# Patient Record
Sex: Female | Born: 1935 | Race: White | Hispanic: No
Health system: Southern US, Community
[De-identification: ages and names within clinical notes are randomized; demographics above are authoritative.]

## PROBLEM LIST (undated history)

## (undated) ENCOUNTER — Emergency Department (HOSPITAL_COMMUNITY): Admission: EM | Payer: Medicare Other | Source: Home / Self Care

## (undated) DIAGNOSIS — R55 Syncope and collapse: Secondary | ICD-10-CM

## (undated) DIAGNOSIS — E042 Nontoxic multinodular goiter: Secondary | ICD-10-CM

## (undated) DIAGNOSIS — M1711 Unilateral primary osteoarthritis, right knee: Secondary | ICD-10-CM

## (undated) DIAGNOSIS — E039 Hypothyroidism, unspecified: Secondary | ICD-10-CM

## (undated) DIAGNOSIS — K219 Gastro-esophageal reflux disease without esophagitis: Secondary | ICD-10-CM

## (undated) DIAGNOSIS — C801 Malignant (primary) neoplasm, unspecified: Secondary | ICD-10-CM

## (undated) DIAGNOSIS — E063 Autoimmune thyroiditis: Secondary | ICD-10-CM

## (undated) DIAGNOSIS — G8929 Other chronic pain: Secondary | ICD-10-CM

## (undated) DIAGNOSIS — M7120 Synovial cyst of popliteal space [Baker], unspecified knee: Secondary | ICD-10-CM

## (undated) DIAGNOSIS — I10 Essential (primary) hypertension: Secondary | ICD-10-CM

## (undated) DIAGNOSIS — M1990 Unspecified osteoarthritis, unspecified site: Secondary | ICD-10-CM

## (undated) DIAGNOSIS — M19011 Primary osteoarthritis, right shoulder: Secondary | ICD-10-CM

## (undated) DIAGNOSIS — J189 Pneumonia, unspecified organism: Secondary | ICD-10-CM

## (undated) DIAGNOSIS — R569 Unspecified convulsions: Secondary | ICD-10-CM

## (undated) DIAGNOSIS — Z9289 Personal history of other medical treatment: Secondary | ICD-10-CM

## (undated) DIAGNOSIS — D649 Anemia, unspecified: Secondary | ICD-10-CM

## (undated) DIAGNOSIS — M542 Cervicalgia: Secondary | ICD-10-CM

## (undated) DIAGNOSIS — E785 Hyperlipidemia, unspecified: Secondary | ICD-10-CM

## (undated) DIAGNOSIS — M549 Dorsalgia, unspecified: Secondary | ICD-10-CM

## (undated) DIAGNOSIS — I779 Disorder of arteries and arterioles, unspecified: Secondary | ICD-10-CM

## (undated) DIAGNOSIS — I951 Orthostatic hypotension: Secondary | ICD-10-CM

## (undated) HISTORY — PX: ABDOMINAL HYSTERECTOMY: SHX81

## (undated) HISTORY — DX: Anemia, unspecified: D64.9

## (undated) HISTORY — PX: COLONOSCOPY: SHX174

## (undated) HISTORY — PX: PROLAPSED UTERINE FIBROID LIGATION: SHX5400

## (undated) HISTORY — DX: Orthostatic hypotension: I95.1

## (undated) HISTORY — DX: Nontoxic multinodular goiter: E04.2

## (undated) HISTORY — DX: Syncope and collapse: R55

## (undated) HISTORY — DX: Gastro-esophageal reflux disease without esophagitis: K21.9

## (undated) HISTORY — PX: SHOULDER SURGERY: SHX246

## (undated) HISTORY — PX: CATARACT EXTRACTION: SUR2

## (undated) HISTORY — DX: Disorder of arteries and arterioles, unspecified: I77.9

## (undated) HISTORY — DX: Autoimmune thyroiditis: E06.3

## (undated) HISTORY — DX: Hyperlipidemia, unspecified: E78.5

## (undated) HISTORY — DX: Essential (primary) hypertension: I10

## (undated) HISTORY — DX: Unspecified osteoarthritis, unspecified site: M19.90

## (undated) HISTORY — PX: EYE SURGERY: SHX253

---

## 1989-08-31 DIAGNOSIS — Z9289 Personal history of other medical treatment: Secondary | ICD-10-CM

## 1989-08-31 HISTORY — DX: Personal history of other medical treatment: Z92.89

## 2001-05-19 ENCOUNTER — Encounter (HOSPITAL_COMMUNITY): Admission: RE | Admit: 2001-05-19 | Discharge: 2001-06-18 | Payer: Self-pay | Admitting: Family Medicine

## 2001-08-11 ENCOUNTER — Other Ambulatory Visit: Admission: RE | Admit: 2001-08-11 | Discharge: 2001-08-11 | Payer: Self-pay | Admitting: *Deleted

## 2003-04-22 ENCOUNTER — Other Ambulatory Visit: Admission: RE | Admit: 2003-04-22 | Discharge: 2003-04-22 | Payer: Self-pay | Admitting: Dermatology

## 2005-12-31 DIAGNOSIS — R55 Syncope and collapse: Secondary | ICD-10-CM

## 2005-12-31 HISTORY — DX: Syncope and collapse: R55

## 2006-08-20 ENCOUNTER — Ambulatory Visit: Payer: Self-pay | Admitting: Cardiology

## 2006-10-01 ENCOUNTER — Ambulatory Visit: Payer: Self-pay | Admitting: Cardiology

## 2007-08-15 ENCOUNTER — Emergency Department (HOSPITAL_COMMUNITY): Admission: EM | Admit: 2007-08-15 | Discharge: 2007-08-15 | Payer: Self-pay | Admitting: *Deleted

## 2008-09-10 ENCOUNTER — Emergency Department (HOSPITAL_COMMUNITY): Admission: EM | Admit: 2008-09-10 | Discharge: 2008-09-10 | Payer: Self-pay | Admitting: Emergency Medicine

## 2008-09-17 ENCOUNTER — Ambulatory Visit (HOSPITAL_COMMUNITY): Admission: RE | Admit: 2008-09-17 | Discharge: 2008-09-17 | Payer: Self-pay | Admitting: Family Medicine

## 2008-10-05 ENCOUNTER — Ambulatory Visit (HOSPITAL_COMMUNITY): Admission: RE | Admit: 2008-10-05 | Discharge: 2008-10-05 | Payer: Self-pay | Admitting: Family Medicine

## 2008-10-06 ENCOUNTER — Encounter: Admission: RE | Admit: 2008-10-06 | Discharge: 2008-10-06 | Payer: Self-pay | Admitting: Endocrinology

## 2008-10-06 ENCOUNTER — Encounter (INDEPENDENT_AMBULATORY_CARE_PROVIDER_SITE_OTHER): Payer: Self-pay | Admitting: Interventional Radiology

## 2008-10-06 ENCOUNTER — Other Ambulatory Visit: Admission: RE | Admit: 2008-10-06 | Discharge: 2008-10-06 | Payer: Self-pay | Admitting: Interventional Radiology

## 2008-12-31 DIAGNOSIS — E042 Nontoxic multinodular goiter: Secondary | ICD-10-CM

## 2008-12-31 HISTORY — DX: Nontoxic multinodular goiter: E04.2

## 2008-12-31 HISTORY — PX: TOTAL THYROIDECTOMY: SHX2547

## 2009-02-17 ENCOUNTER — Encounter: Payer: Self-pay | Admitting: Cardiology

## 2009-02-17 LAB — CONVERTED CEMR LAB
ALT: 17 units/L
Albumin: 3.3 g/dL
BUN: 16 mg/dL
Chloride: 105 meq/L
Glucose, Bld: 119 mg/dL
HCT: 34.8 %
Platelets: 235 10*3/uL
Total Protein: 5.9 g/dL

## 2009-02-22 ENCOUNTER — Encounter: Admission: RE | Admit: 2009-02-22 | Discharge: 2009-02-22 | Payer: Self-pay | Admitting: Surgery

## 2009-02-25 ENCOUNTER — Encounter (INDEPENDENT_AMBULATORY_CARE_PROVIDER_SITE_OTHER): Payer: Self-pay | Admitting: General Surgery

## 2009-02-25 ENCOUNTER — Observation Stay (HOSPITAL_COMMUNITY): Admission: RE | Admit: 2009-02-25 | Discharge: 2009-02-26 | Payer: Self-pay | Admitting: General Surgery

## 2009-03-25 ENCOUNTER — Encounter (INDEPENDENT_AMBULATORY_CARE_PROVIDER_SITE_OTHER): Payer: Self-pay

## 2009-03-25 LAB — CONVERTED CEMR LAB
AST: 14 units/L
Albumin: 3.7 g/dL
CO2: 27 meq/L
Chloride: 105 meq/L
Free T4: 0.96 ng/dL
T3, Free: 2.2 pg/mL
Total Protein: 6.6 g/dL

## 2009-05-23 ENCOUNTER — Encounter (INDEPENDENT_AMBULATORY_CARE_PROVIDER_SITE_OTHER): Payer: Self-pay

## 2009-05-23 LAB — CONVERTED CEMR LAB
T3, Free: 2.85 pg/mL
TSH: 0.4 microintl units/mL

## 2009-09-22 ENCOUNTER — Encounter (INDEPENDENT_AMBULATORY_CARE_PROVIDER_SITE_OTHER): Payer: Self-pay | Admitting: *Deleted

## 2009-09-30 ENCOUNTER — Ambulatory Visit (HOSPITAL_COMMUNITY): Admission: RE | Admit: 2009-09-30 | Discharge: 2009-09-30 | Payer: Self-pay | Admitting: Family Medicine

## 2009-10-03 ENCOUNTER — Encounter (INDEPENDENT_AMBULATORY_CARE_PROVIDER_SITE_OTHER): Payer: Self-pay

## 2009-10-03 LAB — CONVERTED CEMR LAB: Vitamin B-12: 364 pg/mL

## 2009-10-13 ENCOUNTER — Ambulatory Visit: Payer: Self-pay | Admitting: Gastroenterology

## 2009-10-13 DIAGNOSIS — R141 Gas pain: Secondary | ICD-10-CM

## 2009-10-13 DIAGNOSIS — R142 Eructation: Secondary | ICD-10-CM

## 2009-10-13 DIAGNOSIS — R1013 Epigastric pain: Secondary | ICD-10-CM | POA: Insufficient documentation

## 2009-10-13 DIAGNOSIS — R143 Flatulence: Secondary | ICD-10-CM

## 2009-11-10 ENCOUNTER — Telehealth (INDEPENDENT_AMBULATORY_CARE_PROVIDER_SITE_OTHER): Payer: Self-pay

## 2009-11-18 ENCOUNTER — Encounter: Payer: Self-pay | Admitting: Gastroenterology

## 2009-11-21 LAB — CONVERTED CEMR LAB
Basophils Relative: 1 % (ref 0–1)
Eosinophils Relative: 6 % — ABNORMAL HIGH (ref 0–5)
Hemoglobin: 12 g/dL (ref 12.0–15.0)
Lymphocytes Relative: 24 % (ref 12–46)
Lymphs Abs: 2.2 10*3/uL (ref 0.7–4.0)
MCHC: 33.1 g/dL (ref 30.0–36.0)
MCV: 89.2 fL (ref 78.0–100.0)
Monocytes Absolute: 0.6 10*3/uL (ref 0.1–1.0)
Monocytes Relative: 7 % (ref 3–12)
Neutro Abs: 5.6 10*3/uL (ref 1.7–7.7)
Neutrophils Relative %: 62 % (ref 43–77)
WBC: 8.9 10*3/uL (ref 4.0–10.5)

## 2009-11-30 ENCOUNTER — Ambulatory Visit: Payer: Self-pay | Admitting: Gastroenterology

## 2009-11-30 HISTORY — PX: ESOPHAGOGASTRODUODENOSCOPY: SHX1529

## 2009-12-02 ENCOUNTER — Ambulatory Visit: Payer: Self-pay | Admitting: Gastroenterology

## 2009-12-02 ENCOUNTER — Ambulatory Visit (HOSPITAL_COMMUNITY): Admission: RE | Admit: 2009-12-02 | Discharge: 2009-12-02 | Payer: Self-pay | Admitting: Gastroenterology

## 2009-12-09 ENCOUNTER — Encounter: Payer: Self-pay | Admitting: Gastroenterology

## 2009-12-16 ENCOUNTER — Encounter: Payer: Self-pay | Admitting: Gastroenterology

## 2009-12-19 ENCOUNTER — Encounter: Payer: Self-pay | Admitting: Gastroenterology

## 2009-12-27 ENCOUNTER — Encounter: Payer: Self-pay | Admitting: Gastroenterology

## 2010-02-13 ENCOUNTER — Encounter (INDEPENDENT_AMBULATORY_CARE_PROVIDER_SITE_OTHER): Payer: Self-pay

## 2010-02-13 LAB — CONVERTED CEMR LAB
BUN: 21 mg/dL
Chloride: 105 meq/L
Free T4: 0.95 ng/dL
HCT: 36.5 %
Hemoglobin: 11.3 g/dL
Platelets: 268 10*3/uL
Saturation Ratios: 43 %
Sodium: 141 meq/L
TIBC: 272 ug/dL
UIBC: 190 ug/dL

## 2010-03-03 ENCOUNTER — Ambulatory Visit: Payer: Self-pay | Admitting: Gastroenterology

## 2010-05-08 ENCOUNTER — Ambulatory Visit: Payer: Self-pay | Admitting: Cardiology

## 2010-05-08 DIAGNOSIS — K219 Gastro-esophageal reflux disease without esophagitis: Secondary | ICD-10-CM

## 2010-05-08 DIAGNOSIS — J45909 Unspecified asthma, uncomplicated: Secondary | ICD-10-CM | POA: Insufficient documentation

## 2010-05-08 DIAGNOSIS — Z9089 Acquired absence of other organs: Secondary | ICD-10-CM | POA: Insufficient documentation

## 2010-05-09 ENCOUNTER — Encounter: Payer: Self-pay | Admitting: Cardiology

## 2010-05-16 ENCOUNTER — Encounter: Payer: Self-pay | Admitting: Cardiology

## 2010-06-05 ENCOUNTER — Encounter: Admission: RE | Admit: 2010-06-05 | Discharge: 2010-06-05 | Payer: Self-pay | Admitting: Orthopedic Surgery

## 2010-06-07 ENCOUNTER — Ambulatory Visit: Payer: Self-pay | Admitting: Gastroenterology

## 2010-06-07 DIAGNOSIS — R1319 Other dysphagia: Secondary | ICD-10-CM

## 2010-06-09 ENCOUNTER — Ambulatory Visit (HOSPITAL_COMMUNITY): Admission: RE | Admit: 2010-06-09 | Discharge: 2010-06-09 | Payer: Self-pay | Admitting: Gastroenterology

## 2010-06-15 ENCOUNTER — Encounter (INDEPENDENT_AMBULATORY_CARE_PROVIDER_SITE_OTHER): Payer: Self-pay

## 2010-06-18 ENCOUNTER — Emergency Department (HOSPITAL_COMMUNITY): Admission: EM | Admit: 2010-06-18 | Discharge: 2010-06-18 | Payer: Self-pay | Admitting: Emergency Medicine

## 2010-07-11 ENCOUNTER — Inpatient Hospital Stay (HOSPITAL_COMMUNITY): Admission: RE | Admit: 2010-07-11 | Discharge: 2010-07-13 | Payer: Self-pay | Admitting: Orthopedic Surgery

## 2010-08-31 ENCOUNTER — Encounter (HOSPITAL_COMMUNITY): Admission: RE | Admit: 2010-08-31 | Discharge: 2010-09-29 | Payer: Self-pay | Admitting: Orthopedic Surgery

## 2010-10-04 ENCOUNTER — Encounter (HOSPITAL_COMMUNITY)
Admission: RE | Admit: 2010-10-04 | Discharge: 2010-11-03 | Payer: Self-pay | Source: Home / Self Care | Admitting: Orthopedic Surgery

## 2010-11-07 ENCOUNTER — Ambulatory Visit: Payer: Self-pay | Admitting: Gastroenterology

## 2011-01-21 ENCOUNTER — Encounter: Payer: Self-pay | Admitting: General Surgery

## 2011-01-21 ENCOUNTER — Encounter: Payer: Self-pay | Admitting: Endocrinology

## 2011-02-01 NOTE — Assessment & Plan Note (Signed)
Summary: DYSPHAGIA, GERD   Visit Type:  Follow-up Visit Primary Care Provider:  Phillips Odor, M.D.  Chief Complaint:  dysphagia.  History of Present Illness: Swallowing is pretty good. Still is careful about swallowing. Works all the time. No vomiting. No blood in stool or black tarry stools. Daughter had a bug. Spent most of the day cleaning off things with Lysol. Feeling queasy since Sat  off and on. Has congestion and runny nose. No fever. BMs: 1-2x.day, no diarrhea. Uses Tums as needed for indigestion especially under stress. DEXILANT helps to control her Sx.  Allergies: 1)  ! Celebrex 2)  ! Augmentin  Past History:  Past Medical History: Last updated: 06/07/2010 Near syncope-2007 Hypertension Asthma Left shoulder pain, C-spine disease Arthritis Hiatal hernia/GERD; distal esophageal web requiring dilatation; gastric polyps; gastritis; refuses colonoscopy LYMPHOCYTIC THYROIDITIS AND ADENOMATOUS NODULES-thyroidectomy in 2010  Past Surgical History: Last updated: 05/08/2010 Hysterectomy for fibroids Repair of uterine prolapse/rectocele, cystocele Total thyroidectomy-2010  Social History: Marital Status: widowed, husband died in 11-13-09 HAS A HANDICAPPED DAUGHTER. 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 Head:  Normocephalic and atraumatic. Lungs:  Clear throughout to auscultation. Heart:  Regular rate and rhythm; no murmurs. Abdomen:  Soft, nontender and nondistended. Normal bowel sounds. Neurologic:  Alert and  oriented  x4;  grossly normal neurologically.  Impression & Recommendations:  Problem # 1:  OTHER DYSPHAGIA (ICD-787.29) Assessment Unchanged 2o to cervical disc disease. Pt modifying her diet. OPV IN 12 MOS.  Problem # 2:  GASTROESOPHAGEAL REFLUX DISEASE (ICD-530.81) Assessment: Unchanged Sx controlled. Refilled Dexilant for one year.  CC: PCP Prescriptions: DEXILANT 60 MG CPDR (DEXLANSOPRAZOLE) one by mouth every morning  #90 x 3   Entered and Authorized by:   West Bali MD   Signed by:   West Bali MD on 11/07/2010   Method used:   Print then Give to Patient   RxID:   [id number]   Appended Document: Orders Update    Clinical Lists Changes  Orders: Added new Service order of Est. Patient Level II (14782) - Signed      Appended Document: DYSPHAGIA, GERD 77YR OPV F/U IS IN THE COMPUTER

## 2011-02-01 NOTE — Letter (Signed)
Summary: BPE ORDER  BPE ORDER   Imported By: Ave Filter 06/07/2010 11:23:14  _____________________________________________________________________  External Attachment:    Type:   Image     Comment:   External Document

## 2011-02-01 NOTE — Miscellaneous (Signed)
**Note De-Identified Christine Leblanc Obfuscation** Summary: CBC, Iron, BMET, T4, T3, TSH and B12 (02-13-10, 10-03-09, 05-23-09   Clinical Lists Changes  Observations: Added new observation of IRON SATUR %: 43 % (02/13/2010 15:20) Added new observation of TIBC: 272 mcg/dL (41/32/4401 02:72) Added new observation of UIBC: 190 mcg/dL (53/66/4403 47:42) Added new observation of IRON: 82 mcg/dL (59/56/3875 64:33) Added new observation of CALCIUM: 8.9 mg/dL (29/51/8841 66:06) Added new observation of CO2 PLSM/SER: 29 meq/L (02/13/2010 15:20) Added new observation of CL SERUM: 105 meq/L (02/13/2010 15:20) Added new observation of K SERUM: 3.9 meq/L (02/13/2010 15:20) Added new observation of NA: 141 meq/L (02/13/2010 15:20) Added new observation of TSH: 2.98 microintl units/mL (02/13/2010 15:20) Added new observation of T4, FREE: 0.95 ng/dL (30/16/0109 32:35) Added new observation of T3 FREE: 2.27 pg/mL (02/13/2010 15:20) Added new observation of CREATININE: 0.7 mg/dL (57/32/2025 42:70) Added new observation of BUN: 21 mg/dL (62/37/6283 15:17) Added new observation of BG RANDOM: 110 mg/dL (61/60/7371 06:26) Added new observation of PLATELETK/UL: 268 K/uL (02/13/2010 15:20) Added new observation of MCV: 92.9 fL (02/13/2010 15:20) Added new observation of HCT: 36.5 % (02/13/2010 15:20) Added new observation of HGB: 11.3 g/dL (94/85/4627 03:50) Added new observation of TSH: 0.32 microintl units/mL (10/03/2009 15:20) Added new observation of T4, FREE: 1.25 ng/dL (09/38/1829 93:71) Added new observation of T3 FREE: 2.46 pg/mL (10/03/2009 15:20) Added new observation of B12: 364 pg/mL (10/03/2009 15:20) Added new observation of CREATININE: 0.7 mg/dL (69/67/8938 10:17) Added new observation of BUN: 21 mg/dL (51/01/5851 77:82) Added new observation of BG RANDOM: 119 mg/dL (42/35/3614 43:15) Added new observation of CO2 PLSM/SER: 24 meq/L (10/03/2009 15:20) Added new observation of CL SERUM: 105 meq/L (10/03/2009 15:20) Added new observation of K  SERUM: 3.9 meq/L (10/03/2009 15:20) Added new observation of NA: 141 meq/L (10/03/2009 15:20) Added new observation of TSH: 0.40 microintl units/mL (05/23/2009 15:20) Added new observation of T4, FREE: 1.53 ng/dL (40/07/6760 95:09) Added new observation of T3 FREE: 2.85 pg/mL (05/23/2009 15:20) Added new observation of CALCIUM: 9.0 mg/dL (32/67/1245 80:99) Added new observation of ALBUMIN: 3.7 g/dL (83/38/2505 39:76) Added new observation of PROTEIN, TOT: 6.6 g/dL (73/41/9379 02:40) Added new observation of SGPT (ALT): 12 units/L (03/25/2009 15:20) Added new observation of SGOT (AST): 14 units/L (03/25/2009 15:20) Added new observation of ALK PHOS: 53 units/L (03/25/2009 15:20) Added new observation of BILI DIRECT: BILI Total: 0.4 mg/dL (97/35/3299 24:26) Added new observation of CREATININE: 0.7 mg/dL (83/41/9622 29:79) Added new observation of BUN: 21 mg/dL (89/21/1941 74:08) Added new observation of BG RANDOM: 113 mg/dL (14/48/1856 31:49) Added new observation of CO2 PLSM/SER: 27 meq/L (03/25/2009 15:20) Added new observation of CL SERUM: 105 meq/L (03/25/2009 15:20) Added new observation of K SERUM: 3.8 meq/L (03/25/2009 15:20) Added new observation of NA: 141 meq/L (03/25/2009 15:20) Added new observation of TSH: 7.46 microintl units/mL (03/25/2009 15:20) Added new observation of T4, FREE: 0.96 ng/dL (70/26/3785 88:50) Added new observation of T3 FREE: 2.20 pg/mL (03/25/2009 15:20)

## 2011-02-01 NOTE — Letter (Signed)
Summary: [facility] PROGRESS NOTE   OPTHALMOLOGY PROGRESS NOTE   Imported By: Faythe Ghee 05/16/2010 16:39:56  _____________________________________________________________________  External Attachment:    Type:   Image     Comment:   External Document

## 2011-02-01 NOTE — Assessment & Plan Note (Signed)
Summary: F3Y-WANT TO BE CHECKED   Visit Type:  Follow-up Referring Provider:  . Primary Provider:  Nobie Leblanc   History of Present Illness: [name] is seen at her request after a hiatus of nearly 4 years for evaluation of left arm pain, EKG abnormalities and her concern regarding possible cerebrovascular disease.  She actually has done quite well with good exercise tolerance, no chest discomfort and no dyspnea.  During a recent evaluation by her ophthalmologist, she was asked about possible CVA and wondered whether that indicated that she had vascular disease.  She also has been told of concerns about her EKG in the past.  She has taken Premarin for decades and was told by her gynecologist that women who have done so can rarely discontinue it comfortably.  Her principal medical problem has been chronic discomfort in her left shoulder.  She has been evaluated by Dr. Thurston Hole, who has referred her to a shoulder specialist, Dr. Dion Saucier, who she has not yet seen.  She recently has had symptoms consistent with a upper respiratory infection, which she characterizes as a sinus infection.  EKG  Procedure date:  05/08/2010  Findings:      Normal sinus rhythm Biatrial enlargement Nondiagnostic inferior Q waves J-Point elevation consistent with early repolarization Comparison with prior tracing of 10/15/08, axis is shifted to the right; left and right atrial abnormalities now present; increased QRS voltage; inferior Q waves now present.   Current Medications (verified): 1)  Synthroid 112 Mcg Tabs (Levothyroxine Sodium) .... Take 1 Tablet By Mouth Once A Day Except Sunday 2)  Albuterol Sulfate 4 Mg Tabs (Albuterol Sulfate) .... Take 1 Tablet By Mouth Two Times A Day As Needed 3)  Proventil Hfa 108 (90 Base) Mcg/act Aers (Albuterol Sulfate) .... As Directed 4)  Allergy Shots .... Once Weekly 5)  Glucosamine .... Take 1 Tablet By Mouth Two Times A Day 6)  Allegra 180 Mg Tabs (Fexofenadine Hcl)  .... Take 1 Tablet By Mouth Once A Day As Needed 7)  Norvasc 5 Mg Tabs (Amlodipine Besylate) .... Take 1 Tablet By Mouth Once A Day 8)  Asa 81 Mg .... Take 1 Tablet By Mouth Once A Day 9)  Calcium 500 Plus D .... Take 1 Tablet By Mouth Two Times A Day 10)  Vit E 400 Iu .... One Tablet Daily 11)  Peri-Colace .... Two Tablets At Bedtime 12)  Benadryl 25 Mg Caps (Diphenhydramine Hcl) .... One Tablet At Bedtime As Needed 13)  Clonazepam 0.5 Mg Tabs (Clonazepam) .... One Tablet At Bedtime As Needed 14)  Valium 5 Mg Tabs (Diazepam) .... 1/2 Tablet At Bedtime As Needed 15)  Fish Oil 1200 Mg .... Take 1 Tablet By Mouth Two Times A Day 16)  Dexilant 60 Mg Cpdr (Dexlansoprazole) .... One By Mouth Every Morning 17)  Vitamin E .... Once Daily 18)  Niacin .... Once Daily 19)  Vitamin B 6 .... Take 1 Tablet By Mouth Once A Day 20)  Rezyst Im  Chew (Probiotic Product) .... One By Mouth Daily 21)  Vitamin B-12 500 Mcg Tabs (Cyanocobalamin) .... Take 1 Tab Daily  Allergies (verified): 1)  ! Celebrex 2)  ! Augmentin  Past History:  Past Medical History: Last updated: 05/08/2010 Near syncope-2007 Hypertension Asthma Left shoulder pain, C-spine disease Arthritis Hiatal hernia/GERD; distal esophageal web requiring dilatation; gastric polyps; gastritis; refuses colonoscopy LYMPHOCYTIC THYROIDITIS AND ADENOMATOUS NODULES-thyroidectomy in 2010  Past Surgical History: Last updated: 05/08/2010 Hysterectomy for fibroids Repair of uterine prolapse/rectocele, cystocele  Total thyroidectomy-2010  Family History: Last updated: 10/13/2009 Mother, gallbladder dz, heart problems Sister, breast cancer, doing well No FH of Colon Cancer:  Social History: Last updated: 05/08/2010 Marital Status: widowed, husband died in 2009-12-04 Children: 4, 1  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    Vital Signs:  Patient profile:   77 year old female Weight:      139 pounds Pulse rate:   92 / minute BP sitting:   141 / 78  (right arm)  Vitals Entered By: Dreama Saa, CNA (May 08, 2010 2:32 PM)  Physical Exam  General:    Proportionate weight and height; well developed; no acute distress:   Neck-No JVD; no carotid bruits: Lungs-No tachypnea, no rales; no rhonchi; no wheezes; frequent nonproductive cough Cardiovascular-normal PMI; normal S1 and S2; modest basilar systolic ejection murmur Abdomen-BS normal; soft and non-tender without masses or organomegaly:  Musculoskeletal-No deformities, no cyanosis or clubbing: Neurologic-Normal cranial nerves; symmetric strength and tone:  Skin-Warm, no significant lesions: Extremities-1+ posterior tibial pulses and 1-2+ dorsalis pedis; no edema:     Impression & Recommendations:  Problem # 1:  HYPERTENSION (ICD-401.1) Blood pressure control is excellent on a single medication.  It appears that her mild hypertension is well treated and not causing any additional mobidity.  Problem # 2:  GASTROESOPHAGEAL REFLUX DISEASE (ICD-530.81) Symptoms are adequately controlled with a PPI.  She required esophageal dilatation in the past, but currently has no dysphasia.  Problem # 3:  THYROIDECTOMY, HX OF (ICD-V45.79) Dr. Lucianne Muss manages treatment of hypothyroidism following total thyroidectomy.  Patient reports recent general laboratory studies at his office.  We will seek copies of his records and plan to reassess this nice woman in one year.  Patient Instructions: 1)  Your physician recommends that you schedule a follow-up appointment in: 1 year 2)  Your physician has recommended you make the following change in your medication:  stop premarin mahy resume if hot flashes ocur

## 2011-02-01 NOTE — Assessment & Plan Note (Signed)
Summary: PP FU/GU   Visit Type:  Follow-up Visit Primary Care Provider:  Cresenzo  Chief Complaint:  F/U gerd/abd pain.  History of Present Illness: Here for f/u EGD. EGD showed probable distal esophageal web dilated to 16-mm, small hiatal hernia, gastric polyps, mild gastritis (bx benign, no H. Pylori),  narrowing of the junction of D1 and D2, s/p dilation. Felt better when on probiotic. Dexilant helps heartburn. Does have increased gas/bloating. No n/v. No dysphagia. BM every day on Pericolace. No melena, brbpr. Epigastric "ache" with meals sometimes. Dr. Nobie Putnam asked her not to take Lodine, because "it messed up your stomach". She has been off for two days. She c/o left shoulder pain since Dec. and has had two injections. She feels she needs the Lodine, has been on it for twenty years. Weight stable since last visit.  Current Medications (verified): 1)  Synthroid 112 Mcg Tabs (Levothyroxine Sodium) .... Take 1 Tablet By Mouth Once A Day Except Sunday 2)  Premarin 0.45 Mg Tabs (Estrogens Conjugated) .... Take 1 Tablet By Mouth Once A Day 3)  Premarin 0.625 Mg/gm Crea (Estrogens, Conjugated) .... 1/2 Applicator 2-3 Times Weekly 4)  Albuterol Sulfate 4 Mg Tabs (Albuterol Sulfate) .... Take 1 Tablet By Mouth Two Times A Day As Needed 5)  Proventil Hfa 108 (90 Base) Mcg/act Aers (Albuterol Sulfate) .... As Directed 6)  Allergy Shots .... Once Weekly 7)  Glucosamine .... Take 1 Tablet By Mouth Two Times A Day 8)  Allegra 180 Mg Tabs (Fexofenadine Hcl) .... Take 1 Tablet By Mouth Once A Day As Needed 9)  Norvasc 5 Mg Tabs (Amlodipine Besylate) .... Take 1 Tablet By Mouth Once A Day 10)  Asa 81 Mg .... Take 1 Tablet By Mouth Once A Day 11)  Calcium 500 Plus D .... Take 1 Tablet By Mouth Two Times A Day 12)  Vit E 400 Iu .... One Tablet Daily 13)  Peri-Colace .... Two Tablets At Bedtime 14)  Benadryl 25 Mg Caps (Diphenhydramine Hcl) .... One Tablet At Bedtime As Needed 15)  Clonazepam 0.5 Mg  Tabs (Clonazepam) .... One Tablet At Bedtime As Needed 16)  Valium 5 Mg Tabs (Diazepam) .... 1/2 Tablet At Bedtime As Needed 17)  Fish Oil 1200 Mg .... Take 1 Tablet By Mouth Two Times A Day 18)  Dexilant 60 Mg Cpdr (Dexlansoprazole) .... One By Mouth Every Morning 19)  Vitamin E .... Once Daily 20)  Vitamin B 12 .... Take 1 Tablet By Mouth Once A Day 21)  Niacin .... Once Daily 22)  Vitamin B 6 .... Take 1 Tablet By Mouth Once A Day  Allergies (verified): 1)  ! Celebrex 2)  ! Augmentin  Review of Systems      See HPI General:  Denies fatigue; complains of chronic fatigue. GU:  Denies urinary burning and blood in urine.  Vital Signs:  Patient profile:   77 year old female Weight:      141 pounds Temp:     97 .9 degrees F oral Pulse rate:   88 / minute BP sitting:   140 / 70  Physical Exam  General:  Well developed, well nourished, no acute distress. Head:  Normocephalic and atraumatic. Eyes:  Conjunctivae pink, no scleral icterus.  Mouth:  Oropharyngeal mucosa moist, pink.  No lesions, erythema or exudate.    Lungs:  Clear throughout to auscultation. Heart:  Regular rate and rhythm; no murmurs, rubs,  or bruits. Abdomen:  Bowel sounds normal.  Abdomen is soft,  nontender, nondistended.  No rebound or guarding.  No hepatosplenomegaly, masses or hernias.  No abdominal bruits.  Extremities:  No clubbing, cyanosis, edema or deformities noted. Neurologic:  Alert and  oriented x4;  grossly normal neurologically. Skin:  Intact without significant lesions or rashes. Psych:  Alert and cooperative. Normal mood and affect.  Impression & Recommendations:  Problem # 1:  EPIGASTRIC PAIN (ICD-789.06)  Chronic intermittent epigastric discomfort, indigestion, abd bloating likely due to GERD/dyspepsia. She had esophageal stricture and duodenal stricture, both dilated. For ongoing symptoms, plan was for HBT for SBBO +/- CT A/P. Patient refuses both of these. She refuses colonoscopy, last one  over fifty years ago. Her biggest concern is shoulder pain and she wants to restart her Lodine. From a GI standpoint, she can continue Lodine with Dexilant.   Will add back probiotic. She will call if she decides to pursue any of above testing. Otherwise, she will call with worsening symptoms. OV in 3 months with Dr. Darrick Penna.   Orders: Est. Patient Level II (40981)  Patient Instructions: 1)  You can take Lodine as long as you stay on Dexilant. 2)  Eat yogurt twice a day, Yoplait and Activia are good options.  3)  Rezyst one by mouth daily. New prescriptions provided. 4)  Please call with further GI problems or if you decide to do additional tests Dr. Darrick Penna has recommended. 5)  Please schedule a follow-up appointment in 3 months with Dr. Darrick Penna. 6)  The medication list was reviewed and reconciled.  All changed / newly prescribed medications were explained.  A complete medication list was provided to the patient / caregiver. Prescriptions: REZYST IM  CHEW (PROBIOTIC PRODUCT) one by mouth daily  #30 x 1   Entered and Authorized by:   Leanna Battles. Dixon Boos   Signed by:   Leanna Battles Paulanthony Gleaves PA-C on 03/03/2010   Method used:   Print then Give to Patient   RxID:   [id number]

## 2011-02-01 NOTE — Assessment & Plan Note (Signed)
Summary: DYPSHAGIA, GERD    Visit Type:  Follow-up Visit Primary Care Provider:  Phillips Odor, M.D.  Chief Complaint:  follow up.  History of Present Illness: Hurting in left shoulder. Possible surgery in July 2011. Swallowing "not good"-problems pills, stil cutting meat very small. Big dentures cause problems with swallowing. No CP, SOB, nausea, or vomiting. Has mild asthma. Rare abd pain. Has problems with gas. Ice cream 3 times plus Frosty's. Takes calcium and doesn't drink milk. Cheese: 1-2x/week. Has tried probiotic yogurt. Life is hectic lately. Having problems with back to back home repairs.  Current Medications (verified): 1)  Synthroid 112 Mcg Tabs (Levothyroxine Sodium) .... Take 1 Tablet By Mouth Once A Day Except Sunday 2)  Albuterol Sulfate 4 Mg Tabs (Albuterol Sulfate) .... Take 1 Tablet By Mouth Two Times A Day As Needed 3)  Proventil Hfa 108 (90 Base) Mcg/act Aers (Albuterol Sulfate) .... As Directed 4)  Allergy Shots .... Once Weekly 5)  Glucosamine .... Take 1 Tablet By Mouth Two Times A Day 6)  Allegra 180 Mg Tabs (Fexofenadine Hcl) .... Take 1 Tablet By Mouth Once A Day As Needed 7)  Norvasc 5 Mg Tabs (Amlodipine Besylate) .... Take 1 Tablet By Mouth Once A Day 8)  Asa 81 Mg .... Take 1 Tablet By Mouth Once A Day 9)  Calcium 500 Plus D .... Take 1 Tablet By Mouth Two Times A Day 10)  Vit E 400 Iu .... One Tablet Daily 11)  Peri-Colace .... Two Tablets At Bedtime 12)  Benadryl 25 Mg Caps (Diphenhydramine Hcl) .... One Tablet At Bedtime As Needed 13)  Clonazepam 0.5 Mg Tabs (Clonazepam) .... One Tablet At Bedtime As Needed 14)  Valium 5 Mg Tabs (Diazepam) .... 1/2 Tablet At Bedtime As Needed 15)  Fish Oil 1200 Mg .... Take 1 Tablet By Mouth Two Times A Day 16)  Dexilant 60 Mg Cpdr (Dexlansoprazole) .... One By Mouth Every Morning 17)  Vitamin E .... Once Daily 18)  Niacin .... Once Daily 19)  Vitamin B 6 .... Take 1 Tablet By Mouth Once A Day 20)  Vitamin B-12 500 Mcg Tabs  (Cyanocobalamin) .... Take 1 Tab Daily 21)  Xalatan 0.005 % Soln (Latanoprost) .... Once Daily  Allergies (verified): 1)  ! Celebrex 2)  ! Augmentin  Past History:  Past Surgical History: Last updated: 05/08/2010 Hysterectomy for fibroids Repair of uterine prolapse/rectocele, cystocele Total thyroidectomy-2010  Past Medical History: Near syncope-2007 Hypertension Asthma Left shoulder pain, C-spine disease Arthritis Hiatal hernia/GERD; distal esophageal web requiring dilatation; gastric polyps; gastritis; refuses colonoscopy LYMPHOCYTIC THYROIDITIS AND ADENOMATOUS NODULES-thyroidectomy in 2010  Vital Signs:  Patient profile:   77 year old female Height:      62 inches Weight:      138 pounds BMI:     25.33 Temp:     98 .0 degrees F oral Pulse rate:   88 / minute BP sitting:   124 / 70  (right arm) Cuff size:   regular  Vitals Entered By: Hendricks Limes LPN (June 07, 1609 10:36 AM)  Physical Exam  General:  Well developed, well nourished, no acute distress. Head:  Normocephalic and atraumatic. Lungs:  Clear throughout to auscultation. Heart:  Regular rate and rhythm; no murmurs. Abdomen:  Soft, nontender and nondistended. Normal bowel sounds. Msk:  LROM IN LEFT SHOULDER Extremities:  No edema noted.  Impression & Recommendations:  Problem # 1:  OTHER DYSPHAGIA (ICD-787.29) Assessment Improved  slightly but swallowing difficulties persist with pills and  solids. BPE. Consider repeat EGD/dilation. OPV in 6 mos. Continue Dexilant.  LIII  CC: PCP  Orders: Est. Patient Level III (16109) Prescriptions: DEXILANT 60 MG CPDR (DEXLANSOPRAZOLE) one by mouth every morning  #90 x 3   Entered and Authorized by:   West Bali MD   Signed by:   West Bali MD on 06/07/2010   Method used:   Print then Give to Patient   RxID:   [id number]   Appended Document: DYPSHAGIA, GERD  REMINDER IN COMPUTER

## 2011-03-18 LAB — BASIC METABOLIC PANEL
BUN: 17 mg/dL (ref 6–23)
CO2: 25 mEq/L (ref 19–32)
Calcium: 8.5 mg/dL (ref 8.4–10.5)
Calcium: 8.6 mg/dL (ref 8.4–10.5)
Creatinine, Ser: 0.7 mg/dL (ref 0.4–1.2)
GFR calc Af Amer: >60 mL/min (ref >60)
GFR calc Af Amer: >60 mL/min (ref >60)
GFR calc Af Amer: >60 mL/min (ref >60)
GFR calc non Af Amer: >60 mL/min (ref >60)
Glucose, Bld: 100 mg/dL — ABNORMAL HIGH (ref 70–99)
Glucose, Bld: 132 mg/dL — ABNORMAL HIGH (ref 70–99)
Potassium: 4.2 mEq/L (ref 3.5–5.1)
Sodium: 138 mEq/L (ref 135–145)
Sodium: 139 mEq/L (ref 135–145)
Sodium: 139 mEq/L (ref 135–145)

## 2011-03-18 LAB — TYPE AND SCREEN: Antibody Screen: NEGATIVE

## 2011-03-18 LAB — CBC
Hemoglobin: 8.9 g/dL — ABNORMAL LOW (ref 12.0–15.0)
Hemoglobin: 9.4 g/dL — ABNORMAL LOW (ref 12.0–15.0)
MCH: 31.6 pg (ref 26.0–34.0)
MCH: 32.1 pg (ref 26.0–34.0)
MCHC: 33.8 g/dL (ref 30.0–36.0)
MCHC: 34.4 g/dL (ref 30.0–36.0)
MCV: 93.1 fL (ref 78.0–100.0)
Platelets: 181 10*3/uL (ref 150–400)
RBC: 2.83 MIL/uL — ABNORMAL LOW (ref 3.87–5.11)
RBC: 2.98 MIL/uL — ABNORMAL LOW (ref 3.87–5.11)
RBC: 3.75 MIL/uL — ABNORMAL LOW (ref 3.87–5.11)
WBC: 10.1 10*3/uL (ref 4.0–10.5)
WBC: 8.8 10*3/uL (ref 4.0–10.5)

## 2011-04-17 LAB — DIFFERENTIAL
Basophils Absolute: 0.1 10*3/uL (ref 0.0–0.1)
Basophils Relative: 1 % (ref 0–1)
Lymphocytes Relative: 24 % (ref 12–46)
Neutro Abs: 4.4 10*3/uL (ref 1.7–7.7)
Neutrophils Relative %: 61 % (ref 43–77)

## 2011-04-17 LAB — CBC
HCT: 34.8 % — ABNORMAL LOW (ref 36.0–46.0)
Hemoglobin: 11.7 g/dL — ABNORMAL LOW (ref 12.0–15.0)
MCV: 91.1 fL (ref 78.0–100.0)
Platelets: 235 10*3/uL (ref 150–400)
RBC: 3.81 MIL/uL — ABNORMAL LOW (ref 3.87–5.11)
WBC: 7.2 10*3/uL (ref 4.0–10.5)

## 2011-04-17 LAB — COMPREHENSIVE METABOLIC PANEL
Alkaline Phosphatase: 52 U/L (ref 39–117)
BUN: 16 mg/dL (ref 6–23)
CO2: 26 mEq/L (ref 19–32)
Chloride: 105 mEq/L (ref 96–112)
Creatinine, Ser: 0.62 mg/dL (ref 0.4–1.2)
GFR calc non Af Amer: >60 mL/min (ref >60)
Glucose, Bld: 119 mg/dL — ABNORMAL HIGH (ref 70–99)
Total Bilirubin: 0.6 mg/dL (ref 0.3–1.2)

## 2011-04-17 LAB — CALCIUM: Calcium: 9 mg/dL (ref 8.4–10.5)

## 2011-04-27 ENCOUNTER — Ambulatory Visit (INDEPENDENT_AMBULATORY_CARE_PROVIDER_SITE_OTHER): Payer: Medicare Other | Admitting: Adult Health

## 2011-04-27 ENCOUNTER — Encounter: Payer: Self-pay | Admitting: Adult Health

## 2011-04-27 DIAGNOSIS — M79604 Pain in right leg: Secondary | ICD-10-CM | POA: Insufficient documentation

## 2011-04-27 DIAGNOSIS — M79609 Pain in unspecified limb: Secondary | ICD-10-CM

## 2011-04-27 DIAGNOSIS — I10 Essential (primary) hypertension: Secondary | ICD-10-CM

## 2011-04-27 DIAGNOSIS — E7800 Pure hypercholesterolemia, unspecified: Secondary | ICD-10-CM

## 2011-04-27 DIAGNOSIS — M79606 Pain in leg, unspecified: Secondary | ICD-10-CM

## 2011-04-27 DIAGNOSIS — E785 Hyperlipidemia, unspecified: Secondary | ICD-10-CM

## 2011-04-27 HISTORY — DX: Hyperlipidemia, unspecified: E78.5

## 2011-04-27 NOTE — Assessment & Plan Note (Signed)
She apparently has had a history of this, but no recent labs. She had been on lipitor in the past, but not now.  She does not follow her PCP often. She will have lipids checked along with other labs.  Since she is not on a statin, I do not believe myalgias are causing above pain in her legs.

## 2011-04-27 NOTE — Patient Instructions (Signed)
**Note De-Identified Kallyn Demarcus Obfuscation** Your physician has recommended you make the following change in your medication: stop taking Benedryl (Valium is ok).  Your physician has recommended you wear support hose as directed.  Your physician recommends that you return for lab work in: next week, do not eat or drink after midnight the night before labs are drawn.  Your physician recommends that you schedule a follow-up appointment in: 1 year (please follow up with Dr. Regino Schultze concerning MRI of back.
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
## 2011-04-27 NOTE — Assessment & Plan Note (Addendum)
On review of her medications I find that she is taking benadryl to help with sleep.  I have advised her to stop taking this as it has been found to cause restless legs and cramping in some people.  With respect to varicosities, I have advised her to wear support hose to assist with some leg discomfort as a result of the varicose veins.  I have advised her to see Dr. Oletta Cohn for evaluation of need to X-ray lumbar/sacral spine for abnormalities contributing to her symptoms.    On assessment she had great pulses, and no edema.  She verbalized understanding. I will check BMET and Mg level.

## 2011-05-01 ENCOUNTER — Other Ambulatory Visit: Payer: Self-pay | Admitting: Adult Health

## 2011-05-02 LAB — BASIC METABOLIC PANEL
BUN: 18 mg/dL (ref 6–23)
CO2: 27 mEq/L (ref 19–32)
Calcium: 9.2 mg/dL (ref 8.4–10.5)
Chloride: 106 mEq/L (ref 96–112)
Creat: 0.76 mg/dL (ref 0.40–1.20)
Glucose, Bld: 85 mg/dL (ref 70–99)
Potassium: 4.3 mEq/L (ref 3.5–5.3)
Sodium: 142 mEq/L (ref 135–145)

## 2011-05-02 LAB — LIPID PANEL
HDL: 60 mg/dL (ref >39)
LDL Cholesterol: 155 mg/dL — ABNORMAL HIGH (ref 0–99)
Total CHOL/HDL Ratio: 3.8 Ratio
Triglycerides: 62 mg/dL (ref <150)
VLDL: 12 mg/dL (ref 0–40)

## 2011-05-02 LAB — MAGNESIUM: Magnesium: 1.8 mg/dL (ref 1.5–2.5)

## 2011-05-04 ENCOUNTER — Telehealth: Payer: Self-pay

## 2011-05-04 DIAGNOSIS — E785 Hyperlipidemia, unspecified: Secondary | ICD-10-CM

## 2011-05-04 MED ORDER — PRAVASTATIN SODIUM 40 MG PO TABS: 40.0000 mg | ORAL_TABLET | Freq: Every day | ORAL | Status: DC

## 2011-05-04 NOTE — Telephone Encounter (Signed)
Message copied by Waynette Buttery on Fri May 04, 2011  1:51 PM ------      Message from: Joni Reining      Created: Thu May 03, 2011  4:24 PM       Begin Pravachol 40mg  at [facility].  Follow-up lipids and LFT's in 6 weeks. Low cholesterol diet instructions.                  Joni Reining NP

## 2011-05-15 NOTE — Op Note (Signed)
NAME:  Christine Leblanc, MCARTHY NO.:  [id number]   MEDICAL RECORD NO.:  [id number]          PATIENT TYPE:  INP   LOCATION:  0002                         FACILITY:  [facility]   PHYSICIAN:  Lennie Muckle, MD      DATE OF BIRTH:  1929/12/14   DATE OF PROCEDURE:  02/25/2009  DATE OF DISCHARGE:                               OPERATIVE REPORT   PREOPERATIVE DIAGNOSIS:  Left thyroid nodule, follicular cells unable to  rule out carcinoma.   I discussed with Ms. Holsworth I thought she needed a left, if not  complete thyroidectomy.  She had some hesitation initially.  I had sent  her to Velora Heckler, MD for a second opinion.  After discussion with  him, she desired to proceed with a total thyroidectomy to reduce chance  of having to have repeat surgery in the future.  Informed consent was  obtained prior to the procedure.  The risk of the surgery including, but  not limited to bleeding, infection, recurrent laryngeal nerve injury  were explained to Ms. Havener and she understood.   DETAILS OF PROCEDURE:  She was identified in preoperative holding area.  She was given a gram of Kefzol and was taken to the operating room.  Once in the operating room, placed in the supine position.  After  administration of general endotracheal anesthesia she had towel roll  placed between her shoulder blades.  Her anterior neck was prepped and  draped in the usual sterile fashion.  A time-out indicating the patient  and the procedure to be performed.  A incision was placed approximately  two fingerbreadths above the sternal notch.  I placed approximately 3.5  cm on either side of the midline.  After dividing the skin with the #15  blade the subcutaneous tissue was divided with electrocautery.  I  divided the platysma muscle and created flaps inferior and superiorly  with electrocautery.  A small anterior vein was ligated on the left  side.  After creating the flaps we placed in Horner  retractor into  the  wound bed.  Divided the strap muscles midline with electrocautery.  I  began dissecting on the left side, dissecting the strap muscles away  from the thyroid lobe.  Using blunt dissection I was able to isolate the  superior pole.  Clips were placed on the superior pole vessels.  The  Harmonic scalpel was used to divide those vessels.  We then clipped and  divided the middle vessels as well.  The inferior pole was divided in  the same fashion.  We continued dissecting and retracting the thyroid  gland off of the trachea.  We were able to save the parathyroid glands  on the left.  We continued dissecting and we were able to see the  recurrent laryngeal nerve on the left.  Care was noted to keep this away  from the operative field.  A small piece of thyroid tissue was left on  the trachea..  We then continued dissecting towards the midline and the  cricoid cartilage.  The isthmus was able to  be fully identified.  After  fully dissecting off the left lobe of the thyroid, proceeded with the  dissection on the right.  The strap muscles were elevated off the  thyroid gland using gentle dissection, isolated the superior pole.  The  vessels were clipped and ligated with the Harmonic scalpel continued.  Continued dissecting inferiorly and was able to isolate the lobe of the  Zuckerkandl.  Immediately in this vicinity the recurrent laryngeal nerve  was seen.  Using blunt dissection this was separated from the  surrounding and adhesive tissue, gently dissecting the thyroid away from  the nerve.  Then clipped and divided the medial and inferior poles.  The  thyroid gland was completely taken off the trachea.  A stitch marked the  left superior pole.  Ray-Tec were placed in the wound beds.  The  specimen was marked off the field.  The wound bed was irrigated.  There  was no evidence of bleeding.  I then placed a small piece of Surgicel  within the wound bed.  The strap muscles were reapproximated  using  interrupted 3-0 Vicryl suture.  The platysma muscle was reapproximated  using interrupted 3-0 Vicryl.  Monocryl was used for the skin and Steri-  Strips as final dressing.  The patient was extubated and transported to  the postanesthesia care unit in stable condition.   She will be monitored overnight.  Check a calcium level and be sent home  on Tums and Vicodin for pain.  To start Synthroid 75 mcg daily.  I will  have her TSH checked in approximately 1 month.      Lennie Muckle, MD  Electronically Signed     ALA/MEDQ  D:  02/25/2009  T:  02/25/2009  Job:  [id number]   cc:   Patrica Duel, M.D.  Fax: [phone]   Dorisann Frames, M.D.  Fax: [phone]

## 2011-05-18 NOTE — Letter (Signed)
August 20, 2006     Patrica Duel, MD  [address]  [place], [place]  [address]   RE:  Christine Leblanc, Christine Leblanc  MRN:  [id number]  /  DOB:  29-Jul-1929   Dear Christine Leblanc:   It was my pleasure evaluating Christine Leblanc in the office today in  consultation at your request.  As you know, this nice woman has enjoyed  generally good health, but does have a number of cardiovascular risk factors  including a positive family history.  She saw you approximately 6 weeks ago  after suffering an episode in church that was characterized by  lightheadedness, diaphoresis, nausea, and mild epigastric/lower chest  discomfort.  This occurred after she had been in a warm room for  approximately 2 hours dealing with somewhat unruly young people.  She rested  for a few hours after returning home and gradually returned to normal.  She  has not had any similar episode since that time.  She was advised to  decrease her dose of Norvasc and has monitored blood pressure at home, which  has been marginal with systolics sometimes above 140 and diastolics in the  90s at times.   PAST MEDICAL HISTORY:  Notable for hysterectomy and then repair of uterine  prolapse.  She was evaluated by Dr. Allyson Sabal in the past and underwent a stress  test in 2001 that was apparently negative.   SOCIAL HISTORY:  Retired.  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  Relatively active lifestyle for her age.  Married with 4 children.  Remote  tobacco use.  No use of alcohol.   FAMILY HISTORY:  Notably positive for coronary disease.   REVIEW OF SYSTEMS:  Occasional dizziness, the need for corrective lenses,  previous disclosure of cataracts that have not required surgery, occasional  constipation, GERD, urinary frequency and arthritis of the hands, knees and  hips.  All other systems reviewed and are negative.   PHYSICAL EXAMINATION:  GENERAL:  A pleasant garrulous woman in no acute  distress.  VITAL SIGNS:  Weight is 142, blood  pressure 160/60, heart rate 80 and  regular, respirations 16.  NECK:  No jugular venous distention.  Normal carotid upstrokes without  bruits.  HEENT:  Unable to visualize fundi.  Extraocular movements full.  Pupils  round and reactive to light.  LUNGS:  Clear.  CARDIAC:  Normal first and second heart sounds.  Modest systolic murmur.  Normal PMI.  ABDOMEN:  Soft and nontender.  No masses, no organomegaly.  EXTREMITIES:  Normal to bounding distal pulses.  No edema.  NEUROMUSCULAR:  Symmetric strength and tone.  Normal cranial nerves.  MUSCULOSKELETAL:  No joint deformities.  SKIN:  A few erythematous patches over the arms.  PSYCHIATRIC:  Alert and oriented.  Normal affect.  HEMATOLOGIC:  No adenopathy.  ENDOCRINE:  No thyromegaly.   LABORATORY DATA:  Laboratory performed in your office was negative including  CBC, chemistry profile and TSH.  Her lipid profile is somewhat suboptimal,  but not tragic.   EKG in your office is within normal limits with diffuse changes of early  repolarization.   IMPRESSION:  Christine Leblanc has done fine since this somewhat striking but  nonspecific event of 6 weeks ago.  I doubt that stress testing or  echocardiography will assist [place] in arriving at a diagnosis.  Her blood  pressure control is suboptimal since her antihypertensive regimen was  decreased.  I suggested that she resume Norvasc 5 mg daily and continue  to  monitor blood pressure.  She will be evaluated by the cardiology nurse in 1  month to reassess control of hypertension and symptoms.  If she continues to  feel well, I will not plan any additional interventions, but, of course,  would be happy to see her at any time that you deem appropriate.   Thanks for sending this nice woman to me.   Sincerely,      Gerrit Friends. Dietrich Pates, MD, [facility]   RMR/MedQ  DD:  08/20/2006  DT:  08/21/2006  Job #:  [id number]

## 2011-06-25 ENCOUNTER — Other Ambulatory Visit: Payer: Self-pay | Admitting: Adult Health

## 2011-06-26 ENCOUNTER — Encounter: Payer: Self-pay | Admitting: *Deleted

## 2011-06-26 LAB — HEPATIC FUNCTION PANEL
AST: 26 U/L (ref 0–37)
Alkaline Phosphatase: 53 U/L (ref 39–117)
Bilirubin, Direct: 0.1 mg/dL (ref 0.0–0.3)
Indirect Bilirubin: 0.4 mg/dL (ref 0.0–0.9)
Total Bilirubin: 0.5 mg/dL (ref 0.3–1.2)

## 2011-06-26 LAB — LIPID PANEL: Total CHOL/HDL Ratio: 2.8 Ratio

## 2011-10-01 ENCOUNTER — Encounter: Payer: Self-pay | Admitting: Gastroenterology

## 2011-10-08 ENCOUNTER — Telehealth: Payer: Self-pay | Admitting: Adult Health

## 2011-10-08 ENCOUNTER — Other Ambulatory Visit: Payer: Self-pay

## 2011-10-08 MED ORDER — PRAVASTATIN SODIUM 40 MG PO TABS: 40.0000 mg | ORAL_TABLET | Freq: Every day | ORAL | Status: DC

## 2011-10-08 NOTE — Telephone Encounter (Signed)
PRAVASTATIN 40 MG NEEDS FAXED IN TO [facility] AT [phone]

## 2011-11-07 ENCOUNTER — Encounter: Payer: Self-pay | Admitting: Gastroenterology

## 2011-11-08 ENCOUNTER — Encounter: Payer: Self-pay | Admitting: Gastroenterology

## 2011-11-08 ENCOUNTER — Ambulatory Visit (INDEPENDENT_AMBULATORY_CARE_PROVIDER_SITE_OTHER): Payer: Medicare Other | Admitting: Gastroenterology

## 2011-11-08 VITALS — BP 133/65 | HR 81 | Temp 97.6°F | Ht 63.0 in | Wt 140.2 lb

## 2011-11-08 DIAGNOSIS — K219 Gastro-esophageal reflux disease without esophagitis: Secondary | ICD-10-CM

## 2011-11-08 DIAGNOSIS — Z1211 Encounter for screening for malignant neoplasm of colon: Secondary | ICD-10-CM

## 2011-11-08 DIAGNOSIS — R1319 Other dysphagia: Secondary | ICD-10-CM

## 2011-11-08 MED ORDER — DEXLANSOPRAZOLE 60 MG PO CPDR: 60.0000 mg | DELAYED_RELEASE_CAPSULE | Freq: Every day | ORAL | Status: DC

## 2011-11-08 NOTE — Assessment & Plan Note (Signed)
PT HAS DECLINED TCS.

## 2011-11-08 NOTE — Progress Notes (Signed)
Cc to PCP 

## 2011-11-08 NOTE — Progress Notes (Signed)
Subjective:    Patient ID: Christine Leblanc, female    DOB: 10-15-1929, 77 y.o.   MRN: [id number]  PCP: [facility]  HPI Avoids social situations due to bowel gas. Thinks she has IBS. Wants to know if she can take Iberogast. Currently having allergy flares. Has a easy gag reflex and tool her 2 years to get used to upper dentures. Thinks she swallow too much air. Has trouble swallowing large food boluses and pills. Gained 4 lbs since last year. EATS 3 SMALL MEALS AND SNACKS EVERY DAY.   Past Medical History  Diagnosis Date  . Syncope and collapse   . Hypertension   . Asthma   . Arthritis   . Hernia     hiatel  . Asthma   . Hx of thyroidectomy     Past Surgical History  Procedure Date  . Abdominal hysterectomy     fibroids  . Prolapsed uterine fibroid ligation   . Total thyroidectomy 2010  . Esophagogastroduodenoscopy 12/10    small hiatal ernia/gastric polyps/mild gastritis   Allergies  Allergen Reactions  . Celecoxib   . FAO:ZHYQMVHQION+GEXBMWUXL+KGMWNUUVOZ Acid+Aspartame     Current Outpatient Prescriptions  Medication Sig Dispense Refill  . albuterol (PROVENTIL HFA) 108 (90 BASE) MCG/ACT inhaler Inhale 2 puffs into the lungs every 6 (six) hours as needed.        Marland Kitchen albuterol (PROVENTIL) 4 MG tablet Take 4 mg by mouth 2 (two) times daily.       Marland Kitchen amLODipine (NORVASC) 5 MG tablet Take 5 mg by mouth daily.        Marland Kitchen aspirin 81 MG tablet Take 81 mg by mouth daily.        . calcium-vitamin D (OSCAL WITH D) 500-200 MG-UNIT per tablet Take 1 tablet by mouth 2 (two) times daily.       . clonazePAM (KLONOPIN) 0.5 MG tablet Take 0.5 mg by mouth daily as needed. At bedtime     . dexlansoprazole (DEXILANT) 60 MG capsule Take 1 capsule (60 mg total) by mouth daily.    . diazepam (VALIUM) 5 MG tablet Take 2.5 mg by mouth daily as needed. At bedtime    . Glucosamine 500 MG TABS Take 1 tablet by mouth 2 (two) times daily.     Marland Kitchen latanoprost (XALATAN) 0.005 % ophthalmic solution Place 1 drop  into both eyes at bedtime.      Marland Kitchen levocetirizine (XYZAL) 5 MG tablet Take 5 mg by mouth every evening.      Marland Kitchen levothyroxine (SYNTHROID, LEVOTHROID) 100 MCG tablet Take 100 mcg by mouth daily.      . Omega-3 Fatty Acids (FISH OIL) 1200 MG CAPS Take 1 capsule by mouth 2 (two) times daily.     . pravastatin (PRAVACHOL) 40 MG tablet Take 1 tablet (40 mg total) by mouth daily.    . Probiotic Product (REZYST IM PO) Take 1 tablet by mouth daily.        . Pyridoxine HCl (VITAMIN B-6) 500 MG tablet Take 500 mg by mouth daily.        Christine Leblanc (PERI-COLACE PO) Take 2 tablets by mouth daily.       . vitamin B-12 (CYANOCOBALAMIN) 500 MCG tablet Take 500 mcg by mouth daily.        . vitamin E 400 UNIT capsule Take 400 Units by mouth daily.            Review of Systems     Objective:   Physical  Exam  Constitutional: She is oriented to person, place, and time. She appears well-developed and well-nourished. No distress.  HENT:  Head: Normocephalic and atraumatic.  Mouth/Throat: Oropharynx is clear and moist. No oropharyngeal exudate.  Eyes: Pupils are equal, round, and reactive to light. No scleral icterus.  Neck: Normal range of motion. Neck supple.  Cardiovascular: Normal rate, regular rhythm and normal heart sounds.   Pulmonary/Chest: Effort normal and breath sounds normal.  Abdominal: Soft. Bowel sounds are normal. She exhibits no distension. There is no tenderness.  Neurological: She is alert and oriented to person, place, and time.       NO FOCAL DEFICITS    Psychiatric: She has a normal mood and affect.          Assessment & Plan:

## 2011-11-08 NOTE — Assessment & Plan Note (Signed)
CONTINUE DEXILANT & DIET MODIFICATION. OPV IN 1 YEAR.

## 2011-11-08 NOTE — Assessment & Plan Note (Addendum)
4 LB WEIGHT GAIN. SX UNCHANGED.  CONTINUE DEXILANT & DIET MODIFICATION. OPV IN 1 YEAR.

## 2011-11-08 NOTE — Progress Notes (Signed)
Reminder in epic to follow up in one year/dysphagia,gerd

## 2011-11-08 NOTE — Patient Instructions (Signed)
CONTINUE TO TAKE DEXILANT DAILY. IT IS OKAY TO TAKE IBEROGAST. TAKE A PROBIOTIC(Align, [employer]ora, OR [employer] band) daily. FOLLOW UP IN 1 YEAR.

## 2011-11-20 ENCOUNTER — Emergency Department (HOSPITAL_COMMUNITY): Payer: Medicare Other

## 2011-11-20 ENCOUNTER — Other Ambulatory Visit: Payer: Self-pay

## 2011-11-20 ENCOUNTER — Emergency Department (HOSPITAL_COMMUNITY)
Admission: EM | Admit: 2011-11-20 | Discharge: 2011-11-20 | Disposition: A | Discharge status: Home / Self Care | Payer: Medicare Other | Attending: Emergency Medicine | Admitting: Emergency Medicine

## 2011-11-20 ENCOUNTER — Encounter (HOSPITAL_COMMUNITY): Payer: Self-pay | Admitting: Emergency Medicine

## 2011-11-20 DIAGNOSIS — Z9079 Acquired absence of other genital organ(s): Secondary | ICD-10-CM | POA: Insufficient documentation

## 2011-11-20 DIAGNOSIS — R29898 Other symptoms and signs involving the musculoskeletal system: Secondary | ICD-10-CM

## 2011-11-20 DIAGNOSIS — K449 Diaphragmatic hernia without obstruction or gangrene: Secondary | ICD-10-CM | POA: Insufficient documentation

## 2011-11-20 DIAGNOSIS — R51 Headache: Secondary | ICD-10-CM | POA: Insufficient documentation

## 2011-11-20 DIAGNOSIS — I10 Essential (primary) hypertension: Secondary | ICD-10-CM | POA: Insufficient documentation

## 2011-11-20 DIAGNOSIS — J45909 Unspecified asthma, uncomplicated: Secondary | ICD-10-CM | POA: Insufficient documentation

## 2011-11-20 DIAGNOSIS — M129 Arthropathy, unspecified: Secondary | ICD-10-CM | POA: Insufficient documentation

## 2011-11-20 DIAGNOSIS — Z87891 Personal history of nicotine dependence: Secondary | ICD-10-CM | POA: Insufficient documentation

## 2011-11-20 LAB — DIFFERENTIAL
Basophils Absolute: 0 10*3/uL (ref 0.0–0.1)
Basophils Relative: 1 % (ref 0–1)
Lymphocytes Relative: 28 % (ref 12–46)
Monocytes Absolute: 0.5 10*3/uL (ref 0.1–1.0)
Neutro Abs: 2.8 10*3/uL (ref 1.7–7.7)
Neutrophils Relative %: 54 % (ref 43–77)

## 2011-11-20 LAB — BASIC METABOLIC PANEL
CO2: 27 mEq/L (ref 19–32)
Chloride: 108 mEq/L (ref 96–112)
Creatinine, Ser: 0.61 mg/dL (ref 0.50–1.10)
GFR calc Af Amer: >90 mL/min (ref >90)
Potassium: 3.7 mEq/L (ref 3.5–5.1)

## 2011-11-20 LAB — CBC
HCT: 32.8 % — ABNORMAL LOW (ref 36.0–46.0)
Hemoglobin: 11 g/dL — ABNORMAL LOW (ref 12.0–15.0)
RDW: 13.4 % (ref 11.5–15.5)
WBC: 5.2 10*3/uL (ref 4.0–10.5)

## 2011-11-20 NOTE — ED Notes (Signed)
Pt c/o left sided weakness since 11am and pt states she had the same episode last week.

## 2011-11-20 NOTE — ED Notes (Signed)
Pt reports "my rt leg has been feeling a little weak for the past two weeks".  Pt also reports a headache that "comes and goes".

## 2011-11-20 NOTE — ED Provider Notes (Addendum)
History   This chart was scribed for Donnetta Hutching, MD by Clarita Crane. The patient was seen in room APA14/APA14 and the patient's care was started at 12:43PM.   CSN: [id number] Arrival date & time: 11/20/2011 12:17 PM   First MD Initiated Contact with Patient 11/20/11 1235      Chief Complaint  Patient presents with  . Extremity Weakness    (Consider location/radiation/quality/duration/timing/severity/associated sxs/prior treatment) HPI Christine Leblanc is a 77 y.o. female who presents to the Emergency Department complaining of constant moderate weakness of LLE onset 1.5 hours ago and persistent since with associated mild HA. States she measured her BP after onset of symptoms which was recorded as 140/95. Patient reports having a similar episode of symptoms that occurred last week which lasted less than 1 hour before resolving on own. Denies back pain, numbness, tingling, blurred vision, nausea, vomiting. Patient with h/o asthma, hypertension, arthritis. Patient is a former smoker (quit 32 years ago).  Past Medical History  Diagnosis Date  . Syncope and collapse   . Hypertension   . Asthma   . Arthritis   . Hernia     hiatel  . Asthma   . Hx of thyroidectomy     Past Surgical History  Procedure Date  . Abdominal hysterectomy     fibroids  . Prolapsed uterine fibroid ligation   . Total thyroidectomy 2010  . Esophagogastroduodenoscopy 12/10    small hiatal ernia/gastric polyps/mild gastritis    History reviewed. No pertinent family history.  History  Substance Use Topics  . Smoking status: Former Smoker -- 0.8 packs/day for 20 years    Types: Cigarettes    Quit date: 12/31/1978  . Smokeless tobacco: Never Used  . Alcohol Use: No    OB History    Grav Para Term Preterm Abortions TAB SAB Ect Mult Living                  Review of Systems 10 Systems reviewed and are negative for acute change except as noted in the HPI.  Allergies  Celecoxib and  NWG:NFAOZHYQMVH+QIONGEXBM+WUXLKGMWNU acid+aspartame  Home Medications   Current Outpatient Rx  Name Route Sig Dispense Refill  . ALBUTEROL SULFATE HFA 108 (90 BASE) MCG/ACT IN AERS Inhalation Inhale 2 puffs into the lungs every 6 (six) hours as needed. Wheezing, Asthma Symptoms    . ALBUTEROL SULFATE 4 MG PO TABS Oral Take 4 mg by mouth 2 (two) times daily.     Marland Kitchen AMLODIPINE BESYLATE 5 MG PO TABS Oral Take 5 mg by mouth daily.      . ASPIRIN 81 MG PO TABS Oral Take 81 mg by mouth daily.      Marland Kitchen CALCIUM CARBONATE-VITAMIN D 500-200 MG-UNIT PO TABS Oral Take 1 tablet by mouth 2 (two) times daily.     Marland Kitchen VITAMIN B 12 PO Oral Take 1 tablet by mouth daily.      . DEXLANSOPRAZOLE 60 MG PO CPDR Oral Take 1 capsule (60 mg total) by mouth daily. 90 capsule 3  . DIAZEPAM 5 MG PO TABS Oral Take 2.5 mg by mouth daily as needed. Sleep    . GLUCOSAMINE 500 MG PO TABS Oral Take 1 tablet by mouth 2 (two) times daily.     Marland Kitchen LATANOPROST 0.005 % OP SOLN Both Eyes Place 1 drop into both eyes at bedtime.      Marland Kitchen LEVOCETIRIZINE DIHYDROCHLORIDE 5 MG PO TABS Oral Take 5 mg by mouth every evening.      Marland Kitchen  LEVOTHYROXINE SODIUM 88 MCG PO TABS Oral Take 88 mcg by mouth daily.      Marland Kitchen FISH OIL 1200 MG PO CAPS Oral Take 1 capsule by mouth 2 (two) times daily.     Marland Kitchen PRAVASTATIN SODIUM 40 MG PO TABS Oral Take 1 tablet (40 mg total) by mouth daily. 90 tablet 1  . PRESCRIPTION MEDICATION Injection Inject as directed once a week. Allergy Shot that's administered by a nurse weekly.     Marland Kitchen VITAMIN B-6 500 MG PO TABS Oral Take 500 mg by mouth daily.      Marland Kitchen PERI-COLACE PO Oral Take 2 tablets by mouth daily.     Marland Kitchen VITAMIN E 400 UNITS PO CAPS Oral Take 400 Units by mouth daily.        BP 153/73  Pulse 80  Temp(Src) 97.5 F (36.4 C) (Oral)  Resp 18  Ht 5\' 2"  (1.575 m)  Wt 180 lb (81.647 kg)  BMI 32.92 kg/m2  SpO2 99%  Physical Exam  Nursing note and vitals reviewed. Constitutional: She is oriented to person, place, and time.  She appears well-developed and well-nourished. No distress.  HENT:  Head: Normocephalic and atraumatic.  Eyes: EOM are normal. Pupils are equal, round, and reactive to light.  Neck: Neck supple. No tracheal deviation present.  Cardiovascular: Normal rate and regular rhythm.   No murmur heard. Pulmonary/Chest: Effort normal. No respiratory distress.  Abdominal: She exhibits no distension.  Musculoskeletal: Normal range of motion. She exhibits no edema and no tenderness.  Neurological: She is alert and oriented to person, place, and time. No sensory deficit.       Bilateral grip strength normal and equal.   Skin: Skin is warm and dry.  Psychiatric: She has a normal mood and affect. Her behavior is normal.    ED Course  Procedures (including critical care time)  DIAGNOSTIC STUDIES: Oxygen Saturation is 99% on room air, normal by my interpretation.    COORDINATION OF CARE:    Labs Reviewed  CBC - Abnormal; Notable for the following:    RBC 3.71 (*)    Hemoglobin 11.0 (*)    HCT 32.8 (*)    All other components within normal limits  DIFFERENTIAL - Abnormal; Notable for the following:    Eosinophils Relative 9 (*)    All other components within normal limits  BASIC METABOLIC PANEL - Abnormal; Notable for the following:    GFR calc non Af Amer 83 (*)    All other components within normal limits   Ct Head Wo Contrast  11/20/2011  *RADIOLOGY REPORT*  Clinical Data: Right leg weakness  CT HEAD WITHOUT CONTRAST  Technique:  Contiguous axial images were obtained from the base of the skull through the vertex without contrast.  Comparison: CT 09/10/2008  Findings: Age appropriate atrophy.  Patchy hypodensity in the cerebral white matter bilaterally is unchanged from the  prior study and compatible with chronic microvascular ischemia.  No definite acute infarct.  Negative for hemorrhage or mass lesion. Calvarium is intact.  Chronic sinusitis.  IMPRESSION: Atrophy and chronic microvascular  ischemic change.  No acute infarct or hemorrhage.  Original Report Authenticated By: Camelia Phenes, M.D.     No diagnosis found.    MDM  History and physical could be related to a TIA. Discussed with the patient. she understands. She does not want to be admitted to the hospital. She is alert and oriented. Will see her primary care Dr. on Friday. Appointment made. Recommended  aspirin daily.          Donnetta Hutching, MD 11/20/11 1606  Donnetta Hutching, MD 11/20/11 [phone]

## 2011-12-04 ENCOUNTER — Other Ambulatory Visit (HOSPITAL_COMMUNITY): Payer: Self-pay | Admitting: Internal Medicine

## 2011-12-04 DIAGNOSIS — G459 Transient cerebral ischemic attack, unspecified: Secondary | ICD-10-CM

## 2011-12-04 DIAGNOSIS — Z139 Encounter for screening, unspecified: Secondary | ICD-10-CM

## 2011-12-07 ENCOUNTER — Ambulatory Visit (INDEPENDENT_AMBULATORY_CARE_PROVIDER_SITE_OTHER): Payer: Medicare Other | Admitting: Adult Health

## 2011-12-07 ENCOUNTER — Other Ambulatory Visit (HOSPITAL_COMMUNITY): Payer: Medicare Other

## 2011-12-07 ENCOUNTER — Encounter: Payer: Self-pay | Admitting: Adult Health

## 2011-12-07 DIAGNOSIS — I10 Essential (primary) hypertension: Secondary | ICD-10-CM

## 2011-12-07 DIAGNOSIS — Z8673 Personal history of transient ischemic attack (TIA), and cerebral infarction without residual deficits: Secondary | ICD-10-CM

## 2011-12-07 DIAGNOSIS — E7800 Pure hypercholesterolemia, unspecified: Secondary | ICD-10-CM

## 2011-12-07 MED ORDER — PRAVASTATIN SODIUM 40 MG PO TABS: 40.0000 mg | ORAL_TABLET | Freq: Every day | ORAL | Status: DC

## 2011-12-07 NOTE — Assessment & Plan Note (Addendum)
Blood pressure is well controlled despite her anxiety. She is medically complaint. Would not make any changes at this time to her medication regimen. Due to multiple questions and concerns and lengthy description of her illness, I have spent 25 minutes with this patient.

## 2011-12-07 NOTE — Assessment & Plan Note (Signed)
She has expressed transient left leg weakness, but also has a history of leg pain and cramping in the past. I do not think that she would be a good candidate for plavix therapy in this setting as she is very skitish about her bleeding tendencies.  I agree with carotid study at this time although doubt that there is significant stenosis. . CT scan did not show acute infarct. Atrophy and chronic microvascular ischemic changes were noted.

## 2011-12-07 NOTE — Patient Instructions (Signed)
Your physician recommends that you schedule a follow-up appointment in: 1 year  Your physician has recommended you make the following change in your medication: Increase aspirin 375 mg

## 2011-12-07 NOTE — Progress Notes (Signed)
HPI: Christine Leblanc is a 42 patient of Dr. Dietrich Pates we are following for hypertension and hypercholesterolemia.  She has multiple chronic complaints and is very talkative about all of them. She was recently in the ER after experiencing left leg weakness and back pain. She had a CT scan that was found to be negative for acute CVA. She has been ordered a carotid doppler study. She was advised to be placed on plavix until seen on follow-up but has refused to take it because she bleeds very easily. She also complains of severe back pain relieved with NSAIDS and heating pad. She has had no further episodes of left leg weakness. She was told she was having TIA's.  She denies chest pain, dizziness or DOE. No visual disturbances or trouble speaking.  She rambles quite a bit and it is difficult to keep her on one subject.  Allergies  Allergen Reactions  . Celecoxib Shortness Of Breath  . WUJ:WJXBJYNWGNF+AOZHYQMVH+QIONGEXBMW Acid+Aspartame     Current Outpatient Prescriptions  Medication Sig Dispense Refill  . albuterol (PROVENTIL HFA) 108 (90 BASE) MCG/ACT inhaler Inhale 2 puffs into the lungs every 6 (six) hours as needed. Wheezing, Asthma Symptoms      . albuterol (PROVENTIL) 4 MG tablet Take 4 mg by mouth 2 (two) times daily.       Marland Kitchen amLODipine (NORVASC) 5 MG tablet Take 5 mg by mouth daily.        Marland Kitchen aspirin 325 MG tablet Take 325 mg by mouth daily.        . calcium-vitamin D (OSCAL WITH D) 500-200 MG-UNIT per tablet Take 1 tablet by mouth 2 (two) times daily.       . Cyanocobalamin (VITAMIN B 12 PO) Take 1 tablet by mouth daily.        Marland Kitchen dexlansoprazole (DEXILANT) 60 MG capsule Take 1 capsule (60 mg total) by mouth daily.  90 capsule  3  . diazepam (VALIUM) 5 MG tablet Take 2.5 mg by mouth daily as needed. Sleep      . Glucosamine 500 MG TABS Take 1 tablet by mouth 2 (two) times daily.       Marland Kitchen latanoprost (XALATAN) 0.005 % ophthalmic solution Place 1 drop into both eyes at bedtime.        Marland Kitchen  levocetirizine (XYZAL) 5 MG tablet Take 5 mg by mouth every evening.        Marland Kitchen levothyroxine (SYNTHROID, LEVOTHROID) 88 MCG tablet Take 88 mcg by mouth daily.        . Omega-3 Fatty Acids (FISH OIL) 1200 MG CAPS Take 1 capsule by mouth 2 (two) times daily.       . pravastatin (PRAVACHOL) 40 MG tablet Take 1 tablet (40 mg total) by mouth daily.  90 tablet  3  . PRESCRIPTION MEDICATION Inject as directed once a week. Allergy Shot that's administered by a nurse weekly.       . Pyridoxine HCl (VITAMIN B-6) 500 MG tablet Take 500 mg by mouth daily.        Bernadette Hoit Sodium (PERI-COLACE PO) Take 2 tablets by mouth daily.       . vitamin E 400 UNIT capsule Take 400 Units by mouth daily.        Marland Kitchen DISCONTD: pravastatin (PRAVACHOL) 40 MG tablet Take 1 tablet (40 mg total) by mouth daily.  90 tablet  1    Past Medical History  Diagnosis Date  . Syncope and collapse   . Hypertension   .  Asthma   . Arthritis   . Hernia     hiatel  . Asthma   . Hx of thyroidectomy     Past Surgical History  Procedure Date  . Abdominal hysterectomy     fibroids  . Prolapsed uterine fibroid ligation   . Total thyroidectomy 2010  . Esophagogastroduodenoscopy 12/10    small hiatal ernia/gastric polyps/mild gastritis    UYQ:IHKVQQ of systems complete and found to be negative unless listed above PHYSICAL EXAM BP 129/69  Pulse 77  Ht 5\' 2"  (1.575 m)  Wt 133 lb (60.328 kg)  BMI 24.33 kg/m2  SpO2 96%  General: Well developed, well nourished, in no acute distress Head: Eyes PERRLA, No xanthomas.   Normal cephalic and atramatic  Lungs: Clear bilaterally to auscultation and percussion. Heart: HRRR S1 S2, with soft 1/6 systolic murmur.   Pulses are 2+ & equal.            No carotid bruit. No JVD.  No abdominal bruits. No femoral bruits. Abdomen: Bowel sounds are positive, abdomen soft and non-tender without masses or                  Hernia's noted. Msk:  Back normal, slow gait. Diminished strength  and tone for age. Extremities: No clubbing, cyanosis or edema.  DP +1 Neuro: Alert and oriented X 3. 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 EKG:  ASSESSMENT AND PLAN

## 2011-12-07 NOTE — Assessment & Plan Note (Signed)
Most recent labs demonstrate good control. She is due for more labs this month for Q 6 month follow-up. These results will be sent to her primary care physician.

## 2011-12-10 ENCOUNTER — Other Ambulatory Visit (HOSPITAL_COMMUNITY): Payer: Medicare Other

## 2011-12-12 ENCOUNTER — Ambulatory Visit (HOSPITAL_COMMUNITY)
Admission: RE | Admit: 2011-12-12 | Discharge: 2011-12-12 | Disposition: A | Discharge status: Home / Self Care | Payer: Medicare Other | Source: Ambulatory Visit | Attending: Internal Medicine | Admitting: Internal Medicine

## 2011-12-12 DIAGNOSIS — G459 Transient cerebral ischemic attack, unspecified: Secondary | ICD-10-CM

## 2011-12-12 DIAGNOSIS — Z13820 Encounter for screening for osteoporosis: Secondary | ICD-10-CM | POA: Insufficient documentation

## 2011-12-12 DIAGNOSIS — I10 Essential (primary) hypertension: Secondary | ICD-10-CM | POA: Insufficient documentation

## 2011-12-12 DIAGNOSIS — Z780 Asymptomatic menopausal state: Secondary | ICD-10-CM | POA: Insufficient documentation

## 2011-12-12 DIAGNOSIS — Z139 Encounter for screening, unspecified: Secondary | ICD-10-CM

## 2011-12-14 ENCOUNTER — Other Ambulatory Visit (HOSPITAL_COMMUNITY): Payer: Self-pay | Admitting: Family Medicine

## 2011-12-14 ENCOUNTER — Ambulatory Visit (HOSPITAL_COMMUNITY)
Admission: RE | Admit: 2011-12-14 | Discharge: 2011-12-14 | Disposition: A | Discharge status: Home / Self Care | Payer: Medicare Other | Source: Ambulatory Visit | Attending: Family Medicine | Admitting: Family Medicine

## 2011-12-14 DIAGNOSIS — M545 Low back pain, unspecified: Secondary | ICD-10-CM | POA: Insufficient documentation

## 2011-12-14 DIAGNOSIS — M549 Dorsalgia, unspecified: Secondary | ICD-10-CM

## 2011-12-14 DIAGNOSIS — M546 Pain in thoracic spine: Secondary | ICD-10-CM | POA: Insufficient documentation

## 2012-01-02 DIAGNOSIS — J309 Allergic rhinitis, unspecified: Secondary | ICD-10-CM | POA: Diagnosis not present

## 2012-01-08 DIAGNOSIS — T7840XA Allergy, unspecified, initial encounter: Secondary | ICD-10-CM | POA: Diagnosis not present

## 2012-01-08 DIAGNOSIS — J45909 Unspecified asthma, uncomplicated: Secondary | ICD-10-CM | POA: Diagnosis not present

## 2012-01-08 DIAGNOSIS — J309 Allergic rhinitis, unspecified: Secondary | ICD-10-CM | POA: Diagnosis not present

## 2012-01-17 DIAGNOSIS — J309 Allergic rhinitis, unspecified: Secondary | ICD-10-CM | POA: Diagnosis not present

## 2012-01-23 DIAGNOSIS — H2510 Age-related nuclear cataract, unspecified eye: Secondary | ICD-10-CM | POA: Diagnosis not present

## 2012-01-23 DIAGNOSIS — H401390 Pigmentary glaucoma, unspecified eye, stage unspecified: Secondary | ICD-10-CM | POA: Diagnosis not present

## 2012-01-24 DIAGNOSIS — J309 Allergic rhinitis, unspecified: Secondary | ICD-10-CM | POA: Diagnosis not present

## 2012-02-07 DIAGNOSIS — J309 Allergic rhinitis, unspecified: Secondary | ICD-10-CM | POA: Diagnosis not present

## 2012-02-08 DIAGNOSIS — J45909 Unspecified asthma, uncomplicated: Secondary | ICD-10-CM | POA: Diagnosis not present

## 2012-02-08 DIAGNOSIS — K219 Gastro-esophageal reflux disease without esophagitis: Secondary | ICD-10-CM | POA: Diagnosis not present

## 2012-02-08 DIAGNOSIS — J3089 Other allergic rhinitis: Secondary | ICD-10-CM | POA: Diagnosis not present

## 2012-02-13 DIAGNOSIS — D044 Carcinoma in situ of skin of scalp and neck: Secondary | ICD-10-CM | POA: Diagnosis not present

## 2012-02-15 DIAGNOSIS — J309 Allergic rhinitis, unspecified: Secondary | ICD-10-CM | POA: Diagnosis not present

## 2012-02-25 DIAGNOSIS — J309 Allergic rhinitis, unspecified: Secondary | ICD-10-CM | POA: Diagnosis not present

## 2012-03-07 DIAGNOSIS — T7840XA Allergy, unspecified, initial encounter: Secondary | ICD-10-CM | POA: Diagnosis not present

## 2012-03-13 DIAGNOSIS — IMO0002 Reserved for concepts with insufficient information to code with codable children: Secondary | ICD-10-CM | POA: Diagnosis not present

## 2012-03-13 DIAGNOSIS — H2510 Age-related nuclear cataract, unspecified eye: Secondary | ICD-10-CM | POA: Diagnosis not present

## 2012-03-18 DIAGNOSIS — J309 Allergic rhinitis, unspecified: Secondary | ICD-10-CM | POA: Diagnosis not present

## 2012-03-20 DIAGNOSIS — H59029 Cataract (lens) fragments in eye following cataract surgery, unspecified eye: Secondary | ICD-10-CM | POA: Diagnosis not present

## 2012-03-20 DIAGNOSIS — H2510 Age-related nuclear cataract, unspecified eye: Secondary | ICD-10-CM | POA: Diagnosis not present

## 2012-03-20 DIAGNOSIS — H579 Unspecified disorder of eye and adnexa: Secondary | ICD-10-CM | POA: Diagnosis not present

## 2012-03-26 DIAGNOSIS — D044 Carcinoma in situ of skin of scalp and neck: Secondary | ICD-10-CM | POA: Diagnosis not present

## 2012-03-27 DIAGNOSIS — J309 Allergic rhinitis, unspecified: Secondary | ICD-10-CM | POA: Diagnosis not present

## 2012-03-27 DIAGNOSIS — I10 Essential (primary) hypertension: Secondary | ICD-10-CM | POA: Diagnosis not present

## 2012-03-27 DIAGNOSIS — R7301 Impaired fasting glucose: Secondary | ICD-10-CM | POA: Diagnosis not present

## 2012-04-04 DIAGNOSIS — J309 Allergic rhinitis, unspecified: Secondary | ICD-10-CM | POA: Diagnosis not present

## 2012-04-16 DIAGNOSIS — J309 Allergic rhinitis, unspecified: Secondary | ICD-10-CM | POA: Diagnosis not present

## 2012-04-17 DIAGNOSIS — IMO0002 Reserved for concepts with insufficient information to code with codable children: Secondary | ICD-10-CM | POA: Diagnosis not present

## 2012-04-17 DIAGNOSIS — H2510 Age-related nuclear cataract, unspecified eye: Secondary | ICD-10-CM | POA: Diagnosis not present

## 2012-04-23 DIAGNOSIS — J309 Allergic rhinitis, unspecified: Secondary | ICD-10-CM | POA: Diagnosis not present

## 2012-04-26 ENCOUNTER — Encounter: Payer: Self-pay | Admitting: Cardiology

## 2012-04-26 DIAGNOSIS — D649 Anemia, unspecified: Secondary | ICD-10-CM | POA: Insufficient documentation

## 2012-04-26 DIAGNOSIS — E063 Autoimmune thyroiditis: Secondary | ICD-10-CM | POA: Insufficient documentation

## 2012-04-26 DIAGNOSIS — K219 Gastro-esophageal reflux disease without esophagitis: Secondary | ICD-10-CM | POA: Insufficient documentation

## 2012-04-28 ENCOUNTER — Encounter: Payer: Self-pay | Admitting: *Deleted

## 2012-04-28 ENCOUNTER — Encounter: Payer: Self-pay | Admitting: Cardiology

## 2012-04-28 ENCOUNTER — Ambulatory Visit (INDEPENDENT_AMBULATORY_CARE_PROVIDER_SITE_OTHER): Payer: Medicare Other | Admitting: Cardiology

## 2012-04-28 VITALS — BP 143/79 | HR 77 | Resp 16 | Wt 136.0 lb

## 2012-04-28 DIAGNOSIS — R55 Syncope and collapse: Secondary | ICD-10-CM

## 2012-04-28 DIAGNOSIS — D649 Anemia, unspecified: Secondary | ICD-10-CM

## 2012-04-28 DIAGNOSIS — I10 Essential (primary) hypertension: Secondary | ICD-10-CM

## 2012-04-28 DIAGNOSIS — E782 Mixed hyperlipidemia: Secondary | ICD-10-CM

## 2012-04-28 DIAGNOSIS — M79609 Pain in unspecified limb: Secondary | ICD-10-CM

## 2012-04-28 DIAGNOSIS — E785 Hyperlipidemia, unspecified: Secondary | ICD-10-CM | POA: Diagnosis not present

## 2012-04-28 DIAGNOSIS — J45909 Unspecified asthma, uncomplicated: Secondary | ICD-10-CM

## 2012-04-28 DIAGNOSIS — M79604 Pain in right leg: Secondary | ICD-10-CM

## 2012-04-28 MED ORDER — PRAVASTATIN SODIUM 80 MG PO TABS: 80.0000 mg | ORAL_TABLET | Freq: Every day | ORAL | Status: DC

## 2012-04-28 NOTE — Progress Notes (Deleted)
Name: Christine Leblanc    DOB: 1929/08/16  Age: 78 y.o.  MR#: [id number]       PCP:  Kirk Ruths, MD, MD      Insurance: @PAYORNAME @   CC:    Chief Complaint  Patient presents with  . Appointment    fatigue +med list    VS BP 143/79  Pulse 77  Resp 16  Wt 136 lb (61.689 kg)  Weights Current Weight  04/28/12 136 lb (61.689 kg)  12/07/11 133 lb (60.328 kg)  11/20/11 180 lb (81.647 kg)    Blood Pressure  BP Readings from Last 3 Encounters:  04/28/12 143/79  12/07/11 129/69  11/20/11 143/73     Admit date:  (Not on file) Last encounter with RMR:  04/26/2012   Allergy Allergies  Allergen Reactions  . Celecoxib Shortness Of Breath  . EXB:MWUXLKGMWNU+UVOZDGUYQ+IHKVQQVZDG Acid+Aspartame     Current Outpatient Prescriptions  Medication Sig Dispense Refill  . albuterol (PROVENTIL HFA) 108 (90 BASE) MCG/ACT inhaler Inhale 2 puffs into the lungs every 6 (six) hours as needed. Wheezing, Asthma Symptoms      . amLODipine (NORVASC) 5 MG tablet Take 5 mg by mouth daily.        Marland Kitchen aspirin 325 MG tablet Take 325 mg by mouth daily.        . beclomethasone (QVAR) 80 MCG/ACT inhaler Inhale 1 puff into the lungs as needed.      . calcium-vitamin D (OSCAL WITH D) 500-200 MG-UNIT per tablet Take 1 tablet by mouth 2 (two) times daily.       . Cyanocobalamin (VITAMIN B 12 PO) Take 1 tablet by mouth daily.        Marland Kitchen dexlansoprazole (DEXILANT) 60 MG capsule Take 1 capsule (60 mg total) by mouth daily.  90 capsule  3  . diazepam (VALIUM) 5 MG tablet Take 2.5 mg by mouth daily as needed. Sleep      . etodolac (LODINE) 300 MG capsule Take 300 mg by mouth 2 (two) times daily.      . ferrous sulfate 325 (65 FE) MG EC tablet Take 325 mg by mouth daily with breakfast.      . Glucosamine 500 MG TABS Take 1 tablet by mouth 2 (two) times daily.       Marland Kitchen latanoprost (XALATAN) 0.005 % ophthalmic solution Place 1 drop into both eyes at bedtime.        Marland Kitchen levocetirizine (XYZAL) 5 MG tablet Take 5 mg by mouth  every evening.        Marland Kitchen levothyroxine (SYNTHROID, LEVOTHROID) 88 MCG tablet Take 88 mcg by mouth daily.        . Omega-3 Fatty Acids (FISH OIL) 1200 MG CAPS Take 1 capsule by mouth 2 (two) times daily.       . pravastatin (PRAVACHOL) 40 MG tablet Take 1 tablet (40 mg total) by mouth daily.  90 tablet  3  . PRESCRIPTION MEDICATION Inject as directed once a week. Allergy Shot that's administered by a nurse weekly.       . Pyridoxine HCl (VITAMIN B-6) 500 MG tablet Take 500 mg by mouth daily.        Bernadette Hoit Sodium (PERI-COLACE PO) Take 2 tablets by mouth daily.       . vitamin E 400 UNIT capsule Take 400 Units by mouth daily.        Marland Kitchen DISCONTD: albuterol (PROVENTIL) 4 MG tablet Take 4 mg by mouth 2 (two) times daily.  Discontinued Meds:    Medications Discontinued During This Encounter  Medication Reason  . albuterol (PROVENTIL) 4 MG tablet Error    Patient Active Problem List  Diagnoses  . ANEMIA, NORMOCYTIC  . ASTHMA  . Leg pain, bilateral  . Hyperlipidemia  . Hypertension  . History of TIAs  . Gastroesophageal reflux disease  . Lymphocytic thyroiditis  . Anemia    LABS No visits with results within 3 Month(s) from this visit. Latest known visit with results is:  Admission on 11/20/2011, Discharged on 11/20/2011  Component Date Value  . WBC 11/20/2011 5.2   . RBC 11/20/2011 3.71*  . Hemoglobin 11/20/2011 11.0*  . HCT 11/20/2011 32.8*  . MCV 11/20/2011 88.4   . [facility] 11/20/2011 29.6   . MCHC 11/20/2011 33.5   . RDW 11/20/2011 13.4   . Platelets 11/20/2011 164   . Neutrophils Relative 11/20/2011 54   . Neutro Abs 11/20/2011 2.8   . Lymphocytes Relative 11/20/2011 28   . Lymphs Abs 11/20/2011 1.4   . Monocytes Relative 11/20/2011 10   . Monocytes Absolute 11/20/2011 0.5   . Eosinophils Relative 11/20/2011 9*  . Eosinophils Absolute 11/20/2011 0.5   . Basophils Relative 11/20/2011 1   . Basophils Absolute 11/20/2011 0.0   . Sodium 11/20/2011 143     . Potassium 11/20/2011 3.7   . Chloride 11/20/2011 108   . CO2 11/20/2011 27   . Glucose, Bld 11/20/2011 95   . BUN 11/20/2011 20   . Creatinine, Ser 11/20/2011 0.61   . Calcium 11/20/2011 9.2   . GFR calc non Af Amer 11/20/2011 83*  . GFR calc Af Amer 11/20/2011 >90      Results for this Opt Visit:     Results for orders placed during the hospital encounter of 11/20/11  CBC      Component Value Range   WBC 5.2  4.0 - 10.5 (K/uL)   RBC 3.71 (*) 3.87 - 5.11 (MIL/uL)   Hemoglobin 11.0 (*) 12.0 - 15.0 (g/dL)   HCT 14.7 (*) 82.9 - 46.0 (%)   MCV 88.4  78.0 - 100.0 (fL)   MCH 29.6  26.0 - 34.0 (pg)   MCHC 33.5  30.0 - 36.0 (g/dL)   RDW 56.2  13.0 - 86.5 (%)   Platelets 164  150 - 400 (K/uL)  DIFFERENTIAL      Component Value Range   Neutrophils Relative 54  43 - 77 (%)   Neutro Abs 2.8  1.7 - 7.7 (K/uL)   Lymphocytes Relative 28  12 - 46 (%)   Lymphs Abs 1.4  0.7 - 4.0 (K/uL)   Monocytes Relative 10  3 - 12 (%)   Monocytes Absolute 0.5  0.1 - 1.0 (K/uL)   Eosinophils Relative 9 (*) 0 - 5 (%)   Eosinophils Absolute 0.5  0.0 - 0.7 (K/uL)   Basophils Relative 1  0 - 1 (%)   Basophils Absolute 0.0  0.0 - 0.1 (K/uL)  BASIC METABOLIC PANEL      Component Value Range   Sodium 143  135 - 145 (mEq/L)   Potassium 3.7  3.5 - 5.1 (mEq/L)   Chloride 108  96 - 112 (mEq/L)   CO2 27  19 - 32 (mEq/L)   Glucose, Bld 95  70 - 99 (mg/dL)   BUN 20  6 - 23 (mg/dL)   Creatinine, Ser 7.84  0.50 - 1.10 (mg/dL)   Calcium 9.2  8.4 - 69.6 (mg/dL)   GFR calc  non Af Amer 83 (*) >90 (mL/min)   GFR calc Af Amer >90  >90 (mL/min)    EKG Orders placed during the hospital encounter of 11/20/11  . ED EKG  . ED EKG  . EKG     Prior Assessment and Plan Problem List as of 04/28/2012          Cardiology Problems   Hypertension   Last Assessment & Plan Note   12/07/2011 Office Visit Addendum 12/07/2011  4:42 PM by Jodelle Gross, NP    Blood pressure is well controlled despite her anxiety. She  is medically complaint. Would not make any changes at this time to her medication regimen. Due to multiple questions and concerns and lengthy description of her illness, I have spent 25 minutes with this patient.    Hyperlipidemia   Last Assessment & Plan Note   12/07/2011 Office Visit Signed 12/07/2011  4:40 PM by Jodelle Gross, NP    Most recent labs demonstrate good control. She is due for more labs this month for Q 6 month follow-up. These results will be sent to her primary care physician.        Other   ANEMIA, NORMOCYTIC   ASTHMA   Leg pain, bilateral   Last Assessment & Plan Note   04/27/2011 Office Visit Addendum 04/27/2011  1:22 PM by Jodelle Gross, NP    On review of her medications I find that she is taking benadryl to help with sleep.  I have advised her to stop taking this as it has been found to cause restless legs and cramping in some people.  With respect to varicosities, I have advised her to wear support hose to assist with some leg discomfort as a result of the varicose veins.  I have advised her to see Dr. Oletta Cohn for evaluation of need to X-ray lumbar/sacral spine for abnormalities contributing to her symptoms.    On assessment she had great pulses, and no edema.  She verbalized understanding. I will check BMET and Mg level.    History of TIAs   Last Assessment & Plan Note   12/07/2011 Office Visit Signed 12/07/2011  4:39 PM by Jodelle Gross, NP    She has expressed transient left leg weakness, but also has a history of leg pain and cramping in the past. I do not think that she would be a good candidate for plavix therapy in this setting as she is very skitish about her bleeding tendencies.  I agree with carotid study at this time although doubt that there is significant stenosis. . CT scan did not show acute infarct. Atrophy and chronic microvascular ischemic changes were noted.     Gastroesophageal reflux disease   Lymphocytic thyroiditis   Anemia        Imaging: No results found.   FRS Calculation: Score not calculated

## 2012-04-28 NOTE — Assessment & Plan Note (Signed)
Blood pressure control is adequate.  Patient will monitor home values over the next month or 2 and return a list of readings to [place].

## 2012-04-28 NOTE — Progress Notes (Signed)
Patient ID: Christine Leblanc, female   DOB: 03/16/1929, 78 y.o.   MRN: [id number]  HPI: Scheduled return visit for this nice woman with hypertension and hyperlipidemia.  Since her last visit, she has done fairly well.  She has problems with chronic back problems but no chest pain or dyspnea.  She has had no recurrent neurologic symptoms.  Prior to Admission medications   Medication Sig Start Date End Date Taking? Authorizing Provider  albuterol (PROVENTIL HFA) 108 (90 BASE) MCG/ACT inhaler Inhale 2 puffs into the lungs every 6 (six) hours as needed. Wheezing, Asthma Symptoms   Yes Historical Provider, MD  amLODipine (NORVASC) 5 MG tablet Take 5 mg by mouth daily.     Yes Historical Provider, MD  aspirin 325 MG tablet Take 325 mg by mouth daily.     Yes Historical Provider, MD  beclomethasone (QVAR) 80 MCG/ACT inhaler Inhale 1 puff into the lungs as needed.   Yes Historical Provider, MD  calcium-vitamin D (OSCAL WITH D) 500-200 MG-UNIT per tablet Take 1 tablet by mouth 2 (two) times daily.    Yes Historical Provider, MD  Cyanocobalamin (VITAMIN B 12 PO) Take 1 tablet by mouth daily.     Yes Historical Provider, MD  dexlansoprazole (DEXILANT) 60 MG capsule Take 1 capsule (60 mg total) by mouth daily. 11/08/11  Yes [place], MD  diazepam (VALIUM) 5 MG tablet Take 2.5 mg by mouth daily as needed. Sleep   Yes Historical Provider, MD  etodolac (LODINE) 300 MG capsule Take 300 mg by mouth 2 (two) times daily.   Yes Historical Provider, MD  ferrous sulfate 325 (65 FE) MG EC tablet Take 325 mg by mouth daily with breakfast.   Yes Historical Provider, MD  Glucosamine 500 MG TABS Take 1 tablet by mouth 2 (two) times daily.    Yes Historical Provider, MD  latanoprost (XALATAN) 0.005 % ophthalmic solution Place 1 drop into both eyes at bedtime.     Yes Historical Provider, MD  levocetirizine (XYZAL) 5 MG tablet Take 5 mg by mouth every evening.     Yes Historical Provider, MD  levothyroxine (SYNTHROID,  LEVOTHROID) 88 MCG tablet Take 88 mcg by mouth daily.     Yes Historical Provider, MD  Omega-3 Fatty Acids (FISH OIL) 1200 MG CAPS Take 1 capsule by mouth 2 (two) times daily.    Yes Historical Provider, MD  pravastatin (PRAVACHOL) 40 MG tablet Take 1 tablet (40 mg total) by mouth daily. 12/07/11  Yes Jodelle Gross, NP  PRESCRIPTION MEDICATION Inject as directed once a week. Allergy Shot that's administered by a nurse weekly.    Yes Historical Provider, MD  Pyridoxine HCl (VITAMIN B-6) 500 MG tablet Take 500 mg by mouth daily.     Yes Historical Provider, MD  Sennosides-Docusate Sodium (PERI-COLACE PO) Take 2 tablets by mouth daily.    Yes Historical Provider, MD  vitamin E 400 UNIT capsule Take 400 Units by mouth daily.     Yes Historical Provider, MD   Allergies  Allergen Reactions  . Celecoxib Shortness Of Breath  . YQM:VHQIONGEXBM+WUXLKGMWN+UUVOZDGUYQ Acid+Aspartame      Past medical history, social history, and family history reviewed and updated.  ROS: denies orthopnea, PND, palpitations and syncope.  All other systems reviewed and are negative.  PHYSICAL EXAM: BP 143/79  Pulse 77  Resp 16  Wt 61.689 kg (136 lb)   General-Well developed; no acute distress Body habitus-proportionate weight and height Neck-No JVD; no carotid bruits Lungs-clear  lung fields; resonant to percussion Cardiovascular-normal PMI; normal S1 and S2; modest systolic murmur Abdomen-normal bowel sounds; soft and non-tender without masses or organomegaly Musculoskeletal-No deformities, no cyanosis or clubbing Neurologic-Normal cranial nerves; symmetric strength and tone Skin-Warm, no significant lesions Extremities-distal pulses intact; no edema; varicose veins present  ASSESSMENT AND PLAN:  [place], MD 04/28/2012 1:52 PM

## 2012-04-28 NOTE — Assessment & Plan Note (Signed)
Modest anemia in the past without a specific etiology identified.  A repeat CBC will be obtained.

## 2012-04-28 NOTE — Assessment & Plan Note (Addendum)
Lipid profile was suboptimal when assessed one year ago.  Dose of pravastatin will be increased to 80 mg per day if repeat lipid determination is similar.

## 2012-04-28 NOTE — Patient Instructions (Signed)
Your physician recommends that you schedule a follow-up appointment in: 1 year  Your physician has recommended you make the following change in your medication:  1 - INCREASE Pravastatin to 80 mg daily  Your physician recommends that you return for lab work in: 1 month (you will receive a letter)  Your physician has requested that you regularly monitor and record your blood pressure readings at home. Please use the same machine at the same time of day to check your readings and record them to bring to the office in 1-2 months.

## 2012-04-29 DIAGNOSIS — J309 Allergic rhinitis, unspecified: Secondary | ICD-10-CM | POA: Diagnosis not present

## 2012-05-02 ENCOUNTER — Encounter: Payer: Self-pay | Admitting: Cardiology

## 2012-05-02 DIAGNOSIS — R55 Syncope and collapse: Secondary | ICD-10-CM | POA: Insufficient documentation

## 2012-05-06 DIAGNOSIS — T7840XA Allergy, unspecified, initial encounter: Secondary | ICD-10-CM | POA: Diagnosis not present

## 2012-05-09 DIAGNOSIS — E039 Hypothyroidism, unspecified: Secondary | ICD-10-CM | POA: Diagnosis not present

## 2012-05-09 DIAGNOSIS — E785 Hyperlipidemia, unspecified: Secondary | ICD-10-CM | POA: Diagnosis not present

## 2012-05-09 DIAGNOSIS — I10 Essential (primary) hypertension: Secondary | ICD-10-CM | POA: Diagnosis not present

## 2012-05-19 DIAGNOSIS — J309 Allergic rhinitis, unspecified: Secondary | ICD-10-CM | POA: Diagnosis not present

## 2012-05-29 ENCOUNTER — Other Ambulatory Visit: Payer: Self-pay | Admitting: *Deleted

## 2012-05-29 DIAGNOSIS — E782 Mixed hyperlipidemia: Secondary | ICD-10-CM

## 2012-05-30 DIAGNOSIS — I10 Essential (primary) hypertension: Secondary | ICD-10-CM | POA: Diagnosis not present

## 2012-05-30 DIAGNOSIS — T7840XA Allergy, unspecified, initial encounter: Secondary | ICD-10-CM | POA: Diagnosis not present

## 2012-05-30 DIAGNOSIS — E785 Hyperlipidemia, unspecified: Secondary | ICD-10-CM | POA: Diagnosis not present

## 2012-05-30 DIAGNOSIS — E039 Hypothyroidism, unspecified: Secondary | ICD-10-CM | POA: Diagnosis not present

## 2012-06-05 ENCOUNTER — Encounter: Payer: Self-pay | Admitting: Cardiology

## 2012-06-09 DIAGNOSIS — J309 Allergic rhinitis, unspecified: Secondary | ICD-10-CM | POA: Diagnosis not present

## 2012-06-12 DIAGNOSIS — E890 Postprocedural hypothyroidism: Secondary | ICD-10-CM | POA: Diagnosis not present

## 2012-06-12 DIAGNOSIS — T7840XA Allergy, unspecified, initial encounter: Secondary | ICD-10-CM | POA: Diagnosis not present

## 2012-06-16 ENCOUNTER — Encounter: Payer: Self-pay | Admitting: Cardiology

## 2012-06-19 DIAGNOSIS — J3089 Other allergic rhinitis: Secondary | ICD-10-CM | POA: Diagnosis not present

## 2012-07-04 DIAGNOSIS — J3089 Other allergic rhinitis: Secondary | ICD-10-CM | POA: Diagnosis not present

## 2012-07-08 DIAGNOSIS — Z1231 Encounter for screening mammogram for malignant neoplasm of breast: Secondary | ICD-10-CM | POA: Diagnosis not present

## 2012-07-09 DIAGNOSIS — M19019 Primary osteoarthritis, unspecified shoulder: Secondary | ICD-10-CM | POA: Diagnosis not present

## 2012-07-16 DIAGNOSIS — H40019 Open angle with borderline findings, low risk, unspecified eye: Secondary | ICD-10-CM | POA: Diagnosis not present

## 2012-07-17 DIAGNOSIS — T7840XA Allergy, unspecified, initial encounter: Secondary | ICD-10-CM | POA: Diagnosis not present

## 2012-07-25 DIAGNOSIS — J309 Allergic rhinitis, unspecified: Secondary | ICD-10-CM | POA: Diagnosis not present

## 2012-08-01 DIAGNOSIS — J45909 Unspecified asthma, uncomplicated: Secondary | ICD-10-CM | POA: Diagnosis not present

## 2012-08-01 DIAGNOSIS — K219 Gastro-esophageal reflux disease without esophagitis: Secondary | ICD-10-CM | POA: Diagnosis not present

## 2012-08-01 DIAGNOSIS — R5381 Other malaise: Secondary | ICD-10-CM | POA: Diagnosis not present

## 2012-08-01 DIAGNOSIS — R5383 Other fatigue: Secondary | ICD-10-CM | POA: Diagnosis not present

## 2012-08-01 DIAGNOSIS — J3089 Other allergic rhinitis: Secondary | ICD-10-CM | POA: Diagnosis not present

## 2012-08-02 ENCOUNTER — Emergency Department (HOSPITAL_COMMUNITY): Payer: Medicare Other

## 2012-08-02 ENCOUNTER — Observation Stay (HOSPITAL_COMMUNITY)
Admission: EM | Admit: 2012-08-02 | Discharge: 2012-08-03 | DRG: 641 | Disposition: A | Discharge status: Home / Self Care | Payer: Medicare Other | Attending: Internal Medicine | Admitting: Internal Medicine

## 2012-08-02 ENCOUNTER — Encounter (HOSPITAL_COMMUNITY): Payer: Self-pay

## 2012-08-02 ENCOUNTER — Other Ambulatory Visit: Payer: Self-pay

## 2012-08-02 DIAGNOSIS — E861 Hypovolemia: Secondary | ICD-10-CM | POA: Insufficient documentation

## 2012-08-02 DIAGNOSIS — J45909 Unspecified asthma, uncomplicated: Secondary | ICD-10-CM | POA: Diagnosis present

## 2012-08-02 DIAGNOSIS — E860 Dehydration: Secondary | ICD-10-CM | POA: Diagnosis not present

## 2012-08-02 DIAGNOSIS — Z79899 Other long term (current) drug therapy: Secondary | ICD-10-CM | POA: Diagnosis not present

## 2012-08-02 DIAGNOSIS — K219 Gastro-esophageal reflux disease without esophagitis: Secondary | ICD-10-CM

## 2012-08-02 DIAGNOSIS — I10 Essential (primary) hypertension: Secondary | ICD-10-CM | POA: Diagnosis present

## 2012-08-02 DIAGNOSIS — E063 Autoimmune thyroiditis: Secondary | ICD-10-CM

## 2012-08-02 DIAGNOSIS — R05 Cough: Secondary | ICD-10-CM | POA: Diagnosis not present

## 2012-08-02 DIAGNOSIS — J449 Chronic obstructive pulmonary disease, unspecified: Secondary | ICD-10-CM | POA: Diagnosis not present

## 2012-08-02 DIAGNOSIS — Z8673 Personal history of transient ischemic attack (TIA), and cerebral infarction without residual deficits: Secondary | ICD-10-CM

## 2012-08-02 DIAGNOSIS — E785 Hyperlipidemia, unspecified: Secondary | ICD-10-CM | POA: Diagnosis not present

## 2012-08-02 DIAGNOSIS — D649 Anemia, unspecified: Secondary | ICD-10-CM

## 2012-08-02 DIAGNOSIS — R55 Syncope and collapse: Secondary | ICD-10-CM | POA: Diagnosis not present

## 2012-08-02 DIAGNOSIS — M79604 Pain in right leg: Secondary | ICD-10-CM

## 2012-08-02 DIAGNOSIS — Z87891 Personal history of nicotine dependence: Secondary | ICD-10-CM | POA: Diagnosis not present

## 2012-08-02 DIAGNOSIS — M79605 Pain in left leg: Secondary | ICD-10-CM

## 2012-08-02 DIAGNOSIS — R404 Transient alteration of awareness: Secondary | ICD-10-CM | POA: Diagnosis not present

## 2012-08-02 HISTORY — DX: Dorsalgia, unspecified: M54.9

## 2012-08-02 HISTORY — DX: Other chronic pain: G89.29

## 2012-08-02 HISTORY — DX: Cervicalgia: M54.2

## 2012-08-02 HISTORY — DX: Gastro-esophageal reflux disease without esophagitis: K21.9

## 2012-08-02 LAB — BASIC METABOLIC PANEL WITH GFR
BUN: 26 mg/dL — ABNORMAL HIGH (ref 6–23)
CO2: 27 meq/L (ref 19–32)
Calcium: 9.4 mg/dL (ref 8.4–10.5)
Chloride: 104 meq/L (ref 96–112)
Creatinine, Ser: 0.74 mg/dL (ref 0.50–1.10)
GFR calc Af Amer: >90 mL/min
GFR calc non Af Amer: 78 mL/min — ABNORMAL LOW
Glucose, Bld: 109 mg/dL — ABNORMAL HIGH (ref 70–99)
Potassium: 4 meq/L (ref 3.5–5.1)
Sodium: 138 meq/L (ref 135–145)

## 2012-08-02 LAB — CBC WITH DIFFERENTIAL/PLATELET
Basophils Absolute: 0 10*3/uL (ref 0.0–0.1)
Basophils Relative: 0 % (ref 0–1)
Eosinophils Absolute: 0.3 10*3/uL (ref 0.0–0.7)
Eosinophils Relative: 2 % (ref 0–5)
Lymphocytes Relative: 10 % — ABNORMAL LOW (ref 12–46)
MCH: 31 pg (ref 26.0–34.0)
MCHC: 34.1 g/dL (ref 30.0–36.0)
MCV: 90.9 fL (ref 78.0–100.0)
Monocytes Absolute: 1 10*3/uL (ref 0.1–1.0)
Platelets: 177 10*3/uL (ref 150–400)
RDW: 13.1 % (ref 11.5–15.5)
WBC: 14 10*3/uL — ABNORMAL HIGH (ref 4.0–10.5)

## 2012-08-02 LAB — URINALYSIS, ROUTINE W REFLEX MICROSCOPIC
Glucose, UA: NEGATIVE mg/dL
Hgb urine dipstick: NEGATIVE
Ketones, ur: NEGATIVE mg/dL
Leukocytes, UA: NEGATIVE
Nitrite: NEGATIVE
Protein, ur: NEGATIVE mg/dL
Specific Gravity, Urine: 1.02 (ref 1.005–1.030)
Urobilinogen, UA: 0.2 mg/dL (ref 0.0–1.0)
pH: 6 (ref 5.0–8.0)

## 2012-08-02 LAB — CARDIAC PANEL(CRET KIN+CKTOT+MB+TROPI)
Relative Index: 3.2 — ABNORMAL HIGH (ref 0.0–2.5)
Troponin I: <0.3 ng/mL (ref <0.30)

## 2012-08-02 LAB — TROPONIN I: Troponin I: <0.3 ng/mL (ref <0.30)

## 2012-08-02 LAB — D-DIMER, QUANTITATIVE: D-Dimer, Quant: 0.75 ug/mL-FEU — ABNORMAL HIGH (ref 0.00–0.48)

## 2012-08-02 MED ORDER — VITAMIN E 180 MG (400 UNIT) PO CAPS
400.0000 [IU] | ORAL_CAPSULE | Freq: Every day | ORAL | Status: DC
  Filled 2012-08-02 (×2): qty 1

## 2012-08-02 MED ORDER — ETODOLAC 300 MG PO CAPS
300.0000 mg | ORAL_CAPSULE | Freq: Two times a day (BID) | ORAL | Status: DC
  Filled 2012-08-02 (×4): qty 1

## 2012-08-02 MED ORDER — ONDANSETRON HCL 4 MG/2ML IJ SOLN: 4.0000 mg | Freq: Three times a day (TID) | INTRAMUSCULAR | Status: AC | PRN

## 2012-08-02 MED ORDER — ACETAMINOPHEN 325 MG PO TABS
650.0000 mg | ORAL_TABLET | Freq: Once | ORAL | Status: AC
  Administered 2012-08-02: 650 mg via ORAL
  Filled 2012-08-02: qty 2

## 2012-08-02 MED ORDER — MORPHINE SULFATE 2 MG/ML IJ SOLN: 1.0000 mg | INTRAMUSCULAR | Status: DC | PRN

## 2012-08-02 MED ORDER — ACETAMINOPHEN 650 MG RE SUPP: 650.0000 mg | Freq: Four times a day (QID) | RECTAL | Status: DC | PRN

## 2012-08-02 MED ORDER — AMLODIPINE BESYLATE 5 MG PO TABS
5.0000 mg | ORAL_TABLET | ORAL | Status: DC
  Administered 2012-08-03: 5 mg via ORAL
  Filled 2012-08-02: qty 1

## 2012-08-02 MED ORDER — ENOXAPARIN SODIUM 40 MG/0.4ML ~~LOC~~ SOLN: 40.0000 mg | SUBCUTANEOUS | Status: DC

## 2012-08-02 MED ORDER — ALBUTEROL SULFATE HFA 108 (90 BASE) MCG/ACT IN AERS: 2.0000 | INHALATION_SPRAY | Freq: Four times a day (QID) | RESPIRATORY_TRACT | Status: DC | PRN

## 2012-08-02 MED ORDER — VITAMIN B-12 1000 MCG PO TABS
1000.0000 ug | ORAL_TABLET | ORAL | Status: DC
  Administered 2012-08-03: 1000 ug via ORAL
  Filled 2012-08-02 (×3): qty 1

## 2012-08-02 MED ORDER — ONDANSETRON HCL 4 MG PO TABS: 4.0000 mg | ORAL_TABLET | Freq: Four times a day (QID) | ORAL | Status: DC | PRN

## 2012-08-02 MED ORDER — SENNA-DOCUSATE SODIUM 8.6-50 MG PO TABS
2.0000 | ORAL_TABLET | Freq: Every day | ORAL | Status: DC
  Filled 2012-08-02 (×2): qty 2

## 2012-08-02 MED ORDER — ACETAMINOPHEN 325 MG PO TABS: 650.0000 mg | ORAL_TABLET | Freq: Four times a day (QID) | ORAL | Status: DC | PRN

## 2012-08-02 MED ORDER — ONDANSETRON HCL 4 MG/2ML IJ SOLN: 4.0000 mg | Freq: Four times a day (QID) | INTRAMUSCULAR | Status: DC | PRN

## 2012-08-02 MED ORDER — LORATADINE 10 MG PO TABS
10.0000 mg | ORAL_TABLET | Freq: Every day | ORAL | Status: DC
  Administered 2012-08-02: 10 mg via ORAL
  Filled 2012-08-02: qty 1

## 2012-08-02 MED ORDER — LEVOTHYROXINE SODIUM 88 MCG PO TABS: 88.0000 ug | ORAL_TABLET | ORAL | Status: DC

## 2012-08-02 MED ORDER — SODIUM CHLORIDE 0.9 % IV SOLN: INTRAVENOUS | Status: DC

## 2012-08-02 MED ORDER — VITAMIN B-6 50 MG PO TABS
100.0000 mg | ORAL_TABLET | Freq: Every day | ORAL | Status: DC
  Administered 2012-08-02: 100 mg via ORAL
  Filled 2012-08-02: qty 2
  Filled 2012-08-02 (×2): qty 1

## 2012-08-02 MED ORDER — ASPIRIN 325 MG PO TABS
325.0000 mg | ORAL_TABLET | ORAL | Status: DC
  Administered 2012-08-03: 325 mg via ORAL
  Filled 2012-08-02: qty 1

## 2012-08-02 MED ORDER — PANTOPRAZOLE SODIUM 40 MG PO TBEC: 40.0000 mg | DELAYED_RELEASE_TABLET | Freq: Every day | ORAL | Status: DC

## 2012-08-02 MED ORDER — IOHEXOL 350 MG/ML SOLN
100.0000 mL | Freq: Once | INTRAVENOUS | Status: AC | PRN
  Administered 2012-08-02: 100 mL via INTRAVENOUS

## 2012-08-02 MED ORDER — SODIUM CHLORIDE 0.9 % IJ SOLN
3.0000 mL | Freq: Two times a day (BID) | INTRAMUSCULAR | Status: DC
  Administered 2012-08-02: 3 mL via INTRAVENOUS

## 2012-08-02 MED ORDER — LEVOCETIRIZINE DIHYDROCHLORIDE 5 MG PO TABS: 5.0000 mg | ORAL_TABLET | Freq: Every evening | ORAL | Status: DC

## 2012-08-02 MED ORDER — HYDROCODONE-ACETAMINOPHEN 5-325 MG PO TABS
1.0000 | ORAL_TABLET | ORAL | Status: DC | PRN
  Administered 2012-08-02 (×2): 1 via ORAL
  Filled 2012-08-02 (×2): qty 1

## 2012-08-02 MED ORDER — POTASSIUM CHLORIDE IN NACL 20-0.9 MEQ/L-% IV SOLN
INTRAVENOUS | Status: DC
  Administered 2012-08-02 – 2012-08-03 (×2): via INTRAVENOUS

## 2012-08-02 MED ORDER — CALCIUM CARBONATE-VITAMIN D 500-200 MG-UNIT PO TABS
1.0000 | ORAL_TABLET | Freq: Two times a day (BID) | ORAL | Status: DC
  Administered 2012-08-02: 1 via ORAL
  Filled 2012-08-02 (×6): qty 1

## 2012-08-02 MED ORDER — DIAZEPAM 5 MG PO TABS
2.5000 mg | ORAL_TABLET | Freq: Every day | ORAL | Status: DC | PRN
  Administered 2012-08-02: 2.5 mg via ORAL
  Filled 2012-08-02: qty 1

## 2012-08-02 MED ORDER — POTASSIUM CHLORIDE IN NACL 40-0.9 MEQ/L-% IV SOLN
INTRAVENOUS | Status: DC
  Filled 2012-08-02 (×5): qty 1000

## 2012-08-02 MED ORDER — SIMVASTATIN 20 MG PO TABS
40.0000 mg | ORAL_TABLET | Freq: Every day | ORAL | Status: DC
  Filled 2012-08-02: qty 1

## 2012-08-02 NOTE — ED Notes (Signed)
Per ems, pt became "lightheaded' after working outside.  Pt family reports that the pt sat down in a chair and then "blacked out".  Family denies the pt falling from chair.  ems reports upon arrival that the pt was un conscience.  ems reports using sternal rub to wake her up.  Pt is alert and oriented in triage.  Ems reports CBG 130.

## 2012-08-02 NOTE — H&P (Signed)
[facility] History and Physical  Christine Leblanc WUJ:[id number] DOB: November 29, 1929 DOA: 08/02/2012  Referring physician: Dr. Clarene Duke PCP: Kirk Ruths, MD   Chief Complaint: syncope  HPI:  This is a pleasant 78 year old female with history of hypertension, hyperlipidemia, lymphocytic thyroiditis status post thyroidectomy, asthma. Patient lives at home with her daughter and is very independent. She was in her usual state of health this morning when she was working in her garden. She had been outside for approximately 20 minutes and then had come back into the house. When she had gotten back into the house she felt very weak, became diaphoretic and nauseous. She sat in a chair and subsequently passed out. Her loss of consciousness was for a few minutes. After which she returned to her normal mental state. She denies any chest pain, shortness of breath. She did feel very lightheaded. There was no bowel or bladder incontinence. No tongue biting or vomiting. No muscle jerking was noted. EMS was called and blood sugar was found to be normal. She is brought to the emergency room for evaluation. She has been referred for admission  Review of Systems:  And positives as per history of present illness, otherwise negative  Past Medical History  Diagnosis Date  . Syncope and collapse 2007    Possible CVA in 2012 with left lower extremity weakness; refused hospitalization; CT-Atrophy and chronic microvascular ischemic change.   . Hypertension   . Asthma   . Degenerative joint disease     Left shoulder; cervical spine  . Gastroesophageal reflux disease     Hiatal hernia; distal esophageal web requiring dilatation; gastric polyps; gastritis; refuses colonoscopy  . Lymphocytic thyroiditis   . Multiple thyroid nodules 2010    Adenomatous; thyroidectomy in 2010  . Hyperlipidemia 04/27/2011  . Anemia   . GERD (gastroesophageal reflux disease)   . Chronic neck pain   . Chronic back pain    Past  Surgical History  Procedure Date  . Abdominal hysterectomy     fibroids  . Prolapsed uterine fibroid ligation   . Total thyroidectomy 2010  . Esophagogastroduodenoscopy 12/10    small hiatal ernia/gastric polyps/mild gastritis  . Cataract extraction     Bilateral  . Colonoscopy Remote  . Shoulder surgery     left   Social History:  reports that she quit smoking about 33 years ago. Her smoking use included Cigarettes. She has a 16 pack-year smoking history. She has never used smokeless tobacco. She reports that she does not drink alcohol or use illicit drugs. Lives independently with her daughter  Allergies  Allergen Reactions  . Celecoxib Shortness Of Breath  . Amoxicillin-Pot Clavulanate     AUGMENTIN: Unknown/ Upset stomach *Patient states that she takes Amoxicillin prior to dental procedures*    Family History  Problem Relation Age of Onset  . Heart disease Mother   . Gallbladder disease Mother   . Breast cancer Sister   . Colon cancer Neg Hx     Prior to Admission medications   Medication Sig Start Date End Date Taking? Authorizing Provider  albuterol (PROVENTIL HFA) 108 (90 BASE) MCG/ACT inhaler Inhale 2 puffs into the lungs every 6 (six) hours as needed. Wheezing, Asthma Symptoms   Yes Historical Provider, MD  amLODipine (NORVASC) 5 MG tablet Take 5 mg by mouth every morning.    Yes Historical Provider, MD  aspirin 325 MG tablet Take 325 mg by mouth every morning.    Yes Historical Provider, MD  calcium-vitamin D Ruthell Rummage  WITH D) 500-200 MG-UNIT per tablet Take 1 tablet by mouth 2 (two) times daily.    Yes Historical Provider, MD  dexlansoprazole (DEXILANT) 60 MG capsule Take 60 mg by mouth every morning. 11/08/11  Yes West Bali, MD  diazepam (VALIUM) 5 MG tablet Take 2.5 mg by mouth daily as needed. Sleep   Yes Historical Provider, MD  etodolac (LODINE) 300 MG capsule Take 300 mg by mouth 2 (two) times daily.   Yes Historical Provider, MD  Glucosamine-Chondroit-Vit  C-Mn (GLUCOSAMINE CHONDR 1500 COMPLX PO) Take 1 tablet by mouth 2 (two) times daily. INCLUDES: Chondroitin 1200mg  (1.2 g)   Yes Historical Provider, MD  levocetirizine (XYZAL) 5 MG tablet Take 5 mg by mouth every evening.     Yes Historical Provider, MD  levothyroxine (SYNTHROID, LEVOTHROID) 88 MCG tablet Take 88-132 mcg by mouth as directed. *Take one tablet (0.34mcg) by mouth daily except take one & one-half tablet (0.0126mcg) on Sundays only*   Yes Historical Provider, MD  Omega-3 Fatty Acids (FISH OIL) 1200 MG CAPS Take 1 capsule by mouth 2 (two) times daily.    Yes Historical Provider, MD  pravastatin (PRAVACHOL) 80 MG tablet Take 80 mg by mouth at bedtime. 04/28/12 04/28/13 Yes Kathlen Brunswick, MD  PRESCRIPTION MEDICATION Inject as directed once a week. Allergy Shot that's administered by a nurse weekly.    Yes Historical Provider, MD  pyridOXINE (VITAMIN B-6) 100 MG tablet Take 100 mg by mouth at bedtime.   Yes Historical Provider, MD  sennosides-docusate sodium (SENOKOT-S) 8.6-50 MG tablet Take 2 tablets by mouth at bedtime.   Yes Historical Provider, MD  vitamin B-12 (CYANOCOBALAMIN) 1000 MCG tablet Take 1,000 mcg by mouth every morning.   Yes Historical Provider, MD  vitamin E 400 UNIT capsule Take 400 Units by mouth at bedtime.    Yes Historical Provider, MD   Physical Exam: Filed Vitals:   08/02/12 1539 08/02/12 1540 08/02/12 1542 08/02/12 1815  BP: 139/69 142/81 151/72 144/70  Pulse: 75 85 82 86  Temp:      TempSrc:      Resp:    16  SpO2:    100%     General:  Sitting up in bed, does not appear to be in any distress  Eyes:  Pupils are equal round reactive to light, extraocular motions are intact  ENT: No pharyngeal erythema, mucous membranes are dry, no sinus tenderness  Neck: Supple  Cardiovascular: S1, S2, regular rate and rhythm  Respiratory: Clear to auscultation bilaterally  Abdomen: Soft, nontender, nondistended, bowel sounds are active  Skin: No visible  rashes  Musculoskeletal: Deferred  Psychiatric: Normal affect, cooperative with exam  Neurologic: Strength is 5 over 5 in the upper and lower extremities, cranial nerves II through XII are grossly intact, no facial asymmetry  Labs on Admission:  Basic Metabolic Panel:  Lab 08/02/12 1610  NA 138  K 4.0  CL 104  CO2 27  GLUCOSE 109*  BUN 26*  CREATININE 0.74  CALCIUM 9.4  MG --  PHOS --   Liver Function Tests: No results found for this basename: AST:5,ALT:5,ALKPHOS:5,BILITOT:5,PROT:5,ALBUMIN:5 in the last 168 hours No results found for this basename: LIPASE:5,AMYLASE:5 in the last 168 hours No results found for this basename: AMMONIA:5 in the last 168 hours CBC:  Lab 08/02/12 1530  WBC 14.0*  NEUTROABS 11.3*  HGB 11.9*  HCT 34.9*  MCV 90.9  PLT 177   Cardiac Enzymes:  Lab 08/02/12 1530  CKTOTAL --  CKMB --  CKMBINDEX --  TROPONINI <0.30    BNP (last 3 results) No results found for this basename: PROBNP:3 in the last 8760 hours CBG: No results found for this basename: GLUCAP:5 in the last 168 hours  Radiological Exams on Admission: Dg Chest 2 View  08/02/2012  *RADIOLOGY REPORT*  Clinical Data: Cough  CHEST - 2 VIEW  Comparison: 12/14/2011  Findings: The heart and pulmonary vascularity are within normal limits.  The lungs are free of acute infiltrate or sizable effusion.  No bony abnormality is seen.  A new left shoulder arthroplasty is noted.  IMPRESSION: No acute abnormality is seen.  Original Report Authenticated By: Phillips Odor, M.D.   Ct Head Wo Contrast  08/02/2012  *RADIOLOGY REPORT*  Clinical Data: Syncopal episode today.  Loss of consciousness.  CT HEAD WITHOUT CONTRAST  Technique:  Contiguous axial images were obtained from the base of the skull through the vertex without contrast.  Comparison: CT head without contrast 11/20/2011.  Findings: A lacunar infarct of the right putamen has evolved since the prior study.  This may have been acute at that time.   Patchy periventricular subcortical white matter hypoattenuation is otherwise stable.  Moderate generalized atrophy is similar to the prior exams.  No acute cortical infarct, hemorrhage, or mass lesion is present.  The ventricles are of normal size.  No significant extra-axial fluid collection is present.  A small polyp or mucous retention cyst in the right maxillary sinus is stable.  Scattered ethmoid opacification is similar to the prior study.  There is mild mucosal thickening in the right sphenoid sinus.  The paranasal sinuses and mastoid air cells are otherwise clear.  The osseous skull is intact.  IMPRESSION:  1.  Stable atrophy and diffuse white matter disease.  This is nonspecific, but likely reflects the sequelae of chronic microvascular ischemia. 2.  No acute intracranial abnormality. 3.  Similar appearance of chronic sinus disease.  Original Report Authenticated By: Jamesetta Orleans. MATTERN, M.D.   Ct Angio Chest Pe W/cm &/or Wo Cm  08/02/2012  *RADIOLOGY REPORT*  Clinical Data: Rule out PE.  Syncope  CT ANGIOGRAPHY CHEST  Technique:  Multidetector CT imaging of the chest using the standard protocol during bolus administration of intravenous contrast. Multiplanar reconstructed images including MIPs were obtained and reviewed to evaluate the vascular anatomy.  Contrast: OMNIPAQUE IOHEXOL 350 MG/ML SOLN  Comparison: None  Findings:   No axillary or supraclavicular adenopathy.  No mediastinal or hilar adenopathy.  No pericardial or pleural effusion.  No interstitial edema.  Biapical scarring is identified.  No suspicious pulmonary parenchymal nodule or mass identified.  Review of the visualized osseous structures is significant for thoracic spondylosis.  No worrisome lytic or sclerotic bone lesions identified.  The pulmonary arteries are patent.  There is no evidence for acute pulmonary embolus.  Limited imaging through the upper abdomen is unremarkable.  IMPRESSION:  1.  No evidence for acute  pulmonary embolus.  Original Report Authenticated By: Rosealee Albee, M.D.    EKG: Independently reviewed. Shows right bundle branch block, no change from prior EKG  Assessment/Plan Principal Problem:  *Syncope and collapse Active Problems:  ASTHMA  Hyperlipidemia  Hypertension  Dehydration   1. Syncope. Likely secondary to dehydration. We will admit the patient to a telemetry bed for observation. We will cycle cardiac markers, check a TSH as well as orthostatic vital signs. She'll receive IV fluids overnight. We will monitor for any cardiac arrhythmias. I think seizures  are unlikely in this case. Since she does not have any neurologic deficits, CVA would be unlikely. CT scan of the brain did not show any acute findings. With a normal neurologic exam, I do not feel MRI is needed. She does not have any focus of infection. Leukocytosis may be due to hemoconcentration. D-dimer was mildly elevated, but CT scan showed the chest was negative for pulmonary embolus. She's not been started on any new medication recently that would explain her syncope. 2. Asthma. Continue outpatient medications 3. Hypertension. Continue outpatient medications  Code Status: Full code Family Communication: Discussed with patient and daughter at the bedside Disposition Plan: If the patient's workup is unremarkable, then she can likely be discharged home tomorrow  Time spent:  [facility] Pager [phone]  If 7PM-7AM, please contact night-coverage [email or web address] Password [email or web address] 08/02/2012, 6:18 PM

## 2012-08-02 NOTE — ED Provider Notes (Signed)
History     CSN: [id number]  Arrival date & time 08/02/12  1404   First MD Initiated Contact with Patient 08/02/12 1413      Chief Complaint  Patient presents with  . Loss of Consciousness     HPI Pt was seen at 1420.  Per pt and family, c/o sudden onset and resolution of one episode of syncope that occurred PTA.  Pt states she was "spraying weeds outside" for approx 20 min or less this morning, came inside and was washing her hands when she began to feel "lightheaded."  Pt states she sat down in a chair and "doesn't remember anything after that."  Pt's family states she "passed out" for several minutes and was "drooling out of the corner of her mouth."  Denies seizure activity, no incont of bowel or bladder, no confusion upon awakening.  Pt denies CP/palpitatoins, no SOB, no abd pain, no back pain, no N/V/D, no focal motor weakness, no tingling/numbness in extremities.    Past Medical History  Diagnosis Date  . Syncope and collapse 2007    Possible CVA in 2012 with left lower extremity weakness; refused hospitalization; CT-Atrophy and chronic microvascular ischemic change.   . Hypertension   . Asthma   . Degenerative joint disease     Left shoulder; cervical spine  . Gastroesophageal reflux disease     Hiatal hernia; distal esophageal web requiring dilatation; gastric polyps; gastritis; refuses colonoscopy  . Lymphocytic thyroiditis   . Multiple thyroid nodules 2010    Adenomatous; thyroidectomy in 2010  . Hyperlipidemia 04/27/2011  . Anemia   . GERD (gastroesophageal reflux disease)   . Chronic neck pain   . Chronic back pain     Past Surgical History  Procedure Date  . Abdominal hysterectomy     fibroids  . Prolapsed uterine fibroid ligation   . Total thyroidectomy 2010  . Esophagogastroduodenoscopy 12/10    small hiatal ernia/gastric polyps/mild gastritis  . Cataract extraction     Bilateral  . Colonoscopy Remote  . Shoulder surgery     left    Family History    Problem Relation Age of Onset  . Heart disease Mother   . Gallbladder disease Mother   . Breast cancer Sister   . Colon cancer Neg Hx     History  Substance Use Topics  . Smoking status: Former Smoker -- 0.8 packs/day for 20 years    Types: Cigarettes    Quit date: 12/31/1978  . Smokeless tobacco: Never Used  . Alcohol Use: No    Review of Systems ROS: Statement: All systems negative except as marked or noted in the HPI; Constitutional: Negative for fever and chills. ; ; Eyes: Negative for eye pain, redness and discharge. ; ; ENMT: Negative for ear pain, hoarseness, nasal congestion, sinus pressure and sore throat. ; ; Cardiovascular: Negative for chest pain, palpitations, diaphoresis, dyspnea and peripheral edema. ; ; Respiratory: Negative for cough, wheezing and stridor. ; ; Gastrointestinal: Negative for nausea, vomiting, diarrhea, abdominal pain, blood in stool, hematemesis, jaundice and rectal bleeding. . ; ; Genitourinary: Negative for dysuria, flank pain and hematuria. ; ; Musculoskeletal: Negative for back pain and neck pain. Negative for swelling and trauma.; ; Skin: Negative for pruritus, rash, abrasions, blisters, bruising and skin lesion.; ; Neuro: Negative for headache and neck stiffness. Negative for altered mental status, extremity weakness, paresthesias, involuntary movement, seizure and +generalized weakness, lightheadedness, syncope.     Allergies  Celecoxib and Amoxicillin-pot clavulanate  Home  Medications   Current Outpatient Rx  Name Route Sig Dispense Refill  . ALBUTEROL SULFATE HFA 108 (90 BASE) MCG/ACT IN AERS Inhalation Inhale 2 puffs into the lungs every 6 (six) hours as needed. Wheezing, Asthma Symptoms    . AMLODIPINE BESYLATE 5 MG PO TABS Oral Take 5 mg by mouth every morning.     . ASPIRIN 325 MG PO TABS Oral Take 325 mg by mouth every morning.     Marland Kitchen CALCIUM CARBONATE-VITAMIN D 500-200 MG-UNIT PO TABS Oral Take 1 tablet by mouth 2 (two) times daily.      . DEXLANSOPRAZOLE 60 MG PO CPDR Oral Take 60 mg by mouth every morning.    Marland Kitchen DIAZEPAM 5 MG PO TABS Oral Take 2.5 mg by mouth daily as needed. Sleep    . ETODOLAC 300 MG PO CAPS Oral Take 300 mg by mouth 2 (two) times daily.    Marland Kitchen GLUCOSAMINE CHONDR 1500 COMPLX PO Oral Take 1 tablet by mouth 2 (two) times daily. INCLUDES: Chondroitin 1200mg  (1.2 g)    . LEVOCETIRIZINE DIHYDROCHLORIDE 5 MG PO TABS Oral Take 5 mg by mouth every evening.      Marland Kitchen LEVOTHYROXINE SODIUM 88 MCG PO TABS Oral Take 88-132 mcg by mouth as directed. *Take one tablet (0.8mcg) by mouth daily except take one & one-half tablet (0.015mcg) on Sundays only*    . FISH OIL 1200 MG PO CAPS Oral Take 1 capsule by mouth 2 (two) times daily.     Marland Kitchen PRAVASTATIN SODIUM 80 MG PO TABS Oral Take 80 mg by mouth at bedtime.    Marland Kitchen PRESCRIPTION MEDICATION Injection Inject as directed once a week. Allergy Shot that's administered by a nurse weekly.     Marland Kitchen VITAMIN B-6 100 MG PO TABS Oral Take 100 mg by mouth at bedtime.    . SENNA-DOCUSATE SODIUM 8.6-50 MG PO TABS Oral Take 2 tablets by mouth at bedtime.    Marland Kitchen VITAMIN B-12 1000 MCG PO TABS Oral Take 1,000 mcg by mouth every morning.    Marland Kitchen VITAMIN E 400 UNITS PO CAPS Oral Take 400 Units by mouth at bedtime.       BP 151/72  Pulse 82  Temp 97.7 F (36.5 C) (Oral)  Resp 16  SpO2 99%  Physical Exam 1420: Physical examination:  Nursing notes reviewed; Vital signs and O2 SAT reviewed;  Constitutional: Well developed, Well nourished, Well hydrated, In no acute distress; Head:  Normocephalic, atraumatic; Eyes: EOMI, PERRL, No scleral icterus; ENMT: Mouth and pharynx normal, Mucous membranes moist; Neck: Supple, Full range of motion, No lymphadenopathy; Cardiovascular: Regular rate and rhythm, No gallop; Respiratory: Breath sounds clear & equal bilaterally, No wheezes.  Speaking full sentences with ease, Normal respiratory effort/excursion; Chest: Nontender, Movement normal; Abdomen: Soft, Nontender,  Nondistended, Normal bowel sounds;; Extremities: Pulses normal, No tenderness, No edema, No calf edema or asymmetry.; Neuro: AA&Ox3, Major CN grossly intact.  Strength 5/5 equal bilat UE's and LE's.  DTR 2/4 equal bilat UE's and LE's.  No gross sensory deficits.  Normal cerebellar testing bilat UE's (finger-nose) and LE's (heel-shin). Speech clear.  No facial droop.  No nystagmus.;;.; Skin: Color normal, Warm, Dry.   ED Course  Procedures   MDM  MDM Reviewed: nursing note, vitals and previous chart Reviewed previous: ECG Interpretation: ECG, labs, x-ray and CT scan    Date: 08/02/2012  Rate: 66  Rhythm: normal sinus rhythm  QRS Axis: normal  Intervals: normal  ST/T Wave abnormalities: normal  Conduction Disutrbances:right  bundle branch block  Narrative Interpretation:   Old EKG Reviewed: unchanged; no significant changes from previous EKG dated 11/20/2011.  Results for orders placed during the hospital encounter of 08/02/12  CBC WITH DIFFERENTIAL      Component Value Range   WBC 14.0 (*) 4.0 - 10.5 K/uL   RBC 3.84 (*) 3.87 - 5.11 MIL/uL   Hemoglobin 11.9 (*) 12.0 - 15.0 g/dL   HCT 29.5 (*) 62.1 - 30.8 %   MCV 90.9  78.0 - 100.0 fL   MCH 31.0  26.0 - 34.0 pg   MCHC 34.1  30.0 - 36.0 g/dL   RDW 65.7  84.6 - 96.2 %   Platelets 177  150 - 400 K/uL   Neutrophils Relative 80 (*) 43 - 77 %   Neutro Abs 11.3 (*) 1.7 - 7.7 K/uL   Lymphocytes Relative 10 (*) 12 - 46 %   Lymphs Abs 1.4  0.7 - 4.0 K/uL   Monocytes Relative 7  3 - 12 %   Monocytes Absolute 1.0  0.1 - 1.0 K/uL   Eosinophils Relative 2  0 - 5 %   Eosinophils Absolute 0.3  0.0 - 0.7 K/uL   Basophils Relative 0  0 - 1 %   Basophils Absolute 0.0  0.0 - 0.1 K/uL  BASIC METABOLIC PANEL      Component Value Range   Sodium 138  135 - 145 mEq/L   Potassium 4.0  3.5 - 5.1 mEq/L   Chloride 104  96 - 112 mEq/L   CO2 27  19 - 32 mEq/L   Glucose, Bld 109 (*) 70 - 99 mg/dL   BUN 26 (*) 6 - 23 mg/dL   Creatinine, Ser 9.52   0.50 - 1.10 mg/dL   Calcium 9.4  8.4 - 84.1 mg/dL   GFR calc non Af Amer 78 (*) >90 mL/min   GFR calc Af Amer >90  >90 mL/min  URINALYSIS, ROUTINE W REFLEX MICROSCOPIC      Component Value Range   Color, Urine YELLOW  YELLOW   APPearance CLEAR  CLEAR   Specific Gravity, Urine 1.020  1.005 - 1.030   pH 6.0  5.0 - 8.0   Glucose, UA NEGATIVE  NEGATIVE mg/dL   Hgb urine dipstick NEGATIVE  NEGATIVE   Bilirubin Urine MODERATE (*) NEGATIVE   Ketones, ur NEGATIVE  NEGATIVE mg/dL   Protein, ur NEGATIVE  NEGATIVE mg/dL   Urobilinogen, UA 0.2  0.0 - 1.0 mg/dL   Nitrite NEGATIVE  NEGATIVE   Leukocytes, UA NEGATIVE  NEGATIVE  TROPONIN I      Component Value Range   Troponin I <0.30  <0.30 ng/mL  D-DIMER, QUANTITATIVE      Component Value Range   D-Dimer, Quant 0.75 (*) 0.00 - 0.48 ug/mL-FEU   Dg Chest 2 View 08/02/2012  *RADIOLOGY REPORT*  Clinical Data: Cough  CHEST - 2 VIEW  Comparison: 12/14/2011  Findings: The heart and pulmonary vascularity are within normal limits.  The lungs are free of acute infiltrate or sizable effusion.  No bony abnormality is seen.  A new left shoulder arthroplasty is noted.  IMPRESSION: No acute abnormality is seen.  Original Report Authenticated By: Phillips Odor, M.D.   Ct Head Wo Contrast 08/02/2012  *RADIOLOGY REPORT*  Clinical Data: Syncopal episode today.  Loss of consciousness.  CT HEAD WITHOUT CONTRAST  Technique:  Contiguous axial images were obtained from the base of the skull through the vertex without contrast.  Comparison: CT  head without contrast 11/20/2011.  Findings: A lacunar infarct of the right putamen has evolved since the prior study.  This may have been acute at that time.  Patchy periventricular subcortical white matter hypoattenuation is otherwise stable.  Moderate generalized atrophy is similar to the prior exams.  No acute cortical infarct, hemorrhage, or mass lesion is present.  The ventricles are of normal size.  No significant extra-axial fluid  collection is present.  A small polyp or mucous retention cyst in the right maxillary sinus is stable.  Scattered ethmoid opacification is similar to the prior study.  There is mild mucosal thickening in the right sphenoid sinus.  The paranasal sinuses and mastoid air cells are otherwise clear.  The osseous skull is intact.  IMPRESSION:  1.  Stable atrophy and diffuse white matter disease.  This is nonspecific, but likely reflects the sequelae of chronic microvascular ischemia. 2.  No acute intracranial abnormality. 3.  Similar appearance of chronic sinus disease.  Original Report Authenticated By: Jamesetta Orleans. MATTERN, M.D.   Ct Angio Chest Pe W/cm &/or Wo Cm 08/02/2012  *RADIOLOGY REPORT*  Clinical Data: Rule out PE.  Syncope  CT ANGIOGRAPHY CHEST  Technique:  Multidetector CT imaging of the chest using the standard protocol during bolus administration of intravenous contrast. Multiplanar reconstructed images including MIPs were obtained and reviewed to evaluate the vascular anatomy.  Contrast: OMNIPAQUE IOHEXOL 350 MG/ML SOLN  Comparison: None  Findings:   No axillary or supraclavicular adenopathy.  No mediastinal or hilar adenopathy.  No pericardial or pleural effusion.  No interstitial edema.  Biapical scarring is identified.  No suspicious pulmonary parenchymal nodule or mass identified.  Review of the visualized osseous structures is significant for thoracic spondylosis.  No worrisome lytic or sclerotic bone lesions identified.  The pulmonary arteries are patent.  There is no evidence for acute pulmonary embolus.  Limited imaging through the upper abdomen is unremarkable.  IMPRESSION:  1.  No evidence for acute pulmonary embolus.  Original Report Authenticated By: Rosealee Albee, M.D.   Results for MAGALINE, STEINBERG (MRN [id number]) as of 08/02/2012 17:54  Ref. Range 07/12/2010 06:33 07/13/2010 06:04 11/20/2011 13:30 08/02/2012 15:30  Hemoglobin Latest Range: 12.0-15.0 g/dL 8.9 (L) 9.4 (L) 40.9 (L) 11.9  (L)  HCT Latest Range: 36.0-46.0 % 26.2 (L) 27.7 (L) 32.8 (L) 34.9 (L)     1745:  Pt is not orthostatic.  No PE on CT-A chest.  EKG without acute ST elevation.  H/H improved from baseline. Dx testing d/w pt and family.  Questions answered.  Verb understanding, agreeable to admit.  T/C to [facility] Dr. Kerry Hough, case discussed, including:  HPI, pertinent PM/SHx, VS/PE, dx testing, ED course and treatment:  Agreeable to admit, requests to write temporary orders, obtain tele bed to team 2.         Laray Anger, DO 08/04/12 1357

## 2012-08-03 DIAGNOSIS — J45909 Unspecified asthma, uncomplicated: Secondary | ICD-10-CM

## 2012-08-03 DIAGNOSIS — E860 Dehydration: Secondary | ICD-10-CM | POA: Diagnosis not present

## 2012-08-03 DIAGNOSIS — R55 Syncope and collapse: Secondary | ICD-10-CM | POA: Diagnosis not present

## 2012-08-03 LAB — CBC
MCHC: 34.2 g/dL (ref 30.0–36.0)
Platelets: 169 10*3/uL (ref 150–400)
RDW: 13.1 % (ref 11.5–15.5)

## 2012-08-03 LAB — CARDIAC PANEL(CRET KIN+CKTOT+MB+TROPI)
CK, MB: 3 ng/mL (ref 0.3–4.0)
CK, MB: 2.8 ng/mL (ref 0.3–4.0)
Total CK: 82 U/L (ref 7–177)
Total CK: 77 U/L (ref 7–177)
Troponin I: <0.3 ng/mL (ref <0.30)

## 2012-08-03 LAB — BASIC METABOLIC PANEL
BUN: 20 mg/dL (ref 6–23)
Calcium: 8.9 mg/dL (ref 8.4–10.5)
Creatinine, Ser: 0.62 mg/dL (ref 0.50–1.10)
GFR calc Af Amer: >90 mL/min (ref >90)
GFR calc non Af Amer: 82 mL/min — ABNORMAL LOW (ref >90)
Potassium: 3.7 mEq/L (ref 3.5–5.1)

## 2012-08-03 LAB — URINE CULTURE: Colony Count: NO GROWTH

## 2012-08-03 MED ORDER — ATORVASTATIN CALCIUM 20 MG PO TABS: 20.0000 mg | ORAL_TABLET | Freq: Every day | ORAL | Status: DC

## 2012-08-03 MED ORDER — LEVOTHYROXINE SODIUM 88 MCG PO TABS
132.0000 ug | ORAL_TABLET | ORAL | Status: DC
  Administered 2012-08-03: 132 ug via ORAL
  Filled 2012-08-03: qty 1.5

## 2012-08-03 MED ORDER — ETODOLAC 300 MG PO CAPS
300.0000 mg | ORAL_CAPSULE | Freq: Two times a day (BID) | ORAL | Status: DC
  Filled 2012-08-03: qty 1

## 2012-08-03 MED ORDER — LEVOTHYROXINE SODIUM 88 MCG PO TABS
88.0000 ug | ORAL_TABLET | ORAL | Status: DC
  Filled 2012-08-03: qty 1

## 2012-08-03 MED ORDER — ETODOLAC 200 MG PO CAPS
200.0000 mg | ORAL_CAPSULE | Freq: Three times a day (TID) | ORAL | Status: DC
  Administered 2012-08-03: 200 mg via ORAL
  Filled 2012-08-03 (×3): qty 1

## 2012-08-03 NOTE — Progress Notes (Signed)
Pt's IV removed.  Tolerated well.  Pt provided with discharge instructions.  Pt verbalized understanding of d/c instructions.  Family here to transport pt.

## 2012-08-03 NOTE — Discharge Summary (Signed)
Physician Discharge Summary  Christine Leblanc OZH:[id number] DOB: 1929/03/21 DOA: 08/02/2012  PCP: Kirk Ruths, MD  Admit date: 08/02/2012 Discharge date: 08/03/2012  Discharge Diagnoses:  1. Syncopal episode secondary to hypovolemia secondary to dehydration, resolved. Serial cardiac enzymes negative. No evidence of myocardial ischemia or infarction. 2. Hypertension. Stable. 3. Asthma, stable.   Discharge Condition: Improved and stable.  Diet recommendation: Regular.  Wt Readings from Last 3 Encounters:  08/02/12 62.4 kg (137 lb 9.1 oz)  04/28/12 61.689 kg (136 lb)  12/07/11 60.328 kg (133 lb)    History of present illness:  This very pleasant 78 year old lady was admitted to the hospital yesterday having had a syncopal episode at home. She had been working in her garden and had been outside for least 20-30 minutes. When she came back into the house she became rather weak, nauseous and somewhat diaphoretic. She said that in a chair and subsequently passed out.  Hospital Course:  She was admitted to the hospital overnight. Serial cardiac enzymes are negative. She was found to be clinically and biochemically dehydrated and she was given intravenous fluids. She has done well and she feels much improved now. She understands the need to drink plenty of fluids.  Procedures:  None.  Consultations:  None.  Discharge Exam: Filed Vitals:   08/03/12 0722  BP: 154/83  Pulse: 80  Temp: 97.7 F (36.5 C)  Resp: 20   Filed Vitals:   08/02/12 1952 08/03/12 0520 08/03/12 0718 08/03/12 0722  BP:  125/69 150/76 154/83  Pulse:  71 76 80  Temp:  97.7 F (36.5 C) 97.4 F (36.3 C) 97.7 F (36.5 C)  TempSrc:      Resp:  20 20 20   Height:      Weight:      SpO2: 97% 97% 97% 98%    General: Looks systemically well. Not clinically dehydrated. Cardiovascular: Heart sounds are present and normal without murmurs. No gallop rhythm. Respiratory: Lung fields are clear. She is alert and  orientated without any focal neurological signs.  Discharge Instructions  Discharge Orders    Future Orders Please Complete By Expires   Diet - low sodium heart healthy      Increase activity slowly        Medication List  As of 08/03/2012 10:49 AM   TAKE these medications         amLODipine 5 MG tablet   Commonly known as: NORVASC   Take 5 mg by mouth every morning.      aspirin 325 MG tablet   Take 325 mg by mouth every morning.      calcium-vitamin D 500-200 MG-UNIT per tablet   Commonly known as: OSCAL WITH D   Take 1 tablet by mouth 2 (two) times daily.      dexlansoprazole 60 MG capsule   Commonly known as: DEXILANT   Take 60 mg by mouth every morning.      diazepam 5 MG tablet   Commonly known as: VALIUM   Take 2.5 mg by mouth daily as needed. Sleep      etodolac 300 MG capsule   Commonly known as: LODINE   Take 300 mg by mouth 2 (two) times daily.      Fish Oil 1200 MG Caps   Take 1 capsule by mouth 2 (two) times daily.      GLUCOSAMINE CHONDR 1500 COMPLX PO   Take 1 tablet by mouth 2 (two) times daily. INCLUDES: Chondroitin 1200mg  (1.2 g)  levocetirizine 5 MG tablet   Commonly known as: XYZAL   Take 5 mg by mouth every evening.      levothyroxine 88 MCG tablet   Commonly known as: SYNTHROID, LEVOTHROID   Take 88-132 mcg by mouth daily. Take one tablet ( ) daily except on Sundays take one and one-half tablet ( ).      pravastatin 80 MG tablet   Commonly known as: PRAVACHOL   Take 80 mg by mouth at bedtime.      PRESCRIPTION MEDICATION   Inject as directed once a week. Allergy Shot that's administered by a nurse weekly.      PROVENTIL HFA 108 (90 BASE) MCG/ACT inhaler   Generic drug: albuterol   Inhale 2 puffs into the lungs every 6 (six) hours as needed. Wheezing, Asthma Symptoms      pyridOXINE 100 MG tablet   Commonly known as: VITAMIN B-6   Take 100 mg by mouth at bedtime.      sennosides-docusate sodium 8.6-50 MG tablet    Commonly known as: SENOKOT-S   Take 2 tablets by mouth at bedtime.      vitamin B-12 1000 MCG tablet   Commonly known as: CYANOCOBALAMIN   Take 1,000 mcg by mouth every morning.      vitamin E 400 UNIT capsule   Take 400 Units by mouth at bedtime.              The results of significant diagnostics from this hospitalization (including imaging, microbiology, ancillary and laboratory) are listed below for reference.    Significant Diagnostic Studies: Dg Chest 2 View  08/02/2012  *RADIOLOGY REPORT*  Clinical Data: Cough  CHEST - 2 VIEW  Comparison: 12/14/2011  Findings: The heart and pulmonary vascularity are within normal limits.  The lungs are free of acute infiltrate or sizable effusion.  No bony abnormality is seen.  A new left shoulder arthroplasty is noted.  IMPRESSION: No acute abnormality is seen.  Original Report Authenticated By: Phillips Odor, M.D.   Ct Head Wo Contrast  08/02/2012  *RADIOLOGY REPORT*  Clinical Data: Syncopal episode today.  Loss of consciousness.  CT HEAD WITHOUT CONTRAST  Technique:  Contiguous axial images were obtained from the base of the skull through the vertex without contrast.  Comparison: CT head without contrast 11/20/2011.  Findings: A lacunar infarct of the right putamen has evolved since the prior study.  This may have been acute at that time.  Patchy periventricular subcortical white matter hypoattenuation is otherwise stable.  Moderate generalized atrophy is similar to the prior exams.  No acute cortical infarct, hemorrhage, or mass lesion is present.  The ventricles are of normal size.  No significant extra-axial fluid collection is present.  A small polyp or mucous retention cyst in the right maxillary sinus is stable.  Scattered ethmoid opacification is similar to the prior study.  There is mild mucosal thickening in the right sphenoid sinus.  The paranasal sinuses and mastoid air cells are otherwise clear.  The osseous skull is intact.  IMPRESSION:   1.  Stable atrophy and diffuse white matter disease.  This is nonspecific, but likely reflects the sequelae of chronic microvascular ischemia. 2.  No acute intracranial abnormality. 3.  Similar appearance of chronic sinus disease.  Original Report Authenticated By: Jamesetta Orleans. MATTERN, M.D.   Ct Angio Chest Pe W/cm &/or Wo Cm  08/02/2012  *RADIOLOGY REPORT*  Clinical Data: Rule out PE.  Syncope  CT ANGIOGRAPHY CHEST  Technique:  Multidetector CT  imaging of the chest using the standard protocol during bolus administration of intravenous contrast. Multiplanar reconstructed images including MIPs were obtained and reviewed to evaluate the vascular anatomy.  Contrast: OMNIPAQUE IOHEXOL 350 MG/ML SOLN  Comparison: None  Findings:   No axillary or supraclavicular adenopathy.  No mediastinal or hilar adenopathy.  No pericardial or pleural effusion.  No interstitial edema.  Biapical scarring is identified.  No suspicious pulmonary parenchymal nodule or mass identified.  Review of the visualized osseous structures is significant for thoracic spondylosis.  No worrisome lytic or sclerotic bone lesions identified.  The pulmonary arteries are patent.  There is no evidence for acute pulmonary embolus.  Limited imaging through the upper abdomen is unremarkable.  IMPRESSION:  1.  No evidence for acute pulmonary embolus.  Original Report Authenticated By: Rosealee Albee, M.D.        Labs: Basic Metabolic Panel:  Lab 08/03/12 1610 08/02/12 1530  NA 135 138  K 3.7 4.0  CL 103 104  CO2 27 27  GLUCOSE 105* 109*  BUN 20 26*  CREATININE 0.62 0.74  CALCIUM 8.9 9.4  MG -- --  PHOS -- --       CBC:  Lab 08/03/12 0236 08/02/12 1530  WBC 9.9 14.0*  NEUTROABS -- 11.3*  HGB 11.5* 11.9*  HCT 33.6* 34.9*  MCV 90.3 90.9  PLT 169 177   Cardiac Enzymes:  Lab 08/03/12 0946 08/03/12 0236 08/02/12 1835 08/02/12 1530  CKTOTAL 77 82 136 --  CKMB 2.8 3.0 4.4* --  CKMBINDEX -- -- -- --  TROPONINI <0.30  <0.30 <0.30 <0.30      Time coordinating discharge: Less than 30 minutes  Signed:  Kasean Denherder C  [facility] 08/03/2012, 10:49 AM

## 2012-08-04 DIAGNOSIS — T7840XA Allergy, unspecified, initial encounter: Secondary | ICD-10-CM | POA: Diagnosis not present

## 2012-08-12 DIAGNOSIS — J309 Allergic rhinitis, unspecified: Secondary | ICD-10-CM | POA: Diagnosis not present

## 2012-08-22 DIAGNOSIS — J309 Allergic rhinitis, unspecified: Secondary | ICD-10-CM | POA: Diagnosis not present

## 2012-08-28 NOTE — Progress Notes (Signed)
UR Chart Review Completed  

## 2012-08-29 DIAGNOSIS — J309 Allergic rhinitis, unspecified: Secondary | ICD-10-CM | POA: Diagnosis not present

## 2012-09-05 DIAGNOSIS — J309 Allergic rhinitis, unspecified: Secondary | ICD-10-CM | POA: Diagnosis not present

## 2012-09-12 DIAGNOSIS — M159 Polyosteoarthritis, unspecified: Secondary | ICD-10-CM | POA: Diagnosis not present

## 2012-09-12 DIAGNOSIS — Z0000 Encounter for general adult medical examination without abnormal findings: Secondary | ICD-10-CM | POA: Diagnosis not present

## 2012-09-12 DIAGNOSIS — I10 Essential (primary) hypertension: Secondary | ICD-10-CM | POA: Diagnosis not present

## 2012-09-12 DIAGNOSIS — F411 Generalized anxiety disorder: Secondary | ICD-10-CM | POA: Diagnosis not present

## 2012-09-12 DIAGNOSIS — IMO0002 Reserved for concepts with insufficient information to code with codable children: Secondary | ICD-10-CM | POA: Diagnosis not present

## 2012-09-16 ENCOUNTER — Other Ambulatory Visit: Payer: Self-pay | Admitting: Gastroenterology

## 2012-09-16 NOTE — Telephone Encounter (Signed)
Pt due for 1 yr FU.  Please schedule.

## 2012-09-18 ENCOUNTER — Encounter: Payer: Self-pay | Admitting: Gastroenterology

## 2012-09-18 NOTE — Telephone Encounter (Signed)
Pt aware of OV on 10/31 @ 10 with SF and appt card was mailed

## 2012-09-19 DIAGNOSIS — T7840XA Allergy, unspecified, initial encounter: Secondary | ICD-10-CM | POA: Diagnosis not present

## 2012-09-29 DIAGNOSIS — J309 Allergic rhinitis, unspecified: Secondary | ICD-10-CM | POA: Diagnosis not present

## 2012-10-07 DIAGNOSIS — E785 Hyperlipidemia, unspecified: Secondary | ICD-10-CM | POA: Diagnosis not present

## 2012-10-07 DIAGNOSIS — E039 Hypothyroidism, unspecified: Secondary | ICD-10-CM | POA: Diagnosis not present

## 2012-10-07 DIAGNOSIS — I10 Essential (primary) hypertension: Secondary | ICD-10-CM | POA: Diagnosis not present

## 2012-10-07 DIAGNOSIS — K219 Gastro-esophageal reflux disease without esophagitis: Secondary | ICD-10-CM | POA: Diagnosis not present

## 2012-10-07 DIAGNOSIS — J209 Acute bronchitis, unspecified: Secondary | ICD-10-CM | POA: Diagnosis not present

## 2012-10-08 DIAGNOSIS — D235 Other benign neoplasm of skin of trunk: Secondary | ICD-10-CM | POA: Diagnosis not present

## 2012-10-08 DIAGNOSIS — Z85828 Personal history of other malignant neoplasm of skin: Secondary | ICD-10-CM | POA: Diagnosis not present

## 2012-10-08 DIAGNOSIS — I781 Nevus, non-neoplastic: Secondary | ICD-10-CM | POA: Diagnosis not present

## 2012-10-08 DIAGNOSIS — L821 Other seborrheic keratosis: Secondary | ICD-10-CM | POA: Diagnosis not present

## 2012-10-13 DIAGNOSIS — T7840XA Allergy, unspecified, initial encounter: Secondary | ICD-10-CM | POA: Diagnosis not present

## 2012-10-23 DIAGNOSIS — J309 Allergic rhinitis, unspecified: Secondary | ICD-10-CM | POA: Diagnosis not present

## 2012-10-30 ENCOUNTER — Telehealth: Payer: Self-pay | Admitting: Gastroenterology

## 2012-10-30 ENCOUNTER — Encounter: Payer: Self-pay | Admitting: Gastroenterology

## 2012-10-30 ENCOUNTER — Ambulatory Visit (INDEPENDENT_AMBULATORY_CARE_PROVIDER_SITE_OTHER): Payer: Medicare Other | Admitting: Gastroenterology

## 2012-10-30 ENCOUNTER — Ambulatory Visit: Payer: Medicare Other | Admitting: Gastroenterology

## 2012-10-30 VITALS — BP 129/77 | HR 78 | Temp 97.0°F | Ht 61.0 in | Wt 136.4 lb

## 2012-10-30 DIAGNOSIS — K219 Gastro-esophageal reflux disease without esophagitis: Secondary | ICD-10-CM

## 2012-10-30 MED ORDER — DEXLANSOPRAZOLE 60 MG PO CPDR: 60.0000 mg | DELAYED_RELEASE_CAPSULE | Freq: Every day | ORAL | Status: DC

## 2012-10-30 NOTE — Patient Instructions (Addendum)
Continue taking Dexilant daily. I have sent a prescription for a year's supply to the pharmacy.  Call us if you have any problems such as worsening reflux, abdominal pain, nausea, weight loss, or problems swallowing. Otherwise, we will see you back in 2 years.

## 2012-10-30 NOTE — Telephone Encounter (Signed)
Patient was a no-show 

## 2012-10-30 NOTE — Assessment & Plan Note (Signed)
78 year old female with hx of GERD, doing well on Dexilant daily. Wt is overall stable and ranging from the low 130s to max of 140. States this is her baseline. Hx of distal esophageal web requiring dilation; however, she is at baseline. Only notes difficulty with large pills but no other issues. Continues to refuse colonoscopy despite our recommendations. At this time, appropriate to return in 2 years barring any issues in the interim.

## 2012-10-30 NOTE — Telephone Encounter (Signed)
Pt showed up at 215pm and I reminded her her OV was at 130pm. She said she was told 230pm. Since we had a cancellation at 230 with AS I added patient back onto schedule to be seen today

## 2012-10-30 NOTE — Progress Notes (Signed)
Faxed to PCP

## 2012-10-30 NOTE — Progress Notes (Signed)
Referring Provider: Karleen Hampshire, MD Primary Care Physician:  Kirk Ruths, MD Primary Gastroenterologist: Dr. Darrick Penna   Chief Complaint  Patient presents with  . Follow-up    HPI:   Yearly follow-up. Hx of GERD, dysphagia. Notes difficulty with large pills but still at baseline, no worsening. GERD controlled with Dexilant. No abdominal pain. No rectal bleeding, no melena. No significant wt change. Lost 4 inches. States her ribs and pelvis meet because she is losing height. Refusing colonoscopy.   Past Medical History  Diagnosis Date  . Syncope and collapse 2007    Possible CVA in 2012 with left lower extremity weakness; refused hospitalization; CT-Atrophy and chronic microvascular ischemic change.   . Hypertension   . Asthma   . Degenerative joint disease     Left shoulder; cervical spine  . Gastroesophageal reflux disease     Hiatal hernia; distal esophageal web requiring dilatation; gastric polyps; gastritis; refuses colonoscopy  . Lymphocytic thyroiditis   . Multiple thyroid nodules 2010    Adenomatous; thyroidectomy in 2010  . Hyperlipidemia 04/27/2011  . Anemia   . GERD (gastroesophageal reflux disease)   . Chronic neck pain   . Chronic back pain     Past Surgical History  Procedure Date  . Abdominal hysterectomy     fibroids  . Prolapsed uterine fibroid ligation   . Total thyroidectomy 2010  . Esophagogastroduodenoscopy 12/10    small hiatal ernia/gastric polyps/mild gastritis  . Cataract extraction     Bilateral  . Colonoscopy Remote  . Shoulder surgery     left    Current Outpatient Prescriptions  Medication Sig Dispense Refill  . albuterol (PROVENTIL HFA) 108 (90 BASE) MCG/ACT inhaler Inhale 2 puffs into the lungs every 6 (six) hours as needed. Wheezing, Asthma Symptoms      . amLODipine (NORVASC) 5 MG tablet Take 5 mg by mouth every morning.       Marland Kitchen aspirin 325 MG tablet Take 325 mg by mouth every morning.       . calcium-vitamin D (OSCAL WITH D)  500-200 MG-UNIT per tablet Take 1 tablet by mouth 2 (two) times daily.       Marland Kitchen DEXILANT 60 MG capsule TAKE 1 CAPSULE BY MOUTH DAILY  31 capsule  1  . diazepam (VALIUM) 5 MG tablet Take 2.5 mg by mouth daily as needed. Sleep      . etodolac (LODINE) 300 MG capsule Take 300 mg by mouth 2 (two) times daily.      . Glucosamine-Chondroit-Vit C-Mn (GLUCOSAMINE CHONDR 1500 COMPLX PO) Take 1 tablet by mouth 2 (two) times daily. INCLUDES: Chondroitin 1200mg  (1.2 g)      . levocetirizine (XYZAL) 5 MG tablet Take 5 mg by mouth every evening.        Marland Kitchen levothyroxine (SYNTHROID, LEVOTHROID) 88 MCG tablet Take 88-132 mcg by mouth daily. Take one tablet ( ) daily except on Sundays take one and one-half tablet ( ).      . Omega-3 Fatty Acids (FISH OIL) 1200 MG CAPS Take 1 capsule by mouth 2 (two) times daily.       . pravastatin (PRAVACHOL) 80 MG tablet Take 80 mg by mouth at bedtime.      Marland Kitchen PRESCRIPTION MEDICATION Inject as directed once a week. Allergy Shot that's administered by a nurse weekly.       Marland Kitchen pyridOXINE (VITAMIN B-6) 100 MG tablet Take 100 mg by mouth at bedtime.      . sennosides-docusate sodium (SENOKOT-S) 8.6-50 MG tablet Take  2 tablets by mouth at bedtime.      . vitamin B-12 (CYANOCOBALAMIN) 1000 MCG tablet Take 1,000 mcg by mouth every morning.      . vitamin E 400 UNIT capsule Take 400 Units by mouth at bedtime.         Allergies as of 10/30/2012 - Review Complete 10/30/2012  Allergen Reaction Noted  . Celecoxib Shortness Of Breath   . Amoxicillin-pot clavulanate      Family History  Problem Relation Age of Onset  . Heart disease Mother   . Gallbladder disease Mother   . Breast cancer Sister   . Colon cancer Neg Hx     History   Social History  . Marital Status: Widowed    Spouse Name: N/A    Number of Children: 4  . Years of Education: N/A   Occupational History  . 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 Retired     Engineer, civil (consulting)   Social History Main Topics  .  Smoking status: Former Smoker -- 0.8 packs/day for 20 years    Types: Cigarettes    Quit date: 12/31/1978  . Smokeless tobacco: Never Used  . Alcohol Use: No  . Drug Use: No  . Sexually Active: None   Other Topics Concern  . None   Social History Narrative  . None    Review of Systems: Gen: Denies fever, chills, anorexia. Denies fatigue, weakness, weight loss.  CV: Denies chest pain, palpitations, syncope, peripheral edema, and claudication. Resp:+ ASTHMA GI: SEE HPI Derm: +dry skin Psych: Denies depression, anxiety, memory loss, confusion. No homicidal or suicidal ideation.  Heme: Denies bruising, bleeding, and enlarged lymph nodes.  Physical Exam: BP 129/77  Pulse 78  Temp 97 F (36.1 C) (Temporal)  Ht 5\' 1"  (1.549 m)  Wt 136 lb 6.4 oz (61.871 kg)  BMI 25.77 kg/m2 General:   Alert and oriented. No distress noted. Pleasant and cooperative.  Head:  Normocephalic and atraumatic. Eyes:  Conjuctiva clear without scleral icterus. Mouth:  Oral mucosa pink and moist.  Heart:  S1, S2 present without murmurs, rubs, or gallops. Regular rate and rhythm. Abdomen:  +BS, soft, non-tender and non-distended. No rebound or guarding. No HSM or masses noted. Msk:  Symmetrical without gross deformities. Slight kyphosis Extremities:  Without edema. Neurologic:  Alert and  oriented x4;  grossly normal neurologically. Skin:  Intact without significant lesions or rashes. Cervical Nodes:  No significant cervical adenopathy. Psych:  Alert and cooperative. Normal mood and affect.

## 2012-11-05 DIAGNOSIS — J309 Allergic rhinitis, unspecified: Secondary | ICD-10-CM | POA: Diagnosis not present

## 2012-11-13 NOTE — Progress Notes (Signed)
REVIEWED.  E30 SLF 2015

## 2012-11-21 DIAGNOSIS — J309 Allergic rhinitis, unspecified: Secondary | ICD-10-CM | POA: Diagnosis not present

## 2012-11-25 NOTE — Progress Notes (Signed)
Reminder in epic to follow up in 2 years with SF in E30

## 2012-12-03 DIAGNOSIS — T7840XA Allergy, unspecified, initial encounter: Secondary | ICD-10-CM | POA: Diagnosis not present

## 2012-12-04 DIAGNOSIS — J309 Allergic rhinitis, unspecified: Secondary | ICD-10-CM | POA: Diagnosis not present

## 2012-12-22 DIAGNOSIS — J309 Allergic rhinitis, unspecified: Secondary | ICD-10-CM | POA: Diagnosis not present

## 2013-01-05 DIAGNOSIS — J309 Allergic rhinitis, unspecified: Secondary | ICD-10-CM | POA: Diagnosis not present

## 2013-01-20 DIAGNOSIS — J309 Allergic rhinitis, unspecified: Secondary | ICD-10-CM | POA: Diagnosis not present

## 2013-02-03 DIAGNOSIS — J309 Allergic rhinitis, unspecified: Secondary | ICD-10-CM | POA: Diagnosis not present

## 2013-02-06 ENCOUNTER — Other Ambulatory Visit (HOSPITAL_COMMUNITY): Payer: Self-pay | Admitting: Allergy and Immunology

## 2013-02-06 ENCOUNTER — Ambulatory Visit (HOSPITAL_COMMUNITY)
Admission: RE | Admit: 2013-02-06 | Discharge: 2013-02-06 | Disposition: A | Discharge status: Home / Self Care | Payer: Medicare Other | Source: Ambulatory Visit | Attending: Allergy and Immunology | Admitting: Allergy and Immunology

## 2013-02-06 DIAGNOSIS — R05 Cough: Secondary | ICD-10-CM

## 2013-02-06 DIAGNOSIS — I10 Essential (primary) hypertension: Secondary | ICD-10-CM | POA: Insufficient documentation

## 2013-02-06 DIAGNOSIS — R059 Cough, unspecified: Secondary | ICD-10-CM | POA: Diagnosis not present

## 2013-02-06 DIAGNOSIS — J3089 Other allergic rhinitis: Secondary | ICD-10-CM | POA: Diagnosis not present

## 2013-02-06 DIAGNOSIS — J45909 Unspecified asthma, uncomplicated: Secondary | ICD-10-CM | POA: Diagnosis not present

## 2013-02-06 DIAGNOSIS — J438 Other emphysema: Secondary | ICD-10-CM | POA: Diagnosis not present

## 2013-02-06 DIAGNOSIS — K219 Gastro-esophageal reflux disease without esophagitis: Secondary | ICD-10-CM | POA: Diagnosis not present

## 2013-02-09 DIAGNOSIS — J309 Allergic rhinitis, unspecified: Secondary | ICD-10-CM | POA: Diagnosis not present

## 2013-02-16 DIAGNOSIS — J309 Allergic rhinitis, unspecified: Secondary | ICD-10-CM | POA: Diagnosis not present

## 2013-02-20 DIAGNOSIS — J309 Allergic rhinitis, unspecified: Secondary | ICD-10-CM | POA: Diagnosis not present

## 2013-02-23 DIAGNOSIS — J45909 Unspecified asthma, uncomplicated: Secondary | ICD-10-CM | POA: Diagnosis not present

## 2013-02-23 DIAGNOSIS — Z6825 Body mass index (BMI) 25.0-25.9, adult: Secondary | ICD-10-CM | POA: Diagnosis not present

## 2013-02-23 DIAGNOSIS — E039 Hypothyroidism, unspecified: Secondary | ICD-10-CM | POA: Diagnosis not present

## 2013-02-23 DIAGNOSIS — J309 Allergic rhinitis, unspecified: Secondary | ICD-10-CM | POA: Diagnosis not present

## 2013-02-26 DIAGNOSIS — T7840XA Allergy, unspecified, initial encounter: Secondary | ICD-10-CM | POA: Diagnosis not present

## 2013-02-26 DIAGNOSIS — E890 Postprocedural hypothyroidism: Secondary | ICD-10-CM | POA: Diagnosis not present

## 2013-03-05 ENCOUNTER — Ambulatory Visit (INDEPENDENT_AMBULATORY_CARE_PROVIDER_SITE_OTHER): Payer: Medicare Other | Admitting: Otolaryngology

## 2013-03-05 DIAGNOSIS — R05 Cough: Secondary | ICD-10-CM | POA: Diagnosis not present

## 2013-03-05 DIAGNOSIS — R490 Dysphonia: Secondary | ICD-10-CM

## 2013-03-05 DIAGNOSIS — R059 Cough, unspecified: Secondary | ICD-10-CM

## 2013-03-11 DIAGNOSIS — J309 Allergic rhinitis, unspecified: Secondary | ICD-10-CM | POA: Diagnosis not present

## 2013-03-17 ENCOUNTER — Telehealth: Payer: Self-pay

## 2013-03-17 NOTE — Telephone Encounter (Signed)
Pt left VM that she needs refills on Dexilant sent to [facility].

## 2013-03-19 DIAGNOSIS — J309 Allergic rhinitis, unspecified: Secondary | ICD-10-CM | POA: Diagnosis not present

## 2013-03-19 MED ORDER — DEXLANSOPRAZOLE 60 MG PO CPDR: 60.0000 mg | DELAYED_RELEASE_CAPSULE | Freq: Every day | ORAL | Status: DC

## 2013-03-24 ENCOUNTER — Telehealth: Payer: Self-pay

## 2013-03-24 NOTE — Telephone Encounter (Signed)
REVIEWED. AGREE. 

## 2013-03-24 NOTE — Telephone Encounter (Signed)
Vm from [facility] in reference to the Dexilant prescription  Ref # [id number]. I returned the call to [phone] and spoke to [employer] She wanted to change the prescription to 90 day supply with 3 refills because of it being a mail order. I told her OK since Lorenza Burton, NP had given 11 refills for monthly.

## 2013-03-26 DIAGNOSIS — J309 Allergic rhinitis, unspecified: Secondary | ICD-10-CM | POA: Diagnosis not present

## 2013-03-27 ENCOUNTER — Other Ambulatory Visit: Payer: Self-pay | Admitting: Cardiology

## 2013-04-03 DIAGNOSIS — J309 Allergic rhinitis, unspecified: Secondary | ICD-10-CM | POA: Diagnosis not present

## 2013-04-13 DIAGNOSIS — J309 Allergic rhinitis, unspecified: Secondary | ICD-10-CM | POA: Diagnosis not present

## 2013-04-16 DIAGNOSIS — E039 Hypothyroidism, unspecified: Secondary | ICD-10-CM | POA: Diagnosis not present

## 2013-04-22 DIAGNOSIS — T7840XA Allergy, unspecified, initial encounter: Secondary | ICD-10-CM | POA: Diagnosis not present

## 2013-04-23 DIAGNOSIS — E890 Postprocedural hypothyroidism: Secondary | ICD-10-CM | POA: Diagnosis not present

## 2013-04-28 ENCOUNTER — Encounter: Payer: Self-pay | Admitting: Cardiology

## 2013-04-28 ENCOUNTER — Ambulatory Visit (INDEPENDENT_AMBULATORY_CARE_PROVIDER_SITE_OTHER): Payer: Medicare Other | Admitting: Cardiology

## 2013-04-28 VITALS — BP 122/67 | HR 77 | Ht 61.0 in | Wt 135.0 lb

## 2013-04-28 DIAGNOSIS — I10 Essential (primary) hypertension: Secondary | ICD-10-CM

## 2013-04-28 DIAGNOSIS — D649 Anemia, unspecified: Secondary | ICD-10-CM | POA: Diagnosis not present

## 2013-04-28 DIAGNOSIS — E785 Hyperlipidemia, unspecified: Secondary | ICD-10-CM

## 2013-04-28 NOTE — Progress Notes (Signed)
Name: Christine Leblanc    DOB: 1929/02/23  Age: 79 y.o.  MR#: [id number]       PCP:  Kirk Ruths, MD      Insurance: Payor: MEDICARE  Plan: MEDICARE PART A AND B  Product Type: *No Product type*    CC:   No chief complaint on file.  LIST VS Filed Vitals:   04/28/13 1257  BP: 122/67  Pulse: 77  Height: 5\' 1"  (1.549 m)  Weight: 135 lb (61.236 kg)    Weights Current Weight  04/28/13 135 lb (61.236 kg)  10/30/12 136 lb 6.4 oz (61.871 kg)  08/02/12 137 lb 9.1 oz (62.4 kg)    Blood Pressure  BP Readings from Last 3 Encounters:  04/28/13 122/67  10/30/12 129/77  08/03/12 154/83     Admit date:  (Not on file) Last encounter with [facility]:  03/27/2013   Allergy Celecoxib and Amoxicillin-pot clavulanate  Current Outpatient Prescriptions  Medication Sig Dispense Refill  . albuterol (PROVENTIL HFA) 108 (90 BASE) MCG/ACT inhaler Inhale 2 puffs into the lungs every 6 (six) hours as needed. Wheezing, Asthma Symptoms      . amLODipine (NORVASC) 5 MG tablet Take 5 mg by mouth every morning.       Marland Kitchen aspirin EC 81 MG tablet Take 81 mg by mouth daily.      . calcium-vitamin D (OSCAL WITH D) 500-200 MG-UNIT per tablet Take 1 tablet by mouth 2 (two) times daily.       Marland Kitchen dexlansoprazole (DEXILANT) 60 MG capsule Take 1 capsule (60 mg total) by mouth daily.  31 capsule  11  . diazepam (VALIUM) 5 MG tablet Take 2.5 mg by mouth daily as needed. Sleep      . etodolac (LODINE) 300 MG capsule Take 300 mg by mouth 2 (two) times daily.      . Glucosamine-Chondroit-Vit C-Mn (GLUCOSAMINE CHONDR 1500 COMPLX PO) Take 1 tablet by mouth 2 (two) times daily. INCLUDES: Chondroitin 1200mg  (1.2 g)      . levocetirizine (XYZAL) 5 MG tablet Take 5 mg by mouth every evening.        Marland Kitchen levothyroxine (SYNTHROID, LEVOTHROID) 112 MCG tablet Take 112 mcg by mouth daily before breakfast.      . Omega-3 Fatty Acids (FISH OIL) 1200 MG CAPS Take 1 capsule by mouth 2 (two) times daily.       . pravastatin (PRAVACHOL) 80 MG  tablet TAKE 1 TABLET BY MOUTH DAILY  90 tablet  0  . PRESCRIPTION MEDICATION Inject as directed once a week. Allergy Shot that's administered by a nurse weekly.       Marland Kitchen pyridOXINE (VITAMIN B-6) 100 MG tablet Take 100 mg by mouth at bedtime.      Bernadette Hoit Sodium (PERI-COLACE PO) Take by mouth. Take  daily      . vitamin B-12 (CYANOCOBALAMIN) 1000 MCG tablet Take 1,000 mcg by mouth every morning.      . vitamin E 400 UNIT capsule Take 400 Units by mouth at bedtime.        No current facility-administered medications for this visit.    Discontinued Meds:    Medications Discontinued During This Encounter  Medication Reason  . aspirin 325 MG tablet Error  . levothyroxine (SYNTHROID, LEVOTHROID) 88 MCG tablet Error  . sennosides-docusate sodium (SENOKOT-S) 8.6-50 MG tablet Error    Patient Active Problem List   Diagnosis Date Noted  . Dehydration 08/02/2012  . Syncope and collapse   . Anemia 04/26/2012  .  Gastroesophageal reflux disease   . Lymphocytic thyroiditis   . Hypertension 12/07/2011  . History of TIAs 12/07/2011  . Leg pain, bilateral 04/27/2011  . Hyperlipidemia 04/27/2011  . ASTHMA 05/08/2010    LABS    Component Value Date/Time   NA 135 08/03/2012 0236   NA 138 08/02/2012 1530   NA 143 11/20/2011 1330   K 3.7 08/03/2012 0236   K 4.0 08/02/2012 1530   K 3.7 11/20/2011 1330   CL 103 08/03/2012 0236   CL 104 08/02/2012 1530   CL 108 11/20/2011 1330   CO2 27 08/03/2012 0236   CO2 27 08/02/2012 1530   CO2 27 11/20/2011 1330   GLUCOSE 105* 08/03/2012 0236   GLUCOSE 109* 08/02/2012 1530   GLUCOSE 95 11/20/2011 1330   BUN 20 08/03/2012 0236   BUN 26* 08/02/2012 1530   BUN 20 11/20/2011 1330   CREATININE 0.62 08/03/2012 0236   CREATININE 0.74 08/02/2012 1530   CREATININE 0.61 11/20/2011 1330   CREATININE 0.76 05/01/2011 0945   CALCIUM 8.9 08/03/2012 0236   CALCIUM 9.4 08/02/2012 1530   CALCIUM 9.2 11/20/2011 1330   GFRNONAA 82* 08/03/2012 0236   GFRNONAA 78* 08/02/2012 1530    GFRNONAA 83* 11/20/2011 1330   GFRAA >90 08/03/2012 0236   GFRAA >90 08/02/2012 1530   GFRAA >90 11/20/2011 1330   CMP     Component Value Date/Time   NA 135 08/03/2012 0236   K 3.7 08/03/2012 0236   CL 103 08/03/2012 0236   CO2 27 08/03/2012 0236   GLUCOSE 105* 08/03/2012 0236   BUN 20 08/03/2012 0236   CREATININE 0.62 08/03/2012 0236   CREATININE 0.76 05/01/2011 0945   CALCIUM 8.9 08/03/2012 0236   PROT 6.6 06/25/2011 0935   ALBUMIN 4.1 06/25/2011 0935   AST 26 06/25/2011 0935   ALT 20 06/25/2011 0935   ALKPHOS 53 06/25/2011 0935   BILITOT 0.5 06/25/2011 0935   GFRNONAA 82* 08/03/2012 0236   GFRAA >90 08/03/2012 0236       Component Value Date/Time   WBC 9.9 08/03/2012 0236   WBC 14.0* 08/02/2012 1530   WBC 5.2 11/20/2011 1330   HGB 11.5* 08/03/2012 0236   HGB 11.9* 08/02/2012 1530   HGB 11.0* 11/20/2011 1330   HCT 33.6* 08/03/2012 0236   HCT 34.9* 08/02/2012 1530   HCT 32.8* 11/20/2011 1330   MCV 90.3 08/03/2012 0236   MCV 90.9 08/02/2012 1530   MCV 88.4 11/20/2011 1330    Lipid Panel     Component Value Date/Time   CHOL 196 06/25/2011 0935   TRIG 88 06/25/2011 0935   HDL 70 06/25/2011 0935   CHOLHDL 2.8 06/25/2011 0935   VLDL 18 06/25/2011 0935   LDLCALC 108* 06/25/2011 0935    ABG No results found for this basename: phart, pco2, pco2art, po2, po2art, hco3, tco2, acidbasedef, o2sat     Lab Results  Component Value Date   TSH 1.623 08/02/2012   BNP (last 3 results) No results found for this basename: PROBNP,  in the last 8760 hours Cardiac Panel (last 3 results) No results found for this basename: CKTOTAL, CKMB, TROPONINI, RELINDX,  in the last 72 hours  Iron/TIBC/Ferritin    Component Value Date/Time   IRON 82 02/13/2010   TIBC 272 02/13/2010     EKG Orders placed during the hospital encounter of 08/02/12  . ED EKG  . ED EKG  . EKG     Prior Assessment and Plan Problem List as of 04/28/2013  ICD-9-CM   Hypertension   Last Assessment & Plan   04/28/2012 Office Visit Written 04/28/2012   2:22 PM by Kathlen Brunswick, MD     Blood pressure control is adequate.  Patient will monitor home values over the next month or 2 and return a list of readings to [place].    ASTHMA   Leg pain, bilateral   Last Assessment & Plan   04/27/2011 Office Visit Edited 04/27/2011  1:22 PM by Jodelle Gross, NP     On review of her medications I find that she is taking benadryl to help with sleep.  I have advised her to stop taking this as it has been found to cause restless legs and cramping in some people.  With respect to varicosities, I have advised her to wear support hose to assist with some leg discomfort as a result of the varicose veins.  I have advised her to see Dr. Oletta Cohn for evaluation of need to X-ray lumbar/sacral spine for abnormalities contributing to her symptoms.    On assessment she had great pulses, and no edema.  She verbalized understanding. I will check BMET and Mg level.    Hyperlipidemia   Last Assessment & Plan   04/28/2012 Office Visit Edited 05/01/2012 11:12 PM by Kathlen Brunswick, MD     Lipid profile was suboptimal when assessed one year ago.  Dose of pravastatin will be increased to 80 mg per day if repeat lipid determination is similar.    History of TIAs   Last Assessment & Plan   12/07/2011 Office Visit Written 12/07/2011  4:39 PM by Jodelle Gross, NP     She has expressed transient left leg weakness, but also has a history of leg pain and cramping in the past. I do not think that she would be a good candidate for plavix therapy in this setting as she is very skitish about her bleeding tendencies.  I agree with carotid study at this time although doubt that there is significant stenosis. . CT scan did not show acute infarct. Atrophy and chronic microvascular ischemic changes were noted.     Gastroesophageal reflux disease   Last Assessment & Plan   10/30/2012 Office Visit Written 10/30/2012  2:58 PM by Nira Retort, NP     79 year old female with hx of GERD, doing  well on Dexilant daily. Wt is overall stable and ranging from the low 130s to max of 140. States this is her baseline. Hx of distal esophageal web requiring dilation; however, she is at baseline. Only notes difficulty with large pills but no other issues. Continues to refuse colonoscopy despite our recommendations. At this time, appropriate to return in 2 years barring any issues in the interim.     Lymphocytic thyroiditis   Anemia   Last Assessment & Plan   04/28/2012 Office Visit Written 04/28/2012  2:23 PM by Kathlen Brunswick, MD     Modest anemia in the past without a specific etiology identified.  A repeat CBC will be obtained.    Syncope and collapse   Dehydration       Imaging: No results found.

## 2013-04-28 NOTE — Assessment & Plan Note (Addendum)
Mild anemia persisted as of 10 months ago.  Further evaluation by Hematology may be of value.

## 2013-04-28 NOTE — Assessment & Plan Note (Signed)
Lipid profile was good 11 months ago; current therapy appears efficacious. We will seek more recent lab results and office records from patient's various physicians.

## 2013-04-28 NOTE — Assessment & Plan Note (Addendum)
Adequate control of hypertension with current medication, which will be continued.  More recent BP determinations will be obtained and reviewed.

## 2013-04-28 NOTE — Patient Instructions (Addendum)
Your physician recommends that you schedule a follow-up appointment in: ONE YEAR 

## 2013-04-28 NOTE — Progress Notes (Signed)
Patient ID: Christine Leblanc, female   DOB: 09/12/1929, 79 y.o.   MRN: [id number]  HPI: Schedule return visit for this delightful woman with no known cardiovascular disease followed for management of hypertension and hyperlipidemia. Both have been well controlled of late. Patient provides a list of 7 home determinations with normal diastolics and a single systolic elevated to 147 mmHg. She has undergone cataract surgery x3, bilateral procedures and then a second procedure on her right eye for a retained portion of the lens. Despite this, her near vision is still problematic, but distance vision is excellent.  Current Outpatient Prescriptions  Medication Sig Dispense Refill  . albuterol (PROVENTIL HFA) 108 (90 BASE) MCG/ACT inhaler Inhale 2 puffs into the lungs every 6 (six) hours as needed. Wheezing, Asthma Symptoms      . amLODipine (NORVASC) 5 MG tablet Take 5 mg by mouth every morning.       Marland Kitchen aspirin EC 81 MG tablet Take 81 mg by mouth daily.      . calcium-vitamin D (OSCAL WITH D) 500-200 MG-UNIT per tablet Take 1 tablet by mouth 2 (two) times daily.       Marland Kitchen dexlansoprazole (DEXILANT) 60 MG capsule Take 1 capsule (60 mg total) by mouth daily.  31 capsule  11  . diazepam (VALIUM) 5 MG tablet Take 2.5 mg by mouth daily as needed. Sleep      . etodolac (LODINE) 300 MG capsule Take 300 mg by mouth 2 (two) times daily.      . Glucosamine-Chondroit-Vit C-Mn (GLUCOSAMINE CHONDR 1500 COMPLX PO) Take 1 tablet by mouth 2 (two) times daily. INCLUDES: Chondroitin 1200mg  (1.2 g)      . levocetirizine (XYZAL) 5 MG tablet Take 5 mg by mouth every evening.        Marland Kitchen levothyroxine (SYNTHROID, LEVOTHROID) 112 MCG tablet Take 112 mcg by mouth daily before breakfast.      . Omega-3 Fatty Acids (FISH OIL) 1200 MG CAPS Take 1 capsule by mouth 2 (two) times daily.       . pravastatin (PRAVACHOL) 80 MG tablet TAKE 1 TABLET BY MOUTH DAILY  90 tablet  0  . PRESCRIPTION MEDICATION Inject as directed once a week. Allergy Shot  that's administered by a nurse weekly.       Marland Kitchen pyridOXINE (VITAMIN B-6) 100 MG tablet Take 100 mg by mouth at bedtime.      Bernadette Hoit Sodium (PERI-COLACE PO) Take by mouth. Take  daily      . vitamin B-12 (CYANOCOBALAMIN) 1000 MCG tablet Take 1,000 mcg by mouth every morning.      . vitamin E 400 UNIT capsule Take 400 Units by mouth at bedtime.        No current facility-administered medications for this visit.   Allergies  Allergen Reactions  . Celecoxib Shortness Of Breath  . Amoxicillin-Pot Clavulanate     AUGMENTIN: Unknown/ Upset stomach *Patient states that she takes Amoxicillin prior to dental procedures*     Past medical history, social history, and family history reviewed and updated.  ROS: Denies chest pain, dyspnea, orthopnea, PND or pedal edema. All other systems reviewed and are negative.  PHYSICAL EXAM: BP 122/67  Pulse 77  Ht 5\' 1"  (1.549 m)  Wt 61.236 kg (135 lb)  BMI 25.52 kg/m2;  Body mass index is 25.52 kg/(m^2). General-Well developed; no acute distress Body habitus-proportionate weight and height Neck-No JVD; no carotid bruits Lungs-clear lung fields; resonant to percussion Cardiovascular-normal PMI; normal S1 and S2;  fourth heart sound present Abdomen-normal bowel sounds; soft and non-tender without masses or organomegaly Musculoskeletal-No deformities, no cyanosis or clubbing Neurologic-Normal cranial nerves; symmetric strength and tone Skin-Warm, no significant lesions Extremities-distal pulses intact; no edema  [place] Bing, MD 04/28/2013  1:57 PM  ASSESSMENT AND PLAN

## 2013-04-30 DIAGNOSIS — J309 Allergic rhinitis, unspecified: Secondary | ICD-10-CM | POA: Diagnosis not present

## 2013-05-06 ENCOUNTER — Encounter: Payer: Self-pay | Admitting: Cardiology

## 2013-05-11 DIAGNOSIS — J309 Allergic rhinitis, unspecified: Secondary | ICD-10-CM | POA: Diagnosis not present

## 2013-05-18 DIAGNOSIS — IMO0002 Reserved for concepts with insufficient information to code with codable children: Secondary | ICD-10-CM | POA: Diagnosis not present

## 2013-05-18 DIAGNOSIS — F411 Generalized anxiety disorder: Secondary | ICD-10-CM | POA: Diagnosis not present

## 2013-05-22 DIAGNOSIS — J309 Allergic rhinitis, unspecified: Secondary | ICD-10-CM | POA: Diagnosis not present

## 2013-06-05 DIAGNOSIS — J309 Allergic rhinitis, unspecified: Secondary | ICD-10-CM | POA: Diagnosis not present

## 2013-06-16 DIAGNOSIS — J309 Allergic rhinitis, unspecified: Secondary | ICD-10-CM | POA: Diagnosis not present

## 2013-06-24 DIAGNOSIS — J309 Allergic rhinitis, unspecified: Secondary | ICD-10-CM | POA: Diagnosis not present

## 2013-07-01 DIAGNOSIS — J309 Allergic rhinitis, unspecified: Secondary | ICD-10-CM | POA: Diagnosis not present

## 2013-07-13 DIAGNOSIS — J309 Allergic rhinitis, unspecified: Secondary | ICD-10-CM | POA: Diagnosis not present

## 2013-07-14 DIAGNOSIS — J309 Allergic rhinitis, unspecified: Secondary | ICD-10-CM | POA: Diagnosis not present

## 2013-07-17 DIAGNOSIS — H21239 Degeneration of iris (pigmentary), unspecified eye: Secondary | ICD-10-CM | POA: Diagnosis not present

## 2013-07-17 DIAGNOSIS — H52209 Unspecified astigmatism, unspecified eye: Secondary | ICD-10-CM | POA: Diagnosis not present

## 2013-07-17 DIAGNOSIS — Z961 Presence of intraocular lens: Secondary | ICD-10-CM | POA: Diagnosis not present

## 2013-07-17 DIAGNOSIS — H2640 Unspecified secondary cataract: Secondary | ICD-10-CM | POA: Diagnosis not present

## 2013-07-28 DIAGNOSIS — Z85828 Personal history of other malignant neoplasm of skin: Secondary | ICD-10-CM | POA: Diagnosis not present

## 2013-07-28 DIAGNOSIS — B079 Viral wart, unspecified: Secondary | ICD-10-CM | POA: Diagnosis not present

## 2013-07-31 DIAGNOSIS — J309 Allergic rhinitis, unspecified: Secondary | ICD-10-CM | POA: Diagnosis not present

## 2013-08-05 DIAGNOSIS — M25519 Pain in unspecified shoulder: Secondary | ICD-10-CM | POA: Diagnosis not present

## 2013-08-05 DIAGNOSIS — M1710 Unilateral primary osteoarthritis, unspecified knee: Secondary | ICD-10-CM | POA: Diagnosis not present

## 2013-08-10 DIAGNOSIS — J309 Allergic rhinitis, unspecified: Secondary | ICD-10-CM | POA: Diagnosis not present

## 2013-08-17 DIAGNOSIS — Z803 Family history of malignant neoplasm of breast: Secondary | ICD-10-CM | POA: Diagnosis not present

## 2013-08-17 DIAGNOSIS — Z1231 Encounter for screening mammogram for malignant neoplasm of breast: Secondary | ICD-10-CM | POA: Diagnosis not present

## 2013-08-22 ENCOUNTER — Other Ambulatory Visit: Payer: Self-pay | Admitting: Cardiology

## 2013-08-24 DIAGNOSIS — J309 Allergic rhinitis, unspecified: Secondary | ICD-10-CM | POA: Diagnosis not present

## 2013-09-02 DIAGNOSIS — J309 Allergic rhinitis, unspecified: Secondary | ICD-10-CM | POA: Diagnosis not present

## 2013-09-04 DIAGNOSIS — J45909 Unspecified asthma, uncomplicated: Secondary | ICD-10-CM | POA: Diagnosis not present

## 2013-09-04 DIAGNOSIS — J3089 Other allergic rhinitis: Secondary | ICD-10-CM | POA: Diagnosis not present

## 2013-09-04 DIAGNOSIS — K219 Gastro-esophageal reflux disease without esophagitis: Secondary | ICD-10-CM | POA: Diagnosis not present

## 2013-09-07 DIAGNOSIS — J309 Allergic rhinitis, unspecified: Secondary | ICD-10-CM | POA: Diagnosis not present

## 2013-09-14 DIAGNOSIS — H9120 Sudden idiopathic hearing loss, unspecified ear: Secondary | ICD-10-CM | POA: Diagnosis not present

## 2013-09-14 DIAGNOSIS — H905 Unspecified sensorineural hearing loss: Secondary | ICD-10-CM | POA: Diagnosis not present

## 2013-09-14 DIAGNOSIS — H6120 Impacted cerumen, unspecified ear: Secondary | ICD-10-CM | POA: Diagnosis not present

## 2013-09-15 DIAGNOSIS — J309 Allergic rhinitis, unspecified: Secondary | ICD-10-CM | POA: Diagnosis not present

## 2013-09-21 DIAGNOSIS — H9120 Sudden idiopathic hearing loss, unspecified ear: Secondary | ICD-10-CM | POA: Diagnosis not present

## 2013-09-21 DIAGNOSIS — H905 Unspecified sensorineural hearing loss: Secondary | ICD-10-CM | POA: Diagnosis not present

## 2013-09-22 DIAGNOSIS — J309 Allergic rhinitis, unspecified: Secondary | ICD-10-CM | POA: Diagnosis not present

## 2013-09-25 DIAGNOSIS — J309 Allergic rhinitis, unspecified: Secondary | ICD-10-CM | POA: Diagnosis not present

## 2013-10-01 ENCOUNTER — Ambulatory Visit (INDEPENDENT_AMBULATORY_CARE_PROVIDER_SITE_OTHER): Payer: Medicare Other | Admitting: Otolaryngology

## 2013-10-01 DIAGNOSIS — H9120 Sudden idiopathic hearing loss, unspecified ear: Secondary | ICD-10-CM | POA: Diagnosis not present

## 2013-10-01 DIAGNOSIS — H903 Sensorineural hearing loss, bilateral: Secondary | ICD-10-CM | POA: Diagnosis not present

## 2013-10-05 DIAGNOSIS — J309 Allergic rhinitis, unspecified: Secondary | ICD-10-CM | POA: Diagnosis not present

## 2013-10-12 ENCOUNTER — Telehealth: Payer: Self-pay | Admitting: Endocrinology

## 2013-10-12 ENCOUNTER — Other Ambulatory Visit: Payer: Self-pay | Admitting: *Deleted

## 2013-10-12 DIAGNOSIS — E039 Hypothyroidism, unspecified: Secondary | ICD-10-CM | POA: Insufficient documentation

## 2013-10-12 NOTE — Telephone Encounter (Signed)
Tsh ft4

## 2013-10-12 NOTE — Telephone Encounter (Signed)
Pt wants lab orders faxed to [facility] before her 10/23 appt, what labs do you want done?

## 2013-10-15 ENCOUNTER — Telehealth: Payer: Self-pay | Admitting: Endocrinology

## 2013-10-15 ENCOUNTER — Other Ambulatory Visit: Payer: Self-pay | Admitting: *Deleted

## 2013-10-15 DIAGNOSIS — J309 Allergic rhinitis, unspecified: Secondary | ICD-10-CM | POA: Diagnosis not present

## 2013-10-15 NOTE — Telephone Encounter (Signed)
Please call pt. Re:has lab orders been faxed to [facility] / [facility]

## 2013-10-15 NOTE — Telephone Encounter (Signed)
Labs faxed to [facility] 

## 2013-10-16 ENCOUNTER — Other Ambulatory Visit: Payer: Self-pay | Admitting: Endocrinology

## 2013-10-16 DIAGNOSIS — E039 Hypothyroidism, unspecified: Secondary | ICD-10-CM | POA: Diagnosis not present

## 2013-10-16 NOTE — Telephone Encounter (Signed)
Labs faxed to [facility] per patients request.

## 2013-10-22 ENCOUNTER — Ambulatory Visit (INDEPENDENT_AMBULATORY_CARE_PROVIDER_SITE_OTHER): Payer: Medicare Other | Admitting: Endocrinology

## 2013-10-22 ENCOUNTER — Encounter: Payer: Self-pay | Admitting: Endocrinology

## 2013-10-22 VITALS — BP 132/70 | HR 84 | Temp 98.7°F | Resp 12 | Ht 63.0 in | Wt 130.8 lb

## 2013-10-22 DIAGNOSIS — E039 Hypothyroidism, unspecified: Secondary | ICD-10-CM

## 2013-10-22 DIAGNOSIS — R29890 Loss of height: Secondary | ICD-10-CM

## 2013-10-22 DIAGNOSIS — Z13820 Encounter for screening for osteoporosis: Secondary | ICD-10-CM | POA: Diagnosis not present

## 2013-10-22 NOTE — Progress Notes (Signed)
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  she has been on 112 mcg since 01/2013       The response to therapy has been  decreased fatigue         Compliance with the medical regimen has been as prescribed with taking the tablet in the morning before breakfast.   Orders Only on 10/16/2013  Component Date Value Range Status  . TSH 10/16/2013 0.848  0.350 - 4.500 uIU/mL Final  . Free T4 10/16/2013 1.46  0.80 - 1.80 ng/dL Final     PROBLEM #2: She is concerned about her height loss and the fact that her rib cage on the sides is touching her pelvic bone. She appears to have lost at least 3 inches in height She thinks she had a bone density a few years ago and was reportedly normal. No history of fractures and no record of bone density available in the chart. Her lumbar spine x-ray about 2 years ago showed only decreased disc spaces     Medication List       This list is accurate as of: 10/22/13  1:28 PM.  Always use your most recent med list.               amLODipine 5 MG tablet  Commonly known as:  NORVASC  Take 5 mg by mouth every morning.     aspirin EC 81 MG tablet  Take 81 mg by mouth daily.     calcium-vitamin D 500-200 MG-UNIT per tablet  Commonly known as:  OSCAL WITH D  Take 1 tablet by mouth 2 (two) times daily.     dexlansoprazole 60 MG capsule  Commonly known as:  DEXILANT  Take 1 capsule (60 mg total) by mouth daily.     diazepam 5 MG tablet  Commonly known as:  VALIUM  Take 2.5 mg by mouth daily as needed. Sleep     etodolac 300 MG capsule  Commonly known as:   LODINE  Take 300 mg by mouth 2 (two) times daily.     Fish Oil 1200 MG Caps  Take 1 capsule by mouth 2 (two) times daily.     GLUCOSAMINE CHONDR 1500 COMPLX PO  Take 1 tablet by mouth 2 (two) times daily. INCLUDES: Chondroitin 1200mg  (1.2 g)     levocetirizine 5 MG tablet  Commonly known as:  XYZAL  Take 5 mg by mouth every evening.     levothyroxine 112 MCG tablet  Commonly known as:  SYNTHROID, LEVOTHROID  Take 112 mcg by mouth daily before breakfast.     PERI-COLACE PO  Take by mouth. Take  daily     pravastatin 80 MG tablet  Commonly known as:  PRAVACHOL  Take 1 tablet (80 mg total) by mouth daily.     PRESCRIPTION MEDICATION  Inject as directed once a week. Allergy Shot that's administered by a nurse weekly.     PROVENTIL HFA 108 (90 BASE) MCG/ACT inhaler  Generic drug:  albuterol  Inhale 2 puffs into the lungs every 6 (six) hours  as needed. Wheezing, Asthma Symptoms     pyridOXINE 100 MG tablet  Commonly known as:  VITAMIN B-6  Take 100 mg by mouth at bedtime.     vitamin B-12 1000 MCG tablet  Commonly known as:  CYANOCOBALAMIN  Take 1,000 mcg by mouth every morning.     vitamin E 400 UNIT capsule  Take 400 Units by mouth at bedtime.        Allergies:  Allergies  Allergen Reactions  . Celecoxib Shortness Of Breath  . Amoxicillin-Pot Clavulanate     AUGMENTIN: Unknown/ Upset stomach *Patient states that she takes Amoxicillin prior to dental procedures*    Past Medical History  Diagnosis Date  . Syncope and collapse 2007    Possible CVA in 2012 with left lower extremity weakness; refused hospitalization; CT-Atrophy and chronic microvascular ischemic change.   . Hypertension   . Asthma   . Degenerative joint disease     Left shoulder; cervical spine  . Gastroesophageal reflux disease     Hiatal hernia; distal esophageal web requiring dilatation; gastric polyps; gastritis; refuses colonoscopy  . Lymphocytic thyroiditis   . Multiple thyroid nodules  2010    Adenomatous; thyroidectomy in 2010  . Hyperlipidemia 04/27/2011  . Anemia   . GERD (gastroesophageal reflux disease)   . Chronic neck pain   . Chronic back pain     Past Surgical History  Procedure Laterality Date  . Abdominal hysterectomy      fibroids  . Prolapsed uterine fibroid ligation    . Total thyroidectomy  2010  . Esophagogastroduodenoscopy  12/10    small hiatal ernia/gastric polyps/mild gastritis  . Cataract extraction      Bilateral; redo surgery on the right for incomplete primary procedure  . Colonoscopy  Remote  . Shoulder surgery      left    Family History  Problem Relation Age of Onset  . Heart disease Mother   . Gallbladder disease Mother   . Breast cancer Sister   . Colon cancer Neg Hx     Social History:  reports that she quit smoking about 34 years ago. Her smoking use included Cigarettes. She has a 16 pack-year smoking history. She has never used smokeless tobacco. She reports that she does not drink alcohol or use illicit drugs.  REVIEW Of SYSTEMS:  She has a history of anxiety  History of hypertension followed by PCP   Examination:   BP 132/70  Pulse 84  Temp(Src) 98.7 F (37.1 C)  Resp 12  Ht 5\' 3"  (1.6 m)  Wt 130 lb 12.8 oz (59.33 kg)  BMI 23.18 kg/m2  SpO2 93%   GENERAL APPEARANCE: Alert And well-looking.   No puffiness of face or periorbital edema.         NECK: no mass in the thyroid area     Spine: No kyphosis. She does have only a little space between the lateral rib cage and pelvic crest    NEUROLOGIC EXAM: DTRs 2+ bilaterally at biceps.    Assessments   Hypothyroidism with adequate supplementation on 112 mcg, to continue same dosage  ? Osteoporosis. She will get a repeat bone density and be treated accordingly  Huriel Matt 10/22/2013, 1:28 PM

## 2013-10-22 NOTE — Patient Instructions (Signed)
Same dose 

## 2013-10-23 DIAGNOSIS — J309 Allergic rhinitis, unspecified: Secondary | ICD-10-CM | POA: Diagnosis not present

## 2013-10-27 ENCOUNTER — Telehealth: Payer: Self-pay | Admitting: *Deleted

## 2013-10-27 NOTE — Telephone Encounter (Signed)
Pt called about her Bone Density test to be done at [facility]. Please call pt when you get it set up.

## 2013-10-30 ENCOUNTER — Ambulatory Visit (HOSPITAL_COMMUNITY)
Admission: RE | Admit: 2013-10-30 | Discharge: 2013-10-30 | Disposition: A | Discharge status: Home / Self Care | Payer: Medicare Other | Source: Ambulatory Visit | Attending: Endocrinology | Admitting: Endocrinology

## 2013-10-30 ENCOUNTER — Other Ambulatory Visit (HOSPITAL_COMMUNITY): Payer: Medicare Other

## 2013-10-30 DIAGNOSIS — Z13820 Encounter for screening for osteoporosis: Secondary | ICD-10-CM | POA: Insufficient documentation

## 2013-10-30 DIAGNOSIS — Z780 Asymptomatic menopausal state: Secondary | ICD-10-CM | POA: Diagnosis not present

## 2013-10-31 NOTE — Progress Notes (Signed)
Quick Note:  Please let patient know that the bone density result is normal and no further action needed ______ 

## 2013-11-04 DIAGNOSIS — J309 Allergic rhinitis, unspecified: Secondary | ICD-10-CM | POA: Diagnosis not present

## 2013-11-13 DIAGNOSIS — J309 Allergic rhinitis, unspecified: Secondary | ICD-10-CM | POA: Diagnosis not present

## 2013-11-23 DIAGNOSIS — J309 Allergic rhinitis, unspecified: Secondary | ICD-10-CM | POA: Diagnosis not present

## 2013-12-03 ENCOUNTER — Encounter: Payer: Self-pay | Admitting: Endocrinology

## 2013-12-07 DIAGNOSIS — J309 Allergic rhinitis, unspecified: Secondary | ICD-10-CM | POA: Diagnosis not present

## 2013-12-14 DIAGNOSIS — J309 Allergic rhinitis, unspecified: Secondary | ICD-10-CM | POA: Diagnosis not present

## 2014-01-01 DIAGNOSIS — J019 Acute sinusitis, unspecified: Secondary | ICD-10-CM | POA: Diagnosis not present

## 2014-01-01 DIAGNOSIS — J3089 Other allergic rhinitis: Secondary | ICD-10-CM | POA: Diagnosis not present

## 2014-01-01 DIAGNOSIS — J45909 Unspecified asthma, uncomplicated: Secondary | ICD-10-CM | POA: Diagnosis not present

## 2014-01-01 DIAGNOSIS — K219 Gastro-esophageal reflux disease without esophagitis: Secondary | ICD-10-CM | POA: Diagnosis not present

## 2014-01-08 DIAGNOSIS — J309 Allergic rhinitis, unspecified: Secondary | ICD-10-CM | POA: Diagnosis not present

## 2014-01-20 DIAGNOSIS — J309 Allergic rhinitis, unspecified: Secondary | ICD-10-CM | POA: Diagnosis not present

## 2014-01-24 ENCOUNTER — Other Ambulatory Visit: Payer: Self-pay | Admitting: Urgent Care

## 2014-01-29 DIAGNOSIS — J309 Allergic rhinitis, unspecified: Secondary | ICD-10-CM | POA: Diagnosis not present

## 2014-02-08 DIAGNOSIS — J309 Allergic rhinitis, unspecified: Secondary | ICD-10-CM | POA: Diagnosis not present

## 2014-02-19 ENCOUNTER — Other Ambulatory Visit: Payer: Self-pay | Admitting: Endocrinology

## 2014-02-24 DIAGNOSIS — J309 Allergic rhinitis, unspecified: Secondary | ICD-10-CM | POA: Diagnosis not present

## 2014-03-01 ENCOUNTER — Other Ambulatory Visit: Payer: Self-pay | Admitting: Cardiology

## 2014-03-09 DIAGNOSIS — J309 Allergic rhinitis, unspecified: Secondary | ICD-10-CM | POA: Diagnosis not present

## 2014-03-24 DIAGNOSIS — J309 Allergic rhinitis, unspecified: Secondary | ICD-10-CM | POA: Diagnosis not present

## 2014-04-08 ENCOUNTER — Ambulatory Visit (INDEPENDENT_AMBULATORY_CARE_PROVIDER_SITE_OTHER): Payer: Medicare Other | Admitting: Otolaryngology

## 2014-04-08 DIAGNOSIS — J309 Allergic rhinitis, unspecified: Secondary | ICD-10-CM | POA: Diagnosis not present

## 2014-04-13 ENCOUNTER — Telehealth: Payer: Self-pay | Admitting: *Deleted

## 2014-04-14 ENCOUNTER — Other Ambulatory Visit: Payer: Self-pay | Admitting: Endocrinology

## 2014-04-14 DIAGNOSIS — E039 Hypothyroidism, unspecified: Secondary | ICD-10-CM | POA: Diagnosis not present

## 2014-04-14 NOTE — Telephone Encounter (Signed)
Orders faxed

## 2014-04-15 LAB — T4, FREE: FREE T4: 1.76 ng/dL (ref 0.80–1.80)

## 2014-04-15 LAB — TSH: TSH: 0.262 u[IU]/mL — AB (ref 0.350–4.500)

## 2014-04-21 DIAGNOSIS — J309 Allergic rhinitis, unspecified: Secondary | ICD-10-CM | POA: Diagnosis not present

## 2014-04-22 ENCOUNTER — Ambulatory Visit (INDEPENDENT_AMBULATORY_CARE_PROVIDER_SITE_OTHER): Payer: Medicare Other | Admitting: Endocrinology

## 2014-04-22 VITALS — BP 118/60 | HR 82 | Temp 98.1°F | Resp 16 | Ht 62.0 in | Wt 128.2 lb

## 2014-04-22 DIAGNOSIS — E890 Postprocedural hypothyroidism: Secondary | ICD-10-CM

## 2014-04-22 MED ORDER — LEVOTHYROXINE SODIUM 100 MCG PO TABS: 100.0000 ug | ORAL_TABLET | Freq: Every day | ORAL | Status: DC

## 2014-04-22 NOTE — Progress Notes (Signed)
Christine Leblanc 80 y.o.   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   She has been on 112 mcg since 01/2013            Compliance with the medical regimen has been as prescribed with taking the tablet in the morning before breakfast.   No visits with results within 1 Week(s) from this visit. Latest known visit with results is:  Orders Only on 04/14/2014  Component Date Value Ref Range Status  . TSH 04/14/2014 0.262* 0.350 - 4.500 uIU/mL Final  . Free T4 04/14/2014 1.76  0.80 - 1.80 ng/dL Final    Lab Results  Component Value Date   TSH 0.262* 04/14/2014   TSH 0.848 10/16/2013   TSH 1.623 08/02/2012     PROBLEM 2: ? Osteoporosis She was concerned about her height loss and the fact that her rib cage on the sides is touching her pelvic bone. She appears to have lost at least 3 inches in height She did however have a normal bone density recently Her lumbar spine x-ray about 2 years ago showed only decreased disc spaces     Medication List       This list is accurate as of: 04/22/14 11:59 PM.  Always use your most recent med list.               amLODipine 5 MG tablet  Commonly known as:  NORVASC  Take 5 mg by mouth every morning.     aspirin EC 81 MG tablet  Take 81 mg by mouth daily.     calcium-vitamin D 500-200 MG-UNIT per tablet  Commonly known as:  OSCAL WITH D  Take 1 tablet by mouth 2 (two) times daily.     DEXILANT 60 MG capsule  Generic drug:  dexlansoprazole  TAKE 1 CAPSULE DAILY     diazepam 5 MG tablet  Commonly known as:  VALIUM  Take 2.5 mg by mouth daily as needed. Sleep     etodolac 300 MG capsule  Commonly known as:  LODINE  Take 300 mg by mouth 2 (two) times daily.     Fish Oil 1200 MG Caps  Take 1 capsule by mouth 2 (two) times daily.     GLUCOSAMINE CHONDR 1500 COMPLX PO  Take 1 tablet by mouth 2 (two) times daily. INCLUDES: Chondroitin 1200mg  (1.2 g)     levocetirizine 5 MG tablet  Commonly known as:  XYZAL  Take 5 mg by mouth every evening.     levothyroxine 100 MCG tablet  Commonly known as:  SYNTHROID, LEVOTHROID  Take 1 tablet (100 mcg total) by mouth daily.     PERI-COLACE PO  Take by mouth. Take  daily     pravastatin 80 MG tablet  Commonly known as:  PRAVACHOL  TAKE 1 TABLET DAILY     PRESCRIPTION MEDICATION  Inject as directed once a week. Allergy Shot that's administered by a nurse weekly.     PROVENTIL HFA 108 (  90 BASE) MCG/ACT inhaler  Generic drug:  albuterol  Inhale 2 puffs into the lungs every 6 (six) hours as needed. Wheezing, Asthma Symptoms     pyridOXINE 100 MG tablet  Commonly known as:  VITAMIN B-6  Take 100 mg by mouth at bedtime.     vitamin B-12 1000 MCG tablet  Commonly known as:  CYANOCOBALAMIN  Take 1,000 mcg by mouth every morning.     vitamin E 400 UNIT capsule  Take 400 Units by mouth at bedtime.        Allergies:  Allergies  Allergen Reactions  . Celecoxib Shortness Of Breath  . Amoxicillin-Pot Clavulanate     AUGMENTIN: Unknown/ Upset stomach *Patient states that she takes Amoxicillin prior to dental procedures*    Past Medical History  Diagnosis Date  . Syncope and collapse 2007    Possible CVA in 2012 with left lower extremity weakness; refused hospitalization; CT-Atrophy and chronic microvascular ischemic change.   . Hypertension   . Asthma   . Degenerative joint disease     Left shoulder; cervical spine  . Gastroesophageal reflux disease     Hiatal hernia; distal esophageal web  requiring dilatation; gastric polyps; gastritis; refuses colonoscopy  . Lymphocytic thyroiditis   . Multiple thyroid nodules 2010    Adenomatous; thyroidectomy in 2010  . Hyperlipidemia 04/27/2011  . Anemia   . GERD (gastroesophageal reflux disease)   . Chronic neck pain   . Chronic back pain     Past Surgical History  Procedure Laterality Date  . Abdominal hysterectomy      fibroids  . Prolapsed uterine fibroid ligation    . Total thyroidectomy  2010  . Esophagogastroduodenoscopy  12/10    small hiatal ernia/gastric polyps/mild gastritis  . Cataract extraction      Bilateral; redo surgery on the right for incomplete primary procedure  . Colonoscopy  Remote  . Shoulder surgery      left    Family History  Problem Relation Age of Onset  . Heart disease Mother   . Gallbladder disease Mother   . Breast cancer Sister   . Colon cancer Neg Hx     Social History:  reports that she quit smoking about 35 years ago. Her smoking use included Cigarettes. She has a 16 pack-year smoking history. She has never used smokeless tobacco. She reports that she does not drink alcohol or use illicit drugs.  REVIEW Of SYSTEMS:  She has a history of anxiety  History of hypertension followed by PCP   Examination:   BP 118/60  Pulse 82  Temp(Src) 98.1 F (36.7 C)  Resp 16  Ht 5\' 2"  (1.575 m)  Wt 128 lb 3.2 oz (58.151 kg)  BMI 23.44 kg/m2  SpO2 92%   GENERAL APPEARANCE:  she looks well.     NECK: no palpable abnormality in the thyroid area      NEUROLOGIC EXAM:  deep tendon reflexes normal at biceps.    Assessments   1. Hypothyroidism, postsurgical with relatively low TSH now using supplementation dose of 112 mcg She will reduce the dose to 100 mcg and followup in 3 months  2. ? Osteoporosis. She did have a normal bone density, however would be reasonable to have her continue calcium and vitamin D  Elayne Snare 04/23/2014, 11:29 AM

## 2014-04-23 ENCOUNTER — Encounter: Payer: Self-pay | Admitting: Endocrinology

## 2014-04-28 DIAGNOSIS — J309 Allergic rhinitis, unspecified: Secondary | ICD-10-CM | POA: Diagnosis not present

## 2014-05-03 DIAGNOSIS — J309 Allergic rhinitis, unspecified: Secondary | ICD-10-CM | POA: Diagnosis not present

## 2014-05-11 DIAGNOSIS — M19019 Primary osteoarthritis, unspecified shoulder: Secondary | ICD-10-CM | POA: Diagnosis not present

## 2014-05-11 DIAGNOSIS — M25569 Pain in unspecified knee: Secondary | ICD-10-CM | POA: Diagnosis not present

## 2014-05-17 DIAGNOSIS — J309 Allergic rhinitis, unspecified: Secondary | ICD-10-CM | POA: Diagnosis not present

## 2014-05-18 DIAGNOSIS — IMO0002 Reserved for concepts with insufficient information to code with codable children: Secondary | ICD-10-CM | POA: Diagnosis not present

## 2014-05-18 DIAGNOSIS — I10 Essential (primary) hypertension: Secondary | ICD-10-CM | POA: Diagnosis not present

## 2014-05-20 DIAGNOSIS — J309 Allergic rhinitis, unspecified: Secondary | ICD-10-CM | POA: Diagnosis not present

## 2014-05-21 DIAGNOSIS — J3089 Other allergic rhinitis: Secondary | ICD-10-CM | POA: Diagnosis not present

## 2014-05-21 DIAGNOSIS — K219 Gastro-esophageal reflux disease without esophagitis: Secondary | ICD-10-CM | POA: Diagnosis not present

## 2014-05-21 DIAGNOSIS — J45909 Unspecified asthma, uncomplicated: Secondary | ICD-10-CM | POA: Diagnosis not present

## 2014-05-25 DIAGNOSIS — Z85828 Personal history of other malignant neoplasm of skin: Secondary | ICD-10-CM | POA: Diagnosis not present

## 2014-05-25 DIAGNOSIS — L570 Actinic keratosis: Secondary | ICD-10-CM | POA: Diagnosis not present

## 2014-05-25 DIAGNOSIS — D235 Other benign neoplasm of skin of trunk: Secondary | ICD-10-CM | POA: Diagnosis not present

## 2014-05-25 DIAGNOSIS — J309 Allergic rhinitis, unspecified: Secondary | ICD-10-CM | POA: Diagnosis not present

## 2014-05-25 DIAGNOSIS — C44319 Basal cell carcinoma of skin of other parts of face: Secondary | ICD-10-CM | POA: Diagnosis not present

## 2014-05-27 ENCOUNTER — Ambulatory Visit (INDEPENDENT_AMBULATORY_CARE_PROVIDER_SITE_OTHER): Payer: Medicare Other | Admitting: Otolaryngology

## 2014-05-27 DIAGNOSIS — H903 Sensorineural hearing loss, bilateral: Secondary | ICD-10-CM | POA: Diagnosis not present

## 2014-05-28 DIAGNOSIS — J309 Allergic rhinitis, unspecified: Secondary | ICD-10-CM | POA: Diagnosis not present

## 2014-05-31 ENCOUNTER — Other Ambulatory Visit: Payer: Self-pay | Admitting: Orthopedic Surgery

## 2014-06-08 DIAGNOSIS — N393 Stress incontinence (female) (male): Secondary | ICD-10-CM | POA: Diagnosis not present

## 2014-06-08 DIAGNOSIS — IMO0002 Reserved for concepts with insufficient information to code with codable children: Secondary | ICD-10-CM | POA: Diagnosis not present

## 2014-06-08 DIAGNOSIS — J309 Allergic rhinitis, unspecified: Secondary | ICD-10-CM | POA: Diagnosis not present

## 2014-06-16 DIAGNOSIS — J309 Allergic rhinitis, unspecified: Secondary | ICD-10-CM | POA: Diagnosis not present

## 2014-06-22 DIAGNOSIS — Z85828 Personal history of other malignant neoplasm of skin: Secondary | ICD-10-CM | POA: Diagnosis not present

## 2014-06-23 DIAGNOSIS — J309 Allergic rhinitis, unspecified: Secondary | ICD-10-CM | POA: Diagnosis not present

## 2014-06-25 DIAGNOSIS — M79609 Pain in unspecified limb: Secondary | ICD-10-CM | POA: Diagnosis not present

## 2014-06-25 DIAGNOSIS — B351 Tinea unguium: Secondary | ICD-10-CM | POA: Diagnosis not present

## 2014-06-30 ENCOUNTER — Ambulatory Visit: Payer: Medicare Other | Admitting: Cardiology

## 2014-07-01 ENCOUNTER — Ambulatory Visit (INDEPENDENT_AMBULATORY_CARE_PROVIDER_SITE_OTHER): Payer: Medicare Other | Admitting: Cardiology

## 2014-07-01 ENCOUNTER — Encounter: Payer: Self-pay | Admitting: Cardiology

## 2014-07-01 VITALS — BP 138/80 | Ht 62.0 in | Wt 124.0 lb

## 2014-07-01 DIAGNOSIS — I10 Essential (primary) hypertension: Secondary | ICD-10-CM | POA: Diagnosis not present

## 2014-07-01 DIAGNOSIS — E785 Hyperlipidemia, unspecified: Secondary | ICD-10-CM

## 2014-07-01 NOTE — Patient Instructions (Signed)
Your physician wants you to follow-up in: 1 year You will receive a reminder letter in the mail two months in advance. If you don't receive a letter, please call our office to schedule the follow-up appointment.     Your physician recommends that you continue on your current medications as directed. Please refer to the Current Medication list given to you today.     Please get fasting lipid blood work when you have your other blood work done     Thank you for choosing [facility] !

## 2014-07-01 NOTE — Progress Notes (Signed)
Clinical Summary Christine Leblanc is a 80 y.o.female former patient of Dr Lattie Haw, this is our first visit together. He is seen for the following medical problems.   1. HTN - no longer on medical therapy  - home numbers 120-130s/60-70s.   2. Hyperlipidemia - compliant with pravastatin - she is unsure of her last panel  3. Hx of TIA - history of is unclear - on ASA and statin for secondary prevention  4. Hypothyroidism - followed by endocrine   Past Medical History  Diagnosis Date  . Syncope and collapse 2007    Possible CVA in 2012 with left lower extremity weakness; refused hospitalization; CT-Atrophy and chronic microvascular ischemic change.   . Hypertension   . Asthma   . Degenerative joint disease     Left shoulder; cervical spine  . Gastroesophageal reflux disease     Hiatal hernia; distal esophageal web requiring dilatation; gastric polyps; gastritis; refuses colonoscopy  . Lymphocytic thyroiditis   . Multiple thyroid nodules 2010    Adenomatous; thyroidectomy in 2010  . Hyperlipidemia 04/27/2011  . Anemia   . GERD (gastroesophageal reflux disease)   . Chronic neck pain   . Chronic back pain      Allergies  Allergen Reactions  . Celecoxib Shortness Of Breath  . Amoxicillin-Pot Clavulanate     AUGMENTIN: Unknown/ Upset stomach *Patient states that she takes Amoxicillin prior to dental procedures*     Current Outpatient Prescriptions  Medication Sig Dispense Refill  . albuterol (PROVENTIL HFA) 108 (90 BASE) MCG/ACT inhaler Inhale 2 puffs into the lungs every 6 (six) hours as needed. Wheezing, Asthma Symptoms      . amLODipine (NORVASC) 5 MG tablet Take 5 mg by mouth every morning.       Marland Kitchen aspirin EC 81 MG tablet Take 81 mg by mouth daily.      . calcium-vitamin D (OSCAL WITH D) 500-200 MG-UNIT per tablet Take 1 tablet by mouth 2 (two) times daily.       Marland Kitchen DEXILANT 60 MG capsule TAKE 1 CAPSULE DAILY  90 capsule  2  . diazepam (VALIUM) 5 MG tablet Take  2.5 mg by mouth daily as needed. Sleep      . etodolac (LODINE) 300 MG capsule Take 300 mg by mouth 2 (two) times daily.      . Glucosamine-Chondroit-Vit C-Mn (GLUCOSAMINE CHONDR 1500 COMPLX PO) Take 1 tablet by mouth 2 (two) times daily. INCLUDES: Chondroitin 1200mg  (1.2 g)      . levocetirizine (XYZAL) 5 MG tablet Take 5 mg by mouth every evening.        Marland Kitchen levothyroxine (SYNTHROID, LEVOTHROID) 100 MCG tablet Take 1 tablet (100 mcg total) by mouth daily.  90 tablet  3  . Omega-3 Fatty Acids (FISH OIL) 1200 MG CAPS Take 1 capsule by mouth 2 (two) times daily.       . pravastatin (PRAVACHOL) 80 MG tablet TAKE 1 TABLET DAILY  90 tablet  3  . PRESCRIPTION MEDICATION Inject as directed once a week. Allergy Shot that's administered by a nurse weekly.       Marland Kitchen pyridOXINE (VITAMIN B-6) 100 MG tablet Take 100 mg by mouth at bedtime.      Christine Leblanc Sodium (PERI-COLACE PO) Take by mouth. Take  daily      . vitamin B-12 (CYANOCOBALAMIN) 1000 MCG tablet Take 1,000 mcg by mouth every morning.      . vitamin E 400 UNIT capsule Take 400 Units by mouth  at bedtime.        No current facility-administered medications for this visit.     Past Surgical History  Procedure Laterality Date  . Abdominal hysterectomy      fibroids  . Prolapsed uterine fibroid ligation    . Total thyroidectomy  2010  . Esophagogastroduodenoscopy  12/10    small hiatal ernia/gastric polyps/mild gastritis  . Cataract extraction      Bilateral; redo surgery on the right for incomplete primary procedure  . Colonoscopy  Remote  . Shoulder surgery      left     Allergies  Allergen Reactions  . Celecoxib Shortness Of Breath  . Amoxicillin-Pot Clavulanate     AUGMENTIN: Unknown/ Upset stomach *Patient states that she takes Amoxicillin prior to dental procedures*      Family History  Problem Relation Age of Onset  . Heart disease Mother   . Gallbladder disease Mother   . Breast cancer Sister   . Colon cancer  Neg Hx      Social History [name] reports that she quit smoking about 35 years ago. Her smoking use included Cigarettes. She has a 16 pack-year smoking history. She has never used smokeless tobacco. [name] reports that she does not drink alcohol.   Review of Systems CONSTITUTIONAL: No weight loss, fever, chills, weakness or fatigue.  HEENT: Eyes: No visual loss, blurred vision, double vision or yellow sclerae.No hearing loss, sneezing, congestion, runny nose or sore throat.  SKIN: No rash or itching.  CARDIOVASCULAR: per HPI RESPIRATORY: No shortness of breath, cough or sputum.  GASTROINTESTINAL: No anorexia, nausea, vomiting or diarrhea. No abdominal pain or blood.  GENITOURINARY: No burning on urination, no polyuria NEUROLOGICAL: No headache, dizziness, syncope, paralysis, ataxia, numbness or tingling in the extremities. No change in bowel or bladder control.  MUSCULOSKELETAL: No muscle, back pain, joint pain or stiffness.  LYMPHATICS: No enlarged nodes. No history of splenectomy.  PSYCHIATRIC: No history of depression or anxiety.  ENDOCRINOLOGIC: No reports of sweating, cold or heat intolerance. No polyuria or polydipsia.  Marland Kitchen   Physical Examination p 82 bp 138/80 Wt 124 lbs BMI 23 Gen: resting comfortably, no acute distress HEENT: no scleral icterus, pupils equal round and reactive, no palptable cervical adenopathy,  CV: RRR, no m/r/g, no JVD, no carotid bruits Resp: Clear to auscultation bilaterally GI: abdomen is soft, non-tender, non-distended, normal bowel sounds, no hepatosplenomegaly MSK: extremities are warm, no edema.  Skin: warm, no rash Neuro:  no focal deficits Psych: appropriate affect   Assessment and Plan  1. HTN - no long requiring medical therapy - home pressures at goal, continue to follow  2. Hyperlipideima - repeat lipid panel  3. Hx of TIA - continue ASA and statin for secondary prevention  F/u 1 year      Arnoldo Lenis,  M.D., F.A.C.C.

## 2014-07-05 DIAGNOSIS — J309 Allergic rhinitis, unspecified: Secondary | ICD-10-CM | POA: Diagnosis not present

## 2014-07-08 ENCOUNTER — Telehealth: Payer: Self-pay | Admitting: Gastroenterology

## 2014-07-08 NOTE — Telephone Encounter (Signed)
Pt is on the recall list for Oct 2015 to see SF. Pt is having a knee replacement later this month and doesn't know if she will be driving or getting around very well in October to make OV. I told her we would touch base with her then in Oct to see how she's doing and decide on when to make OV to see SF. Pt asked if she could go ahead and get her Dexilant refill started with [facility] (90 day supply) so she wouldn't run out before then. Please advise.

## 2014-07-09 NOTE — Telephone Encounter (Signed)
Pt 's last ov was 10/30/2012. I will forward this to Dr. Oneida Alar for review.

## 2014-07-12 DIAGNOSIS — J309 Allergic rhinitis, unspecified: Secondary | ICD-10-CM | POA: Diagnosis not present

## 2014-07-14 ENCOUNTER — Encounter (HOSPITAL_COMMUNITY): Payer: Self-pay | Admitting: Pharmacy Technician

## 2014-07-16 ENCOUNTER — Telehealth: Payer: Self-pay | Admitting: Endocrinology

## 2014-07-16 NOTE — Telephone Encounter (Signed)
Patient would like her lab orders faxed to [facility] [phone]  Thank You

## 2014-07-16 NOTE — Telephone Encounter (Signed)
Orders faxed

## 2014-07-17 NOTE — Pre-Procedure Instructions (Signed)
Christine Leblanc  07/17/2014   Your procedure is scheduled on:  July 28  Report to [facility] Admitting at 05:30 AM.  Call this number if you have problems the morning of surgery: [phone]   Remember:   Do not eat food or drink liquids after midnight.   Take these medicines the morning of surgery with A SIP OF WATER: Albuterol, Dexilant, Levothyroxine,    STOP Aspirin, Oscal, Lodine, Glucosamine, Fish Oil, Vitamin B6, Vitamin B12, Vitamin E today   STOP/ Do not take Aspirin, Aleve, Naproxen, Advil, Ibuprofen, Motrin, Vitamins, Herbs, or Supplements starting today   Do not wear jewelry, make-up or nail polish.  Do not wear lotions, powders, or perfumes. You may wear deodorant.  Do not shave 48 hours prior to surgery. Men may shave face and neck.  Do not bring valuables to the hospital.  [facility] is not responsible for any belongings or valuables.               Contacts, dentures or bridgework may not be worn into surgery.  Leave suitcase in the car. After surgery it may be brought to your room.  For patients admitted to the hospital, discharge time is determined by your treatment team.               Special Instructions: See [facility] Preparing For Surgery   Please read over the following fact sheets that you were given: Pain Booklet, Coughing and Deep Breathing, Blood Transfusion Information, Total Joint Packet and Surgical Site Infection Prevention

## 2014-07-19 ENCOUNTER — Encounter (HOSPITAL_COMMUNITY)
Admission: RE | Admit: 2014-07-19 | Discharge: 2014-07-19 | Disposition: A | Discharge status: Home / Self Care | Payer: Medicare Other | Source: Ambulatory Visit | Attending: Orthopedic Surgery | Admitting: Orthopedic Surgery

## 2014-07-19 ENCOUNTER — Ambulatory Visit (HOSPITAL_COMMUNITY)
Admission: RE | Admit: 2014-07-19 | Discharge: 2014-07-19 | Disposition: A | Discharge status: Home / Self Care | Payer: Medicare Other | Source: Ambulatory Visit | Attending: Orthopedic Surgery | Admitting: Orthopedic Surgery

## 2014-07-19 ENCOUNTER — Encounter (HOSPITAL_COMMUNITY): Payer: Self-pay

## 2014-07-19 DIAGNOSIS — J309 Allergic rhinitis, unspecified: Secondary | ICD-10-CM | POA: Diagnosis not present

## 2014-07-19 DIAGNOSIS — Z87891 Personal history of nicotine dependence: Secondary | ICD-10-CM | POA: Diagnosis not present

## 2014-07-19 DIAGNOSIS — Z01818 Encounter for other preprocedural examination: Secondary | ICD-10-CM | POA: Insufficient documentation

## 2014-07-19 DIAGNOSIS — I10 Essential (primary) hypertension: Secondary | ICD-10-CM | POA: Diagnosis not present

## 2014-07-19 HISTORY — DX: Pneumonia, unspecified organism: J18.9

## 2014-07-19 HISTORY — DX: Malignant (primary) neoplasm, unspecified: C80.1

## 2014-07-19 HISTORY — DX: Personal history of other medical treatment: Z92.89

## 2014-07-19 HISTORY — DX: Hypothyroidism, unspecified: E03.9

## 2014-07-19 HISTORY — DX: Unspecified convulsions: R56.9

## 2014-07-19 LAB — PROTIME-INR
INR: 1.08 (ref 0.00–1.49)
Prothrombin Time: 14 seconds (ref 11.6–15.2)

## 2014-07-19 LAB — BASIC METABOLIC PANEL
Anion gap: 11 (ref 5–15)
BUN: 23 mg/dL (ref 6–23)
CHLORIDE: 102 meq/L (ref 96–112)
CO2: 28 meq/L (ref 19–32)
Calcium: 9.4 mg/dL (ref 8.4–10.5)
Creatinine, Ser: 0.67 mg/dL (ref 0.50–1.10)
GFR calc Af Amer: >90 mL/min (ref >90)
GFR, EST NON AFRICAN AMERICAN: 79 mL/min — AB (ref >90)
GLUCOSE: 86 mg/dL (ref 70–99)
POTASSIUM: 4.6 meq/L (ref 3.7–5.3)
SODIUM: 141 meq/L (ref 137–147)

## 2014-07-19 LAB — CBC
HEMATOCRIT: 38 % (ref 36.0–46.0)
HEMOGLOBIN: 12.3 g/dL (ref 12.0–15.0)
MCH: 29.6 pg (ref 26.0–34.0)
MCHC: 32.4 g/dL (ref 30.0–36.0)
MCV: 91.6 fL (ref 78.0–100.0)
Platelets: 183 10*3/uL (ref 150–400)
RBC: 4.15 MIL/uL (ref 3.87–5.11)
RDW: 13.7 % (ref 11.5–15.5)
WBC: 6.3 10*3/uL (ref 4.0–10.5)

## 2014-07-19 LAB — SURGICAL PCR SCREEN
MRSA, PCR: NEGATIVE
STAPHYLOCOCCUS AUREUS: NEGATIVE

## 2014-07-19 LAB — APTT: aPTT: 34 seconds (ref 24–37)

## 2014-07-19 NOTE — Progress Notes (Signed)
Pt. Aware of need to stop NSAIDS, aspirin, vitamins.

## 2014-07-20 NOTE — Progress Notes (Signed)
Anesthesia Chart Review:  Patient is a 80 year old female scheduled for right TKA on 07/27/14 by Dr. Mardelle Matte.  History includes HTN, HLD, syncope secondary to dehydration '13, TIA '12, lymphocytic thyroiditis with multiple thyroid nodules s/p thyroidectomy '10 now with secondary hypothyroidism, former smoker, chronic neck and back pain, remote history of PNA, GERD, seizures as a child and young adult (no medications since age 30), skin cancer, anemia, hysterectomy.  PCP is listed as Dr. Elsie Lincoln. Cardiologist is Dr. Harl Bowie, last visit 07/01/14 with one year follow-up recommended (followed for HTN and HLD; no known CAD/MI history).  Endocrinologist is Dr. Elayne Snare.  EKG on 07/19/14 showed: NSR, possible LAE, RSR prime or QR pattern in V1 suggests RV conduction delay/incomplete right BBB. She has ST segment elevation/repolarization abnormality in inferior leads and in V3 which was present on prior EKG from 08/02/12. She had a right BBB on her 08/02/12 EKG.  Prior stress test was 20 years ago.  Carotid duplex on 12/12/11 showed: < 50% bilateral carotid bifurcation and proximal ICA plaque.  Exam does not exclude plaque ulceration or embolization.  Preoperative CXR and labs noted.  Patient without known CAD history.  She was recently evaluated by her cardiologist with no cardiac testing ordered at that time.  I think her EKG appears stable since 07/2012. Further evaluation by her assigned anesthesiologist on the day of surgery, but if no significant changes or acute CV symptoms then it is anticipated that she can proceed as planned.  Anesthesiologist Dr. Tamala Julian agrees with this plan.  George Hugh [facility] Short Stay Center/Anesthesiology Phone [phone] 07/20/2014 12:51 PM

## 2014-07-21 ENCOUNTER — Other Ambulatory Visit: Payer: Self-pay | Admitting: Endocrinology

## 2014-07-21 DIAGNOSIS — E039 Hypothyroidism, unspecified: Secondary | ICD-10-CM | POA: Diagnosis not present

## 2014-07-21 DIAGNOSIS — E785 Hyperlipidemia, unspecified: Secondary | ICD-10-CM | POA: Diagnosis not present

## 2014-07-22 LAB — T4, FREE: Free T4: 1.34 ng/dL (ref 0.80–1.80)

## 2014-07-22 LAB — LIPID PANEL
CHOL/HDL RATIO: 2.8 ratio
CHOLESTEROL: 156 mg/dL (ref 0–200)
HDL: 55 mg/dL (ref >39)
LDL Cholesterol: 87 mg/dL (ref 0–99)
TRIGLYCERIDES: 72 mg/dL (ref <150)
VLDL: 14 mg/dL (ref 0–40)

## 2014-07-22 LAB — TSH: TSH: 2.546 u[IU]/mL (ref 0.350–4.500)

## 2014-07-23 ENCOUNTER — Ambulatory Visit (INDEPENDENT_AMBULATORY_CARE_PROVIDER_SITE_OTHER): Payer: Medicare Other | Admitting: Endocrinology

## 2014-07-23 ENCOUNTER — Encounter: Payer: Self-pay | Admitting: Endocrinology

## 2014-07-23 VITALS — BP 150/73 | HR 73 | Temp 98.3°F | Resp 14 | Ht 62.0 in | Wt 129.0 lb

## 2014-07-23 DIAGNOSIS — E039 Hypothyroidism, unspecified: Secondary | ICD-10-CM

## 2014-07-23 NOTE — Progress Notes (Signed)
Christine Leblanc 80 y.o.   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   She has been on 100 mcg since 03/2014           Compliance with the medical regimen has been as prescribed with taking the tablet in the morning before breakfast.    Lab Results  Component Value Date   TSH 2.546 07/21/2014   TSH 0.262* 04/14/2014   TSH 0.848 10/16/2013     PROBLEM 2: Fatigue: She thinks she has been fatigued for several years, even before her thyroid surgery Does not think she has anemia recently     Medication List       This list is accurate as of: 07/23/14  1:08 PM.  Always use your most recent med list.               aspirin EC 81 MG tablet  Take 81 mg by mouth daily before breakfast.     calcium-vitamin D 500-200 MG-UNIT per tablet  Commonly known as:  OSCAL WITH D  Take 1 tablet by mouth 2 (two) times daily.     DEXILANT 60 MG capsule  Generic drug:  dexlansoprazole  Take 60 mg by mouth daily before breakfast.     diazepam 5 MG tablet  Commonly known as:  VALIUM  Take 5 mg by mouth daily as needed. Sleep     etodolac 300 MG capsule  Commonly known as:  LODINE  Take 300 mg by mouth 2 (two) times daily.     Fish Oil 1200 MG Caps  Take 1 capsule by mouth 2 (two) times daily.     GLUCOSAMINE CHONDR 1500 COMPLX PO  Take 1 tablet by mouth 2 (two) times daily. INCLUDES: Chondroitin 1200mg  (1.2 g)     levocetirizine 5 MG tablet  Commonly  known as:  XYZAL  Take 5 mg by mouth every evening.     levothyroxine 100 MCG tablet  Commonly known as:  SYNTHROID, LEVOTHROID  Take 100 mcg by mouth daily before breakfast.     montelukast 10 MG tablet  Commonly known as:  SINGULAIR  Take 10 mg by mouth at bedtime.     PERI-COLACE PO  Take 1 tablet by mouth 2 (two) times daily.     pravastatin 80 MG tablet  Commonly known as:  PRAVACHOL  Take 80 mg by mouth daily.     PROVENTIL HFA 108 (90 BASE) MCG/ACT inhaler  Generic drug:  albuterol  Inhale 2 puffs into the lungs every 6 (six) hours as needed. Wheezing, Asthma Symptoms     pyridOXINE 100 MG tablet  Commonly known as:  VITAMIN B-6  Take 100 mg by mouth at bedtime.     vitamin B-12 1000 MCG tablet  Commonly known as:  CYANOCOBALAMIN  Take 1,000 mcg by mouth every morning.     vitamin E 400 UNIT capsule  Take 400 Units by  mouth at bedtime.        Allergies:  Allergies  Allergen Reactions  . Celecoxib Shortness Of Breath    Past Medical History  Diagnosis Date  . Syncope and collapse 2007    Possible CVA in 2012 with left lower extremity weakness; refused hospitalization; CT-Atrophy and chronic microvascular ischemic change.   . Asthma   . Gastroesophageal reflux disease     Hiatal hernia; distal esophageal web requiring dilatation; gastric polyps; gastritis; refuses colonoscopy  . Lymphocytic thyroiditis   . Multiple thyroid nodules 2010    Adenomatous; thyroidectomy in 2010  . Hyperlipidemia 04/27/2011  . Anemia   . GERD (gastroesophageal reflux disease)   . Chronic neck pain   . Chronic back pain   . Pneumonia     hosp. for pneumonia- long time ago   . Hypothyroidism   . Seizures     yes- as a child- & into adult years, states she took med. for them at one time, stopped at 32 yrs. of age   . Degenerative joint disease     Left shoulder; cervical spine, knees & hands   . Cancer     skin Ca- ? basal cell   . History of stress test 1990's    stress  test done under the care of Dr. Lattie Haw & Dr. Gwenlyn Found, now being followed by Dr. Harl Bowie- in [place] , recently seen & told to f/U in one yr.   . Hypertension     Past Surgical History  Procedure Laterality Date  . Abdominal hysterectomy      fibroids  . Prolapsed uterine fibroid ligation      outcomed with rectocele & cystocele  . Total thyroidectomy  2010  . Esophagogastroduodenoscopy  12/10    small hiatal ernia/gastric polyps/mild gastritis  . Cataract extraction      Bilateral; redo surgery on the right for incomplete primary procedure  . Colonoscopy  Remote  . Shoulder surgery      left    Family History  Problem Relation Age of Onset  . Heart disease Mother   . Gallbladder disease Mother   . Breast cancer Sister   . Colon cancer Neg Hx     Social History:  reports that she quit smoking about 35 years ago. Her smoking use included Cigarettes. She has a 16 pack-year smoking history. She has never used smokeless tobacco. She reports that she does not drink alcohol or use illicit drugs.  REVIEW Of SYSTEMS:  She has a history of anxiety  History of hypertension followed by PCP, blood pressure is high but she has had some stress   Examination:   BP 150/73  Pulse 73  Temp(Src) 98.3 F (36.8 C)  Resp 14  Ht 5\' 2"  (1.575 m)  Wt 129 lb (58.514 kg)  BMI 23.59 kg/m2  SpO2 98%   GENERAL APPEARANCE:  she looks well.         Assessments   1. Hypothyroidism, postsurgical with normal TSH now using supplementation dose of 100 mcg She will continue the same dose followup in 6 months  2. Fatigue of unclear etiology. She will discuss with PCP. If this persists may consider switching to Armour Thyroid  Christine Leblanc 07/23/2014, 1:08 PM

## 2014-07-26 DIAGNOSIS — J309 Allergic rhinitis, unspecified: Secondary | ICD-10-CM | POA: Diagnosis not present

## 2014-07-26 MED ORDER — CLINDAMYCIN PHOSPHATE 900 MG/50ML IV SOLN
900.0000 mg | INTRAVENOUS | Status: AC
  Administered 2014-07-27: 900 mg via INTRAVENOUS
  Filled 2014-07-26: qty 50

## 2014-07-27 ENCOUNTER — Encounter (HOSPITAL_COMMUNITY)
Admission: RE | Disposition: A | Discharge status: Skilled Nursing Facility | Payer: Self-pay | Source: Ambulatory Visit | Attending: Orthopedic Surgery

## 2014-07-27 ENCOUNTER — Inpatient Hospital Stay (HOSPITAL_COMMUNITY): Payer: Medicare Other | Admitting: Anesthesiology

## 2014-07-27 ENCOUNTER — Encounter (HOSPITAL_COMMUNITY): Payer: Medicare Other | Admitting: Vascular Surgery

## 2014-07-27 ENCOUNTER — Encounter (HOSPITAL_COMMUNITY): Payer: Self-pay | Admitting: Anesthesiology

## 2014-07-27 ENCOUNTER — Inpatient Hospital Stay (HOSPITAL_COMMUNITY): Payer: Medicare Other

## 2014-07-27 ENCOUNTER — Inpatient Hospital Stay (HOSPITAL_COMMUNITY)
Admission: RE | Admit: 2014-07-27 | Discharge: 2014-08-03 | DRG: 470 | Disposition: A | Discharge status: Skilled Nursing Facility | Payer: Medicare Other | Source: Ambulatory Visit | Attending: Orthopedic Surgery | Admitting: Orthopedic Surgery

## 2014-07-27 DIAGNOSIS — J45909 Unspecified asthma, uncomplicated: Secondary | ICD-10-CM | POA: Diagnosis present

## 2014-07-27 DIAGNOSIS — E063 Autoimmune thyroiditis: Secondary | ICD-10-CM | POA: Diagnosis present

## 2014-07-27 DIAGNOSIS — K922 Gastrointestinal hemorrhage, unspecified: Secondary | ICD-10-CM | POA: Diagnosis not present

## 2014-07-27 DIAGNOSIS — I10 Essential (primary) hypertension: Secondary | ICD-10-CM | POA: Diagnosis present

## 2014-07-27 DIAGNOSIS — R41841 Cognitive communication deficit: Secondary | ICD-10-CM | POA: Diagnosis not present

## 2014-07-27 DIAGNOSIS — G8929 Other chronic pain: Secondary | ICD-10-CM | POA: Diagnosis present

## 2014-07-27 DIAGNOSIS — K219 Gastro-esophageal reflux disease without esophagitis: Secondary | ICD-10-CM | POA: Diagnosis present

## 2014-07-27 DIAGNOSIS — G8918 Other acute postprocedural pain: Secondary | ICD-10-CM | POA: Diagnosis not present

## 2014-07-27 DIAGNOSIS — Z96619 Presence of unspecified artificial shoulder joint: Secondary | ICD-10-CM | POA: Diagnosis not present

## 2014-07-27 DIAGNOSIS — R05 Cough: Secondary | ICD-10-CM | POA: Diagnosis not present

## 2014-07-27 DIAGNOSIS — N289 Disorder of kidney and ureter, unspecified: Secondary | ICD-10-CM | POA: Diagnosis not present

## 2014-07-27 DIAGNOSIS — R059 Cough, unspecified: Secondary | ICD-10-CM | POA: Diagnosis not present

## 2014-07-27 DIAGNOSIS — Z7982 Long term (current) use of aspirin: Secondary | ICD-10-CM | POA: Diagnosis not present

## 2014-07-27 DIAGNOSIS — R0902 Hypoxemia: Secondary | ICD-10-CM | POA: Diagnosis not present

## 2014-07-27 DIAGNOSIS — M1711 Unilateral primary osteoarthritis, right knee: Secondary | ICD-10-CM

## 2014-07-27 DIAGNOSIS — J9819 Other pulmonary collapse: Secondary | ICD-10-CM | POA: Diagnosis not present

## 2014-07-27 DIAGNOSIS — Z87891 Personal history of nicotine dependence: Secondary | ICD-10-CM

## 2014-07-27 DIAGNOSIS — E785 Hyperlipidemia, unspecified: Secondary | ICD-10-CM | POA: Diagnosis present

## 2014-07-27 DIAGNOSIS — Z888 Allergy status to other drugs, medicaments and biological substances status: Secondary | ICD-10-CM | POA: Diagnosis not present

## 2014-07-27 DIAGNOSIS — F19921 Other psychoactive substance use, unspecified with intoxication with delirium: Secondary | ICD-10-CM

## 2014-07-27 DIAGNOSIS — M179 Osteoarthritis of knee, unspecified: Secondary | ICD-10-CM | POA: Diagnosis present

## 2014-07-27 DIAGNOSIS — Z803 Family history of malignant neoplasm of breast: Secondary | ICD-10-CM

## 2014-07-27 DIAGNOSIS — T40605A Adverse effect of unspecified narcotics, initial encounter: Secondary | ICD-10-CM | POA: Diagnosis not present

## 2014-07-27 DIAGNOSIS — R262 Difficulty in walking, not elsewhere classified: Secondary | ICD-10-CM | POA: Diagnosis not present

## 2014-07-27 DIAGNOSIS — M1710 Unilateral primary osteoarthritis, unspecified knee: Principal | ICD-10-CM | POA: Diagnosis present

## 2014-07-27 DIAGNOSIS — M549 Dorsalgia, unspecified: Secondary | ICD-10-CM | POA: Diagnosis present

## 2014-07-27 DIAGNOSIS — M25569 Pain in unspecified knee: Secondary | ICD-10-CM | POA: Diagnosis not present

## 2014-07-27 DIAGNOSIS — G40909 Epilepsy, unspecified, not intractable, without status epilepticus: Secondary | ICD-10-CM | POA: Diagnosis present

## 2014-07-27 DIAGNOSIS — Z8249 Family history of ischemic heart disease and other diseases of the circulatory system: Secondary | ICD-10-CM

## 2014-07-27 DIAGNOSIS — M1990 Unspecified osteoarthritis, unspecified site: Secondary | ICD-10-CM | POA: Diagnosis not present

## 2014-07-27 DIAGNOSIS — Z471 Aftercare following joint replacement surgery: Secondary | ICD-10-CM | POA: Diagnosis not present

## 2014-07-27 DIAGNOSIS — S8990XA Unspecified injury of unspecified lower leg, initial encounter: Secondary | ICD-10-CM | POA: Diagnosis not present

## 2014-07-27 DIAGNOSIS — M542 Cervicalgia: Secondary | ICD-10-CM | POA: Diagnosis present

## 2014-07-27 DIAGNOSIS — B373 Candidiasis of vulva and vagina: Secondary | ICD-10-CM | POA: Diagnosis not present

## 2014-07-27 DIAGNOSIS — R1310 Dysphagia, unspecified: Secondary | ICD-10-CM | POA: Diagnosis not present

## 2014-07-27 DIAGNOSIS — B3731 Acute candidiasis of vulva and vagina: Secondary | ICD-10-CM | POA: Diagnosis not present

## 2014-07-27 DIAGNOSIS — Z85828 Personal history of other malignant neoplasm of skin: Secondary | ICD-10-CM | POA: Diagnosis not present

## 2014-07-27 DIAGNOSIS — R1319 Other dysphagia: Secondary | ICD-10-CM | POA: Diagnosis not present

## 2014-07-27 DIAGNOSIS — M6281 Muscle weakness (generalized): Secondary | ICD-10-CM | POA: Diagnosis not present

## 2014-07-27 DIAGNOSIS — E8779 Other fluid overload: Secondary | ICD-10-CM | POA: Diagnosis not present

## 2014-07-27 DIAGNOSIS — T50905A Adverse effect of unspecified drugs, medicaments and biological substances, initial encounter: Secondary | ICD-10-CM | POA: Diagnosis not present

## 2014-07-27 DIAGNOSIS — D62 Acute posthemorrhagic anemia: Secondary | ICD-10-CM | POA: Diagnosis not present

## 2014-07-27 DIAGNOSIS — E039 Hypothyroidism, unspecified: Secondary | ICD-10-CM | POA: Diagnosis present

## 2014-07-27 DIAGNOSIS — R404 Transient alteration of awareness: Secondary | ICD-10-CM | POA: Diagnosis not present

## 2014-07-27 DIAGNOSIS — K5900 Constipation, unspecified: Secondary | ICD-10-CM | POA: Diagnosis not present

## 2014-07-27 DIAGNOSIS — Z96659 Presence of unspecified artificial knee joint: Secondary | ICD-10-CM | POA: Diagnosis not present

## 2014-07-27 DIAGNOSIS — R918 Other nonspecific abnormal finding of lung field: Secondary | ICD-10-CM | POA: Diagnosis not present

## 2014-07-27 DIAGNOSIS — R195 Other fecal abnormalities: Secondary | ICD-10-CM | POA: Diagnosis not present

## 2014-07-27 DIAGNOSIS — R279 Unspecified lack of coordination: Secondary | ICD-10-CM | POA: Diagnosis not present

## 2014-07-27 DIAGNOSIS — J9811 Atelectasis: Secondary | ICD-10-CM | POA: Diagnosis not present

## 2014-07-27 DIAGNOSIS — S99919A Unspecified injury of unspecified ankle, initial encounter: Secondary | ICD-10-CM | POA: Diagnosis not present

## 2014-07-27 DIAGNOSIS — D509 Iron deficiency anemia, unspecified: Secondary | ICD-10-CM | POA: Diagnosis not present

## 2014-07-27 HISTORY — DX: Unilateral primary osteoarthritis, right knee: M17.11

## 2014-07-27 HISTORY — PX: TOTAL KNEE ARTHROPLASTY: SHX125

## 2014-07-27 SURGERY — ARTHROPLASTY, KNEE, TOTAL
Anesthesia: Regional | Site: Knee | Laterality: Right

## 2014-07-27 MED ORDER — FENTANYL CITRATE 0.05 MG/ML IJ SOLN
INTRAMUSCULAR | Status: AC
  Filled 2014-07-27: qty 5

## 2014-07-27 MED ORDER — SENNA 8.6 MG PO TABS
1.0000 | ORAL_TABLET | Freq: Two times a day (BID) | ORAL | Status: DC
  Administered 2014-07-27 – 2014-08-03 (×14): 8.6 mg via ORAL
  Filled 2014-07-27 (×17): qty 1

## 2014-07-27 MED ORDER — RIVAROXABAN 10 MG PO TABS: 10.0000 mg | ORAL_TABLET | Freq: Every day | ORAL | Status: DC

## 2014-07-27 MED ORDER — ETODOLAC 300 MG PO CAPS
300.0000 mg | ORAL_CAPSULE | Freq: Two times a day (BID) | ORAL | Status: DC
  Administered 2014-07-27 – 2014-07-30 (×5): 300 mg via ORAL
  Filled 2014-07-27 (×8): qty 1

## 2014-07-27 MED ORDER — DIAZEPAM 5 MG PO TABS
5.0000 mg | ORAL_TABLET | Freq: Every day | ORAL | Status: DC | PRN
  Administered 2014-07-27 – 2014-07-31 (×2): 5 mg via ORAL
  Filled 2014-07-27 (×2): qty 1

## 2014-07-27 MED ORDER — DEXAMETHASONE 6 MG PO TABS
10.0000 mg | ORAL_TABLET | Freq: Three times a day (TID) | ORAL | Status: AC
  Administered 2014-07-27 (×2): 10 mg via ORAL
  Filled 2014-07-27 (×3): qty 1

## 2014-07-27 MED ORDER — BISACODYL 10 MG RE SUPP
10.0000 mg | Freq: Every day | RECTAL | Status: DC | PRN
  Filled 2014-07-27: qty 1

## 2014-07-27 MED ORDER — METHOCARBAMOL 1000 MG/10ML IJ SOLN
500.0000 mg | Freq: Four times a day (QID) | INTRAVENOUS | Status: DC | PRN
  Filled 2014-07-27: qty 5

## 2014-07-27 MED ORDER — DIPHENHYDRAMINE HCL 12.5 MG/5ML PO ELIX
12.5000 mg | ORAL_SOLUTION | ORAL | Status: DC | PRN
  Administered 2014-07-29: 25 mg via ORAL
  Filled 2014-07-27: qty 10

## 2014-07-27 MED ORDER — PHENYLEPHRINE HCL 10 MG/ML IJ SOLN
INTRAMUSCULAR | Status: DC | PRN
  Administered 2014-07-27: 80 ug via INTRAVENOUS
  Administered 2014-07-27 (×8): 40 ug via INTRAVENOUS

## 2014-07-27 MED ORDER — MORPHINE SULFATE 2 MG/ML IJ SOLN
1.0000 mg | INTRAMUSCULAR | Status: DC | PRN
  Administered 2014-07-27: 1 mg via INTRAVENOUS
  Administered 2014-07-28: 2 mg via INTRAVENOUS
  Filled 2014-07-27 (×2): qty 1

## 2014-07-27 MED ORDER — ACETAMINOPHEN 650 MG RE SUPP: 650.0000 mg | Freq: Four times a day (QID) | RECTAL | Status: DC | PRN

## 2014-07-27 MED ORDER — PHENOL 1.4 % MT LIQD: 1.0000 | OROMUCOSAL | Status: DC | PRN

## 2014-07-27 MED ORDER — ONDANSETRON HCL 4 MG/2ML IJ SOLN
INTRAMUSCULAR | Status: AC
  Filled 2014-07-27: qty 2

## 2014-07-27 MED ORDER — FENTANYL CITRATE 0.05 MG/ML IJ SOLN
INTRAMUSCULAR | Status: AC
  Administered 2014-07-27: 50 ug
  Filled 2014-07-27: qty 2

## 2014-07-27 MED ORDER — SODIUM CHLORIDE 0.9 % IR SOLN
Status: DC | PRN
  Administered 2014-07-27: 1000 mL

## 2014-07-27 MED ORDER — CALCIUM CARBONATE-VITAMIN D 500-200 MG-UNIT PO TABS
1.0000 | ORAL_TABLET | Freq: Two times a day (BID) | ORAL | Status: DC
  Administered 2014-07-27 – 2014-08-03 (×13): 1 via ORAL
  Filled 2014-07-27 (×16): qty 1

## 2014-07-27 MED ORDER — ONDANSETRON HCL 4 MG/2ML IJ SOLN: 4.0000 mg | Freq: Once | INTRAMUSCULAR | Status: DC | PRN

## 2014-07-27 MED ORDER — RIVAROXABAN 10 MG PO TABS
10.0000 mg | ORAL_TABLET | Freq: Every day | ORAL | Status: DC
  Administered 2014-07-28 – 2014-08-01 (×5): 10 mg via ORAL
  Filled 2014-07-27 (×7): qty 1

## 2014-07-27 MED ORDER — PROPOFOL 10 MG/ML IV BOLUS
INTRAVENOUS | Status: AC
  Filled 2014-07-27: qty 20

## 2014-07-27 MED ORDER — ONDANSETRON HCL 4 MG/2ML IJ SOLN
INTRAMUSCULAR | Status: DC | PRN
  Administered 2014-07-27: 4 mg via INTRAVENOUS

## 2014-07-27 MED ORDER — LORATADINE 10 MG PO TABS
10.0000 mg | ORAL_TABLET | Freq: Every evening | ORAL | Status: DC
  Administered 2014-07-27 – 2014-08-02 (×5): 10 mg via ORAL
  Filled 2014-07-27 (×10): qty 1

## 2014-07-27 MED ORDER — LEVOTHYROXINE SODIUM 100 MCG PO TABS
100.0000 ug | ORAL_TABLET | Freq: Every day | ORAL | Status: DC
  Administered 2014-07-28 – 2014-08-03 (×7): 100 ug via ORAL
  Filled 2014-07-27 (×8): qty 1

## 2014-07-27 MED ORDER — POLYETHYLENE GLYCOL 3350 17 G PO PACK
17.0000 g | PACK | Freq: Every day | ORAL | Status: DC | PRN
  Administered 2014-07-30: 17 g via ORAL
  Filled 2014-07-27 (×2): qty 1

## 2014-07-27 MED ORDER — LACTATED RINGERS IV SOLN
INTRAVENOUS | Status: DC | PRN
  Administered 2014-07-27 (×2): via INTRAVENOUS

## 2014-07-27 MED ORDER — DEXAMETHASONE SODIUM PHOSPHATE 4 MG/ML IJ SOLN
10.0000 mg | Freq: Three times a day (TID) | INTRAMUSCULAR | Status: DC
  Administered 2014-07-28 – 2014-07-30 (×6): 10 mg via INTRAVENOUS
  Filled 2014-07-27 (×13): qty 2.5

## 2014-07-27 MED ORDER — HYDROCODONE-ACETAMINOPHEN 10-325 MG PO TABS: 1.0000 | ORAL_TABLET | Freq: Four times a day (QID) | ORAL | Status: DC | PRN

## 2014-07-27 MED ORDER — SODIUM CHLORIDE 0.9 % IJ SOLN
INTRAMUSCULAR | Status: AC
  Filled 2014-07-27: qty 10

## 2014-07-27 MED ORDER — BACLOFEN 10 MG PO TABS: 10.0000 mg | ORAL_TABLET | Freq: Three times a day (TID) | ORAL | Status: DC

## 2014-07-27 MED ORDER — ACETAMINOPHEN 325 MG PO TABS
650.0000 mg | ORAL_TABLET | Freq: Four times a day (QID) | ORAL | Status: DC | PRN
  Filled 2014-07-27: qty 2

## 2014-07-27 MED ORDER — LIDOCAINE HCL (CARDIAC) 20 MG/ML IV SOLN
INTRAVENOUS | Status: AC
  Filled 2014-07-27: qty 5

## 2014-07-27 MED ORDER — ALBUTEROL SULFATE (2.5 MG/3ML) 0.083% IN NEBU
2.5000 mg | INHALATION_SOLUTION | Freq: Four times a day (QID) | RESPIRATORY_TRACT | Status: DC | PRN
  Administered 2014-07-29 – 2014-07-31 (×3): 2.5 mg via RESPIRATORY_TRACT
  Filled 2014-07-27 (×3): qty 3

## 2014-07-27 MED ORDER — LIDOCAINE HCL (CARDIAC) 20 MG/ML IV SOLN
INTRAVENOUS | Status: DC | PRN
  Administered 2014-07-27: 100 mg via INTRAVENOUS

## 2014-07-27 MED ORDER — DEXAMETHASONE SODIUM PHOSPHATE 10 MG/ML IJ SOLN
10.0000 mg | Freq: Three times a day (TID) | INTRAMUSCULAR | Status: DC
  Filled 2014-07-27 (×3): qty 1

## 2014-07-27 MED ORDER — PANTOPRAZOLE SODIUM 40 MG PO TBEC
40.0000 mg | DELAYED_RELEASE_TABLET | Freq: Every day | ORAL | Status: DC
  Administered 2014-07-27 – 2014-08-03 (×7): 40 mg via ORAL
  Filled 2014-07-27 (×7): qty 1

## 2014-07-27 MED ORDER — LIDOCAINE HCL (CARDIAC) 20 MG/ML IV SOLN
INTRAVENOUS | Status: AC
Start: 2014-07-27 — End: 2014-07-27
  Filled 2014-07-27: qty 5

## 2014-07-27 MED ORDER — FENTANYL CITRATE 0.05 MG/ML IJ SOLN
INTRAMUSCULAR | Status: DC | PRN
  Administered 2014-07-27 (×2): 25 ug via INTRAVENOUS

## 2014-07-27 MED ORDER — ONDANSETRON HCL 4 MG PO TABS: 4.0000 mg | ORAL_TABLET | Freq: Four times a day (QID) | ORAL | Status: DC | PRN

## 2014-07-27 MED ORDER — ASPIRIN EC 81 MG PO TBEC
81.0000 mg | DELAYED_RELEASE_TABLET | Freq: Every day | ORAL | Status: DC
  Administered 2014-07-28 – 2014-08-02 (×6): 81 mg via ORAL
  Filled 2014-07-27 (×9): qty 1

## 2014-07-27 MED ORDER — CEFAZOLIN SODIUM-DEXTROSE 2-3 GM-% IV SOLR
2.0000 g | Freq: Four times a day (QID) | INTRAVENOUS | Status: AC
  Administered 2014-07-27 (×2): 2 g via INTRAVENOUS
  Filled 2014-07-27 (×2): qty 50

## 2014-07-27 MED ORDER — EPHEDRINE SULFATE 50 MG/ML IJ SOLN
INTRAMUSCULAR | Status: AC
  Filled 2014-07-27: qty 1

## 2014-07-27 MED ORDER — VITAMIN B-12 1000 MCG PO TABS
1000.0000 ug | ORAL_TABLET | ORAL | Status: DC
  Administered 2014-07-28 – 2014-08-03 (×6): 1000 ug via ORAL
  Filled 2014-07-27 (×9): qty 1

## 2014-07-27 MED ORDER — ONDANSETRON HCL 4 MG PO TABS: 4.0000 mg | ORAL_TABLET | Freq: Three times a day (TID) | ORAL | Status: DC | PRN

## 2014-07-27 MED ORDER — SENNOSIDES-DOCUSATE SODIUM 8.6-50 MG PO TABS
1.0000 | ORAL_TABLET | Freq: Every day | ORAL | Status: DC
  Administered 2014-07-27 – 2014-08-02 (×6): 1 via ORAL
  Filled 2014-07-27 (×7): qty 1

## 2014-07-27 MED ORDER — VITAMIN E 180 MG (400 UNIT) PO CAPS
400.0000 [IU] | ORAL_CAPSULE | Freq: Every day | ORAL | Status: DC
  Administered 2014-07-27 – 2014-08-02 (×6): 400 [IU] via ORAL
  Filled 2014-07-27 (×8): qty 1

## 2014-07-27 MED ORDER — SIMVASTATIN 40 MG PO TABS
40.0000 mg | ORAL_TABLET | Freq: Every day | ORAL | Status: DC
  Administered 2014-07-27 – 2014-08-02 (×5): 40 mg via ORAL
  Filled 2014-07-27 (×8): qty 1

## 2014-07-27 MED ORDER — MENTHOL 3 MG MT LOZG: 1.0000 | LOZENGE | OROMUCOSAL | Status: DC | PRN

## 2014-07-27 MED ORDER — VITAMIN B-6 100 MG PO TABS
100.0000 mg | ORAL_TABLET | Freq: Every day | ORAL | Status: DC
  Administered 2014-07-27 – 2014-07-30 (×3): 100 mg via ORAL
  Administered 2014-07-31: 20:00:00 via ORAL
  Administered 2014-08-01 – 2014-08-02 (×2): 100 mg via ORAL
  Filled 2014-07-27 (×8): qty 1

## 2014-07-27 MED ORDER — PHENYLEPHRINE 40 MCG/ML (10ML) SYRINGE FOR IV PUSH (FOR BLOOD PRESSURE SUPPORT)
PREFILLED_SYRINGE | INTRAVENOUS | Status: AC
  Filled 2014-07-27: qty 10

## 2014-07-27 MED ORDER — EPHEDRINE SULFATE 50 MG/ML IJ SOLN
INTRAMUSCULAR | Status: DC | PRN
  Administered 2014-07-27: 10 mg via INTRAVENOUS

## 2014-07-27 MED ORDER — ALUM & MAG HYDROXIDE-SIMETH 200-200-20 MG/5ML PO SUSP: 30.0000 mL | ORAL | Status: DC | PRN

## 2014-07-27 MED ORDER — HYDROMORPHONE HCL PF 1 MG/ML IJ SOLN
0.2500 mg | INTRAMUSCULAR | Status: DC | PRN
  Administered 2014-07-27 (×2): 0.25 mg via INTRAVENOUS

## 2014-07-27 MED ORDER — CEFAZOLIN SODIUM-DEXTROSE 2-3 GM-% IV SOLR
2.0000 g | INTRAVENOUS | Status: AC
  Administered 2014-07-27: 2 g via INTRAVENOUS
  Filled 2014-07-27: qty 50

## 2014-07-27 MED ORDER — METOCLOPRAMIDE HCL 5 MG/ML IJ SOLN: 5.0000 mg | Freq: Three times a day (TID) | INTRAMUSCULAR | Status: DC | PRN

## 2014-07-27 MED ORDER — HYDROMORPHONE HCL PF 1 MG/ML IJ SOLN
INTRAMUSCULAR | Status: AC
  Filled 2014-07-27: qty 1

## 2014-07-27 MED ORDER — METHOCARBAMOL 500 MG PO TABS
500.0000 mg | ORAL_TABLET | Freq: Four times a day (QID) | ORAL | Status: DC | PRN
  Administered 2014-07-30: 500 mg via ORAL
  Filled 2014-07-27 (×2): qty 1

## 2014-07-27 MED ORDER — ARTIFICIAL TEARS OP OINT
TOPICAL_OINTMENT | OPHTHALMIC | Status: AC
  Filled 2014-07-27: qty 3.5

## 2014-07-27 MED ORDER — ARTIFICIAL TEARS OP OINT
TOPICAL_OINTMENT | OPHTHALMIC | Status: DC | PRN
  Administered 2014-07-27: 1 via OPHTHALMIC

## 2014-07-27 MED ORDER — DOCUSATE SODIUM 100 MG PO CAPS
100.0000 mg | ORAL_CAPSULE | Freq: Two times a day (BID) | ORAL | Status: DC
  Administered 2014-07-27 – 2014-08-03 (×14): 100 mg via ORAL
  Filled 2014-07-27 (×16): qty 1

## 2014-07-27 MED ORDER — PROPOFOL 10 MG/ML IV BOLUS
INTRAVENOUS | Status: DC | PRN
  Administered 2014-07-27: 130 mg via INTRAVENOUS

## 2014-07-27 MED ORDER — MAGNESIUM CITRATE PO SOLN: 1.0000 | Freq: Once | ORAL | Status: AC | PRN

## 2014-07-27 MED ORDER — POTASSIUM CHLORIDE IN NACL 20-0.45 MEQ/L-% IV SOLN
INTRAVENOUS | Status: DC
  Administered 2014-07-27 – 2014-08-03 (×6): via INTRAVENOUS
  Filled 2014-07-27 (×14): qty 1000

## 2014-07-27 MED ORDER — LEVOCETIRIZINE DIHYDROCHLORIDE 5 MG PO TABS: 5.0000 mg | ORAL_TABLET | Freq: Every evening | ORAL | Status: DC

## 2014-07-27 MED ORDER — MONTELUKAST SODIUM 10 MG PO TABS
10.0000 mg | ORAL_TABLET | Freq: Every day | ORAL | Status: DC
  Administered 2014-07-27 – 2014-08-02 (×6): 10 mg via ORAL
  Filled 2014-07-27 (×8): qty 1

## 2014-07-27 MED ORDER — 0.9 % SODIUM CHLORIDE (POUR BTL) OPTIME
TOPICAL | Status: DC | PRN
  Administered 2014-07-27: 1000 mL

## 2014-07-27 MED ORDER — METOCLOPRAMIDE HCL 10 MG PO TABS: 5.0000 mg | ORAL_TABLET | Freq: Three times a day (TID) | ORAL | Status: DC | PRN

## 2014-07-27 MED ORDER — HYDROCODONE-ACETAMINOPHEN 10-325 MG PO TABS
1.0000 | ORAL_TABLET | ORAL | Status: DC | PRN
  Administered 2014-07-27: 2 via ORAL
  Administered 2014-07-27 (×2): 1 via ORAL
  Administered 2014-07-28 (×4): 2 via ORAL
  Administered 2014-07-29: 1 via ORAL
  Filled 2014-07-27 (×2): qty 2
  Filled 2014-07-27 (×2): qty 1
  Filled 2014-07-27: qty 2
  Filled 2014-07-27 (×2): qty 1
  Filled 2014-07-27 (×2): qty 2

## 2014-07-27 MED ORDER — ONDANSETRON HCL 4 MG/2ML IJ SOLN
4.0000 mg | Freq: Four times a day (QID) | INTRAMUSCULAR | Status: DC | PRN
  Administered 2014-07-28 – 2014-08-03 (×2): 4 mg via INTRAVENOUS
  Filled 2014-07-27 (×2): qty 2

## 2014-07-27 SURGICAL SUPPLY — 63 items
APL SKNCLS STERI-STRIP NONHPOA (GAUZE/BANDAGES/DRESSINGS) ×1
BANDAGE ELASTIC 6 VELCRO ST LF (GAUZE/BANDAGES/DRESSINGS) ×4 IMPLANT
BANDAGE ESMARK 6X9 LF (GAUZE/BANDAGES/DRESSINGS) ×1 IMPLANT
BENZOIN TINCTURE PRP APPL 2/3 (GAUZE/BANDAGES/DRESSINGS) ×3 IMPLANT
BLADE SAG 18X100X1.27 (BLADE) ×3 IMPLANT
BLADE SAW RECIP 87.9 MT (BLADE) ×3 IMPLANT
BLADE SAW SGTL 13X75X1.27 (BLADE) ×3 IMPLANT
BNDG CMPR 9X6 STRL LF SNTH (GAUZE/BANDAGES/DRESSINGS) ×1
BNDG ESMARK 6X9 LF (GAUZE/BANDAGES/DRESSINGS) ×3
BOOTCOVER CLEANROOM LRG (PROTECTIVE WEAR) ×2 IMPLANT
BOWL SMART MIX CTS (DISPOSABLE) ×3 IMPLANT
CAPT FB KNEE W/COCR XLINK ×2 IMPLANT
CEMENT HV SMART SET (Cement) ×6 IMPLANT
CLOSURE STERI-STRIP 1/2X4 (GAUZE/BANDAGES/DRESSINGS) ×1
CLSR STERI-STRIP ANTIMIC 1/2X4 (GAUZE/BANDAGES/DRESSINGS) ×2 IMPLANT
COVER SURGICAL LIGHT HANDLE (MISCELLANEOUS) ×3 IMPLANT
CUFF TOURNIQUET SINGLE 34IN LL (TOURNIQUET CUFF) ×2 IMPLANT
DRAPE EXTREMITY T 121X128X90 (DRAPE) ×3 IMPLANT
DRAPE PROXIMA HALF (DRAPES) ×6 IMPLANT
DRAPE U-SHAPE 47X51 STRL (DRAPES) ×3 IMPLANT
DURAPREP 26ML APPLICATOR (WOUND CARE) ×3 IMPLANT
ELECT CAUTERY BLADE 6.4 (BLADE) ×3 IMPLANT
ELECT REM PT RETURN 9FT ADLT (ELECTROSURGICAL) ×3
ELECTRODE REM PT RTRN 9FT ADLT (ELECTROSURGICAL) ×1 IMPLANT
FACESHIELD WRAPAROUND (MASK) ×3 IMPLANT
FACESHIELD WRAPAROUND OR TEAM (MASK) ×1 IMPLANT
GLOVE BIOGEL PI ORTHO PRO SZ8 (GLOVE) ×4
GLOVE ORTHO TXT STRL SZ7.5 (GLOVE) ×5 IMPLANT
GLOVE PI ORTHO PRO STRL SZ8 (GLOVE) ×2 IMPLANT
GLOVE SURG ORTHO 8.0 STRL STRW (GLOVE) ×3 IMPLANT
GOWN STRL REUS W/ TWL XL LVL3 (GOWN DISPOSABLE) ×1 IMPLANT
GOWN STRL REUS W/TWL 2XL LVL3 (GOWN DISPOSABLE) ×3 IMPLANT
GOWN STRL REUS W/TWL XL LVL3 (GOWN DISPOSABLE) ×9
HANDPIECE INTERPULSE COAX TIP (DISPOSABLE) ×3
HOOD PEEL AWAY FACE SHEILD DIS (HOOD) ×8 IMPLANT
IMMOBILIZER KNEE 22 (SOFTGOODS) ×3 IMPLANT
IMMOBILIZER KNEE 22 UNIV (SOFTGOODS) ×2 IMPLANT
KIT BASIN OR (CUSTOM PROCEDURE TRAY) ×3 IMPLANT
KIT ROOM TURNOVER OR (KITS) ×3 IMPLANT
MANIFOLD NEPTUNE II (INSTRUMENTS) ×3 IMPLANT
NS IRRIG 1000ML POUR BTL (IV SOLUTION) ×3 IMPLANT
PACK TOTAL JOINT (CUSTOM PROCEDURE TRAY) ×3 IMPLANT
PAD ABD 8X10 STRL (GAUZE/BANDAGES/DRESSINGS) ×3 IMPLANT
PAD ARMBOARD 7.5X6 YLW CONV (MISCELLANEOUS) ×4 IMPLANT
PAD CAST 4YDX4 CTTN HI CHSV (CAST SUPPLIES) ×1 IMPLANT
PADDING CAST COTTON 4X4 STRL (CAST SUPPLIES) ×3
PADDING CAST COTTON 6X4 STRL (CAST SUPPLIES) ×3 IMPLANT
SET HNDPC FAN SPRY TIP SCT (DISPOSABLE) ×1 IMPLANT
SPONGE GAUZE 4X4 12PLY (GAUZE/BANDAGES/DRESSINGS) ×3 IMPLANT
SPONGE GAUZE 4X4 12PLY STER LF (GAUZE/BANDAGES/DRESSINGS) ×2 IMPLANT
STAPLER VISISTAT 35W (STAPLE) ×3 IMPLANT
SUCTION FRAZIER TIP 10 FR DISP (SUCTIONS) ×3 IMPLANT
SUT MNCRL AB 4-0 PS2 18 (SUTURE) IMPLANT
SUT VIC AB 0 CT1 27 (SUTURE) ×3
SUT VIC AB 0 CT1 27XBRD ANBCTR (SUTURE) ×1 IMPLANT
SUT VIC AB 2-0 CT1 27 (SUTURE) ×3
SUT VIC AB 2-0 CT1 TAPERPNT 27 (SUTURE) ×1 IMPLANT
SUT VIC AB 3-0 SH 8-18 (SUTURE) ×6 IMPLANT
SYR 30ML LL (SYRINGE) ×3 IMPLANT
TOWEL OR 17X24 6PK STRL BLUE (TOWEL DISPOSABLE) ×3 IMPLANT
TOWEL OR 17X26 10 PK STRL BLUE (TOWEL DISPOSABLE) ×3 IMPLANT
TRAY FOLEY CATH 16FRSI W/METER (SET/KITS/TRAYS/PACK) IMPLANT
WATER STERILE IRR 1000ML POUR (IV SOLUTION) ×2 IMPLANT

## 2014-07-27 NOTE — Anesthesia Procedure Notes (Addendum)
Anesthesia Regional Block:  Femoral nerve block  Pre-Anesthetic Checklist: ,, timeout performed, Correct Patient, Correct Site, Correct Laterality, Correct Procedure, Correct Position, site marked, Risks and benefits discussed, Surgical consent,  Pre-op evaluation,  At surgeon's request  Laterality: Right  Prep: Maximum Sterile Barrier Precautions used, chloraprep and alcohol swabs       Needles:  Injection technique: Single-shot  Needle Type: Stimulator Needle - 80        Needle insertion depth: 4 cm   Additional Needles:  Procedures: nerve stimulator Femoral nerve block  Nerve Stimulator or Paresthesia:  Response: 0.5 mA, 0.1 ms, 4 cm  Additional Responses:   Narrative:  Start time: 07/27/2014 7:05 AM End time: 07/27/2014 7:10 AM Injection made incrementally with aspirations every 5 mL.  Performed by: Personally  Anesthesiologist: Sharolyn Douglas MD  Additional Notes: Pt accepts procedure w/ risks. 10cc 0.5% Marcaine w/ epi w/o difficulty or discomfort. GES   Procedure Name: LMA Insertion Date/Time: 07/27/2014 7:52 AM Performed by: Scheryl Darter Pre-anesthesia Checklist: Patient identified, Emergency Drugs available, Suction available, Patient being monitored and Timeout performed Patient Re-evaluated:Patient Re-evaluated prior to inductionOxygen Delivery Method: Circle system utilized Preoxygenation: Pre-oxygenation with 100% oxygen Intubation Type: IV induction Ventilation: Mask ventilation without difficulty LMA: LMA inserted LMA Size: 4.0 Number of attempts: 1 Placement Confirmation: ETT inserted through vocal cords under direct vision,  positive ETCO2 and breath sounds checked- equal and bilateral Tube secured with: Tape Dental Injury: Teeth and Oropharynx as per pre-operative assessment

## 2014-07-27 NOTE — Transfer of Care (Signed)
Immediate Anesthesia Transfer of Care Note  Patient: Christine Leblanc  Procedure(s) Performed: Procedure(s): RIGHT TOTAL KNEE ARTHROPLASTY (Right)  Patient Location: PACU  Anesthesia Type:General  Level of Consciousness: awake, alert , oriented and sedated  Airway & Oxygen Therapy: Patient Spontanous Breathing  Post-op Assessment: Report given to PACU RN, Post -op Vital signs reviewed and stable and Patient moving all extremities  Post vital signs: Reviewed and stable  Complications: No apparent anesthesia complications

## 2014-07-27 NOTE — Anesthesia Postprocedure Evaluation (Signed)
  Anesthesia Post-op Note  Patient: Christine Leblanc  Procedure(s) Performed: Procedure(s): RIGHT TOTAL KNEE ARTHROPLASTY (Right)  Patient Location: PACU  Anesthesia Type:GA combined with regional for post-op pain  Level of Consciousness: awake, oriented, sedated and patient cooperative  Airway and Oxygen Therapy: Patient Spontanous Breathing  Post-op Pain: none  Post-op Assessment: Post-op Vital signs reviewed, Patient's Cardiovascular Status Stable, Respiratory Function Stable, Patent Airway, No signs of Nausea or vomiting and Pain level controlled  Post-op Vital Signs: stable  Last Vitals:  Filed Vitals:   07/27/14 1145  BP: 158/78  Pulse: 74  Temp:   Resp: 10    Complications: No apparent anesthesia complications

## 2014-07-27 NOTE — Progress Notes (Signed)
Utilization review completed.  

## 2014-07-27 NOTE — Evaluation (Signed)
Physical Therapy Evaluation Patient Details Name: Christine Leblanc MRN: [id number] DOB: October 22, 1929 Today's Date: 07/27/2014   History of Present Illness  Pt admitted for R TKA.   Clinical Impression  Pt pleasant and moving well for POD#0. On arrival pt in bed with blanket under knee with knee and blanket wrapped in KI, removed blanket and KI and notified pt and RN of knee precautions with no resting in bent position. Assisted pt with straightening knee for KI placement for transfers and gait and after walking removed KI and further stretched knee to neutral extension in chair. Pt educated for basic exercises, transfers and gait. Pt will benefit from acute therapy to maximize ROM, strength, transfers and gait to return pt to PLOF.    Follow Up Recommendations SNF    Equipment Recommendations  None recommended by PT    Recommendations for Other Services       Precautions / Restrictions Precautions Precautions: Knee;Fall Required Braces or Orthoses: Knee Immobilizer - Right Knee Immobilizer - Right: On when out of bed or walking;Discontinue once straight leg raise with < 10 degree lag Restrictions Weight Bearing Restrictions: Yes RLE Weight Bearing: Weight bearing as tolerated      Mobility  Bed Mobility Overal bed mobility: Needs Assistance Bed Mobility: Supine to Sit     Supine to sit: Supervision     General bed mobility comments: cues for sequence with assist for lines  Transfers Overall transfer level: Needs assistance   Transfers: Sit to/from Stand;Stand Pivot Transfers Sit to Stand: Min assist Stand pivot transfers: Min assist       General transfer comment: cues for hand placement, posture and sequence with assist to elevate sacrum from surface  Ambulation/Gait Ambulation/Gait assistance: Min assist Ambulation Distance (Feet): 25 Feet Assistive device: Rolling walker (2 wheeled) Gait Pattern/deviations: Step-to pattern;Decreased stride length   Gait  velocity interpretation: Below normal speed for age/gender General Gait Details: max cues for sequence, posture and looking up with gait with assist to direct RW  Stairs            Wheelchair Mobility    Modified Rankin (Stroke Patients Only)       Balance Overall balance assessment: Needs assistance   Sitting balance-Leahy Scale: Good       Standing balance-Leahy Scale: Fair                               Pertinent Vitals/Pain 4/10 right knee, medicated and repositioned    Home Living Family/patient expects to be discharged to:: Private residence Living Arrangements: Children   Type of Home: House Home Access: Stairs to enter   Technical brewer of Steps: 1 Home Layout: Laundry or work area in basement;Able to live on main level with bedroom/bathroom Home Equipment: Environmental consultant - 2 wheels;Cane - single point;Bedside commode      Prior Function Level of Independence: Independent         Comments: Pt normally independent. 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 (housework and cooking mostly)     Journalist, newspaper        Extremity/Trunk Assessment   Upper Extremity Assessment: Overall WFL for tasks assessed           Lower Extremity Assessment: RLE deficits/detail RLE Deficits / Details: decreased ROM as expected post op    Cervical / Trunk Assessment: Normal  Communication   Communication: No difficulties  Cognition Arousal/Alertness: Awake/alert Behavior During  Therapy: WFL for tasks assessed/performed Overall Cognitive Status: Within Functional Limits for tasks assessed                      General Comments      Exercises Total Joint Exercises Ankle Circles/Pumps: AROM;Right;5 reps;Supine Quad Sets: AROM;Seated;Right;5 reps Heel Slides: AAROM;Seated;Right;5 reps      Assessment/Plan    PT Assessment Patient needs continued PT services  PT Diagnosis Difficulty walking   PT Problem List Decreased  strength;Decreased range of motion;Decreased knowledge of use of DME;Decreased activity tolerance;Decreased mobility;Pain;Decreased knowledge of precautions  PT Treatment Interventions DME instruction;Gait training;Functional mobility training;Therapeutic activities;Therapeutic exercise;Patient/family education   PT Goals (Current goals can be found in the Care Plan section) Acute Rehab PT Goals Patient Stated Goal: be able to walk PT Goal Formulation: With patient/family Time For Goal Achievement: 08/03/14 Potential to Achieve Goals: Good    Frequency 7X/week   Barriers to discharge Decreased caregiver support      Co-evaluation               End of Session Equipment Utilized During Treatment: Right knee immobilizer Activity Tolerance: Patient tolerated treatment well Patient left: in chair;with call bell/phone within reach;with nursing/sitter in room;with family/visitor present Nurse Communication: Mobility status;Precautions         Time: 7741-2878 PT Time Calculation (min): 24 min   Charges:   PT Evaluation $Initial PT Evaluation Tier I: 1 Procedure PT Treatments $Therapeutic Activity: 8-22 mins   PT G CodesMelford Aase 07/27/2014, 2:20 PM  Elwyn Reach, [place]

## 2014-07-27 NOTE — Op Note (Signed)
DATE OF SURGERY:  07/27/2014 TIME: 9:55 AM  PATIENT NAME:  Christine Leblanc   AGE: 80 y.o.    PRE-OPERATIVE DIAGNOSIS:  right knee osteoarthritis  POST-OPERATIVE DIAGNOSIS:  Same  PROCEDURE:  Procedure(s): RIGHT TOTAL KNEE ARTHROPLASTY   SURGEON:  Johnny Bridge, MD   ASSISTANT:  Joya Gaskins, OPA-C, present and scrubbed throughout the case, critical for assistance with exposure, retraction, instrumentation, and closure.  Second Assistant: HC Martensen, PA-Student  OPERATIVE IMPLANTS: Depuy PFC Sigma, Posterior Stabilized.  Femur size 4N, Tibia size 3, Patella size 38  3-peg oval button, with a 10 mm polyethylene insert.   PREOPERATIVE INDICATIONS:  Christine Leblanc is a 80 y.o. year old female with end stage bone on bone degenerative arthritis of the knee who failed conservative treatment, including injections, antiinflammatories, activity modification, and assistive devices, and had significant impairment of their activities of daily living, and elected for Total Knee Arthroplasty.   The risks, benefits, and alternatives were discussed at length including but not limited to the risks of infection, bleeding, nerve injury, stiffness, blood clots, the need for revision surgery, cardiopulmonary complications, among others, and they were willing to proceed.   OPERATIVE DESCRIPTION:  The patient was brought to the operative room and placed in a supine position.  General anesthesia was administered.  IV antibiotics were given.  The lower extremity was prepped and draped in the usual sterile fashion.  Time out was performed.  The leg was elevated and exsanguinated and the tourniquet was inflated.  Anterior quadriceps tendon splitting approach was performed.  The patella was everted and osteophytes were removed.  The anterior horn of the medial and lateral meniscus was removed.   The distal femur was opened with the drill and the intramedullary distal femoral cutting jig was utilized, set  at 5 degrees resecting 10 mm off the distal femur.  Care was taken to protect the collateral ligaments.  Then the extramedullary tibial cutting jig was utilized making the appropriate cut using the anterior tibial crest as a reference building in appropriate posterior slope.  Care was taken during the cut to protect the medial and collateral ligaments.  The proximal tibia was removed along with the posterior horns of the menisci.  The PCL was sacrificed.  The proximal tibia was extremely sclerotic, and I had to cut the medial side a second time because the saw was kicked up, and had to remove multiple loose bodies, with small pearls of bone measuring 2 cm x 1 cm, a total of 2 or even 3 of these.  The extensor gap was measured and was approximately 56mm.    The distal femoral sizing jig was applied, taking care to avoid notching.  Then the 4-in-1 cutting jig was applied and the anterior and posterior femur was cut, along with the chamfer cuts.  All posterior osteophytes were removed.  The flexion gap was then measured and was symmetric with the extension gap.  I completed the distal femoral preparation using the appropriate jig to prepare the box.  The patella was then measured, and cut with the saw. Initially, the patella measured 23 mm, but there was extreme wear on the patella with deep groove formation. After cutting the patella measured 12 mm, and the saw blade was apparently reflected by the sclerotic bone, resulting in a slightly deeper than planned resection. I had set the jig at 15 mm. Nonetheless this was a smooth cut, with adequate depth for implantation. During the preparation of the drill holes  I did not quite the drill completely, in order to prevent penetration to the far cortex.   The proximal tibia sized and prepared accordingly with the reamer and the punch, and then all components were trialed with the 69mm poly insert.  The knee was found to have excellent balance and full motion.     The above named components were then cemented into place and all excess cement was removed.  The real polyethylene implant was placed. I released the tourniquet prior to closure, and had adequate hemostasis without evidence for significant posterior vascular insult.  The knee was easily taken through a range of motion and the patella tracked well and the knee irrigated copiously and the parapatellar and subcutaneous tissue closed with vicryl, and monocryl with steri strips for the skin.  The wounds were injected with marcaine, and dressed with sterile gauze and the tourniquet released and the patient was awakened and returned to the PACU in stable and satisfactory condition.  There were no complications.  Total tourniquet time was 75 minutes.

## 2014-07-27 NOTE — H&P (Signed)
PREOPERATIVE H&P  Chief Complaint: djd right knee  HPI: Christine Leblanc is a 80 y.o. female who presents for preoperative history and physical with a diagnosis of djd right knee. Symptoms are rated as moderate to severe, and have been worsening.  This is significantly impairing activities of daily living.  She has elected for surgical management. Her pain is rated as 8/10 around the knee, noted progressive loss of motion, failed injections, anti-inflammatories, and ibuprofen.  Past Medical History  Diagnosis Date  . Syncope and collapse 2007    Possible CVA in 2012 with left lower extremity weakness; refused hospitalization; CT-Atrophy and chronic microvascular ischemic change.   . Asthma   . Gastroesophageal reflux disease     Hiatal hernia; distal esophageal web requiring dilatation; gastric polyps; gastritis; refuses colonoscopy  . Lymphocytic thyroiditis   . Multiple thyroid nodules 2010    Adenomatous; thyroidectomy in 2010  . Hyperlipidemia 04/27/2011  . Anemia   . GERD (gastroesophageal reflux disease)   . Chronic neck pain   . Chronic back pain   . Pneumonia     hosp. for pneumonia- long time ago   . Hypothyroidism   . Seizures     yes- as a child- & into adult years, states she took med. for them at one time, stopped at 32 yrs. of age   . Degenerative joint disease     Left shoulder; cervical spine, knees & hands   . Cancer     skin Ca- ? basal cell   . History of stress test 1990's    stress test done under the care of Dr. Lattie Haw & Dr. Gwenlyn Found, now being followed by Dr. Harl Bowie- in [place] , recently seen & told to f/U in one yr.   . Hypertension    Past Surgical History  Procedure Laterality Date  . Abdominal hysterectomy      fibroids  . Prolapsed uterine fibroid ligation      outcomed with rectocele & cystocele  . Total thyroidectomy  2010  . Esophagogastroduodenoscopy  12/10    small hiatal ernia/gastric polyps/mild gastritis  . Cataract extraction     Bilateral; redo surgery on the right for incomplete primary procedure  . Colonoscopy  Remote  . Shoulder surgery      left   History   Social History  . Marital Status: Widowed    Spouse Name: N/A    Number of Children: 4  . Years of Education: N/A   Occupational History  . 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 Retired     Marine scientist   Social History Main Topics  . Smoking status: Former Smoker -- 0.80 packs/day for 20 years    Types: Cigarettes    Quit date: 12/31/1978  . Smokeless tobacco: Never Used  . Alcohol Use: No  . Drug Use: No  . Sexual Activity: Not on file   Other Topics Concern  . Not on file   Social History Narrative  . No narrative on file   Family History  Problem Relation Age of Onset  . Heart disease Mother   . Gallbladder disease Mother   . Breast cancer Sister   . Colon cancer Neg Hx    Allergies  Allergen Reactions  . Celecoxib Shortness Of Breath   Prior to Admission medications   Medication Sig Start Date End Date Taking? Authorizing Provider  albuterol (PROVENTIL HFA) 108 (90 BASE) MCG/ACT inhaler Inhale 2 puffs into the lungs  every 6 (six) hours as needed. Wheezing, Asthma Symptoms   Yes Historical Provider, MD  aspirin EC 81 MG tablet Take 81 mg by mouth daily before breakfast.    Yes Historical Provider, MD  calcium-vitamin D (OSCAL WITH D) 500-200 MG-UNIT per tablet Take 1 tablet by mouth 2 (two) times daily.    Yes Historical Provider, MD  dexlansoprazole (DEXILANT) 60 MG capsule Take 60 mg by mouth daily before breakfast.    Yes Historical Provider, MD  diazepam (VALIUM) 5 MG tablet Take 5 mg by mouth daily as needed. Sleep   Yes Historical Provider, MD  etodolac (LODINE) 300 MG capsule Take 300 mg by mouth 2 (two) times daily.   Yes Historical Provider, MD  Glucosamine-Chondroit-Vit C-Mn (GLUCOSAMINE CHONDR 1500 COMPLX PO) Take 1 tablet by mouth 2 (two) times daily. INCLUDES: Chondroitin 1200mg  (1.2 g)   Yes Historical Provider, MD   levocetirizine (XYZAL) 5 MG tablet Take 5 mg by mouth every evening.     Yes Historical Provider, MD  levothyroxine (SYNTHROID, LEVOTHROID) 100 MCG tablet Take 100 mcg by mouth daily before breakfast. 04/22/14  Yes Elayne Snare, MD  montelukast (SINGULAIR) 10 MG tablet Take 10 mg by mouth at bedtime.   Yes Historical Provider, MD  Omega-3 Fatty Acids (FISH OIL) 1200 MG CAPS Take 1 capsule by mouth 2 (two) times daily.    Yes Historical Provider, MD  pravastatin (PRAVACHOL) 80 MG tablet Take 80 mg by mouth daily.   Yes Historical Provider, MD  pyridOXINE (VITAMIN B-6) 100 MG tablet Take 100 mg by mouth at bedtime.   Yes Historical Provider, MD  Sennosides-Docusate Sodium (PERI-COLACE PO) Take 1 tablet by mouth 2 (two) times daily.    Yes Historical Provider, MD  vitamin B-12 (CYANOCOBALAMIN) 1000 MCG tablet Take 1,000 mcg by mouth every morning.   Yes Historical Provider, MD  vitamin E 400 UNIT capsule Take 400 Units by mouth at bedtime.    Yes Historical Provider, MD     Positive ROS: All other systems have been reviewed and were otherwise negative with the exception of those mentioned in the HPI and as above.  Physical Exam: General: Alert, no acute distress Cardiovascular: No pedal edema Respiratory: No cyanosis, no use of accessory musculature GI: No organomegaly, abdomen is soft and non-tender Skin: No lesions in the area of chief complaint Neurologic: Sensation intact distally Psychiatric: Patient is competent for consent with normal mood and affect Lymphatic: No axillary or cervical lymphadenopathy  MUSCULOSKELETAL: Right knee has varus alignment with range of motion from 10 to 110.  X-rays demonstrate end-stage degenerative changes.  Assessment: djd right knee  Plan: Plan for Procedure(s): RIGHT TOTAL KNEE ARTHROPLASTY  The risks benefits and alternatives were discussed with the patient including but not limited to the risks of nonoperative treatment, versus surgical  intervention including infection, bleeding, nerve injury,  blood clots, cardiopulmonary complications, morbidity, mortality, among others, and they were willing to proceed.   Johnny Bridge, MD Cell [phone]   07/27/2014 6:59 AM

## 2014-07-27 NOTE — Discharge Instructions (Signed)
Diet: As you were doing prior to hospitalization  ° °Shower:  May shower but keep the wounds dry, use an occlusive plastic wrap, NO SOAKING IN TUB.  If the bandage gets wet, change with a clean dry gauze. ° °Dressing:  You may change your dressing 3-5 days after surgery.  Then change the dressing daily with sterile gauze dressing.   ° °There are sticky tapes (steri-strips) on your wounds and all the stitches are absorbable.  Leave the steri-strips in place when changing your dressings, they will peel off with time, usually 2-3 weeks. ° °Activity:  Increase activity slowly as tolerated, but follow the weight bearing instructions below.  No lifting or driving for 6 weeks. ° °Weight Bearing:   As tolerated.   ° °To prevent constipation: you may use a stool softener such as - ° °Colace (over the counter) 100 mg by mouth twice a day  °Drink plenty of fluids (prune juice may be helpful) and high fiber foods °Miralax (over the counter) for constipation as needed.   ° °Itching:  If you experience itching with your medications, try taking only a single pain pill, or even half a pain pill at a time.  You may take up to 10 pain pills per day, and you can also use benadryl over the counter for itching or also to help with sleep.  ° °Precautions:  If you experience chest pain or shortness of breath - call 911 immediately for transfer to the hospital emergency department!! ° °If you develop a fever greater that 101 F, purulent drainage from wound, increased redness or drainage from wound, or calf pain -- Call the office at [phone]                                                °Follow- Up Appointment:  Please call for an appointment to be seen in 2 weeks [place] - [phone] ° ° ° ° ° °

## 2014-07-27 NOTE — Anesthesia Preprocedure Evaluation (Addendum)
Anesthesia Evaluation  Patient identified by MRN, date of birth, ID band Patient awake    Reviewed: Allergy & Precautions, H&P , NPO status , Patient's Chart, lab work & pertinent test results  Airway Mallampati: I      Dental  (+) Edentulous Upper, Edentulous Lower, Dental Advidsory Given   Pulmonary asthma , pneumonia -, resolved, former smoker,  breath sounds clear to auscultation        Cardiovascular hypertension, Rhythm:regular     Neuro/Psych Seizures -, Well Controlled,     GI/Hepatic GERD-  ,  Endo/Other  Hypothyroidism   Renal/GU      Musculoskeletal   Abdominal   Peds  Hematology  (+) anemia ,   Anesthesia Other Findings   Reproductive/Obstetrics                         Anesthesia Physical Anesthesia Plan  ASA: III  Anesthesia Plan: General LMA and General ETT   Post-op Pain Management: MAC Combined w/ Regional for Post-op pain   Induction: Intravenous  Airway Management Planned: Oral ETT  Additional Equipment:   Intra-op Plan:   Post-operative Plan: Extubation in OR  Informed Consent: I have reviewed the patients History and Physical, chart, labs and discussed the procedure including the risks, benefits and alternatives for the proposed anesthesia with the patient or authorized representative who has indicated his/her understanding and acceptance.   Dental Advisory Given  Plan Discussed with: Anesthesiologist, CRNA and Surgeon  Anesthesia Plan Comments:        Anesthesia Quick Evaluation

## 2014-07-27 NOTE — Progress Notes (Signed)
Report given to [employer] as caregiver

## 2014-07-28 ENCOUNTER — Encounter (HOSPITAL_COMMUNITY): Payer: Self-pay | Admitting: Orthopedic Surgery

## 2014-07-28 LAB — CBC
HCT: 25.8 % — ABNORMAL LOW (ref 36.0–46.0)
Hemoglobin: 8.6 g/dL — ABNORMAL LOW (ref 12.0–15.0)
MCH: 29.8 pg (ref 26.0–34.0)
MCHC: 33.3 g/dL (ref 30.0–36.0)
MCV: 89.3 fL (ref 78.0–100.0)
Platelets: 134 10*3/uL — ABNORMAL LOW (ref 150–400)
RBC: 2.89 MIL/uL — AB (ref 3.87–5.11)
RDW: 13.6 % (ref 11.5–15.5)
WBC: 11.1 10*3/uL — ABNORMAL HIGH (ref 4.0–10.5)

## 2014-07-28 LAB — BASIC METABOLIC PANEL
ANION GAP: 15 (ref 5–15)
BUN: 22 mg/dL (ref 6–23)
CO2: 25 mEq/L (ref 19–32)
Calcium: 8.9 mg/dL (ref 8.4–10.5)
Chloride: 100 mEq/L (ref 96–112)
Creatinine, Ser: 0.94 mg/dL (ref 0.50–1.10)
GFR calc non Af Amer: 55 mL/min — ABNORMAL LOW (ref >90)
GFR, EST AFRICAN AMERICAN: 63 mL/min — AB (ref >90)
Glucose, Bld: 161 mg/dL — ABNORMAL HIGH (ref 70–99)
POTASSIUM: 4.4 meq/L (ref 3.7–5.3)
SODIUM: 140 meq/L (ref 137–147)

## 2014-07-28 NOTE — Progress Notes (Signed)
Seen and agreed 07/28/2014 Jacqualyn Posey PTA [phone] pager [phone] office

## 2014-07-28 NOTE — Progress Notes (Signed)
Patient ID: SHENITA TREGO, female   DOB: Aug 27, 1929, 80 y.o.   MRN: [id number]     Subjective:  Patient reports pain as mild.  Patient sitting up in bed and having breakfast. Denies any CP or SOB.  Objective:   VITALS:   Filed Vitals:   07/28/14 0000 07/28/14 0105 07/28/14 0400 07/28/14 0555  BP:  123/59  101/62  Pulse:  85  82  Temp:  98 F (36.7 C)  97.4 F (36.3 C)  TempSrc:      Resp: 18 18 18 16   Height:      Weight:      SpO2: 98% 98% 97% 97%    ABD soft Sensation intact distally Dorsiflexion/Plantar flexion intact Incision: dressing C/D/I and no drainage Moving ankle and foot in all planes EHL FHL firing  Lab Results  Component Value Date   WBC 11.1* 07/28/2014   HGB 8.6* 07/28/2014   HCT 25.8* 07/28/2014   MCV 89.3 07/28/2014   PLT 134* 07/28/2014     Assessment/Plan: 1 Day Post-Op   Principal Problem:   Osteoarthritis of right knee Active Problems:   Knee osteoarthritis   Advance diet Up with therapy WBAT Dry dressing prn  ABLA continue to monitor Plan for SNF Thursday or Friday   Remonia Richter 07/28/2014, 7:32 AM  Discussed and agree with above.    Marchia Bond, MD Cell [phone]

## 2014-07-28 NOTE — Clinical Social Work Psychosocial (Signed)
Clinical Social Work Department BRIEF PSYCHOSOCIAL ASSESSMENT 07/28/2014  Patient:  Christine Leblanc, Christine Leblanc     Account Number:  [id number]     Admit date:  07/27/2014  Clinical Social Worker:  Lovey Newcomer  Date/Time:  07/28/2014 02:31 PM  Referred by:  Physician  Date Referred:  07/28/2014 Referred for  SNF Placement   Other Referral:   Interview type:  Patient Other interview type:   Patient alert and oriented at time of assessment.    PSYCHOSOCIAL DATA Living Status:  FAMILY Admitted from facility:   Level of care:   Primary support name:  Maudie Mercury and Cecille Rubin Primary support relationship to patient:  CHILD, ADULT Degree of support available:   SUpport is good.    CURRENT CONCERNS Current Concerns  Post-Acute Placement   Other Concerns:    SOCIAL WORK ASSESSMENT / PLAN CSW met with patient at bedside to complete assessment. Patient states that she lives at home her daughter who has special needs also lives with her. Patient states that she currently has someone taking care of her daughter while she is recovering. Patient states that she plans to go to [place] at discharge. CSW explained SNF search/placement process and answered patient's questions. Patient seems indifferent about placement and states that she is not surprised that she needs it. Patient was accompanied by her sister, brother, and brother in law at bedside. CSW will follow up with bed offers.   Assessment/plan status:  Psychosocial Support/Ongoing Assessment of Needs Other assessment/ plan:   Complete Fl2, Fax, PASRR   Information/referral to community resources:   CSW contact information and SNF list given    PATIENT'S/FAMILY'S RESPONSE TO PLAN OF CARE: Patient plans to Dc to SNF when medically stable. CSW will assist as necessary.       Liz Beach MSW, [place], [place], [phone]

## 2014-07-28 NOTE — Progress Notes (Signed)
Seen and agreed 07/28/2014 Jacqualyn Posey PTA [phone] pager [phone] office

## 2014-07-28 NOTE — Progress Notes (Signed)
OT Cancellation Note  Patient Details Name: Christine Leblanc MRN: [id number] DOB: [birth year]   Cancelled Treatment:    Reason Eval/Treat Not Completed: OT screened, pt with plans to discharge to SNF tomorrow for further rehab prior to return home.  Will defer OT to SNF. If d/c plan changes, please notify the OT department at [phone].  Thanks.  Malka So 07/28/2014, 9:01 AM [phone]

## 2014-07-28 NOTE — Progress Notes (Signed)
Patient's daughter Merlene Pulling) states that she is concerned that her mother is "not herself" because she did not take her synthroid the day of surgery.  Daughter asked when the MD/PA rounds; daughter informed they generally round early in the morning.  Daughter became upset because she thought MD/PA would come to see mother this afternoon or evening.  During assessment, patient indicated that her pain was minor; patient unable to rate pain at that time.  Patient was not sure if she wanted pain medicine at the time.  Patient is alert and oriented to person, place and time but slow to answer.  Patient also appears drowsy.  Patient and daughter informed that I would come back in 15 to 30 minutes to reassess patients pain.    Upon reassessment, daughter states that patient needs 2 pain pills.  Daughter educated about the distribution of pain medication.  Daughter informed that patient must either indicate the need for pain medication or the nurse can assess pain based on other non-verbal factors such as facial grimacing, moaning, fidgeting and etc.  Daughter educated that pain medication is given on an as needed basis and the amount given is dependent upon pain severity.  Charge RN consulted to provide a second opinion prior to administering pain medication.  Upon reassessment, patient indicated pain rating was an 8/10.  Patient's oxygen saturation on room air was 79%; 2L nasal cannula applied and oxygen saturation rose to 92%.  Pain medication provided at 2030 (see MAR).  Patient and daughter educated on the effects of too much pain medication.  Patient and daughter re-educated by charge RN on the process of administering pain medication and the side effects of pain medication.  Patient's daughter asked to be contacted by the MD/PA in the morning following rounds because she feels that her mother "should have 2 pain pills every 4 hours."  Sticky note applied to front of patient's electronic medical record  informing MD/PA of this request.  Will continue to monitor patient closely, will maintain continuous pulse oximetry and oxygen throughout the shift as needed.

## 2014-07-28 NOTE — Care Management Note (Signed)
CARE MANAGEMENT NOTE 07/28/2014  Patient:  Christine Leblanc, Christine Leblanc   Account Number:  [id number]  Date Initiated:  07/28/2014  Documentation initiated by:  Ricki Miller  Subjective/Objective Assessment:   80 yr old female s/p right total knee arthroplasty.     Action/Plan:   Patient is for shortterm rehab. Social worker is aware. Patient wants to go to [facility].   Anticipated DC Date:  07/29/2014   Anticipated DC Plan:  SKILLED NURSING FACILITY  In-house referral  Clinical Social Worker      DC Planning Services  CM consult      Choice offered to / List presented to:             Status of service:  Completed, signed off Medicare Important Message given?  YES (If response is "NO", the following Medicare IM given date fields will be blank) Date Medicare IM given:  07/28/2014 Medicare IM given by:  Ricki Miller Date Additional Medicare IM given:   Additional Medicare IM given by:    Discharge Disposition:  [place]  Per UR Regulation:  Reviewed for med. necessity/level of care/duration of stay  If discussed at [place] of Stay Meetings, dates discussed:    Comments:

## 2014-07-28 NOTE — Progress Notes (Signed)
Physical Therapy Treatment Patient Details Name: DANILYN COCKE MRN: [id number] DOB: Jul 23, 1929 Today's Date: 07/28/2014    History of Present Illness Pt admitted for R TKA.     PT Comments    Session cut short due to nausea and dizziness. Pts face turned pale when she came to standing and tried to amb, RN notified. Pt could not continue with exercises due to nausea and lethargy. Pt was having trouble staying awake anytime she was seated. Continue to recommend SNF for ongoing Physical Therapy.     Follow Up Recommendations  SNF     Equipment Recommendations  None recommended by PT    Recommendations for Other Services       Precautions / Restrictions Precautions Precautions: Knee;Fall Restrictions RLE Weight Bearing: Weight bearing as tolerated    Mobility  Bed Mobility               General bed mobility comments: pt in recliner before and after session  Transfers Overall transfer level: Needs assistance Equipment used: Rolling walker (2 wheeled) Transfers: Sit to/from [facility] Sit to Stand: Min assist Stand pivot transfers: Mod assist       General transfer comment: Pt needs min assist for safety and because pt can not get balance due to a posterior lean putting all her weight on her heels and bending both knees.   Ambulation/Gait Ambulation/Gait assistance: Mod assist Ambulation Distance (Feet): 2 Feet Assistive device: Rolling walker (2 wheeled) Gait Pattern/deviations: Step-to pattern Gait velocity: decreased   General Gait Details: pt was not able to amb far due to posterior leaning, and side leaning putting all of her weight on me. Pt was not able to operate legs correctly to take steps forward or keep her balance.    Stairs            Wheelchair Mobility    Modified Rankin (Stroke Patients Only)       Balance                                    Cognition Arousal/Alertness: Awake/alert Behavior  During Therapy: WFL for tasks assessed/performed Overall Cognitive Status: Within Functional Limits for tasks assessed                      Exercises      General Comments        Pertinent Vitals/Pain no apparent distress. Pt repositioned in recliner for comfort.      Home Living                      Prior Function            PT Goals (current goals can now be found in the care plan section) Progress towards PT goals: Progressing toward goals    Frequency  7X/week    PT Plan      Co-evaluation             End of Session Equipment Utilized During Treatment: Gait belt Activity Tolerance: Patient limited by fatigue;Patient limited by lethargy Patient left: in chair;with family/visitor present;with call bell/phone within reach     Time: 1012-1048 PT Time Calculation (min): 36 min  Charges:                       G Codes:      BRASFIELD,Tully Mcinturff,SPTA 07/28/2014, 12:07  PM   

## 2014-07-28 NOTE — Progress Notes (Signed)
Physical Therapy Treatment Patient Details Name: Christine Leblanc MRN: [id number] DOB: May 03, 1929 Today's Date: 07/28/2014    History of Present Illness Pt admitted for R TKA.     PT Comments    Pt is very confused and a little disoriented. Pt did not remember AM session. Pt was able to transfer to bed side commode and then amb a little further this session without posterior leaning. Pt fatigued very easily and requires mod assist when she sits to control descend.   Follow Up Recommendations  SNF     Equipment Recommendations  None recommended by PT    Recommendations for Other Services       Precautions / Restrictions Precautions Precautions: Knee;Fall Knee Immobilizer - Right: On when out of bed or walking;Discontinue once straight leg raise with < 10 degree lag Restrictions RLE Weight Bearing: Weight bearing as tolerated    Mobility  Bed Mobility               General bed mobility comments: pt in recliner before and after session  Transfers Overall transfer level: Needs assistance Equipment used: Rolling walker (2 wheeled) Transfers: Sit to/from [facility] Sit to Stand: Min guard Stand pivot transfers: Min assist       General transfer comment: Pt needs mod assist for stand pivot transfer and continuous instruction. Min assist when coming to standing for safety and balance.  Ambulation/Gait Ambulation/Gait assistance: Min assist Ambulation Distance (Feet): 6 Feet Assistive device: Rolling walker (2 wheeled) Gait Pattern/deviations: Step-to pattern Gait velocity: very decreased Gait velocity interpretation: Below normal speed for age/gender General Gait Details: Pt did much better with not putting weight through heels causing a posterior lean. Needs continuous instruction on gait sequence. Pt limited by fatigue.   Stairs            Wheelchair Mobility    Modified Rankin (Stroke Patients Only)       Balance                                    Cognition Arousal/Alertness: Awake/alert Behavior During Therapy: WFL for tasks assessed/performed Overall Cognitive Status: Within Functional Limits for tasks assessed                      Exercises      General Comments        Pertinent Vitals/Pain no apparent distress. Pt repositioned in recliner for comfort.     Home Living                      Prior Function            PT Goals (current goals can now be found in the care plan section) Progress towards PT goals: Progressing toward goals    Frequency  7X/week    PT Plan      Co-evaluation             End of Session Equipment Utilized During Treatment: Gait belt Activity Tolerance: Patient tolerated treatment well;Patient limited by fatigue Patient left: in chair;with family/visitor present;with call bell/phone within reach     Time: 1432-1500 PT Time Calculation (min): 28 min  Charges:  $Gait Training: 8-22 mins $Therapeutic Activity: 8-22 mins                    G Codes:      BRASFIELD,Maysen Sudol,SPTA  07/28/2014, 3:07 PM

## 2014-07-28 NOTE — Plan of Care (Signed)
Problem: Consults Goal: Diagnosis- Total Joint Replacement Outcome: Completed/Met Date Met:  07/28/14 Primary Total Knee Right

## 2014-07-29 ENCOUNTER — Inpatient Hospital Stay (HOSPITAL_COMMUNITY): Payer: Medicare Other

## 2014-07-29 DIAGNOSIS — D62 Acute posthemorrhagic anemia: Secondary | ICD-10-CM | POA: Diagnosis not present

## 2014-07-29 DIAGNOSIS — J9811 Atelectasis: Secondary | ICD-10-CM | POA: Diagnosis not present

## 2014-07-29 DIAGNOSIS — T50905A Adverse effect of unspecified drugs, medicaments and biological substances, initial encounter: Secondary | ICD-10-CM | POA: Diagnosis not present

## 2014-07-29 DIAGNOSIS — R410 Disorientation, unspecified: Secondary | ICD-10-CM | POA: Diagnosis not present

## 2014-07-29 DIAGNOSIS — F19921 Other psychoactive substance use, unspecified with intoxication with delirium: Secondary | ICD-10-CM

## 2014-07-29 LAB — CBC
HCT: 21.7 % — ABNORMAL LOW (ref 36.0–46.0)
Hemoglobin: 7.3 g/dL — ABNORMAL LOW (ref 12.0–15.0)
MCH: 30.8 pg (ref 26.0–34.0)
MCHC: 33.6 g/dL (ref 30.0–36.0)
MCV: 91.6 fL (ref 78.0–100.0)
Platelets: 145 10*3/uL — ABNORMAL LOW (ref 150–400)
RBC: 2.37 MIL/uL — ABNORMAL LOW (ref 3.87–5.11)
RDW: 13.9 % (ref 11.5–15.5)
WBC: 13.3 10*3/uL — AB (ref 4.0–10.5)

## 2014-07-29 LAB — PREPARE RBC (CROSSMATCH)

## 2014-07-29 MED ORDER — FUROSEMIDE 10 MG/ML IJ SOLN
20.0000 mg | Freq: Once | INTRAMUSCULAR | Status: AC
  Administered 2014-07-30: 20 mg via INTRAVENOUS
  Filled 2014-07-29: qty 2

## 2014-07-29 MED ORDER — NALOXONE HCL 0.4 MG/ML IJ SOLN
INTRAMUSCULAR | Status: AC
  Administered 2014-07-29: 0.4 mg
  Filled 2014-07-29: qty 1

## 2014-07-29 MED ORDER — FUROSEMIDE 10 MG/ML IJ SOLN
20.0000 mg | Freq: Once | INTRAMUSCULAR | Status: AC
  Administered 2014-07-29: 20 mg via INTRAVENOUS

## 2014-07-29 MED ORDER — FUROSEMIDE 10 MG/ML IJ SOLN
20.0000 mg | Freq: Once | INTRAMUSCULAR | Status: AC
  Administered 2014-07-29: 20 mg via INTRAVENOUS
  Filled 2014-07-29: qty 2

## 2014-07-29 MED ORDER — FUROSEMIDE 10 MG/ML IJ SOLN
INTRAMUSCULAR | Status: AC
  Filled 2014-07-29: qty 2

## 2014-07-29 NOTE — Progress Notes (Signed)
Patient unable to take medication at scheduled time because she was alert but too drowsy to swallow.  No complaints of pain at this time.  Returned to give patient medication, patient still drowsy.  Patient has non-productive cough, congestion and secretions.  Daughter states that she did not have cough on day of surgery.  Agricultural consultant and rapid response RN consulted.  Suction equipment set-up, patient suctioned and 0.4 mg of IV narcan given at [place].  Incentive spirometer as well as coughing and deep breathing encouraged.  Patient and daughter re-educated by RN and rapid response RN on the side effects of pain medicine.  Both patient and daughter acknowledged their understanding.  Oxygen saturation is 93% on 2L.  Will continue to monitor patient.

## 2014-07-29 NOTE — Progress Notes (Signed)
Seen and agreed 07/29/2014 Jacqualyn Posey PTA [phone] pager [phone] office

## 2014-07-29 NOTE — Significant Event (Signed)
Rapid Response Event Note  Overview:Called for Respiratory distress Time Called: 1610 Arrival Time: 1613 Event Type: Respiratory  Initial Focused Assessment:  Called by primary Rn for respiratory distress, sats on ra in the 80's placed on 2 LPM nasal cannula sats now 91-92%.  Upon arrival to patients room, RT and family at bedside.  Patient sitting in chair on oxygen, no accessory muscle use or increased WOB noted. Patient states she feels a little SOB, RR 25, 124/88.  As per RN, patient was receiving a unit of blood which just finished at 1600, patient needed to use the bathroom and at that time patient became SOB with audible wheezing, and course crackles.  Oxygen was increased to 4 LPM with sats at 95%.     Interventions:  Albuterol treatment given by RT prior to my arrival. [facility] ordered, MD paged and updated.  20 mg IV lasix ordered and given.  Sats 93% on 2 lpm, RR 22, patient states she feels a little better.   Event Summary:  Rn to call if assistance needed   at      at          [facility], [place]lin Rain

## 2014-07-29 NOTE — Evaluation (Signed)
Clinical/Bedside Swallow Evaluation Patient Details  Name: Christine Leblanc MRN: [id number] Date of Birth: 04/19/1929  Today's Date: 07/29/2014 Time: 1140-1227 SLP Time Calculation (min): 26 min  Past Medical History:  Past Medical History  Diagnosis Date  . Syncope and collapse 2007    Possible CVA in 2012 with left lower extremity weakness; refused hospitalization; CT-Atrophy and chronic microvascular ischemic change.   . Asthma   . Gastroesophageal reflux disease     Hiatal hernia; distal esophageal web requiring dilatation; gastric polyps; gastritis; refuses colonoscopy  . Lymphocytic thyroiditis   . Multiple thyroid nodules 2010    Adenomatous; thyroidectomy in 2010  . Hyperlipidemia 04/27/2011  . Anemia   . GERD (gastroesophageal reflux disease)   . Chronic neck pain   . Chronic back pain   . Pneumonia     hosp. for pneumonia- long time ago   . Hypothyroidism   . Seizures     yes- as a child- & into adult years, states she took med. for them at one time, stopped at 32 yrs. of age   . Degenerative joint disease     Left shoulder; cervical spine, knees & hands   . Cancer     skin Ca- ? basal cell   . History of stress test 1990's    stress test done under the care of Dr. Lattie Haw & Dr. Gwenlyn Found, now being followed by Dr. Harl Bowie- in [place] , recently seen & told to f/U in one yr.   . Hypertension   . Osteoarthritis of right knee 07/27/2014   Past Surgical History:  Past Surgical History  Procedure Laterality Date  . Abdominal hysterectomy      fibroids  . Prolapsed uterine fibroid ligation      outcomed with rectocele & cystocele  . Total thyroidectomy  2010  . Esophagogastroduodenoscopy  12/10    small hiatal ernia/gastric polyps/mild gastritis  . Cataract extraction      Bilateral; redo surgery on the right for incomplete primary procedure  . Colonoscopy  Remote  . Shoulder surgery      left  . Total knee arthroplasty Right 07/27/2014    Procedure: RIGHT TOTAL  KNEE ARTHROPLASTY;  Surgeon: Johnny Bridge, MD;  Location: [facility];  Service: Orthopedics;  Laterality: Right;   HPI:  80 year old female admitted 07/27/14 for right knee surgery. PMH significant for GERD, hiatal hernia, gastritis, esophageal dilation ~5 years ago per pt. BSE requested due to concern about pt report of food "not going down"   Assessment / Plan / Recommendation Clinical Impression  Pt seated in chair, awake and alert. Oral care completed, with placement of upper dentures. Oral motor strength and function appear adequate. No overt s/s aspiration observed with consistencies tested. Pt reports her last esophageal dilation was about 5 years ago, and her presentation raises suspicion for esophageal issues rather than oropharyngeal.  Recommend changing diet to Dys 2 (finely chopped, due to poor dentition - no lower molars) with thin liquids, meds as tolerated (with liquid or puree per pt preference). Also recommend consideration of  Regular Barium Swallow to evaluate esophageal motility, given history of GERD, gastritis, and hiatal hernia, as well as advanced age. Pt also reports increasing hoarseness, which could be sequela of recent intubation, possible reflux, etc. Further evaluation is warranted (ENT consult?) if not resolved within several weeks of surgery.  ST will follow briefly to assess diet tolerance and continue education as needed.  During evaluation, pt appears slightly confused, easily  distracted, and repetitious (told me several times the same information about her daughter for whom she cares having seizures). Further assessment of cognitive function is recommended to determine if pt is safe to be alone at home, acting as primary caregiver of her daughter who has seizures.    Aspiration Risk  Moderate    Diet Recommendation Dysphagia 2 (Fine chop);Thin liquid   Liquid Administration via: Straw;Cup Medication Administration: Other (Comment) (whole with puree or liquid, per pt  preference depending on the medication/ tablet size.) Supervision: Patient able to self feed Compensations: Slow rate;Small sips/bites;Follow solids with liquid (begin meal with warm beverage) Postural Changes and/or Swallow Maneuvers: Seated upright 90 degrees;Upright 30-60 min after meal    Other  Recommendations Oral Care Recommendations: Oral care BID Other Recommendations: Clarify dietary restrictions   Follow Up Recommendations  24 hour supervision/assistance    Frequency and Duration min 1 x/week  1 week   Pertinent Vitals/Pain VSS, pain 4-5/10 right knee, surgical pain    SLP Swallow Goals  diet tolerance, adherence to precautions   Swallow Study Prior Functional Status   Increasing hoarseness recently, history of esophageal dysmotility, GERD, gastritis, hiatal hernia.  No history of oropharyngeal dysphagia. Pt tolerating soft diet and thin liquids prior to admit, due to poor dentition (no lower molars).    General Date of Onset: 07/27/14 HPI: 80 year old female admitted 07/27/14 for right knee surgery. PMH significant for GERD, hiatal hernia, gastritis, esophageal dilation ~5 years ago per pt. BSE requested due to concern about pt report of food "not going down" Type of Study: Bedside swallow evaluation Previous Swallow Assessment: none found Diet Prior to this Study: Dysphagia 3 (soft);Thin liquids Temperature Spikes Noted: No Respiratory Status: Nasal cannula History of Recent Intubation: Yes Length of Intubations (days): 1 days (during surgery only) Date extubated: 07/27/14 Behavior/Cognition: Alert;Cooperative;Pleasant mood;Confused Oral Cavity - Dentition: Dentures, top;Missing dentition;Dentures, bottom Self-Feeding Abilities: Able to feed self with adaptive devices;Needs set up Patient Positioning: Upright in chair Baseline Vocal Quality:  (Pt reports worsening hoarseness recently) Volitional Cough: Weak Volitional Swallow: Able to elicit    Oral/Motor/Sensory  Function Overall Oral Motor/Sensory Function: Appears within functional limits for tasks assessed   Ice Chips Ice chips: Not tested   Thin Liquid Thin Liquid: Within functional limits Presentation: Self Fed;Straw    Nectar Thick Nectar Thick Liquid: Not tested   Honey Thick Honey Thick Liquid: Not tested   Puree Puree: Within functional limits Presentation: Self Fed;Spoon   Solid   GO   Tranice Laduke B. Quentin [facility], CCC-SLP [phone] Solid: Within functional limits       Shonna Chock 07/29/2014,12:28 PM

## 2014-07-29 NOTE — Progress Notes (Signed)
Patient ID: Christine Leblanc, female   DOB: [birth year], 80 y.o.   MRN: [id number]     Subjective:  Patient reports pain as mild/moderate.  Daughter at bedside was previously wanting mother to take 2 Norco 10/325 to control pain.  This appears to have caused significant lethargy and decreased ability to speak and swallow.  Narcan given with resolve of these symptoms.  Objective:   VITALS:   Filed Vitals:   07/28/14 2000 07/28/14 2251 07/29/14 0000 07/29/14 0633  BP:  121/54  146/61  Pulse:  99  96  Temp:  97.6 F (36.4 C)  98.9 F (37.2 C)  TempSrc:  Oral  Oral  Resp: 14 14 14 16   Height:      Weight:      SpO2: 90% 97% 92% 96%   Patient alert and mildly lethargic this morning.  More awake and alert than last night per nursing staff. ABD soft Sensation intact distally Dorsiflexion/Plantar flexion intact Incision: dressing C/D/I and scant drainage  Dressing removed and dry dressing applied.    Lab Results  Component Value Date   WBC 11.1* 07/28/2014   HGB 8.6* 07/28/2014   HCT 25.8* 07/28/2014   MCV 89.3 07/28/2014   PLT 134* 07/28/2014     Assessment/Plan: 2 Days Post-Op   Principal Problem:   Osteoarthritis of right knee Active Problems:   Knee osteoarthritis   Advance diet Up with therapy Plan for discharge tomorrow Discharge to SNF Dry dressing PRN WBAT Plan to hold Dilaudid and decrease Norco to 1 q 4-6 hrs prn for pain.  May have a second if pain is severe. Plan discussed with daughter at bedside.  She was agreeable with this plan.   Christine Leblanc 07/29/2014, 7:11 AM  Seen and agree with above, see other note as well.  Marchia Bond, MD Cell [phone]

## 2014-07-29 NOTE — Progress Notes (Signed)
Called to pt room per floor RN at [place]. Pt received pain meds last at 2030 2 tabs Norco 10mg /325mg , now presents with extreme lethargy. Loud gurgling hear in upper airway, lung sounds clear. P02 95 on 2 LNC, pt on continuous pulse oximeter. Pt awakens to loud voice and sternal rub. Able to cough weakly on command but falls asleep easy. Unable to clear airway. Narcan 0.4mg  IV given at 0021 per protocol. Suction set up in room. Oral airway suctioned, moderate amount of orange/brown thick sputum. At 0030 Pt able to cough up secretions well independently. Pt awake, follows commands, remains less lethargic but slow to answer. Po2 94 on 2 L [place]. Daughter at bedside. Per daughter, 1 tab of Norco was not effective on day of surgery to control pain. Discussed pain control and risk of oversedation with pt, family and nursing staff. Daughter acknowledged her understanding. Plan of care tonight to use pain meds sparingly and try muscle relaxant with 1 tab of pain meds as needed. Bedside RN to monitor closely tonight.

## 2014-07-29 NOTE — Progress Notes (Signed)
Physical Therapy Treatment Patient Details Name: Christine Leblanc MRN: [id number] DOB: 1929/10/05 Today's Date: 07/29/2014    History of Present Illness Pt admitted for R TKA.     PT Comments    Pt very lethargic and disoriented. Pt was able to perform supine to sit in bed independently, but is limited by fear when standing and amb. Pt not able to balance on own or shift weight between feet properly. Pt falling asleep during exercises and very confused. Continue to recommend SNF for ongoing Physical Therapy.     Follow Up Recommendations  SNF     Equipment Recommendations  None recommended by PT    Recommendations for Other Services       Precautions / Restrictions Precautions Precautions: Knee;Fall Restrictions RLE Weight Bearing: Weight bearing as tolerated    Mobility  Bed Mobility Overal bed mobility: Needs Assistance Bed Mobility: Supine to Sit     Supine to sit: Min guard     General bed mobility comments: min guard for safety. Pt able to get self to EOB independently.   Transfers Overall transfer level: Needs assistance Equipment used: Rolling walker (2 wheeled) Transfers: Sit to/from [facility] Sit to Stand: +2 physical assistance;Min assist Stand pivot transfers: +2 physical assistance;Min assist       General transfer comment: Pt needed min assist +2 to come to standing and to perform a stand pivot transfer because pt is having trouble shifting weight properly through feet.   Ambulation/Gait                 Stairs            Wheelchair Mobility    Modified Rankin (Stroke Patients Only)       Balance                                    Cognition Arousal/Alertness: Lethargic;Suspect due to medications Behavior During Therapy: [facility] for tasks assessed/performed Overall Cognitive Status: Within Functional Limits for tasks assessed       Memory: Decreased short-term memory               Exercises Total Joint Exercises Ankle Circles/Pumps: PROM;Seated;Right;10 reps Hip ABduction/ADduction: PROM;Seated;Right;10 reps Long Arc Quad: PROM;Seated;Right;10 reps Knee Flexion: PROM;Seated;Right;10 reps    General Comments        Pertinent Vitals/Pain Did not rate pain but stated that she could really feel the pain today. Pt repositioned in recliner for comfort with heel propped to extend R knee.    Home Living                      Prior Function            PT Goals (current goals can now be found in the care plan section) Progress towards PT goals: Progressing toward goals    Frequency  7X/week    PT Plan      Co-evaluation             End of Session Equipment Utilized During Treatment: Gait belt Activity Tolerance: Patient tolerated treatment well;Patient limited by fatigue;Patient limited by lethargy;Patient limited by pain Patient left: in chair;with call bell/phone within reach     Time: 0923-0954 PT Time Calculation (min): 31 min  Charges:  G Codes:      BRASFIELD,Espn Zeman,SPTA 07/29/2014, 10:37 AM

## 2014-07-29 NOTE — Progress Notes (Signed)
Patient has been hypoxic, mild mental status changes, although better than yesterday, with some cough.  She passed her swallow study today.  Her oxygen saturation was 84%, but has now improved to 94% with Lasix  She has also had copious urine output.  Chest x-ray demonstrated atelectasis versus infiltrate.  Her mental status is now normal, and she seems clinically significantly improved. Her wound is clean, her leg is not swollen.  Impression right total knee replacement, with acute blood loss anemia, status post one unit of packed red blood cells, with what appears to be fluid overload, mental status changes/delirium secondary to narcotics, combined with hypoxemia do to pulmonary atelectasis versus infiltrate.  Plan: We are avoiding all narcotics if at all possible. I mean have a Foley placed in order to monitor her urine output, give her another dose of Lasix, we do need to get the second unit of packed red blood cells and, and I will plan for another dose of Lasix after the red blood cells. Also recheck a chest x-ray in the morning, and if she worsens then we will plan for medical consultation. I've spoken with the son, Christine Leblanc, as well as the grandson, and we will and for her to stay in the hospital through the course of the weekend until medically optimized. She has not yet turned the corner.  Johnny Bridge, MD

## 2014-07-30 ENCOUNTER — Inpatient Hospital Stay (HOSPITAL_COMMUNITY): Payer: Medicare Other

## 2014-07-30 LAB — BASIC METABOLIC PANEL
Anion gap: 13 (ref 5–15)
BUN: 44 mg/dL — ABNORMAL HIGH (ref 6–23)
CALCIUM: 8.9 mg/dL (ref 8.4–10.5)
CO2: 27 meq/L (ref 19–32)
CREATININE: 1.23 mg/dL — AB (ref 0.50–1.10)
Chloride: 97 mEq/L (ref 96–112)
GFR calc Af Amer: 46 mL/min — ABNORMAL LOW (ref >90)
GFR calc non Af Amer: 39 mL/min — ABNORMAL LOW (ref >90)
GLUCOSE: 141 mg/dL — AB (ref 70–99)
Potassium: 4.8 mEq/L (ref 3.7–5.3)
Sodium: 137 mEq/L (ref 137–147)

## 2014-07-30 LAB — CBC
HCT: 29.5 % — ABNORMAL LOW (ref 36.0–46.0)
HEMOGLOBIN: 10.1 g/dL — AB (ref 12.0–15.0)
MCH: 29.7 pg (ref 26.0–34.0)
MCHC: 34.2 g/dL (ref 30.0–36.0)
MCV: 86.8 fL (ref 78.0–100.0)
Platelets: 144 10*3/uL — ABNORMAL LOW (ref 150–400)
RBC: 3.4 MIL/uL — AB (ref 3.87–5.11)
RDW: 14.6 % (ref 11.5–15.5)
WBC: 9.8 10*3/uL (ref 4.0–10.5)

## 2014-07-30 MED ORDER — HYDROCODONE-ACETAMINOPHEN 5-325 MG PO TABS
1.0000 | ORAL_TABLET | Freq: Four times a day (QID) | ORAL | Status: DC | PRN
  Administered 2014-07-30 – 2014-08-02 (×8): 1 via ORAL
  Filled 2014-07-30 (×8): qty 1

## 2014-07-30 NOTE — Progress Notes (Addendum)
     Subjective:  Patient reports pain as mild.  Moderate discomfort when up with PT.  Occasion leg cramping that is not new since hospitalization.  Eating and drinking with dys 2.  No BM since Monday.  Slept well with minimal discomfort.  Breathing better last night and this morning on 2L.  No SOB, CP.  Intermittent cough productive (brown color last night, clear this morning).  Objective:   VITALS:   Filed Vitals:   07/30/14 0223 07/30/14 0323 07/30/14 0355 07/30/14 0400  BP: 138/69 135/68 142/72   Pulse: 80 76 83   Temp: 97.9 F (36.6 C) 97.8 F (36.6 C) 97.6 F (36.4 C)   TempSrc: Oral Oral Oral   Resp: 16 16 16 16   Height:      Weight:      SpO2: 97% 96% 96% 96%    ABD soft Neurovascular intact Sensation intact distally Intact pulses distally Dorsiflexion/Plantar flexion intact Incision: dressing C/D/I and scant drainage   Lab Results  Component Value Date   WBC 9.8 07/30/2014   HGB 10.1* 07/30/2014   HCT 29.5* 07/30/2014   MCV 86.8 07/30/2014   PLT 144* 07/30/2014     Assessment/Plan: 3 Days Post-Op   Principal Problem:   Osteoarthritis of right knee Active Problems:   Knee osteoarthritis   Anemia due to acute blood loss   Delirium, drug-induced   Atelectasis   Up with therapy Continue foley due to strict I&O and urinary output monitoring WBAT Clean Dressing PRN Continue DYS 2 DG Chest appears improved from yesterday.  Cough productive.  Continue to monitor. Plan to DC foley SAT DC to SNF prob mon if medically optimized.  Roxan Hockey PA-S  DOUGLAS PARRY, BRANDON 07/30/2014, 7:12 AM  Seen and agree with above.  Creatinine slighly elevated, likely from diuresis, will stop lasix, recheck in am.  She is very much improved, she can do a straight leg raise, she walked in the hall today, denies shortness of breath, and says that her mental function is now back to normal. We will plan to discontinue the Foley tomorrow, monitor urine output closely  due to diuresis, and plan for skilled nursing placement probably Monday if she stays stable through the weekend.  Marchia Bond, MD Cell [phone]

## 2014-07-30 NOTE — Progress Notes (Signed)
Spoke with nurse and family.  She is overall much improved by all indices.    Will continue with fluid diuresis and check labs, xr, in am.  Johnny Bridge, MD

## 2014-07-30 NOTE — Care Management Note (Signed)
CARE MANAGEMENT NOTE 07/30/2014  Patient:  Christine Leblanc, Christine Leblanc   Account Number:  [id number]  Date Initiated:  07/28/2014  Documentation initiated by:  Ricki Miller  Subjective/Objective Assessment:   80 yr old female s/p right total knee arthroplasty.     Action/Plan:   Patient is for shortterm rehab. Social worker is aware. Patient wants to go to [facility].  Will discharge when medically stable.   Anticipated DC Date:  08/02/2014   Anticipated DC Plan:  SKILLED NURSING FACILITY  In-house referral  Clinical Social Worker      DC Planning Services  CM consult      Choice offered to / List presented to:             Status of service:  Completed, signed off Medicare Important Message given?  YES (If response is "NO", the following Medicare IM given date fields will be blank) Date Medicare IM given:  07/28/2014 Medicare IM given by:  Ricki Miller Date Additional Medicare IM given:  07/30/2014 Additional Medicare IM given by:  Ricki Miller  Discharge Disposition:  [place]  Per UR Regulation:  Reviewed for med. necessity/level of care/duration of stay

## 2014-07-30 NOTE — Progress Notes (Signed)
Seen and agreed 07/30/2014 Jacqualyn Posey PTA [phone] pager [phone] office

## 2014-07-30 NOTE — Progress Notes (Signed)
Speech Language Pathology Treatment: Dysphagia  Patient Details Name: Christine Leblanc MRN: [id number] DOB: Dec 07, 1929 Today's Date: 07/30/2014 Time: 9244-6286 SLP Time Calculation (min): 28 min  Assessment / Plan / Recommendation Clinical Impression  Pt initially demonstrated immediate coughing with thin liquid and Dys 2 textures. Family present reports that he did not observe coughing during lunch meal, but that coughing began after completion of meal. SLP provided small sips of warm water with no coughing noted. With Max cues for swallowing strategies, patient alternated between solid and liquid consistencies with seemingly increased airway protection. Given the above as well as patient's hx, continue to suspect a primary esophageal dysphagia. MD may wish to consider esophageal assessment.   HPI HPI: 80 year old female admitted 07/27/14 for right knee surgery. PMH significant for GERD, hiatal hernia, gastritis, esophageal dilation ~5 years ago per pt. BSE requested due to concern about pt report of food "not going down"   Pertinent Vitals Afebrile, lung sounds unchanged  SLP Plan  Continue with current plan of care    Recommendations Diet recommendations: Dysphagia 2 (fine chop);Thin liquid Liquids provided via: Cup;Straw Medication Administration: Whole meds with puree Supervision: Patient able to self feed;Full supervision/cueing for compensatory strategies Compensations: Slow rate;Small sips/bites;Follow solids with liquid (start meals with warm drink) Postural Changes and/or Swallow Maneuvers: Seated upright 90 degrees;Upright 30-60 min after meal              Oral Care Recommendations: Oral care BID Follow up Recommendations: Skilled Nursing facility;24 hour supervision/assistance Plan: Continue with current plan of care    GO      Germain Osgood, M.A. CCC-SLP [phone]  Germain Osgood 07/30/2014, 4:21 PM

## 2014-07-30 NOTE — Progress Notes (Signed)
Physical Therapy Treatment Patient Details Name: Christine Leblanc MRN: [id number] DOB: 14-Dec-1929 Today's Date: 07/30/2014    History of Present Illness Pt admitted for R TKA.     PT Comments    Pt was much stronger and more alert this session. Pt able to amb with proper balance and control of walker without fear of falling today. Pt was able to perform exercises well with good strength in her RLE. Pt still having some confusion, but said she feels much better today.   Follow Up Recommendations  SNF     Equipment Recommendations  None recommended by PT    Recommendations for Other Services       Precautions / Restrictions Precautions Precautions: Knee;Fall Required Braces or Orthoses: Knee Immobilizer - Right Knee Immobilizer - Right: On when out of bed or walking;Discontinue once straight leg raise with < 10 degree lag Restrictions Weight Bearing Restrictions: Yes RLE Weight Bearing: Weight bearing as tolerated    Mobility  Bed Mobility Overal bed mobility: Needs Assistance Bed Mobility: Supine to Sit     Supine to sit: Min assist     General bed mobility comments: min assist from rails on bed and utilized my arm to pull upper body into sitting.  Transfers Overall transfer level: Needs assistance Equipment used: Rolling walker (2 wheeled) Transfers: Sit to/from Stand Sit to Stand: Min guard         General transfer comment: min guard for safety and to steady. Pt able to stand from bed on own and had good control of her balance.   Ambulation/Gait Ambulation/Gait assistance: Min guard Ambulation Distance (Feet): 50 Feet Assistive device: Rolling walker (2 wheeled) Gait Pattern/deviations: Step-to pattern;Decreased stride length Gait velocity: very decreased Gait velocity interpretation: Below normal speed for age/gender General Gait Details: pt was able to balance on both feet today without leaning posteriorly. Had good control of walker and sequencing.  Constant verbal cues for posturing.    Stairs            Wheelchair Mobility    Modified Rankin (Stroke Patients Only)       Balance                                    Cognition Arousal/Alertness: Awake/alert Behavior During Therapy: WFL for tasks assessed/performed Overall Cognitive Status: Within Functional Limits for tasks assessed                      Exercises Total Joint Exercises Quad Sets: AROM;Seated;10 reps;Right Heel Slides: AAROM;Seated;Right;10 reps Hip ABduction/ADduction: AAROM;Seated;Right;10 reps Straight Leg Raises: AROM;Seated;Right;10 reps Long Arc Quad: AAROM;Seated;10 reps    General Comments        Pertinent Vitals/Pain 3/10. Pt repositioned in recliner for comfort with ankle propped for R knee extension.     Home Living                      Prior Function            PT Goals (current goals can now be found in the care plan section) Progress towards PT goals: Progressing toward goals    Frequency  7X/week    PT Plan      Co-evaluation             End of Session Equipment Utilized During Treatment: Gait belt Activity Tolerance: Patient tolerated treatment well Patient left:  in chair;with family/visitor present;with call bell/phone within reach     Time: [phone] PT Time Calculation (min): 42 min  Charges:                       G Codes:      BRASFIELD,Christine Leblanc,SPTA 07/30/2014, 10:14 AM

## 2014-07-31 LAB — CBC
HEMATOCRIT: 25 % — AB (ref 36.0–46.0)
Hemoglobin: 8.6 g/dL — ABNORMAL LOW (ref 12.0–15.0)
MCH: 29.7 pg (ref 26.0–34.0)
MCHC: 34.4 g/dL (ref 30.0–36.0)
MCV: 86.2 fL (ref 78.0–100.0)
Platelets: 153 10*3/uL (ref 150–400)
RBC: 2.9 MIL/uL — ABNORMAL LOW (ref 3.87–5.11)
RDW: 14.3 % (ref 11.5–15.5)
WBC: 10 10*3/uL (ref 4.0–10.5)

## 2014-07-31 LAB — BASIC METABOLIC PANEL
Anion gap: 10 (ref 5–15)
BUN: 34 mg/dL — AB (ref 6–23)
CHLORIDE: 103 meq/L (ref 96–112)
CO2: 25 mEq/L (ref 19–32)
CREATININE: 0.71 mg/dL (ref 0.50–1.10)
Calcium: 8.6 mg/dL (ref 8.4–10.5)
GFR calc non Af Amer: 78 mL/min — ABNORMAL LOW (ref >90)
GFR, EST AFRICAN AMERICAN: 90 mL/min — AB (ref >90)
GLUCOSE: 100 mg/dL — AB (ref 70–99)
Potassium: 4.4 mEq/L (ref 3.7–5.3)
Sodium: 138 mEq/L (ref 137–147)

## 2014-07-31 MED ORDER — DEXTROSE 5 % IV SOLN
1.0000 g | INTRAVENOUS | Status: DC
  Administered 2014-07-31 – 2014-08-02 (×3): 1 g via INTRAVENOUS
  Filled 2014-07-31 (×5): qty 10

## 2014-07-31 MED ORDER — MAGNESIUM CITRATE PO SOLN
0.5000 | Freq: Once | ORAL | Status: AC
  Administered 2014-07-31: 0.5 via ORAL
  Filled 2014-07-31: qty 296

## 2014-07-31 MED ORDER — FLEET ENEMA 7-19 GM/118ML RE ENEM
1.0000 | ENEMA | Freq: Every day | RECTAL | Status: DC | PRN
  Administered 2014-07-31 – 2014-08-02 (×2): 1 via RECTAL
  Filled 2014-07-31 (×2): qty 1

## 2014-07-31 NOTE — Progress Notes (Signed)
Security to 5N for 3 family members in hallway stating the same disparaging remarks as prior on phone, odor of some chemical component coming from family. Security aware of preceding events and current behaviors. States they will intervene if necessary and the group can be asked to leave premises based on preceding behaviors. Charge nurse states she does not anticipate any further disruption from group.

## 2014-07-31 NOTE — Progress Notes (Signed)
Daughter phones from home for me to check on her mother regarding stomach ache. Patient states she does not have relief from colace and senokot the way she does with peri colace. Denies need for nausea medication. Patient asked if she would like for me to phone daughter to come and stay with her, patient says no

## 2014-07-31 NOTE — Progress Notes (Signed)
Daughter phones regarding patient not having a bowel movement for over a week. Daughter insist upon my leaving the patient I am currently with and immediately giving an enema to Christine Leblanc. I stated to family that patient was on bedpan currently and after she did not have results I would check on further orders for her. Daughter screaming into phone for me to " get my damn ass in there now", phone passed off to charge nurse and charge made aware of my telling family we would speak with them appropriately and not be subjected to abuse.

## 2014-07-31 NOTE — Progress Notes (Signed)
Physical Therapy Treatment Patient Details Name: Christine Leblanc MRN: [id number] DOB: August 30, 1929 Today's Date: 07/31/2014    History of Present Illness Pt admitted for R TKA.     PT Comments    Pt continues to work with therapy, though was unable to increase amb distance today.  Pt does well participating in exercises.  Will continue to follow.    Follow Up Recommendations  SNF     Equipment Recommendations  None recommended by PT    Recommendations for Other Services       Precautions / Restrictions Precautions Precautions: Knee;Fall Required Braces or Orthoses: Knee Immobilizer - Right Knee Immobilizer - Right: On when out of bed or walking;Discontinue once straight leg raise with < 10 degree lag Restrictions Weight Bearing Restrictions: Yes RLE Weight Bearing: Weight bearing as tolerated    Mobility  Bed Mobility                  Transfers Overall transfer level: Needs assistance Equipment used: Rolling walker (2 wheeled) Transfers: Sit to/from Stand Sit to Stand: Min assist         General transfer comment: cues for UE use and positioning of LEs.  pt did not seem to recall any cues given to her on previous sessions.    Ambulation/Gait Ambulation/Gait assistance: Min guard Ambulation Distance (Feet): 50 Feet Assistive device: Rolling walker (2 wheeled) Gait Pattern/deviations: Step-to pattern;Decreased step length - left;Decreased stance time - right;Decreased stride length;Trunk flexed   Gait velocity interpretation: Below normal speed for age/gender General Gait Details: pt needs encouragement for ambulation and cues for safe use of RW and upright posture.     Stairs            Wheelchair Mobility    Modified Rankin (Stroke Patients Only)       Balance                                    Cognition Arousal/Alertness: Awake/alert Behavior During Therapy: WFL for tasks assessed/performed Overall Cognitive Status: Within  Functional Limits for tasks assessed       Memory: Decreased short-term memory (Unclear if this is baseline.)              Exercises Total Joint Exercises Ankle Circles/Pumps: AROM;Both;10 reps Hip ABduction/ADduction: AROM;Right;5 reps Straight Leg Raises: AROM;Right;5 reps Long Arc Quad: AAROM;Right;10 reps Knee Flexion: AAROM;Right;10 reps Goniometric ROM: ~ 10 - 75    General Comments        Pertinent Vitals/Pain Did not rate, but stated "Not much right now."  Premedicated.      Home Living                      Prior Function            PT Goals (current goals can now be found in the care plan section) Acute Rehab PT Goals Time For Goal Achievement: 08/03/14 Potential to Achieve Goals: Good Progress towards PT goals: Progressing toward goals    Frequency  7X/week    PT Plan Current plan remains appropriate    Co-evaluation             End of Session Equipment Utilized During Treatment: Gait belt;Right knee immobilizer Activity Tolerance: Patient tolerated treatment well Patient left: in chair;with call bell/phone within reach     Time: 1418-1450 PT Time Calculation (min): 32 min  Charges:  $  Gait Training: 8-22 mins $Therapeutic Exercise: 8-22 mins                    G CodesCatarina Leblanc, Christine Leblanc 07/31/2014, 3:20 PM

## 2014-07-31 NOTE — Progress Notes (Addendum)
     Subjective:  Patient reports pain as mild.  "I feel more normal today than I have lately".  No BM.  No new events.  No CP, mild cough when eating.  Using IS.  Denies dizziness.  Objective:   VITALS:   Filed Vitals:   07/30/14 2112 07/31/14 0000 07/31/14 0400 07/31/14 0516  BP: 157/74   158/73  Pulse: 85   91  Temp: 98 F (36.7 C)   97.7 F (36.5 C)  TempSrc: Oral   Oral  Resp: 18 18 18 18   Height:      Weight:      SpO2: 95% 97% 97% 97%    Neurologically intact Sensation intact distally Dorsiflexion/Plantar flexion intact Incision: dressing C/D/I Mild increased effort with breathing.  Good color. Abdomen soft NTND, no rebound or guarding.  Lab Results  Component Value Date   WBC 10.0 07/31/2014   HGB 8.6* 07/31/2014   HCT 25.0* 07/31/2014   MCV 86.2 07/31/2014   PLT 153 07/31/2014   BMET    Component Value Date/Time   NA 138 07/31/2014 0404   K 4.4 07/31/2014 0404   CL 103 07/31/2014 0404   CO2 25 07/31/2014 0404   GLUCOSE 100* 07/31/2014 0404   BUN 34* 07/31/2014 0404   CREATININE 0.71 07/31/2014 0404   CREATININE 0.76 05/01/2011 0945   CALCIUM 8.6 07/31/2014 0404   GFRNONAA 78* 07/31/2014 0404   GFRAA 90* 07/31/2014 0404      Assessment/Plan: 4 Days Post-Op   Principal Problem:   Osteoarthritis of right knee Active Problems:   Knee osteoarthritis   Anemia due to acute blood loss   Delirium, drug-induced   Atelectasis   Renal insufficiency improved ABLA persistent, not sure why she dropped so much from yesterday, no active signs of bleeding.  Will recheck in am. Pulmonary atelectasis vs. Infiltrate, no white count, no fever, no signs clinically of pna, will recheck cxr in am. Ambulate/mobilize Delirium resolved C/w PT Possible SNF Monday if all stabilizes out RN staff to work on [employer] today.  Dc foley Spoke/updated family also via telephone.  Jakarius Flamenco P 07/31/2014, 9:15 AM   Marchia Bond, MD Cell [phone]  ADDENDUM:  She is having significant  sputum production and cough, will start antibiotics to cover for possible pneumonia, will plan for rocephin iv and ceftin 500 bid at discharge.

## 2014-07-31 NOTE — Progress Notes (Signed)
Family irate about patient not having bowel movement.  Orders entered exactly as family demanded.    I have emphasized to them that we have been trying our best to care for her mother, who has very fragile and delicate medical conditions, and trying to get her through a very large surgery.  Fleets enema ordered for as many as possible to get her bowels moving, along with the mag citrate, the colace, the senna, the miralax, etc.  Johnny Bridge, MD

## 2014-07-31 NOTE — Progress Notes (Signed)
Called daughter Cecille Rubin again to follow up [phone] and she indicated that Mrs. Suh had indeed had a bowel movement and is doing much better and that there was nothing further that needed to be done at the current time.  Johnny Bridge, MD

## 2014-07-31 NOTE — Progress Notes (Signed)
This charge nurse got involved with pt with concerns of not able to have BM and ABD discomfort. Daughters on the phone were very upset with the situation. MD notified and orders received also request MD to call daughter. Fleet enema given and half bottle of Mg citrate given. Positive result in 10 minutes with large BM and gas.  Pt express much relief.  2 daughters came while pt was having  BM.  Rn explained pt's bowel regiments and PRN treatment to daughters. They seem to be satisfied.  Respiratory treatment also given.  Patient resting and one daughter remain overnight.  Will give updates to Manuela Schwartz, pt's primary nurse. I will assist as needed.

## 2014-08-01 ENCOUNTER — Inpatient Hospital Stay (HOSPITAL_COMMUNITY): Payer: Medicare Other

## 2014-08-01 LAB — CBC
HCT: 22.2 % — ABNORMAL LOW (ref 36.0–46.0)
HEMOGLOBIN: 7.5 g/dL — AB (ref 12.0–15.0)
MCH: 29.8 pg (ref 26.0–34.0)
MCHC: 33.8 g/dL (ref 30.0–36.0)
MCV: 88.1 fL (ref 78.0–100.0)
Platelets: 162 10*3/uL (ref 150–400)
RBC: 2.52 MIL/uL — ABNORMAL LOW (ref 3.87–5.11)
RDW: 14.4 % (ref 11.5–15.5)
WBC: 8.9 10*3/uL (ref 4.0–10.5)

## 2014-08-01 LAB — OCCULT BLOOD X 1 CARD TO LAB, STOOL: Fecal Occult Bld: POSITIVE — AB

## 2014-08-01 LAB — PREPARE RBC (CROSSMATCH)

## 2014-08-01 MED ORDER — ACETAMINOPHEN 325 MG PO TABS
650.0000 mg | ORAL_TABLET | Freq: Once | ORAL | Status: AC
  Administered 2014-08-01: 650 mg via ORAL
  Filled 2014-08-01: qty 2

## 2014-08-01 MED ORDER — SODIUM CHLORIDE 0.9 % IV SOLN: Freq: Once | INTRAVENOUS | Status: DC

## 2014-08-01 NOTE — Progress Notes (Addendum)
Clinical Social Work Department CLINICAL SOCIAL WORK PLACEMENT NOTE 08/01/2014  Patient:  MINAAL, STRUCKMAN  Account Number:  [id number] Admit date:  07/27/2014  Clinical Social Worker:  Blima Rich, Latanya Presser  Date/time:  08/01/2014 02:44 PM  Clinical Social Work is seeking post-discharge placement for this patient at the following level of care:   [place]   (*CSW will update this form in Epic as items are completed)   07/28/2014  Patient/family provided with [facility] Department of Clinical Social Work's list of facilities offering this level of care within the geographic area requested by the patient (or if unable, by the patient's family).  07/28/2014  Patient/family informed of their freedom to choose among providers that offer the needed level of care, that participate in Medicare, Medicaid or managed care program needed by the patient, have an available bed and are willing to accept the patient.  07/28/2014  Patient/family informed of [facility]' ownership interest in [facility], as well as of the fact that they are under no obligation to receive care at this facility.  PASARR submitted to EDS on 07/28/2014 PASARR number received on 07/28/2014  FL2 transmitted to all facilities in geographic area requested by pt/family on  07/28/2014 FL2 transmitted to all facilities within larger geographic area on   Patient informed that his/her managed care company has contracts with or will negotiate with  certain facilities, including the following:     Patient/family informed of bed offers received:   Patient chooses bed at [facility] (per handoff from covering [place], [place]) Physician recommends and patient chooses bed at    Patient to be transferred to [facility] [place], SNF on  August 03, 2014 ([place], [place]) Patient to be transferred to facility by [place] [facility], LCSWA) Patient and family notified of transfer on August 03, 2014 Donia Pounds,  [place]) Name of family member notified:  Maudie Mercury, daughter via phone ([facility], Latanya Presser)  The following physician request were entered in Epic:   Additional Comments:

## 2014-08-01 NOTE — Progress Notes (Signed)
Clinical Education officer, museum (CSW) attempted to meet with patient however she was out of the room for a procedure. CSW contacted patient's daughter [name] who reported that patient is still agreeable to SNF and prefers [facility].   Blima Rich, [place] CSW [phone]

## 2014-08-01 NOTE — Progress Notes (Signed)
     Subjective:  Patient reports pain as mild.  Denies cough, but feels weak. Positive bowel movement last night, feels much better from that regard. Denies shortness of breath or chest pain.  Objective:   VITALS:   Filed Vitals:   07/31/14 2000 07/31/14 2321 07/31/14 2351 08/01/14 0641  BP:    147/68  Pulse:    85  Temp:    97.7 F (36.5 C)  TempSrc:    Oral  Resp: 18 18  18   Height:      Weight:      SpO2: 98% 98% 99% 100%    She has no cyanosis, 100% O2 saturation on 2 L of oxygen. Abdomen is soft nontender nondistended no rebound or guarding. Right leg has no significant swelling, her dressing is clean, EHL and FHL are intact.   Lab Results  Component Value Date   WBC 8.9 08/01/2014   HGB 7.5* 08/01/2014   HCT 22.2* 08/01/2014   MCV 88.1 08/01/2014   PLT 162 08/01/2014   BMET    Component Value Date/Time   NA 138 07/31/2014 0404   K 4.4 07/31/2014 0404   CL 103 07/31/2014 0404   CO2 25 07/31/2014 0404   GLUCOSE 100* 07/31/2014 0404   BUN 34* 07/31/2014 0404   CREATININE 0.71 07/31/2014 0404   CREATININE 0.76 05/01/2011 0945   CALCIUM 8.6 07/31/2014 0404   GFRNONAA 78* 07/31/2014 0404   GFRAA 90* 07/31/2014 0404     Assessment/Plan: 5 Days Post-Op   Principal Problem:   Osteoarthritis of right knee Active Problems:   Knee osteoarthritis   Anemia due to acute blood loss   Delirium, drug-induced   Atelectasis  GI constipation: Improved with the bowel regimen, however I am concerned because her hemoglobin continues to drop, without significant source. I am repeating a hemoglobin tomorrow morning, we will hold xarelto, and check a Hemoccult stool.  Pulmonary infiltrate versus pneumonia: Continue with IV Rocephin, recheck chest x-ray this morning, continue with oxygen.  Status post total knee replacement: Continue physical therapy, doing well  Mild renal insufficiency: Resolved  Delirium, drug-induced: Resolved  DVT prophylaxis: We'll continue with ambulation and sequential  compression devices hold the xarelto do to the question GI bleed above.  Disposition: Plan for skilled nursing facility, possibly tomorrow if all of her medical issues stabilize.   Christine Leblanc P 08/01/2014, 9:33 AM   Marchia Bond, MD Cell [phone]

## 2014-08-01 NOTE — Progress Notes (Signed)
Physical Therapy Treatment Patient Details Name: Christine Leblanc MRN: [id number] DOB: Sep 01, 1929 Today's Date: 08/01/2014    History of Present Illness Pt admitted for R TKA.     PT Comments    Pt receiving PRBC's due to low Hgb.  Reviewed HEP however pt tired & falling asleep easily with during exercises.  Assisted pt to [employer] with min guard for safety & cues for technique.  Cont with current POC & d/c recommendation of SNF.      Follow Up Recommendations  SNF     Equipment Recommendations  None recommended by PT    Recommendations for Other Services       Precautions / Restrictions Precautions Precautions: Knee;Fall Required Braces or Orthoses: Knee Immobilizer - Right Restrictions RLE Weight Bearing: Weight bearing as tolerated    Mobility  Bed Mobility               General bed mobility comments: pt sitting in recliner upon arrival  Transfers Overall transfer level: Needs assistance Equipment used: Rolling walker (2 wheeled) Transfers: Sit to/from [facility] Sit to Stand: Min guard Stand pivot transfers: Min guard       General transfer comment: cues for hand placement, use of UE's for controlled descnt & positioning of RLE with stand>sit.    Ambulation/Gait             General Gait Details: pt receiving blood transfusion due to low Hgb   Stairs            Wheelchair Mobility    Modified Rankin (Stroke Patients Only)       Balance                                    Cognition Arousal/Alertness: Awake/alert Behavior During Therapy: WFL for tasks assessed/performed Overall Cognitive Status: Within Functional Limits for tasks assessed                      Exercises Total Joint Exercises Ankle Circles/Pumps: AROM;Both;15 reps Quad Sets: AROM;Strengthening;Both;15 reps Heel Slides: Strengthening;15 reps;AROM;Right Hip ABduction/ADduction: AROM;Strengthening;Right;15 reps Long Arc Quad:  AAROM;Strengthening;Right;15 reps Knee Flexion: AAROM;Right;15 reps    General Comments        Pertinent Vitals/Pain Reports mild pain but did not rate.      Home Living                      Prior Function            PT Goals (current goals can now be found in the care plan section) Acute Rehab PT Goals PT Goal Formulation: With patient/family Time For Goal Achievement: 08/03/14 Potential to Achieve Goals: Good Progress towards PT goals: Progressing toward goals    Frequency  7X/week    PT Plan Current plan remains appropriate    Co-evaluation             End of Session   Activity Tolerance: Patient tolerated treatment well Patient left: in chair;with call bell/phone within reach     Time: 5427-0623 PT Time Calculation (min): 16 min  Charges:  $Therapeutic Exercise: 8-22 mins                    G Codes:      Sena Hitch 08/01/2014, 2:23 PM   Sarajane Marek, [place] 08/01/2014

## 2014-08-02 ENCOUNTER — Telehealth: Payer: Self-pay

## 2014-08-02 LAB — TYPE AND SCREEN
ABO/RH(D): AB POS
ANTIBODY SCREEN: NEGATIVE
UNIT DIVISION: 0
Unit division: 0
Unit division: 0

## 2014-08-02 LAB — CBC
HCT: 27.4 % — ABNORMAL LOW (ref 36.0–46.0)
HEMOGLOBIN: 9.2 g/dL — AB (ref 12.0–15.0)
MCH: 29.8 pg (ref 26.0–34.0)
MCHC: 33.6 g/dL (ref 30.0–36.0)
MCV: 88.7 fL (ref 78.0–100.0)
Platelets: 151 10*3/uL (ref 150–400)
RBC: 3.09 MIL/uL — ABNORMAL LOW (ref 3.87–5.11)
RDW: 14.2 % (ref 11.5–15.5)
WBC: 10.8 10*3/uL — AB (ref 4.0–10.5)

## 2014-08-02 NOTE — Progress Notes (Signed)
Physical Therapy Treatment Patient Details Name: ANNAH JASKO MRN: [id number] DOB: Jan 12, 1929 Today's Date: 08/02/2014    History of Present Illness Pt admitted for R TKA.     PT Comments    Patient progressing with ambulation this session. Complained of sore on bottocks. RN made aware. Patient planning to DC hopefully later today to [facility]  Follow Up Recommendations  SNF     Equipment Recommendations  None recommended by PT    Recommendations for Other Services       Precautions / Restrictions Precautions Precautions: Knee;Fall Restrictions RLE Weight Bearing: Weight bearing as tolerated    Mobility  Bed Mobility Overal bed mobility: Needs Assistance Bed Mobility: Sit to Supine       Sit to supine: Min guard   General bed mobility comments: Cues for positioning  Transfers Overall transfer level: Needs assistance Equipment used: Rolling walker (2 wheeled)   Sit to Stand: Min assist         General transfer comment: cues for hand placement, use of UE's for controlled descnt & positioning of RLE with stand>sit.    Ambulation/Gait Ambulation/Gait assistance: Min guard Ambulation Distance (Feet): 80 Feet Assistive device: Rolling walker (2 wheeled) Gait Pattern/deviations: Step-to pattern;Decreased step length - right;Decreased step length - left Gait velocity: decreased   General Gait Details: Cues for posture and safe use of RW especially with turning   Stairs            Wheelchair Mobility    Modified Rankin (Stroke Patients Only)       Balance                                    Cognition Arousal/Alertness: Awake/alert Behavior During Therapy: WFL for tasks assessed/performed Overall Cognitive Status: Within Functional Limits for tasks assessed                      Exercises Total Joint Exercises Long Arc Quad: Strengthening;Right;AROM;10 reps Goniometric ROM: ~90 knee flexion in sitting    General  Comments        Pertinent Vitals/Pain no apparent distress     Home Living                      Prior Function            PT Goals (current goals can now be found in the care plan section) Progress towards PT goals: Progressing toward goals    Frequency  7X/week    PT Plan Current plan remains appropriate    Co-evaluation             End of Session Equipment Utilized During Treatment: Gait belt Activity Tolerance: Patient tolerated treatment well Patient left: in chair;with call bell/phone within reach     Time: 0805-0831 PT Time Calculation (min): 26 min  Charges:  $Gait Training: 23-37 mins                    G Codes:      Jacqualyn Posey 08/02/2014, 8:46 AM 08/02/2014 Jacqualyn Posey PTA [phone] pager [phone] office

## 2014-08-02 NOTE — Telephone Encounter (Signed)
A doctor from [facility] call to inform [facility] that the patient has + Hem stools and anemia. The patient would like to follow up with [facility]. She is in the hospital right now for R knee surgery. I told the doctor to have the patient call when she is discharge to make a follow up appointment.

## 2014-08-02 NOTE — Progress Notes (Signed)
Speech Language Pathology Treatment: Dysphagia  Patient Details Name: Christine Leblanc MRN: [id number] DOB: 1929/11/13 Today's Date: 08/02/2014 Time: 4008-6761 SLP Time Calculation (min): 10 min  Assessment / Plan / Recommendation Clinical Impression  Pt presents with delayed cough x1 across all PO trials consisting of Dys 2 & 1 textures and thin liquids via cup/straw sips. Vocal quality remained clear, and patient only required Min-Mod multimodal cueing from SLP today to utilize aspiration and esophageal precautions. Most recent CXR 8/2 is without evidence of PNA. Recommend to continue current diet with full supervision for use of strategies. MD may wish to consider esophageal assessment.   HPI HPI: 80 year old female admitted 07/27/14 for right knee surgery. PMH significant for GERD, hiatal hernia, gastritis, esophageal dilation ~5 years ago per pt. BSE requested due to concern about pt report of food "not going down"   Pertinent Vitals n/a  SLP Plan  Continue with current plan of care    Recommendations Diet recommendations: Dysphagia 2 (fine chop);Thin liquid Liquids provided via: Cup;Straw Medication Administration: Whole meds with puree Supervision: Patient able to self feed;Full supervision/cueing for compensatory strategies Compensations: Slow rate;Small sips/bites;Follow solids with liquid (start meals with warm liquid) Postural Changes and/or Swallow Maneuvers: Seated upright 90 degrees;Upright 30-60 min after meal              Oral Care Recommendations: Oral care BID Follow up Recommendations: Skilled Nursing facility;24 hour supervision/assistance Plan: Continue with current plan of care    GO      Germain Osgood, M.A. CCC-SLP [phone]  Germain Osgood 08/02/2014, 2:24 PM

## 2014-08-02 NOTE — Progress Notes (Signed)
     Subjective:  Patient reports pain as mild.  Feeling She has felt weak intermittently.  No cough, breathing easier.  No CP or SOB  Objective:   VITALS:   Filed Vitals:   08/01/14 1304 08/01/14 1345 08/01/14 1800 08/01/14 2105  BP: 139/52  168/66 154/61  Pulse: 89 99 88 83  Temp: 98.4 F (36.9 C) 98.6 F (37 C) 98.2 F (36.8 C) 98.4 F (36.9 C)  TempSrc: Oral Oral Oral Oral  Resp: 16 16 16 16   Height:      Weight:      SpO2: 97% 99% 98% 98%   Awoke from sleep upon entering room.  Sitting upright in bed.  Alert and oriented ABD soft, non tender, no rebound/guarding. Neurovascularly intact Pulses and Sensation intact distally  Incision: C/D/I, scant drainage    Lab Results  Component Value Date   WBC 10.8* 08/02/2014   HGB 9.2* 08/02/2014   HCT 27.4* 08/02/2014   MCV 88.7 08/02/2014   PLT 151 08/02/2014   BMET    Component Value Date/Time   NA 138 07/31/2014 0404   K 4.4 07/31/2014 0404   CL 103 07/31/2014 0404   CO2 25 07/31/2014 0404   GLUCOSE 100* 07/31/2014 0404   BUN 34* 07/31/2014 0404   CREATININE 0.71 07/31/2014 0404   CREATININE 0.76 05/01/2011 0945   CALCIUM 8.6 07/31/2014 0404   GFRNONAA 78* 07/31/2014 0404   GFRAA 90* 07/31/2014 0404     Assessment/Plan: 6 Days Post-Op   Principal Problem:   Osteoarthritis of right knee Active Problems:   Knee osteoarthritis   Anemia due to acute blood loss   Delirium, drug-induced   Atelectasis  GI Bleed with anemia and positive hemoccult  Up with therapy WBAT Dry dressing PRN  Anemia due to Acute blood loss and apparent GI Bleed: Continue to monitor Hgb.  Appears to have improved after d/c of Xarelto.  Hemoccult stool Positive.    Pulmonary infiltrate vs PNA: CXR 8/2 showed No evidence of acute cardiopulmonary disease.  Will continue rocephin for now, but CXR significantly improved.  Now normal, with minimal O2 requirement.    DVT Prophylaxis: Continue to hold Xarelto.  Continue SCDs and ambulation due to GI blood  loss.  Will get GI consult, hold SNF discharge at least until tomorrow.  Recheck h/h in am.  Remonia Richter 08/02/2014, 8:08 AM  Discussed and agree with above.  Marchia Bond, MD Cell [phone]

## 2014-08-02 NOTE — Consult Note (Signed)
Unassigned patient Reason for Consult: Heme positive stools and anemia. Referring Physician: Marchia Bond, MD  Christine Leblanc is an 80 y.o. female.  HPI: 80 year old white female, with multiple medical problems listed below, had a TKR on the right side on 07/27/14 and developed anemia after surgery requiring blood transfusions. There was reportedly no evidence of overt GI bleeding. She was however found to be hem positive on DRE. Her hemoglobin has been relatively stable since she was taken off the Xarelto. She denies having any abdominal pain, nausea or vomiting; she has occasional constipation. She has been followed by Dr. Barney Drain in [place]. She had an EGD done in 2010 that revealed gastritis with a small hiatal hernia and gastric polyps. She has occasional dysphagia for solids and wants to have her GI workup with her primary GI in [place]. She has a colonoscopy in her 76's but never after that.   Past Medical History  Diagnosis Date  . Syncope and collapse 2007    Possible CVA in 2012 with left lower extremity weakness; refused hospitalization; CT-Atrophy and chronic microvascular ischemic change.   . Asthma   . Gastroesophageal reflux disease     Hiatal hernia; distal esophageal web requiring dilatation; gastric polyps; gastritis; refuses colonoscopy  . Lymphocytic thyroiditis   . Multiple thyroid nodules 2010    Adenomatous; thyroidectomy in 2010  . Hyperlipidemia 04/27/2011  . Anemia   . GERD (gastroesophageal reflux disease)   . Chronic neck pain   . Chronic back pain   . Pneumonia     hosp. for pneumonia- long time ago   . Hypothyroidism   . Seizures     yes- as a child- & into adult years, states she took med. for them at one time, stopped at 32 yrs. of age   . Degenerative joint disease     Left shoulder; cervical spine, knees & hands   . Cancer     skin Ca- ? basal cell   . History of stress test 1990's    stress test done under the care of Dr. Lattie Haw & Dr.  Gwenlyn Found, now being followed by Dr. Harl Bowie- in [place] , recently seen & told to f/U in one yr.   . Hypertension   . Osteoarthritis of right knee 07/27/2014   Past Surgical History  Procedure Laterality Date  . Abdominal hysterectomy      fibroids  . Prolapsed uterine fibroid ligation      outcomed with rectocele & cystocele  . Total thyroidectomy  2010  . Esophagogastroduodenoscopy  12/10    small hiatal ernia/gastric polyps/mild gastritis  . Cataract extraction      Bilateral; redo surgery on the right for incomplete primary procedure  . Colonoscopy  Remote  . Shoulder surgery      left  . Total knee arthroplasty Right 07/27/2014    Procedure: RIGHT TOTAL KNEE ARTHROPLASTY;  Surgeon: Johnny Bridge, MD;  Location: [facility];  Service: Orthopedics;  Laterality: Right;   Family History  Problem Relation Age of Onset  . Heart disease Mother   . Gallbladder disease Mother   . Breast cancer Sister   . Colon cancer Neg Hx    Social History:  reports that she quit smoking about 35 years ago. Her smoking use included Cigarettes. She has a 16 pack-year smoking history. She has never used smokeless tobacco. She reports that she does not drink alcohol or use illicit drugs.  Allergies:  Allergies  Allergen Reactions  .  Celecoxib Shortness Of Breath   Medications: I have reviewed the patient's current medications.  Results for orders placed during the hospital encounter of 07/27/14 (from the past 48 hour(s))  CBC     Status: Abnormal   Collection Time    08/01/14  3:07 AM      Result Value Ref Range   WBC 8.9  4.0 - 10.5 K/uL   RBC 2.52 (*) 3.87 - 5.11 MIL/uL   Hemoglobin 7.5 (*) 12.0 - 15.0 g/dL   HCT 22.2 (*) 36.0 - 46.0 %   MCV 88.1  78.0 - 100.0 fL   MCH 29.8  26.0 - 34.0 pg   MCHC 33.8  30.0 - 36.0 g/dL   RDW 14.4  11.5 - 15.5 %   Platelets 162  150 - 400 K/uL  PREPARE RBC (CROSSMATCH)     Status: None   Collection Time    08/01/14 10:03 AM      Result Value Ref Range    Order Confirmation ORDER PROCESSED BY BLOOD BANK    OCCULT BLOOD X 1 CARD TO LAB, STOOL     Status: Abnormal   Collection Time    08/01/14  5:43 PM      Result Value Ref Range   Fecal Occult Bld POSITIVE (*) NEGATIVE  CBC     Status: Abnormal   Collection Time    08/02/14  5:35 AM      Result Value Ref Range   WBC 10.8 (*) 4.0 - 10.5 K/uL   RBC 3.09 (*) 3.87 - 5.11 MIL/uL   Hemoglobin 9.2 (*) 12.0 - 15.0 g/dL   Comment: DELTA CHECK NOTED     REPEATED TO VERIFY   HCT 27.4 (*) 36.0 - 46.0 %   MCV 88.7  78.0 - 100.0 fL   MCH 29.8  26.0 - 34.0 pg   MCHC 33.6  30.0 - 36.0 g/dL   RDW 14.2  11.5 - 15.5 %   Platelets 151  150 - 400 K/uL   Dg Chest 2 View  08/01/2014   CLINICAL DATA:  Evaluate infiltrate versus pneumonia  EXAM: CHEST  2 VIEW  COMPARISON:  07/30/2014  FINDINGS: Chronic interstitial markings/emphysematous changes. No focal consolidation. No pleural effusion or pneumothorax.  The heart is top-normal in size.  Left shoulder arthroplasty.  IMPRESSION: No evidence of acute cardiopulmonary disease.   Electronically Signed   By: Julian Hy M.D.   On: 08/01/2014 11:05   Review of Systems  Constitutional: Positive for malaise/fatigue. Negative for fever, chills and diaphoresis.  HENT: Negative.   Eyes: Negative.   Respiratory: Negative.   Cardiovascular: Negative.   Gastrointestinal: Positive for heartburn and constipation.  Genitourinary: Negative.   Musculoskeletal: Positive for joint pain.  Neurological: Positive for weakness.   Blood pressure 131/50, pulse 92, temperature 98 F (36.7 C), temperature source Oral, resp. rate 16, height 5\' 2"  (1.575 m), weight 58.514 kg (129 lb), SpO2 95.00%. Physical Exam  Constitutional: She is oriented to person, place, and time.  Very thin, elderly white female  HENT:  Head: Normocephalic and atraumatic.  Eyes: Conjunctivae are normal. Pupils are equal, round, and reactive to light.  Neck: Normal range of motion. Neck supple.   Cardiovascular: Normal rate and regular rhythm.   Respiratory: Effort normal and breath sounds normal.  GI: Soft. Bowel sounds are normal. She exhibits no distension and no mass. There is no tenderness. There is no rebound and no guarding.  Neurological: She is alert and oriented  to person, place, and time.  Skin: Skin is warm.  Psychiatric: She has a normal mood and affect. Her behavior is normal. Judgment and thought content normal.   Assessment/Plan: 1) Anemia with guaiac positive stools: As per my discussion with Dr. Mardelle Matte, I feel the patient can be started on Aspirin prior to discharge and she should follow up with Dr. Oneida Alar as soon as she can. I have let a message with Dr. Nona Dell office-I spoke to Ginger, she told me Dr. Oneida Alar was at the hospital and I advised her to let her know about the need for further workup on this patient.  2) GERD on PPI's.     Miranda Garber 08/02/2014, 4:29 PM

## 2014-08-03 ENCOUNTER — Inpatient Hospital Stay
Admission: RE | Admit: 2014-08-03 | Discharge: 2014-08-13 | Disposition: A | Discharge status: Home / Self Care | Payer: Medicare Other | Source: Ambulatory Visit | Attending: Internal Medicine | Admitting: Internal Medicine

## 2014-08-03 DIAGNOSIS — I10 Essential (primary) hypertension: Secondary | ICD-10-CM | POA: Diagnosis not present

## 2014-08-03 DIAGNOSIS — Z96659 Presence of unspecified artificial knee joint: Secondary | ICD-10-CM | POA: Diagnosis not present

## 2014-08-03 DIAGNOSIS — K219 Gastro-esophageal reflux disease without esophagitis: Secondary | ICD-10-CM | POA: Diagnosis not present

## 2014-08-03 DIAGNOSIS — D62 Acute posthemorrhagic anemia: Secondary | ICD-10-CM | POA: Diagnosis not present

## 2014-08-03 DIAGNOSIS — M1710 Unilateral primary osteoarthritis, unspecified knee: Secondary | ICD-10-CM | POA: Diagnosis not present

## 2014-08-03 DIAGNOSIS — D508 Other iron deficiency anemias: Secondary | ICD-10-CM | POA: Diagnosis not present

## 2014-08-03 DIAGNOSIS — S99929A Unspecified injury of unspecified foot, initial encounter: Secondary | ICD-10-CM | POA: Diagnosis not present

## 2014-08-03 DIAGNOSIS — R279 Unspecified lack of coordination: Secondary | ICD-10-CM | POA: Diagnosis not present

## 2014-08-03 DIAGNOSIS — R262 Difficulty in walking, not elsewhere classified: Secondary | ICD-10-CM | POA: Diagnosis not present

## 2014-08-03 DIAGNOSIS — E039 Hypothyroidism, unspecified: Secondary | ICD-10-CM | POA: Diagnosis not present

## 2014-08-03 DIAGNOSIS — M25569 Pain in unspecified knee: Secondary | ICD-10-CM | POA: Diagnosis not present

## 2014-08-03 DIAGNOSIS — Z471 Aftercare following joint replacement surgery: Secondary | ICD-10-CM | POA: Diagnosis not present

## 2014-08-03 DIAGNOSIS — R41841 Cognitive communication deficit: Secondary | ICD-10-CM | POA: Diagnosis not present

## 2014-08-03 DIAGNOSIS — M1990 Unspecified osteoarthritis, unspecified site: Secondary | ICD-10-CM | POA: Diagnosis not present

## 2014-08-03 DIAGNOSIS — M6281 Muscle weakness (generalized): Secondary | ICD-10-CM | POA: Diagnosis not present

## 2014-08-03 DIAGNOSIS — S8990XA Unspecified injury of unspecified lower leg, initial encounter: Secondary | ICD-10-CM | POA: Diagnosis not present

## 2014-08-03 LAB — CBC
HCT: 28 % — ABNORMAL LOW (ref 36.0–46.0)
Hemoglobin: 9.2 g/dL — ABNORMAL LOW (ref 12.0–15.0)
MCH: 29 pg (ref 26.0–34.0)
MCHC: 32.9 g/dL (ref 30.0–36.0)
MCV: 88.3 fL (ref 78.0–100.0)
Platelets: 179 K/uL (ref 150–400)
RBC: 3.17 MIL/uL — ABNORMAL LOW (ref 3.87–5.11)
RDW: 13.7 % (ref 11.5–15.5)
WBC: 11.9 K/uL — ABNORMAL HIGH (ref 4.0–10.5)

## 2014-08-03 MED ORDER — HYDROCODONE-ACETAMINOPHEN 5-325 MG PO TABS: 1.0000 | ORAL_TABLET | Freq: Four times a day (QID) | ORAL | Status: DC | PRN

## 2014-08-03 MED ORDER — ACETAMINOPHEN 325 MG PO TABS: 650.0000 mg | ORAL_TABLET | Freq: Four times a day (QID) | ORAL | Status: DC | PRN

## 2014-08-03 MED ORDER — FLUCONAZOLE 150 MG PO TABS
150.0000 mg | ORAL_TABLET | Freq: Once | ORAL | Status: AC
  Administered 2014-08-03: 150 mg via ORAL
  Filled 2014-08-03: qty 1

## 2014-08-03 MED ORDER — FLEET ENEMA 7-19 GM/118ML RE ENEM: 1.0000 | ENEMA | Freq: Every day | RECTAL | Status: DC | PRN

## 2014-08-03 MED ORDER — ASPIRIN 325 MG PO TABS
325.0000 mg | ORAL_TABLET | Freq: Two times a day (BID) | ORAL | Status: DC
  Administered 2014-08-03: 325 mg via ORAL
  Filled 2014-08-03 (×2): qty 1

## 2014-08-03 MED ORDER — FLUCONAZOLE 50 MG PO TABS: 50.0000 mg | ORAL_TABLET | Freq: Every day | ORAL | Status: DC

## 2014-08-03 MED ORDER — ASPIRIN 325 MG PO TABS
325.0000 mg | ORAL_TABLET | Freq: Two times a day (BID) | ORAL | Status: DC
Start: 2014-08-03 — End: 2015-02-01

## 2014-08-03 NOTE — Progress Notes (Signed)
Physical Therapy Treatment Patient Details Name: Christine Leblanc MRN: [id number] DOB: Jan 05, 1929 Today's Date: 08/03/2014    History of Present Illness Pt admitted for R TKA.     PT Comments    Patient progressing towards goals. Anticipating transfer to [facility] today.   Follow Up Recommendations  SNF     Equipment Recommendations  None recommended by PT    Recommendations for Other Services       Precautions / Restrictions Precautions Precautions: Knee;Fall Knee Immobilizer - Right: Discontinue once straight leg raise with < 10 degree lag Restrictions RLE Weight Bearing: Weight bearing as tolerated    Mobility  Bed Mobility           Sit to supine: Supervision   General bed mobility comments: Cues for positioning  Transfers   Equipment used: Rolling walker (2 wheeled) Transfers: Sit to/from Stand Sit to Stand: Min guard         General transfer comment: cues for hand placement, use of UE's for controlled descnt & positioning of RLE with stand>sit.    Ambulation/Gait Ambulation/Gait assistance: Supervision Ambulation Distance (Feet): 150 Feet Assistive device: Rolling walker (2 wheeled) Gait Pattern/deviations: Step-through pattern;Decreased stride length Gait velocity: decreased   General Gait Details: Cues for posture and safe use of RW especially with turning   Stairs            Wheelchair Mobility    Modified Rankin (Stroke Patients Only)       Balance                                    Cognition Arousal/Alertness: Awake/alert Behavior During Therapy: WFL for tasks assessed/performed Overall Cognitive Status: Within Functional Limits for tasks assessed                      Exercises Total Joint Exercises Quad Sets: AROM;Strengthening;Both;15 reps Heel Slides: Strengthening;15 reps;AROM;Right Hip ABduction/ADduction: AROM;Strengthening;Right;15 reps Straight Leg Raises: AROM;Right;15  reps;Strengthening Long Arc Quad: Strengthening;Right;AROM;15 reps    General Comments        Pertinent Vitals/Pain no apparent distress     Home Living                      Prior Function            PT Goals (current goals can now be found in the care plan section) Progress towards PT goals: Progressing toward goals    Frequency  7X/week    PT Plan Current plan remains appropriate    Co-evaluation             End of Session Equipment Utilized During Treatment: Gait belt Activity Tolerance: Patient tolerated treatment well Patient left: in chair;with call bell/phone within reach     Time: 0806-0832 PT Time Calculation (min): 26 min  Charges:  $Gait Training: 8-22 mins $Therapeutic Exercise: 8-22 mins                    G Codes:      Jacqualyn Posey 08/03/2014, 8:59 AM  08/03/2014 Jacqualyn Posey PTA [phone] pager [phone] office

## 2014-08-03 NOTE — Progress Notes (Signed)
     Subjective:  Feeling well overall.  Patient reports pain as mild.  Nausea last night that has resolved.  No cough, CP, or SOB.  Objective:   VITALS:   Filed Vitals:   08/02/14 2000 08/02/14 2100 08/03/14 0000 08/03/14 0549  BP:  158/78  137/60  Pulse:  104  86  Temp:  97.8 F (36.6 C)  97.2 F (36.2 C)  TempSrc:      Resp: 16  16 16   Height:      Weight:      SpO2: 93% 96% 96% 92%   Awake, alert and oriented x3. ABD soft Neurovascular intact Sensation intact distally Intact pulses distally Dorsiflexion/Plantar flexion intact Incision: scant drainage    BMET    Component Value Date/Time   NA 138 07/31/2014 0404   K 4.4 07/31/2014 0404   CL 103 07/31/2014 0404   CO2 25 07/31/2014 0404   GLUCOSE 100* 07/31/2014 0404   BUN 34* 07/31/2014 0404   CREATININE 0.71 07/31/2014 0404   CREATININE 0.76 05/01/2011 0945   CALCIUM 8.6 07/31/2014 0404   GFRNONAA 78* 07/31/2014 0404   GFRAA 90* 07/31/2014 0404     Assessment/Plan: 7 Days Post-Op   Principal Problem:   Osteoarthritis of right knee Active Problems:   Knee osteoarthritis   Anemia due to acute blood loss   Delirium, drug-induced   Atelectasis Vaginal Candidiasis  Up with therapy WBAT Dry Dressing PRN  Hgb stable at 9.2 (8/3-8/4).  D/C Xarelto.  Continue SCDs, ASA.  GI consulted.  Will follow up with GI outpatient.  Pulmonary infiltrated vs PNA: CXR clear on 8/2.  Symptoms resolved.  O2 sats >92 on Room Air.  Vaginal Candidiasis: single dose diflucan  Plan SNF today.  Lemar Lofty, PA-S  Remonia Richter 08/03/2014, 7:33 AM  See Discharge Summary Marchia Bond, MD Cell [phone]

## 2014-08-03 NOTE — Discharge Summary (Addendum)
Physician Discharge Summary  Patient ID: Christine Leblanc MRN: [id number] DOB/AGE: 78-Apr-1930 78 y.o.  Admit date: 07/27/2014 Discharge date: 08/03/2014  Admission Diagnoses:  Osteoarthritis of right knee  Discharge Diagnoses:  Principal Problem:   Osteoarthritis of right knee Active Problems:   Knee osteoarthritis   Anemia due to acute blood loss   Delirium, drug-induced   Atelectasis  GI bleed Mild acute renal insufficiency Vaginal candidiasis  Past Medical History  Diagnosis Date  . Syncope and collapse 2007    Possible CVA in 2012 with left lower extremity weakness; refused hospitalization; CT-Atrophy and chronic microvascular ischemic change.   . Asthma   . Gastroesophageal reflux disease     Hiatal hernia; distal esophageal web requiring dilatation; gastric polyps; gastritis; refuses colonoscopy  . Lymphocytic thyroiditis   . Multiple thyroid nodules 2010    Adenomatous; thyroidectomy in 2010  . Hyperlipidemia 04/27/2011  . Anemia   . GERD (gastroesophageal reflux disease)   . Chronic neck pain   . Chronic back pain   . Pneumonia     hosp. for pneumonia- long time ago   . Hypothyroidism   . Seizures     yes- as a child- & into adult years, states she took med. for them at one time, stopped at 32 yrs. of age   . Degenerative joint disease     Left shoulder; cervical spine, knees & hands   . Cancer     skin Ca- ? basal cell   . History of stress test 1990's    stress test done under the care of Dr. Lattie Haw & Dr. Gwenlyn Found, now being followed by Dr. Harl Bowie- in [place] , recently seen & told to f/U in one yr.   . Hypertension   . Osteoarthritis of right knee 07/27/2014    Surgeries: Procedure(s): RIGHT TOTAL KNEE ARTHROPLASTY on 07/27/2014   Consultants (if any):   gastroenterology  Discharged Condition: Improved  Hospital Course: Christine Leblanc is an 80 y.o. female who was admitted 07/27/2014 with a diagnosis of Osteoarthritis of right knee and went to the  operating room on 07/27/2014 and underwent the above named procedures.    She was given perioperative antibiotics:      Anti-infectives   Start     Dose/Rate Route Frequency Ordered Stop   08/03/14 1000  fluconazole (DIFLUCAN) tablet 50 mg  Status:  Discontinued     50 mg Oral Daily 08/03/14 0738 08/03/14 0742   08/03/14 0745  fluconazole (DIFLUCAN) tablet 150 mg     150 mg Oral  Once 08/03/14 0742 08/03/14 0932   07/31/14 1000  cefTRIAXone (ROCEPHIN) 1 g in dextrose 5 % 50 mL IVPB  Status:  Discontinued     1 g 100 mL/hr over 30 Minutes Intravenous Every 24 hours 07/31/14 0952 08/03/14 0736   07/27/14 1400  ceFAZolin (ANCEF) IVPB 2 g/50 mL premix     2 g 100 mL/hr over 30 Minutes Intravenous Every 6 hours 07/27/14 1257 07/27/14 2054   07/27/14 0701  ceFAZolin (ANCEF) IVPB 2 g/50 mL premix     2 g 100 mL/hr over 30 Minutes Intravenous 30 min pre-op 07/27/14 0702 07/27/14 0804   07/27/14 0600  clindamycin (CLEOCIN) IVPB 900 mg     900 mg 100 mL/hr over 30 Minutes Intravenous On call to O.R. 07/26/14 1434 07/27/14 0756    .  She was given sequential compression devices, early ambulation, and xarelto for DVT prophylaxis. She did however end up having a Hemoccult-positive  stool, with persistent drop in hemoglobin, diagnosed in directly with a GI bleed, gastroenterology consult requested, and they recommended avoidance of the xarelto, and use of aspirin alone 325 mg twice a day for DVT prophylaxis.  Early in her postoperative state, she had an episode of excessive pain medications, in part because of family insistance on keeping her comfortable, which required Narcan.  She seemed to get somewhat fluid overloaded, had hypoxia, with respiratory compromise, with atelectasis versus infiltrate on chest x-ray. She had a speech/swallow evaluation performed, which was adequate for her to continue taking by mouth. I gave her Lasix, and I diuresed her, and ultimately her chest x-ray cleared. She did have  a productive cough during that time period, and I placed her on Rocephin in order to make sure that she did not develop a pneumonia. She never had fevers or a white blood cell count elevation. Ultimately her cough and her shortness of breath and her hypoxia cleared, and her chest x-ray normalized.  She did however develop what appeared to be a vaginal candidiasis, which I treated with a single dose of Diflucan.  During the Lasix administration, her creatinine did increase slightly to 1.3, which then normalized thereafter.  She did have acute blood loss anemia, combined with a GI bleed, and required a total of 3 units of packed red blood cells.  She also had significant constipation, requiring an extensive bowel regimen including fleets enemas.  She made steady progress from an orthopedic standpoint and was walking 80 feet at the time of discharge.  She will plan to followup with her primary gastroenterologist and her primary care doctor as well.  She benefited maximally from the hospital stay and there were no orthopedic complications, although she did have a fairly challenging medical course.    Recent vital signs:  Filed Vitals:   08/03/14 0549  BP: 137/60  Pulse: 86  Temp: 97.2 F (36.2 C)  Resp: 16    Recent laboratory studies:  Lab Results  Component Value Date   HGB 9.2* 08/03/2014   HGB 9.2* 08/02/2014   HGB 7.5* 08/01/2014   Lab Results  Component Value Date   WBC 11.9* 08/03/2014   PLT 179 08/03/2014   Lab Results  Component Value Date   INR 1.08 07/19/2014   Lab Results  Component Value Date   NA 138 07/31/2014   K 4.4 07/31/2014   CL 103 07/31/2014   CO2 25 07/31/2014   BUN 34* 07/31/2014   CREATININE 0.71 07/31/2014   GLUCOSE 100* 07/31/2014    Discharge Medications:     Medication List    STOP taking these medications       aspirin EC 81 MG tablet  Replaced by:  aspirin 325 MG tablet     etodolac 300 MG capsule  Commonly known as:  LODINE      TAKE these  medications       acetaminophen 325 MG tablet  Commonly known as:  TYLENOL  Take 2 tablets (650 mg total) by mouth every 6 (six) hours as needed for mild pain (or Fever >/= 101). Total tylenol dose per 24 hrs including norco not to exceed 3,500 mg.     aspirin 325 MG tablet  Take 1 tablet (325 mg total) by mouth 2 (two) times daily.     baclofen 10 MG tablet  Commonly known as:  LIORESAL  Take 1 tablet (10 mg total) by mouth 3 (three) times daily. As needed for muscle spasm  calcium-vitamin D 500-200 MG-UNIT per tablet  Commonly known as:  OSCAL WITH D  Take 1 tablet by mouth 2 (two) times daily.     DEXILANT 60 MG capsule  Generic drug:  dexlansoprazole  Take 60 mg by mouth daily before breakfast.     diazepam 5 MG tablet  Commonly known as:  VALIUM  Take 5 mg by mouth daily as needed. Sleep     Fish Oil 1200 MG Caps  Take 1 capsule by mouth 2 (two) times daily.     GLUCOSAMINE CHONDR 1500 COMPLX PO  Take 1 tablet by mouth 2 (two) times daily. INCLUDES: Chondroitin 1200mg  (1.2 g)     HYDROcodone-acetaminophen 5-325 MG per tablet  Commonly known as:  NORCO/VICODIN  Take 1-2 tablets by mouth every 6 (six) hours as needed for moderate pain.     levocetirizine 5 MG tablet  Commonly known as:  XYZAL  Take 5 mg by mouth every evening.     levothyroxine 100 MCG tablet  Commonly known as:  SYNTHROID, LEVOTHROID  Take 100 mcg by mouth daily before breakfast.     montelukast 10 MG tablet  Commonly known as:  SINGULAIR  Take 10 mg by mouth at bedtime.     ondansetron 4 MG tablet  Commonly known as:  ZOFRAN  Take 1 tablet (4 mg total) by mouth every 8 (eight) hours as needed for nausea or vomiting.     PERI-COLACE PO  Take 1 tablet by mouth 2 (two) times daily.     pravastatin 80 MG tablet  Commonly known as:  PRAVACHOL  Take 80 mg by mouth daily.     PROVENTIL HFA 108 (90 BASE) MCG/ACT inhaler  Generic drug:  albuterol  Inhale 2 puffs into the lungs every 6 (six)  hours as needed. Wheezing, Asthma Symptoms     pyridOXINE 100 MG tablet  Commonly known as:  VITAMIN B-6  Take 100 mg by mouth at bedtime.     sodium phosphate 7-19 GM/118ML Enem  Place 133 mLs (1 enema total) rectally daily as needed for severe constipation.     vitamin B-12 1000 MCG tablet  Commonly known as:  CYANOCOBALAMIN  Take 1,000 mcg by mouth every morning.     vitamin E 400 UNIT capsule  Take 400 Units by mouth at bedtime.        Diagnostic Studies: Dg Chest 2 View  08/01/2014   CLINICAL DATA:  Evaluate infiltrate versus pneumonia  EXAM: CHEST  2 VIEW  COMPARISON:  07/30/2014  FINDINGS: Chronic interstitial markings/emphysematous changes. No focal consolidation. No pleural effusion or pneumothorax.  The heart is top-normal in size.  Left shoulder arthroplasty.  IMPRESSION: No evidence of acute cardiopulmonary disease.   Electronically Signed   By: Julian Hy M.D.   On: 08/01/2014 11:05   Dg Chest 2 View  07/30/2014   CLINICAL DATA:  Cough.  EXAM: CHEST  2 VIEW  COMPARISON:  07/29/2014.  FINDINGS: Mediastinum and hilar structures normal. Left base atelectasis and/or infiltrate again noted. Stable cardiomegaly with normal pulmonary vascularity. No acute bony abnormality. Left shoulder replacement.  IMPRESSION: Left base atelectasis and/or mild infiltrate again noted.   Electronically Signed   By: Marcello Moores  Register   On: 07/30/2014 07:48   Dg Chest 2 View  07/19/2014   CLINICAL DATA:  Preop films for knee surgery. No chest complaints. Hypertension. Remote history of smoking.  EXAM: CHEST  2 VIEW  COMPARISON:  02/06/2013  FINDINGS: Moderate osteopenia. Left  shoulder arthroplasty. Midline trachea. Normal heart size. Tortuous thoracic aorta with atherosclerosis within. Biapical pleural parenchymal scarring. No pleural effusion or pneumothorax. Clear lungs.  IMPRESSION: No acute cardiopulmonary disease.   Electronically Signed   By: Abigail Miyamoto M.D.   On: 07/19/2014 14:12   Dg  Chest Port 1 View  07/29/2014   CLINICAL DATA:  Shortness of breath.  EXAM: PORTABLE CHEST - 1 VIEW  COMPARISON:  07/19/2014 and 02/06/2013.  FINDINGS: 1633 hr. The heart size and mediastinal contours are stable. There are lower lung volumes with new patchy left lower lobe airspace disease. Minimal patchy right basilar opacity probably reflects atelectasis. There is stable scarring in both lung apices. No edema or significant pleural effusion is seen. Patient is status post left shoulder arthroplasty. No acute osseous findings demonstrated.  IMPRESSION: Lower lung volumes with new left greater than right basilar pulmonary opacities. These may reflect atelectasis, although left lower lobe aspiration or infiltrate cannot be excluded.   Electronically Signed   By: Camie Patience M.D.   On: 07/29/2014 16:44   Dg Knee Right Port  07/27/2014   CLINICAL DATA:  RIGHT knee arthroplasty  EXAM: PORTABLE RIGHT KNEE - 1-2 VIEW  COMPARISON:  Portable exam 1145 hr without priors for comparison.  FINDINGS: Diffuse osseous demineralization.  Components of RIGHT knee prosthesis identified.  No acute fracture dislocation.  No periprosthetic lucency.  Knee joint effusion and small amount of air within joint space.  Regional soft tissue swelling consistent with postsurgical change.  IMPRESSION: Post RIGHT knee arthroplasty.  No acute abnormalities.   Electronically Signed   By: Lavonia Dana M.D.   On: 07/27/2014 12:01    Disposition: 01-Home or Self Care  Discharge Instructions   Weight bearing as tolerated    Complete by:  As directed            Follow-up Information   Follow up with Johnny Bridge, MD. Schedule an appointment as soon as possible for a visit in 2 weeks.   Specialty:  Orthopedic Surgery   Contact information:   [address] [place] [phone]      She should also followup with her primary care doctor and your gastroenterologist within the next  week.  SignedJohnny Bridge 08/03/2014, 10:21 AM

## 2014-08-03 NOTE — Progress Notes (Signed)
Patient will be discharged today to [facility] for further rehab. MD wrote script for Diflucan as patient does have yeast under her folds on abdomen. Family is aware of transfer to the facility via non-emergent transport. Awaiting social work for transport details.

## 2014-08-03 NOTE — Discharge Planning (Signed)
Pt to transfer to: [facility], SNF RN to call report to: [phone] Transportation to be provided by: [facility]  CSW spoke with daughter, Maudie [place] to relay [employer].  Kim agreeable.  CSW has called for transportation.  RN aware.  Nonnie Done, [place] [phone]  Clinical Social Work

## 2014-08-04 ENCOUNTER — Non-Acute Institutional Stay (SKILLED_NURSING_FACILITY): Payer: Medicare Other | Admitting: Internal Medicine

## 2014-08-04 ENCOUNTER — Other Ambulatory Visit: Payer: Self-pay | Admitting: *Deleted

## 2014-08-04 DIAGNOSIS — E039 Hypothyroidism, unspecified: Secondary | ICD-10-CM | POA: Diagnosis not present

## 2014-08-04 DIAGNOSIS — D62 Acute posthemorrhagic anemia: Secondary | ICD-10-CM

## 2014-08-04 DIAGNOSIS — K219 Gastro-esophageal reflux disease without esophagitis: Secondary | ICD-10-CM | POA: Diagnosis not present

## 2014-08-04 DIAGNOSIS — M1710 Unilateral primary osteoarthritis, unspecified knee: Secondary | ICD-10-CM

## 2014-08-04 DIAGNOSIS — M1711 Unilateral primary osteoarthritis, right knee: Secondary | ICD-10-CM

## 2014-08-04 MED ORDER — DIAZEPAM 5 MG PO TABS: ORAL_TABLET | ORAL | Status: DC

## 2014-08-04 MED ORDER — HYDROCODONE-ACETAMINOPHEN 5-325 MG PO TABS: ORAL_TABLET | ORAL | Status: DC

## 2014-08-04 NOTE — Telephone Encounter (Signed)
[facility] 

## 2014-08-06 NOTE — Progress Notes (Signed)
HISTORY & PHYSICAL  DATE: 08/04/2014   FACILITY: [facility]  LEVEL OF CARE: SNF (31)  ALLERGIES:  Allergies  Allergen Reactions  . Celecoxib Shortness Of Breath    CHIEF COMPLAINT:  Manage right knee osteoarthritis, acute blood loss anemia and hypothyroidism  HISTORY OF PRESENT ILLNESS: Patient is an 80 year old Caucasian female.  KNEE OSTEOARTHRITIS: Patient had a history of pain and functional disability in the knee due to end-stage osteoarthritis and has failed nonsurgical conservative treatments. Patient had worsening of pain with activity and weight bearing, pain that interfered with activities of daily living & pain with passive range of motion. Therefore patient underwent total knee arthroplasty and tolerated the procedure well. Patient is admitted to this facility for sort short-term rehabilitation. Patient denies knee pain.  ANEMIA: The anemia has been stable. The patient denies fatigue, melena or hematochezia. No complications from the medications currently being used. Postoperatively the patient suffered acute blood loss. She required 3 units of red blood cell transfusion. Last hemoglobins are 9.2, 9.2 and 7.5  HYPOTHYROIDISM: The hypothyroidism remains stable. No complications noted from the medications presently being used.  The patient denies fatigue or constipation.  Last TSH not available.  PAST MEDICAL HISTORY :  Past Medical History  Diagnosis Date  . Syncope and collapse 2007    Possible CVA in 2012 with left lower extremity weakness; refused hospitalization; CT-Atrophy and chronic microvascular ischemic change.   . Asthma   . Gastroesophageal reflux disease     Hiatal hernia; distal esophageal web requiring dilatation; gastric polyps; gastritis; refuses colonoscopy  . Lymphocytic thyroiditis   . Multiple thyroid nodules 2010    Adenomatous; thyroidectomy in 2010  . Hyperlipidemia 04/27/2011  . Anemia   . GERD (gastroesophageal reflux  disease)   . Chronic neck pain   . Chronic back pain   . Pneumonia     hosp. for pneumonia- long time ago   . Hypothyroidism   . Seizures     yes- as a child- & into adult years, states she took med. for them at one time, stopped at 32 yrs. of age   . Degenerative joint disease     Left shoulder; cervical spine, knees & hands   . Cancer     skin Ca- ? basal cell   . History of stress test 1990's    stress test done under the care of Dr. Lattie Haw & Dr. Gwenlyn Found, now being followed by Dr. Harl Bowie- in [place] , recently seen & told to f/U in one yr.   . Hypertension   . Osteoarthritis of right knee 07/27/2014    PAST SURGICAL HISTORY: Past Surgical History  Procedure Laterality Date  . Abdominal hysterectomy      fibroids  . Prolapsed uterine fibroid ligation      outcomed with rectocele & cystocele  . Total thyroidectomy  2010  . Esophagogastroduodenoscopy  12/10    small hiatal ernia/gastric polyps/mild gastritis  . Cataract extraction      Bilateral; redo surgery on the right for incomplete primary procedure  . Colonoscopy  Remote  . Shoulder surgery      left  . Total knee arthroplasty Right 07/27/2014    Procedure: RIGHT TOTAL KNEE ARTHROPLASTY;  Surgeon: Johnny Bridge, MD;  Location: [facility];  Service: Orthopedics;  Laterality: Right;    SOCIAL HISTORY:  reports that she quit smoking about 35 years ago. Her smoking use included Cigarettes. She has a 16 pack-year  smoking history. She has never used smokeless tobacco. She reports that she does not drink alcohol or use illicit drugs.  FAMILY HISTORY:  Family History  Problem Relation Age of Onset  . Heart disease Mother   . Gallbladder disease Mother   . Breast cancer Sister   . Colon cancer Neg Hx     CURRENT MEDICATIONS: Reviewed per MAR/see medication list  REVIEW OF SYSTEMS:  See HPI otherwise 14 point ROS is negative.  PHYSICAL EXAMINATION  VS:  See VS section  GENERAL: no acute distress, normal body  habitus EYES: conjunctivae normal, sclerae normal, normal eye lids MOUTH/THROAT: lips without lesions,no lesions in the mouth,tongue is without lesions,uvula elevates in midline NECK: supple, trachea midline, no neck masses, no thyroid tenderness, no thyromegaly LYMPHATICS: no LAN in the neck, no supraclavicular LAN RESPIRATORY: breathing is even & unlabored, BS CTAB CARDIAC: RRR, no murmur,no extra heart sounds, no edema GI:  ABDOMEN: abdomen soft, normal BS, no masses, no tenderness  LIVER/SPLEEN: no hepatomegaly, no splenomegaly MUSCULOSKELETAL: HEAD: normal to inspection  EXTREMITIES: LEFT UPPER EXTREMITY: full range of motion, normal strength & tone RIGHT UPPER EXTREMITY:  full range of motion, normal strength & tone LEFT LOWER EXTREMITY:  full range of motion, normal strength & tone RIGHT LOWER EXTREMITY:  range of motion not tested due to surgery, normal strength & tone PSYCHIATRIC: the patient is alert & oriented to person, affect & behavior appropriate  LABS/RADIOLOGY:  Labs reviewed: Basic Metabolic Panel:  Recent Labs  07/28/14 0502 07/30/14 0538 07/31/14 0404  NA 140 137 138  K 4.4 4.8 4.4  CL 100 97 103  CO2 25 27 25   GLUCOSE 161* 141* 100*  BUN 22 44* 34*  CREATININE 0.94 1.23* 0.71  CALCIUM 8.9 8.9 8.6   CBC:  Recent Labs  08/01/14 0307 08/02/14 0535 08/03/14 0544  WBC 8.9 10.8* 11.9*  HGB 7.5* 9.2* 9.2*  HCT 22.2* 27.4* 28.0*  MCV 88.1 88.7 88.3  PLT 162 151 179   Lipid Panel:  Recent Labs  07/21/14 1031  HDL 55    PORTABLE RIGHT KNEE - 1-2 VIEW   COMPARISON:  Portable exam 1145 hr without priors for comparison.   FINDINGS: Diffuse osseous demineralization.   Components of RIGHT knee prosthesis identified.   No acute fracture dislocation.   No periprosthetic lucency.   Knee joint effusion and small amount of air within joint space.   Regional soft tissue swelling consistent with postsurgical change.   IMPRESSION: Post RIGHT  knee arthroplasty.   No acute abnormalities.   CHEST  2 VIEW   COMPARISON:  07/30/2014   FINDINGS: Chronic interstitial markings/emphysematous changes. No focal consolidation. No pleural effusion or pneumothorax.   The heart is top-normal in size.   Left shoulder arthroplasty.   IMPRESSION: No evidence of acute cardiopulmonary disease.    ASSESSMENT/PLAN:  Right knee osteoarthritis-status post right total knee arthroplasty. Continue rehabilitation. Acute blood loss anemia-status post transfusion. Recheck hemoglobin Hypothyroidism-continue levothyroxine GERD-continue PPI Asthma-compensated Hyperlipidemia-continue Pravachol Check CBC  I have reviewed patient's medical records received at admission/from hospitalization.  CPT CODE: 58099  Django Nguyen Y Tyjanae Bartek, [place] [phone]

## 2014-08-10 ENCOUNTER — Other Ambulatory Visit: Payer: Self-pay | Admitting: *Deleted

## 2014-08-10 MED ORDER — HYDROCODONE-ACETAMINOPHEN 10-325 MG PO TABS: ORAL_TABLET | ORAL | Status: DC

## 2014-08-10 NOTE — Telephone Encounter (Signed)
[facility] 

## 2014-08-11 ENCOUNTER — Non-Acute Institutional Stay (SKILLED_NURSING_FACILITY): Payer: Medicare Other | Admitting: Internal Medicine

## 2014-08-11 DIAGNOSIS — D62 Acute posthemorrhagic anemia: Secondary | ICD-10-CM | POA: Diagnosis not present

## 2014-08-11 DIAGNOSIS — Z471 Aftercare following joint replacement surgery: Secondary | ICD-10-CM | POA: Diagnosis not present

## 2014-08-11 DIAGNOSIS — I10 Essential (primary) hypertension: Secondary | ICD-10-CM

## 2014-08-11 DIAGNOSIS — Z96659 Presence of unspecified artificial knee joint: Secondary | ICD-10-CM | POA: Diagnosis not present

## 2014-08-11 MED ORDER — DEXLANSOPRAZOLE 60 MG PO CPDR: 60.0000 mg | DELAYED_RELEASE_CAPSULE | Freq: Every day | ORAL | Status: DC

## 2014-08-11 NOTE — Telephone Encounter (Signed)
I called to check on pt and she is at the [facility] for short term Rehab.  Came last week and should be there about 20 days.

## 2014-08-11 NOTE — Addendum Note (Signed)
Addended by: Danie Binder on: 08/11/2014 10:31 PM   Modules accepted: Orders

## 2014-08-11 NOTE — Telephone Encounter (Signed)
PLEASE CALL PT. I WILL REFILL DEXILANT FOR 6 MOS. SHE NEEDS TO MAKE AN APPT TO BE SEEN WITHIN THE NEXT 6 MOS OR SHE WILL BE UNABLE TO GET DEXILANT FORM DR. Vanecia Limpert.

## 2014-08-11 NOTE — Telephone Encounter (Signed)
REVIEWED.  PT REFUSED TCS IN 2013. LAST EGD 2010-CHRONIC GASTRITIS.

## 2014-08-12 NOTE — Telephone Encounter (Signed)
Her address was at the [facility] and I mailed it there.

## 2014-08-12 NOTE — Telephone Encounter (Signed)
Called the [facility] and spoke to Eagle and she will let pt's nurse know so she can inform pt.  I also am mailing the print out info to pt's home.

## 2014-08-12 NOTE — Progress Notes (Signed)
Patient ID: Christine Leblanc, female   DOB: 24-Sep-1929, 80 y.o.   MRN: [id number]           PROGRESS NOTE  DATE: 08/11/2014         FACILITY:  [facility]  LEVEL OF CARE: SNF (31)  Acute Visit  CHIEF COMPLAINT:  Manage acute blood loss anemia.    HISTORY OF PRESENT ILLNESS: I was requested by the staff to assess the patient regarding above problem(s):  ANEMIA: The anemia is unstable. The patient denies fatigue, melena or hematochezia. No complications from the medications currently being used.   On 08/09/2014:  Hemoglobin 9, MCV 91.4.  On 08/03/2014:  Hemoglobin 9.2.   Patient is status post right total knee replacement.    PAST MEDICAL HISTORY : Reviewed.  No changes/see problem list  CURRENT MEDICATIONS: Reviewed per MAR/see medication list  REVIEW OF SYSTEMS:  GENERAL: no change in appetite, no fatigue, no weight changes, no fever, chills or weakness RESPIRATORY: no cough, SOB, DOE,, wheezing, hemoptysis CARDIAC: no chest pain, edema or palpitations GI: no abdominal pain, diarrhea, constipation, heart burn, nausea or vomiting  PHYSICAL EXAMINATION  VS: see VS section  GENERAL: no acute distress, normal body habitus NECK: supple, trachea midline, no neck masses, no thyroid tenderness, no thyromegaly RESPIRATORY: breathing is even & unlabored, BS CTAB CARDIAC: RRR, no murmur,no extra heart sounds, right lower extremity has +1 edema         GI: abdomen soft, normal BS, no masses, no tenderness, no hepatomegaly, no splenomegaly PSYCHIATRIC: the patient is alert & oriented to person, affect & behavior appropriate  ASSESSMENT/PLAN:  Acute blood loss anemia.  Unstable problem.   Hemoglobin declined.  We will recheck on 08/16/2014.    Hypertension.  Patient's last blood pressure is elevated.  We will monitor.      CPT CODE: 23557          Athan Casalino Y Shalanda Brogden, [place] [phone]

## 2014-08-17 DIAGNOSIS — Z471 Aftercare following joint replacement surgery: Secondary | ICD-10-CM | POA: Diagnosis not present

## 2014-08-17 DIAGNOSIS — J45909 Unspecified asthma, uncomplicated: Secondary | ICD-10-CM | POA: Diagnosis not present

## 2014-08-17 DIAGNOSIS — Z96659 Presence of unspecified artificial knee joint: Secondary | ICD-10-CM | POA: Diagnosis not present

## 2014-08-17 DIAGNOSIS — R55 Syncope and collapse: Secondary | ICD-10-CM | POA: Diagnosis not present

## 2014-08-17 DIAGNOSIS — Z5189 Encounter for other specified aftercare: Secondary | ICD-10-CM | POA: Diagnosis not present

## 2014-08-18 ENCOUNTER — Telehealth (HOSPITAL_COMMUNITY): Payer: Self-pay

## 2014-08-18 DIAGNOSIS — Z96659 Presence of unspecified artificial knee joint: Secondary | ICD-10-CM | POA: Diagnosis not present

## 2014-08-18 DIAGNOSIS — R55 Syncope and collapse: Secondary | ICD-10-CM | POA: Diagnosis not present

## 2014-08-18 DIAGNOSIS — Z5189 Encounter for other specified aftercare: Secondary | ICD-10-CM | POA: Diagnosis not present

## 2014-08-18 DIAGNOSIS — J45909 Unspecified asthma, uncomplicated: Secondary | ICD-10-CM | POA: Diagnosis not present

## 2014-08-18 DIAGNOSIS — Z471 Aftercare following joint replacement surgery: Secondary | ICD-10-CM | POA: Diagnosis not present

## 2014-08-19 ENCOUNTER — Ambulatory Visit (HOSPITAL_COMMUNITY): Payer: Medicare Other | Admitting: Physical Therapy

## 2014-08-19 DIAGNOSIS — Z471 Aftercare following joint replacement surgery: Secondary | ICD-10-CM | POA: Diagnosis not present

## 2014-08-19 DIAGNOSIS — R55 Syncope and collapse: Secondary | ICD-10-CM | POA: Diagnosis not present

## 2014-08-19 DIAGNOSIS — Z5189 Encounter for other specified aftercare: Secondary | ICD-10-CM | POA: Diagnosis not present

## 2014-08-19 DIAGNOSIS — Z96659 Presence of unspecified artificial knee joint: Secondary | ICD-10-CM | POA: Diagnosis not present

## 2014-08-19 DIAGNOSIS — J45909 Unspecified asthma, uncomplicated: Secondary | ICD-10-CM | POA: Diagnosis not present

## 2014-08-20 DIAGNOSIS — Z96659 Presence of unspecified artificial knee joint: Secondary | ICD-10-CM | POA: Diagnosis not present

## 2014-08-20 DIAGNOSIS — Z5189 Encounter for other specified aftercare: Secondary | ICD-10-CM | POA: Diagnosis not present

## 2014-08-20 DIAGNOSIS — Z471 Aftercare following joint replacement surgery: Secondary | ICD-10-CM | POA: Diagnosis not present

## 2014-08-20 DIAGNOSIS — R55 Syncope and collapse: Secondary | ICD-10-CM | POA: Diagnosis not present

## 2014-08-20 DIAGNOSIS — J45909 Unspecified asthma, uncomplicated: Secondary | ICD-10-CM | POA: Diagnosis not present

## 2014-08-23 DIAGNOSIS — Z471 Aftercare following joint replacement surgery: Secondary | ICD-10-CM | POA: Diagnosis not present

## 2014-08-23 DIAGNOSIS — R55 Syncope and collapse: Secondary | ICD-10-CM | POA: Diagnosis not present

## 2014-08-23 DIAGNOSIS — J45909 Unspecified asthma, uncomplicated: Secondary | ICD-10-CM | POA: Diagnosis not present

## 2014-08-23 DIAGNOSIS — Z5189 Encounter for other specified aftercare: Secondary | ICD-10-CM | POA: Diagnosis not present

## 2014-08-23 DIAGNOSIS — Z96659 Presence of unspecified artificial knee joint: Secondary | ICD-10-CM | POA: Diagnosis not present

## 2014-08-24 DIAGNOSIS — R55 Syncope and collapse: Secondary | ICD-10-CM | POA: Diagnosis not present

## 2014-08-24 DIAGNOSIS — Z96659 Presence of unspecified artificial knee joint: Secondary | ICD-10-CM | POA: Diagnosis not present

## 2014-08-24 DIAGNOSIS — Z471 Aftercare following joint replacement surgery: Secondary | ICD-10-CM | POA: Diagnosis not present

## 2014-08-24 DIAGNOSIS — Z5189 Encounter for other specified aftercare: Secondary | ICD-10-CM | POA: Diagnosis not present

## 2014-08-24 DIAGNOSIS — J45909 Unspecified asthma, uncomplicated: Secondary | ICD-10-CM | POA: Diagnosis not present

## 2014-08-26 DIAGNOSIS — J45909 Unspecified asthma, uncomplicated: Secondary | ICD-10-CM | POA: Diagnosis not present

## 2014-08-26 DIAGNOSIS — Z96659 Presence of unspecified artificial knee joint: Secondary | ICD-10-CM | POA: Diagnosis not present

## 2014-08-26 DIAGNOSIS — Z471 Aftercare following joint replacement surgery: Secondary | ICD-10-CM | POA: Diagnosis not present

## 2014-08-26 DIAGNOSIS — R55 Syncope and collapse: Secondary | ICD-10-CM | POA: Diagnosis not present

## 2014-08-26 DIAGNOSIS — Z5189 Encounter for other specified aftercare: Secondary | ICD-10-CM | POA: Diagnosis not present

## 2014-08-27 DIAGNOSIS — Z5189 Encounter for other specified aftercare: Secondary | ICD-10-CM | POA: Diagnosis not present

## 2014-08-27 DIAGNOSIS — Z96659 Presence of unspecified artificial knee joint: Secondary | ICD-10-CM | POA: Diagnosis not present

## 2014-08-27 DIAGNOSIS — Z471 Aftercare following joint replacement surgery: Secondary | ICD-10-CM | POA: Diagnosis not present

## 2014-08-27 DIAGNOSIS — J45909 Unspecified asthma, uncomplicated: Secondary | ICD-10-CM | POA: Diagnosis not present

## 2014-08-27 DIAGNOSIS — R55 Syncope and collapse: Secondary | ICD-10-CM | POA: Diagnosis not present

## 2014-08-30 DIAGNOSIS — J45909 Unspecified asthma, uncomplicated: Secondary | ICD-10-CM | POA: Diagnosis not present

## 2014-08-30 DIAGNOSIS — Z5189 Encounter for other specified aftercare: Secondary | ICD-10-CM | POA: Diagnosis not present

## 2014-08-30 DIAGNOSIS — Z471 Aftercare following joint replacement surgery: Secondary | ICD-10-CM | POA: Diagnosis not present

## 2014-08-30 DIAGNOSIS — R55 Syncope and collapse: Secondary | ICD-10-CM | POA: Diagnosis not present

## 2014-08-30 DIAGNOSIS — Z96659 Presence of unspecified artificial knee joint: Secondary | ICD-10-CM | POA: Diagnosis not present

## 2014-09-01 DIAGNOSIS — J309 Allergic rhinitis, unspecified: Secondary | ICD-10-CM | POA: Diagnosis not present

## 2014-09-01 DIAGNOSIS — Z5189 Encounter for other specified aftercare: Secondary | ICD-10-CM | POA: Diagnosis not present

## 2014-09-01 DIAGNOSIS — Z96659 Presence of unspecified artificial knee joint: Secondary | ICD-10-CM | POA: Diagnosis not present

## 2014-09-01 DIAGNOSIS — R55 Syncope and collapse: Secondary | ICD-10-CM | POA: Diagnosis not present

## 2014-09-01 DIAGNOSIS — Z471 Aftercare following joint replacement surgery: Secondary | ICD-10-CM | POA: Diagnosis not present

## 2014-09-01 DIAGNOSIS — J45909 Unspecified asthma, uncomplicated: Secondary | ICD-10-CM | POA: Diagnosis not present

## 2014-09-03 DIAGNOSIS — J45909 Unspecified asthma, uncomplicated: Secondary | ICD-10-CM | POA: Diagnosis not present

## 2014-09-03 DIAGNOSIS — Z5189 Encounter for other specified aftercare: Secondary | ICD-10-CM | POA: Diagnosis not present

## 2014-09-03 DIAGNOSIS — R55 Syncope and collapse: Secondary | ICD-10-CM | POA: Diagnosis not present

## 2014-09-03 DIAGNOSIS — Z96659 Presence of unspecified artificial knee joint: Secondary | ICD-10-CM | POA: Diagnosis not present

## 2014-09-03 DIAGNOSIS — Z471 Aftercare following joint replacement surgery: Secondary | ICD-10-CM | POA: Diagnosis not present

## 2014-09-09 ENCOUNTER — Encounter: Payer: Self-pay | Admitting: Gastroenterology

## 2014-09-13 DIAGNOSIS — J029 Acute pharyngitis, unspecified: Secondary | ICD-10-CM | POA: Diagnosis not present

## 2014-09-13 DIAGNOSIS — Z681 Body mass index (BMI) 19 or less, adult: Secondary | ICD-10-CM | POA: Diagnosis not present

## 2014-09-15 ENCOUNTER — Other Ambulatory Visit: Payer: Self-pay | Admitting: Gastroenterology

## 2014-09-20 DIAGNOSIS — H52209 Unspecified astigmatism, unspecified eye: Secondary | ICD-10-CM | POA: Diagnosis not present

## 2014-09-20 DIAGNOSIS — H2640 Unspecified secondary cataract: Secondary | ICD-10-CM | POA: Diagnosis not present

## 2014-09-23 DIAGNOSIS — J309 Allergic rhinitis, unspecified: Secondary | ICD-10-CM | POA: Diagnosis not present

## 2014-10-08 DIAGNOSIS — H59099 Other disorders of unspecified eye following cataract surgery: Secondary | ICD-10-CM | POA: Diagnosis not present

## 2014-10-08 DIAGNOSIS — J309 Allergic rhinitis, unspecified: Secondary | ICD-10-CM | POA: Diagnosis not present

## 2014-10-08 DIAGNOSIS — Z961 Presence of intraocular lens: Secondary | ICD-10-CM | POA: Diagnosis not present

## 2014-10-12 DIAGNOSIS — Z803 Family history of malignant neoplasm of breast: Secondary | ICD-10-CM | POA: Diagnosis not present

## 2014-10-12 DIAGNOSIS — Z1231 Encounter for screening mammogram for malignant neoplasm of breast: Secondary | ICD-10-CM | POA: Diagnosis not present

## 2014-10-18 DIAGNOSIS — D225 Melanocytic nevi of trunk: Secondary | ICD-10-CM | POA: Diagnosis not present

## 2014-10-18 DIAGNOSIS — L821 Other seborrheic keratosis: Secondary | ICD-10-CM | POA: Diagnosis not present

## 2014-10-18 DIAGNOSIS — Z08 Encounter for follow-up examination after completed treatment for malignant neoplasm: Secondary | ICD-10-CM | POA: Diagnosis not present

## 2014-10-18 DIAGNOSIS — Z85828 Personal history of other malignant neoplasm of skin: Secondary | ICD-10-CM | POA: Diagnosis not present

## 2014-10-18 DIAGNOSIS — J309 Allergic rhinitis, unspecified: Secondary | ICD-10-CM | POA: Diagnosis not present

## 2014-10-28 DIAGNOSIS — J309 Allergic rhinitis, unspecified: Secondary | ICD-10-CM | POA: Diagnosis not present

## 2014-11-04 DIAGNOSIS — H2640 Unspecified secondary cataract: Secondary | ICD-10-CM | POA: Diagnosis not present

## 2014-11-04 DIAGNOSIS — J309 Allergic rhinitis, unspecified: Secondary | ICD-10-CM | POA: Diagnosis not present

## 2014-11-04 DIAGNOSIS — H26491 Other secondary cataract, right eye: Secondary | ICD-10-CM | POA: Diagnosis not present

## 2014-11-05 DIAGNOSIS — M79676 Pain in unspecified toe(s): Secondary | ICD-10-CM | POA: Diagnosis not present

## 2014-11-05 DIAGNOSIS — B351 Tinea unguium: Secondary | ICD-10-CM | POA: Diagnosis not present

## 2014-11-11 DIAGNOSIS — H26492 Other secondary cataract, left eye: Secondary | ICD-10-CM | POA: Diagnosis not present

## 2014-11-17 DIAGNOSIS — J309 Allergic rhinitis, unspecified: Secondary | ICD-10-CM | POA: Diagnosis not present

## 2014-11-29 ENCOUNTER — Encounter: Payer: Self-pay | Admitting: Gastroenterology

## 2014-11-29 ENCOUNTER — Ambulatory Visit (INDEPENDENT_AMBULATORY_CARE_PROVIDER_SITE_OTHER): Payer: Medicare Other | Admitting: Gastroenterology

## 2014-11-29 VITALS — BP 149/82 | HR 82 | Temp 97.8°F | Ht 62.0 in | Wt 122.6 lb

## 2014-11-29 DIAGNOSIS — R1310 Dysphagia, unspecified: Secondary | ICD-10-CM

## 2014-11-29 DIAGNOSIS — D649 Anemia, unspecified: Secondary | ICD-10-CM

## 2014-11-29 DIAGNOSIS — R1312 Dysphagia, oropharyngeal phase: Secondary | ICD-10-CM | POA: Insufficient documentation

## 2014-11-29 MED ORDER — DEXLANSOPRAZOLE 60 MG PO CPDR: 60.0000 mg | DELAYED_RELEASE_CAPSULE | Freq: Every day | ORAL | Status: DC

## 2014-11-29 NOTE — Progress Notes (Signed)
Subjective:    Patient ID: Christine Leblanc, female    DOB: 1929-03-15, 80 y.o.   MRN: [id number]  Leonides Grills, MD  HPI EATING BUT STAYS STRESSED DUE TO CARING FOR MENTALLY CHALLENGED ADULT CHILD WHO IS ILL. HUSBAND DIED 5 YRS AGO AND SHE WAS CARING FOR HIM. HAD TKR IN JUL 2015. OCT 2013: 136 LBS AND TODAY 122 LBS. LOST WEIGHT ?UNINTENTIONAL. HAS PROBLEMS WITH DROOLING AT TIMES. HAS ALLERGIES?Marland Kitchen 1-2 YRS AGO LOST HEARING IN LEFT EAR. SAW DR. Benjamine Mola. IT CAME BACK.  LAST CBC/HEME POS STOOLS IN JUL-AUG 2015. BMs: fairly regular with PERI-COLACE. PROBLEMS SWALLOWING BIG PILLS AND CHEWING DUE TO UPPER PLATE. AVOID BEEF AND FIRM FOODS.  PT DENIES FEVER, CHILLS, HEMATOCHEZIA,  nausea, vomiting, melena, diarrhea, CHEST PAIN, SHORTNESS OF BREATH, CHANGE IN BOWEL IN HABITS, constipation, abdominal pain, OR heartburn or indigestion.   Past Medical History  Diagnosis Date  . Syncope and collapse 2007    Possible CVA in 2012 with left lower extremity weakness; refused hospitalization; CT-Atrophy and chronic microvascular ischemic change.   . Asthma   . Gastroesophageal reflux disease     Hiatal hernia; distal esophageal web requiring dilatation; gastric polyps; gastritis; refuses colonoscopy  . Lymphocytic thyroiditis   . Multiple thyroid nodules 2010    Adenomatous; thyroidectomy in 2010  . Hyperlipidemia 04/27/2011  . Anemia   . GERD (gastroesophageal reflux disease)   . Chronic neck pain   . Chronic back pain   . Pneumonia     hosp. for pneumonia- long time ago   . Hypothyroidism   . Seizures     yes- as a child- & into adult years, states she took med. for them at one time, stopped at 32 yrs. of age   . Degenerative joint disease     Left shoulder; cervical spine, knees & hands   . Cancer     skin Ca- ? basal cell   . History of stress test 1990's    stress test done under the care of Dr. Lattie Haw & Dr. Gwenlyn Found, now being followed by Dr. Harl Bowie- in [place] , recently seen & told to f/U  in one yr.   . Hypertension   . Osteoarthritis of right knee 07/27/2014   Past Surgical History  Procedure Laterality Date  . Abdominal hysterectomy      fibroids  . Prolapsed uterine fibroid ligation      outcomed with rectocele & cystocele  . Total thyroidectomy  2010  . Esophagogastroduodenoscopy  12/10    small hiatal ernia/gastric polyps/mild gastritis  . Cataract extraction      Bilateral; redo surgery on the right for incomplete primary procedure  . Colonoscopy  Remote  . Shoulder surgery      left  . Total knee arthroplasty Right 07/27/2014    Procedure: RIGHT TOTAL KNEE ARTHROPLASTY;  Surgeon: Johnny Bridge, MD;  Location: [facility];  Service: Orthopedics;  Laterality: Right;   Allergies  Allergen Reactions  . Celecoxib Shortness Of Breath    Current Outpatient Prescriptions  Medication Sig Dispense Refill  . acetaminophen (TYLENOL) 325 MG tablet Take 2 tablets (650 mg total) by mouth every 6 (six) hours as needed for mild pain (or Fever >/= 101). Total tylenol dose per 24 hrs including norco not to exceed 3,500 mg.  0  . albuterol (PROVENTIL HFA) 108 (90 BASE) MCG/ACT inhaler Inhale 2 puffs into the lungs every 6 (six) hours as needed. Wheezing, Asthma Symptoms    . aspirin  325 MG tablet Take 1 tablet (325 mg total) by mouth 2 (two) times daily.  0  . baclofen (LIORESAL) 10 MG tablet Take 1 tablet (10 mg total) by mouth 3 (three) times daily. As needed for muscle spasm  0  . calcium-vitamin D (OSCAL WITH D) 500-200 MG-UNIT per tablet Take 1 tablet by mouth 2 (two) times daily.     Marland Kitchen DEXILANT 60 MG capsule TAKE 1 CAPSULE DAILY  1  . dexlansoprazole (DEXILANT) 60 MG capsule Take 1 capsule (60 mg total) by mouth daily. WITH BREAKFAST  1  . diazepam (VALIUM) 5 MG tablet Take one tablet by mouth at bedtime as needed for sleep  5  . Glucosamine-Chondroit-Vit C-Mn (GLUCOSAMINE CHONDR 1500 COMPLX PO) Take 1 tablet by mouth 2 (two) times daily. INCLUDES: Chondroitin 1200mg  (1.2 g)      . HYDROcodone-acetaminophen (NORCO) 10-325 MG per tablet Take 1 to 2 tablets by mouth every 6 hours as needed for pain  0  . HYDROcodone-acetaminophen (NORCO/VICODIN) 5-325 MG per tablet Take one or two tablets by mouth every 6 hours as needed for moderate pain.  0  . levocetirizine (XYZAL) 5 MG tablet Take 5 mg by mouth every evening.      Marland Kitchen levothyroxine (SYNTHROID, LEVOTHROID) 100 MCG tablet Take 100 mcg by mouth daily before breakfast.    . montelukast (SINGULAIR) 10 MG tablet Take 10 mg by mouth at bedtime.    . Omega-3 Fatty Acids (FISH OIL) 1200 MG CAPS Take 1 capsule by mouth 2 (two) times daily.     . ondansetron (ZOFRAN) 4 MG tablet Take 1 tablet (4 mg total) by mouth every 8 (eight) hours as needed for nausea or vomiting. 30 tablet 0  . pravastatin (PRAVACHOL) 80 MG tablet Take 80 mg by mouth daily.    Marland Kitchen pyridOXINE (VITAMIN B-6) 100 MG tablet Take 100 mg by mouth at bedtime.    Orlie Dakin Sodium (PERI-COLACE PO) Take 1 tablet by mouth 2 (two) times daily.     . sodium phosphate (FLEET) 7-19 GM/118ML ENEM Place 133 mLs (1 enema total) rectally daily as needed for severe constipation. 5 enema 0  . vitamin B-12 (CYANOCOBALAMIN) 1000 MCG tablet Take 1,000 mcg by mouth every morning.    . vitamin E 400 UNIT capsule Take 400 Units by mouth at bedtime.      Family History  Problem Relation Age of Onset  . Heart disease Mother   . Gallbladder disease Mother   . Breast cancer Sister   . Colon cancer Neg Hx    History   Social History  . Marital Status: Widowed    Spouse Name: N/A    Number of Children: 4  . Years of Education: N/A   Occupational History  . 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 Retired     Marine scientist   Social History Main Topics  . Smoking status: Former Smoker -- 0.80 packs/day for 20 years    Types: Cigarettes    Quit date: 12/31/1978  . Smokeless tobacco: Never Used  . Alcohol Use: No  . Drug Use: No  . Sexual Activity: Not on file    Review of Systems PER HPI OTHERWISE ALL SYSTEMS ARE NEGATIVE.    Objective:   Physical Exam  Constitutional: She is oriented to person, place, and time. She appears well-developed and well-nourished. No distress.  HENT:  Head: Normocephalic and atraumatic.  Mouth/Throat: Oropharynx is clear and moist. No oropharyngeal  exudate.  Eyes: Pupils are equal, round, and reactive to light. No scleral icterus.  Neck: Normal range of motion. Neck supple.  Cardiovascular: Normal rate, regular rhythm and normal heart sounds.   Pulmonary/Chest: Effort normal and breath sounds normal. No respiratory distress.  Abdominal: Soft. Bowel sounds are normal. She exhibits no distension. There is no tenderness.  Musculoskeletal: She exhibits no edema.  Lymphadenopathy:    She has no cervical adenopathy.  Neurological: She is alert and oriented to person, place, and time.  NO  NEW FOCAL DEFICITS   Psychiatric: She has a normal mood and affect.  Vitals reviewed.         Assessment & Plan:

## 2014-11-29 NOTE — Patient Instructions (Signed)
  COMPLETE YOUR CBC IF HEMOGLOBIN IS LOW, YOU WILL NEED AN UPPER ENDOSCOPY AND WE COULD STRETCH YOUR ESOPHAGUS AT THAT TIME.    IF YOU BLOOD COUNT IS NORMAL, PLEASE CALL IF YOU WOULD LIKE TO HAVE YOUR ESOPHAGUS STRETCHED.    I WILL CONTINUE TO REFILL YOUR DEXILANT EVEN WITHOUT AN OFFICE VISIT.

## 2014-11-29 NOTE — Assessment & Plan Note (Signed)
Most likely due to POST-SURGICAL STATE.  CHECK CBC IF HB LOW, PT NEEDS EGD/?DIL. IF hB NL, PT WILL CALL IF SHE DESIRES EGD/DIL FOR DYSPHAGIA FOLLOW UP AS NEEDED. WILL CONTINUE TO REFiLL DEXILANT DUE TO PT HAS SPECIAL NEEDS DAUGHTER AT HOME AND IT IS DIFFICULT TO MAKE APPTS.

## 2014-11-29 NOTE — Assessment & Plan Note (Signed)
likely due to improper fitting dentures and/or esophageal motility disorder, doubt ESO OR GE JXN CANCER.    PT WILL CALL IF SHE DESIRES EGD/DIL FOR DYSPHAGIA SHE DECLINED TCS. FOLLOW UP AS NEEDED. WILL CONTINUE TO REFLL DEXILANT DUE TO PT HAS SPECIAL NEEDS DAUGHTER AT HOME AND IT IS DIFFICULT TO MAKE APPTS.

## 2014-12-03 DIAGNOSIS — J309 Allergic rhinitis, unspecified: Secondary | ICD-10-CM | POA: Diagnosis not present

## 2014-12-07 ENCOUNTER — Telehealth: Payer: Self-pay | Admitting: Gastroenterology

## 2014-12-07 DIAGNOSIS — D649 Anemia, unspecified: Secondary | ICD-10-CM | POA: Diagnosis not present

## 2014-12-07 NOTE — Telephone Encounter (Signed)
PLEASE CALL PATIENT REGARDING LABS

## 2014-12-07 NOTE — Telephone Encounter (Signed)
Pt said she did not have her orders for the CBC that Dr. Oneida Alar ordered when she was here for the office visit. I told her I will fax it to [facility].

## 2014-12-08 LAB — CBC WITH DIFFERENTIAL/PLATELET
BASOS ABS: 0.1 10*3/uL (ref 0.0–0.1)
BASOS PCT: 1 % (ref 0–1)
Eosinophils Absolute: 0.6 10*3/uL (ref 0.0–0.7)
Eosinophils Relative: 7 % — ABNORMAL HIGH (ref 0–5)
HEMATOCRIT: 34.2 % — AB (ref 36.0–46.0)
Hemoglobin: 11.3 g/dL — ABNORMAL LOW (ref 12.0–15.0)
Lymphocytes Relative: 23 % (ref 12–46)
Lymphs Abs: 1.9 10*3/uL (ref 0.7–4.0)
MCH: 30.1 pg (ref 26.0–34.0)
MCHC: 33 g/dL (ref 30.0–36.0)
MCV: 91 fL (ref 78.0–100.0)
MONOS PCT: 9 % (ref 3–12)
MPV: 10.2 fL (ref 9.4–12.4)
Monocytes Absolute: 0.8 10*3/uL (ref 0.1–1.0)
NEUTROS ABS: 5 10*3/uL (ref 1.7–7.7)
NEUTROS PCT: 60 % (ref 43–77)
Platelets: 169 10*3/uL (ref 150–400)
RBC: 3.76 MIL/uL — ABNORMAL LOW (ref 3.87–5.11)
RDW: 14.7 % (ref 11.5–15.5)
WBC: 8.4 10*3/uL (ref 4.0–10.5)

## 2014-12-15 NOTE — Telephone Encounter (Signed)
PLEASE CALL PT. HER BLOOD COUNT HAS IMPROVED SINCE AUG 2015. HER BLOOD COUNT WAS 9 AND NOW IT' S 11.3. HER BLOOD COUNT IS NOT NORMAL.  I RECOMMEND SHE HAVE AN EGD TO EVALUATE HER LOW BLOOD COUNT AND I COULD STRETCH HER ESOPHAGUS AT THAT TIME. SHE SHOULD LET us KNOW WHEN SHE WOULD LIKE TO SCHEDULE HER EGD.

## 2014-12-16 NOTE — Telephone Encounter (Signed)
Called pt this am.  No answer. Unable to leave message. Will try again later today.

## 2014-12-17 NOTE — Telephone Encounter (Signed)
Called pt and she has to call her family and see when that can provide transport before she can schedule. She will call us back

## 2014-12-20 DIAGNOSIS — J309 Allergic rhinitis, unspecified: Secondary | ICD-10-CM | POA: Diagnosis not present

## 2015-01-06 NOTE — Telephone Encounter (Signed)
Called pt and she still is wanting to schedule procedure after her son's appt. On 01/07/2015.  She states she will call us back

## 2015-01-07 DIAGNOSIS — K219 Gastro-esophageal reflux disease without esophagitis: Secondary | ICD-10-CM | POA: Diagnosis not present

## 2015-01-07 DIAGNOSIS — J019 Acute sinusitis, unspecified: Secondary | ICD-10-CM | POA: Diagnosis not present

## 2015-01-07 DIAGNOSIS — J4530 Mild persistent asthma, uncomplicated: Secondary | ICD-10-CM | POA: Diagnosis not present

## 2015-01-07 DIAGNOSIS — J3089 Other allergic rhinitis: Secondary | ICD-10-CM | POA: Diagnosis not present

## 2015-01-18 ENCOUNTER — Telehealth: Payer: Self-pay | Admitting: Endocrinology

## 2015-01-18 NOTE — Telephone Encounter (Signed)
Patient would like her labs faxed over to [facility] Fax # [phone]

## 2015-01-19 DIAGNOSIS — J309 Allergic rhinitis, unspecified: Secondary | ICD-10-CM | POA: Diagnosis not present

## 2015-01-24 ENCOUNTER — Ambulatory Visit: Payer: Medicare Other | Admitting: Endocrinology

## 2015-01-24 ENCOUNTER — Telehealth: Payer: Self-pay | Admitting: Endocrinology

## 2015-01-24 NOTE — Telephone Encounter (Signed)
Patient no showed today's appt. Please advise on how to follow up. °A. No follow up necessary. °B. Follow up urgent. Contact patient immediately. °C. Follow up necessary. Contact patient and schedule visit in ___ days. °D. Follow up advised. Contact patient and schedule visit in ____weeks. ° °

## 2015-01-25 NOTE — Telephone Encounter (Signed)
Follow-up needed at next available appointment

## 2015-01-26 ENCOUNTER — Telehealth: Payer: Self-pay | Admitting: Endocrinology

## 2015-01-26 NOTE — Telephone Encounter (Signed)
Patient would like her lab orders faxed to [facility]   [phone]  Thank you

## 2015-01-26 NOTE — Telephone Encounter (Signed)
faxed

## 2015-01-27 DIAGNOSIS — J3089 Other allergic rhinitis: Secondary | ICD-10-CM | POA: Diagnosis not present

## 2015-01-27 DIAGNOSIS — J301 Allergic rhinitis due to pollen: Secondary | ICD-10-CM | POA: Diagnosis not present

## 2015-01-28 ENCOUNTER — Other Ambulatory Visit: Payer: Self-pay | Admitting: Endocrinology

## 2015-01-28 DIAGNOSIS — E890 Postprocedural hypothyroidism: Secondary | ICD-10-CM | POA: Diagnosis not present

## 2015-01-28 DIAGNOSIS — J45909 Unspecified asthma, uncomplicated: Secondary | ICD-10-CM | POA: Diagnosis not present

## 2015-01-28 LAB — TSH: TSH: 2.912 u[IU]/mL (ref 0.350–4.500)

## 2015-01-28 LAB — T4, FREE: Free T4: 1.49 ng/dL (ref 0.80–1.80)

## 2015-02-01 ENCOUNTER — Other Ambulatory Visit: Payer: Self-pay

## 2015-02-01 ENCOUNTER — Ambulatory Visit (INDEPENDENT_AMBULATORY_CARE_PROVIDER_SITE_OTHER): Payer: Medicare Other | Admitting: Gastroenterology

## 2015-02-01 ENCOUNTER — Encounter: Payer: Self-pay | Admitting: Gastroenterology

## 2015-02-01 VITALS — BP 150/71 | HR 78 | Temp 97.0°F | Ht 62.0 in | Wt 123.4 lb

## 2015-02-01 DIAGNOSIS — R1310 Dysphagia, unspecified: Secondary | ICD-10-CM

## 2015-02-01 DIAGNOSIS — R1314 Dysphagia, pharyngoesophageal phase: Secondary | ICD-10-CM

## 2015-02-01 NOTE — Patient Instructions (Signed)
We have scheduled you for an upper endoscopy with dilation with Dr. Oneida Alar in the near future.   Further recommendations to follow.

## 2015-02-01 NOTE — Progress Notes (Signed)
Referring Provider: Elsie Lincoln, MD Primary Care Physician:  [name], MD  Primary GI: Dr. Oneida Alar   Chief Complaint  Patient presents with  . Dysphagia    HPI:   Christine Leblanc is a 81 y.o. female presenting today with a history of GERD and dysphagia. Anemia noted with Hgb 9.2 6 months ago, now 11.3. Normocytic anemia.   Has to "watch" how she swallows. Has had problems with allergies since Christmas. Solid food and pill dysphagia. No abdominal pain, no odynophagia. Appetite good. Weight stable. However, doesn't eat as much as I did. Constipation, takes pericolace. No hematochezia. Declining colonoscopy.   Past Medical History  Diagnosis Date  . Syncope and collapse 2007    Possible CVA in 2012 with left lower extremity weakness; refused hospitalization; CT-Atrophy and chronic microvascular ischemic change.   . Asthma   . Gastroesophageal reflux disease     Hiatal hernia; distal esophageal web requiring dilatation; gastric polyps; gastritis; refuses colonoscopy  . Lymphocytic thyroiditis   . Multiple thyroid nodules 2010    Adenomatous; thyroidectomy in 2010  . Hyperlipidemia 04/27/2011  . Anemia   . GERD (gastroesophageal reflux disease)   . Chronic neck pain   . Chronic back pain   . Pneumonia     hosp. for pneumonia- long time ago   . Hypothyroidism   . Seizures     yes- as a child- & into adult years, states she took med. for them at one time, stopped at 32 yrs. of age   . Degenerative joint disease     Left shoulder; cervical spine, knees & hands   . Cancer     skin Ca- ? basal cell   . History of stress test 1990's    stress test done under the care of Dr. Lattie Haw & Dr. Gwenlyn Found, now being followed by Dr. Harl Bowie- in [place] , recently seen & told to f/U in one yr.   . Hypertension   . Osteoarthritis of right knee 07/27/2014    Past Surgical History  Procedure Laterality Date  . Abdominal hysterectomy      fibroids  . Prolapsed uterine  fibroid ligation      outcomed with rectocele & cystocele  . Total thyroidectomy  2010  . Esophagogastroduodenoscopy  12/10    Dr. 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 Cataract extraction      Bilateral; redo surgery on the right for incomplete primary procedure  . Colonoscopy  Remote  . Shoulder surgery      left  . Total knee arthroplasty Right 07/27/2014    Procedure: RIGHT TOTAL KNEE ARTHROPLASTY;  Surgeon: Johnny Bridge, MD;  Location: [facility];  Service: Orthopedics;  Laterality: Right;    Current Outpatient Prescriptions  Medication Sig Dispense Refill  . acetaminophen (TYLENOL) 325 MG tablet Take 2 tablets (650 mg total) by mouth every 6 (six) hours as needed for mild pain (or Fever >/= 101). Total tylenol dose per 24 hrs including norco not to exceed 3,500 mg. 50 tablet 0  . albuterol (PROVENTIL HFA) 108 (90 BASE) MCG/ACT inhaler Inhale 2 puffs into the lungs every 6 (six) hours as needed. Wheezing, Asthma Symptoms    . aspirin 81 MG tablet Take 81 mg by mouth daily.    . calcium-vitamin D (OSCAL WITH D) 500-200 MG-UNIT per tablet Take 1 tablet by mouth 2 (two) times daily.     Marland Kitchen dexlansoprazole (DEXILANT) 60 MG capsule  Take 1 capsule (60 mg total) by mouth daily. WITH BREAKFAST 90 capsule 3  . diazepam (VALIUM) 5 MG tablet Take one tablet by mouth at bedtime as needed for sleep 30 tablet 5  . Glucosamine-Chondroit-Vit C-Mn (GLUCOSAMINE CHONDR 1500 COMPLX PO) Take 1 tablet by mouth 2 (two) times daily. INCLUDES: Chondroitin 1200mg  (1.2 g)    . HYDROcodone-acetaminophen (NORCO) 10-325 MG per tablet Take 1 to 2 tablets by mouth every 6 hours as needed for pain 240 tablet 0  . HYDROcodone-acetaminophen (NORCO/VICODIN) 5-325 MG per tablet Take one or two tablets by mouth every 6 hours as needed for moderate pain. 240 tablet 0  . levocetirizine (XYZAL) 5 MG tablet Take 5 mg by mouth every evening.      Marland Kitchen levothyroxine (SYNTHROID,  LEVOTHROID) 100 MCG tablet Take 100 mcg by mouth daily before breakfast.    . montelukast (SINGULAIR) 10 MG tablet Take 10 mg by mouth at bedtime.    . Omega-3 Fatty Acids (FISH OIL) 1200 MG CAPS Take 1 capsule by mouth 2 (two) times daily.     . ondansetron (ZOFRAN) 4 MG tablet Take 1 tablet (4 mg total) by mouth every 8 (eight) hours as needed for nausea or vomiting. 30 tablet 0  . pravastatin (PRAVACHOL) 80 MG tablet Take 80 mg by mouth daily.    Marland Kitchen pyridOXINE (VITAMIN B-6) 100 MG tablet Take 100 mg by mouth at bedtime.    Orlie Dakin Sodium (PERI-COLACE PO) Take 1 tablet by mouth 2 (two) times daily.     . vitamin B-12 (CYANOCOBALAMIN) 1000 MCG tablet Take 1,000 mcg by mouth every morning.    . vitamin E 400 UNIT capsule Take 400 Units by mouth at bedtime.      No current facility-administered medications for this visit.    Allergies as of 02/01/2015 - Review Complete 02/01/2015  Allergen Reaction Noted  . Celecoxib Shortness Of Breath     Family History  Problem Relation Age of Onset  . Heart disease Mother   . Gallbladder disease Mother   . Breast cancer Sister   . Colon cancer Neg Hx     History   Social History  . Marital Status: Widowed    Spouse Name: N/A    Number of Children: 4  . Years of Education: N/A   Occupational History  . 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 Retired     Marine scientist   Social History Main Topics  . Smoking status: Former Smoker -- 0.80 packs/day for 20 years    Types: Cigarettes    Quit date: 12/31/1978  . Smokeless tobacco: Never Used  . Alcohol Use: No  . Drug Use: No  . Sexual Activity: None   Other Topics Concern  . None   Social History Narrative    Review of Systems: Gen: see HPI CV: negative Resp: +coughing, +DOE, sinus congestion GI: see HPI Derm: Denies rash, itching, dry skin Psych: Denies depression, anxiety, memory loss, confusion. No homicidal or suicidal ideation.  Heme: Denies bruising, bleeding,  and enlarged lymph nodes.  Physical Exam: BP 150/71 mmHg  Pulse 78  Temp(Src) 97 F (36.1 C)  Ht 5\' 2"  (1.575 m)  Wt 123 lb 6.4 oz (55.974 kg)  BMI 22.56 kg/m2 General:   Alert and oriented. No distress noted. Pleasant and cooperative.  Head:  Normocephalic and atraumatic. Eyes:  Conjuctiva clear without scleral icterus. Mouth:  Oral mucosa pink and moist. Good dentition. No lesions.  Heart:  S1, S2 present without murmurs, rubs, or gallops. Regular rate and rhythm. Abdomen:  +BS, soft, non-tender and non-distended. No rebound or guarding. No HSM or masses noted. Msk:  Symmetrical without gross deformities. Normal posture. Extremities:  Without edema. Neurologic:  Alert and  oriented x4;  grossly normal neurologically. Skin:  Intact without significant lesions or rashes. Psych:  Alert and cooperative. Normal mood and affect.  Lab Results  Component Value Date   WBC 8.4 12/07/2014   HGB 11.3* 12/07/2014   HCT 34.2* 12/07/2014   MCV 91.0 12/07/2014   PLT 169 12/07/2014

## 2015-02-02 ENCOUNTER — Encounter: Payer: Self-pay | Admitting: Endocrinology

## 2015-02-02 ENCOUNTER — Ambulatory Visit (INDEPENDENT_AMBULATORY_CARE_PROVIDER_SITE_OTHER): Payer: Medicare Other | Admitting: Endocrinology

## 2015-02-02 VITALS — BP 148/82 | HR 84 | Temp 97.9°F | Ht 62.0 in | Wt 124.1 lb

## 2015-02-02 DIAGNOSIS — E890 Postprocedural hypothyroidism: Secondary | ICD-10-CM | POA: Diagnosis not present

## 2015-02-02 NOTE — Progress Notes (Signed)
Pre visit review using our clinic review tool, if applicable. No additional management support is needed unless otherwise documented below in the visit note. 

## 2015-02-02 NOTE — Patient Instructions (Signed)
Same dose 

## 2015-02-02 NOTE — Progress Notes (Signed)
Christine Leblanc 81 y.o.    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   She has been on 100 mcg since 03/2014 She does not complain of any new fatigue, cold sensitivity or hoarseness.  She has had fatigue for quite some time unrelated to her thyroid and she has no discussed this with her new family physician          Compliance with the medical regimen has been as prescribed with taking the tablet in the morning before breakfast.    Lab Results  Component Value Date   TSH 2.912 01/28/2015   TSH 2.546 07/21/2014   TSH 0.262* 04/14/2014        Medication List       This list is accurate as of: 02/02/15  2:06 PM.  Always use your most recent med list.               acetaminophen 325 MG tablet  Commonly known as:  TYLENOL  Take 2 tablets (650 mg total) by mouth every 6 (six) hours as needed for mild pain (or Fever >/= 101). Total tylenol dose per 24 hrs including norco not to exceed 3,500 mg.     aspirin 81 MG tablet  Take 81 mg by mouth daily.     calcium-vitamin D 500-200 MG-UNIT per tablet  Commonly known as:  OSCAL WITH D  Take 1 tablet by mouth 2 (two) times daily.     dexlansoprazole 60 MG capsule  Commonly known as:  DEXILANT  Take 1 capsule (60 mg total) by mouth daily. WITH BREAKFAST     diazepam 5 MG tablet  Commonly known as:  VALIUM  Take one tablet by mouth at bedtime as needed for sleep     Fish Oil 1200 MG Caps  Take 1 capsule by mouth 2 (two) times daily.     GLUCOSAMINE CHONDR 1500 COMPLX PO  Take 1 tablet by mouth 2 (two) times daily.  INCLUDES: Chondroitin 1200mg  (1.2 g)     HYDROcodone-acetaminophen 5-325 MG per tablet  Commonly known as:  NORCO/VICODIN  Take one or two tablets by mouth every 6 hours as needed for moderate pain.     HYDROcodone-acetaminophen 10-325 MG per tablet  Commonly known as:  NORCO  Take 1 to 2 tablets by mouth every 6 hours as needed for pain     levocetirizine 5 MG tablet  Commonly known as:  XYZAL  Take 5 mg by mouth every evening.     levothyroxine 100 MCG tablet  Commonly known as:  SYNTHROID, LEVOTHROID  Take 100 mcg by mouth daily before breakfast.     montelukast 10 MG tablet  Commonly known as:  SINGULAIR  Take 10 mg by mouth at bedtime.     ondansetron 4 MG tablet  Commonly known as:  ZOFRAN  Take 1 tablet (4 mg total) by mouth every 8 (eight) hours as needed for nausea or vomiting.     PERI-COLACE PO  Take 1 tablet by mouth 2 (two) times daily.     pravastatin 80 MG tablet  Commonly  known as:  PRAVACHOL  Take 80 mg by mouth daily.     PROVENTIL HFA 108 (90 BASE) MCG/ACT inhaler  Generic drug:  albuterol  Inhale 2 puffs into the lungs every 6 (six) hours as needed. Wheezing, Asthma Symptoms     pyridOXINE 100 MG tablet  Commonly known as:  VITAMIN B-6  Take 100 mg by mouth at bedtime.     vitamin B-12 1000 MCG tablet  Commonly known as:  CYANOCOBALAMIN  Take 1,000 mcg by mouth every morning.     vitamin E 400 UNIT capsule  Take 400 Units by mouth at bedtime.        Allergies:  Allergies  Allergen Reactions  . Celecoxib Shortness Of Breath    Past Medical History  Diagnosis Date  . Syncope and collapse 2007    Possible CVA in 2012 with left lower extremity weakness; refused hospitalization; CT-Atrophy and chronic microvascular ischemic change.   . Asthma   . Gastroesophageal reflux disease     Hiatal hernia; distal esophageal web requiring dilatation; gastric polyps; gastritis; refuses colonoscopy  . Lymphocytic thyroiditis   . Multiple thyroid  nodules 2010    Adenomatous; thyroidectomy in 2010  . Hyperlipidemia 04/27/2011  . Anemia   . GERD (gastroesophageal reflux disease)   . Chronic neck pain   . Chronic back pain   . Pneumonia     hosp. for pneumonia- long time ago   . Hypothyroidism   . Seizures     yes- as a child- & into adult years, states she took med. for them at one time, stopped at 32 yrs. of age   . Degenerative joint disease     Left shoulder; cervical spine, knees & hands   . Cancer     skin Ca- ? basal cell   . History of stress test 1990's    stress test done under the care of Dr. Lattie Haw & Dr. Gwenlyn Found, now being followed by Dr. Harl Bowie- in [place] , recently seen & told to f/U in one yr.   . Hypertension   . Osteoarthritis of right knee 07/27/2014    Past Surgical History  Procedure Laterality Date  . Abdominal hysterectomy      fibroids  . Prolapsed uterine fibroid ligation      outcomed with rectocele & cystocele  . Total thyroidectomy  2010  . Esophagogastroduodenoscopy  12/10    small hiatal ernia/gastric polyps/mild gastritis  . Cataract extraction      Bilateral; redo surgery on the right for incomplete primary procedure  . Colonoscopy  Remote  . Shoulder surgery      left  . Total knee arthroplasty Right 07/27/2014    Procedure: RIGHT TOTAL KNEE ARTHROPLASTY;  Surgeon: Johnny Bridge, MD;  Location: [facility];  Service: Orthopedics;  Laterality: Right;    Family History  Problem Relation Age of Onset  . Heart disease Mother   . Gallbladder disease Mother   . Breast cancer Sister   . Colon cancer Neg Hx     Social History:  reports that she quit smoking about 36 years ago. Her smoking use included Cigarettes. She has a 16 pack-year smoking history. She has never used smokeless tobacco. She reports that she does not drink alcohol or use illicit drugs.  REVIEW Of SYSTEMS:  Weight: This was 129 in 7/15  Wt Readings from Last 3 Encounters:  02/02/15 124 lb 2 oz (56.303 kg)  02/01/15  123 lb 6.4 oz (55.974 kg)  11/29/14 122 lb 9.6 oz (55.611 kg)    History of hypertension followed by PCP, blood pressure is usually 630 systolic elsewhere, off Rx now from cardiologist   Examination:   BP 148/82 mmHg  Pulse 84  Temp(Src) 97.9 F (36.6 C) (Oral)  Ht 5\' 2"  (1.575 m)  Wt 124 lb 2 oz (56.303 kg)  BMI 22.70 kg/m2  SpO2 97%     Thyroid not palpable Biceps reflexes normal    Assessments   1. Hypothyroidism, postsurgical with normal TSH now using supplementation dose of 100 mcg She will continue the same dose and followup at the end of the year  2.  Mild hypertension, followed by other physicians  [facility] 02/02/2015, 2:06 PM

## 2015-02-03 ENCOUNTER — Encounter: Payer: Self-pay | Admitting: Gastroenterology

## 2015-02-03 NOTE — Assessment & Plan Note (Signed)
81 year old female with persistent dysphagia, last EGD in 2010. Mild normocytic anemia noted recently but improved from last summer. No overt signs of GI bleeding and declining colonoscopy. Query web, ring, stricture, esophagitis, doubt malignancy.  Proceed with upper endoscopy/dilation in the near future with Dr. Oneida Alar. The risks, benefits, and alternatives have been discussed in detail with patient. They have stated understanding and desire to proceed.

## 2015-02-04 ENCOUNTER — Other Ambulatory Visit (HOSPITAL_COMMUNITY): Payer: Self-pay | Admitting: Family Medicine

## 2015-02-04 ENCOUNTER — Ambulatory Visit (HOSPITAL_COMMUNITY)
Admission: RE | Admit: 2015-02-04 | Discharge: 2015-02-04 | Disposition: A | Discharge status: Home / Self Care | Payer: Medicare Other | Source: Ambulatory Visit | Attending: Family Medicine | Admitting: Family Medicine

## 2015-02-04 DIAGNOSIS — Z79899 Other long term (current) drug therapy: Secondary | ICD-10-CM | POA: Diagnosis not present

## 2015-02-04 DIAGNOSIS — R05 Cough: Secondary | ICD-10-CM

## 2015-02-04 DIAGNOSIS — Z6822 Body mass index (BMI) 22.0-22.9, adult: Secondary | ICD-10-CM | POA: Diagnosis not present

## 2015-02-04 DIAGNOSIS — R5383 Other fatigue: Secondary | ICD-10-CM | POA: Diagnosis not present

## 2015-02-04 DIAGNOSIS — E782 Mixed hyperlipidemia: Secondary | ICD-10-CM | POA: Diagnosis not present

## 2015-02-04 DIAGNOSIS — E039 Hypothyroidism, unspecified: Secondary | ICD-10-CM | POA: Diagnosis not present

## 2015-02-04 DIAGNOSIS — M159 Polyosteoarthritis, unspecified: Secondary | ICD-10-CM | POA: Diagnosis not present

## 2015-02-04 DIAGNOSIS — D649 Anemia, unspecified: Secondary | ICD-10-CM | POA: Diagnosis not present

## 2015-02-04 DIAGNOSIS — Z87891 Personal history of nicotine dependence: Secondary | ICD-10-CM | POA: Insufficient documentation

## 2015-02-04 DIAGNOSIS — R059 Cough, unspecified: Secondary | ICD-10-CM

## 2015-02-09 DIAGNOSIS — J45909 Unspecified asthma, uncomplicated: Secondary | ICD-10-CM | POA: Diagnosis not present

## 2015-02-11 DIAGNOSIS — E1342 Other specified diabetes mellitus with diabetic polyneuropathy: Secondary | ICD-10-CM | POA: Diagnosis not present

## 2015-02-11 DIAGNOSIS — M79676 Pain in unspecified toe(s): Secondary | ICD-10-CM | POA: Diagnosis not present

## 2015-02-11 DIAGNOSIS — B351 Tinea unguium: Secondary | ICD-10-CM | POA: Diagnosis not present

## 2015-02-11 DIAGNOSIS — L851 Acquired keratosis [keratoderma] palmaris et plantaris: Secondary | ICD-10-CM | POA: Diagnosis not present

## 2015-02-11 NOTE — Progress Notes (Signed)
REVIEWED-NO ADDITIONAL RECOMMENDATIONS. 

## 2015-02-14 ENCOUNTER — Encounter (HOSPITAL_COMMUNITY): Admission: RE | Payer: Self-pay | Source: Ambulatory Visit

## 2015-02-14 ENCOUNTER — Ambulatory Visit (HOSPITAL_COMMUNITY): Admission: RE | Admit: 2015-02-14 | Payer: Medicare Other | Source: Ambulatory Visit | Admitting: Gastroenterology

## 2015-02-14 SURGERY — EGD (ESOPHAGOGASTRODUODENOSCOPY)
Anesthesia: Moderate Sedation

## 2015-02-15 ENCOUNTER — Telehealth: Payer: Self-pay

## 2015-02-15 NOTE — Telephone Encounter (Signed)
Pt is rescheduled for her EGD/ED on 03/21/15 @1130 . No changed ion her medications.

## 2015-02-15 NOTE — Telephone Encounter (Signed)
REVIEWED-NO ADDITIONAL RECOMMENDATIONS. 

## 2015-02-17 DIAGNOSIS — J45909 Unspecified asthma, uncomplicated: Secondary | ICD-10-CM | POA: Diagnosis not present

## 2015-02-19 NOTE — Progress Notes (Signed)
CC'ED TO PCP 

## 2015-02-20 ENCOUNTER — Other Ambulatory Visit: Payer: Self-pay | Admitting: Cardiology

## 2015-02-22 DIAGNOSIS — T7840XA Allergy, unspecified, initial encounter: Secondary | ICD-10-CM | POA: Diagnosis not present

## 2015-02-25 DIAGNOSIS — T7840XA Allergy, unspecified, initial encounter: Secondary | ICD-10-CM | POA: Diagnosis not present

## 2015-02-28 DIAGNOSIS — J45909 Unspecified asthma, uncomplicated: Secondary | ICD-10-CM | POA: Diagnosis not present

## 2015-03-08 ENCOUNTER — Other Ambulatory Visit: Payer: Self-pay | Admitting: Endocrinology

## 2015-03-09 ENCOUNTER — Other Ambulatory Visit: Payer: Self-pay

## 2015-03-09 DIAGNOSIS — J45909 Unspecified asthma, uncomplicated: Secondary | ICD-10-CM | POA: Diagnosis not present

## 2015-03-14 ENCOUNTER — Other Ambulatory Visit: Payer: Self-pay

## 2015-03-14 DIAGNOSIS — R1310 Dysphagia, unspecified: Secondary | ICD-10-CM

## 2015-03-17 DIAGNOSIS — J45909 Unspecified asthma, uncomplicated: Secondary | ICD-10-CM | POA: Diagnosis not present

## 2015-03-21 ENCOUNTER — Encounter (HOSPITAL_COMMUNITY): Payer: Self-pay | Admitting: *Deleted

## 2015-03-21 ENCOUNTER — Encounter (HOSPITAL_COMMUNITY)
Admission: RE | Disposition: A | Discharge status: Home / Self Care | Payer: Self-pay | Source: Ambulatory Visit | Attending: Gastroenterology

## 2015-03-21 ENCOUNTER — Ambulatory Visit (HOSPITAL_COMMUNITY)
Admission: RE | Admit: 2015-03-21 | Discharge: 2015-03-21 | Disposition: A | Discharge status: Home / Self Care | Payer: Medicare Other | Source: Ambulatory Visit | Attending: Gastroenterology | Admitting: Gastroenterology

## 2015-03-21 DIAGNOSIS — I10 Essential (primary) hypertension: Secondary | ICD-10-CM | POA: Diagnosis not present

## 2015-03-21 DIAGNOSIS — K2950 Unspecified chronic gastritis without bleeding: Secondary | ICD-10-CM | POA: Diagnosis not present

## 2015-03-21 DIAGNOSIS — K2970 Gastritis, unspecified, without bleeding: Secondary | ICD-10-CM | POA: Diagnosis not present

## 2015-03-21 DIAGNOSIS — E785 Hyperlipidemia, unspecified: Secondary | ICD-10-CM | POA: Diagnosis not present

## 2015-03-21 DIAGNOSIS — E039 Hypothyroidism, unspecified: Secondary | ICD-10-CM | POA: Diagnosis not present

## 2015-03-21 DIAGNOSIS — Z90710 Acquired absence of both cervix and uterus: Secondary | ICD-10-CM | POA: Insufficient documentation

## 2015-03-21 DIAGNOSIS — Q394 Esophageal web: Secondary | ICD-10-CM | POA: Insufficient documentation

## 2015-03-21 DIAGNOSIS — Z888 Allergy status to other drugs, medicaments and biological substances status: Secondary | ICD-10-CM | POA: Insufficient documentation

## 2015-03-21 DIAGNOSIS — J45909 Unspecified asthma, uncomplicated: Secondary | ICD-10-CM | POA: Insufficient documentation

## 2015-03-21 DIAGNOSIS — R1310 Dysphagia, unspecified: Secondary | ICD-10-CM

## 2015-03-21 DIAGNOSIS — Z87891 Personal history of nicotine dependence: Secondary | ICD-10-CM | POA: Insufficient documentation

## 2015-03-21 DIAGNOSIS — K317 Polyp of stomach and duodenum: Secondary | ICD-10-CM | POA: Insufficient documentation

## 2015-03-21 DIAGNOSIS — D649 Anemia, unspecified: Secondary | ICD-10-CM | POA: Diagnosis not present

## 2015-03-21 DIAGNOSIS — K219 Gastro-esophageal reflux disease without esophagitis: Secondary | ICD-10-CM | POA: Diagnosis not present

## 2015-03-21 DIAGNOSIS — Z85828 Personal history of other malignant neoplasm of skin: Secondary | ICD-10-CM | POA: Insufficient documentation

## 2015-03-21 DIAGNOSIS — Z79899 Other long term (current) drug therapy: Secondary | ICD-10-CM | POA: Insufficient documentation

## 2015-03-21 DIAGNOSIS — Z7982 Long term (current) use of aspirin: Secondary | ICD-10-CM | POA: Diagnosis not present

## 2015-03-21 DIAGNOSIS — M179 Osteoarthritis of knee, unspecified: Secondary | ICD-10-CM | POA: Insufficient documentation

## 2015-03-21 HISTORY — PX: SAVORY DILATION: SHX5439

## 2015-03-21 HISTORY — PX: ESOPHAGOGASTRODUODENOSCOPY: SHX5428

## 2015-03-21 SURGERY — EGD (ESOPHAGOGASTRODUODENOSCOPY)
Anesthesia: Moderate Sedation

## 2015-03-21 MED ORDER — STERILE WATER FOR IRRIGATION IR SOLN
Status: DC | PRN
  Administered 2015-03-21: 12:00:00

## 2015-03-21 MED ORDER — LIDOCAINE VISCOUS 2 % MT SOLN
OROMUCOSAL | Status: DC | PRN
  Administered 2015-03-21: 1 via OROMUCOSAL

## 2015-03-21 MED ORDER — MEPERIDINE HCL 100 MG/ML IJ SOLN
INTRAMUSCULAR | Status: DC | PRN
  Administered 2015-03-21 (×2): 25 mg

## 2015-03-21 MED ORDER — LIDOCAINE VISCOUS 2 % MT SOLN
OROMUCOSAL | Status: AC
  Filled 2015-03-21: qty 15

## 2015-03-21 MED ORDER — MINERAL OIL PO OIL
TOPICAL_OIL | ORAL | Status: AC
  Filled 2015-03-21: qty 30

## 2015-03-21 MED ORDER — MEPERIDINE HCL 100 MG/ML IJ SOLN
INTRAMUSCULAR | Status: AC
  Filled 2015-03-21: qty 2

## 2015-03-21 MED ORDER — MIDAZOLAM HCL 5 MG/5ML IJ SOLN
INTRAMUSCULAR | Status: DC | PRN
  Administered 2015-03-21 (×2): 2 mg via INTRAVENOUS

## 2015-03-21 MED ORDER — MIDAZOLAM HCL 5 MG/5ML IJ SOLN
INTRAMUSCULAR | Status: AC
  Filled 2015-03-21: qty 10

## 2015-03-21 NOTE — Discharge Instructions (Signed)
I dilated your esophagus. 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  You have mild gastritis & GASTRIC POLYPS. I biopsied your stomach.   CONTINUE DEXILANT  YOUR BIOPSY RESULTS WILL BE AVAILABLE IN MY CHART AFTER MAR 23   OR MY OFFICE WILL CONTACT YOU IN 10-14 DAYS WITH YOUR RESULTS.   FOLLOW UP IN 3 MOS.  UPPER ENDOSCOPY AFTER CARE Read the instructions outlined below and refer to this sheet in the next week. These discharge instructions provide you with general information on caring for yourself after you leave the hospital. While your treatment has been planned according to the most current medical practices available, unavoidable complications occasionally occur. If you have any problems or questions after discharge, call DR. FIELDS, [phone].  ACTIVITY  You may resume your regular activity, but move at a slower pace for the next 24 hours.   Take frequent rest periods for the next 24 hours.   Walking will help get rid of the air and reduce the bloated feeling in your belly (abdomen).   No driving for 24 hours (because of the medicine (anesthesia) used during the test).   You may shower.   Do not sign any important legal documents or operate any machinery for 24 hours (because of the anesthesia used during the test).    NUTRITION  Drink plenty of fluids.   You may resume your normal diet as instructed by your doctor.   Begin with a light meal and progress to your normal diet. Heavy or fried foods are harder to digest and may make you feel sick to your stomach (nauseated).   Avoid alcoholic beverages for 24 hours or as instructed.    MEDICATIONS  You may resume your normal medications.   WHAT YOU CAN EXPECT TODAY  Some feelings of bloating in the abdomen.   Passage of more gas than usual.    IF YOU HAD A BIOPSY TAKEN DURING THE UPPER ENDOSCOPY:  Eat a soft diet IF YOU HAVE NAUSEA, BLOATING, ABDOMINAL PAIN, OR VOMITING.    FINDING OUT THE  RESULTS OF YOUR TEST Not all test results are available during your visit. DR. Oneida Alar WILL CALL YOU WITHIN 14 DAYS OF YOUR PROCEDUE WITH YOUR RESULTS. Do not assume everything is normal if you have not heard from DR. FIELDS, CALL HER OFFICE AT [phone].  SEEK IMMEDIATE MEDICAL ATTENTION AND CALL THE OFFICE: [phone] IF:  You have more than a spotting of blood in your stool.   Your belly is swollen (abdominal distention).   You are nauseated or vomiting.   You have a temperature over 101F.   You have abdominal pain or discomfort that is severe or gets worse throughout the day.  Gastritis  Gastritis is an inflammation (the body's way of reacting to injury and/or infection) of the stomach. It is often caused by viral or bacterial (germ) infections. It can also be caused BY ASPIRIN, BC/GOODY POWDER'S, (IBUPROFEN) MOTRIN, OR ALEVE (NAPROXEN), chemicals (including alcohol), SPICY FOODS, and medications. This illness may be associated with generalized malaise (feeling tired, not well), UPPER ABDOMINAL STOMACH cramps, and fever. One common bacterial cause of gastritis is an organism known as H. Pylori. This can be treated with antibiotics.   ESOPHAGEAL STRICTURE  Esophageal strictures can be caused by stomach acid backing up into the tube that carries food from the mouth down to the stomach (lower esophagus).  TREATMENT There are a number of medicines used to treat reflux/stricture, including: Antacids.  Proton-pump  inhibitors: DEXILANT  HOME CARE INSTRUCTIONS Eat 2-3 hours before going to bed.  Try to reach and maintain a healthy weight.  Do not eat just a few very large meals. Instead, eat 4 TO 6 smaller meals throughout the day.  Try to identify foods and beverages that make your symptoms worse, and avoid these.  Avoid tight clothing.  Do not exercise right after eating.

## 2015-03-21 NOTE — H&P (Signed)
Primary Care Physician:  [name], MD Primary Gastroenterologist:  Dr. Oneida Alar  Pre-Procedure History & Physical: HPI:  Christine Leblanc is a 81 y.o. female here for [place].  Past Medical History  Diagnosis Date  . Syncope and collapse 2007    Possible CVA in 2012 with left lower extremity weakness; refused hospitalization; CT-Atrophy and chronic microvascular ischemic change.   . Asthma   . Gastroesophageal reflux disease     Hiatal hernia; distal esophageal web requiring dilatation; gastric polyps; gastritis; refuses colonoscopy  . Lymphocytic thyroiditis   . Multiple thyroid nodules 2010    Adenomatous; thyroidectomy in 2010  . Hyperlipidemia 04/27/2011  . Anemia   . GERD (gastroesophageal reflux disease)   . Chronic neck pain   . Chronic back pain   . Pneumonia     hosp. for pneumonia- long time ago   . Hypothyroidism   . Seizures     yes- as a child- & into adult years, states she took med. for them at one time, stopped at 32 yrs. of age   . Degenerative joint disease     Left shoulder; cervical spine, knees & hands   . Cancer     skin Ca- ? basal cell   . History of stress test 1990's    stress test done under the care of Dr. Lattie Haw & Dr. Gwenlyn Found, now being followed by Dr. Harl Bowie- in [place] , recently seen & told to f/U in one yr.   . Hypertension   . Osteoarthritis of right knee 07/27/2014   Past Surgical History  Procedure Laterality Date  . Abdominal hysterectomy      fibroids  . Prolapsed uterine fibroid ligation      outcomed with rectocele & cystocele  . Total thyroidectomy  2010  . Esophagogastroduodenoscopy  12/10    Dr. 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 Cataract extraction      Bilateral; redo surgery on the right for incomplete primary procedure  . Colonoscopy  Remote  . Shoulder surgery      left  . Total knee arthroplasty Right 07/27/2014    Procedure: RIGHT TOTAL KNEE  ARTHROPLASTY;  Surgeon: Johnny Bridge, MD;  Location: [facility];  Service: Orthopedics;  Laterality: Right;    Prior to Admission medications   Medication Sig Start Date End Date Taking? Authorizing Provider  albuterol (PROVENTIL HFA) 108 (90 BASE) MCG/ACT inhaler Inhale 2 puffs into the lungs every 6 (six) hours as needed. Wheezing, Asthma Symptoms   Yes Historical Provider, MD  amLODipine (NORVASC) 2.5 MG tablet Take 2.5 mg by mouth daily.   Yes Historical Provider, MD  aspirin 81 MG tablet Take 81 mg by mouth daily.   Yes Historical Provider, MD  benzonatate (TESSALON) 100 MG capsule Take 1 capsule by mouth 3 (three) times daily as needed. 01/18/15  Yes Historical Provider, MD  calcium-vitamin D (OSCAL WITH D) 500-200 MG-UNIT per tablet Take 1 tablet by mouth 2 (two) times daily.    Yes Historical Provider, MD  dexlansoprazole (DEXILANT) 60 MG capsule Take 1 capsule (60 mg total) by mouth daily. WITH BREAKFAST 11/29/14  Yes Danie Binder, MD  diazepam (VALIUM) 5 MG tablet Take one tablet by mouth at bedtime as needed for sleep Patient taking differently: Take 2.5-5 mg by mouth at bedtime as needed (sleep). Take one tablet by mouth at bedtime as needed for sleep 08/04/14  Yes Estill Dooms, MD  etodolac (LODINE)  300 MG capsule Take 300 mg by mouth 2 (two) times daily.   Yes Historical Provider, MD  Glucosamine-Chondroit-Vit C-Mn (GLUCOSAMINE CHONDR 1500 COMPLX PO) Take 1 tablet by mouth 2 (two) times daily. INCLUDES: Chondroitin 1200mg  (1.2 g)   Yes Historical Provider, MD  levocetirizine (XYZAL) 5 MG tablet Take 5 mg by mouth every evening.     Yes Historical Provider, MD  levothyroxine (SYNTHROID, LEVOTHROID) 100 MCG tablet TAKE 1 TABLET DAILY 03/08/15  Yes Elayne Snare, MD  montelukast (SINGULAIR) 10 MG tablet Take 10 mg by mouth at bedtime.   Yes Historical Provider, MD  Omega-3 Fatty Acids (FISH OIL) 1200 MG CAPS Take 1 capsule by mouth 2 (two) times daily.    Yes Historical Provider, MD   pravastatin (PRAVACHOL) 80 MG tablet TAKE 1 TABLET DAILY 02/21/15  Yes Arnoldo Lenis, MD  pyridOXINE (VITAMIN B-6) 100 MG tablet Take 100 mg by mouth at bedtime.   Yes Historical Provider, MD  Sennosides-Docusate Sodium (PERI-COLACE PO) Take 1 tablet by mouth 2 (two) times daily.    Yes Historical Provider, MD  vitamin B-12 (CYANOCOBALAMIN) 1000 MCG tablet Take 1,000 mcg by mouth every morning.   Yes Historical Provider, MD  vitamin E 400 UNIT capsule Take 400 Units by mouth at bedtime.    Yes Historical Provider, MD  acetaminophen (TYLENOL) 325 MG tablet Take 2 tablets (650 mg total) by mouth every 6 (six) hours as needed for mild pain (or Fever >/= 101). Total tylenol dose per 24 hrs including norco not to exceed 3,500 mg. Patient not taking: Reported on 02/08/2015 08/03/14   Marchia Bond, MD  HYDROcodone-acetaminophen [facility]) 10-325 MG per tablet Take 1 to 2 tablets by mouth every 6 hours as needed for pain Patient not taking: Reported on 02/08/2015 08/10/14   Estill Dooms, MD  HYDROcodone-acetaminophen (NORCO/VICODIN) 5-325 MG per tablet Take one or two tablets by mouth every 6 hours as needed for moderate pain. Patient taking differently: Take 1 tablet by mouth every 6 (six) hours as needed for severe pain. Take one or two tablets by mouth every 6 hours as needed for moderate pain. 08/04/14   Estill Dooms, MD  ondansetron (ZOFRAN) 4 MG tablet Take 1 tablet (4 mg total) by mouth every 8 (eight) hours as needed for nausea or vomiting. Patient not taking: Reported on 02/08/2015 07/27/14   Marchia Bond, MD    Allergies as of 03/14/2015 - Review Complete 02/08/2015  Allergen Reaction Noted  . Celecoxib Shortness Of Breath     Family History  Problem Relation Age of Onset  . Heart disease Mother   . Gallbladder disease Mother   . Breast cancer Sister   . Colon cancer Neg Hx     History   Social History  . Marital Status: Widowed    Spouse Name: N/A  . Number of Children: 4  . Years of  Education: N/A   Occupational History  . 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 Retired     Marine scientist   Social History Main Topics  . Smoking status: Former Smoker -- 0.80 packs/day for 20 years    Types: Cigarettes    Quit date: 12/31/1978  . Smokeless tobacco: Never Used  . Alcohol Use: No  . Drug Use: No  . Sexual Activity: Not on file   Other Topics Concern  . Not on file   Social History Narrative    Review of Systems: See HPI, otherwise  negative ROS   Physical Exam: BP 130/70 mmHg  Pulse 76  Temp(Src) 98.7 F (37.1 C) (Oral)  Resp 18  Ht 5\' 2"  (1.575 m)  Wt 124 lb (56.246 kg)  BMI 22.67 kg/m2  SpO2 95% General:   Alert,  pleasant and cooperative in NAD Head:  Normocephalic and atraumatic. Neck:  Supple; Lungs:  Clear throughout to auscultation.    Heart:  Regular rate and rhythm. Abdomen:  Soft, nontender and nondistended. Normal bowel sounds, without guarding, and without rebound.   Neurologic:  Alert and  oriented x4;  grossly normal neurologically.  Impression/Plan:    DYSPHAGIA  PLAN:  EGD/DIL TODAY

## 2015-03-21 NOTE — Op Note (Signed)
[facility] [address] [place], [address]   ENDOSCOPY PROCEDURE REPORT  PATIENT: Christine, Leblanc  MR#: [id number] BIRTHDATE: [birth year] , 70  yrs. old GENDER: female  ENDOSCOPIST: Danie Binder, MD REFFERED DC:VUDTHY Koberlein, M.D.  PROCEDURE DATE:  Apr 13, 2015 PROCEDURE:   EGD with dilatation over guidewire and EGD with biopsy   INDICATIONS:1.  dysphagia. MEDICATIONS: Demerol 50 mg IV and Versed 4 mg IV TOPICAL ANESTHETIC: Viscous Xylocaine  DESCRIPTION OF PROCEDURE:   After the risks benefits and alternatives of the procedure were thoroughly explained, informed consent was obtained.  The EG-[id number] ([id number])  endoscope was introduced through the mouth and advanced to the second portion of the duodenum. The instrument was slowly withdrawn as the mucosa was carefully examined.  Prior to withdrawal of the scope, the guidwire was placed.  The esophagus was dilated successfully.  The patient was recovered in endoscopy and discharged home in satisfactory condition.   ESOPHAGUS: MET MILD RESISTANCE WITH PASSING SCOPE INTO PROXIMAL ESOPHAGUS MOST LIKELY DUE TO ESOPHAGEAL WEB.  OTHERWISE NORMAL ESOPHAGEAL MUCOSA.   STOMACH: Multiple sessile polyps ranging between 5-52m in size were found in the gastric fundus and gastric body.  Multiple biopsies was performed using cold forceps.   Mild erosive gastritis (inflammation) was found in the gastric antrum. Multiple biopsies were performed using cold forceps.   Dilation was then performed at the proximal esophagus  Dilator: Savary over guidewire Size(s): 11-16 MM Resistance: minimal Heme: yes (TRACE)  COMPLICATIONS: There were no immediate complications.  ENDOSCOPIC IMPRESSION: 1.   DYSPHAGIA DUE TO A PROXIMAL ESOPHAEAL WEB 2.   Multiple GASTRIC polyps 3.   MILD Erosive gastritis  RECOMMENDATIONS: CONTINUE DEXILANT AWAIT BIOPSY. FOLLOW UP IN 3 MOS.   _______________________________ eSigned:Danie Binder  MD 02016-04-131:02 PM   CPT CODES: ICD CODES:  The ICD and CPT codes recommended by this software are interpretations from the data that the clinical staff has captured with the software.  The verification of the translation of this report to the ICD and CPT codes and modifiers is the sole responsibility of the health care institution and practicing physician where this report was generated.  [facility] will not be held responsible for the validity of the ICD and CPT codes included on this report.  AMA assumes no liability for data contained or not contained herein. CPT is a rDesigner, television/film setof the [employer]

## 2015-03-23 ENCOUNTER — Encounter (HOSPITAL_COMMUNITY): Payer: Self-pay | Admitting: Gastroenterology

## 2015-03-24 DIAGNOSIS — J45909 Unspecified asthma, uncomplicated: Secondary | ICD-10-CM | POA: Diagnosis not present

## 2015-03-31 DIAGNOSIS — J45909 Unspecified asthma, uncomplicated: Secondary | ICD-10-CM | POA: Diagnosis not present

## 2015-04-07 DIAGNOSIS — J45909 Unspecified asthma, uncomplicated: Secondary | ICD-10-CM | POA: Diagnosis not present

## 2015-04-08 ENCOUNTER — Telehealth: Payer: Self-pay | Admitting: Gastroenterology

## 2015-04-08 NOTE — Telephone Encounter (Signed)
Reminder in epic °

## 2015-04-08 NOTE — Telephone Encounter (Signed)
Please call pt. HER stomach Bx shows gastritis AND BENIGN STOMACHPOLYPS.   CONTINUE DEXILANT FOLLOW UP IN 3 MOS E30 GASTRITIS/DYSPHAGIA.

## 2015-04-11 NOTE — Telephone Encounter (Signed)
Pt is aware of results. 

## 2015-04-11 NOTE — Telephone Encounter (Signed)
Routing to Doris 

## 2015-04-19 DIAGNOSIS — Z79899 Other long term (current) drug therapy: Secondary | ICD-10-CM | POA: Diagnosis not present

## 2015-04-22 DIAGNOSIS — L851 Acquired keratosis [keratoderma] palmaris et plantaris: Secondary | ICD-10-CM | POA: Diagnosis not present

## 2015-04-22 DIAGNOSIS — E1342 Other specified diabetes mellitus with diabetic polyneuropathy: Secondary | ICD-10-CM | POA: Diagnosis not present

## 2015-04-22 DIAGNOSIS — B351 Tinea unguium: Secondary | ICD-10-CM | POA: Diagnosis not present

## 2015-04-26 DIAGNOSIS — D225 Melanocytic nevi of trunk: Secondary | ICD-10-CM | POA: Diagnosis not present

## 2015-04-26 DIAGNOSIS — Z08 Encounter for follow-up examination after completed treatment for malignant neoplasm: Secondary | ICD-10-CM | POA: Diagnosis not present

## 2015-04-26 DIAGNOSIS — Z85828 Personal history of other malignant neoplasm of skin: Secondary | ICD-10-CM | POA: Diagnosis not present

## 2015-04-26 DIAGNOSIS — T1490 Injury, unspecified: Secondary | ICD-10-CM | POA: Diagnosis not present

## 2015-04-27 DIAGNOSIS — J45909 Unspecified asthma, uncomplicated: Secondary | ICD-10-CM | POA: Diagnosis not present

## 2015-04-28 IMAGING — CR DG CHEST 2V
2 series · 2 of 2 positions shown · non-contrast
Comparison: 07/29/2014.

CLINICAL DATA: Cough.

EXAM:
CHEST  2 VIEW

[w chest lat]
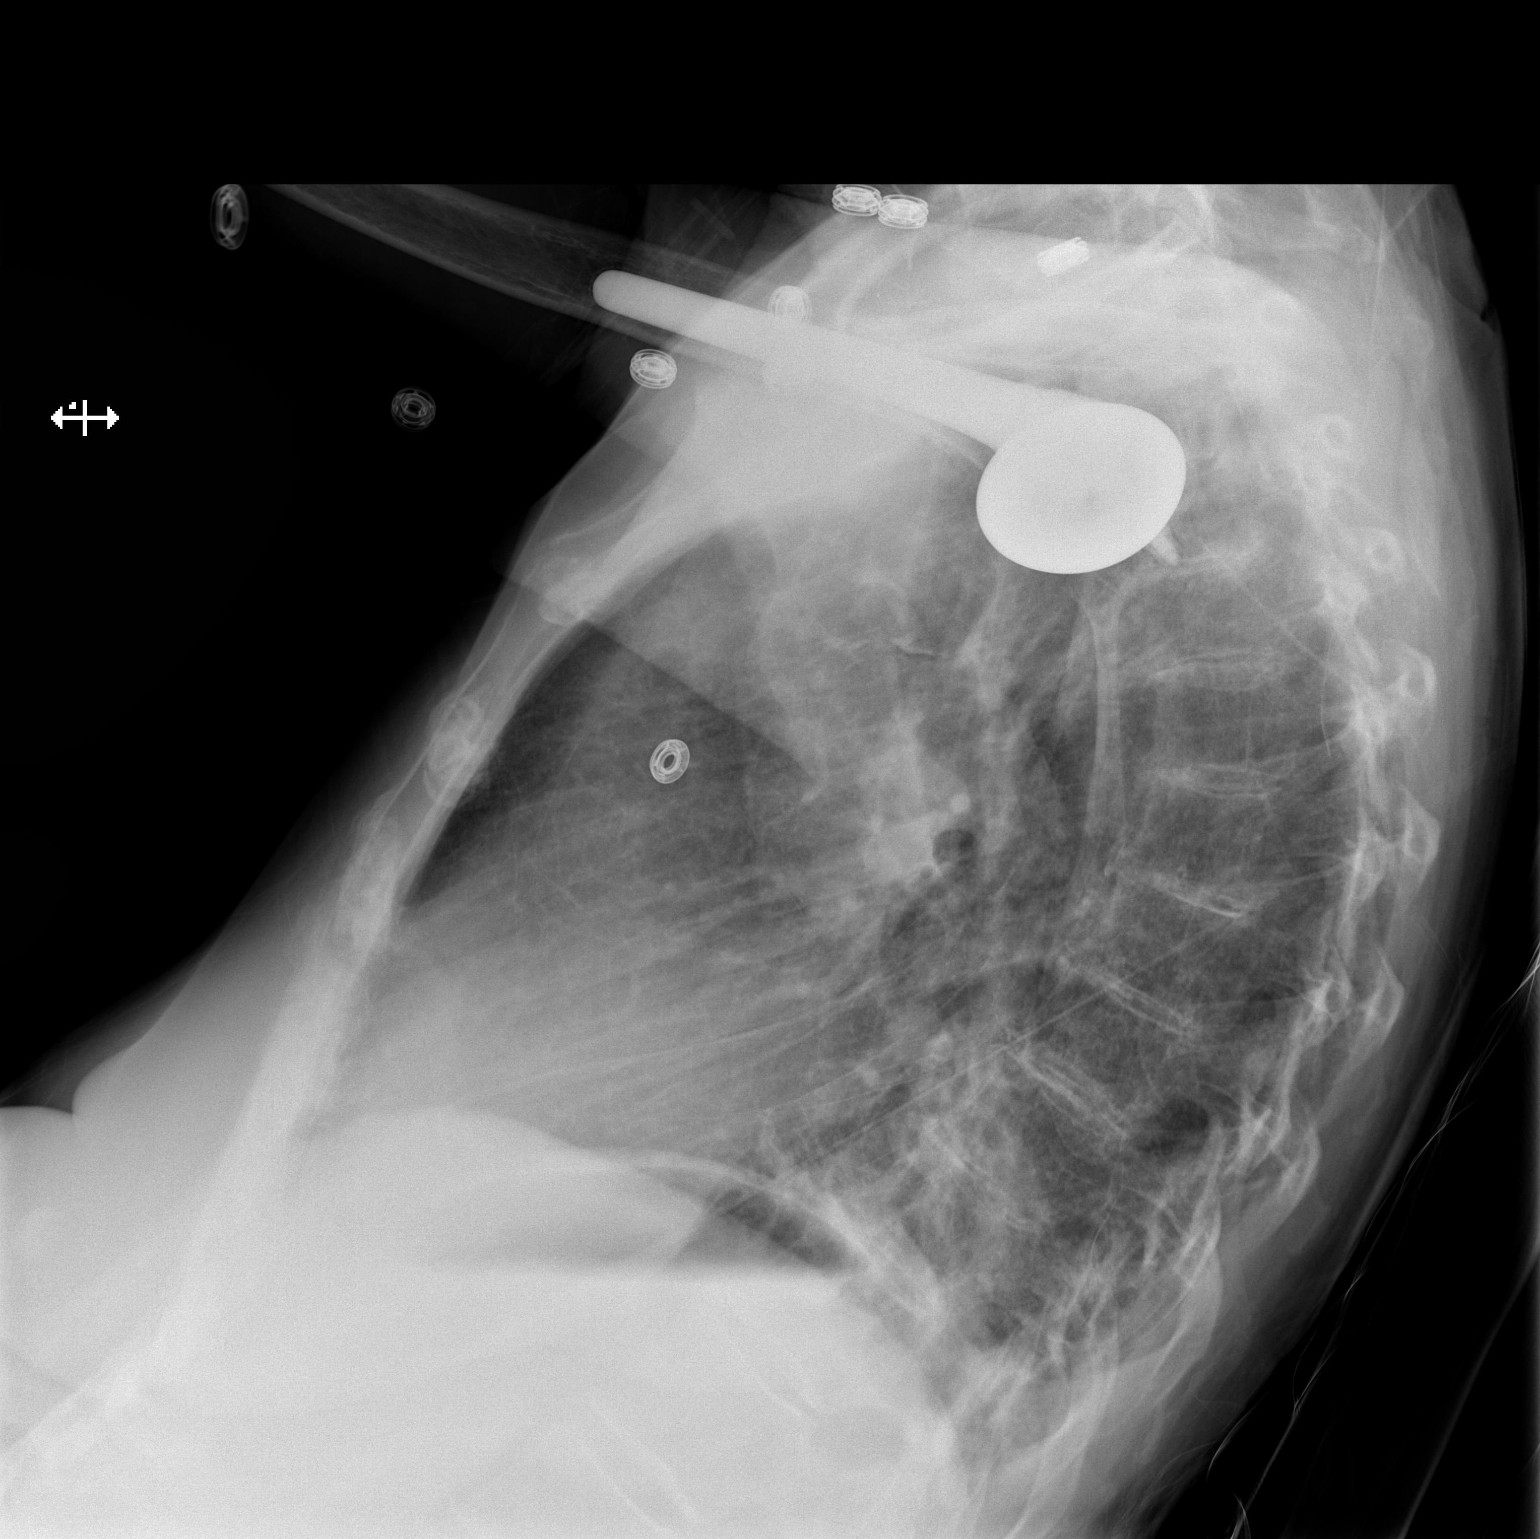

[x chest ap]
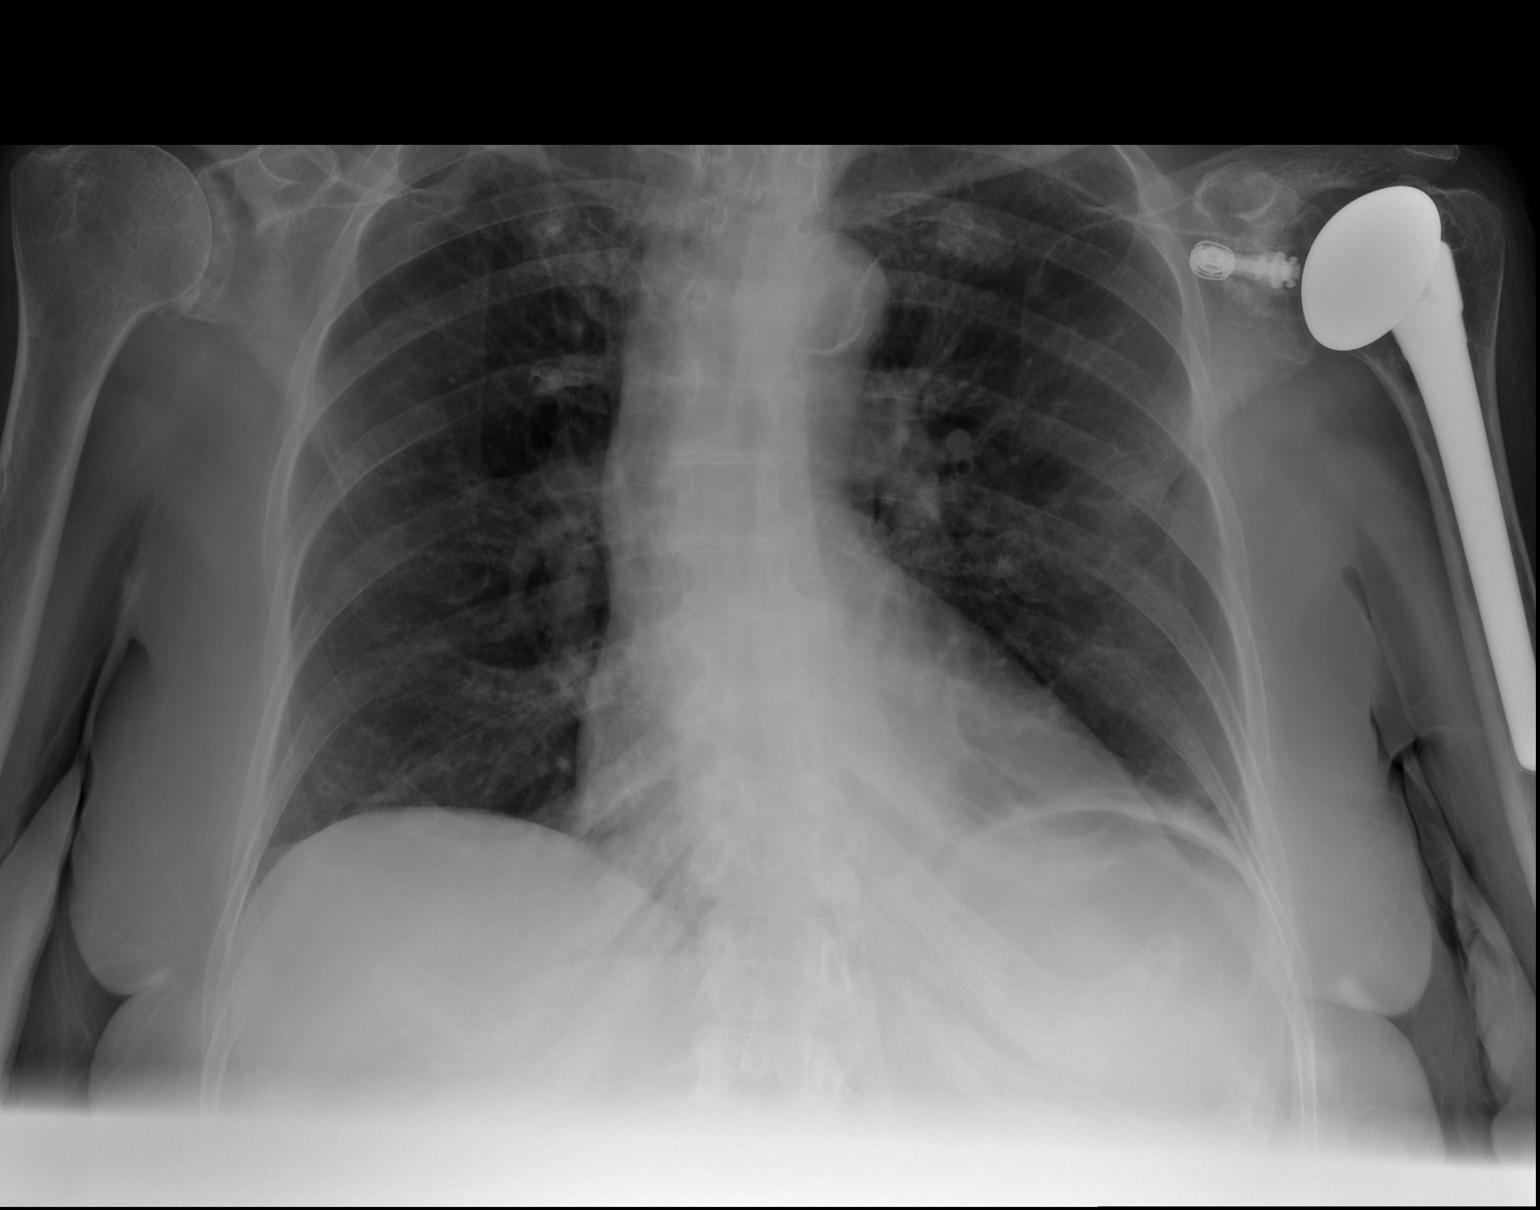

[2 of 2 positions shown; findings below may reference images not displayed]

FINDINGS: Mediastinum and hilar structures normal. Left base atelectasis
and/or infiltrate again noted. Stable cardiomegaly with normal
pulmonary vascularity. No acute bony abnormality. Left shoulder
replacement.
IMPRESSION: Left base atelectasis and/or mild infiltrate again noted.

## 2015-04-30 IMAGING — CR DG CHEST 2V
2 series · 2 of 2 positions shown · non-contrast
Comparison: 07/30/2014

CLINICAL DATA: Evaluate infiltrate versus pneumonia

EXAM:
CHEST  2 VIEW

[w chest lat]
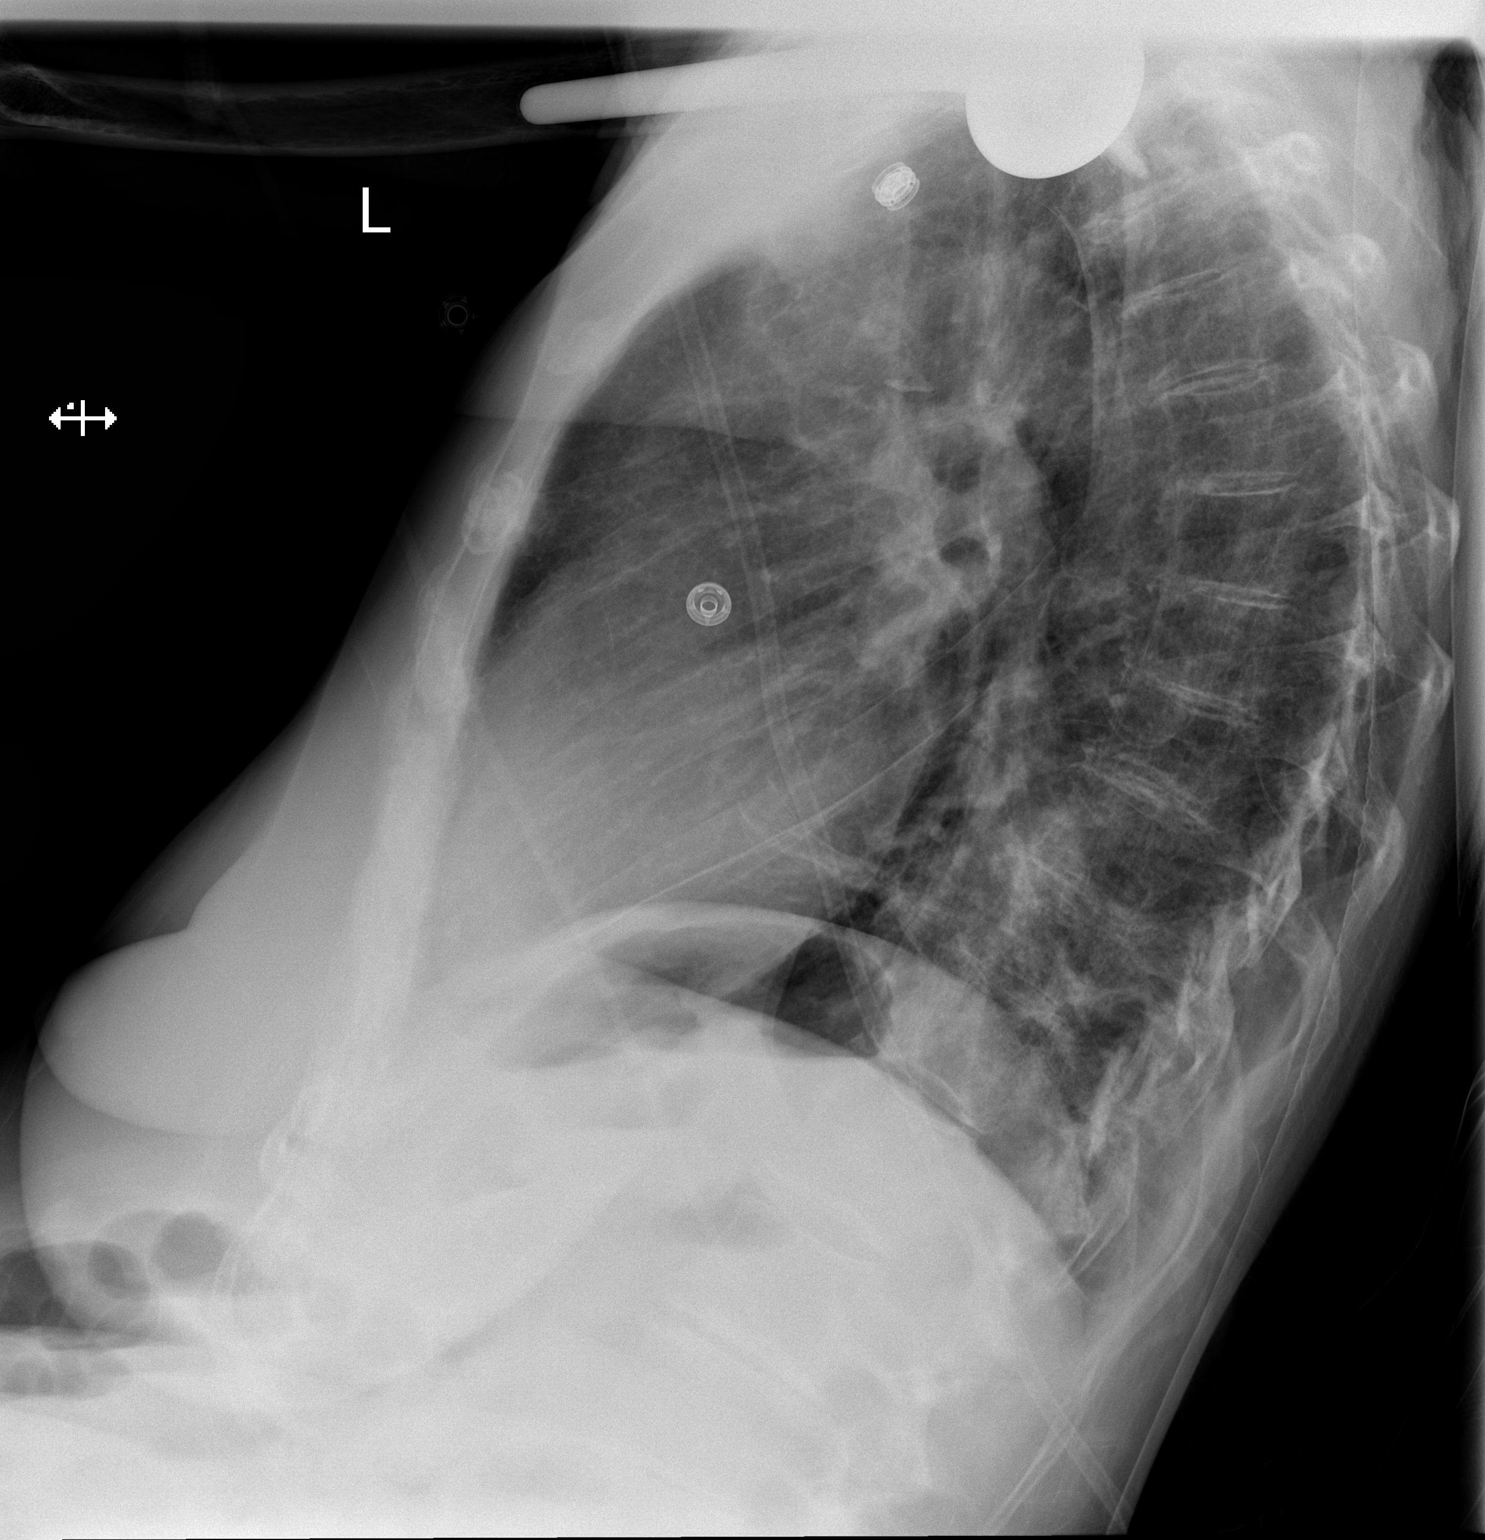

[x chest ap]
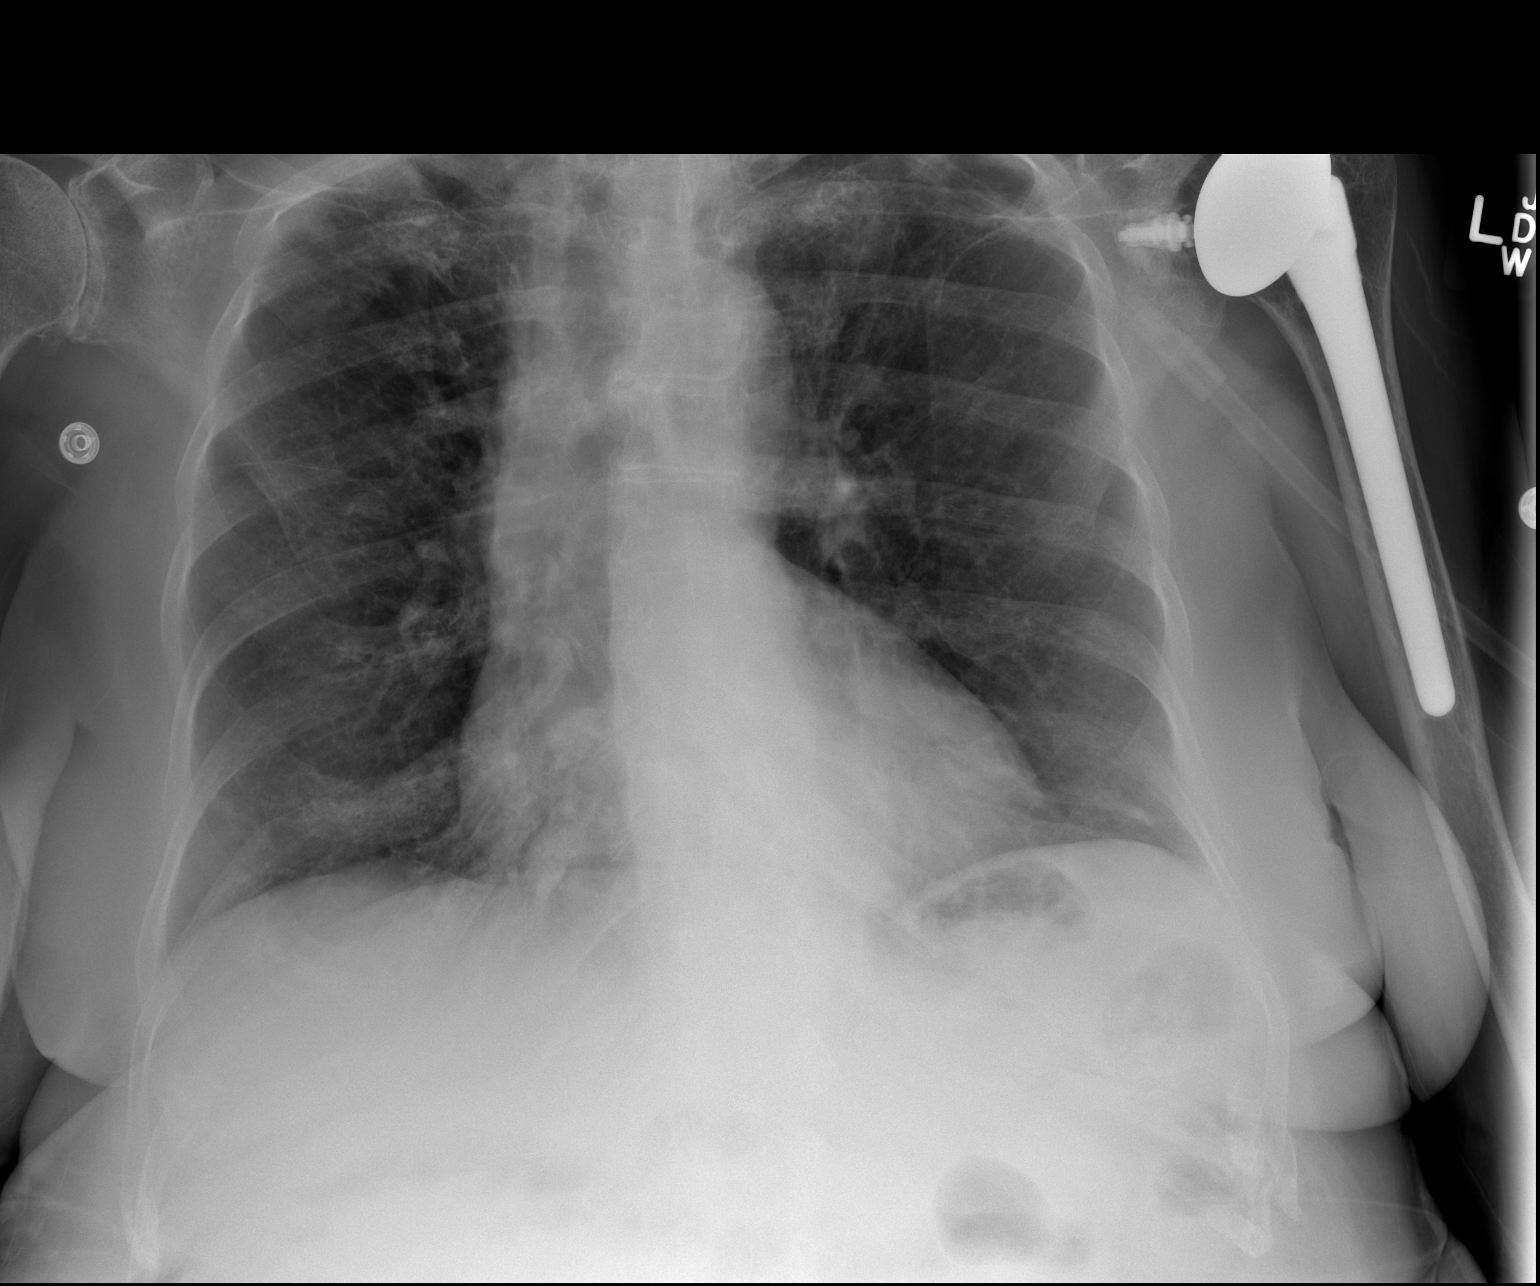

[2 of 2 positions shown; findings below may reference images not displayed]

FINDINGS: Chronic interstitial markings/emphysematous changes. No focal
consolidation. No pleural effusion or pneumothorax.

The heart is top-normal in size.

Left shoulder arthroplasty.
IMPRESSION: No evidence of acute cardiopulmonary disease.

## 2015-05-02 DIAGNOSIS — H9121 Sudden idiopathic hearing loss, right ear: Secondary | ICD-10-CM | POA: Diagnosis not present

## 2015-05-04 DIAGNOSIS — F4320 Adjustment disorder, unspecified: Secondary | ICD-10-CM | POA: Diagnosis not present

## 2015-05-04 DIAGNOSIS — Z6822 Body mass index (BMI) 22.0-22.9, adult: Secondary | ICD-10-CM | POA: Diagnosis not present

## 2015-05-12 ENCOUNTER — Ambulatory Visit (INDEPENDENT_AMBULATORY_CARE_PROVIDER_SITE_OTHER): Payer: Medicare Other | Admitting: Otolaryngology

## 2015-05-12 ENCOUNTER — Encounter: Payer: Self-pay | Admitting: Gastroenterology

## 2015-05-12 DIAGNOSIS — H9121 Sudden idiopathic hearing loss, right ear: Secondary | ICD-10-CM

## 2015-05-19 ENCOUNTER — Ambulatory Visit (INDEPENDENT_AMBULATORY_CARE_PROVIDER_SITE_OTHER): Payer: Medicare Other | Admitting: Otolaryngology

## 2015-05-19 DIAGNOSIS — H9121 Sudden idiopathic hearing loss, right ear: Secondary | ICD-10-CM | POA: Diagnosis not present

## 2015-05-19 DIAGNOSIS — J45909 Unspecified asthma, uncomplicated: Secondary | ICD-10-CM | POA: Diagnosis not present

## 2015-05-23 ENCOUNTER — Other Ambulatory Visit: Payer: Self-pay | Admitting: Orthopedic Surgery

## 2015-05-23 DIAGNOSIS — M19011 Primary osteoarthritis, right shoulder: Secondary | ICD-10-CM | POA: Diagnosis not present

## 2015-05-23 DIAGNOSIS — M25511 Pain in right shoulder: Secondary | ICD-10-CM

## 2015-05-25 ENCOUNTER — Ambulatory Visit
Admission: RE | Admit: 2015-05-25 | Discharge: 2015-05-25 | Disposition: A | Discharge status: Home / Self Care | Payer: Medicare Other | Source: Ambulatory Visit | Attending: Orthopedic Surgery | Admitting: Orthopedic Surgery

## 2015-05-25 DIAGNOSIS — M19011 Primary osteoarthritis, right shoulder: Secondary | ICD-10-CM | POA: Diagnosis not present

## 2015-05-25 DIAGNOSIS — M25511 Pain in right shoulder: Secondary | ICD-10-CM

## 2015-05-26 ENCOUNTER — Telehealth: Payer: Self-pay

## 2015-05-26 NOTE — Telephone Encounter (Signed)
PT call today that she will be having should shoulder on June 1 and would not be able to make an appointment until after then.

## 2015-05-27 DIAGNOSIS — J45909 Unspecified asthma, uncomplicated: Secondary | ICD-10-CM | POA: Diagnosis not present

## 2015-06-01 ENCOUNTER — Other Ambulatory Visit: Payer: Self-pay | Admitting: Orthopedic Surgery

## 2015-06-01 DIAGNOSIS — E782 Mixed hyperlipidemia: Secondary | ICD-10-CM | POA: Diagnosis not present

## 2015-06-01 DIAGNOSIS — I10 Essential (primary) hypertension: Secondary | ICD-10-CM | POA: Diagnosis not present

## 2015-06-01 DIAGNOSIS — Z6821 Body mass index (BMI) 21.0-21.9, adult: Secondary | ICD-10-CM | POA: Diagnosis not present

## 2015-06-01 NOTE — Telephone Encounter (Signed)
REVIEWED-NO ADDITIONAL RECOMMENDATIONS. 

## 2015-06-03 ENCOUNTER — Other Ambulatory Visit (HOSPITAL_COMMUNITY): Payer: Medicare Other

## 2015-06-03 DIAGNOSIS — Z79899 Other long term (current) drug therapy: Secondary | ICD-10-CM | POA: Diagnosis not present

## 2015-06-08 ENCOUNTER — Inpatient Hospital Stay (HOSPITAL_COMMUNITY): Admission: RE | Admit: 2015-06-08 | Payer: Medicare Other | Source: Ambulatory Visit

## 2015-06-13 ENCOUNTER — Encounter (HOSPITAL_COMMUNITY): Payer: Self-pay

## 2015-06-13 ENCOUNTER — Encounter (HOSPITAL_COMMUNITY)
Admission: RE | Admit: 2015-06-13 | Discharge: 2015-06-13 | Disposition: A | Discharge status: Home / Self Care | Payer: Medicare Other | Source: Ambulatory Visit | Attending: Orthopedic Surgery | Admitting: Orthopedic Surgery

## 2015-06-13 DIAGNOSIS — Z01818 Encounter for other preprocedural examination: Secondary | ICD-10-CM | POA: Diagnosis not present

## 2015-06-13 DIAGNOSIS — J45909 Unspecified asthma, uncomplicated: Secondary | ICD-10-CM | POA: Diagnosis not present

## 2015-06-13 LAB — BASIC METABOLIC PANEL
Anion gap: 7 (ref 5–15)
BUN: 24 mg/dL — ABNORMAL HIGH (ref 6–20)
CALCIUM: 9.1 mg/dL (ref 8.9–10.3)
CHLORIDE: 108 mmol/L (ref 101–111)
CO2: 25 mmol/L (ref 22–32)
CREATININE: 0.77 mg/dL (ref 0.44–1.00)
GFR calc non Af Amer: >60 mL/min (ref >60)
Glucose, Bld: 132 mg/dL — ABNORMAL HIGH (ref 65–99)
Potassium: 3.9 mmol/L (ref 3.5–5.1)
SODIUM: 140 mmol/L (ref 135–145)

## 2015-06-13 LAB — TYPE AND SCREEN
ABO/RH(D): AB POS
Antibody Screen: NEGATIVE

## 2015-06-13 LAB — CBC
HEMATOCRIT: 33.2 % — AB (ref 36.0–46.0)
HEMOGLOBIN: 11.1 g/dL — AB (ref 12.0–15.0)
MCH: 30.7 pg (ref 26.0–34.0)
MCHC: 33.4 g/dL (ref 30.0–36.0)
MCV: 91.7 fL (ref 78.0–100.0)
Platelets: 206 10*3/uL (ref 150–400)
RBC: 3.62 MIL/uL — AB (ref 3.87–5.11)
RDW: 13.7 % (ref 11.5–15.5)
WBC: 8.7 10*3/uL (ref 4.0–10.5)

## 2015-06-13 LAB — SURGICAL PCR SCREEN
MRSA, PCR: NEGATIVE
Staphylococcus aureus: NEGATIVE

## 2015-06-13 NOTE — Pre-Procedure Instructions (Signed)
    Christine Leblanc  06/13/2015      [facility] - [place], [place] [address] Phone: [phone] Fax: [phone]    Your procedure is scheduled on 06/21/15.  Report to [facility] Admitting at 845 A.M.  Call this number if you have problems the morning of surgery:  [phone]   Remember:  Do not eat food or drink liquids after midnight.  Take these medicines the morning of surgery with A SIP OF WATER all inhalers,norvasc,valium,synthroid,lodine   Do not wear jewelry, make-up or nail polish.  Do not wear lotions, powders, or perfumes.  You may wear deodorant.  Do not shave 48 hours prior to surgery.  Men may shave face and neck.  Do not bring valuables to the hospital.  [facility] is not responsible for any belongings or valuables.  Contacts, dentures or bridgework may not be worn into surgery.  Leave your suitcase in the car.  After surgery it may be brought to your room.  For patients admitted to the hospital, discharge time will be determined by your treatment team.  Patients discharged the day of surgery will not be allowed to drive home.   Name and phone number of your drive  Please read over the following fact sheets that you were given. Pain Booklet, Coughing and Deep Breathing, MRSA Information and Surgical Site Infection Prevention

## 2015-06-20 DIAGNOSIS — J45909 Unspecified asthma, uncomplicated: Secondary | ICD-10-CM | POA: Diagnosis not present

## 2015-06-20 MED ORDER — CEFAZOLIN SODIUM-DEXTROSE 2-3 GM-% IV SOLR
2.0000 g | INTRAVENOUS | Status: AC
  Administered 2015-06-21: 2 g via INTRAVENOUS
  Filled 2015-06-20: qty 50

## 2015-06-21 ENCOUNTER — Inpatient Hospital Stay (HOSPITAL_COMMUNITY): Payer: Medicare Other | Admitting: Certified Registered Nurse Anesthetist

## 2015-06-21 ENCOUNTER — Inpatient Hospital Stay (HOSPITAL_COMMUNITY)
Admission: RE | Admit: 2015-06-21 | Discharge: 2015-06-22 | DRG: 483 | Disposition: A | Discharge status: Home / Self Care | Payer: Medicare Other | Source: Ambulatory Visit | Attending: Orthopedic Surgery | Admitting: Orthopedic Surgery

## 2015-06-21 ENCOUNTER — Inpatient Hospital Stay (HOSPITAL_COMMUNITY): Payer: Medicare Other

## 2015-06-21 ENCOUNTER — Encounter (HOSPITAL_COMMUNITY)
Admission: RE | Disposition: A | Discharge status: Home / Self Care | Payer: Self-pay | Source: Ambulatory Visit | Attending: Orthopedic Surgery

## 2015-06-21 ENCOUNTER — Encounter (HOSPITAL_COMMUNITY): Payer: Self-pay | Admitting: Certified Registered Nurse Anesthetist

## 2015-06-21 DIAGNOSIS — Z7982 Long term (current) use of aspirin: Secondary | ICD-10-CM | POA: Diagnosis not present

## 2015-06-21 DIAGNOSIS — E785 Hyperlipidemia, unspecified: Secondary | ICD-10-CM | POA: Diagnosis present

## 2015-06-21 DIAGNOSIS — Z791 Long term (current) use of non-steroidal anti-inflammatories (NSAID): Secondary | ICD-10-CM

## 2015-06-21 DIAGNOSIS — Z96651 Presence of right artificial knee joint: Secondary | ICD-10-CM | POA: Diagnosis present

## 2015-06-21 DIAGNOSIS — M19011 Primary osteoarthritis, right shoulder: Secondary | ICD-10-CM | POA: Diagnosis not present

## 2015-06-21 DIAGNOSIS — Z888 Allergy status to other drugs, medicaments and biological substances status: Secondary | ICD-10-CM | POA: Diagnosis not present

## 2015-06-21 DIAGNOSIS — J45909 Unspecified asthma, uncomplicated: Secondary | ICD-10-CM | POA: Diagnosis present

## 2015-06-21 DIAGNOSIS — E039 Hypothyroidism, unspecified: Secondary | ICD-10-CM | POA: Diagnosis present

## 2015-06-21 DIAGNOSIS — I10 Essential (primary) hypertension: Secondary | ICD-10-CM | POA: Diagnosis not present

## 2015-06-21 DIAGNOSIS — Z471 Aftercare following joint replacement surgery: Secondary | ICD-10-CM | POA: Diagnosis not present

## 2015-06-21 DIAGNOSIS — K219 Gastro-esophageal reflux disease without esophagitis: Secondary | ICD-10-CM | POA: Diagnosis present

## 2015-06-21 DIAGNOSIS — Z96611 Presence of right artificial shoulder joint: Secondary | ICD-10-CM

## 2015-06-21 DIAGNOSIS — M542 Cervicalgia: Secondary | ICD-10-CM | POA: Diagnosis not present

## 2015-06-21 DIAGNOSIS — Z87891 Personal history of nicotine dependence: Secondary | ICD-10-CM | POA: Diagnosis not present

## 2015-06-21 DIAGNOSIS — Z96619 Presence of unspecified artificial shoulder joint: Secondary | ICD-10-CM

## 2015-06-21 DIAGNOSIS — G8918 Other acute postprocedural pain: Secondary | ICD-10-CM | POA: Diagnosis not present

## 2015-06-21 HISTORY — DX: Primary osteoarthritis, right shoulder: M19.011

## 2015-06-21 HISTORY — PX: TOTAL SHOULDER ARTHROPLASTY: SHX126

## 2015-06-21 SURGERY — ARTHROPLASTY, SHOULDER, TOTAL
Anesthesia: General | Site: Shoulder | Laterality: Right

## 2015-06-21 MED ORDER — PHENYLEPHRINE HCL 10 MG/ML IJ SOLN
10.0000 mg | INTRAVENOUS | Status: DC | PRN
  Administered 2015-06-21: 20 ug/min via INTRAVENOUS

## 2015-06-21 MED ORDER — POTASSIUM CHLORIDE IN NACL 20-0.45 MEQ/L-% IV SOLN
INTRAVENOUS | Status: DC
  Administered 2015-06-21 – 2015-06-22 (×2): via INTRAVENOUS
  Filled 2015-06-21 (×4): qty 1000

## 2015-06-21 MED ORDER — FENTANYL CITRATE (PF) 100 MCG/2ML IJ SOLN
100.0000 ug | Freq: Once | INTRAMUSCULAR | Status: AC
  Administered 2015-06-21: 100 ug via INTRAVENOUS

## 2015-06-21 MED ORDER — ALBUTEROL SULFATE HFA 108 (90 BASE) MCG/ACT IN AERS: 1.0000 | INHALATION_SPRAY | Freq: Four times a day (QID) | RESPIRATORY_TRACT | Status: DC | PRN

## 2015-06-21 MED ORDER — ALBUTEROL SULFATE (2.5 MG/3ML) 0.083% IN NEBU: 2.5000 mg | INHALATION_SOLUTION | Freq: Four times a day (QID) | RESPIRATORY_TRACT | Status: DC | PRN

## 2015-06-21 MED ORDER — METOCLOPRAMIDE HCL 5 MG/ML IJ SOLN: 5.0000 mg | Freq: Three times a day (TID) | INTRAMUSCULAR | Status: DC | PRN

## 2015-06-21 MED ORDER — VITAMIN B-12 1000 MCG PO TABS
1000.0000 ug | ORAL_TABLET | ORAL | Status: DC
  Administered 2015-06-22: 1000 ug via ORAL
  Filled 2015-06-21: qty 1

## 2015-06-21 MED ORDER — LIDOCAINE HCL (CARDIAC) 20 MG/ML IV SOLN
INTRAVENOUS | Status: AC
  Filled 2015-06-21: qty 15

## 2015-06-21 MED ORDER — AMLODIPINE BESYLATE 5 MG PO TABS
5.0000 mg | ORAL_TABLET | Freq: Every day | ORAL | Status: DC
  Administered 2015-06-22: 5 mg via ORAL
  Filled 2015-06-21: qty 1

## 2015-06-21 MED ORDER — BUPIVACAINE HCL (PF) 0.25 % IJ SOLN
INTRAMUSCULAR | Status: AC
  Filled 2015-06-21: qty 30

## 2015-06-21 MED ORDER — EPHEDRINE SULFATE 50 MG/ML IJ SOLN
INTRAMUSCULAR | Status: AC
  Filled 2015-06-21: qty 1

## 2015-06-21 MED ORDER — AMLODIPINE BESYLATE 2.5 MG PO TABS: 2.5000 mg | ORAL_TABLET | Freq: Every day | ORAL | Status: DC

## 2015-06-21 MED ORDER — GLYCOPYRROLATE 0.2 MG/ML IJ SOLN
INTRAMUSCULAR | Status: AC
  Filled 2015-06-21: qty 3

## 2015-06-21 MED ORDER — MIDAZOLAM HCL 2 MG/2ML IJ SOLN: 2.0000 mg | Freq: Once | INTRAMUSCULAR | Status: DC

## 2015-06-21 MED ORDER — NEOSTIGMINE METHYLSULFATE 10 MG/10ML IV SOLN
INTRAVENOUS | Status: DC | PRN
  Administered 2015-06-21: 2 mg via INTRAVENOUS
  Administered 2015-06-21: 1 mg via INTRAVENOUS

## 2015-06-21 MED ORDER — BACLOFEN 10 MG PO TABS: 10.0000 mg | ORAL_TABLET | Freq: Three times a day (TID) | ORAL | Status: DC

## 2015-06-21 MED ORDER — LEVOTHYROXINE SODIUM 100 MCG PO TABS
100.0000 ug | ORAL_TABLET | Freq: Every day | ORAL | Status: DC
  Administered 2015-06-22: 100 ug via ORAL
  Filled 2015-06-21: qty 1

## 2015-06-21 MED ORDER — METOCLOPRAMIDE HCL 5 MG PO TABS: 5.0000 mg | ORAL_TABLET | Freq: Three times a day (TID) | ORAL | Status: DC | PRN

## 2015-06-21 MED ORDER — PHENOL 1.4 % MT LIQD: 1.0000 | OROMUCOSAL | Status: DC | PRN

## 2015-06-21 MED ORDER — MENTHOL 3 MG MT LOZG: 1.0000 | LOZENGE | OROMUCOSAL | Status: DC | PRN

## 2015-06-21 MED ORDER — NEOSTIGMINE METHYLSULFATE 10 MG/10ML IV SOLN
INTRAVENOUS | Status: AC
  Filled 2015-06-21: qty 2

## 2015-06-21 MED ORDER — DOCUSATE SODIUM 100 MG PO CAPS
100.0000 mg | ORAL_CAPSULE | Freq: Two times a day (BID) | ORAL | Status: DC
  Administered 2015-06-21 – 2015-06-22 (×2): 100 mg via ORAL
  Filled 2015-06-21 (×2): qty 1

## 2015-06-21 MED ORDER — ONDANSETRON HCL 4 MG PO TABS: 4.0000 mg | ORAL_TABLET | Freq: Four times a day (QID) | ORAL | Status: DC | PRN

## 2015-06-21 MED ORDER — MONTELUKAST SODIUM 10 MG PO TABS
10.0000 mg | ORAL_TABLET | Freq: Every day | ORAL | Status: DC
  Administered 2015-06-21: 10 mg via ORAL
  Filled 2015-06-21: qty 1

## 2015-06-21 MED ORDER — GLYCOPYRROLATE 0.2 MG/ML IJ SOLN
INTRAMUSCULAR | Status: DC | PRN
  Administered 2015-06-21 (×3): .2 mg via INTRAVENOUS

## 2015-06-21 MED ORDER — DIPHENHYDRAMINE HCL 12.5 MG/5ML PO ELIX: 12.5000 mg | ORAL_SOLUTION | ORAL | Status: DC | PRN

## 2015-06-21 MED ORDER — MORPHINE SULFATE 2 MG/ML IJ SOLN
2.0000 mg | INTRAMUSCULAR | Status: DC | PRN
  Administered 2015-06-21 – 2015-06-22 (×2): 2 mg via INTRAVENOUS
  Filled 2015-06-21 (×2): qty 1

## 2015-06-21 MED ORDER — ONDANSETRON HCL 4 MG/2ML IJ SOLN
INTRAMUSCULAR | Status: AC
  Filled 2015-06-21: qty 2

## 2015-06-21 MED ORDER — PRAVASTATIN SODIUM 40 MG PO TABS
80.0000 mg | ORAL_TABLET | Freq: Every day | ORAL | Status: DC
  Administered 2015-06-21: 80 mg via ORAL
  Filled 2015-06-21: qty 2

## 2015-06-21 MED ORDER — PROPOFOL 10 MG/ML IV BOLUS
INTRAVENOUS | Status: DC | PRN
  Administered 2015-06-21: 100 mg via INTRAVENOUS
  Administered 2015-06-21: 20 mg via INTRAVENOUS

## 2015-06-21 MED ORDER — ACETAMINOPHEN 325 MG PO TABS: 650.0000 mg | ORAL_TABLET | Freq: Four times a day (QID) | ORAL | Status: DC | PRN

## 2015-06-21 MED ORDER — ONDANSETRON HCL 4 MG/2ML IJ SOLN
INTRAMUSCULAR | Status: DC | PRN
  Administered 2015-06-21: 4 mg via INTRAVENOUS

## 2015-06-21 MED ORDER — ROCURONIUM BROMIDE 50 MG/5ML IV SOLN
INTRAVENOUS | Status: AC
  Filled 2015-06-21: qty 1

## 2015-06-21 MED ORDER — POLYETHYLENE GLYCOL 3350 17 G PO PACK: 17.0000 g | PACK | Freq: Every day | ORAL | Status: DC | PRN

## 2015-06-21 MED ORDER — SENNOSIDES-DOCUSATE SODIUM 8.6-50 MG PO TABS: 1.0000 | ORAL_TABLET | Freq: Every evening | ORAL | Status: DC | PRN

## 2015-06-21 MED ORDER — ASPIRIN 81 MG PO CHEW
81.0000 mg | CHEWABLE_TABLET | Freq: Every day | ORAL | Status: DC
  Administered 2015-06-22: 81 mg via ORAL
  Filled 2015-06-21 (×2): qty 1

## 2015-06-21 MED ORDER — VITAMIN E 180 MG (400 UNIT) PO CAPS
400.0000 [IU] | ORAL_CAPSULE | Freq: Every day | ORAL | Status: DC
  Filled 2015-06-21 (×3): qty 1

## 2015-06-21 MED ORDER — DIAZEPAM 5 MG PO TABS: 2.5000 mg | ORAL_TABLET | Freq: Every evening | ORAL | Status: DC | PRN

## 2015-06-21 MED ORDER — CEFAZOLIN SODIUM 1-5 GM-% IV SOLN
1.0000 g | Freq: Four times a day (QID) | INTRAVENOUS | Status: AC
  Administered 2015-06-21 – 2015-06-22 (×3): 1 g via INTRAVENOUS
  Filled 2015-06-21 (×4): qty 50

## 2015-06-21 MED ORDER — MIDAZOLAM HCL 2 MG/2ML IJ SOLN
INTRAMUSCULAR | Status: AC
  Administered 2015-06-21: 1 mg
  Filled 2015-06-21: qty 2

## 2015-06-21 MED ORDER — LEVOCETIRIZINE DIHYDROCHLORIDE 5 MG PO TABS: 5.0000 mg | ORAL_TABLET | Freq: Every evening | ORAL | Status: DC

## 2015-06-21 MED ORDER — LIDOCAINE HCL 4 % MT SOLN
OROMUCOSAL | Status: DC | PRN
  Administered 2015-06-21: 4 mL via TOPICAL

## 2015-06-21 MED ORDER — MAGNESIUM CITRATE PO SOLN: 1.0000 | Freq: Once | ORAL | Status: AC | PRN

## 2015-06-21 MED ORDER — AMLODIPINE BESYLATE 5 MG PO TABS: 5.0000 mg | ORAL_TABLET | Freq: Every day | ORAL | Status: DC

## 2015-06-21 MED ORDER — HYDROCODONE-ACETAMINOPHEN 10-325 MG PO TABS
1.0000 | ORAL_TABLET | ORAL | Status: DC | PRN
  Administered 2015-06-21: 1 via ORAL
  Administered 2015-06-22: 2 via ORAL
  Administered 2015-06-22 (×2): 1 via ORAL
  Filled 2015-06-21 (×2): qty 1
  Filled 2015-06-21: qty 2
  Filled 2015-06-21 (×2): qty 1

## 2015-06-21 MED ORDER — ROCURONIUM BROMIDE 100 MG/10ML IV SOLN
INTRAVENOUS | Status: DC | PRN
  Administered 2015-06-21: 40 mg via INTRAVENOUS

## 2015-06-21 MED ORDER — ROPIVACAINE HCL 5 MG/ML IJ SOLN
INTRAMUSCULAR | Status: DC | PRN
  Administered 2015-06-21: 125 mg via PERINEURAL

## 2015-06-21 MED ORDER — FENTANYL CITRATE (PF) 250 MCG/5ML IJ SOLN
INTRAMUSCULAR | Status: AC
Start: 2015-06-21 — End: 2015-06-21
  Filled 2015-06-21: qty 5

## 2015-06-21 MED ORDER — VITAMIN B-6 100 MG PO TABS
100.0000 mg | ORAL_TABLET | Freq: Every day | ORAL | Status: DC
  Administered 2015-06-21: 100 mg via ORAL
  Filled 2015-06-21 (×3): qty 1

## 2015-06-21 MED ORDER — ALUM & MAG HYDROXIDE-SIMETH 200-200-20 MG/5ML PO SUSP
30.0000 mL | ORAL | Status: DC | PRN
  Administered 2015-06-21: 30 mL via ORAL
  Filled 2015-06-21: qty 30

## 2015-06-21 MED ORDER — ACETAMINOPHEN 650 MG RE SUPP: 650.0000 mg | Freq: Four times a day (QID) | RECTAL | Status: DC | PRN

## 2015-06-21 MED ORDER — HYDROCODONE-ACETAMINOPHEN 10-325 MG PO TABS: 1.0000 | ORAL_TABLET | Freq: Four times a day (QID) | ORAL | Status: DC | PRN

## 2015-06-21 MED ORDER — METHOCARBAMOL 1000 MG/10ML IJ SOLN: 500.0000 mg | Freq: Four times a day (QID) | INTRAVENOUS | Status: DC | PRN

## 2015-06-21 MED ORDER — METHOCARBAMOL 500 MG PO TABS: 500.0000 mg | ORAL_TABLET | Freq: Four times a day (QID) | ORAL | Status: DC | PRN

## 2015-06-21 MED ORDER — SENNA 8.6 MG PO TABS
1.0000 | ORAL_TABLET | Freq: Two times a day (BID) | ORAL | Status: DC
  Administered 2015-06-21 – 2015-06-22 (×2): 8.6 mg via ORAL
  Filled 2015-06-21 (×2): qty 1

## 2015-06-21 MED ORDER — LACTATED RINGERS IV SOLN
INTRAVENOUS | Status: DC
  Administered 2015-06-21 (×2): via INTRAVENOUS

## 2015-06-21 MED ORDER — BISACODYL 10 MG RE SUPP: 10.0000 mg | Freq: Every day | RECTAL | Status: DC | PRN

## 2015-06-21 MED ORDER — EPHEDRINE SULFATE 50 MG/ML IJ SOLN
INTRAMUSCULAR | Status: DC | PRN
  Administered 2015-06-21: 5 mg via INTRAVENOUS
  Administered 2015-06-21: 2.5 mg via INTRAVENOUS

## 2015-06-21 MED ORDER — FENTANYL CITRATE (PF) 100 MCG/2ML IJ SOLN
INTRAMUSCULAR | Status: AC
  Administered 2015-06-21: 100 ug via INTRAVENOUS
  Filled 2015-06-21: qty 2

## 2015-06-21 MED ORDER — CALCIUM CARBONATE-VITAMIN D 500-200 MG-UNIT PO TABS
1.0000 | ORAL_TABLET | Freq: Two times a day (BID) | ORAL | Status: DC
  Administered 2015-06-21 – 2015-06-22 (×2): 1 via ORAL
  Filled 2015-06-21 (×2): qty 1

## 2015-06-21 MED ORDER — PANTOPRAZOLE SODIUM 40 MG PO TBEC
40.0000 mg | DELAYED_RELEASE_TABLET | Freq: Every day | ORAL | Status: DC
  Administered 2015-06-22: 40 mg via ORAL
  Filled 2015-06-21: qty 1

## 2015-06-21 MED ORDER — ONDANSETRON HCL 4 MG/2ML IJ SOLN
4.0000 mg | Freq: Four times a day (QID) | INTRAMUSCULAR | Status: DC | PRN
  Administered 2015-06-21 (×2): 4 mg via INTRAVENOUS
  Filled 2015-06-21 (×2): qty 2

## 2015-06-21 MED ORDER — PHENYLEPHRINE 40 MCG/ML (10ML) SYRINGE FOR IV PUSH (FOR BLOOD PRESSURE SUPPORT)
PREFILLED_SYRINGE | INTRAVENOUS | Status: AC
  Filled 2015-06-21: qty 10

## 2015-06-21 MED ORDER — SODIUM CHLORIDE 0.9 % IJ SOLN
INTRAMUSCULAR | Status: AC
  Filled 2015-06-21: qty 20

## 2015-06-21 SURGICAL SUPPLY — 68 items
BIT DRILL 5/64X5 DISP (BIT) ×3 IMPLANT
BIT DRILL QUICK REL 1/8 2PK SL (DRILL) IMPLANT
BLADE SAW SGTL MED 73X18.5 STR (BLADE) ×3 IMPLANT
BRUSH FEMORAL CANAL (MISCELLANEOUS) IMPLANT
CAPT SHLDR TOTAL 2 ×2 IMPLANT
CEMENT BONE DEPUY (Cement) ×3 IMPLANT
CLOSURE STERI-STRIP 1/2X4 (GAUZE/BANDAGES/DRESSINGS) ×1
CLSR STERI-STRIP ANTIMIC 1/2X4 (GAUZE/BANDAGES/DRESSINGS) ×2 IMPLANT
COVER SURGICAL LIGHT HANDLE (MISCELLANEOUS) ×3 IMPLANT
COVER TABLE BACK 60X90 (DRAPES) IMPLANT
DRAPE ORTHO SPLIT 77X108 STRL (DRAPES) ×6
DRAPE PROXIMA HALF (DRAPES) ×3 IMPLANT
DRAPE SURG ORHT 6 SPLT 77X108 (DRAPES) ×2 IMPLANT
DRAPE U-SHAPE 47X51 STRL (DRAPES) ×3 IMPLANT
DRESSING ALLEVYN LIFE SACRUM (GAUZE/BANDAGES/DRESSINGS) ×2 IMPLANT
DRILL QUICK RELEASE 1/8 INCH (DRILL) ×2
DRSG MEPILEX BORDER 4X8 (GAUZE/BANDAGES/DRESSINGS) ×3 IMPLANT
DURAPREP 26ML APPLICATOR (WOUND CARE) ×3 IMPLANT
ELECT REM PT RETURN 9FT ADLT (ELECTROSURGICAL) ×3
ELECTRODE REM PT RTRN 9FT ADLT (ELECTROSURGICAL) ×1 IMPLANT
EVACUATOR 1/8 PVC DRAIN (DRAIN) IMPLANT
FACESHIELD WRAPAROUND (MASK) ×3 IMPLANT
FACESHIELD WRAPAROUND OR TEAM (MASK) ×1 IMPLANT
GLOVE BIOGEL PI IND STRL 8 (GLOVE) ×1 IMPLANT
GLOVE BIOGEL PI INDICATOR 8 (GLOVE) ×2
GLOVE BIOGEL PI ORTHO PRO SZ8 (GLOVE) ×2
GLOVE ORTHO TXT STRL SZ7.5 (GLOVE) ×3 IMPLANT
GLOVE PI ORTHO PRO STRL SZ8 (GLOVE) ×1 IMPLANT
GLOVE SURG ORTHO 8.0 STRL STRW (GLOVE) ×6 IMPLANT
GOWN STRL REUS W/ TWL LRG LVL3 (GOWN DISPOSABLE) ×1 IMPLANT
GOWN STRL REUS W/ TWL XL LVL3 (GOWN DISPOSABLE) ×1 IMPLANT
GOWN STRL REUS W/TWL 2XL LVL3 (GOWN DISPOSABLE) ×3 IMPLANT
GOWN STRL REUS W/TWL LRG LVL3 (GOWN DISPOSABLE) ×3
GOWN STRL REUS W/TWL XL LVL3 (GOWN DISPOSABLE) ×3
HANDPIECE INTERPULSE COAX TIP (DISPOSABLE) ×3
HOOD PEEL AWAY FACE SHEILD DIS (HOOD) ×3 IMPLANT
KIT BASIN OR (CUSTOM PROCEDURE TRAY) ×3 IMPLANT
KIT ROOM TURNOVER OR (KITS) ×3 IMPLANT
MANIFOLD NEPTUNE II (INSTRUMENTS) ×3 IMPLANT
NDL 1/2 CIR CATGUT .05X1.09 (NEEDLE) IMPLANT
NDL HYPO 25GX1X1/2 BEV (NEEDLE) IMPLANT
NEEDLE 1/2 CIR CATGUT .05X1.09 (NEEDLE) IMPLANT
NEEDLE HYPO 25GX1X1/2 BEV (NEEDLE) IMPLANT
NS IRRIG 1000ML POUR BTL (IV SOLUTION) ×3 IMPLANT
PACK SHOULDER (CUSTOM PROCEDURE TRAY) ×3 IMPLANT
PAD ARMBOARD 7.5X6 YLW CONV (MISCELLANEOUS) ×6 IMPLANT
PIN HUMERAL STMN 3.2MMX9IN (INSTRUMENTS) ×2 IMPLANT
SET HNDPC FAN SPRY TIP SCT (DISPOSABLE) ×1 IMPLANT
SLING ARM IMMOBILIZER LRG (SOFTGOODS) IMPLANT
SLING ARM IMMOBILIZER MED (SOFTGOODS) ×2 IMPLANT
SMARTMIX MINI TOWER (MISCELLANEOUS) ×3
SPONGE LAP 18X18 X RAY DECT (DISPOSABLE) ×3 IMPLANT
SUCTION FRAZIER TIP 10 FR DISP (SUCTIONS) ×3 IMPLANT
SUPPORT WRAP ARM LG (MISCELLANEOUS) ×3 IMPLANT
SUT FIBERWIRE #2 38 REV NDL BL (SUTURE)
SUT MAXBRAID (SUTURE) IMPLANT
SUT MNCRL AB 4-0 PS2 18 (SUTURE) IMPLANT
SUT VIC AB 0 CT1 27 (SUTURE) ×3
SUT VIC AB 0 CT1 27XBRD ANBCTR (SUTURE) ×1 IMPLANT
SUT VIC AB 2-0 CT1 27 (SUTURE)
SUT VIC AB 2-0 CT1 TAPERPNT 27 (SUTURE) IMPLANT
SUT VIC AB 3-0 SH 8-18 (SUTURE) ×3 IMPLANT
SUTURE FIBERWR#2 38 REV NDL BL (SUTURE) IMPLANT
SYR CONTROL 10ML LL (SYRINGE) IMPLANT
TOWEL OR 17X24 6PK STRL BLUE (TOWEL DISPOSABLE) ×3 IMPLANT
TOWEL OR 17X26 10 PK STRL BLUE (TOWEL DISPOSABLE) ×3 IMPLANT
TOWER SMARTMIX MINI (MISCELLANEOUS) ×1 IMPLANT
WATER STERILE IRR 1000ML POUR (IV SOLUTION) ×3 IMPLANT

## 2015-06-21 NOTE — Transfer of Care (Signed)
Immediate Anesthesia Transfer of Care Note  Patient: Christine Leblanc  Procedure(s) Performed: Procedure(s): RIGHT TOTAL SHOULDER ARTHROPLASTY (Right)  Patient Location: PACU  Anesthesia Type:General  Level of Consciousness: oriented and sedated  Airway & Oxygen Therapy: Patient Spontanous Breathing and Patient connected to nasal cannula oxygen  Post-op Assessment: Report given to RN and Post -op Vital signs reviewed and stable  Post vital signs: Reviewed and stable  Last Vitals:  Filed Vitals:   06/21/15 0955  BP: 144/69  Pulse: 74  Temp:   Resp: 20    Complications: No apparent anesthesia complications

## 2015-06-21 NOTE — Discharge Instructions (Signed)
Diet: As you were doing prior to hospitalization  ° °Shower:  May shower but keep the wounds dry, use an occlusive plastic wrap, NO SOAKING IN TUB.  If the bandage gets wet, change with a clean dry gauze. ° °Dressing:  You may change your dressing 3-5 days after surgery.  Then change the dressing daily with sterile gauze dressing.   ° °There are sticky tapes (steri-strips) on your wounds and all the stitches are absorbable.  Leave the steri-strips in place when changing your dressings, they will peel off with time, usually 2-3 weeks. ° °Activity:  Increase activity slowly as tolerated, but follow the weight bearing instructions below.  No lifting or driving for 6 weeks. ° °Weight Bearing:   Sling at all times..   ° °To prevent constipation: you may use a stool softener such as - ° °Colace (over the counter) 100 mg by mouth twice a day  °Drink plenty of fluids (prune juice may be helpful) and high fiber foods °Miralax (over the counter) for constipation as needed.   ° °Itching:  If you experience itching with your medications, try taking only a single pain pill, or even half a pain pill at a time.  You may take up to 10 pain pills per day, and you can also use benadryl over the counter for itching or also to help with sleep.  ° °Precautions:  If you experience chest pain or shortness of breath - call 911 immediately for transfer to the hospital emergency department!! ° °If you develop a fever greater that 101 F, purulent drainage from wound, increased redness or drainage from wound, or calf pain -- Call the office at [phone]                                                °Follow- Up Appointment:  Please call for an appointment to be seen in 2 weeks [place] - [phone] ° ° ° ° ° °

## 2015-06-21 NOTE — Plan of Care (Signed)
Problem: Consults Goal: Diagnosis - Shoulder Surgery Outcome: Completed/Met Date Met:  06/21/15 Total Shoulder Arthroplasty

## 2015-06-21 NOTE — Progress Notes (Signed)
Utilization review completed.  

## 2015-06-21 NOTE — Anesthesia Postprocedure Evaluation (Signed)
  Anesthesia Post-op Note  Patient: Christine Leblanc  Procedure(s) Performed: Procedure(s): RIGHT TOTAL SHOULDER ARTHROPLASTY (Right)  Patient Location: PACU  Anesthesia Type: General   Level of Consciousness: awake, alert  and oriented  Airway and Oxygen Therapy: Patient Spontanous Breathing  Post-op Pain: mild  Post-op Assessment: Post-op Vital signs reviewed  Post-op Vital Signs: Reviewed  Last Vitals:  Filed Vitals:   06/21/15 1540  BP: 118/57  Pulse: 92  Temp: 36.6 C  Resp: 16    Complications: No apparent anesthesia complications

## 2015-06-21 NOTE — Op Note (Signed)
06/21/2015  12:33 PM  PATIENT:  Christine Leblanc    PRE-OPERATIVE DIAGNOSIS:  Right shoulder glenohumeral osteoarthritis  POST-OPERATIVE DIAGNOSIS:  Same  PROCEDURE:  RIGHT TOTAL SHOULDER ARTHROPLASTY  SURGEON:  Johnny Bridge, MD  PHYSICIAN ASSISTANT: Joya Gaskins, OPA-C, present and scrubbed throughout the case, critical for completion in a timely fashion, and for retraction, instrumentation, and closure.  ANESTHESIA:   General  PREOPERATIVE INDICATIONS:  Christine Leblanc is a  81 y.o. female with a diagnosis of DJD RT SHOULDER who failed conservative measures and elected for surgical management.    The risks benefits and alternatives were discussed with the patient preoperatively including but not limited to the risks of infection, bleeding, nerve injury, cardiopulmonary complications, the need for revision surgery, dislocation, loosening, incomplete relief of pain, among others, and the patient was willing to proceed.   OPERATIVE IMPLANTS: Biomet size 11 mini press-fit humeral stem, size 46+21 Versa-dial humeral head, set in the C position with increased coverage superiorly, with a small cemented glenoid polyethylene 3 peg implant with a central regenerex noncemented post.   OPERATIVE FINDINGS: Advanced glenohumeral osteoarthritis involving the glenoid and the humeral head with substantial osteophyte formation inferiorly. There was extreme posterior glenoid wear with erosion. The soft tissue laxity was fairly significant.  There was some redundant tissue with the subscapularis. The bone quality was quite poor, and I was able to pass suture through the lesser tuberosity for subscapularis repair without much difficulty. Additionally, the glenoid was very small, and had severe wear, and the degenerative exposed was bicortical, the anterior hole was bicortical, and the posterior and superior holes were unicortical.   OPERATIVE PROCEDURE: The patient was brought to the operating room and  placed in the supine position. General anesthesia was administered. IV antibiotics were given.  The upper extremity was prepped and draped in usual sterile fashion. The patient was in a beachchair position with all bony prominences padded.   Time out was performed and a deltopectoral approach was carried out. The biceps tendon was tenodesed to the pectoralis tendon. The subscapularis was released, tagging it with a #2 FiberWire, leaving a cuff of tendon for repair.   The inferior osteophyte was removed, and release of the capsule off of the humeral side was completed. The head was dislocated, and I reamed sequentially. I placed the humeral cutting guide at 30 of retroversion, and then pinned this into place, and made my humeral neck cut. This was at the appropriate level. Access to the glenoid was not quite adequate, and so I did a second resection of the proximal humerus. The glenoid and humerus was very delicate, and I tried to minimize the amount of forces applied.  I then placed deep retractors and exposed the glenoid. I excised the labrum circumferentially, taking care to protect the axillary nerve inferiorly.   I then placed a guidewire into the center position, controlling appropriate version and inclination. I removed much more bone anteriorly than posteriorly, and in fact didn't really take anything posteriorly. I then reamed over the guidewire with the small reamer, and was satisfied with the preparation. I preserved the subchondral bone in order to maximize the strength and minimize the risk for subsequent subsidence.   I then drilled the central hole for the regenerex peg, and then placed the guide, and then drilled the 3 peripheral peg holes. I had excellent bony circumferential contact.   I then cleaned the glenoid, irrigated it copiously, and then dried it and cemented the prosthesis into  place. Excellent seating was achieved. I had full exposure. The cement cured, and then I turned my  attention to the humeral side. The glenoid bone stock was fairly tenuous, but in the end I was satisfied with my fixation.  I sequentially broached, up to the selected size, with the broach set at 30 of retroversion. I then placed the real stem. I trialed with multiple heads, and the above-named component was selected. Increased posterior coverage improved the coverage. Initially I had planned to use a size 46+18 head, however this was inadvertently contaminated, and not able to be used. I trialed with a 21, which demonstrated potentially even better restoration of soft tissue tension, as the amount of medial wear was significant. Therefore I selected this implant. The soft tissue tension was appropriate.   I then impacted the real humeral head into place, reduced the head, and irrigated copiously. Excellent stability and range of motion was achieved. I repaired the subscapularis with 4 #2 Fiberwire, as well as the rotator interval, and irrigated copiously once more. The subcutaneous tissue was closed with Vicryl including the deltopectoral fascia.   The skin was closed with Steri-Strips and sterile gauze was applied. She had a preoperative nerve block. She tolerated the procedure well and there were no complications.

## 2015-06-21 NOTE — Plan of Care (Signed)
Problem: Consults Goal: Diagnosis - Shoulder Surgery Total Shoulder Arthroplasty: Right     

## 2015-06-21 NOTE — Anesthesia Procedure Notes (Addendum)
Anesthesia Regional Block:  Interscalene brachial plexus block  Pre-Anesthetic Checklist: ,, timeout performed, Correct Patient, Correct Site, Correct Laterality, Correct Procedure, Correct Position, site marked, Risks and benefits discussed,  Surgical consent,  Pre-op evaluation,  At surgeon's request and post-op pain management  Laterality: Right  Prep: chloraprep       Needles:  Injection technique: Single-shot  Needle Type: Echogenic Stimulator Needle     Needle Length: 5cm 5 cm Needle Gauge: 22 and 22 G    Additional Needles:  Procedures: ultrasound guided (picture in chart) and nerve stimulator Interscalene brachial plexus block  Nerve Stimulator or Paresthesia:  Response: bicep contraction, 0.45 mA,   Additional Responses:   Narrative:  Start time: 06/21/2015 9:32 AM End time: 06/21/2015 9:42 AM Injection made incrementally with aspirations every 5 mL.  Performed by: Personally  Anesthesiologist: Duane Boston  Additional Notes: Functioning IV was confirmed and monitors applied.  A 54m 22ga echogenic arrow stimulator was used. Sterile prep and drape,hand hygiene and sterile gloves were used.Ultrasound guidance: relevant anatomy identified, needle position confirmed, local anesthetic spread visualized around nerve(s)., vascular puncture avoided.  Image printed for medical record.  Negative aspiration and negative test dose prior to incremental administration of local anesthetic. The patient tolerated the procedure well.   Anesthesia Procedure Image Ultrasound image for guided ISB Procedure Name: Intubation Date/Time: 06/21/2015 10:42 AM Performed by: RMerdis DelayPre-anesthesia Checklist: Timeout performed, Emergency Drugs available, Suction available, Patient being monitored and Patient identified Patient Re-evaluated:Patient Re-evaluated prior to inductionOxygen Delivery Method: Circle system utilized Preoxygenation: Pre-oxygenation with 100%  oxygen Intubation Type: IV induction Ventilation: Mask ventilation without difficulty Laryngoscope Size: Mac and 3 Grade View: Grade I Tube type: Oral Tube size: 7.0 mm Number of attempts: 1 Airway Equipment and Method: Stylet and LTA kit utilized Placement Confirmation: ETT inserted through vocal cords under direct vision,  breath sounds checked- equal and bilateral,  positive ETCO2 and CO2 detector Secured at: 22 cm Tube secured with: Tape Dental Injury: Teeth and Oropharynx as per pre-operative assessment

## 2015-06-21 NOTE — Anesthesia Preprocedure Evaluation (Addendum)
Anesthesia Evaluation  Patient identified by MRN, date of birth, ID band Patient awake    Reviewed: Allergy & Precautions, NPO status , Patient's Chart, lab work & pertinent test results  History of Anesthesia Complications Negative for: history of anesthetic complications  Airway Mallampati: I  TM Distance: >3 FB     Dental  (+) Edentulous Upper, Poor Dentition, Dental Advisory Given   Pulmonary asthma , former smoker,    Pulmonary exam normal       Cardiovascular hypertension, Normal cardiovascular exam    Neuro/Psych Remote history of Sz negative neurological ROS  negative psych ROS   GI/Hepatic Neg liver ROS, GERD-  ,  Endo/Other  Hypothyroidism   Renal/GU negative Renal ROS     Musculoskeletal  (+) Arthritis -,   Abdominal   Peds  Hematology   Anesthesia Other Findings   Reproductive/Obstetrics                            Anesthesia Physical Anesthesia Plan  ASA: III  Anesthesia Plan: General   Post-op Pain Management:    Induction: Intravenous  Airway Management Planned: Oral ETT  Additional Equipment:   Intra-op Plan:   Post-operative Plan: Extubation in OR  Informed Consent: I have reviewed the patients History and Physical, chart, labs and discussed the procedure including the risks, benefits and alternatives for the proposed anesthesia with the patient or authorized representative who has indicated his/her understanding and acceptance.   Dental advisory given  Plan Discussed with: CRNA, Anesthesiologist and Surgeon  Anesthesia Plan Comments:         Anesthesia Quick Evaluation

## 2015-06-21 NOTE — H&P (Signed)
PREOPERATIVE H&P  Chief Complaint: DJD RT SHOULDER  HPI: Christine Leblanc is a 81 y.o. female who presents for preoperative history and physical with a diagnosis of DJD RT SHOULDER. Symptoms are rated as moderate to severe, and have been worsening.  This is significantly impairing activities of daily living.  She has elected for surgical management.   She has failed injections, activity modification, anti-inflammatories, and assistive devices such as a sling.  Preoperative X-rays demonstrate end stage degenerative changes with osteophyte formation, loss of joint space, subchondral sclerosis.  Past Medical History  Diagnosis Date  . Syncope and collapse 2007    Possible CVA in 2012 with left lower extremity weakness; refused hospitalization; CT-Atrophy and chronic microvascular ischemic change.   . Asthma   . Gastroesophageal reflux disease     Hiatal hernia; distal esophageal web requiring dilatation; gastric polyps; gastritis; refuses colonoscopy  . Lymphocytic thyroiditis   . Multiple thyroid nodules 2010    Adenomatous; thyroidectomy in 2010  . Hyperlipidemia 04/27/2011  . Anemia   . GERD (gastroesophageal reflux disease)   . Chronic neck pain   . Chronic back pain   . Pneumonia     hosp. for pneumonia- long time ago   . Hypothyroidism   . Seizures     yes- as a child- & into adult years, states she took med. for them at one time, stopped at 32 yrs. of age   . Degenerative joint disease     Left shoulder; cervical spine, knees & hands   . Cancer     skin Ca- ? basal cell   . History of stress test 1990's    stress test done under the care of Dr. Lattie Haw & Dr. Gwenlyn Found, now being followed by Dr. Harl Bowie- in [place] , recently seen & told to f/U in one yr.   . Hypertension   . Osteoarthritis of right knee 07/27/2014   Past Surgical History  Procedure Laterality Date  . Abdominal hysterectomy      fibroids  . Prolapsed uterine fibroid ligation      outcomed with rectocele &  cystocele  . Total thyroidectomy  2010  . Esophagogastroduodenoscopy  12/10    Dr. 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 Cataract extraction      Bilateral; redo surgery on the right for incomplete primary procedure  . Colonoscopy  Remote  . Shoulder surgery      left  . Total knee arthroplasty Right 07/27/2014    Procedure: RIGHT TOTAL KNEE ARTHROPLASTY;  Surgeon: Johnny Bridge, MD;  Location: [facility];  Service: Orthopedics;  Laterality: Right;  . Esophagogastroduodenoscopy N/A 03/21/2015    Procedure: ESOPHAGOGASTRODUODENOSCOPY (EGD);  Surgeon: Danie Binder, MD;  [facility]: [facility];  Service: Endoscopy;  Laterality: N/A;  1130   . [place] dilation N/A 03/21/2015    Procedure: SAVORY DILATION;  Surgeon: Danie Binder, MD;  Location: [facility];  Service: Endoscopy;  Laterality: N/A;  . Eye surgery     History   Social History  . Marital Status: Widowed    Spouse Name: N/A  . Number of Children: 4  . Years of Education: N/A   Occupational History  . 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 Retired     Marine scientist   Social History Main Topics  . Smoking status: Former Smoker -- 0.80 packs/day for 20 years    Types: Cigarettes  Quit date: 12/31/1978  . Smokeless tobacco: Never Used  . Alcohol Use: No  . Drug Use: No  . Sexual Activity: Not on file   Other Topics Concern  . None   Social History Narrative   Family History  Problem Relation Age of Onset  . Heart disease Mother   . Gallbladder disease Mother   . Breast cancer Sister   . Colon cancer Neg Hx    Allergies  Allergen Reactions  . Celecoxib Shortness Of Breath   Prior to Admission medications   Medication Sig Start Date End Date Taking? Authorizing Provider  amLODipine (NORVASC) 2.5 MG tablet Take 2.5 mg by mouth daily. 06/01/15  Yes Historical Provider, MD  amLODipine (NORVASC) 5 MG tablet Take 5 mg by mouth daily.   Yes Historical Provider,  MD  Ascorbic Acid (VITAMIN C PO) Take 1 tablet by mouth daily.   Yes Historical Provider, MD  aspirin 81 MG tablet Take 81 mg by mouth daily.   Yes Historical Provider, MD  calcium-vitamin D (OSCAL WITH D) 500-200 MG-UNIT per tablet Take 1 tablet by mouth 2 (two) times daily.    Yes Historical Provider, MD  dexlansoprazole (DEXILANT) 60 MG capsule Take 1 capsule (60 mg total) by mouth daily. WITH BREAKFAST 11/29/14  Yes Danie Binder, MD  diazepam (VALIUM) 5 MG tablet Take one tablet by mouth at bedtime as needed for sleep Patient taking differently: Take 2.5-5 mg by mouth at bedtime as needed (sleep).  08/04/14  Yes Estill Dooms, MD  etodolac (LODINE) 300 MG capsule Take 300 mg by mouth 2 (two) times daily.   Yes Historical Provider, MD  Glucosamine HCl (GLUCOSAMINE PO) Take 1 tablet by mouth 2 (two) times daily.   Yes Historical Provider, MD  levocetirizine (XYZAL) 5 MG tablet Take 5 mg by mouth every evening.     Yes Historical Provider, MD  levothyroxine (SYNTHROID, LEVOTHROID) 100 MCG tablet TAKE 1 TABLET DAILY Patient taking differently: TAKE 1 TABLET DAILY BEFORE BREAKFAST 03/08/15  Yes Elayne Snare, MD  montelukast (SINGULAIR) 10 MG tablet Take 10 mg by mouth at bedtime.   Yes Historical Provider, MD  Omega-3 Fatty Acids (FISH OIL) 1200 MG CAPS Take 1 capsule by mouth 2 (two) times daily.    Yes Historical Provider, MD  pravastatin (PRAVACHOL) 80 MG tablet TAKE 1 TABLET DAILY Patient taking differently: TAKE 1 TABLET DAILY IN THE EVENING 02/21/15  Yes Arnoldo Lenis, MD  pyridOXINE (VITAMIN B-6) 100 MG tablet Take 100 mg by mouth at bedtime.   Yes Historical Provider, MD  Sennosides-Docusate Sodium (PERI-COLACE PO) Take 1 tablet by mouth 2 (two) times daily.    Yes Historical Provider, MD  vitamin B-12 (CYANOCOBALAMIN) 1000 MCG tablet Take 1,000 mcg by mouth every morning.   Yes Historical Provider, MD  vitamin E 400 UNIT capsule Take 400 Units by mouth at bedtime.    Yes Historical  Provider, MD  albuterol (PROVENTIL HFA) 108 (90 BASE) MCG/ACT inhaler Inhale 1-2 puffs into the lungs every 6 (six) hours as needed for wheezing or shortness of breath. Wheezing, Asthma Symptoms    Historical Provider, MD     Positive ROS: All other systems have been reviewed and were otherwise negative with the exception of those mentioned in the HPI and as above.  Physical Exam: General: Alert, no acute distress Cardiovascular: No pedal edema Respiratory: No cyanosis, no use of accessory musculature GI: No organomegaly, abdomen is soft and non-tender Skin: No lesions in  the area of chief complaint Neurologic: Sensation intact distally Psychiatric: Patient is competent for consent with normal mood and affect Lymphatic: No axillary or cervical lymphadenopathy  MUSCULOSKELETAL: Right shoulder active motion is 0-80 of external rotation to neutral. Cuff strength seems to be intact, although limited secondary to stiffness. Positive crepitance.  Assessment: DJD RT SHOULDER  Plan: Plan for Procedure(s): RIGHT TOTAL SHOULDER ARTHROPLASTY  The risks benefits and alternatives were discussed with the patient including but not limited to the risks of nonoperative treatment, versus surgical intervention including infection, bleeding, nerve injury,  blood clots, cardiopulmonary complications, morbidity, mortality, among others, and they were willing to proceed.   Johnny Bridge, MD Cell [phone]   06/21/2015 9:40 AM

## 2015-06-22 ENCOUNTER — Encounter (HOSPITAL_COMMUNITY): Payer: Self-pay | Admitting: Orthopedic Surgery

## 2015-06-22 LAB — BASIC METABOLIC PANEL
Anion gap: 10 (ref 5–15)
BUN: 19 mg/dL (ref 6–20)
CHLORIDE: 102 mmol/L (ref 101–111)
CO2: 26 mmol/L (ref 22–32)
Calcium: 8.6 mg/dL — ABNORMAL LOW (ref 8.9–10.3)
Creatinine, Ser: 0.8 mg/dL (ref 0.44–1.00)
GFR calc Af Amer: >60 mL/min (ref >60)
GFR calc non Af Amer: >60 mL/min (ref >60)
GLUCOSE: 144 mg/dL — AB (ref 65–99)
Potassium: 3.8 mmol/L (ref 3.5–5.1)
Sodium: 138 mmol/L (ref 135–145)

## 2015-06-22 LAB — CBC
HEMATOCRIT: 27.9 % — AB (ref 36.0–46.0)
Hemoglobin: 9.4 g/dL — ABNORMAL LOW (ref 12.0–15.0)
MCH: 30.8 pg (ref 26.0–34.0)
MCHC: 33.7 g/dL (ref 30.0–36.0)
MCV: 91.5 fL (ref 78.0–100.0)
Platelets: 132 10*3/uL — ABNORMAL LOW (ref 150–400)
RBC: 3.05 MIL/uL — ABNORMAL LOW (ref 3.87–5.11)
RDW: 13.8 % (ref 11.5–15.5)
WBC: 9.1 10*3/uL (ref 4.0–10.5)

## 2015-06-22 NOTE — Progress Notes (Signed)
Patient ID: Christine Leblanc, female   DOB: 08-Feb-1929, 81 y.o.   MRN: [id number]     Subjective:  Patient reports pain as mild to moderate.  Patient states that the block has worn off ad that the shoulder is hurting some now.  Objective:   VITALS:   Filed Vitals:   06/21/15 1511 06/21/15 1540 06/21/15 2050 06/22/15 0647  BP:  118/57 117/55 125/51  Pulse:  92 101 92  Temp: 98.2 F (36.8 C) 97.8 F (36.6 C) 98.1 F (36.7 C) 97.6 F (36.4 C)  TempSrc:  Oral Oral Oral  Resp:  16 16 18   Weight:      SpO2:  98% 97% 92%    ABD soft Sensation intact distally Dorsiflexion/Plantar flexion intact Incision: dressing C/D/I and no drainage Normal wrist and hand motion  Lab Results  Component Value Date   WBC 9.1 06/22/2015   HGB 9.4* 06/22/2015   HCT 27.9* 06/22/2015   MCV 91.5 06/22/2015   PLT 132* 06/22/2015   BMET    Component Value Date/Time   NA 140 06/13/2015 1455   K 3.9 06/13/2015 1455   CL 108 06/13/2015 1455   CO2 25 06/13/2015 1455   GLUCOSE 132* 06/13/2015 1455   BUN 24* 06/13/2015 1455   CREATININE 0.77 06/13/2015 1455   CREATININE 0.76 05/01/2011 0945   CALCIUM 9.1 06/13/2015 1455   GFRNONAA >60 06/13/2015 1455   GFRAA >60 06/13/2015 1455     Assessment/Plan: 1 Day Post-Op   Principal Problem:   Primary localized osteoarthrosis of right shoulder Active Problems:   S/P shoulder replacement   Advance diet Up with therapy Patient would like to go home later today Sling at all times and no shoulder motion  Okay for elbow, wrist and hand motion    DOUGLAS PARRY, BRANDON 06/22/2015, 7:41 AM  Discussed and agree with above.  Luna for DC today if pain conrolled.  Marchia Bond, MD Cell [phone]

## 2015-06-22 NOTE — Progress Notes (Signed)
Pt being discharged home via wheelchair with family. Pt alert and oriented with periods of confusion. VSS. Pt c/o no pain at this time. No signs of respiratory distress. Pt sling in place. Education complete and care plans resolved. IV removed with catheter intact and pt tolerated well. No further issues at this time. Pt to follow up with PCP. Leanne Chang, RN

## 2015-06-22 NOTE — Discharge Summary (Signed)
Physician Discharge Summary  Patient ID: Christine Leblanc MRN: [id number] DOB/AGE: May 18, 1929 81 y.o.  Admit date: 06/21/2015 Discharge date: 06/22/2015  Admission Diagnoses:  Primary localized osteoarthrosis of right shoulder  Discharge Diagnoses:  Principal Problem:   Primary localized osteoarthrosis of right shoulder Active Problems:   S/P shoulder replacement   Past Medical History  Diagnosis Date  . Syncope and collapse 2007    Possible CVA in 2012 with left lower extremity weakness; refused hospitalization; CT-Atrophy and chronic microvascular ischemic change.   . Asthma   . Gastroesophageal reflux disease     Hiatal hernia; distal esophageal web requiring dilatation; gastric polyps; gastritis; refuses colonoscopy  . Lymphocytic thyroiditis   . Multiple thyroid nodules 2010    Adenomatous; thyroidectomy in 2010  . Hyperlipidemia 04/27/2011  . Anemia   . GERD (gastroesophageal reflux disease)   . Chronic neck pain   . Chronic back pain   . Pneumonia     hosp. for pneumonia- long time ago   . Hypothyroidism   . Seizures     yes- as a child- & into adult years, states she took med. for them at one time, stopped at 32 yrs. of age   . Degenerative joint disease     Left shoulder; cervical spine, knees & hands   . Cancer     skin Ca- ? basal cell   . History of stress test 1990's    stress test done under the care of Dr. Lattie Haw & Dr. Gwenlyn Found, now being followed by Dr. Harl Bowie- in [place] , recently seen & told to f/U in one yr.   . Hypertension   . Osteoarthritis of right knee 07/27/2014  . Primary localized osteoarthrosis of right shoulder 06/21/2015    Surgeries: Procedure(s): RIGHT TOTAL SHOULDER ARTHROPLASTY on 06/21/2015   Consultants (if any):    Discharged Condition: Improved  Hospital Course: ARALY KAAS is an 81 y.o. female who was admitted 06/21/2015 with a diagnosis of Primary localized osteoarthrosis of right shoulder and went to the operating room on  06/21/2015 and underwent the above named procedures.    She was given perioperative antibiotics:  Anti-infectives    Start     Dose/Rate Route Frequency Ordered Stop   06/21/15 1630  ceFAZolin (ANCEF) IVPB 1 g/50 mL premix     1 g 100 mL/hr over 30 Minutes Intravenous Every 6 hours 06/21/15 1530 06/22/15 0445   06/21/15 1000  ceFAZolin (ANCEF) IVPB 2 g/50 mL premix     2 g 100 mL/hr over 30 Minutes Intravenous To ShortStay Surgical 06/20/15 1401 06/21/15 1036    .  She was given sequential compression devices, early ambulation for DVT prophylaxis.  She benefited maximally from the hospital stay and there were no complications.    Recent vital signs:  Filed Vitals:   06/22/15 0647  BP: 125/51  Pulse: 92  Temp: 97.6 F (36.4 C)  Resp: 18    Recent laboratory studies:  Lab Results  Component Value Date   HGB 9.4* 06/22/2015   HGB 11.1* 06/13/2015   HGB 11.3* 12/07/2014   Lab Results  Component Value Date   WBC 9.1 06/22/2015   PLT 132* 06/22/2015   Lab Results  Component Value Date   INR 1.08 07/19/2014   Lab Results  Component Value Date   NA 138 06/22/2015   K 3.8 06/22/2015   CL 102 06/22/2015   CO2 26 06/22/2015   BUN 19 06/22/2015   CREATININE 0.80 06/22/2015  GLUCOSE 144* 06/22/2015    Discharge Medications:     Medication List    STOP taking these medications        etodolac 300 MG capsule  Commonly known as:  LODINE      TAKE these medications        amLODipine 5 MG tablet  Commonly known as:  NORVASC  Take 5 mg by mouth daily.     amLODipine 2.5 MG tablet  Commonly known as:  NORVASC  Take 2.5 mg by mouth daily.     aspirin 81 MG tablet  Take 81 mg by mouth daily.     baclofen 10 MG tablet  Commonly known as:  LIORESAL  Take 1 tablet (10 mg total) by mouth 3 (three) times daily. As needed for muscle spasm     calcium-vitamin D 500-200 MG-UNIT per tablet  Commonly known as:  OSCAL WITH D  Take 1 tablet by mouth 2 (two) times  daily.     dexlansoprazole 60 MG capsule  Commonly known as:  DEXILANT  Take 1 capsule (60 mg total) by mouth daily. WITH BREAKFAST     diazepam 5 MG tablet  Commonly known as:  VALIUM  Take one tablet by mouth at bedtime as needed for sleep     Fish Oil 1200 MG Caps  Take 1 capsule by mouth 2 (two) times daily.     GLUCOSAMINE PO  Take 1 tablet by mouth 2 (two) times daily.     HYDROcodone-acetaminophen 10-325 MG per tablet  Commonly known as:  NORCO  Take 1-2 tablets by mouth every 6 (six) hours as needed.     levocetirizine 5 MG tablet  Commonly known as:  XYZAL  Take 5 mg by mouth every evening.     levothyroxine 100 MCG tablet  Commonly known as:  SYNTHROID, LEVOTHROID  TAKE 1 TABLET DAILY     montelukast 10 MG tablet  Commonly known as:  SINGULAIR  Take 10 mg by mouth at bedtime.     PERI-COLACE PO  Take 1 tablet by mouth 2 (two) times daily.     pravastatin 80 MG tablet  Commonly known as:  PRAVACHOL  TAKE 1 TABLET DAILY     PROVENTIL HFA 108 (90 BASE) MCG/ACT inhaler  Generic drug:  albuterol  Inhale 1-2 puffs into the lungs every 6 (six) hours as needed for wheezing or shortness of breath. Wheezing, Asthma Symptoms     pyridOXINE 100 MG tablet  Commonly known as:  VITAMIN B-6  Take 100 mg by mouth at bedtime.     vitamin B-12 1000 MCG tablet  Commonly known as:  CYANOCOBALAMIN  Take 1,000 mcg by mouth every morning.     VITAMIN C PO  Take 1 tablet by mouth daily.     vitamin E 400 UNIT capsule  Take 400 Units by mouth at bedtime.        Diagnostic Studies: Ct Shoulder Right Wo Contrast  05/25/2015   CLINICAL DATA:  Preop shoulder replacement.  EXAM: CT OF THE RIGHT SHOULDER WITHOUT CONTRAST  TECHNIQUE: Multidetector CT imaging was performed according to the standard protocol. Multiplanar CT image reconstructions were also generated.  COMPARISON:  None.  FINDINGS: No acute fracture or dislocation. No lytic or sclerotic osseous lesion. Severe  osteoarthritis of the right glenohumeral joint with a bone-on-bone appearance, subchondral cystic changes and marginal osteophytosis. There is loss of bone stock of the posterior inferior glenoid. There is a small glenohumeral joint effusion.  There is no muscle atrophy. There is no hematoma or fluid collection.  There are mild degenerative changes of the acromioclavicular joint. There is a type I acromion.  There is spiculated right upper lobe airspace opacity likely reflecting fibrosis similar in appearance to the prior exam of 08/02/2012. There is a stable 9 mm right lower lobe pulmonary nodule unchanged compared with 08/02/2012- CT chest.  IMPRESSION: Severe osteoarthritis of the right glenohumeral joint.   Electronically Signed   By: Kathreen Devoid   On: 05/25/2015 15:02   Dg Shoulder Right Port  06/21/2015   CLINICAL DATA:  Status post right shoulder replacement  EXAM: PORTABLE RIGHT SHOULDER - 2+ VIEW  COMPARISON:  None.  FINDINGS: Right shoulder prosthesis is now seen. No acute bony abnormality is noted. Air is noted within the surgical bed.  IMPRESSION: Postsurgical changes.  No acute abnormality noted.   Electronically Signed   By: Inez Catalina M.D.   On: 06/21/2015 13:34    Disposition: 01-Home or Self Care        Follow-up Information    Follow up with Johnny Bridge, MD In 2 weeks.   Specialty:  Orthopedic Surgery   Contact information:   [place] [address] [phone]        Signed: Johnny Bridge 06/22/2015, 2:27 PM

## 2015-06-22 NOTE — Evaluation (Addendum)
Physical Therapy Evaluation Patient Details Name: Christine Leblanc MRN: [id number] DOB: 1929/11/05 Today's Date: 06/22/2015   History of Present Illness  s/p R TSA. PMH includes HTN, ca, seizures, PNA, asthma.  Clinical Impression  Patient presents with pain in right shoulder s/p above surgery impacting functional mobility and balance. Pt requires Min Christine progressing to Min guard assist for ambulation. Pt will have 24/7 S at home from grandson and daughter. Encouraged close min guard during ambulation due to balance deficits from lack of RUE arm swing and use of RUE for support. Pt has necessary assist at home. All education completed. Pt does not require further skilled therapy services. Discharge from therapy.     Follow Up Recommendations Supervision/Assistance - 24 hour;No PT follow up    Equipment Recommendations  None recommended by PT    Recommendations for Other Services OT consult     Precautions / Restrictions Precautions Precautions: Shoulder;Fall Shoulder Interventions: Shoulder sling/immobilizer;Off for dressing/bathing/exercises Precaution Booklet Issued: No Restrictions Weight Bearing Restrictions: Yes RUE Weight Bearing: Non weight bearing      Mobility  Bed Mobility Overal bed mobility: Modified Independent             General bed mobility comments: Sitting in chair upon PT arrival.   Transfers Overall transfer level: Needs assistance Equipment used: None Transfers: Sit to/from Stand Sit to Stand: Min assist         General transfer comment: Min Christine to boost from chair with cues for anterior weight shift and use of momentum. Min guard to rise from toilet using grab bar.  Ambulation/Gait Ambulation/Gait assistance: Min assist;Min guard Ambulation Distance (Feet): 60 Feet Assistive device: 1 person hand held assist;None Gait Pattern/deviations: Step-through pattern;Decreased stride length   Gait velocity interpretation: Below normal speed for  age/gender General Gait Details: Slow, mildly unsteady gait. Min Christine for balance (handheld assist) progressing to min guard with pt using furniture for support (baseline per pt and daughter).  Stairs            Wheelchair Mobility    Modified Rankin (Stroke Patients Only)       Balance Overall balance assessment: Needs assistance Sitting-balance support: Feet supported;No upper extremity supported Sitting balance-Leahy Scale: Good     Standing balance support: During functional activity Standing balance-Leahy Scale: Fair Standing balance comment: Able to perform pericare with Min guard assist for balance/safety.                              Pertinent Vitals/Pain Pain Assessment: 0-10 Pain Score: 4  Pain Location: right shoulder Pain Descriptors / Indicators: Sore Pain Intervention(s): Monitored during session;Repositioned    Home Living Family/patient expects to be discharged to:: Private residence Living Arrangements: Children Available Help at Discharge: Family;Available 24 hours/day Type of Home: House Home Access: Level entry   Entrance Stairs-Number of Steps: 1 Home Layout: Laundry or work area in basement;Able to live on main level with bedroom/bathroom Home Equipment: Environmental consultant - 2 wheels;Cane - single point;Bedside commode;Shower seat;Grab bars - tub/shower      Prior Function Level of Independence: Independent         Comments: Pt normally independent. 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 (housework and cooking mostly)     Engineer, manufacturing [employer]   Dominant Hand: Right    Extremity/Trunk Assessment   Upper Extremity Assessment: Defer to OT evaluation   RUE: Unable to fully assess due to immobilization  Lower Extremity Assessment: Generalized weakness         Communication   Communication: No difficulties  Cognition Arousal/Alertness: Awake/alert Behavior During Therapy: WFL for tasks assessed/performed Overall  Cognitive Status: Within Functional Limits for tasks assessed                      General Comments General comments (skin integrity, edema, etc.): Daughter present during session.    Exercises Donning/doffing shirt without moving shoulder: Caregiver independent with task Method for sponge bathing under operated UE: Caregiver independent with task Donning/doffing sling/immobilizer: Caregiver independent with task Correct positioning of sling/immobilizer: Caregiver independent with task ROM for elbow, wrist and digits of operated UE: Caregiver independent with task;Supervision/safety Sling wearing schedule (on at all times/off for ADL's): Caregiver independent with task Proper positioning of operated UE when showering: Caregiver independent with task Positioning of UE while sleeping: Caregiver independent with task      Assessment/Plan    PT Assessment Patent does not need any further PT services  PT Diagnosis Acute pain   PT Problem List    PT Treatment Interventions     PT Goals (Current goals can be found in the Care Plan section)     Frequency     Barriers to discharge        Co-evaluation               End of Session Equipment Utilized During Treatment: Gait belt;Other (comment) (sling.) Activity Tolerance: Patient tolerated treatment well Patient left: in chair;with call bell/phone within reach;with family/visitor present;with nursing/sitter in room Nurse Communication: Mobility status         Time: 1040-1104 PT Time Calculation (min) (ACUTE ONLY): 24 min   Charges:   PT Evaluation $Initial PT Evaluation Tier I: 1 Procedure PT Treatments $Gait Training: 8-22 mins   PT G Codes:        Christine Leblanc Christine Leblanc 06/22/2015, 1:09 PM  Christine Leblanc, Christine Leblanc, DPT [phone]

## 2015-06-22 NOTE — Evaluation (Signed)
Occupational Therapy Evaluation Patient Details Name: Christine Leblanc MRN: [id number] DOB: Jan 28, 1929 Today's Date: 06/22/2015    History of Present Illness R TSA   Clinical Impression   Pt requires assist with ADLs due to R shoulder in sling and ROM/weight bearing restrictions. Pt is at min guard A level with mobility, able to sit EOB using rails without assist. Pt's daughter present during session. Pt will have family at home with her for sup and assist. Pt has had previous L shoulder and knee surgeriies and is familiar with and has DME and A/E at home. All education completed and no further acute OT indicated at this time. Progress rehab of R shoulder per MD    Follow Up Recommendations  Other (comment) (OT as appropriate per MD for shoulder rehab)    Equipment Recommendations  None recommended by OT    Recommendations for Other Services       Precautions / Restrictions Precautions Precautions: Shoulder Shoulder Interventions: Shoulder sling/immobilizer;Off for dressing/bathing/exercises Restrictions Weight Bearing Restrictions: Yes RUE Weight Bearing: Non weight bearing      Mobility Bed Mobility Overal bed mobility: Modified Independent             General bed mobility comments: used rails on unaffected side, no physical assist  Transfers Overall transfer level: Needs assistance Equipment used: 1 person hand held assist Transfers: Sit to/from Stand Sit to Stand: Min guard              Balance Overall balance assessment: Needs assistance Sitting-balance support: Feet supported Sitting balance-Leahy Scale: Good     Standing balance support: During functional activity Standing balance-Leahy Scale: Fair                              ADL Overall ADL's : Needs assistance/impaired     Grooming: Wash/dry hands;Wash/dry face;Min guard;Standing   Upper Body Bathing: Moderate assistance   Lower Body Bathing: Moderate assistance   Upper  Body Dressing : Moderate assistance   Lower Body Dressing: Moderate assistance Lower Body Dressing Details (indicate cue type and reason): pt and her daughter state that pt will be wearing skirts at home for ease of LB dressing Toilet Transfer: Grab bars;Ambulation;Comfort height toilet;Min guard   Toileting- Clothing Manipulation and Hygiene: Minimal assistance   Tub/ Shower Transfer: 3 in 1;Grab bars;Ambulation;Min guard   Functional mobility during ADLs: Min guard General ADL Comments: pt familar with ADL techniques from previous L TSA and DME and A/E from previous TKA     Vision  wears glasses, no change from baseline   Perception Perception Perception Tested?: No   Praxis Praxis Praxis tested?: Not tested    Pertinent Vitals/Pain Pain Assessment: 0-10 Pain Score: 8  Pain Location: R shoulder Pain Descriptors / Indicators: Throbbing;Aching     Hand Dominance Right   Extremity/Trunk Assessment Upper Extremity Assessment Upper Extremity Assessment: Generalized weakness;RUE deficits/detail RUE: Unable to fully assess due to immobilization   Lower Extremity Assessment Lower Extremity Assessment: Defer to PT evaluation       Communication Communication Communication: No difficulties   Cognition Arousal/Alertness: Awake/alert Behavior During Therapy: WFL for tasks assessed/performed Overall Cognitive Status: Within Functional Limits for tasks assessed                     General Comments   pt pleasant and cooperative    Exercises  R hand/wrist/elow AROM as tolerated     Shoulder  Instructions Shoulder Instructions Donning/doffing shirt without moving shoulder: Caregiver independent with task Method for sponge bathing under operated UE: Caregiver independent with task Donning/doffing sling/immobilizer: Caregiver independent with task Correct positioning of sling/immobilizer: Caregiver independent with task ROM for elbow, wrist and digits of operated  UE: Caregiver independent with task;Supervision/safety Sling wearing schedule (on at all times/off for ADL's): Caregiver independent with task Proper positioning of operated UE when showering: Caregiver independent with task Positioning of UE while sleeping: Caregiver independent with task    Home Living Family/patient expects to be discharged to:: Private residence Living Arrangements: Children Available Help at Discharge: Family Type of Home: House Home Access: Stairs to enter Technical brewer of Steps: 1   Home Layout: Laundry or work area in basement;Able to live on main level with bedroom/bathroom     Bathroom Shower/Tub: Teacher, early years/pre: Standard     Home Equipment: Environmental consultant - 2 wheels;Cane - single point;Bedside commode;Shower seat;Grab bars - tub/shower          Prior Functioning/Environment Level of Independence: Independent        Comments: Pt normally independent. 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 (housework and cooking mostly)    OT Diagnosis: Generalized weakness;Acute pain   OT Problem List: Decreased strength;Decreased range of motion;Pain;Impaired balance (sitting and/or standing);Impaired UE functional use;Decreased activity tolerance   OT Treatment/Interventions:      OT Goals(Current goals can be found in the care plan section) Acute Rehab OT Goals Patient Stated Goal: go home today OT Goal Formulation: With patient/family  OT Frequency:     Barriers to D/C:  none                        End of Session Equipment Utilized During Treatment: Gait belt;Other (comment) (R UE sling)  Activity Tolerance: Patient tolerated treatment well Patient left: in chair;with call bell/phone within reach;with family/visitor present   Time: 1001-1037 OT Time Calculation (min): 36 min Charges:  OT General Charges $OT Visit: 1 Procedure OT Evaluation $Initial OT Evaluation Tier I: 1 Procedure OT Treatments $Self  Care/Home Management : 8-22 mins $Therapeutic Activity: 8-22 mins G-Codes:    Roanne, Haye 06/22/2015, 10:50 AM

## 2015-06-22 NOTE — Progress Notes (Signed)
Orthopedic Tech Progress Note Patient Details:  [name] [birth year] [id number]  Patient ID: [name], female   DOB: [birth year], 81 y.o.   MRN: [id number] Pt unable to use trapeze bar patient helper  Hildred Priest 06/22/2015, 6:19 AM

## 2015-06-22 NOTE — Progress Notes (Signed)
Orthopedic Tech Progress Note Patient Details:  Christine Leblanc 03-09-1929 [id number]  Patient ID: [name], female   DOB: 1929/06/27, 81 y.o.   MRN: [id number] RN stated that pt already has shoulder sling  Hildred Priest 06/22/2015, 10:22 AM

## 2015-06-29 DIAGNOSIS — M19011 Primary osteoarthritis, right shoulder: Secondary | ICD-10-CM | POA: Diagnosis not present

## 2015-06-29 DIAGNOSIS — Z96651 Presence of right artificial knee joint: Secondary | ICD-10-CM | POA: Diagnosis not present

## 2015-07-05 DIAGNOSIS — E1342 Other specified diabetes mellitus with diabetic polyneuropathy: Secondary | ICD-10-CM | POA: Diagnosis not present

## 2015-07-05 DIAGNOSIS — L851 Acquired keratosis [keratoderma] palmaris et plantaris: Secondary | ICD-10-CM | POA: Diagnosis not present

## 2015-07-05 DIAGNOSIS — B351 Tinea unguium: Secondary | ICD-10-CM | POA: Diagnosis not present

## 2015-07-15 DIAGNOSIS — T148 Other injury of unspecified body region: Secondary | ICD-10-CM | POA: Diagnosis not present

## 2015-07-21 DIAGNOSIS — Z79899 Other long term (current) drug therapy: Secondary | ICD-10-CM | POA: Diagnosis not present

## 2015-07-25 DIAGNOSIS — J4530 Mild persistent asthma, uncomplicated: Secondary | ICD-10-CM | POA: Diagnosis not present

## 2015-07-25 DIAGNOSIS — J3089 Other allergic rhinitis: Secondary | ICD-10-CM | POA: Diagnosis not present

## 2015-07-25 DIAGNOSIS — K219 Gastro-esophageal reflux disease without esophagitis: Secondary | ICD-10-CM | POA: Diagnosis not present

## 2015-07-25 DIAGNOSIS — J300 Vasomotor rhinitis: Secondary | ICD-10-CM | POA: Diagnosis not present

## 2015-07-27 DIAGNOSIS — M19011 Primary osteoarthritis, right shoulder: Secondary | ICD-10-CM | POA: Diagnosis not present

## 2015-08-01 ENCOUNTER — Encounter: Payer: Self-pay | Admitting: Cardiology

## 2015-08-01 ENCOUNTER — Ambulatory Visit (INDEPENDENT_AMBULATORY_CARE_PROVIDER_SITE_OTHER): Payer: Medicare Other | Admitting: Cardiology

## 2015-08-01 ENCOUNTER — Telehealth: Payer: Self-pay | Admitting: Cardiology

## 2015-08-01 VITALS — BP 122/74 | HR 90 | Ht 62.0 in | Wt 117.0 lb

## 2015-08-01 DIAGNOSIS — E785 Hyperlipidemia, unspecified: Secondary | ICD-10-CM | POA: Diagnosis not present

## 2015-08-01 DIAGNOSIS — I10 Essential (primary) hypertension: Secondary | ICD-10-CM

## 2015-08-01 NOTE — Patient Instructions (Signed)
Your physician wants you to follow-up in: 1 year with DrBranch You will receive a reminder letter in the mail two months in advance. If you don't receive a letter, please call our office to schedule the follow-up appointment.     Your physician recommends that you continue on your current medications as directed. Please refer to the Current Medication list given to you today.      Thank you for choosing [facility] !        

## 2015-08-01 NOTE — Telephone Encounter (Signed)
Patient son called as he was confused about lab orders that they need to get.  Please call [phone].

## 2015-08-01 NOTE — Progress Notes (Signed)
Patient ID: Christine Leblanc, female   DOB: 01-29-1929, 81 y.o.   MRN: [id number]     Clinical Summary Christine Leblanc is a 81 y.o.female seen today for follow up of the following medical problems.   1. HTN - she is back on norvasc 5mg  daily, compliant.   2. Hyperlipidemia - compliant with pravastatin - she is unsure of her last panel  3. Hx of TIA - on ASA and statin for secondary prevention  4. Hypothyroidism - followed by endocrine Past Medical History  Diagnosis Date  . Syncope and collapse 2007    Possible CVA in 2012 with left lower extremity weakness; refused hospitalization; CT-Atrophy and chronic microvascular ischemic change.   . Asthma   . Gastroesophageal reflux disease     Hiatal hernia; distal esophageal web requiring dilatation; gastric polyps; gastritis; refuses colonoscopy  . Lymphocytic thyroiditis   . Multiple thyroid nodules 2010    Adenomatous; thyroidectomy in 2010  . Hyperlipidemia 04/27/2011  . Anemia   . GERD (gastroesophageal reflux disease)   . Chronic neck pain   . Chronic back pain   . Pneumonia     hosp. for pneumonia- long time ago   . Hypothyroidism   . Seizures     yes- as a child- & into adult years, states she took med. for them at one time, stopped at 32 yrs. of age   . Degenerative joint disease     Left shoulder; cervical spine, knees & hands   . Cancer     skin Ca- ? basal cell   . History of stress test 1990's    stress test done under the care of Dr. Lattie Haw & Dr. Gwenlyn Found, now being followed by Dr. Harl Bowie- in [place] , recently seen & told to f/U in one yr.   . Hypertension   . Osteoarthritis of right knee 07/27/2014  . Primary localized osteoarthrosis of right shoulder 06/21/2015     Allergies  Allergen Reactions  . Celecoxib Shortness Of Breath     Current Outpatient Prescriptions  Medication Sig Dispense Refill  . albuterol (PROVENTIL HFA) 108 (90 BASE) MCG/ACT inhaler Inhale 1-2 puffs into the lungs every 6 (six) hours as  needed for wheezing or shortness of breath. Wheezing, Asthma Symptoms    . amLODipine (NORVASC) 2.5 MG tablet Take 2.5 mg by mouth daily.  0  . amLODipine (NORVASC) 5 MG tablet Take 5 mg by mouth daily.    . Ascorbic Acid (VITAMIN C PO) Take 1 tablet by mouth daily.    Marland Kitchen aspirin 81 MG tablet Take 81 mg by mouth daily.    . baclofen (LIORESAL) 10 MG tablet Take 1 tablet (10 mg total) by mouth 3 (three) times daily. As needed for muscle spasm 50 tablet 0  . calcium-vitamin D (OSCAL WITH D) 500-200 MG-UNIT per tablet Take 1 tablet by mouth 2 (two) times daily.     Marland Kitchen dexlansoprazole (DEXILANT) 60 MG capsule Take 1 capsule (60 mg total) by mouth daily. WITH BREAKFAST 90 capsule 3  . diazepam (VALIUM) 5 MG tablet Take one tablet by mouth at bedtime as needed for sleep (Patient taking differently: Take 2.5-5 mg by mouth at bedtime as needed (sleep). ) 30 tablet 5  . Glucosamine HCl (GLUCOSAMINE PO) Take 1 tablet by mouth 2 (two) times daily.    Marland Kitchen HYDROcodone-acetaminophen (NORCO) 10-325 MG per tablet Take 1-2 tablets by mouth every 6 (six) hours as needed. 75 tablet 0  . levocetirizine (XYZAL) 5 MG  tablet Take 5 mg by mouth every evening.      Marland Kitchen levothyroxine (SYNTHROID, LEVOTHROID) 100 MCG tablet TAKE 1 TABLET DAILY (Patient taking differently: TAKE 1 TABLET DAILY BEFORE BREAKFAST) 90 tablet 2  . montelukast (SINGULAIR) 10 MG tablet Take 10 mg by mouth at bedtime.    . Omega-3 Fatty Acids (FISH OIL) 1200 MG CAPS Take 1 capsule by mouth 2 (two) times daily.     . pravastatin (PRAVACHOL) 80 MG tablet TAKE 1 TABLET DAILY (Patient taking differently: TAKE 1 TABLET DAILY IN THE EVENING) 90 tablet 2  . pyridOXINE (VITAMIN B-6) 100 MG tablet Take 100 mg by mouth at bedtime.    Orlie Dakin Sodium (PERI-COLACE PO) Take 1 tablet by mouth 2 (two) times daily.     . vitamin B-12 (CYANOCOBALAMIN) 1000 MCG tablet Take 1,000 mcg by mouth every morning.    . vitamin E 400 UNIT capsule Take 400 Units by mouth  at bedtime.      No current facility-administered medications for this visit.     Past Surgical History  Procedure Laterality Date  . Abdominal hysterectomy      fibroids  . Prolapsed uterine fibroid ligation      outcomed with rectocele & cystocele  . Total thyroidectomy  2010  . Esophagogastroduodenoscopy  12/10    Dr. 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 Cataract extraction      Bilateral; redo surgery on the right for incomplete primary procedure  . Colonoscopy  Remote  . Shoulder surgery      left  . Total knee arthroplasty Right 07/27/2014    Procedure: RIGHT TOTAL KNEE ARTHROPLASTY;  Surgeon: Johnny Bridge, MD;  Location: [facility];  Service: Orthopedics;  Laterality: Right;  . Esophagogastroduodenoscopy N/A 03/21/2015    Procedure: ESOPHAGOGASTRODUODENOSCOPY (EGD);  Surgeon: Danie Binder, MD;  [facility];  Service: Endoscopy;  Laterality: N/A;  1130   . Savory dilation N/A 03/21/2015    Procedure: SAVORY DILATION;  Surgeon: Danie Binder, MD;  [facility];  Service: Endoscopy;  Laterality: N/A;  . Eye surgery    . Total shoulder arthroplasty Right 06/21/2015    Procedure: RIGHT TOTAL SHOULDER ARTHROPLASTY;  Surgeon: Marchia Bond, MD;  Location: [facility];  Service: Orthopedics;  Laterality: Right;     Allergies  Allergen Reactions  . Celecoxib Shortness Of Breath      Family History  Problem Relation Age of Onset  . Heart disease Mother   . Gallbladder disease Mother   . Breast cancer Sister   . Colon cancer Neg Hx      Social History Christine Leblanc reports that she quit smoking about 36 years ago. Her smoking use included Cigarettes. She has a 16 pack-year smoking history. She has never used smokeless tobacco. Christine Leblanc reports that she does not drink alcohol.   Review of Systems CONSTITUTIONAL: No weight loss, fever, chills, weakness or fatigue.  HEENT: Eyes: No visual loss,  blurred vision, double vision or yellow sclerae.No hearing loss, sneezing, congestion, runny nose or sore throat.  SKIN: No rash or itching.  CARDIOVASCULAR: per HPI RESPIRATORY: No shortness of breath, cough or sputum.  GASTROINTESTINAL: No anorexia, nausea, vomiting or diarrhea. No abdominal pain or blood.  GENITOURINARY: No burning on urination, no polyuria NEUROLOGICAL: No headache, dizziness, syncope, paralysis, ataxia, numbness or tingling in the extremities. No change in bowel or bladder control.  MUSCULOSKELETAL: No muscle, back pain, joint  pain or stiffness.  LYMPHATICS: No enlarged nodes. No history of splenectomy.  PSYCHIATRIC: No history of depression or anxiety.  ENDOCRINOLOGIC: No reports of sweating, cold or heat intolerance. No polyuria or polydipsia.  Marland Kitchen   Physical Examination Filed Vitals:   08/01/15 1303  BP: 122/74  Pulse: 90   Filed Vitals:   08/01/15 1303  Height: 5\' 2"  (1.575 m)  Weight: 117 lb (53.071 kg)    Gen: resting comfortably, no acute distress HEENT: no scleral icterus, pupils equal round and reactive, no palptable cervical adenopathy,  CV: RRR, no m/r/g, no JVD Resp: Clear to auscultation bilaterally GI: abdomen is soft, non-tender, non-distended, normal bowel sounds, no hepatosplenomegaly MSK: extremities are warm, no edema.  Skin: warm, no rash Neuro:  no focal deficits Psych: appropriate affect     Assessment and Plan   1. HTN - no long requiring medical therapy - home pressures at goal, continue to follow  2. Hyperlipideima - repeat lipid panel  3. Hx of TIA - continue ASA and statin for secondary prevention      Arnoldo Lenis, M.D.

## 2015-08-03 DIAGNOSIS — M25611 Stiffness of right shoulder, not elsewhere classified: Secondary | ICD-10-CM | POA: Diagnosis not present

## 2015-08-03 DIAGNOSIS — M25511 Pain in right shoulder: Secondary | ICD-10-CM | POA: Diagnosis not present

## 2015-08-03 DIAGNOSIS — R531 Weakness: Secondary | ICD-10-CM | POA: Diagnosis not present

## 2015-08-08 DIAGNOSIS — J45909 Unspecified asthma, uncomplicated: Secondary | ICD-10-CM | POA: Diagnosis not present

## 2015-08-11 DIAGNOSIS — M25611 Stiffness of right shoulder, not elsewhere classified: Secondary | ICD-10-CM | POA: Diagnosis not present

## 2015-08-11 DIAGNOSIS — M25511 Pain in right shoulder: Secondary | ICD-10-CM | POA: Diagnosis not present

## 2015-08-11 DIAGNOSIS — R531 Weakness: Secondary | ICD-10-CM | POA: Diagnosis not present

## 2015-08-18 ENCOUNTER — Ambulatory Visit (INDEPENDENT_AMBULATORY_CARE_PROVIDER_SITE_OTHER): Payer: Medicare Other | Admitting: Otolaryngology

## 2015-08-22 DIAGNOSIS — M25511 Pain in right shoulder: Secondary | ICD-10-CM | POA: Diagnosis not present

## 2015-08-23 DIAGNOSIS — J45909 Unspecified asthma, uncomplicated: Secondary | ICD-10-CM | POA: Diagnosis not present

## 2015-09-02 DIAGNOSIS — Z79899 Other long term (current) drug therapy: Secondary | ICD-10-CM | POA: Diagnosis not present

## 2015-09-13 DIAGNOSIS — E1342 Other specified diabetes mellitus with diabetic polyneuropathy: Secondary | ICD-10-CM | POA: Diagnosis not present

## 2015-09-13 DIAGNOSIS — B351 Tinea unguium: Secondary | ICD-10-CM | POA: Diagnosis not present

## 2015-09-13 DIAGNOSIS — L851 Acquired keratosis [keratoderma] palmaris et plantaris: Secondary | ICD-10-CM | POA: Diagnosis not present

## 2015-09-15 DIAGNOSIS — M25511 Pain in right shoulder: Secondary | ICD-10-CM | POA: Diagnosis not present

## 2015-09-15 DIAGNOSIS — M19011 Primary osteoarthritis, right shoulder: Secondary | ICD-10-CM | POA: Diagnosis not present

## 2015-09-21 DIAGNOSIS — J45909 Unspecified asthma, uncomplicated: Secondary | ICD-10-CM | POA: Diagnosis not present

## 2015-09-29 DIAGNOSIS — J3089 Other allergic rhinitis: Secondary | ICD-10-CM | POA: Diagnosis not present

## 2015-09-29 DIAGNOSIS — J301 Allergic rhinitis due to pollen: Secondary | ICD-10-CM | POA: Diagnosis not present

## 2015-10-05 DIAGNOSIS — Z1389 Encounter for screening for other disorder: Secondary | ICD-10-CM | POA: Diagnosis not present

## 2015-10-05 DIAGNOSIS — J302 Other seasonal allergic rhinitis: Secondary | ICD-10-CM | POA: Diagnosis not present

## 2015-10-05 DIAGNOSIS — I10 Essential (primary) hypertension: Secondary | ICD-10-CM | POA: Diagnosis not present

## 2015-10-05 DIAGNOSIS — Z6821 Body mass index (BMI) 21.0-21.9, adult: Secondary | ICD-10-CM | POA: Diagnosis not present

## 2015-10-06 DIAGNOSIS — J45909 Unspecified asthma, uncomplicated: Secondary | ICD-10-CM | POA: Diagnosis not present

## 2015-10-10 DIAGNOSIS — I10 Essential (primary) hypertension: Secondary | ICD-10-CM | POA: Diagnosis not present

## 2015-10-10 DIAGNOSIS — E039 Hypothyroidism, unspecified: Secondary | ICD-10-CM | POA: Diagnosis not present

## 2015-10-10 DIAGNOSIS — R7301 Impaired fasting glucose: Secondary | ICD-10-CM | POA: Diagnosis not present

## 2015-10-10 DIAGNOSIS — Z6821 Body mass index (BMI) 21.0-21.9, adult: Secondary | ICD-10-CM | POA: Diagnosis not present

## 2015-10-10 DIAGNOSIS — R739 Hyperglycemia, unspecified: Secondary | ICD-10-CM | POA: Diagnosis not present

## 2015-10-10 DIAGNOSIS — Z1389 Encounter for screening for other disorder: Secondary | ICD-10-CM | POA: Diagnosis not present

## 2015-10-10 DIAGNOSIS — E782 Mixed hyperlipidemia: Secondary | ICD-10-CM | POA: Diagnosis not present

## 2015-10-13 DIAGNOSIS — M19041 Primary osteoarthritis, right hand: Secondary | ICD-10-CM | POA: Diagnosis not present

## 2015-10-13 DIAGNOSIS — M67441 Ganglion, right hand: Secondary | ICD-10-CM | POA: Diagnosis not present

## 2015-10-19 ENCOUNTER — Ambulatory Visit: Payer: Medicare Other | Admitting: Gastroenterology

## 2015-10-19 DIAGNOSIS — Z1211 Encounter for screening for malignant neoplasm of colon: Secondary | ICD-10-CM | POA: Diagnosis not present

## 2015-10-19 DIAGNOSIS — J300 Vasomotor rhinitis: Secondary | ICD-10-CM | POA: Diagnosis not present

## 2015-11-03 ENCOUNTER — Ambulatory Visit (INDEPENDENT_AMBULATORY_CARE_PROVIDER_SITE_OTHER): Payer: Medicare Other | Admitting: Otolaryngology

## 2015-11-03 DIAGNOSIS — H6123 Impacted cerumen, bilateral: Secondary | ICD-10-CM | POA: Diagnosis not present

## 2015-11-03 DIAGNOSIS — H903 Sensorineural hearing loss, bilateral: Secondary | ICD-10-CM | POA: Diagnosis not present

## 2015-11-11 DIAGNOSIS — J309 Allergic rhinitis, unspecified: Secondary | ICD-10-CM | POA: Diagnosis not present

## 2015-11-14 ENCOUNTER — Other Ambulatory Visit: Payer: Self-pay

## 2015-11-14 DIAGNOSIS — J45909 Unspecified asthma, uncomplicated: Secondary | ICD-10-CM | POA: Diagnosis not present

## 2015-11-14 MED ORDER — DEXLANSOPRAZOLE 60 MG PO CPDR: 60.0000 mg | DELAYED_RELEASE_CAPSULE | Freq: Every day | ORAL | Status: DC

## 2015-11-15 DIAGNOSIS — M19011 Primary osteoarthritis, right shoulder: Secondary | ICD-10-CM | POA: Diagnosis not present

## 2015-11-17 ENCOUNTER — Encounter: Payer: Self-pay | Admitting: Gastroenterology

## 2015-11-17 ENCOUNTER — Ambulatory Visit (INDEPENDENT_AMBULATORY_CARE_PROVIDER_SITE_OTHER): Payer: Medicare Other | Admitting: Gastroenterology

## 2015-11-17 VITALS — BP 125/69 | HR 75 | Temp 97.8°F | Ht 62.0 in | Wt 120.6 lb

## 2015-11-17 DIAGNOSIS — K219 Gastro-esophageal reflux disease without esophagitis: Secondary | ICD-10-CM

## 2015-11-17 DIAGNOSIS — D649 Anemia, unspecified: Secondary | ICD-10-CM | POA: Diagnosis not present

## 2015-11-17 DIAGNOSIS — R1310 Dysphagia, unspecified: Secondary | ICD-10-CM

## 2015-11-17 NOTE — Assessment & Plan Note (Signed)
SYMPTOMS FAIRLY WELL CONTROLLED.  CONTINUE TO MONITOR SYMPTOMS. 

## 2015-11-17 NOTE — Progress Notes (Signed)
Subjective:    Patient ID: Christine Leblanc, female    DOB: [birth year], 81 y.o.   MRN: [id number] [name], MD  HPI WONDERED ABOUT HER RX FOR DEXILANT. DOING AS WELL AS USUAL. CAN'T CHEW WELL. HAS TO CHEW SLOWLY AND DENTIST SAID IT WAS BECAUSE SHE DOESN'T HAVE BOTTOM LEFT TEETH. HAS UPPER PLATES AND CROWNS ON THE RIGHT LOWER. ALLERGIES ARE BOTHERING HER. CAN'T SWALLOW BIG PILLS. OCCASIONAL TROUBLE SWALLOWING  SOLID FOOD. CUTS FOOD VERY SMALL. RARE DULL ABDOMINAL PAIN: RUQ AND ACROSS UPPER ABDOMEN. BmS: once a day with PERI-COLACE.  PT DENIES FEVER, CHILLS, HEMATOCHEZIA, HEMATEMESIS, nausea, vomiting, melena, diarrhea, CHEST PAIN, SHORTNESS OF BREATH,  CHANGE IN BOWEL IN HABITS, OR heartburn or indigestion.   Past Medical History  Diagnosis Date  . Syncope and collapse 2007    Possible CVA in 2012 with left lower extremity weakness; refused hospitalization; CT-Atrophy and chronic microvascular ischemic change.   . Asthma   . Gastroesophageal reflux disease     Hiatal hernia; distal esophageal web requiring dilatation; gastric polyps; gastritis; refuses colonoscopy  . Lymphocytic thyroiditis   . Multiple thyroid nodules 2010    Adenomatous; thyroidectomy in 2010  . Hyperlipidemia 04/27/2011  . Anemia   . GERD (gastroesophageal reflux disease)   . Chronic neck pain   . Chronic back pain   . Pneumonia     hosp. for pneumonia- long time ago   . Hypothyroidism   . Seizures     yes- as a child- & into adult years, states she took med. for them at one time, stopped at 32 yrs. of age   . Degenerative joint disease     Left shoulder; cervical spine, knees & hands   . Cancer     skin Ca- ? basal cell   . History of stress test 1990's    stress test done under the care of Dr. Lattie Haw & Dr. Gwenlyn Found, now being followed by Dr. Harl Bowie- in [place] , recently seen & told to f/U in one yr.   . Hypertension   . Osteoarthritis of right knee 07/27/2014  . Primary localized  osteoarthrosis of right shoulder 06/21/2015   Past Surgical History  Procedure Laterality Date  . Abdominal hysterectomy      fibroids  . Prolapsed uterine fibroid ligation      outcomed with rectocele & cystocele  . Total thyroidectomy  2010  . Esophagogastroduodenoscopy  12/10    Dr. 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 Cataract extraction      Bilateral; redo surgery on the right for incomplete primary procedure  . Colonoscopy  Remote  . Shoulder surgery      left  . Total knee arthroplasty Right 07/27/2014    Procedure: RIGHT TOTAL KNEE ARTHROPLASTY;  Surgeon: Johnny Bridge, MD;  Location: [facility];  Service: Orthopedics;  Laterality: Right;  . Esophagogastroduodenoscopy N/A 03/21/2015    Procedure: ESOPHAGOGASTRODUODENOSCOPY (EGD);  Surgeon: Danie Binder, MD;  [facility];  Service: Endoscopy;  Laterality: N/A;  1130   . Savory dilation N/A 03/21/2015    Procedure: SAVORY DILATION;  Surgeon: Danie Binder, MD;  [facility];  Service: Endoscopy;  Laterality: N/A;  . Eye surgery    . Total shoulder arthroplasty Right 06/21/2015    Procedure: RIGHT TOTAL SHOULDER ARTHROPLASTY;  Surgeon: Marchia Bond, MD;  Location: [facility];  Service: Orthopedics;  Laterality: Right;   Allergies  Allergen Reactions  .  Celecoxib Shortness Of Breath   Current Outpatient Prescriptions  Medication Sig Dispense Refill  . albuterol (PROVENTIL HFA) 108 (90 BASE) MCG/ACT inhaler Inhale 1-2 puffs into the lungs every 6 (six) hours as needed for wheezing or shortness of breath.     Marland Kitchen amLODipine (NORVASC) 5 MG tablet Take 5 mg by mouth daily.    . Ascorbic Acid (VITAMIN C PO) Take 1 tablet by mouth daily.    Marland Kitchen aspirin 81 MG tablet Take 81 mg by mouth daily.    . calcium-vitamin D (OSCAL WITH D) 500-200 MG-UNIT per tablet Take 1 tablet by mouth 2 (two) times daily.     Marland Kitchen dexlansoprazole (DEXILANT) 60 MG capsule Take 1 capsule (60  mg total) by mouth daily. WITH BREAKFAST 90 capsule 3  . diazepam (VALIUM) 5 MG tablet Take one tablet by mouth at bedtime as needed for sleep (Patient taking differently: Take 2.5-5 mg by mouth at bedtime as needed (sleep). ) 30 tablet 5  . Glucosamine HCl (GLUCOSAMINE PO) Take 1 tablet by mouth 2 (two) times daily.    Marland Kitchen levocetirizine (XYZAL) 5 MG tablet Take 5 mg by mouth every evening.      Marland Kitchen levothyroxine (SYNTHROID, LEVOTHROID) 100 MCG tablet TAKE 1 TABLET DAILY (Patient taking differently: TAKE 1 TABLET DAILY BEFORE BREAKFAST) 90 tablet 2  . montelukast (SINGULAIR) 10 MG tablet Take 10 mg by mouth at bedtime.    . Omega-3 Fatty Acids (FISH OIL) 1200 MG CAPS Take 1 capsule by mouth 2 (two) times daily.     . pravastatin (PRAVACHOL) 80 MG tablet TAKE 1 TABLET DAILY (Patient taking differently: TAKE 1 TABLET DAILY IN THE EVENING) 90 tablet 2  . pyridOXINE (VITAMIN B-6) 100 MG tablet Take 100 mg by mouth at bedtime.    Orlie Dakin Sodium (PERI-COLACE PO) Take 1 tablet by mouth 2 (two) times daily.     . vitamin B-12 (CYANOCOBALAMIN) 1000 MCG tablet Take 1,000 mcg by mouth every morning.    . vitamin E 400 UNIT capsule Take 400 Units by mouth at bedtime.     .      .       Review of Systems PER HPI OTHERWISE ALL SYSTEMS ARE NEGATIVE.    Objective:   Physical Exam  Constitutional: She is oriented to person, place, and time. She appears well-developed and well-nourished. No distress.  HENT:  Head: Normocephalic and atraumatic.  Mouth/Throat: Oropharynx is clear and moist. No oropharyngeal exudate.  Eyes: Pupils are equal, round, and reactive to light. No scleral icterus.  Neck: Normal range of motion. Neck supple.  Cardiovascular: Normal rate, regular rhythm and normal heart sounds.   Pulmonary/Chest: Effort normal and breath sounds normal. No respiratory distress.  Abdominal: Soft. Bowel sounds are normal. She exhibits no distension. There is no tenderness.  Musculoskeletal:  She exhibits no edema.  Lymphadenopathy:    She has no cervical adenopathy.  Neurological: She is alert and oriented to person, place, and time.  Psychiatric: She has a normal mood and affect.  Vitals reviewed.     Assessment & Plan:

## 2015-11-17 NOTE — Assessment & Plan Note (Signed)
SYMPTOMS CONTROLLED/RESOLVED.  REFILL DEXILANT x 1 YEAR CONTINUE TO MONITOR SYMPTOMS. FOLLOW UP IN 6 MOS.

## 2015-11-17 NOTE — Patient Instructions (Signed)
[place]  COMPLETE LAB WORK.  FOLLOW UP IN 6 MOS. MERRY CHRISTMAS AND HAPPY NEW YEAR!

## 2015-11-17 NOTE — Assessment & Plan Note (Signed)
NO WARNING SIGNS/SYMPTOMS-NO BRBPR OR MELENA.  CBC WITH NEXT BLOOD DRAW FOLLOW UP IN 6 MOS.

## 2015-11-18 DIAGNOSIS — J329 Chronic sinusitis, unspecified: Secondary | ICD-10-CM | POA: Diagnosis not present

## 2015-11-18 DIAGNOSIS — Z6821 Body mass index (BMI) 21.0-21.9, adult: Secondary | ICD-10-CM | POA: Diagnosis not present

## 2015-11-18 DIAGNOSIS — J302 Other seasonal allergic rhinitis: Secondary | ICD-10-CM | POA: Diagnosis not present

## 2015-11-18 DIAGNOSIS — M1990 Unspecified osteoarthritis, unspecified site: Secondary | ICD-10-CM | POA: Diagnosis not present

## 2015-11-21 ENCOUNTER — Ambulatory Visit: Payer: Medicare Other | Admitting: Endocrinology

## 2015-11-21 NOTE — Progress Notes (Signed)
CC'D TO PCP °

## 2015-11-21 NOTE — Progress Notes (Signed)
ON RECALL  °

## 2015-11-22 DIAGNOSIS — E1342 Other specified diabetes mellitus with diabetic polyneuropathy: Secondary | ICD-10-CM | POA: Diagnosis not present

## 2015-11-22 DIAGNOSIS — L851 Acquired keratosis [keratoderma] palmaris et plantaris: Secondary | ICD-10-CM | POA: Diagnosis not present

## 2015-11-22 DIAGNOSIS — B351 Tinea unguium: Secondary | ICD-10-CM | POA: Diagnosis not present

## 2015-11-30 DIAGNOSIS — J45909 Unspecified asthma, uncomplicated: Secondary | ICD-10-CM | POA: Diagnosis not present

## 2015-12-02 ENCOUNTER — Other Ambulatory Visit: Payer: Self-pay | Admitting: Endocrinology

## 2015-12-06 DIAGNOSIS — Z961 Presence of intraocular lens: Secondary | ICD-10-CM | POA: Diagnosis not present

## 2015-12-06 DIAGNOSIS — H1859 Other hereditary corneal dystrophies: Secondary | ICD-10-CM | POA: Diagnosis not present

## 2015-12-06 DIAGNOSIS — H40011 Open angle with borderline findings, low risk, right eye: Secondary | ICD-10-CM | POA: Diagnosis not present

## 2015-12-06 DIAGNOSIS — H40012 Open angle with borderline findings, low risk, left eye: Secondary | ICD-10-CM | POA: Diagnosis not present

## 2015-12-09 DIAGNOSIS — Z803 Family history of malignant neoplasm of breast: Secondary | ICD-10-CM | POA: Diagnosis not present

## 2015-12-09 DIAGNOSIS — Z1231 Encounter for screening mammogram for malignant neoplasm of breast: Secondary | ICD-10-CM | POA: Diagnosis not present

## 2015-12-15 DIAGNOSIS — J45909 Unspecified asthma, uncomplicated: Secondary | ICD-10-CM | POA: Diagnosis not present

## 2016-01-05 DIAGNOSIS — J45909 Unspecified asthma, uncomplicated: Secondary | ICD-10-CM | POA: Diagnosis not present

## 2016-01-13 DIAGNOSIS — F419 Anxiety disorder, unspecified: Secondary | ICD-10-CM | POA: Diagnosis not present

## 2016-01-13 DIAGNOSIS — Z681 Body mass index (BMI) 19 or less, adult: Secondary | ICD-10-CM | POA: Diagnosis not present

## 2016-01-13 DIAGNOSIS — I10 Essential (primary) hypertension: Secondary | ICD-10-CM | POA: Diagnosis not present

## 2016-01-13 DIAGNOSIS — J45909 Unspecified asthma, uncomplicated: Secondary | ICD-10-CM | POA: Diagnosis not present

## 2016-01-13 DIAGNOSIS — E063 Autoimmune thyroiditis: Secondary | ICD-10-CM | POA: Diagnosis not present

## 2016-01-24 DIAGNOSIS — J45909 Unspecified asthma, uncomplicated: Secondary | ICD-10-CM | POA: Diagnosis not present

## 2016-02-06 ENCOUNTER — Ambulatory Visit: Payer: Medicare Other | Admitting: Endocrinology

## 2016-02-06 ENCOUNTER — Telehealth: Payer: Self-pay | Admitting: Endocrinology

## 2016-02-06 NOTE — Telephone Encounter (Signed)
Form is on your desk for signature.

## 2016-02-06 NOTE — Telephone Encounter (Signed)
Pt needs lab orders sent to F# [phone]. Pt plans to go as soon as we can get it ordered so please call once done

## 2016-02-07 DIAGNOSIS — J301 Allergic rhinitis due to pollen: Secondary | ICD-10-CM | POA: Diagnosis not present

## 2016-02-08 ENCOUNTER — Other Ambulatory Visit: Payer: Self-pay | Admitting: Endocrinology

## 2016-02-08 DIAGNOSIS — E039 Hypothyroidism, unspecified: Secondary | ICD-10-CM | POA: Diagnosis not present

## 2016-02-08 DIAGNOSIS — F419 Anxiety disorder, unspecified: Secondary | ICD-10-CM | POA: Diagnosis not present

## 2016-02-08 DIAGNOSIS — J329 Chronic sinusitis, unspecified: Secondary | ICD-10-CM | POA: Diagnosis not present

## 2016-02-08 DIAGNOSIS — Z6822 Body mass index (BMI) 22.0-22.9, adult: Secondary | ICD-10-CM | POA: Diagnosis not present

## 2016-02-08 DIAGNOSIS — Z1389 Encounter for screening for other disorder: Secondary | ICD-10-CM | POA: Diagnosis not present

## 2016-02-09 LAB — TSH: TSH: 4.47 u[IU]/mL (ref 0.450–4.500)

## 2016-02-09 LAB — T4, FREE: Free T4: 1.55 ng/dL (ref 0.82–1.77)

## 2016-02-13 ENCOUNTER — Ambulatory Visit (INDEPENDENT_AMBULATORY_CARE_PROVIDER_SITE_OTHER): Payer: Medicare Other | Admitting: Endocrinology

## 2016-02-13 ENCOUNTER — Encounter: Payer: Self-pay | Admitting: Endocrinology

## 2016-02-13 VITALS — BP 110/64 | HR 71 | Temp 97.7°F | Resp 14 | Ht 62.0 in | Wt 123.0 lb

## 2016-02-13 DIAGNOSIS — E890 Postprocedural hypothyroidism: Secondary | ICD-10-CM | POA: Diagnosis not present

## 2016-02-13 NOTE — Patient Instructions (Signed)
Same dose 

## 2016-02-13 NOTE — Progress Notes (Signed)
Christine Leblanc 82 y.o.    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   She has been on 100 mcg since 03/2014 She was last seen in 2/16  She does not complain of any unusual fatigue, cold sensitivity or overall weight change Is not on any new significant medications or supplements          Compliance with the medical regimen has been as prescribed with taking the tablet in the morning before breakfast.    Lab Results  Component Value Date   TSH 4.470 02/08/2016   TSH 2.912 01/28/2015   TSH 2.546 07/21/2014        Medication List       This list is accurate as of: 02/13/16  9:09 PM.  Always use your most recent med list.               amLODipine 2.5 MG tablet  Commonly known as:  NORVASC  Take 2.5 mg by mouth daily.     aspirin 81 MG tablet  Take 81 mg by mouth daily.     baclofen 10 MG tablet  Commonly known as:  LIORESAL  Take 1 tablet (10 mg total) by mouth 3 (three) times daily. As needed for muscle spasm     calcium-vitamin D 500-200 MG-UNIT tablet  Commonly known as:  OSCAL WITH D  Take 1 tablet by mouth 2 (two) times daily.     dexlansoprazole 60 MG capsule  Commonly known as:  DEXILANT  Take 1 capsule (60 mg total) by mouth daily. WITH BREAKFAST     diazepam 5 MG tablet  Commonly known as:  VALIUM  Take one tablet by mouth at bedtime as needed for sleep     Fish Oil 1200 MG Caps  Take 1 capsule by mouth daily.     FLUZONE HIGH-DOSE 0.5 ML Susy  Generic drug:  Influenza Vac Split High-Dose  inject 0.5 milliliter intramuscularly      GLUCOSAMINE PO  Take 1 tablet by mouth 2 (two) times daily.     HYDROcodone-acetaminophen 10-325 MG tablet  Commonly known as:  NORCO  Take 1-2 tablets by mouth every 6 (six) hours as needed.     levocetirizine 5 MG tablet  Commonly known as:  XYZAL  Take 5 mg by mouth every evening.     levothyroxine 100 MCG tablet  Commonly known as:  SYNTHROID, LEVOTHROID  TAKE 1 TABLET DAILY     montelukast 10 MG tablet  Commonly known as:  SINGULAIR  Take 10 mg by mouth at bedtime.     PERI-COLACE PO  Take 1 tablet by mouth 2 (two) times daily.     pravastatin 80 MG tablet  Commonly known as:  PRAVACHOL  TAKE 1 TABLET DAILY     PROVENTIL HFA 108 (90 Base) MCG/ACT inhaler  Generic drug:  albuterol  Inhale 1-2 puffs into the lungs every 6 (six) hours as needed for wheezing or shortness of breath. Wheezing, Asthma Symptoms     pyridOXINE 100 MG tablet  Commonly known as:  VITAMIN B-6  Take  100 mg by mouth at bedtime.     vitamin B-12 1000 MCG tablet  Commonly known as:  CYANOCOBALAMIN  Take 1,000 mcg by mouth every morning.     VITAMIN C PO  Take 1 tablet by mouth daily.     vitamin E 400 UNIT capsule  Take 400 Units by mouth at bedtime.        Allergies:  Allergies  Allergen Reactions  . Celecoxib Shortness Of Breath    Past Medical History  Diagnosis Date  . Syncope and collapse 2007    Possible CVA in 2012 with left lower extremity weakness; refused hospitalization; CT-Atrophy and chronic microvascular ischemic change.   . Asthma   . Gastroesophageal reflux disease     Hiatal hernia; distal esophageal web requiring dilatation; gastric polyps; gastritis; refuses colonoscopy  . Lymphocytic thyroiditis   . Multiple thyroid nodules 2010    Adenomatous; thyroidectomy in 2010  . Hyperlipidemia 04/27/2011  . Anemia   . GERD (gastroesophageal reflux disease)   . Chronic neck pain   . Chronic back pain   . Pneumonia     hosp. for pneumonia- long time ago   .  Hypothyroidism   . Seizures ([place])     yes- as a child- & into adult years, states she took med. for them at one time, stopped at 32 yrs. of age   . Degenerative joint disease     Left shoulder; cervical spine, knees & hands   . Cancer ([place])     skin Ca- ? basal cell   . History of stress test 1990's    stress test done under the care of Dr. Lattie Haw & Dr. Gwenlyn Found, now being followed by Dr. Harl Bowie- in [place] , recently seen & told to f/U in one yr.   . Hypertension   . Osteoarthritis of right knee 07/27/2014  . Primary localized osteoarthrosis of right shoulder 06/21/2015    Past Surgical History  Procedure Laterality Date  . Abdominal hysterectomy      fibroids  . Prolapsed uterine fibroid ligation      outcomed with rectocele & cystocele  . Total thyroidectomy  2010  . Esophagogastroduodenoscopy  12/10    Dr. 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 Cataract extraction      Bilateral; redo surgery on the right for incomplete primary procedure  . Colonoscopy  Remote  . Shoulder surgery      left  . Total knee arthroplasty Right 07/27/2014    Procedure: RIGHT TOTAL KNEE ARTHROPLASTY;  Surgeon: Johnny Bridge, MD;  Location: [facility];  Service: Orthopedics;  Laterality: Right;  . Esophagogastroduodenoscopy N/A 03/21/2015    Procedure: ESOPHAGOGASTRODUODENOSCOPY (EGD);  Surgeon: Danie Binder, MD;  [facility];  Service: Endoscopy;  Laterality: N/A;  1130   . Savory dilation N/A 03/21/2015    Procedure: SAVORY DILATION;  Surgeon: Danie Binder, MD;  [facility];  Service: Endoscopy;  Laterality: N/A;  . Eye surgery    . Total shoulder arthroplasty Right 06/21/2015    Procedure: RIGHT TOTAL SHOULDER ARTHROPLASTY;  Surgeon: Marchia Bond, MD;  Location: [facility];  Service: Orthopedics;  Laterality: Right;    Family History  Problem Relation Age of Onset  . Heart disease Mother   . Gallbladder disease Mother    . Breast cancer Sister   . Colon cancer Neg Hx     Social History:  reports that she quit smoking about  37 years ago. Her smoking use included Cigarettes. She has a 16 pack-year smoking history. She has never used smokeless tobacco. She reports that she does not drink alcohol or use illicit drugs.  REVIEW Of SYSTEMS:  Review of Systems  Musculoskeletal: Positive for joint pain.     Weight:   Wt Readings from Last 3 Encounters:  02/13/16 123 lb (55.792 kg)  11/17/15 120 lb 9.6 oz (54.704 kg)  08/01/15 117 lb (53.071 kg)    History of hypertension followed by PCP   Examination:   BP 110/64 mmHg  Pulse 71  Temp(Src) 97.7 F (36.5 C)  Resp 14  Ht 5\' 2"  (1.575 m)  Wt 123 lb (55.792 kg)  BMI 22.49 kg/m2  SpO2 97%     Thyroid not palpable Biceps reflexes normal, no tremor    Assessments   1. Hypothyroidism, postsurgical with normal TSH now using the generic Synthroid dose of 100 mcg Subjectively doing well She looks euthyroid Although TSH is high normal this is probably appropriate for age She will continue the same dose and followup in one year unless she has any unusual fatigue   Anisia Leija 02/13/2016, 9:09 PM

## 2016-02-14 DIAGNOSIS — J4530 Mild persistent asthma, uncomplicated: Secondary | ICD-10-CM | POA: Diagnosis not present

## 2016-02-14 DIAGNOSIS — J3089 Other allergic rhinitis: Secondary | ICD-10-CM | POA: Diagnosis not present

## 2016-02-14 DIAGNOSIS — K219 Gastro-esophageal reflux disease without esophagitis: Secondary | ICD-10-CM | POA: Diagnosis not present

## 2016-02-22 DIAGNOSIS — J45909 Unspecified asthma, uncomplicated: Secondary | ICD-10-CM | POA: Diagnosis not present

## 2016-02-28 ENCOUNTER — Other Ambulatory Visit: Payer: Self-pay | Admitting: Endocrinology

## 2016-03-07 DIAGNOSIS — J45909 Unspecified asthma, uncomplicated: Secondary | ICD-10-CM | POA: Diagnosis not present

## 2016-03-15 ENCOUNTER — Other Ambulatory Visit: Payer: Self-pay | Admitting: Cardiology

## 2016-03-15 MED ORDER — PRAVASTATIN SODIUM 80 MG PO TABS: 80.0000 mg | ORAL_TABLET | Freq: Every evening | ORAL | Status: DC

## 2016-03-15 NOTE — Telephone Encounter (Signed)
Sent in rx for pravastatin to [facility]

## 2016-03-15 NOTE — Telephone Encounter (Signed)
°*  STAT* If patient is at the pharmacy, call can be transferred to refill team.   1. Which medications need to be refilled? (please list name of each medication and dose if known) Pravastatin 80mg   2. Which pharmacy/location (including street and city if local pharmacy) is medication to be sent to? [facility]  3. Do they need a 30 day or 90 day supply? 90 day supply  Patient states that she feels she has enough medicine left for 2 weeks. [facility] said she has no refills left on the prescription for them to fill the med.   Patient is scheduled to have a 1 year follow-up with Dr. Harl Bowie in August 2017 so she will need enough refills to get her through to that appointment.

## 2016-03-20 DIAGNOSIS — B351 Tinea unguium: Secondary | ICD-10-CM | POA: Diagnosis not present

## 2016-03-20 DIAGNOSIS — E1342 Other specified diabetes mellitus with diabetic polyneuropathy: Secondary | ICD-10-CM | POA: Diagnosis not present

## 2016-03-20 DIAGNOSIS — L851 Acquired keratosis [keratoderma] palmaris et plantaris: Secondary | ICD-10-CM | POA: Diagnosis not present

## 2016-03-21 DIAGNOSIS — Z79899 Other long term (current) drug therapy: Secondary | ICD-10-CM | POA: Diagnosis not present

## 2016-03-21 DIAGNOSIS — E1142 Type 2 diabetes mellitus with diabetic polyneuropathy: Secondary | ICD-10-CM | POA: Diagnosis not present

## 2016-03-21 DIAGNOSIS — I739 Peripheral vascular disease, unspecified: Secondary | ICD-10-CM | POA: Diagnosis not present

## 2016-04-03 DIAGNOSIS — J45909 Unspecified asthma, uncomplicated: Secondary | ICD-10-CM | POA: Diagnosis not present

## 2016-04-10 ENCOUNTER — Encounter: Payer: Self-pay | Admitting: Gastroenterology

## 2016-04-10 DIAGNOSIS — E1142 Type 2 diabetes mellitus with diabetic polyneuropathy: Secondary | ICD-10-CM | POA: Diagnosis not present

## 2016-04-17 DIAGNOSIS — Z0000 Encounter for general adult medical examination without abnormal findings: Secondary | ICD-10-CM | POA: Diagnosis not present

## 2016-04-17 DIAGNOSIS — Z6822 Body mass index (BMI) 22.0-22.9, adult: Secondary | ICD-10-CM | POA: Diagnosis not present

## 2016-04-17 DIAGNOSIS — J310 Chronic rhinitis: Secondary | ICD-10-CM | POA: Diagnosis not present

## 2016-04-17 DIAGNOSIS — Z1389 Encounter for screening for other disorder: Secondary | ICD-10-CM | POA: Diagnosis not present

## 2016-05-01 ENCOUNTER — Ambulatory Visit (INDEPENDENT_AMBULATORY_CARE_PROVIDER_SITE_OTHER): Payer: Medicare Other | Admitting: Gastroenterology

## 2016-05-01 ENCOUNTER — Encounter: Payer: Self-pay | Admitting: Gastroenterology

## 2016-05-01 ENCOUNTER — Other Ambulatory Visit: Payer: Self-pay | Admitting: Gastroenterology

## 2016-05-01 ENCOUNTER — Other Ambulatory Visit: Payer: Self-pay

## 2016-05-01 VITALS — BP 126/71 | HR 87 | Temp 97.6°F | Ht 62.0 in | Wt 123.6 lb

## 2016-05-01 DIAGNOSIS — R1310 Dysphagia, unspecified: Secondary | ICD-10-CM

## 2016-05-01 DIAGNOSIS — K219 Gastro-esophageal reflux disease without esophagitis: Secondary | ICD-10-CM

## 2016-05-01 DIAGNOSIS — J45909 Unspecified asthma, uncomplicated: Secondary | ICD-10-CM | POA: Diagnosis not present

## 2016-05-01 DIAGNOSIS — D649 Anemia, unspecified: Secondary | ICD-10-CM | POA: Diagnosis not present

## 2016-05-01 NOTE — Assessment & Plan Note (Addendum)
SYMPTOMS NOT IDEALLY CONTROLLED. Liquids come back through her nose. Weight up 3 lbs since NOV 2016.  COMPLETE MODIFIED BARIUM SWALLOW WITHIN THE NEXT 7 DAYS. FOLLOW UP IN 4 MOS.

## 2016-05-01 NOTE — Assessment & Plan Note (Signed)
NO WARNING SIGNS/SYMPTOMS. LABS DRAWN AT OUTSIDE PROVIDER.  CBC/FERRITIN WITHIN 7 DAYS. HAVE LABS FAZED TO ME. PT PREFERS OUTSIDE LAB. FOLLOW UP IN 4 MOS.

## 2016-05-01 NOTE — Progress Notes (Signed)
CC'ED TO PCP 

## 2016-05-01 NOTE — Progress Notes (Signed)
Subjective:    Patient ID: Christine Leblanc, female    DOB: Nov 12, 1929, 82 y.o.   MRN: [id number]  Christine Morel, MD  HPI PT HAVING TROUBLE WITH ARTHRITIS AND USING TYLENOL PRN.  NOW SEEING A NP-c. WORRIED ABOUT HER DEXILANT. LOOKED AT SIDE EFFECTS. DAUGHTER WAS HAVING TROUBLE TAKING DEXILANT. NOW SHE IS ON OMEPRAZOLE. SCARED OF DEXILANT SIDE EFFECTS: SEIZURES, JERKING, INVOLUNTARY. DEVELOPED HICCUPS MORE FREQUENTLY. NOT SURE IF SHE CAN DO WITHOUT DEXILANT. GETS UPSET MORE EASILY. TENSION MAY BE PART OF IT.  TAKING BEENO.  TROUBLE WITH WATERY RUNNY NOSE. RX MEDS BUT DIDN'T TAKE THEM DUE TO SIDE EFFECTS POTENTIAL: DIZZINESS. COFFEE CAME OUT OF HER NOSE. OTHER LIQUIDS AS WELL AND HAPPENING MORE FREQUENTLY.  PT DENIES FEVER, CHILLS, HEMATOCHEZIA, HEMATEMESIS, nausea, vomiting, melena, diarrhea, CHEST PAIN, SHORTNESS OF BREATH,  CHANGE IN BOWEL IN HABITS, constipation, abdominal pain,  problems with sedation, heartburn or indigestion.  Past Medical History  Diagnosis Date  . Syncope and collapse 2007    Possible CVA in 2012 with left lower extremity weakness; refused hospitalization; CT-Atrophy and chronic microvascular ischemic change.   . Asthma   . Gastroesophageal reflux disease     Hiatal hernia; distal esophageal web requiring dilatation; gastric polyps; gastritis; refuses colonoscopy  . Lymphocytic thyroiditis   . Multiple thyroid nodules 2010    Adenomatous; thyroidectomy in 2010  . Hyperlipidemia 04/27/2011  . Anemia   . GERD (gastroesophageal reflux disease)   . Chronic neck pain   . Chronic back pain   . Pneumonia     hosp. for pneumonia- long time ago   . Hypothyroidism   . Seizures [place]     yes- as a child- & into adult years, states she took med. for them at one time, stopped at 32 yrs. of age   . Degenerative joint disease     Left shoulder; cervical spine, knees & hands   . Cancer ([place])     skin Ca- ? basal cell   . History of stress test 1990's    stress  test done under the care of Dr. Lattie Haw & Dr. Gwenlyn Found, now being followed by Dr. Harl Bowie- in [place] , recently seen & told to f/U in one yr.   . Hypertension   . Osteoarthritis of right knee 07/27/2014  . Primary localized osteoarthrosis of right shoulder 06/21/2015    Past Surgical History  Procedure Laterality Date  . Abdominal hysterectomy      fibroids  . Prolapsed uterine fibroid ligation      outcomed with rectocele & cystocele  . Total thyroidectomy  2010  . Esophagogastroduodenoscopy  12/10    Dr. 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 Cataract extraction      Bilateral; redo surgery on the right for incomplete primary procedure  . Colonoscopy  Remote  . Shoulder surgery      left  . Total knee arthroplasty Right 07/27/2014    Procedure: RIGHT TOTAL KNEE ARTHROPLASTY;  Surgeon: Johnny Bridge, MD;  Location: [facility];  Service: Orthopedics;  Laterality: Right;  . Esophagogastroduodenoscopy N/A 03/21/2015    Procedure: ESOPHAGOGASTRODUODENOSCOPY (EGD);  Surgeon: Danie Binder, MD;  [facility];  Service: Endoscopy;  Laterality: N/A;  1130   . Savory dilation N/A 03/21/2015    Procedure: SAVORY DILATION;  Surgeon: Danie Binder, MD;  [facility];  Service: Endoscopy;  Laterality: N/A;  . Eye surgery    .  Total shoulder arthroplasty Right 06/21/2015    Procedure: RIGHT TOTAL SHOULDER ARTHROPLASTY;  Surgeon: Marchia Bond, MD;  Location: [facility];  Service: Orthopedics;  Laterality: Right;   Allergies  Allergen Reactions  . Celecoxib Shortness Of Breath   Current Outpatient Prescriptions  Medication Sig Dispense Refill  . albuterol (PROVENTIL HFA) 108 (90 BASE) MCG/ACT inhaler Inhale 1-2 puffs into the lungs every 6 (six) hours as needed for wheezing or shortness of breath. Wheezing, Asthma Symptoms    . amLODipine (NORVASC) 2.5 MG tablet Take 2.5 mg by mouth daily.    . Ascorbic Acid (VITAMIN C PO)  Take 1 tablet by mouth daily.    Marland Kitchen aspirin 81 MG tablet Take 81 mg by mouth daily.    . baclofen (LIORESAL) 10 MG tablet Take 1 tablet (10 mg total) by mouth TID. As needed for muscle spasm    . calcium-vitamin D (OSCAL WITH D) 500-200 MG-UNIT per tablet Take 1 tablet by mouth 2 (two) times daily.     Marland Kitchen DEXILANT 60 MG capsule Take 1 capsule (60 mg total) by mouth daily. WITH BREAKFAST    . VALIUM 5 MG tablet Take 1/2-1 tablet by mouth at bedtime as needed for sleep    . FLUZONE HIGH-DOSE 0.5 ML SUSY inject 0.5 milliliter intramuscularly    . Glucosamine HCl (GLUCOSAMINE PO) Take 1 tablet by mouth 2 (two) times daily.    Marland Kitchen levocetirizine (XYZAL) 5 MG tablet Take 5 mg by mouth every evening.      Marland Kitchen lSYNTHROID TAKE 1 TABLET DAILY    . montelukast (SINGULAIR) 10 MG tablet Take 10 mg by mouth at bedtime.    Marland Kitchen FISH OIL 1200 MG CAPS Take 1 capsule by mouth daily.     . pravastatin (PRAVACHOL) 80 MG tablet Take 1 tablet (80 mg total) by mouth every evening.     VIT B6 Take 100 mg by mouth at bedtime.    Marland Kitchen PERI-COLACE PO Take 1 tablet by mouth 2 (two) times daily.     . CYANOCOBALAMIN 1000 MCG tablet Take 1,000 mcg by mouth every morning.    . vitamin E 400 UNIT capsule Take 400 Units by mouth at bedtime.     .       Review of Systems PER HPI OTHERWISE ALL SYSTEMS ARE NEGATIVE.    Objective:   Physical Exam  Constitutional: She is oriented to person, place, and time. She appears well-developed and well-nourished. No distress.  HENT:  Head: Normocephalic and atraumatic.  Mouth/Throat: Oropharynx is clear and moist. No oropharyngeal exudate.  Eyes: Pupils are equal, round, and reactive to light. No scleral icterus.  Neck: Normal range of motion. Neck supple.  Cardiovascular: Normal rate, regular rhythm and normal heart sounds.   Pulmonary/Chest: Effort normal and breath sounds normal. No respiratory distress.  Abdominal: Soft. Bowel sounds are normal. She exhibits no distension. There is no  tenderness.  Musculoskeletal: She exhibits no edema.  Lymphadenopathy:    She has no cervical adenopathy.  Neurological: She is alert and oriented to person, place, and time.  Psychiatric: She has a normal mood and affect.  Vitals reviewed.     Assessment & Plan:

## 2016-05-01 NOTE — Patient Instructions (Signed)
STOP DEXILANT. CALL IN 7 DAYS IF YOU HAVE HEARTBURN THAT IS OUT OF CONTROL AND I WILL SEND A PRESCRIPTION FOR OMEPRAZOLE TO YOUR PHARMACY.  COMPLETE MODIFIED BARIUM SWALLOW WITHIN THE NEXT 7 DAYS.  COMPLETE LABS WITHIN 7 DAYS AND HAVE THEM FAXED TO MY OFFICE.  FOLLOW UP IN 4 MOS.

## 2016-05-01 NOTE — Progress Notes (Signed)
ON RECALL  °

## 2016-05-01 NOTE — Assessment & Plan Note (Signed)
SYMPTOMS CONTROLLED/RESOLVED.  TRIAL OFF PPI DUE TO CONCERN FOR SID EFFECT. PT PREFERS TO CHANGE TO OMEPRAZOLE IF NEEDED. STOP DEXILANT. CALL IN 7 DAYS IF HEARTBURN  IS OUT OF CONTROL AND WILL SEND A PRESCRIPTION FOR OMEPRAZOLE TOPHARMACY. FOLLOW UP IN 4 MOS.

## 2016-05-02 ENCOUNTER — Telehealth: Payer: Self-pay

## 2016-05-02 LAB — CBC WITH DIFFERENTIAL/PLATELET
Basophils Absolute: 0 10*3/uL (ref 0.0–0.2)
Basos: 1 %
EOS (ABSOLUTE): 0.7 10*3/uL — ABNORMAL HIGH (ref 0.0–0.4)
EOS: 11 %
HEMATOCRIT: 33.8 % — AB (ref 34.0–46.6)
HEMOGLOBIN: 11.2 g/dL (ref 11.1–15.9)
IMMATURE GRANULOCYTES: 0 %
Immature Grans (Abs): 0 10*3/uL (ref 0.0–0.1)
Lymphocytes Absolute: 1.6 10*3/uL (ref 0.7–3.1)
Lymphs: 24 %
MCH: 29.3 pg (ref 26.6–33.0)
MCHC: 33.1 g/dL (ref 31.5–35.7)
MCV: 89 fL (ref 79–97)
Monocytes Absolute: 0.7 10*3/uL (ref 0.1–0.9)
Monocytes: 10 %
NEUTROS PCT: 54 %
Neutrophils Absolute: 3.8 10*3/uL (ref 1.4–7.0)
Platelets: 181 10*3/uL (ref 150–379)
RBC: 3.82 x10E6/uL (ref 3.77–5.28)
RDW: 14.2 % (ref 12.3–15.4)
WBC: 6.9 10*3/uL (ref 3.4–10.8)

## 2016-05-02 LAB — FERRITIN: Ferritin: 117 ng/mL (ref 15–150)

## 2016-05-02 NOTE — Telephone Encounter (Signed)
Pt's results are back from [facility] and are on [facility] chair

## 2016-05-03 ENCOUNTER — Telehealth: Payer: Self-pay | Admitting: Gastroenterology

## 2016-05-03 NOTE — Telephone Encounter (Signed)
Pt called this afternoon to say that she stopped taking dexilant on Wednesday and by 7pm her stomach starting bothering her again so she took another one. She is requesting a different prescription. She said it starts with O and could we fax it to [facility]

## 2016-05-04 MED ORDER — OMEPRAZOLE 20 MG PO CPDR: DELAYED_RELEASE_CAPSULE | ORAL | Status: DC

## 2016-05-04 NOTE — Telephone Encounter (Signed)
REVIEWED-NO ADDITIONAL RECOMMENDATIONS. 

## 2016-05-04 NOTE — Telephone Encounter (Addendum)
PLEASE CALL PT. [facility].  TAKE 30 MINUTES PRIOR TO YOUR MEALS once or TWICE DAILY-3 mo supply, refill X 1 yr. HER BLOOD COUNT AND IRON STORES ARE NORMAL.

## 2016-05-07 ENCOUNTER — Other Ambulatory Visit: Payer: Self-pay | Admitting: Gastroenterology

## 2016-05-07 DIAGNOSIS — R1310 Dysphagia, unspecified: Secondary | ICD-10-CM

## 2016-05-07 NOTE — Telephone Encounter (Signed)
Pt is aware.  

## 2016-05-09 ENCOUNTER — Ambulatory Visit (HOSPITAL_COMMUNITY)
Admission: RE | Admit: 2016-05-09 | Discharge: 2016-05-09 | Disposition: A | Discharge status: Home / Self Care | Payer: Medicare Other | Source: Ambulatory Visit | Attending: Gastroenterology | Admitting: Gastroenterology

## 2016-05-09 ENCOUNTER — Ambulatory Visit (HOSPITAL_COMMUNITY): Payer: Medicare Other | Admitting: Speech Pathology

## 2016-05-09 ENCOUNTER — Other Ambulatory Visit (HOSPITAL_COMMUNITY): Payer: Self-pay | Admitting: *Deleted

## 2016-05-09 ENCOUNTER — Ambulatory Visit (HOSPITAL_COMMUNITY): Payer: Medicare Other | Attending: Gastroenterology | Admitting: Speech Pathology

## 2016-05-09 ENCOUNTER — Other Ambulatory Visit: Payer: Self-pay | Admitting: Gastroenterology

## 2016-05-09 ENCOUNTER — Ambulatory Visit (HOSPITAL_COMMUNITY): Admission: RE | Admit: 2016-05-09 | Payer: Medicare Other | Source: Ambulatory Visit

## 2016-05-09 ENCOUNTER — Other Ambulatory Visit: Payer: Self-pay

## 2016-05-09 DIAGNOSIS — R1310 Dysphagia, unspecified: Secondary | ICD-10-CM

## 2016-05-09 DIAGNOSIS — T17300A Unspecified foreign body in larynx causing asphyxiation, initial encounter: Secondary | ICD-10-CM | POA: Diagnosis not present

## 2016-05-09 DIAGNOSIS — R1312 Dysphagia, oropharyngeal phase: Secondary | ICD-10-CM | POA: Diagnosis not present

## 2016-05-09 NOTE — Therapy (Signed)
[name], [place], [address] Phone: [phone]   Fax:  [phone]  Modified Barium Swallow  Patient Details  Name: Christine Leblanc MRN: [id number] Date of Birth: Apr 19, 1929 No Data Recorded  Encounter Date: 05/09/2016      End of Session - 05/09/16 1459    Visit Number 1   Number of Visits 8   Date for SLP Re-Evaluation 06/29/16   Authorization Type Medicare   SLP Start Time 54   SLP Stop Time  1400   SLP Time Calculation (min) 51 min   Activity Tolerance Patient tolerated treatment well      Past Medical History  Diagnosis Date  . Syncope and collapse 2007    Possible CVA in 2012 with left lower extremity weakness; refused hospitalization; CT-Atrophy and chronic microvascular ischemic change.   . Asthma   . Gastroesophageal reflux disease     Hiatal hernia; distal esophageal web requiring dilatation; gastric polyps; gastritis; refuses colonoscopy  . Lymphocytic thyroiditis   . Multiple thyroid nodules 2010    Adenomatous; thyroidectomy in 2010  . Hyperlipidemia 04/27/2011  . Anemia   . GERD (gastroesophageal reflux disease)   . Chronic neck pain   . Chronic back pain   . Pneumonia     hosp. for pneumonia- long time ago   . Hypothyroidism   . Seizures ([place])     yes- as a child- & into adult years, states she took med. for them at one time, stopped at 32 yrs. of age   . Degenerative joint disease     Left shoulder; cervical spine, knees & hands   . Cancer ([place])     skin Ca- ? basal cell   . History of stress test 1990's    stress test done under the care of Dr. Lattie Haw & Dr. Gwenlyn Found, now being followed by Dr. Harl Bowie- in [place] , recently seen & told to f/U in one yr.   . Hypertension   . Osteoarthritis of right knee 07/27/2014  . Primary localized osteoarthrosis of right shoulder 06/21/2015    Past Surgical History  Procedure Laterality Date  . Abdominal hysterectomy      fibroids  .  Prolapsed uterine fibroid ligation      outcomed with rectocele & cystocele  . Total thyroidectomy  2010  . Esophagogastroduodenoscopy  12/10    Dr. 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 Cataract extraction      Bilateral; redo surgery on the right for incomplete primary procedure  . Colonoscopy  Remote  . Shoulder surgery      left  . Total knee arthroplasty Right 07/27/2014    Procedure: RIGHT TOTAL KNEE ARTHROPLASTY;  Surgeon: Johnny Bridge, MD;  Location: [facility];  Service: Orthopedics;  Laterality: Right;  . Esophagogastroduodenoscopy N/A 03/21/2015    Procedure: ESOPHAGOGASTRODUODENOSCOPY (EGD);  Surgeon: Danie Binder, MD;  [facility];  Service: Endoscopy;  Laterality: N/A;  1130   . Savory dilation N/A 03/21/2015    Procedure: SAVORY DILATION;  Surgeon: Danie Binder, MD;  [facility];  Service: Endoscopy;  Laterality: N/A;  . Eye surgery    . Total shoulder arthroplasty Right 06/21/2015    Procedure: RIGHT TOTAL SHOULDER ARTHROPLASTY;  Surgeon: Marchia Bond, MD;  Location: [facility];  Service: Orthopedics;  Laterality: Right;    There were no vitals filed for this visit.  Subjective Assessment - 05/09/16 1427    Subjective "I occasionally have liquids come out of my nose. Sometimes things get hung."   Special Tests MBSS   Currently in Pain? No/denies             General - 05/09/16 1428    General Information   Date of Onset 05/02/16   HPI [name] is an 82 yo woman who was referred for MBSS by Dr. Oneida Alar due to reports of dysphagia with liquids occasionally coming out of her nose.Christine Leblanc Pt is a former smoker (quit 37 years ago), had total thyroidectomy in 2010, and had EGD with savory dilation 03/21/2015. She lives at home with her daughter (who has seizures and needs assist). She denies recent bouts of PNA, but is followed by Dr. Harold Hedge for allergies and asthma and Dr. Benjamine Mola for  hearing loss in right ear. Pt has history of GERD, hiatal hernia, distal esophageal web requiring dilation, gastric polyps, and gastritis. She is a former Therapist, sports.    Type of Study Bedside Swallow Evaluation   Previous Swallow Assessment EGD 03/20/2105   Diet Prior to this Study Regular;Thin liquids  Pt tends to eat softer foods   Temperature Spikes Noted No   Respiratory Status Room air   History of Recent Intubation No   Behavior/Cognition Alert;Cooperative;Pleasant mood   Oral Cavity Assessment Within Functional Limits   Oral Care Completed by SLP No   Oral Cavity - Dentition Dentures, top  partials   Vision Functional for self feeding   Self-Feeding Abilities Able to feed self   Patient Positioning Upright in chair   Baseline Vocal Quality Hoarse;Wet   Volitional Cough Strong   Volitional Swallow Able to elicit   Anatomy Within functional limits  evidence of thyroidectomy   Pharyngeal Secretions Not observed secondary MBS  pt reports occasional loss of oral saliva            Oral Preparation/Oral Phase - 05/09/16 1430    Oral Preparation/Oral Phase   Oral Phase Impaired   Oral - Nectar   Oral - Nectar Cup Within functional limits   Oral - Thin   Oral - Thin Teaspoon Decreased bolus cohesion   Oral - Thin Cup Decreased bolus cohesion   Oral - Solids   Oral - Puree Within functional limits   Oral - Regular Delayed A-P transit;Within functional limits   Oral - Pill Within functional limits   Electrical stimulation - Oral Phase   Was Electrical Stimulation Used No          Pharyngeal Phase - 05/09/16 1431    Pharyngeal Phase   Pharyngeal Phase Impaired   Pharyngeal - Nectar   Pharyngeal- Nectar Cup Delayed swallow initiation;Swallow initiation at vallecula;Reduced epiglottic inversion;Reduced airway/laryngeal closure;Reduced tongue base retraction;Penetration/Apiration after swallow;Trace aspiration;Pharyngeal residue - valleculae;Pharyngeal residue - pyriform    Pharyngeal Material does not enter airway;Material enters airway, passes BELOW cords without attempt by patient to eject out (silent aspiration);Material enters airway, passes BELOW cords then ejected out;Material enters airway, CONTACTS cords and then ejected out   Pharyngeal - Thin   Pharyngeal- Thin Teaspoon Delayed swallow initiation;Swallow initiation at pyriform sinus;Reduced pharyngeal peristalsis;Reduced epiglottic inversion;Reduced laryngeal elevation;Reduced airway/laryngeal closure;Reduced tongue base retraction;Penetration/Aspiration during swallow;Penetration/Apiration after swallow;Trace aspiration;Pharyngeal residue - valleculae;Pharyngeal residue - pyriform  premature spillage   Pharyngeal Material enters airway, passes BELOW cords without attempt by patient to eject out (silent aspiration)   Pharyngeal- Thin Cup Swallow initiation at pyriform sinus;Reduced pharyngeal peristalsis;Reduced epiglottic  inversion;Reduced anterior laryngeal mobility;Reduced laryngeal elevation;Reduced airway/laryngeal closure;Reduced tongue base retraction;Penetration/Aspiration during swallow;Penetration/Apiration after swallow;Moderate aspiration;Pharyngeal residue - valleculae;Pharyngeal residue - pyriform   Pharyngeal Material enters airway, remains ABOVE vocal cords then ejected out;Material enters airway, passes BELOW cords without attempt by patient to eject out (silent aspiration)   Pharyngeal - Solids   Pharyngeal- Puree Swallow initiation at vallecula;Reduced epiglottic inversion;Reduced anterior laryngeal mobility;Reduced laryngeal elevation;Reduced tongue base retraction;Pharyngeal residue - valleculae;Pharyngeal residue - pyriform   Pharyngeal- Regular Delayed swallow initiation-vallecula;Delayed swallow initiation-pyriform sinuses;Reduced epiglottic inversion;Reduced anterior laryngeal mobility;Reduced laryngeal elevation;Reduced tongue base retraction;Pharyngeal residue - valleculae   Pharyngeal-  Pill Delayed swallow initiation;Swallow initiation at vallecula   Pharyngeal Phase - Comment   Pharyngeal Comment Pt benefited from oral bolus hold, head turn left with chin tuck and swallow, clear throat, swallow   Electrical Stimulation - Pharyngeal Phase   Was Electrical Stimulation Used No          Cricopharyngeal Phase - 05/09/16 1457    Cervical Esophageal Phase   Cervical Esophageal Phase Within functional limits              SLP Short Term Goals - 05/09/16 1502    SLP SHORT TERM GOAL #1   Title Pt will complete oropharyngeal exercises as assigned 3x/day with use of written cue.   Baseline No exercises at this point   Time 6   Period Weeks   Status New   SLP SHORT TERM GOAL #2   Title Pt will be evaluated by ENT to determine if vocal function exercises are indicated to reduce risk of aspiration and increase vocal quality.   Baseline Will see ENT tomorrow per pt report   Time 6   Period Weeks   Status New   SLP SHORT TERM GOAL #3   Title Pt will demonstrate safe and efficient consumption of D3/mech soft diet with NTL with use of compensatory strategies (bolus hold, head turn to LEFT with chin tuck) and min assist   Baseline Introduced today with written cues provided- max assist   Time 6   Period Weeks   Status New          SLP Long Term Goals - 05/09/16 1506    SLP LONG TERM GOAL #1   Title Same as short          Plan - 05/09/16 1500    Clinical Impression Statement Pt presents with mild oral phase and moderate/severe pharyngeal phase dysphagia with both sensory and motor deficits characterized by decreased oral control with premature spillage over base of tongue (better with cues to hold bolus orally first), delay in swallow initiation with swallow trigger after spilling to pyriforms with liquids, decreased tongue base retraction and hyolaryngeal excursion resulting in penetration and aspiration during and after the swallow with thin liquids (this  occurred on first tsp presentation and was silent/no cough and with subsequent cup sips), residuals in valleculae and pyriforms after the swallow, and penetration and variable aspiration with nectars. Pt with moderate residuals in valleculae and pyriforms after the swallow. Pt able to follow directions for postural strategies so several were trialed and most effective found to be: Nectar-thick liquids, hold bolus orally, turn head over left shoulder, tuck chin, swallow, clear throat and repeat swallow. This was effective greater than 50% of trials with thins, however silent aspiration of thins did occur at times despite strategies. Recommend D3/mech soft with NTL with head turn LEFT with chin tuck. Also recommend dysphagia therapy for oropharyngeal exercises (  BOT, CTAR) and diet toleration management. Also recommend ENT consult (pt sees Dr. Benjamine Mola tomorrow) due to suspected left pharyngeal weakness and hoarse vocal quality.   Speech Therapy Frequency 2x / week   Duration 4 weeks   Treatment/Interventions Aspiration precaution training;Pharyngeal strengthening exercises;Diet toleration management by SLP;Compensatory techniques;Trials of upgraded texture/liquids;Cueing hierarchy;SLP instruction and feedback;Compensatory strategies;Patient/family education   Potential to Achieve Goals Fair   Potential Considerations Severity of impairments   SLP Home Exercise Plan Pt will be independent with HEP as assigned to facilitate carryover of treatment strategies in home environment to decrease risk of aspiration PNA   Consulted and Agree with Plan of Care Patient      Patient will benefit from skilled therapeutic intervention in order to improve the following deficits and impairments:   Dysphagia, oropharyngeal phase      G-Codes - 06/04/2016 1506    Functional Assessment Tool Used MBSS; clinical judgment   Functional Limitations Swallowing   Swallow Current Status [id number]) At least 40 percent but less than 60  percent impaired, limited or restricted   Swallow Goal Status [id number]) At least 20 percent but less than 40 percent impaired, limited or restricted          Recommendations/Treatment - June 04, 2016 1457    Swallow Evaluation Recommendations   SLP Diet Recommendations Dysphagia 3 (mechanical soft);Nectar   Thickener user Simply thick   Liquid Administration via [employer];No straw   Medication Administration Whole meds with puree   Supervision Patient able to self feed   Compensations Small sips/bites;Chin tuck;Clear throat after each swallow;Effortful swallow;Multiple dry swallows after each bite/sip  head turn LEFT   Postural Changes Seated upright at 90 degrees;Remain upright for at least 30 minutes after feeds/meals          Prognosis - 06/04/16 1458    Prognosis   Prognosis for Safe Diet Advancement Fair   Barriers to Reach Goals Severity of deficits   Barriers/Prognosis Comment Will need follow up dysphagia therapy   Individuals Consulted   Consulted and Agree with Results and Recommendations Patient   Report Sent to  Referring physician;Other (comment)  will send to ENT, allergy, GI, and PCP      Problem List Patient Active Problem List   Diagnosis Date Noted  . Primary localized osteoarthrosis of right shoulder 06/21/2015  . S/P shoulder replacement 06/21/2015  . Dysphagia, idiopathic 11/29/2014  . Delirium, drug-induced ([place]) 07/29/2014  . Atelectasis 07/29/2014  . Osteoarthritis of right knee 07/27/2014  . Knee osteoarthritis 07/27/2014  . Postsurgical hypothyroidism 04/22/2014  . Unspecified hypothyroidism 10/12/2013  . Syncope and collapse   . Anemia, normocytic normochromic 04/26/2012  . Gastroesophageal reflux disease   . Lymphocytic thyroiditis   . Hypertension 12/07/2011  . History of TIAs 12/07/2011  . Leg pain, bilateral 04/27/2011  . Hyperlipidemia 04/27/2011   Thank you,  Genene Churn, [place]  [facility] 2016-06-04, 3:07 PM  [place], [place], [address] Phone: [phone]   Fax:  [phone]  Name: LARRA CACACE MRN: [id number] Date of Birth: [birth year]

## 2016-05-10 ENCOUNTER — Ambulatory Visit (INDEPENDENT_AMBULATORY_CARE_PROVIDER_SITE_OTHER): Payer: Medicare Other | Admitting: Otolaryngology

## 2016-05-10 DIAGNOSIS — H6123 Impacted cerumen, bilateral: Secondary | ICD-10-CM

## 2016-05-10 DIAGNOSIS — H903 Sensorineural hearing loss, bilateral: Secondary | ICD-10-CM | POA: Diagnosis not present

## 2016-05-10 DIAGNOSIS — R1312 Dysphagia, oropharyngeal phase: Secondary | ICD-10-CM | POA: Diagnosis not present

## 2016-05-10 NOTE — Addendum Note (Signed)
Addended by: Genene Churn V on: 05/10/2016 08:00 PM   Modules accepted: Orders

## 2016-05-11 ENCOUNTER — Telehealth (HOSPITAL_COMMUNITY): Payer: Self-pay

## 2016-05-11 NOTE — Telephone Encounter (Signed)
05/11/16 cx her 5/31 appt.  Said she just can't come in at [facility] [facility]

## 2016-05-15 ENCOUNTER — Encounter (HOSPITAL_COMMUNITY): Payer: Medicare Other | Admitting: Speech Pathology

## 2016-05-22 ENCOUNTER — Ambulatory Visit (HOSPITAL_COMMUNITY): Payer: Medicare Other | Admitting: Speech Pathology

## 2016-05-22 DIAGNOSIS — R1312 Dysphagia, oropharyngeal phase: Secondary | ICD-10-CM

## 2016-05-22 DIAGNOSIS — J45909 Unspecified asthma, uncomplicated: Secondary | ICD-10-CM | POA: Diagnosis not present

## 2016-05-22 NOTE — Therapy (Signed)
[place], [place], [address] Phone: [phone]   Fax:  [phone]  Speech Language Pathology Treatment  Patient Details  Name: Christine Leblanc MRN: [id number] Date of Birth: June 13, 1929 No Data Recorded  Encounter Date: 05/22/2016      End of Session - 05/22/16 1930    Visit Number 2   Number of Visits 8   Date for SLP Re-Evaluation 06/29/16   Authorization Type Medicare   SLP Start Time 1350   SLP Stop Time  1430   SLP Time Calculation (min) 40 min   Activity Tolerance Patient tolerated treatment well      Past Medical History  Diagnosis Date  . Syncope and collapse 2007    Possible CVA in 2012 with left lower extremity weakness; refused hospitalization; CT-Atrophy and chronic microvascular ischemic change.   . Asthma   . Gastroesophageal reflux disease     Hiatal hernia; distal esophageal web requiring dilatation; gastric polyps; gastritis; refuses colonoscopy  . Lymphocytic thyroiditis   . Multiple thyroid nodules 2010    Adenomatous; thyroidectomy in 2010  . Hyperlipidemia 04/27/2011  . Anemia   . GERD (gastroesophageal reflux disease)   . Chronic neck pain   . Chronic back pain   . Pneumonia     hosp. for pneumonia- long time ago   . Hypothyroidism   . Seizures ([place])     yes- as a child- & into adult years, states she took med. for them at one time, stopped at 32 yrs. of age   . Degenerative joint disease     Left shoulder; cervical spine, knees & hands   . Cancer ([place])     skin Ca- ? basal cell   . History of stress test 1990's    stress test done under the care of Dr. Lattie Haw & Dr. Gwenlyn Found, now being followed by Dr. Harl Bowie- in [place] , recently seen & told to f/U in one yr.   . Hypertension   . Osteoarthritis of right knee 07/27/2014  . Primary localized osteoarthrosis of right shoulder 06/21/2015    Past Surgical History  Procedure Laterality Date  . Abdominal hysterectomy     fibroids  . Prolapsed uterine fibroid ligation      outcomed with rectocele & cystocele  . Total thyroidectomy  2010  . Esophagogastroduodenoscopy  12/10    Dr. 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 Cataract extraction      Bilateral; redo surgery on the right for incomplete primary procedure  . Colonoscopy  Remote  . Shoulder surgery      left  . Total knee arthroplasty Right 07/27/2014    Procedure: RIGHT TOTAL KNEE ARTHROPLASTY;  Surgeon: Johnny Bridge, MD;  Location: [facility];  Service: Orthopedics;  Laterality: Right;  . Esophagogastroduodenoscopy N/A 03/21/2015    Procedure: ESOPHAGOGASTRODUODENOSCOPY (EGD);  Surgeon: Danie Binder, MD;  [facility];  Service: Endoscopy;  Laterality: N/A;  1130   . Savory dilation N/A 03/21/2015    Procedure: SAVORY DILATION;  Surgeon: Danie Binder, MD;  [facility];  Service: Endoscopy;  Laterality: N/A;  . Eye surgery    . Total shoulder arthroplasty Right 06/21/2015    Procedure: RIGHT TOTAL SHOULDER ARTHROPLASTY;  Surgeon: Marchia Bond, MD;  Location: [facility];  Service: Orthopedics;  Laterality: Right;    There were no vitals filed for this visit.      Subjective  Assessment - 05/22/16 1927    Subjective "I have been adding the thickener to my liquids."   Currently in Pain? No/denies               ADULT SLP TREATMENT - 05/22/16 1927    General Information   Behavior/Cognition Alert;Cooperative;Pleasant mood   Patient Positioning Upright in chair   Oral care provided N/A   HPI Christine Leblanc is an 82 yo woman who was referred for MBSS by Dr. Oneida Alar due to reports of dysphagia with liquids occasionally coming out of her nose.Marland Kitchen Pt is a former smoker (quit 37 years ago), had total thyroidectomy in 2010, and had EGD with savory dilation 03/21/2015. She lives at home with her daughter (who has seizures and needs assist). She denies recent bouts of PNA, but is  followed by Dr. Harold Hedge for allergies and asthma and Dr. Benjamine Mola for hearing loss in right ear. Pt has history of GERD, hiatal hernia, distal esophageal web requiring dilation, gastric polyps, and gastritis. She is a former Therapist, sports.    Treatment Provided   Treatment provided Dysphagia   Dysphagia Treatment   Temperature Spikes Noted No   Respiratory Status Room air   Oral Cavity - Dentition Adequate natural dentition   Treatment Methods Therapeutic exercise;Compensation strategy training;Patient/caregiver education   Patient observed directly with PO's Yes   Type of PO's observed Nectar-thick liquids   Feeding Able to feed self   Liquids provided via Cup   Type of cueing Verbal   Amount of cueing Modified independent   Pain Assessment   Pain Assessment No/denies pain   Assessment / Recommendations / Plan   Plan Continue with current plan of care   Dysphagia Recommendations   Diet recommendations Dysphagia 3 (mechanical soft);Nectar-thick liquid   Liquids provided via Cup   Medication Administration Whole meds with puree   Supervision Patient able to self feed   Compensations Slow rate;Small sips/bites;Multiple dry swallows after each bite/sip;Effortful swallow   Postural Changes and/or Swallow Maneuvers Head turn left during swallow;Seated upright 90 degrees;Upright 30-60 min after meal;Out of bed for meals   General Recommendations   Oral Care Recommendations Oral care BID   Follow up Recommendations Outpatient SLP   Progression Toward Goals   Progression toward goals Progressing toward goals            SLP Short Term Goals - 05/22/16 1935    SLP SHORT TERM GOAL #1   Title Pt will complete oropharyngeal exercises as assigned 3x/day with use of written cue.   Baseline No exercises at this point   Time 6   Period Weeks   Status On-going   SLP SHORT TERM GOAL #2   Title Pt will be evaluated by ENT to determine if vocal function exercises are indicated to reduce risk of  aspiration and increase vocal quality.   Baseline Will see ENT tomorrow per pt report   Time 6   Period Weeks   Status On-going   SLP SHORT TERM GOAL #3   Title Pt will demonstrate safe and efficient consumption of D3/mech soft diet with NTL with use of compensatory strategies (bolus hold, head turn to LEFT with chin tuck) and min assist   Baseline Introduced today with written cues provided- max assist   Time 6   Period Weeks   Status On-going          SLP Long Term Goals - 05/22/16 1935    SLP LONG TERM GOAL #1  Title Same as short          Plan - 05/22/16 1931    Clinical Impression Statement Pt seen for dysphagia treatment following MBSS on May 10. She purchased thickener and has been thickening all liquids to nectar consistency. She also reports excellent oral care. She feels "hungry on this diet" so SLP encouraged her to liberalize some of the textures as she was trying to almost puree most foods. SLP provided education regarding mechanical soft textures and swallowing exercises. Pt able to return demonstrate exercises with mi/mod cues. Recommend continuing diet recommendations and complete exercises 3x/day and continue next session.   Speech Therapy Frequency 2x / week   Duration 4 weeks   Treatment/Interventions Aspiration precaution training;Pharyngeal strengthening exercises;Diet toleration management by SLP;Compensatory techniques;Trials of upgraded texture/liquids;Cueing hierarchy;SLP instruction and feedback;Compensatory strategies;Patient/family education   Potential to Achieve Goals Fair   Potential Considerations Severity of impairments   SLP Home Exercise Plan Pt will be independent with HEP as assigned to facilitate carryover of treatment strategies in home environment to decrease risk of aspiration PNA   Consulted and Agree with Plan of Care Patient      Patient will benefit from skilled therapeutic intervention in order to improve the following deficits and  impairments:   Dysphagia, oropharyngeal phase    Problem List Patient Active Problem List   Diagnosis Date Noted  . Primary localized osteoarthrosis of right shoulder 06/21/2015  . S/P shoulder replacement 06/21/2015  . Dysphagia, idiopathic 11/29/2014  . Delirium, drug-induced ([place]) 07/29/2014  . Atelectasis 07/29/2014  . Osteoarthritis of right knee 07/27/2014  . Knee osteoarthritis 07/27/2014  . Postsurgical hypothyroidism 04/22/2014  . Unspecified hypothyroidism 10/12/2013  . Syncope and collapse   . Anemia, normocytic normochromic 04/26/2012  . Gastroesophageal reflux disease   . Lymphocytic thyroiditis   . Hypertension 12/07/2011  . History of TIAs 12/07/2011  . Leg pain, bilateral 04/27/2011  . Hyperlipidemia 04/27/2011   Thank you,  Genene Churn, [place]  [facility] 05/22/2016, 7:36 PM  [place] [address] [place], [place], [address] Phone: [phone]   Fax:  [phone]   Name: FAIGA MELAND MRN: [id number] Date of Birth: 1929-08-24

## 2016-05-24 ENCOUNTER — Telehealth: Payer: Self-pay | Admitting: Gastroenterology

## 2016-05-24 NOTE — Telephone Encounter (Signed)
PLEASE CALL PT. I REVIEWED HER SWALLOWING EXAM. IN ADDITION TO SEEING DR. Benjamine Mola SHE SHOULD SEE NEUROLOGY DUE TO THE WEAKNESS IN HER SWALLOWING MECHANISM(Dx: OROPHARYNGEAL DYSPHAGIA). CONTINUE DIET MODIFICATION. OPV IN SEP 2017.

## 2016-05-25 NOTE — Telephone Encounter (Signed)
Called, many rings and no answer. Will mail a letter for her to call.

## 2016-05-30 ENCOUNTER — Ambulatory Visit (HOSPITAL_COMMUNITY): Payer: Medicare Other | Admitting: Speech Pathology

## 2016-06-04 ENCOUNTER — Ambulatory Visit (HOSPITAL_COMMUNITY): Payer: Medicare Other | Admitting: Speech Pathology

## 2016-06-04 ENCOUNTER — Other Ambulatory Visit: Payer: Self-pay

## 2016-06-04 DIAGNOSIS — R1312 Dysphagia, oropharyngeal phase: Secondary | ICD-10-CM

## 2016-06-04 NOTE — Telephone Encounter (Signed)
Referral has been made.

## 2016-06-04 NOTE — Telephone Encounter (Signed)
Pt is aware. OK to refer to neurology and she would like to be referred to Dr. Jannifer Franklin in [place].

## 2016-06-05 ENCOUNTER — Ambulatory Visit (HOSPITAL_COMMUNITY): Payer: Medicare Other | Attending: Gastroenterology | Admitting: Speech Pathology

## 2016-06-05 ENCOUNTER — Encounter (HOSPITAL_COMMUNITY): Payer: Self-pay | Admitting: Speech Pathology

## 2016-06-05 DIAGNOSIS — E1342 Other specified diabetes mellitus with diabetic polyneuropathy: Secondary | ICD-10-CM | POA: Diagnosis not present

## 2016-06-05 DIAGNOSIS — R1312 Dysphagia, oropharyngeal phase: Secondary | ICD-10-CM | POA: Diagnosis not present

## 2016-06-05 DIAGNOSIS — L851 Acquired keratosis [keratoderma] palmaris et plantaris: Secondary | ICD-10-CM | POA: Diagnosis not present

## 2016-06-05 DIAGNOSIS — B351 Tinea unguium: Secondary | ICD-10-CM | POA: Diagnosis not present

## 2016-06-05 NOTE — Therapy (Signed)
[place], [place], [address] Phone: [phone]   Fax:  [phone]  Speech Language Pathology Treatment  Patient Details  Name: Christine Leblanc MRN: [id number] Date of Birth: 05-Sep-1929 No Data Recorded  Encounter Date: 06/05/2016      End of Session - 06/05/16 1639    Visit Number 3   Number of Visits 8   Date for SLP Re-Evaluation 06/29/16   Authorization Type Medicare   SLP Start Time [id number]   SLP Stop Time  1700   SLP Time Calculation (min) 44 min   Activity Tolerance Patient tolerated treatment well      Past Medical History  Diagnosis Date  . Syncope and collapse 2007    Possible CVA in 2012 with left lower extremity weakness; refused hospitalization; CT-Atrophy and chronic microvascular ischemic change.   . Asthma   . Gastroesophageal reflux disease     Hiatal hernia; distal esophageal web requiring dilatation; gastric polyps; gastritis; refuses colonoscopy  . Lymphocytic thyroiditis   . Multiple thyroid nodules 2010    Adenomatous; thyroidectomy in 2010  . Hyperlipidemia 04/27/2011  . Anemia   . GERD (gastroesophageal reflux disease)   . Chronic neck pain   . Chronic back pain   . Pneumonia     hosp. for pneumonia- long time ago   . Hypothyroidism   . Seizures ([place])     yes- as a child- & into adult years, states she took med. for them at one time, stopped at 32 yrs. of age   . Degenerative joint disease     Left shoulder; cervical spine, knees & hands   . Cancer ([place])     skin Ca- ? basal cell   . History of stress test 1990's    stress test done under the care of Dr. Lattie Haw & Dr. Gwenlyn Found, now being followed by Dr. Harl Bowie- in [place] , recently seen & told to f/U in one yr.   . Hypertension   . Osteoarthritis of right knee 07/27/2014  . Primary localized osteoarthrosis of right shoulder 06/21/2015    Past Surgical History  Procedure Laterality Date  . Abdominal hysterectomy       fibroids  . Prolapsed uterine fibroid ligation      outcomed with rectocele & cystocele  . Total thyroidectomy  2010  . Esophagogastroduodenoscopy  12/10    Dr. 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 Cataract extraction      Bilateral; redo surgery on the right for incomplete primary procedure  . Colonoscopy  Remote  . Shoulder surgery      left  . Total knee arthroplasty Right 07/27/2014    Procedure: RIGHT TOTAL KNEE ARTHROPLASTY;  Surgeon: Johnny Bridge, MD;  Location: [facility];  Service: Orthopedics;  Laterality: Right;  . Esophagogastroduodenoscopy N/A 03/21/2015    Procedure: ESOPHAGOGASTRODUODENOSCOPY (EGD);  Surgeon: Danie Binder, MD;  [facility];  Service: Endoscopy;  Laterality: N/A;  1130   . Savory dilation N/A 03/21/2015    Procedure: SAVORY DILATION;  Surgeon: Danie Binder, MD;  [facility];  Service: Endoscopy;  Laterality: N/A;  . Eye surgery    . Total shoulder arthroplasty Right 06/21/2015    Procedure: RIGHT TOTAL SHOULDER ARTHROPLASTY;  Surgeon: Marchia Bond, MD;  Location: [facility];  Service: Orthopedics;  Laterality: Right;    There were no vitals filed for this visit.  Subjective Assessment - 06/05/16 1630    Subjective "Sometimes the thickener leaves a film in my mouth."   Currently in Pain? No/denies               ADULT SLP TREATMENT - 06/05/16 1633    General Information   Behavior/Cognition Alert;Cooperative;Pleasant mood   Patient Positioning Upright in chair   Oral care provided N/A   HPI Christine Leblanc is an 82 yo woman who was referred for MBSS by Dr. Oneida Alar due to reports of dysphagia with liquids occasionally coming out of her nose.Marland Kitchen Pt is a former smoker (quit 37 years ago), had total thyroidectomy in 2010, and had EGD with savory dilation 03/21/2015. She lives at home with her daughter (who has seizures and needs assist). She denies recent bouts of PNA, but  is followed by Dr. Harold Hedge for allergies and asthma and Dr. Benjamine Mola for hearing loss in right ear. Pt has history of GERD, hiatal hernia, distal esophageal web requiring dilation, gastric polyps, and gastritis. She is a former Therapist, sports.    Treatment Provided   Treatment provided Dysphagia   Dysphagia Treatment   Temperature Spikes Noted No   Respiratory Status Room air   Oral Cavity - Dentition Adequate natural dentition   Treatment Methods Therapeutic exercise;Compensation strategy training;Patient/caregiver education   Patient observed directly with PO's Yes   Type of PO's observed Nectar-thick liquids   Feeding Able to feed self   Liquids provided via Cup   Pharyngeal Phase Signs & Symptoms Multiple swallows   Type of cueing Verbal   Amount of cueing Modified independent   Pain Assessment   Pain Assessment No/denies pain   Assessment / Recommendations / Plan   Plan Continue with current plan of care   Dysphagia Recommendations   Diet recommendations Dysphagia 3 (mechanical soft);Nectar-thick liquid   Liquids provided via Cup   Medication Administration Whole meds with puree   Supervision Patient able to self feed   Compensations Slow rate;Small sips/bites;Multiple dry swallows after each bite/sip;Effortful swallow   Postural Changes and/or Swallow Maneuvers Head turn left during swallow;Seated upright 90 degrees;Upright 30-60 min after meal;Out of bed for meals   General Recommendations   Oral Care Recommendations Oral care BID   Follow up Recommendations Outpatient SLP   Progression Toward Goals   Progression toward goals Progressing toward goals            SLP Short Term Goals - 06/05/16 1753    SLP SHORT TERM GOAL #1   Title Pt will complete oropharyngeal exercises as assigned 3x/day with use of written cue.   Baseline No exercises at this point   Time 6   Period Weeks   Status On-going   SLP SHORT TERM GOAL #2   Title Pt will be evaluated by ENT to determine if vocal  function exercises are indicated to reduce risk of aspiration and increase vocal quality.   Baseline Will see ENT tomorrow per pt report   Time 6   Period Weeks   Status On-going   SLP SHORT TERM GOAL #3   Title Pt will demonstrate safe and efficient consumption of D3/mech soft diet with NTL with use of compensatory strategies (bolus hold, head turn to LEFT with chin tuck) and min assist   Baseline Introduced today with written cues provided- max assist   Time 6   Period Weeks   Status On-going          SLP Long Term Goals - 05/22/16  [place] #1   Title Same as short          Plan - 06/05/16 1737    Clinical Impression Statement Christine Leblanc reports good adherance to thickening liquids at home, however did state that she only had two cups of coffee today. SLP reinforced need to stay hydrated and suggested ways to have thickened liquids on hand and in fridge. She purchased the "Thick-It" brand of thickener and sometimes it tend to thicken over time so she has been thinning it down as needed with water. She complained of a "film" left in her mouth after drinking thickened liquids, so I encouraged her to try "Thicken Up Clear" as this tends to decrease that. Pt has been completing exercises at home 3x/day. SLP facilitated completion of exercises today and pt required mod verbal and tactile cues for completion. Pt presents with lingual weakness and was encouraged to continue with exercises at home. Pt consumed small cup sips of thin water with head turn to left with chin tuck. No overt signs of aspiration with occasional mild wet vocal quality (note: pt silently aspirated thins during MBSS despite strategies so we have only been trialing water). I have not received a copy of Dr. Deeann Saint report so I sent a request for him to send. Continue with exercises during Thursday's session. Dr. Oneida Alar made referral to neurology per chart review.    Speech Therapy Frequency 2x / week    Duration 4 weeks   Treatment/Interventions Aspiration precaution training;Pharyngeal strengthening exercises;Diet toleration management by SLP;Compensatory techniques;Trials of upgraded texture/liquids;Cueing hierarchy;SLP instruction and feedback;Compensatory strategies;Patient/family education   Potential to Achieve Goals Fair   Potential Considerations Severity of impairments   SLP Home Exercise Plan Pt will be independent with HEP as assigned to facilitate carryover of treatment strategies in home environment to decrease risk of aspiration PNA   Consulted and Agree with Plan of Care Patient      Patient will benefit from skilled therapeutic intervention in order to improve the following deficits and impairments:   Dysphagia, oropharyngeal phase    Problem List Patient Active Problem List   Diagnosis Date Noted  . Primary localized osteoarthrosis of right shoulder 06/21/2015  . S/P shoulder replacement 06/21/2015  . Dysphagia, idiopathic 11/29/2014  . Delirium, drug-induced ([place]) 07/29/2014  . Atelectasis 07/29/2014  . Osteoarthritis of right knee 07/27/2014  . Knee osteoarthritis 07/27/2014  . Postsurgical hypothyroidism 04/22/2014  . Unspecified hypothyroidism 10/12/2013  . Syncope and collapse   . Anemia, normocytic normochromic 04/26/2012  . Gastroesophageal reflux disease   . Lymphocytic thyroiditis   . Hypertension 12/07/2011  . History of TIAs 12/07/2011  . Leg pain, bilateral 04/27/2011  . Hyperlipidemia 04/27/2011   Thank you,  Genene Churn, [place]  [facility] 06/05/2016, 5:54 PM  [facility] [address] [place], [place], [address] Phone: [phone]   Fax:  [phone]   Name: Christine Leblanc MRN: [id number] Date of Birth: May 02, 1929

## 2016-06-06 ENCOUNTER — Ambulatory Visit (HOSPITAL_COMMUNITY): Payer: Medicare Other | Admitting: Speech Pathology

## 2016-06-07 ENCOUNTER — Telehealth (HOSPITAL_COMMUNITY): Payer: Self-pay | Admitting: Speech Pathology

## 2016-06-07 ENCOUNTER — Ambulatory Visit (HOSPITAL_COMMUNITY): Payer: Medicare Other | Admitting: Speech Pathology

## 2016-06-07 NOTE — Telephone Encounter (Signed)
Her daughter has fallen again and she can not leave her today

## 2016-06-11 ENCOUNTER — Ambulatory Visit (HOSPITAL_COMMUNITY): Payer: Medicare Other | Admitting: Speech Pathology

## 2016-06-12 ENCOUNTER — Ambulatory Visit (HOSPITAL_COMMUNITY): Payer: Medicare Other | Admitting: Speech Pathology

## 2016-06-12 DIAGNOSIS — R1312 Dysphagia, oropharyngeal phase: Secondary | ICD-10-CM | POA: Diagnosis not present

## 2016-06-12 DIAGNOSIS — J3089 Other allergic rhinitis: Secondary | ICD-10-CM | POA: Diagnosis not present

## 2016-06-12 NOTE — Therapy (Signed)
[place], [place], [address] Phone: [phone]   Fax:  [phone]  Speech Language Pathology Treatment  Patient Details  Name: Christine Leblanc MRN: [id number] Date of Birth: 1929-11-18 No Data Recorded  Encounter Date: 06/12/2016      End of Session - 06/12/16 1527    Visit Number 4   Number of Visits 8   Date for SLP Re-Evaluation 06/29/16   Authorization Type Medicare   SLP Start Time [id number]   SLP Stop Time  1545   SLP Time Calculation (min) 30 min   Activity Tolerance Patient tolerated treatment well      Past Medical History  Diagnosis Date  . Syncope and collapse 2007    Possible CVA in 2012 with left lower extremity weakness; refused hospitalization; CT-Atrophy and chronic microvascular ischemic change.   . Asthma   . Gastroesophageal reflux disease     Hiatal hernia; distal esophageal web requiring dilatation; gastric polyps; gastritis; refuses colonoscopy  . Lymphocytic thyroiditis   . Multiple thyroid nodules 2010    Adenomatous; thyroidectomy in 2010  . Hyperlipidemia 04/27/2011  . Anemia   . GERD (gastroesophageal reflux disease)   . Chronic neck pain   . Chronic back pain   . Pneumonia     hosp. for pneumonia- long time ago   . Hypothyroidism   . Seizures ([place])     yes- as a child- & into adult years, states she took med. for them at one time, stopped at 32 yrs. of age   . Degenerative joint disease     Left shoulder; cervical spine, knees & hands   . Cancer ([place])     skin Ca- ? basal cell   . History of stress test 1990's    stress test done under the care of Dr. Lattie Haw & Dr. Gwenlyn Found, now being followed by Dr. Harl Bowie- in [place] , recently seen & told to f/U in one yr.   . Hypertension   . Osteoarthritis of right knee 07/27/2014  . Primary localized osteoarthrosis of right shoulder 06/21/2015    Past Surgical History  Procedure Laterality Date  . Abdominal hysterectomy     fibroids  . Prolapsed uterine fibroid ligation      outcomed with rectocele & cystocele  . Total thyroidectomy  2010  . Esophagogastroduodenoscopy  12/10    Dr. 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 Cataract extraction      Bilateral; redo surgery on the right for incomplete primary procedure  . Colonoscopy  Remote  . Shoulder surgery      left  . Total knee arthroplasty Right 07/27/2014    Procedure: RIGHT TOTAL KNEE ARTHROPLASTY;  Surgeon: Johnny Bridge, MD;  Location: [facility];  Service: Orthopedics;  Laterality: Right;  . Esophagogastroduodenoscopy N/A 03/21/2015    Procedure: ESOPHAGOGASTRODUODENOSCOPY (EGD);  Surgeon: Danie Binder, MD;  [facility];  Service: Endoscopy;  Laterality: N/A;  1130   . Savory dilation N/A 03/21/2015    Procedure: SAVORY DILATION;  Surgeon: Danie Binder, MD;  [facility];  Service: Endoscopy;  Laterality: N/A;  . Eye surgery    . Total shoulder arthroplasty Right 06/21/2015    Procedure: RIGHT TOTAL SHOULDER ARTHROPLASTY;  Surgeon: Marchia Bond, MD;  Location: [facility];  Service: Orthopedics;  Laterality: Right;    There were no vitals filed for this visit.      Subjective  Assessment - 06/12/16 1524    Subjective "My daughter was in the hospital."   Currently in Pain? No/denies               ADULT SLP TREATMENT - 06/12/16 1524    General Information   Behavior/Cognition Alert;Cooperative;Pleasant mood   Patient Positioning Upright in chair   Oral care provided N/A   HPI Christine Leblanc is an 82 yo woman who was referred for MBSS by Dr. Oneida Alar due to reports of dysphagia with liquids occasionally coming out of her nose.Marland Kitchen Pt is a former smoker (quit 37 years ago), had total thyroidectomy in 2010, and had EGD with savory dilation 03/21/2015. She lives at home with her daughter (who has seizures and needs assist). She denies recent bouts of PNA, but is followed by  Dr. Harold Hedge for allergies and asthma and Dr. Benjamine Mola for hearing loss in right ear. Pt has history of GERD, hiatal hernia, distal esophageal web requiring dilation, gastric polyps, and gastritis. She is a former Therapist, sports.    Treatment Provided   Treatment provided Dysphagia   Dysphagia Treatment   Temperature Spikes Noted No   Respiratory Status Room air   Oral Cavity - Dentition Adequate natural dentition   Treatment Methods Therapeutic exercise;Compensation strategy training;Patient/caregiver education   Patient observed directly with PO's Yes   Type of PO's observed Thin liquids   Feeding Able to feed self   Liquids provided via Cup   Pharyngeal Phase Signs & Symptoms Multiple swallows   Type of cueing Verbal   Amount of cueing Modified independent   Pain Assessment   Pain Assessment No/denies pain   Assessment / Recommendations / Plan   Plan Continue with current plan of care   Dysphagia Recommendations   Diet recommendations Dysphagia 3 (mechanical soft);Nectar-thick liquid   Liquids provided via Cup   Medication Administration Whole meds with puree   Supervision Patient able to self feed   Compensations Slow rate;Small sips/bites;Multiple dry swallows after each bite/sip;Effortful swallow   Postural Changes and/or Swallow Maneuvers Head turn left during swallow;Seated upright 90 degrees;Upright 30-60 min after meal;Out of bed for meals   General Recommendations   Oral Care Recommendations Oral care BID   Follow up Recommendations Outpatient SLP   Progression Toward Goals   Progression toward goals Progressing toward goals            SLP Short Term Goals - 06/12/16 1527    SLP SHORT TERM GOAL #1   Title Pt will complete oropharyngeal exercises as assigned 3x/day with use of written cue.   Baseline No exercises at this point   Time 6   Period Weeks   Status On-going   SLP SHORT TERM GOAL #2   Title Pt will be evaluated by ENT to determine if vocal function exercises are  indicated to reduce risk of aspiration and increase vocal quality.   Baseline Will see ENT tomorrow per pt report   Time 6   Period Weeks   Status On-going   SLP SHORT TERM GOAL #3   Title Pt will demonstrate safe and efficient consumption of D3/mech soft diet with NTL with use of compensatory strategies (bolus hold, head turn to LEFT with chin tuck) and min assist   Baseline Introduced today with written cues provided- max assist   Time 6   Period Weeks   Status On-going          SLP Long Term Goals - 05/22/16 1935  SLP LONG TERM GOAL #1   Title Same as short          Plan - 06/12/16 1527    Clinical Impression Statement Christine Leblanc was seen for ongoing dysphagia intervention. She reports having a difficult week due to her daughter's hospitalization and now admission to SNF. She continues to thicken liquids at home, but does drink thin water when she is out. I reinforced the importance of good oral care to help prevent aspiration pneumonia. Exercises completed with min assist in session. Vocal quality improved this date. Continue POC.   Speech Therapy Frequency 2x / week   Duration 4 weeks   Treatment/Interventions Aspiration precaution training;Pharyngeal strengthening exercises;Diet toleration management by SLP;Compensatory techniques;Trials of upgraded texture/liquids;Cueing hierarchy;SLP instruction and feedback;Compensatory strategies;Patient/family education   Potential to Achieve Goals Fair   Potential Considerations Severity of impairments   SLP Home Exercise Plan Pt will be independent with HEP as assigned to facilitate carryover of treatment strategies in home environment to decrease risk of aspiration PNA   Consulted and Agree with Plan of Care Patient      Patient will benefit from skilled therapeutic intervention in order to improve the following deficits and impairments:   Dysphagia, oropharyngeal phase    Problem List Patient Active Problem List    Diagnosis Date Noted  . Primary localized osteoarthrosis of right shoulder 06/21/2015  . S/P shoulder replacement 06/21/2015  . Dysphagia, idiopathic 11/29/2014  . Delirium, drug-induced ([place]) 07/29/2014  . Atelectasis 07/29/2014  . Osteoarthritis of right knee 07/27/2014  . Knee osteoarthritis 07/27/2014  . Postsurgical hypothyroidism 04/22/2014  . Unspecified hypothyroidism 10/12/2013  . Syncope and collapse   . Anemia, normocytic normochromic 04/26/2012  . Gastroesophageal reflux disease   . Lymphocytic thyroiditis   . Hypertension 12/07/2011  . History of TIAs 12/07/2011  . Leg pain, bilateral 04/27/2011  . Hyperlipidemia 04/27/2011   Thank you,  Genene Churn, [place]  [facility] 06/12/2016, 3:28 PM  [facility] [address] [place], [place], [address] Phone: [phone]   Fax:  [phone]   Name: Christine Leblanc MRN: [id number] Date of Birth: Oct 28, 1929

## 2016-06-13 ENCOUNTER — Ambulatory Visit (HOSPITAL_COMMUNITY): Payer: Medicare Other | Admitting: Speech Pathology

## 2016-06-14 ENCOUNTER — Ambulatory Visit (HOSPITAL_COMMUNITY): Payer: Medicare Other | Admitting: Speech Pathology

## 2016-06-15 DIAGNOSIS — Z681 Body mass index (BMI) 19 or less, adult: Secondary | ICD-10-CM | POA: Diagnosis not present

## 2016-06-15 DIAGNOSIS — T148 Other injury of unspecified body region: Secondary | ICD-10-CM | POA: Diagnosis not present

## 2016-06-15 DIAGNOSIS — M545 Low back pain: Secondary | ICD-10-CM | POA: Diagnosis not present

## 2016-06-18 ENCOUNTER — Ambulatory Visit (HOSPITAL_COMMUNITY): Payer: Medicare Other | Admitting: Speech Pathology

## 2016-06-19 ENCOUNTER — Encounter: Payer: Self-pay | Admitting: Neurology

## 2016-06-19 ENCOUNTER — Ambulatory Visit (HOSPITAL_COMMUNITY): Payer: Medicare Other | Admitting: Speech Pathology

## 2016-06-19 ENCOUNTER — Ambulatory Visit (INDEPENDENT_AMBULATORY_CARE_PROVIDER_SITE_OTHER): Payer: Medicare Other | Admitting: Neurology

## 2016-06-19 VITALS — BP 138/71 | HR 78 | Ht 62.0 in | Wt 116.6 lb

## 2016-06-19 DIAGNOSIS — R1310 Dysphagia, unspecified: Secondary | ICD-10-CM

## 2016-06-19 DIAGNOSIS — R471 Dysarthria and anarthria: Secondary | ICD-10-CM

## 2016-06-19 NOTE — Progress Notes (Addendum)
[facility]    Provider:  Dr Jaynee Eagles Referring Provider: Barney Drain Primary Care Physician:  Barney Drain  CC:  Dysphagia, swallowing problems and left sided weakness  HPI:  Christine Leblanc is a 82 y.o. female here as a referral from Dr. Ramond Marrow . PMHx of dysphagia, former smoker, total thyroidectomy in 2010, distal esophageal web requiring dilation. Thin liquids are symptomatic. She has Dysphagia requiring mechanical soft nectar thick liquids. Swallowing problems started 40 years ago, but significant worsening more than 10 years ago she remembers being at her kitchen sink eating smoked sausage and choking. But she hadn't had a problem for decades until  having dentures put in which impairs chewing. She feels her dentures make it difficult to talk. Dysphasia worsening since Christmas. Patient thinks she may have had a stroke in the past. Started at Christmas or longer. Chewing is difficult and it takes her forever to chew since her dentures, she avoids things that are hard to chew. Occ she thinks she swallows something wrong. Main problems is liquids. She has a drippy nose. Sometimes coffee comes out her nose. 20 years ago she thinks maybe she had a stroke, she was at church and she was saying something and she suddenly felt weak and she felt clammy. She went to the doctor and  she thinks she had a stroke but the doctors were not sure. She has never noticed any weakness. The swallowing difficulty is continuous, not dependent on the time of day. If she is upset she is not so careful. No FHx of neuromuscular disorders. No tremor. No diplopia. No ptosis, no headache. She does not notice any muscle jumping and no recent weight or muscle loss. No changes in voice quality. She has a lot of joint problems, Dr. Mardelle Matte replaced shoulder and knee joints. No neck pain. Grandfather had Parkinson's disease. She denies any memory changes. No problems with breathing. No difficulty with  walking. No other new symptoms since December.  Reviewed notes, labs and imaging from outside physicians, which showed: TSH normal 4.47 February 2017. CBC in June 2016 showed anemia 9.4 with global and 27.9 hematocrit in 132 platelets, BMP was unremarkable.  CT of the head 11/2011: Personally reviewed and agree with the following  Findings: Age appropriate atrophy. Patchy hypodensity in the cerebral white matter bilaterally is unchanged from the prior study and compatible with chronic microvascular ischemia. No definite acute infarct. Negative for hemorrhage or mass lesion. Calvarium is intact.  Chronic sinusitis.  IMPRESSION: Atrophy and chronic microvascular ischemic change. No acute infarct or hemorrhage.  Review of Systems: Patient complains of symptoms per HPI as well as the following symptoms: fatigue, cough, hearing loss, trouble swallowing, cramps, allergies, runny nose, restless legs, decreased energy. Pertinent negatives per HPI. All others negative.   Social History   Social History  . Marital Status: Widowed    Spouse Name: N/A  . Number of Children: 4  . Years of Education: 17   Occupational History  . 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 Retired     Marine scientist   Social History Main Topics  . Smoking status: Former Smoker -- 0.80 packs/day for 20 years    Types: Cigarettes    Quit date: 12/31/1978  . Smokeless tobacco: Never Used  . Alcohol Use: No  . Drug Use: No  . Sexual Activity: Not on file   Other Topics Concern  . Not on file   Social History Narrative  Lives with daughter   Caffeine use: 1-2 cups coffee per day       Family History  Problem Relation Age of Onset  . Heart disease Mother   . Gallbladder disease Mother   . Breast cancer Sister   . Colon cancer Neg Hx   . Stroke Neg Hx     Past Medical History  Diagnosis Date  . Syncope and collapse 2007    Possible CVA in 2012 with left lower extremity weakness; refused  hospitalization; CT-Atrophy and chronic microvascular ischemic change.   . Asthma   . Gastroesophageal reflux disease     Hiatal hernia; distal esophageal web requiring dilatation; gastric polyps; gastritis; refuses colonoscopy  . Lymphocytic thyroiditis   . Multiple thyroid nodules 2010    Adenomatous; thyroidectomy in 2010  . Hyperlipidemia 04/27/2011  . Anemia   . GERD (gastroesophageal reflux disease)   . Chronic neck pain   . Chronic back pain   . Pneumonia     hosp. for pneumonia- long time ago   . Hypothyroidism   . Seizures ([place])     yes- as a child- & into adult years, states she took med. for them at one time, stopped at 32 yrs. of age   . Degenerative joint disease     Left shoulder; cervical spine, knees & hands   . Cancer ([place])     skin Ca- ? basal cell   . History of stress test 1990's    stress test done under the care of Dr. Lattie Haw & Dr. Gwenlyn Found, now being followed by Dr. Harl Bowie- in [place] , recently seen & told to f/U in one yr.   . Hypertension   . Osteoarthritis of right knee 07/27/2014  . Primary localized osteoarthrosis of right shoulder 06/21/2015    Past Surgical History  Procedure Laterality Date  . Abdominal hysterectomy      fibroids  . Prolapsed uterine fibroid ligation      outcomed with rectocele & cystocele  . Total thyroidectomy  2010  . Esophagogastroduodenoscopy  12/10    Dr. 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 Cataract extraction      Bilateral; redo surgery on the right for incomplete primary procedure  . Colonoscopy  Remote  . Shoulder surgery      left  . Total knee arthroplasty Right 07/27/2014    Procedure: RIGHT TOTAL KNEE ARTHROPLASTY;  Surgeon: Johnny Bridge, MD;  Location: [facility];  Service: Orthopedics;  Laterality: Right;  . Esophagogastroduodenoscopy N/A 03/21/2015    Procedure: ESOPHAGOGASTRODUODENOSCOPY (EGD);  Surgeon: Danie Binder, MD;  [facility];   Service: Endoscopy;  Laterality: N/A;  1130   . Savory dilation N/A 03/21/2015    Procedure: SAVORY DILATION;  Surgeon: Danie Binder, MD;  [facility];  Service: Endoscopy;  Laterality: N/A;  . Eye surgery    . Total shoulder arthroplasty Right 06/21/2015    Procedure: RIGHT TOTAL SHOULDER ARTHROPLASTY;  Surgeon: Marchia Bond, MD;  Location: [facility];  Service: Orthopedics;  Laterality: Right;    Current Outpatient Prescriptions  Medication Sig Dispense Refill  . albuterol (PROVENTIL HFA) 108 (90 BASE) MCG/ACT inhaler Inhale 1-2 puffs into the lungs every 6 (six) hours as needed for wheezing or shortness of breath. Wheezing, Asthma Symptoms    . amLODipine (NORVASC) 2.5 MG tablet Take 2.5 mg by mouth daily.    . Ascorbic Acid (VITAMIN C PO) Take 1 tablet by  mouth daily.    Marland Kitchen aspirin 81 MG tablet Take 81 mg by mouth daily.    . calcium-vitamin D (OSCAL WITH D) 500-200 MG-UNIT per tablet Take 1 tablet by mouth 2 (two) times daily.     . diazepam (VALIUM) 5 MG tablet Take one tablet by mouth at bedtime as needed for sleep (Patient taking differently: Take 2.5-5 mg by mouth at bedtime as needed (sleep). ) 30 tablet 5  . FLUZONE HIGH-DOSE 0.5 ML SUSY inject 0.5 milliliter intramuscularly  0  . Glucosamine HCl (GLUCOSAMINE PO) Take 1 tablet by mouth 2 (two) times daily.    Marland Kitchen levocetirizine (XYZAL) 5 MG tablet Take 5 mg by mouth every evening.      Marland Kitchen levothyroxine (SYNTHROID, LEVOTHROID) 100 MCG tablet TAKE 1 TABLET DAILY 90 tablet 1  . montelukast (SINGULAIR) 10 MG tablet Take 10 mg by mouth at bedtime.    . Omega-3 Fatty Acids (FISH OIL) 1200 MG CAPS Take 1 capsule by mouth daily.     Marland Kitchen omeprazole (PRILOSEC) 20 MG capsule 1 PO 30 mins prior to breakfast and supper 180 capsule 3  . pravastatin (PRAVACHOL) 80 MG tablet Take 1 tablet (80 mg total) by mouth every evening. 90 tablet 2  . pyridOXINE (VITAMIN B-6) 100 MG tablet Take 100 mg by mouth at bedtime.    Orlie Dakin Sodium  (PERI-COLACE PO) Take 1 tablet by mouth 2 (two) times daily.     . vitamin B-12 (CYANOCOBALAMIN) 1000 MCG tablet Take 1,000 mcg by mouth every morning.    . vitamin E 400 UNIT capsule Take 400 Units by mouth at bedtime.      No current facility-administered medications for this visit.    Allergies as of 06/19/2016 - Review Complete 06/19/2016  Allergen Reaction Noted  . Celecoxib Shortness Of Breath   . Dexilant [dexlansoprazole] Anaphylaxis 05/04/2016    Vitals: BP 138/71 mmHg  Pulse 78  Ht 5\' 2"  (1.575 m)  Wt 116 lb 9.6 oz (52.889 kg)  BMI 21.32 kg/m2 Last Weight:  Wt Readings from Last 1 Encounters:  06/19/16 116 lb 9.6 oz (52.889 kg)   Last Height:   Ht Readings from Last 1 Encounters:  06/19/16 5\' 2"  (1.575 m)   Physical exam: Exam: Gen: NAD, very conversant, thin, atrophy distally               CV: RRR, no MRG. No Carotid Bruits. No peripheral edema, warm, nontender Eyes: Conjunctivae clear without exudates or hemorrhage  Neuro: Detailed Neurologic Exam  Speech:    Speech is dysarthric; fluent and spontaneous with normal comprehension.  Cognition:    The patient is oriented to person, place, and time;     recent and remote memory intact;     language fluent;     normal attention, concentration,     fund of knowledge  MMSE - Mini Mental State Exam 06/19/2016  Orientation to time 5  Orientation to Place 5  Registration 3  Attention/ Calculation 5  Recall 3  Language- name 2 objects 2  Language- repeat 1  Language- follow 3 step command 3  Language- read & follow direction 1  Write a sentence 1  Copy design 1  Total score 30   Cranial Nerves:    The pupils are equal, round, and reactive to light. Attempted funduscopic exam but could not visualize. Visual fields are full to finger confrontation. Extraocular movements are intact. Trigeminal sensation is intact and the muscles of mastication are normal.  The face is symmetric. The palate elevates in the  midline. Hearing intact. Voice is normal. Shoulder shrug is normal. The tongue has normal motion without fasciculations.  Good mouth closure strength but unable to puff out cheeks, good eye closure strength.   Coordination:    Normal finger to nose and heel to shin.   Gait:    Mildly stooped, can walk on heels and toes if you hold her hands, good arm swing, slow but not ataxic gait.   Motor Observation:    No asymmetry, has generalized atrophy, and no involuntary movements noted. Tone:    Normal muscle tone.    Posture:    Posture is normal. normal erect    Strength:4/5 deltoid and triceps weakness bilaterally, intact biceps, weakness of the intrinsic hand muscles with distal wasting, 4/5 bilat hip flexion weakness otherwise intact.      Sensation: intact to LT     Reflex Exam:  DTR's: Absent AJs otherwise deep tendon reflexes in the upper and lower extremities are brisk  bilaterally.   Toes:    The toes are downgoing bilaterally.   Clonus:    Clonus is absent.    Assessment/Plan:  82 y.o. female here as a referral from Dr. Gerarda Fraction for dysphagia . PMHx of dysphagia, former smoker, total thyroidectomy in 2010, distal esophageal web requiring dilation. No suggestion, in the history or on exam of Parkinson's disease, myasthenia gravis which are neurologic conditions that can cause dysphagia. Bulbar onset ALS can;t be ruled out but wouldn't expect this long course of over 10 years if this was ALS. Mini-Mental status exam was 30 out of 30 so less likely dementia. No asymmetric findings on strength or sensory exam but cerebrovascular events can cause dysarthria with dysphasia so we'll check an MRI of the brain for strokes or other central etiologies. Sarina Ill, MD  [facility] [address] [place],  [address]  Phone [phone] Fax [phone]

## 2016-06-19 NOTE — Patient Instructions (Signed)
Remember to drink plenty of fluid, eat healthy meals and do not skip any meals. Try to eat protein with a every meal and eat a healthy snack such as fruit or nuts in between meals. Try to keep a regular sleep-wake schedule and try to exercise daily, particularly in the form of walking, 20-30 minutes a day, if you can.   As far as diagnostic testing: MRI of the brain  I would like to see you back in 3 months, sooner if we need to. Please call us with any interim questions, concerns, problems, updates or refill requests.   Our phone number is [phone]. We also have an after hours call service for urgent matters and there is a physician on-call for urgent questions. For any emergencies you know to call 911 or go to the nearest emergency room

## 2016-06-20 ENCOUNTER — Encounter: Payer: Self-pay | Admitting: Neurology

## 2016-06-20 ENCOUNTER — Ambulatory Visit (HOSPITAL_COMMUNITY): Payer: Medicare Other | Admitting: Speech Pathology

## 2016-06-21 ENCOUNTER — Ambulatory Visit (HOSPITAL_COMMUNITY): Payer: Medicare Other | Admitting: Speech Pathology

## 2016-06-25 ENCOUNTER — Ambulatory Visit (HOSPITAL_COMMUNITY): Payer: Medicare Other | Admitting: Speech Pathology

## 2016-06-25 ENCOUNTER — Telehealth: Payer: Self-pay | Admitting: Gastroenterology

## 2016-06-25 NOTE — Telephone Encounter (Signed)
Patient called this afternoon asking if SF was here today. I told her that SF was at the hospital this week. She said that she is having green BMs and should she be concerned. Please advise and call her at [phone]

## 2016-06-25 NOTE — Telephone Encounter (Signed)
I called pt and she said she has about 3 BM's daily, but usually the last one is just a very small amount. She has had green beans, but stools have been greenish for about a week now. I told her it is nothing to be worried about,  It is not uncommon, probably due to bile pigment in the stool. I told her I will send a note to Walden Field, NP for any recommendations or comments.

## 2016-06-25 NOTE — Telephone Encounter (Signed)
No further recommendations at this time. Let us know if anything changes, worsens (specifically if starts having more frequent stools or bloody stools.)

## 2016-06-25 NOTE — Telephone Encounter (Signed)
Pt is aware.  

## 2016-06-26 ENCOUNTER — Ambulatory Visit (HOSPITAL_COMMUNITY): Payer: Medicare Other | Admitting: Speech Pathology

## 2016-06-27 ENCOUNTER — Ambulatory Visit (HOSPITAL_COMMUNITY): Payer: Medicare Other | Admitting: Speech Pathology

## 2016-06-27 ENCOUNTER — Encounter: Payer: Self-pay | Admitting: Gastroenterology

## 2016-06-28 ENCOUNTER — Telehealth: Payer: Self-pay | Admitting: Neurology

## 2016-06-28 ENCOUNTER — Ambulatory Visit (HOSPITAL_COMMUNITY): Payer: Medicare Other | Admitting: Speech Pathology

## 2016-06-28 NOTE — Telephone Encounter (Signed)
Called pt. Advised per Dr Jaynee Eagles that MRI brain only primarily looks at the brain, not the nose/mouth. Dr Jaynee Eagles also spoke to pt on the phone. She explained we are looking from a neurological standpoint and we evaluate the brain to look for stroke for instance. We are looking for other causes of her sx.

## 2016-06-28 NOTE — Telephone Encounter (Signed)
Pt called in about MRI. She is wondering if the MRI of her brain will include her nose and mouth. Please call

## 2016-06-28 NOTE — Telephone Encounter (Signed)
Tried calling pt back. Phone kept ringing. Unable to LVM.  Will try again later

## 2016-07-04 ENCOUNTER — Other Ambulatory Visit: Payer: Medicare Other

## 2016-07-04 DIAGNOSIS — R1313 Dysphagia, pharyngeal phase: Secondary | ICD-10-CM | POA: Diagnosis not present

## 2016-07-04 DIAGNOSIS — Z681 Body mass index (BMI) 19 or less, adult: Secondary | ICD-10-CM | POA: Diagnosis not present

## 2016-07-04 DIAGNOSIS — R634 Abnormal weight loss: Secondary | ICD-10-CM | POA: Diagnosis not present

## 2016-07-04 DIAGNOSIS — J018 Other acute sinusitis: Secondary | ICD-10-CM | POA: Diagnosis not present

## 2016-07-04 DIAGNOSIS — E782 Mixed hyperlipidemia: Secondary | ICD-10-CM | POA: Diagnosis not present

## 2016-07-04 DIAGNOSIS — J209 Acute bronchitis, unspecified: Secondary | ICD-10-CM | POA: Diagnosis not present

## 2016-07-04 DIAGNOSIS — M1991 Primary osteoarthritis, unspecified site: Secondary | ICD-10-CM | POA: Diagnosis not present

## 2016-07-04 DIAGNOSIS — J309 Allergic rhinitis, unspecified: Secondary | ICD-10-CM | POA: Diagnosis not present

## 2016-07-04 DIAGNOSIS — E538 Deficiency of other specified B group vitamins: Secondary | ICD-10-CM | POA: Diagnosis not present

## 2016-07-04 DIAGNOSIS — E063 Autoimmune thyroiditis: Secondary | ICD-10-CM | POA: Diagnosis not present

## 2016-07-04 DIAGNOSIS — Z1389 Encounter for screening for other disorder: Secondary | ICD-10-CM | POA: Diagnosis not present

## 2016-07-04 DIAGNOSIS — E559 Vitamin D deficiency, unspecified: Secondary | ICD-10-CM | POA: Diagnosis not present

## 2016-07-04 DIAGNOSIS — I10 Essential (primary) hypertension: Secondary | ICD-10-CM | POA: Diagnosis not present

## 2016-07-05 ENCOUNTER — Ambulatory Visit (HOSPITAL_COMMUNITY): Payer: Self-pay | Admitting: Speech Pathology

## 2016-07-09 ENCOUNTER — Ambulatory Visit
Admission: RE | Admit: 2016-07-09 | Discharge: 2016-07-09 | Disposition: A | Discharge status: Home / Self Care | Payer: Medicare Other | Source: Ambulatory Visit | Attending: Neurology | Admitting: Neurology

## 2016-07-09 DIAGNOSIS — R471 Dysarthria and anarthria: Secondary | ICD-10-CM

## 2016-07-09 DIAGNOSIS — R1310 Dysphagia, unspecified: Secondary | ICD-10-CM | POA: Diagnosis not present

## 2016-07-16 ENCOUNTER — Telehealth: Payer: Self-pay | Admitting: *Deleted

## 2016-07-16 DIAGNOSIS — Z79899 Other long term (current) drug therapy: Secondary | ICD-10-CM | POA: Diagnosis not present

## 2016-07-16 NOTE — Telephone Encounter (Signed)
Per Dr Jaynee Eagles, spoke with patient and advised her MRI of the brain is not significantly different that CT of the head in 2013. No new strokes or lesions to explain her dysphagia that started this past Christmas. She verbalized understanding, appreciation for call.

## 2016-07-24 ENCOUNTER — Encounter (HOSPITAL_COMMUNITY): Payer: Self-pay | Admitting: Speech Pathology

## 2016-07-24 ENCOUNTER — Ambulatory Visit (HOSPITAL_COMMUNITY): Payer: Medicare Other | Attending: Gastroenterology | Admitting: Speech Pathology

## 2016-07-24 DIAGNOSIS — R1312 Dysphagia, oropharyngeal phase: Secondary | ICD-10-CM | POA: Insufficient documentation

## 2016-07-24 NOTE — Therapy (Signed)
Christine Leblanc, [place] Lebl[place], [address] Phone: [phone]   Fax:  [phone]  Speech Language Pathology Treatment  Patient Details  Name: Christine Leblanc MRN: [id number] Date of Birth: [birth year] No Data Recorded  Encounter Date: 07/24/2016      End of Session - 07/24/16 1819    Visit Number 5   Number of Visits 8   Date for SLP Re-Evaluation 09/19/16   Authorization Type Medicare   SLP Start Time 1350   SLP Stop Time  1430   SLP Time Calculation (min) 40 min   Activity Tolerance Patient tolerated treatment well      Past Medical History:  Diagnosis Date  . Anemia   . Asthma   . Cancer ([place])    skin Ca- ? basal cell   . Chronic back pain   . Chronic neck pain   . Degenerative joint disease    Left shoulder; cervical spine, knees & hands   . Gastroesophageal reflux disease    Hiatal hernia; distal esophageal web requiring dilatation; gastric polyps; gastritis; refuses colonoscopy  . GERD (gastroesophageal reflux disease)   . History of stress test 1990's   stress test done under the care of Dr. Lattie Haw & Dr. Gwenlyn Found, now being followed by Dr. Harl Bowie- in [place] , recently seen & told to f/U in one yr.   . Hyperlipidemia 04/27/2011  . Hypertension   . Hypothyroidism   . Lymphocytic thyroiditis   . Multiple thyroid nodules 2010   Adenomatous; thyroidectomy in 2010  . Osteoarthritis of right knee 07/27/2014  . Pneumonia    hosp. for pneumonia- long time ago   . Primary localized osteoarthrosis of right shoulder 06/21/2015  . Seizures ([place])    yes- as a child- & into adult years, states she took med. for them at one time, stopped at 32 yrs. of age   . Syncope and collapse 2007   Possible CVA in 2012 with left lower extremity weakness; refused hospitalization; CT-Atrophy and chronic microvascular ischemic change.     Past Surgical History:  Procedure Laterality Date  . ABDOMINAL HYSTERECTOMY     fibroids   . CATARACT EXTRACTION     Bilateral; redo surgery on the right for incomplete primary procedure  . COLONOSCOPY  Remote  . ESOPHAGOGASTRODUODENOSCOPY  12/10   Dr. 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 ESOPHAGOGASTRODUODENOSCOPY N/A 03/21/2015   Procedure: ESOPHAGOGASTRODUODENOSCOPY (EGD);  Surgeon: Danie Binder, MD;  [facility];  Service: Endoscopy;  Laterality: N/A;  1130   . EYE SURGERY    . PROLAPSED UTERINE FIBROID LIGATION     outcomed with rectocele & cystocele  . SAVORY DILATION N/A 03/21/2015   Procedure: SAVORY DILATION;  Surgeon: Danie Binder, MD;  [facility];  Service: Endoscopy;  Laterality: N/A;  . SHOULDER SURGERY     left  . TOTAL KNEE ARTHROPLASTY Right 07/27/2014   Procedure: RIGHT TOTAL KNEE ARTHROPLASTY;  Surgeon: Johnny Bridge, MD;  Location: [facility];  Service: Orthopedics;  Laterality: Right;  . TOTAL SHOULDER ARTHROPLASTY Right 06/21/2015   Procedure: RIGHT TOTAL SHOULDER ARTHROPLASTY;  Surgeon: Marchia Bond, MD;  Location: [facility];  Service: Orthopedics;  Laterality: Right;  . TOTAL THYROIDECTOMY  2010    There were no vitals filed for this visit.      Subjective Assessment - 07/24/16 1815    Subjective "Sometimes I don't remember to turn my  head when I swallow."   Currently in Pain? No/denies               ADULT SLP TREATMENT - 07/24/16 1816      General Information   Behavior/Cognition Alert;Cooperative;Pleasant mood   Patient Positioning Upright in chair   Oral care provided N/A   HPI Christine Leblanc is an 82 yo woman who was referred for MBSS by Dr. Oneida Alar due to reports of dysphagia with liquids occasionally coming out of her nose.Marland Kitchen Pt is a former smoker (quit 37 years ago), had total thyroidectomy in 2010, and had EGD with savory dilation 03/21/2015. She lives at home with her daughter (who has seizures and needs assist). She denies recent bouts of PNA, but is  followed by Dr. Harold Hedge for allergies and asthma and Dr. Benjamine Mola for hearing loss in right ear. Pt has history of GERD, hiatal hernia, distal esophageal web requiring dilation, gastric polyps, and gastritis. She is a former Therapist, sports.      Treatment Provided   Treatment provided Dysphagia     Dysphagia Treatment   Temperature Spikes Noted No   Respiratory Status Room air   Oral Cavity - Dentition Adequate natural dentition   Treatment Methods Therapeutic exercise;Compensation strategy training;Patient/caregiver education   Patient observed directly with PO's Yes   Type of PO's observed Thin liquids   Feeding Able to feed self   Liquids provided via Cup   Pharyngeal Phase Signs & Symptoms Multiple swallows   Type of cueing Verbal   Amount of cueing Modified independent     Pain Assessment   Pain Assessment No/denies pain     Assessment / Recommendations / Plan   Plan Continue with current plan of care     Dysphagia Recommendations   Diet recommendations Dysphagia 3 (mechanical soft);Thin liquid  Mare Ferrari water protocol   Liquids provided via Cup   Medication Administration Whole meds with puree   Supervision Patient able to self feed   Compensations Slow rate;Small sips/bites;Multiple dry swallows after each bite/sip;Effortful swallow   Postural Changes and/or Swallow Maneuvers Head turn left during swallow;Seated upright 90 degrees;Upright 30-60 min after meal;Out of bed for meals     General Recommendations   Oral Care Recommendations Oral care BID   Follow up Recommendations Outpatient SLP     Progression Toward Goals   Progression toward goals Progressing toward goals          SLP Education - 07/24/16 1818    Education provided Yes   Education Details Continue with exercises and strategies (chin tuck and head turn to left); Implement Frazier water protocol (ok for thin water only between meals and after oral care)   Person(s) Educated Patient   Methods Explanation;Handout    Comprehension Verbalized understanding          SLP Short Term Goals - 07/24/16 1822      SLP SHORT TERM GOAL #1   Title Pt will complete oropharyngeal exercises as assigned 3x/day with use of written cue.   Baseline No exercises at this point   Time 6   Period Weeks   Status On-going     SLP SHORT TERM GOAL #2   Title Pt will be evaluated by ENT to determine if vocal function exercises are indicated to reduce risk of aspiration and increase vocal quality.   Baseline    Time 6   Period Weeks   Status Pt seen by Dr. Benjamine Mola, SLP requested records  SLP SHORT TERM GOAL #3   Title Pt will demonstrate safe and efficient consumption of D3/mech soft diet with NTL with use of compensatory strategies (bolus hold, head turn to LEFT with chin tuck) and min assist   Baseline Introduced today with written cues provided- max assist   Time 6   Period Weeks   Status On-going          SLP Long Term Goals - 07/24/16 1822      SLP LONG TERM GOAL #1   Title Same as short          Plan - 07/24/16 1822    Clinical Impression Statement Christine Leblanc was seen for ongoing dysphagia intervention. She canceled several appointments due to assisting with her daughter's care. She continues to use thickener at home for all liquids, but has implemented [employer] as well with success. Pt reports continued weight loss. Exercises completed this date with verbal cues. Pt encouraged to continue with exercises (she is no longer having liquids come out her nose) with reassessment via instrumental test (MBSS) in early September to see if liquids can be liberalized. Pt sees Dr. Oneida Alar August 24 and Dr. Jaynee Eagles Sept 20. Will request one more treatment before MBSS.   Speech Therapy Frequency 2x / week   Duration 4 weeks   Treatment/Interventions Aspiration precaution training;Pharyngeal strengthening exercises;Diet toleration management by SLP;Compensatory techniques;Trials of upgraded  texture/liquids;Cueing hierarchy;SLP instruction and feedback;Compensatory strategies;Patient/family education   Potential to Achieve Goals Fair   Potential Considerations Severity of impairments   SLP Home Exercise Plan Pt will be independent with HEP as assigned to facilitate carryover of treatment strategies in home environment to decrease risk of aspiration PNA   Consulted and Agree with Plan of Care Patient      Patient will benefit from skilled therapeutic intervention in order to improve the following deficits and impairments:   Dysphagia, oropharyngeal phase    Problem List Patient Active Problem List   Diagnosis Date Noted  . Primary localized osteoarthrosis of right shoulder 06/21/2015  . S/P shoulder replacement 06/21/2015  . Dysphagia, idiopathic 11/29/2014  . 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 Atelectasis 07/29/2014  . Osteoarthritis of right knee 07/27/2014  . Knee osteoarthritis 07/27/2014  . Postsurgical hypothyroidism 04/22/2014  . Unspecified hypothyroidism 10/12/2013  . Syncope and collapse   . Anemia, normocytic normochromic 04/26/2012  . Gastroesophageal reflux disease   . Lymphocytic thyroiditis   . Hypertension 12/07/2011  . History of TIAs 12/07/2011  . Leg pain, bilateral 04/27/2011  . Hyperlipidemia 04/27/2011   Thank you,  Genene Churn, [place]  [facility] 07/24/2016, 6:23 PM  [facility] [address] [place], Christine Lebl[place], [address] Phone: [phone]   Fax:  [phone]   Name: Christine Leblanc MRN: [id number] Date of Birth: 02-14-1929

## 2016-07-30 ENCOUNTER — Encounter: Payer: Self-pay | Admitting: Endocrinology

## 2016-07-30 ENCOUNTER — Ambulatory Visit (INDEPENDENT_AMBULATORY_CARE_PROVIDER_SITE_OTHER): Payer: Medicare Other | Admitting: Endocrinology

## 2016-07-30 VITALS — BP 112/70 | HR 92 | Ht 63.0 in | Wt 111.0 lb

## 2016-07-30 DIAGNOSIS — E890 Postprocedural hypothyroidism: Secondary | ICD-10-CM | POA: Diagnosis not present

## 2016-07-30 LAB — T4, FREE: FREE T4: 1.76 ng/dL — AB (ref 0.60–1.60)

## 2016-07-30 LAB — TSH: TSH: 0.03 u[IU]/mL — ABNORMAL LOW (ref 0.35–4.50)

## 2016-07-30 NOTE — Progress Notes (Signed)
Please let patient know that the thyroid is very high, start Synthroid 125ug, 1/2 tab qd, see me in 1 month same day labs

## 2016-07-30 NOTE — Progress Notes (Signed)
Christine Leblanc 82 y.o.    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   She has been on 100 mcg since 03/2014 She was last seen in 2/17  Recently she was being evaluated by her PCP for weight loss and was found to have a low TSH level of 0.047 done on 07/04/16 Free T4 not available She does feel tired at times.  No complaints of palpitations or excessive feet. She says her appetite is variable but she is losing weight from difficulty swallowing          Compliance with the medical regimen has been as prescribed with taking the tablet in the morning before breakfast.    Lab Results  Component Value Date   TSH 4.470 02/08/2016   TSH 2.912 01/28/2015   TSH 2.546 07/21/2014        Medication List       Accurate as of 07/30/16  3:29 PM. Always use your most recent med list.          amLODipine 2.5 MG tablet Commonly known as:  NORVASC Take 2.5 mg by mouth daily.   aspirin 81 MG tablet Take 81 mg by mouth daily.   calcium-vitamin D 500-200 MG-UNIT tablet Commonly known as:  OSCAL WITH D Take 1 tablet by mouth 2 (two) times daily.   diazepam 5 MG tablet Commonly known as:  VALIUM Take one tablet by mouth at bedtime as needed for sleep   Fish Oil 1200 MG Caps Take 1 capsule by mouth daily.   FLUZONE HIGH-DOSE 0.5 ML Susy Generic drug:  Influenza Vac Split High-Dose inject 0.5 milliliter intramuscularly   GLUCOSAMINE PO Take 1 tablet by mouth 2 (two) times daily.   levocetirizine 5 MG tablet Commonly known as:  XYZAL Take 5 mg by mouth every evening.    levothyroxine 100 MCG tablet Commonly known as:  SYNTHROID, LEVOTHROID TAKE 1 TABLET DAILY   montelukast 10 MG tablet Commonly known as:  SINGULAIR Take 10 mg by mouth at bedtime.   omeprazole 20 MG capsule Commonly known as:  PRILOSEC 1 PO 30 mins prior to breakfast and supper   PERI-COLACE PO Take 1 tablet by mouth 2 (two) times daily.   pravastatin 80 MG tablet Commonly known as:  PRAVACHOL Take 1 tablet (80 mg total) by mouth every evening.   PROVENTIL HFA 108 (90 Base) MCG/ACT inhaler Generic drug:  albuterol Inhale 1-2 puffs into the lungs every 6 (six) hours as needed for wheezing or shortness of breath. Wheezing, Asthma Symptoms   pyridOXINE 100 MG tablet Commonly known as:  VITAMIN B-6 Take 100 mg by mouth at bedtime.   vitamin B-12 1000 MCG tablet Commonly known as:  CYANOCOBALAMIN Take 1,000 mcg by mouth every morning.   VITAMIN C PO Take 1 tablet by mouth daily.   vitamin E 400 UNIT capsule Take 400 Units by mouth at bedtime.       Allergies:  Allergies  Allergen Reactions  . Celecoxib Shortness Of Breath  . Dexilant [Dexlansoprazole] Anaphylaxis    abd pain    Past Medical  History:  Diagnosis Date  . Anemia   . Asthma   . Cancer ([place])    skin Ca- ? basal cell   . Chronic back pain   . Chronic neck pain   . Degenerative joint disease    Left shoulder; cervical spine, knees & hands   . Gastroesophageal reflux disease    Hiatal hernia; distal esophageal web requiring dilatation; gastric polyps; gastritis; refuses colonoscopy  . GERD (gastroesophageal reflux disease)   . History of stress test 1990's   stress test done under the care of Dr. Lattie Haw & Dr. Gwenlyn Found, now being followed by Dr. Harl Bowie- in [place] , recently seen & told to f/U in one yr.   . Hyperlipidemia 04/27/2011  . Hypertension   . Hypothyroidism   . Lymphocytic thyroiditis   . Multiple thyroid nodules 2010   Adenomatous; thyroidectomy in 2010  . Osteoarthritis of right  knee 07/27/2014  . Pneumonia    hosp. for pneumonia- long time ago   . Primary localized osteoarthrosis of right shoulder 06/21/2015  . Seizures ([place])    yes- as a child- & into adult years, states she took med. for them at one time, stopped at 32 yrs. of age   . Syncope and collapse 2007   Possible CVA in 2012 with left lower extremity weakness; refused hospitalization; CT-Atrophy and chronic microvascular ischemic change.     Past Surgical History:  Procedure Laterality Date  . ABDOMINAL HYSTERECTOMY     fibroids  . CATARACT EXTRACTION     Bilateral; redo surgery on the right for incomplete primary procedure  . COLONOSCOPY  Remote  . ESOPHAGOGASTRODUODENOSCOPY  12/10   Dr. 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 ESOPHAGOGASTRODUODENOSCOPY N/A 03/21/2015   Procedure: ESOPHAGOGASTRODUODENOSCOPY (EGD);  Surgeon: Danie Binder, MD;  [facility];  Service: Endoscopy;  Laterality: N/A;  1130   . EYE SURGERY    . PROLAPSED UTERINE FIBROID LIGATION     outcomed with rectocele & cystocele  . SAVORY DILATION N/A 03/21/2015   Procedure: SAVORY DILATION;  Surgeon: Danie Binder, MD;  [facility];  Service: Endoscopy;  Laterality: N/A;  . SHOULDER SURGERY     left  . TOTAL KNEE ARTHROPLASTY Right 07/27/2014   Procedure: RIGHT TOTAL KNEE ARTHROPLASTY;  Surgeon: Johnny Bridge, MD;  Location: [facility];  Service: Orthopedics;  Laterality: Right;  . TOTAL SHOULDER ARTHROPLASTY Right 06/21/2015   Procedure: RIGHT TOTAL SHOULDER ARTHROPLASTY;  Surgeon: Marchia Bond, MD;  Location: [facility];  Service: Orthopedics;  Laterality: Right;  . TOTAL THYROIDECTOMY  2010    Family History  Problem Relation Age of Onset  . Heart disease Mother   . Gallbladder disease Mother   . Breast cancer Sister   . Colon cancer Neg Hx   . Stroke Neg Hx     Social History:  reports that she quit smoking about 37 years ago. Her smoking use  included Cigarettes. She has a 16.00 pack-year smoking history. She has never used smokeless tobacco. She reports that she does not drink alcohol or use drugs.  REVIEW Of SYSTEMS:  Review of Systems  Gastrointestinal:       Dysphagia +  Musculoskeletal: Positive for joint pain.   Weight History:   Wt Readings from Last 3 Encounters:  07/30/16 111 lb (50.3 kg)  06/19/16 116 lb 9.6 oz (52.9 kg)  05/01/16 123 lb 9.6 oz (56.1 kg)    History of  hypertension followed by PCP   Examination:   BP 112/70 (BP Location: Left Arm, Patient Position: Sitting, Cuff Size: Normal)   Pulse 92   Ht 5\' 3"  (1.6 m)   Wt 111 lb (50.3 kg)   SpO2 96%   BMI 19.66 kg/m   Does not appear anxious No tremor present Biceps reflexes appear normal    Assessments   Hypothyroidism, postsurgical with previously stable supplement those those of levothyroxine She has lost a significant amount of weight since the last visit, etiology unclear She has variable appetite and appears to be somewhat tachycardic today  Since her TSH is significantly low about 4 weeks ago will need to recheck this along with free T4 level to decide on her new dose   Taneshia Lorence 07/30/2016, 3:29 PM   Addendum: TSH suppressed and free T4 significantly high.  Will change her dose to 75 g and follow-up in one month  Lab Results  Component Value Date   TSH 0.03 (L) 07/30/2016   TSH 4.470 02/08/2016   TSH 2.912 01/28/2015   FREET4 1.76 (H) 07/30/2016   FREET4 1.55 02/08/2016   FREET4 1.49 01/28/2015

## 2016-07-31 MED ORDER — LEVOTHYROXINE SODIUM 75 MCG PO TABS: 75.0000 ug | ORAL_TABLET | Freq: Every day | ORAL | 3 refills | Status: DC

## 2016-08-01 DIAGNOSIS — Z79899 Other long term (current) drug therapy: Secondary | ICD-10-CM | POA: Diagnosis not present

## 2016-08-09 DIAGNOSIS — I10 Essential (primary) hypertension: Secondary | ICD-10-CM | POA: Diagnosis not present

## 2016-08-09 DIAGNOSIS — E063 Autoimmune thyroiditis: Secondary | ICD-10-CM | POA: Diagnosis not present

## 2016-08-09 DIAGNOSIS — Z6820 Body mass index (BMI) 20.0-20.9, adult: Secondary | ICD-10-CM | POA: Diagnosis not present

## 2016-08-09 DIAGNOSIS — F419 Anxiety disorder, unspecified: Secondary | ICD-10-CM | POA: Diagnosis not present

## 2016-08-10 ENCOUNTER — Telehealth: Payer: Self-pay

## 2016-08-10 NOTE — Telephone Encounter (Signed)
-----   Message from Elayne Snare, MD sent at 07/30/2016  9:46 PM EDT ----- Please let patient know that the thyroid is very high, start Synthroid 125ug, 1/2 tab qd, see me in 1 month same day labs

## 2016-08-10 NOTE — Telephone Encounter (Signed)
Letter mailed to pt with the results.

## 2016-08-14 ENCOUNTER — Encounter: Payer: Self-pay | Admitting: Cardiology

## 2016-08-17 ENCOUNTER — Other Ambulatory Visit (HOSPITAL_COMMUNITY): Payer: Self-pay | Admitting: Internal Medicine

## 2016-08-17 DIAGNOSIS — Z79899 Other long term (current) drug therapy: Secondary | ICD-10-CM

## 2016-08-21 DIAGNOSIS — J45909 Unspecified asthma, uncomplicated: Secondary | ICD-10-CM | POA: Diagnosis not present

## 2016-08-22 ENCOUNTER — Other Ambulatory Visit (HOSPITAL_COMMUNITY): Payer: Self-pay | Admitting: Internal Medicine

## 2016-08-22 DIAGNOSIS — J300 Vasomotor rhinitis: Secondary | ICD-10-CM | POA: Diagnosis not present

## 2016-08-22 DIAGNOSIS — J3089 Other allergic rhinitis: Secondary | ICD-10-CM | POA: Diagnosis not present

## 2016-08-22 DIAGNOSIS — K219 Gastro-esophageal reflux disease without esophagitis: Secondary | ICD-10-CM | POA: Diagnosis not present

## 2016-08-22 DIAGNOSIS — J4530 Mild persistent asthma, uncomplicated: Secondary | ICD-10-CM | POA: Diagnosis not present

## 2016-08-22 DIAGNOSIS — Z79899 Other long term (current) drug therapy: Secondary | ICD-10-CM

## 2016-08-23 ENCOUNTER — Ambulatory Visit (INDEPENDENT_AMBULATORY_CARE_PROVIDER_SITE_OTHER): Payer: Medicare Other | Admitting: Gastroenterology

## 2016-08-23 ENCOUNTER — Encounter: Payer: Self-pay | Admitting: Gastroenterology

## 2016-08-23 DIAGNOSIS — R1310 Dysphagia, unspecified: Secondary | ICD-10-CM

## 2016-08-23 DIAGNOSIS — K219 Gastro-esophageal reflux disease without esophagitis: Secondary | ICD-10-CM

## 2016-08-23 DIAGNOSIS — J3089 Other allergic rhinitis: Secondary | ICD-10-CM | POA: Diagnosis not present

## 2016-08-23 DIAGNOSIS — J301 Allergic rhinitis due to pollen: Secondary | ICD-10-CM | POA: Diagnosis not present

## 2016-08-23 NOTE — Progress Notes (Signed)
Subjective:    Patient ID: Christine Leblanc, female    DOB: 1929-04-06, 82 y.o.   MRN: [id number]  Glo Herring., MD  HPI Lost 11 lbs SINCE MAY 2017. THYROID DOSE WAS TOO HIGH. WEIGHT WAS DOWN TO 108 LBS. BEEN TRYING MORE FOODS. SAW SPEECH THERAPY AND GIVEN ADVICE FOR MEALS. HAS LACK OF ENERGY SINCE AGE 77. SWALLOWING BETTER FOR MOST PART. MAY FORGET HER TECHNIQUES AND SHE WILL COUGH. MAY WAKE UP AT NIGHT WITH THE SWEATS. BMs:  STRESS SHUTS HER DOWN. HAD FRIEND STOPPED BY ONE NIGHT. RARE UPPER ABDOMINAL PAIN(MILD).  PT DENIES FEVER, CHILLS, HEMATOCHEZIA, nausea, vomiting, melena, diarrhea, CHEST PAIN, SHORTNESS OF BREATH,  CHANGE IN BOWEL IN HABITS, OR heartburn or indigestion.   Past Medical History:  Diagnosis Date  . Anemia   . Asthma   . Cancer ([place])    skin Ca- ? basal cell   . Chronic back pain   . Chronic neck pain   . Degenerative joint disease    Left shoulder; cervical spine, knees & hands   . Gastroesophageal reflux disease    Hiatal hernia; distal esophageal web requiring dilatation; gastric polyps; gastritis; refuses colonoscopy  . GERD (gastroesophageal reflux disease)   . History of stress test 1990's   stress test done under the care of Dr. Lattie Haw & Dr. Gwenlyn Found, now being followed by Dr. Harl Bowie- in [place] , recently seen & told to f/U in one yr.   . Hyperlipidemia 04/27/2011  . Hypertension   . Hypothyroidism   . Lymphocytic thyroiditis   . Multiple thyroid nodules 2010   Adenomatous; thyroidectomy in 2010  . Osteoarthritis of right knee 07/27/2014  . Pneumonia    hosp. for pneumonia- long time ago   . Primary localized osteoarthrosis of right shoulder 06/21/2015  . Seizures ([place])    yes- as a child- & into adult years, states she took med. for them at one time, stopped at 32 yrs. of age   . Syncope and collapse 2007   Possible CVA in 2012 with left lower extremity weakness; refused hospitalization; CT-Atrophy and chronic microvascular ischemic change.      Past Surgical History:  Procedure Laterality Date  . ABDOMINAL HYSTERECTOMY     fibroids  . CATARACT EXTRACTION     Bilateral; redo surgery on the right for incomplete primary procedure  . COLONOSCOPY  Remote  . ESOPHAGOGASTRODUODENOSCOPY  12/10   Dr. 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 ESOPHAGOGASTRODUODENOSCOPY N/A 03/21/2015   Procedure: ESOPHAGOGASTRODUODENOSCOPY (EGD);  Surgeon: Danie Binder, MD;  [facility];  Service: Endoscopy;  Laterality: N/A;  1130   . EYE SURGERY    . PROLAPSED UTERINE FIBROID LIGATION     outcomed with rectocele & cystocele  . SAVORY DILATION N/A 03/21/2015   Procedure: SAVORY DILATION;  Surgeon: Danie Binder, MD;  [facility];  Service: Endoscopy;  Laterality: N/A;  . SHOULDER SURGERY     left  . TOTAL KNEE ARTHROPLASTY Right 07/27/2014   Procedure: RIGHT TOTAL KNEE ARTHROPLASTY;  Surgeon: Johnny Bridge, MD;  Location: [facility];  Service: Orthopedics;  Laterality: Right;  . TOTAL SHOULDER ARTHROPLASTY Right 06/21/2015   Procedure: RIGHT TOTAL SHOULDER ARTHROPLASTY;  Surgeon: Marchia Bond, MD;  Location: [facility];  Service: Orthopedics;  Laterality: Right;  . TOTAL THYROIDECTOMY  2010   Allergies  Allergen Reactions  . Celecoxib Shortness Of Breath  . Dexilant [Dexlansoprazole] Anaphylaxis  abd pain   Current Outpatient Prescriptions  Medication Sig Dispense Refill  . albuterol (PROVENTIL HFA) 108 (90 BASE) MCG/ACT inhaler Inhale 1-2 puffs into the lungs every 6 (six) hours as needed for wheezing or shortness of breath. Wheezing, Asthma Symptoms    . amLODipine (NORVASC) 2.5 MG tablet Take 2.5 mg by mouth daily.    . Ascorbic Acid (VITAMIN C PO) Take 1 tablet by mouth daily.    Marland Kitchen aspirin 81 MG tablet Take 81 mg by mouth daily.    . calcium-vitamin D (OSCAL WITH D) 500-200 MG-UNIT per tablet Take 1 tablet by mouth 2 (two) times daily.     . diazepam (VALIUM) 5 MG  tablet Take one tablet by mouth at bedtime as needed for sleep (Patient taking differently: Take 2.5-5 mg by mouth at bedtime as needed (sleep). )    . Glucosamine HCl (GLUCOSAMINE PO) Take 1 tablet by mouth 2 (two) times daily.    Marland Kitchen levocetirizine (XYZAL) 5 MG tablet Take 5 mg by mouth every evening.      . Levothyroxine 75 MCG tablet Take 1 daily.    . montelukast (SINGULAIR) 10 MG tablet Take 10 mg by mouth at bedtime.    Marland Kitchen omeprazole 20 MG capsule 1 PO 30 mins prior to breakfast and supper    . pravastatin (PRAVACHOL) 80 MG tablet Take 1 tablet (80 mg total) by mouth every evening.    . pyridOXINE 100 MG tablet Take 100 mg by mouth at bedtime.    .  (PERI-COLACE PO) Take 1 BID     . vitamin E 400 UNIT capsule Take 400 UnitsQHS     . FLUZONE HIGH-DOSE 0.5 ML SUSY inject 0.5 milliliter intramuscularly  0  . FISH OIL 1200 MG CAPS Take 1 capsule by mouth daily.     . CYANOCOBALAMIN 1000 MCG tablet Take 1,000 mcg QAM.     Review of Systems PER HPI OTHERWISE ALL SYSTEMS ARE NEGATIVE.    Objective:   Physical Exam  Constitutional: She is oriented to person, place, and time. She appears well-developed and well-nourished. No distress.  HENT:  Head: Normocephalic and atraumatic.  Mouth/Throat: Oropharynx is clear and moist. No oropharyngeal exudate.  Eyes: Pupils are equal, round, and reactive to light. No scleral icterus.  Neck: Normal range of motion. Neck supple.  Cardiovascular: Normal rate, regular rhythm and normal heart sounds.   Pulmonary/Chest: Effort normal and breath sounds normal. No respiratory distress.  Abdominal: Soft. Bowel sounds are normal. She exhibits no distension. There is no tenderness.  Musculoskeletal: She exhibits no edema.  Lymphadenopathy:    She has no cervical adenopathy.  Neurological: She is alert and oriented to person, place, and time.  NO  NEW FOCAL DEFICITS  Psychiatric: She has a normal mood and affect.  Vitals reviewed.     Assessment & Plan:

## 2016-08-23 NOTE — Assessment & Plan Note (Signed)
SYMPTOMS FAIRLY WELL CONTROLLED OFF PPI.  CONTINUE TO MONITOR SYMPTOMS. FOLLOW UP IN 4 MOS.

## 2016-08-23 NOTE — Assessment & Plan Note (Signed)
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 CLINICALLY IMPROVED with speech therapy. WEIGH DOWN 11 LBS SINE MAY 2017.  CONTINUE TO MONITOR YOUR WEIGHT. TRY TO GET WEIGHT UP TO 118 LBS.  DRINK BOOST, ENSURE, OR CARNATION INSTANT BREAKFAST WITH MILK 2 or 3 times a day until weight is up to 118 lbs.  FOLLOW UP IN 4 MOS.

## 2016-08-23 NOTE — Patient Instructions (Signed)
CONTINUE TO MONITOR YOUR WEIGHT. TRY TO GET YOUR WEIGHT UP TO 118 LBS.  DRINK BOOST, ENSURE, OR CARNATION INSTANT BREAKFAST WITH MILK 2 or 3 times a day until your weight is up to 118 lbs.  FOLLOW UP IN 4 MOS.

## 2016-08-23 NOTE — Progress Notes (Signed)
cc'ed to pcp °

## 2016-08-24 ENCOUNTER — Ambulatory Visit (HOSPITAL_COMMUNITY)
Admission: RE | Admit: 2016-08-24 | Discharge: 2016-08-24 | Disposition: A | Discharge status: Home / Self Care | Payer: Medicare Other | Source: Ambulatory Visit | Attending: Internal Medicine | Admitting: Internal Medicine

## 2016-08-24 DIAGNOSIS — M85851 Other specified disorders of bone density and structure, right thigh: Secondary | ICD-10-CM | POA: Diagnosis not present

## 2016-08-24 DIAGNOSIS — Z79899 Other long term (current) drug therapy: Secondary | ICD-10-CM | POA: Insufficient documentation

## 2016-08-24 DIAGNOSIS — Z1389 Encounter for screening for other disorder: Secondary | ICD-10-CM | POA: Diagnosis not present

## 2016-08-24 DIAGNOSIS — M85861 Other specified disorders of bone density and structure, right lower leg: Secondary | ICD-10-CM | POA: Diagnosis not present

## 2016-08-27 NOTE — Progress Notes (Signed)
ON RECALL  °

## 2016-08-29 ENCOUNTER — Other Ambulatory Visit (INDEPENDENT_AMBULATORY_CARE_PROVIDER_SITE_OTHER): Payer: Medicare Other

## 2016-08-29 ENCOUNTER — Other Ambulatory Visit (HOSPITAL_COMMUNITY): Payer: Medicare Other

## 2016-08-29 DIAGNOSIS — E890 Postprocedural hypothyroidism: Secondary | ICD-10-CM | POA: Diagnosis not present

## 2016-08-29 LAB — TSH: TSH: 0.75 u[IU]/mL (ref 0.35–4.50)

## 2016-08-29 LAB — T4, FREE: Free T4: 1.32 ng/dL (ref 0.60–1.60)

## 2016-08-31 NOTE — Progress Notes (Signed)
Please let patient know that the lab result is normal and no change needed

## 2016-09-04 DIAGNOSIS — G629 Polyneuropathy, unspecified: Secondary | ICD-10-CM | POA: Diagnosis not present

## 2016-09-04 DIAGNOSIS — B351 Tinea unguium: Secondary | ICD-10-CM | POA: Diagnosis not present

## 2016-09-04 DIAGNOSIS — L851 Acquired keratosis [keratoderma] palmaris et plantaris: Secondary | ICD-10-CM | POA: Diagnosis not present

## 2016-09-06 ENCOUNTER — Encounter (INDEPENDENT_AMBULATORY_CARE_PROVIDER_SITE_OTHER): Payer: Self-pay

## 2016-09-06 ENCOUNTER — Ambulatory Visit (HOSPITAL_COMMUNITY): Payer: Medicare Other | Attending: Gastroenterology | Admitting: Speech Pathology

## 2016-09-06 ENCOUNTER — Encounter (HOSPITAL_COMMUNITY): Payer: Self-pay | Admitting: Speech Pathology

## 2016-09-06 DIAGNOSIS — R1312 Dysphagia, oropharyngeal phase: Secondary | ICD-10-CM

## 2016-09-06 NOTE — Therapy (Addendum)
[place], [place], [address] Phone: [phone]   Fax:  [phone]  Speech Language Pathology Treatment  Patient Details  Name: Christine Leblanc MRN: [id number] Date of Birth: Sep 30, 1929 No Data Recorded  Encounter Date: 09/06/2016      End of Session - 09/06/16 1401    Visit Number 6   Number of Visits 8   Date for SLP Re-Evaluation 09/19/16   Authorization Type Medicare   SLP Start Time 1350   SLP Stop Time  1430   SLP Time Calculation (min) 40 min   Activity Tolerance Patient tolerated treatment well      Past Medical History:  Diagnosis Date  . Anemia   . Asthma   . Cancer ([place])    skin Ca- ? basal cell   . Chronic back pain   . Chronic neck pain   . Degenerative joint disease    Left shoulder; cervical spine, knees & hands   . Gastroesophageal reflux disease    Hiatal hernia; distal esophageal web requiring dilatation; gastric polyps; gastritis; refuses colonoscopy  . GERD (gastroesophageal reflux disease)   . History of stress test 1990's   stress test done under the care of Dr. Lattie Haw & Dr. Gwenlyn Found, now being followed by Dr. Harl Bowie- in [place] , recently seen & told to f/U in one yr.   . Hyperlipidemia 04/27/2011  . Hypertension   . Hypothyroidism   . Lymphocytic thyroiditis   . Multiple thyroid nodules 2010   Adenomatous; thyroidectomy in 2010  . Osteoarthritis of right knee 07/27/2014  . Pneumonia    hosp. for pneumonia- long time ago   . Primary localized osteoarthrosis of right shoulder 06/21/2015  . Seizures ([place])    yes- as a child- & into adult years, states she took med. for them at one time, stopped at 32 yrs. of age   . Syncope and collapse 2007   Possible CVA in 2012 with left lower extremity weakness; refused hospitalization; CT-Atrophy and chronic microvascular ischemic change.     Past Surgical History:  Procedure Laterality Date  . ABDOMINAL HYSTERECTOMY     fibroids  .  CATARACT EXTRACTION     Bilateral; redo surgery on the right for incomplete primary procedure  . COLONOSCOPY  Remote  . ESOPHAGOGASTRODUODENOSCOPY  12/10   Dr. 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 ESOPHAGOGASTRODUODENOSCOPY N/A 03/21/2015   Procedure: ESOPHAGOGASTRODUODENOSCOPY (EGD);  Surgeon: Danie Binder, MD;  [facility];  Service: Endoscopy;  Laterality: N/A;  1130   . EYE SURGERY    . PROLAPSED UTERINE FIBROID LIGATION     outcomed with rectocele & cystocele  . SAVORY DILATION N/A 03/21/2015   Procedure: SAVORY DILATION;  Surgeon: Danie Binder, MD;  [facility];  Service: Endoscopy;  Laterality: N/A;  . SHOULDER SURGERY     left  . TOTAL KNEE ARTHROPLASTY Right 07/27/2014   Procedure: RIGHT TOTAL KNEE ARTHROPLASTY;  Surgeon: Johnny Bridge, MD;  Location: [facility];  Service: Orthopedics;  Laterality: Right;  . TOTAL SHOULDER ARTHROPLASTY Right 06/21/2015   Procedure: RIGHT TOTAL SHOULDER ARTHROPLASTY;  Surgeon: Marchia Bond, MD;  Location: [facility];  Service: Orthopedics;  Laterality: Right;  . TOTAL THYROIDECTOMY  2010    There were no vitals filed for this visit.      Subjective Assessment - 09/06/16 1359    Subjective "I think I am doing well, physically."  Currently in Pain? No/denies               ADULT SLP TREATMENT - 09/06/16 1359      General Information   Behavior/Cognition Alert;Cooperative;Pleasant mood   Patient Positioning Upright in chair   Oral care provided N/A   HPI Christine Leblanc is an 82 yo woman who was referred for MBSS by Dr. Oneida Alar due to reports of dysphagia with liquids occasionally coming out of her nose.Marland Kitchen Pt is a former smoker (quit 37 years ago), had total thyroidectomy in 2010, and had EGD with savory dilation 03/21/2015. She lives at home with her daughter (who has seizures and needs assist). She denies recent bouts of PNA, but is followed by Dr. Harold Hedge  for allergies and asthma and Dr. Benjamine Mola for hearing loss in right ear. Pt has history of GERD, hiatal hernia, distal esophageal web requiring dilation, gastric polyps, and gastritis. She is a former Therapist, sports.      Treatment Provided   Treatment provided Dysphagia     Dysphagia Treatment   Temperature Spikes Noted No   Respiratory Status Room air   Oral Cavity - Dentition Adequate natural dentition   Treatment Methods Therapeutic exercise;Compensation strategy training;Patient/caregiver education   Patient observed directly with PO's Yes   Type of PO's observed Thin liquids   Feeding Able to feed self   Liquids provided via Cup     Pain Assessment   Pain Assessment No/denies pain            SLP Short Term Goals - 09/06/16 1401      SLP SHORT TERM GOAL #1   Title Pt will complete oropharyngeal exercises as assigned 3x/day with use of written cue.   Baseline No exercises at this point   Time 6   Period Weeks   Status Achieved     SLP SHORT TERM GOAL #2   Title Pt will be evaluated by ENT to determine if vocal function exercises are indicated to reduce risk of aspiration and increase vocal quality.   Baseline Will see ENT tomorrow per pt report   Time 6   Period Weeks   Status Achieved     SLP SHORT TERM GOAL #3   Title Pt will demonstrate safe and efficient consumption of D3/mech soft diet with NTL with use of compensatory strategies (bolus hold, head turn to LEFT with chin tuck) and min assist   Baseline Introduced today with written cues provided- max assist   Time 6   Period Weeks   Status Achieved          SLP Long Term Goals - 09/06/16 1433      SLP LONG TERM GOAL #1   Title Same as short          Plan - 09/06/16 1401    Clinical Impression Statement Christine Leblanc was seen for ongoing dysphagia intervention.She continues to use thickener at home for all liquids, but has implemented [employer] as well with success. Exercises completed this date with  verbal cues. Pt is independent with exercises when referring to written reminders. She has continued to lose weight. Recommend follow up MBSS to objectively evaluate swallow following several months of dysphagia therapy. Radiology suite is not yet re-opened at [facility] and pt was offered an appointment at [facility], however she prefers to wait and have MBSS completed in [place]. SLP will request order and plan to complete in the next few weeks. Continue D3/mech  soft with NTL and [place] water protocol.   Speech Therapy Frequency 2x / week   Duration 4 weeks   Treatment/Interventions Aspiration precaution training;Pharyngeal strengthening exercises;Diet toleration management by SLP;Compensatory techniques;Trials of upgraded texture/liquids;Cueing hierarchy;SLP instruction and feedback;Compensatory strategies;Patient/family education   Potential to Achieve Goals Fair   Potential Considerations Severity of impairments   SLP Home Exercise Plan Pt will be independent with HEP as assigned to facilitate carryover of treatment strategies in home environment to decrease risk of aspiration PNA   Consulted and Agree with Plan of Care Patient      Patient will benefit from skilled therapeutic intervention in order to improve the following deficits and impairments:   Dysphagia, oropharyngeal phase      G-Codes - 09/06/16 1434    Functional Assessment Tool Used clinical judgment   Functional Limitations Swallowing   Swallow Goal Status (G8997) At least 20 percent but less than 40 percent impaired, limited or restricted   Swallow Discharge Status (G8998) At least 20 percent but less than 40 percent impaired, limited or restricted      Problem List Patient Active Problem List   Diagnosis Date Noted  . Primary localized osteoarthrosis of right shoulder 06/21/2015  . S/P shoulder replacement 06/21/2015  . Dysphagia, idiopathic 11/29/2014  . Delirium, drug-induced ([facility])  07/29/2014  . Atelectasis 07/29/2014  . Osteoarthritis of right knee 07/27/2014  . Knee osteoarthritis 07/27/2014  . Postsurgical hypothyroidism 04/22/2014  . Unspecified hypothyroidism 10/12/2013  . Syncope and collapse   . Anemia, normocytic normochromic 04/26/2012  . Gastroesophageal reflux disease   . Lymphocytic thyroiditis   . Hypertension 12/07/2011  . History of TIAs 12/07/2011  . Leg pain, bilateral 04/27/2011  . Hyperlipidemia 04/27/2011    SPEECH THERAPY DISCHARGE SUMMARY  Visits from Start of Care: 6  Current functional level related to goals / functional outcomes: Pt tolerating D3/mech soft and NTL with strategies and [place] water protocol at this time. Will need objective reassessment (MBSS) to assess for safety with thin liquids.   Remaining deficits: Wet vocal quality- will request MBSS to assess for possible upgrade to thin   Education / Equipment: HEP  Plan: Patient agrees to discharge.  Patient goals were partially met. Patient is being discharged due to being pleased with the current functional level.  ?????      Thank you,   , CCC-SLP [phone]  , 09/06/2016, 2:35 PM  [facility] [address] [place], [place], [address] Phone: [phone]   Fax:  [phone]   Name: Christine Leblanc MRN: [id number] Date of Birth: 03/13/1929  

## 2016-09-07 DIAGNOSIS — J45909 Unspecified asthma, uncomplicated: Secondary | ICD-10-CM | POA: Diagnosis not present

## 2016-09-11 ENCOUNTER — Ambulatory Visit: Payer: Medicare Other | Admitting: Cardiology

## 2016-09-18 DIAGNOSIS — X32XXXD Exposure to sunlight, subsequent encounter: Secondary | ICD-10-CM | POA: Diagnosis not present

## 2016-09-18 DIAGNOSIS — L570 Actinic keratosis: Secondary | ICD-10-CM | POA: Diagnosis not present

## 2016-09-18 DIAGNOSIS — Z85828 Personal history of other malignant neoplasm of skin: Secondary | ICD-10-CM | POA: Diagnosis not present

## 2016-09-18 DIAGNOSIS — Z08 Encounter for follow-up examination after completed treatment for malignant neoplasm: Secondary | ICD-10-CM | POA: Diagnosis not present

## 2016-09-18 DIAGNOSIS — Z1283 Encounter for screening for malignant neoplasm of skin: Secondary | ICD-10-CM | POA: Diagnosis not present

## 2016-09-19 ENCOUNTER — Ambulatory Visit: Payer: Medicare Other | Admitting: Neurology

## 2016-09-19 DIAGNOSIS — J45909 Unspecified asthma, uncomplicated: Secondary | ICD-10-CM | POA: Diagnosis not present

## 2016-09-20 ENCOUNTER — Encounter: Payer: Self-pay | Admitting: Cardiology

## 2016-09-20 ENCOUNTER — Ambulatory Visit (INDEPENDENT_AMBULATORY_CARE_PROVIDER_SITE_OTHER): Payer: Medicare Other | Admitting: Cardiology

## 2016-09-20 VITALS — BP 132/62 | HR 100 | Ht 62.0 in | Wt 113.8 lb

## 2016-09-20 DIAGNOSIS — I10 Essential (primary) hypertension: Secondary | ICD-10-CM | POA: Diagnosis not present

## 2016-09-20 DIAGNOSIS — E785 Hyperlipidemia, unspecified: Secondary | ICD-10-CM

## 2016-09-20 DIAGNOSIS — Z79899 Other long term (current) drug therapy: Secondary | ICD-10-CM | POA: Diagnosis not present

## 2016-09-20 NOTE — Progress Notes (Signed)
Clinical Summary Christine Leblanc is a 82 y.o.female seen today for follow up of the following medical problems.   1. HTN - does not check bp regularly at home - compliant with meds  2. Hyperlipidemia - compliant with pravastatin  3. Hx of TIA - on ASA and statin for secondary prevention  4. Hypothyroidism - followed by endocrine  5. Dysphagia - followed by GI.  Past Medical History:  Diagnosis Date  . Anemia   . Asthma   . Cancer ([place])    skin Ca- ? basal cell   . Chronic back pain   . Chronic neck pain   . Degenerative joint disease    Left shoulder; cervical spine, knees & hands   . Gastroesophageal reflux disease    Hiatal hernia; distal esophageal web requiring dilatation; gastric polyps; gastritis; refuses colonoscopy  . GERD (gastroesophageal reflux disease)   . History of stress test 1990's   stress test done under the care of Dr. Lattie Haw & Dr. Gwenlyn Found, now being followed by Dr. Harl Bowie- in [place] , recently seen & told to f/U in one yr.   . Hyperlipidemia 04/27/2011  . Hypertension   . Hypothyroidism   . Lymphocytic thyroiditis   . Multiple thyroid nodules 2010   Adenomatous; thyroidectomy in 2010  . Osteoarthritis of right knee 07/27/2014  . Pneumonia    hosp. for pneumonia- long time ago   . Primary localized osteoarthrosis of right shoulder 06/21/2015  . Seizures ([place])    yes- as a child- & into adult years, states she took med. for them at one time, stopped at 32 yrs. of age   . Syncope and collapse 2007   Possible CVA in 2012 with left lower extremity weakness; refused hospitalization; CT-Atrophy and chronic microvascular ischemic change.      Allergies  Allergen Reactions  . Celecoxib Shortness Of Breath  . Dexilant [Dexlansoprazole] Anaphylaxis    abd pain     Current Outpatient Prescriptions  Medication Sig Dispense Refill  . albuterol (PROVENTIL HFA) 108 (90 BASE) MCG/ACT inhaler Inhale 1-2 puffs into the lungs every 6 (six) hours as  needed for wheezing or shortness of breath. Wheezing, Asthma Symptoms    . amLODipine (NORVASC) 2.5 MG tablet Take 2.5 mg by mouth daily.    . Ascorbic Acid (VITAMIN C PO) Take 1 tablet by mouth daily.    Marland Kitchen aspirin 81 MG tablet Take 81 mg by mouth daily.    . calcium-vitamin D (OSCAL WITH D) 500-200 MG-UNIT per tablet Take 1 tablet by mouth 2 (two) times daily.     . diazepam (VALIUM) 5 MG tablet Take one tablet by mouth at bedtime as needed for sleep (Patient taking differently: Take 2.5-5 mg by mouth at bedtime as needed (sleep). ) 30 tablet 5  . FLUZONE HIGH-DOSE 0.5 ML SUSY inject 0.5 milliliter intramuscularly  0  . Glucosamine HCl (GLUCOSAMINE PO) Take 1 tablet by mouth 2 (two) times daily.    Marland Kitchen levocetirizine (XYZAL) 5 MG tablet Take 5 mg by mouth every evening.      Marland Kitchen levothyroxine (SYNTHROID, LEVOTHROID) 75 MCG tablet Take 1 tablet (75 mcg total) by mouth daily. 30 tablet 3  . montelukast (SINGULAIR) 10 MG tablet Take 10 mg by mouth at bedtime.    . Omega-3 Fatty Acids (FISH OIL) 1200 MG CAPS Take 1 capsule by mouth daily.     Marland Kitchen omeprazole (PRILOSEC) 20 MG capsule 1 PO 30 mins prior to breakfast and supper  180 capsule 3  . pravastatin (PRAVACHOL) 80 MG tablet Take 1 tablet (80 mg total) by mouth every evening. 90 tablet 2  . pyridOXINE (VITAMIN B-6) 100 MG tablet Take 100 mg by mouth at bedtime.    Orlie Dakin Sodium (PERI-COLACE PO) Take 1 tablet by mouth 2 (two) times daily.     . vitamin B-12 (CYANOCOBALAMIN) 1000 MCG tablet Take 1,000 mcg by mouth every morning.    . vitamin E 400 UNIT capsule Take 400 Units by mouth at bedtime.      No current facility-administered medications for this visit.      Past Surgical History:  Procedure Laterality Date  . ABDOMINAL HYSTERECTOMY     fibroids  . CATARACT EXTRACTION     Bilateral; redo surgery on the right for incomplete primary procedure  . COLONOSCOPY  Remote  . ESOPHAGOGASTRODUODENOSCOPY  12/10   Dr. Oneida Alar: probable  distal web s/p dilation small hiatal hernia/gastric polyps/mild gastritis  . ESOPHAGOGASTRODUODENOSCOPY N/A 03/21/2015   Procedure: ESOPHAGOGASTRODUODENOSCOPY (EGD);  Surgeon: Danie Binder, MD;  [facility];  Service: Endoscopy;  Laterality: N/A;  1130   . EYE SURGERY    . PROLAPSED UTERINE FIBROID LIGATION     outcomed with rectocele & cystocele  . SAVORY DILATION N/A 03/21/2015   Procedure: SAVORY DILATION;  Surgeon: Danie Binder, MD;  [facility];  Service: Endoscopy;  Laterality: N/A;  . SHOULDER SURGERY     left  . TOTAL KNEE ARTHROPLASTY Right 07/27/2014   Procedure: RIGHT TOTAL KNEE ARTHROPLASTY;  Surgeon: Johnny Bridge, MD;  Location: [facility];  Service: Orthopedics;  Laterality: Right;  . TOTAL SHOULDER ARTHROPLASTY Right 06/21/2015   Procedure: RIGHT TOTAL SHOULDER ARTHROPLASTY;  Surgeon: Marchia Bond, MD;  Location: [facility];  Service: Orthopedics;  Laterality: Right;  . TOTAL THYROIDECTOMY  2010     Allergies  Allergen Reactions  . Celecoxib Shortness Of Breath  . Dexilant [Dexlansoprazole] Anaphylaxis    abd pain      Family History  Problem Relation Age of Onset  . Heart disease Mother   . Gallbladder disease Mother   . Breast cancer Sister   . Colon cancer Neg Hx   . Stroke Neg Hx      Social History Christine Leblanc reports that she quit smoking about 37 years ago. Her smoking use included Cigarettes. She has a 16.00 pack-year smoking history. She has never used smokeless tobacco. Christine Leblanc reports that she does not drink alcohol.   Review of Systems CONSTITUTIONAL: No weight loss, fever, chills, weakness or fatigue.  HEENT: Eyes: No visual loss, blurred vision, double vision or yellow sclerae.No hearing loss, sneezing, congestion, runny nose or sore throat.  SKIN: No rash or itching.  CARDIOVASCULAR: per HPI RESPIRATORY: No shortness of breath, cough or sputum.  GASTROINTESTINAL: No anorexia, nausea, vomiting or diarrhea. No  abdominal pain or blood.  GENITOURINARY: No burning on urination, no polyuria NEUROLOGICAL: No headache, dizziness, syncope, paralysis, ataxia, numbness or tingling in the extremities. No change in bowel or bladder control.  MUSCULOSKELETAL: No muscle, back pain, joint pain or stiffness.  LYMPHATICS: No enlarged nodes. No history of splenectomy.  PSYCHIATRIC: No history of depression or anxiety.  ENDOCRINOLOGIC: No reports of sweating, cold or heat intolerance. No polyuria or polydipsia.  Marland Kitchen   Physical Examination Vitals:   09/20/16 1347  BP: 132/62  Pulse: 100   Vitals:   09/20/16 1347  Weight: 113 lb 12.8 oz (51.6 kg)  Height: 5\' 2"  (1.575 m)    Gen: resting comfortably, no acute distress HEENT: no scleral icterus, pupils equal round and reactive, no palptable cervical adenopathy,  CV: RRR, no m/r/g, no jvd Resp: Clear to auscultation bilaterally GI: abdomen is soft, non-tender, non-distended, normal bowel sounds, no hepatosplenomegaly MSK: extremities are warm, no edema.  Skin: warm, no rash Neuro:  no focal deficits Psych: appropriate affect    Assessment and Plan  1. HTN - at goal, continue current meds  2. Hyperlipideima - repeat lipid panel - we will continue pravastatin .  3. Hx of TIA -she will  continue ASA and statin for secondary prevention  EKG in clinic shows SR, RAD, RBBB  F/u 1 year      Arnoldo Lenis, M.D., F.A.C.C.

## 2016-09-20 NOTE — Patient Instructions (Signed)
Medication Instructions:  Your physician recommends that you continue on your current medications as directed. Please refer to the Current Medication list given to you today.   Labwork: Your physician recommends that you return for lab work in: ASAP BMET MAGNESIUM FASTING LIPID   Testing/Procedures: NONE  Follow-Up: Your physician wants you to follow-up in: 1 YEAR.  You will receive a reminder letter in the mail two months in advance. If you don't receive a letter, please call our office to schedule the follow-up appointment.   Any Other Special Instructions Will Be Listed Below (If Applicable).     If you need a refill on your cardiac medications before your next appointment, please call your pharmacy.

## 2016-09-21 DIAGNOSIS — I10 Essential (primary) hypertension: Secondary | ICD-10-CM | POA: Diagnosis not present

## 2016-09-21 DIAGNOSIS — Z79899 Other long term (current) drug therapy: Secondary | ICD-10-CM | POA: Diagnosis not present

## 2016-09-21 LAB — LIPID PANEL
CHOLESTEROL: 145 mg/dL (ref 125–200)
HDL: 58 mg/dL (ref >=46)
LDL Cholesterol: 77 mg/dL (ref <130)
Total CHOL/HDL Ratio: 2.5 Ratio (ref <=5.0)
Triglycerides: 48 mg/dL (ref <150)
VLDL: 10 mg/dL (ref <30)

## 2016-09-21 LAB — BASIC METABOLIC PANEL
BUN: 24 mg/dL (ref 7–25)
CALCIUM: 8.9 mg/dL (ref 8.6–10.4)
CO2: 30 mmol/L (ref 20–31)
CREATININE: 0.73 mg/dL (ref 0.60–0.88)
Chloride: 105 mmol/L (ref 98–110)
GLUCOSE: 86 mg/dL (ref 65–99)
Potassium: 4.1 mmol/L (ref 3.5–5.3)
Sodium: 142 mmol/L (ref 135–146)

## 2016-09-21 LAB — MAGNESIUM: MAGNESIUM: 2 mg/dL (ref 1.5–2.5)

## 2016-09-25 ENCOUNTER — Encounter: Payer: Self-pay | Admitting: Neurology

## 2016-09-25 DIAGNOSIS — J45909 Unspecified asthma, uncomplicated: Secondary | ICD-10-CM | POA: Diagnosis not present

## 2016-09-28 DIAGNOSIS — J45909 Unspecified asthma, uncomplicated: Secondary | ICD-10-CM | POA: Diagnosis not present

## 2016-10-01 ENCOUNTER — Ambulatory Visit (INDEPENDENT_AMBULATORY_CARE_PROVIDER_SITE_OTHER): Payer: Medicare Other | Admitting: Endocrinology

## 2016-10-01 ENCOUNTER — Encounter: Payer: Self-pay | Admitting: Endocrinology

## 2016-10-01 VITALS — BP 116/63 | HR 78 | Ht 62.0 in | Wt 116.0 lb

## 2016-10-01 DIAGNOSIS — E890 Postprocedural hypothyroidism: Secondary | ICD-10-CM | POA: Diagnosis not present

## 2016-10-01 LAB — TSH: TSH: 0.84 u[IU]/mL (ref 0.35–4.50)

## 2016-10-01 LAB — T4, FREE: Free T4: 1.21 ng/dL (ref 0.60–1.60)

## 2016-10-01 NOTE — Progress Notes (Signed)
Christine Leblanc 82 y.o.    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   She previously had been on 100 mcg since 03/2014  In July she was being evaluated by her PCP for weight loss and was found to have a low TSH level of 0.047  Free T4 was high She was not having any other symptoms Because of her confirmed iatrogenic hyperthyroidism on her visit on 07/30/16 her dose was reduced down to 75 g  She does feel tired at times as before.  No complaints of palpitations or weight loss Appetite is somewhat better and her weight is coming up  Compliance with the medical regimen has been as prescribed with taking the tablet in the morning before breakfast.  Lab Results  Component Value Date   TSH 0.75 08/29/2016   TSH 0.03 (L) 07/30/2016   TSH 4.470 02/08/2016   FREET4 1.32 08/29/2016   FREET4 1.76 (H) 07/30/2016   FREET4 1.55 02/08/2016         Medication List       Accurate as of 10/01/16  3:12 PM. Always use your most recent med list.          amLODipine 2.5 MG tablet Commonly known as:  NORVASC Take 2.5 mg by mouth daily.   aspirin 81 MG tablet Take 81 mg by mouth daily.   calcium-vitamin D 500-200 MG-UNIT tablet Commonly known as:  OSCAL WITH D Take 1 tablet by mouth 2 (two) times daily.   diazepam 5 MG tablet Commonly known as:  VALIUM Take one tablet by mouth at bedtime as needed for sleep   Fish Oil 1200 MG Caps Take 1 capsule by mouth daily.   FLUZONE HIGH-DOSE 0.5 ML Susy Generic drug:  Influenza Vac Split High-Dose inject 0.5 milliliter  intramuscularly   GLUCOSAMINE PO Take 1 tablet by mouth 2 (two) times daily.   levocetirizine 5 MG tablet Commonly known as:  XYZAL Take 5 mg by mouth every evening.   levothyroxine 75 MCG tablet Commonly known as:  SYNTHROID, LEVOTHROID Take 1 tablet (75 mcg total) by mouth daily.   montelukast 10 MG tablet Commonly known as:  SINGULAIR Take 10 mg by mouth at bedtime.   omeprazole 20 MG capsule Commonly known as:  PRILOSEC 1 PO 30 mins prior to breakfast and supper   PERI-COLACE PO Take 1 tablet by mouth 2 (two) times daily.   pravastatin 80 MG tablet Commonly known as:  PRAVACHOL Take 1 tablet (80 mg total) by mouth every evening.   PROVENTIL HFA 108 (90 Base) MCG/ACT inhaler Generic drug:  albuterol Inhale 1-2 puffs into the lungs every 6 (six) hours as needed for wheezing or shortness of breath. Wheezing, Asthma Symptoms   pyridOXINE 100 MG tablet Commonly known as:  VITAMIN B-6 Take 100 mg by mouth at bedtime.   vitamin B-12 1000 MCG tablet Commonly known as:  CYANOCOBALAMIN Take 1,000 mcg by mouth every morning.   VITAMIN C PO Take 1 tablet by mouth daily.   vitamin E 400 UNIT capsule Take 400 Units by mouth at bedtime.  Allergies:  Allergies  Allergen Reactions  . Celecoxib Shortness Of Breath  . Dexilant [Dexlansoprazole] Anaphylaxis    abd pain    Past Medical History:  Diagnosis Date  . Anemia   . Asthma   . Cancer ([place])    skin Ca- ? basal cell   . Chronic back pain   . Chronic neck pain   . Degenerative joint disease    Left shoulder; cervical spine, knees & hands   . Gastroesophageal reflux disease    Hiatal hernia; distal esophageal web requiring dilatation; gastric polyps; gastritis; refuses colonoscopy  . GERD (gastroesophageal reflux disease)   . History of stress test 1990's   stress test done under the care of Dr. Lattie Haw & Dr. Gwenlyn Found, now being followed by Dr. Harl Bowie- in [place] , recently seen & told to f/U in one yr.    . Hyperlipidemia 04/27/2011  . Hypertension   . Hypothyroidism   . Lymphocytic thyroiditis   . Multiple thyroid nodules 2010   Adenomatous; thyroidectomy in 2010  . Osteoarthritis of right knee 07/27/2014  . Pneumonia    hosp. for pneumonia- long time ago   . Primary localized osteoarthrosis of right shoulder 06/21/2015  . Seizures ([place])    yes- as a child- & into adult years, states she took med. for them at one time, stopped at 32 yrs. of age   . Syncope and collapse 2007   Possible CVA in 2012 with left lower extremity weakness; refused hospitalization; CT-Atrophy and chronic microvascular ischemic change.     Past Surgical History:  Procedure Laterality Date  . ABDOMINAL HYSTERECTOMY     fibroids  . CATARACT EXTRACTION     Bilateral; redo surgery on the right for incomplete primary procedure  . COLONOSCOPY  Remote  . ESOPHAGOGASTRODUODENOSCOPY  12/10   Dr. 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 ESOPHAGOGASTRODUODENOSCOPY N/A 03/21/2015   Procedure: ESOPHAGOGASTRODUODENOSCOPY (EGD);  Surgeon: Danie Binder, MD;  [facility];  Service: Endoscopy;  Laterality: N/A;  1130   . EYE SURGERY    . PROLAPSED UTERINE FIBROID LIGATION     outcomed with rectocele & cystocele  . SAVORY DILATION N/A 03/21/2015   Procedure: SAVORY DILATION;  Surgeon: Danie Binder, MD;  [facility];  Service: Endoscopy;  Laterality: N/A;  . SHOULDER SURGERY     left  . TOTAL KNEE ARTHROPLASTY Right 07/27/2014   Procedure: RIGHT TOTAL KNEE ARTHROPLASTY;  Surgeon: Johnny Bridge, MD;  Location: [facility];  Service: Orthopedics;  Laterality: Right;  . TOTAL SHOULDER ARTHROPLASTY Right 06/21/2015   Procedure: RIGHT TOTAL SHOULDER ARTHROPLASTY;  Surgeon: Marchia Bond, MD;  Location: [facility];  Service: Orthopedics;  Laterality: Right;  . TOTAL THYROIDECTOMY  2010    Family History  Problem Relation Age of Onset  . Heart disease Mother    . Gallbladder disease Mother   . Breast cancer Sister   . Colon cancer Neg Hx   . Stroke Neg Hx     Social History:  reports that she quit smoking about 37 years ago. Her smoking use included Cigarettes. She has a 16.00 pack-year smoking history. She has never used smokeless tobacco. She reports that she does not drink alcohol or use drugs.  REVIEW Of SYSTEMS:  Review of Systems  Gastrointestinal:       Dysphagia +   Weight History:   Wt Readings from Last 3 Encounters:  10/01/16 116 lb (52.6 kg)  09/20/16 113 lb 12.8 oz (51.6 kg)  08/23/16 112 lb 6.4 oz (51 kg)    History of hypertension followed by PCP  She says she was taking B12 supplements on the phone to give her more energy but her level was high when she had it done at the PCPs office and is asking about continuing the supplement   Examination:   BP 116/63   Pulse 78   Ht 5\' 2"  (1.575 m)   Wt 116 lb (52.6 kg)   BMI 21.22 kg/m    No tremor. Biceps reflexes appear slow    Assessments   Hypothyroidism, postsurgical with previously stable supplement those those of levothyroxine With her weight loss she has required lower doses of levothyroxine  Currently she is on 75 g, difficult to assess her subjectively and objectively since she usually has fatigue Reflexes appear slowed today  Will check her labs again today and decide on what dose she needs    Christine Leblanc 10/01/2016, 3:12 PM      Addendum: TSH normal, continue 75 g

## 2016-10-01 NOTE — Progress Notes (Signed)
Please let patient know that the lab result is normal and no further action needed

## 2016-10-02 DIAGNOSIS — J45909 Unspecified asthma, uncomplicated: Secondary | ICD-10-CM | POA: Diagnosis not present

## 2016-10-03 ENCOUNTER — Other Ambulatory Visit: Payer: Self-pay | Admitting: Endocrinology

## 2016-10-03 MED ORDER — LEVOTHYROXINE SODIUM 75 MCG PO TABS: 75.0000 ug | ORAL_TABLET | Freq: Every day | ORAL | 2 refills | Status: DC

## 2016-10-05 DIAGNOSIS — J309 Allergic rhinitis, unspecified: Secondary | ICD-10-CM | POA: Diagnosis not present

## 2016-10-08 DIAGNOSIS — M19011 Primary osteoarthritis, right shoulder: Secondary | ICD-10-CM | POA: Diagnosis not present

## 2016-10-09 DIAGNOSIS — J45909 Unspecified asthma, uncomplicated: Secondary | ICD-10-CM | POA: Diagnosis not present

## 2016-10-10 ENCOUNTER — Other Ambulatory Visit: Payer: Self-pay | Admitting: *Deleted

## 2016-10-12 DIAGNOSIS — J45909 Unspecified asthma, uncomplicated: Secondary | ICD-10-CM | POA: Diagnosis not present

## 2016-10-15 DIAGNOSIS — J45909 Unspecified asthma, uncomplicated: Secondary | ICD-10-CM | POA: Diagnosis not present

## 2016-10-16 ENCOUNTER — Telehealth: Payer: Self-pay | Admitting: Neurology

## 2016-10-16 ENCOUNTER — Ambulatory Visit: Payer: Medicare Other | Admitting: Neurology

## 2016-10-16 NOTE — Telephone Encounter (Signed)
Pt called office back. She stated she came to office today thinking she had appt. Did not receive letter to r/s 10/17 appt. Advised Dr Jaynee Eagles working in hospital this week. Made appt for 10/31/16 at 1pm. Offered several other appt at 4pm, but pt declined. Advised her to check in at 1245pm. She verbalized understanding. She states she doesn't want a later appt because she does not like being out in the dark.

## 2016-10-16 NOTE — Telephone Encounter (Signed)
LVM for pt to call back. I returned her call. Gave [facility] phone number.

## 2016-10-16 NOTE — Telephone Encounter (Signed)
Patient would like a call back from the nurse regarding the results of her MRI. Please call her cell today at [phone].

## 2016-10-19 DIAGNOSIS — J309 Allergic rhinitis, unspecified: Secondary | ICD-10-CM | POA: Diagnosis not present

## 2016-10-22 DIAGNOSIS — J45909 Unspecified asthma, uncomplicated: Secondary | ICD-10-CM | POA: Diagnosis not present

## 2016-10-25 DIAGNOSIS — J45909 Unspecified asthma, uncomplicated: Secondary | ICD-10-CM | POA: Diagnosis not present

## 2016-10-30 DIAGNOSIS — J45909 Unspecified asthma, uncomplicated: Secondary | ICD-10-CM | POA: Diagnosis not present

## 2016-10-31 ENCOUNTER — Ambulatory Visit: Payer: Self-pay | Admitting: Neurology

## 2016-11-01 DIAGNOSIS — Z23 Encounter for immunization: Secondary | ICD-10-CM | POA: Diagnosis not present

## 2016-11-05 ENCOUNTER — Encounter: Payer: Self-pay | Admitting: Gastroenterology

## 2016-11-07 DIAGNOSIS — J45909 Unspecified asthma, uncomplicated: Secondary | ICD-10-CM | POA: Diagnosis not present

## 2016-11-12 ENCOUNTER — Other Ambulatory Visit: Payer: Self-pay

## 2016-11-12 DIAGNOSIS — R1310 Dysphagia, unspecified: Secondary | ICD-10-CM

## 2016-11-13 DIAGNOSIS — H1859 Other hereditary corneal dystrophies: Secondary | ICD-10-CM | POA: Diagnosis not present

## 2016-11-13 DIAGNOSIS — H40012 Open angle with borderline findings, low risk, left eye: Secondary | ICD-10-CM | POA: Diagnosis not present

## 2016-11-13 DIAGNOSIS — H40011 Open angle with borderline findings, low risk, right eye: Secondary | ICD-10-CM | POA: Diagnosis not present

## 2016-11-13 DIAGNOSIS — H21233 Degeneration of iris (pigmentary), bilateral: Secondary | ICD-10-CM | POA: Diagnosis not present

## 2016-11-15 DIAGNOSIS — J45909 Unspecified asthma, uncomplicated: Secondary | ICD-10-CM | POA: Diagnosis not present

## 2016-11-16 DIAGNOSIS — B351 Tinea unguium: Secondary | ICD-10-CM | POA: Diagnosis not present

## 2016-11-16 DIAGNOSIS — G629 Polyneuropathy, unspecified: Secondary | ICD-10-CM | POA: Diagnosis not present

## 2016-11-16 DIAGNOSIS — L851 Acquired keratosis [keratoderma] palmaris et plantaris: Secondary | ICD-10-CM | POA: Diagnosis not present

## 2016-11-28 DIAGNOSIS — J45909 Unspecified asthma, uncomplicated: Secondary | ICD-10-CM | POA: Diagnosis not present

## 2016-12-03 ENCOUNTER — Other Ambulatory Visit (HOSPITAL_COMMUNITY): Payer: Self-pay | Admitting: Specialist

## 2016-12-03 DIAGNOSIS — R1319 Other dysphagia: Secondary | ICD-10-CM

## 2016-12-10 DIAGNOSIS — Z1231 Encounter for screening mammogram for malignant neoplasm of breast: Secondary | ICD-10-CM | POA: Diagnosis not present

## 2016-12-10 DIAGNOSIS — Z803 Family history of malignant neoplasm of breast: Secondary | ICD-10-CM | POA: Diagnosis not present

## 2016-12-11 DIAGNOSIS — J45909 Unspecified asthma, uncomplicated: Secondary | ICD-10-CM | POA: Diagnosis not present

## 2016-12-12 ENCOUNTER — Encounter: Payer: Self-pay | Admitting: Gastroenterology

## 2016-12-12 ENCOUNTER — Ambulatory Visit (INDEPENDENT_AMBULATORY_CARE_PROVIDER_SITE_OTHER): Payer: Medicare Other | Admitting: Gastroenterology

## 2016-12-12 DIAGNOSIS — K219 Gastro-esophageal reflux disease without esophagitis: Secondary | ICD-10-CM | POA: Diagnosis not present

## 2016-12-12 DIAGNOSIS — R1310 Dysphagia, unspecified: Secondary | ICD-10-CM

## 2016-12-12 NOTE — Assessment & Plan Note (Signed)
SYMPTOMS FAIRLY WELL CONTROLLED ON BID PPI.   CONTINUE OMEPRAZOLE.  TAKE TWICE DAILY. PLEASE CALL WITH QUESTIONS OR CONCERNS.  FOLLOW UP IN 6 MOS.

## 2016-12-12 NOTE — Assessment & Plan Note (Signed)
SYMPTOMS FAIRLY WELL CONTROLLED WITH DIET MODIFICATION.  CONTINUE TO FOLLOW DABNEY'S RECOMMENDATIONS TO PREVENT CHOKING AFTER SWALLOWING YOUR FOOD. CONTINUE OMEPRAZOLE.  TAKE TWICE DAILY. PLEASE CALL WITH QUESTIONS OR CONCERNS.  FOLLOW UP IN 6 MOS.

## 2016-12-12 NOTE — Progress Notes (Signed)
Subjective:    Patient ID: Christine Leblanc, female    DOB: 1929-12-08, 82 y.o.   MRN: [id number]  Glo Herring., MD  HPI Taking Tylenol arthritis one a day for back pain but not taking more because she was afraid it might hurt her liver. Stopped LODINE DUE TO AFRAID OF ITS EFFECT ON HER KIDNEYS. SAW NEUROLOGY. HAS A DRIPPING NOSE. HASN'T SEEN ENT. SAW DABNEY AND CAN TELL IF SHE CAN'T SWALLOW RIGHT THEN SHE WILL COUGH. RARELY DOES HER EXERCISES.  FEELS SHE NEEDS OMEPRAZOLE TWICE A DAY BECAUSE IT HELPS WITH HER STOMACH. BMs: OK. 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  PT DENIES FEVER, CHILLS, HEMATOCHEZIA,  nausea, vomiting, melena, diarrhea, CHEST PAIN, SHORTNESS OF BREATH,  CHANGE IN BOWEL IN HABITS, constipation, abdominal pain, OR heartburn or indigestion.  Past Medical History:  Diagnosis Date  . Anemia   . Asthma   . Cancer ([place])    skin Ca- ? basal cell   . Chronic back pain   . Chronic neck pain   . Degenerative joint disease    Left shoulder; cervical spine, knees & hands   . Gastroesophageal reflux disease    Hiatal hernia; distal esophageal web requiring dilatation; gastric polyps; gastritis; refuses colonoscopy  . GERD (gastroesophageal reflux disease)   . History of stress test 1990's   stress test done under the care of Dr. Lattie Haw & Dr. Gwenlyn Found, now being followed by Dr. Harl Bowie- in [place] , recently seen & told to f/U in one yr.   . Hyperlipidemia 04/27/2011  . Hypertension   . Hypothyroidism   . Lymphocytic thyroiditis   . Multiple thyroid nodules 2010   Adenomatous; thyroidectomy in 2010  . Osteoarthritis of right knee 07/27/2014  . Pneumonia    hosp. for pneumonia- long time ago   . Primary localized osteoarthrosis of right shoulder 06/21/2015  . Seizures ([place])    yes- as a child- & into adult years, states she took med. for them at one time, stopped at 32 yrs. of age   . Syncope and collapse 2007   Possible CVA in 2012 with left lower extremity  weakness; refused hospitalization; CT-Atrophy and chronic microvascular ischemic change.     Past Surgical History:  Procedure Laterality Date  . ABDOMINAL HYSTERECTOMY     fibroids  . CATARACT EXTRACTION     Bilateral; redo surgery on the right for incomplete primary procedure  . COLONOSCOPY  Remote  . ESOPHAGOGASTRODUODENOSCOPY  12/10   Dr. 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 ESOPHAGOGASTRODUODENOSCOPY N/A 03/21/2015   Procedure: ESOPHAGOGASTRODUODENOSCOPY (EGD);  Surgeon: Danie Binder, MD;  [facility];  Service: Endoscopy;  Laterality: N/A;  1130   . EYE SURGERY    . PROLAPSED UTERINE FIBROID LIGATION     outcomed with rectocele & cystocele  . SAVORY DILATION N/A 03/21/2015   Procedure: SAVORY DILATION;  Surgeon: Danie Binder, MD;  [facility];  Service: Endoscopy;  Laterality: N/A;  . SHOULDER SURGERY     left  . TOTAL KNEE ARTHROPLASTY Right 07/27/2014   Procedure: RIGHT TOTAL KNEE ARTHROPLASTY;  Surgeon: Johnny Bridge, MD;  Location: [facility];  Service: Orthopedics;  Laterality: Right;  . TOTAL SHOULDER ARTHROPLASTY Right 06/21/2015   Procedure: RIGHT TOTAL SHOULDER ARTHROPLASTY;  Surgeon: Marchia Bond, MD;  Location: [facility];  Service: Orthopedics;  Laterality: Right;  . TOTAL THYROIDECTOMY  2010   Allergies  Allergen Reactions  .  Celecoxib Shortness Of Breath  . Dexilant [Dexlansoprazole] Anaphylaxis    abd pain    Current Outpatient Prescriptions  Medication Sig Dispense Refill  . albuterol (90 BASE) MCG/ACT inhaler Inhale 1-2 puffs into the lungs every 6 (six) hours     . amLODipine (NORVASC) 2.5 MG tablet Take 2.5 mg by mouth daily.    . Ascorbic Acid (VITAMIN C PO) Take 1 tablet by mouth daily.    Marland Kitchen aspirin 81 MG tablet Take 81 mg by mouth daily.    . calcium-vitamin D (OSCAL WITH D) 500-200 MG-UNIT per tablet Take 1 tablet by mouth 2 (two) times daily.     . diazepam (VALIUM) 5 MG  tablet Take one tablet by mouth at bedtime as needed for sleep     . FLUZONE HIGH-DOSE 0.5 ML SUSY inject 0.5 milliliter intramuscularly    . Glucosamine HCl (GLUCOSAMINE PO) Take 1 tablet by mouth 2 (two) times daily.    Marland Kitchen levocetirizine (XYZAL) 5 MG tablet Take 5 mg by mouth every evening.      Marland Kitchen levothyroxine 75 MCG tablet Take 1 tablet (75 mcg total) by mouth daily.    . montelukast (SINGULAIR) 10 MG tablet Take 10 mg by mouth at bedtime.    Marland Kitchen FISH OIL) 1200 MG CAPS Take 1 capsule by mouth daily.     Marland Kitchen omeprazole (PRILOSEC) 20 MG capsule 1 PO 30 mins prior to breakfast and supper    . pravastatin 80 MG tablet Take 1 tablet (80 mg total) by mouth every evening.    . pyridOXINE (VITAMIN B-6) 100 MG tablet Take 100 mg by mouth at bedtime.     PERICOLACE Take 1 tablet by mouth 2 (two) times daily.     . vitamin B-12 1000 MCG tablet Take 1,000 mcg by mouth every morning.    . vitamin E 400 UNIT capsule Take 400 Units by mouth at bedtime.      Review of Systems PER HPI OTHERWISE ALL SYSTEMS ARE NEGATIVE.    Objective:   Physical Exam  Constitutional: She is oriented to person, place, and time. She appears well-developed and well-nourished. No distress.  HENT:  Head: Normocephalic and atraumatic.  Mouth/Throat: Oropharynx is clear and moist. No oropharyngeal exudate.  Eyes: Pupils are equal, round, and reactive to light. No scleral icterus.  Neck: Normal range of motion. Neck supple.  Cardiovascular: Normal rate, regular rhythm and normal heart sounds.   Pulmonary/Chest: Effort normal and breath sounds normal. No respiratory distress.  Abdominal: Soft. Bowel sounds are normal. She exhibits no distension. There is no tenderness.  Musculoskeletal: She exhibits no edema.  Lymphadenopathy:    She has no cervical adenopathy.  Neurological: She is alert and oriented to person, place, and time.  NO  NEW FOCAL DEFICITS  Psychiatric: She has a normal mood and affect.  Vitals reviewed.       Assessment & Plan:

## 2016-12-12 NOTE — Progress Notes (Signed)
ON RECALL  °

## 2016-12-12 NOTE — Patient Instructions (Signed)
CONTINUE TO FOLLOW DABNEY'S RECOMMENDATIONS TO PREVENT CHOKING AFTER SWALLOWING YOUR FOOD.  CONTINUE OMEPRAZOLE.  TAKE TWICE DAILY.  PLEASE CALL WITH QUESTIONS OR CONCERNS.  FOLLOW UP IN 6 MOS.

## 2016-12-12 NOTE — Progress Notes (Signed)
cc'ed to pcp °

## 2016-12-18 ENCOUNTER — Encounter (HOSPITAL_COMMUNITY): Payer: Self-pay | Admitting: Speech Pathology

## 2016-12-18 ENCOUNTER — Ambulatory Visit (HOSPITAL_COMMUNITY)
Admission: RE | Admit: 2016-12-18 | Discharge: 2016-12-18 | Disposition: A | Discharge status: Home / Self Care | Payer: Medicare Other | Source: Ambulatory Visit | Attending: Gastroenterology | Admitting: Gastroenterology

## 2016-12-18 ENCOUNTER — Ambulatory Visit (HOSPITAL_COMMUNITY): Payer: Medicare Other | Attending: Gastroenterology | Admitting: Speech Pathology

## 2016-12-18 DIAGNOSIS — G8929 Other chronic pain: Secondary | ICD-10-CM | POA: Insufficient documentation

## 2016-12-18 DIAGNOSIS — Z96651 Presence of right artificial knee joint: Secondary | ICD-10-CM | POA: Diagnosis not present

## 2016-12-18 DIAGNOSIS — R1312 Dysphagia, oropharyngeal phase: Secondary | ICD-10-CM

## 2016-12-18 DIAGNOSIS — I10 Essential (primary) hypertension: Secondary | ICD-10-CM | POA: Insufficient documentation

## 2016-12-18 DIAGNOSIS — J45909 Unspecified asthma, uncomplicated: Secondary | ICD-10-CM | POA: Diagnosis not present

## 2016-12-18 DIAGNOSIS — R1319 Other dysphagia: Secondary | ICD-10-CM | POA: Insufficient documentation

## 2016-12-18 DIAGNOSIS — D649 Anemia, unspecified: Secondary | ICD-10-CM | POA: Diagnosis not present

## 2016-12-18 DIAGNOSIS — M545 Low back pain: Secondary | ICD-10-CM | POA: Insufficient documentation

## 2016-12-18 DIAGNOSIS — E063 Autoimmune thyroiditis: Secondary | ICD-10-CM | POA: Insufficient documentation

## 2016-12-18 DIAGNOSIS — M1991 Primary osteoarthritis, unspecified site: Secondary | ICD-10-CM | POA: Insufficient documentation

## 2016-12-18 DIAGNOSIS — M542 Cervicalgia: Secondary | ICD-10-CM | POA: Insufficient documentation

## 2016-12-18 DIAGNOSIS — Z23 Encounter for immunization: Secondary | ICD-10-CM | POA: Diagnosis not present

## 2016-12-18 DIAGNOSIS — R1310 Dysphagia, unspecified: Secondary | ICD-10-CM | POA: Diagnosis not present

## 2016-12-18 DIAGNOSIS — K219 Gastro-esophageal reflux disease without esophagitis: Secondary | ICD-10-CM | POA: Insufficient documentation

## 2016-12-18 DIAGNOSIS — E785 Hyperlipidemia, unspecified: Secondary | ICD-10-CM | POA: Insufficient documentation

## 2016-12-18 DIAGNOSIS — R633 Feeding difficulties: Secondary | ICD-10-CM | POA: Diagnosis not present

## 2016-12-18 NOTE — Therapy (Signed)
[place], [place], [address] Phone: [phone]   Fax:  [phone]  Modified Barium Swallow  Patient Details  Name: Christine Leblanc MRN: [id number] Date of Birth: 1929-01-18 No Data Recorded  Encounter Date: 12/18/2016      End of Session - 12/18/16 2000    Visit Number 1   Number of Visits 1   Authorization Type Medicare   SLP Start Time 1310   SLP Stop Time  1410   SLP Time Calculation (min) 60 min   Activity Tolerance Patient tolerated treatment well      Past Medical History:  Diagnosis Date  . Anemia   . Asthma   . Cancer ([place])    skin Ca- ? basal cell   . Chronic back pain   . Chronic neck pain   . Degenerative joint disease    Left shoulder; cervical spine, knees & hands   . Gastroesophageal reflux disease    Hiatal hernia; distal esophageal web requiring dilatation; gastric polyps; gastritis; refuses colonoscopy  . GERD (gastroesophageal reflux disease)   . History of stress test 1990's   stress test done under the care of Dr. Lattie Haw & Dr. Gwenlyn Found, now being followed by Dr. Harl Bowie- in [place] , recently seen & told to f/U in one yr.   . Hyperlipidemia 04/27/2011  . Hypertension   . Hypothyroidism   . Lymphocytic thyroiditis   . Multiple thyroid nodules 2010   Adenomatous; thyroidectomy in 2010  . Osteoarthritis of right knee 07/27/2014  . Pneumonia    hosp. for pneumonia- long time ago   . Primary localized osteoarthrosis of right shoulder 06/21/2015  . Seizures ([place])    yes- as a child- & into adult years, states she took med. for them at one time, stopped at 32 yrs. of age   . Syncope and collapse 2007   Possible CVA in 2012 with left lower extremity weakness; refused hospitalization; CT-Atrophy and chronic microvascular ischemic change.     Past Surgical History:  Procedure Laterality Date  . ABDOMINAL HYSTERECTOMY     fibroids  . CATARACT EXTRACTION     Bilateral; redo surgery  on the right for incomplete primary procedure  . COLONOSCOPY  Remote  . ESOPHAGOGASTRODUODENOSCOPY  12/10   Dr. 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 ESOPHAGOGASTRODUODENOSCOPY N/A 03/21/2015   Procedure: ESOPHAGOGASTRODUODENOSCOPY (EGD);  Surgeon: Danie Binder, MD;  [facility];  Service: Endoscopy;  Laterality: N/A;  1130   . EYE SURGERY    . PROLAPSED UTERINE FIBROID LIGATION     outcomed with rectocele & cystocele  . SAVORY DILATION N/A 03/21/2015   Procedure: SAVORY DILATION;  Surgeon: Danie Binder, MD;  [facility];  Service: Endoscopy;  Laterality: N/A;  . SHOULDER SURGERY     left  . TOTAL KNEE ARTHROPLASTY Right 07/27/2014   Procedure: RIGHT TOTAL KNEE ARTHROPLASTY;  Surgeon: Johnny Bridge, MD;  Location: [facility];  Service: Orthopedics;  Laterality: Right;  . TOTAL SHOULDER ARTHROPLASTY Right 06/21/2015   Procedure: RIGHT TOTAL SHOULDER ARTHROPLASTY;  Surgeon: Marchia Bond, MD;  Location: [facility];  Service: Orthopedics;  Laterality: Right;  . TOTAL THYROIDECTOMY  2010    There were no vitals filed for this visit.      Subjective Assessment - 12/18/16 1945    Subjective "I have gained some weight, but my nose still runs."   Special Tests MBSS  Currently in Pain? No/denies             General - 12/18/16 1946      General Information   Date of Onset 05/02/16   HPI [name] is an 82 yo woman who was referred for MBSS by Dr. Oneida Alar. She is known to this SLP from previous MBSS completed May 2016 and subsequent dysphagia therapy. Pt is a former smoker (quit 37 years ago), had total thyroidectomy in 2010, and had EGD with savory dilation 03/21/2015. She lives at home with her daughter (who has seizures and needs assist). She denies recent bouts of PNA, but is followed by Dr. Harold Hedge for allergies and asthma and Dr. Benjamine Mola for hearing loss in right ear. Pt has history of GERD, hiatal  hernia, distal esophageal web requiring dilation, gastric polyps, and gastritis. She is a former Therapist, sports. She was consuming a mechanical soft diet with nectar-thick liquids last spring, but stopped thickening her liquids in Aug/Sept of this year. She denies recent respiratory infections and reports that he actually feels "better" and has gained some weight.   Type of Study MBS-Modified Barium Swallow Study   Previous Swallow Assessment MBSS May 2017 D3/NTL and Mare Ferrari water protocol   Diet Prior to this Study Dysphagia 3 (soft);Thin liquids   Temperature Spikes Noted No   Respiratory Status Room air   History of Recent Intubation No   Behavior/Cognition Alert;Cooperative;Pleasant mood   Oral Cavity Assessment Within Functional Limits   Oral Care Completed by SLP No   Oral Cavity - Dentition Dentures, top   Vision Functional for self feeding   Self-Feeding Abilities Able to feed self   Patient Positioning Upright in chair   Baseline Vocal Quality Normal   Volitional Cough Strong   Volitional Swallow Able to elicit   Anatomy Within functional limits   Pharyngeal Secretions Not observed secondary MBS            Oral Preparation/Oral Phase - 12/18/16 1951      Oral Preparation/Oral Phase   Oral Phase Within functional limits     Electrical stimulation - Oral Phase   Was Electrical Stimulation Used No          Pharyngeal Phase - 12/18/16 1951      Pharyngeal Phase   Pharyngeal Phase Impaired     Pharyngeal - Nectar   Pharyngeal- Nectar Cup Swallow initiation at pyriform sinus;Reduced pharyngeal peristalsis;Reduced epiglottic inversion;Reduced laryngeal elevation;Reduced airway/laryngeal closure;Reduced tongue base retraction;Penetration/Apiration after swallow;Trace aspiration;Pharyngeal residue - valleculae;Pharyngeal residue - pyriform;Pharyngeal residue - cp segment   Pharyngeal Material enters airway, passes BELOW cords without attempt by patient to eject out (silent  aspiration);Material enters airway, passes BELOW cords and not ejected out despite cough attempt by patient   Pharyngeal- Nectar Straw --  assessed for AP view- transit bilateral     Pharyngeal - Thin   Pharyngeal- Thin Teaspoon Not tested   Pharyngeal- Thin Cup Swallow initiation at vallecula;Swallow initiation at pyriform sinus;Reduced pharyngeal peristalsis;Reduced epiglottic inversion;Reduced laryngeal elevation;Reduced airway/laryngeal closure;Reduced tongue base retraction;Penetration/Apiration after swallow;Trace aspiration;Pharyngeal residue - valleculae;Pharyngeal residue - pyriform;Pharyngeal residue - cp segment   Pharyngeal Material does not enter airway;Material enters airway, passes BELOW cords without attempt by patient to eject out (silent aspiration)     Pharyngeal - Solids   Pharyngeal- Puree Swallow initiation at vallecula;Reduced pharyngeal peristalsis;Reduced epiglottic inversion;Reduced laryngeal elevation;Reduced tongue base retraction;Pharyngeal residue - valleculae   Pharyngeal- Mechanical Soft Swallow initiation at vallecula;Reduced pharyngeal peristalsis;Reduced epiglottic inversion;Reduced laryngeal elevation;Reduced  tongue base retraction;Pharyngeal residue - valleculae   Pharyngeal- Regular Not tested   Pharyngeal- Pill Not tested     Pharyngeal Phase - Comment   Pharyngeal Comment Previously Pt benefited from head turn to left- this was not effective today     Electrical Stimulation - Pharyngeal Phase   Was Electrical Stimulation Used No          Cricopharyngeal Phase - 01-15-2017 1957      Cervical Esophageal Phase   Cervical Esophageal Phase Within functional limits          Plan - 15-Jan-2017 2000    Clinical Impression Statement Pt presents with moderate pharyngeal phase dysphagia characterized by decreased tongue base retraction, epiglottic deflection, hyolaryngeal excursion, and UES relaxation resulting in decreased pharyngeal pressure with pooling  in valleculae (moderate/severe) with all consistencies and textures, pooling in pyriforms with all liquids and aspiration after the swallow (this was delayed) which did not elicit spontaneous cough until aspirate ran down anterior tracheal wall deeper into trachea/lungs. Pt experienced increased delay in swallow trigger (spilled to pyriforms) when presented with nectar-thick liquids and she also aspirated NTL silently several seconds after the swallow (residuals from valleculae spilled down into laryngeal vestibule). No pyriform or UES residuals noted with puree or mech soft, however significant vallecular residue noted. Unfortunately, Pt continues to be at a heightened risk for aspiration given pharyngeal weakness and residuals post swallow with decreased pharyngeal and laryngeal sensation, however Pt has been consuming soft diet with thin liquids without developing aspiration PNA. Pt did participate in dysphagia therapy and was given home exercise program to continue, however she admits to poor compliance of same.   Previously, Pt benefited from head turn to the left when triggering swallow to reduce residuals, but this was not effective today. Pt was also turned AP and pharyngeal transit was noted to be bilateral. Pt with evidence of reduced relaxation of UES when swallowing liquids as evidenced by prominent CP immediately after the swallow and mild backflow of liquids into pyriforms. Recommend that Pt continue with chin tuck against resistance exercises and take small sips with throat clear and frequent repeat/dry swallows. If Pt wishes for further work up, I would recommend a referral to Dr. Johnna Acosta (laryngologist) at [facility] who may consider dilation of UES to see if this offers some relief. Pt states that she is not sure she wants further work up and states that she feels like she is doing better. Given that Pt aspirated nectars as well as thin, recommend THIN liquids due to high risk for developing  aspiration PNA if aspirating thickening agents (and also more difficult to expel from lungs). Continue D3/mech soft with thin liquids with strategies. Pt given my contact information should she have further questions.    Speech Therapy Frequency 2x / week   Duration 4 weeks   Treatment/Interventions Aspiration precaution training;Pharyngeal strengthening exercises;Diet toleration management by SLP;Compensatory techniques;Trials of upgraded texture/liquids;Cueing hierarchy;SLP instruction and feedback;Compensatory strategies;Patient/family education   Potential to Achieve Goals Fair   Potential Considerations Severity of impairments   SLP Home Exercise Plan Pt will be independent with HEP as assigned to facilitate carryover of treatment strategies in home environment to decrease risk of aspiration PNA   Consulted and Agree with Plan of Care Patient      Patient will benefit from skilled therapeutic intervention in order to improve the following deficits and impairments:   Dysphagia, oropharyngeal phase      G-Codes - 01-15-17 Apr 27, 2000  Functional Assessment Tool Used clinical judgment; MBSS   Functional Limitations Swallowing   Swallow Current Status [id number]) At least 20 percent but less than 40 percent impaired, limited or restricted   Swallow Goal Status [id number]) At least 20 percent but less than 40 percent impaired, limited or restricted   Swallow Discharge Status [id number]) At least 20 percent but less than 40 percent impaired, limited or restricted          Recommendations/Treatment - 12/18/16 1957      Swallow Evaluation Recommendations   Recommended Consults Consider ENT evaluation  Consider ENT consult to Dr. Mila Homer for CP dilation   SLP Diet Recommendations Dysphagia 3 (mechanical soft);Thin   Liquid Administration via Cup;No straw   Medication Administration Whole meds with puree   Supervision Patient able to self feed   Compensations Small sips/bites;Multiple dry  swallows after each bite/sip;Clear throat intermittently;Effortful swallow   Postural Changes Seated upright at 90 degrees;Remain upright for at least 30 minutes after feeds/meals          Prognosis - 12/18/16 1959      Prognosis   Prognosis for Safe Diet Advancement Fair   Barriers to Reach Goals Severity of deficits     Individuals Consulted   Consulted and Agree with Results and Recommendations Patient   Report Sent to  Referring physician      Problem List Patient Active Problem List   Diagnosis Date Noted  . Primary localized osteoarthrosis of right shoulder 06/21/2015  . S/P shoulder replacement 06/21/2015  . Dysphagia, idiopathic 11/29/2014  . Osteoarthritis of right knee 07/27/2014  . Postsurgical hypothyroidism 04/22/2014  . Syncope and collapse   . Anemia, normocytic normochromic 04/26/2012  . Gastroesophageal reflux disease   . Lymphocytic thyroiditis   . Hypertension 12/07/2011  . History of TIAs 12/07/2011  . Leg pain, bilateral 04/27/2011  . Hyperlipidemia 04/27/2011   Thank you,  Genene Churn, [place]  [facility] 12/18/2016, 8:02 PM  [place] [address] [place], [place], [address] Phone: [phone]   Fax:  [phone]  Name: ALASIA LANDHERR MRN: [id number] Date of Birth: May 09, 1929

## 2016-12-20 ENCOUNTER — Telehealth: Payer: Self-pay | Admitting: Gastroenterology

## 2016-12-20 ENCOUNTER — Other Ambulatory Visit: Payer: Self-pay

## 2016-12-20 DIAGNOSIS — R1310 Dysphagia, unspecified: Secondary | ICD-10-CM

## 2016-12-20 NOTE — Telephone Encounter (Signed)
Referral has been made.

## 2016-12-20 NOTE — Telephone Encounter (Signed)
Pt has an appointment with Dr.Madden on 01/01/17 @ 11:00. I called Dee from Radiology and she is going to mail a CD of the MBSS to [facility] to call with no answer

## 2016-12-20 NOTE — Telephone Encounter (Signed)
PLEASE CALL PT. I REVIEWED REPORT FROM DABNEY PORTER. SHE SHOULD SEE  Dr. Johnna Acosta (laryngologist) at [facility] who may consider dilation of UES. THIS COULD PROVIDE SOME relief OF YOUR SYMPTOMS.

## 2016-12-26 NOTE — Telephone Encounter (Signed)
Pt is aware.  

## 2016-12-28 DIAGNOSIS — J45909 Unspecified asthma, uncomplicated: Secondary | ICD-10-CM | POA: Diagnosis not present

## 2017-01-01 ENCOUNTER — Telehealth: Payer: Self-pay

## 2017-01-01 DIAGNOSIS — Z79899 Other long term (current) drug therapy: Secondary | ICD-10-CM | POA: Diagnosis not present

## 2017-01-01 DIAGNOSIS — R1312 Dysphagia, oropharyngeal phase: Secondary | ICD-10-CM | POA: Diagnosis not present

## 2017-01-01 DIAGNOSIS — R1313 Dysphagia, pharyngeal phase: Secondary | ICD-10-CM | POA: Diagnosis not present

## 2017-01-01 DIAGNOSIS — Z87898 Personal history of other specified conditions: Secondary | ICD-10-CM | POA: Diagnosis not present

## 2017-01-01 DIAGNOSIS — Z7982 Long term (current) use of aspirin: Secondary | ICD-10-CM | POA: Diagnosis not present

## 2017-01-01 DIAGNOSIS — J383 Other diseases of vocal cords: Secondary | ICD-10-CM | POA: Diagnosis not present

## 2017-01-01 DIAGNOSIS — R490 Dysphonia: Secondary | ICD-10-CM | POA: Diagnosis not present

## 2017-01-01 DIAGNOSIS — Z87891 Personal history of nicotine dependence: Secondary | ICD-10-CM | POA: Diagnosis not present

## 2017-01-01 DIAGNOSIS — Z888 Allergy status to other drugs, medicaments and biological substances status: Secondary | ICD-10-CM | POA: Diagnosis not present

## 2017-01-01 NOTE — Telephone Encounter (Signed)
[facility] called, they received referral info. Requested MBS results be mailed to [address], [place], [place] [address] and faxed. MBS results faxed to [phone]. [facility] and there are no films to be put on a disc (noted put on fax with results.)

## 2017-01-02 ENCOUNTER — Other Ambulatory Visit: Payer: Self-pay | Admitting: Cardiology

## 2017-01-10 DIAGNOSIS — J45909 Unspecified asthma, uncomplicated: Secondary | ICD-10-CM | POA: Diagnosis not present

## 2017-01-14 ENCOUNTER — Ambulatory Visit: Payer: Medicare Other | Admitting: Neurology

## 2017-01-21 DIAGNOSIS — R1312 Dysphagia, oropharyngeal phase: Secondary | ICD-10-CM | POA: Diagnosis not present

## 2017-01-22 DIAGNOSIS — L723 Sebaceous cyst: Secondary | ICD-10-CM | POA: Diagnosis not present

## 2017-01-22 DIAGNOSIS — L820 Inflamed seborrheic keratosis: Secondary | ICD-10-CM | POA: Diagnosis not present

## 2017-01-22 DIAGNOSIS — L700 Acne vulgaris: Secondary | ICD-10-CM | POA: Diagnosis not present

## 2017-01-22 DIAGNOSIS — L821 Other seborrheic keratosis: Secondary | ICD-10-CM | POA: Diagnosis not present

## 2017-01-24 ENCOUNTER — Other Ambulatory Visit: Payer: Self-pay | Admitting: Endocrinology

## 2017-01-24 ENCOUNTER — Other Ambulatory Visit: Payer: Self-pay

## 2017-01-24 DIAGNOSIS — E890 Postprocedural hypothyroidism: Secondary | ICD-10-CM | POA: Diagnosis not present

## 2017-01-24 DIAGNOSIS — J309 Allergic rhinitis, unspecified: Secondary | ICD-10-CM | POA: Diagnosis not present

## 2017-01-25 DIAGNOSIS — B351 Tinea unguium: Secondary | ICD-10-CM | POA: Diagnosis not present

## 2017-01-25 DIAGNOSIS — L851 Acquired keratosis [keratoderma] palmaris et plantaris: Secondary | ICD-10-CM | POA: Diagnosis not present

## 2017-01-25 DIAGNOSIS — G629 Polyneuropathy, unspecified: Secondary | ICD-10-CM | POA: Diagnosis not present

## 2017-01-25 LAB — T4, FREE: Free T4: 1.66 ng/dL (ref 0.82–1.77)

## 2017-01-25 LAB — TSH: TSH: 6.02 u[IU]/mL — AB (ref 0.450–4.500)

## 2017-01-25 MED ORDER — OMEPRAZOLE 20 MG PO CPDR: DELAYED_RELEASE_CAPSULE | ORAL | 5 refills | Status: DC

## 2017-01-31 ENCOUNTER — Ambulatory Visit (INDEPENDENT_AMBULATORY_CARE_PROVIDER_SITE_OTHER): Payer: Medicare Other | Admitting: Endocrinology

## 2017-01-31 ENCOUNTER — Encounter: Payer: Self-pay | Admitting: Endocrinology

## 2017-01-31 VITALS — BP 110/64 | HR 75 | Ht 62.0 in | Wt 118.0 lb

## 2017-01-31 DIAGNOSIS — E890 Postprocedural hypothyroidism: Secondary | ICD-10-CM

## 2017-01-31 MED ORDER — LEVOTHYROXINE SODIUM 88 MCG PO TABS: 88.0000 ug | ORAL_TABLET | Freq: Every day | ORAL | 3 refills | Status: DC

## 2017-01-31 NOTE — Progress Notes (Signed)
Christine Leblanc 83 y.o.    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   She previously had been on 100 mcg since 03/2014  Because of her confirmed iatrogenic hyperthyroidism on her visit on 07/30/16 her dose was reduced down to 75 g  She is asking about recent problems with hair loss Also may be more cold intolerance. She does have tiredness but not significantly different than before  Compliance with the medical regimen has been as prescribed with taking the tablet in the morning before breakfast.  Wt Readings from Last 3 Encounters:  01/31/17 118 lb (53.5 kg)  12/12/16 118 lb 3.2 oz (53.6 kg)  10/01/16 116 lb (52.6 kg)   Lab Results  Component Value Date   TSH 6.020 (H) 01/24/2017   TSH 0.84 10/01/2016   TSH 0.75 08/29/2016   FREET4 1.66 01/24/2017   FREET4 1.21 10/01/2016   FREET4 1.32 08/29/2016       Allergies as of 01/31/2017      Reactions   Celecoxib Shortness Of Breath   Dexilant [dexlansoprazole] Anaphylaxis   abd pain      Medication List       Accurate as of 01/31/17  1:12 PM. Always use your most recent med list.          amLODipine 2.5 MG tablet Commonly known as:  NORVASC Take 2.5 mg by mouth daily.   aspirin 81 MG tablet Take 81 mg by mouth daily.   calcium-vitamin D 500-200 MG-UNIT tablet Commonly known as:  OSCAL WITH D Take 1 tablet by mouth 2 (two) times daily.   diazepam 5 MG tablet Commonly known as:  VALIUM Take one tablet by mouth at bedtime as needed for sleep   Fish Oil 1200 MG Caps Take 1 capsule by mouth daily.    FLUZONE HIGH-DOSE 0.5 ML Susy Generic drug:  Influenza Vac Split High-Dose inject 0.5 milliliter intramuscularly   GLUCOSAMINE PO Take 1 tablet by mouth 2 (two) times daily.   levocetirizine 5 MG tablet Commonly known as:  XYZAL Take 5 mg by mouth every evening.   levothyroxine 75 MCG tablet Commonly known as:  SYNTHROID, LEVOTHROID Take 1 tablet (75 mcg total) by mouth daily.   montelukast 10 MG tablet Commonly known as:  SINGULAIR Take 10 mg by mouth at bedtime.   omeprazole 20 MG capsule Commonly known as:  PRILOSEC 1 PO 30 mins prior to breakfast and supper   PERI-COLACE PO Take 1 tablet by mouth 2 (two) times daily.   pravastatin 80 MG tablet Commonly known as:  PRAVACHOL TAKE 1 TABLET EVERY EVENING   PROVENTIL HFA 108 (90 Base) MCG/ACT inhaler Generic drug:  albuterol Inhale 1-2 puffs into the lungs every 6 (six) hours as needed for wheezing or shortness of breath. Wheezing, Asthma Symptoms   pyridOXINE 100 MG tablet Commonly known as:  VITAMIN B-6 Take 100 mg by mouth at bedtime.   vitamin B-12 1000 MCG tablet Commonly known as:  CYANOCOBALAMIN Take 1,000 mcg by mouth every morning.   VITAMIN C PO Take 1 tablet by mouth daily.  vitamin E 400 UNIT capsule Take 400 Units by mouth at bedtime.       Allergies:  Allergies  Allergen Reactions  . Celecoxib Shortness Of Breath  . Dexilant [Dexlansoprazole] Anaphylaxis    abd pain    Past Medical History:  Diagnosis Date  . Anemia   . Asthma   . Cancer ([place])    skin Ca- ? basal cell   . Chronic back pain   . Chronic neck pain   . Degenerative joint disease    Left shoulder; cervical spine, knees & hands   . Gastroesophageal reflux disease    Hiatal hernia; distal esophageal web requiring dilatation; gastric polyps; gastritis; refuses colonoscopy  . GERD (gastroesophageal reflux disease)   . History of stress test 1990's   stress test done under the care of Dr. Lattie Haw & Dr. Gwenlyn Found, now being  followed by Dr. Harl Bowie- in [place] , recently seen & told to f/U in one yr.   . Hyperlipidemia 04/27/2011  . Hypertension   . Hypothyroidism   . Lymphocytic thyroiditis   . Multiple thyroid nodules 2010   Adenomatous; thyroidectomy in 2010  . Osteoarthritis of right knee 07/27/2014  . Pneumonia    hosp. for pneumonia- long time ago   . Primary localized osteoarthrosis of right shoulder 06/21/2015  . Seizures ([place])    yes- as a child- & into adult years, states she took med. for them at one time, stopped at 32 yrs. of age   . Syncope and collapse 2007   Possible CVA in 2012 with left lower extremity weakness; refused hospitalization; CT-Atrophy and chronic microvascular ischemic change.     Past Surgical History:  Procedure Laterality Date  . ABDOMINAL HYSTERECTOMY     fibroids  . CATARACT EXTRACTION     Bilateral; redo surgery on the right for incomplete primary procedure  . COLONOSCOPY  Remote  . ESOPHAGOGASTRODUODENOSCOPY  12/10   Dr. 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 ESOPHAGOGASTRODUODENOSCOPY N/A 03/21/2015   Procedure: ESOPHAGOGASTRODUODENOSCOPY (EGD);  Surgeon: Danie Binder, MD;  [facility];  Service: Endoscopy;  Laterality: N/A;  1130   . EYE SURGERY    . PROLAPSED UTERINE FIBROID LIGATION     outcomed with rectocele & cystocele  . SAVORY DILATION N/A 03/21/2015   Procedure: SAVORY DILATION;  Surgeon: Danie Binder, MD;  [facility];  Service: Endoscopy;  Laterality: N/A;  . SHOULDER SURGERY     left  . TOTAL KNEE ARTHROPLASTY Right 07/27/2014   Procedure: RIGHT TOTAL KNEE ARTHROPLASTY;  Surgeon: Johnny Bridge, MD;  Location: [facility];  Service: Orthopedics;  Laterality: Right;  . TOTAL SHOULDER ARTHROPLASTY Right 06/21/2015   Procedure: RIGHT TOTAL SHOULDER ARTHROPLASTY;  Surgeon: Marchia Bond, MD;  Location: [facility];  Service: Orthopedics;  Laterality: Right;  . TOTAL THYROIDECTOMY  2010     Family History  Problem Relation Age of Onset  . Heart disease Mother   . Gallbladder disease Mother   . Breast cancer Sister   . Colon cancer Neg Hx   . Stroke Neg Hx     Social History:  reports that she quit smoking about 38 years ago. Her smoking use included Cigarettes. She has a 16.00 pack-year smoking history. She has never used smokeless tobacco. She reports that she does not drink alcohol or use drugs.  REVIEW Of SYSTEMS:  Review of Systems  Gastrointestinal:       Dysphagia +  Weight History:   Wt Readings from Last 3 Encounters:  01/31/17 118 lb (53.5 kg)  12/12/16 118 lb 3.2 oz (53.6 kg)  10/01/16 116 lb (52.6 kg)    History of hypercholesterolemia followed by PCP     Examination:   BP 110/64   Pulse 75   Ht 5\' 2"  (1.575 m)   Wt 118 lb (53.5 kg)   SpO2 94%   BMI 21.58 kg/m     Biceps reflexes appear slow No edema    Assessments   Hypothyroidism, postsurgical with previously stable supplement those those of levothyroxine Although previously had normal TSH with reducing her dose from 100 down to 75 g she is now having some symptoms of fatigue, hair loss and cold intolerance.  Reflexes appear slowed today  Also TSH is relatively higher at 6. This is despite no change in her weight and good compliance with her medication Not taking any interactive substances at the same time, Has been taking Prilosec long-term  She will need to go up to 88 g. However since she just got a refill of her prescription she can continue to use the 75 g but add an extra tablet weekly    Patient Instructions  Take extra pill evey Friday and 1 only other days    Christine Leblanc 01/31/2017, 1:12 PM

## 2017-01-31 NOTE — Patient Instructions (Signed)
Take extra pill evey Friday and 1 only other days

## 2017-02-05 ENCOUNTER — Telehealth: Payer: Self-pay | Admitting: Gastroenterology

## 2017-02-05 NOTE — Telephone Encounter (Signed)
Pt called to ask if we could call in a early refill for her Omeprazole, because she is running out and the last refill was never delivered to her house or either she misplaced the bottle and can't find it. She wants it for a 3 month supply with 3 refills. She uses [facility].

## 2017-02-05 NOTE — Telephone Encounter (Signed)
Forwarding to the refill box.  

## 2017-02-06 MED ORDER — OMEPRAZOLE 20 MG PO CPDR: DELAYED_RELEASE_CAPSULE | ORAL | 3 refills | Status: DC

## 2017-02-06 NOTE — Addendum Note (Signed)
Addended by: Mahala Menghini on: 02/06/2017 09:36 PM   Modules accepted: Orders

## 2017-02-06 NOTE — Telephone Encounter (Signed)
done

## 2017-02-08 DIAGNOSIS — J309 Allergic rhinitis, unspecified: Secondary | ICD-10-CM | POA: Diagnosis not present

## 2017-02-15 ENCOUNTER — Telehealth: Payer: Self-pay | Admitting: Endocrinology

## 2017-02-15 NOTE — Telephone Encounter (Signed)
Patient ask you to call her concerning her medication

## 2017-02-19 ENCOUNTER — Ambulatory Visit (HOSPITAL_COMMUNITY)
Admission: RE | Admit: 2017-02-19 | Discharge: 2017-02-19 | Disposition: A | Discharge status: Home / Self Care | Payer: Medicare Other | Source: Ambulatory Visit | Attending: Registered Nurse | Admitting: Registered Nurse

## 2017-02-19 ENCOUNTER — Other Ambulatory Visit (HOSPITAL_COMMUNITY): Payer: Self-pay | Admitting: Registered Nurse

## 2017-02-19 DIAGNOSIS — M8580 Other specified disorders of bone density and structure, unspecified site: Secondary | ICD-10-CM | POA: Insufficient documentation

## 2017-02-19 DIAGNOSIS — Z6821 Body mass index (BMI) 21.0-21.9, adult: Secondary | ICD-10-CM | POA: Diagnosis not present

## 2017-02-19 DIAGNOSIS — M47892 Other spondylosis, cervical region: Secondary | ICD-10-CM | POA: Diagnosis not present

## 2017-02-19 DIAGNOSIS — M542 Cervicalgia: Secondary | ICD-10-CM | POA: Diagnosis not present

## 2017-02-19 DIAGNOSIS — M1991 Primary osteoarthritis, unspecified site: Secondary | ICD-10-CM | POA: Diagnosis not present

## 2017-02-19 DIAGNOSIS — S199XXA Unspecified injury of neck, initial encounter: Secondary | ICD-10-CM | POA: Diagnosis not present

## 2017-02-19 DIAGNOSIS — M47814 Spondylosis without myelopathy or radiculopathy, thoracic region: Secondary | ICD-10-CM | POA: Diagnosis not present

## 2017-02-19 DIAGNOSIS — M47894 Other spondylosis, thoracic region: Secondary | ICD-10-CM | POA: Insufficient documentation

## 2017-02-19 DIAGNOSIS — E782 Mixed hyperlipidemia: Secondary | ICD-10-CM | POA: Diagnosis not present

## 2017-02-21 NOTE — Telephone Encounter (Signed)
Called pt to get further details, pt has been waking up in the middle of the night from sweating but she thinks its just from being hot, she has no other concerns at this time.

## 2017-02-25 DIAGNOSIS — R1312 Dysphagia, oropharyngeal phase: Secondary | ICD-10-CM | POA: Diagnosis not present

## 2017-03-01 DIAGNOSIS — J309 Allergic rhinitis, unspecified: Secondary | ICD-10-CM | POA: Diagnosis not present

## 2017-03-04 ENCOUNTER — Telehealth: Payer: Self-pay | Admitting: Internal Medicine

## 2017-03-04 DIAGNOSIS — R1312 Dysphagia, oropharyngeal phase: Secondary | ICD-10-CM | POA: Diagnosis not present

## 2017-03-04 NOTE — Telephone Encounter (Signed)
Pt called to request a new prescription for L-thyroxine med tabs  Stronger than 75 mg in a [facility] mo supply to [facility]. Pt said the dosage was discussed being changed.

## 2017-03-05 ENCOUNTER — Other Ambulatory Visit: Payer: Self-pay

## 2017-03-05 MED ORDER — LEVOTHYROXINE SODIUM 88 MCG PO TABS: 88.0000 ug | ORAL_TABLET | Freq: Every day | ORAL | 3 refills | Status: DC

## 2017-03-05 NOTE — Telephone Encounter (Signed)
Ordered

## 2017-03-15 DIAGNOSIS — J309 Allergic rhinitis, unspecified: Secondary | ICD-10-CM | POA: Diagnosis not present

## 2017-04-05 DIAGNOSIS — J45909 Unspecified asthma, uncomplicated: Secondary | ICD-10-CM | POA: Diagnosis not present

## 2017-04-11 ENCOUNTER — Ambulatory Visit: Payer: Medicare Other | Admitting: Neurology

## 2017-04-12 DIAGNOSIS — B351 Tinea unguium: Secondary | ICD-10-CM | POA: Diagnosis not present

## 2017-04-12 DIAGNOSIS — L851 Acquired keratosis [keratoderma] palmaris et plantaris: Secondary | ICD-10-CM | POA: Diagnosis not present

## 2017-04-12 DIAGNOSIS — E1342 Other specified diabetes mellitus with diabetic polyneuropathy: Secondary | ICD-10-CM | POA: Diagnosis not present

## 2017-04-22 ENCOUNTER — Telehealth: Payer: Self-pay | Admitting: Endocrinology

## 2017-04-22 NOTE — Telephone Encounter (Signed)
This has been done.

## 2017-04-22 NOTE — Telephone Encounter (Signed)
Patient need lab order sent to [facility] [phone]

## 2017-04-22 NOTE — Telephone Encounter (Signed)
Send TSH, free T4

## 2017-04-24 DIAGNOSIS — I739 Peripheral vascular disease, unspecified: Secondary | ICD-10-CM | POA: Diagnosis not present

## 2017-04-26 DIAGNOSIS — J309 Allergic rhinitis, unspecified: Secondary | ICD-10-CM | POA: Diagnosis not present

## 2017-04-30 ENCOUNTER — Encounter: Payer: Self-pay | Admitting: Gastroenterology

## 2017-05-01 ENCOUNTER — Ambulatory Visit (INDEPENDENT_AMBULATORY_CARE_PROVIDER_SITE_OTHER): Payer: Medicare Other | Admitting: Endocrinology

## 2017-05-01 ENCOUNTER — Encounter: Payer: Self-pay | Admitting: Endocrinology

## 2017-05-01 VITALS — BP 118/64 | HR 83 | Ht 62.0 in | Wt 113.6 lb

## 2017-05-01 DIAGNOSIS — E890 Postprocedural hypothyroidism: Secondary | ICD-10-CM | POA: Diagnosis not present

## 2017-05-01 LAB — T4, FREE: Free T4: 1.75 ng/dL — ABNORMAL HIGH (ref 0.60–1.60)

## 2017-05-01 LAB — TSH: TSH: 0.13 u[IU]/mL — AB (ref 0.35–4.50)

## 2017-05-01 NOTE — Progress Notes (Signed)
Please call to let patient know that the thyroid level is now high and she needs to go back to the 75 g dose of levothyroxine, follow-up in 2 months again

## 2017-05-01 NOTE — Progress Notes (Signed)
Christine Leblanc 83 y.o.    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   She previously had been on 100 mcg since 03/2014 This was subsequently reduced down to 75 g  However she was having complaints of fatigue, hair loss and cold intolerance in 2/18 and her TSH was high at 6.0  With increasing her dose up to 88 g she is feeling less tired, not having as much her loss She has mild chronic cold intolerance  Compliance with the medical regimen has been as prescribed with taking the tablet in the morning before breakfast.  Wt Readings from Last 3 Encounters:  05/01/17 113 lb 9.6 oz (51.5 kg)  01/31/17 118 lb (53.5 kg)  12/12/16 118 lb 3.2 oz (53.6 kg)   Lab Results  Component Value Date   TSH 6.020 (H) 01/24/2017   TSH 0.84 10/01/2016   TSH 0.75 08/29/2016   FREET4 1.66 01/24/2017   FREET4 1.21 10/01/2016   FREET4 1.32 08/29/2016       Allergies as of 05/01/2017      Reactions   Celecoxib Shortness Of Breath   Dexilant [dexlansoprazole] Anaphylaxis   abd pain      Medication List       Accurate as of 05/01/17  1:28 PM. Always use your most recent med list.          amLODipine 2.5 MG tablet Commonly known as:  NORVASC Take 2.5 mg by mouth daily.   aspirin 81 MG tablet Take 81 mg by mouth daily.   calcium-vitamin D 500-200 MG-UNIT tablet Commonly known as:  OSCAL WITH D Take 1 tablet by mouth 2 (two) times daily.   diazepam 5 MG tablet Commonly known as:  VALIUM Take one tablet by mouth at bedtime as needed for sleep   Fish Oil 1200 MG Caps Take 1  capsule by mouth daily.   FLUZONE HIGH-DOSE 0.5 ML Susy Generic drug:  Influenza Vac Split High-Dose inject 0.5 milliliter intramuscularly   GLUCOSAMINE PO Take 1 tablet by mouth 2 (two) times daily.   levocetirizine 5 MG tablet Commonly known as:  XYZAL Take 5 mg by mouth every evening.   levothyroxine 88 MCG tablet Commonly known as:  SYNTHROID, LEVOTHROID Take 1 tablet (88 mcg total) by mouth daily.   montelukast 10 MG tablet Commonly known as:  SINGULAIR Take 10 mg by mouth at bedtime.   omeprazole 20 MG capsule Commonly known as:  PRILOSEC 1 PO 30 mins prior to breakfast and supper   PERI-COLACE PO Take 1 tablet by mouth 2 (two) times daily.   pravastatin 80 MG tablet Commonly known as:  PRAVACHOL TAKE 1 TABLET EVERY EVENING   PROVENTIL HFA 108 (90 Base) MCG/ACT inhaler Generic drug:  albuterol Inhale 1-2 puffs into the lungs every 6 (six) hours as needed for wheezing or shortness of breath. Wheezing, Asthma Symptoms   pyridOXINE 100 MG tablet Commonly known as:  VITAMIN B-6 Take 100 mg by mouth at bedtime.   vitamin B-12 1000 MCG tablet Commonly known as:  CYANOCOBALAMIN Take 1,000 mcg by mouth every morning.  VITAMIN C PO Take 1 tablet by mouth daily.   vitamin E 400 UNIT capsule Take 400 Units by mouth at bedtime.       Allergies:  Allergies  Allergen Reactions  . Celecoxib Shortness Of Breath  . Dexilant [Dexlansoprazole] Anaphylaxis    abd pain    Past Medical History:  Diagnosis Date  . Anemia   . Asthma   . Cancer ([place])    skin Ca- ? basal cell   . Chronic back pain   . Chronic neck pain   . Degenerative joint disease    Left shoulder; cervical spine, knees & hands   . Gastroesophageal reflux disease    Hiatal hernia; distal esophageal web requiring dilatation; gastric polyps; gastritis; refuses colonoscopy  . GERD (gastroesophageal reflux disease)   . History of stress test 1990's   stress test done under the care of Dr.  Lattie Haw & Dr. Gwenlyn Found, now being followed by Dr. Harl Bowie- in [place] , recently seen & told to f/U in one yr.   . Hyperlipidemia 04/27/2011  . Hypertension   . Hypothyroidism   . Lymphocytic thyroiditis   . Multiple thyroid nodules 2010   Adenomatous; thyroidectomy in 2010  . Osteoarthritis of right knee 07/27/2014  . Pneumonia    hosp. for pneumonia- long time ago   . Primary localized osteoarthrosis of right shoulder 06/21/2015  . Seizures ([place])    yes- as a child- & into adult years, states she took med. for them at one time, stopped at 32 yrs. of age   . Syncope and collapse 2007   Possible CVA in 2012 with left lower extremity weakness; refused hospitalization; CT-Atrophy and chronic microvascular ischemic change.     Past Surgical History:  Procedure Laterality Date  . ABDOMINAL HYSTERECTOMY     fibroids  . CATARACT EXTRACTION     Bilateral; redo surgery on the right for incomplete primary procedure  . COLONOSCOPY  Remote  . ESOPHAGOGASTRODUODENOSCOPY  12/10   Dr. 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 ESOPHAGOGASTRODUODENOSCOPY N/A 03/21/2015   Procedure: ESOPHAGOGASTRODUODENOSCOPY (EGD);  Surgeon: Danie Binder, MD;  [facility];  Service: Endoscopy;  Laterality: N/A;  1130   . EYE SURGERY    . PROLAPSED UTERINE FIBROID LIGATION     outcomed with rectocele & cystocele  . SAVORY DILATION N/A 03/21/2015   Procedure: SAVORY DILATION;  Surgeon: Danie Binder, MD;  [facility];  Service: Endoscopy;  Laterality: N/A;  . SHOULDER SURGERY     left  . TOTAL KNEE ARTHROPLASTY Right 07/27/2014   Procedure: RIGHT TOTAL KNEE ARTHROPLASTY;  Surgeon: Johnny Bridge, MD;  Location: [facility];  Service: Orthopedics;  Laterality: Right;  . TOTAL SHOULDER ARTHROPLASTY Right 06/21/2015   Procedure: RIGHT TOTAL SHOULDER ARTHROPLASTY;  Surgeon: Marchia Bond, MD;  Location: [facility];  Service: Orthopedics;  Laterality:  Right;  . TOTAL THYROIDECTOMY  2010    Family History  Problem Relation Age of Onset  . Heart disease Mother   . Gallbladder disease Mother   . Breast cancer Sister   . Colon cancer Neg Hx   . Stroke Neg Hx     Social History:  reports that she quit smoking about 38 years ago. Her smoking use included Cigarettes. She has a 16.00 pack-year smoking history. She has never used smokeless tobacco. She reports that she does not drink alcohol or use drugs.  REVIEW Of SYSTEMS:  Review of  Systems  Gastrointestinal:       Dysphagia +   Weight History:   Wt Readings from Last 3 Encounters:  05/01/17 113 lb 9.6 oz (51.5 kg)  01/31/17 118 lb (53.5 kg)  12/12/16 118 lb 3.2 oz (53.6 kg)    History of hypercholesterolemia followed by PCP     Examination:   BP 118/64   Pulse 83   Ht 5\' 2"  (1.575 m)   Wt 113 lb 9.6 oz (51.5 kg)   SpO2 97%   BMI 20.78 kg/m     Biceps reflexes Are slightly slow No edema    Assessments   Hypothyroidism, postsurgical with previously stable supplement those those of levothyroxine Her dose was increased up to 88 g and she feels subjectively better Her weight is related to her overall intake of food Appears euthyroid today  Will check her labs today and decide on further follow-up  Advised her to discuss management of her B12 deficiency with PCP   There are no Patient Instructions on file for this visit.   Christine Leblanc 05/01/2017, 1:28 PM

## 2017-05-01 NOTE — Addendum Note (Signed)
Addended by: Kaylyn Lim I on: 05/01/2017 01:31 PM   Modules accepted: Orders

## 2017-05-03 ENCOUNTER — Other Ambulatory Visit: Payer: Self-pay

## 2017-05-03 MED ORDER — LEVOTHYROXINE SODIUM 75 MCG PO TABS: 75.0000 ug | ORAL_TABLET | Freq: Every day | ORAL | 3 refills | Status: DC

## 2017-05-07 DIAGNOSIS — H21233 Degeneration of iris (pigmentary), bilateral: Secondary | ICD-10-CM | POA: Diagnosis not present

## 2017-05-07 DIAGNOSIS — H4322 Crystalline deposits in vitreous body, left eye: Secondary | ICD-10-CM | POA: Diagnosis not present

## 2017-05-07 DIAGNOSIS — H52203 Unspecified astigmatism, bilateral: Secondary | ICD-10-CM | POA: Diagnosis not present

## 2017-05-07 DIAGNOSIS — H40013 Open angle with borderline findings, low risk, bilateral: Secondary | ICD-10-CM | POA: Diagnosis not present

## 2017-05-16 ENCOUNTER — Ambulatory Visit (INDEPENDENT_AMBULATORY_CARE_PROVIDER_SITE_OTHER): Payer: Medicare Other | Admitting: Otolaryngology

## 2017-05-16 DIAGNOSIS — H903 Sensorineural hearing loss, bilateral: Secondary | ICD-10-CM | POA: Diagnosis not present

## 2017-05-20 NOTE — Telephone Encounter (Signed)
Patient called back again.

## 2017-05-20 NOTE — Telephone Encounter (Signed)
Patient is returning your call.  

## 2017-05-20 NOTE — Telephone Encounter (Signed)
Called patient back and she accidentally thought our office had called but it was a call from a different doctor. She did ask if she was suppose to take the 58mcg or 48mcg of the Levothyroxine and I let her know it was the 75 mcg.

## 2017-05-21 ENCOUNTER — Ambulatory Visit (INDEPENDENT_AMBULATORY_CARE_PROVIDER_SITE_OTHER): Payer: Medicare Other | Admitting: Neurology

## 2017-05-21 ENCOUNTER — Encounter: Payer: Self-pay | Admitting: Neurology

## 2017-05-21 VITALS — BP 139/67 | HR 75 | Ht 62.0 in | Wt 114.0 lb

## 2017-05-21 DIAGNOSIS — G1220 Motor neuron disease, unspecified: Secondary | ICD-10-CM | POA: Diagnosis not present

## 2017-05-21 DIAGNOSIS — R1310 Dysphagia, unspecified: Secondary | ICD-10-CM | POA: Diagnosis not present

## 2017-05-21 NOTE — Patient Instructions (Addendum)
Remember to drink plenty of fluid, eat healthy meals and do not skip any meals. Try to eat protein with a every meal and eat a healthy snack such as fruit or nuts in between meals. Try to keep a regular sleep-wake schedule and try to exercise daily, particularly in the form of walking, 20-30 minutes a day, if you can.   As far as diagnostic testing: Imaging of brain and neck, Labs, emg/ncs  I would like to see you back for emg/ncs, sooner if we need to. Please call us with any interim questions, concerns, problems, updates or refill requests.   Our phone number is [phone]. We also have an after hours call service for urgent matters and there is a physician on-call for urgent questions. For any emergencies you know to call 911 or go to the nearest emergency room

## 2017-05-21 NOTE — Progress Notes (Signed)
[facility]    Provider:  Dr Jaynee Eagles Referring Provider: Redmond School, MD Primary Care Physician:  Redmond School, MD  CC:  Dysphagia, swallowing problems and left sided weakness  Interval history 05/21/2017. Patient was seen almost a year ago for dysphasia. Workup was largely unremarkable including lab testing and MRI of the brain. She was asked to follow up in 3 months but has not followed up for a year. Her dysphagia is stable, no worsening, in fact possibly improved. She is drooling form time to time. She denies fasciculations. She denies weakness. She denies weight loss. She went to speech therapy last in march this year. No neck pain.   MRi brain 06/2016:  This MRI of the brain without contrast shows the following: 1.    Chronic lacunar infarction involving the right putamen and adjacent posterior limb of the internal capsule. This was noted on CT scan from 08/02/2012 2.    Extensive T2/FLAIR hyperintense foci consistent with advanced chronic microvascular ischemic change.  Only mild changes are noted within the right pons.  A similar extent of hemispheric changes is seen on CT scan from 2013. 3.    Mild cortical atrophy most pronounced in the parietal lobes. 4.     There is asymmetry of signal within the basal ganglia on susceptibility weighted images.    The significance of this finding is unclear and most likely represents asymmetry of iron deposition. It is unlikely to represent sequela of prior hemorrhage.     HPI:  Christine Leblanc is a 83 y.o. female here as a referral from Dr. Ramond Marrow . PMHx of dysphagia, former smoker, total thyroidectomy in 2010, distal esophageal web requiring dilation. Thin liquids are symptomatic. She has Dysphagia requiring mechanical soft nectar thick liquids. Swallowing problems started 40 years ago, she remembers being at her kitchen sink eating smoked sausage and choking. But she hadn't had a problem for decades until   having dentures put in which impairs chewing. She feels her dentures make it difficult to talk. Dysphasia worsening since Christmas. Patient thinks she may have had a stroke in the past. Started at Christmas or longer. Chewing is difficult and it takes her forever to chew since her dentures, she avoids things that are hard to chew. Occ she thinks she swallows something wrong. Main problems is liquids. She has a drippy nose. Sometimes coffee comes out her nose. 20 years ago she thinks maybe she had a stroke, she was at church and she was saying something and she suddenly felt weak and she felt clammy. She went to the doctor and  she thinks she had a stroke but the doctors were not sure. She has never noticed any weakness. The swallowing difficulty is continuous, not dependent on the time of day. If she is upset she is not so careful. No FHx of neuromuscular disorders. No tremor. No diplopia. No ptosis, no headache. She does not notice any muscle jumping and no recent weight or muscle loss. No changes in voice quality. She has a lot of joint problems, Dr. Mardelle Matte replaced shoulder and knee joints. No neck pain. Grandfather had Parkinson's disease. She denies any memory changes. No problems with breathing. No difficulty with walking. No other new symptoms since December.  Reviewed notes, labs and imaging from outside physicians, which showed: TSH normal 4.47 February 2017. CBC in June 2016 showed anemia 9.4 with global and 27.9 hematocrit in 132 platelets, BMP was unremarkable.  CT of the head 11/2011: Personally reviewed  and agree with the following  Findings: Age appropriate atrophy. Patchy hypodensity in the cerebral white matter bilaterally is unchanged from the prior study and compatible with chronic microvascular ischemia. No definite acute infarct. Negative for hemorrhage or mass lesion. Calvarium is intact.  Chronic sinusitis.  IMPRESSION: Atrophy and chronic microvascular ischemic change.  No acute infarct or hemorrhage.  Review of Systems: Patient complains of symptoms per HPI as well as the following symptoms: fatigue, cough, hearing loss, trouble swallowing, cramps, allergies, runny nose, restless legs, decreased energy. Pertinent negatives per HPI. All others negative.    Social History   Social History  . Marital status: Widowed    Spouse name: N/A  . Number of children: 4  . Years of education: 41   Occupational History  . 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 Retired     Marine scientist   Social History Main Topics  . Smoking status: Former Smoker    Packs/day: 0.80    Years: 20.00    Types: Cigarettes    Quit date: 12/31/1978  . Smokeless tobacco: Never Used  . Alcohol use No  . Drug use: No  . Sexual activity: Not on file   Other Topics Concern  . Not on file   Social History Narrative   Lives with daughter   Caffeine use: 1-2 cups coffee per day       Family History  Problem Relation Age of Onset  . Heart disease Mother   . Gallbladder disease Mother   . Breast cancer Sister   . Colon cancer Neg Hx   . Stroke Neg Hx     Past Medical History:  Diagnosis Date  . Anemia   . Asthma   . Cancer ([place])    skin Ca- ? basal cell   . Chronic back pain   . Chronic neck pain   . Degenerative joint disease    Left shoulder; cervical spine, knees & hands   . Gastroesophageal reflux disease    Hiatal hernia; distal esophageal web requiring dilatation; gastric polyps; gastritis; refuses colonoscopy  . GERD (gastroesophageal reflux disease)   . History of stress test 1990's   stress test done under the care of Dr. Lattie Haw & Dr. Gwenlyn Found, now being followed by Dr. Harl Bowie- in [place] , recently seen & told to f/U in one yr.   . Hyperlipidemia 04/27/2011  . Hypertension   . Hypothyroidism   . Lymphocytic thyroiditis   . Multiple thyroid nodules 2010   Adenomatous; thyroidectomy in 2010  . Osteoarthritis of right knee 07/27/2014  . Pneumonia     hosp. for pneumonia- long time ago   . Primary localized osteoarthrosis of right shoulder 06/21/2015  . Seizures ([place])    yes- as a child- & into adult years, states she took med. for them at one time, stopped at 32 yrs. of age   . Syncope and collapse 2007   Possible CVA in 2012 with left lower extremity weakness; refused hospitalization; CT-Atrophy and chronic microvascular ischemic change.     Past Surgical History:  Procedure Laterality Date  . ABDOMINAL HYSTERECTOMY     fibroids  . CATARACT EXTRACTION     Bilateral; redo surgery on the right for incomplete primary procedure  . COLONOSCOPY  Remote  . ESOPHAGOGASTRODUODENOSCOPY  12/10   Dr. 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 ESOPHAGOGASTRODUODENOSCOPY N/A 03/21/2015   Procedure: ESOPHAGOGASTRODUODENOSCOPY (EGD);  Surgeon: Danie Binder,  MD;  [facility];  Service: Endoscopy;  Laterality: N/A;  1130   . EYE SURGERY    . PROLAPSED UTERINE FIBROID LIGATION     outcomed with rectocele & cystocele  . SAVORY DILATION N/A 03/21/2015   Procedure: SAVORY DILATION;  Surgeon: Danie Binder, MD;  [facility];  Service: Endoscopy;  Laterality: N/A;  . SHOULDER SURGERY     left  . TOTAL KNEE ARTHROPLASTY Right 07/27/2014   Procedure: RIGHT TOTAL KNEE ARTHROPLASTY;  Surgeon: Johnny Bridge, MD;  Location: [facility];  Service: Orthopedics;  Laterality: Right;  . TOTAL SHOULDER ARTHROPLASTY Right 06/21/2015   Procedure: RIGHT TOTAL SHOULDER ARTHROPLASTY;  Surgeon: Marchia Bond, MD;  Location: [facility];  Service: Orthopedics;  Laterality: Right;  . TOTAL THYROIDECTOMY  2010    Current Outpatient Prescriptions  Medication Sig Dispense Refill  . albuterol (PROVENTIL HFA) 108 (90 BASE) MCG/ACT inhaler Inhale 1-2 puffs into the lungs every 6 (six) hours as needed for wheezing or shortness of breath. Wheezing, Asthma Symptoms    . amLODipine (NORVASC) 2.5 MG tablet Take 2.5  mg by mouth daily.    . Ascorbic Acid (VITAMIN C PO) Take 1 tablet by mouth daily.    Marland Kitchen aspirin 81 MG tablet Take 81 mg by mouth daily.    . calcium-vitamin D (OSCAL WITH D) 500-200 MG-UNIT per tablet Take 1 tablet by mouth 2 (two) times daily.     . diazepam (VALIUM) 5 MG tablet Take one tablet by mouth at bedtime as needed for sleep (Patient taking differently: Take 2.5-5 mg by mouth at bedtime as needed (sleep). ) 30 tablet 5  . Glucosamine HCl (GLUCOSAMINE PO) Take 1 tablet by mouth 2 (two) times daily.    Marland Kitchen ipratropium (ATROVENT) 0.03 % nasal spray     . levocetirizine (XYZAL) 5 MG tablet Take 5 mg by mouth every evening.      Marland Kitchen levothyroxine (SYNTHROID, LEVOTHROID) 75 MCG tablet Take 1 tablet (75 mcg total) by mouth daily. 90 tablet 3  . montelukast (SINGULAIR) 10 MG tablet Take 10 mg by mouth at bedtime.    Marland Kitchen omeprazole (PRILOSEC) 20 MG capsule 1 PO 30 mins prior to breakfast and supper 180 capsule 3  . pravastatin (PRAVACHOL) 80 MG tablet TAKE 1 TABLET EVERY EVENING 90 tablet 2  . pyridOXINE (VITAMIN B-6) 100 MG tablet Take 100 mg by mouth at bedtime.    Orlie Dakin Sodium (PERI-COLACE PO) Take 1 tablet by mouth 2 (two) times daily.     . vitamin B-12 (CYANOCOBALAMIN) 1000 MCG tablet Take 1,000 mcg by mouth every morning.    . vitamin E 400 UNIT capsule Take 400 Units by mouth at bedtime.      No current facility-administered medications for this visit.     Allergies as of 05/21/2017 - Review Complete 05/21/2017  Allergen Reaction Noted  . Celecoxib Shortness Of Breath   . Dexilant [dexlansoprazole] Anaphylaxis 05/04/2016    Vitals: BP 139/67   Pulse 75   Ht '5\' 2"'  (1.575 m)   Wt 114 lb (51.7 kg)   BMI 20.85 kg/m  Last Weight:  Wt Readings from Last 1 Encounters:  05/21/17 114 lb (51.7 kg)   Last Height:   Ht Readings from Last 1 Encounters:  05/21/17 '5\' 2"'  (1.575 m)   Cranial Nerves:    The pupils are equal, round, and reactive to light. Attempted  funduscopic exam but could not visualize. Visual fields are full to  finger confrontation. Extraocular movements are intact. Trigeminal sensation is intact. Strong mouth and eye closure but can't blow out cheeks and weakness on tongue protrusion The face is symmetric. The palate elevates in the midline. Hearing intact. Voice is normal. Shoulder shrug is normal. The tongue has normal motion without fasciculations.  Good mouth closure strength but unable to puff out cheeks, good eye closure strength.   Coordination:    Normal finger to nose and heel to shin.   Gait:    Mildly stooped, can walk on heels and toes if you hold her hands, good arm swing, slow but not ataxic gait.   Motor Observation:    No asymmetry, has generalized atrophy, and no involuntary movements noted. Tone:    Normal muscle tone.    Posture:    Posture is normal. normal erect    Strength:4/5 deltoid and triceps weakness bilaterally, intact biceps, weakness of the intrinsic hand muscles with distal wasting, 4/5 bilat hip flexion weakness otherwise intact.      Sensation: intact to LT     Reflex Exam:  DTR's: Absent AJs otherwise deep tendon reflexes in the upper and lower extremities are brisk  bilaterally.   Toes:    The toes are downgoing bilaterally.   Clonus:    Clonus is absent     Assessment/Plan:  83 y.o. female here as a referral from Dr. Gerarda Fraction for dysphagia . PMHx of dysphagia, former smoker, total thyroidectomy in 2010, distal esophageal web requiring dilation. No suggestion, in the history or on exam of Parkinson's disease, myasthenia gravis which are neurologic conditions that can cause dysphagia. Mini-Mental status exam was 30 out of 30 so less likely dementia.   No significant progression for a year making motor neuron disease less likely but does have progressive significant bulbar weakness. If she had bulbar onset ALS would expect more progression in the last year but can't rule it out. No  fasciculations noted. Needs further imaging and emg/ncs. A year ago was 116 pounds and today is 114 pounds. No significant changes in muscle wasting or weakness however she does have diffuse atrophy.  Needs emg/ncs to eval for motor neuron disease, muscular dystrophy. Lab testing.  Repeat MRI brain w/wo contrast and also MRI cervical spine to evaluate for any signs of motor neuron disease upper motor neuron dysfunction due to bulbar weakness and progressive dysphasia. Follow up at emg/ncs. One arm and one leg and may proceed to 4-limb pending results.  Orders Placed This Encounter  Procedures  . MR BRAIN W WO CONTRAST  . MR CERVICAL SPINE WO CONTRAST  . Acetylcholine receptor, binding  . Acetylcholine receptor, blocking  . Acetylcholine receptor, modulating  . CK  . Basic Metabolic Panel  . Multiple Myeloma Panel (SPEP&IFE w/QIG)  . B. burgdorfi Antibody  . Heavy metals, blood  . NCV with EMG(electromyography)    Sarina Ill, MD  [facility] [address] [place], [place] [address]  Phone [phone] Fax [phone]  A total of 40 minutes was spent face-to-face with this patient. Over half this time was spent on counseling patient on the progressive dysphasia diagnosis and different diagnostic and therapeutic options available.

## 2017-05-22 DIAGNOSIS — J309 Allergic rhinitis, unspecified: Secondary | ICD-10-CM | POA: Diagnosis not present

## 2017-05-24 DIAGNOSIS — J4530 Mild persistent asthma, uncomplicated: Secondary | ICD-10-CM | POA: Diagnosis not present

## 2017-05-24 DIAGNOSIS — J3089 Other allergic rhinitis: Secondary | ICD-10-CM | POA: Diagnosis not present

## 2017-05-24 DIAGNOSIS — K219 Gastro-esophageal reflux disease without esophagitis: Secondary | ICD-10-CM | POA: Diagnosis not present

## 2017-05-24 DIAGNOSIS — J300 Vasomotor rhinitis: Secondary | ICD-10-CM | POA: Diagnosis not present

## 2017-05-29 ENCOUNTER — Encounter: Payer: Self-pay | Admitting: Gastroenterology

## 2017-05-29 ENCOUNTER — Ambulatory Visit (INDEPENDENT_AMBULATORY_CARE_PROVIDER_SITE_OTHER): Payer: Medicare Other | Admitting: Gastroenterology

## 2017-05-29 VITALS — BP 103/65 | HR 87 | Temp 97.3°F | Ht 62.0 in | Wt 112.0 lb

## 2017-05-29 DIAGNOSIS — K219 Gastro-esophageal reflux disease without esophagitis: Secondary | ICD-10-CM

## 2017-05-29 DIAGNOSIS — R1310 Dysphagia, unspecified: Secondary | ICD-10-CM | POA: Diagnosis not present

## 2017-05-29 LAB — MULTIPLE MYELOMA PANEL, SERUM
ALBUMIN/GLOB SERPL: 1 (ref 0.7–1.7)
ALPHA 1: 0.3 g/dL (ref 0.0–0.4)
Albumin SerPl Elph-Mcnc: 3.2 g/dL (ref 2.9–4.4)
Alpha2 Glob SerPl Elph-Mcnc: 0.9 g/dL (ref 0.4–1.0)
B-Globulin SerPl Elph-Mcnc: 0.9 g/dL (ref 0.7–1.3)
Gamma Glob SerPl Elph-Mcnc: 1.3 g/dL (ref 0.4–1.8)
Globulin, Total: 3.5 g/dL (ref 2.2–3.9)
IGA/IMMUNOGLOBULIN A, SERUM: 290 mg/dL (ref 64–422)
IGG (IMMUNOGLOBIN G), SERUM: 1066 mg/dL (ref 700–1600)
IGM (IMMUNOGLOBULIN M), SRM: 122 mg/dL (ref 26–217)
M Protein SerPl Elph-Mcnc: 0.2 g/dL — ABNORMAL HIGH
Total Protein: 6.7 g/dL (ref 6.0–8.5)

## 2017-05-29 LAB — BASIC METABOLIC PANEL
BUN / CREAT RATIO: 22 (ref 12–28)
BUN: 17 mg/dL (ref 8–27)
CO2: 26 mmol/L (ref 18–29)
CREATININE: 0.76 mg/dL (ref 0.57–1.00)
Calcium: 9.2 mg/dL (ref 8.7–10.3)
Chloride: 104 mmol/L (ref 96–106)
GFR calc non Af Amer: 72 mL/min/{1.73_m2} (ref >59)
GFR, EST AFRICAN AMERICAN: 83 mL/min/{1.73_m2} (ref >59)
GLUCOSE: 90 mg/dL (ref 65–99)
Potassium: 4.1 mmol/L (ref 3.5–5.2)
SODIUM: 143 mmol/L (ref 134–144)

## 2017-05-29 LAB — B. BURGDORFI ANTIBODIES

## 2017-05-29 LAB — HEAVY METALS, BLOOD
Arsenic: 7 ug/L (ref 2–23)
LEAD, BLOOD: 3 ug/dL (ref 0–19)
MERCURY: NOT DETECTED ug/L (ref 0.0–14.9)

## 2017-05-29 LAB — ACETYLCHOLINE RECEPTOR, MODULATING: Acetylcholine Modulat Ab: <12 % (ref 0–20)

## 2017-05-29 LAB — ACETYLCHOLINE RECEPTOR, BINDING

## 2017-05-29 LAB — CK: Total CK: 84 U/L (ref 24–173)

## 2017-05-29 LAB — ACETYLCHOLINE RECEPTOR, BLOCKING: Acetylchol Block Ab: 7 % (ref 0–25)

## 2017-05-29 NOTE — Patient Instructions (Signed)
1. Continue omeprazole twice a day, but if you need to you can take the second dose with your night time medications.  2. I will follow up pending studies with your neurologist and make Dr. Oneida Alar aware of pending work up.  3. Please return to see Dr. Oneida Alar in four months.

## 2017-05-29 NOTE — Progress Notes (Signed)
Primary Care Physician: Redmond School, MD  Primary Gastroenterologist:  Barney Drain, MD   Chief Complaint  Patient presents with  . Dysphagia    HPI: Christine Leblanc is a 83 y.o. female here for follow up of dysphagia. Last seen 6 months ago. 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 Followed up with neurology recently, Dr. Sarina Ill. Plans for EMG/NCS, labs, repeat MRI brain/cervical spine.   After our last OV, referred to Dr. Mila Homer (laryngologist) at [facility] for consideration of dilation of UES. Seen in consultation 12/2016. 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 Further speech therapy with biofeedback offered. Patient reports one more session to go. Not much better. Takes along time to eat and so can't eat as much as she wants. Doesn't have time to do all of the techniquaes they showed her to do while eating.   Currently consumes soft food only. Occasionally feels like pill wandering around in throat. Told to take pills with food. No odynophagia. Needs PPI BID but often forgets to take second dose before evening meal and would like to take at bedtime with other meds. C/o constipation with stress. Manages with colace in am, dulcolax at bedtime. No melena, brbpr.    Current Outpatient Prescriptions  Medication Sig Dispense Refill  . albuterol (PROVENTIL HFA) 108 (90 BASE) MCG/ACT inhaler Inhale 1-2 puffs into the lungs every 6 (six) hours as needed for wheezing or shortness of breath. Wheezing, Asthma Symptoms    . amLODipine (NORVASC) 2.5 MG tablet Take 2.5 mg by mouth daily.    . Ascorbic Acid (VITAMIN C PO) Take 1 tablet by mouth daily.    Marland Kitchen aspirin 81 MG tablet Take 81 mg by mouth daily.    . calcium-vitamin D (OSCAL WITH D) 500-200 MG-UNIT per tablet Take 1 tablet by mouth 2 (two) times daily.     . diazepam (VALIUM) 5 MG tablet Take one tablet by mouth at bedtime as needed  for sleep (Patient taking differently: Take 2.5-5 mg by mouth at bedtime as needed (sleep). ) 30 tablet 5  . Glucosamine HCl (GLUCOSAMINE PO) Take 1 tablet by mouth 2 (two) times daily.    Marland Kitchen ipratropium (ATROVENT) 0.03 % nasal spray     . levocetirizine (XYZAL) 5 MG tablet Take 5 mg by mouth every evening.      Marland Kitchen levothyroxine (SYNTHROID, LEVOTHROID) 75 MCG tablet Take 1 tablet (75 mcg total) by mouth daily. 90 tablet 3  . montelukast (SINGULAIR) 10 MG tablet Take 10 mg by mouth at bedtime.    Marland Kitchen omeprazole (PRILOSEC) 20 MG capsule 1 PO 30 mins prior to breakfast and supper 180 capsule 3  . pravastatin (PRAVACHOL) 80 MG tablet TAKE 1 TABLET EVERY EVENING 90 tablet 2  . pyridOXINE (VITAMIN B-6) 100 MG tablet Take 100 mg by mouth at bedtime.    Orlie Dakin Sodium (PERI-COLACE PO) Take 1 tablet by mouth 2 (two) times daily.     . vitamin B-12 (CYANOCOBALAMIN) 1000 MCG tablet Take 1,000 mcg by mouth every morning.    . vitamin E 400 UNIT capsule Take 400 Units by mouth at bedtime.      No current facility-administered medications for this visit.     Allergies as of 05/29/2017 - Review Complete 05/29/2017  Allergen Reaction Noted  . Celecoxib Shortness Of Breath   . Dexilant [dexlansoprazole] Anaphylaxis 05/04/2016    ROS:  General: Negative for anorexia, weight loss, fever,  chills, fatigue, weakness. ENT: Negative for hoarseness, difficulty swallowing , nasal congestion. CV: Negative for chest pain, angina, palpitations, dyspnea on exertion, peripheral edema.  Respiratory: Negative for dyspnea at rest, dyspnea on exertion, cough, sputum, wheezing.  GI: See history of present illness. GU:  Negative for dysuria, hematuria, urinary incontinence, urinary frequency, nocturnal urination.  Endo: Negative for unusual weight change.    Physical Examination:   BP 103/65   Pulse 87   Temp 97.3 F (36.3 C) (Oral)   Ht 5\' 2"  (1.575 m)   Wt 112 lb (50.8 kg)   BMI 20.49 kg/m    General: Well-nourished, well-developed in no acute distress.  Eyes: No icterus. Mouth: Oropharyngeal mucosa moist and pink , no lesions erythema or exudate. Lungs: Clear to auscultation bilaterally.  Heart: Regular rate and rhythm, no murmurs rubs or gallops.  Abdomen: Bowel sounds are normal, nontender, nondistended, no hepatosplenomegaly or masses, no abdominal bruits or hernia , no rebound or guarding.   Extremities: No lower extremity edema. No clubbing or deformities. Neuro: Alert and oriented x 4   Skin: Warm and dry, no jaundice.   Psych: Alert and cooperative, normal mood and affec

## 2017-05-31 ENCOUNTER — Telehealth: Payer: Self-pay | Admitting: *Deleted

## 2017-05-31 DIAGNOSIS — D472 Monoclonal gammopathy: Secondary | ICD-10-CM

## 2017-05-31 NOTE — Telephone Encounter (Signed)
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 ". This is an increase of protein in the blood that is usually benign but we always send to hematology. Please discuss and if she is willing please order hematology consult or let me know to order it. Otherwise labs unremarkable. thanks

## 2017-05-31 NOTE — Telephone Encounter (Signed)
Tried calling pt about labs on home number, phone continued to ring. Unable to LVM Tried mobile, went to VM. Did not LVM per DPR. Pt did not want VM left.

## 2017-06-02 NOTE — Assessment & Plan Note (Signed)
No significant change in symptoms. One more speech therapy session at [facility].  Weight down 6 pounds since 01/2017 although the same as in 07/2016. Neurology work up is pending. Following results of studies pending. Continue speech therapy recommendations.   Reflux well controlled on PPI BID but having trouble remembering second dose before evening meal. Advised to add on to evening meds with small snack.

## 2017-06-03 NOTE — Progress Notes (Signed)
cc'ed to pcp °

## 2017-06-05 NOTE — Telephone Encounter (Signed)
Called pt w/ lab results and recommendations. She is agreeable to referral w/ hematologist at [facility]. Orders entered. Verbalized understanding and appreciation for call.

## 2017-06-05 NOTE — Telephone Encounter (Deleted)
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 ". This is an increase of protein in the blood that is usually benign but we always send to hematology. Please discuss and if she is willing please order hematology consult or let me know to order it. Otherwise labs unremarkable. thanks

## 2017-06-06 ENCOUNTER — Ambulatory Visit
Admission: RE | Admit: 2017-06-06 | Discharge: 2017-06-06 | Disposition: A | Discharge status: Home / Self Care | Payer: Medicare Other | Source: Ambulatory Visit | Attending: Neurology | Admitting: Neurology

## 2017-06-06 DIAGNOSIS — R1310 Dysphagia, unspecified: Secondary | ICD-10-CM | POA: Diagnosis not present

## 2017-06-06 DIAGNOSIS — M50222 Other cervical disc displacement at C5-C6 level: Secondary | ICD-10-CM | POA: Diagnosis not present

## 2017-06-06 DIAGNOSIS — G1220 Motor neuron disease, unspecified: Secondary | ICD-10-CM

## 2017-06-06 DIAGNOSIS — M50221 Other cervical disc displacement at C4-C5 level: Secondary | ICD-10-CM | POA: Diagnosis not present

## 2017-06-06 MED ORDER — GADOBENATE DIMEGLUMINE 529 MG/ML IV SOLN: 10.0000 mL | Freq: Once | INTRAVENOUS | Status: DC | PRN

## 2017-06-11 DIAGNOSIS — I739 Peripheral vascular disease, unspecified: Secondary | ICD-10-CM | POA: Diagnosis not present

## 2017-06-12 ENCOUNTER — Telehealth: Payer: Self-pay

## 2017-06-12 DIAGNOSIS — J45909 Unspecified asthma, uncomplicated: Secondary | ICD-10-CM | POA: Diagnosis not present

## 2017-06-12 NOTE — Telephone Encounter (Signed)
[facility] Requisition form completed for MuSK Antibody Test, awaiting pt and MD signature.

## 2017-06-12 NOTE — Telephone Encounter (Signed)
-----   Message from Melvenia Beam, MD sent at 06/11/2017  7:36 PM EDT ----- Spoke to patient about results. Delsa Sale, would you fill out an [facility] form for anti-musk antibodies and we can get that going? I will see her later this month for emg/ncs. thanks

## 2017-06-18 DIAGNOSIS — J3089 Other allergic rhinitis: Secondary | ICD-10-CM | POA: Diagnosis not present

## 2017-06-18 DIAGNOSIS — J301 Allergic rhinitis due to pollen: Secondary | ICD-10-CM | POA: Diagnosis not present

## 2017-06-19 ENCOUNTER — Telehealth: Payer: Self-pay | Admitting: *Deleted

## 2017-06-19 DIAGNOSIS — R1312 Dysphagia, oropharyngeal phase: Secondary | ICD-10-CM | POA: Diagnosis not present

## 2017-06-19 NOTE — Telephone Encounter (Signed)
Successfully faxed [facility] lab requisition consent form and lab order re: #[id number] lab.

## 2017-06-20 ENCOUNTER — Encounter (HOSPITAL_COMMUNITY): Payer: Self-pay

## 2017-06-20 ENCOUNTER — Encounter (HOSPITAL_COMMUNITY): Payer: Medicare Other

## 2017-06-20 ENCOUNTER — Encounter (HOSPITAL_COMMUNITY): Payer: Medicare Other | Attending: Oncology | Admitting: Oncology

## 2017-06-20 VITALS — BP 122/59 | HR 79 | Temp 98.0°F | Resp 16 | Ht 62.0 in | Wt 112.7 lb

## 2017-06-20 DIAGNOSIS — D472 Monoclonal gammopathy: Secondary | ICD-10-CM

## 2017-06-20 LAB — COMPREHENSIVE METABOLIC PANEL
ALK PHOS: 63 U/L (ref 38–126)
ALT: 15 U/L (ref 14–54)
AST: 22 U/L (ref 15–41)
Albumin: 3.5 g/dL (ref 3.5–5.0)
Anion gap: 7 (ref 5–15)
BILIRUBIN TOTAL: 0.4 mg/dL (ref 0.3–1.2)
BUN: 18 mg/dL (ref 6–20)
CHLORIDE: 104 mmol/L (ref 101–111)
CO2: 29 mmol/L (ref 22–32)
Calcium: 9 mg/dL (ref 8.9–10.3)
Creatinine, Ser: 0.75 mg/dL (ref 0.44–1.00)
GFR calc Af Amer: >60 mL/min (ref >60)
GFR calc non Af Amer: >60 mL/min (ref >60)
GLUCOSE: 96 mg/dL (ref 65–99)
POTASSIUM: 3.8 mmol/L (ref 3.5–5.1)
SODIUM: 140 mmol/L (ref 135–145)
Total Protein: 7 g/dL (ref 6.5–8.1)

## 2017-06-20 LAB — CBC WITH DIFFERENTIAL/PLATELET
Basophils Absolute: 0 10*3/uL (ref 0.0–0.1)
Basophils Relative: 1 %
EOS ABS: 0.8 10*3/uL — AB (ref 0.0–0.7)
Eosinophils Relative: 12 %
HCT: 32.5 % — ABNORMAL LOW (ref 36.0–46.0)
HEMOGLOBIN: 10.7 g/dL — AB (ref 12.0–15.0)
LYMPHS PCT: 23 %
Lymphs Abs: 1.5 10*3/uL (ref 0.7–4.0)
MCH: 29.6 pg (ref 26.0–34.0)
MCHC: 32.9 g/dL (ref 30.0–36.0)
MCV: 90 fL (ref 78.0–100.0)
MONOS PCT: 11 %
Monocytes Absolute: 0.8 10*3/uL (ref 0.1–1.0)
NEUTROS PCT: 53 %
Neutro Abs: 3.5 10*3/uL (ref 1.7–7.7)
Platelets: 210 10*3/uL (ref 150–400)
RBC: 3.61 MIL/uL — ABNORMAL LOW (ref 3.87–5.11)
RDW: 13.6 % (ref 11.5–15.5)
WBC: 6.6 10*3/uL (ref 4.0–10.5)

## 2017-06-20 NOTE — Patient Instructions (Signed)
[facility] Discharge Instructions  RECOMMENDATIONS MADE BY THE CONSULTANT AND ANY TEST RESULTS WILL BE SENT TO YOUR REFERRING PHYSICIAN.  You saw Dr. Zhou today.  Thank you for choosing [facility] at [facility] to provide your oncology and hematology care.  To afford each patient quality time with our provider, please arrive at least 15 minutes before your scheduled appointment time.    If you have a lab appointment with the Cancer Center please come in thru the  Main Entrance and check in at the main information desk  You need to re-schedule your appointment should you arrive 10 or more minutes late.  We strive to give you quality time with our providers, and arriving late affects you and other patients whose appointments are after yours.  Also, if you no show three or more times for appointments you may be dismissed from the clinic at the providers discretion.     Again, thank you for choosing [facility].  Our hope is that these requests will decrease the amount of time that you wait before being seen by our physicians.       _____________________________________________________________  Should you have questions after your visit to [facility], please contact our office at [phone] between the hours of 8:30 a.m. and 4:30 p.m.  Voicemails left after 4:30 p.m. will not be returned until the following business day.  For prescription refill requests, have your pharmacy contact our office.       Resources For Cancer Patients and their Caregivers ? American [employer] Society: Can assist with transportation, wigs, general needs, runs Look Good Feel Better.        1-888-227-6333 ? Cancer Care: Provides financial assistance, online support groups, medication/co-pay assistance.  1-800-813-HOPE (4673) ? [facility] Assists [place] cancer patients and their families through  emotional , educational and financial support.  336-427-4357 ? [place] DSS Where to apply for food stamps, Medicaid and utility assistance. 336-342-1394 ? [facility]: Transportation to medical appointments. 336-347-2287 ? Social Security Administration: May apply for disability if have a Stage IV cancer. 336-342-7796 1-800-772-1213 ? [place] [facility]ging, Disability and Transit Services: Assists with nutrition, care and transit needs. 336-349-2343  Cancer Center Support Programs: @10RELATIVEDAYS@ > Cancer Support Group  2nd Tuesday of the month 1pm-2pm, Journey Room  > Creative Journey  3rd Tuesday of the month 1130am-1pm, Journey Room  > Look Good Feel Better  1st Wednesday of the month 10am-12 noon, Journey Room (Call American Cancer [employer] to register 1-800-395-5775)    

## 2017-06-20 NOTE — Progress Notes (Signed)
[facility] Initial Visit:  Patient Care Team: Redmond School, MD as PCP - General (Internal Medicine) Danie Binder, MD (Gastroenterology) Lattie Haw, Cristopher Estimable, MD (Cardiology) Elsie Saas, MD (Orthopedic Surgery) Marchia Bond, MD (Orthopedic Surgery) Elayne Snare, MD as Attending Physician (Endocrinology) Harold Hedge, Darrick Grinder, MD as Consulting Physician (Allergy and Immunology)  CHIEF COMPLAINTS/PURPOSE OF CONSULTATION: Monoclonal gammopathy  HISTORY OF PRESENTING ILLNESS: Christine Leblanc 83 y.o. female Presents today for evaluation of monoclonal gammopathy. Patient was seen her neurologist who checked a myeloma panel which demonstrated that patient had IgG monoclonal protein with kappa light chain specificity on immunofixation. SPEP demonstrated 0.2 g/dL spike. CMP from 05/21/17 demonstrated creatinine 0.76, calcium 9.2. She has chronic arthritic pains in her hands and various places in her body. She denies any chest pain, shortness breath, abdominal pain, focal weakness.  Review of Systems  Constitutional: Negative for appetite change, chills, fatigue and fever.  HENT:   Negative for hearing loss, lump/mass, mouth sores, sore throat and tinnitus.   Eyes: Negative for eye problems and icterus.  Respiratory: Negative for chest tightness, cough, hemoptysis, shortness of breath and wheezing.   Cardiovascular: Negative for chest pain, leg swelling and palpitations.  Gastrointestinal: Negative for abdominal distention, abdominal pain, blood in stool, diarrhea, nausea and vomiting.  Endocrine: Negative.  Negative for hot flashes.  Genitourinary: Negative for difficulty urinating, frequency and hematuria.   Musculoskeletal: Positive for arthralgias. Negative for neck pain.  Skin: Negative for itching and rash.  Neurological: Negative for dizziness, headaches and speech difficulty.  Hematological: Negative for adenopathy. Does not bruise/bleed easily.   Psychiatric/Behavioral: Negative for confusion. The patient is not nervous/anxious.     MEDICAL HISTORY: Past Medical History:  Diagnosis Date  . Anemia   . Asthma   . Cancer ([place])    skin Ca- ? basal cell   . Chronic back pain   . Chronic neck pain   . Degenerative joint disease    Left shoulder; cervical spine, knees & hands   . Gastroesophageal reflux disease    Hiatal hernia; distal esophageal web requiring dilatation; gastric polyps; gastritis; refuses colonoscopy  . GERD (gastroesophageal reflux disease)   . History of stress test 1990's   stress test done under the care of Dr. Lattie Haw & Dr. Gwenlyn Found, now being followed by Dr. Harl Bowie- in [place] , recently seen & told to f/U in one yr.   . Hyperlipidemia 04/27/2011  . Hypertension   . Hypothyroidism   . Lymphocytic thyroiditis   . Multiple thyroid nodules 2010   Adenomatous; thyroidectomy in 2010  . Osteoarthritis of right knee 07/27/2014  . Pneumonia    hosp. for pneumonia- long time ago   . Primary localized osteoarthrosis of right shoulder 06/21/2015  . Seizures ([place])    yes- as a child- & into adult years, states she took med. for them at one time, stopped at 32 yrs. of age   . Syncope and collapse 2007   Possible CVA in 2012 with left lower extremity weakness; refused hospitalization; CT-Atrophy and chronic microvascular ischemic change.     SURGICAL HISTORY: Past Surgical History:  Procedure Laterality Date  . ABDOMINAL HYSTERECTOMY     fibroids  . CATARACT EXTRACTION     Bilateral; redo surgery on the right for incomplete primary procedure  . COLONOSCOPY  Remote  . ESOPHAGOGASTRODUODENOSCOPY  12/10   Dr. 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 ESOPHAGOGASTRODUODENOSCOPY N/A 03/21/2015   Procedure:  ESOPHAGOGASTRODUODENOSCOPY (EGD);  Surgeon: Danie Binder, MD;  [facility];  Service: Endoscopy;  Laterality: N/A;  1130   . EYE SURGERY    .  PROLAPSED UTERINE FIBROID LIGATION     outcomed with rectocele & cystocele  . SAVORY DILATION N/A 03/21/2015   Procedure: SAVORY DILATION;  Surgeon: Danie Binder, MD;  [facility];  Service: Endoscopy;  Laterality: N/A;  . SHOULDER SURGERY     left  . TOTAL KNEE ARTHROPLASTY Right 07/27/2014   Procedure: RIGHT TOTAL KNEE ARTHROPLASTY;  Surgeon: Johnny Bridge, MD;  Location: [facility];  Service: Orthopedics;  Laterality: Right;  . TOTAL SHOULDER ARTHROPLASTY Right 06/21/2015   Procedure: RIGHT TOTAL SHOULDER ARTHROPLASTY;  Surgeon: Marchia Bond, MD;  Location: [facility];  Service: Orthopedics;  Laterality: Right;  . TOTAL THYROIDECTOMY  2010    SOCIAL HISTORY: Social History   Social History  . Marital status: Widowed    Spouse name: N/A  . Number of children: 4  . Years of education: 64   Occupational History  . 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 Retired     Marine scientist   Social History Main Topics  . Smoking status: Former Smoker    Packs/day: 0.80    Years: 20.00    Types: Cigarettes    Quit date: 12/31/1978  . Smokeless tobacco: Never Used  . Alcohol use No  . Drug use: No  . Sexual activity: No     Comment: widowed since 2010   Other Topics Concern  . Not on file   Social History Narrative   Lives with daughter   Caffeine use: 1-2 cups coffee per day       FAMILY HISTORY Family History  Problem Relation Age of Onset  . Heart disease Mother   . Gallbladder disease Mother   . Heart failure Mother   . Breast cancer Sister        30 years ago  . Heart attack Father   . Colon cancer Neg Hx   . Stroke Neg Hx     ALLERGIES:  is allergic to celecoxib and dexilant [dexlansoprazole].  MEDICATIONS:  Current Outpatient Prescriptions  Medication Sig Dispense Refill  . albuterol (PROVENTIL HFA) 108 (90 BASE) MCG/ACT inhaler Inhale 1-2 puffs into the lungs every 6 (six) hours as needed for wheezing or shortness of breath. Wheezing, Asthma Symptoms    .  amLODipine (NORVASC) 2.5 MG tablet Take 2.5 mg by mouth daily.    . Ascorbic Acid (VITAMIN C PO) Take 1 tablet by mouth daily.    Marland Kitchen aspirin 81 MG tablet Take 81 mg by mouth daily.    . calcium-vitamin D (OSCAL WITH D) 500-200 MG-UNIT per tablet Take 1 tablet by mouth 2 (two) times daily.     . diazepam (VALIUM) 5 MG tablet Take one tablet by mouth at bedtime as needed for sleep (Patient taking differently: Take 2.5-5 mg by mouth at bedtime as needed (sleep). ) 30 tablet 5  . Glucosamine HCl (GLUCOSAMINE PO) Take 1 tablet by mouth 2 (two) times daily.    Marland Kitchen ipratropium (ATROVENT) 0.03 % nasal spray     . levocetirizine (XYZAL) 5 MG tablet Take 5 mg by mouth every evening.      Marland Kitchen levothyroxine (SYNTHROID, LEVOTHROID) 75 MCG tablet Take 1 tablet (75 mcg total) by mouth daily. 90 tablet 3  . montelukast (SINGULAIR) 10 MG tablet Take 10 mg by mouth  at bedtime.    Marland Kitchen omeprazole (PRILOSEC) 20 MG capsule 1 PO 30 mins prior to breakfast and supper 180 capsule 3  . pravastatin (PRAVACHOL) 80 MG tablet TAKE 1 TABLET EVERY EVENING 90 tablet 2  . pyridOXINE (VITAMIN B-6) 100 MG tablet Take 100 mg by mouth at bedtime.    Orlie Dakin Sodium (PERI-COLACE PO) Take 1 tablet by mouth 2 (two) times daily.     . vitamin B-12 (CYANOCOBALAMIN) 1000 MCG tablet Take 1,000 mcg by mouth every morning.    . vitamin E 400 UNIT capsule Take 400 Units by mouth at bedtime.      No current facility-administered medications for this visit.     PHYSICAL EXAMINATION:  ECOG PERFORMANCE STATUS: 0 - Asymptomatic   Vitals:   06/20/17 1234  BP: (!) 122/59  Pulse: 79  Resp: 16  Temp: 98 F (36.7 C)    Filed Weights   06/20/17 1234  Weight: 112 lb 11.2 oz (51.1 kg)     Physical Exam  Constitutional: She is oriented to person, place, and time and well-developed, well-nourished, and in no distress. No distress.  HENT:  Head: Normocephalic and atraumatic.  Mouth/Throat: No oropharyngeal exudate.  Eyes:  Conjunctivae are normal. Pupils are equal, round, and reactive to light. No scleral icterus.  Neck: Normal range of motion. Neck supple. No JVD present.  Cardiovascular: Normal rate, regular rhythm and normal heart sounds.  Exam reveals no gallop and no friction rub.   No murmur heard. Pulmonary/Chest: Breath sounds normal. No respiratory distress. She has no wheezes. She has no rales.  Abdominal: Soft. Bowel sounds are normal. She exhibits no distension. There is no tenderness. There is no guarding.  Musculoskeletal: She exhibits no edema or tenderness.  Lymphadenopathy:    She has no cervical adenopathy.  Neurological: She is alert and oriented to person, place, and time. No cranial nerve deficit.  Skin: Skin is warm and dry. No rash noted. No erythema. No pallor.  Psychiatric: Affect and judgment normal.     LABORATORY DATA: I have personally reviewed the data as listed:  Appointment on 06/20/2017  Component Date Value Ref Range Status  . WBC 06/20/2017 6.6  4.0 - 10.5 K/uL Final  . RBC 06/20/2017 3.61* 3.87 - 5.11 MIL/uL Final  . Hemoglobin 06/20/2017 10.7* 12.0 - 15.0 g/dL Final  . HCT 06/20/2017 32.5* 36.0 - 46.0 % Final  . MCV 06/20/2017 90.0  78.0 - 100.0 fL Final  . MCH 06/20/2017 29.6  26.0 - 34.0 pg Final  . MCHC 06/20/2017 32.9  30.0 - 36.0 g/dL Final  . RDW 06/20/2017 13.6  11.5 - 15.5 % Final  . Platelets 06/20/2017 210  150 - 400 K/uL Final  . Neutrophils Relative % 06/20/2017 53  % Final  . Neutro Abs 06/20/2017 3.5  1.7 - 7.7 K/uL Final  . Lymphocytes Relative 06/20/2017 23  % Final  . Lymphs Abs 06/20/2017 1.5  0.7 - 4.0 K/uL Final  . Monocytes Relative 06/20/2017 11  % Final  . Monocytes Absolute 06/20/2017 0.8  0.1 - 1.0 K/uL Final  . Eosinophils Relative 06/20/2017 12  % Final  . Eosinophils Absolute 06/20/2017 0.8* 0.0 - 0.7 K/uL Final  . Basophils Relative 06/20/2017 1  % Final  . Basophils Absolute 06/20/2017 0.0  0.0 - 0.1 K/uL Final  . Sodium  06/20/2017 140  135 - 145 mmol/L Final  . Potassium 06/20/2017 3.8  3.5 - 5.1 mmol/L Final  . Chloride 06/20/2017 104  101 - 111 mmol/L Final  . CO2 06/20/2017 29  22 - 32 mmol/L Final  . Glucose, Bld 06/20/2017 96  65 - 99 mg/dL Final  . BUN 06/20/2017 18  6 - 20 mg/dL Final  . Creatinine, Ser 06/20/2017 0.75  0.44 - 1.00 mg/dL Final  . Calcium 06/20/2017 9.0  8.9 - 10.3 mg/dL Final  . Total Protein 06/20/2017 7.0  6.5 - 8.1 g/dL Final  . Albumin 06/20/2017 3.5  3.5 - 5.0 g/dL Final  . AST 06/20/2017 22  15 - 41 U/L Final  . ALT 06/20/2017 15  14 - 54 U/L Final  . Alkaline Phosphatase 06/20/2017 63  38 - 126 U/L Final  . Total Bilirubin 06/20/2017 0.4  0.3 - 1.2 mg/dL Final  . GFR calc non Af Amer 06/20/2017 >60  >60 mL/min Final  . GFR calc Af Amer 06/20/2017 >60  >60 mL/min Final   Comment: (NOTE) The eGFR has been calculated using the CKD EPI equation. This calculation has not been validated in all clinical situations. eGFR's persistently <60 mL/min signify possible Chronic Kidney Disease.   . Anion gap 06/20/2017 7  5 - 15 Final  Office Visit on 05/21/2017  Component Date Value Ref Range Status  . AChR Binding Ab, Serum 05/21/2017 <0.03  0.00 - 0.24 nmol/L Final   Comment:                                Negative:   0.00 - 0.24                                Borderline: 0.25 - 0.40                                Positive:        > 0.40   . Acetylchol Block Ab 05/21/2017 7  0 - 25 % Final   Comment:                                Negative:      0 - 25                                Borderline:   26 - 30                                Positive:         >30 Results for this test are for research purposes only by the assay's manufacturer.  The performance characteristics of this product have not been established.  Results should not be used as a diagnostic procedure without confirmation of the diagnosis by another medically established diagnostic product or procedure.   .  Acetylcholine Modulat Ab 05/21/2017 <12  0 - 20 % Final   Comment:                               Negative:          <21  Equivocal:     21 - 25                               Positive:          >25  The assay is linear between values of 12 and 64.  Those <12 and >64 are reported as such.  No single  value for ACR-modulating antibody should be used as  a sole basis for diagnosis or response to therapy.   . Total CK 05/21/2017 84  24 - 173 U/L Final  . Glucose 05/21/2017 90  65 - 99 mg/dL Final  . BUN 05/21/2017 17  8 - 27 mg/dL Final  . Creatinine, Ser 05/21/2017 0.76  0.57 - 1.00 mg/dL Final  . GFR calc non Af Amer 05/21/2017 72  >59 mL/min/1.73 Final  . GFR calc Af Amer 05/21/2017 83  >59 mL/min/1.73 Final  . BUN/Creatinine Ratio 05/21/2017 22  12 - 28 Final  . Sodium 05/21/2017 143  134 - 144 mmol/L Final  . Potassium 05/21/2017 4.1  3.5 - 5.2 mmol/L Final  . Chloride 05/21/2017 104  96 - 106 mmol/L Final  . CO2 05/21/2017 26  18 - 29 mmol/L Final   Comment: **Effective June 10, 2017 Carbon Dioxide, Total**   reference interval will be changing to:              Age                  Female          Female      0 days   - 30 days         43 - 39        16 - 60     31 days   -  1 year         15 - 64        15 - 25      2 years  -  5 years        62 - 33        17 - 23      6 years  - 16 years        73 - 75        19 - 66                >12 years        21 - 70        20 - 65   . Calcium 05/21/2017 9.2  8.7 - 10.3 mg/dL Final  . IgG (Immunoglobin G), Serum 05/21/2017 1066  700 - 1,600 mg/dL Final  . IgA/Immunoglobulin A, Serum 05/21/2017 290  64 - 422 mg/dL Final  . IgM (Immunoglobulin M), Srm 05/21/2017 122  26 - 217 mg/dL Final  . Total Protein 05/21/2017 6.7  6.0 - 8.5 g/dL Final  . Albumin SerPl Elph-Mcnc 05/21/2017 3.2  2.9 - 4.4 g/dL Final  . Alpha 1 05/21/2017 0.3  0.0 - 0.4 g/dL Final  . Alpha2 Glob SerPl Elph-Mcnc 05/21/2017 0.9  0.4 - 1.0 g/dL  Final  . B-Globulin SerPl Elph-Mcnc 05/21/2017 0.9  0.7 - 1.3 g/dL Final  . Gamma Glob SerPl Elph-Mcnc 05/21/2017 1.3  0.4 - 1.8 g/dL Final  . M Protein SerPl Elph-Mcnc 05/21/2017 0.2* Not Observed g/dL Final   An additional M-spike was observed at a concentration of 0.2  g/dL.  . Globulin, Total 05/21/2017 3.5  2.2 - 3.9 g/dL Final  . Albumin/Glob SerPl 05/21/2017 1.0  0.7 - 1.7 Final  . IFE 1 05/21/2017 Comment   Final   Comment: Immunofixation shows IgG monoclonal protein with kappa light chain specificity.   . Please Note 05/21/2017 Comment   Final   Comment: Protein electrophoresis scan will follow via computer, mail, or courier delivery.   . Lyme IgG/IgM Ab 05/21/2017 <0.91  0.00 - 0.90 ISR Final   Comment:                                 Negative         <0.91                                 Equivocal  0.91 - 1.09                                 Positive         >1.09   . Lead, Blood 05/21/2017 3  0 - 19 ug/dL Final   Comment:                           Environmental Exposure:                            WHO Recommendation    <20                           Occupational Exposure:                            OSHA Lead Std          40                            BEI                    30                                 Detection Limit =  1   . Arsenic 05/21/2017 7  2 - 23 ug/L Final                                   Detection Limit = 1  . Mercury 05/21/2017 None Detected  0.0 - 14.9 ug/L Final   Comment:                         Environmental Exposure:  <15.0                         Occupational Exposure:                          BEI - Inorganic Mercury: 15.0  Detection Limit =  1.0     RADIOGRAPHIC STUDIES: I have personally reviewed the radiological images as listed and agree with the findings in the report  No results found.  ASSESSMENT: Monoclonal gammopathy of unknown significance: IgG monoclonal protein with kappa light chain specificity  on immunofixation. SPEP demonstrated 0.2 g/dL spike.  PLAN:  I will perform a full myeloma workup with labs as stated below. We will call patient with results of the lab results.  Suspect the patient has MGUS at this time. No indication to start treatment for multiple myeloma.  Return to clinic in 3 months for follow-up with repeat lab.  Orders Placed This Encounter  Procedures  . CBC with Differential    Standing Status:   Future    Number of Occurrences:   1    Standing Expiration Date:   06/20/2018  . Comprehensive metabolic panel    Standing Status:   Future    Number of Occurrences:   1    Standing Expiration Date:   06/20/2018  . Multiple Myeloma Panel (SPEP&IFE w/QIG)    Standing Status:   Future    Number of Occurrences:   1    Standing Expiration Date:   06/20/2018  . Kappa/lambda light chains    Standing Status:   Future    Number of Occurrences:   1    Standing Expiration Date:   06/20/2018  . Beta 2 microglobuline, serum    Standing Status:   Future    Number of Occurrences:   1    Standing Expiration Date:   06/20/2018  . IgG, IgA, IgM    Standing Status:   Future    Number of Occurrences:   1    Standing Expiration Date:   06/20/2018  . CBC with Differential    Standing Status:   Future    Standing Expiration Date:   06/20/2018  . Comprehensive metabolic panel    Standing Status:   Future    Standing Expiration Date:   06/20/2018  . Multiple Myeloma Panel (SPEP&IFE w/QIG)    Standing Status:   Future    Standing Expiration Date:   06/20/2018  . Kappa/lambda light chains    Standing Status:   Future    Standing Expiration Date:   06/20/2018  . Beta 2 microglobuline, serum    Standing Status:   Future    Standing Expiration Date:   06/20/2018    All questions were answered. The patient knows to call the clinic with any problems, questions or concerns.  This note was electronically signed.    Twana First, MD  06/20/2017 3:30 PM

## 2017-06-21 LAB — IGG, IGA, IGM
IGG (IMMUNOGLOBIN G), SERUM: 1138 mg/dL (ref 700–1600)
IgA: 309 mg/dL (ref 64–422)
IgM, Serum: 132 mg/dL (ref 26–217)

## 2017-06-21 LAB — KAPPA/LAMBDA LIGHT CHAINS
KAPPA FREE LGHT CHN: 30.5 mg/L — AB (ref 3.3–19.4)
Kappa, lambda light chain ratio: 1.99 — ABNORMAL HIGH (ref 0.26–1.65)
LAMDA FREE LIGHT CHAINS: 15.3 mg/L (ref 5.7–26.3)

## 2017-06-21 LAB — BETA 2 MICROGLOBULIN, SERUM: Beta-2 Microglobulin: 2.6 mg/L — ABNORMAL HIGH (ref 0.6–2.4)

## 2017-06-24 LAB — MULTIPLE MYELOMA PANEL, SERUM
ALBUMIN SERPL ELPH-MCNC: 3.1 g/dL (ref 2.9–4.4)
ALBUMIN/GLOB SERPL: 1.1 (ref 0.7–1.7)
Alpha 1: 0.3 g/dL (ref 0.0–0.4)
Alpha2 Glob SerPl Elph-Mcnc: 0.8 g/dL (ref 0.4–1.0)
B-GLOBULIN SERPL ELPH-MCNC: 0.8 g/dL (ref 0.7–1.3)
GAMMA GLOB SERPL ELPH-MCNC: 1.2 g/dL (ref 0.4–1.8)
GLOBULIN, TOTAL: 3.1 g/dL (ref 2.2–3.9)
IgA: 301 mg/dL (ref 64–422)
IgG (Immunoglobin G), Serum: 1032 mg/dL (ref 700–1600)
IgM, Serum: 132 mg/dL (ref 26–217)
M PROTEIN SERPL ELPH-MCNC: 0.3 g/dL — AB
Total Protein ELP: 6.2 g/dL (ref 6.0–8.5)

## 2017-06-27 ENCOUNTER — Ambulatory Visit (INDEPENDENT_AMBULATORY_CARE_PROVIDER_SITE_OTHER): Payer: Self-pay | Admitting: Neurology

## 2017-06-27 ENCOUNTER — Ambulatory Visit (INDEPENDENT_AMBULATORY_CARE_PROVIDER_SITE_OTHER): Payer: Medicare Other | Admitting: Neurology

## 2017-06-27 DIAGNOSIS — G1220 Motor neuron disease, unspecified: Secondary | ICD-10-CM

## 2017-06-27 DIAGNOSIS — R1310 Dysphagia, unspecified: Secondary | ICD-10-CM | POA: Diagnosis not present

## 2017-06-27 DIAGNOSIS — Z0289 Encounter for other administrative examinations: Secondary | ICD-10-CM

## 2017-06-27 DIAGNOSIS — R1319 Other dysphagia: Secondary | ICD-10-CM

## 2017-06-27 NOTE — Progress Notes (Signed)
See procedure note.

## 2017-07-01 NOTE — Progress Notes (Signed)
Neurology work up still in progress. Results from NCV with EMG by Dr. Sarina Ill pending. MuSK Antibody test planned. See if we can get NCV with EMG procedure report.   Let's go ahead and get OV with SLF planned for 07/2017 for f/u dysphagia.

## 2017-07-02 NOTE — Progress Notes (Signed)
OV made and appt card mailed. Requested records from Dr Sarina Ill at fax [phone]

## 2017-07-02 NOTE — Progress Notes (Signed)
Forwarding to Manuela Schwartz to get the report requested and to schedule the OV appt with Dr. Oneida Alar.

## 2017-07-04 NOTE — Procedures (Signed)
Full Name: Christine Leblanc Gender: Female MRN #: [id number] Date of Birth: Nov 25, 1929    Visit Date: 06/27/2017 10:34 Age: 83 Years 0 Months Old  History: 83 y.o. female here as a referral from Dr. Gerarda Fraction for dysphagia . PMHx of dysphagia, former smoker, total thyroidectomy in 2010, distal esophageal web requiring dilation. No suggestion, in the history or on exam of Parkinson's disease, myasthenia gravis which are neurologic conditions that can cause dysphagia. Mini-Mental status exam was 30 out of 30 so less likely dementia. No significant progression for a year making motor neuron disease less likely but does have bulbar weakness. If she had bulbar onset ALS would expect more progression in the last year but can't rule it out. No fasciculations noted. A year ago was 116 pounds and today is 114 pounds. No significant changes in muscle wasting or weakness however she does have diffuse atrophy.  Summary: EMG/NCS was performed on the right upper and right lower extremities. The right tibial motor nerve showed reduced amplitude (0.76mV, N> 4). The right and left Sural sensory nerves showed reduced amplitude (3uv, N>6) however this can be normal for age. All remaining nerves as detailed below were within normal limits. All muscles as detailed below within normal limits.    Conclusion: No electrophysiologic evidence for motor neuron disease, muscular dystrophy or neuromuscular disorders to explain patient's chronic dysphagia.   Cc: Marlou Porch, CMA, Dr. Redmond School  Sarina Ill M.D.  [facility] [place], [place] [address] Tel: [phone] Fax: [phone]        [facility]    Nerve / Sites Rec. Site Latency Ref. Amplitude Ref. Rel Amp Segments Distance Velocity Ref. Area    ms ms mV mV %  cm m/s m/s mVms  R Median - APB     Wrist APB 3.8 ?4.4 4.6 ?4.0 100 Wrist - APB 7   16.6     Upper arm APB 8.3  4.4  96.6 Upper arm - Wrist 22 49 ?49 17.0  R Ulnar -  ADM     Wrist ADM 2.9 ?3.3 6.0 ?6.0 100 Wrist - ADM 7   21.0     B.Elbow ADM 6.6  5.5  92.5 B.Elbow - Wrist 19 51 ?49 19.6     A.Elbow ADM 9.0  5.4  98.1 A.Elbow - B.Elbow 12 50 ?49 19.3         A.Elbow - Wrist      R Peroneal - EDB     Ankle EDB 5.7 ?6.5 2.1 ?2.0 100 Ankle - EDB 9   5.5     Fib head EDB 12.7  1.8  83.7 Fib head - Ankle 29 42 ?44 5.5     Pop fossa EDB 15.4  1.7  97.4 Pop fossa - Fib head 11 41 ?44 5.6         Pop fossa - Ankle      R Tibial - AH     Ankle AH 5.8 ?5.8 0.8 ?4.0 100 Ankle - AH 9   4.1     Pop fossa AH 15.9  0.8  106 Pop fossa - Ankle 38 38 ?41 1.1             SNC    Nerve / Sites Rec. Site Peak Lat Ref.  Amp Ref. Segments Distance    ms ms V V  cm  R Radial - Anatomical snuff box (Forearm)     Forearm  Wrist 2.86 ?2.90 15 ?15 Forearm - Wrist 10  R Sural - Ankle (Calf)     Calf Ankle 3.13 ?4.40 3 ?6 Calf - Ankle 14  R Superficial peroneal - Ankle     Lat leg Ankle 3.70 ?4.40 3 ?6 Lat leg - Ankle 14  R Median - Orthodromic (Dig II, Mid palm)     Dig II Wrist 3.33 ?3.40 15 ?10 Dig II - Wrist 13  R Ulnar - Orthodromic, (Dig V, Mid palm)     Dig V Wrist 3.02 ?3.10 6 ?5 Dig V - Wrist 52                F  Wave    Nerve F Lat Ref.   ms ms  R Ulnar - ADM 30.9 ?32.0  R Tibial - AH 55.5 ?56.0         EMG full       EMG Summary Table    Spontaneous MUAP Recruitment  Muscle IA Fib PSW Fasc Other Amp Dur. Poly Pattern  R. Deltoid Normal None None None _______ Normal Normal Normal Normal  R. Biceps brachii Normal None None None _______ Normal Normal Normal Normal  R. Triceps brachii Normal None None None _______ Normal Normal Normal Normal  R. Pronator teres Normal None None None _______ Normal Normal Normal Normal  R. First dorsal interosseous Normal None None None _______ Normal Normal Normal Normal  R. Opponens pollicis Normal None None None _______ Normal Normal Normal Normal  R. Iliopsoas Normal None None None _______ Normal Normal Normal Normal    R. Vastus medialis Normal None None None _______ Normal Normal Normal Normal  R. Tibialis anterior Normal None None None _______ Normal Normal Normal Normal  R. Gastrocnemius (Medial head) Normal None None None _______ Normal Normal Normal Normal  R. Extensor hallucis longus Normal None None None _______ Normal Normal Normal Normal  R. Genioglossus Normal None None None _______ Normal Normal Normal Normal

## 2017-07-04 NOTE — Progress Notes (Signed)
Full Name: Galilee Pierron Gender: Female MRN #: [id number] Date of Birth: [birth year]    Visit Date: 06/27/2017 10:34 Age: 83 Years 0 Months Old  History: 83 y.o. female here as a referral from Dr. Gerarda Fraction for dysphagia . PMHx of dysphagia, former smoker, total thyroidectomy in 2010, distal esophageal web requiring dilation. No suggestion, in the history or on exam of Parkinson's disease, myasthenia gravis which are neurologic conditions that can cause dysphagia. Mini-Mental status exam was 30 out of 30 so less likely dementia. No significant progression for a year making motor neuron disease less likely but does have  significant bulbar weakness. If she had bulbar onset ALS would expect more progression in the last year but can't rule it out. No fasciculations noted. A year ago was 116 pounds and today is 114 pounds. No significant changes in muscle wasting or weakness however she does have diffuse atrophy.  Summary: EMG/NCS was performed on the right upper and right lower extremities. The right tibial motor nerve showed reduced amplitude (0.37mV, N> 4). The right and left Sural sensory nerves showed reduced amplitude (3uv, N>6) however this can be normal for age. All remaining nerves as detailed below were within normal limits. All muscles as detailed below within normal limits.    Conclusion: No electrophysiologic evidence for motor neuron disease, muscular dystrophy or neuromuscular disorders to explain patient's chronic dysphagia.   Cc: Marlou Porch, CMA, Dr. Redmond School  Sarina Ill M.D.  [facility] [place], [place] [address] Tel: [phone] Fax: [phone]        [facility]    Nerve / Sites Rec. Site Latency Ref. Amplitude Ref. Rel Amp Segments Distance Velocity Ref. Area    ms ms mV mV %  cm m/s m/s mVms  R Median - APB     Wrist APB 3.8 ?4.4 4.6 ?4.0 100 Wrist - APB 7   16.6     Upper arm APB 8.3  4.4  96.6 Upper arm - Wrist 22 49 ?49 17.0    R Ulnar - ADM     Wrist ADM 2.9 ?3.3 6.0 ?6.0 100 Wrist - ADM 7   21.0     B.Elbow ADM 6.6  5.5  92.5 B.Elbow - Wrist 19 51 ?49 19.6     A.Elbow ADM 9.0  5.4  98.1 A.Elbow - B.Elbow 12 50 ?49 19.3         A.Elbow - Wrist      R Peroneal - EDB     Ankle EDB 5.7 ?6.5 2.1 ?2.0 100 Ankle - EDB 9   5.5     Fib head EDB 12.7  1.8  83.7 Fib head - Ankle 29 42 ?44 5.5     Pop fossa EDB 15.4  1.7  97.4 Pop fossa - Fib head 11 41 ?44 5.6         Pop fossa - Ankle      R Tibial - AH     Ankle AH 5.8 ?5.8 0.8 ?4.0 100 Ankle - AH 9   4.1     Pop fossa AH 15.9  0.8  106 Pop fossa - Ankle 38 38 ?41 1.1             SNC    Nerve / Sites Rec. Site Peak Lat Ref.  Amp Ref. Segments Distance    ms ms V V  cm  R Radial - Anatomical snuff box (Forearm)  Forearm Wrist 2.86 ?2.90 15 ?15 Forearm - Wrist 10  R Sural - Ankle (Calf)     Calf Ankle 3.13 ?4.40 3 ?6 Calf - Ankle 14  R Superficial peroneal - Ankle     Lat leg Ankle 3.70 ?4.40 3 ?6 Lat leg - Ankle 14  R Median - Orthodromic (Dig II, Mid palm)     Dig II Wrist 3.33 ?3.40 15 ?10 Dig II - Wrist 13  R Ulnar - Orthodromic, (Dig V, Mid palm)     Dig V Wrist 3.02 ?3.10 6 ?5 Dig V - Wrist 2                F  Wave    Nerve F Lat Ref.   ms ms  R Ulnar - ADM 30.9 ?32.0  R Tibial - AH 55.5 ?56.0         EMG full       EMG Summary Table    Spontaneous MUAP Recruitment  Muscle IA Fib PSW Fasc Other Amp Dur. Poly Pattern  R. Deltoid Normal None None None _______ Normal Normal Normal Normal  R. Biceps brachii Normal None None None _______ Normal Normal Normal Normal  R. Triceps brachii Normal None None None _______ Normal Normal Normal Normal  R. Pronator teres Normal None None None _______ Normal Normal Normal Normal  R. First dorsal interosseous Normal None None None _______ Normal Normal Normal Normal  R. Opponens pollicis Normal None None None _______ Normal Normal Normal Normal  R. Iliopsoas Normal None None None _______ Normal Normal  Normal Normal  R. Vastus medialis Normal None None None _______ Normal Normal Normal Normal  R. Tibialis anterior Normal None None None _______ Normal Normal Normal Normal  R. Gastrocnemius (Medial head) Normal None None None _______ Normal Normal Normal Normal  R. Extensor hallucis longus Normal None None None _______ Normal Normal Normal Normal  R. Genioglossus Normal None None None _______ Normal Normal Normal Normal

## 2017-07-08 DIAGNOSIS — J45909 Unspecified asthma, uncomplicated: Secondary | ICD-10-CM | POA: Diagnosis not present

## 2017-07-10 ENCOUNTER — Telehealth: Payer: Self-pay | Admitting: Endocrinology

## 2017-07-11 NOTE — Telephone Encounter (Signed)
Called [facility] to follow-up on MuSK antibody results. However, lab said that they never received the lab requisition despite fax confirmation on 06/19/17. Requisition form re-faxed to verified F #.

## 2017-07-12 NOTE — Progress Notes (Signed)
Results in EPIC, OV note [facility]. No electrophysiological evidence for motor neuron disease, muscular dystrophy or neuromuscular disorders to explain patient's chronic dysphagia.   Keep ov with slf.

## 2017-07-15 NOTE — Telephone Encounter (Signed)
Pt called said lab requisition was never rec'd. It should go to [facility] (p) [phone]  (f) [phone]. Pt said the vial should be coming to her home. She talked with someone from [facility] today and was advised shipment with vials will be shipped to her home. Pt said she refused the shipment before because she thought it was a scam. I'm not clear with this message, please call the patient.

## 2017-07-24 DIAGNOSIS — J45909 Unspecified asthma, uncomplicated: Secondary | ICD-10-CM | POA: Diagnosis not present

## 2017-07-24 DIAGNOSIS — G1220 Motor neuron disease, unspecified: Secondary | ICD-10-CM | POA: Diagnosis not present

## 2017-07-24 DIAGNOSIS — R1310 Dysphagia, unspecified: Secondary | ICD-10-CM | POA: Diagnosis not present

## 2017-08-07 DIAGNOSIS — M4180 Other forms of scoliosis, site unspecified: Secondary | ICD-10-CM | POA: Diagnosis not present

## 2017-08-07 DIAGNOSIS — M1991 Primary osteoarthritis, unspecified site: Secondary | ICD-10-CM | POA: Diagnosis not present

## 2017-08-07 DIAGNOSIS — E063 Autoimmune thyroiditis: Secondary | ICD-10-CM | POA: Diagnosis not present

## 2017-08-07 DIAGNOSIS — Z1389 Encounter for screening for other disorder: Secondary | ICD-10-CM | POA: Diagnosis not present

## 2017-08-07 DIAGNOSIS — I10 Essential (primary) hypertension: Secondary | ICD-10-CM | POA: Diagnosis not present

## 2017-08-07 DIAGNOSIS — Z0000 Encounter for general adult medical examination without abnormal findings: Secondary | ICD-10-CM | POA: Diagnosis not present

## 2017-08-07 DIAGNOSIS — Z6820 Body mass index (BMI) 20.0-20.9, adult: Secondary | ICD-10-CM | POA: Diagnosis not present

## 2017-08-07 DIAGNOSIS — K219 Gastro-esophageal reflux disease without esophagitis: Secondary | ICD-10-CM | POA: Diagnosis not present

## 2017-08-12 DIAGNOSIS — J45909 Unspecified asthma, uncomplicated: Secondary | ICD-10-CM | POA: Diagnosis not present

## 2017-08-13 NOTE — Telephone Encounter (Signed)
Received faxed results from [facility] today reporting that anti-MuSK test was negative. Faxed to [facility] as previously requested. Sent to med records for scanning, copy to Dr. Jaynee Eagles for review.

## 2017-08-13 NOTE — Telephone Encounter (Signed)
Spoke to pt last month and reviewed instructions for [place] lab.

## 2017-08-16 NOTE — Progress Notes (Signed)
REVIEWED. WEIGHT 12-118 SINCE AUG 2017.

## 2017-08-19 DIAGNOSIS — J45909 Unspecified asthma, uncomplicated: Secondary | ICD-10-CM | POA: Diagnosis not present

## 2017-08-20 DIAGNOSIS — E1142 Type 2 diabetes mellitus with diabetic polyneuropathy: Secondary | ICD-10-CM | POA: Diagnosis not present

## 2017-08-20 DIAGNOSIS — L851 Acquired keratosis [keratoderma] palmaris et plantaris: Secondary | ICD-10-CM | POA: Diagnosis not present

## 2017-08-20 DIAGNOSIS — B351 Tinea unguium: Secondary | ICD-10-CM | POA: Diagnosis not present

## 2017-08-22 DIAGNOSIS — J45909 Unspecified asthma, uncomplicated: Secondary | ICD-10-CM | POA: Diagnosis not present

## 2017-08-26 DIAGNOSIS — J45909 Unspecified asthma, uncomplicated: Secondary | ICD-10-CM | POA: Diagnosis not present

## 2017-09-03 DIAGNOSIS — J45909 Unspecified asthma, uncomplicated: Secondary | ICD-10-CM | POA: Diagnosis not present

## 2017-09-04 ENCOUNTER — Ambulatory Visit: Payer: Medicare Other | Admitting: Gastroenterology

## 2017-09-06 DIAGNOSIS — J45909 Unspecified asthma, uncomplicated: Secondary | ICD-10-CM | POA: Diagnosis not present

## 2017-09-11 ENCOUNTER — Ambulatory Visit (INDEPENDENT_AMBULATORY_CARE_PROVIDER_SITE_OTHER): Payer: Medicare Other | Admitting: Endocrinology

## 2017-09-11 ENCOUNTER — Encounter: Payer: Self-pay | Admitting: Endocrinology

## 2017-09-11 VITALS — BP 110/58 | HR 80 | Ht 62.0 in | Wt 109.2 lb

## 2017-09-11 DIAGNOSIS — E890 Postprocedural hypothyroidism: Secondary | ICD-10-CM

## 2017-09-11 NOTE — Progress Notes (Signed)
Christine Leblanc 83 y.o.    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   She previously had been on 100 mcg since 03/2014 This was subsequently reduced down to 75 g Her TSH was relatively low in 5/18 but she did not come back for follow-up in July  She continues to be having complaints of fatigue, this appears to be independent of her thyroid levels However she did feel better when her dose was increased in January when TSH was high  She has mild chronic cold intolerance  Compliance with the levothyroxine has been as prescribed with taking the tablet in the morning before breakfast.  Wt Readings from Last 3 Encounters:  09/11/17 109 lb 3.2 oz (49.5 kg)  06/20/17 112 lb 11.2 oz (51.1 kg)  05/29/17 112 lb (50.8 kg)   Lab Results  Component Value Date   TSH 0.13 (L) 05/01/2017   TSH 6.020 (H) 01/24/2017   TSH 0.84 10/01/2016   FREET4 1.75 (H) 05/01/2017   FREET4 1.66 01/24/2017   FREET4 1.21 10/01/2016       Allergies as of 09/11/2017      Reactions   Celecoxib Shortness Of Breath   Dexilant [dexlansoprazole] Anaphylaxis   abd pain      Medication List       Accurate as of 09/11/17 11:59 PM. Always use your most recent med list.          amLODipine 2.5 MG tablet Commonly known as:  NORVASC Take 2.5 mg by mouth daily.   aspirin 81 MG tablet Take 81 mg by mouth daily.   calcium-vitamin D 500-200 MG-UNIT tablet Commonly known as:  OSCAL WITH D Take 1 tablet by mouth 2 (two) times daily.   diazepam 5 MG tablet Commonly known as:  VALIUM Take one  tablet by mouth at bedtime as needed for sleep   GLUCOSAMINE PO Take 1 tablet by mouth 2 (two) times daily.   ipratropium 0.03 % nasal spray Commonly known as:  ATROVENT   levocetirizine 5 MG tablet Commonly known as:  XYZAL Take 5 mg by mouth every evening.   levothyroxine 75 MCG tablet Commonly known as:  SYNTHROID, LEVOTHROID Take 1 tablet (75 mcg total) by mouth daily.   montelukast 10 MG tablet Commonly known as:  SINGULAIR Take 10 mg by mouth at bedtime.   omeprazole 20 MG capsule Commonly known as:  PRILOSEC 1 PO 30 mins prior to breakfast and supper   PERI-COLACE PO Take 1 tablet by mouth 2 (two) times daily.   pravastatin 80 MG tablet Commonly known as:  PRAVACHOL TAKE 1 TABLET EVERY EVENING   PROVENTIL HFA 108 (90 Base) MCG/ACT inhaler Generic drug:  albuterol Inhale 1-2 puffs into the lungs every 6 (six) hours as needed for wheezing or shortness of breath. Wheezing, Asthma Symptoms   pyridOXINE 100 MG tablet Commonly known as:  VITAMIN B-6 Take 100 mg by mouth at bedtime.   vitamin B-12 1000 MCG tablet Commonly known as:  CYANOCOBALAMIN Take 1,000 mcg by mouth every morning.   VITAMIN C PO Take 1 tablet  by mouth daily.   vitamin E 400 UNIT capsule Take 400 Units by mouth at bedtime.            Discharge Care Instructions        Start     Ordered   09/11/17 0000  TSH     09/11/17 1352   09/11/17 0000  T4, free     09/11/17 1352      Allergies:  Allergies  Allergen Reactions  . Celecoxib Shortness Of Breath  . Dexilant [Dexlansoprazole] Anaphylaxis    abd pain    Past Medical History:  Diagnosis Date  . Anemia   . Asthma   . Cancer ([place])    skin Ca- ? basal cell   . Chronic back pain   . Chronic neck pain   . Degenerative joint disease    Left shoulder; cervical spine, knees & hands   . Gastroesophageal reflux disease    Hiatal hernia; distal esophageal web requiring dilatation; gastric polyps; gastritis; refuses  colonoscopy  . GERD (gastroesophageal reflux disease)   . History of stress test 1990's   stress test done under the care of Dr. Lattie Haw & Dr. Gwenlyn Found, now being followed by Dr. Harl Bowie- in [place] , recently seen & told to f/U in one yr.   . Hyperlipidemia 04/27/2011  . Hypertension   . Hypothyroidism   . Lymphocytic thyroiditis   . Multiple thyroid nodules 2010   Adenomatous; thyroidectomy in 2010  . Osteoarthritis of right knee 07/27/2014  . Pneumonia    hosp. for pneumonia- long time ago   . Primary localized osteoarthrosis of right shoulder 06/21/2015  . Seizures ([place])    yes- as a child- & into adult years, states she took med. for them at one time, stopped at 32 yrs. of age   . Syncope and collapse 2007   Possible CVA in 2012 with left lower extremity weakness; refused hospitalization; CT-Atrophy and chronic microvascular ischemic change.     Past Surgical History:  Procedure Laterality Date  . ABDOMINAL HYSTERECTOMY     fibroids  . CATARACT EXTRACTION     Bilateral; redo surgery on the right for incomplete primary procedure  . COLONOSCOPY  Remote  . ESOPHAGOGASTRODUODENOSCOPY  12/10   Dr. 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 ESOPHAGOGASTRODUODENOSCOPY N/A 03/21/2015   Procedure: ESOPHAGOGASTRODUODENOSCOPY (EGD);  Surgeon: Danie Binder, MD;  [facility];  Service: Endoscopy;  Laterality: N/A;  1130   . EYE SURGERY    . PROLAPSED UTERINE FIBROID LIGATION     outcomed with rectocele & cystocele  . SAVORY DILATION N/A 03/21/2015   Procedure: SAVORY DILATION;  Surgeon: Danie Binder, MD;  [facility];  Service: Endoscopy;  Laterality: N/A;  . SHOULDER SURGERY     left  . TOTAL KNEE ARTHROPLASTY Right 07/27/2014   Procedure: RIGHT TOTAL KNEE ARTHROPLASTY;  Surgeon: Johnny Bridge, MD;  Location: [facility];  Service: Orthopedics;  Laterality: Right;  . TOTAL SHOULDER ARTHROPLASTY Right 06/21/2015    Procedure: RIGHT TOTAL SHOULDER ARTHROPLASTY;  Surgeon: Marchia Bond, MD;  Location: [facility];  Service: Orthopedics;  Laterality: Right;  . TOTAL THYROIDECTOMY  2010    Family History  Problem Relation Age of Onset  . Heart disease Mother   . Gallbladder disease Mother   . Heart failure Mother   . Breast cancer Sister        30 years ago  . Heart attack  Father   . Colon cancer Neg Hx   . Stroke Neg Hx     Social History:  reports that she quit smoking about 38 years ago. Her smoking use included Cigarettes. She has a 16.00 pack-year smoking history. She has never used smokeless tobacco. She reports that she does not drink alcohol or use drugs.  REVIEW Of SYSTEMS:  Review of Systems  Gastrointestinal:       Dysphagia +   Weight History:  She says she has a slowly and is losing a little weight  Wt Readings from Last 3 Encounters:  09/11/17 109 lb 3.2 oz (49.5 kg)  06/20/17 112 lb 11.2 oz (51.1 kg)  05/29/17 112 lb (50.8 kg)    History of hypercholesterolemia followed by PCP   Not on B12, This had been recommended by PCP but she thinks she stopped it because her level was high   Examination:   BP (!) 110/58   Pulse 80   Ht 5\' 2"  (1.575 m)   Wt 109 lb 3.2 oz (49.5 kg)   SpO2 98%   BMI 19.97 kg/m     Biceps reflexes normal No edema    Assessments   Hypothyroidism, postsurgical with previously stable supplement those those of levothyroxine She has had variable doses in the last year or so Since her TSH was low she is now taking only 75 g She is consistent with taking it daily in the morning  Her symptoms are quite nonspecific with continued fatigue, some weight loss and not clear what her thyroid levels are recently  Appears euthyroid today  Will check her labs which she will have drawn near her home and decide on further follow-up   Recommended that she go back to B12 supplements instead of the herbal energy supplement she was asking about which has caffeine  also   There are no Patient Instructions on file for this visit.   Kenderick Kobler 09/12/2017, 10:15 AM

## 2017-09-12 ENCOUNTER — Other Ambulatory Visit: Payer: Self-pay | Admitting: Endocrinology

## 2017-09-12 DIAGNOSIS — E890 Postprocedural hypothyroidism: Secondary | ICD-10-CM | POA: Diagnosis not present

## 2017-09-12 DIAGNOSIS — E748 Other specified disorders of carbohydrate metabolism: Secondary | ICD-10-CM | POA: Diagnosis not present

## 2017-09-12 DIAGNOSIS — R7301 Impaired fasting glucose: Secondary | ICD-10-CM | POA: Diagnosis not present

## 2017-09-12 DIAGNOSIS — K219 Gastro-esophageal reflux disease without esophagitis: Secondary | ICD-10-CM | POA: Diagnosis not present

## 2017-09-12 DIAGNOSIS — J309 Allergic rhinitis, unspecified: Secondary | ICD-10-CM | POA: Diagnosis not present

## 2017-09-12 DIAGNOSIS — E785 Hyperlipidemia, unspecified: Secondary | ICD-10-CM | POA: Diagnosis not present

## 2017-09-12 DIAGNOSIS — Z1389 Encounter for screening for other disorder: Secondary | ICD-10-CM | POA: Diagnosis not present

## 2017-09-12 DIAGNOSIS — E039 Hypothyroidism, unspecified: Secondary | ICD-10-CM | POA: Diagnosis not present

## 2017-09-13 LAB — T4, FREE: Free T4: 1.78 ng/dL — ABNORMAL HIGH (ref 0.82–1.77)

## 2017-09-17 LAB — SPECIMEN STATUS REPORT

## 2017-09-19 NOTE — Progress Notes (Signed)
Please call to let patient know that the lab results are normal and no change in medication needed

## 2017-09-20 ENCOUNTER — Other Ambulatory Visit (HOSPITAL_COMMUNITY): Payer: Medicare Other

## 2017-09-20 ENCOUNTER — Ambulatory Visit (HOSPITAL_COMMUNITY): Payer: Medicare Other

## 2017-09-21 LAB — SPECIMEN STATUS REPORT

## 2017-09-21 LAB — TSH: TSH: 1.15 u[IU]/mL (ref 0.450–4.500)

## 2017-09-22 ENCOUNTER — Other Ambulatory Visit: Payer: Self-pay | Admitting: Endocrinology

## 2017-09-25 DIAGNOSIS — J45909 Unspecified asthma, uncomplicated: Secondary | ICD-10-CM | POA: Diagnosis not present

## 2017-10-16 ENCOUNTER — Encounter: Payer: Self-pay | Admitting: Gastroenterology

## 2017-10-16 ENCOUNTER — Other Ambulatory Visit: Payer: Self-pay

## 2017-10-16 ENCOUNTER — Ambulatory Visit (INDEPENDENT_AMBULATORY_CARE_PROVIDER_SITE_OTHER): Payer: Medicare Other | Admitting: Gastroenterology

## 2017-10-16 DIAGNOSIS — R1312 Dysphagia, oropharyngeal phase: Secondary | ICD-10-CM

## 2017-10-16 DIAGNOSIS — R1319 Other dysphagia: Secondary | ICD-10-CM | POA: Diagnosis not present

## 2017-10-16 NOTE — Patient Instructions (Addendum)
KEEP EATING ROLOS, HERSHEY CHOCOLATE, OR ANY CANDY YOU LIKE.  DRINK BOOST, ENSURE, OR CARNATION INSTANT BREAKFAST FOUR TIMES A DAY.  SEE DABNEY TO REVIEW SWALLOWING EXERCISES.  FOLLOW UP IN 3 MOS.

## 2017-10-16 NOTE — Assessment & Plan Note (Addendum)
SYMPTOMS FAIRLY WELL CONTROLLED. WEIGHT DOWN 4 LBS SINCE LAST VISIT.  KEEP EATING ROLOS, HERSHEY CHOCOLATE, OR ANY CANDY YOU LIKE. DRINK BOOST, ENSURE, OR CARNATION INSTANT BREAKFAST FOUR TIMES A DAY. FOLLOW UP WITH DABNEY PORTER TO REVIEW SWALLOWING EXERCISES. OCT 23 @ 230 PM. FOLLOW UP IN 3 MOS.

## 2017-10-16 NOTE — Progress Notes (Signed)
On recall  °

## 2017-10-16 NOTE — Progress Notes (Signed)
Subjective:    Patient ID: Christine Leblanc, female    DOB: 1929-06-29, 83 y.o.   MRN: [id number]  Redmond School, MD   HPI WEIGHT GOT DOWN TO 100 LBS AND NOW UP TO 108 LBS. WEIGHED 118 LBS IN DEC 2017. STILL HAVING TROUBLE SWALLOWING. HASN'T BEEN BACK TO [facility] AND ISN'T SAD. FOLLOWING SWALLOWING EXERCISES FROM DABNEY BUT DOESN'T LIKELY TO CRUSH HER PILLS.  WILL DRINK MILKSHAKES. HAVING TROUBLE SAVING FOOD FROM THE STORM. HAVING TROUBLE WITH [facility]. GRANDSON STOPPED THE NOISE. TELEPHONES [place]. NOT BEEN ABLE TO DO EXERCISES ALL THE TIME. TAKING DULCOLAX TO PREVENT CONSTIPATION & DUE TO A RECTOCELE. NEEDS ALLERGY SHOT TODAY.   PT DENIES FEVER, CHILLS, HEMATOCHEZIA, nausea, vomiting, melena, diarrhea, CHEST PAIN, SHORTNESS OF BREATH,  CHANGE IN BOWEL IN HABITS, abdominal pain, OR heartburn or indigestion.  Past Medical History:  Diagnosis Date  . Anemia   . Asthma   . Cancer ([place])    skin Ca- ? basal cell   . Chronic back pain   . Chronic neck pain   . Degenerative joint disease    Left shoulder; cervical spine, knees & hands   . Gastroesophageal reflux disease    Hiatal hernia; distal esophageal web requiring dilatation; gastric polyps; gastritis; refuses colonoscopy  . GERD (gastroesophageal reflux disease)   . History of stress test 1990's   stress test done under the care of Dr. Lattie Haw & Dr. Gwenlyn Found, now being followed by Dr. Harl Bowie- in [place] , recently seen & told to f/U in one yr.   . Hyperlipidemia 04/27/2011  . Hypertension   . Hypothyroidism   . Lymphocytic thyroiditis   . Multiple thyroid nodules 2010   Adenomatous; thyroidectomy in 2010  . Osteoarthritis of right knee 07/27/2014  . Pneumonia    hosp. for pneumonia- long time ago   . Primary localized osteoarthrosis of right shoulder 06/21/2015  . Seizures ([place])    yes- as a child- & into adult years, states she took med. for them at one time, stopped at 32 yrs. of age   . Syncope and collapse 2007   Possible CVA in 2012 with left lower extremity weakness; refused hospitalization; CT-Atrophy and chronic microvascular ischemic change.     Past Surgical History:  Procedure Laterality Date  . ABDOMINAL HYSTERECTOMY     fibroids  . CATARACT EXTRACTION     Bilateral; redo surgery on the right for incomplete primary procedure  . COLONOSCOPY  Remote  . ESOPHAGOGASTRODUODENOSCOPY  12/10   Dr. 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 ESOPHAGOGASTRODUODENOSCOPY N/A 03/21/2015   Procedure: ESOPHAGOGASTRODUODENOSCOPY (EGD);  Surgeon: Danie Binder, MD;  [facility];  Service: Endoscopy;  Laterality: N/A;  1130   . EYE SURGERY    . PROLAPSED UTERINE FIBROID LIGATION     outcomed with rectocele & cystocele  . SAVORY DILATION N/A 03/21/2015   Procedure: SAVORY DILATION;  Surgeon: Danie Binder, MD;  [facility];  Service: Endoscopy;  Laterality: N/A;  . SHOULDER SURGERY     left  . TOTAL KNEE ARTHROPLASTY Right 07/27/2014   Procedure: RIGHT TOTAL KNEE ARTHROPLASTY;  Surgeon: Johnny Bridge, MD;  Location: [facility];  Service: Orthopedics;  Laterality: Right;  . TOTAL SHOULDER ARTHROPLASTY Right 06/21/2015   Procedure: RIGHT TOTAL SHOULDER ARTHROPLASTY;  Surgeon: Marchia Bond, MD;  Location: [facility];  Service: Orthopedics;  Laterality: Right;  . TOTAL THYROIDECTOMY  2010   Allergies  Allergen Reactions  . Celecoxib Shortness Of Breath  . Dexilant [Dexlansoprazole] Anaphylaxis    abd pain   Current Outpatient Prescriptions  Medication Sig Dispense Refill  . albuterol MCG/ACT inhaler 1-2 puffsQ6H PRN     . NORVASC 2.5 MG tablet Take 2.5 mg QD    . Ascorbic Acid (VITAMIN C PO) Take 1 tablet Daily.    Marland Kitchen aspirin 81 MG tablet Take 81 mg daily.    . DULCOLAX PO Take 1 tablet QHS     . calcium-vitamin D 500-200  Take 1 tablet BID     . diazepam (VALIUM) 5 MG tablet Take one tablet by mouth at bedtime as needed for sleep       . Glucosamine HCl (GLUCOSAMINE PO) Take 1 tablet by mouth 2 (two) times daily.    Marland Kitchen ipratropium (ATROVENT) 0.03 % nasal spray     . levocetirizine (XYZAL) 5 MG tablet Take 5 mg by mouth every evening.      Marland Kitchen SYNTHROID 75 MCG tablet TAKE 1 DAILY    . SINGULAIR 10 MG tablet Take 10 mg QHS at bedtime.    Marland Kitchen omeprazole 20 MG capsule 1 PO 30 mins BID    . pravastatin  80 MG tablet TAKE 1 QPM    . VITAMIN B-6 100 MG tablet Take 100 mg QHS    . vitamin B-12 000 MCG tablet Take 1 QAM    . vitamin E 400 UNIT capsule Take 400 Units by mouth at bedtime.      Review of Systems PER HPI OTHERWISE ALL SYSTEMS ARE NEGATIVE.    Objective:   Physical Exam  Constitutional: She is oriented to person, place, and time. She appears well-developed and well-nourished. No distress.  HENT:  Head: Normocephalic and atraumatic.  Mouth/Throat: Oropharynx is clear and moist. No oropharyngeal exudate.  Eyes: Pupils are equal, round, and reactive to light. No scleral icterus.  Neck: Normal range of motion. Neck supple.  Cardiovascular: Normal rate, regular rhythm and normal heart sounds.   Pulmonary/Chest: Effort normal and breath sounds normal. No respiratory distress.  Abdominal: Soft. Bowel sounds are normal. She exhibits no distension. There is no tenderness.  Musculoskeletal: She exhibits no edema.  Lymphadenopathy:    She has no cervical adenopathy.  Neurological: She is alert and oriented to person, place, and time.  NO  NEW FOCAL DEFICITS  Psychiatric: She has a normal mood and affect.  Vitals reviewed.     Assessment & Plan:

## 2017-10-16 NOTE — Progress Notes (Signed)
cc'ed to pcp °

## 2017-10-21 ENCOUNTER — Encounter (HOSPITAL_BASED_OUTPATIENT_CLINIC_OR_DEPARTMENT_OTHER): Payer: Medicare Other | Admitting: Oncology

## 2017-10-21 ENCOUNTER — Encounter (HOSPITAL_COMMUNITY): Payer: Self-pay | Admitting: Oncology

## 2017-10-21 ENCOUNTER — Encounter (HOSPITAL_COMMUNITY): Payer: Medicare Other | Attending: Oncology

## 2017-10-21 DIAGNOSIS — D472 Monoclonal gammopathy: Secondary | ICD-10-CM

## 2017-10-21 DIAGNOSIS — D649 Anemia, unspecified: Secondary | ICD-10-CM | POA: Diagnosis not present

## 2017-10-21 LAB — CBC WITH DIFFERENTIAL/PLATELET
Basophils Absolute: 0.1 10*3/uL (ref 0.0–0.1)
Basophils Relative: 1 %
Eosinophils Absolute: 0.8 10*3/uL — ABNORMAL HIGH (ref 0.0–0.7)
Eosinophils Relative: 11 %
HCT: 31.9 % — ABNORMAL LOW (ref 36.0–46.0)
HEMOGLOBIN: 10.3 g/dL — AB (ref 12.0–15.0)
LYMPHS PCT: 17 %
Lymphs Abs: 1.3 10*3/uL (ref 0.7–4.0)
MCH: 29.3 pg (ref 26.0–34.0)
MCHC: 32.3 g/dL (ref 30.0–36.0)
MCV: 90.6 fL (ref 78.0–100.0)
MONOS PCT: 8 %
Monocytes Absolute: 0.6 10*3/uL (ref 0.1–1.0)
NEUTROS PCT: 63 %
Neutro Abs: 4.7 10*3/uL (ref 1.7–7.7)
Platelets: 186 10*3/uL (ref 150–400)
RBC: 3.52 MIL/uL — AB (ref 3.87–5.11)
RDW: 14.2 % (ref 11.5–15.5)
WBC: 7.4 10*3/uL (ref 4.0–10.5)

## 2017-10-21 LAB — COMPREHENSIVE METABOLIC PANEL
ALK PHOS: 75 U/L (ref 38–126)
ALT: 17 U/L (ref 14–54)
AST: 24 U/L (ref 15–41)
Albumin: 3.5 g/dL (ref 3.5–5.0)
Anion gap: 7 (ref 5–15)
BILIRUBIN TOTAL: 0.7 mg/dL (ref 0.3–1.2)
BUN: 18 mg/dL (ref 6–20)
CALCIUM: 9 mg/dL (ref 8.9–10.3)
CO2: 28 mmol/L (ref 22–32)
Chloride: 104 mmol/L (ref 101–111)
Creatinine, Ser: 0.77 mg/dL (ref 0.44–1.00)
Glucose, Bld: 94 mg/dL (ref 65–99)
Potassium: 3.4 mmol/L — ABNORMAL LOW (ref 3.5–5.1)
Sodium: 139 mmol/L (ref 135–145)
TOTAL PROTEIN: 6.9 g/dL (ref 6.5–8.1)

## 2017-10-21 NOTE — Progress Notes (Signed)
[place] Initial Visit:  Patient Care Team: Redmond School, MD as PCP - General (Internal Medicine) Danie Binder, MD (Gastroenterology) Lattie Haw, Cristopher Estimable, MD (Cardiology) Elsie Saas, MD (Orthopedic Surgery) Marchia Bond, MD (Orthopedic Surgery) Elayne Snare, MD as Attending Physician (Endocrinology) Harold Hedge, Darrick Grinder, MD as Consulting Physician (Allergy and Immunology)  CHIEF COMPLAINTS/PURPOSE OF CONSULTATION: Monoclonal gammopathy  HISTORY OF PRESENTING ILLNESS: RENNEE Leblanc 83 y.o. female Presents today for evaluation of monoclonal gammopathy. Patient was seen her neurologist who checked a myeloma panel which demonstrated that patient had IgG monoclonal protein with kappa light chain specificity on immunofixation. SPEP demonstrated 0.2 g/dL spike. CMP from 05/21/17 demonstrated creatinine 0.76, calcium 9.2.   INTERVAL HISTORY: Patient presents today for continued follow up. She has been losing weight and lost 3 lbs since her last visit. She states she has difficulty eating due to pain from her dentures and it takes her a long time to chew. She has chronic arthritic pains in her hands and various places in her body which is unchanged. She denies any chest pain, shortness breath, abdominal pain, focal weakness.  Review of Systems  Constitutional: Positive for unexpected weight change (weight loss). Negative for appetite change, chills, fatigue and fever.  HENT:   Negative for hearing loss, lump/mass, mouth sores, sore throat and tinnitus.   Eyes: Negative for eye problems and icterus.  Respiratory: Negative for chest tightness, cough, hemoptysis, shortness of breath and wheezing.   Cardiovascular: Negative for chest pain, leg swelling and palpitations.  Gastrointestinal: Negative for abdominal distention, abdominal pain, blood in stool, diarrhea, nausea and vomiting.  Endocrine: Negative.  Negative for hot flashes.  Genitourinary: Negative for  difficulty urinating, frequency and hematuria.   Musculoskeletal: Positive for arthralgias. Negative for neck pain.  Skin: Negative for itching and rash.  Neurological: Negative for dizziness, headaches and speech difficulty.  Hematological: Negative for adenopathy. Does not bruise/bleed easily.  Psychiatric/Behavioral: Negative for confusion. The patient is not nervous/anxious.     MEDICAL HISTORY: Past Medical History:  Diagnosis Date  . Anemia   . Asthma   . Cancer ([place])    skin Ca- ? basal cell   . Chronic back pain   . Chronic neck pain   . Degenerative joint disease    Left shoulder; cervical spine, knees & hands   . Gastroesophageal reflux disease    Hiatal hernia; distal esophageal web requiring dilatation; gastric polyps; gastritis; refuses colonoscopy  . GERD (gastroesophageal reflux disease)   . History of stress test 1990's   stress test done under the care of Dr. Lattie Haw & Dr. Gwenlyn Found, now being followed by Dr. Harl Bowie- in [place] , recently seen & told to f/U in one yr.   . Hyperlipidemia 04/27/2011  . Hypertension   . Hypothyroidism   . Lymphocytic thyroiditis   . Multiple thyroid nodules 2010   Adenomatous; thyroidectomy in 2010  . Osteoarthritis of right knee 07/27/2014  . Pneumonia    hosp. for pneumonia- long time ago   . Primary localized osteoarthrosis of right shoulder 06/21/2015  . Seizures ([place])    yes- as a child- & into adult years, states she took med. for them at one time, stopped at 32 yrs. of age   . Syncope and collapse 2007   Possible CVA in 2012 with left lower extremity weakness; refused hospitalization; CT-Atrophy and chronic microvascular ischemic change.     SURGICAL HISTORY: Past Surgical History:  Procedure Laterality Date  . ABDOMINAL HYSTERECTOMY  fibroids  . CATARACT EXTRACTION     Bilateral; redo surgery on the right for incomplete primary procedure  . COLONOSCOPY  Remote  . ESOPHAGOGASTRODUODENOSCOPY  12/10   Dr. Oneida Alar:  probable distal web s/p dilation small hiatal hernia/gastric polyps/mild gastritis  . ESOPHAGOGASTRODUODENOSCOPY N/A 03/21/2015   Procedure: ESOPHAGOGASTRODUODENOSCOPY (EGD);  Surgeon: Danie Binder, MD;  [facility];  Service: Endoscopy;  Laterality: N/A;  1130   . EYE SURGERY    . PROLAPSED UTERINE FIBROID LIGATION     outcomed with rectocele & cystocele  . SAVORY DILATION N/A 03/21/2015   Procedure: SAVORY DILATION;  Surgeon: Danie Binder, MD;  [facility]: [facility];  Service: Endoscopy;  Laterality: N/A;  . SHOULDER SURGERY     left  . TOTAL KNEE ARTHROPLASTY Right 07/27/2014   Procedure: RIGHT TOTAL KNEE ARTHROPLASTY;  Surgeon: Johnny Bridge, MD;  Location: [facility];  Service: Orthopedics;  Laterality: Right;  . TOTAL SHOULDER ARTHROPLASTY Right 06/21/2015   Procedure: RIGHT TOTAL SHOULDER ARTHROPLASTY;  Surgeon: Marchia Bond, MD;  Location: [facility];  Service: Orthopedics;  Laterality: Right;  . TOTAL THYROIDECTOMY  2010    SOCIAL HISTORY: Social History   Social History  . Marital status: Widowed    Spouse name: N/A  . Number of children: 4  . Years of education: 4   Occupational History  . 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 Retired     Marine scientist   Social History Main Topics  . Smoking status: Former Smoker    Packs/day: 0.80    Years: 20.00    Types: Cigarettes    Quit date: 12/31/1978  . Smokeless tobacco: Never Used  . Alcohol use No  . Drug use: No  . Sexual activity: No     Comment: widowed since 2010   Other Topics Concern  . Not on file   Social History Narrative   Lives with daughter   Caffeine use: 1-2 cups coffee per day       FAMILY HISTORY Family History  Problem Relation Age of Onset  . Heart disease Mother   . Gallbladder disease Mother   . Heart failure Mother   . Breast cancer Sister        30 years ago  . Heart attack Father   . Colon cancer Neg Hx   . Stroke Neg Hx     ALLERGIES:  is allergic to celecoxib and  dexilant [dexlansoprazole].  MEDICATIONS:  Current Outpatient Prescriptions  Medication Sig Dispense Refill  . albuterol (PROVENTIL HFA) 108 (90 BASE) MCG/ACT inhaler Inhale 1-2 puffs into the lungs every 6 (six) hours as needed for wheezing or shortness of breath. Wheezing, Asthma Symptoms    . amLODipine (NORVASC) 2.5 MG tablet Take 2.5 mg by mouth daily.    . Ascorbic Acid (VITAMIN C PO) Take 1 tablet by mouth daily.    Marland Kitchen aspirin 81 MG tablet Take 81 mg by mouth daily.    . Bisacodyl (DULCOLAX PO) Take 1 tablet by mouth at bedtime.    . calcium-vitamin D (OSCAL WITH D) 500-200 MG-UNIT per tablet Take 1 tablet by mouth 2 (two) times daily.     . diazepam (VALIUM) 5 MG tablet Take one tablet by mouth at bedtime as needed for sleep (Patient taking differently: Take 2.5-5 mg by mouth at bedtime as needed (sleep). ) 30 tablet 5  . Glucosamine HCl (GLUCOSAMINE PO) Take 1 tablet by mouth 2 (two)  times daily.    Marland Kitchen ipratropium (ATROVENT) 0.03 % nasal spray     . levocetirizine (XYZAL) 5 MG tablet Take 5 mg by mouth every evening.      Marland Kitchen levothyroxine (SYNTHROID, LEVOTHROID) 75 MCG tablet TAKE 1 TABLET DAILY 90 tablet 2  . montelukast (SINGULAIR) 10 MG tablet Take 10 mg by mouth at bedtime.    Marland Kitchen omeprazole (PRILOSEC) 20 MG capsule 1 PO 30 mins prior to breakfast and supper 180 capsule 3  . pravastatin (PRAVACHOL) 80 MG tablet TAKE 1 TABLET EVERY EVENING 90 tablet 2  . pyridOXINE (VITAMIN B-6) 100 MG tablet Take 100 mg by mouth at bedtime.    . vitamin B-12 (CYANOCOBALAMIN) 1000 MCG tablet Take 1,000 mcg by mouth every morning.    . vitamin E 400 UNIT capsule Take 400 Units by mouth at bedtime.      No current facility-administered medications for this visit.     PHYSICAL EXAMINATION:  ECOG PERFORMANCE STATUS: 0 - Asymptomatic   Vitals:   10/21/17 1532  BP: 130/67  Pulse: 91  Resp: 18  SpO2: 100%    Filed Weights   10/21/17 1532  Weight: 109 lb (49.4 kg)     Physical Exam   Constitutional: She is oriented to person, place, and time. No distress.  Thin elderly female appears stated age  HENT:  Head: Normocephalic and atraumatic.  Mouth/Throat: No oropharyngeal exudate.  Eyes: Pupils are equal, round, and reactive to light. Conjunctivae are normal. No scleral icterus.  Neck: Normal range of motion. Neck supple. No JVD present.  Cardiovascular: Normal rate, regular rhythm and normal heart sounds.  Exam reveals no gallop and no friction rub.   No murmur heard. Pulmonary/Chest: Breath sounds normal. No respiratory distress. She has no wheezes. She has no rales.  Abdominal: Soft. Bowel sounds are normal. She exhibits no distension. There is no tenderness. There is no guarding.  Musculoskeletal: She exhibits no edema or tenderness.  Lymphadenopathy:    She has no cervical adenopathy.  Neurological: She is alert and oriented to person, place, and time. No cranial nerve deficit.  Skin: Skin is warm and dry. No rash noted. No erythema. No pallor.  Psychiatric: Affect and judgment normal.     LABORATORY DATA: I have personally reviewed the data as listed:  Appointment on 10/21/2017  Component Date Value Ref Range Status  . WBC 10/21/2017 7.4  4.0 - 10.5 K/uL Final  . RBC 10/21/2017 3.52* 3.87 - 5.11 MIL/uL Final  . Hemoglobin 10/21/2017 10.3* 12.0 - 15.0 g/dL Final  . HCT 10/21/2017 31.9* 36.0 - 46.0 % Final  . MCV 10/21/2017 90.6  78.0 - 100.0 fL Final  . MCH 10/21/2017 29.3  26.0 - 34.0 pg Final  . MCHC 10/21/2017 32.3  30.0 - 36.0 g/dL Final  . RDW 10/21/2017 14.2  11.5 - 15.5 % Final  . Platelets 10/21/2017 186  150 - 400 K/uL Final  . Neutrophils Relative % 10/21/2017 63  % Final  . Neutro Abs 10/21/2017 4.7  1.7 - 7.7 K/uL Final  . Lymphocytes Relative 10/21/2017 17  % Final  . Lymphs Abs 10/21/2017 1.3  0.7 - 4.0 K/uL Final  . Monocytes Relative 10/21/2017 8  % Final  . Monocytes Absolute 10/21/2017 0.6  0.1 - 1.0 K/uL Final  . Eosinophils Relative  10/21/2017 11  % Final  . Eosinophils Absolute 10/21/2017 0.8* 0.0 - 0.7 K/uL Final  . Basophils Relative 10/21/2017 1  % Final  . Basophils  Absolute 10/21/2017 0.1  0.0 - 0.1 K/uL Final  . Sodium 10/21/2017 139  135 - 145 mmol/L Final  . Potassium 10/21/2017 3.4* 3.5 - 5.1 mmol/L Final  . Chloride 10/21/2017 104  101 - 111 mmol/L Final  . CO2 10/21/2017 28  22 - 32 mmol/L Final  . Glucose, Bld 10/21/2017 94  65 - 99 mg/dL Final  . BUN 10/21/2017 18  6 - 20 mg/dL Final  . Creatinine, Ser 10/21/2017 0.77  0.44 - 1.00 mg/dL Final  . Calcium 10/21/2017 9.0  8.9 - 10.3 mg/dL Final  . Total Protein 10/21/2017 6.9  6.5 - 8.1 g/dL Final  . Albumin 10/21/2017 3.5  3.5 - 5.0 g/dL Final  . AST 10/21/2017 24  15 - 41 U/L Final  . ALT 10/21/2017 17  14 - 54 U/L Final  . Alkaline Phosphatase 10/21/2017 75  38 - 126 U/L Final  . Total Bilirubin 10/21/2017 0.7  0.3 - 1.2 mg/dL Final  . GFR calc non Af Amer 10/21/2017 >60  >60 mL/min Final  . GFR calc Af Amer 10/21/2017 >60  >60 mL/min Final   Comment: (NOTE) The eGFR has been calculated using the CKD EPI equation. This calculation has not been validated in all clinical situations. eGFR's persistently <60 mL/min signify possible Chronic Kidney Disease.   . Anion gap 10/21/2017 7  5 - 15 Final    RADIOGRAPHIC STUDIES: I have personally reviewed the radiological images as listed and agree with the findings in the report  No results found.  ASSESSMENT: 1. Monoclonal gammopathy of unknown significance: IgG monoclonal protein with kappa light chain specificity on immunofixation. SPEP demonstrated 0.2 g/dL spike. 2. Normocytic anemia 3. No evidence of hypercalcemia, renal dysfunction, or bone issues.  PLAN: MGUS is stable at this time. SPEP demonstrated a 0.3 g/dL spike in June 2018, awaiting SPEP results from today.  Reviewed labs in detail with the patient today. Her hemoglobin is stable in the 10 g/dL range.  I have told the patient that MGUS  has a 1% chance per year of progressing to MM.  Given her elderly age I would not be too aggressive since I doubt she will be able to tolerate treatment for myeloma well. Continue to monitor her labs.  Advised her to eat multiple small meals throughout the day and to eat softer foods and to take a multivitamin so she has all the nutrition she needs. I offered for her to see our nutritionist however patient declined. Return to clinic in 6 months for follow-up with repeat lab.  Orders Placed This Encounter  Procedures  . CBC with Differential    Standing Status:   Future    Standing Expiration Date:   10/21/2018  . Comprehensive metabolic panel    Standing Status:   Future    Standing Expiration Date:   10/21/2018  . Kappa/lambda light chains    Standing Status:   Future    Standing Expiration Date:   10/21/2018  . Beta 2 microglobuline, serum    Standing Status:   Future    Standing Expiration Date:   10/21/2018  . Multiple Myeloma Panel (SPEP&IFE w/QIG)    Standing Status:   Future    Standing Expiration Date:   10/21/2018    All questions were answered. The patient knows to call the clinic with any problems, questions or concerns.  This note was electronically signed.    Twana First, MD  10/21/2017 3:33 PM

## 2017-10-22 ENCOUNTER — Encounter (HOSPITAL_COMMUNITY): Payer: Self-pay | Admitting: Speech Pathology

## 2017-10-22 ENCOUNTER — Ambulatory Visit (HOSPITAL_COMMUNITY): Payer: Medicare Other | Attending: Internal Medicine | Admitting: Speech Pathology

## 2017-10-22 DIAGNOSIS — R1312 Dysphagia, oropharyngeal phase: Secondary | ICD-10-CM | POA: Diagnosis not present

## 2017-10-22 LAB — BETA 2 MICROGLOBULIN, SERUM: BETA 2 MICROGLOBULIN: 2.8 mg/L — AB (ref 0.6–2.4)

## 2017-10-22 LAB — KAPPA/LAMBDA LIGHT CHAINS
KAPPA, LAMDA LIGHT CHAIN RATIO: 1.6 (ref 0.26–1.65)
Kappa free light chain: 34.3 mg/L — ABNORMAL HIGH (ref 3.3–19.4)
Lambda free light chains: 21.5 mg/L (ref 5.7–26.3)

## 2017-10-22 NOTE — Therapy (Signed)
[place], [place], [address] Phone: [phone]   Fax:  [phone]  Speech Language Pathology Evaluation/Clinical Swallow Evaluation  Patient Details  Name: Christine Leblanc MRN: [id number] Date of Birth: December 16, 1929 No Data Recorded  Encounter Date: 10/22/2017      End of Session - 10/22/17 1832    Visit Number 1   Number of Visits 4   Authorization Type Medicare   SLP Start Time 1607   SLP Stop Time  1520   SLP Time Calculation (min) 44 min   Activity Tolerance Patient tolerated treatment well      Past Medical History:  Diagnosis Date  . Anemia   . Asthma   . Cancer ([place])    skin Ca- ? basal cell   . Chronic back pain   . Chronic neck pain   . Degenerative joint disease    Left shoulder; cervical spine, knees & hands   . Gastroesophageal reflux disease    Hiatal hernia; distal esophageal web requiring dilatation; gastric polyps; gastritis; refuses colonoscopy  . GERD (gastroesophageal reflux disease)   . History of stress test 1990's   stress test done under the care of Dr. Lattie Haw & Dr. Gwenlyn Found, now being followed by Dr. Harl Bowie- in [place] , recently seen & told to f/U in one yr.   . Hyperlipidemia 04/27/2011  . Hypertension   . Hypothyroidism   . Lymphocytic thyroiditis   . Multiple thyroid nodules 2010   Adenomatous; thyroidectomy in 2010  . Osteoarthritis of right knee 07/27/2014  . Pneumonia    hosp. for pneumonia- long time ago   . Primary localized osteoarthrosis of right shoulder 06/21/2015  . Seizures ([name])    yes- as a child- & into adult years, states she took med. for them at one time, stopped at 32 yrs. of age   . Syncope and collapse 2007   Possible CVA in 2012 with left lower extremity weakness; refused hospitalization; CT-Atrophy and chronic microvascular ischemic change.     Past Surgical History:  Procedure Laterality Date  . ABDOMINAL HYSTERECTOMY     fibroids  .  CATARACT EXTRACTION     Bilateral; redo surgery on the right for incomplete primary procedure  . COLONOSCOPY  Remote  . ESOPHAGOGASTRODUODENOSCOPY  12/10   Dr. 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 ESOPHAGOGASTRODUODENOSCOPY N/A 03/21/2015   Procedure: ESOPHAGOGASTRODUODENOSCOPY (EGD);  Surgeon: Danie Binder, MD;  [facility];  Service: Endoscopy;  Laterality: N/A;  1130   . EYE SURGERY    . PROLAPSED UTERINE FIBROID LIGATION     outcomed with rectocele & cystocele  . SAVORY DILATION N/A 03/21/2015   Procedure: SAVORY DILATION;  Surgeon: Danie Binder, MD;  [facility];  Service: Endoscopy;  Laterality: N/A;  . SHOULDER SURGERY     left  . TOTAL KNEE ARTHROPLASTY Right 07/27/2014   Procedure: RIGHT TOTAL KNEE ARTHROPLASTY;  Surgeon: Johnny Bridge, MD;  Location: [place];  Service: Orthopedics;  Laterality: Right;  . TOTAL SHOULDER ARTHROPLASTY Right 06/21/2015   Procedure: RIGHT TOTAL SHOULDER ARTHROPLASTY;  Surgeon: Marchia Bond, MD;  Location: [place];  Service: Orthopedics;  Laterality: Right;  . TOTAL THYROIDECTOMY  2010    There were no vitals filed for this visit.      Subjective Assessment - 10/22/17 1823    Subjective "I want to know if you think I am doing better or  worse."   Currently in Pain? No/denies               Prior Functional Status - 10/22/17 1828      Prior Functional Status   Cognitive/Linguistic Baseline Within functional limits   Type of Home House    Lives With Daughter  She helps care for her daughter   Education former RN   Vocation Retired         General - 10/22/17 [place]   Date of Onset 05/02/16   HPI Christine Leblanc is an 83 yo woman who was referred for MBSS by Dr. Oneida Alar. She is known to this SLP from previous MBSS completed May 2016 and subsequent dysphagia therapy. Pt is a former smoker (quit 37 years ago), had total  thyroidectomy in 2010, and had EGD with savory dilation 03/21/2015. She lives at home with her daughter (who has seizures and needs assist). She denies recent bouts of PNA, but is followed by Dr. Harold Hedge for allergies and asthma and Dr. Benjamine Mola for hearing loss in right ear. Pt has history of GERD, hiatal hernia, distal esophageal web requiring dilation, gastric polyps, and gastritis. She is a former Therapist, sports.    Type of Study Bedside Swallow Evaluation   Previous Swallow Assessment MBSS May 2017 D3/NTL and Mare Ferrari water protocol, MBSS 12/18/2016 D3/thin, FEES January and June 2018 D3/thin   Diet Prior to this Study Dysphagia 3 (soft);Thin liquids   Temperature Spikes Noted No   Respiratory Status Room air   History of Recent Intubation No   Behavior/Cognition Alert;Cooperative;Pleasant mood   Oral Cavity Assessment Within Functional Limits   Oral Care Completed by SLP No   Oral Cavity - Dentition Dentures, top   Vision Functional for self-feeding   Self-Feeding Abilities Able to feed self   Patient Positioning Upright in chair   Baseline Vocal Quality Breathy;Wet   Volitional Cough Strong   Volitional Swallow Able to elicit          Oral Motor/Sensory Function - 10/22/17 1831      Oral Motor/Sensory Function   Overall Oral Motor/Sensory Function Within functional limits         Ice Chips - 10/22/17 1831      Ice Chips   Ice chips Not tested         Thin Liquid - 10/22/17 1831      Thin Liquid   Thin Liquid Impaired   Presentation Self Fed;Cup  water bottle   Pharyngeal  Phase Impairments Decreased hyoid-laryngeal movement;Cough - Immediate   Other Comments also assessed with chin tuck and head turn left           SLP Education - 10/22/17 1824    Education provided Yes   Education Details Plan to implement pharyngeal exercises and 3 SLP sessions; S/sx aspiration and soft food ideas   Person(s) Educated Patient   Methods Explanation;Handout   Comprehension Verbalized  understanding;Need further instruction          SLP Short Term Goals - 10/22/17 1833      SLP SHORT TERM GOAL #1   Title Pt will complete oropharyngeal exercises as assigned 3x/day with use of written cue.   Time 3   Period Weeks   Status New     SLP SHORT TERM GOAL #2   Title Pt will demonstrate safe and efficient consumption of D3/mech soft diet with thin liquids with use of compensatory strategies (bolus hold,  head turn to LEFT with chin tuck) and min assist   Time 3   Period Weeks   Status New          SLP Long Term Goals - 10/22/17 1834      SLP LONG TERM GOAL #1   Title Same as short   Target Date 11/22/17     Most recent FEES visit with SLP at [facility]: <<Christine Leblanc continues to present with moderate pharyngeal dysphagia, largely unchanged from most recent FEES conducted on 01/21/17. Dysphagia is characterized by poor oral coordination with liquids resulting in premature spillage of the bolus over the base of tongue; decreased pharyngeal contraction; decreased supraglottic closure, appearance of poor UES compliance, and diminished laryngeal sensation. Moderate-severe, diffuse post-swallow bolus residual of puree noted in the pharynx that minimized with subsequent swallowing and a liquid wash. Trace penetration of puree noted that consistently cleared with subsequent, reflexive swallowing. Silent penetration and silent aspiration noted with thin liquid (Ensure> water) during the swallow. Christine Leblanc is unable to clear aspirate from the airway with a cued cough; however, this did clear the majority of penetrated bolus material. Chin tuck posture is effective in reducing aspiration. Christine Leblanc is still at risk of aspiration of thin liquids with use of chin tuck + immediate cough strategy, however, this does reduce amount of aspirate. Christine Leblanc and daughter were thoroughly educated as to the results of today's evaluation, recommendations, and as to the importance of utilizing  compensatory techniques as well as independently implementing home program dysphagia treatment exercises. They were re-educated as to the risk associated with aspiration (I.e. Possible life-threatening pneumonia). Christine Leblanc and daughter verbalized understanding of all information provided.  Prognosis: Prognosis: Guarded due to: Pt age and history of non-compliance with dysphagia treatment exercises.  Recommendations: Diet: continue current diet, soft-solid, thin liquids. Risks of aspiration reviewed thoroughly during our session today. The importance of adhering diligently to swallowing strategies and home program exercises for swallowing preservation and reduction of aspiration risk is discussed at length. Pt verbalizes understanding.  Use chin tuck + immediate cough strategy with all sips of liquid Small bites and sips, alternate liquids and solids Administer Medications: crushed, if permissible, one at a time, with puree>>         Plan - 10/22/17 1833    Clinical Impression Statement Christine Leblanc continues to present with signs and symptoms of pharyngeal phase dysphagia. She has been followed on and off over the past couple years by SLP with little change over her course of treatments. She was last seen at [facility] for FEES (see just above) with little change from previous MBSS x2 and FEES.  Christine Leblanc acknowledges that she has not been completing swallowing exercises. Today's session focused on reinforcement of importance of adhering to previous recommendations. Pt required frequent re-phrasing and repetition of verbal information and was also provided written materials to aid in recall at home. It does not appear that Pt is experiencing a change in swallow function and it is unknown if she is maintaining her weight (she was not able to reliably state). Recommend continuing previously given exercises and 3 SLP sessions to ensure carryover at home. [facility] be completed [facility] if Pt notes  any change in her swallow.    Speech Therapy Frequency 1x /week   Duration --  3 weeks   Treatment/Interventions Aspiration precaution training;Pharyngeal strengthening exercises;Diet toleration management by SLP;Compensatory techniques;Cueing hierarchy;SLP instruction and feedback;Compensatory strategies;Patient/family education   Potential to Achieve Goals  Fair   Potential Considerations Severity of impairments;Ability to learn/carryover information;Previous level of function   SLP Home Exercise Plan Pt will be independent with HEP as assigned to facilitate carryover of treatment strategies in home environment to decrease risk of aspiration PNA   Consulted and Agree with Plan of Care Patient      Patient will benefit from skilled therapeutic intervention in order to improve the following deficits and impairments:   Dysphagia, oropharyngeal phase      G-Codes - 11-06-2017 1835    Functional Assessment Tool Used clinical judgment   Functional Limitations Swallowing   Swallow Current Status (U3833) At least 40 percent but less than 60 percent impaired, limited or restricted   Swallow Goal Status (X8329) At least 20 percent but less than 40 percent impaired, limited or restricted      Problem List Patient Active Problem List   Diagnosis Date Noted  . MGUS (monoclonal gammopathy of unknown significance) 10/21/2017  . Primary localized osteoarthrosis of right shoulder 06/21/2015  . S/P shoulder replacement 06/21/2015  . Osteoarthritis of right knee 07/27/2014  . Postsurgical hypothyroidism 04/22/2014  . Syncope and collapse   . Anemia, normocytic normochromic 04/26/2012  . Gastroesophageal reflux disease   . Hypertension 12/07/2011  . History of TIAs 12/07/2011  . Leg pain, bilateral 04/27/2011  . Hyperlipidemia 04/27/2011  . Other dysphagia 06/07/2010   Thank you,  Genene Churn, [place]  [facility] Nov 06, 2017, 6:36 PM  [place] [address] [place], [place], [address] Phone: [phone]   Fax:  [phone]  Name: Christine Leblanc MRN: [id number] Date of Birth: 19-Jul-1929

## 2017-10-23 LAB — MULTIPLE MYELOMA PANEL, SERUM
ALBUMIN SERPL ELPH-MCNC: 3 g/dL (ref 2.9–4.4)
ALPHA2 GLOB SERPL ELPH-MCNC: 1 g/dL (ref 0.4–1.0)
Albumin/Glob SerPl: 0.9 (ref 0.7–1.7)
Alpha 1: 0.3 g/dL (ref 0.0–0.4)
B-GLOBULIN SERPL ELPH-MCNC: 1 g/dL (ref 0.7–1.3)
Gamma Glob SerPl Elph-Mcnc: 1.3 g/dL (ref 0.4–1.8)
Globulin, Total: 3.5 g/dL (ref 2.2–3.9)
IgA: 315 mg/dL (ref 64–422)
IgG (Immunoglobin G), Serum: 1112 mg/dL (ref 700–1600)
IgM (Immunoglobulin M), Srm: 135 mg/dL (ref 26–217)
TOTAL PROTEIN ELP: 6.5 g/dL (ref 6.0–8.5)

## 2017-10-24 DIAGNOSIS — J45909 Unspecified asthma, uncomplicated: Secondary | ICD-10-CM | POA: Diagnosis not present

## 2017-10-28 ENCOUNTER — Telehealth (HOSPITAL_COMMUNITY): Payer: Self-pay | Admitting: Internal Medicine

## 2017-10-28 NOTE — Telephone Encounter (Signed)
10/28/17  having back surgery on 11/6 - he said it would be over a month before he got back in here for therapy but does want to come back for therapy

## 2017-10-29 ENCOUNTER — Ambulatory Visit (HOSPITAL_COMMUNITY): Payer: Medicare Other | Admitting: Speech Pathology

## 2017-10-31 ENCOUNTER — Ambulatory Visit (HOSPITAL_COMMUNITY): Payer: Medicare Other | Attending: Internal Medicine | Admitting: Speech Pathology

## 2017-10-31 ENCOUNTER — Encounter (HOSPITAL_COMMUNITY): Payer: Self-pay | Admitting: Speech Pathology

## 2017-10-31 ENCOUNTER — Telehealth (HOSPITAL_COMMUNITY): Payer: Self-pay | Admitting: Internal Medicine

## 2017-10-31 DIAGNOSIS — R1312 Dysphagia, oropharyngeal phase: Secondary | ICD-10-CM

## 2017-10-31 NOTE — Addendum Note (Signed)
Addended by: Ephraim Hamburger on: 10/31/2017 08:34 PM   Modules accepted: Orders

## 2017-10-31 NOTE — Telephone Encounter (Signed)
10/31/17  Per SP pathologist the note entered on 10/28/17 was an error... I posted the wrong information on Ms. Stlaurent record and should have gone on another patient's record

## 2017-10-31 NOTE — Therapy (Signed)
[place], [place], [address] Phone: [phone]   Fax:  [phone]  Speech Language Pathology Treatment  Patient Details  Name: Christine Leblanc MRN: [id number] Date of Birth: [birth year] No Data Recorded  Encounter Date: 10/31/2017      End of Session - 10/31/17 2037    Visit Number 1   Number of Visits 4   Authorization Type Medicare   SLP Start Time 1430   SLP Stop Time  4854   SLP Time Calculation (min) 45 min   Activity Tolerance Patient tolerated treatment well      Past Medical History:  Diagnosis Date  . Anemia   . Asthma   . Cancer ([place])    skin Ca- ? basal cell   . Chronic back pain   . Chronic neck pain   . Degenerative joint disease    Left shoulder; cervical spine, knees & hands   . Gastroesophageal reflux disease    Hiatal hernia; distal esophageal web requiring dilatation; gastric polyps; gastritis; refuses colonoscopy  . GERD (gastroesophageal reflux disease)   . History of stress test 1990's   stress test done under the care of Dr. Lattie Leblanc & Dr. Gwenlyn Leblanc, now being followed by Dr. Harl Leblanc- in [place] , recently seen & told to f/U in one yr.   . Hyperlipidemia 04/27/2011  . Hypertension   . Hypothyroidism   . Lymphocytic thyroiditis   . Multiple thyroid nodules 2010   Adenomatous; thyroidectomy in 2010  . Osteoarthritis of right knee 07/27/2014  . Pneumonia    hosp. for pneumonia- long time ago   . Primary localized osteoarthrosis of right shoulder 06/21/2015  . Seizures ([place])    yes- as a child- & into adult years, states she took med. for them at one time, stopped at 32 yrs. of age   . Syncope and collapse 2007   Possible CVA in 2012 with left lower extremity weakness; refused hospitalization; CT-Atrophy and chronic microvascular ischemic change.     Past Surgical History:  Procedure Laterality Date  . ABDOMINAL HYSTERECTOMY     fibroids  . CATARACT EXTRACTION     Bilateral;  redo surgery on the right for incomplete primary procedure  . COLONOSCOPY  Remote  . ESOPHAGOGASTRODUODENOSCOPY  12/10   Christine Leblanc: probable distal web s/p dilation small hiatal hernia/gastric polyps/mild gastritis  . ESOPHAGOGASTRODUODENOSCOPY N/A 03/21/2015   Procedure: ESOPHAGOGASTRODUODENOSCOPY (EGD);  Surgeon: Christine Binder, MD;  [facility];  Service: Endoscopy;  Laterality: N/A;  1130   . EYE SURGERY    . PROLAPSED UTERINE FIBROID LIGATION     outcomed with rectocele & cystocele  . SAVORY DILATION N/A 03/21/2015   Procedure: SAVORY DILATION;  Surgeon: Christine Binder, MD;  [facility];  Service: Endoscopy;  Laterality: N/A;  . SHOULDER SURGERY     left  . TOTAL KNEE ARTHROPLASTY Right 07/27/2014   Procedure: RIGHT TOTAL KNEE ARTHROPLASTY;  Surgeon: Johnny Bridge, MD;  Location: [place];  Service: Orthopedics;  Laterality: Right;  . TOTAL SHOULDER ARTHROPLASTY Right 06/21/2015   Procedure: RIGHT TOTAL SHOULDER ARTHROPLASTY;  Surgeon: Marchia Bond, MD;  Location: [place];  Service: Orthopedics;  Laterality: Right;  . TOTAL THYROIDECTOMY  2010    There were no vitals filed for this visit.      Subjective Assessment - 10/31/17 2035    Subjective "I don't really have time to do my exercises."   Currently in Pain?  No/denies               ADULT SLP TREATMENT - 10/31/17 0001      General Information   Behavior/Cognition Alert;Cooperative;Pleasant mood   Patient Positioning Upright in chair   Oral care provided N/A   HPI [name] is an 83 yo woman who was referred for MBSS by Christine Leblanc. She is known to this SLP from previous MBSS completed May 2016 and subsequent dysphagia therapy. Pt is a former smoker (quit 37 years ago), had total thyroidectomy in 2010, and had EGD with savory dilation 03/21/2015. She lives at home with her daughter (who has seizures and needs assist). She denies recent bouts of PNA, but is followed by Dr. Harold Leblanc for  allergies and asthma and Dr. Benjamine Leblanc for hearing loss in right ear. Pt has history of GERD, hiatal hernia, distal esophageal web requiring dilation, gastric polyps, and gastritis. She is a former Therapist, sports.      Treatment Provided   Treatment provided Dysphagia     Dysphagia Treatment   Temperature Spikes Noted No   Respiratory Status Room air   Oral Cavity - Dentition Adequate natural dentition   Treatment Methods Skilled observation;Compensation strategy training;Therapeutic exercise   Patient observed directly with PO's Yes   Type of PO's observed Thin liquids   Feeding Able to feed self   Liquids provided via Cup   Pharyngeal Phase Signs & Symptoms Suspected delayed swallow initiation;Multiple swallows;Wet vocal quality   Type of cueing Verbal   Amount of cueing Moderate     Pain Assessment   Pain Assessment No/denies pain     Progression Toward Goals   Progression toward goals Progressing toward goals            SLP Short Term Goals - 10/31/17 2042      SLP SHORT TERM GOAL #1   Title Pt will complete oropharyngeal exercises as assigned 3x/day with use of written cue.   Time 3   Period Weeks   Status On-going     SLP SHORT TERM GOAL #2   Title Pt will demonstrate safe and efficient consumption of D3/mech soft diet with thin liquids with use of compensatory strategies (bolus hold, head turn to LEFT with chin tuck) and min assist   Time 3   Period Weeks   Status On-going          SLP Long Term Goals - 10/22/17 1834      SLP LONG TERM GOAL #1   Title Same as short   Target Date 11/22/17          Plan - 10/31/17 2038    Clinical Impression Statement [name] required verbal reinforcement of last week's recommendations despite written cues in her folder. She stated that she didn't really have time to complete the swallowing exercises. I reviewed previous SLP recommendations for 20 repetitions of each exercise 3x/day and encouraged Pt to aim for 10 of each for right  now (as she is not doing any). All exercises were demonstrated by SLP and return demonstrated by Pt with mod verbal and tactile SLP cues (effortful, Masako, CTAR modifcation, and lingual press). Pt continues to need direct cues for implementation of exercises. Continue POC.    Speech Therapy Frequency 1x /week   Duration --  3 weeks   Treatment/Interventions Aspiration precaution training;Pharyngeal strengthening exercises;Diet toleration management by SLP;Compensatory techniques;Cueing hierarchy;SLP instruction and feedback;Compensatory strategies;Patient/family education   Potential to Achieve Goals Fair   Potential  Considerations Severity of impairments;Ability to learn/carryover information;Previous level of function   SLP Home Exercise Plan Pt will be independent with HEP as assigned to facilitate carryover of treatment strategies in home environment to decrease risk of aspiration PNA   Consulted and Agree with Plan of Care Patient      Patient will benefit from skilled therapeutic intervention in order to improve the following deficits and impairments:   Dysphagia, oropharyngeal phase    Problem List Patient Active Problem List   Diagnosis Date Noted  . MGUS (monoclonal gammopathy of unknown significance) 10/21/2017  . Primary localized osteoarthrosis of right shoulder 06/21/2015  . S/P shoulder replacement 06/21/2015  . Osteoarthritis of right knee 07/27/2014  . Postsurgical hypothyroidism 04/22/2014  . Syncope and collapse   . Anemia, normocytic normochromic 04/26/2012  . Gastroesophageal reflux disease   . Hypertension 12/07/2011  . History of TIAs 12/07/2011  . Leg pain, bilateral 04/27/2011  . Hyperlipidemia 04/27/2011  . Other dysphagia 06/07/2010   Thank you,  Genene Churn, [place]  [facility] 10/31/2017, 8:43 PM  [place] [address] [place], [place], [address] Phone: [phone]   Fax:   [phone]   Name: Christine Leblanc MRN: [id number] Date of Birth: 08-Jan-1929

## 2017-11-06 DIAGNOSIS — J45909 Unspecified asthma, uncomplicated: Secondary | ICD-10-CM | POA: Diagnosis not present

## 2017-11-14 ENCOUNTER — Ambulatory Visit (HOSPITAL_COMMUNITY): Payer: Medicare Other | Admitting: Speech Pathology

## 2017-11-14 ENCOUNTER — Other Ambulatory Visit: Payer: Self-pay

## 2017-11-14 ENCOUNTER — Encounter (HOSPITAL_COMMUNITY): Payer: Self-pay | Admitting: Speech Pathology

## 2017-11-14 DIAGNOSIS — R1312 Dysphagia, oropharyngeal phase: Secondary | ICD-10-CM

## 2017-11-14 NOTE — Therapy (Signed)
[facility] [place], [place], [address] Phone: [phone]   Fax:  [phone]  Speech Language Pathology Treatment  Patient Details  Name: Christine Leblanc MRN: [id number] Date of Birth: October 05, 1929 No Data Recorded  Encounter Date: 11/14/2017  End of Session - 11/14/17 1936    Visit Number  3    Number of Visits  4    Authorization Type  Medicare    SLP Start Time  1430    SLP Stop Time   4174    SLP Time Calculation (min)  45 min    Activity Tolerance  Patient tolerated treatment well       Past Medical History:  Diagnosis Date  . Anemia   . Asthma   . Cancer ([place])    skin Ca- ? basal cell   . Chronic back pain   . Chronic neck pain   . Degenerative joint disease    Left shoulder; cervical spine, knees & hands   . Gastroesophageal reflux disease    Hiatal hernia; distal esophageal web requiring dilatation; gastric polyps; gastritis; refuses colonoscopy  . GERD (gastroesophageal reflux disease)   . History of stress test 1990's   stress test done under the care of Dr. Lattie Haw & Dr. Gwenlyn Found, now being followed by Dr. Harl Bowie- in [place] , recently seen & told to f/U in one yr.   . Hyperlipidemia 04/27/2011  . Hypertension   . Hypothyroidism   . Lymphocytic thyroiditis   . Multiple thyroid nodules 2010   Adenomatous; thyroidectomy in 2010  . Osteoarthritis of right knee 07/27/2014  . Pneumonia    hosp. for pneumonia- long time ago   . Primary localized osteoarthrosis of right shoulder 06/21/2015  . Seizures ([place])    yes- as a child- & into adult years, states she took med. for them at one time, stopped at 32 yrs. of age   . Syncope and collapse 2007   Possible CVA in 2012 with left lower extremity weakness; refused hospitalization; CT-Atrophy and chronic microvascular ischemic change.     Past Surgical History:  Procedure Laterality Date  . ABDOMINAL HYSTERECTOMY     fibroids  . CATARACT EXTRACTION      Bilateral; redo surgery on the right for incomplete primary procedure  . COLONOSCOPY  Remote  . ESOPHAGOGASTRODUODENOSCOPY  12/10   Dr. 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 ESOPHAGOGASTRODUODENOSCOPY N/A 03/21/2015   Procedure: ESOPHAGOGASTRODUODENOSCOPY (EGD);  Surgeon: Danie Binder, MD;  [facility];  Service: Endoscopy;  Laterality: N/A;  1130   . EYE SURGERY    . PROLAPSED UTERINE FIBROID LIGATION     outcomed with rectocele & cystocele  . SAVORY DILATION N/A 03/21/2015   Procedure: SAVORY DILATION;  Surgeon: Danie Binder, MD;  [facility];  Service: Endoscopy;  Laterality: N/A;  . SHOULDER SURGERY     left  . TOTAL KNEE ARTHROPLASTY Right 07/27/2014   Procedure: RIGHT TOTAL KNEE ARTHROPLASTY;  Surgeon: Johnny Bridge, MD;  Location: [facility];  Service: Orthopedics;  Laterality: Right;  . TOTAL SHOULDER ARTHROPLASTY Right 06/21/2015   Procedure: RIGHT TOTAL SHOULDER ARTHROPLASTY;  Surgeon: Marchia Bond, MD;  Location: [facility];  Service: Orthopedics;  Laterality: Right;  . TOTAL THYROIDECTOMY  2010    There were no vitals filed for this visit.  Subjective Assessment - 11/14/17 1934    Subjective  "I have been better about doing my  exercises now that they have been broken down."    Currently in Pain?  No/denies       ADULT SLP TREATMENT - 11/14/17 0001      General Information   Behavior/Cognition  Alert;Cooperative;Pleasant mood    Patient Positioning  Upright in chair    Oral care provided  N/A    HPI  [name] is an 83 yo woman who was referred for MBSS by Dr. Oneida Alar. She is known to this SLP from previous MBSS completed May 2016 and subsequent dysphagia therapy. Pt is a former smoker (quit 37 years ago), had total thyroidectomy in 2010, and had EGD with savory dilation 03/21/2015. She lives at home with her daughter (who has seizures and needs assist). She denies recent bouts of PNA, but is  followed by Dr. Harold Hedge for allergies and asthma and Dr. Benjamine Mola for hearing loss in right ear. Pt has history of GERD, hiatal hernia, distal esophageal web requiring dilation, gastric polyps, and gastritis. She is a former Therapist, sports.       Treatment Provided   Treatment provided  Dysphagia      Dysphagia Treatment   Temperature Spikes Noted  No    Respiratory Status  Room air    Oral Cavity - Dentition  Adequate natural dentition    Treatment Methods  Skilled observation;Compensation strategy training;Therapeutic exercise    Patient observed directly with PO's  Yes    Type of PO's observed  Thin liquids    Feeding  Able to feed self    Liquids provided via  Cup    Pharyngeal Phase Signs & Symptoms  Suspected delayed swallow initiation;Wet vocal quality    Type of cueing  Verbal    Amount of cueing  Minimal      Pain Assessment   Pain Assessment  No/denies pain      Progression Toward Goals   Progression toward goals  Goals met, education completed, patient discharged from [facility] - 11/14/17 1935    Education provided  Yes    Education Details  Continue completing pharyngeal exercises going forward; notify MD or SLP if change noted in swallow fxn    Person(s) Educated  Patient    Methods  Explanation;Handout    Comprehension  Verbalized understanding       SLP Short Term Goals - 11/14/17 1939      SLP SHORT TERM GOAL #1   Title  Pt will complete oropharyngeal exercises as assigned 3x/day with use of written cue.    Time  3    Period  Weeks    Status  Achieved      SLP SHORT TERM GOAL #2   Title  Pt will demonstrate safe and efficient consumption of D3/mech soft diet with thin liquids with use of compensatory strategies (bolus hold, head turn to LEFT with chin tuck) and min assist    Time  3    Period  Weeks    Status  Achieved       SLP Long Term Goals - 10/22/17 1834      SLP LONG TERM GOAL #1   Title  Same as short    Target Date  11/22/17        Plan - 11/14/17 1937    Clinical Impression Statement  Pt completed exercises at home per Pt report and felt that the exercises were more manageable for her to complete going forward. She completed 45  swallows in session with SLP this date with use of tokens in a cup for feedback. Pt feels that she is able to complete her exercises on her own and would like to be discharged from SLP at this time. SLP provided written follow up recommendations which include notifying MD if changes noted in swallow function.     Speech Therapy Frequency  1x /week    Duration  -- 3 weeks    Treatment/Interventions  Aspiration precaution training;Pharyngeal strengthening exercises;Diet toleration management by SLP;Compensatory techniques;Cueing hierarchy;SLP instruction and feedback;Compensatory strategies;Patient/family education    Potential to Achieve Goals  Fair    Potential Considerations  Severity of impairments;Ability to learn/carryover information;Previous level of function    SLP Home Exercise Plan  Pt will be independent with HEP as assigned to facilitate carryover of treatment strategies in home environment to decrease risk of aspiration PNA    Consulted and Agree with Plan of Care  Patient       Patient will benefit from skilled therapeutic intervention in order to improve the following deficits and impairments:   Dysphagia, oropharyngeal phase  G-Codes - 11/15/17 1940    Functional Assessment Tool Used  clinical judgment    Functional Limitations  Swallowing    Swallow Goal Status ([id number])  At least 20 percent but less than 40 percent impaired, limited or restricted    Swallow Discharge Status [phone])  At least 20 percent but less than 40 percent impaired, limited or restricted       Problem List Patient Active Problem List   Diagnosis Date Noted  . MGUS (monoclonal gammopathy of unknown significance) 10/21/2017  . Primary localized osteoarthrosis of right shoulder 06/21/2015  . S/P  shoulder replacement 06/21/2015  . Osteoarthritis of right knee 07/27/2014  . Postsurgical hypothyroidism 04/22/2014  . Syncope and collapse   . Anemia, normocytic normochromic 04/26/2012  . Gastroesophageal reflux disease   . Hypertension 12/07/2011  . History of TIAs 12/07/2011  . Leg pain, bilateral 04/27/2011  . Hyperlipidemia 04/27/2011  . Other dysphagia 06/07/2010   SPEECH THERAPY DISCHARGE SUMMARY  Visits from Start of Care: 3  Current functional level related to goals / functional outcomes: See above   Remaining deficits: Mild/mod oropharyngeal dysphagia   Education / Equipment: Completed  Plan: Patient agrees to discharge.  Patient goals were met. Patient is being discharged due to meeting the stated rehab goals.  ?????         Thank you,  Genene Churn, [place]  [facility] 2017-11-15, 7:40 PM  [facility] [address] [place], [place], [address] Phone: [phone]   Fax:  [phone]   Name: Christine Leblanc MRN: [id number] Date of Birth: 08/30/1929

## 2017-11-22 ENCOUNTER — Other Ambulatory Visit: Payer: Self-pay | Admitting: Cardiology

## 2017-11-27 DIAGNOSIS — J45909 Unspecified asthma, uncomplicated: Secondary | ICD-10-CM | POA: Diagnosis not present

## 2017-11-29 ENCOUNTER — Telehealth: Payer: Self-pay

## 2017-11-29 NOTE — Telephone Encounter (Signed)
Pt is requesting a refill on Omeprazole 20 mg be sent to [facility].

## 2017-12-03 ENCOUNTER — Other Ambulatory Visit: Payer: Self-pay

## 2017-12-03 NOTE — Telephone Encounter (Signed)
See separate note in refill box.

## 2017-12-04 ENCOUNTER — Encounter: Payer: Self-pay | Admitting: Gastroenterology

## 2017-12-05 MED ORDER — OMEPRAZOLE 20 MG PO CPDR: DELAYED_RELEASE_CAPSULE | ORAL | 5 refills | Status: DC

## 2017-12-12 ENCOUNTER — Telehealth: Payer: Self-pay | Admitting: Cardiology

## 2017-12-12 MED ORDER — PRAVASTATIN SODIUM 80 MG PO TABS: 80.0000 mg | ORAL_TABLET | Freq: Every evening | ORAL | 0 refills | Status: DC

## 2017-12-12 NOTE — Telephone Encounter (Addendum)
Appt made for 01/31/18 @ 1400 with Dr. Harl Bowie. Refill sent.

## 2017-12-12 NOTE — Telephone Encounter (Signed)
° ° ° °  1. Which medications need to be refilled? (please list name of each medication and dose if known)  pravastatin (PRAVACHOL) 80 MG tablet [[id number]]    2. Which pharmacy/location (including street and city if local pharmacy) is medication to be sent to? [facility]  3. Do they need a 30 day or 90 day supply?  90 day   Pt is not aware that she needs to be seen, will schedule w/ Dr. Harl Bowie with first available apt

## 2017-12-27 DIAGNOSIS — J45909 Unspecified asthma, uncomplicated: Secondary | ICD-10-CM | POA: Diagnosis not present

## 2017-12-30 ENCOUNTER — Other Ambulatory Visit (HOSPITAL_COMMUNITY): Payer: Self-pay | Admitting: Internal Medicine

## 2017-12-30 DIAGNOSIS — E2839 Other primary ovarian failure: Secondary | ICD-10-CM

## 2018-01-12 NOTE — Progress Notes (Deleted)
Christine Leblanc 84 y.o.    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   She previously had been on 100 mcg since 03/2014 This was subsequently reduced down to 75 g Her TSH was relatively low in 5/18 but she did not come back for follow-up in July  She continues to be having complaints of fatigue, this appears to be independent of her thyroid levels However she did feel better when her dose was increased in January when TSH was high  She has mild chronic cold intolerance  Compliance with the levothyroxine has been as prescribed with taking the tablet in the morning before breakfast.  Wt Readings from Last 3 Encounters:  10/21/17 109 lb (49.4 kg)  10/16/17 108 lb 6.4 oz (49.2 kg)  09/11/17 109 lb 3.2 oz (49.5 kg)   Lab Results  Component Value Date   TSH 1.150 09/12/2017   TSH 0.13 (L) 05/01/2017   TSH 6.020 (H) 01/24/2017   FREET4 1.78 (H) 09/12/2017   FREET4 1.75 (H) 05/01/2017   FREET4 1.66 01/24/2017       Allergies as of 01/13/2018      Reactions   Celecoxib Shortness Of Breath   Dexilant [dexlansoprazole] Anaphylaxis   abd pain      Medication List        Accurate as of 01/12/18  9:05 PM. Always use your most recent med list.          amLODipine 2.5 MG tablet Commonly known as:  NORVASC Take 2.5 mg by mouth daily.   aspirin 81 MG tablet Take 81 mg by mouth daily.   calcium-vitamin D 500-200 MG-UNIT tablet Commonly known as:  OSCAL WITH D Take 1 tablet by mouth 2 (two) times daily.   diazepam 5 MG tablet Commonly known as:  VALIUM Take  one tablet by mouth at bedtime as needed for sleep   DULCOLAX PO Take 1 tablet by mouth at bedtime.   GLUCOSAMINE PO Take 1 tablet by mouth 2 (two) times daily.   ipratropium 0.03 % nasal spray Commonly known as:  ATROVENT   levocetirizine 5 MG tablet Commonly known as:  XYZAL Take 5 mg by mouth every evening.   levothyroxine 75 MCG tablet Commonly known as:  SYNTHROID, LEVOTHROID TAKE 1 TABLET DAILY   montelukast 10 MG tablet Commonly known as:  SINGULAIR Take 10 mg by mouth at bedtime.   omeprazole 20 MG capsule Commonly known as:  PRILOSEC 1 PO 30 mins prior to breakfast and supper   pravastatin 80 MG tablet Commonly known as:  PRAVACHOL Take 1 tablet (80 mg total) by mouth every evening.   PROVENTIL HFA 108 (90 Base) MCG/ACT inhaler Generic drug:  albuterol Inhale 1-2 puffs into the lungs every 6 (six) hours as needed for wheezing or shortness of breath. Wheezing, Asthma Symptoms   pyridOXINE 100 MG tablet Commonly known as:  VITAMIN B-6 Take 100 mg by mouth at bedtime.   vitamin B-12 1000 MCG tablet Commonly known as:  CYANOCOBALAMIN Take 1,000 mcg by mouth every morning.   VITAMIN C PO Take 1  tablet by mouth daily.   vitamin E 400 UNIT capsule Take 400 Units by mouth at bedtime.       Allergies:  Allergies  Allergen Reactions  . Celecoxib Shortness Of Breath  . Dexilant [Dexlansoprazole] Anaphylaxis    abd pain    Past Medical History:  Diagnosis Date  . Anemia   . Asthma   . Cancer ([place])    skin Ca- ? basal cell   . Chronic back pain   . Chronic neck pain   . Degenerative joint disease    Left shoulder; cervical spine, knees & hands   . Gastroesophageal reflux disease    Hiatal hernia; distal esophageal web requiring dilatation; gastric polyps; gastritis; refuses colonoscopy  . GERD (gastroesophageal reflux disease)   . History of stress test 1990's   stress test done under the care of Dr. Lattie Haw & Dr. Gwenlyn Found, now being followed by Dr.  Harl Bowie- in [place] , recently seen & told to f/U in one yr.   . Hyperlipidemia 04/27/2011  . Hypertension   . Hypothyroidism   . Lymphocytic thyroiditis   . Multiple thyroid nodules 2010   Adenomatous; thyroidectomy in 2010  . Osteoarthritis of right knee 07/27/2014  . Pneumonia    hosp. for pneumonia- long time ago   . Primary localized osteoarthrosis of right shoulder 06/21/2015  . Seizures ([place])    yes- as a child- & into adult years, states she took med. for them at one time, stopped at 32 yrs. of age   . Syncope and collapse 2007   Possible CVA in 2012 with left lower extremity weakness; refused hospitalization; CT-Atrophy and chronic microvascular ischemic change.     Past Surgical History:  Procedure Laterality Date  . ABDOMINAL HYSTERECTOMY     fibroids  . CATARACT EXTRACTION     Bilateral; redo surgery on the right for incomplete primary procedure  . COLONOSCOPY  Remote  . ESOPHAGOGASTRODUODENOSCOPY  12/10   Dr. 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 ESOPHAGOGASTRODUODENOSCOPY N/A 03/21/2015   Procedure: ESOPHAGOGASTRODUODENOSCOPY (EGD);  Surgeon: Danie Binder, MD;  [facility];  Service: Endoscopy;  Laterality: N/A;  1130   . EYE SURGERY    . PROLAPSED UTERINE FIBROID LIGATION     outcomed with rectocele & cystocele  . SAVORY DILATION N/A 03/21/2015   Procedure: SAVORY DILATION;  Surgeon: Danie Binder, MD;  [facility];  Service: Endoscopy;  Laterality: N/A;  . SHOULDER SURGERY     left  . TOTAL KNEE ARTHROPLASTY Right 07/27/2014   Procedure: RIGHT TOTAL KNEE ARTHROPLASTY;  Surgeon: Johnny Bridge, MD;  Location: [facility];  Service: Orthopedics;  Laterality: Right;  . TOTAL SHOULDER ARTHROPLASTY Right 06/21/2015   Procedure: RIGHT TOTAL SHOULDER ARTHROPLASTY;  Surgeon: Marchia Bond, MD;  Location: [facility];  Service: Orthopedics;  Laterality: Right;  . TOTAL THYROIDECTOMY  2010    Family  History  Problem Relation Age of Onset  . Heart disease Mother   . Gallbladder disease Mother   . Heart failure Mother   . Breast cancer Sister        30 years ago  . Heart attack Father   . Colon cancer Neg Hx   . Stroke Neg Hx     Social History:  reports that she quit smoking about 39 years ago. Her smoking use included cigarettes. She has a 16.00 pack-year smoking history. she has never used smokeless tobacco. She reports  that she does not drink alcohol or use drugs.  REVIEW Of SYSTEMS:  Review of Systems  Gastrointestinal:       Dysphagia +   Weight History:  She says she has a slowly and is losing a little weight  Wt Readings from Last 3 Encounters:  10/21/17 109 lb (49.4 kg)  10/16/17 108 lb 6.4 oz (49.2 kg)  09/11/17 109 lb 3.2 oz (49.5 kg)    History of hypercholesterolemia followed by PCP   Not on B12, This had been recommended by PCP but she thinks she stopped it because her level was high   Examination:   There were no vitals taken for this visit.    Biceps reflexes normal No edema    Assessments   Hypothyroidism, postsurgical with previously stable supplement those those of levothyroxine She has had variable doses in the last year or so Since her TSH was low she is now taking only 75 g She is consistent with taking it daily in the morning  Her symptoms are quite nonspecific with continued fatigue, some weight loss and not clear what her thyroid levels are recently  Appears euthyroid today  Will check her labs which she will have drawn near her home and decide on further follow-up   Recommended that she go back to B12 supplements instead of the herbal energy supplement she was asking about which has caffeine also   There are no Patient Instructions on file for this visit.   Elayne Snare 01/12/2018, 9:05 PM

## 2018-01-13 ENCOUNTER — Ambulatory Visit: Payer: Medicare Other | Admitting: Endocrinology

## 2018-01-13 DIAGNOSIS — Z0289 Encounter for other administrative examinations: Secondary | ICD-10-CM

## 2018-01-15 DIAGNOSIS — J45909 Unspecified asthma, uncomplicated: Secondary | ICD-10-CM | POA: Diagnosis not present

## 2018-01-20 DIAGNOSIS — D225 Melanocytic nevi of trunk: Secondary | ICD-10-CM | POA: Diagnosis not present

## 2018-01-20 DIAGNOSIS — L821 Other seborrheic keratosis: Secondary | ICD-10-CM | POA: Diagnosis not present

## 2018-01-21 DIAGNOSIS — L97529 Non-pressure chronic ulcer of other part of left foot with unspecified severity: Secondary | ICD-10-CM | POA: Diagnosis not present

## 2018-01-21 DIAGNOSIS — M79671 Pain in right foot: Secondary | ICD-10-CM | POA: Diagnosis not present

## 2018-01-21 DIAGNOSIS — M2040 Other hammer toe(s) (acquired), unspecified foot: Secondary | ICD-10-CM | POA: Diagnosis not present

## 2018-01-21 DIAGNOSIS — M2010 Hallux valgus (acquired), unspecified foot: Secondary | ICD-10-CM | POA: Diagnosis not present

## 2018-01-22 DIAGNOSIS — Z803 Family history of malignant neoplasm of breast: Secondary | ICD-10-CM | POA: Diagnosis not present

## 2018-01-22 DIAGNOSIS — Z1231 Encounter for screening mammogram for malignant neoplasm of breast: Secondary | ICD-10-CM | POA: Diagnosis not present

## 2018-01-27 DIAGNOSIS — R0981 Nasal congestion: Secondary | ICD-10-CM | POA: Diagnosis not present

## 2018-01-27 DIAGNOSIS — R05 Cough: Secondary | ICD-10-CM | POA: Diagnosis not present

## 2018-01-27 DIAGNOSIS — J45909 Unspecified asthma, uncomplicated: Secondary | ICD-10-CM | POA: Diagnosis not present

## 2018-01-27 DIAGNOSIS — Z1389 Encounter for screening for other disorder: Secondary | ICD-10-CM | POA: Diagnosis not present

## 2018-01-27 DIAGNOSIS — Z6821 Body mass index (BMI) 21.0-21.9, adult: Secondary | ICD-10-CM | POA: Diagnosis not present

## 2018-01-27 DIAGNOSIS — R1319 Other dysphagia: Secondary | ICD-10-CM | POA: Diagnosis not present

## 2018-01-31 ENCOUNTER — Ambulatory Visit: Payer: Medicare Other | Admitting: Cardiology

## 2018-02-03 DIAGNOSIS — E039 Hypothyroidism, unspecified: Secondary | ICD-10-CM | POA: Diagnosis not present

## 2018-02-03 DIAGNOSIS — J302 Other seasonal allergic rhinitis: Secondary | ICD-10-CM | POA: Diagnosis not present

## 2018-02-03 DIAGNOSIS — E785 Hyperlipidemia, unspecified: Secondary | ICD-10-CM | POA: Diagnosis not present

## 2018-02-03 DIAGNOSIS — K219 Gastro-esophageal reflux disease without esophagitis: Secondary | ICD-10-CM | POA: Diagnosis not present

## 2018-02-03 DIAGNOSIS — I10 Essential (primary) hypertension: Secondary | ICD-10-CM | POA: Diagnosis not present

## 2018-02-06 ENCOUNTER — Encounter: Payer: Self-pay | Admitting: Gastroenterology

## 2018-02-06 ENCOUNTER — Ambulatory Visit (INDEPENDENT_AMBULATORY_CARE_PROVIDER_SITE_OTHER): Payer: Medicare Other | Admitting: Gastroenterology

## 2018-02-06 DIAGNOSIS — K219 Gastro-esophageal reflux disease without esophagitis: Secondary | ICD-10-CM | POA: Diagnosis not present

## 2018-02-06 DIAGNOSIS — R1319 Other dysphagia: Secondary | ICD-10-CM | POA: Diagnosis not present

## 2018-02-06 NOTE — Progress Notes (Signed)
cc'ed to pcp °

## 2018-02-06 NOTE — Assessment & Plan Note (Signed)
SYMPTOMS FAIRLY WELL CONTROLLED ON OMEPRAZOLE ONE QD OR BID.  CONTINUE OMEPRAZOLE(ONE OR TWO PILLS).  TAKE 30 MINUTES PRIOR TO YOUR FIRST MEAL. FOLLOW UP IN 6 MOS.

## 2018-02-06 NOTE — Patient Instructions (Addendum)
YOU GAINED 6-7 LBS FROM OCT 2018 TO FEB 0677(034 LBS TO 114.6 LBS). KEEP UP THE GOOD WORK!  CONTINUE OMEPRAZOLE(ONE OR TWO PILLS).  TAKE 30 MINUTES PRIOR TO YOUR FIRST MEAL.  FOLLOW UP IN 6 MOS.

## 2018-02-06 NOTE — Assessment & Plan Note (Signed)
SYMPTOMS FAIRLY WELL CONTROLLED. WEIGH UP 6-7 LBS.  CONTINUE TO MONITOR SYMPTOMS. FOLLOW UP IN 6 MOS.

## 2018-02-06 NOTE — Progress Notes (Signed)
Subjective:    Patient ID: Christine Leblanc, female    DOB: 12/14/1929, 84 y.o.   MRN: [id number]  Celene Squibb, MD   HPI Trying to keep weigh up. Eating things with with high amount of calories. Drinking shakes and veggies. BEEN COUGHING FOR A WEEK AND A HALF. WENT TO DOCTOR. CHEST SOUNDED OK. GOT A Z-PAK. NOW GOING TO SEE DR. HALL. NO TROUBLE WITH HEARTBURN. WANTS TO KNOW IF SHE CAN TAKE OMEPRAZOLE ONCE A DAY. A;WAYS HAS CONSTIPATION AND NEEDS SENOKOT TO HELP PASS STOOLS AND NO DIARRHEA/MORE NORMAL COLOR. RARELY USES NEBS OR NOSE SPRAY. ALLERGY SHOTS DOWN TO Q3 WEEKS AND NOW NOSE DRIPPING IS BETTER.  Problems swallowing: WHEN SHE EATS CERTAIN THINGS, HAS TO CHEW SO LONG, CUTS TWO PILLS IN HALF. DAUGHTER DOING PRETTY GOOD.  PT DENIES FEVER, CHILLS, HEMATOCHEZIA, HEMATEMESIS, nausea, vomiting, melena, diarrhea, CHEST PAIN, SHORTNESS OF BREATH, CHANGE IN BOWEL IN HABITS,  abdominal pain, OR heartburn or indigestion.  Past Medical History:  Diagnosis Date  . Anemia   . Asthma   . Cancer ([place])    skin Ca- ? basal cell   . Chronic back pain   . Chronic neck pain   . Degenerative joint disease    Left shoulder; cervical spine, knees & hands   . Gastroesophageal reflux disease    Hiatal hernia; distal esophageal web requiring dilatation; gastric polyps; gastritis; refuses colonoscopy  . GERD (gastroesophageal reflux disease)   . History of stress test 1990's   stress test done under the care of Dr. Lattie Haw & Dr. Gwenlyn Found, now being followed by Dr. Harl Bowie- in [place] , recently seen & told to f/U in one yr.   . Hyperlipidemia 04/27/2011  . Hypertension   . Hypothyroidism   . Lymphocytic thyroiditis   . Multiple thyroid nodules 2010   Adenomatous; thyroidectomy in 2010  . Osteoarthritis of right knee 07/27/2014  . Pneumonia    hosp. for pneumonia- long time ago   . Primary localized osteoarthrosis of right shoulder 06/21/2015  . Seizures ([place])    yes- as a child- & into adult years,  states she took med. for them at one time, stopped at 32 yrs. of age   . Syncope and collapse 2007   Possible CVA in 2012 with left lower extremity weakness; refused hospitalization; CT-Atrophy and chronic microvascular ischemic change.     Past Surgical History:  Procedure Laterality Date  . ABDOMINAL HYSTERECTOMY     fibroids  . CATARACT EXTRACTION     Bilateral; redo surgery on the right for incomplete primary procedure  . COLONOSCOPY  Remote  . ESOPHAGOGASTRODUODENOSCOPY  12/10   Dr. 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 ESOPHAGOGASTRODUODENOSCOPY N/A 03/21/2015   Procedure: ESOPHAGOGASTRODUODENOSCOPY (EGD);  Surgeon: Danie Binder, MD;  [facility];  Service: Endoscopy;  Laterality: N/A;  1130   . EYE SURGERY    . PROLAPSED UTERINE FIBROID LIGATION     outcomed with rectocele & cystocele  . SAVORY DILATION N/A 03/21/2015   Procedure: SAVORY DILATION;  Surgeon: Danie Binder, MD;  [facility];  Service: Endoscopy;  Laterality: N/A;  . SHOULDER SURGERY     left  . TOTAL KNEE ARTHROPLASTY Right 07/27/2014   Procedure: RIGHT TOTAL KNEE ARTHROPLASTY;  Surgeon: Johnny Bridge, MD;  Location: [facility];  Service: Orthopedics;  Laterality: Right;  . TOTAL SHOULDER ARTHROPLASTY Right 06/21/2015   Procedure: RIGHT TOTAL SHOULDER ARTHROPLASTY;  Surgeon: Marchia Bond, MD;  Location: [facility];  Service: Orthopedics;  Laterality: Right;  . TOTAL THYROIDECTOMY  2010    Allergies  Allergen Reactions  . Celecoxib Shortness Of Breath  . Dexilant [Dexlansoprazole] Anaphylaxis    abd pain   Current Outpatient Medications  Medication Sig Dispense Refill  . albuterol MCG/ACT inhaler Inhale 1-2 puffs Q6H PRN     . amLODipine 2.5 MG tablet Take 2.5 mg by mouth every other day.     . Ascorbic Acid (VITAMIN C PO) Take 1 tablet by mouth daily.    Marland Kitchen aspirin 81 MG tablet Take 81 mg by mouth daily.    . calcium-vitamin D  500-200 MG-UNIT per tablet Take 1 tablet by mouth daily.     Marland Kitchen VALIUM 5 MG tablet Take 2.5-5 mg QHS as needed (sleep).     . Glucosamine HCl  Take 1 tablet BID    . levocetirizine 5 MG tablet Take 5 mg by mouth every evening.      Marland Kitchen SYNTHROID 75 MCG tablet TAKE 1 TABLET DAILY    . SINGULAIR) 10 MG tablet Take 10 mg by mouth at bedtime.    Marland Kitchen omeprazole  20 MG capsule 1 PO QAC breakfast and supper    . PRAVACHOL 80 MG tablet DAILY    . pyridOXINE 100 MG tablet Take 100 mg by mouth at bedtime.    . SENOKOT 8.6 MG tablet Take 1 tablet by mouth daily.    . vitamin B-12 1000 MCG tablet Take 1,000 mcg every morning.    . vitamin E 400 UNIT capsule Take 400 Units by mouth at bedtime.     . Bisacodyl (DULCOLAX PO) Take 1 tablet by mouth at bedtime.    Marland Kitchen ipratropium 0.03 % nasal spray      Review of Systems PER HPI OTHERWISE ALL SYSTEMS ARE NEGATIVE.    Objective:   Physical Exam  Constitutional: She is oriented to person, place, and time. She appears well-developed and well-nourished. No distress.  HENT:  Head: Normocephalic and atraumatic.  Mouth/Throat: Oropharynx is clear and moist. No oropharyngeal exudate.  Eyes: Pupils are equal, round, and reactive to light. No scleral icterus.  Neck: Normal range of motion. Neck supple.  Cardiovascular: Normal rate, regular rhythm and normal heart sounds.  Pulmonary/Chest: Effort normal and breath sounds normal. No respiratory distress.  Abdominal: Soft. Bowel sounds are normal. She exhibits no distension. There is no tenderness.  Musculoskeletal: She exhibits deformity. She exhibits no edema.  Lymphadenopathy:    She has no cervical adenopathy.  Neurological: She is alert and oriented to person, place, and time.  NO  NEW FOCAL DEFICITS  Psychiatric: She has a normal mood and affect.  Vitals reviewed.     Assessment & Plan:

## 2018-02-06 NOTE — Progress Notes (Signed)
ON RECALL  °

## 2018-02-13 ENCOUNTER — Ambulatory Visit (INDEPENDENT_AMBULATORY_CARE_PROVIDER_SITE_OTHER): Payer: Medicare Other | Admitting: Cardiology

## 2018-02-13 ENCOUNTER — Encounter: Payer: Self-pay | Admitting: Cardiology

## 2018-02-13 VITALS — BP 112/60 | HR 80 | Ht 64.0 in | Wt 114.4 lb

## 2018-02-13 DIAGNOSIS — Z8673 Personal history of transient ischemic attack (TIA), and cerebral infarction without residual deficits: Secondary | ICD-10-CM

## 2018-02-13 DIAGNOSIS — E782 Mixed hyperlipidemia: Secondary | ICD-10-CM

## 2018-02-13 DIAGNOSIS — I10 Essential (primary) hypertension: Secondary | ICD-10-CM | POA: Diagnosis not present

## 2018-02-13 NOTE — Progress Notes (Signed)
Clinical Summary Christine Leblanc is a 84 y.o.female seen today for follow up of the following medical problems.   1. HTN - compliant with meds. Reports some SBPs in the 90s at times   2. Hyperlipidemia - 08/2016 TC 145 TG 48 HDL 58 LDL 77 - compliant with statin  3. Hx of TIA - on ASA and statin for secondary prevention - no recent symptoms  4. Hypothyroidism - followed by endocrine  5. Dysphagia - followed by GI.    Past Medical History:  Diagnosis Date  . Anemia   . Asthma   . Cancer ([place])    skin Ca- ? basal cell   . Chronic back pain   . Chronic neck pain   . Degenerative joint disease    Left shoulder; cervical spine, knees & hands   . Gastroesophageal reflux disease    Hiatal hernia; distal esophageal web requiring dilatation; gastric polyps; gastritis; refuses colonoscopy  . GERD (gastroesophageal reflux disease)   . History of stress test 1990's   stress test done under the care of Dr. Lattie Haw & Dr. Gwenlyn Found, now being followed by Dr. Harl Bowie- in [place] , recently seen & told to f/U in one yr.   . Hyperlipidemia 04/27/2011  . Hypertension   . Hypothyroidism   . Lymphocytic thyroiditis   . Multiple thyroid nodules 2010   Adenomatous; thyroidectomy in 2010  . Osteoarthritis of right knee 07/27/2014  . Pneumonia    hosp. for pneumonia- long time ago   . Primary localized osteoarthrosis of right shoulder 06/21/2015  . Seizures ([place])    yes- as a child- & into adult years, states she took med. for them at one time, stopped at 32 yrs. of age   . Syncope and collapse 2007   Possible CVA in 2012 with left lower extremity weakness; refused hospitalization; CT-Atrophy and chronic microvascular ischemic change.      Allergies  Allergen Reactions  . Celecoxib Shortness Of Breath  . Dexilant [Dexlansoprazole] Anaphylaxis    abd pain     Current Outpatient Medications  Medication Sig Dispense Refill  . albuterol (PROVENTIL HFA) 108 (90 BASE) MCG/ACT  inhaler Inhale 1-2 puffs into the lungs every 6 (six) hours as needed for wheezing or shortness of breath. Wheezing, Asthma Symptoms    . amLODipine (NORVASC) 2.5 MG tablet Take 2.5 mg by mouth every other day.     . Ascorbic Acid (VITAMIN C PO) Take 1 tablet by mouth daily.    Marland Kitchen aspirin 81 MG tablet Take 81 mg by mouth daily.    . Bisacodyl (DULCOLAX PO) Take 1 tablet by mouth at bedtime.    . calcium-vitamin D (OSCAL WITH D) 500-200 MG-UNIT per tablet Take 1 tablet by mouth daily.     . diazepam (VALIUM) 5 MG tablet Take one tablet by mouth at bedtime as needed for sleep (Patient taking differently: Take 2.5-5 mg by mouth at bedtime as needed (sleep). ) 30 tablet 5  . Glucosamine HCl (GLUCOSAMINE PO) Take 1 tablet by mouth 2 (two) times daily.    Marland Kitchen ipratropium (ATROVENT) 0.03 % nasal spray     . levocetirizine (XYZAL) 5 MG tablet Take 5 mg by mouth every evening.      Marland Kitchen levothyroxine (SYNTHROID, LEVOTHROID) 75 MCG tablet TAKE 1 TABLET DAILY 90 tablet 2  . montelukast (SINGULAIR) 10 MG tablet Take 10 mg by mouth at bedtime.    Marland Kitchen omeprazole (PRILOSEC) 20 MG capsule 1 PO 30 mins  prior to breakfast and supper 60 capsule 5  . pravastatin (PRAVACHOL) 80 MG tablet Take 1 tablet (80 mg total) by mouth every evening. 90 tablet 0  . pyridOXINE (VITAMIN B-6) 100 MG tablet Take 100 mg by mouth at bedtime.    . senna (SENOKOT) 8.6 MG tablet Take 1 tablet by mouth daily.    . vitamin B-12 (CYANOCOBALAMIN) 1000 MCG tablet Take 1,000 mcg by mouth every morning.    . vitamin E 400 UNIT capsule Take 400 Units by mouth at bedtime.      No current facility-administered medications for this visit.      Past Surgical History:  Procedure Laterality Date  . ABDOMINAL HYSTERECTOMY     fibroids  . CATARACT EXTRACTION     Bilateral; redo surgery on the right for incomplete primary procedure  . COLONOSCOPY  Remote  . ESOPHAGOGASTRODUODENOSCOPY  12/10   Dr. Oneida Alar: probable distal web s/p dilation small hiatal  hernia/gastric polyps/mild gastritis  . ESOPHAGOGASTRODUODENOSCOPY N/A 03/21/2015   Procedure: ESOPHAGOGASTRODUODENOSCOPY (EGD);  Surgeon: Danie Binder, MD;  [facility]: [facility];  Service: Endoscopy;  Laterality: N/A;  1130   . EYE SURGERY    . PROLAPSED UTERINE FIBROID LIGATION     outcomed with rectocele & cystocele  . SAVORY DILATION N/A 03/21/2015   Procedure: SAVORY DILATION;  Surgeon: Danie Binder, MD;  Location: [facility];  Service: Endoscopy;  Laterality: N/A;  . SHOULDER SURGERY     left  . TOTAL KNEE ARTHROPLASTY Right 07/27/2014   Procedure: RIGHT TOTAL KNEE ARTHROPLASTY;  Surgeon: Johnny Bridge, MD;  Location: [facility];  Service: Orthopedics;  Laterality: Right;  . TOTAL SHOULDER ARTHROPLASTY Right 06/21/2015   Procedure: RIGHT TOTAL SHOULDER ARTHROPLASTY;  Surgeon: Marchia Bond, MD;  Location: [facility];  Service: Orthopedics;  Laterality: Right;  . TOTAL THYROIDECTOMY  2010     Allergies  Allergen Reactions  . Celecoxib Shortness Of Breath  . Dexilant [Dexlansoprazole] Anaphylaxis    abd pain      Family History  Problem Relation Age of Onset  . Heart disease Mother   . Gallbladder disease Mother   . Heart failure Mother   . Breast cancer Sister        30 years ago  . Heart attack Father   . Colon cancer Neg Hx   . Stroke Neg Hx      Social History Christine Leblanc reports that she quit smoking about 39 years ago. Her smoking use included cigarettes. She has a 16.00 pack-year smoking history. she has never used smokeless tobacco. Christine Leblanc reports that she does not drink alcohol.   Review of Systems CONSTITUTIONAL: No weight loss, fever, chills, weakness or fatigue.  HEENT: Eyes: No visual loss, blurred vision, double vision or yellow sclerae.No hearing loss, sneezing, congestion, runny nose or sore throat.  SKIN: No rash or itching.  CARDIOVASCULAR: no chest pain, no palpitations.  RESPIRATORY: No shortness of breath, cough or sputum.    GASTROINTESTINAL: No anorexia, nausea, vomiting or diarrhea. No abdominal pain or blood.  GENITOURINARY: No burning on urination, no polyuria NEUROLOGICAL: No headache, dizziness, syncope, paralysis, ataxia, numbness or tingling in the extremities. No change in bowel or bladder control.  MUSCULOSKELETAL: No muscle, back pain, joint pain or stiffness.  LYMPHATICS: No enlarged nodes. No history of splenectomy.  PSYCHIATRIC: No history of depression or anxiety.  ENDOCRINOLOGIC: No reports of sweating, cold or heat intolerance. No polyuria or polydipsia.  Marland Kitchen   Physical  Examination Vitals:   02/13/18 1359  BP: 112/60  Pulse: 80  SpO2: 97%   Vitals:   02/13/18 1359  Weight: 114 lb 6.4 oz (51.9 kg)  Height: 5\' 4"  (1.626 m)    Gen: resting comfortably, no acute distress HEENT: no scleral icterus, pupils equal round and reactive, no palptable cervical adenopathy,  CV: RRR, no m/r/g, no jvd Resp: Clear to auscultation bilaterally GI: abdomen is soft, non-tender, non-distended, normal bowel sounds, no hepatosplenomegaly MSK: extremities are warm, no edema.  Skin: warm, no rash Neuro:  no focal deficits Psych: appropriate affect   Assessment and Plan   1. HTN - at goal in clinic, some occasional low bp's at home - contiue to monitor at home, asked to keep bp log. Continue current meds  2. Hyperlipideima - continue statin, request pcp labs.   3. Hx of TIA -continue current meds       Arnoldo Lenis, M.D.

## 2018-02-13 NOTE — Patient Instructions (Signed)
Medication Instructions:  Your physician recommends that you continue on your current medications as directed. Please refer to the Current Medication list given to you today.  HOLD Norvasc   Labwork: NONE   Testing/Procedures: NONE   Follow-Up: Your physician recommends that you schedule a follow-up appointment As Needed.    Any Other Special Instructions Will Be Listed Below (If Applicable).  Your physician has requested that you regularly monitor and record your blood pressure readings at home. Please use the same machine at the same time of day to check your readings and record them to bring to your follow-up visit with Dr. Nevada Crane.     If you need a refill on your cardiac medications before your next appointment, please call your pharmacy. Thank you for choosing [facility]!

## 2018-02-14 ENCOUNTER — Encounter: Payer: Self-pay | Admitting: *Deleted

## 2018-02-17 DIAGNOSIS — J302 Other seasonal allergic rhinitis: Secondary | ICD-10-CM | POA: Diagnosis not present

## 2018-02-18 ENCOUNTER — Encounter: Payer: Self-pay | Admitting: Cardiology

## 2018-02-27 ENCOUNTER — Telehealth: Payer: Self-pay | Admitting: Cardiology

## 2018-02-27 NOTE — Telephone Encounter (Signed)
Pt calling about BP readings, please give her a call

## 2018-02-27 NOTE — Telephone Encounter (Signed)
[id number] 

## 2018-03-03 MED ORDER — PRAVASTATIN SODIUM 80 MG PO TABS: 80.0000 mg | ORAL_TABLET | Freq: Every evening | ORAL | 3 refills | Status: DC

## 2018-03-03 NOTE — Telephone Encounter (Signed)
Bp's overall look good, no changes   J Goldie Dimmer MD

## 2018-03-03 NOTE — Telephone Encounter (Signed)
Patient notified of Dr.Branch's message and pt agrees

## 2018-03-03 NOTE — Telephone Encounter (Signed)
124/67, 141/66, 136/65, 121/66, 142/74, 125/65, 140/70,HR 56-83     I will send to Dr Harl Bowie

## 2018-03-11 DIAGNOSIS — I10 Essential (primary) hypertension: Secondary | ICD-10-CM | POA: Diagnosis not present

## 2018-03-11 DIAGNOSIS — E039 Hypothyroidism, unspecified: Secondary | ICD-10-CM | POA: Diagnosis not present

## 2018-03-11 DIAGNOSIS — E785 Hyperlipidemia, unspecified: Secondary | ICD-10-CM | POA: Diagnosis not present

## 2018-03-11 DIAGNOSIS — K219 Gastro-esophageal reflux disease without esophagitis: Secondary | ICD-10-CM | POA: Diagnosis not present

## 2018-03-11 DIAGNOSIS — J302 Other seasonal allergic rhinitis: Secondary | ICD-10-CM | POA: Diagnosis not present

## 2018-03-22 ENCOUNTER — Other Ambulatory Visit: Payer: Self-pay | Admitting: Endocrinology

## 2018-03-31 DIAGNOSIS — J302 Other seasonal allergic rhinitis: Secondary | ICD-10-CM | POA: Diagnosis not present

## 2018-03-31 DIAGNOSIS — I10 Essential (primary) hypertension: Secondary | ICD-10-CM | POA: Diagnosis not present

## 2018-03-31 DIAGNOSIS — K219 Gastro-esophageal reflux disease without esophagitis: Secondary | ICD-10-CM | POA: Diagnosis not present

## 2018-03-31 DIAGNOSIS — E785 Hyperlipidemia, unspecified: Secondary | ICD-10-CM | POA: Diagnosis not present

## 2018-03-31 DIAGNOSIS — E039 Hypothyroidism, unspecified: Secondary | ICD-10-CM | POA: Diagnosis not present

## 2018-04-14 ENCOUNTER — Other Ambulatory Visit (HOSPITAL_COMMUNITY): Payer: Self-pay | Admitting: *Deleted

## 2018-04-14 DIAGNOSIS — D472 Monoclonal gammopathy: Secondary | ICD-10-CM

## 2018-04-15 ENCOUNTER — Inpatient Hospital Stay (HOSPITAL_COMMUNITY): Payer: Medicare Other | Attending: Hematology

## 2018-04-15 DIAGNOSIS — I803 Phlebitis and thrombophlebitis of lower extremities, unspecified: Secondary | ICD-10-CM | POA: Diagnosis not present

## 2018-04-15 DIAGNOSIS — E039 Hypothyroidism, unspecified: Secondary | ICD-10-CM | POA: Insufficient documentation

## 2018-04-15 DIAGNOSIS — D472 Monoclonal gammopathy: Secondary | ICD-10-CM | POA: Diagnosis not present

## 2018-04-15 DIAGNOSIS — I10 Essential (primary) hypertension: Secondary | ICD-10-CM | POA: Diagnosis not present

## 2018-04-15 LAB — COMPREHENSIVE METABOLIC PANEL
ALBUMIN: 3.6 g/dL (ref 3.5–5.0)
ALT: 19 U/L (ref 14–54)
ANION GAP: 10 (ref 5–15)
AST: 31 U/L (ref 15–41)
Alkaline Phosphatase: 61 U/L (ref 38–126)
BILIRUBIN TOTAL: 0.7 mg/dL (ref 0.3–1.2)
BUN: 22 mg/dL — ABNORMAL HIGH (ref 6–20)
CO2: 27 mmol/L (ref 22–32)
Calcium: 9 mg/dL (ref 8.9–10.3)
Chloride: 105 mmol/L (ref 101–111)
Creatinine, Ser: 0.87 mg/dL (ref 0.44–1.00)
GFR calc Af Amer: >60 mL/min (ref >60)
GFR calc non Af Amer: 59 mL/min — ABNORMAL LOW (ref >60)
GLUCOSE: 98 mg/dL (ref 65–99)
POTASSIUM: 3.4 mmol/L — AB (ref 3.5–5.1)
Sodium: 142 mmol/L (ref 135–145)
TOTAL PROTEIN: 7.1 g/dL (ref 6.5–8.1)

## 2018-04-15 LAB — CBC WITH DIFFERENTIAL/PLATELET
BASOS PCT: 1 %
Basophils Absolute: 0.1 10*3/uL (ref 0.0–0.1)
Eosinophils Absolute: 0.7 10*3/uL (ref 0.0–0.7)
Eosinophils Relative: 11 %
HCT: 33.9 % — ABNORMAL LOW (ref 36.0–46.0)
Hemoglobin: 10.9 g/dL — ABNORMAL LOW (ref 12.0–15.0)
Lymphocytes Relative: 22 %
Lymphs Abs: 1.4 10*3/uL (ref 0.7–4.0)
MCH: 29.2 pg (ref 26.0–34.0)
MCHC: 32.2 g/dL (ref 30.0–36.0)
MCV: 90.9 fL (ref 78.0–100.0)
MONOS PCT: 10 %
Monocytes Absolute: 0.7 10*3/uL (ref 0.1–1.0)
Neutro Abs: 3.6 10*3/uL (ref 1.7–7.7)
Neutrophils Relative %: 56 %
Platelets: 178 10*3/uL (ref 150–400)
RBC: 3.73 MIL/uL — ABNORMAL LOW (ref 3.87–5.11)
RDW: 14.6 % (ref 11.5–15.5)
WBC: 6.4 10*3/uL (ref 4.0–10.5)

## 2018-04-16 DIAGNOSIS — M25561 Pain in right knee: Secondary | ICD-10-CM | POA: Diagnosis not present

## 2018-04-16 LAB — KAPPA/LAMBDA LIGHT CHAINS
KAPPA FREE LGHT CHN: 30.9 mg/L — AB (ref 3.3–19.4)
Kappa, lambda light chain ratio: 1.68 — ABNORMAL HIGH (ref 0.26–1.65)
Lambda free light chains: 18.4 mg/L (ref 5.7–26.3)

## 2018-04-16 LAB — BETA 2 MICROGLOBULIN, SERUM: Beta-2 Microglobulin: 3 mg/L — ABNORMAL HIGH (ref 0.6–2.4)

## 2018-04-17 LAB — MULTIPLE MYELOMA PANEL, SERUM
ALBUMIN/GLOB SERPL: 1 (ref 0.7–1.7)
ALPHA2 GLOB SERPL ELPH-MCNC: 0.9 g/dL (ref 0.4–1.0)
Albumin SerPl Elph-Mcnc: 3.3 g/dL (ref 2.9–4.4)
Alpha 1: 0.2 g/dL (ref 0.0–0.4)
B-Globulin SerPl Elph-Mcnc: 1 g/dL (ref 0.7–1.3)
Gamma Glob SerPl Elph-Mcnc: 1.2 g/dL (ref 0.4–1.8)
Globulin, Total: 3.4 g/dL (ref 2.2–3.9)
IGA: 321 mg/dL (ref 64–422)
IGM (IMMUNOGLOBULIN M), SRM: 131 mg/dL (ref 26–217)
IgG (Immunoglobin G), Serum: 1041 mg/dL (ref 700–1600)
Total Protein ELP: 6.7 g/dL (ref 6.0–8.5)

## 2018-04-18 DIAGNOSIS — S90415A Abrasion, left lesser toe(s), initial encounter: Secondary | ICD-10-CM | POA: Diagnosis not present

## 2018-04-19 ENCOUNTER — Other Ambulatory Visit: Payer: Self-pay | Admitting: Endocrinology

## 2018-04-22 ENCOUNTER — Inpatient Hospital Stay (HOSPITAL_BASED_OUTPATIENT_CLINIC_OR_DEPARTMENT_OTHER): Payer: Medicare Other | Admitting: Internal Medicine

## 2018-04-22 ENCOUNTER — Other Ambulatory Visit: Payer: Self-pay

## 2018-04-22 VITALS — BP 136/65 | HR 87 | Temp 98.3°F | Resp 18 | Wt 113.8 lb

## 2018-04-22 DIAGNOSIS — D472 Monoclonal gammopathy: Secondary | ICD-10-CM

## 2018-04-22 DIAGNOSIS — I803 Phlebitis and thrombophlebitis of lower extremities, unspecified: Secondary | ICD-10-CM

## 2018-04-22 DIAGNOSIS — I10 Essential (primary) hypertension: Secondary | ICD-10-CM | POA: Diagnosis not present

## 2018-04-22 DIAGNOSIS — E039 Hypothyroidism, unspecified: Secondary | ICD-10-CM

## 2018-04-22 NOTE — Patient Instructions (Addendum)
[place] at [facility]  Discharge Instructions:  You were seen by Dr. Walden Field today.   Return in 1 year with labs _______________________________________________________________  Thank you for choosing [place] at [facility] to provide your oncology and hematology care.  To afford each patient quality time with our providers, please arrive at least 15 minutes before your scheduled appointment.  You need to re-schedule your appointment if you arrive 10 or more minutes late.  We strive to give you quality time with our providers, and arriving late affects you and other patients whose appointments are after yours.  Also, if you no show three or more times for appointments you may be dismissed from the clinic.  Again, thank you for choosing [place] [facility] [place] hope is that these requests will allow you access to exceptional care and in a timely manner. _______________________________________________________________  If you have questions after your visit, please contact our office at [phone] between the hours of 8:30 a.m. and 5:00 p.m. Voicemails left after 4:30 p.m. will not be returned until the following business day. _______________________________________________________________  For prescription refill requests, have your pharmacy contact our office. _______________________________________________________________  Recommendations made by the consultant and any test results will be sent to your referring physician. _______________________________________________________________

## 2018-04-22 NOTE — Progress Notes (Signed)
Diagnosis MGUS (monoclonal gammopathy of unknown significance) - Plan: CBC with Differential/Platelet, Comprehensive metabolic panel, Lactate dehydrogenase, Protein electrophoresis, serum, Kappa/lambda light chains, IgG, IgA, IgM  Staging Cancer Staging No matching staging information was found for the patient.  Assessment and Plan:  1. Monoclonal gammopathy of unknown significance: IgG monoclonal protein with kappa light chain specificity on immunofixation. SPEP demonstrated 0.2 g/dL spike on labs done June 2018..  I discussed with the patient SPEP done October 2018 showed no M spike.  Labs done 04/15/2018 show a white count 6.4 hemoglobin 10.9 platelets 178,000, creatinine is 0.87, calcium is 9, SPEP shows no monoclonal protein.  I discussed with the patient she has had 2 negative SPEP evaluations.  It would be reasonable to have her return to clinic in 1 year for repeat lab evaluation.  She shows no evidence of hypercalcemia or renal insufficiency.    2.  Superficial thrombophlebitis.  Patient has what appears to be a small varicosity on the right leg.  There is not tender.  She reports she is on an aspirin.  She should follow with her primary care physician if ongoing issues.  3.  Hypertension.  Blood pressure is 136/65.  Continue to follow with PCP.  4.  Hypothyroidism.  Patient is on Synthroid.  Follow-up with PCP for monitoring.  5.  Joint pain.  She has a diagnosis of osteoarthritis.  She has undergone knee replacements in the past.  Interval History:  84 y.o. Female previously followed by Dr. Talbert Cage for monoclonal gammopathy. Patient was seen her neurologist who checked a myeloma panel which demonstrated that patient had IgG monoclonal protein with kappa light chain specificity on immunofixation. SPEP demonstrated 0.2 g/dL spike. CMP from 05/21/17 demonstrated creatinine 0.76, calcium 9.2. Pt has remained on observation.    Current Status: Patient is seen today for follow-up.  She is here  today to go over labs.  She is reporting an area on her right leg that had previously been painful.  Problem List Patient Active Problem List   Diagnosis Date Noted  . MGUS (monoclonal gammopathy of unknown significance) [D47.2] 10/21/2017  . Primary localized osteoarthrosis of right shoulder [M19.011] 06/21/2015  . S/P shoulder replacement [Z96.619] 06/21/2015  . Osteoarthritis of right knee [M17.11] 07/27/2014  . 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 Syncope and collapse [R55]   . Anemia, normocytic normochromic [D64.9] 04/26/2012  . Gastroesophageal reflux disease [K21.9]   . Hypertension [I10] 12/07/2011  . History of TIAs [Z86.73] 12/07/2011  . Leg pain, bilateral [Y86.578, M79.605] 04/27/2011  . Hyperlipidemia [E78.5] 04/27/2011  . Other dysphagia [R13.19] 06/07/2010    Past Medical History Past Medical History:  Diagnosis Date  . Anemia   . Asthma   . Cancer ([place])    skin Ca- ? basal cell   . Chronic back pain   . Chronic neck pain   . Degenerative joint disease    Left shoulder; cervical spine, knees & hands   . Gastroesophageal reflux disease    Hiatal hernia; distal esophageal web requiring dilatation; gastric polyps; gastritis; refuses colonoscopy  . GERD (gastroesophageal reflux disease)   . History of stress test 1990's   stress test done under the care of Dr. Lattie Haw & Dr. Gwenlyn Found, now being followed by Dr. Harl Bowie- in [place] , recently seen & told to f/U in one yr.   . Hyperlipidemia 04/27/2011  . Hypertension   . Hypothyroidism   . Lymphocytic thyroiditis   . Multiple thyroid nodules 2010   Adenomatous; thyroidectomy  in 2010  . Osteoarthritis of right knee 07/27/2014  . Pneumonia    hosp. for pneumonia- long time ago   . Primary localized osteoarthrosis of right shoulder 06/21/2015  . Seizures ([place])    yes- as a child- & into adult years, states she took med. for them at one time, stopped at 32 yrs. of age   . Syncope and collapse 2007    Possible CVA in 2012 with left lower extremity weakness; refused hospitalization; CT-Atrophy and chronic microvascular ischemic change.     Past Surgical History Past Surgical History:  Procedure Laterality Date  . ABDOMINAL HYSTERECTOMY     fibroids  . CATARACT EXTRACTION     Bilateral; redo surgery on the right for incomplete primary procedure  . COLONOSCOPY  Remote  . ESOPHAGOGASTRODUODENOSCOPY  12/10   Dr. 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 ESOPHAGOGASTRODUODENOSCOPY N/A 03/21/2015   Procedure: ESOPHAGOGASTRODUODENOSCOPY (EGD);  Surgeon: Danie Binder, MD;  [facility];  Service: Endoscopy;  Laterality: N/A;  1130   . EYE SURGERY    . PROLAPSED UTERINE FIBROID LIGATION     outcomed with rectocele & cystocele  . SAVORY DILATION N/A 03/21/2015   Procedure: SAVORY DILATION;  Surgeon: Danie Binder, MD;  [facility];  Service: Endoscopy;  Laterality: N/A;  . SHOULDER SURGERY     left  . TOTAL KNEE ARTHROPLASTY Right 07/27/2014   Procedure: RIGHT TOTAL KNEE ARTHROPLASTY;  Surgeon: Johnny Bridge, MD;  Location: [facility];  Service: Orthopedics;  Laterality: Right;  . TOTAL SHOULDER ARTHROPLASTY Right 06/21/2015   Procedure: RIGHT TOTAL SHOULDER ARTHROPLASTY;  Surgeon: Marchia Bond, MD;  Location: [facility];  Service: Orthopedics;  Laterality: Right;  . TOTAL THYROIDECTOMY  2010    Family History Family History  Problem Relation Age of Onset  . Heart disease Mother   . Gallbladder disease Mother   . Heart failure Mother   . Breast cancer Sister        30 years ago  . Heart attack Father   . Colon cancer Neg Hx   . Stroke Neg Hx      Social History  reports that she quit smoking about 39 years ago. Her smoking use included cigarettes. She has a 16.00 pack-year smoking history. She has never used smokeless tobacco. She reports that she does not drink alcohol or use drugs.  Medications  Current  Outpatient Medications:  .  albuterol (PROVENTIL HFA) 108 (90 BASE) MCG/ACT inhaler, Inhale 1-2 puffs into the lungs every 6 (six) hours as needed for wheezing or shortness of breath. Wheezing, Asthma Symptoms, Disp: , Rfl:  .  Ascorbic Acid (VITAMIN C PO), Take 1 tablet by mouth daily., Disp: , Rfl:  .  aspirin 81 MG tablet, Take 81 mg by mouth daily., Disp: , Rfl:  .  Bisacodyl (DULCOLAX PO), Take 1 tablet by mouth at bedtime., Disp: , Rfl:  .  calcium-vitamin D (OSCAL WITH D) 500-200 MG-UNIT per tablet, Take 1 tablet by mouth daily. , Disp: , Rfl:  .  diazepam (VALIUM) 5 MG tablet, Take one tablet by mouth at bedtime as needed for sleep (Patient taking differently: Take 2.5-5 mg by mouth at bedtime as needed (sleep). ), Disp: 30 tablet, Rfl: 5 .  Glucosamine HCl (GLUCOSAMINE PO), Take 1 tablet by mouth 2 (two) times daily., Disp: , Rfl:  .  ipratropium (ATROVENT) 0.03 % nasal spray, , Disp: , Rfl:  .  levocetirizine (XYZAL) 5 MG tablet, Take 5 mg by mouth every evening.  , Disp: , Rfl:  .  levothyroxine (SYNTHROID, LEVOTHROID) 75 MCG tablet, TAKE 1 TABLET BY MOUTH ONCE A DAY., Disp: 30 tablet, Rfl: 0 .  montelukast (SINGULAIR) 10 MG tablet, Take 10 mg by mouth at bedtime., Disp: , Rfl:  .  omeprazole (PRILOSEC) 20 MG capsule, 1 PO 30 mins prior to breakfast and supper, Disp: 60 capsule, Rfl: 5 .  pravastatin (PRAVACHOL) 80 MG tablet, Take 1 tablet (80 mg total) by mouth every evening., Disp: 90 tablet, Rfl: 3 .  pyridOXINE (VITAMIN B-6) 100 MG tablet, Take 100 mg by mouth at bedtime., Disp: , Rfl:  .  senna (SENOKOT) 8.6 MG tablet, Take 1 tablet by mouth daily., Disp: , Rfl:  .  vitamin B-12 (CYANOCOBALAMIN) 1000 MCG tablet, Take 1,000 mcg by mouth every morning., Disp: , Rfl:  .  vitamin E 400 UNIT capsule, Take 400 Units by mouth at bedtime. , Disp: , Rfl:   Allergies Celecoxib and Dexilant [dexlansoprazole]  Review of Systems Review of Systems - Oncology ROS as per HPI otherwise 12  point ROS is negative.   Physical Exam  Vitals Wt Readings from Last 3 Encounters:  04/22/18 113 lb 12.8 oz (51.6 kg)  02/13/18 114 lb 6.4 oz (51.9 kg)  02/06/18 114 lb 9.6 oz (52 kg)   Temp Readings from Last 3 Encounters:  04/22/18 98.3 F (36.8 C) (Oral)  02/06/18 (!) 97.3 F (36.3 C) (Oral)  10/16/17 (!) 97.4 F (36.3 C) (Oral)   BP Readings from Last 3 Encounters:  04/22/18 136/65  02/13/18 112/60  02/06/18 114/64   Pulse Readings from Last 3 Encounters:  04/22/18 87  02/13/18 80  02/06/18 85    Constitutional: Well-developed, well-nourished, and in no distress.   HENT: Head: Normocephalic and atraumatic.  Mouth/Throat: No oropharyngeal exudate. Mucosa moist. Eyes: Pupils are equal, round, and reactive to light. Conjunctivae are normal. No scleral icterus.  Neck: Normal range of motion. Neck supple. No JVD present.  Cardiovascular: Normal rate, regular rhythm and normal heart sounds.  Exam reveals no gallop and no friction rub.   No murmur heard. Pulmonary/Chest: Effort normal and breath sounds normal. No respiratory distress. No wheezes.No rales.  Abdominal: Soft. Bowel sounds are normal. No distension. There is no tenderness. There is no guarding.  Musculoskeletal: No edema or tenderness.  Superficial varicosity noted on the right, no tenderness. Lymphadenopathy: No cervical, axillary or supraclavicular adenopathy.  Neurological: Alert and oriented to person, place, and time. No cranial nerve deficit.  Skin: Skin is warm and dry. No rash noted. No erythema. No pallor.  Psychiatric: Affect and judgment normal.   Labs No visits with results within 3 Day(s) from this visit.  Latest known visit with results is:  Appointment on 04/15/2018  Component Date Value Ref Range Status  . WBC 04/15/2018 6.4  4.0 - 10.5 K/uL Final  . RBC 04/15/2018 3.73* 3.87 - 5.11 MIL/uL Final  . Hemoglobin 04/15/2018 10.9* 12.0 - 15.0 g/dL Final  . HCT 04/15/2018 33.9* 36.0 - 46.0 %  Final  . MCV 04/15/2018 90.9  78.0 - 100.0 fL Final  . MCH 04/15/2018 29.2  26.0 - 34.0 pg Final  . MCHC 04/15/2018 32.2  30.0 - 36.0 g/dL Final  . RDW 04/15/2018 14.6  11.5 - 15.5 % Final  . Platelets 04/15/2018 178  150 - 400 K/uL Final  . Neutrophils Relative % 04/15/2018 56  % Final  .  Neutro Abs 04/15/2018 3.6  1.7 - 7.7 K/uL Final  . Lymphocytes Relative 04/15/2018 22  % Final  . Lymphs Abs 04/15/2018 1.4  0.7 - 4.0 K/uL Final  . Monocytes Relative 04/15/2018 10  % Final  . Monocytes Absolute 04/15/2018 0.7  0.1 - 1.0 K/uL Final  . Eosinophils Relative 04/15/2018 11  % Final  . Eosinophils Absolute 04/15/2018 0.7  0.0 - 0.7 K/uL Final  . Basophils Relative 04/15/2018 1  % Final  . Basophils Absolute 04/15/2018 0.1  0.0 - 0.1 K/uL Final   Performed at [facility], [address], [place], [place] [address]  . Sodium 04/15/2018 142  135 - 145 mmol/L Final  . Potassium 04/15/2018 3.4* 3.5 - 5.1 mmol/L Final  . Chloride 04/15/2018 105  101 - 111 mmol/L Final  . CO2 04/15/2018 27  22 - 32 mmol/L Final  . Glucose, Bld 04/15/2018 98  65 - 99 mg/dL Final  . BUN 04/15/2018 22* 6 - 20 mg/dL Final  . Creatinine, Ser 04/15/2018 0.87  0.44 - 1.00 mg/dL Final  . Calcium 04/15/2018 9.0  8.9 - 10.3 mg/dL Final  . Total Protein 04/15/2018 7.1  6.5 - 8.1 g/dL Final  . Albumin 04/15/2018 3.6  3.5 - 5.0 g/dL Final  . AST 04/15/2018 31  15 - 41 U/L Final  . ALT 04/15/2018 19  14 - 54 U/L Final  . Alkaline Phosphatase 04/15/2018 61  38 - 126 U/L Final  . Total Bilirubin 04/15/2018 0.7  0.3 - 1.2 mg/dL Final  . GFR calc non Af Amer 04/15/2018 59* >60 mL/min Final  . GFR calc Af Amer 04/15/2018 >60  >60 mL/min Final   Comment: (NOTE) The eGFR has been calculated using the CKD EPI equation. This calculation has not been validated in all clinical situations. eGFR's persistently <60 mL/min signify possible Chronic Kidney Disease.   Georgiann Hahn gap 04/15/2018 10  5 - 15 Final   Performed at [facility], [address], [place], [place] [address]  . Kappa free light chain 04/15/2018 30.9* 3.3 - 19.4 mg/L Final  . Lamda free light chains 04/15/2018 18.4  5.7 - 26.3 mg/L Final  . Kappa, lamda light chain ratio 04/15/2018 1.68* 0.26 - 1.65 Final   Comment: (NOTE) Performed At: [facility] [place], [place] [id number] Rush Farmer MD [id number] Performed at [facility], [address], [place], [place] [address]   . Beta-2 Microglobulin 04/15/2018 3.0* 0.6 - 2.4 mg/L Final   Comment: (NOTE) Siemens Immulite 2000 Immunochemiluminometric assay (ICMA) Values obtained with different assay methods or kits cannot be used interchangeably. Results cannot be interpreted as absolute evidence of the presence or absence of malignant disease. Performed At: [facility] [place], [place] [id number] Rush Farmer MD [id number] Performed at [facility], [address], [place], [place] [address]   . IgG (Immunoglobin G), Serum 04/15/2018 1,041  700 - 1,600 mg/dL Final  . IgA 04/15/2018 321  64 - 422 mg/dL Final  . IgM (Immunoglobulin M), Srm 04/15/2018 131  26 - 217 mg/dL Final  . Total Protein ELP 04/15/2018 6.7  6.0 - 8.5 g/dL Corrected  . Albumin SerPl Elph-Mcnc 04/15/2018 3.3  2.9 - 4.4 g/dL Corrected  . Alpha 1 04/15/2018 0.2  0.0 - 0.4 g/dL Corrected  . Alpha2 Glob SerPl Elph-Mcnc 04/15/2018 0.9  0.4 - 1.0 g/dL Corrected  . B-Globulin SerPl Elph-Mcnc 04/15/2018 1.0  0.7 - 1.3 g/dL Corrected  . Gamma  Glob SerPl Elph-Mcnc 04/15/2018 1.2  0.4 - 1.8 g/dL Corrected  . M Protein SerPl Elph-Mcnc 04/15/2018 Not Observed  Not Observed g/dL Corrected  . Globulin, Total 04/15/2018 3.4  2.2 - 3.9 g/dL Corrected  . Albumin/Glob SerPl 04/15/2018 1.0  0.7 - 1.7 Corrected  . IFE 1 04/15/2018 Comment   Corrected   An apparent normal immunofixation pattern.  . Please Note 04/15/2018 Comment   Corrected   Comment: (NOTE) Protein electrophoresis scan will  follow via computer, mail, or courier delivery. Performed At: [facility] [place], [place] [id number] Rush Farmer MD [id number] Performed at [facility], [address], [place], [place] [address]      Pathology Orders Placed This Encounter  Procedures  . CBC with Differential/Platelet    Standing Status:   Future    Standing Expiration Date:   04/22/2020  . Comprehensive metabolic panel    Standing Status:   Future    Standing Expiration Date:   04/22/2020  . Lactate dehydrogenase    Standing Status:   Future    Standing Expiration Date:   04/22/2020  . Protein electrophoresis, serum    Standing Status:   Future    Standing Expiration Date:   04/22/2020  . Kappa/lambda light chains    Standing Status:   Future    Standing Expiration Date:   04/22/2020  . IgG, IgA, IgM    Standing Status:   Future    Standing Expiration Date:   04/22/2020       Zoila Shutter MD

## 2018-04-28 DIAGNOSIS — E785 Hyperlipidemia, unspecified: Secondary | ICD-10-CM | POA: Diagnosis not present

## 2018-04-28 DIAGNOSIS — J302 Other seasonal allergic rhinitis: Secondary | ICD-10-CM | POA: Diagnosis not present

## 2018-04-28 DIAGNOSIS — K219 Gastro-esophageal reflux disease without esophagitis: Secondary | ICD-10-CM | POA: Diagnosis not present

## 2018-04-28 DIAGNOSIS — I10 Essential (primary) hypertension: Secondary | ICD-10-CM | POA: Diagnosis not present

## 2018-04-28 DIAGNOSIS — E039 Hypothyroidism, unspecified: Secondary | ICD-10-CM | POA: Diagnosis not present

## 2018-04-29 DIAGNOSIS — S90415D Abrasion, left lesser toe(s), subsequent encounter: Secondary | ICD-10-CM | POA: Diagnosis not present

## 2018-05-02 DIAGNOSIS — K219 Gastro-esophageal reflux disease without esophagitis: Secondary | ICD-10-CM | POA: Diagnosis not present

## 2018-05-02 DIAGNOSIS — J302 Other seasonal allergic rhinitis: Secondary | ICD-10-CM | POA: Diagnosis not present

## 2018-05-02 DIAGNOSIS — E785 Hyperlipidemia, unspecified: Secondary | ICD-10-CM | POA: Diagnosis not present

## 2018-05-02 DIAGNOSIS — I10 Essential (primary) hypertension: Secondary | ICD-10-CM | POA: Diagnosis not present

## 2018-05-02 DIAGNOSIS — E039 Hypothyroidism, unspecified: Secondary | ICD-10-CM | POA: Diagnosis not present

## 2018-05-05 DIAGNOSIS — E039 Hypothyroidism, unspecified: Secondary | ICD-10-CM | POA: Diagnosis not present

## 2018-05-05 DIAGNOSIS — E785 Hyperlipidemia, unspecified: Secondary | ICD-10-CM | POA: Diagnosis not present

## 2018-05-05 DIAGNOSIS — J302 Other seasonal allergic rhinitis: Secondary | ICD-10-CM | POA: Diagnosis not present

## 2018-05-05 DIAGNOSIS — K219 Gastro-esophageal reflux disease without esophagitis: Secondary | ICD-10-CM | POA: Diagnosis not present

## 2018-05-05 DIAGNOSIS — I10 Essential (primary) hypertension: Secondary | ICD-10-CM | POA: Diagnosis not present

## 2018-05-08 DIAGNOSIS — I10 Essential (primary) hypertension: Secondary | ICD-10-CM | POA: Diagnosis not present

## 2018-05-08 DIAGNOSIS — J302 Other seasonal allergic rhinitis: Secondary | ICD-10-CM | POA: Diagnosis not present

## 2018-05-08 DIAGNOSIS — E785 Hyperlipidemia, unspecified: Secondary | ICD-10-CM | POA: Diagnosis not present

## 2018-05-08 DIAGNOSIS — E039 Hypothyroidism, unspecified: Secondary | ICD-10-CM | POA: Diagnosis not present

## 2018-05-08 DIAGNOSIS — K219 Gastro-esophageal reflux disease without esophagitis: Secondary | ICD-10-CM | POA: Diagnosis not present

## 2018-05-09 DIAGNOSIS — H21233 Degeneration of iris (pigmentary), bilateral: Secondary | ICD-10-CM | POA: Diagnosis not present

## 2018-05-09 DIAGNOSIS — H52203 Unspecified astigmatism, bilateral: Secondary | ICD-10-CM | POA: Diagnosis not present

## 2018-05-09 DIAGNOSIS — H1789 Other corneal scars and opacities: Secondary | ICD-10-CM | POA: Diagnosis not present

## 2018-05-09 DIAGNOSIS — Z961 Presence of intraocular lens: Secondary | ICD-10-CM | POA: Diagnosis not present

## 2018-05-14 DIAGNOSIS — J300 Vasomotor rhinitis: Secondary | ICD-10-CM | POA: Diagnosis not present

## 2018-05-14 DIAGNOSIS — K219 Gastro-esophageal reflux disease without esophagitis: Secondary | ICD-10-CM | POA: Diagnosis not present

## 2018-05-14 DIAGNOSIS — J3089 Other allergic rhinitis: Secondary | ICD-10-CM | POA: Diagnosis not present

## 2018-05-14 DIAGNOSIS — J4530 Mild persistent asthma, uncomplicated: Secondary | ICD-10-CM | POA: Diagnosis not present

## 2018-05-16 DIAGNOSIS — I10 Essential (primary) hypertension: Secondary | ICD-10-CM | POA: Diagnosis not present

## 2018-05-16 DIAGNOSIS — J302 Other seasonal allergic rhinitis: Secondary | ICD-10-CM | POA: Diagnosis not present

## 2018-05-16 DIAGNOSIS — K219 Gastro-esophageal reflux disease without esophagitis: Secondary | ICD-10-CM | POA: Diagnosis not present

## 2018-05-16 DIAGNOSIS — E039 Hypothyroidism, unspecified: Secondary | ICD-10-CM | POA: Diagnosis not present

## 2018-05-16 DIAGNOSIS — E785 Hyperlipidemia, unspecified: Secondary | ICD-10-CM | POA: Diagnosis not present

## 2018-05-19 DIAGNOSIS — J302 Other seasonal allergic rhinitis: Secondary | ICD-10-CM | POA: Diagnosis not present

## 2018-05-19 DIAGNOSIS — I10 Essential (primary) hypertension: Secondary | ICD-10-CM | POA: Diagnosis not present

## 2018-05-19 DIAGNOSIS — K219 Gastro-esophageal reflux disease without esophagitis: Secondary | ICD-10-CM | POA: Diagnosis not present

## 2018-05-19 DIAGNOSIS — E039 Hypothyroidism, unspecified: Secondary | ICD-10-CM | POA: Diagnosis not present

## 2018-05-19 DIAGNOSIS — E785 Hyperlipidemia, unspecified: Secondary | ICD-10-CM | POA: Diagnosis not present

## 2018-05-21 DIAGNOSIS — M79675 Pain in left toe(s): Secondary | ICD-10-CM | POA: Diagnosis not present

## 2018-06-07 ENCOUNTER — Emergency Department (HOSPITAL_COMMUNITY)
Admission: EM | Admit: 2018-06-07 | Discharge: 2018-06-07 | Disposition: A | Discharge status: Home / Self Care | Payer: Medicare Other | Attending: Emergency Medicine | Admitting: Emergency Medicine

## 2018-06-07 ENCOUNTER — Emergency Department (HOSPITAL_COMMUNITY): Payer: Medicare Other

## 2018-06-07 ENCOUNTER — Encounter (HOSPITAL_COMMUNITY): Payer: Self-pay | Admitting: Emergency Medicine

## 2018-06-07 DIAGNOSIS — Z7982 Long term (current) use of aspirin: Secondary | ICD-10-CM | POA: Diagnosis not present

## 2018-06-07 DIAGNOSIS — Y9389 Activity, other specified: Secondary | ICD-10-CM | POA: Insufficient documentation

## 2018-06-07 DIAGNOSIS — I10 Essential (primary) hypertension: Secondary | ICD-10-CM | POA: Insufficient documentation

## 2018-06-07 DIAGNOSIS — Y999 Unspecified external cause status: Secondary | ICD-10-CM | POA: Insufficient documentation

## 2018-06-07 DIAGNOSIS — W109XXA Fall (on) (from) unspecified stairs and steps, initial encounter: Secondary | ICD-10-CM | POA: Insufficient documentation

## 2018-06-07 DIAGNOSIS — Z8673 Personal history of transient ischemic attack (TIA), and cerebral infarction without residual deficits: Secondary | ICD-10-CM | POA: Diagnosis not present

## 2018-06-07 DIAGNOSIS — S99821A Other specified injuries of right foot, initial encounter: Secondary | ICD-10-CM | POA: Diagnosis present

## 2018-06-07 DIAGNOSIS — S79921A Unspecified injury of right thigh, initial encounter: Secondary | ICD-10-CM | POA: Diagnosis not present

## 2018-06-07 DIAGNOSIS — E039 Hypothyroidism, unspecified: Secondary | ICD-10-CM | POA: Diagnosis not present

## 2018-06-07 DIAGNOSIS — Z87891 Personal history of nicotine dependence: Secondary | ICD-10-CM | POA: Diagnosis not present

## 2018-06-07 DIAGNOSIS — S8011XA Contusion of right lower leg, initial encounter: Secondary | ICD-10-CM | POA: Insufficient documentation

## 2018-06-07 DIAGNOSIS — Z79899 Other long term (current) drug therapy: Secondary | ICD-10-CM | POA: Diagnosis not present

## 2018-06-07 DIAGNOSIS — Z85828 Personal history of other malignant neoplasm of skin: Secondary | ICD-10-CM | POA: Diagnosis not present

## 2018-06-07 DIAGNOSIS — Z96611 Presence of right artificial shoulder joint: Secondary | ICD-10-CM | POA: Diagnosis not present

## 2018-06-07 DIAGNOSIS — Y9289 Other specified places as the place of occurrence of the external cause: Secondary | ICD-10-CM | POA: Insufficient documentation

## 2018-06-07 DIAGNOSIS — Z96651 Presence of right artificial knee joint: Secondary | ICD-10-CM | POA: Insufficient documentation

## 2018-06-07 DIAGNOSIS — J45909 Unspecified asthma, uncomplicated: Secondary | ICD-10-CM | POA: Insufficient documentation

## 2018-06-07 DIAGNOSIS — S9031XA Contusion of right foot, initial encounter: Secondary | ICD-10-CM | POA: Diagnosis not present

## 2018-06-07 DIAGNOSIS — M79651 Pain in right thigh: Secondary | ICD-10-CM | POA: Diagnosis not present

## 2018-06-07 DIAGNOSIS — M79671 Pain in right foot: Secondary | ICD-10-CM | POA: Diagnosis not present

## 2018-06-07 DIAGNOSIS — S99921A Unspecified injury of right foot, initial encounter: Secondary | ICD-10-CM | POA: Diagnosis not present

## 2018-06-07 NOTE — ED Provider Notes (Signed)
[facility] EMERGENCY DEPARTMENT Provider Note   CSN: [id number] Arrival date & time: 06/07/18  1242     History   Chief Complaint Chief Complaint  Patient presents with  . Fall  . Foot Pain    HPI Christine Leblanc is a 84 y.o. female.  The history is provided by the patient. No language interpreter was used.  Fall  This is a new problem. The current episode started yesterday. The problem occurs constantly. The problem has been gradually worsening. Nothing aggravates the symptoms. Nothing relieves the symptoms. She has tried nothing for the symptoms.  Foot Pain   Patient reports she filled on the step at a estate sale.  Patient reports she had some soreness in her right upper leg and her right foot.  She took ibuprofen yesterday and went to bed.  Complains of some continued soreness today.  Past Medical History:  Diagnosis Date  . Anemia   . Asthma   . Cancer ([place])    skin Ca- ? basal cell   . Chronic back pain   . Chronic neck pain   . Degenerative joint disease    Left shoulder; cervical spine, knees & hands   . Gastroesophageal reflux disease    Hiatal hernia; distal esophageal web requiring dilatation; gastric polyps; gastritis; refuses colonoscopy  . GERD (gastroesophageal reflux disease)   . History of stress test 1990's   stress test done under the care of Dr. Lattie Haw & Dr. Gwenlyn Found, now being followed by Dr. Harl Bowie- in [place] , recently seen & told to f/U in one yr.   . Hyperlipidemia 04/27/2011  . Hypertension   . Hypothyroidism   . Lymphocytic thyroiditis   . Multiple thyroid nodules 2010   Adenomatous; thyroidectomy in 2010  . Osteoarthritis of right knee 07/27/2014  . Pneumonia    hosp. for pneumonia- long time ago   . Primary localized osteoarthrosis of right shoulder 06/21/2015  . Seizures ([place])    yes- as a child- & into adult years, states she took med. for them at one time, stopped at 32 yrs. of age   . Syncope and collapse 2007   Possible CVA in  2012 with left lower extremity weakness; refused hospitalization; CT-Atrophy and chronic microvascular ischemic change.     Patient Active Problem List   Diagnosis Date Noted  . MGUS (monoclonal gammopathy of unknown significance) 10/21/2017  . Primary localized osteoarthrosis of right shoulder 06/21/2015  . S/P shoulder replacement 06/21/2015  . Osteoarthritis of right knee 07/27/2014  . Postsurgical hypothyroidism 04/22/2014  . Syncope and collapse   . Anemia, normocytic normochromic 04/26/2012  . Gastroesophageal reflux disease   . Hypertension 12/07/2011  . History of TIAs 12/07/2011  . Leg pain, bilateral 04/27/2011  . Hyperlipidemia 04/27/2011  . Other dysphagia 06/07/2010    Past Surgical History:  Procedure Laterality Date  . ABDOMINAL HYSTERECTOMY     fibroids  . CATARACT EXTRACTION     Bilateral; redo surgery on the right for incomplete primary procedure  . COLONOSCOPY  Remote  . ESOPHAGOGASTRODUODENOSCOPY  12/10   Dr. 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 ESOPHAGOGASTRODUODENOSCOPY N/A 03/21/2015   Procedure: ESOPHAGOGASTRODUODENOSCOPY (EGD);  Surgeon: Danie Binder, MD;  [facility];  Service: Endoscopy;  Laterality: N/A;  1130   . EYE SURGERY    . PROLAPSED UTERINE FIBROID LIGATION     outcomed with rectocele & cystocele  . SAVORY DILATION N/A 03/21/2015  Procedure: SAVORY DILATION;  Surgeon: Danie Binder, MD;  [facility];  Service: Endoscopy;  Laterality: N/A;  . SHOULDER SURGERY     left  . TOTAL KNEE ARTHROPLASTY Right 07/27/2014   Procedure: RIGHT TOTAL KNEE ARTHROPLASTY;  Surgeon: Johnny Bridge, MD;  Location: [place];  Service: Orthopedics;  Laterality: Right;  . TOTAL SHOULDER ARTHROPLASTY Right 06/21/2015   Procedure: RIGHT TOTAL SHOULDER ARTHROPLASTY;  Surgeon: Marchia Bond, MD;  Location: [place];  Service: Orthopedics;  Laterality: Right;  . TOTAL THYROIDECTOMY  2010      OB History   None      Home Medications    Prior to Admission medications   Medication Sig Start Date End Date Taking? Authorizing Provider  albuterol (PROVENTIL HFA) 108 (90 BASE) MCG/ACT inhaler Inhale 1-2 puffs into the lungs every 6 (six) hours as needed for wheezing or shortness of breath. Wheezing, Asthma Symptoms    [provider]  Ascorbic Acid (VITAMIN C PO) Take 1 tablet by mouth daily.    [provider]  aspirin 81 MG tablet Take 81 mg by mouth daily.    [provider]  Bisacodyl (DULCOLAX PO) Take 1 tablet by mouth at bedtime.    [provider]  calcium-vitamin D (OSCAL WITH D) 500-200 MG-UNIT per tablet Take 1 tablet by mouth daily.     [provider]  diazepam (VALIUM) 5 MG tablet Take one tablet by mouth at bedtime as needed for sleep Patient taking differently: Take 2.5-5 mg by mouth at bedtime as needed (sleep).  08/04/14   Estill Dooms, MD  Glucosamine HCl (GLUCOSAMINE PO) Take 1 tablet by mouth 2 (two) times daily.    [provider]  ipratropium (ATROVENT) 0.03 % nasal spray  04/25/17   [provider]  levocetirizine (XYZAL) 5 MG tablet Take 5 mg by mouth every evening.      [provider]  levothyroxine (SYNTHROID, LEVOTHROID) 75 MCG tablet TAKE 1 TABLET BY MOUTH ONCE A DAY. 04/19/18   Elayne Snare, MD  montelukast (SINGULAIR) 10 MG tablet Take 10 mg by mouth at bedtime.    [provider]  omeprazole (PRILOSEC) 20 MG capsule 1 PO 30 mins prior to breakfast and supper 12/05/17   Carlis Stable, NP  pravastatin (PRAVACHOL) 80 MG tablet Take 1 tablet (80 mg total) by mouth every evening. 03/03/18   Arnoldo Lenis, MD  pyridOXINE (VITAMIN B-6) 100 MG tablet Take 100 mg by mouth at bedtime.    [provider]  senna (SENOKOT) 8.6 MG tablet Take 1 tablet by mouth daily.    [provider]  vitamin B-12 (CYANOCOBALAMIN) 1000 MCG tablet Take 1,000 mcg by mouth every  morning.    [provider]  vitamin E 400 UNIT capsule Take 400 Units by mouth at bedtime.     [provider]    Family History Family History  Problem Relation Age of Onset  . Heart disease Mother   . Gallbladder disease Mother   . Heart failure Mother   . Breast cancer Sister        30 years ago  . Heart attack Father   . Colon cancer Neg Hx   . Stroke Neg Hx     Social History Social History   Tobacco Use  . Smoking status: Former Smoker    Packs/day: 0.80    Years: 20.00    Pack years: 16.00    Types: Cigarettes  Last attempt to quit: 12/31/1978    Years since quitting: 39.4  . Smokeless tobacco: Never Used  Substance Use Topics  . Alcohol use: No  . Drug use: No     Allergies   Celecoxib and Dexilant [dexlansoprazole]   Review of Systems Review of Systems  All other systems reviewed and are negative.    Physical Exam Updated Vital Signs BP (!) 132/99 (BP Location: Right Arm)   Pulse 88   Temp 98.4 F (36.9 C) (Oral)   Resp 20   SpO2 100%   Physical Exam  Constitutional: She is oriented to person, place, and time. She appears well-developed and well-nourished.  HENT:  Head: Normocephalic.  Eyes: Pupils are equal, round, and reactive to light.  Neck: Normal range of motion.  Cardiovascular: Normal rate.  Pulmonary/Chest: Effort normal.  Musculoskeletal: She exhibits tenderness.  Tender right foot diffusely,  No puncture wound.  Bruised area right upper leg, nv and ns intact   Neurological: She is alert and oriented to person, place, and time.  Skin: Skin is warm.  Psychiatric: She has a normal mood and affect.  Nursing note and vitals reviewed.    ED Treatments / Results  Labs (all labs ordered are listed, but only abnormal results are displayed) Labs Reviewed - No data to display  EKG None  Radiology Dg Foot Complete Right  Result Date: 06/07/2018 CLINICAL DATA:  Pain following fall EXAM: RIGHT FOOT COMPLETE - 3+  VIEW COMPARISON:  None. FINDINGS: Frontal, oblique, and lateral views were obtained. There is a metallic foreign body volar to the midportion of the first metatarsal measuring 9 mm in length. There is no evident fracture or dislocation. There is hallux valgus deformity at the first MTP joint with bony overgrowth first metatarsal. There are several small calcifications in this area, likely of arthropathic etiology. There is bunion formation. There is moderate narrowing of all PIP and DIP joints as well as first MTP joint. No erosive changes are evident. No acute fracture or dislocation evident. There are posterior and inferior calcaneal spurs. There is a small exostosis arising from the dorsal distal talus. Bones are osteoporotic. IMPRESSION: No evident fracture or dislocation. Linear metallic foreign body volar to the mid first metatarsal measuring 9 mm in length. Multilevel arthropathy. Marked hallux valgus deformity at the first MTP joint with osteoarthritis and bunion formation. Bones osteoporotic. Electronically Signed   By: Lowella Grip III M.D.   On: 06/07/2018 14:19   Dg Femur Min 2 Views Right  Result Date: 06/07/2018 CLINICAL DATA:  Pain following fall EXAM: RIGHT FEMUR 2 VIEWS COMPARISON:  None. FINDINGS: Frontal and lateral views were obtained. There is a total knee replacement with prosthetic components well-seated. No fracture or dislocation. There is mild narrowing of the right hip joint. No erosive change. IMPRESSION: Total knee replacement with prosthetic components well-seated. No fracture or dislocation. Mild narrowing right hip joint. Electronically Signed   By: Lowella Grip III M.D.   On: 06/07/2018 14:20    Procedures Procedures (including critical care time)  Medications Ordered in ED Medications - No data to display   Initial Impression / Assessment and Plan / ED Course  I have reviewed the triage vital signs and the nursing notes.  Pertinent labs & imaging results  that were available during my care of the patient were reviewed by me and considered in my medical decision making (see chart for details).     Medical decision making: X-ray of right femur and  right foot are negative right foot x-ray shows a small metal foreign body.  NO  puncture wound on her foot as I suspect foreign body is old.  Patient reports good resolution of pain with ibuprofen today.  She is advised to continue ibuprofen as needed aloe up with her primary care physician if pain persist  Final Clinical Impressions(s) / ED Diagnoses   Final diagnoses:  Contusion of right foot, initial encounter  Contusion of right lower extremity, initial encounter    ED Discharge Orders    None    An After Visit Summary was printed and given to the patient.   Fransico Meadow, PA-C 06/07/18 1559    Nat Christen, MD 06/08/18 [phone]

## 2018-06-07 NOTE — Discharge Instructions (Signed)
Return if any problems.  Ibuprofen as needed.

## 2018-06-07 NOTE — ED Triage Notes (Signed)
Pt reports right foot pain following a fall yesterday walking down steps without a railing.  Also right leg pain.

## 2018-06-13 DIAGNOSIS — J302 Other seasonal allergic rhinitis: Secondary | ICD-10-CM | POA: Diagnosis not present

## 2018-06-13 DIAGNOSIS — E039 Hypothyroidism, unspecified: Secondary | ICD-10-CM | POA: Diagnosis not present

## 2018-06-13 DIAGNOSIS — I10 Essential (primary) hypertension: Secondary | ICD-10-CM | POA: Diagnosis not present

## 2018-06-13 DIAGNOSIS — K219 Gastro-esophageal reflux disease without esophagitis: Secondary | ICD-10-CM | POA: Diagnosis not present

## 2018-06-13 DIAGNOSIS — E785 Hyperlipidemia, unspecified: Secondary | ICD-10-CM | POA: Diagnosis not present

## 2018-06-24 ENCOUNTER — Encounter: Payer: Self-pay | Admitting: Gastroenterology

## 2018-06-30 DIAGNOSIS — I10 Essential (primary) hypertension: Secondary | ICD-10-CM | POA: Diagnosis not present

## 2018-06-30 DIAGNOSIS — K219 Gastro-esophageal reflux disease without esophagitis: Secondary | ICD-10-CM | POA: Diagnosis not present

## 2018-06-30 DIAGNOSIS — J302 Other seasonal allergic rhinitis: Secondary | ICD-10-CM | POA: Diagnosis not present

## 2018-06-30 DIAGNOSIS — Z681 Body mass index (BMI) 19 or less, adult: Secondary | ICD-10-CM | POA: Diagnosis not present

## 2018-06-30 DIAGNOSIS — J060 Acute laryngopharyngitis: Secondary | ICD-10-CM | POA: Diagnosis not present

## 2018-06-30 DIAGNOSIS — E039 Hypothyroidism, unspecified: Secondary | ICD-10-CM | POA: Diagnosis not present

## 2018-06-30 DIAGNOSIS — E785 Hyperlipidemia, unspecified: Secondary | ICD-10-CM | POA: Diagnosis not present

## 2018-07-07 DIAGNOSIS — J3089 Other allergic rhinitis: Secondary | ICD-10-CM | POA: Diagnosis not present

## 2018-07-07 DIAGNOSIS — J301 Allergic rhinitis due to pollen: Secondary | ICD-10-CM | POA: Diagnosis not present

## 2018-07-14 DIAGNOSIS — E039 Hypothyroidism, unspecified: Secondary | ICD-10-CM | POA: Diagnosis not present

## 2018-07-14 DIAGNOSIS — K219 Gastro-esophageal reflux disease without esophagitis: Secondary | ICD-10-CM | POA: Diagnosis not present

## 2018-07-14 DIAGNOSIS — I10 Essential (primary) hypertension: Secondary | ICD-10-CM | POA: Diagnosis not present

## 2018-07-14 DIAGNOSIS — J060 Acute laryngopharyngitis: Secondary | ICD-10-CM | POA: Diagnosis not present

## 2018-07-14 DIAGNOSIS — Z681 Body mass index (BMI) 19 or less, adult: Secondary | ICD-10-CM | POA: Diagnosis not present

## 2018-07-14 DIAGNOSIS — E785 Hyperlipidemia, unspecified: Secondary | ICD-10-CM | POA: Diagnosis not present

## 2018-07-14 DIAGNOSIS — J302 Other seasonal allergic rhinitis: Secondary | ICD-10-CM | POA: Diagnosis not present

## 2018-07-21 ENCOUNTER — Other Ambulatory Visit: Payer: Self-pay

## 2018-07-21 DIAGNOSIS — J302 Other seasonal allergic rhinitis: Secondary | ICD-10-CM | POA: Diagnosis not present

## 2018-07-21 MED ORDER — LEVOTHYROXINE SODIUM 75 MCG PO TABS: 75.0000 ug | ORAL_TABLET | Freq: Every day | ORAL | 2 refills | Status: DC

## 2018-07-28 DIAGNOSIS — J302 Other seasonal allergic rhinitis: Secondary | ICD-10-CM | POA: Diagnosis not present

## 2018-08-11 DIAGNOSIS — J302 Other seasonal allergic rhinitis: Secondary | ICD-10-CM | POA: Diagnosis not present

## 2018-08-13 DIAGNOSIS — J3089 Other allergic rhinitis: Secondary | ICD-10-CM | POA: Diagnosis not present

## 2018-08-13 DIAGNOSIS — K219 Gastro-esophageal reflux disease without esophagitis: Secondary | ICD-10-CM | POA: Diagnosis not present

## 2018-08-13 DIAGNOSIS — J300 Vasomotor rhinitis: Secondary | ICD-10-CM | POA: Diagnosis not present

## 2018-08-13 DIAGNOSIS — J4530 Mild persistent asthma, uncomplicated: Secondary | ICD-10-CM | POA: Diagnosis not present

## 2018-08-18 DIAGNOSIS — E785 Hyperlipidemia, unspecified: Secondary | ICD-10-CM | POA: Diagnosis not present

## 2018-08-18 DIAGNOSIS — J060 Acute laryngopharyngitis: Secondary | ICD-10-CM | POA: Diagnosis not present

## 2018-08-18 DIAGNOSIS — Z681 Body mass index (BMI) 19 or less, adult: Secondary | ICD-10-CM | POA: Diagnosis not present

## 2018-08-18 DIAGNOSIS — J302 Other seasonal allergic rhinitis: Secondary | ICD-10-CM | POA: Diagnosis not present

## 2018-08-18 DIAGNOSIS — E039 Hypothyroidism, unspecified: Secondary | ICD-10-CM | POA: Diagnosis not present

## 2018-08-18 DIAGNOSIS — K219 Gastro-esophageal reflux disease without esophagitis: Secondary | ICD-10-CM | POA: Diagnosis not present

## 2018-08-18 DIAGNOSIS — I10 Essential (primary) hypertension: Secondary | ICD-10-CM | POA: Diagnosis not present

## 2018-09-04 ENCOUNTER — Ambulatory Visit (INDEPENDENT_AMBULATORY_CARE_PROVIDER_SITE_OTHER): Payer: Medicare Other | Admitting: Otolaryngology

## 2018-09-04 DIAGNOSIS — H903 Sensorineural hearing loss, bilateral: Secondary | ICD-10-CM

## 2018-09-04 DIAGNOSIS — H6121 Impacted cerumen, right ear: Secondary | ICD-10-CM

## 2018-09-08 DIAGNOSIS — J302 Other seasonal allergic rhinitis: Secondary | ICD-10-CM | POA: Diagnosis not present

## 2018-09-11 ENCOUNTER — Ambulatory Visit (INDEPENDENT_AMBULATORY_CARE_PROVIDER_SITE_OTHER): Payer: Medicare Other | Admitting: Endocrinology

## 2018-09-11 ENCOUNTER — Encounter: Payer: Self-pay | Admitting: Endocrinology

## 2018-09-11 VITALS — BP 130/60 | HR 83 | Ht 62.0 in | Wt 113.0 lb

## 2018-09-11 DIAGNOSIS — E890 Postprocedural hypothyroidism: Secondary | ICD-10-CM

## 2018-09-11 DIAGNOSIS — Z23 Encounter for immunization: Secondary | ICD-10-CM | POA: Diagnosis not present

## 2018-09-11 LAB — T4, FREE: FREE T4: 1.22 ng/dL (ref 0.60–1.60)

## 2018-09-11 LAB — TSH: TSH: 2.19 u[IU]/mL (ref 0.35–4.50)

## 2018-09-11 NOTE — Progress Notes (Signed)
Christine Leblanc 84 y.o.    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  She has been followed every 6-12 months for her thyroid supplementation and monitoring of her levels However has not been seen in follow-up since 9/18  She previously had been on 100 mcg since 03/2014 This was subsequently reduced down to 75 g  She appears to have a relatively stable weight no, previously losing weight She may get tired but not any different than usual  She has mild chronic cold intolerance  She does try to take her levothyroxine before breakfast daily but also taking Prilosec at the same time as before  Wt Readings from Last 3 Encounters:  09/11/18 113 lb (51.3 kg)  04/22/18 113 lb 12.8 oz (51.6 kg)  02/13/18 114 lb 6.4 oz (51.9 kg)   Lab Results  Component Value Date   TSH 1.150 09/12/2017   TSH 0.13 (L) 05/01/2017   TSH 6.020 (H) 01/24/2017   FREET4 1.78 (H) 09/12/2017   FREET4 1.75 (H) 05/01/2017   FREET4 1.66 01/24/2017       Allergies as of 09/11/2018      Reactions   Celecoxib Shortness Of Breath   Dexilant [dexlansoprazole] Anaphylaxis   abd pain      Medication List        Accurate as of 09/11/18  2:19 PM. Always use your most recent med list.          aspirin 81 MG tablet Take 81 mg by mouth daily.   calcium-vitamin D 500-200 MG-UNIT tablet Commonly known as:  OSCAL WITH D Take 1 tablet by mouth daily.   diazepam 5 MG tablet Commonly known as:  VALIUM Take one tablet by mouth at bedtime as needed for sleep   DULCOLAX PO Take 1 tablet by mouth at bedtime.   GLUCOSAMINE PO Take 1 tablet by mouth 2 (two) times daily.   ipratropium 0.03 % nasal spray Commonly known as:  ATROVENT   levocetirizine 5 MG  tablet Commonly known as:  XYZAL Take 5 mg by mouth every evening.   levothyroxine 75 MCG tablet Commonly known as:  SYNTHROID, LEVOTHROID Take 1 tablet (75 mcg total) by mouth daily.   montelukast 10 MG tablet Commonly known as:  SINGULAIR Take 10 mg by mouth at bedtime.   omeprazole 20 MG capsule Commonly known as:  PRILOSEC 1 PO 30 mins prior to breakfast and supper   pravastatin 80 MG tablet Commonly known as:  PRAVACHOL Take 1 tablet (80 mg total) by mouth every evening.   PROVENTIL HFA 108 (90 Base) MCG/ACT inhaler Generic drug:  albuterol Inhale 1-2 puffs into the lungs every 6 (six) hours as needed for wheezing or shortness of breath. Wheezing, Asthma Symptoms   pyridOXINE 100 MG tablet Commonly known as:  VITAMIN B-6 Take 100 mg by mouth at bedtime.   senna 8.6 MG tablet Commonly known as:  SENOKOT Take 1 tablet by mouth daily.   vitamin B-12 1000 MCG tablet Commonly known as:  CYANOCOBALAMIN Take 1,000 mcg by mouth every morning.   VITAMIN C PO Take 1 tablet by mouth daily.   vitamin E 400 UNIT capsule Take 400 Units by mouth at bedtime.       Allergies:  Allergies  Allergen Reactions  . Celecoxib Shortness Of Breath  . Dexilant [Dexlansoprazole] Anaphylaxis    abd pain    Past Medical History:  Diagnosis Date  . Anemia   . Asthma   . Cancer ([place])    skin Ca- ? basal cell   . Chronic back pain   . Chronic neck pain   . Degenerative joint disease    Left shoulder; cervical spine, knees & hands   . Gastroesophageal reflux disease    Hiatal hernia; distal esophageal web requiring dilatation; gastric polyps; gastritis; refuses colonoscopy  . GERD (gastroesophageal reflux disease)   . History of stress test 1990's   stress test done under the care of Dr. Lattie Haw & Dr. Gwenlyn Found, now being followed by Dr. Harl Bowie- in [place] , recently seen & told to f/U in one yr.   . Hyperlipidemia 04/27/2011  . Hypertension   . Hypothyroidism   . Lymphocytic  thyroiditis   . Multiple thyroid nodules 2010   Adenomatous; thyroidectomy in 2010  . Osteoarthritis of right knee 07/27/2014  . Pneumonia    hosp. for pneumonia- long time ago   . Primary localized osteoarthrosis of right shoulder 06/21/2015  . Seizures ([place])    yes- as a child- & into adult years, states she took med. for them at one time, stopped at 32 yrs. of age   . Syncope and collapse 2007   Possible CVA in 2012 with left lower extremity weakness; refused hospitalization; CT-Atrophy and chronic microvascular ischemic change.     Past Surgical History:  Procedure Laterality Date  . ABDOMINAL HYSTERECTOMY     fibroids  . CATARACT EXTRACTION     Bilateral; redo surgery on the right for incomplete primary procedure  . COLONOSCOPY  Remote  . ESOPHAGOGASTRODUODENOSCOPY  12/10   Dr. 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 ESOPHAGOGASTRODUODENOSCOPY N/A 03/21/2015   Procedure: ESOPHAGOGASTRODUODENOSCOPY (EGD);  Surgeon: Danie Binder, MD;  [facility];  Service: Endoscopy;  Laterality: N/A;  1130   . EYE SURGERY    . PROLAPSED UTERINE FIBROID LIGATION     outcomed with rectocele & cystocele  . SAVORY DILATION N/A 03/21/2015   Procedure: SAVORY DILATION;  Surgeon: Danie Binder, MD;  [facility];  Service: Endoscopy;  Laterality: N/A;  . SHOULDER SURGERY     left  . TOTAL KNEE ARTHROPLASTY Right 07/27/2014   Procedure: RIGHT TOTAL KNEE ARTHROPLASTY;  Surgeon: Johnny Bridge, MD;  Location: [place];  Service: Orthopedics;  Laterality: Right;  . TOTAL SHOULDER ARTHROPLASTY Right 06/21/2015   Procedure: RIGHT TOTAL SHOULDER ARTHROPLASTY;  Surgeon: Marchia Bond, MD;  Location: [place];  Service: Orthopedics;  Laterality: Right;  . TOTAL THYROIDECTOMY  2010    Family History  Problem Relation Age of Onset  . Heart disease Mother   . Gallbladder disease Mother   . Heart failure Mother   . Breast cancer Sister         30 years ago  . Heart attack Father   . Colon cancer Neg Hx   . Stroke Neg Hx     Social History:  reports that she quit smoking about 39 years ago. Her smoking use included cigarettes. She has a 16.00 pack-year smoking history. She has never used smokeless tobacco. She reports that she does not drink alcohol or use drugs.  REVIEW Of SYSTEMS:  Review of Systems   Weight History:   Wt Readings from Last 3  Encounters:  09/11/18 113 lb (51.3 kg)  04/22/18 113 lb 12.8 oz (51.6 kg)  02/13/18 114 lb 6.4 oz (51.9 kg)    History of hypercholesterolemia followed by PCP   On B12 and asking about recommended dose, this has been prescribed by PCP for unknown reasons   Examination:   BP 130/60   Pulse 83   Ht 5\' 2"  (1.575 m)   Wt 113 lb (51.3 kg)   BMI 20.67 kg/m   Neck exam normal Biceps reflexes normal No edema    Assessments   Hypothyroidism, postsurgical and long-standing  She has had variable doses previously but more recently on 75 mcg levothyroxine She takes this regularly in the morning but also taking Prilosec  No new symptoms of unusual fatigue or cold intolerance and her exam is unremarkable  B12 deficiency: She has been told to take B12 supplements from her PCP but no records available, she will need to discuss appropriate doses with him  Plan: Will check her thyroid labs and decide on dosage, recommend 6 months follow-up  Influenza vaccine given and patient handout on this given also   There are no Patient Instructions on file for this visit.   Elayne Snare 09/11/2018, 2:19 PM

## 2018-09-12 NOTE — Progress Notes (Signed)
LV- to return call from the office regarding lab result [phone]

## 2018-09-17 ENCOUNTER — Telehealth: Payer: Self-pay

## 2018-09-17 NOTE — Telephone Encounter (Signed)
Pt aware of lab results/agrees with plan/does not need a new Rx for Levothyroxine now but will call when she does/thx dmf

## 2018-09-18 DIAGNOSIS — K219 Gastro-esophageal reflux disease without esophagitis: Secondary | ICD-10-CM | POA: Diagnosis not present

## 2018-09-18 DIAGNOSIS — I10 Essential (primary) hypertension: Secondary | ICD-10-CM | POA: Diagnosis not present

## 2018-09-18 DIAGNOSIS — E785 Hyperlipidemia, unspecified: Secondary | ICD-10-CM | POA: Diagnosis not present

## 2018-09-18 DIAGNOSIS — J060 Acute laryngopharyngitis: Secondary | ICD-10-CM | POA: Diagnosis not present

## 2018-09-18 DIAGNOSIS — E039 Hypothyroidism, unspecified: Secondary | ICD-10-CM | POA: Diagnosis not present

## 2018-09-18 DIAGNOSIS — Z681 Body mass index (BMI) 19 or less, adult: Secondary | ICD-10-CM | POA: Diagnosis not present

## 2018-09-18 DIAGNOSIS — D519 Vitamin B12 deficiency anemia, unspecified: Secondary | ICD-10-CM | POA: Diagnosis not present

## 2018-09-18 DIAGNOSIS — J302 Other seasonal allergic rhinitis: Secondary | ICD-10-CM | POA: Diagnosis not present

## 2018-09-18 DIAGNOSIS — Z0000 Encounter for general adult medical examination without abnormal findings: Secondary | ICD-10-CM | POA: Diagnosis not present

## 2018-09-22 ENCOUNTER — Other Ambulatory Visit: Payer: Self-pay | Admitting: Endocrinology

## 2018-09-22 MED ORDER — LEVOTHYROXINE SODIUM 75 MCG PO TABS: 75.0000 ug | ORAL_TABLET | Freq: Every day | ORAL | 0 refills | Status: DC

## 2018-09-22 NOTE — Telephone Encounter (Signed)
Sent!

## 2018-09-22 NOTE — Telephone Encounter (Signed)
Pt called about needing a refill for levothyroxine (SYNTHROID, LEVOTHROID) 75 MCG tablet no refills.  Pharmacy is [facility], [address] [place]  Fax to [phone]. Please and thank you!  Call pt @ [phone].

## 2018-09-23 DIAGNOSIS — D631 Anemia in chronic kidney disease: Secondary | ICD-10-CM | POA: Diagnosis not present

## 2018-09-23 DIAGNOSIS — E039 Hypothyroidism, unspecified: Secondary | ICD-10-CM | POA: Diagnosis not present

## 2018-09-23 DIAGNOSIS — I10 Essential (primary) hypertension: Secondary | ICD-10-CM | POA: Diagnosis not present

## 2018-09-23 DIAGNOSIS — E785 Hyperlipidemia, unspecified: Secondary | ICD-10-CM | POA: Diagnosis not present

## 2018-09-23 DIAGNOSIS — D519 Vitamin B12 deficiency anemia, unspecified: Secondary | ICD-10-CM | POA: Diagnosis not present

## 2018-09-23 DIAGNOSIS — Z6821 Body mass index (BMI) 21.0-21.9, adult: Secondary | ICD-10-CM | POA: Diagnosis not present

## 2018-09-23 DIAGNOSIS — F411 Generalized anxiety disorder: Secondary | ICD-10-CM | POA: Diagnosis not present

## 2018-09-23 DIAGNOSIS — K219 Gastro-esophageal reflux disease without esophagitis: Secondary | ICD-10-CM | POA: Diagnosis not present

## 2018-09-23 DIAGNOSIS — J302 Other seasonal allergic rhinitis: Secondary | ICD-10-CM | POA: Diagnosis not present

## 2018-09-23 DIAGNOSIS — Z0001 Encounter for general adult medical examination with abnormal findings: Secondary | ICD-10-CM | POA: Diagnosis not present

## 2018-09-26 DIAGNOSIS — S90424D Blister (nonthermal), right lesser toe(s), subsequent encounter: Secondary | ICD-10-CM | POA: Diagnosis not present

## 2018-09-30 ENCOUNTER — Observation Stay (HOSPITAL_COMMUNITY)
Admission: EM | Admit: 2018-09-30 | Discharge: 2018-10-01 | Disposition: A | Discharge status: Home / Self Care | Payer: Medicare Other | Attending: Internal Medicine | Admitting: Internal Medicine

## 2018-09-30 ENCOUNTER — Emergency Department (HOSPITAL_COMMUNITY): Payer: Medicare Other

## 2018-09-30 ENCOUNTER — Other Ambulatory Visit: Payer: Self-pay

## 2018-09-30 ENCOUNTER — Observation Stay (HOSPITAL_COMMUNITY): Payer: Medicare Other

## 2018-09-30 ENCOUNTER — Encounter (HOSPITAL_COMMUNITY): Payer: Self-pay | Admitting: *Deleted

## 2018-09-30 DIAGNOSIS — E876 Hypokalemia: Secondary | ICD-10-CM | POA: Diagnosis not present

## 2018-09-30 DIAGNOSIS — R531 Weakness: Secondary | ICD-10-CM | POA: Insufficient documentation

## 2018-09-30 DIAGNOSIS — R072 Precordial pain: Secondary | ICD-10-CM

## 2018-09-30 DIAGNOSIS — M542 Cervicalgia: Secondary | ICD-10-CM | POA: Insufficient documentation

## 2018-09-30 DIAGNOSIS — R1013 Epigastric pain: Secondary | ICD-10-CM | POA: Insufficient documentation

## 2018-09-30 DIAGNOSIS — R0789 Other chest pain: Secondary | ICD-10-CM | POA: Insufficient documentation

## 2018-09-30 DIAGNOSIS — J45909 Unspecified asthma, uncomplicated: Secondary | ICD-10-CM | POA: Insufficient documentation

## 2018-09-30 DIAGNOSIS — D72828 Other elevated white blood cell count: Secondary | ICD-10-CM

## 2018-09-30 DIAGNOSIS — Z79899 Other long term (current) drug therapy: Secondary | ICD-10-CM | POA: Insufficient documentation

## 2018-09-30 DIAGNOSIS — Z85828 Personal history of other malignant neoplasm of skin: Secondary | ICD-10-CM | POA: Insufficient documentation

## 2018-09-30 DIAGNOSIS — D472 Monoclonal gammopathy: Secondary | ICD-10-CM | POA: Diagnosis not present

## 2018-09-30 DIAGNOSIS — J929 Pleural plaque without asbestos: Secondary | ICD-10-CM | POA: Diagnosis not present

## 2018-09-30 DIAGNOSIS — R5381 Other malaise: Secondary | ICD-10-CM | POA: Diagnosis not present

## 2018-09-30 DIAGNOSIS — Z96611 Presence of right artificial shoulder joint: Secondary | ICD-10-CM | POA: Diagnosis not present

## 2018-09-30 DIAGNOSIS — E039 Hypothyroidism, unspecified: Secondary | ICD-10-CM | POA: Insufficient documentation

## 2018-09-30 DIAGNOSIS — R110 Nausea: Secondary | ICD-10-CM

## 2018-09-30 DIAGNOSIS — R1319 Other dysphagia: Secondary | ICD-10-CM | POA: Diagnosis present

## 2018-09-30 DIAGNOSIS — M4802 Spinal stenosis, cervical region: Secondary | ICD-10-CM | POA: Diagnosis not present

## 2018-09-30 DIAGNOSIS — Z7982 Long term (current) use of aspirin: Secondary | ICD-10-CM | POA: Diagnosis not present

## 2018-09-30 DIAGNOSIS — I10 Essential (primary) hypertension: Secondary | ICD-10-CM | POA: Diagnosis not present

## 2018-09-30 DIAGNOSIS — Z96651 Presence of right artificial knee joint: Secondary | ICD-10-CM | POA: Diagnosis not present

## 2018-09-30 DIAGNOSIS — D72829 Elevated white blood cell count, unspecified: Secondary | ICD-10-CM | POA: Diagnosis not present

## 2018-09-30 DIAGNOSIS — R1310 Dysphagia, unspecified: Principal | ICD-10-CM | POA: Insufficient documentation

## 2018-09-30 DIAGNOSIS — Z87891 Personal history of nicotine dependence: Secondary | ICD-10-CM | POA: Insufficient documentation

## 2018-09-30 DIAGNOSIS — R079 Chest pain, unspecified: Secondary | ICD-10-CM | POA: Diagnosis present

## 2018-09-30 LAB — BASIC METABOLIC PANEL
Anion gap: 8 (ref 5–15)
BUN: 20 mg/dL (ref 8–23)
CALCIUM: 9 mg/dL (ref 8.9–10.3)
CO2: 27 mmol/L (ref 22–32)
Chloride: 107 mmol/L (ref 98–111)
Creatinine, Ser: 0.86 mg/dL (ref 0.44–1.00)
GFR calc Af Amer: >60 mL/min (ref >60)
GFR, EST NON AFRICAN AMERICAN: 59 mL/min — AB (ref >60)
GLUCOSE: 126 mg/dL — AB (ref 70–99)
Potassium: 3.2 mmol/L — ABNORMAL LOW (ref 3.5–5.1)
Sodium: 142 mmol/L (ref 135–145)

## 2018-09-30 LAB — CBC
HEMATOCRIT: 34 % — AB (ref 36.0–46.0)
Hemoglobin: 10.9 g/dL — ABNORMAL LOW (ref 12.0–15.0)
MCH: 29 pg (ref 26.0–34.0)
MCHC: 32.1 g/dL (ref 30.0–36.0)
MCV: 90.4 fL (ref 78.0–100.0)
PLATELETS: 204 10*3/uL (ref 150–400)
RBC: 3.76 MIL/uL — AB (ref 3.87–5.11)
RDW: 14.5 % (ref 11.5–15.5)
WBC: 16.2 10*3/uL — ABNORMAL HIGH (ref 4.0–10.5)

## 2018-09-30 LAB — LACTIC ACID, PLASMA: LACTIC ACID, VENOUS: 2 mmol/L — AB (ref 0.5–1.9)

## 2018-09-30 LAB — HEPATIC FUNCTION PANEL
ALT: 13 U/L (ref 0–44)
AST: 26 U/L (ref 15–41)
Albumin: 3.6 g/dL (ref 3.5–5.0)
Alkaline Phosphatase: 52 U/L (ref 38–126)
BILIRUBIN INDIRECT: 0.7 mg/dL (ref 0.3–0.9)
BILIRUBIN TOTAL: 0.8 mg/dL (ref 0.3–1.2)
Bilirubin, Direct: 0.1 mg/dL (ref 0.0–0.2)
Total Protein: 7.2 g/dL (ref 6.5–8.1)

## 2018-09-30 LAB — PROCALCITONIN: Procalcitonin: 0.5 ng/mL

## 2018-09-30 LAB — I-STAT TROPONIN, ED
Troponin i, poc: 0 ng/mL (ref 0.00–0.08)
Troponin i, poc: 0 ng/mL (ref 0.00–0.08)

## 2018-09-30 LAB — FOLATE: Folate: 18.3 ng/mL (ref >5.9)

## 2018-09-30 LAB — LIPASE, BLOOD: Lipase: 35 U/L (ref 11–51)

## 2018-09-30 LAB — TROPONIN I

## 2018-09-30 MED ORDER — FENTANYL CITRATE (PF) 100 MCG/2ML IJ SOLN
50.0000 ug | Freq: Once | INTRAMUSCULAR | Status: AC
  Administered 2018-09-30: 50 ug via INTRAVENOUS
  Filled 2018-09-30: qty 2

## 2018-09-30 MED ORDER — DIAZEPAM 5 MG PO TABS: 2.5000 mg | ORAL_TABLET | Freq: Every evening | ORAL | Status: DC | PRN

## 2018-09-30 MED ORDER — ONDANSETRON HCL 4 MG/2ML IJ SOLN: 4.0000 mg | Freq: Four times a day (QID) | INTRAMUSCULAR | Status: DC | PRN

## 2018-09-30 MED ORDER — ONDANSETRON HCL 4 MG/2ML IJ SOLN
4.0000 mg | Freq: Once | INTRAMUSCULAR | Status: AC
  Administered 2018-09-30: 4 mg via INTRAVENOUS
  Filled 2018-09-30: qty 2

## 2018-09-30 MED ORDER — IOPAMIDOL (ISOVUE-300) INJECTION 61%
30.0000 mL | Freq: Once | INTRAVENOUS | Status: AC | PRN
  Administered 2018-09-30: 30 mL via ORAL

## 2018-09-30 MED ORDER — VITAMIN C 500 MG PO TABS
500.0000 mg | ORAL_TABLET | Freq: Every day | ORAL | Status: DC
  Administered 2018-09-30 – 2018-10-01 (×2): 500 mg via ORAL
  Filled 2018-09-30 (×4): qty 1

## 2018-09-30 MED ORDER — LEVOTHYROXINE SODIUM 75 MCG PO TABS
75.0000 ug | ORAL_TABLET | Freq: Every day | ORAL | Status: DC
  Administered 2018-09-30 – 2018-10-01 (×2): 75 ug via ORAL
  Filled 2018-09-30: qty 2
  Filled 2018-09-30: qty 1

## 2018-09-30 MED ORDER — MONTELUKAST SODIUM 10 MG PO TABS
10.0000 mg | ORAL_TABLET | Freq: Every day | ORAL | Status: DC
  Administered 2018-09-30: 10 mg via ORAL
  Filled 2018-09-30: qty 1

## 2018-09-30 MED ORDER — SODIUM CHLORIDE 0.9 % IV BOLUS
500.0000 mL | Freq: Once | INTRAVENOUS | Status: AC
  Administered 2018-09-30: 500 mL via INTRAVENOUS

## 2018-09-30 MED ORDER — ENOXAPARIN SODIUM 40 MG/0.4ML ~~LOC~~ SOLN
40.0000 mg | SUBCUTANEOUS | Status: DC
  Administered 2018-09-30: 40 mg via SUBCUTANEOUS
  Filled 2018-09-30: qty 0.4

## 2018-09-30 MED ORDER — VITAMIN E 180 MG (400 UNIT) PO CAPS
400.0000 [IU] | ORAL_CAPSULE | Freq: Every day | ORAL | Status: DC
  Administered 2018-09-30: 400 [IU] via ORAL
  Filled 2018-09-30: qty 1

## 2018-09-30 MED ORDER — CALCIUM CARBONATE-VITAMIN D 500-200 MG-UNIT PO TABS
1.0000 | ORAL_TABLET | Freq: Every day | ORAL | Status: DC
  Administered 2018-09-30 – 2018-10-01 (×2): 1 via ORAL
  Filled 2018-09-30 (×4): qty 1

## 2018-09-30 MED ORDER — VITAMIN B-12 1000 MCG PO TABS
1000.0000 ug | ORAL_TABLET | Freq: Every day | ORAL | Status: DC
  Administered 2018-10-01: 1000 ug via ORAL
  Filled 2018-09-30 (×2): qty 1

## 2018-09-30 MED ORDER — LORATADINE 10 MG PO TABS
10.0000 mg | ORAL_TABLET | Freq: Every evening | ORAL | Status: DC
  Administered 2018-09-30: 10 mg via ORAL
  Filled 2018-09-30: qty 1

## 2018-09-30 MED ORDER — IOPAMIDOL (ISOVUE-370) INJECTION 76%
100.0000 mL | Freq: Once | INTRAVENOUS | Status: AC | PRN
  Administered 2018-09-30: 100 mL via INTRAVENOUS

## 2018-09-30 MED ORDER — SENNA 8.6 MG PO TABS
1.0000 | ORAL_TABLET | Freq: Every day | ORAL | Status: DC
  Administered 2018-09-30 – 2018-10-01 (×2): 8.6 mg via ORAL
  Filled 2018-09-30 (×4): qty 1

## 2018-09-30 MED ORDER — ACETAMINOPHEN 650 MG RE SUPP: 650.0000 mg | Freq: Four times a day (QID) | RECTAL | Status: DC | PRN

## 2018-09-30 MED ORDER — ACETAMINOPHEN 325 MG PO TABS: 650.0000 mg | ORAL_TABLET | Freq: Four times a day (QID) | ORAL | Status: DC | PRN

## 2018-09-30 MED ORDER — ASPIRIN 81 MG PO CHEW
324.0000 mg | CHEWABLE_TABLET | Freq: Once | ORAL | Status: AC
  Administered 2018-09-30: 324 mg via ORAL
  Filled 2018-09-30: qty 4

## 2018-09-30 MED ORDER — GI COCKTAIL ~~LOC~~: 30.0000 mL | Freq: Once | ORAL | Status: DC

## 2018-09-30 MED ORDER — NITROGLYCERIN 0.4 MG SL SUBL
0.4000 mg | SUBLINGUAL_TABLET | Freq: Once | SUBLINGUAL | Status: AC
  Administered 2018-09-30: 0.4 mg via SUBLINGUAL
  Filled 2018-09-30: qty 1

## 2018-09-30 MED ORDER — POTASSIUM CHLORIDE CRYS ER 20 MEQ PO TBCR
20.0000 meq | EXTENDED_RELEASE_TABLET | Freq: Once | ORAL | Status: AC
  Administered 2018-09-30: 20 meq via ORAL
  Filled 2018-09-30: qty 1

## 2018-09-30 MED ORDER — ONDANSETRON HCL 4 MG PO TABS: 4.0000 mg | ORAL_TABLET | Freq: Four times a day (QID) | ORAL | Status: DC | PRN

## 2018-09-30 MED ORDER — ASPIRIN 81 MG PO CHEW
81.0000 mg | CHEWABLE_TABLET | Freq: Every day | ORAL | Status: DC
  Administered 2018-10-01: 81 mg via ORAL
  Filled 2018-09-30: qty 1

## 2018-09-30 MED ORDER — HYDROCODONE-ACETAMINOPHEN 5-325 MG PO TABS
1.0000 | ORAL_TABLET | ORAL | Status: DC | PRN
  Administered 2018-09-30: 1 via ORAL
  Filled 2018-09-30: qty 1

## 2018-09-30 MED ORDER — PRAVASTATIN SODIUM 40 MG PO TABS
80.0000 mg | ORAL_TABLET | Freq: Every evening | ORAL | Status: DC
  Administered 2018-09-30: 80 mg via ORAL
  Filled 2018-09-30: qty 1

## 2018-09-30 MED ORDER — SODIUM CHLORIDE 0.9 % IV SOLN: 1.0000 g | Freq: Once | INTRAVENOUS | Status: DC

## 2018-09-30 MED ORDER — LEVOCETIRIZINE DIHYDROCHLORIDE 5 MG PO TABS: 5.0000 mg | ORAL_TABLET | Freq: Every evening | ORAL | Status: DC

## 2018-09-30 MED ORDER — POTASSIUM CHLORIDE IN NACL 20-0.9 MEQ/L-% IV SOLN
INTRAVENOUS | Status: DC
  Administered 2018-09-30 – 2018-10-01 (×2): via INTRAVENOUS
  Filled 2018-09-30: qty 1000

## 2018-09-30 MED ORDER — HYDRALAZINE HCL 20 MG/ML IJ SOLN: 5.0000 mg | INTRAMUSCULAR | Status: DC | PRN

## 2018-09-30 NOTE — ED Notes (Signed)
Paged Dr. Olevia Bowens about critical value at 2128

## 2018-09-30 NOTE — ED Notes (Signed)
Paged Baltazar Najjar, NP about critical at 2207

## 2018-09-30 NOTE — ED Triage Notes (Signed)
Pt brought in by [facility] with c/o high BP and neck pain. Pt reports the sides of her neck hurt when she turns her head. Pt also c/o mid chest pain with nausea that started this morning around 0730. Pt's BP initially for EMS was 184/99 and then decreased to 166/80 prior to arrival to ED. Pt's CBG 108 for EMS. Pt describes the chest pain as pressure and constant. Denies SOB.

## 2018-09-30 NOTE — ED Provider Notes (Signed)
[facility] EMERGENCY DEPARTMENT Provider Note   CSN: [id number] Arrival date & time: 09/30/18  0940     History   Chief Complaint Chief Complaint  Patient presents with  . Chest Pain    HPI Christine Leblanc is a 84 y.o. female with a history of hypertension, hyperlipidemia, asthma, GERD, questionable prior CVA with ongoing chronic dysphasia presenting with neck and chest pain described as pressure which she woke with around 730 today.  She is concerned that she may have pneumonia given her dysphasia, although denies cough, shortness of breath, fevers or chills or choking episodes.  She has developed nausea and diaphoresis since arriving here, no emesis.   Of note, she has had bowel movements x3 since arrival this morning.  She denies diarrhea, states she takes a stool softener every night and this is her normal morning routine.  The history is provided by the patient and a relative.    Past Medical History:  Diagnosis Date  . Anemia   . Asthma   . Cancer ([place])    skin Ca- ? basal cell   . Chronic back pain   . Chronic neck pain   . Degenerative joint disease    Left shoulder; cervical spine, knees & hands   . Gastroesophageal reflux disease    Hiatal hernia; distal esophageal web requiring dilatation; gastric polyps; gastritis; refuses colonoscopy  . GERD (gastroesophageal reflux disease)   . History of stress test 1990's   stress test done under the care of Dr. Lattie Haw & Dr. Gwenlyn Found, now being followed by Dr. Harl Bowie- in [place] , recently seen & told to f/U in one yr.   . Hyperlipidemia 04/27/2011  . Hypertension   . Hypothyroidism   . Lymphocytic thyroiditis   . Multiple thyroid nodules 2010   Adenomatous; thyroidectomy in 2010  . Osteoarthritis of right knee 07/27/2014  . Pneumonia    hosp. for pneumonia- long time ago   . Primary localized osteoarthrosis of right shoulder 06/21/2015  . Seizures ([place])    yes- as a child- & into adult years, states she took med. for  them at one time, stopped at 32 yrs. of age   . Syncope and collapse 2007   Possible CVA in 2012 with left lower extremity weakness; refused hospitalization; CT-Atrophy and chronic microvascular ischemic change.     Patient Active Problem List   Diagnosis Date Noted  . MGUS (monoclonal gammopathy of unknown significance) 10/21/2017  . Primary localized osteoarthrosis of right shoulder 06/21/2015  . S/P shoulder replacement 06/21/2015  . Osteoarthritis of right knee 07/27/2014  . Postsurgical hypothyroidism 04/22/2014  . Syncope and collapse   . Anemia, normocytic normochromic 04/26/2012  . Gastroesophageal reflux disease   . Hypertension 12/07/2011  . History of TIAs 12/07/2011  . Leg pain, bilateral 04/27/2011  . Hyperlipidemia 04/27/2011  . Other dysphagia 06/07/2010    Past Surgical History:  Procedure Laterality Date  . ABDOMINAL HYSTERECTOMY     fibroids  . CATARACT EXTRACTION     Bilateral; redo surgery on the right for incomplete primary procedure  . COLONOSCOPY  Remote  . ESOPHAGOGASTRODUODENOSCOPY  12/10   Dr. 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 ESOPHAGOGASTRODUODENOSCOPY N/A 03/21/2015   Procedure: ESOPHAGOGASTRODUODENOSCOPY (EGD);  Surgeon: Danie Binder, MD;  Location: [facility];  Service: Endoscopy;  Laterality: N/A;  1130   . EYE SURGERY    . PROLAPSED UTERINE FIBROID LIGATION     outcomed with  rectocele & cystocele  . SAVORY DILATION N/A 03/21/2015   Procedure: SAVORY DILATION;  Surgeon: Danie Binder, MD;  Location: [facility];  Service: Endoscopy;  Laterality: N/A;  . SHOULDER SURGERY     left  . TOTAL KNEE ARTHROPLASTY Right 07/27/2014   Procedure: RIGHT TOTAL KNEE ARTHROPLASTY;  Surgeon: Johnny Bridge, MD;  Location: [facility];  Service: Orthopedics;  Laterality: Right;  . TOTAL SHOULDER ARTHROPLASTY Right 06/21/2015   Procedure: RIGHT TOTAL SHOULDER ARTHROPLASTY;  Surgeon: Marchia Bond, MD;   Location: [facility];  Service: Orthopedics;  Laterality: Right;  . TOTAL THYROIDECTOMY  2010     OB History   None      Home Medications    Prior to Admission medications   Medication Sig Start Date End Date Taking? Authorizing Provider  albuterol (PROVENTIL HFA) 108 (90 BASE) MCG/ACT inhaler Inhale 1-2 puffs into the lungs every 6 (six) hours as needed for wheezing or shortness of breath. Wheezing, Asthma Symptoms   Yes [provider]  aspirin 81 MG tablet Take 81 mg by mouth daily.   Yes [provider]  calcium-vitamin D (OSCAL WITH D) 500-200 MG-UNIT per tablet Take 1 tablet by mouth daily.    Yes [provider]  cephALEXin (KEFLEX) 500 MG capsule Take 1 capsule by mouth 2 (two) times daily. 09/26/18  Yes [provider]  diazepam (VALIUM) 5 MG tablet Take one tablet by mouth at bedtime as needed for sleep Patient taking differently: Take 2.5-5 mg by mouth at bedtime as needed (sleep).  08/04/14  Yes Estill Dooms, MD  Glucosamine HCl (GLUCOSAMINE PO) Take 1 tablet by mouth 2 (two) times daily.   Yes [provider]  ipratropium (ATROVENT) 0.03 % nasal spray Place 2 sprays into both nostrils daily as needed (allergies stuffy nose).  04/25/17  Yes [provider]  levocetirizine (XYZAL) 5 MG tablet Take 5 mg by mouth every evening.     Yes [provider]  levothyroxine (SYNTHROID, LEVOTHROID) 75 MCG tablet Take 1 tablet (75 mcg total) by mouth daily. 09/22/18  Yes Elayne Snare, MD  montelukast (SINGULAIR) 10 MG tablet Take 10 mg by mouth at bedtime.   Yes [provider]  omeprazole (PRILOSEC) 20 MG capsule 1 PO 30 mins prior to breakfast and supper 12/05/17  Yes Walden Field A, NP  pravastatin (PRAVACHOL) 80 MG tablet Take 1 tablet (80 mg total) by mouth every evening. 03/03/18  Yes Branch, Alphonse Guild, MD  senna (SENOKOT) 8.6 MG tablet Take 1 tablet by mouth daily.   Yes [provider]  vitamin B-12  (CYANOCOBALAMIN) 1000 MCG tablet Take 1,000 mcg by mouth every morning.   Yes [provider]  vitamin C (ASCORBIC ACID) 500 MG tablet Take 500 mg by mouth daily.   Yes [provider]  vitamin E 400 UNIT capsule Take 400 Units by mouth at bedtime.    Yes [provider]  EPINEPHrine 0.3 mg/0.3 mL IJ SOAJ injection Inject into the muscle as needed for anaphylaxis. 08/13/18   [provider]    Family History Family History  Problem Relation Age of Onset  . Heart disease Mother   . Gallbladder disease Mother   . Heart failure Mother   . Breast cancer Sister        30 years ago  . Heart attack Father   . Colon cancer Neg Hx   . Stroke Neg Hx     Social History Social  History   Tobacco Use  . Smoking status: Former Smoker    Packs/day: 0.80    Years: 20.00    Pack years: 16.00    Types: Cigarettes    Last attempt to quit: 12/31/1978    Years since quitting: 39.7  . Smokeless tobacco: Never Used  Substance Use Topics  . Alcohol use: No  . Drug use: No     Allergies   Celecoxib and Dexilant [dexlansoprazole]   Review of Systems Review of Systems  Constitutional: Negative for chills and fever.  HENT: Negative for congestion and sore throat.   Eyes: Negative.   Respiratory: Negative for cough, chest tightness and shortness of breath.   Cardiovascular: Positive for chest pain. Negative for palpitations.  Gastrointestinal: Negative for abdominal pain, nausea and vomiting.  Genitourinary: Negative.   Musculoskeletal: Negative for arthralgias, joint swelling and neck pain.  Skin: Negative.  Negative for rash and wound.  Neurological: Negative for dizziness, weakness, light-headedness, numbness and headaches.  Psychiatric/Behavioral: Negative.      Physical Exam Updated Vital Signs BP (!) 141/75   Pulse 95   Temp 97.8 F (36.6 C) (Oral)   Resp 18   Ht 5\' 2"  (1.575 m)   Wt 51.3 kg   SpO2 94%   BMI 20.67 kg/m   Physical Exam    Constitutional: She appears well-developed and well-nourished.  Cachectic, appears stated age  HENT:  Head: Normocephalic and atraumatic.  Mouth/Throat: Mucous membranes are not pale and dry.  Eyes: Conjunctivae are normal.  Neck: Normal range of motion.  Cardiovascular: Normal rate, regular rhythm, normal heart sounds and intact distal pulses.  Pulses:      Radial pulses are 2+ on the right side, and 2+ on the left side.       Dorsalis pedis pulses are 2+ on the right side, and 2+ on the left side.  Pulmonary/Chest: Breath sounds normal. No respiratory distress. She has no wheezes.  Abdominal: Soft. Bowel sounds are normal. There is no tenderness.  Poor effort, no rhonchi, wheeze, rales  Musculoskeletal: Normal range of motion.  Neurological: She is alert.  Skin: Skin is warm and dry.  Psychiatric: She has a normal mood and affect.  Nursing note and vitals reviewed.    ED Treatments / Results  Labs (all labs ordered are listed, but only abnormal results are displayed) Labs Reviewed  BASIC METABOLIC PANEL - Abnormal; Notable for the following components:      Result Value   Potassium 3.2 (*)    Glucose, Bld 126 (*)    GFR calc non Af Amer 59 (*)    All other components within normal limits  CBC - Abnormal; Notable for the following components:   WBC 16.2 (*)    RBC 3.76 (*)    Hemoglobin 10.9 (*)    HCT 34.0 (*)    All other components within normal limits  HEPATIC FUNCTION PANEL  LIPASE, BLOOD  I-STAT TROPONIN, ED  I-STAT TROPONIN, ED  CBG MONITORING, ED    EKG EKG Interpretation  Date/Time:  Tuesday September 30 2018 10:56:14 EDT Ventricular Rate:  76 PR Interval:    QRS Duration: 128 QT Interval:  423 QTC Calculation: 476 R Axis:   87 Text Interpretation:  Sinus rhythm Probable left atrial enlargement Right bundle branch block No STEMI. No change since arrival.  Confirmed by Nanda Quinton [phone]) on 09/30/2018 10:58:31 AM   Radiology Ct Angio Chest Pe W  And/or Wo Contrast  Result Date: 09/30/2018  CLINICAL DATA:  Epigastric pain. Bilateral neck pain. Mid chest pain. Symptoms today history of hypertension and a thyroidectomy. History also mentions nausea and vomiting. EXAM: CT ANGIOGRAPHY CHEST CT ABDOMEN AND PELVIS WITH CONTRAST TECHNIQUE: Multidetector CT imaging of the chest was performed using the standard protocol during bolus administration of intravenous contrast. Multiplanar CT image reconstructions and MIPs were obtained to evaluate the vascular anatomy. Multidetector CT imaging of the abdomen and pelvis was performed using the standard protocol during bolus administration of intravenous contrast. CONTRAST:  27mL ISOVUE-300 IOPAMIDOL (ISOVUE-300) INJECTION 61%, 18mL ISOVUE-370 IOPAMIDOL (ISOVUE-370) INJECTION 76% COMPARISON:  None. FINDINGS: CTA CHEST FINDINGS Cardiovascular: Satisfactory opacification of the pulmonary arteries to the segmental level. No evidence of pulmonary embolism. Normal heart size. No pericardial effusion. No coronary artery calcifications. Great vessels are normal in caliber. No aortic dissection. Atherosclerotic changes noted along the aortic arch and descending thoracic aorta. Aortic arch branch vessels are widely patent. Mediastinum/Nodes: No neck base mediastinal or hilar masses or pathologically enlarged lymph nodes. Trachea is widely patent. Esophagus is unremarkable. Lungs/Pleura: Right greater than left pleuroparenchymal opacities consistent with scarring. Linear and reticular opacities noted in the lower lobes, left upper lobe lingula and right middle lobe, consistent with a combination of scarring and subsegmental atelectasis. Subtle peribronchovascular hazy opacities noted in the lower lobes, likely atelectasis or scarring. Infection is possible but felt less likely. There is no evidence of pulmonary edema. No pleural effusion or pneumothorax. Musculoskeletal: No fracture or acute finding. No osteoblastic or osteolytic  lesions. Partly imaged bilateral shoulder prostheses appear well seated and aligned. Review of the MIP images confirms the above findings. CT ABDOMEN and PELVIS FINDINGS Hepatobiliary: No focal liver abnormality is seen. No gallstones, gallbladder wall thickening, or biliary dilatation. Pancreas: Unremarkable. No pancreatic ductal dilatation or surrounding inflammatory changes. Spleen: Normal in size without focal abnormality. Adrenals/Urinary Tract: No adrenal masses. Kidneys normal size, orientation and position with symmetric enhancement and excretion. There is left renal cortical thinning. Subcentimeter low-density masses arise from the upper pole and midpole the left kidney consistent with cysts. No other renal masses, no stones and no hydronephrosis. Ureters are normal course and in caliber.  No ureteral stones. Bladder is collapsed. Stomach/Bowel: Stomach is unremarkable. Small bowel and colon are normal in caliber. No wall thickening or inflammatory changes. Appendix not visualized. No evidence of appendicitis. Vascular/Lymphatic: Aortic atherosclerosis. No enlarged abdominal or pelvic lymph nodes. Reproductive: Status post hysterectomy. No adnexal masses. Other: No abdominal wall hernia or abnormality. No abdominopelvic ascites. Musculoskeletal: No fracture or acute finding. No osteoblastic or osteolytic lesions. Review of the MIP images confirms the above findings. IMPRESSION: CHEST CTA 1. No evidence of a pulmonary embolism. 2. Right greater than left pleuroparenchymal opacity that is most consistent with scarring. 3. Subtle peribronchovascular hazy opacities in the lower lobes likely scarring or atelectasis. Infection is possible and should be considered in the proper clinical setting. No other evidence of acute abnormality in the chest. 4. Aortic atherosclerosis. ABDOMEN AND PELVIS CT 1. No acute findings within the abdomen or pelvis. No findings to account for abdominal pain. 2. Aortic atherosclerosis.  Electronically Signed   By: Lajean Manes M.D.   On: 09/30/2018 15:18   Ct Abdomen Pelvis W Contrast  Result Date: 09/30/2018 CLINICAL DATA:  Epigastric pain. Bilateral neck pain. Mid chest pain. Symptoms today history of hypertension and a thyroidectomy. History also mentions nausea and vomiting. EXAM: CT ANGIOGRAPHY CHEST CT ABDOMEN AND PELVIS WITH CONTRAST TECHNIQUE: Multidetector CT imaging of the  chest was performed using the standard protocol during bolus administration of intravenous contrast. Multiplanar CT image reconstructions and MIPs were obtained to evaluate the vascular anatomy. Multidetector CT imaging of the abdomen and pelvis was performed using the standard protocol during bolus administration of intravenous contrast. CONTRAST:  84mL ISOVUE-300 IOPAMIDOL (ISOVUE-300) INJECTION 61%, 149mL ISOVUE-370 IOPAMIDOL (ISOVUE-370) INJECTION 76% COMPARISON:  None. FINDINGS: CTA CHEST FINDINGS Cardiovascular: Satisfactory opacification of the pulmonary arteries to the segmental level. No evidence of pulmonary embolism. Normal heart size. No pericardial effusion. No coronary artery calcifications. Great vessels are normal in caliber. No aortic dissection. Atherosclerotic changes noted along the aortic arch and descending thoracic aorta. Aortic arch branch vessels are widely patent. Mediastinum/Nodes: No neck base mediastinal or hilar masses or pathologically enlarged lymph nodes. Trachea is widely patent. Esophagus is unremarkable. Lungs/Pleura: Right greater than left pleuroparenchymal opacities consistent with scarring. Linear and reticular opacities noted in the lower lobes, left upper lobe lingula and right middle lobe, consistent with a combination of scarring and subsegmental atelectasis. Subtle peribronchovascular hazy opacities noted in the lower lobes, likely atelectasis or scarring. Infection is possible but felt less likely. There is no evidence of pulmonary edema. No pleural effusion or  pneumothorax. Musculoskeletal: No fracture or acute finding. No osteoblastic or osteolytic lesions. Partly imaged bilateral shoulder prostheses appear well seated and aligned. Review of the MIP images confirms the above findings. CT ABDOMEN and PELVIS FINDINGS Hepatobiliary: No focal liver abnormality is seen. No gallstones, gallbladder wall thickening, or biliary dilatation. Pancreas: Unremarkable. No pancreatic ductal dilatation or surrounding inflammatory changes. Spleen: Normal in size without focal abnormality. Adrenals/Urinary Tract: No adrenal masses. Kidneys normal size, orientation and position with symmetric enhancement and excretion. There is left renal cortical thinning. Subcentimeter low-density masses arise from the upper pole and midpole the left kidney consistent with cysts. No other renal masses, no stones and no hydronephrosis. Ureters are normal course and in caliber.  No ureteral stones. Bladder is collapsed. Stomach/Bowel: Stomach is unremarkable. Small bowel and colon are normal in caliber. No wall thickening or inflammatory changes. Appendix not visualized. No evidence of appendicitis. Vascular/Lymphatic: Aortic atherosclerosis. No enlarged abdominal or pelvic lymph nodes. Reproductive: Status post hysterectomy. No adnexal masses. Other: No abdominal wall hernia or abnormality. No abdominopelvic ascites. Musculoskeletal: No fracture or acute finding. No osteoblastic or osteolytic lesions. Review of the MIP images confirms the above findings. IMPRESSION: CHEST CTA 1. No evidence of a pulmonary embolism. 2. Right greater than left pleuroparenchymal opacity that is most consistent with scarring. 3. Subtle peribronchovascular hazy opacities in the lower lobes likely scarring or atelectasis. Infection is possible and should be considered in the proper clinical setting. No other evidence of acute abnormality in the chest. 4. Aortic atherosclerosis. ABDOMEN AND PELVIS CT 1. No acute findings within  the abdomen or pelvis. No findings to account for abdominal pain. 2. Aortic atherosclerosis. Electronically Signed   By: Lajean Manes M.D.   On: 09/30/2018 15:18   Dg Chest Portable 1 View  Result Date: 09/30/2018 CLINICAL DATA:  Chest pain and left neck pain. EXAM: PORTABLE CHEST 1 VIEW COMPARISON:  02/04/2015 FINDINGS: The cardiac silhouette is accentuated by AP technique and shallower lung inflation compared to the prior study. Aortic atherosclerosis is noted. Mild biapical pleural thickening is noted. No airspace consolidation, edema, sizable pleural effusion, or pneumothorax is identified. Bilateral shoulder arthroplasties are noted. IMPRESSION: No active disease. Electronically Signed   By: Logan Bores M.D.   On: 09/30/2018 10:55  Procedures Procedures (including critical care time)  Medications Ordered in ED Medications  aspirin chewable tablet 324 mg (324 mg Oral Given 09/30/18 1032)  ondansetron (ZOFRAN) injection 4 mg (4 mg Intravenous Given 09/30/18 1038)  nitroGLYCERIN (NITROSTAT) SL tablet 0.4 mg (0.4 mg Sublingual Given 09/30/18 1037)  sodium chloride 0.9 % bolus 500 mL (0 mLs Intravenous Stopped 09/30/18 1254)  potassium chloride SA (K-DUR,KLOR-CON) CR tablet 20 mEq (20 mEq Oral Given 09/30/18 1146)  fentaNYL (SUBLIMAZE) injection 50 mcg (50 mcg Intravenous Given 09/30/18 1220)  iopamidol (ISOVUE-300) 61 % injection 30 mL (30 mLs Oral Contrast Given 09/30/18 1233)  iopamidol (ISOVUE-370) 76 % injection 100 mL (100 mLs Intravenous Contrast Given 09/30/18 1437)  fentaNYL (SUBLIMAZE) injection 50 mcg (50 mcg Intravenous Given 09/30/18 1504)     Initial Impression / Assessment and Plan / ED Course  I have reviewed the triage vital signs and the nursing notes.  Pertinent labs & imaging results that were available during my care of the patient were reviewed by me and considered in my medical decision making (see chart for details).     Labs, cxr and ekg unremarkable.  CT angio  chest ordered to rule out PE and other potential source of cp.  At re-exam,  She did endorse mild ttp RUQ, abd CT added as well.   Ct imaging and labs reviewed and discussed with patient and dg at bedside.  Chest pain with unclear etiology, CT imaging suggesting atelectasis versus possible pulmonary infection.  With her elevated white blood cell count will add Rocephin at this time.  Patient will need admission given her age, risk factors for coronary disease.  Final Clinical Impressions(s) / ED Diagnoses   Final diagnoses:  Chest pain, unspecified type  Nausea  Other elevated white blood cell (WBC) count  Hypokalemia    ED Discharge Orders    None       Landis Martins 09/30/18 1622    LongWonda Olds, MD 09/30/18 1940

## 2018-09-30 NOTE — ED Notes (Addendum)
CRITICAL VALUE ALERT  Critical Value:  Lactic Acid 2.0  Date & Time Notied:  09/30/18 2207  Provider Notified: Tylene Fantasia, NP via amion  Orders Received/Actions taken: n/a

## 2018-09-30 NOTE — ED Notes (Addendum)
Paged Dr. Denton Brick about critical 2153

## 2018-09-30 NOTE — H&P (Signed)
History and Physical  Christine Leblanc QIO:[id number] DOB: [birth year] DOA: 09/30/2018   PCP: Celene Squibb, MD   Patient coming from: Home  Chief Complaint: chest pain, neck pain  HPI:  Christine Leblanc is a 84 y.o. female with medical history of hypertension, hyperlipidemia, seizure, GERD, hypothyroidism, TIA, dysphagia presenting with chest pain and neck pain that began around 7:30 AM on 09/30/2018.  Patient was making her way back to the bedroom from her bathroom when she had sharp constant substernal pain with associated neck pain that was moderate to severe.  The patient denied any shortness of breath, nausea, vomiting, dizziness.  In the past week prior to admission, the patient had complained of some generalized weakness.  She denied any headache, visual disturbance, focal extremity weakness, coughing, hemoptysis, dysuria, hematuria, abdominal pain, vomiting, or diarrhea.  There is no hematochezia or melena.  She feels like she has had some subjective fevers and chills.  She denies any recent injuries or falls.  She denies any new medications.  In the emergency department, the patient finally received relief of her chest pain and neck pain after fentanyl IV. In the emergency department, the patient was afebrile hemodynamically stable saturating 90-95% on room air.  The patient initially had soft blood pressure of the upper 80s but improved significantly after 500 cc of fluid.  BMP showed a potassium 3.2 with normal LFTs.  WBC was 16.2 with hemoglobin at her baseline.  Point-of-care troponin was negative. CT angiogram chest was negative for pulmonary embolus but showed R>L pleural parenchymal opacity consistent with scarring.  There is also subtle peribronchial wall thickening and hazy opacities in the bilateral lower lobes.  Patient had one episode of nausea and vomiting in the emergency department.  CT the abdomen and pelvis was unremarkable.  Lipase 35  Assessment/Plan: Atypical chest  pain -Suspect this may be related to her dysphasia/GERD -Cycle troponins -GI cocktail -Echocardiogram -Placed on telemetry  Generalized weakness/leukocytosis -Blood cultures x2 sets -Urine urine culture -Lactic acid -Procalcitonin -CT angiogram chest as described above, neg for PE -Will not start antibiotics at this time as patient is hemodynamically stable and afebrile -IV fluids -Serum L24 -Folic acid -PT evaluation -Check magnesium -09/11/2018 TSH 2.19  Neck pain -Cervical spine x-rays  Hypokalemia -Replete -Check magnesium  Hypothyroidism -09/11/2018 TSH 2.19 -Continue Synthroid  COPD  -The patient has 30-pack-year history -Quit smoking 30 years ago -Stable on room air  Constipation -Continue home dose of senna  History of TIA -Continue aspirin  Elevated blood pressure -Hydralazine as needed SBP >180       Past Medical History:  Diagnosis Date  . Anemia   . Asthma   . Cancer ([place])    skin Ca- ? basal cell   . Chronic back pain   . Chronic neck pain   . Degenerative joint disease    Left shoulder; cervical spine, knees & hands   . Gastroesophageal reflux disease    Hiatal hernia; distal esophageal web requiring dilatation; gastric polyps; gastritis; refuses colonoscopy  . GERD (gastroesophageal reflux disease)   . History of stress test 1990's   stress test done under the care of Dr. Lattie Haw & Dr. Gwenlyn Found, now being followed by Dr. Harl Bowie- in [place] , recently seen & told to f/U in one yr.   . Hyperlipidemia 04/27/2011  . Hypertension   . Hypothyroidism   . Lymphocytic thyroiditis   . Multiple thyroid nodules 2010   Adenomatous; thyroidectomy in 2010  .  Osteoarthritis of right knee 07/27/2014  . Pneumonia    hosp. for pneumonia- long time ago   . Primary localized osteoarthrosis of right shoulder 06/21/2015  . Seizures ([place])    yes- as a child- & into adult years, states she took med. for them at one time, stopped at 32 yrs. of age   .  Syncope and collapse 2007   Possible CVA in 2012 with left lower extremity weakness; refused hospitalization; CT-Atrophy and chronic microvascular ischemic change.    Past Surgical History:  Procedure Laterality Date  . ABDOMINAL HYSTERECTOMY     fibroids  . CATARACT EXTRACTION     Bilateral; redo surgery on the right for incomplete primary procedure  . COLONOSCOPY  Remote  . ESOPHAGOGASTRODUODENOSCOPY  12/10   Dr. 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 ESOPHAGOGASTRODUODENOSCOPY N/A 03/21/2015   Procedure: ESOPHAGOGASTRODUODENOSCOPY (EGD);  Surgeon: Danie Binder, MD;  [facility];  Service: Endoscopy;  Laterality: N/A;  1130   . EYE SURGERY    . PROLAPSED UTERINE FIBROID LIGATION     outcomed with rectocele & cystocele  . SAVORY DILATION N/A 03/21/2015   Procedure: SAVORY DILATION;  Surgeon: Danie Binder, MD;  [facility];  Service: Endoscopy;  Laterality: N/A;  . SHOULDER SURGERY     left  . TOTAL KNEE ARTHROPLASTY Right 07/27/2014   Procedure: RIGHT TOTAL KNEE ARTHROPLASTY;  Surgeon: Johnny Bridge, MD;  Location: [facility];  Service: Orthopedics;  Laterality: Right;  . TOTAL SHOULDER ARTHROPLASTY Right 06/21/2015   Procedure: RIGHT TOTAL SHOULDER ARTHROPLASTY;  Surgeon: Marchia Bond, MD;  Location: [facility];  Service: Orthopedics;  Laterality: Right;  . TOTAL THYROIDECTOMY  2010   Social History:  reports that she quit smoking about 39 years ago. Her smoking use included cigarettes. She has a 16.00 pack-year smoking history. She has never used smokeless tobacco. She reports that she does not drink alcohol or use drugs.   Family History  Problem Relation Age of Onset  . Heart disease Mother   . Gallbladder disease Mother   . Heart failure Mother   . Breast cancer Sister        30 years ago  . Heart attack Father   . Colon cancer Neg Hx   . Stroke Neg Hx      Allergies  Allergen Reactions  .  Celecoxib Shortness Of Breath  . Dexilant [Dexlansoprazole] Anaphylaxis    abd pain     Prior to Admission medications   Medication Sig Start Date End Date Taking? Authorizing Provider  albuterol (PROVENTIL HFA) 108 (90 BASE) MCG/ACT inhaler Inhale 1-2 puffs into the lungs every 6 (six) hours as needed for wheezing or shortness of breath. Wheezing, Asthma Symptoms   Yes [provider]  aspirin 81 MG tablet Take 81 mg by mouth daily.   Yes [provider]  calcium-vitamin D (OSCAL WITH D) 500-200 MG-UNIT per tablet Take 1 tablet by mouth daily.    Yes [provider]  cephALEXin (KEFLEX) 500 MG capsule Take 1 capsule by mouth 2 (two) times daily. 09/26/18  Yes [provider]  diazepam (VALIUM) 5 MG tablet Take one tablet by mouth at bedtime as needed for sleep Patient taking differently: Take 2.5-5 mg by mouth at bedtime as needed (sleep).  08/04/14  Yes Estill Dooms, MD  Glucosamine HCl (GLUCOSAMINE PO) Take 1 tablet by mouth 2 (two) times daily.   Yes [provider]  ipratropium (ATROVENT) 0.03 % nasal spray Place 2 sprays into both nostrils daily as needed (allergies stuffy nose).  04/25/17  Yes [provider]  levocetirizine (XYZAL) 5 MG tablet Take 5 mg by mouth every evening.     Yes [provider]  levothyroxine (SYNTHROID, LEVOTHROID) 75 MCG tablet Take 1 tablet (75 mcg total) by mouth daily. 09/22/18  Yes Elayne Snare, MD  montelukast (SINGULAIR) 10 MG tablet Take 10 mg by mouth at bedtime.   Yes [provider]  omeprazole (PRILOSEC) 20 MG capsule 1 PO 30 mins prior to breakfast and supper 12/05/17  Yes Walden Field A, NP  pravastatin (PRAVACHOL) 80 MG tablet Take 1 tablet (80 mg total) by mouth every evening. 03/03/18  Yes Branch, Alphonse Guild, MD  senna (SENOKOT) 8.6 MG tablet Take 1 tablet by mouth daily.   Yes [provider]  vitamin B-12 (CYANOCOBALAMIN) 1000 MCG tablet Take 1,000 mcg by mouth every  morning.   Yes [provider]  vitamin C (ASCORBIC ACID) 500 MG tablet Take 500 mg by mouth daily.   Yes [provider]  vitamin E 400 UNIT capsule Take 400 Units by mouth at bedtime.    Yes [provider]  EPINEPHrine 0.3 mg/0.3 mL IJ SOAJ injection Inject into the muscle as needed for anaphylaxis. 08/13/18   [provider]    Review of Systems:  Constitutional:  No weight loss, night sweats, Head&Eyes: No headache.  No vision loss.  No eye pain or scotoma ENT:  No Difficulty swallowing,Tooth/dental problems,Sore throat,  No ear ache, post nasal drip,  Cardio-vascular:  No  Orthopnea, PND, swelling in lower extremities,  dizziness, palpitations  GI:  No   diarrhea, loss of appetite, hematochezia, melena, heartburn, indigestion, Resp:  . No cough. No coughing up of blood .No wheezing.No chest wall deformity  Skin:  no rash or lesions.  GU:  no dysuria, change in color of urine, no urgency or frequency. No flank pain.  Musculoskeletal:  No joint pain or swelling. No decreased range of motion. No back pain.  Psych:  No change in mood or affect. No depression or anxiety. Neurologic: No headache, no dysesthesia, no focal weakness, no vision loss. No syncope  Physical Exam: Vitals:   09/30/18 1600 09/30/18 1615 09/30/18 1630 09/30/18 1645  BP: (!) 147/77 (!) 141/75 (!) 158/81 (!) 149/84  Pulse: 97 95  100  Resp: 18 18 19 18   Temp:      TempSrc:      SpO2: 95% 94% 95% 95%  Weight:      Height:       General:  A&O x 3, NAD, nontoxic, pleasant/cooperative Head/Eye: No conjunctival hemorrhage, no icterus, [place]/AT, No nystagmus ENT:  No icterus,  No thrush, good dentition, no pharyngeal exudate Neck:  No masses, no lymphadenpathy, no bruits CV:  RRR, no rub, no gallop, no S3 Lung: Bibasilar rales.  Diminished breath sounds bilateral.  No wheezing. Abdomen: soft/NT, +BS, nondistended, no peritoneal signs Ext: No cyanosis, No rashes, No  petechiae, No lymphangitis, No edema Neuro: CNII-XII intact, strength 4/5 in bilateral upper and lower extremities, no dysmetria  Labs on Admission:  Basic Metabolic Panel: Recent Labs  Lab 09/30/18 1031  NA 142  K 3.2*  CL 107  CO2 27  GLUCOSE 126*  BUN 20  CREATININE 0.86  CALCIUM 9.0   Liver Function Tests: Recent Labs  Lab 09/30/18 1052  AST 26  ALT 13  ALKPHOS  52  BILITOT 0.8  PROT 7.2  ALBUMIN 3.6   Recent Labs  Lab 09/30/18 1052  LIPASE 35   No results for input(s): AMMONIA in the last 168 hours. CBC: Recent Labs  Lab 09/30/18 1031  WBC 16.2*  HGB 10.9*  HCT 34.0*  MCV 90.4  PLT 204   Coagulation Profile: No results for input(s): INR, PROTIME in the last 168 hours. Cardiac Enzymes: No results for input(s): CKTOTAL, CKMB, CKMBINDEX, TROPONINI in the last 168 hours. BNP: Invalid input(s): POCBNP CBG: No results for input(s): GLUCAP in the last 168 hours. Urine analysis:    Component Value Date/Time   COLORURINE YELLOW 08/02/2012 1603   APPEARANCEUR CLEAR 08/02/2012 1603   LABSPEC 1.020 08/02/2012 1603   PHURINE 6.0 08/02/2012 1603   GLUCOSEU NEGATIVE 08/02/2012 1603   HGBUR NEGATIVE 08/02/2012 1603   BILIRUBINUR MODERATE (A) 08/02/2012 1603   KETONESUR NEGATIVE 08/02/2012 1603   PROTEINUR NEGATIVE 08/02/2012 1603   UROBILINOGEN 0.2 08/02/2012 1603   NITRITE NEGATIVE 08/02/2012 1603   LEUKOCYTESUR NEGATIVE 08/02/2012 1603   Sepsis Labs: @LABRCNTIP (procalcitonin:4,lacticidven:4) )No results found for this or any previous visit (from the past 240 hour(s)).   Radiological Exams on Admission: Ct Angio Chest Pe W And/or Wo Contrast  Result Date: 09/30/2018 CLINICAL DATA:  Epigastric pain. Bilateral neck pain. Mid chest pain. Symptoms today history of hypertension and a thyroidectomy. History also mentions nausea and vomiting. EXAM: CT ANGIOGRAPHY CHEST CT ABDOMEN AND PELVIS WITH CONTRAST TECHNIQUE: Multidetector CT imaging of the chest was  performed using the standard protocol during bolus administration of intravenous contrast. Multiplanar CT image reconstructions and MIPs were obtained to evaluate the vascular anatomy. Multidetector CT imaging of the abdomen and pelvis was performed using the standard protocol during bolus administration of intravenous contrast. CONTRAST:  72mL ISOVUE-300 IOPAMIDOL (ISOVUE-300) INJECTION 61%, 176mL ISOVUE-370 IOPAMIDOL (ISOVUE-370) INJECTION 76% COMPARISON:  None. FINDINGS: CTA CHEST FINDINGS Cardiovascular: Satisfactory opacification of the pulmonary arteries to the segmental level. No evidence of pulmonary embolism. Normal heart size. No pericardial effusion. No coronary artery calcifications. Great vessels are normal in caliber. No aortic dissection. Atherosclerotic changes noted along the aortic arch and descending thoracic aorta. Aortic arch branch vessels are widely patent. Mediastinum/Nodes: No neck base mediastinal or hilar masses or pathologically enlarged lymph nodes. Trachea is widely patent. Esophagus is unremarkable. Lungs/Pleura: Right greater than left pleuroparenchymal opacities consistent with scarring. Linear and reticular opacities noted in the lower lobes, left upper lobe lingula and right middle lobe, consistent with a combination of scarring and subsegmental atelectasis. Subtle peribronchovascular hazy opacities noted in the lower lobes, likely atelectasis or scarring. Infection is possible but felt less likely. There is no evidence of pulmonary edema. No pleural effusion or pneumothorax. Musculoskeletal: No fracture or acute finding. No osteoblastic or osteolytic lesions. Partly imaged bilateral shoulder prostheses appear well seated and aligned. Review of the MIP images confirms the above findings. CT ABDOMEN and PELVIS FINDINGS Hepatobiliary: No focal liver abnormality is seen. No gallstones, gallbladder wall thickening, or biliary dilatation. Pancreas: Unremarkable. No pancreatic ductal  dilatation or surrounding inflammatory changes. Spleen: Normal in size without focal abnormality. Adrenals/Urinary Tract: No adrenal masses. Kidneys normal size, orientation and position with symmetric enhancement and excretion. There is left renal cortical thinning. Subcentimeter low-density masses arise from the upper pole and midpole the left kidney consistent with cysts. No other renal masses, no stones and no hydronephrosis. Ureters are normal course and in caliber.  No ureteral stones. Bladder is collapsed. Stomach/Bowel: Stomach is  unremarkable. Small bowel and colon are normal in caliber. No wall thickening or inflammatory changes. Appendix not visualized. No evidence of appendicitis. Vascular/Lymphatic: Aortic atherosclerosis. No enlarged abdominal or pelvic lymph nodes. Reproductive: Status post hysterectomy. No adnexal masses. Other: No abdominal wall hernia or abnormality. No abdominopelvic ascites. Musculoskeletal: No fracture or acute finding. No osteoblastic or osteolytic lesions. Review of the MIP images confirms the above findings. IMPRESSION: CHEST CTA 1. No evidence of a pulmonary embolism. 2. Right greater than left pleuroparenchymal opacity that is most consistent with scarring. 3. Subtle peribronchovascular hazy opacities in the lower lobes likely scarring or atelectasis. Infection is possible and should be considered in the proper clinical setting. No other evidence of acute abnormality in the chest. 4. Aortic atherosclerosis. ABDOMEN AND PELVIS CT 1. No acute findings within the abdomen or pelvis. No findings to account for abdominal pain. 2. Aortic atherosclerosis. Electronically Signed   By: Lajean Manes M.D.   On: 09/30/2018 15:18   Ct Abdomen Pelvis W Contrast  Result Date: 09/30/2018 CLINICAL DATA:  Epigastric pain. Bilateral neck pain. Mid chest pain. Symptoms today history of hypertension and a thyroidectomy. History also mentions nausea and vomiting. EXAM: CT ANGIOGRAPHY CHEST CT  ABDOMEN AND PELVIS WITH CONTRAST TECHNIQUE: Multidetector CT imaging of the chest was performed using the standard protocol during bolus administration of intravenous contrast. Multiplanar CT image reconstructions and MIPs were obtained to evaluate the vascular anatomy. Multidetector CT imaging of the abdomen and pelvis was performed using the standard protocol during bolus administration of intravenous contrast. CONTRAST:  71mL ISOVUE-300 IOPAMIDOL (ISOVUE-300) INJECTION 61%, 156mL ISOVUE-370 IOPAMIDOL (ISOVUE-370) INJECTION 76% COMPARISON:  None. FINDINGS: CTA CHEST FINDINGS Cardiovascular: Satisfactory opacification of the pulmonary arteries to the segmental level. No evidence of pulmonary embolism. Normal heart size. No pericardial effusion. No coronary artery calcifications. Great vessels are normal in caliber. No aortic dissection. Atherosclerotic changes noted along the aortic arch and descending thoracic aorta. Aortic arch branch vessels are widely patent. Mediastinum/Nodes: No neck base mediastinal or hilar masses or pathologically enlarged lymph nodes. Trachea is widely patent. Esophagus is unremarkable. Lungs/Pleura: Right greater than left pleuroparenchymal opacities consistent with scarring. Linear and reticular opacities noted in the lower lobes, left upper lobe lingula and right middle lobe, consistent with a combination of scarring and subsegmental atelectasis. Subtle peribronchovascular hazy opacities noted in the lower lobes, likely atelectasis or scarring. Infection is possible but felt less likely. There is no evidence of pulmonary edema. No pleural effusion or pneumothorax. Musculoskeletal: No fracture or acute finding. No osteoblastic or osteolytic lesions. Partly imaged bilateral shoulder prostheses appear well seated and aligned. Review of the MIP images confirms the above findings. CT ABDOMEN and PELVIS FINDINGS Hepatobiliary: No focal liver abnormality is seen. No gallstones, gallbladder  wall thickening, or biliary dilatation. Pancreas: Unremarkable. No pancreatic ductal dilatation or surrounding inflammatory changes. Spleen: Normal in size without focal abnormality. Adrenals/Urinary Tract: No adrenal masses. Kidneys normal size, orientation and position with symmetric enhancement and excretion. There is left renal cortical thinning. Subcentimeter low-density masses arise from the upper pole and midpole the left kidney consistent with cysts. No other renal masses, no stones and no hydronephrosis. Ureters are normal course and in caliber.  No ureteral stones. Bladder is collapsed. Stomach/Bowel: Stomach is unremarkable. Small bowel and colon are normal in caliber. No wall thickening or inflammatory changes. Appendix not visualized. No evidence of appendicitis. Vascular/Lymphatic: Aortic atherosclerosis. No enlarged abdominal or pelvic lymph nodes. Reproductive: Status post hysterectomy. No adnexal masses. Other: No  abdominal wall hernia or abnormality. No abdominopelvic ascites. Musculoskeletal: No fracture or acute finding. No osteoblastic or osteolytic lesions. Review of the MIP images confirms the above findings. IMPRESSION: CHEST CTA 1. No evidence of a pulmonary embolism. 2. Right greater than left pleuroparenchymal opacity that is most consistent with scarring. 3. Subtle peribronchovascular hazy opacities in the lower lobes likely scarring or atelectasis. Infection is possible and should be considered in the proper clinical setting. No other evidence of acute abnormality in the chest. 4. Aortic atherosclerosis. ABDOMEN AND PELVIS CT 1. No acute findings within the abdomen or pelvis. No findings to account for abdominal pain. 2. Aortic atherosclerosis. Electronically Signed   By: Lajean Manes M.D.   On: 09/30/2018 15:18   Dg Chest Portable 1 View  Result Date: 09/30/2018 CLINICAL DATA:  Chest pain and left neck pain. EXAM: PORTABLE CHEST 1 VIEW COMPARISON:  02/04/2015 FINDINGS: The cardiac  silhouette is accentuated by AP technique and shallower lung inflation compared to the prior study. Aortic atherosclerosis is noted. Mild biapical pleural thickening is noted. No airspace consolidation, edema, sizable pleural effusion, or pneumothorax is identified. Bilateral shoulder arthroplasties are noted. IMPRESSION: No active disease. Electronically Signed   By: Logan Bores M.D.   On: 09/30/2018 10:55    EKG: Independently reviewed. Sinus RBBB, unchanged    Time spent:60 minutes Code Status:   FULL Family Communication:  Daughter updated at bedside Disposition Plan: expect 1-2 day hospitalization Consults called: none DVT Prophylaxis: [place] Lovenox  Orson Eva, DO  [facility] Pager [phone]  If 7PM-7AM, please contact night-coverage [email or web address] Password [email or web address] 09/30/2018, 5:00 PM

## 2018-09-30 NOTE — ED Notes (Signed)
Pt refused to have her glucose checked. Pt states "I am not diabetic and EMS took it already".

## 2018-10-01 ENCOUNTER — Observation Stay (HOSPITAL_COMMUNITY): Payer: Medicare Other

## 2018-10-01 DIAGNOSIS — R1319 Other dysphagia: Secondary | ICD-10-CM | POA: Diagnosis not present

## 2018-10-01 DIAGNOSIS — R531 Weakness: Secondary | ICD-10-CM

## 2018-10-01 DIAGNOSIS — E876 Hypokalemia: Secondary | ICD-10-CM

## 2018-10-01 DIAGNOSIS — M542 Cervicalgia: Secondary | ICD-10-CM

## 2018-10-01 DIAGNOSIS — R079 Chest pain, unspecified: Secondary | ICD-10-CM

## 2018-10-01 DIAGNOSIS — R1310 Dysphagia, unspecified: Secondary | ICD-10-CM | POA: Diagnosis not present

## 2018-10-01 LAB — RESPIRATORY PANEL BY PCR
Adenovirus: NOT DETECTED
BORDETELLA PERTUSSIS-RVPCR: NOT DETECTED
CORONAVIRUS 229E-RVPPCR: NOT DETECTED
CORONAVIRUS HKU1-RVPPCR: NOT DETECTED
CORONAVIRUS NL63-RVPPCR: NOT DETECTED
CORONAVIRUS OC43-RVPPCR: NOT DETECTED
Chlamydophila pneumoniae: NOT DETECTED
Influenza A: NOT DETECTED
Influenza B: NOT DETECTED
METAPNEUMOVIRUS-RVPPCR: NOT DETECTED
Mycoplasma pneumoniae: NOT DETECTED
PARAINFLUENZA VIRUS 1-RVPPCR: NOT DETECTED
PARAINFLUENZA VIRUS 2-RVPPCR: NOT DETECTED
PARAINFLUENZA VIRUS 3-RVPPCR: NOT DETECTED
Parainfluenza Virus 4: NOT DETECTED
RHINOVIRUS / ENTEROVIRUS - RVPPCR: NOT DETECTED
Respiratory Syncytial Virus: NOT DETECTED

## 2018-10-01 LAB — BASIC METABOLIC PANEL
Anion gap: 5 (ref 5–15)
BUN: 20 mg/dL (ref 8–23)
CALCIUM: 8.7 mg/dL — AB (ref 8.9–10.3)
CHLORIDE: 108 mmol/L (ref 98–111)
CO2: 26 mmol/L (ref 22–32)
CREATININE: 0.85 mg/dL (ref 0.44–1.00)
GFR calc non Af Amer: 60 mL/min — ABNORMAL LOW (ref >60)
Glucose, Bld: 137 mg/dL — ABNORMAL HIGH (ref 70–99)
Potassium: 3.8 mmol/L (ref 3.5–5.1)
Sodium: 139 mmol/L (ref 135–145)

## 2018-10-01 LAB — URINALYSIS, COMPLETE (UACMP) WITH MICROSCOPIC
Bilirubin Urine: NEGATIVE
GLUCOSE, UA: 50 mg/dL — AB
Ketones, ur: NEGATIVE mg/dL
Leukocytes, UA: NEGATIVE
NITRITE: NEGATIVE
PROTEIN: 30 mg/dL — AB
pH: 5 (ref 5.0–8.0)

## 2018-10-01 LAB — CBC WITH DIFFERENTIAL/PLATELET
BASOS ABS: 0 10*3/uL (ref 0.0–0.1)
Basophils Relative: 0 %
EOS ABS: 0 10*3/uL (ref 0.0–0.7)
Eosinophils Relative: 0 %
HCT: 29.5 % — ABNORMAL LOW (ref 36.0–46.0)
HEMOGLOBIN: 9.6 g/dL — AB (ref 12.0–15.0)
LYMPHS ABS: 1.8 10*3/uL (ref 0.7–4.0)
LYMPHS PCT: 11 %
MCH: 29.6 pg (ref 26.0–34.0)
MCHC: 32.5 g/dL (ref 30.0–36.0)
MCV: 91 fL (ref 78.0–100.0)
Monocytes Absolute: 2.4 10*3/uL — ABNORMAL HIGH (ref 0.1–1.0)
Monocytes Relative: 15 %
NEUTROS PCT: 74 %
Neutro Abs: 12.3 10*3/uL — ABNORMAL HIGH (ref 1.7–7.7)
Platelets: 155 10*3/uL (ref 150–400)
RBC: 3.24 MIL/uL — AB (ref 3.87–5.11)
RDW: 14.6 % (ref 11.5–15.5)
WBC: 16.5 10*3/uL — AB (ref 4.0–10.5)

## 2018-10-01 LAB — MAGNESIUM: MAGNESIUM: 1.8 mg/dL (ref 1.7–2.4)

## 2018-10-01 LAB — TROPONIN I: Troponin I: <0.03 ng/mL (ref <0.03)

## 2018-10-01 NOTE — Evaluation (Signed)
Physical Therapy Evaluation Patient Details Name: Christine Leblanc MRN: [id number] DOB: [birth year] Today's Date: 10/01/2018   History of Present Illness  Christine Leblanc is a 84 y.o. female with medical history of hypertension, hyperlipidemia, seizure, GERD, hypothyroidism, TIA, dysphagia presenting with chest pain and neck pain that began around 7:30 AM on 09/30/2018.  Patient was making her way back to the bedroom from her bathroom when she had sharp constant substernal pain with associated neck pain that was moderate to severe.  The patient denied any shortness of breath, nausea, vomiting, dizziness.  In the past week prior to admission, the patient had complained of some generalized weakness.  She denied any headache, visual disturbance, focal extremity weakness, coughing, hemoptysis, dysuria, hematuria, abdominal pain, vomiting, or diarrhea.  There is no hematochezia or melena.  She feels like she has had some subjective fevers and chills.  She denies any recent injuries or falls.  She denies any new medications.  In the emergency department, the patient finally received relief of her chest pain and neck pain after fentanyl IV.    Clinical Impression  Patient functioning near baseline for functional mobility and gait, demonstrates slightly labored movement for sit to stands, transfers requiring use of RW for safety, able to transfer to commode in bathroom and later assisted back to bedside by visitors, ambulated in hallway without loss of balance, limited mostly due to c/o fatigue and tolerated sitting up in chair after therapy.  Patient functioning slightly below baseline, but will have adequate help at home.  Patient to be discharged home today.  Plan:  Patient discharged from physical therapy to care of nursing for ambulation daily as tolerated for length of stay.    Follow Up Recommendations Home health PT;Supervision for mobility/OOB;Supervision - Intermittent    Equipment Recommendations  None  recommended by PT    Recommendations for Other Services       Precautions / Restrictions Precautions Precautions: Fall Restrictions Weight Bearing Restrictions: No      Mobility  Bed Mobility Overal bed mobility: Modified Independent             General bed mobility comments: increased time  Transfers Overall transfer level: Needs assistance Equipment used: Rolling walker (2 wheeled) Transfers: Sit to/from [facility] Sit to Stand: Supervision Stand pivot transfers: Min guard;Supervision       General transfer comment: slightly labored movement  Ambulation/Gait Ambulation/Gait assistance: Supervision Gait Distance (Feet): 60 Feet Assistive device: Rolling walker (2 wheeled) Gait Pattern/deviations: Decreased step length - right;Decreased step length - left;Decreased stride length Gait velocity: decreased   General Gait Details: slightly labored slow cadence without loss of balance, limited secondary to c/o fatigue  Stairs            Wheelchair Mobility    Modified Rankin (Stroke Patients Only)       Balance Overall balance assessment: Needs assistance Sitting-balance support: Feet supported;No upper extremity supported Sitting balance-Leahy Scale: Good     Standing balance support: During functional activity;No upper extremity supported Standing balance-Leahy Scale: Poor Standing balance comment: fair/poor without AD, fair using RW                             Pertinent Vitals/Pain Pain Assessment: No/denies pain    Home Living Family/patient expects to be discharged to:: Private residence Living Arrangements: Children(daughter) Available Help at Discharge: Family;Available 24 hours/day Type of Home: House Home Access: Stairs to enter Entrance  Stairs-Rails: None Entrance Stairs-Number of Steps: 1 Home Layout: One level;Laundry or work area in basement;Able to live on main level with bedroom/bathroom Home  Equipment: Environmental consultant - 2 wheels;Cane - single point;Shower seat;Grab bars - tub/shower;Bedside commode      Prior Function Level of Independence: Independent         Comments: community ambulator, drives     Hand Dominance   Dominant Hand: Right    Extremity/Trunk Assessment   Upper Extremity Assessment Upper Extremity Assessment: Generalized weakness    Lower Extremity Assessment Lower Extremity Assessment: Generalized weakness    Cervical / Trunk Assessment Cervical / Trunk Assessment: Normal  Communication   Communication: No difficulties  Cognition Arousal/Alertness: Awake/alert Behavior During Therapy: WFL for tasks assessed/performed Overall Cognitive Status: Within Functional Limits for tasks assessed                                        General Comments      Exercises     Assessment/Plan    PT Assessment All further PT needs can be met in the next venue of care  PT Problem List Decreased strength;Decreased activity tolerance;Decreased balance;Decreased mobility       PT Treatment Interventions      PT Goals (Current goals can be found in the Care Plan section)  Acute Rehab PT Goals Patient Stated Goal: return home with family to assist PT Goal Formulation: With patient/family Time For Goal Achievement: 10/01/18 Potential to Achieve Goals: Good    Frequency     Barriers to discharge        Co-evaluation               AM-PAC PT "6 Clicks" Daily Activity  Outcome Measure Difficulty turning over in bed (including adjusting bedclothes, sheets and blankets)?: None Difficulty moving from lying on back to sitting on the side of the bed? : None Difficulty sitting down on and standing up from a chair with arms (e.g., wheelchair, bedside commode, etc,.)?: A Little Help needed moving to and from a bed to chair (including a wheelchair)?: A Little Help needed walking in hospital room?: A Little Help needed climbing 3-5 steps with  a railing? : A Little 6 Click Score: 20    End of Session   Activity Tolerance: Patient tolerated treatment well;Patient limited by fatigue Patient left: in chair;with call bell/phone within reach;with family/visitor present Nurse Communication: Mobility status PT Visit Diagnosis: Unsteadiness on feet (R26.81);Other abnormalities of gait and mobility (R26.89);Muscle weakness (generalized) (M62.81)    Time: 4742-5956 PT Time Calculation (min) (ACUTE ONLY): 29 min   Charges:   PT Evaluation $PT Eval Moderate Complexity: 1 Mod PT Treatments $Therapeutic Activity: 23-37 mins        2:00 PM, 10/01/18 Lonell Grandchild, MPT Physical Therapist with [facility] [phone] office [phone] mobile phone

## 2018-10-01 NOTE — Discharge Summary (Signed)
Physician Discharge Summary  LINDSAY STRAKA WUJ:[id number] DOB: 01/31/1929 DOA: 09/30/2018  PCP: Celene Squibb, MD  Admit date: 09/30/2018 Discharge date: 10/01/2018  Time spent: 30 minutes  Recommendations for Outpatient Follow-up:  1. Repeat CBC to follow WBC's trend  2. Repeat BMET to follow electrolytes and renal function trend  3. Reassess BP and start antihypertensive regimen if needed.   Discharge Diagnoses:  Active Problems:   Other dysphagia   MGUS (monoclonal gammopathy of unknown significance)   Chest pain   Generalized weakness   Neck pain   Discharge Condition: stable and improved. Discharge home with instructions to follow up with PCP in 10 days.   Diet recommendation: heart healthy diet   Filed Weights   09/30/18 0948 10/01/18 0017  Weight: 51.3 kg 53 kg    History of present illness:  84 y.o. female with medical history of hypertension, hyperlipidemia, seizure, GERD, hypothyroidism, TIA, dysphagia presenting with chest pain and neck pain that began around 7:30 AM on 09/30/2018.  Patient was making her way back to the bedroom from her bathroom when she had sharp constant substernal pain with associated neck pain that was moderate to severe.  The patient denied any shortness of breath, nausea, vomiting, dizziness.  In the past week prior to admission, the patient had complained of some generalized weakness.  She denied any headache, visual disturbance, focal extremity weakness, coughing, hemoptysis, dysuria, hematuria, abdominal pain, vomiting, or diarrhea.  There is no hematochezia or melena.  She feels like she has had some subjective fevers and chills.  She denies any recent injuries or falls.  She denies any new medications.  In the emergency department, the patient finally received relief of her chest pain and neck pain after fentanyl IV. In the emergency department, the patient was afebrile hemodynamically stable saturating 90-95% on room air.  The patient initially  had soft blood pressure of the upper 80s but improved significantly after 500 cc of fluid.  BMP showed a potassium 3.2 with normal LFTs.  WBC was 16.2 with hemoglobin at her baseline.  Point-of-care troponin was negative. CT angiogram chest was negative for pulmonary embolus but showed R>L pleural parenchymal opacity consistent with scarring.  There is also subtle peribronchial wall thickening and hazy opacities in the bilateral lower lobes.  Patient had one episode of nausea and vomiting in the emergency department.  CT the abdomen and pelvis was unremarkable.  Lipase 35  Hospital Course:  1-atypical chest pain: Appears to be noncardiac in nature. -Most likely associated with gastroesophageal reflux disease. -Troponins negative x3 -EKG and telemetry without acute ischemic changes -Patient chest pain-free at discharge -Continue PPI.  2-generalized weakness and leukocytosis -Blood cultures were taken and by the time of discharge no growth appreciated -Patient denies dysuria, hematuria, or increased frequency. -CT angio of the chest without acute cardiopulmonary process and negative PE. -Normal TSH -Patient seen by physical therapy and no recommendations provided; patient safe to return home. -advised to keep herself well hydrated and to improve nutrition. -patient found dehydrated on exam and with increase specific gravity in UA.  3-necj pain -chronic and associated with degenerative disc disease -no acute abnormalities seen on cervical spine XR  4-hypokalemia -repleted  -Please repeat basic metabolic panel at follow-up visit to reassess electrolytes trend.  5-hypothyroidism -Continue the use of Synthroid. -TSH within normal limits.  6-COPD -No wheezing and no complaints of shortness of breath. -Speaking in full sentences and with good oxygen saturation on room air. -Continue home as  needed albuterol.  7-constipation -Continue the use of Senokot. -Most likely associated with  decreased oral hydration and underlying history of hypothyroidism.  8-history of TIA -No acute symptoms appreciated. -Continue aspirin for secondary prevention.  9-elevated blood pressure -Patient reports using sun antihypertensive medication that she had at home; but expressed that her cardiologist and PCP has essentially taking her off any blood pressure medications due to low blood pressure. -At this moment her blood pressure has remained stable and no requiring medications to be started. -Patient advised to follow low-sodium diet and to create a blood pressure log shaking her blood pressure at least 4 times a day on to follow-up with her PCP.   Procedures:  See below for x-ray reports.  Consultations:  None  Discharge Exam: Vitals:   10/01/18 0540 10/01/18 1255  BP: 140/76 135/78  Pulse: 92 (!) 101  Resp:  20  Temp: 98.2 F (36.8 C) 98.7 F (37.1 C)  SpO2: 98% 99%    General: Afebrile, denies chest pain, no shortness of breath, no nausea, no vomiting. Cardiovascular: S1 and S2, no rubs, no gallops, no murmurs appreciated on exam.  No JVD. Respiratory: Good air movement bilaterally, no wheezing, no crackles. Abdomen: soft, nontender, nondistended, positive bowel sounds, no guarding. Extremities:  no cyanosis, no clubbing, no edema. Neurologic exam: cranial nerves II through XII grossly intact; strength 5 out of 5 bilaterally upper and lower extremities, no dysmetria.  Discharge Instructions   Discharge Instructions    Diet - low sodium heart healthy   Complete by:  As directed    Discharge instructions   Complete by:  As directed    Take medications as prescribed Follow low-sodium diet Keep yourself well-hydrated Arrange follow-up with PCP in 10 days Make sure to check your blood pressure at least 4 times a day and keep a log to discuss need of antihypertensive agents with your primary care doctor.   Increase activity slowly   Complete by:  As directed       Allergies as of 10/01/2018      Reactions   Celecoxib Shortness Of Breath   Dexilant [dexlansoprazole] Anaphylaxis   abd pain      Medication List    STOP taking these medications   cephALEXin 500 MG capsule Commonly known as:  KEFLEX     TAKE these medications   aspirin 81 MG tablet Take 81 mg by mouth daily.   calcium-vitamin D 500-200 MG-UNIT tablet Commonly known as:  OSCAL WITH D Take 1 tablet by mouth daily.   diazepam 5 MG tablet Commonly known as:  VALIUM Take one tablet by mouth at bedtime as needed for sleep What changed:    how much to take  how to take this  when to take this  reasons to take this  additional instructions   EPINEPHrine 0.3 mg/0.3 mL Soaj injection Commonly known as:  EPI-PEN Inject into the muscle as needed for anaphylaxis.   GLUCOSAMINE PO Take 1 tablet by mouth 2 (two) times daily.   ipratropium 0.03 % nasal spray Commonly known as:  ATROVENT Place 2 sprays into both nostrils daily as needed (allergies stuffy nose).   levocetirizine 5 MG tablet Commonly known as:  XYZAL Take 5 mg by mouth every evening.   levothyroxine 75 MCG tablet Commonly known as:  SYNTHROID, LEVOTHROID Take 1 tablet (75 mcg total) by mouth daily.   montelukast 10 MG tablet Commonly known as:  SINGULAIR Take 10 mg by mouth at bedtime.  omeprazole 20 MG capsule Commonly known as:  PRILOSEC 1 PO 30 mins prior to breakfast and supper   pravastatin 80 MG tablet Commonly known as:  PRAVACHOL Take 1 tablet (80 mg total) by mouth every evening.   PROVENTIL HFA 108 (90 Base) MCG/ACT inhaler Generic drug:  albuterol Inhale 1-2 puffs into the lungs every 6 (six) hours as needed for wheezing or shortness of breath. Wheezing, Asthma Symptoms   senna 8.6 MG tablet Commonly known as:  SENOKOT Take 1 tablet by mouth daily.   vitamin B-12 1000 MCG tablet Commonly known as:  CYANOCOBALAMIN Take 1,000 mcg by mouth every morning.   vitamin C 500 MG  tablet Commonly known as:  ASCORBIC ACID Take 500 mg by mouth daily.   vitamin E 400 UNIT capsule Take 400 Units by mouth at bedtime.      Allergies  Allergen Reactions  . Celecoxib Shortness Of Breath  . Dexilant [Dexlansoprazole] Anaphylaxis    abd pain   Follow-up Information    Celene Squibb, MD. Schedule an appointment as soon as possible for a visit in 10 day(s).   Specialty:  Internal Medicine Contact information: [place] [address] [phone]            The results of significant diagnostics from this hospitalization (including imaging, microbiology, ancillary and laboratory) are listed below for reference.    Significant Diagnostic Studies: Ct Angio Chest Pe W And/or Wo Contrast  Result Date: 09/30/2018 CLINICAL DATA:  Epigastric pain. Bilateral neck pain. Mid chest pain. Symptoms today history of hypertension and a thyroidectomy. History also mentions nausea and vomiting. EXAM: CT ANGIOGRAPHY CHEST CT ABDOMEN AND PELVIS WITH CONTRAST TECHNIQUE: Multidetector CT imaging of the chest was performed using the standard protocol during bolus administration of intravenous contrast. Multiplanar CT image reconstructions and MIPs were obtained to evaluate the vascular anatomy. Multidetector CT imaging of the abdomen and pelvis was performed using the standard protocol during bolus administration of intravenous contrast. CONTRAST:  71mL ISOVUE-300 IOPAMIDOL (ISOVUE-300) INJECTION 61%, 171mL ISOVUE-370 IOPAMIDOL (ISOVUE-370) INJECTION 76% COMPARISON:  None. FINDINGS: CTA CHEST FINDINGS Cardiovascular: Satisfactory opacification of the pulmonary arteries to the segmental level. No evidence of pulmonary embolism. Normal heart size. No pericardial effusion. No coronary artery calcifications. Great vessels are normal in caliber. No aortic dissection. Atherosclerotic changes noted along the aortic arch and descending thoracic aorta. Aortic arch branch vessels are  widely patent. Mediastinum/Nodes: No neck base mediastinal or hilar masses or pathologically enlarged lymph nodes. Trachea is widely patent. Esophagus is unremarkable. Lungs/Pleura: Right greater than left pleuroparenchymal opacities consistent with scarring. Linear and reticular opacities noted in the lower lobes, left upper lobe lingula and right middle lobe, consistent with a combination of scarring and subsegmental atelectasis. Subtle peribronchovascular hazy opacities noted in the lower lobes, likely atelectasis or scarring. Infection is possible but felt less likely. There is no evidence of pulmonary edema. No pleural effusion or pneumothorax. Musculoskeletal: No fracture or acute finding. No osteoblastic or osteolytic lesions. Partly imaged bilateral shoulder prostheses appear well seated and aligned. Review of the MIP images confirms the above findings. CT ABDOMEN and PELVIS FINDINGS Hepatobiliary: No focal liver abnormality is seen. No gallstones, gallbladder wall thickening, or biliary dilatation. Pancreas: Unremarkable. No pancreatic ductal dilatation or surrounding inflammatory changes. Spleen: Normal in size without focal abnormality. Adrenals/Urinary Tract: No adrenal masses. Kidneys normal size, orientation and position with symmetric enhancement and excretion. There is left renal cortical thinning. Subcentimeter low-density masses arise  from the upper pole and midpole the left kidney consistent with cysts. No other renal masses, no stones and no hydronephrosis. Ureters are normal course and in caliber.  No ureteral stones. Bladder is collapsed. Stomach/Bowel: Stomach is unremarkable. Small bowel and colon are normal in caliber. No wall thickening or inflammatory changes. Appendix not visualized. No evidence of appendicitis. Vascular/Lymphatic: Aortic atherosclerosis. No enlarged abdominal or pelvic lymph nodes. Reproductive: Status post hysterectomy. No adnexal masses. Other: No abdominal wall hernia  or abnormality. No abdominopelvic ascites. Musculoskeletal: No fracture or acute finding. No osteoblastic or osteolytic lesions. Review of the MIP images confirms the above findings. IMPRESSION: CHEST CTA 1. No evidence of a pulmonary embolism. 2. Right greater than left pleuroparenchymal opacity that is most consistent with scarring. 3. Subtle peribronchovascular hazy opacities in the lower lobes likely scarring or atelectasis. Infection is possible and should be considered in the proper clinical setting. No other evidence of acute abnormality in the chest. 4. Aortic atherosclerosis. ABDOMEN AND PELVIS CT 1. No acute findings within the abdomen or pelvis. No findings to account for abdominal pain. 2. Aortic atherosclerosis. Electronically Signed   By: Lajean Manes M.D.   On: 09/30/2018 15:18   Ct Cervical Spine Wo Contrast  Result Date: 09/30/2018 CLINICAL DATA:  Chronic neck pain EXAM: CT CERVICAL SPINE WITHOUT CONTRAST TECHNIQUE: Multidetector CT imaging of the cervical spine was performed without intravenous contrast. Multiplanar CT image reconstructions were also generated. COMPARISON:  MRI cervical spine dated 06/06/2017 FINDINGS: Alignment: Normal cervical lordosis. Skull base and vertebrae: No acute fracture. No primary bone lesion or focal pathologic process. Soft tissues and spinal canal: No prevertebral fluid or swelling. No visible canal hematoma. Disc levels:  Mild degenerative changes, most prominent at C5-6. Spinal canal is patent. Mild narrowing of the right C4-5 neural foramen. Mild narrowing of the bilateral C6-7 neural foramina. Upper chest: Visualized lung apices are notable for biapical pleural-parenchymal scarring. Other: Status post thyroidectomy. IMPRESSION: Mild degenerative changes of the lower cervical spine, as above. Electronically Signed   By: Julian Hy M.D.   On: 09/30/2018 21:24   Ct Abdomen Pelvis W Contrast  Result Date: 09/30/2018 CLINICAL DATA:  Epigastric pain.  Bilateral neck pain. Mid chest pain. Symptoms today history of hypertension and a thyroidectomy. History also mentions nausea and vomiting. EXAM: CT ANGIOGRAPHY CHEST CT ABDOMEN AND PELVIS WITH CONTRAST TECHNIQUE: Multidetector CT imaging of the chest was performed using the standard protocol during bolus administration of intravenous contrast. Multiplanar CT image reconstructions and MIPs were obtained to evaluate the vascular anatomy. Multidetector CT imaging of the abdomen and pelvis was performed using the standard protocol during bolus administration of intravenous contrast. CONTRAST:  42mL ISOVUE-300 IOPAMIDOL (ISOVUE-300) INJECTION 61%, 166mL ISOVUE-370 IOPAMIDOL (ISOVUE-370) INJECTION 76% COMPARISON:  None. FINDINGS: CTA CHEST FINDINGS Cardiovascular: Satisfactory opacification of the pulmonary arteries to the segmental level. No evidence of pulmonary embolism. Normal heart size. No pericardial effusion. No coronary artery calcifications. Great vessels are normal in caliber. No aortic dissection. Atherosclerotic changes noted along the aortic arch and descending thoracic aorta. Aortic arch branch vessels are widely patent. Mediastinum/Nodes: No neck base mediastinal or hilar masses or pathologically enlarged lymph nodes. Trachea is widely patent. Esophagus is unremarkable. Lungs/Pleura: Right greater than left pleuroparenchymal opacities consistent with scarring. Linear and reticular opacities noted in the lower lobes, left upper lobe lingula and right middle lobe, consistent with a combination of scarring and subsegmental atelectasis. Subtle peribronchovascular hazy opacities noted in the lower lobes, likely atelectasis or  scarring. Infection is possible but felt less likely. There is no evidence of pulmonary edema. No pleural effusion or pneumothorax. Musculoskeletal: No fracture or acute finding. No osteoblastic or osteolytic lesions. Partly imaged bilateral shoulder prostheses appear well seated and  aligned. Review of the MIP images confirms the above findings. CT ABDOMEN and PELVIS FINDINGS Hepatobiliary: No focal liver abnormality is seen. No gallstones, gallbladder wall thickening, or biliary dilatation. Pancreas: Unremarkable. No pancreatic ductal dilatation or surrounding inflammatory changes. Spleen: Normal in size without focal abnormality. Adrenals/Urinary Tract: No adrenal masses. Kidneys normal size, orientation and position with symmetric enhancement and excretion. There is left renal cortical thinning. Subcentimeter low-density masses arise from the upper pole and midpole the left kidney consistent with cysts. No other renal masses, no stones and no hydronephrosis. Ureters are normal course and in caliber.  No ureteral stones. Bladder is collapsed. Stomach/Bowel: Stomach is unremarkable. Small bowel and colon are normal in caliber. No wall thickening or inflammatory changes. Appendix not visualized. No evidence of appendicitis. Vascular/Lymphatic: Aortic atherosclerosis. No enlarged abdominal or pelvic lymph nodes. Reproductive: Status post hysterectomy. No adnexal masses. Other: No abdominal wall hernia or abnormality. No abdominopelvic ascites. Musculoskeletal: No fracture or acute finding. No osteoblastic or osteolytic lesions. Review of the MIP images confirms the above findings. IMPRESSION: CHEST CTA 1. No evidence of a pulmonary embolism. 2. Right greater than left pleuroparenchymal opacity that is most consistent with scarring. 3. Subtle peribronchovascular hazy opacities in the lower lobes likely scarring or atelectasis. Infection is possible and should be considered in the proper clinical setting. No other evidence of acute abnormality in the chest. 4. Aortic atherosclerosis. ABDOMEN AND PELVIS CT 1. No acute findings within the abdomen or pelvis. No findings to account for abdominal pain. 2. Aortic atherosclerosis. Electronically Signed   By: Lajean Manes M.D.   On: 09/30/2018 15:18    Dg Chest Portable 1 View  Result Date: 09/30/2018 CLINICAL DATA:  Chest pain and left neck pain. EXAM: PORTABLE CHEST 1 VIEW COMPARISON:  02/04/2015 FINDINGS: The cardiac silhouette is accentuated by AP technique and shallower lung inflation compared to the prior study. Aortic atherosclerosis is noted. Mild biapical pleural thickening is noted. No airspace consolidation, edema, sizable pleural effusion, or pneumothorax is identified. Bilateral shoulder arthroplasties are noted. IMPRESSION: No active disease. Electronically Signed   By: Logan Bores M.D.   On: 09/30/2018 10:55    Microbiology: Recent Results (from the past 240 hour(s))  Culture, blood (Routine X 2) w Reflex to ID Panel     Status: None (Preliminary result)   Collection Time: 09/30/18  8:35 PM  Result Value Ref Range Status   Specimen Description LEFT ANTECUBITAL  Final   Special Requests   Final    BOTTLES DRAWN AEROBIC AND ANAEROBIC Blood Culture adequate volume   Culture   Final    NO GROWTH < 12 HOURS Performed at [facility], [address] [place], [place] [address]    Report Status PENDING  Incomplete  Culture, blood (Routine X 2) w Reflex to ID Panel     Status: None (Preliminary result)   Collection Time: 09/30/18  8:39 PM  Result Value Ref Range Status   Specimen Description BLOOD LEFT WRIST  Final   Special Requests   Final    BOTTLES DRAWN AEROBIC AND ANAEROBIC Blood Culture adequate volume   Culture   Final    NO GROWTH < 12 HOURS Performed at [facility], [address] [place],  [address]  Report Status PENDING  Incomplete     Labs: Basic Metabolic Panel: Recent Labs  Lab 09/30/18 1031 10/01/18 0221  NA 142 139  K 3.2* 3.8  CL 107 108  CO2 27 26  GLUCOSE 126* 137*  BUN 20 20  CREATININE 0.86 0.85  CALCIUM 9.0 8.7*  MG  --  1.8   Liver Function Tests: Recent Labs  Lab 09/30/18 1052  AST 26  ALT 13  ALKPHOS 52  BILITOT 0.8  PROT 7.2  ALBUMIN 3.6   Recent Labs  Lab  09/30/18 1052  LIPASE 35   CBC: Recent Labs  Lab 09/30/18 1031 10/01/18 0221  WBC 16.2* 16.5*  NEUTROABS  --  12.3*  HGB 10.9* 9.6*  HCT 34.0* 29.5*  MCV 90.4 91.0  PLT 204 155   Cardiac Enzymes: Recent Labs  Lab 09/30/18 2034 10/01/18 0221 10/01/18 0911  TROPONINI <0.03 <0.03 <0.03    Signed:  Barton Dubois MD.  [facility] 10/01/2018, 1:32 PM

## 2018-10-01 NOTE — Progress Notes (Signed)
Per nurse, patient being discharged, no need for echo.

## 2018-10-02 ENCOUNTER — Other Ambulatory Visit (HOSPITAL_COMMUNITY): Payer: Self-pay | Admitting: Internal Medicine

## 2018-10-02 DIAGNOSIS — Z780 Asymptomatic menopausal state: Secondary | ICD-10-CM

## 2018-10-02 LAB — URINE CULTURE: Culture: <10000 — AB

## 2018-10-05 LAB — CULTURE, BLOOD (ROUTINE X 2)
CULTURE: NO GROWTH
Culture: NO GROWTH
Special Requests: ADEQUATE
Special Requests: ADEQUATE

## 2018-10-06 DIAGNOSIS — D631 Anemia in chronic kidney disease: Secondary | ICD-10-CM | POA: Diagnosis not present

## 2018-10-06 DIAGNOSIS — D519 Vitamin B12 deficiency anemia, unspecified: Secondary | ICD-10-CM | POA: Diagnosis not present

## 2018-10-06 DIAGNOSIS — D72829 Elevated white blood cell count, unspecified: Secondary | ICD-10-CM | POA: Diagnosis not present

## 2018-10-06 DIAGNOSIS — J060 Acute laryngopharyngitis: Secondary | ICD-10-CM | POA: Diagnosis not present

## 2018-10-06 DIAGNOSIS — D472 Monoclonal gammopathy: Secondary | ICD-10-CM | POA: Diagnosis not present

## 2018-10-06 DIAGNOSIS — K219 Gastro-esophageal reflux disease without esophagitis: Secondary | ICD-10-CM | POA: Diagnosis not present

## 2018-10-06 DIAGNOSIS — Z681 Body mass index (BMI) 19 or less, adult: Secondary | ICD-10-CM | POA: Diagnosis not present

## 2018-10-06 DIAGNOSIS — N390 Urinary tract infection, site not specified: Secondary | ICD-10-CM | POA: Diagnosis not present

## 2018-10-06 DIAGNOSIS — E039 Hypothyroidism, unspecified: Secondary | ICD-10-CM | POA: Diagnosis not present

## 2018-10-06 DIAGNOSIS — J309 Allergic rhinitis, unspecified: Secondary | ICD-10-CM | POA: Diagnosis not present

## 2018-10-06 DIAGNOSIS — Z6821 Body mass index (BMI) 21.0-21.9, adult: Secondary | ICD-10-CM | POA: Diagnosis not present

## 2018-10-06 DIAGNOSIS — Z0000 Encounter for general adult medical examination without abnormal findings: Secondary | ICD-10-CM | POA: Diagnosis not present

## 2018-10-06 DIAGNOSIS — E785 Hyperlipidemia, unspecified: Secondary | ICD-10-CM | POA: Diagnosis not present

## 2018-10-06 DIAGNOSIS — F411 Generalized anxiety disorder: Secondary | ICD-10-CM | POA: Diagnosis not present

## 2018-10-06 DIAGNOSIS — I10 Essential (primary) hypertension: Secondary | ICD-10-CM | POA: Diagnosis not present

## 2018-10-06 DIAGNOSIS — R809 Proteinuria, unspecified: Secondary | ICD-10-CM | POA: Diagnosis not present

## 2018-10-06 DIAGNOSIS — J302 Other seasonal allergic rhinitis: Secondary | ICD-10-CM | POA: Diagnosis not present

## 2018-10-08 ENCOUNTER — Ambulatory Visit: Payer: Medicare Other | Admitting: Gastroenterology

## 2018-10-10 DIAGNOSIS — T1490XA Injury, unspecified, initial encounter: Secondary | ICD-10-CM | POA: Diagnosis not present

## 2018-10-10 DIAGNOSIS — R5381 Other malaise: Secondary | ICD-10-CM | POA: Diagnosis not present

## 2018-10-13 ENCOUNTER — Telehealth: Payer: Self-pay | Admitting: Gastroenterology

## 2018-10-13 MED ORDER — OMEPRAZOLE 20 MG PO CPDR: DELAYED_RELEASE_CAPSULE | ORAL | 5 refills | Status: DC

## 2018-10-13 NOTE — Telephone Encounter (Signed)
Done

## 2018-10-13 NOTE — Telephone Encounter (Signed)
PATIENT CALLED AND NEEDS HER PRESCRIPTION SENT TO [facility], SHE CAN NOT REMEMBER THE NAME BUT IT IS THE ONLY ONE [facility] HAS HER ON

## 2018-10-13 NOTE — Addendum Note (Signed)
Addended by: Annitta Needs on: 10/13/2018 03:39 PM   Modules accepted: Orders

## 2018-10-13 NOTE — Telephone Encounter (Signed)
Please send in refill for Omeprazole.

## 2018-10-14 ENCOUNTER — Telehealth: Payer: Self-pay | Admitting: Gastroenterology

## 2018-10-14 NOTE — Telephone Encounter (Signed)
Called, many rings and no answer.

## 2018-10-14 NOTE — Telephone Encounter (Addendum)
I called pt's home and informed her daughter that I have taken care of her prescription.  FYI to Roseanne Kaufman who authorized refills.

## 2018-10-14 NOTE — Telephone Encounter (Signed)
[place] PATIENT, SHE HAS QUESTIONS ABOUT HER PRESCRIPTION THAT WAS CALLED IN

## 2018-10-14 NOTE — Telephone Encounter (Signed)
Came by the office and said she wanted her prescription for the Omeprazole for 90 days and not 30 days. I called [facility] [phone]) and spoke to [place]. She took the order for the Omeprazole 20 mg bid 30 min before breakfast for #180 capsules and one refill.  She will make a note for the other Rx not to be shipped.

## 2018-10-15 ENCOUNTER — Ambulatory Visit (HOSPITAL_COMMUNITY)
Admission: RE | Admit: 2018-10-15 | Discharge: 2018-10-15 | Disposition: A | Discharge status: Home / Self Care | Payer: Medicare Other | Source: Ambulatory Visit | Attending: Internal Medicine | Admitting: Internal Medicine

## 2018-10-15 DIAGNOSIS — Z780 Asymptomatic menopausal state: Secondary | ICD-10-CM | POA: Diagnosis not present

## 2018-10-15 DIAGNOSIS — M8589 Other specified disorders of bone density and structure, multiple sites: Secondary | ICD-10-CM | POA: Insufficient documentation

## 2018-10-17 DIAGNOSIS — N3001 Acute cystitis with hematuria: Secondary | ICD-10-CM | POA: Diagnosis not present

## 2018-10-17 DIAGNOSIS — N390 Urinary tract infection, site not specified: Secondary | ICD-10-CM | POA: Diagnosis not present

## 2018-10-17 DIAGNOSIS — Z6822 Body mass index (BMI) 22.0-22.9, adult: Secondary | ICD-10-CM | POA: Diagnosis not present

## 2018-10-17 DIAGNOSIS — R809 Proteinuria, unspecified: Secondary | ICD-10-CM | POA: Diagnosis not present

## 2018-10-18 DIAGNOSIS — Z6821 Body mass index (BMI) 21.0-21.9, adult: Secondary | ICD-10-CM | POA: Diagnosis not present

## 2018-10-18 DIAGNOSIS — E039 Hypothyroidism, unspecified: Secondary | ICD-10-CM | POA: Diagnosis not present

## 2018-10-18 DIAGNOSIS — D631 Anemia in chronic kidney disease: Secondary | ICD-10-CM | POA: Diagnosis not present

## 2018-10-18 DIAGNOSIS — D519 Vitamin B12 deficiency anemia, unspecified: Secondary | ICD-10-CM | POA: Diagnosis not present

## 2018-10-18 DIAGNOSIS — R809 Proteinuria, unspecified: Secondary | ICD-10-CM | POA: Diagnosis not present

## 2018-10-18 DIAGNOSIS — D72829 Elevated white blood cell count, unspecified: Secondary | ICD-10-CM | POA: Diagnosis not present

## 2018-10-18 DIAGNOSIS — D472 Monoclonal gammopathy: Secondary | ICD-10-CM | POA: Diagnosis not present

## 2018-10-18 DIAGNOSIS — J302 Other seasonal allergic rhinitis: Secondary | ICD-10-CM | POA: Diagnosis not present

## 2018-10-18 DIAGNOSIS — F411 Generalized anxiety disorder: Secondary | ICD-10-CM | POA: Diagnosis not present

## 2018-10-18 DIAGNOSIS — E785 Hyperlipidemia, unspecified: Secondary | ICD-10-CM | POA: Diagnosis not present

## 2018-10-18 DIAGNOSIS — K219 Gastro-esophageal reflux disease without esophagitis: Secondary | ICD-10-CM | POA: Diagnosis not present

## 2018-10-18 DIAGNOSIS — I10 Essential (primary) hypertension: Secondary | ICD-10-CM | POA: Diagnosis not present

## 2018-10-22 ENCOUNTER — Other Ambulatory Visit (HOSPITAL_COMMUNITY): Payer: Medicare Other

## 2018-10-22 ENCOUNTER — Ambulatory Visit (HOSPITAL_COMMUNITY): Payer: Medicare Other | Admitting: Internal Medicine

## 2018-10-27 ENCOUNTER — Telehealth: Payer: Self-pay | Admitting: Endocrinology

## 2018-10-27 NOTE — Telephone Encounter (Signed)
Called pt son--stated pt having symptoms--weakness/tired  for the passed 2 weeks. Pt went to ED for high blood pressure and UTI--which is getting better. Also, talk to the pt--stated  not eating well but drinking water and  asking if the Rx thyroid and iron medication making her weak....please advise

## 2018-10-27 NOTE — Telephone Encounter (Signed)
Please notify patient that her thyroid medicine is not the cause for her symptoms.   Could be her anemia is the reason for weakness. I would suggest calling her PCP. To make sure she is responding well to infection treatment

## 2018-10-27 NOTE — Telephone Encounter (Signed)
Patients son has called stating her hemoglobin is at a 10. She is very tired and could not get out of bed this morning. Would like to know what to do? Please Advise. Ph # [phone]

## 2018-10-27 NOTE — Telephone Encounter (Signed)
Patient's son Herbie [place] ph# [phone] called re:  He was told someone would be calling him today about the patient being tired and sleeping a lot after speaking with the Dr. on-call. Patient is asking if the iron pill will interfere with her thyroid medication. Please call Robert/patient at the ph# listed above to advise.

## 2018-10-28 DIAGNOSIS — K219 Gastro-esophageal reflux disease without esophagitis: Secondary | ICD-10-CM | POA: Diagnosis not present

## 2018-10-28 DIAGNOSIS — J302 Other seasonal allergic rhinitis: Secondary | ICD-10-CM | POA: Diagnosis not present

## 2018-10-28 DIAGNOSIS — E039 Hypothyroidism, unspecified: Secondary | ICD-10-CM | POA: Diagnosis not present

## 2018-10-28 DIAGNOSIS — F411 Generalized anxiety disorder: Secondary | ICD-10-CM | POA: Diagnosis not present

## 2018-10-28 DIAGNOSIS — D631 Anemia in chronic kidney disease: Secondary | ICD-10-CM | POA: Diagnosis not present

## 2018-10-28 DIAGNOSIS — Z6820 Body mass index (BMI) 20.0-20.9, adult: Secondary | ICD-10-CM | POA: Diagnosis not present

## 2018-10-28 DIAGNOSIS — R531 Weakness: Secondary | ICD-10-CM | POA: Diagnosis not present

## 2018-10-28 DIAGNOSIS — R5383 Other fatigue: Secondary | ICD-10-CM | POA: Diagnosis not present

## 2018-10-28 DIAGNOSIS — I10 Essential (primary) hypertension: Secondary | ICD-10-CM | POA: Diagnosis not present

## 2018-10-28 DIAGNOSIS — D519 Vitamin B12 deficiency anemia, unspecified: Secondary | ICD-10-CM | POA: Diagnosis not present

## 2018-10-28 NOTE — Telephone Encounter (Signed)
LVM-to return call back at the office

## 2018-10-28 NOTE — Telephone Encounter (Signed)
LVM--to return call back at the office.Marland KitchenMarland Kitchen

## 2018-10-30 NOTE — Telephone Encounter (Signed)
Called pt son back and stated pt went to her PCP--- change the time  30 minutes gap between her Iron and thyroid pills and doing much better yesterday

## 2018-11-04 ENCOUNTER — Encounter (HOSPITAL_COMMUNITY): Payer: Self-pay

## 2018-11-04 ENCOUNTER — Emergency Department (HOSPITAL_COMMUNITY): Payer: Medicare Other

## 2018-11-04 ENCOUNTER — Other Ambulatory Visit: Payer: Self-pay

## 2018-11-04 ENCOUNTER — Emergency Department (HOSPITAL_COMMUNITY)
Admission: EM | Admit: 2018-11-04 | Discharge: 2018-11-04 | Disposition: A | Discharge status: Home / Self Care | Payer: Medicare Other | Attending: Emergency Medicine | Admitting: Emergency Medicine

## 2018-11-04 DIAGNOSIS — E039 Hypothyroidism, unspecified: Secondary | ICD-10-CM | POA: Insufficient documentation

## 2018-11-04 DIAGNOSIS — J45909 Unspecified asthma, uncomplicated: Secondary | ICD-10-CM | POA: Insufficient documentation

## 2018-11-04 DIAGNOSIS — Z96651 Presence of right artificial knee joint: Secondary | ICD-10-CM | POA: Insufficient documentation

## 2018-11-04 DIAGNOSIS — R0902 Hypoxemia: Secondary | ICD-10-CM | POA: Diagnosis not present

## 2018-11-04 DIAGNOSIS — R05 Cough: Secondary | ICD-10-CM | POA: Diagnosis not present

## 2018-11-04 DIAGNOSIS — I10 Essential (primary) hypertension: Secondary | ICD-10-CM | POA: Insufficient documentation

## 2018-11-04 DIAGNOSIS — R42 Dizziness and giddiness: Secondary | ICD-10-CM | POA: Diagnosis not present

## 2018-11-04 DIAGNOSIS — D508 Other iron deficiency anemias: Secondary | ICD-10-CM | POA: Insufficient documentation

## 2018-11-04 DIAGNOSIS — J90 Pleural effusion, not elsewhere classified: Secondary | ICD-10-CM | POA: Diagnosis not present

## 2018-11-04 DIAGNOSIS — Z85828 Personal history of other malignant neoplasm of skin: Secondary | ICD-10-CM | POA: Diagnosis not present

## 2018-11-04 DIAGNOSIS — G4489 Other headache syndrome: Secondary | ICD-10-CM | POA: Diagnosis not present

## 2018-11-04 DIAGNOSIS — R55 Syncope and collapse: Secondary | ICD-10-CM | POA: Diagnosis not present

## 2018-11-04 DIAGNOSIS — Z7982 Long term (current) use of aspirin: Secondary | ICD-10-CM | POA: Diagnosis not present

## 2018-11-04 DIAGNOSIS — Z96611 Presence of right artificial shoulder joint: Secondary | ICD-10-CM | POA: Insufficient documentation

## 2018-11-04 DIAGNOSIS — J9811 Atelectasis: Secondary | ICD-10-CM | POA: Diagnosis not present

## 2018-11-04 DIAGNOSIS — Z79899 Other long term (current) drug therapy: Secondary | ICD-10-CM | POA: Insufficient documentation

## 2018-11-04 DIAGNOSIS — I959 Hypotension, unspecified: Secondary | ICD-10-CM | POA: Diagnosis not present

## 2018-11-04 DIAGNOSIS — Z87891 Personal history of nicotine dependence: Secondary | ICD-10-CM | POA: Insufficient documentation

## 2018-11-04 DIAGNOSIS — R531 Weakness: Secondary | ICD-10-CM | POA: Diagnosis not present

## 2018-11-04 LAB — CBC WITH DIFFERENTIAL/PLATELET
Abs Immature Granulocytes: 0.03 10*3/uL (ref 0.00–0.07)
BASOS ABS: 0.1 10*3/uL (ref 0.0–0.1)
Basophils Relative: 1 %
Eosinophils Absolute: 0.4 10*3/uL (ref 0.0–0.5)
Eosinophils Relative: 5 %
HEMATOCRIT: 29.3 % — AB (ref 36.0–46.0)
HEMOGLOBIN: 9 g/dL — AB (ref 12.0–15.0)
Immature Granulocytes: 0 %
LYMPHS ABS: 1.1 10*3/uL (ref 0.7–4.0)
LYMPHS PCT: 12 %
MCH: 27.4 pg (ref 26.0–34.0)
MCHC: 30.7 g/dL (ref 30.0–36.0)
MCV: 89.1 fL (ref 80.0–100.0)
Monocytes Absolute: 0.8 10*3/uL (ref 0.1–1.0)
Monocytes Relative: 9 %
NEUTROS PCT: 73 %
NRBC: 0 % (ref 0.0–0.2)
Neutro Abs: 6.9 10*3/uL (ref 1.7–7.7)
Platelets: 247 10*3/uL (ref 150–400)
RBC: 3.29 MIL/uL — ABNORMAL LOW (ref 3.87–5.11)
RDW: 14.8 % (ref 11.5–15.5)
WBC: 9.3 10*3/uL (ref 4.0–10.5)

## 2018-11-04 LAB — URINALYSIS, ROUTINE W REFLEX MICROSCOPIC
Bilirubin Urine: NEGATIVE
GLUCOSE, UA: NEGATIVE mg/dL
Hgb urine dipstick: NEGATIVE
KETONES UR: NEGATIVE mg/dL
LEUKOCYTES UA: NEGATIVE
Nitrite: NEGATIVE
PH: 6 (ref 5.0–8.0)
Protein, ur: NEGATIVE mg/dL
Specific Gravity, Urine: 1.008 (ref 1.005–1.030)

## 2018-11-04 LAB — BASIC METABOLIC PANEL
ANION GAP: 7 (ref 5–15)
BUN: 17 mg/dL (ref 8–23)
CALCIUM: 8.8 mg/dL — AB (ref 8.9–10.3)
CO2: 25 mmol/L (ref 22–32)
Chloride: 108 mmol/L (ref 98–111)
Creatinine, Ser: 0.78 mg/dL (ref 0.44–1.00)
GFR calc Af Amer: >60 mL/min (ref >60)
GFR calc non Af Amer: >60 mL/min (ref >60)
GLUCOSE: 117 mg/dL — AB (ref 70–99)
Potassium: 3.5 mmol/L (ref 3.5–5.1)
Sodium: 140 mmol/L (ref 135–145)

## 2018-11-04 LAB — MAGNESIUM: MAGNESIUM: 1.8 mg/dL (ref 1.7–2.4)

## 2018-11-04 LAB — TROPONIN I: Troponin I: <0.03 ng/mL (ref <0.03)

## 2018-11-04 LAB — CBG MONITORING, ED: Glucose-Capillary: 98 mg/dL (ref 70–99)

## 2018-11-04 MED ORDER — SODIUM CHLORIDE 0.9 % IV BOLUS
500.0000 mL | Freq: Once | INTRAVENOUS | Status: AC
  Administered 2018-11-04: 500 mL via INTRAVENOUS

## 2018-11-04 NOTE — ED Triage Notes (Signed)
CBG 148 per [facility].

## 2018-11-04 NOTE — ED Provider Notes (Addendum)
[facility] EMERGENCY DEPARTMENT Provider Note   CSN: [id number] Arrival date & time: 11/04/18  1039     History   Chief Complaint Chief Complaint  Patient presents with  . Weakness  . Loss of Consciousness    HPI Christine Leblanc is a 84 y.o. female.  Patient presents with generalized weakness.  Patient has history of arthritis, anemia, asthma, hypothyroid and is had intermittent lightheadedness worse with standing.  Today patient felt generally weak and lightheaded and family member was able to help her/prevent her from falling.  Patient had brief syncope.  No chest pain or shortness of breath.  No cardiac history known.  Patient denies blood in the stools or new medications.     Past Medical History:  Diagnosis Date  . Anemia   . Asthma   . Cancer ([place])    skin Ca- ? basal cell   . Chronic back pain   . Chronic neck pain   . Degenerative joint disease    Left shoulder; cervical spine, knees & hands   . Gastroesophageal reflux disease    Hiatal hernia; distal esophageal web requiring dilatation; gastric polyps; gastritis; refuses colonoscopy  . GERD (gastroesophageal reflux disease)   . History of stress test 1990's   stress test done under the care of Dr. Lattie Haw & Dr. Gwenlyn Found, now being followed by Dr. Harl Bowie- in [place] , recently seen & told to f/U in one yr.   . Hyperlipidemia 04/27/2011  . Hypertension   . Hypothyroidism   . Lymphocytic thyroiditis   . Multiple thyroid nodules 2010   Adenomatous; thyroidectomy in 2010  . Osteoarthritis of right knee 07/27/2014  . Pneumonia    hosp. for pneumonia- long time ago   . Primary localized osteoarthrosis of right shoulder 06/21/2015  . Seizures ([place])    yes- as a child- & into adult years, states she took med. for them at one time, stopped at 32 yrs. of age   . Syncope and collapse 2007   Possible CVA in 2012 with left lower extremity weakness; refused hospitalization; CT-Atrophy and chronic microvascular ischemic  change.     Patient Active Problem List   Diagnosis Date Noted  . Neck pain   . Chest pain 09/30/2018  . Generalized weakness 09/30/2018  . Hypokalemia   . MGUS (monoclonal gammopathy of unknown significance) 10/21/2017  . Primary localized osteoarthrosis of right shoulder 06/21/2015  . S/P shoulder replacement 06/21/2015  . Osteoarthritis of right knee 07/27/2014  . Postsurgical hypothyroidism 04/22/2014  . Syncope and collapse   . Anemia, normocytic normochromic 04/26/2012  . Gastroesophageal reflux disease   . Hypertension 12/07/2011  . History of TIAs 12/07/2011  . Leg pain, bilateral 04/27/2011  . Hyperlipidemia 04/27/2011  . Other dysphagia 06/07/2010    Past Surgical History:  Procedure Laterality Date  . ABDOMINAL HYSTERECTOMY     fibroids  . CATARACT EXTRACTION     Bilateral; redo surgery on the right for incomplete primary procedure  . COLONOSCOPY  Remote  . ESOPHAGOGASTRODUODENOSCOPY  12/10   Dr. 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 ESOPHAGOGASTRODUODENOSCOPY N/A 03/21/2015   Procedure: ESOPHAGOGASTRODUODENOSCOPY (EGD);  Surgeon: Danie Binder, MD;  [facility];  Service: Endoscopy;  Laterality: N/A;  1130   . EYE SURGERY    . PROLAPSED UTERINE FIBROID LIGATION     outcomed with rectocele & cystocele  . SAVORY DILATION N/A 03/21/2015   Procedure: SAVORY DILATION;  Surgeon: Carlyon Prows  Rexene Edison, MD;  [facility];  Service: Endoscopy;  Laterality: N/A;  . SHOULDER SURGERY     left  . TOTAL KNEE ARTHROPLASTY Right 07/27/2014   Procedure: RIGHT TOTAL KNEE ARTHROPLASTY;  Surgeon: Johnny Bridge, MD;  Location: [facility];  Service: Orthopedics;  Laterality: Right;  . TOTAL SHOULDER ARTHROPLASTY Right 06/21/2015   Procedure: RIGHT TOTAL SHOULDER ARTHROPLASTY;  Surgeon: Marchia Bond, MD;  Location: [facility];  Service: Orthopedics;  Laterality: Right;  . TOTAL THYROIDECTOMY  2010     OB History     None      Home Medications    Prior to Admission medications   Medication Sig Start Date End Date Taking? Authorizing Provider  albuterol (PROVENTIL HFA) 108 (90 BASE) MCG/ACT inhaler Inhale 1-2 puffs into the lungs every 6 (six) hours as needed for wheezing or shortness of breath. Wheezing, Asthma Symptoms   Yes [provider]  aspirin 81 MG tablet Take 81 mg by mouth daily.   Yes [provider]  benzonatate (TESSALON) 200 MG capsule Take 200 mg by mouth 2 (two) times daily as needed.  10/30/18  Yes [provider]  calcium-vitamin D (OSCAL WITH D) 500-200 MG-UNIT per tablet Take 1 tablet by mouth daily.    Yes [provider]  diazepam (VALIUM) 5 MG tablet Take one tablet by mouth at bedtime as needed for sleep Patient taking differently: Take 2.5 mg by mouth at bedtime as needed (sleep).  08/04/14  Yes Estill Dooms, MD  ipratropium (ATROVENT) 0.03 % nasal spray Place 2 sprays into both nostrils daily as needed (allergies stuffy nose).  04/25/17  Yes [provider]  levocetirizine (XYZAL) 5 MG tablet Take 5 mg by mouth every evening.     Yes [provider]  levothyroxine (SYNTHROID, LEVOTHROID) 75 MCG tablet Take 1 tablet (75 mcg total) by mouth daily. 09/22/18  Yes Elayne Snare, MD  montelukast (SINGULAIR) 10 MG tablet Take 10 mg by mouth at bedtime.   Yes [provider]  omeprazole (PRILOSEC) 20 MG capsule 1 PO 30 mins prior to breakfast and supper Patient taking differently: Take 20 mg by mouth daily. 1 PO 30 mins prior to breakfast and supper 10/13/18  Yes Annitta Needs, NP  pravastatin (PRAVACHOL) 80 MG tablet Take 1 tablet (80 mg total) by mouth every evening. 03/03/18  Yes Branch, Alphonse Guild, MD  senna (SENOKOT) 8.6 MG tablet Take 1 tablet by mouth daily.   Yes [provider]  vitamin B-12 (CYANOCOBALAMIN) 1000 MCG tablet Take 1,000 mcg by mouth every morning.   Yes [provider]  vitamin C  (ASCORBIC ACID) 500 MG tablet Take 500 mg by mouth daily.   Yes [provider]  vitamin E 400 UNIT capsule Take 400 Units by mouth at bedtime.    Yes [provider]  EPINEPHrine 0.3 mg/0.3 mL IJ SOAJ injection Inject into the muscle as needed for anaphylaxis. 08/13/18   [provider]  Glucosamine HCl (GLUCOSAMINE PO) Take 1 tablet by mouth 2 (two) times daily.    [provider]    Family History Family History  Problem Relation Age of Onset  . Heart disease Mother   . Gallbladder disease Mother   . Heart failure Mother   . Breast cancer Sister        30 years ago  . Heart attack Father   . Colon cancer Neg Hx   . Stroke Neg Hx  Social History Social History   Tobacco Use  . Smoking status: Former Smoker    Packs/day: 0.80    Years: 20.00    Pack years: 16.00    Types: Cigarettes    Last attempt to quit: 12/31/1978    Years since quitting: 39.8  . Smokeless tobacco: Never Used  Substance Use Topics  . Alcohol use: No  . Drug use: No     Allergies   Celecoxib; Dexilant [dexlansoprazole]; and Famotidine   Review of Systems Review of Systems  Constitutional: Positive for fatigue. Negative for chills and fever.  HENT: Negative for congestion.   Eyes: Negative for visual disturbance.  Respiratory: Negative for shortness of breath.   Cardiovascular: Negative for chest pain.  Gastrointestinal: Negative for abdominal pain and vomiting.  Genitourinary: Negative for dysuria and flank pain.  Musculoskeletal: Negative for back pain, neck pain and neck stiffness.  Skin: Negative for rash.  Neurological: Positive for syncope, weakness and light-headedness. Negative for headaches.     Physical Exam Updated Vital Signs BP 130/80   Pulse 95   Temp 98 F (36.7 C) (Oral)   Resp 16   Ht 5\' 2"  (1.575 m)   Wt 51.3 kg   SpO2 98%   BMI 20.67 kg/m   Physical Exam  Constitutional: She is oriented to person, place, and time. She  appears well-developed and well-nourished.  HENT:  Head: Normocephalic and atraumatic.  Dry mm  Eyes: Right eye exhibits no discharge. Left eye exhibits no discharge.  Neck: Normal range of motion. Neck supple. No tracheal deviation present.  Cardiovascular: Normal rate and regular rhythm.  No murmur heard. Pulmonary/Chest: Effort normal and breath sounds normal.  Abdominal: Soft. She exhibits no distension. There is no tenderness. There is no guarding.  Musculoskeletal: She exhibits no edema.  Neurological: She is alert and oriented to person, place, and time. She has normal strength. No cranial nerve deficit or sensory deficit. GCS eye subscore is 4. GCS verbal subscore is 5. GCS motor subscore is 6.  Skin: Skin is warm. No rash noted.  Psychiatric: She has a normal mood and affect.  Nursing note and vitals reviewed.    ED Treatments / Results  Labs (all labs ordered are listed, but only abnormal results are displayed) Labs Reviewed  BASIC METABOLIC PANEL - Abnormal; Notable for the following components:      Result Value   Glucose, Bld 117 (*)    Calcium 8.8 (*)    All other components within normal limits  CBC WITH DIFFERENTIAL/PLATELET - Abnormal; Notable for the following components:   RBC 3.29 (*)    Hemoglobin 9.0 (*)    HCT 29.3 (*)    All other components within normal limits  URINALYSIS, ROUTINE W REFLEX MICROSCOPIC  TROPONIN I  MAGNESIUM  CBG MONITORING, ED    EKG EKG Interpretation  Date/Time:  Tuesday November 04 2018 10:55:13 EST Ventricular Rate:  94 PR Interval:    QRS Duration: 121 QT Interval:  374 QTC Calculation: 468 R Axis:   93 Text Interpretation:  Sinus rhythm Atrial premature complexes in couplets Right atrial enlargement RBBB and LPFB Baseline wander in lead(s) III aVL aVF Confirmed by Elnora Morrison [phone]) on 11/04/2018 11:03:57 AM   Radiology Dg Chest 2 View  Result Date: 11/04/2018 CLINICAL DATA:  Cough, weakness. EXAM: CHEST - 2  VIEW COMPARISON:  Radiograph September 30, 2018. FINDINGS: Stable cardiomediastinal silhouette. No pneumothorax is noted. Status post bilateral shoulder arthroplasties. Atherosclerosis of thoracic aorta  is noted. Right lung is clear. Minimal left basilar subsegmental atelectasis is noted with small left pleural effusion. Bony thorax is unremarkable. IMPRESSION: Minimal left basilar subsegmental atelectasis with small left pleural effusion. Electronically Signed   By: Marijo Conception, M.D.   On: 11/04/2018 13:35   Ct Head Wo Contrast  Result Date: 11/04/2018 CLINICAL DATA:  Generalized weakness, cough for a month, syncopal episode today EXAM: CT HEAD WITHOUT CONTRAST TECHNIQUE: Contiguous axial images were obtained from the base of the skull through the vertex without intravenous contrast. COMPARISON:  MR brain of 06/06/2017 and CT brain scan of 08/02/2012 FINDINGS: Brain: The ventricular system remains prominent as are the cortical sulci, indicative of diffuse atrophy. There has been some progression of moderate small vessel ischemic change throughout the periventricular white matter. However, no hemorrhage, mass lesion, or acute infarction is seen. Vascular: No vascular abnormality is noted on this unenhanced study. Skull: On bone window images, no calvarial abnormality is seen. Sinuses/Orbits: There is evidence of ethmoid sinus disease with mucosal thickening and a small amount of fluid. Also there are apparent retention cysts in the maxillary sinuses right more numerous and larger than left. No air-fluid level is seen within the maxillary sinuses. Other: None. IMPRESSION: 1. Progression of small vessel ischemic change throughout the periventricular white matter since the prior CT of 2013. No acute intracranial abnormality. 2. Stable diffuse atrophy. 3. Ethmoid sinus disease with probable retention cysts in the maxillary sinuses right greater than left. Electronically Signed   By: Ivar Drape M.D.   On:  11/04/2018 11:56    Procedures Procedures (including critical care time)  Medications Ordered in ED Medications  sodium chloride 0.9 % bolus 500 mL (0 mLs Intravenous Stopped 11/04/18 1325)     Initial Impression / Assessment and Plan / ED Course  I have reviewed the triage vital signs and the nursing notes.  Pertinent labs & imaging results that were available during my care of the patient were reviewed by me and considered in my medical decision making (see chart for details).    Patient presents after general weakness and syncopal episode.  Patient at baseline currently no significant symptoms.  Patient is describing orthostatic symptoms.  Plan for IV and oral fluids.  Basic blood work revealed chronic anemia hemoglobin of 9, no significant leukocytosis, no fever in the ER.  Patient did have mild headache with syncope CT scan of the head no acute findings reviewed.  EKG overall similar to previous.  With age, syncope likely related to dehydration/orthostatic however patient will need further work-up which may include echo/Holter monitor.  Plan ambulate in the ER if patient does well she will follow-up with cardiology and primary doctor this week. UA pending. Discussed fup for anemia and continued Iron.  Pt improved on reassessment.  Discussed results and fup. Tolerating po. Ambulated.  Results and differential diagnosis were discussed with the patient/parent/guardian. Xrays were independently reviewed by myself.  Close follow up outpatient was discussed, comfortable with the plan.   Medications  sodium chloride 0.9 % bolus 500 mL (0 mLs Intravenous Stopped 11/04/18 1325)    Vitals:   11/04/18 1130 11/04/18 1145 11/04/18 1200 11/04/18 1230  BP: 128/73  (!) 142/78 130/80  Pulse: 90 95    Resp: 17 16 17 16   Temp:      TempSrc:      SpO2: 96% 98%    Weight:      Height:        Final diagnoses:  Syncope and collapse  Orthostatic dizziness  Other iron deficiency anemia      Final Clinical Impressions(s) / ED Diagnoses   Final diagnoses:  Syncope and collapse  Orthostatic dizziness  Other iron deficiency anemia    ED Discharge Orders    None       Elnora Morrison, MD 11/04/18 1356    Elnora Morrison, MD 11/04/18 1413

## 2018-11-04 NOTE — ED Notes (Signed)
Pt returned from xray

## 2018-11-04 NOTE — ED Triage Notes (Signed)
EMS reports pt has had generalized weakness and cough x 1 month.  Reports is supposed to have her hemoglobin checked this morning.  Today pt was sitting on a stool with her son and she passed out.  Reports her son caught her so she didn't fall.  EMS says pt had a syncopal episode 3 weeks ago as well.  Pt c/o headache.

## 2018-11-04 NOTE — Discharge Instructions (Signed)
Stay well-hydrated, follow-up closely with primary doctor and/or cardiology for further work-up which may include ultrasound of the heart or monitor.  Return for chest pain, shortness of breath, recurrent seizures or syncope. If you feel lightheaded please stay seated or lying down and call for help.  If you were given medicines take as directed.  If you are on coumadin or contraceptives realize their levels and effectiveness is altered by many different medicines.  If you have any reaction (rash, tongues swelling, other) to the medicines stop taking and see a physician.    If your blood pressure was elevated in the ER make sure you follow up for management with a primary doctor or return for chest pain, shortness of breath or stroke symptoms.  Please follow up as directed and return to the ER or see a physician for new or worsening symptoms.  Thank you. Vitals:   11/04/18 1130 11/04/18 1145 11/04/18 1200 11/04/18 1230  BP: 128/73  (!) 142/78 130/80  Pulse: 90 95    Resp: 17 16 17 16   Temp:      TempSrc:      SpO2: 96% 98%    Weight:      Height:

## 2018-11-04 NOTE — ED Notes (Signed)
ED Provider at bedside. 

## 2018-11-05 ENCOUNTER — Other Ambulatory Visit (HOSPITAL_COMMUNITY): Payer: Medicare Other

## 2018-11-06 ENCOUNTER — Other Ambulatory Visit (HOSPITAL_COMMUNITY): Payer: Self-pay

## 2018-11-06 ENCOUNTER — Telehealth: Payer: Self-pay | Admitting: Gastroenterology

## 2018-11-06 ENCOUNTER — Ambulatory Visit (HOSPITAL_COMMUNITY): Payer: Medicare Other | Admitting: Internal Medicine

## 2018-11-06 DIAGNOSIS — D472 Monoclonal gammopathy: Secondary | ICD-10-CM

## 2018-11-06 NOTE — Telephone Encounter (Signed)
Pt said she went to the ED on 11/04/2018 for syncope. Said she has passed out a couple of times recently. She is having a lot of weak spells.  Her hemoglobin is at a 9 now and she said a month ago it was 10. She feels like she is losing blood somewhere, but it is not in her BM's. Last seen here on 02/06/2018 by Dr. Oneida Alar. Pt said her son is with her now. She is aware to go to ED if she worsens before she hears back from me. Dr. Oneida Alar, please advise!

## 2018-11-06 NOTE — Telephone Encounter (Signed)
[phone] patient called and said her hemoglobin is 9 and she is passing out.  She thinks she is bleeding from somewhere.

## 2018-11-06 NOTE — Telephone Encounter (Signed)
Pt is aware.  

## 2018-11-06 NOTE — Telephone Encounter (Signed)
PLEASE CALL PT. SHE HAD A BLOOD DISORDER CALLED [facility] AND IF SHE IS HAVING TROUBLE WITH ANEMIA BUT SEEING NO RECTAL BLEEDING OR MELENA, SHE SHOULD SEE HER HEMATOLOGY DOCTOR. GHER BLOOD COUNT HAS BEEN 9.2-11 SINCE 2016. A Hb 9.0 SHOULDNOT MAKE HER PASS OUT. SHE SHOULD SEE HER PCP TO DISCUSS A CARDIOLOGY REFERRAL.

## 2018-11-07 ENCOUNTER — Ambulatory Visit (HOSPITAL_COMMUNITY): Payer: Medicare Other | Admitting: Internal Medicine

## 2018-11-07 ENCOUNTER — Inpatient Hospital Stay (HOSPITAL_COMMUNITY): Payer: Medicare Other | Attending: Hematology

## 2018-11-07 DIAGNOSIS — D472 Monoclonal gammopathy: Secondary | ICD-10-CM | POA: Diagnosis not present

## 2018-11-07 DIAGNOSIS — F411 Generalized anxiety disorder: Secondary | ICD-10-CM | POA: Diagnosis not present

## 2018-11-07 DIAGNOSIS — D631 Anemia in chronic kidney disease: Secondary | ICD-10-CM | POA: Diagnosis not present

## 2018-11-07 DIAGNOSIS — R61 Generalized hyperhidrosis: Secondary | ICD-10-CM | POA: Insufficient documentation

## 2018-11-07 DIAGNOSIS — R1319 Other dysphagia: Secondary | ICD-10-CM | POA: Diagnosis not present

## 2018-11-07 DIAGNOSIS — D649 Anemia, unspecified: Secondary | ICD-10-CM | POA: Insufficient documentation

## 2018-11-07 DIAGNOSIS — Z87891 Personal history of nicotine dependence: Secondary | ICD-10-CM | POA: Insufficient documentation

## 2018-11-07 DIAGNOSIS — E039 Hypothyroidism, unspecified: Secondary | ICD-10-CM | POA: Diagnosis not present

## 2018-11-07 DIAGNOSIS — I10 Essential (primary) hypertension: Secondary | ICD-10-CM | POA: Insufficient documentation

## 2018-11-07 DIAGNOSIS — R5383 Other fatigue: Secondary | ICD-10-CM | POA: Diagnosis not present

## 2018-11-07 DIAGNOSIS — D519 Vitamin B12 deficiency anemia, unspecified: Secondary | ICD-10-CM | POA: Diagnosis not present

## 2018-11-07 DIAGNOSIS — Z6820 Body mass index (BMI) 20.0-20.9, adult: Secondary | ICD-10-CM | POA: Diagnosis not present

## 2018-11-07 DIAGNOSIS — R319 Hematuria, unspecified: Secondary | ICD-10-CM | POA: Diagnosis not present

## 2018-11-07 DIAGNOSIS — R531 Weakness: Secondary | ICD-10-CM | POA: Diagnosis not present

## 2018-11-07 DIAGNOSIS — E46 Unspecified protein-calorie malnutrition: Secondary | ICD-10-CM | POA: Diagnosis not present

## 2018-11-07 DIAGNOSIS — J302 Other seasonal allergic rhinitis: Secondary | ICD-10-CM | POA: Diagnosis not present

## 2018-11-07 LAB — COMPREHENSIVE METABOLIC PANEL
ALK PHOS: 70 U/L (ref 38–126)
ALT: 13 U/L (ref 0–44)
AST: 23 U/L (ref 15–41)
Albumin: 3.3 g/dL — ABNORMAL LOW (ref 3.5–5.0)
Anion gap: 9 (ref 5–15)
BILIRUBIN TOTAL: 0.3 mg/dL (ref 0.3–1.2)
BUN: 19 mg/dL (ref 8–23)
CALCIUM: 9.2 mg/dL (ref 8.9–10.3)
CO2: 26 mmol/L (ref 22–32)
Chloride: 103 mmol/L (ref 98–111)
Creatinine, Ser: 0.9 mg/dL (ref 0.44–1.00)
GFR calc Af Amer: >60 mL/min (ref >60)
GFR, EST NON AFRICAN AMERICAN: 56 mL/min — AB (ref >60)
GLUCOSE: 90 mg/dL (ref 70–99)
POTASSIUM: 3.6 mmol/L (ref 3.5–5.1)
Sodium: 138 mmol/L (ref 135–145)
TOTAL PROTEIN: 7.5 g/dL (ref 6.5–8.1)

## 2018-11-07 LAB — CBC WITH DIFFERENTIAL/PLATELET
ABS IMMATURE GRANULOCYTES: 0.05 10*3/uL (ref 0.00–0.07)
Basophils Absolute: 0.1 10*3/uL (ref 0.0–0.1)
Basophils Relative: 0 %
Eosinophils Absolute: 0.4 10*3/uL (ref 0.0–0.5)
Eosinophils Relative: 3 %
HEMATOCRIT: 30.7 % — AB (ref 36.0–46.0)
HEMOGLOBIN: 9.3 g/dL — AB (ref 12.0–15.0)
IMMATURE GRANULOCYTES: 0 %
LYMPHS ABS: 0.9 10*3/uL (ref 0.7–4.0)
LYMPHS PCT: 7 %
MCH: 26.6 pg (ref 26.0–34.0)
MCHC: 30.3 g/dL (ref 30.0–36.0)
MCV: 88 fL (ref 80.0–100.0)
MONO ABS: 0.9 10*3/uL (ref 0.1–1.0)
Monocytes Relative: 7 %
NEUTROS ABS: 10.2 10*3/uL — AB (ref 1.7–7.7)
NEUTROS PCT: 83 %
PLATELETS: 281 10*3/uL (ref 150–400)
RBC: 3.49 MIL/uL — ABNORMAL LOW (ref 3.87–5.11)
RDW: 15.2 % (ref 11.5–15.5)
WBC: 12.5 10*3/uL — ABNORMAL HIGH (ref 4.0–10.5)
nRBC: 0 % (ref 0.0–0.2)

## 2018-11-07 LAB — LACTATE DEHYDROGENASE: LDH: 138 U/L (ref 98–192)

## 2018-11-08 LAB — IGG, IGA, IGM
IGG (IMMUNOGLOBIN G), SERUM: 1191 mg/dL (ref 700–1600)
IgA: 460 mg/dL — ABNORMAL HIGH (ref 64–422)
IgM (Immunoglobulin M), Srm: 167 mg/dL (ref 26–217)

## 2018-11-10 LAB — PROTEIN ELECTROPHORESIS, SERUM
A/G Ratio: 0.9 (ref 0.7–1.7)
Albumin ELP: 3.2 g/dL (ref 2.9–4.4)
Alpha-1-Globulin: 0.4 g/dL (ref 0.0–0.4)
Alpha-2-Globulin: 1.1 g/dL — ABNORMAL HIGH (ref 0.4–1.0)
Beta Globulin: 1 g/dL (ref 0.7–1.3)
GAMMA GLOBULIN: 1.1 g/dL (ref 0.4–1.8)
GLOBULIN, TOTAL: 3.5 g/dL (ref 2.2–3.9)
Total Protein ELP: 6.7 g/dL (ref 6.0–8.5)

## 2018-11-10 LAB — KAPPA/LAMBDA LIGHT CHAINS
KAPPA FREE LGHT CHN: 41.3 mg/L — AB (ref 3.3–19.4)
KAPPA, LAMDA LIGHT CHAIN RATIO: 1.63 (ref 0.26–1.65)
Lambda free light chains: 25.4 mg/L (ref 5.7–26.3)

## 2018-11-11 ENCOUNTER — Ambulatory Visit (HOSPITAL_COMMUNITY): Payer: Medicare Other | Admitting: Internal Medicine

## 2018-11-13 ENCOUNTER — Encounter (HOSPITAL_COMMUNITY): Payer: Self-pay | Admitting: Internal Medicine

## 2018-11-13 ENCOUNTER — Inpatient Hospital Stay (HOSPITAL_BASED_OUTPATIENT_CLINIC_OR_DEPARTMENT_OTHER): Payer: Medicare Other | Admitting: Internal Medicine

## 2018-11-13 VITALS — BP 108/56 | HR 99 | Temp 98.3°F | Resp 18 | Wt 110.5 lb

## 2018-11-13 DIAGNOSIS — I10 Essential (primary) hypertension: Secondary | ICD-10-CM | POA: Diagnosis not present

## 2018-11-13 DIAGNOSIS — R1084 Generalized abdominal pain: Secondary | ICD-10-CM

## 2018-11-13 DIAGNOSIS — D472 Monoclonal gammopathy: Secondary | ICD-10-CM

## 2018-11-13 DIAGNOSIS — R1319 Other dysphagia: Secondary | ICD-10-CM | POA: Diagnosis not present

## 2018-11-13 DIAGNOSIS — R634 Abnormal weight loss: Secondary | ICD-10-CM

## 2018-11-13 DIAGNOSIS — Z87891 Personal history of nicotine dependence: Secondary | ICD-10-CM | POA: Diagnosis not present

## 2018-11-13 DIAGNOSIS — R61 Generalized hyperhidrosis: Secondary | ICD-10-CM

## 2018-11-13 DIAGNOSIS — E039 Hypothyroidism, unspecified: Secondary | ICD-10-CM

## 2018-11-13 DIAGNOSIS — D649 Anemia, unspecified: Secondary | ICD-10-CM

## 2018-11-13 DIAGNOSIS — R0602 Shortness of breath: Secondary | ICD-10-CM

## 2018-11-13 NOTE — Progress Notes (Signed)
Diagnosis  Plan:  CT neck  Staging Cancer Staging No matching staging information was found for the patient.  Assessment and Plan:  1. Monoclonal gammopathy of undetermined significance: IgG monoclonal protein with kappa light chain specificity on immunofixation. SPEP demonstrated 0.2 g/dL spike on labs done June 2018.Marland Kitchen SPEP done October 2018 showed no M spike.    Labs done 11/07/2018 reviewed and showed WBC 12.5 HB 9.3 plts 281,000.  Chemistries WNL with K+ 3.6 Cr 0.90 and normal LFTS.  Calcium WNL at 9.2.  SPEP negative.  Quantitative IG WNL.  K/L light chain ratio WNL at 1.63.    Pt is reporting various complaints and was recently seen in ER for blackout spells.  She is complaining of trouble swallowing, night sweats, abdominal pain, and SOB.  Pt had CT angio of chest and CT abdomen and pelvis done 09/30/2018 that was reviewed and showed IMPRESSION: CHEST CTA  1. No evidence of a pulmonary embolism. 2. Right greater than left pleuroparenchymal opacity that is most consistent with scarring. 3. Subtle peribronchovascular hazy opacities in the lower lobes likely scarring or atelectasis. Infection is possible and should be considered in the proper clinical setting. No other evidence of acute abnormality in the chest. 4. Aortic atherosclerosis.  ABDOMEN AND PELVIS CT  1. No acute findings within the abdomen or pelvis. No findings to account for abdominal pain. 2. Aortic atherosclerosis.  Pt is complaining of trouble swallowing and she will be set up for CT of neck.  She will RTC in 3 weeks to go over scan.  Pt continues to show no evidence of monoclonal protein, RI, elevated Calcium.    2.  Trouble swallowing.   PT is set up for CT of neck.  If negative, she will be referred to GI for evaluation.    3.  Blackout spells.  Pt is referred to neurology for evaluation. She was recently seen in ER and had CT brain done 11/04/2018 that showed ischemic changes and atrophy.    4.  Anemia.   HB decreased at 9.3 compared to labs done 03/2018 when HB was 10.9.  Pt will have repeat labs in 3 weeks with CBC, CMP, LDH, ferritin,folate, haptoglobin , MMA, B12.    5.  Hypertension.  Blood pressure is 108/56.  Follow with PCP.  6.  Hypothyroidism.  Patient is on Synthroid.  Follow-up with PCP for monitoring.  7.  Joint pain.  She has a diagnosis of osteoarthritis.  She has undergone knee replacements in the past.  8.  Night sweats.  Recent Scans done 09/30/2018 showed no adenopathy.  Pt showed lung scarring.  She has a normal LDH.  Follow-up with PCP if ongoing symptoms.    Greater than 25 minutes spent with more than 50% spent in counseling and coordination of care.    Interval History:  84 y.o. female previously followed by Dr. Talbert Cage for monoclonal gammopathy. Patient was seen her neurologist who checked a myeloma panel which demonstrated that patient had IgG monoclonal protein with kappa light chain specificity on immunofixation. SPEP demonstrated 0.2 g/dL spike. CMP from 05/21/17 demonstrated creatinine 0.76, calcium 9.2. Pt has remained on observation.    Current Status: Patient is seen today for follow-up to go over labs.  She was seen in ER due to blackout spells.  She is complaining of night sweats.    Problem List Patient Active Problem List   Diagnosis Date Noted  . Neck pain [M54.2]   . Chest pain [R07.9] 09/30/2018  .  Generalized weakness [R53.1] 09/30/2018  . Hypokalemia [E87.6]   . MGUS (monoclonal gammopathy of unknown significance) [D47.2] 10/21/2017  . Primary localized osteoarthrosis of right shoulder [M19.011] 06/21/2015  . S/P shoulder replacement [Z96.619] 06/21/2015  . Osteoarthritis of right knee [M17.11] 07/27/2014  . 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 Syncope and collapse [R55]   . Anemia, normocytic normochromic [D64.9] 04/26/2012  . Gastroesophageal reflux disease [K21.9]   . Hypertension [I10] 12/07/2011  . History of TIAs [Z86.73]  12/07/2011  . Leg pain, bilateral [W09.811, M79.605] 04/27/2011  . Hyperlipidemia [E78.5] 04/27/2011  . Other dysphagia [R13.19] 06/07/2010    Past Medical History Past Medical History:  Diagnosis Date  . Anemia   . Asthma   . Cancer ([place])    skin Ca- ? basal cell   . Chronic back pain   . Chronic neck pain   . Degenerative joint disease    Left shoulder; cervical spine, knees & hands   . Gastroesophageal reflux disease    Hiatal hernia; distal esophageal web requiring dilatation; gastric polyps; gastritis; refuses colonoscopy  . GERD (gastroesophageal reflux disease)   . History of stress test 1990's   stress test done under the care of Dr. Lattie Haw & Dr. Gwenlyn Found, now being followed by Dr. Harl Bowie- in [place] , recently seen & told to f/U in one yr.   . Hyperlipidemia 04/27/2011  . Hypertension   . Hypothyroidism   . Lymphocytic thyroiditis   . Multiple thyroid nodules 2010   Adenomatous; thyroidectomy in 2010  . Osteoarthritis of right knee 07/27/2014  . Pneumonia    hosp. for pneumonia- long time ago   . Primary localized osteoarthrosis of right shoulder 06/21/2015  . Seizures ([place])    yes- as a child- & into adult years, states she took med. for them at one time, stopped at 32 yrs. of age   . Syncope and collapse 2007   Possible CVA in 2012 with left lower extremity weakness; refused hospitalization; CT-Atrophy and chronic microvascular ischemic change.     Past Surgical History Past Surgical History:  Procedure Laterality Date  . ABDOMINAL HYSTERECTOMY     fibroids  . CATARACT EXTRACTION     Bilateral; redo surgery on the right for incomplete primary procedure  . COLONOSCOPY  Remote  . ESOPHAGOGASTRODUODENOSCOPY  12/10   Dr. 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 ESOPHAGOGASTRODUODENOSCOPY N/A 03/21/2015   Procedure: ESOPHAGOGASTRODUODENOSCOPY (EGD);  Surgeon: Danie Binder, MD;  [facility];   Service: Endoscopy;  Laterality: N/A;  1130   . EYE SURGERY    . PROLAPSED UTERINE FIBROID LIGATION     outcomed with rectocele & cystocele  . SAVORY DILATION N/A 03/21/2015   Procedure: SAVORY DILATION;  Surgeon: Danie Binder, MD;  [facility];  Service: Endoscopy;  Laterality: N/A;  . SHOULDER SURGERY     left  . TOTAL KNEE ARTHROPLASTY Right 07/27/2014   Procedure: RIGHT TOTAL KNEE ARTHROPLASTY;  Surgeon: Johnny Bridge, MD;  Location: [place];  Service: Orthopedics;  Laterality: Right;  . TOTAL SHOULDER ARTHROPLASTY Right 06/21/2015   Procedure: RIGHT TOTAL SHOULDER ARTHROPLASTY;  Surgeon: Marchia Bond, MD;  Location: [place];  Service: Orthopedics;  Laterality: Right;  . TOTAL THYROIDECTOMY  2010    Family History Family History  Problem Relation Age of Onset  . Heart disease Mother   . Gallbladder disease Mother   . Heart failure Mother   . Breast cancer Sister  30 years ago  . Heart attack Father   . Colon cancer Neg Hx   . Stroke Neg Hx      Social History  reports that she quit smoking about 39 years ago. Her smoking use included cigarettes. She has a 16.00 pack-year smoking history. She has never used smokeless tobacco. She reports that she does not drink alcohol or use drugs.  Medications  Current Outpatient Medications:  .  albuterol (PROVENTIL HFA) 108 (90 BASE) MCG/ACT inhaler, Inhale 1-2 puffs into the lungs every 6 (six) hours as needed for wheezing or shortness of breath. Wheezing, Asthma Symptoms, Disp: , Rfl:  .  aspirin 81 MG tablet, Take 81 mg by mouth daily., Disp: , Rfl:  .  benzonatate (TESSALON) 200 MG capsule, Take 200 mg by mouth 2 (two) times daily as needed. , Disp: , Rfl:  .  calcium-vitamin D (OSCAL WITH D) 500-200 MG-UNIT per tablet, Take 1 tablet by mouth daily. , Disp: , Rfl:  .  diazepam (VALIUM) 5 MG tablet, Take one tablet by mouth at bedtime as needed for sleep (Patient taking differently: Take 2.5 mg by mouth at bedtime as  needed (sleep). ), Disp: 30 tablet, Rfl: 5 .  EPINEPHrine 0.3 mg/0.3 mL IJ SOAJ injection, Inject into the muscle as needed for anaphylaxis., Disp: , Rfl:  .  Glucosamine HCl (GLUCOSAMINE PO), Take 1 tablet by mouth 2 (two) times daily., Disp: , Rfl:  .  ipratropium (ATROVENT) 0.03 % nasal spray, Place 2 sprays into both nostrils daily as needed (allergies stuffy nose). , Disp: , Rfl:  .  levocetirizine (XYZAL) 5 MG tablet, Take 5 mg by mouth every evening.  , Disp: , Rfl:  .  levothyroxine (SYNTHROID, LEVOTHROID) 75 MCG tablet, Take 1 tablet (75 mcg total) by mouth daily., Disp: 90 tablet, Rfl: 0 .  montelukast (SINGULAIR) 10 MG tablet, Take 10 mg by mouth at bedtime., Disp: , Rfl:  .  omeprazole (PRILOSEC) 20 MG capsule, 1 PO 30 mins prior to breakfast and supper (Patient taking differently: Take 20 mg by mouth daily. 1 PO 30 mins prior to breakfast and supper), Disp: 60 capsule, Rfl: 5 .  pravastatin (PRAVACHOL) 80 MG tablet, Take 1 tablet (80 mg total) by mouth every evening., Disp: 90 tablet, Rfl: 3 .  senna (SENOKOT) 8.6 MG tablet, Take 1 tablet by mouth daily., Disp: , Rfl:  .  sulfamethoxazole-trimethoprim (BACTRIM,SEPTRA) 400-80 MG tablet, , Disp: , Rfl:  .  vitamin B-12 (CYANOCOBALAMIN) 1000 MCG tablet, Take 1,000 mcg by mouth every morning., Disp: , Rfl:  .  vitamin C (ASCORBIC ACID) 500 MG tablet, Take 500 mg by mouth daily., Disp: , Rfl:  .  vitamin E 400 UNIT capsule, Take 400 Units by mouth at bedtime. , Disp: , Rfl:   Allergies Celecoxib; Dexilant [dexlansoprazole]; and Famotidine  Review of Systems Review of Systems - Oncology ROS negative other than trouble swallowing, night sweats and blackout spells.     Physical Exam  Vitals Wt Readings from Last 3 Encounters:  11/13/18 110 lb 8 oz (50.1 kg)  11/04/18 113 lb (51.3 kg)  10/01/18 116 lb 13.5 oz (53 kg)   Temp Readings from Last 3 Encounters:  11/13/18 98.3 F (36.8 C) (Oral)  11/04/18 98.2 F (36.8 C) (Oral)   10/01/18 98.7 F (37.1 C) (Oral)   BP Readings from Last 3 Encounters:  11/13/18 (!) 108/56  11/04/18 (!) 144/88  10/01/18 135/78   Pulse Readings from Last 3 Encounters:  11/13/18 99  11/04/18 98  10/01/18 (!) 101    Constitutional: elderly female seated in wheel chair in no distress.   HENT: Head: Normocephalic and atraumatic.  Mouth/Throat: No oropharyngeal exudate. Mucosa moist. Eyes: Pupils are equal, round, and reactive to light. Conjunctivae are normal. No scleral icterus.  Neck: Normal range of motion. Neck supple. No JVD present.  Cardiovascular: Normal rate, regular rhythm and normal heart sounds.  Exam reveals no gallop and no friction rub.   No murmur heard. Pulmonary/Chest: Effort normal and breath sounds normal. No respiratory distress. No wheezes.No rales.  Abdominal: Soft. Bowel sounds are normal. No distension. There is no tenderness. There is no guarding.  Musculoskeletal: No edema or tenderness.  Lymphadenopathy: No cervical, axillary or supraclavicular adenopathy.  Neurological: Alert and oriented to person, place, and time. No cranial nerve deficit.  Skin: Skin is warm and dry. No rash noted. No erythema. No pallor.  Psychiatric: Affect and judgment normal.   Labs No visits with results within 3 Day(s) from this visit.  Latest known visit with results is:  Appointment on 11/07/2018  Component Date Value Ref Range Status  . WBC 11/07/2018 12.5* 4.0 - 10.5 K/uL Final  . RBC 11/07/2018 3.49* 3.87 - 5.11 MIL/uL Final  . Hemoglobin 11/07/2018 9.3* 12.0 - 15.0 g/dL Final  . HCT 11/07/2018 30.7* 36.0 - 46.0 % Final  . MCV 11/07/2018 88.0  80.0 - 100.0 fL Final  . MCH 11/07/2018 26.6  26.0 - 34.0 pg Final  . MCHC 11/07/2018 30.3  30.0 - 36.0 g/dL Final  . RDW 11/07/2018 15.2  11.5 - 15.5 % Final  . Platelets 11/07/2018 281  150 - 400 K/uL Final  . nRBC 11/07/2018 0.0  0.0 - 0.2 % Final  . Neutrophils Relative % 11/07/2018 83  % Final  . Neutro Abs  11/07/2018 10.2* 1.7 - 7.7 K/uL Final  . Lymphocytes Relative 11/07/2018 7  % Final  . Lymphs Abs 11/07/2018 0.9  0.7 - 4.0 K/uL Final  . Monocytes Relative 11/07/2018 7  % Final  . Monocytes Absolute 11/07/2018 0.9  0.1 - 1.0 K/uL Final  . Eosinophils Relative 11/07/2018 3  % Final  . Eosinophils Absolute 11/07/2018 0.4  0.0 - 0.5 K/uL Final  . Basophils Relative 11/07/2018 0  % Final  . Basophils Absolute 11/07/2018 0.1  0.0 - 0.1 K/uL Final  . Immature Granulocytes 11/07/2018 0  % Final  . Abs Immature Granulocytes 11/07/2018 0.05  0.00 - 0.07 K/uL Final   Performed at [facility], [address] [place], [place] [address]  . Sodium 11/07/2018 138  135 - 145 mmol/L Final  . Potassium 11/07/2018 3.6  3.5 - 5.1 mmol/L Final  . Chloride 11/07/2018 103  98 - 111 mmol/L Final  . CO2 11/07/2018 26  22 - 32 mmol/L Final  . Glucose, Bld 11/07/2018 90  70 - 99 mg/dL Final  . BUN 11/07/2018 19  8 - 23 mg/dL Final  . Creatinine, Ser 11/07/2018 0.90  0.44 - 1.00 mg/dL Final  . Calcium 11/07/2018 9.2  8.9 - 10.3 mg/dL Final  . Total Protein 11/07/2018 7.5  6.5 - 8.1 g/dL Final  . Albumin 11/07/2018 3.3* 3.5 - 5.0 g/dL Final  . AST 11/07/2018 23  15 - 41 U/L Final  . ALT 11/07/2018 13  0 - 44 U/L Final  . Alkaline Phosphatase 11/07/2018 70  38 - 126 U/L Final  . Total Bilirubin 11/07/2018 0.3  0.3 - 1.2 mg/dL Final  .  GFR calc non Af Amer 11/07/2018 56* >60 mL/min Final  . GFR calc Af Amer 11/07/2018 >60  >60 mL/min Final   Comment: (NOTE) The eGFR has been calculated using the CKD EPI equation. This calculation has not been validated in all clinical situations. eGFR's persistently <60 mL/min signify possible Chronic Kidney Disease.   Georgiann Hahn gap 11/07/2018 9  5 - 15 Final   Performed at [facility], [address] [place], [place] [address]  . IgG (Immunoglobin G), Serum 11/07/2018 1,191  700 - 1,600 mg/dL Final  . IgA 11/07/2018 460* 64 - 422 mg/dL Final  . IgM (Immunoglobulin M), Srm  11/07/2018 167  26 - 217 mg/dL Final   Comment: (NOTE) Performed At: [facility] [address] [place], [place] [id number] Rush Farmer MD [id number]   . Kappa free light chain 11/07/2018 41.3* 3.3 - 19.4 mg/L Final  . Lamda free light chains 11/07/2018 25.4  5.7 - 26.3 mg/L Final  . Kappa, lamda light chain ratio 11/07/2018 1.63  0.26 - 1.65 Final   Comment: (NOTE) Performed At: [facility] [place], [place] [id number] Rush Farmer MD [id number]   . LDH 11/07/2018 138  98 - 192 U/L Final   Performed at [facility], [address], [place], [place] [address]  . Total Protein ELP 11/07/2018 6.7  6.0 - 8.5 g/dL Final  . Albumin ELP 11/07/2018 3.2  2.9 - 4.4 g/dL Final  . Alpha-1-Globulin 11/07/2018 0.4  0.0 - 0.4 g/dL Final  . Alpha-2-Globulin 11/07/2018 1.1* 0.4 - 1.0 g/dL Final  . Beta Globulin 11/07/2018 1.0  0.7 - 1.3 g/dL Final  . Gamma Globulin 11/07/2018 1.1  0.4 - 1.8 g/dL Final  . M-Spike, % 11/07/2018 Not Observed  Not Observed g/dL Final  . SPE Interp. 11/07/2018 Comment   Final   Comment: (NOTE) The SPE pattern is suggestive of a subacute inflammatory response. This condition represents an intermediate stage between two possible courses for acute inflammation: total convalescense with a return to normal, or the onset of a chronic inflammatory condition. Performed At: [facility] [place], [place] [id number] Rush Farmer MD [id number]   . Comment 11/07/2018 Comment   Final   Comment: (NOTE) Protein electrophoresis scan will follow via computer, mail, or courier delivery.   Marland Kitchen GLOBULIN, TOTAL 11/07/2018 3.5  2.2 - 3.9 g/dL Corrected  . A/G Ratio 11/07/2018 0.9  0.7 - 1.7 Corrected     Pathology Orders Placed This Encounter  Procedures  . CT SOFT TISSUE NECK W CONTRAST    Standing Status:   Future    Standing Expiration Date:   02/14/2020    Order Specific Question:   If indicated for the  ordered procedure, I authorize the administration of contrast media per Radiology protocol    Answer:   Yes    Order Specific Question:   Preferred imaging location?    Answer:   [facility]    Order Specific Question:   Radiology Contrast Protocol - do NOT remove file path    Answer:   \\[email or web address]  . CT CHEST W CONTRAST    Standing Status:   Future    Standing Expiration Date:   11/13/2019    Order Specific Question:   If indicated for the ordered procedure, I authorize the administration of contrast media per Radiology protocol    Answer:   Yes    Order Specific Question:   Preferred imaging location?  Answer:   [facility]    Order Specific Question:   Radiology Contrast Protocol - do NOT remove file path    Answer:   \\[email or web address]  . CT ABDOMEN PELVIS W CONTRAST    Standing Status:   Future    Standing Expiration Date:   11/13/2019    Order Specific Question:   If indicated for the ordered procedure, I authorize the administration of contrast media per Radiology protocol    Answer:   Yes    Order Specific Question:   Preferred imaging location?    Answer:   [facility]    Order Specific Question:   Is Oral Contrast requested for this exam?    Answer:   Yes, Per Radiology protocol    Order Specific Question:   Radiology Contrast Protocol - do NOT remove file path    Answer:   \\[email or web address]  . CBC with Differential/Platelet    Standing Status:   Future    Standing Expiration Date:   12/14/2019  . Comprehensive metabolic panel    Standing Status:   Future    Standing Expiration Date:   12/14/2019  . Lactate dehydrogenase    Standing Status:   Future    Standing Expiration Date:   12/14/2019  . Protein electrophoresis, serum    Standing Status:   Future    Standing Expiration Date:   12/14/2019  . Ferritin    Standing Status:   Future    Standing Expiration  Date:   12/14/2019  . Vitamin B12    Standing Status:   Future    Standing Expiration Date:   12/14/2019  . Folate    Standing Status:   Future    Standing Expiration Date:   12/14/2019  . Methylmalonic acid, serum    Standing Status:   Future    Standing Expiration Date:   12/14/2019  . Haptoglobin    Standing Status:   Future    Standing Expiration Date:   12/14/2019       Zoila Shutter MD

## 2018-11-16 ENCOUNTER — Encounter (HOSPITAL_COMMUNITY): Payer: Self-pay

## 2018-11-16 ENCOUNTER — Emergency Department (HOSPITAL_COMMUNITY)
Admission: EM | Admit: 2018-11-16 | Discharge: 2018-11-16 | Disposition: A | Discharge status: Home / Self Care | Payer: Medicare Other | Attending: Emergency Medicine | Admitting: Emergency Medicine

## 2018-11-16 ENCOUNTER — Other Ambulatory Visit: Payer: Self-pay

## 2018-11-16 DIAGNOSIS — Z96611 Presence of right artificial shoulder joint: Secondary | ICD-10-CM | POA: Insufficient documentation

## 2018-11-16 DIAGNOSIS — E039 Hypothyroidism, unspecified: Secondary | ICD-10-CM | POA: Insufficient documentation

## 2018-11-16 DIAGNOSIS — Z96651 Presence of right artificial knee joint: Secondary | ICD-10-CM | POA: Insufficient documentation

## 2018-11-16 DIAGNOSIS — Z8673 Personal history of transient ischemic attack (TIA), and cerebral infarction without residual deficits: Secondary | ICD-10-CM | POA: Insufficient documentation

## 2018-11-16 DIAGNOSIS — J45909 Unspecified asthma, uncomplicated: Secondary | ICD-10-CM | POA: Diagnosis not present

## 2018-11-16 DIAGNOSIS — Z87891 Personal history of nicotine dependence: Secondary | ICD-10-CM | POA: Insufficient documentation

## 2018-11-16 DIAGNOSIS — I10 Essential (primary) hypertension: Secondary | ICD-10-CM | POA: Diagnosis not present

## 2018-11-16 DIAGNOSIS — N390 Urinary tract infection, site not specified: Secondary | ICD-10-CM | POA: Diagnosis not present

## 2018-11-16 DIAGNOSIS — R319 Hematuria, unspecified: Secondary | ICD-10-CM | POA: Diagnosis not present

## 2018-11-16 DIAGNOSIS — R1030 Lower abdominal pain, unspecified: Secondary | ICD-10-CM | POA: Diagnosis present

## 2018-11-16 DIAGNOSIS — Z85828 Personal history of other malignant neoplasm of skin: Secondary | ICD-10-CM | POA: Diagnosis not present

## 2018-11-16 DIAGNOSIS — Z79899 Other long term (current) drug therapy: Secondary | ICD-10-CM | POA: Diagnosis not present

## 2018-11-16 DIAGNOSIS — Z7982 Long term (current) use of aspirin: Secondary | ICD-10-CM | POA: Diagnosis not present

## 2018-11-16 LAB — URINALYSIS, ROUTINE W REFLEX MICROSCOPIC
BILIRUBIN URINE: NEGATIVE
Glucose, UA: NEGATIVE mg/dL
Ketones, ur: NEGATIVE mg/dL
Nitrite: NEGATIVE
PROTEIN: 100 mg/dL — AB
RBC / HPF: >50 RBC/hpf — ABNORMAL HIGH (ref 0–5)
Specific Gravity, Urine: 1.016 (ref 1.005–1.030)
pH: 5 (ref 5.0–8.0)

## 2018-11-16 MED ORDER — CEPHALEXIN 500 MG PO CAPS
500.0000 mg | ORAL_CAPSULE | Freq: Once | ORAL | Status: AC
  Administered 2018-11-16: 500 mg via ORAL
  Filled 2018-11-16: qty 1

## 2018-11-16 MED ORDER — CEPHALEXIN 500 MG PO CAPS: 500.0000 mg | ORAL_CAPSULE | Freq: Three times a day (TID) | ORAL | 0 refills | Status: DC

## 2018-11-16 NOTE — ED Triage Notes (Signed)
Pt reports she has a busted blood vessel to right lower leg that she says came up this morning. Pt reports also having abdominal pain that started this morning. Pt says she has been treated for UTI since she was catherized in the ED. Pt has been weak

## 2018-11-16 NOTE — ED Provider Notes (Signed)
[facility] EMERGENCY DEPARTMENT Provider Note   CSN: [id number] Arrival date & time: 11/16/18  1151     History   Chief Complaint Chief Complaint  Patient presents with  . hematoma  . Abdominal Pain    HPI Christine Leblanc is a 84 y.o. female.  HPI   89yF with generalized weakness. She is concerned she may have uti. Mild lower abdominal pain. Urine cloudy and strong odor. No fever chills. No n/v. Also with small hematoma to R lower leg. Doesn't remember any discrete trauma. Does hurt.   Past Medical History:  Diagnosis Date  . Anemia   . Asthma   . Cancer ([place])    skin Ca- ? basal cell   . Chronic back pain   . Chronic neck pain   . Degenerative joint disease    Left shoulder; cervical spine, knees & hands   . Gastroesophageal reflux disease    Hiatal hernia; distal esophageal web requiring dilatation; gastric polyps; gastritis; refuses colonoscopy  . GERD (gastroesophageal reflux disease)   . History of stress test 1990's   stress test done under the care of Dr. Lattie Haw & Dr. Gwenlyn Found, now being followed by Dr. Harl Bowie- in [place] , recently seen & told to f/U in one yr.   . Hyperlipidemia 04/27/2011  . Hypertension   . Hypothyroidism   . Lymphocytic thyroiditis   . Multiple thyroid nodules 2010   Adenomatous; thyroidectomy in 2010  . Osteoarthritis of right knee 07/27/2014  . Pneumonia    hosp. for pneumonia- long time ago   . Primary localized osteoarthrosis of right shoulder 06/21/2015  . Seizures ([place])    yes- as a child- & into adult years, states she took med. for them at one time, stopped at 32 yrs. of age   . Syncope and collapse 2007   Possible CVA in 2012 with left lower extremity weakness; refused hospitalization; CT-Atrophy and chronic microvascular ischemic change.     Patient Active Problem List   Diagnosis Date Noted  . Neck pain   . Chest pain 09/30/2018  . Generalized weakness 09/30/2018  . Hypokalemia   . MGUS (monoclonal gammopathy of  unknown significance) 10/21/2017  . Primary localized osteoarthrosis of right shoulder 06/21/2015  . S/P shoulder replacement 06/21/2015  . Osteoarthritis of right knee 07/27/2014  . Postsurgical hypothyroidism 04/22/2014  . Syncope and collapse   . Anemia, normocytic normochromic 04/26/2012  . Gastroesophageal reflux disease   . Hypertension 12/07/2011  . History of TIAs 12/07/2011  . Leg pain, bilateral 04/27/2011  . Hyperlipidemia 04/27/2011  . Other dysphagia 06/07/2010    Past Surgical History:  Procedure Laterality Date  . ABDOMINAL HYSTERECTOMY     fibroids  . CATARACT EXTRACTION     Bilateral; redo surgery on the right for incomplete primary procedure  . COLONOSCOPY  Remote  . ESOPHAGOGASTRODUODENOSCOPY  12/10   Dr. 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 ESOPHAGOGASTRODUODENOSCOPY N/A 03/21/2015   Procedure: ESOPHAGOGASTRODUODENOSCOPY (EGD);  Surgeon: Danie Binder, MD;  Location: [facility];  Service: Endoscopy;  Laterality: N/A;  1130   . EYE SURGERY    . PROLAPSED UTERINE FIBROID LIGATION     outcomed with rectocele & cystocele  . SAVORY DILATION N/A 03/21/2015   Procedure: SAVORY DILATION;  Surgeon: Danie Binder, MD;  Location: [facility];  Service: Endoscopy;  Laterality: N/A;  . SHOULDER SURGERY     left  . TOTAL KNEE ARTHROPLASTY Right  07/27/2014   Procedure: RIGHT TOTAL KNEE ARTHROPLASTY;  Surgeon: Johnny Bridge, MD;  Location: [place];  Service: Orthopedics;  Laterality: Right;  . TOTAL SHOULDER ARTHROPLASTY Right 06/21/2015   Procedure: RIGHT TOTAL SHOULDER ARTHROPLASTY;  Surgeon: Marchia Bond, MD;  Location: [place];  Service: Orthopedics;  Laterality: Right;  . TOTAL THYROIDECTOMY  2010     OB History   None      Home Medications    Prior to Admission medications   Medication Sig Start Date End Date Taking? Authorizing Provider  albuterol (PROVENTIL HFA) 108 (90 BASE) MCG/ACT inhaler  Inhale 1-2 puffs into the lungs every 6 (six) hours as needed for wheezing or shortness of breath. Wheezing, Asthma Symptoms   Yes [provider]  aspirin 81 MG tablet Take 81 mg by mouth daily.   Yes [provider]  benzonatate (TESSALON) 200 MG capsule Take 200 mg by mouth 2 (two) times daily as needed.  10/30/18  Yes [provider]  calcium-vitamin D (OSCAL WITH D) 500-200 MG-UNIT per tablet Take 1 tablet by mouth daily.    Yes [provider]  diazepam (VALIUM) 5 MG tablet Take one tablet by mouth at bedtime as needed for sleep Patient taking differently: Take 2.5 mg by mouth at bedtime as needed (sleep).  08/04/14  Yes Estill Dooms, MD  EPINEPHrine 0.3 mg/0.3 mL IJ SOAJ injection Inject into the muscle as needed for anaphylaxis. 08/13/18  Yes [provider]  ferrous sulfate 325 (65 FE) MG tablet Take 325 mg by mouth daily with breakfast.   Yes [provider]  ipratropium (ATROVENT) 0.03 % nasal spray Place 2 sprays into both nostrils daily as needed (allergies stuffy nose).  04/25/17  Yes [provider]  levocetirizine (XYZAL) 5 MG tablet Take 5 mg by mouth every evening.     Yes [provider]  levothyroxine (SYNTHROID, LEVOTHROID) 75 MCG tablet Take 1 tablet (75 mcg total) by mouth daily. 09/22/18  Yes Elayne Snare, MD  montelukast (SINGULAIR) 10 MG tablet Take 10 mg by mouth at bedtime.   Yes [provider]  omeprazole (PRILOSEC) 20 MG capsule 1 PO 30 mins prior to breakfast and supper Patient taking differently: Take 20 mg by mouth daily. 1 PO 30 mins prior to breakfast and supper 10/13/18  Yes Annitta Needs, NP  pravastatin (PRAVACHOL) 80 MG tablet Take 1 tablet (80 mg total) by mouth every evening. 03/03/18  Yes Branch, Alphonse Guild, MD  senna (SENOKOT) 8.6 MG tablet Take 1 tablet by mouth daily.   Yes [provider]  vitamin B-12 (CYANOCOBALAMIN) 1000 MCG tablet Take 1,000 mcg by mouth every  morning.   Yes [provider]  vitamin C (ASCORBIC ACID) 500 MG tablet Take 500 mg by mouth daily.   Yes [provider]  vitamin E 400 UNIT capsule Take 400 Units by mouth at bedtime.    Yes [provider]    Family History Family History  Problem Relation Age of Onset  . Heart disease Mother   . Gallbladder disease Mother   . Heart failure Mother   . Breast cancer Sister        30 years ago  . Heart attack Father   . Colon cancer Neg Hx   . Stroke Neg Hx     Social History Social History   Tobacco Use  . Smoking status: Former Smoker    Packs/day: 0.80    Years: 20.00  Pack years: 16.00    Types: Cigarettes    Last attempt to quit: 12/31/1978    Years since quitting: 39.9  . Smokeless tobacco: Never Used  Substance Use Topics  . Alcohol use: No  . Drug use: No     Allergies   Celecoxib; Dexilant [dexlansoprazole]; and Famotidine   Review of Systems Review of Systems  All systems reviewed and negative, other than as noted in HPI.  Physical Exam Updated Vital Signs BP (!) 141/78 (BP Location: Right Arm)   Pulse 98   Temp 98.1 F (36.7 C) (Oral)   Resp 16   Ht 5\' 2"  (1.575 m)   Wt 50.3 kg   SpO2 98%   BMI 20.30 kg/m   Physical Exam  Constitutional: She appears well-developed and well-nourished. No distress.  HENT:  Head: Normocephalic and atraumatic.  Eyes: Conjunctivae are normal. Right eye exhibits no discharge. Left eye exhibits no discharge.  Neck: Neck supple.  Cardiovascular: Normal rate, regular rhythm and normal heart sounds. Exam reveals no gallop and no friction rub.  No murmur heard. Pulmonary/Chest: Effort normal and breath sounds normal. No respiratory distress.  Abdominal: Soft. She exhibits no distension. There is no tenderness.  Musculoskeletal: She exhibits no edema or tenderness.  Nickel sized superficial hematoma to medial R shin.   Neurological: She is alert.  Skin: Skin is warm and dry.    Psychiatric: She has a normal mood and affect. Her behavior is normal. Thought content normal.  Nursing note and vitals reviewed.    ED Treatments / Results  Labs (all labs ordered are listed, but only abnormal results are displayed) Labs Reviewed  URINALYSIS, ROUTINE W REFLEX MICROSCOPIC    EKG None  Radiology No results found.  Procedures Procedures (including critical care time)  Medications Ordered in ED Medications - No data to display   Initial Impression / Assessment and Plan / ED Course  I have reviewed the triage vital signs and the nursing notes.  Pertinent labs & imaging results that were available during my care of the patient were reviewed by me and considered in my medical decision making (see chart for details).     89yF with small hematoma to RLE. Should be fine with symptomatic tx. Abx for UTI. I feel appropriate for outp ttx.   Final Clinical Impressions(s) / ED Diagnoses   Final diagnoses:  Urinary tract infection with hematuria, site unspecified    ED Discharge Orders    None       Virgel Manifold, MD 11/21/18 [id number]

## 2018-11-17 LAB — URINE CULTURE

## 2018-11-19 ENCOUNTER — Other Ambulatory Visit: Payer: Self-pay

## 2018-12-02 ENCOUNTER — Inpatient Hospital Stay (HOSPITAL_COMMUNITY): Payer: Medicare Other | Attending: Hematology

## 2018-12-02 ENCOUNTER — Ambulatory Visit (HOSPITAL_COMMUNITY)
Admission: RE | Admit: 2018-12-02 | Discharge: 2018-12-02 | Disposition: A | Discharge status: Home / Self Care | Payer: Medicare Other | Source: Ambulatory Visit | Attending: Internal Medicine | Admitting: Internal Medicine

## 2018-12-02 DIAGNOSIS — I2510 Atherosclerotic heart disease of native coronary artery without angina pectoris: Secondary | ICD-10-CM | POA: Insufficient documentation

## 2018-12-02 DIAGNOSIS — R1319 Other dysphagia: Secondary | ICD-10-CM | POA: Diagnosis not present

## 2018-12-02 DIAGNOSIS — D472 Monoclonal gammopathy: Secondary | ICD-10-CM | POA: Diagnosis not present

## 2018-12-02 DIAGNOSIS — R1310 Dysphagia, unspecified: Secondary | ICD-10-CM | POA: Insufficient documentation

## 2018-12-02 DIAGNOSIS — R0602 Shortness of breath: Secondary | ICD-10-CM

## 2018-12-02 DIAGNOSIS — R1084 Generalized abdominal pain: Secondary | ICD-10-CM

## 2018-12-02 DIAGNOSIS — D649 Anemia, unspecified: Secondary | ICD-10-CM | POA: Diagnosis not present

## 2018-12-02 DIAGNOSIS — N390 Urinary tract infection, site not specified: Secondary | ICD-10-CM | POA: Diagnosis not present

## 2018-12-02 DIAGNOSIS — I10 Essential (primary) hypertension: Secondary | ICD-10-CM | POA: Insufficient documentation

## 2018-12-02 LAB — COMPREHENSIVE METABOLIC PANEL
ALT: 15 U/L (ref 0–44)
AST: 25 U/L (ref 15–41)
Albumin: 3.7 g/dL (ref 3.5–5.0)
Alkaline Phosphatase: 62 U/L (ref 38–126)
Anion gap: 9 (ref 5–15)
BUN: 24 mg/dL — ABNORMAL HIGH (ref 8–23)
CALCIUM: 9.4 mg/dL (ref 8.9–10.3)
CO2: 25 mmol/L (ref 22–32)
CREATININE: 1.14 mg/dL — AB (ref 0.44–1.00)
Chloride: 108 mmol/L (ref 98–111)
GFR calc Af Amer: 50 mL/min — ABNORMAL LOW (ref >60)
GFR calc non Af Amer: 43 mL/min — ABNORMAL LOW (ref >60)
GLUCOSE: 110 mg/dL — AB (ref 70–99)
Potassium: 3.4 mmol/L — ABNORMAL LOW (ref 3.5–5.1)
Sodium: 142 mmol/L (ref 135–145)
Total Bilirubin: 0.3 mg/dL (ref 0.3–1.2)
Total Protein: 7.4 g/dL (ref 6.5–8.1)

## 2018-12-02 LAB — CBC WITH DIFFERENTIAL/PLATELET
Abs Immature Granulocytes: 0.01 10*3/uL (ref 0.00–0.07)
Basophils Absolute: 0.1 10*3/uL (ref 0.0–0.1)
Basophils Relative: 1 %
EOS ABS: 0.6 10*3/uL — AB (ref 0.0–0.5)
EOS PCT: 8 %
HCT: 32 % — ABNORMAL LOW (ref 36.0–46.0)
Hemoglobin: 9.6 g/dL — ABNORMAL LOW (ref 12.0–15.0)
IMMATURE GRANULOCYTES: 0 %
LYMPHS ABS: 1.5 10*3/uL (ref 0.7–4.0)
Lymphocytes Relative: 18 %
MCH: 27.3 pg (ref 26.0–34.0)
MCHC: 30 g/dL (ref 30.0–36.0)
MCV: 90.9 fL (ref 80.0–100.0)
MONOS PCT: 8 %
Monocytes Absolute: 0.6 10*3/uL (ref 0.1–1.0)
Neutro Abs: 5.1 10*3/uL (ref 1.7–7.7)
Neutrophils Relative %: 65 %
Platelets: 227 10*3/uL (ref 150–400)
RBC: 3.52 MIL/uL — ABNORMAL LOW (ref 3.87–5.11)
RDW: 17 % — AB (ref 11.5–15.5)
WBC: 7.9 10*3/uL (ref 4.0–10.5)
nRBC: 0 % (ref 0.0–0.2)

## 2018-12-02 LAB — FOLATE: Folate: 12.2 ng/mL (ref >5.9)

## 2018-12-02 LAB — FERRITIN: Ferritin: 143 ng/mL (ref 11–307)

## 2018-12-02 LAB — LACTATE DEHYDROGENASE: LDH: 134 U/L (ref 98–192)

## 2018-12-02 LAB — VITAMIN B12: Vitamin B-12: 1470 pg/mL — ABNORMAL HIGH (ref 180–914)

## 2018-12-02 MED ORDER — IOHEXOL 300 MG/ML  SOLN
75.0000 mL | Freq: Once | INTRAMUSCULAR | Status: AC | PRN
  Administered 2018-12-02: 75 mL via INTRAVENOUS

## 2018-12-03 DIAGNOSIS — J302 Other seasonal allergic rhinitis: Secondary | ICD-10-CM | POA: Diagnosis not present

## 2018-12-03 LAB — HAPTOGLOBIN: HAPTOGLOBIN: 182 mg/dL (ref 34–200)

## 2018-12-04 ENCOUNTER — Inpatient Hospital Stay (HOSPITAL_BASED_OUTPATIENT_CLINIC_OR_DEPARTMENT_OTHER): Payer: Medicare Other | Admitting: Internal Medicine

## 2018-12-04 ENCOUNTER — Other Ambulatory Visit: Payer: Self-pay

## 2018-12-04 ENCOUNTER — Encounter (HOSPITAL_COMMUNITY): Payer: Self-pay | Admitting: Internal Medicine

## 2018-12-04 ENCOUNTER — Encounter: Payer: Self-pay | Admitting: Gastroenterology

## 2018-12-04 VITALS — BP 149/70 | HR 95 | Temp 98.1°F | Resp 18 | Wt 110.0 lb

## 2018-12-04 DIAGNOSIS — I10 Essential (primary) hypertension: Secondary | ICD-10-CM | POA: Diagnosis not present

## 2018-12-04 DIAGNOSIS — D472 Monoclonal gammopathy: Secondary | ICD-10-CM | POA: Diagnosis not present

## 2018-12-04 DIAGNOSIS — I2510 Atherosclerotic heart disease of native coronary artery without angina pectoris: Secondary | ICD-10-CM

## 2018-12-04 DIAGNOSIS — D649 Anemia, unspecified: Secondary | ICD-10-CM | POA: Diagnosis not present

## 2018-12-04 DIAGNOSIS — N390 Urinary tract infection, site not specified: Secondary | ICD-10-CM | POA: Diagnosis not present

## 2018-12-04 DIAGNOSIS — R1310 Dysphagia, unspecified: Secondary | ICD-10-CM | POA: Diagnosis not present

## 2018-12-04 LAB — METHYLMALONIC ACID, SERUM: Methylmalonic Acid, Quantitative: 184 nmol/L (ref 0–378)

## 2018-12-04 NOTE — Progress Notes (Signed)
Diagnosis MGUS (monoclonal gammopathy of unknown significance) - Plan: CBC with Differential/Platelet, Comprehensive metabolic panel, Lactate dehydrogenase, Ferritin, Protein electrophoresis, serum, IgG, IgA, IgM, Kappa/lambda light chains  Staging Cancer Staging No matching staging information was found for the patient.  Assessment and Plan:  1. Monoclonal gammopathy of undetermined significance: IgG monoclonal protein with kappa light chain specificity on immunofixation. SPEP demonstrated 0.2 g/dL spike on labs done June 2018.Marland Kitchen SPEP done October 2018 showed no M spike.    Labs done 11/07/2018 reviewed and showed WBC 12.5 HB 9.3 plts 281,000.  Chemistries WNL with K+ 3.6 Cr 0.90 and normal LFTS.  Calcium WNL at 9.2.  SPEP negative.  Quantitative IG WNL.  K/L light chain ratio WNL at 1.63.    Labs done 12/02/2018 reviewed and showed WBC 7.9 HB 9.6 plts 227,000.  Chemistries WNL with K+ 3.4 Cr 1.14 and normal LFTs.  Ferritin WNL at 143.  Folate normal at 12.  Adequate B12 levels.    I have again discussed with her than SPEP in October and November were negative.  She had the 1 isolated episode of SPEP with minimal m-spike of 0.2 g/dl on labs done in June of 2018. Pt continues to show no evidence of monoclonal protein, RI, elevated Calcium.  Pt will RTC in 1 year for follow-up with labs.    2.  Trouble swallowing.  Pt was reporting various complaints and was seen in ER for blackout spells.  She is complaining of trouble swallowing, night sweats, abdominal pain, and SOB.  Pt had CT angio of chest and CT abdomen and pelvis done 09/30/2018 that was reviewed and showed IMPRESSION: CHEST CTA  1. No evidence of a pulmonary embolism. 2. Right greater than left pleuroparenchymal opacity that is most consistent with scarring. 3. Subtle peribronchovascular hazy opacities in the lower lobes likely scarring or atelectasis. Infection is possible and should be considered in the proper clinical setting. No  other evidence of acute abnormality in the chest. 4. Aortic atherosclerosis.  ABDOMEN AND PELVIS CT  1. No acute findings within the abdomen or pelvis. No findings to account for abdominal pain. 2. Aortic atherosclerosis.  CT neck was done 12/02/2018 was reviewed and showed  IMPRESSION: 1. No CT evidence for acute abnormality within the neck. No findings to explain patient's symptoms identified. 2. Sequelae of prior total thyroidectomy. No residual thyroid tissue or mass identified within the thyroid bed. 3. Mild allergic/inflammatory paranasal sinus disease as above. No air-fluid levels to suggest acute sinusitis.  I discussed with her CT neck shows no abnormalities to explain her symptoms.  She is referred to GI for evaluation.    3.  Blackout spells.  Pt is referred to neurology for evaluation. She was seen in ER and had CT brain done 11/04/2018 that showed ischemic changes and atrophy.    4.  Anemia.  HB decreased at 9.6 compared to labs done 03/2018 when HB was 10.9.  She has normal LDH, ferritin,folate, B12.  Pt was recently diagnosed with UTI and treated with abx.  I discussed with her mild decrease in HB compared to labs done 03/2018 may be related to infection.  Pt will have repeat labs in 1 year.  Follow-up with PCP for monitoring due to UTI.    5.  UTI.  Pt was recently treated with Keflex.  I discussed with her to follow-up with PCP for monitoring.      6.  Hypertension.  Blood pressure is 149/70.  Follow with PCP.  7.  Hypothyroidism.  Patient is on Synthroid.  CT neck shows evidence of thyroidectomy.  Follow-up with PCP or endocrinology for monitoring.  8.  Joint pain.  She has a diagnosis of osteoarthritis.  She has undergone knee replacements in the past.  9.  Night sweats.  She reports symptoms have improved.  Scans done 09/30/2018 showed no adenopathy.  Pt showed lung scarring.  She has a normal LDH.  Follow-up with PCP if ongoing symptoms.  Pt was recently diagnosed  with UTI and has completed abx.    25 minutes spent with more than 50% spent in counseling and coordination of care.    Interval History:  84 y.o. female previously followed by Dr. Talbert Cage for monoclonal gammopathy. Patient was seen her neurologist who checked a myeloma panel which demonstrated that patient had IgG monoclonal protein with kappa light chain specificity on immunofixation. SPEP demonstrated 0.2 g/dL spike. CMP from 05/21/17 demonstrated creatinine 0.76, calcium 9.2. Pt has remained on observation.    Current Status: Patient is seen today for follow-up to go over labs and scans.  She was recently diagnosed with UTI and has completed abx.    Problem List Patient Active Problem List   Diagnosis Date Noted  . Neck pain [M54.2]   . Chest pain [R07.9] 09/30/2018  . Generalized weakness [R53.1] 09/30/2018  . Hypokalemia [E87.6]   . MGUS (monoclonal gammopathy of unknown significance) [D47.2] 10/21/2017  . Primary localized osteoarthrosis of right shoulder [M19.011] 06/21/2015  . S/P shoulder replacement [Z96.619] 06/21/2015  . Osteoarthritis of right knee [M17.11] 07/27/2014  . 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 Syncope and collapse [R55]   . Anemia, normocytic normochromic [D64.9] 04/26/2012  . Gastroesophageal reflux disease [K21.9]   . Hypertension [I10] 12/07/2011  . History of TIAs [Z86.73] 12/07/2011  . Leg pain, bilateral [H06.237, M79.605] 04/27/2011  . Hyperlipidemia [E78.5] 04/27/2011  . Other dysphagia [R13.19] 06/07/2010    Past Medical History Past Medical History:  Diagnosis Date  . Anemia   . Asthma   . Cancer ([place])    skin Ca- ? basal cell   . Chronic back pain   . Chronic neck pain   . Degenerative joint disease    Left shoulder; cervical spine, knees & hands   . Gastroesophageal reflux disease    Hiatal hernia; distal esophageal web requiring dilatation; gastric polyps; gastritis; refuses colonoscopy  . GERD (gastroesophageal reflux  disease)   . History of stress test 1990's   stress test done under the care of Dr. Lattie Haw & Dr. Gwenlyn Found, now being followed by Dr. Harl Bowie- in [place] , recently seen & told to f/U in one yr.   . Hyperlipidemia 04/27/2011  . Hypertension   . Hypothyroidism   . Lymphocytic thyroiditis   . Multiple thyroid nodules 2010   Adenomatous; thyroidectomy in 2010  . Osteoarthritis of right knee 07/27/2014  . Pneumonia    hosp. for pneumonia- long time ago   . Primary localized osteoarthrosis of right shoulder 06/21/2015  . Seizures ([place])    yes- as a child- & into adult years, states she took med. for them at one time, stopped at 32 yrs. of age   . Syncope and collapse 2007   Possible CVA in 2012 with left lower extremity weakness; refused hospitalization; CT-Atrophy and chronic microvascular ischemic change.     Past Surgical History Past Surgical History:  Procedure Laterality Date  . ABDOMINAL HYSTERECTOMY     fibroids  . CATARACT EXTRACTION     Bilateral;  redo surgery on the right for incomplete primary procedure  . COLONOSCOPY  Remote  . ESOPHAGOGASTRODUODENOSCOPY  12/10   Dr. 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 ESOPHAGOGASTRODUODENOSCOPY N/A 03/21/2015   Procedure: ESOPHAGOGASTRODUODENOSCOPY (EGD);  Surgeon: Danie Binder, MD;  [facility];  Service: Endoscopy;  Laterality: N/A;  1130   . EYE SURGERY    . PROLAPSED UTERINE FIBROID LIGATION     outcomed with rectocele & cystocele  . SAVORY DILATION N/A 03/21/2015   Procedure: SAVORY DILATION;  Surgeon: Danie Binder, MD;  [facility];  Service: Endoscopy;  Laterality: N/A;  . SHOULDER SURGERY     left  . TOTAL KNEE ARTHROPLASTY Right 07/27/2014   Procedure: RIGHT TOTAL KNEE ARTHROPLASTY;  Surgeon: Johnny Bridge, MD;  Location: [facility];  Service: Orthopedics;  Laterality: Right;  . TOTAL SHOULDER ARTHROPLASTY Right 06/21/2015   Procedure: RIGHT TOTAL  SHOULDER ARTHROPLASTY;  Surgeon: Marchia Bond, MD;  Location: [facility];  Service: Orthopedics;  Laterality: Right;  . TOTAL THYROIDECTOMY  2010    Family History Family History  Problem Relation Age of Onset  . Heart disease Mother   . Gallbladder disease Mother   . Heart failure Mother   . Breast cancer Sister        30 years ago  . Heart attack Father   . Colon cancer Neg Hx   . Stroke Neg Hx      Social History  reports that she quit smoking about 39 years ago. Her smoking use included cigarettes. She has a 16.00 pack-year smoking history. She has never used smokeless tobacco. She reports that she does not drink alcohol or use drugs.  Medications  Current Outpatient Medications:  .  albuterol (PROVENTIL HFA) 108 (90 BASE) MCG/ACT inhaler, Inhale 1-2 puffs into the lungs every 6 (six) hours as needed for wheezing or shortness of breath. Wheezing, Asthma Symptoms, Disp: , Rfl:  .  aspirin 81 MG tablet, Take 81 mg by mouth daily., Disp: , Rfl:  .  benzonatate (TESSALON) 200 MG capsule, Take 200 mg by mouth 2 (two) times daily as needed. , Disp: , Rfl:  .  calcium-vitamin D (OSCAL WITH D) 500-200 MG-UNIT per tablet, Take 1 tablet by mouth daily. , Disp: , Rfl:  .  diazepam (VALIUM) 5 MG tablet, Take one tablet by mouth at bedtime as needed for sleep (Patient taking differently: Take 2.5 mg by mouth at bedtime as needed (sleep). ), Disp: 30 tablet, Rfl: 5 .  EPINEPHrine 0.3 mg/0.3 mL IJ SOAJ injection, Inject into the muscle as needed for anaphylaxis., Disp: , Rfl:  .  ferrous sulfate 325 (65 FE) MG tablet, Take 325 mg by mouth daily with breakfast., Disp: , Rfl:  .  ipratropium (ATROVENT) 0.03 % nasal spray, Place 2 sprays into both nostrils daily as needed (allergies stuffy nose). , Disp: , Rfl:  .  levocetirizine (XYZAL) 5 MG tablet, Take 5 mg by mouth every evening.  , Disp: , Rfl:  .  levothyroxine (SYNTHROID, LEVOTHROID) 75 MCG tablet, Take 1 tablet (75 mcg total) by mouth daily.,  Disp: 90 tablet, Rfl: 0 .  montelukast (SINGULAIR) 10 MG tablet, Take 10 mg by mouth at bedtime., Disp: , Rfl:  .  omeprazole (PRILOSEC) 20 MG capsule, 1 PO 30 mins prior to breakfast and supper (Patient taking differently: Take 20 mg by mouth daily. 1 PO 30 mins prior to breakfast and supper), Disp:  60 capsule, Rfl: 5 .  pravastatin (PRAVACHOL) 80 MG tablet, Take 1 tablet (80 mg total) by mouth every evening., Disp: 90 tablet, Rfl: 3 .  senna (SENOKOT) 8.6 MG tablet, Take 1 tablet by mouth daily., Disp: , Rfl:  .  vitamin B-12 (CYANOCOBALAMIN) 1000 MCG tablet, Take 1,000 mcg by mouth every morning., Disp: , Rfl:  .  vitamin C (ASCORBIC ACID) 500 MG tablet, Take 500 mg by mouth daily., Disp: , Rfl:  .  vitamin E 400 UNIT capsule, Take 400 Units by mouth at bedtime. , Disp: , Rfl:   Allergies Celecoxib; Dexilant [dexlansoprazole]; and Famotidine  Review of Systems Review of Systems - Oncology ROS negative other than trouble swallowing   Physical Exam  Vitals Wt Readings from Last 3 Encounters:  12/04/18 110 lb (49.9 kg)  11/16/18 111 lb (50.3 kg)  11/13/18 110 lb 8 oz (50.1 kg)   Temp Readings from Last 3 Encounters:  12/04/18 98.1 F (36.7 C) (Oral)  11/16/18 98.1 F (36.7 C) (Oral)  11/13/18 98.3 F (36.8 C) (Oral)   BP Readings from Last 3 Encounters:  12/04/18 (!) 149/70  11/16/18 (!) 144/76  11/13/18 (!) 108/56   Pulse Readings from Last 3 Encounters:  12/04/18 95  11/16/18 93  11/13/18 99   Constitutional: Well-developed, well-nourished, and in no distress.   HENT: Head: Normocephalic and atraumatic.  Mouth/Throat: No oropharyngeal exudate. Mucosa moist. Eyes: Pupils are equal, round, and reactive to light. Conjunctivae are normal. No scleral icterus.  Neck: Normal range of motion. Neck supple. No JVD present.  Cardiovascular: Normal rate, regular rhythm and normal heart sounds.  Exam reveals no gallop and no friction rub.   No murmur heard. Pulmonary/Chest:  Effort normal and breath sounds normal. No respiratory distress. No wheezes.No rales.  Abdominal: Soft. Bowel sounds are normal. No distension. There is no tenderness. There is no guarding.  Musculoskeletal: No edema or tenderness.  Lymphadenopathy: No cervical, axillary or supraclavicular adenopathy.  Neurological: Alert and oriented to person, place, and time. No cranial nerve deficit.  Skin: Skin is warm and dry. No rash noted. No erythema. No pallor.  Psychiatric: Affect and judgment normal.   Labs Appointment on 12/02/2018  Component Date Value Ref Range Status  . WBC 12/02/2018 7.9  4.0 - 10.5 K/uL Final  . RBC 12/02/2018 3.52* 3.87 - 5.11 MIL/uL Final  . Hemoglobin 12/02/2018 9.6* 12.0 - 15.0 g/dL Final  . HCT 12/02/2018 32.0* 36.0 - 46.0 % Final  . MCV 12/02/2018 90.9  80.0 - 100.0 fL Final  . MCH 12/02/2018 27.3  26.0 - 34.0 pg Final  . MCHC 12/02/2018 30.0  30.0 - 36.0 g/dL Final  . RDW 12/02/2018 17.0* 11.5 - 15.5 % Final  . Platelets 12/02/2018 227  150 - 400 K/uL Final  . nRBC 12/02/2018 0.0  0.0 - 0.2 % Final  . Neutrophils Relative % 12/02/2018 65  % Final  . Neutro Abs 12/02/2018 5.1  1.7 - 7.7 K/uL Final  . Lymphocytes Relative 12/02/2018 18  % Final  . Lymphs Abs 12/02/2018 1.5  0.7 - 4.0 K/uL Final  . Monocytes Relative 12/02/2018 8  % Final  . Monocytes Absolute 12/02/2018 0.6  0.1 - 1.0 K/uL Final  . Eosinophils Relative 12/02/2018 8  % Final  . Eosinophils Absolute 12/02/2018 0.6* 0.0 - 0.5 K/uL Final  . Basophils Relative 12/02/2018 1  % Final  . Basophils Absolute 12/02/2018 0.1  0.0 - 0.1 K/uL Final  . Immature Granulocytes  12/02/2018 0  % Final  . Abs Immature Granulocytes 12/02/2018 0.01  0.00 - 0.07 K/uL Final   Performed at [facility], [address] [place], [place] [address]  . Sodium 12/02/2018 142  135 - 145 mmol/L Final  . Potassium 12/02/2018 3.4* 3.5 - 5.1 mmol/L Final  . Chloride 12/02/2018 108  98 - 111 mmol/L Final  . CO2 12/02/2018 25  22  - 32 mmol/L Final  . Glucose, Bld 12/02/2018 110* 70 - 99 mg/dL Final  . BUN 12/02/2018 24* 8 - 23 mg/dL Final  . Creatinine, Ser 12/02/2018 1.14* 0.44 - 1.00 mg/dL Final  . Calcium 12/02/2018 9.4  8.9 - 10.3 mg/dL Final  . Total Protein 12/02/2018 7.4  6.5 - 8.1 g/dL Final  . Albumin 12/02/2018 3.7  3.5 - 5.0 g/dL Final  . AST 12/02/2018 25  15 - 41 U/L Final  . ALT 12/02/2018 15  0 - 44 U/L Final  . Alkaline Phosphatase 12/02/2018 62  38 - 126 U/L Final  . Total Bilirubin 12/02/2018 0.3  0.3 - 1.2 mg/dL Final  . GFR calc non Af Amer 12/02/2018 43* >60 mL/min Final  . GFR calc Af Amer 12/02/2018 50* >60 mL/min Final  . Anion gap 12/02/2018 9  5 - 15 Final   Performed at [facility], [address] [place], [place] [address]  . LDH 12/02/2018 134  98 - 192 U/L Final   Performed at [facility], [address], [place], [place] [address]  . Ferritin 12/02/2018 143  11 - 307 ng/mL Final   Performed at [facility], [address] [place], [place] [address]  . Vitamin B-12 12/02/2018 1,470* 180 - 914 pg/mL Final   Comment: (NOTE) This assay is not validated for testing neonatal or myeloproliferative syndrome specimens for Vitamin B12 levels. Performed at [facility], [address], [place], [place] [address]   . Folate 12/02/2018 12.2  >5.9 ng/mL Final   Performed at [facility], [address], [place],  [address]  . Haptoglobin 12/02/2018 182  34 - 200 mg/dL Final   Comment: (NOTE)   **Effective December 15, 2018 Haptoglobin     reference interval will be changing to:            Age              Female             Female          0 -  6 months     Not Estab.       Not Estab.   7 months -  1 year       23 - 218         23 - 218          2 -  5 years      10 - 212         10 - 212          6 - 12 years      10 - 182         10 - 182         13 - 17 years      69 - 191         22 - 208         18 - 40 years      95 - 317         17 -  278         41 - 50 years      23 - 355          42 - 296         51 - 60 years      59 - 370         39 - 346         61 - 70 years      34 - 932         35 - 573         22 - 80 years      60 - 025         42 - 706             >80 years      30 - [place] Performed At: [facility] [address] [place], [place] [id number] Rush Farmer MD [id number]      Pathology Orders Placed This Encounter  Procedures  . CBC with Differential/Platelet    Standing Status:   Future    Standing Expiration Date:   12/04/2020  . Comprehensive metabolic panel    Standing Status:   Future    Standing Expiration Date:   12/04/2020  . Lactate dehydrogenase    Standing Status:   Future    Standing Expiration Date:   12/04/2020  . Ferritin    Standing Status:   Future    Standing Expiration Date:   12/04/2020  . Protein electrophoresis, serum    Standing Status:   Future    Standing Expiration Date:   12/04/2020  . IgG, IgA, IgM    Standing Status:   Future    Standing Expiration Date:   12/04/2020  . Kappa/lambda light chains    Standing Status:   Future    Standing Expiration Date:   12/04/2020       Zoila Shutter MD

## 2018-12-23 ENCOUNTER — Telehealth: Payer: Self-pay | Admitting: Endocrinology

## 2018-12-23 ENCOUNTER — Other Ambulatory Visit: Payer: Self-pay

## 2018-12-23 MED ORDER — LEVOTHYROXINE SODIUM 75 MCG PO TABS: 75.0000 ug | ORAL_TABLET | Freq: Every day | ORAL | 2 refills | Status: DC

## 2018-12-23 NOTE — Telephone Encounter (Signed)
rx sent

## 2018-12-23 NOTE — Telephone Encounter (Signed)
MEDICATION: levothyroxine (SYNTHROID, LEVOTHROID) 75 MCG tablet   PHARMACY:  [facility] - [place], [place]  IS THIS A 90 DAY SUPPLY : Yes, with 3 refills  IS PATIENT OUT OF MEDICATION: No  IF NOT; HOW MUCH IS LEFT: a little over 2 weeks  LAST APPOINTMENT DATE: @10 /28/2019  NEXT APPOINTMENT DATE:@3 /16/2020  DO WE HAVE YOUR PERMISSION TO LEAVE A DETAILED MESSAGE:  OTHER COMMENTS:    **Let patient know to contact pharmacy at the end of the day to make sure medication is ready. **  ** Please notify patient to allow 48-72 hours to process**  **Encourage patient to contact the pharmacy for refills or they can request refills through [facility]**

## 2018-12-25 ENCOUNTER — Encounter: Payer: Self-pay | Admitting: Cardiology

## 2018-12-25 DIAGNOSIS — Z8673 Personal history of transient ischemic attack (TIA), and cerebral infarction without residual deficits: Secondary | ICD-10-CM | POA: Insufficient documentation

## 2018-12-26 ENCOUNTER — Encounter: Payer: Self-pay | Admitting: Cardiology

## 2018-12-26 ENCOUNTER — Ambulatory Visit: Payer: Medicare Other | Admitting: Cardiology

## 2018-12-26 ENCOUNTER — Ambulatory Visit (INDEPENDENT_AMBULATORY_CARE_PROVIDER_SITE_OTHER): Payer: Medicare Other

## 2018-12-26 ENCOUNTER — Ambulatory Visit (INDEPENDENT_AMBULATORY_CARE_PROVIDER_SITE_OTHER): Payer: Medicare Other | Admitting: Cardiology

## 2018-12-26 VITALS — BP 132/58 | HR 87 | Ht 62.0 in | Wt 114.6 lb

## 2018-12-26 DIAGNOSIS — R55 Syncope and collapse: Secondary | ICD-10-CM | POA: Insufficient documentation

## 2018-12-26 DIAGNOSIS — Z8673 Personal history of transient ischemic attack (TIA), and cerebral infarction without residual deficits: Secondary | ICD-10-CM

## 2018-12-26 DIAGNOSIS — K219 Gastro-esophageal reflux disease without esophagitis: Secondary | ICD-10-CM | POA: Diagnosis not present

## 2018-12-26 NOTE — Assessment & Plan Note (Addendum)
Possibly orthostatic from poor PO intake and dehydration

## 2018-12-26 NOTE — Assessment & Plan Note (Signed)
Issues swallowing

## 2018-12-26 NOTE — Assessment & Plan Note (Signed)
On ASA 81 mg

## 2018-12-26 NOTE — Progress Notes (Signed)
12/26/2018 Christine Leblanc   03-11-1929  [id number]  Primary Physician Celene Squibb, MD Primary Cardiologist: Dr Harl Bowie  HPI: Patient is a pleasant 84 year old female who lives in her own home, she still drives.  Her daughter is disabled from a history of seizures and lives with her.  She says they pretty much take care of each other.  The patient has been followed by cardiology for some years though she is never had documented coronary disease or an MI.  She is in the office today after a near syncopal event.  The patient gives me a history of a near syncopal spell that sounds orthostatic.  This was then October 2019.  The patient has trouble staying hydrated and has reflux.  She thinks she may have been dehydrated at the time. The patient had been on the commode when she felt "funny". She went to lay down and then got up to go speak to her son when she could tell she was going down. She did not suffer any injury. She denies palpitations or tachycardia. She has had subsequent episodes that sound like orthostatic dizziness since but no syncope.    Current Outpatient Medications  Medication Sig Dispense Refill  . albuterol (PROVENTIL HFA) 108 (90 BASE) MCG/ACT inhaler Inhale 1-2 puffs into the lungs every 6 (six) hours as needed for wheezing or shortness of breath. Wheezing, Asthma Symptoms    . aspirin 81 MG tablet Take 81 mg by mouth daily.    . benzonatate (TESSALON) 200 MG capsule Take 200 mg by mouth 2 (two) times daily as needed.     . calcium-vitamin D (OSCAL WITH D) 500-200 MG-UNIT per tablet Take 1 tablet by mouth daily.     . diazepam (VALIUM) 5 MG tablet Take one tablet by mouth at bedtime as needed for sleep (Patient taking differently: Take 2.5 mg by mouth at bedtime as needed (sleep). ) 30 tablet 5  . EPINEPHrine 0.3 mg/0.3 mL IJ SOAJ injection Inject into the muscle as needed for anaphylaxis.    . ferrous sulfate 325 (65 FE) MG tablet Take 325 mg by mouth daily with breakfast.      . ipratropium (ATROVENT) 0.03 % nasal spray Place 2 sprays into both nostrils daily as needed (allergies stuffy nose).     Marland Kitchen levocetirizine (XYZAL) 5 MG tablet Take 5 mg by mouth every evening.      Marland Kitchen levothyroxine (SYNTHROID, LEVOTHROID) 75 MCG tablet Take 1 tablet (75 mcg total) by mouth daily. 90 tablet 2  . montelukast (SINGULAIR) 10 MG tablet Take 10 mg by mouth at bedtime.    Marland Kitchen omeprazole (PRILOSEC) 20 MG capsule 1 PO 30 mins prior to breakfast and supper (Patient taking differently: Take 20 mg by mouth daily. 1 PO 30 mins prior to breakfast and supper) 60 capsule 5  . pravastatin (PRAVACHOL) 80 MG tablet Take 1 tablet (80 mg total) by mouth every evening. 90 tablet 3  . senna (SENOKOT) 8.6 MG tablet Take 1 tablet by mouth daily.    . vitamin B-12 (CYANOCOBALAMIN) 1000 MCG tablet Take 1,000 mcg by mouth every morning.    . vitamin C (ASCORBIC ACID) 500 MG tablet Take 500 mg by mouth daily.    . vitamin E 400 UNIT capsule Take 400 Units by mouth at bedtime.      No current facility-administered medications for this visit.     Allergies  Allergen Reactions  . Celecoxib Shortness Of Breath  . Dexilant [Dexlansoprazole]  Anaphylaxis    abd pain  . Famotidine     Makes her feel "very bad"    Past Medical History:  Diagnosis Date  . Anemia   . Asthma   . Cancer ([place])    skin Ca- ? basal cell   . Chronic back pain   . Chronic neck pain   . Degenerative joint disease    Left shoulder; cervical spine, knees & hands   . Gastroesophageal reflux disease    Hiatal hernia; distal esophageal web requiring dilatation; gastric polyps; gastritis; refuses colonoscopy  . GERD (gastroesophageal reflux disease)   . History of stress test 1990's   stress test done under the care of Dr. Lattie Haw & Dr. Gwenlyn Found, now being followed by Dr. Harl Bowie- in [place] , recently seen & told to f/U in one yr.   . Hyperlipidemia 04/27/2011  . Hypertension   . Hypothyroidism   . Lymphocytic thyroiditis   .  Multiple thyroid nodules 2010   Adenomatous; thyroidectomy in 2010  . Osteoarthritis of right knee 07/27/2014  . Pneumonia    hosp. for pneumonia- long time ago   . Primary localized osteoarthrosis of right shoulder 06/21/2015  . Seizures ([place])    yes- as a child- & into adult years, states she took med. for them at one time, stopped at 32 yrs. of age   . Syncope and collapse 2007   Possible CVA in 2012 with left lower extremity weakness; refused hospitalization; CT-Atrophy and chronic microvascular ischemic change.     Social History   Socioeconomic History  . Marital status: Widowed    Spouse name: Not on file  . Number of children: 4  . Years of education: 1  . Highest education level: Not on file  Occupational History  . 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 Occupation: Retired    Comment: Marine scientist  Social Needs  . Financial resource strain: Not on file  . Food insecurity:    Worry: Not on file    Inability: Not on file  . Transportation needs:    Medical: Not on file    Non-medical: Not on file  Tobacco Use  . Smoking status: Former Smoker    Packs/day: 0.80    Years: 20.00    Pack years: 16.00    Types: Cigarettes    Last attempt to quit: 12/31/1978    Years since quitting: 40.0  . Smokeless tobacco: Never Used  Substance and Sexual Activity  . Alcohol use: No  . Drug use: No  . Sexual activity: Never    Comment: widowed since 2010  Lifestyle  . Physical activity:    Days per week: Not on file    Minutes per session: Not on file  . Stress: Not on file  Relationships  . Social connections:    Talks on phone: Not on file    Gets together: Not on file    Attends religious service: Not on file    Active member of club or organization: Not on file    Attends meetings of clubs or organizations: Not on file    Relationship status: Not on file  . Intimate partner violence:    Fear of current or ex partner: Not on file    Emotionally  abused: Not on file    Physically abused: Not on file    Forced sexual activity: Not on file  Other Topics Concern  . Not on file  Social History  Narrative   Lives with daughter   Caffeine use: 1-2 cups coffee per day     Family History  Problem Relation Age of Onset  . Heart disease Mother   . Gallbladder disease Mother   . Heart failure Mother   . Breast cancer Sister        30 years ago  . Heart attack Father   . Colon cancer Neg Hx   . Stroke Neg Hx      Review of Systems: General: negative for chills, fever, night sweats or weight changes.  Cardiovascular: negative for chest pain, dyspnea on exertion, edema, orthopnea, palpitations, paroxysmal nocturnal dyspnea or shortness of breath Dermatological: negative for rash Respiratory: negative for cough or wheezing Urologic: negative for hematuria Abdominal: negative for nausea, vomiting, diarrhea, bright red blood per rectum, melena, or hematemesis Neurologic: negative for visual changes, syncope, or dizziness All other systems reviewed and are otherwise negative except as noted above.    Blood pressure (!) 132/58, pulse 87, height 5\' 2"  (1.575 m), weight 114 lb 9.6 oz (52 kg), SpO2 97 %.  General appearance: alert, cooperative, appears stated age and no distress Neck: no carotid bruit and no JVD Lungs: clear to auscultation bilaterally Heart: regular rate and rhythm Extremities: no edema Skin: Skin color, texture, turgor normal. No rashes or lesions Neurologic: Grossly normal  EKG 11/04/18- NSR-RBBB, LPFB  Orthostatic B/P- 162/62 laying down 142/78 sitting up 142/62 standing  ASSESSMENT AND PLAN:   Near syncope Possibly orthostatic from poor PO intake and dehydration  History of TIA (transient ischemic attack) On ASA 81 mg  Gastroesophageal reflux disease Issues swallowing   PLAN  Check 3 day ZIO- encouraged hydration- f/u with Dr Carmon Ginsberg [facility] PA-C 12/26/2018 11:33 AM

## 2018-12-26 NOTE — Patient Instructions (Signed)
Medication Instructions:  Your physician recommends that you continue on your current medications as directed. Please refer to the Current Medication list given to you today.  If you need a refill on your cardiac medications before your next appointment, please call your pharmacy.   Lab work: None If you have labs (blood work) drawn today and your tests are completely normal, you will receive your results only by: Marland Kitchen MyChart Message (if you have MyChart) OR . A paper copy in the mail If you have any lab test that is abnormal or we need to change your treatment, we will call you to review the results.  Testing/Procedures: Your physician has recommended that you wear an event monitor for 3 days . Event monitors are medical devices that record the heart's electrical activity. Doctors most often [place] these monitors to diagnose arrhythmias. Arrhythmias are problems with the speed or rhythm of the heartbeat. The monitor is a small, portable device. You can wear one while you do your normal daily activities. This is usually used to diagnose what is causing palpitations/syncope (passing out).    Follow-Up: Dr Harl Bowie at his next available  Any Other Special Instructions Will Be Listed Below (If Applicable). NONE

## 2018-12-29 DIAGNOSIS — N3001 Acute cystitis with hematuria: Secondary | ICD-10-CM | POA: Diagnosis not present

## 2019-01-07 ENCOUNTER — Ambulatory Visit: Payer: Medicare Other | Admitting: Gastroenterology

## 2019-01-07 DIAGNOSIS — J302 Other seasonal allergic rhinitis: Secondary | ICD-10-CM | POA: Diagnosis not present

## 2019-01-13 ENCOUNTER — Other Ambulatory Visit (HOSPITAL_COMMUNITY): Payer: Self-pay | Admitting: Internal Medicine

## 2019-01-13 DIAGNOSIS — Z1231 Encounter for screening mammogram for malignant neoplasm of breast: Secondary | ICD-10-CM

## 2019-01-16 ENCOUNTER — Other Ambulatory Visit: Payer: Self-pay

## 2019-01-16 ENCOUNTER — Other Ambulatory Visit: Payer: Self-pay | Admitting: *Deleted

## 2019-01-16 DIAGNOSIS — R55 Syncope and collapse: Secondary | ICD-10-CM

## 2019-01-16 NOTE — Progress Notes (Signed)
event  +  +  +  +  +  +  +  +  +  +  +  +  +  +  +  +  +  +  +  +  +  +  +  +  +  +  +  +  +  +  +  +  +  +  +  +  +  +  +  +  +  +  +  +  +  +  +  +  +  +  +  +  +  +  +  +  +  +  +  +  +  +  +  +  +  +  +  +  +  +  +  +  +  +  +  +  +  +  +  +  +  +  +  +  +  +  +  +  +  +  +  +  +  +  +  +  +  +  +  +  +  +  +  +  +  +  +  +  +  +  +  +  +  +  +  +  +  +  +  +  +  +  +  +  +  +  +  +  +  +  +  +  +  +  +  +  +  +  +  +  +  +  +  +  +  +  +  +  +  +  +  +  +  +  +  +  +  +  +  +  +  +  +  +  +  +  +  +  +  +  +  +  +  +  +  +  +  +  +  +  +  +  +  +  +  +  +  +  +  +  +  +  +  +  +  +  +  +  +  +  +  +  +  +  +  +  +  +  +  +  +  +  +  +  +  +  +  +  +  +  +  +  +  +  +  +  +  +  +  +  +  +  +  +  +  +  +  +  +  +  +  +  +  +  +  +  +  +  +  +  +  +  +  +  +  +  +  +  +  +  +  +  +  +  +  +  +  +  +  +  +  +  +  +  +  +  +  +  +  +  +  +  +  +  +  +  +  +  +  +  +  +  +  +  +  +  +  +  +  +  +  +  +  +  +  +  +  +  +  +  +  +  +  +  +  +  +  +  +  +  +  +  +  +  +  +  +  +  +  +  +  +  +  +  +  +  +  +  +  +  +  +  +  +  +  +  +  +  +  +  +  +  +  +  +  +  +  +  +  +  +  +  +  +  +  +  +  +  +  +  +  +  +  +  +  +  +  +  +  +  +  +  +  +  +  +  +  +  +  +  +  +  +  +  +  +  +  +  +  +  +  +  +  +  +  +  +  +  +  +  +  +  +  +  +  +  +  +  +  +  +  +  +  +  +  +  +  +  +  +  +  +  +  +  +  +  +  +  +  +  +  +  +  +  +  +  +  +  +  +  +  +  +  +  +  +  +  +  +  +  +  +  +  +  +  +  +  +  +  +  +  +  +  +  +  +  +  +  +  +  +  +  +  +  +  +  +  +  +  +  +  +  +  +  +  +  +  +  +  +  +  +  +  +  +  +  +  +  +  +  +  +  +  +  +  +  +  +  +  +  +  +  +  +  +  +  +  +  +  +  +  +  +  +  +  +  +  +  +  +  +  +  +  +  +  +  +  +  +  +  +  +  +  +  +  +  +  +  +  +  +  +  +  +  +  +  +  +  +  +  +  +  +  +  +  +  +  +  +  +  +  +  +  +  +  +  +  +  +  +  +  +  +  +  +  +  +  +  +  +  +  +  +  +  +  +  +  +  +  +  +  +  +  +  +  +  +  +  +  +  +  +  +  +  +  +  +  +  +  +  +  +  +  +  +  +  +  +  +  +  +  +  +  +  +  +  +   +  +  +  +  +  +  +  +  +  +  +  +  +  +  +  +  +  +  +  +  +  +  +  +  +  +  +  +  +  +  +  +  +

## 2019-01-20 ENCOUNTER — Ambulatory Visit (INDEPENDENT_AMBULATORY_CARE_PROVIDER_SITE_OTHER): Payer: Medicare Other | Admitting: Neurology

## 2019-01-20 ENCOUNTER — Encounter: Payer: Self-pay | Admitting: Neurology

## 2019-01-20 VITALS — BP 152/76 | HR 86 | Ht 62.0 in | Wt 111.0 lb

## 2019-01-20 DIAGNOSIS — N390 Urinary tract infection, site not specified: Secondary | ICD-10-CM | POA: Diagnosis not present

## 2019-01-20 DIAGNOSIS — I951 Orthostatic hypotension: Secondary | ICD-10-CM

## 2019-01-20 NOTE — Progress Notes (Signed)
[facility]    Provider:  Dr Jaynee Eagles Referring Provider: Veronda Prude Primary Care Physician:  Celene Squibb, MD  CC:  "Passing out"  Interval history 01/20/2019: This is a patient who is here for a new problem, she felt like she passed out and her son helped her to the ground.  She may have passed out 1 month prior.  She was evaluated here in the past for chronic dysphasia and work-up was largely unremarkable including lab testing, MRIs, electromyographic and nerve conduction study testing. She is back today for a different problem.  Patient has an extensive PMHx, monoclonal gammopathy of undetermined significance.  She was recently seen in the emergency room for "blackout spells".  Patient has chronic dysphagia also complains of night sweats, abdominal pain and shortness of breath.  Patient had CT Angie of the chest and CT abdomen and pelvis done September 30, 2018.   CT of the brain done November 04, 2018 showed nothing acute.  No CT evidence for acute abnormality of the neck after CT soft tissue of the neck with contrast.   Patient was seen in the emergency room on 11/04/2018 for a syncopal episode briefly no chest pain or shortness of breath.  She has longstanding weakness and dysphasia.  She was describing orthostatic symptoms.  Blood work revealed chronic anemia.  She was seen again on the 17th for similar symptoms and was diagnosed with a UTI.  She denies any alteration of awareness, seizure-like activity, focal neurologic deficits.  She says this happened in November and hasn't happened since then. The episodes are so long ago she can;t remember. She says she woke up and had to go to the bathroom. She made it to the bathroom and the next thing she knew she was on the floor, no one witnessed the event. No bruises, she remembers the episode and remembers hitting her elbow on the ground. She felt alright. She was sitting. She went to the ED and she was dehydrated and anemic and  the second ED visit she had a UTI. She also has a problem of constipation. She was feeling weak and dizzy when standing and would have to sit down. None since November.  She had a heart monitor on her. She has no history of seizures or seizure-like activy.     Interval history 05/21/2017. Patient was seen almost a year ago for dysphasia. Workup was largely unremarkable including lab testing and MRI of the brain. She was asked to follow up in 3 months but has not followed up for a year. Her dysphagia is stable, no worsening, in fact possibly improved. She is drooling form time to time. She denies fasciculations. She denies weakness. She denies weight loss. She went to speech therapy last in march this year. No neck pain.   MRi brain 06/2016:  This MRI of the brain without contrast shows the following: 1.    Chronic lacunar infarction involving the right putamen and adjacent posterior limb of the internal capsule. This was noted on CT scan from 08/02/2012 2.    Extensive T2/FLAIR hyperintense foci consistent with advanced chronic microvascular ischemic change.  Only mild changes are noted within the right pons.  A similar extent of hemispheric changes is seen on CT scan from 2013. 3.    Mild cortical atrophy most pronounced in the parietal lobes. 4.     There is asymmetry of signal within the basal ganglia on susceptibility weighted images.    The significance of  this finding is unclear and most likely represents asymmetry of iron deposition. It is unlikely to represent sequela of prior hemorrhage.     HPI:  Christine Leblanc is a 85 y.o. female here as a referral from Dr. Ramond Marrow . PMHx of dysphagia, former smoker, total thyroidectomy in 2010, distal esophageal web requiring dilation. Thin liquids are symptomatic. She has Dysphagia requiring mechanical soft nectar thick liquids. Swallowing problems started 40 years ago, she remembers being at her kitchen sink eating smoked sausage and  choking. But she hadn't had a problem for decades until  having dentures put in which impairs chewing. She feels her dentures make it difficult to talk. Dysphasia worsening since Christmas. Patient thinks she may have had a stroke in the past. Started at Christmas or longer. Chewing is difficult and it takes her forever to chew since her dentures, she avoids things that are hard to chew. Occ she thinks she swallows something wrong. Main problems is liquids. She has a drippy nose. Sometimes coffee comes out her nose. 20 years ago she thinks maybe she had a stroke, she was at church and she was saying something and she suddenly felt weak and she felt clammy. She went to the doctor and  she thinks she had a stroke but the doctors were not sure. She has never noticed any weakness. The swallowing difficulty is continuous, not dependent on the time of day. If she is upset she is not so careful. No FHx of neuromuscular disorders. No tremor. No diplopia. No ptosis, no headache. She does not notice any muscle jumping and no recent weight or muscle loss. No changes in voice quality. She has a lot of joint problems, Dr. Mardelle Matte replaced shoulder and knee joints. No neck pain. Grandfather had Parkinson's disease. She denies any memory changes. No problems with breathing. No difficulty with walking. No other new symptoms since December.  Reviewed notes, labs and imaging from outside physicians, which showed: TSH normal 4.47 February 2017. CBC in June 2016 showed anemia 9.4 with global and 27.9 hematocrit in 132 platelets, BMP was unremarkable.  CT of the head 11/2011: Personally reviewed and agree with the following  Findings: Age appropriate atrophy. Patchy hypodensity in the cerebral white matter bilaterally is unchanged from the prior study and compatible with chronic microvascular ischemia. No definite acute infarct. Negative for hemorrhage or mass lesion. Calvarium is intact.  Chronic  sinusitis.  IMPRESSION: Atrophy and chronic microvascular ischemic change. No acute infarct or hemorrhage.  Review of Systems: Patient complains of symptoms per HPI as well as the following symptoms: Restless legs, memory loss, difficulty swallowing, passing out, decreased energy, feeling cold, allergies, runny nose, weight loss, fatigue, swelling trouble swallowing, constipation pertinent negatives per HPI. All others negative.    Social History   Socioeconomic History  . Marital status: Widowed    Spouse name: Not on file  . Number of children: 4  . Years of education: 75  . Highest education level: Not on file  Occupational History  . 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 Occupation: Retired    Comment: Marine scientist  Social Needs  . Financial resource strain: Not on file  . Food insecurity:    Worry: Not on file    Inability: Not on file  . Transportation needs:    Medical: Not on file    Non-medical: Not on file  Tobacco Use  . Smoking status: Former Smoker    Packs/day: 0.80  Years: 20.00    Pack years: 16.00    Types: Cigarettes    Last attempt to quit: 12/31/1978    Years since quitting: 40.0  . Smokeless tobacco: Never Used  Substance and Sexual Activity  . Alcohol use: No  . Drug use: No  . Sexual activity: Never    Birth control/protection: Surgical    Comment: widowed since 2010  Lifestyle  . Physical activity:    Days per week: Not on file    Minutes per session: Not on file  . Stress: Not on file  Relationships  . Social connections:    Talks on phone: Not on file    Gets together: Not on file    Attends religious service: Not on file    Active member of club or organization: Not on file    Attends meetings of clubs or organizations: Not on file    Relationship status: Not on file  . Intimate partner violence:    Fear of current or ex partner: Not on file    Emotionally abused: Not on file    Physically abused: Not on  file    Forced sexual activity: Not on file  Other Topics Concern  . Not on file  Social History Narrative   Lives at home. Her daughter lives with her.    Caffeine use: 1 cup coffee per day    Family History  Problem Relation Age of Onset  . Heart disease Mother   . Gallbladder disease Mother   . Heart failure Mother   . Diabetes Mother   . Breast cancer Sister        30 years ago  . Diabetes Sister   . Heart attack Father   . Diabetes Maternal Grandmother   . Colon cancer Neg Hx   . Stroke Neg Hx     Past Medical History:  Diagnosis Date  . Anemia   . Asthma   . Cancer ([place])    skin Ca- ? basal cell   . Chronic back pain   . Chronic neck pain   . Degenerative joint disease    Left shoulder; cervical spine, knees & hands   . Gastroesophageal reflux disease    Hiatal hernia; distal esophageal web requiring dilatation; gastric polyps; gastritis; refuses colonoscopy  . GERD (gastroesophageal reflux disease)   . History of stress test 1990's   stress test done under the care of Dr. Lattie Haw & Dr. Gwenlyn Found, now being followed by Dr. Harl Bowie- in [place] , recently seen & told to f/U in one yr.   . Hyperlipidemia 04/27/2011  . Hypertension   . Hypothyroidism   . Lymphocytic thyroiditis   . Multiple thyroid nodules 2010   Adenomatous; thyroidectomy in 2010  . Osteoarthritis of right knee 07/27/2014  . Pneumonia    hosp. for pneumonia- long time ago   . Primary localized osteoarthrosis of right shoulder 06/21/2015  . Seizures ([place])    yes- as a child- & into adult years, states she took med. for them at one time, stopped at 32 yrs. of age   . Syncope and collapse 2007   Possible CVA in 2012 with left lower extremity weakness; refused hospitalization; CT-Atrophy and chronic microvascular ischemic change.     Past Surgical History:  Procedure Laterality Date  . ABDOMINAL HYSTERECTOMY     fibroids  . CATARACT EXTRACTION     Bilateral; redo surgery on the right for  incomplete primary procedure  . COLONOSCOPY  Remote  .  ESOPHAGOGASTRODUODENOSCOPY  12/10   Dr. 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 ESOPHAGOGASTRODUODENOSCOPY N/A 03/21/2015   Procedure: ESOPHAGOGASTRODUODENOSCOPY (EGD);  Surgeon: Danie Binder, MD;  [facility];  Service: Endoscopy;  Laterality: N/A;  1130   . EYE SURGERY    . PROLAPSED UTERINE FIBROID LIGATION     outcomed with rectocele & cystocele  . SAVORY DILATION N/A 03/21/2015   Procedure: SAVORY DILATION;  Surgeon: Danie Binder, MD;  [facility];  Service: Endoscopy;  Laterality: N/A;  . SHOULDER SURGERY     left  . TOTAL KNEE ARTHROPLASTY Right 07/27/2014   Procedure: RIGHT TOTAL KNEE ARTHROPLASTY;  Surgeon: Johnny Bridge, MD;  Location: [facility];  Service: Orthopedics;  Laterality: Right;  . TOTAL SHOULDER ARTHROPLASTY Right 06/21/2015   Procedure: RIGHT TOTAL SHOULDER ARTHROPLASTY;  Surgeon: Marchia Bond, MD;  Location: [facility];  Service: Orthopedics;  Laterality: Right;  . TOTAL THYROIDECTOMY  2010    Current Outpatient Medications  Medication Sig Dispense Refill  . aspirin 81 MG tablet Take 81 mg by mouth daily.    Marland Kitchen CALCIUM-VITAMIN D PO Take by mouth. Calcium 600 mg and Vitamin D 1000 unit. One tablet daily    . diazepam (VALIUM) 5 MG tablet Take one tablet by mouth at bedtime as needed for sleep (Patient taking differently: Take 2.5 mg by mouth at bedtime as needed (sleep). ) 30 tablet 5  . ferrous sulfate 325 (65 FE) MG tablet Take 325 mg by mouth daily with breakfast.    . ipratropium (ATROVENT) 0.03 % nasal spray Place 2 sprays into both nostrils daily as needed (allergies stuffy nose).     Marland Kitchen levocetirizine (XYZAL) 5 MG tablet Take 5 mg by mouth every evening.      Marland Kitchen levothyroxine (SYNTHROID, LEVOTHROID) 75 MCG tablet Take 1 tablet (75 mcg total) by mouth daily. 90 tablet 2  . montelukast (SINGULAIR) 10 MG tablet Take 10 mg by mouth at  bedtime.    Marland Kitchen omeprazole (PRILOSEC) 20 MG capsule 1 PO 30 mins prior to breakfast and supper (Patient taking differently: Take 40 mg by mouth daily. ) 60 capsule 5  . pravastatin (PRAVACHOL) 80 MG tablet Take 1 tablet (80 mg total) by mouth every evening. 90 tablet 3  . senna (SENOKOT) 8.6 MG tablet Take 2 tablets by mouth at bedtime.     . vitamin B-12 (CYANOCOBALAMIN) 1000 MCG tablet Take 1,000 mcg by mouth every morning.    . vitamin C (ASCORBIC ACID) 500 MG tablet Take 500 mg by mouth daily.    . vitamin E 400 UNIT capsule Take 400 Units by mouth at bedtime.     Marland Kitchen albuterol (PROVENTIL HFA) 108 (90 BASE) MCG/ACT inhaler Inhale 1-2 puffs into the lungs every 6 (six) hours as needed for wheezing or shortness of breath. Wheezing, Asthma Symptoms    . benzonatate (TESSALON) 200 MG capsule Take 200 mg by mouth 2 (two) times daily as needed.     Marland Kitchen EPINEPHrine 0.3 mg/0.3 mL IJ SOAJ injection Inject into the muscle as needed for anaphylaxis.     No current facility-administered medications for this visit.     Allergies as of 01/20/2019 - Review Complete 01/20/2019  Allergen Reaction Noted  . Celecoxib Shortness Of Breath   . Dexilant [dexlansoprazole] Anaphylaxis 05/04/2016  . Famotidine  11/04/2018    Vitals: BP (!) 152/76 (BP Location: Right Arm, Patient Position: Sitting)   Pulse 86  Ht 5\' 2"  (1.575 m)   Wt 111 lb (50.3 kg)   BMI 20.30 kg/m  Last Weight:  Wt Readings from Last 1 Encounters:  01/20/19 111 lb (50.3 kg)   Last Height:   Ht Readings from Last 1 Encounters:  01/20/19 5\' 2"  (1.575 m)   Cranial Nerves:    The pupils are equal, round, and reactive to light. Attempted funduscopic exam but could not visualize. Visual fields are full to finger confrontation. Extraocular movements are intact. Trigeminal sensation is intact. Strong mouth and eye closure but can't blow out cheeks and weakness on tongue protrusion The face is symmetric. The palate elevates in the midline.  Hearing intact. Voice is normal. Shoulder shrug is normal. The tongue has normal motion without fasciculations.  Good mouth closure strength but unable to puff out cheeks, good eye closure strength.   Coordination:    Normal finger to nose and heel to shin.   Gait:    Mildly stooped, can walk on heels and toes if you hold her hands, good arm swing, slow but not ataxic gait.   Motor Observation:    No asymmetry, has generalized atrophy, and no involuntary movements noted. Tone:    Normal muscle tone.    Posture:    Posture is normal. normal erect    Strength:4/5 deltoid and triceps weakness bilaterally, intact biceps, weakness of the intrinsic hand muscles with distal wasting, 4/5 bilat hip flexion weakness otherwise intact.      Sensation: intact to LT     Reflex Exam:  DTR's: Absent AJs otherwise deep tendon reflexes in the upper and lower extremities are brisk  bilaterally.   Toes:    The toes are downgoing bilaterally.   Clonus:    Clonus is absent     Assessment/Plan:  85 y.o. female here as a referral from Dr. Gerarda Fraction for dysphagia . PMHx of chronic dysphagia, former smoker, total thyroidectomy in 2010, distal esophageal web requiring dilation. No suggestion, in the history or on exam of Parkinson's disease, myasthenia gravis which are neurologic conditions that can cause dysphagia. She has had extensive workup including imaging and emg/ncs. She was referred by Veronda Prude   She has had episodes of brief syncope in the setting of orthostatic symptoms, anemia and a UTI. She was diagnosed in the ED the first time with dehydration and anemia. The second time she had a UTI. She may have had a UTI for some time given symptoms and improvement since treatment 2 months ago. CT of the head did not show anything acute. She has felt better since treatment of the infection. She has followed up with Dr. Nevada Crane. I don;t believe there are any primary neurologic causes, follow up with  Dr. Nevada Crane. If Dr. Nevada Crane believes she needs further neurologic workup then please let me know.   No significant progression for more than a year making motor neuron disease less likely but does have progressive significant bulbar weakness. If she had bulbar onset ALS would expect more progression in the last several years. No fasciculations noted. No significant changes in muscle wasting or weakness however she does have diffuse atrophy. All stable.  RTC as necessary    Sarina Ill, MD  [facility] [address] [place], [place] [address]  Phone [phone] Fax [phone]  A total of 30 minutes was spent face-to-face with this patient. Over half this time was spent on counseling patient on the  1. Syncope due to orthostatic hypotension  2. Orthostatic hypotension   3. Urinary tract infection without hematuria, site unspecified    and different diagnostic and therapeutic options available.

## 2019-01-20 NOTE — Patient Instructions (Signed)
Drink plenty of fluids  Orthostatic Hypotension Blood pressure is a measurement of how strongly, or weakly, your blood is pressing against the walls of your arteries. Orthostatic hypotension is a sudden drop in blood pressure that happens when you quickly change positions, such as when you get up from sitting or lying down. Arteries are blood vessels that carry blood from your heart throughout your body. When blood pressure is too low, you may not get enough blood to your brain or to the rest of your organs. This can cause weakness, light-headedness, rapid heartbeat, and fainting. This can last for just a few seconds or for up to a few minutes. Orthostatic hypotension is usually not a serious problem. However, if it happens frequently or gets worse, it may be a sign of something more serious. What are the causes? This condition may be caused by:  Sudden changes in posture, such as standing up quickly after you have been sitting or lying down.  Blood loss.  Loss of body fluids (dehydration).  Heart problems.  Hormone (endocrine) problems.  Pregnancy.  Severe infection.  Lack of certain nutrients.  Severe allergic reactions (anaphylaxis).  Certain medicines, such as blood pressure medicine or medicines that make the body lose excess fluids (diuretics). Sometimes, this condition can be caused by not taking medicine as directed, such as taking too much of a certain medicine. What increases the risk? The following factors may make you more likely to develop this condition:  Age. Risk increases as you get older.  Conditions that affect the heart or the central nervous system.  Taking certain medicines, such as blood pressure medicine or diuretics.  Being pregnant. What are the signs or symptoms? Symptoms of this condition may include:  Weakness.  Light-headedness.  Dizziness.  Blurred vision.  Fatigue.  Rapid heartbeat.  Fainting, in severe cases. How is this  diagnosed? This condition is diagnosed based on:  Your medical history.  Your symptoms.  Your blood pressure measurement. Your health care provider will check your blood pressure when you are: ? Lying down. ? Sitting. ? Standing. A blood pressure reading is recorded as two numbers, such as "120 over 80" (or 120/80). The first ("top") number is called the systolic pressure. It is a measure of the pressure in your arteries as your heart beats. The second ("bottom") number is called the diastolic pressure. It is a measure of the pressure in your arteries when your heart relaxes between beats. Blood pressure is measured in a unit called mm Hg. Healthy blood pressure for most adults is 120/80. If your blood pressure is below 90/60, you may be diagnosed with hypotension. Other information or tests that may be used to diagnose orthostatic hypotension include:  Your other vital signs, such as your heart rate and temperature.  Blood tests.  Tilt table test. For this test, you will be safely secured to a table that moves you from a lying position to an upright position. Your heart rhythm and blood pressure will be monitored during the test. How is this treated? This condition may be treated by:  Changing your diet. This may involve eating more salt (sodium) or drinking more water.  Taking medicines to raise your blood pressure.  Changing the dosage of certain medicines you are taking that might be lowering your blood pressure.  Wearing compression stockings. These stockings help to prevent blood clots and reduce swelling in your legs. In some cases, you may need to go to the hospital for:  Fluid  replacement. This means you will receive fluids through an IV.  Blood replacement. This means you will receive donated blood through an IV (transfusion).  Treating an infection or heart problems, if this applies.  Monitoring. You may need to be monitored while medicines that you are taking wear  off. Follow these instructions at home: Eating and drinking   Drink enough fluid to keep your urine pale yellow.  Eat a healthy diet, and follow instructions from your health care provider about eating or drinking restrictions. A healthy diet includes: ? Fresh fruits and vegetables. ? Whole grains. ? Lean meats. ? Low-fat dairy products.  Eat extra salt only as directed. Do not add extra salt to your diet unless your health care provider told you to do that.  Eat frequent, small meals.  Avoid standing up suddenly after eating. Medicines  Take over-the-counter and prescription medicines only as told by your health care provider. ? Follow instructions from your health care provider about changing the dosage of your current medicines, if this applies. ? Do not stop or adjust any of your medicines on your own. General instructions   Wear compression stockings as told by your health care provider.  Get up slowly from lying down or sitting positions. This gives your blood pressure a chance to adjust.  Avoid hot showers and excessive heat as directed by your health care provider.  Return to your normal activities as told by your health care provider. Ask your health care provider what activities are safe for you.  Do not use any products that contain nicotine or tobacco, such as cigarettes, e-cigarettes, and chewing tobacco. If you need help quitting, ask your health care provider.  Keep all follow-up visits as told by your health care provider. This is important. Contact a health care provider if you:  Vomit.  Have diarrhea.  Have a fever for more than 2-3 days.  Feel more thirsty than usual.  Feel weak and tired. Get help right away if you:  Have chest pain.  Have a fast or irregular heartbeat.  Develop numbness in any part of your body.  Cannot move your arms or your legs.  Have trouble speaking.  Become sweaty or feel light-headed.  Faint.  Feel short of  breath.  Have trouble staying awake.  Feel confused. Summary  Orthostatic hypotension is a sudden drop in blood pressure that happens when you quickly change positions.  Orthostatic hypotension is usually not a serious problem.  It is diagnosed by having your blood pressure taken lying down, sitting, and then standing.  It may be treated by changing your diet or adjusting your medicines. This information is not intended to replace advice given to you by your health care provider. Make sure you discuss any questions you have with your health care provider. Document Released: 12/07/2002 Document Revised: 06/12/2018 Document Reviewed: 06/12/2018 Elsevier Interactive Patient Education  [employer].

## 2019-01-21 ENCOUNTER — Telehealth: Payer: Self-pay

## 2019-01-21 NOTE — Telephone Encounter (Signed)
Called pt. No answer, unable to leave message.  

## 2019-01-21 NOTE — Telephone Encounter (Signed)
-----   Message from Arnoldo Lenis, MD sent at 01/21/2019  2:25 PM EST ----- Heart monitor looks fine, we will discuss in detail at our f/u next week  J BrancH MD

## 2019-01-22 ENCOUNTER — Telehealth: Payer: Self-pay

## 2019-01-22 NOTE — Telephone Encounter (Signed)
-----   Message from Arnoldo Lenis, MD sent at 01/21/2019  2:25 PM EST ----- Heart monitor looks fine, we will discuss in detail at our f/u next week  J BrancH MD

## 2019-01-22 NOTE — Telephone Encounter (Signed)
Called pt. No answer, unable to leave message.  

## 2019-01-26 ENCOUNTER — Telehealth: Payer: Self-pay

## 2019-01-26 NOTE — Telephone Encounter (Signed)
Have called pt several times, without a return call. We will discuss at office visit on 1/30.

## 2019-01-29 ENCOUNTER — Encounter: Payer: Self-pay | Admitting: Cardiology

## 2019-01-29 ENCOUNTER — Ambulatory Visit (INDEPENDENT_AMBULATORY_CARE_PROVIDER_SITE_OTHER): Payer: Medicare Other | Admitting: Cardiology

## 2019-01-29 VITALS — BP 118/64 | HR 82 | Ht 62.0 in | Wt 112.0 lb

## 2019-01-29 DIAGNOSIS — R55 Syncope and collapse: Secondary | ICD-10-CM | POA: Diagnosis not present

## 2019-01-29 NOTE — Patient Instructions (Addendum)
Medication Instructions:  Your physician recommends that you continue on your current medications as directed. Please refer to the Current Medication list given to you today.   Labwork: NONE  Testing/Procedures: NONE  Follow-Up: Your physician recommends that you schedule a follow-up appointment in: 3 MONTHS    Any Other Special Instructions Will Be Listed Below (If Applicab  PLEASE WORK ON HYDRATION.   If you need a refill on your cardiac medications before your next appointment, please call your pharmacy.

## 2019-01-29 NOTE — Progress Notes (Signed)
Clinical Summary Ms. Petraitis is a 85 y.o.female seen today for follow up of the following medical problems. This is a focused visit on prior episodes of near syncope.   1. Near syncope - seen 12/26/18 by PA [facility] for predominately positional dizziness with falls x 2 in October - Jan 2020 monitor showed rare ectopy, no significant arrhythmias - 1 cup of coffee in AM, 2 glasses of water, occasional soda  -no recurrent episodes since October Past Medical History:  Diagnosis Date  . Anemia   . Asthma   . Cancer ([place])    skin Ca- ? basal cell   . Chronic back pain   . Chronic neck pain   . Degenerative joint disease    Left shoulder; cervical spine, knees & hands   . Gastroesophageal reflux disease    Hiatal hernia; distal esophageal web requiring dilatation; gastric polyps; gastritis; refuses colonoscopy  . GERD (gastroesophageal reflux disease)   . History of stress test 1990's   stress test done under the care of Dr. Lattie Haw & Dr. Gwenlyn Found, now being followed by Dr. Harl Bowie- in [place] , recently seen & told to f/U in one yr.   . Hyperlipidemia 04/27/2011  . Hypertension   . Hypothyroidism   . Lymphocytic thyroiditis   . Multiple thyroid nodules 2010   Adenomatous; thyroidectomy in 2010  . Osteoarthritis of right knee 07/27/2014  . Pneumonia    hosp. for pneumonia- long time ago   . Primary localized osteoarthrosis of right shoulder 06/21/2015  . Seizures ([place])    yes- as a child- & into adult years, states she took med. for them at one time, stopped at 32 yrs. of age   . Syncope and collapse 2007   Possible CVA in 2012 with left lower extremity weakness; refused hospitalization; CT-Atrophy and chronic microvascular ischemic change.      Allergies  Allergen Reactions  . Celecoxib Shortness Of Breath  . Dexilant [Dexlansoprazole] Anaphylaxis    abd pain  . Famotidine     Makes her feel "very bad"     Current Outpatient Medications  Medication Sig Dispense  Refill  . albuterol (PROVENTIL HFA) 108 (90 BASE) MCG/ACT inhaler Inhale 1-2 puffs into the lungs every 6 (six) hours as needed for wheezing or shortness of breath. Wheezing, Asthma Symptoms    . aspirin 81 MG tablet Take 81 mg by mouth daily.    . benzonatate (TESSALON) 200 MG capsule Take 200 mg by mouth 2 (two) times daily as needed.     Marland Kitchen CALCIUM-VITAMIN D PO Take by mouth. Calcium 600 mg and Vitamin D 1000 unit. One tablet daily    . diazepam (VALIUM) 5 MG tablet Take one tablet by mouth at bedtime as needed for sleep (Patient taking differently: Take 2.5 mg by mouth at bedtime as needed (sleep). ) 30 tablet 5  . EPINEPHrine 0.3 mg/0.3 mL IJ SOAJ injection Inject into the muscle as needed for anaphylaxis.    . ferrous sulfate 325 (65 FE) MG tablet Take 325 mg by mouth daily with breakfast.    . ipratropium (ATROVENT) 0.03 % nasal spray Place 2 sprays into both nostrils daily as needed (allergies stuffy nose).     Marland Kitchen levocetirizine (XYZAL) 5 MG tablet Take 5 mg by mouth every evening.      Marland Kitchen levothyroxine (SYNTHROID, LEVOTHROID) 75 MCG tablet Take 1 tablet (75 mcg total) by mouth daily. 90 tablet 2  . montelukast (SINGULAIR) 10 MG tablet Take 10  mg by mouth at bedtime.    Marland Kitchen omeprazole (PRILOSEC) 20 MG capsule 1 PO 30 mins prior to breakfast and supper (Patient taking differently: Take 40 mg by mouth daily. ) 60 capsule 5  . pravastatin (PRAVACHOL) 80 MG tablet Take 1 tablet (80 mg total) by mouth every evening. 90 tablet 3  . senna (SENOKOT) 8.6 MG tablet Take 2 tablets by mouth at bedtime.     . vitamin B-12 (CYANOCOBALAMIN) 1000 MCG tablet Take 1,000 mcg by mouth every morning.    . vitamin C (ASCORBIC ACID) 500 MG tablet Take 500 mg by mouth daily.    . vitamin E 400 UNIT capsule Take 400 Units by mouth at bedtime.      No current facility-administered medications for this visit.      Past Surgical History:  Procedure Laterality Date  . ABDOMINAL HYSTERECTOMY     fibroids  . CATARACT  EXTRACTION     Bilateral; redo surgery on the right for incomplete primary procedure  . COLONOSCOPY  Remote  . ESOPHAGOGASTRODUODENOSCOPY  12/10   Dr. 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 ESOPHAGOGASTRODUODENOSCOPY N/A 03/21/2015   Procedure: ESOPHAGOGASTRODUODENOSCOPY (EGD);  Surgeon: Danie Binder, MD;  [facility];  Service: Endoscopy;  Laterality: N/A;  1130   . EYE SURGERY    . PROLAPSED UTERINE FIBROID LIGATION     outcomed with rectocele & cystocele  . SAVORY DILATION N/A 03/21/2015   Procedure: SAVORY DILATION;  Surgeon: Danie Binder, MD;  [facility];  Service: Endoscopy;  Laterality: N/A;  . SHOULDER SURGERY     left  . TOTAL KNEE ARTHROPLASTY Right 07/27/2014   Procedure: RIGHT TOTAL KNEE ARTHROPLASTY;  Surgeon: Johnny Bridge, MD;  Location: [place];  Service: Orthopedics;  Laterality: Right;  . TOTAL SHOULDER ARTHROPLASTY Right 06/21/2015   Procedure: RIGHT TOTAL SHOULDER ARTHROPLASTY;  Surgeon: Marchia Bond, MD;  Location: [place];  Service: Orthopedics;  Laterality: Right;  . TOTAL THYROIDECTOMY  2010     Allergies  Allergen Reactions  . Celecoxib Shortness Of Breath  . Dexilant [Dexlansoprazole] Anaphylaxis    abd pain  . Famotidine     Makes her feel "very bad"      Family History  Problem Relation Age of Onset  . Heart disease Mother   . Gallbladder disease Mother   . Heart failure Mother   . Diabetes Mother   . Breast cancer Sister        30 years ago  . Diabetes Sister   . Heart attack Father   . Diabetes Maternal Grandmother   . Colon cancer Neg Hx   . Stroke Neg Hx      Social History Ms. Sesay reports that she quit smoking about 40 years ago. Her smoking use included cigarettes. She has a 16.00 pack-year smoking history. She has never used smokeless tobacco. Ms. Garramone reports no history of alcohol use.   Review of Systems CONSTITUTIONAL: No weight loss,  fever, chills, weakness or fatigue.  HEENT: Eyes: No visual loss, blurred vision, double vision or yellow sclerae.No hearing loss, sneezing, congestion, runny nose or sore throat.  SKIN: No rash or itching.  CARDIOVASCULAR: per hpi RESPIRATORY: No shortness of breath, cough or sputum.  GASTROINTESTINAL: No anorexia, nausea, vomiting or diarrhea. No abdominal pain or blood.  GENITOURINARY: No burning on urination, no polyuria NEUROLOGICAL: No headache, dizziness, syncope, paralysis, ataxia, numbness or tingling in the extremities. No  change in bowel or bladder control.  MUSCULOSKELETAL: No muscle, back pain, joint pain or stiffness.  LYMPHATICS: No enlarged nodes. No history of splenectomy.  PSYCHIATRIC: No history of depression or anxiety.  ENDOCRINOLOGIC: No reports of sweating, cold or heat intolerance. No polyuria or polydipsia.  Marland Kitchen   Physical Examination Vitals:   01/29/19 1257  BP: 118/64  Pulse: 82  SpO2: 97%   Vitals:   01/29/19 1257  Weight: 112 lb (50.8 kg)  Height: 5\' 2"  (1.575 m)    Gen: resting comfortably, no acute distress HEENT: no scleral icterus, pupils equal round and reactive, no palptable cervical adenopathy,  CV: RRR, no m/r/g, no jvd Resp: Clear to auscultation bilaterally GI: abdomen is soft, non-tender, non-distended, normal bowel sounds, no hepatosplenomegaly MSK: extremities are warm, no edema.  Skin: warm, no rash Neuro:  no focal deficits Psych: appropriate affect     Assessment and Plan  1. Near syncope - symptoms would suggest orthostasis - recent benign cardiac monitor - no recurrent episodes since 09/2018 - continue to encourage hydration which still remaisn limited, no further cardiac workup at this time.       Arnoldo Lenis, M.D.

## 2019-02-02 ENCOUNTER — Ambulatory Visit (HOSPITAL_COMMUNITY)
Admission: RE | Admit: 2019-02-02 | Discharge: 2019-02-02 | Disposition: A | Discharge status: Home / Self Care | Payer: Medicare Other | Source: Ambulatory Visit | Attending: Internal Medicine | Admitting: Internal Medicine

## 2019-02-02 DIAGNOSIS — Z1231 Encounter for screening mammogram for malignant neoplasm of breast: Secondary | ICD-10-CM | POA: Insufficient documentation

## 2019-02-09 DIAGNOSIS — J309 Allergic rhinitis, unspecified: Secondary | ICD-10-CM | POA: Diagnosis not present

## 2019-02-09 DIAGNOSIS — Z516 Encounter for desensitization to allergens: Secondary | ICD-10-CM | POA: Diagnosis not present

## 2019-02-19 ENCOUNTER — Encounter

## 2019-02-19 ENCOUNTER — Ambulatory Visit: Payer: Medicare Other | Admitting: Gastroenterology

## 2019-02-19 ENCOUNTER — Ambulatory Visit (INDEPENDENT_AMBULATORY_CARE_PROVIDER_SITE_OTHER): Payer: Medicare Other | Admitting: Gastroenterology

## 2019-02-19 ENCOUNTER — Encounter: Payer: Self-pay | Admitting: Gastroenterology

## 2019-02-19 DIAGNOSIS — R1319 Other dysphagia: Secondary | ICD-10-CM | POA: Diagnosis not present

## 2019-02-19 DIAGNOSIS — K219 Gastro-esophageal reflux disease without esophagitis: Secondary | ICD-10-CM | POA: Diagnosis not present

## 2019-02-19 MED ORDER — OMEPRAZOLE 40 MG PO CPDR: DELAYED_RELEASE_CAPSULE | ORAL | 3 refills | Status: DC

## 2019-02-19 NOTE — Assessment & Plan Note (Signed)
WEIGHT STABLE x2 YEARS. SYMPTOMS FAIRLY WELL CONTROLLED.  DRINK WATER TO KEEP YOUR URINE LIGHT YELLOW.  FOLLOW A SOFT MECHANICAL DIET.  MEATS SHOULD BE CHOPPED OR GROUND ONLY.DO NOT EAT CHUNKS OF ANYTHING. CONTINUE OMEPRAZOLE.  TAKE 30 MINUTES PRIOR TO YOUR FIRST MEAL. YOU HAVE 40 MG TABLETS AND REFLLS FOR ONE YEAR. PLEASE CALL WITH QUESTIONS OR CONCERNS.  FOLLOW UP IN 1 YEAR.

## 2019-02-19 NOTE — Patient Instructions (Addendum)
DRINK WATER TO KEEP YOUR URINE LIGHT YELLOW.  FOLLOW A SOFT MECHANICAL DIET.  MEATS SHOULD BE CHOPPED OR GROUND ONLY.DO NOT EAT CHUNKS OF ANYTHING.   CONTINUE OMEPRAZOLE.  TAKE 30 MINUTES PRIOR TO YOUR FIRST MEAL. YOU HAVE 40 MG TABLETS AND REFLLS FOR ONE YEAR.  PLEASE CALL WITH QUESTIONS OR CONCERNS.  FOLLOW UP IN 1 YEAR.

## 2019-02-19 NOTE — Progress Notes (Signed)
ON RECALL  °

## 2019-02-19 NOTE — Assessment & Plan Note (Addendum)
SYMPTOMS FAIRLY WELL CONTROLLED.  DRINK WATER TO KEEP YOUR URINE LIGHT YELLOW. CONTINUE TO MONITOR SYMPTOMS. CONTINUE OMEPRAZOLE.  TAKE 30 MINUTES PRIOR TO YOUR FIRST MEAL. CHNAGED TO 40 MG TABLETS AND REFLLS FOR ONE YEAR. PLEASE CALL WITH QUESTIONS OR CONCERNS.  FOLLOW UP IN 1 YEAR.

## 2019-02-19 NOTE — Progress Notes (Signed)
Subjective:    Patient ID: Christine Leblanc, female    DOB: Jun 14, 1929, 85 y.o.   MRN: [id number]  Celene Squibb, MD  HPI HAD QUESTION ABOUT CHANGING PPI DOSE. ALSO WANTS TO KNOW IF SHE NEEDS TO BE CONCERNED ABOUT HER HERNIA IN THE RUQ AND SOMETIMES IN HER EPIGASTRIUM.  HURTS WHEN SHE STRAIGHTENS BACK UP. CAN HAPPEN IN THE BED. WEIGHT STABLE FOR PAST 2 YEARS. MAY FORGET TO EAT LUNCH.   WANTED TO KNOW ABOUT LOW BLOOD COUNT IN DEC 2019. EATING GRITS AND TAKING IRON PILLS. HAD BLOOD IN HER URINE AND BLADDER INFECTIONS IN OCT 2019. FEELS BETTER NOW/ DRINKING WATER WITH [employer]. BMs: EVERY DAY JUST ABOUT. SWALLOWING: ISN'T BETTER BUT SLACKING ON HER EXERCISES. TAKES IRON AND STOOLS ARE BLACK(#3-4).  PT DENIES FEVER, CHILLS, HEMATOCHEZIA, HEMATEMESIS, nausea, vomiting, melena, diarrhea, CHEST PAIN, SHORTNESS OF BREATH, CHANGE IN BOWEL IN HABITS, constipation, problems with sedation, OR heartburn or indigestion.  Past Medical History:  Diagnosis Date  . Anemia   . Asthma   . Cancer ([place])    skin Ca- ? basal cell   . Chronic back pain   . Chronic neck pain   . Degenerative joint disease    Left shoulder; cervical spine, knees & hands   . Gastroesophageal reflux disease    Hiatal hernia; distal esophageal web requiring dilatation; gastric polyps; gastritis; refuses colonoscopy  . GERD (gastroesophageal reflux disease)   . History of stress test 1990's   stress test done under the care of Dr. Lattie Haw & Dr. Gwenlyn Found, now being followed by Dr. Harl Bowie- in [place] , recently seen & told to f/U in one yr.   . Hyperlipidemia 04/27/2011  . Hypertension   . Hypothyroidism   . Lymphocytic thyroiditis   . Multiple thyroid nodules 2010   Adenomatous; thyroidectomy in 2010  . Osteoarthritis of right knee 07/27/2014  . Pneumonia    hosp. for pneumonia- long time ago   . Primary localized osteoarthrosis of right shoulder 06/21/2015  . Seizures ([place])    yes- as a child- & into adult years,  states she took med. for them at one time, stopped at 32 yrs. of age   . Syncope and collapse 2007   Possible CVA in 2012 with left lower extremity weakness; refused hospitalization; CT-Atrophy and chronic microvascular ischemic change.    Past Surgical History:  Procedure Laterality Date  . ABDOMINAL HYSTERECTOMY     fibroids  . CATARACT EXTRACTION     Bilateral; redo surgery on the right for incomplete primary procedure  . COLONOSCOPY  Remote  . ESOPHAGOGASTRODUODENOSCOPY  12/10   Dr. 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 ESOPHAGOGASTRODUODENOSCOPY N/A 03/21/2015   Procedure: ESOPHAGOGASTRODUODENOSCOPY (EGD);  Surgeon: Danie Binder, MD;  [facility];  Service: Endoscopy;  Laterality: N/A;  1130   . EYE SURGERY    . PROLAPSED UTERINE FIBROID LIGATION     outcomed with rectocele & cystocele  . SAVORY DILATION N/A 03/21/2015   Procedure: SAVORY DILATION;  Surgeon: Danie Binder, MD;  [facility];  Service: Endoscopy;  Laterality: N/A;  . SHOULDER SURGERY     left  . TOTAL KNEE ARTHROPLASTY Right 07/27/2014   Procedure: RIGHT TOTAL KNEE ARTHROPLASTY;  Surgeon: Johnny Bridge, MD;  Location: [place];  Service: Orthopedics;  Laterality: Right;  . TOTAL SHOULDER ARTHROPLASTY Right 06/21/2015   Procedure: RIGHT TOTAL SHOULDER ARTHROPLASTY;  Surgeon: Marchia Bond,  MD;  Location: [place];  Service: Orthopedics;  Laterality: Right;  . TOTAL THYROIDECTOMY  2010   Allergies  Allergen Reactions  . Celecoxib Shortness Of Breath  . Dexilant [Dexlansoprazole] Anaphylaxis    abd pain  . Famotidine     Makes her feel "very bad"   Current Outpatient Medications  Medication Sig    . albuterol (PROVENTIL HFA) 108 (90 BASE) MCG/ACT inhaler Inhale 1-2 puffs into the lungs every 6 (six) hours as needed for wheezing or shortness of breath. Wheezing, Asthma Symptoms    . aspirin 81 MG tablet Take 81 mg by mouth daily.    .  benzonatate (TESSALON) 200 MG capsule Take 200 mg by mouth 2 (two) times daily as needed.     Marland Kitchen CALCIUM-VITAMIN D PO Take by mouth. Calcium 600 mg and Vitamin D 1000 unit. One tablet daily    . diazepam (VALIUM) 5 MG tablet Take one tablet by mouth at bedtime as needed for sleep (Patient taking differently: Take 2.5 mg by mouth at bedtime as needed (sleep). )    . EPINEPHrine 0.3 mg/0.3 mL IJ SOAJ injection Inject into the muscle as needed for anaphylaxis.    . ferrous sulfate 325 (65 FE) MG tablet Take 325 mg by mouth daily with breakfast.    . ipratropium (ATROVENT) 0.03 % nasal spray Place 2 sprays into both nostrils daily as needed (allergies stuffy nose).     Marland Kitchen levocetirizine (XYZAL) 5 MG tablet Take 5 mg by mouth every evening.      Marland Kitchen levothyroxine (SYNTHROID, LEVOTHROID) 75 MCG tablet Take 1 tablet (75 mcg total) by mouth daily.    . montelukast (SINGULAIR) 10 MG tablet Take 10 mg by mouth at bedtime.    Marland Kitchen omeprazole (PRILOSEC) 20 MG capsule 1 PO 30 mins prior to breakfast and supper (Patient taking differently: Take 40 mg by mouth daily. )    . pravastatin (PRAVACHOL) 80 MG tablet Take 1 tablet (80 mg total) by mouth every evening.    . senna (SENOKOT) 8.6 MG tablet Take 2 tablets by mouth at bedtime.     . vitamin B-12 (CYANOCOBALAMIN) 1000 MCG tablet Take 1,000 mcg by mouth every morning.    . vitamin C (ASCORBIC ACID) 500 MG tablet Take 500 mg by mouth daily.    . vitamin E 400 UNIT capsule Take 400 Units by mouth at bedtime.      Review of Systems PER HPI OTHERWISE ALL SYSTEMS ARE NEGATIVE.    Objective:   Physical Exam Vitals signs reviewed.  Constitutional:      General: She is not in acute distress.    Appearance: She is well-developed.  HENT:     Head: Normocephalic and atraumatic.     Mouth/Throat:     Pharynx: No oropharyngeal exudate.  Eyes:     General: No scleral icterus.    Pupils: Pupils are equal, round, and reactive to light.  Neck:     Musculoskeletal:  Normal range of motion and neck supple.  Cardiovascular:     Rate and Rhythm: Normal rate and regular rhythm.     Heart sounds: Normal heart sounds.  Pulmonary:     Effort: Pulmonary effort is normal. No respiratory distress.     Breath sounds: Normal breath sounds.  Abdominal:     General: Bowel sounds are normal. There is no distension.     Palpations: Abdomen is soft.     Tenderness: There is no abdominal tenderness.  Musculoskeletal:  General: Deformity (spine and extremities) present.     Right lower leg: No edema.     Left lower leg: No edema.  Lymphadenopathy:     Cervical: No cervical adenopathy.  Skin:    General: Skin is warm and dry.  Neurological:     Mental Status: She is alert and oriented to person, place, and time. Mental status is at baseline.     Comments: NO  NEW FOCAL DEFICITS  Psychiatric:        Mood and Affect: Mood normal.        Behavior: Behavior normal.     Comments: FLAT AFFECT       Assessment & Plan:

## 2019-02-19 NOTE — Progress Notes (Signed)
CC'D TO PCP °

## 2019-02-25 ENCOUNTER — Other Ambulatory Visit: Payer: Self-pay | Admitting: Cardiology

## 2019-03-11 DIAGNOSIS — J309 Allergic rhinitis, unspecified: Secondary | ICD-10-CM | POA: Diagnosis not present

## 2019-03-16 ENCOUNTER — Ambulatory Visit: Payer: Medicare Other | Admitting: Endocrinology

## 2019-03-26 ENCOUNTER — Ambulatory Visit: Payer: Medicare Other | Admitting: Endocrinology

## 2019-04-02 ENCOUNTER — Ambulatory Visit: Payer: Medicare Other | Admitting: Cardiology

## 2019-04-07 DIAGNOSIS — F411 Generalized anxiety disorder: Secondary | ICD-10-CM | POA: Diagnosis not present

## 2019-04-07 DIAGNOSIS — D519 Vitamin B12 deficiency anemia, unspecified: Secondary | ICD-10-CM | POA: Diagnosis not present

## 2019-04-07 DIAGNOSIS — K219 Gastro-esophageal reflux disease without esophagitis: Secondary | ICD-10-CM | POA: Diagnosis not present

## 2019-04-07 DIAGNOSIS — J309 Allergic rhinitis, unspecified: Secondary | ICD-10-CM | POA: Diagnosis not present

## 2019-04-07 DIAGNOSIS — E039 Hypothyroidism, unspecified: Secondary | ICD-10-CM | POA: Diagnosis not present

## 2019-04-07 DIAGNOSIS — E782 Mixed hyperlipidemia: Secondary | ICD-10-CM | POA: Diagnosis not present

## 2019-04-07 DIAGNOSIS — I10 Essential (primary) hypertension: Secondary | ICD-10-CM | POA: Diagnosis not present

## 2019-04-07 DIAGNOSIS — D638 Anemia in other chronic diseases classified elsewhere: Secondary | ICD-10-CM | POA: Diagnosis not present

## 2019-04-16 ENCOUNTER — Other Ambulatory Visit (HOSPITAL_COMMUNITY): Payer: Medicare Other

## 2019-04-23 ENCOUNTER — Ambulatory Visit (HOSPITAL_COMMUNITY): Payer: Medicare Other | Admitting: Hematology

## 2019-05-06 DIAGNOSIS — Z6821 Body mass index (BMI) 21.0-21.9, adult: Secondary | ICD-10-CM | POA: Diagnosis not present

## 2019-05-06 DIAGNOSIS — D519 Vitamin B12 deficiency anemia, unspecified: Secondary | ICD-10-CM | POA: Diagnosis not present

## 2019-05-06 DIAGNOSIS — D638 Anemia in other chronic diseases classified elsewhere: Secondary | ICD-10-CM | POA: Diagnosis not present

## 2019-05-06 DIAGNOSIS — K219 Gastro-esophageal reflux disease without esophagitis: Secondary | ICD-10-CM | POA: Diagnosis not present

## 2019-05-06 DIAGNOSIS — Z516 Encounter for desensitization to allergens: Secondary | ICD-10-CM | POA: Diagnosis not present

## 2019-05-06 DIAGNOSIS — I10 Essential (primary) hypertension: Secondary | ICD-10-CM | POA: Diagnosis not present

## 2019-05-06 DIAGNOSIS — E039 Hypothyroidism, unspecified: Secondary | ICD-10-CM | POA: Diagnosis not present

## 2019-05-06 DIAGNOSIS — D631 Anemia in chronic kidney disease: Secondary | ICD-10-CM | POA: Diagnosis not present

## 2019-05-06 DIAGNOSIS — E785 Hyperlipidemia, unspecified: Secondary | ICD-10-CM | POA: Diagnosis not present

## 2019-05-06 DIAGNOSIS — F411 Generalized anxiety disorder: Secondary | ICD-10-CM | POA: Diagnosis not present

## 2019-05-06 DIAGNOSIS — J302 Other seasonal allergic rhinitis: Secondary | ICD-10-CM | POA: Diagnosis not present

## 2019-05-06 DIAGNOSIS — E782 Mixed hyperlipidemia: Secondary | ICD-10-CM | POA: Diagnosis not present

## 2019-05-14 DIAGNOSIS — M8580 Other specified disorders of bone density and structure, unspecified site: Secondary | ICD-10-CM | POA: Diagnosis not present

## 2019-05-14 DIAGNOSIS — K219 Gastro-esophageal reflux disease without esophagitis: Secondary | ICD-10-CM | POA: Diagnosis not present

## 2019-05-14 DIAGNOSIS — E782 Mixed hyperlipidemia: Secondary | ICD-10-CM | POA: Diagnosis not present

## 2019-05-14 DIAGNOSIS — E039 Hypothyroidism, unspecified: Secondary | ICD-10-CM | POA: Diagnosis not present

## 2019-05-14 DIAGNOSIS — J302 Other seasonal allergic rhinitis: Secondary | ICD-10-CM | POA: Diagnosis not present

## 2019-05-14 DIAGNOSIS — D519 Vitamin B12 deficiency anemia, unspecified: Secondary | ICD-10-CM | POA: Diagnosis not present

## 2019-05-14 DIAGNOSIS — D638 Anemia in other chronic diseases classified elsewhere: Secondary | ICD-10-CM | POA: Diagnosis not present

## 2019-05-14 DIAGNOSIS — F411 Generalized anxiety disorder: Secondary | ICD-10-CM | POA: Diagnosis not present

## 2019-05-14 DIAGNOSIS — I10 Essential (primary) hypertension: Secondary | ICD-10-CM | POA: Diagnosis not present

## 2019-05-20 DIAGNOSIS — J300 Vasomotor rhinitis: Secondary | ICD-10-CM | POA: Diagnosis not present

## 2019-05-20 DIAGNOSIS — K219 Gastro-esophageal reflux disease without esophagitis: Secondary | ICD-10-CM | POA: Diagnosis not present

## 2019-05-20 DIAGNOSIS — J4530 Mild persistent asthma, uncomplicated: Secondary | ICD-10-CM | POA: Diagnosis not present

## 2019-05-20 DIAGNOSIS — J3089 Other allergic rhinitis: Secondary | ICD-10-CM | POA: Diagnosis not present

## 2019-05-27 DIAGNOSIS — Z516 Encounter for desensitization to allergens: Secondary | ICD-10-CM | POA: Diagnosis not present

## 2019-05-27 DIAGNOSIS — J302 Other seasonal allergic rhinitis: Secondary | ICD-10-CM | POA: Diagnosis not present

## 2019-05-29 DIAGNOSIS — H52203 Unspecified astigmatism, bilateral: Secondary | ICD-10-CM | POA: Diagnosis not present

## 2019-05-29 DIAGNOSIS — H21233 Degeneration of iris (pigmentary), bilateral: Secondary | ICD-10-CM | POA: Diagnosis not present

## 2019-05-29 DIAGNOSIS — Z961 Presence of intraocular lens: Secondary | ICD-10-CM | POA: Diagnosis not present

## 2019-05-29 DIAGNOSIS — H1789 Other corneal scars and opacities: Secondary | ICD-10-CM | POA: Diagnosis not present

## 2019-06-04 ENCOUNTER — Ambulatory Visit (INDEPENDENT_AMBULATORY_CARE_PROVIDER_SITE_OTHER): Payer: Medicare Other | Admitting: Endocrinology

## 2019-06-04 ENCOUNTER — Other Ambulatory Visit: Payer: Self-pay

## 2019-06-04 ENCOUNTER — Encounter: Payer: Self-pay | Admitting: Endocrinology

## 2019-06-04 DIAGNOSIS — E890 Postprocedural hypothyroidism: Secondary | ICD-10-CM

## 2019-06-04 NOTE — Progress Notes (Signed)
Christine Leblanc 85 y.o.  Today's office visit was provided via telemedicine using a telephone call to the patient Patient has been explained the limitations of evaluation and management by telemedicine and the availability of in person appointments.  The patient understood the limitations and agreed to proceed. Patient also understood that the telehealth visit is billable. . Location of the patient: Home . 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  She has been followed every 6-12 months for her thyroid supplementation and monitoring of her levels However has not been seen in follow-up since 9/18  She previously had been on 100 mcg since 03/2014  This was subsequently reduced down to 75 g once daily and the dose has been continued unchanged  She feels overall fairly good although tends to have some fatigue as before She has gained a couple of pounds and at home her weight was 115 No sluggishness, cold intolerance recently  She has been able to remember to take her levothyroxine consistently in the morning before breakfast with water As before she takes Prilosec also  She does take iron now and is taking this 2 hours after Synthroid  TSH done by PCP in 05/2019 was 3.8, previously 2.2  Wt Readings from Last 3 Encounters:  02/19/19 113 lb 12.8 oz (51.6 kg)  01/29/19 112 lb (50.8 kg)  01/20/19 111 lb (50.3 kg)   Lab Results  Component Value Date   TSH 2.19 09/11/2018   TSH 1.150 09/12/2017   TSH 0.13 (L) 05/01/2017   FREET4 1.22 09/11/2018   FREET4 1.78 (H) 09/12/2017   FREET4 1.75 (H) 05/01/2017       Allergies as of 06/04/2019    Reactions   Celecoxib Shortness Of Breath   Dexilant [dexlansoprazole] Anaphylaxis   abd pain   Famotidine    Makes her feel "very bad"      Medication List       Accurate as of June 04, 2019  1:23 PM. If you have any questions, ask your nurse or doctor.        aspirin 81 MG tablet Take 81 mg by mouth daily.   benzonatate 200 MG capsule Commonly known as:  TESSALON Take 200 mg by mouth 2 (two) times daily as needed.   CALCIUM-VITAMIN D PO Take by mouth. Calcium 600 mg and Vitamin D 1000 unit. One tablet daily   diazepam 5 MG tablet Commonly known as:  VALIUM Take one tablet by mouth at bedtime as needed for sleep What changed:    how much to take  how to take this  when to take this  reasons to take this  additional instructions   EPINEPHrine 0.3 mg/0.3 mL Soaj injection Commonly known as:  EPI-PEN Inject into the muscle as needed for anaphylaxis.   ferrous sulfate 325 (65 FE) MG tablet Take 325 mg by mouth daily with breakfast.   ipratropium 0.03 % nasal spray Commonly known as:  ATROVENT Place 2 sprays into both nostrils daily as needed (allergies stuffy nose).   levocetirizine 5 MG tablet Commonly known as:  XYZAL Take 5 mg by mouth every evening.   levothyroxine 75 MCG tablet  Commonly known as:  SYNTHROID Take 1 tablet (75 mcg total) by mouth daily.   montelukast 10 MG tablet Commonly known as:  SINGULAIR Take 10 mg by mouth at bedtime.   omeprazole 40 MG capsule Commonly known as:  PRILOSEC 1 PO 30 MINS PRIOR TO BREAKFAST   pravastatin 80 MG tablet Commonly known as:  PRAVACHOL TAKE 1 TABLET EVERY EVENING (NEEDS APPOINTMENT FOR REFILLS, CALL [phone])   Proventil HFA 108 (90 Base) MCG/ACT inhaler Generic drug:  albuterol Inhale 1-2 puffs into the lungs every 6 (six) hours as needed for wheezing or shortness of breath. Wheezing, Asthma Symptoms   senna 8.6 MG tablet Commonly known as:  SENOKOT Take 2 tablets by mouth at bedtime.    vitamin B-12 1000 MCG tablet Commonly known as:  CYANOCOBALAMIN Take 1,000 mcg by mouth every morning.   vitamin C 500 MG tablet Commonly known as:  ASCORBIC ACID Take 500 mg by mouth daily.   vitamin E 400 UNIT capsule Take 400 Units by mouth at bedtime.       Allergies:  Allergies  Allergen Reactions  . Celecoxib Shortness Of Breath  . Dexilant [Dexlansoprazole] Anaphylaxis    abd pain  . Famotidine     Makes her feel "very bad"    Past Medical History:  Diagnosis Date  . Anemia   . Asthma   . Cancer ([place])    skin Ca- ? basal cell   . Chronic back pain   . Chronic neck pain   . Degenerative joint disease    Left shoulder; cervical spine, knees & hands   . Gastroesophageal reflux disease    Hiatal hernia; distal esophageal web requiring dilatation; gastric polyps; gastritis; refuses colonoscopy  . GERD (gastroesophageal reflux disease)   . History of stress test 1990's   stress test done under the care of Dr. Lattie Haw & Dr. Gwenlyn Found, now being followed by Dr. Harl Bowie- in [place] , recently seen & told to f/U in one yr.   . Hyperlipidemia 04/27/2011  . Hypertension   . Hypothyroidism   . Lymphocytic thyroiditis   . Multiple thyroid nodules 2010   Adenomatous; thyroidectomy in 2010  . Osteoarthritis of right knee 07/27/2014  . Pneumonia    hosp. for pneumonia- long time ago   . Primary localized osteoarthrosis of right shoulder 06/21/2015  . Seizures ([place])    yes- as a child- & into adult years, states she took med. for them at one time, stopped at 32 yrs. of age   . Syncope and collapse 2007   Possible CVA in 2012 with left lower extremity weakness; refused hospitalization; CT-Atrophy and chronic microvascular ischemic change.     Past Surgical History:  Procedure Laterality Date  . ABDOMINAL HYSTERECTOMY     fibroids  . CATARACT EXTRACTION     Bilateral; redo surgery on the right for incomplete primary procedure  . COLONOSCOPY  Remote  .  ESOPHAGOGASTRODUODENOSCOPY  12/10   Dr. 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 ESOPHAGOGASTRODUODENOSCOPY N/A 03/21/2015   Procedure: ESOPHAGOGASTRODUODENOSCOPY (EGD);  Surgeon: Danie Binder, MD;  [facility];  Service: Endoscopy;  Laterality: N/A;  1130   . EYE SURGERY    . PROLAPSED UTERINE FIBROID LIGATION     outcomed with rectocele & cystocele  . SAVORY DILATION N/A 03/21/2015   Procedure: SAVORY DILATION;  Surgeon: Danie Binder, MD;  [facility];  Service: Endoscopy;  Laterality: N/A;  .  SHOULDER SURGERY     left  . TOTAL KNEE ARTHROPLASTY Right 07/27/2014   Procedure: RIGHT TOTAL KNEE ARTHROPLASTY;  Surgeon: Johnny Bridge, MD;  Location: [place];  Service: Orthopedics;  Laterality: Right;  . TOTAL SHOULDER ARTHROPLASTY Right 06/21/2015   Procedure: RIGHT TOTAL SHOULDER ARTHROPLASTY;  Surgeon: Marchia Bond, MD;  Location: [place];  Service: Orthopedics;  Laterality: Right;  . TOTAL THYROIDECTOMY  2010    Family History  Problem Relation Age of Onset  . Heart disease Mother   . Gallbladder disease Mother   . Heart failure Mother   . Diabetes Mother   . Breast cancer Sister        30 years ago  . Diabetes Sister   . Heart attack Father   . Diabetes Maternal Grandmother   . Colon cancer Neg Hx   . Stroke Neg Hx     Social History:  reports that she quit smoking about 40 years ago. Her smoking use included cigarettes. She has a 16.00 pack-year smoking history. She has never used smokeless tobacco. She reports that she does not drink alcohol or use drugs.  REVIEW Of SYSTEMS:  ROS  Weight History: Her weight today at home is 115  Wt Readings from Last 3 Encounters:  02/19/19 113 lb 12.8 oz (51.6 kg)  01/29/19 112 lb (50.8 kg)  01/20/19 111 lb (50.3 kg)    History of hypercholesterolemia followed by PCP   She checks her blood pressure at home, recently high normal with pulse about 80    Examination:   There were no vitals taken for this visit.      Assessments   Hypothyroidism, postsurgical and long-standing  She has been taking a stable dose of 75 mcg levothyroxine now She takes this regularly in the morning but as before taking Prilosec at the same time She will make sure she takes her iron supplement 2 hours later Has been taking her thyroid supplement consistently every morning  No new symptoms of unusual fatigue Weight is slightly better  She has several other problems including near syncope but no recent issues with this No difficulties with memory  She will continue 75 mcg of levothyroxine even though her TSH is high normal which is appropriate for her age  Follow-up in 6 months   Duration of telephone encounter = 6-1/2 minutes  There are no Patient Instructions on file for this visit.   Elayne Snare 06/04/2019, 1:23 PM

## 2019-06-23 ENCOUNTER — Ambulatory Visit: Payer: Medicare Other | Admitting: Cardiology

## 2019-06-25 DIAGNOSIS — J302 Other seasonal allergic rhinitis: Secondary | ICD-10-CM | POA: Diagnosis not present

## 2019-07-20 DIAGNOSIS — Z96651 Presence of right artificial knee joint: Secondary | ICD-10-CM | POA: Diagnosis not present

## 2019-07-22 ENCOUNTER — Encounter: Payer: Self-pay | Admitting: Cardiology

## 2019-07-22 ENCOUNTER — Encounter: Payer: Self-pay | Admitting: Internal Medicine

## 2019-07-22 ENCOUNTER — Telehealth (INDEPENDENT_AMBULATORY_CARE_PROVIDER_SITE_OTHER): Payer: Medicare Other | Admitting: Cardiology

## 2019-07-22 ENCOUNTER — Other Ambulatory Visit: Payer: Self-pay

## 2019-07-22 VITALS — BP 150/73 | HR 77 | Ht 62.0 in | Wt 115.0 lb

## 2019-07-22 DIAGNOSIS — Z8673 Personal history of transient ischemic attack (TIA), and cerebral infarction without residual deficits: Secondary | ICD-10-CM | POA: Diagnosis not present

## 2019-07-22 DIAGNOSIS — I10 Essential (primary) hypertension: Secondary | ICD-10-CM

## 2019-07-22 DIAGNOSIS — R55 Syncope and collapse: Secondary | ICD-10-CM

## 2019-07-22 DIAGNOSIS — E782 Mixed hyperlipidemia: Secondary | ICD-10-CM

## 2019-07-22 NOTE — Progress Notes (Signed)
Virtual Visit via Telephone Note   This visit type was conducted due to national recommendations for restrictions regarding the COVID-19 Pandemic (e.g. social distancing) in an effort to limit this patient's exposure and mitigate transmission in our community.  Due to her co-morbid illnesses, this patient is at least at moderate risk for complications without adequate follow up.  This format is felt to be most appropriate for this patient at this time.  The patient did not have access to video technology/had technical difficulties with video requiring transitioning to audio format only (telephone).  All issues noted in this document were discussed and addressed.  No physical exam could be performed with this format.  Please refer to the patient's chart for her  consent to telehealth for [facility].   Date:  07/22/2019   ID:  Christine Leblanc, DOB 05/16/1929, MRN [id number]  Patient Location: Home Provider Location: Office  PCP:  Celene Squibb, MD  Cardiologist:  Carlyle Dolly, MD  Electrophysiologist:  None   Evaluation Performed:  Follow-Up Visit  Chief Complaint:  Follow up   History of Present Illness:    Christine Leblanc is a 85 y.o. female seen today for follow up of the following medical problems.   1. Near syncope - seen 12/26/18 by [facility] for predominately positional dizziness with falls x 2 in October - Jan 2020 monitor showed rare ectopy, no significant arrhythmias   - denies any recent dizziness. Some unsteadiness on feet she attributes to balance issues. Recent mechanical fall.  - working to stay hydrated.   2. HTN - she is compliant with meds - elevated initially, on her repeat it was down to 135/72.    3. Hyperlipidemia - 08/2016 TC 145 TG 48 HDL 58 LDL 77 - labs followed by pcp, compliant with statin  4. Hx of TIA - on ASA and statin for secondary prevention - no recent neuro symptoms    The patient does not have symptoms concerning for COVID-19  infection (fever, chills, cough, or new shortness of breath).    Past Medical History:  Diagnosis Date  . Anemia   . Asthma   . Cancer ([place])    skin Ca- ? basal cell   . Chronic back pain   . Chronic neck pain   . Degenerative joint disease    Left shoulder; cervical spine, knees & hands   . Gastroesophageal reflux disease    Hiatal hernia; distal esophageal web requiring dilatation; gastric polyps; gastritis; refuses colonoscopy  . GERD (gastroesophageal reflux disease)   . History of stress test 1990's   stress test done under the care of Dr. Lattie Haw & Dr. Gwenlyn Found, now being followed by Dr. Harl Bowie- in [place] , recently seen & told to f/U in one yr.   . Hyperlipidemia 04/27/2011  . Hypertension   . Hypothyroidism   . Lymphocytic thyroiditis   . Multiple thyroid nodules 2010   Adenomatous; thyroidectomy in 2010  . Osteoarthritis of right knee 07/27/2014  . Pneumonia    hosp. for pneumonia- long time ago   . Primary localized osteoarthrosis of right shoulder 06/21/2015  . Seizures ([place])    yes- as a child- & into adult years, states she took med. for them at one time, stopped at 32 yrs. of age   . Syncope and collapse 2007   Possible CVA in 2012 with left lower extremity weakness; refused hospitalization; CT-Atrophy and chronic microvascular ischemic change.    Past Surgical History:  Procedure  Laterality Date  . ABDOMINAL HYSTERECTOMY     fibroids  . CATARACT EXTRACTION     Bilateral; redo surgery on the right for incomplete primary procedure  . COLONOSCOPY  Remote  . ESOPHAGOGASTRODUODENOSCOPY  12/10   Dr. 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 ESOPHAGOGASTRODUODENOSCOPY N/A 03/21/2015   Procedure: ESOPHAGOGASTRODUODENOSCOPY (EGD);  Surgeon: Danie Binder, MD;  [facility];  Service: Endoscopy;  Laterality: N/A;  1130   . EYE SURGERY    . PROLAPSED UTERINE FIBROID LIGATION     outcomed with rectocele &  cystocele  . SAVORY DILATION N/A 03/21/2015   Procedure: SAVORY DILATION;  Surgeon: Danie Binder, MD;  [facility];  Service: Endoscopy;  Laterality: N/A;  . SHOULDER SURGERY     left  . TOTAL KNEE ARTHROPLASTY Right 07/27/2014   Procedure: RIGHT TOTAL KNEE ARTHROPLASTY;  Surgeon: Johnny Bridge, MD;  Location: [place];  Service: Orthopedics;  Laterality: Right;  . TOTAL SHOULDER ARTHROPLASTY Right 06/21/2015   Procedure: RIGHT TOTAL SHOULDER ARTHROPLASTY;  Surgeon: Marchia Bond, MD;  Location: [place];  Service: Orthopedics;  Laterality: Right;  . TOTAL THYROIDECTOMY  2010     Current Meds  Medication Sig  . albuterol (PROVENTIL HFA) 108 (90 BASE) MCG/ACT inhaler Inhale 1-2 puffs into the lungs every 6 (six) hours as needed for wheezing or shortness of breath. Wheezing, Asthma Symptoms  . aspirin 81 MG tablet Take 81 mg by mouth daily.  . benzonatate (TESSALON) 200 MG capsule Take 200 mg by mouth 2 (two) times daily as needed.   Marland Kitchen CALCIUM-VITAMIN D PO Take by mouth. Calcium 600 mg and Vitamin D 1000 unit. One tablet daily  . diazepam (VALIUM) 5 MG tablet Take one tablet by mouth at bedtime as needed for sleep (Patient taking differently: Take 2.5 mg by mouth at bedtime as needed (sleep). )  . EPINEPHrine 0.3 mg/0.3 mL IJ SOAJ injection Inject into the muscle as needed for anaphylaxis.  . ferrous sulfate 325 (65 FE) MG tablet Take 325 mg by mouth daily with breakfast.  . ipratropium (ATROVENT) 0.03 % nasal spray Place 2 sprays into both nostrils daily as needed (allergies stuffy nose).   Marland Kitchen levocetirizine (XYZAL) 5 MG tablet Take 5 mg by mouth every evening.    Marland Kitchen levothyroxine (SYNTHROID, LEVOTHROID) 75 MCG tablet Take 1 tablet (75 mcg total) by mouth daily.  . montelukast (SINGULAIR) 10 MG tablet Take 10 mg by mouth at bedtime.  Marland Kitchen omeprazole (PRILOSEC) 40 MG capsule 1 PO 30 MINS PRIOR TO BREAKFAST  . pravastatin (PRAVACHOL) 80 MG tablet TAKE 1 TABLET EVERY EVENING (NEEDS  APPOINTMENT FOR REFILLS, CALL [phone])  . senna (SENOKOT) 8.6 MG tablet Take 2 tablets by mouth at bedtime.   . vitamin B-12 (CYANOCOBALAMIN) 1000 MCG tablet Take 1,000 mcg by mouth every morning.  . vitamin C (ASCORBIC ACID) 500 MG tablet Take 500 mg by mouth daily.  . vitamin E 400 UNIT capsule Take 400 Units by mouth at bedtime.      Allergies:   Celecoxib, Dexilant [dexlansoprazole], and Famotidine   Social History   Tobacco Use  . Smoking status: Former Smoker    Packs/day: 0.80    Years: 20.00    Pack years: 16.00    Types: Cigarettes    Quit date: 12/31/1978    Years since quitting: 40.5  . Smokeless tobacco: Never Used  Substance Use Topics  . Alcohol use: No  .  Drug use: No     Family Hx: The patient's family history includes Breast cancer in her sister; Diabetes in her maternal grandmother, mother, and sister; Gallbladder disease in her mother; Heart attack in her father; Heart disease in her mother; Heart failure in her mother. There is no history of Colon cancer or Stroke.  ROS:   Please see the history of present illness.     All other systems reviewed and are negative.   Prior CV studies:   The following studies were reviewed today:   Labs/Other Tests and Data Reviewed:    EKG:  n/a  Recent Labs: 09/11/2018: TSH 2.19 11/04/2018: Magnesium 1.8 12/02/2018: ALT 15; BUN 24; Creatinine, Ser 1.14; Hemoglobin 9.6; Platelets 227; Potassium 3.4; Sodium 142   Recent Lipid Panel Lab Results  Component Value Date/Time   CHOL 145 09/20/2016 09:51 AM   TRIG 48 09/20/2016 09:51 AM   HDL 58 09/20/2016 09:51 AM   CHOLHDL 2.5 09/20/2016 09:51 AM   LDLCALC 77 09/20/2016 09:51 AM    Wt Readings from Last 3 Encounters:  07/22/19 115 lb (52.2 kg)  02/19/19 113 lb 12.8 oz (51.6 kg)  01/29/19 112 lb (50.8 kg)     Objective:    Vital Signs:  BP (!) 150/73   Pulse 77   Ht 5\' 2"  (1.575 m)   Wt 115 lb (52.2 kg)   BMI 21.03 kg/m    Normal affect. Normal speech  pattern and tone. Comfortable, no apparent distress. No audible signs of SOb or wheezing.   ASSESSMENT & PLAN:    1. Near syncope - prior symptoms would suggest orthostasis - no significant symptoms recently, working to stay well hydrated. Continue to monitor   2. HTN - bp's look good. Off bp meds currently, would accept higher bp's for her in general due to prior issues with orthostasis  3. Hyperlipidemia - continue statin, request pcp labs   4. History of TIA - secondary prevention with ASA and statin, no ACE-I due to prior issues with orthostasis   COVID-19 Education: The signs and symptoms of COVID-19 were discussed with the patient and how to seek care for testing (follow up with PCP or arrange E-visit).  The importance of social distancing was discussed today.  Time:   Today, I have spent 18 minutes with the patient with telehealth technology discussing the above problems.     Medication Adjustments/Labs and Tests Ordered: Current medicines are reviewed at length with the patient today.  Concerns regarding medicines are outlined above.   Tests Ordered: No orders of the defined types were placed in this encounter.   Medication Changes: No orders of the defined types were placed in this encounter.   Follow Up:  In Person in 6 month(s)  Signed, Carlyle Dolly, MD  07/22/2019 12:34 PM    [facility]

## 2019-07-22 NOTE — Patient Instructions (Signed)
Medication Instructions:  Your physician recommends that you continue on your current medications as directed. Please refer to the Current Medication list given to you today.   Labwork: I will request labs from pcp  Testing/Procedures: none  Follow-Up: Your physician wants you to follow-up in: 6 months.  You will receive a reminder letter in the mail two months in advance. If you don't receive a letter, please call our office to schedule the follow-up appointment.   Any Other Special Instructions Will Be Listed Below (If Applicable).     If you need a refill on your cardiac medications before your next appointment, please call your pharmacy.   

## 2019-07-24 DIAGNOSIS — J3089 Other allergic rhinitis: Secondary | ICD-10-CM | POA: Diagnosis not present

## 2019-07-24 DIAGNOSIS — J301 Allergic rhinitis due to pollen: Secondary | ICD-10-CM | POA: Diagnosis not present

## 2019-08-06 DIAGNOSIS — J302 Other seasonal allergic rhinitis: Secondary | ICD-10-CM | POA: Diagnosis not present

## 2019-08-21 DIAGNOSIS — S90426A Blister (nonthermal), unspecified lesser toe(s), initial encounter: Secondary | ICD-10-CM | POA: Diagnosis not present

## 2019-09-01 DIAGNOSIS — S90424D Blister (nonthermal), right lesser toe(s), subsequent encounter: Secondary | ICD-10-CM | POA: Diagnosis not present

## 2019-09-03 DIAGNOSIS — J302 Other seasonal allergic rhinitis: Secondary | ICD-10-CM | POA: Diagnosis not present

## 2019-09-05 DIAGNOSIS — M533 Sacrococcygeal disorders, not elsewhere classified: Secondary | ICD-10-CM | POA: Diagnosis not present

## 2019-09-05 DIAGNOSIS — M25562 Pain in left knee: Secondary | ICD-10-CM | POA: Diagnosis not present

## 2019-09-07 ENCOUNTER — Other Ambulatory Visit: Payer: Self-pay | Admitting: Endocrinology

## 2019-09-08 DIAGNOSIS — M25512 Pain in left shoulder: Secondary | ICD-10-CM | POA: Diagnosis not present

## 2019-09-08 DIAGNOSIS — W19XXXA Unspecified fall, initial encounter: Secondary | ICD-10-CM | POA: Diagnosis not present

## 2019-09-08 DIAGNOSIS — M79606 Pain in leg, unspecified: Secondary | ICD-10-CM | POA: Diagnosis not present

## 2019-09-10 ENCOUNTER — Ambulatory Visit (INDEPENDENT_AMBULATORY_CARE_PROVIDER_SITE_OTHER): Payer: Medicare Other | Admitting: Otolaryngology

## 2019-09-10 DIAGNOSIS — J302 Other seasonal allergic rhinitis: Secondary | ICD-10-CM | POA: Diagnosis not present

## 2019-09-10 DIAGNOSIS — H903 Sensorineural hearing loss, bilateral: Secondary | ICD-10-CM

## 2019-09-18 DIAGNOSIS — J302 Other seasonal allergic rhinitis: Secondary | ICD-10-CM | POA: Diagnosis not present

## 2019-09-25 DIAGNOSIS — J302 Other seasonal allergic rhinitis: Secondary | ICD-10-CM | POA: Diagnosis not present

## 2019-10-22 DIAGNOSIS — J302 Other seasonal allergic rhinitis: Secondary | ICD-10-CM | POA: Diagnosis not present

## 2019-11-20 DIAGNOSIS — Z516 Encounter for desensitization to allergens: Secondary | ICD-10-CM | POA: Diagnosis not present

## 2019-11-20 DIAGNOSIS — J302 Other seasonal allergic rhinitis: Secondary | ICD-10-CM | POA: Diagnosis not present

## 2019-11-30 ENCOUNTER — Other Ambulatory Visit (HOSPITAL_COMMUNITY): Payer: Medicare Other

## 2019-11-30 ENCOUNTER — Inpatient Hospital Stay (HOSPITAL_COMMUNITY): Payer: Medicare Other | Attending: Internal Medicine

## 2019-11-30 ENCOUNTER — Other Ambulatory Visit: Payer: Self-pay

## 2019-11-30 DIAGNOSIS — D472 Monoclonal gammopathy: Secondary | ICD-10-CM | POA: Diagnosis not present

## 2019-11-30 LAB — CBC WITH DIFFERENTIAL/PLATELET
Abs Immature Granulocytes: 0.01 10*3/uL (ref 0.00–0.07)
Basophils Absolute: 0.1 10*3/uL (ref 0.0–0.1)
Basophils Relative: 1 %
Eosinophils Absolute: 0.7 10*3/uL — ABNORMAL HIGH (ref 0.0–0.5)
Eosinophils Relative: 10 %
HCT: 36.2 % (ref 36.0–46.0)
Hemoglobin: 11.5 g/dL — ABNORMAL LOW (ref 12.0–15.0)
Immature Granulocytes: 0 %
Lymphocytes Relative: 21 %
Lymphs Abs: 1.5 10*3/uL (ref 0.7–4.0)
MCH: 30.2 pg (ref 26.0–34.0)
MCHC: 31.8 g/dL (ref 30.0–36.0)
MCV: 95 fL (ref 80.0–100.0)
Monocytes Absolute: 0.7 10*3/uL (ref 0.1–1.0)
Monocytes Relative: 10 %
Neutro Abs: 4.2 10*3/uL (ref 1.7–7.7)
Neutrophils Relative %: 58 %
Platelets: 195 10*3/uL (ref 150–400)
RBC: 3.81 MIL/uL — ABNORMAL LOW (ref 3.87–5.11)
RDW: 13.2 % (ref 11.5–15.5)
WBC: 7.2 10*3/uL (ref 4.0–10.5)
nRBC: 0 % (ref 0.0–0.2)

## 2019-11-30 LAB — COMPREHENSIVE METABOLIC PANEL
ALT: 17 U/L (ref 0–44)
AST: 25 U/L (ref 15–41)
Albumin: 3.5 g/dL (ref 3.5–5.0)
Alkaline Phosphatase: 54 U/L (ref 38–126)
Anion gap: 7 (ref 5–15)
BUN: 19 mg/dL (ref 8–23)
CO2: 27 mmol/L (ref 22–32)
Calcium: 8.8 mg/dL — ABNORMAL LOW (ref 8.9–10.3)
Chloride: 106 mmol/L (ref 98–111)
Creatinine, Ser: 1.21 mg/dL — ABNORMAL HIGH (ref 0.44–1.00)
GFR calc Af Amer: 46 mL/min — ABNORMAL LOW (ref >60)
GFR calc non Af Amer: 40 mL/min — ABNORMAL LOW (ref >60)
Glucose, Bld: 102 mg/dL — ABNORMAL HIGH (ref 70–99)
Potassium: 3.7 mmol/L (ref 3.5–5.1)
Sodium: 140 mmol/L (ref 135–145)
Total Bilirubin: 0.3 mg/dL (ref 0.3–1.2)
Total Protein: 6.7 g/dL (ref 6.5–8.1)

## 2019-11-30 LAB — LACTATE DEHYDROGENASE: LDH: 151 U/L (ref 98–192)

## 2019-12-01 LAB — PROTEIN ELECTROPHORESIS, SERUM
A/G Ratio: 1.2 (ref 0.7–1.7)
Albumin ELP: 3.6 g/dL (ref 2.9–4.4)
Alpha-1-Globulin: 0.2 g/dL (ref 0.0–0.4)
Alpha-2-Globulin: 0.7 g/dL (ref 0.4–1.0)
Beta Globulin: 0.9 g/dL (ref 0.7–1.3)
Gamma Globulin: 1.1 g/dL (ref 0.4–1.8)
Globulin, Total: 2.9 g/dL (ref 2.2–3.9)
Total Protein ELP: 6.5 g/dL (ref 6.0–8.5)

## 2019-12-01 LAB — KAPPA/LAMBDA LIGHT CHAINS
Kappa free light chain: 48.7 mg/L — ABNORMAL HIGH (ref 3.3–19.4)
Kappa, lambda light chain ratio: 2.12 — ABNORMAL HIGH (ref 0.26–1.65)
Lambda free light chains: 23 mg/L (ref 5.7–26.3)

## 2019-12-01 LAB — IGG, IGA, IGM
IgA: 331 mg/dL (ref 64–422)
IgG (Immunoglobin G), Serum: 1173 mg/dL (ref 586–1602)
IgM (Immunoglobulin M), Srm: 136 mg/dL (ref 26–217)

## 2019-12-04 ENCOUNTER — Other Ambulatory Visit: Payer: Self-pay

## 2019-12-07 ENCOUNTER — Other Ambulatory Visit: Payer: Self-pay

## 2019-12-07 ENCOUNTER — Inpatient Hospital Stay (HOSPITAL_COMMUNITY): Payer: Medicare Other | Attending: Internal Medicine | Admitting: Hematology

## 2019-12-07 DIAGNOSIS — Z79899 Other long term (current) drug therapy: Secondary | ICD-10-CM | POA: Diagnosis not present

## 2019-12-07 DIAGNOSIS — M2550 Pain in unspecified joint: Secondary | ICD-10-CM | POA: Insufficient documentation

## 2019-12-07 DIAGNOSIS — I10 Essential (primary) hypertension: Secondary | ICD-10-CM | POA: Insufficient documentation

## 2019-12-07 DIAGNOSIS — J45909 Unspecified asthma, uncomplicated: Secondary | ICD-10-CM | POA: Diagnosis not present

## 2019-12-07 DIAGNOSIS — Z803 Family history of malignant neoplasm of breast: Secondary | ICD-10-CM | POA: Diagnosis not present

## 2019-12-07 DIAGNOSIS — D472 Monoclonal gammopathy: Secondary | ICD-10-CM | POA: Diagnosis not present

## 2019-12-07 DIAGNOSIS — Z7982 Long term (current) use of aspirin: Secondary | ICD-10-CM | POA: Insufficient documentation

## 2019-12-07 DIAGNOSIS — K219 Gastro-esophageal reflux disease without esophagitis: Secondary | ICD-10-CM | POA: Insufficient documentation

## 2019-12-07 DIAGNOSIS — E785 Hyperlipidemia, unspecified: Secondary | ICD-10-CM | POA: Insufficient documentation

## 2019-12-07 DIAGNOSIS — Z8249 Family history of ischemic heart disease and other diseases of the circulatory system: Secondary | ICD-10-CM | POA: Diagnosis not present

## 2019-12-07 DIAGNOSIS — Z791 Long term (current) use of non-steroidal anti-inflammatories (NSAID): Secondary | ICD-10-CM | POA: Insufficient documentation

## 2019-12-07 NOTE — Progress Notes (Signed)
[place] [address], [place] [address]   CLINIC:  Medical Oncology/Hematology  PCP:  Celene Squibb, MD [place] [address] [phone]   REASON FOR VISIT:  Follow-up for MGUS   CURRENT THERAPY: Clinical surveillance    INTERVAL HISTORY:  Christine Leblanc 85 y.o. female presents today for follow-up.  She reports overall doing well.  She denies any significant fatigue.  Denies any new bone pain.  She continues follow-up for monoclonal gammopathy of undetermined significance.  She denies any change in bowel habits.  Appetite is stable.  No weight loss.  Denies any fevers, chills, night sweats.  She is here to discuss most recent labs and office visit.   REVIEW OF SYSTEMS:  Review of Systems  Constitutional: Negative.   Musculoskeletal: Positive for arthralgias.  All other systems reviewed and are negative.    PAST MEDICAL/SURGICAL HISTORY:  Past Medical History:  Diagnosis Date  . Anemia   . Asthma   . Cancer ([place])    skin Ca- ? basal cell   . Chronic back pain   . Chronic neck pain   . Degenerative joint disease    Left shoulder; cervical spine, knees & hands   . Gastroesophageal reflux disease    Hiatal hernia; distal esophageal web requiring dilatation; gastric polyps; gastritis; refuses colonoscopy  . GERD (gastroesophageal reflux disease)   . History of stress test 1990's   stress test done under the care of Dr. Lattie Haw & Dr. Gwenlyn Found, now being followed by Dr. Harl Bowie- in [place] , recently seen & told to f/U in one yr.   . Hyperlipidemia 04/27/2011  . Hypertension   . Hypothyroidism   . Lymphocytic thyroiditis   . Multiple thyroid nodules 2010   Adenomatous; thyroidectomy in 2010  . Osteoarthritis of right knee 07/27/2014  . Pneumonia    hosp. for pneumonia- long time ago   . Primary localized osteoarthrosis of right shoulder 06/21/2015  . Seizures ([place])    yes- as a child- & into adult years, states she took med.  for them at one time, stopped at 32 yrs. of age   . Syncope and collapse 2007   Possible CVA in 2012 with left lower extremity weakness; refused hospitalization; CT-Atrophy and chronic microvascular ischemic change.    Past Surgical History:  Procedure Laterality Date  . ABDOMINAL HYSTERECTOMY     fibroids  . CATARACT EXTRACTION     Bilateral; redo surgery on the right for incomplete primary procedure  . COLONOSCOPY  Remote  . ESOPHAGOGASTRODUODENOSCOPY  12/10   Dr. 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 ESOPHAGOGASTRODUODENOSCOPY N/A 03/21/2015   Procedure: ESOPHAGOGASTRODUODENOSCOPY (EGD);  Surgeon: Danie Binder, MD;  [facility];  Service: Endoscopy;  Laterality: N/A;  1130   . EYE SURGERY    . PROLAPSED UTERINE FIBROID LIGATION     outcomed with rectocele & cystocele  . SAVORY DILATION N/A 03/21/2015   Procedure: SAVORY DILATION;  Surgeon: Danie Binder, MD;  [facility];  Service: Endoscopy;  Laterality: N/A;  . SHOULDER SURGERY     left  . TOTAL KNEE ARTHROPLASTY Right 07/27/2014   Procedure: RIGHT TOTAL KNEE ARTHROPLASTY;  Surgeon: Johnny Bridge, MD;  Location: [facility];  Service: Orthopedics;  Laterality: Right;  . TOTAL SHOULDER ARTHROPLASTY Right 06/21/2015   Procedure: RIGHT TOTAL SHOULDER ARTHROPLASTY;  Surgeon: Marchia Bond, MD;  Location: [facility];  Service: Orthopedics;  Laterality: Right;  . TOTAL THYROIDECTOMY  2010     SOCIAL HISTORY:  Social History   Socioeconomic History  . Marital status: Widowed    Spouse name: Not on file  . Number of children: 4  . Years of education: 65  . Highest education level: Not on file  Occupational History  . 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 Occupation: Retired    Comment: Marine scientist  Social Needs  . Financial resource strain: Not on file  . Food insecurity    Worry: Not on file    Inability: Not on file  .  Transportation needs    Medical: Not on file    Non-medical: Not on file  Tobacco Use  . Smoking status: Former Smoker    Packs/day: 0.80    Years: 20.00    Pack years: 16.00    Types: Cigarettes    Quit date: 12/31/1978    Years since quitting: 40.9  . Smokeless tobacco: Never Used  Substance and Sexual Activity  . Alcohol use: No  . Drug use: No  . Sexual activity: Never    Birth control/protection: Surgical    Comment: widowed since 2010  Lifestyle  . Physical activity    Days per week: Not on file    Minutes per session: Not on file  . Stress: Not on file  Relationships  . Social Herbalist on phone: Not on file    Gets together: Not on file    Attends religious service: Not on file    Active member of club or organization: Not on file    Attends meetings of clubs or organizations: Not on file    Relationship status: Not on file  . Intimate partner violence    Fear of current or ex partner: Not on file    Emotionally abused: Not on file    Physically abused: Not on file    Forced sexual activity: Not on file  Other Topics Concern  . Not on file  Social History Narrative   Lives at home. Her daughter lives with her.    Caffeine use: 1 cup coffee per day    FAMILY HISTORY:  Family History  Problem Relation Age of Onset  . Heart disease Mother   . Gallbladder disease Mother   . Heart failure Mother   . Diabetes Mother   . Breast cancer Sister        30 years ago  . Diabetes Sister   . Heart attack Father   . Diabetes Maternal Grandmother   . Colon cancer Neg Hx   . Stroke Neg Hx     CURRENT MEDICATIONS:  Outpatient Encounter Medications as of 12/07/2019  Medication Sig  . aspirin 81 MG tablet Take 81 mg by mouth daily.  Marland Kitchen CALCIUM-VITAMIN D PO Take by mouth. Calcium 600 mg and Vitamin D 1000 unit. One tablet daily  . EPINEPHrine 0.3 mg/0.3 mL IJ SOAJ injection Inject into the muscle as needed for anaphylaxis.  . ferrous sulfate 325 (65 FE) MG  tablet Take 325 mg by mouth daily with breakfast.  . levocetirizine (XYZAL) 5 MG tablet Take 5 mg by mouth every evening.    Marland Kitchen levothyroxine (SYNTHROID) 75 MCG tablet TAKE 1 TABLET DAILY  . montelukast (SINGULAIR) 10 MG tablet Take 10 mg by mouth at bedtime.  Marland Kitchen omeprazole (PRILOSEC) 40 MG capsule 1 PO 30 MINS PRIOR TO BREAKFAST  .  pravastatin (PRAVACHOL) 80 MG tablet TAKE 1 TABLET EVERY EVENING (NEEDS APPOINTMENT FOR REFILLS, CALL [phone])  . senna (SENOKOT) 8.6 MG tablet Take 2 tablets by mouth at bedtime.   . vitamin B-12 (CYANOCOBALAMIN) 1000 MCG tablet Take 1,000 mcg by mouth every morning.  . vitamin C (ASCORBIC ACID) 500 MG tablet Take 500 mg by mouth daily.  . vitamin E 400 UNIT capsule Take 400 Units by mouth at bedtime.   Marland Kitchen albuterol (PROVENTIL HFA) 108 (90 BASE) MCG/ACT inhaler Inhale 1-2 puffs into the lungs every 6 (six) hours as needed for wheezing or shortness of breath. Wheezing, Asthma Symptoms  . benzonatate (TESSALON) 200 MG capsule Take 200 mg by mouth 2 (two) times daily as needed.   . diazepam (VALIUM) 5 MG tablet Take one tablet by mouth at bedtime as needed for sleep (Patient not taking: Reported on 12/07/2019)  . ipratropium (ATROVENT) 0.03 % nasal spray Place 2 sprays into both nostrils daily as needed (allergies stuffy nose).   . [DISCONTINUED] meloxicam (MOBIC) 15 MG tablet   . [DISCONTINUED] omeprazole (PRILOSEC) 20 MG capsule Take 20 mg by mouth daily.    No facility-administered encounter medications on file as of 12/07/2019.     ALLERGIES:  Allergies  Allergen Reactions  . Celecoxib Shortness Of Breath  . Dexilant [Dexlansoprazole] Anaphylaxis    abd pain  . Famotidine     Makes her feel "very bad"     PHYSICAL EXAM:  ECOG Performance status: 1  Vitals:   12/07/19 1531  BP: 136/75  Pulse: 87  Resp: 18  Temp: (!) 96.8 F (36 C)  SpO2: 98%   Filed Weights   12/07/19 1531  Weight: 115 lb 6.4 oz (52.3 kg)    Physical Exam Constitutional:       Appearance: Normal appearance.  HENT:     Head: Normocephalic.     Right Ear: External ear normal.     Left Ear: External ear normal.  Eyes:     Conjunctiva/sclera: Conjunctivae normal.  Neck:     Musculoskeletal: Normal range of motion.  Cardiovascular:     Rate and Rhythm: Normal rate and regular rhythm.     Pulses: Normal pulses.     Heart sounds: Normal heart sounds.  Pulmonary:     Effort: Pulmonary effort is normal.     Breath sounds: Normal breath sounds.  Abdominal:     General: Bowel sounds are normal.  Musculoskeletal:     Comments: Decreased range of motion  Skin:    General: Skin is warm.  Neurological:     General: No focal deficit present.     Mental Status: She is alert and oriented to person, place, and time.  Psychiatric:        Mood and Affect: Mood normal.        Behavior: Behavior normal.      LABORATORY DATA:  I have reviewed the labs as listed.  CBC    Component Value Date/Time   WBC 7.2 11/30/2019 1420   RBC 3.81 (L) 11/30/2019 1420   HGB 11.5 (L) 11/30/2019 1420   HGB 11.2 05/01/2016 1604   HCT 36.2 11/30/2019 1420   HCT 33.8 (L) 05/01/2016 1604   PLT 195 11/30/2019 1420   PLT 181 05/01/2016 1604   MCV 95.0 11/30/2019 1420   MCV 89 05/01/2016 1604   MCH 30.2 11/30/2019 1420   MCHC 31.8 11/30/2019 1420   RDW 13.2 11/30/2019 1420   RDW 14.2 05/01/2016 1604  LYMPHSABS 1.5 11/30/2019 1420   LYMPHSABS 1.6 05/01/2016 1604   MONOABS 0.7 11/30/2019 1420   EOSABS 0.7 (H) 11/30/2019 1420   EOSABS 0.7 (H) 05/01/2016 1604   BASOSABS 0.1 11/30/2019 1420   BASOSABS 0.0 05/01/2016 1604   CMP Latest Ref Rng & Units 11/30/2019 12/02/2018 11/07/2018  Glucose 70 - 99 mg/dL 102(H) 110(H) 90  BUN 8 - 23 mg/dL 19 24(H) 19  Creatinine 0.44 - 1.00 mg/dL 1.21(H) 1.14(H) 0.90  Sodium 135 - 145 mmol/L 140 142 138  Potassium 3.5 - 5.1 mmol/L 3.7 3.4(L) 3.6  Chloride 98 - 111 mmol/L 106 108 103  CO2 22 - 32 mmol/L '27 25 26  ' Calcium 8.9 - 10.3 mg/dL  8.8(L) 9.4 9.2  Total Protein 6.5 - 8.1 g/dL 6.7 7.4 7.5  Total Bilirubin 0.3 - 1.2 mg/dL 0.3 0.3 0.3  Alkaline Phos 38 - 126 U/L 54 62 70  AST 15 - 41 U/L '25 25 23  ' ALT 0 - 44 U/L '17 15 13      ' ASSESSMENT & PLAN:   MGUS (monoclonal gammopathy of unknown significance) 1.  Monoclonal myopathy of undetermined significance -Patiently originally diagnosed in 2018.  Neurology checked a myeloma panel which demonstrated IgG monoclonal protein with kappa light chain specificity on immunofixation.  SPEP 0.2 g/dL.  Patient had normocytic anemia at that time. -Establish care with hematology in 2018.  Repeat SPEP demonstrated 0.3 g/dL M spike. -November 2019 M spike was not observed -Recent labs from November 30, 2019 did not reveal any M spike, IFE was negative.  Kappa light chain elevated at 48.7, lambda light chains 23, ratio 2.12.  Immunoglobulins were within normal.  Hemoglobin 11.5 with normal MCV at 95.  Serum creatinine elevated at 1.21, GFR 40. -Patient has never had complete work-up for multiple myeloma including skeletal survey or, 24-hour urine, beta-2 microglobulin, or bone marrow biopsy. -We discussed with patient to continue further work-up.  Patient states she does not like this is necessary at this time.  She will opt for careful observation.         [place] [id number]

## 2019-12-07 NOTE — Assessment & Plan Note (Signed)
1.  Monoclonal myopathy of undetermined significance -Patiently originally diagnosed in 2018.  Neurology checked a myeloma panel which demonstrated IgG monoclonal protein with kappa light chain specificity on immunofixation.  SPEP 0.2 g/dL.  Patient had normocytic anemia at that time. -Establish care with hematology in 2018.  Repeat SPEP demonstrated 0.3 g/dL M spike. -November 2019 M spike was not observed -Recent labs from November 30, 2019 did not reveal any M spike, IFE was negative.  Kappa light chain elevated at 48.7, lambda light chains 23, ratio 2.12.  Immunoglobulins were within normal.  Hemoglobin 11.5 with normal MCV at 95.  Serum creatinine elevated at 1.21, GFR 40. -Patient has never had complete work-up for multiple myeloma including skeletal survey or, 24-hour urine, beta-2 microglobulin, or bone marrow biopsy. -We discussed with patient to continue further work-up.  Patient states she does not like this is necessary at this time.  She will opt for careful observation.

## 2019-12-08 ENCOUNTER — Encounter: Payer: Medicare Other | Admitting: Endocrinology

## 2019-12-08 NOTE — Progress Notes (Signed)
This encounter was created in error - please disregard.

## 2019-12-11 DIAGNOSIS — I10 Essential (primary) hypertension: Secondary | ICD-10-CM | POA: Diagnosis not present

## 2019-12-11 DIAGNOSIS — E785 Hyperlipidemia, unspecified: Secondary | ICD-10-CM | POA: Diagnosis not present

## 2019-12-11 DIAGNOSIS — E039 Hypothyroidism, unspecified: Secondary | ICD-10-CM | POA: Diagnosis not present

## 2019-12-11 DIAGNOSIS — E782 Mixed hyperlipidemia: Secondary | ICD-10-CM | POA: Diagnosis not present

## 2019-12-11 DIAGNOSIS — D519 Vitamin B12 deficiency anemia, unspecified: Secondary | ICD-10-CM | POA: Diagnosis not present

## 2019-12-11 DIAGNOSIS — D631 Anemia in chronic kidney disease: Secondary | ICD-10-CM | POA: Diagnosis not present

## 2019-12-11 LAB — IRON,TIBC AND FERRITIN PANEL
Ferritin: 349
Iron: 90
UIBC: 122

## 2019-12-11 LAB — LIPID PANEL
Cholesterol: 150 (ref 0–200)
HDL: 60 (ref 35–70)
LDL Cholesterol: 77
Triglycerides: 66 (ref 40–160)

## 2019-12-11 LAB — COMPREHENSIVE METABOLIC PANEL
Albumin: 4 (ref 3.5–5.0)
Calcium: 9.2 (ref 8.7–10.7)
GFR calc Af Amer: 54
GFR calc non Af Amer: 47
Globulin: 2.7

## 2019-12-11 LAB — BASIC METABOLIC PANEL
BUN: 21 (ref 4–21)
Chloride: 105 (ref 99–108)
Creatinine: 1.1 (ref <=1.1)
Glucose: 91
Potassium: 4.2 (ref 3.4–5.3)
Sodium: 144 (ref 137–147)

## 2019-12-11 LAB — HEPATIC FUNCTION PANEL
ALT: 13 (ref 7–35)
AST: 25 (ref 13–35)
Alkaline Phosphatase: 68 (ref 25–125)
Bilirubin, Total: 0.5

## 2019-12-11 LAB — VITAMIN B12: Vitamin B-12: 1383

## 2019-12-11 LAB — TSH: TSH: 4.66 (ref <=5.90)

## 2019-12-17 DIAGNOSIS — J302 Other seasonal allergic rhinitis: Secondary | ICD-10-CM | POA: Diagnosis not present

## 2019-12-17 DIAGNOSIS — E782 Mixed hyperlipidemia: Secondary | ICD-10-CM | POA: Diagnosis not present

## 2019-12-17 DIAGNOSIS — M8580 Other specified disorders of bone density and structure, unspecified site: Secondary | ICD-10-CM | POA: Diagnosis not present

## 2019-12-17 DIAGNOSIS — D638 Anemia in other chronic diseases classified elsewhere: Secondary | ICD-10-CM | POA: Diagnosis not present

## 2019-12-17 DIAGNOSIS — E539 Vitamin B deficiency, unspecified: Secondary | ICD-10-CM | POA: Diagnosis not present

## 2019-12-17 DIAGNOSIS — Z516 Encounter for desensitization to allergens: Secondary | ICD-10-CM | POA: Diagnosis not present

## 2019-12-17 DIAGNOSIS — F411 Generalized anxiety disorder: Secondary | ICD-10-CM | POA: Diagnosis not present

## 2019-12-17 DIAGNOSIS — I10 Essential (primary) hypertension: Secondary | ICD-10-CM | POA: Diagnosis not present

## 2019-12-17 DIAGNOSIS — K219 Gastro-esophageal reflux disease without esophagitis: Secondary | ICD-10-CM | POA: Diagnosis not present

## 2019-12-17 DIAGNOSIS — E039 Hypothyroidism, unspecified: Secondary | ICD-10-CM | POA: Diagnosis not present

## 2019-12-23 ENCOUNTER — Ambulatory Visit (INDEPENDENT_AMBULATORY_CARE_PROVIDER_SITE_OTHER): Payer: Medicare Other | Admitting: Endocrinology

## 2019-12-23 ENCOUNTER — Other Ambulatory Visit: Payer: Self-pay

## 2019-12-23 ENCOUNTER — Encounter: Payer: Self-pay | Admitting: Endocrinology

## 2019-12-23 VITALS — BP 130/72 | HR 80 | Ht 62.0 in | Wt 117.6 lb

## 2019-12-23 DIAGNOSIS — E890 Postprocedural hypothyroidism: Secondary | ICD-10-CM

## 2019-12-23 MED ORDER — LEVOTHYROXINE SODIUM 75 MCG PO TABS: 75.0000 ug | ORAL_TABLET | Freq: Every day | ORAL | 1 refills | Status: DC

## 2019-12-23 NOTE — Patient Instructions (Signed)
Take Calcium at lunch  Thyroid extra 1/2 pill on Sundays

## 2019-12-23 NOTE — Progress Notes (Signed)
Christine Leblanc 85 y.o.    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  She has been followed every 6-12 months for her thyroid supplementation and monitoring of her levels However has not been seen in follow-up since 9/18  She previously had been on 100 mcg since 03/2014  This was subsequently reduced down to 75 g once daily and the dose has been continued unchanged  She has had more fatigue in the last few weeks but also has had some anemia May be starting to feel a little better recently Her weight is about the same  No symptoms of cold intolerance recently  She has not missed any doses As before she takes Prilosec daily  She does take iron now and is taking this 2 hours after Synthroid She takes her calcium after breakfast and her Synthroid a few minutes before breakfast  TSH done by PCP in 05/2019 was 3.8, and is now 4.7  Wt Readings from Last 3 Encounters:  12/23/19 117 lb 9.6 oz (53.3 kg)  12/07/19 115 lb 6.4 oz (52.3 kg)  07/22/19 115 lb (52.2 kg)   Lab Results  Component Value Date   TSH 4.66 12/11/2019   TSH 2.19 09/11/2018   TSH 1.150 09/12/2017   FREET4 1.22 09/11/2018   FREET4 1.78 (H) 09/12/2017   FREET4 1.75 (H) 05/01/2017       Allergies as of 12/23/2019      Reactions   Celecoxib Shortness Of Breath   Dexilant [dexlansoprazole] Anaphylaxis   abd pain   Famotidine    Makes her feel "very bad"      Medication List       Accurate as of December 23, 2019  9:04 PM. If you have any questions, ask your nurse or doctor.        aspirin 81 MG tablet Take 81 mg by mouth daily.   benzonatate 200 MG capsule Commonly known as: TESSALON Take 200 mg by mouth 2 (two) times daily as needed.   CALCIUM-VITAMIN D  PO Take by mouth. Calcium 600 mg and Vitamin D 1000 unit. One tablet daily   diazepam 5 MG tablet Commonly known as: VALIUM Take one tablet by mouth at bedtime as needed for sleep   EPINEPHrine 0.3 mg/0.3 mL Soaj injection Commonly known as: EPI-PEN Inject into the muscle as needed for anaphylaxis.   ferrous sulfate 325 (65 FE) MG tablet Take 325 mg by mouth daily with breakfast.   ipratropium 0.03 % nasal spray Commonly known as: ATROVENT Place 2 sprays into both nostrils daily as needed (allergies stuffy nose).   levocetirizine 5 MG tablet Commonly known as: XYZAL Take 5 mg by mouth every evening.   levothyroxine 75 MCG tablet Commonly known as: SYNTHROID Take 1 tablet (75 mcg total) by mouth daily. Extra 1/2 tab on Sundays What changed: additional instructions Changed by: Elayne Snare, MD   montelukast 10 MG tablet Commonly known as: SINGULAIR Take 10 mg by mouth at bedtime.   omeprazole 40 MG capsule Commonly known as: PRILOSEC 1 PO 30 MINS PRIOR TO BREAKFAST   pravastatin 80 MG tablet Commonly known as: PRAVACHOL TAKE 1 TABLET EVERY EVENING (NEEDS APPOINTMENT FOR REFILLS, CALL [phone])   Proventil HFA 108 (90 Base) MCG/ACT inhaler Generic  drug: albuterol Inhale 1-2 puffs into the lungs every 6 (six) hours as needed for wheezing or shortness of breath. Wheezing, Asthma Symptoms   senna 8.6 MG tablet Commonly known as: SENOKOT Take 2 tablets by mouth at bedtime.   vitamin B-12 1000 MCG tablet Commonly known as: CYANOCOBALAMIN Take 1,000 mcg by mouth every morning.   vitamin C 500 MG tablet Commonly known as: ASCORBIC ACID Take 500 mg by mouth daily.   vitamin E 400 UNIT capsule Take 400 Units by mouth at bedtime.       Allergies:  Allergies  Allergen Reactions  . Celecoxib Shortness Of Breath  . Dexilant [Dexlansoprazole] Anaphylaxis    abd pain  . Famotidine     Makes her feel "very bad"    Past Medical History:  Diagnosis Date  .  Anemia   . Asthma   . Cancer ([place])    skin Ca- ? basal cell   . Chronic back pain   . Chronic neck pain   . Degenerative joint disease    Left shoulder; cervical spine, knees & hands   . Gastroesophageal reflux disease    Hiatal hernia; distal esophageal web requiring dilatation; gastric polyps; gastritis; refuses colonoscopy  . GERD (gastroesophageal reflux disease)   . History of stress test 1990's   stress test done under the care of Dr. Lattie Haw & Dr. Gwenlyn Found, now being followed by Dr. Harl Bowie- in [place] , recently seen & told to f/U in one yr.   . Hyperlipidemia 04/27/2011  . Hypertension   . Hypothyroidism   . Lymphocytic thyroiditis   . Multiple thyroid nodules 2010   Adenomatous; thyroidectomy in 2010  . Osteoarthritis of right knee 07/27/2014  . Pneumonia    hosp. for pneumonia- long time ago   . Primary localized osteoarthrosis of right shoulder 06/21/2015  . Seizures ([place])    yes- as a child- & into adult years, states she took med. for them at one time, stopped at 32 yrs. of age   . Syncope and collapse 2007   Possible CVA in 2012 with left lower extremity weakness; refused hospitalization; CT-Atrophy and chronic microvascular ischemic change.     Past Surgical History:  Procedure Laterality Date  . ABDOMINAL HYSTERECTOMY     fibroids  . CATARACT EXTRACTION     Bilateral; redo surgery on the right for incomplete primary procedure  . COLONOSCOPY  Remote  . ESOPHAGOGASTRODUODENOSCOPY  12/10   Dr. 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 ESOPHAGOGASTRODUODENOSCOPY N/A 03/21/2015   Procedure: ESOPHAGOGASTRODUODENOSCOPY (EGD);  Surgeon: Danie Binder, MD;  [facility];  Service: Endoscopy;  Laterality: N/A;  1130   . EYE SURGERY    . PROLAPSED UTERINE FIBROID LIGATION     outcomed with rectocele & cystocele  . SAVORY DILATION N/A 03/21/2015   Procedure: SAVORY DILATION;  Surgeon: Danie Binder, MD;   [facility];  Service: Endoscopy;  Laterality: N/A;  . SHOULDER SURGERY     left  . TOTAL KNEE ARTHROPLASTY Right 07/27/2014   Procedure: RIGHT TOTAL KNEE ARTHROPLASTY;  Surgeon: Johnny Bridge, MD;  Location: [facility];  Service: Orthopedics;  Laterality: Right;  . TOTAL SHOULDER ARTHROPLASTY Right 06/21/2015   Procedure: RIGHT TOTAL SHOULDER ARTHROPLASTY;  Surgeon: Marchia Bond, MD;  Location: [facility];  Service: Orthopedics;  Laterality: Right;  . TOTAL THYROIDECTOMY  2010    Family History  Problem Relation Age of Onset  . Heart  disease Mother   . Gallbladder disease Mother   . Heart failure Mother   . Diabetes Mother   . Breast cancer Sister        30 years ago  . Diabetes Sister   . Heart attack Father   . Diabetes Maternal Grandmother   . Colon cancer Neg Hx   . Stroke Neg Hx     Social History:  reports that she quit smoking about 41 years ago. Her smoking use included cigarettes. She has a 16.00 pack-year smoking history. She has never used smokeless tobacco. She reports that she does not drink alcohol or use drugs.  REVIEW Of SYSTEMS:  ROS  Weight History: Her weight today at home is 115  Wt Readings from Last 3 Encounters:  12/23/19 117 lb 9.6 oz (53.3 kg)  12/07/19 115 lb 6.4 oz (52.3 kg)  07/22/19 115 lb (52.2 kg)    History of hypercholesterolemia followed by PCP   She has chronic difficulty swallowing   Examination:   BP 130/72 (BP Location: Left Arm, Patient Position: Sitting, Cuff Size: Normal)   Pulse 80   Ht 5\' 2"  (1.575 m)   Wt 117 lb 9.6 oz (53.3 kg)   SpO2 98%   BMI 21.51 kg/m   Anterior neck exam normal with no masses palpable and no lymphadenopathy     Assessments   Hypothyroidism, postsurgical and long-standing  She has been taking a stable dose of 75 mcg levothyroxine for some time She takes this regularly in the morning but as before taking Prilosec at the same time She does complain of fatigue but this likely is  multifactorial Currently TSH is 4.7 which may be appropriate for her age also; may however try a slight increase in her dosage  She will continue 75 mcg of levothyroxine but add another half tablet on Sundays Although she takes her calcium after eating breakfast and her levothyroxine before breakfast advised her to move her calcium to after lunch or dinner  Follow-up in 4 months     Patient Instructions  Take Calcium at lunch  Thyroid extra 1/2 pill on Sundays    Leshae Mcclay 12/23/2019, 9:04 PM

## 2020-01-13 ENCOUNTER — Ambulatory Visit: Payer: Medicare Other | Admitting: Cardiology

## 2020-01-14 DIAGNOSIS — J302 Other seasonal allergic rhinitis: Secondary | ICD-10-CM | POA: Diagnosis not present

## 2020-01-14 DIAGNOSIS — Z516 Encounter for desensitization to allergens: Secondary | ICD-10-CM | POA: Diagnosis not present

## 2020-01-18 ENCOUNTER — Telehealth: Payer: Self-pay

## 2020-01-18 ENCOUNTER — Encounter: Payer: Self-pay | Admitting: Cardiology

## 2020-01-18 ENCOUNTER — Telehealth (INDEPENDENT_AMBULATORY_CARE_PROVIDER_SITE_OTHER): Payer: Medicare Other | Admitting: Cardiology

## 2020-01-18 VITALS — Ht 62.0 in | Wt 115.0 lb

## 2020-01-18 DIAGNOSIS — E782 Mixed hyperlipidemia: Secondary | ICD-10-CM

## 2020-01-18 DIAGNOSIS — I10 Essential (primary) hypertension: Secondary | ICD-10-CM

## 2020-01-18 DIAGNOSIS — R55 Syncope and collapse: Secondary | ICD-10-CM | POA: Diagnosis not present

## 2020-01-18 DIAGNOSIS — Z8673 Personal history of transient ischemic attack (TIA), and cerebral infarction without residual deficits: Secondary | ICD-10-CM

## 2020-01-18 NOTE — Patient Instructions (Signed)

## 2020-01-18 NOTE — Progress Notes (Signed)
Virtual Visit via Telephone Note   This visit type was conducted due to national recommendations for restrictions regarding the COVID-19 Pandemic (e.g. social distancing) in an effort to limit this patient's exposure and mitigate transmission in our community.  Due to her co-morbid illnesses, this patient is at least at moderate risk for complications without adequate follow up.  This format is felt to be most appropriate for this patient at this time.  The patient did not have access to video technology/had technical difficulties with video requiring transitioning to audio format only (telephone).  All issues noted in this document were discussed and addressed.  No physical exam could be performed with this format.  Please refer to the patient's chart for her  consent to telehealth for [facility].   Date:  01/18/2020   ID:  Christine Leblanc, DOB 1929/12/08, MRN [id number]  Patient Location: Home Provider Location: Office  PCP:  Celene Squibb, MD  Cardiologist:  Carlyle Dolly, MD  Electrophysiologist:  None   Evaluation Performed:  Follow-Up Visit  Chief Complaint:  Follow up visit  History of Present Illness:    Christine Leblanc is a 86 y.o. female seen today for follow up of the following medical problems.   1. Near syncope - seen 12/26/18 by [facility] for predominately positional dizzinesswith falls x 2 in October - Jan 2020 monitor showed rare ectopy, no significant arrhythmias    - no recent symptoms   2. HTN - 12/2019 appts 130s/70s - compliant with meds    3. Hyperlipidemia - 12/2019 TC 150 TG 66 HDL 60 LDL 77  - compliant with statin  4. Hx of TIA - on ASA and statin for secondary prevention - denies any recent symptoms  The patient does not have symptoms concerning for COVID-19 infection (fever, chills, cough, or new shortness of breath).    Past Medical History:  Diagnosis Date  . Anemia   . Asthma   . Cancer ([place])    skin Ca- ? basal cell     . Chronic back pain   . Chronic neck pain   . Degenerative joint disease    Left shoulder; cervical spine, knees & hands   . Gastroesophageal reflux disease    Hiatal hernia; distal esophageal web requiring dilatation; gastric polyps; gastritis; refuses colonoscopy  . GERD (gastroesophageal reflux disease)   . History of stress test 1990's   stress test done under the care of Dr. Lattie Haw & Dr. Gwenlyn Found, now being followed by Dr. Harl Bowie- in [place] , recently seen & told to f/U in one yr.   . Hyperlipidemia 04/27/2011  . Hypertension   . Hypothyroidism   . Lymphocytic thyroiditis   . Multiple thyroid nodules 2010   Adenomatous; thyroidectomy in 2010  . Osteoarthritis of right knee 07/27/2014  . Pneumonia    hosp. for pneumonia- long time ago   . Primary localized osteoarthrosis of right shoulder 06/21/2015  . Seizures ([place])    yes- as a child- & into adult years, states she took med. for them at one time, stopped at 32 yrs. of age   . Syncope and collapse 2007   Possible CVA in 2012 with left lower extremity weakness; refused hospitalization; CT-Atrophy and chronic microvascular ischemic change.    Past Surgical History:  Procedure Laterality Date  . ABDOMINAL HYSTERECTOMY     fibroids  . CATARACT EXTRACTION     Bilateral; redo surgery on the right for incomplete primary procedure  . COLONOSCOPY  Remote  . ESOPHAGOGASTRODUODENOSCOPY  12/10   Dr. 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 ESOPHAGOGASTRODUODENOSCOPY N/A 03/21/2015   Procedure: ESOPHAGOGASTRODUODENOSCOPY (EGD);  Surgeon: Danie Binder, MD;  [facility];  Service: Endoscopy;  Laterality: N/A;  1130   . EYE SURGERY    . PROLAPSED UTERINE FIBROID LIGATION     outcomed with rectocele & cystocele  . SAVORY DILATION N/A 03/21/2015   Procedure: SAVORY DILATION;  Surgeon: Danie Binder, MD;  [facility];  Service: Endoscopy;  Laterality: N/A;  .  SHOULDER SURGERY     left  . TOTAL KNEE ARTHROPLASTY Right 07/27/2014   Procedure: RIGHT TOTAL KNEE ARTHROPLASTY;  Surgeon: Johnny Bridge, MD;  Location: [place];  Service: Orthopedics;  Laterality: Right;  . TOTAL SHOULDER ARTHROPLASTY Right 06/21/2015   Procedure: RIGHT TOTAL SHOULDER ARTHROPLASTY;  Surgeon: Marchia Bond, MD;  Location: [place];  Service: Orthopedics;  Laterality: Right;  . TOTAL THYROIDECTOMY  2010     No outpatient medications have been marked as taking for the 01/18/20 encounter (Appointment) with Arnoldo Lenis, MD.     Allergies:   Celecoxib, Dexilant [dexlansoprazole], and Famotidine   Social History   Tobacco Use  . Smoking status: Former Smoker    Packs/day: 0.80    Years: 20.00    Pack years: 16.00    Types: Cigarettes    Quit date: 12/31/1978    Years since quitting: 41.0  . Smokeless tobacco: Never Used  Substance Use Topics  . Alcohol use: No  . Drug use: No     Family Hx: The patient's family history includes Breast cancer in her sister; Diabetes in her maternal grandmother, mother, and sister; Gallbladder disease in her mother; Heart attack in her father; Heart disease in her mother; Heart failure in her mother. There is no history of Colon cancer or Stroke.  ROS:   Please see the history of present illness.     All other systems reviewed and are negative.   Prior CV studies:   The following studies were reviewed today:   Labs/Other Tests and Data Reviewed:    EKG:  No ECG reviewed.  Recent Labs: 11/30/2019: Hemoglobin 11.5; Platelets 195 12/11/2019: ALT 13; BUN 21; Creatinine 1.1; Potassium 4.2; Sodium 144; TSH 4.66   Recent Lipid Panel Lab Results  Component Value Date/Time   CHOL 150 12/11/2019 12:00 AM   TRIG 66 12/11/2019 12:00 AM   HDL 60 12/11/2019 12:00 AM   CHOLHDL 2.5 09/20/2016 09:51 AM   LDLCALC 77 12/11/2019 12:00 AM    Wt Readings from Last 3 Encounters:  12/23/19 117 lb 9.6 oz (53.3 kg)  12/07/19 115 lb 6.4  oz (52.3 kg)  07/22/19 115 lb (52.2 kg)     Objective:    Vital Signs:   Today's Vitals   01/18/20 1246  Weight: 115 lb (52.2 kg)  Height: 5\' 2"  (1.575 m)   Body mass index is 21.03 kg/m. Normal affect. Normal speech pattern and tone. Comfortable, no apaprent distress. No audible signs of SOB or wheezing.   ASSESSMENT & PLAN:    1.Near syncope - prior symptoms would suggest orthostasis - no recent issues, contineu to monitor.   2. HTN - would accept higher bp's for her in general due to prior issues with orthostasis =- recent bp's are fine, continue to moniotor  3. Hyperlipidemia -at goal, continue current meds   4. History of TIA - secondary  prevention with ASA and statin, no ACE-I due to prior issues with orthostasis - no recent issues, continue current meds  COVID-19 Education: The signs and symptoms of COVID-19 were discussed with the patient and how to seek care for testing (follow up with PCP or arrange E-visit).  The importance of social distancing was discussed today.  Time:   Today, I have spent 17 minutes with the patient with telehealth technology discussing the above problems.     Medication Adjustments/Labs and Tests Ordered: Current medicines are reviewed at length with the patient today.  Concerns regarding medicines are outlined above.   Tests Ordered: No orders of the defined types were placed in this encounter.   Medication Changes: No orders of the defined types were placed in this encounter.   Follow Up:  Either In Person or Virtual in 6 month(s)  Signed, Carlyle Dolly, MD  01/18/2020 12:49 PM    [place]

## 2020-01-18 NOTE — Telephone Encounter (Signed)
Virtual Visit Pre-Appointment Phone Call  "(Name), I am calling you today to discuss your upcoming appointment. We are currently trying to limit exposure to the virus that causes COVID-19 by seeing patients at home rather than in the office."  "What is the BEST phone number to call the day of the visit?" - include this in appointment notes  "Do you have or have access to (through a family member/friend) a smartphone with video capability that we can use for your visit?" If yes - list this number in appt notes as "cell" (if different from BEST phone #) and list the appointment type as a VIDEO visit in appointment notes If no - list the appointment type as a PHONE visit in appointment notes  Confirm consent - "In the setting of the current Covid19 crisis, you are scheduled for a (phone or video) visit with your provider on (date) at (time).  Just as we do with many in-office visits, in order for you to participate in this visit, we must obtain consent.  If you'd like, I can send this to your mychart (if signed up) or email for you to review.  Otherwise, I can obtain your verbal consent now.  All virtual visits are billed to your insurance company just like a normal visit would be.  By agreeing to a virtual visit, we'd like you to understand that the technology does not allow for your provider to perform an examination, and thus may limit your provider's ability to fully assess your condition. If your provider identifies any concerns that need to be evaluated in person, we will make arrangements to do so.  Finally, though the technology is pretty good, we cannot assure that it will always work on either your or our end, and in the setting of a video visit, we may have to convert it to a phone-only visit.  In either situation, we cannot ensure that we have a secure connection.  Are you willing to proceed?" STAFF: Did the patient verbally acknowledge consent to telehealth visit? Document YES/NO here:    Advise patient to be prepared - "Two hours prior to your appointment, go ahead and check your blood pressure, pulse, oxygen saturation, and your weight (if you have the equipment to check those) and write them all down. When your visit starts, your provider will ask you for this information. If you have an Apple Watch or Kardia device, please plan to have heart rate information ready on the day of your appointment. Please have a pen and paper handy nearby the day of the visit as well."  Give patient instructions for MyChart download to smartphone OR [email or web address] as below if video visit (depending on what platform provider is using)  Inform patient they will receive a phone call 15 minutes prior to their appointment time (may be from unknown caller ID) so they should be prepared to answer    TELEPHONE CALL NOTE  MARVINE SHIPE has been deemed a candidate for a follow-up tele-health visit to limit community exposure during the Covid-19 pandemic. I spoke with the patient via phone to ensure availability of phone/video source, confirm preferred email & phone number, and discuss instructions and expectations.  I reminded HILLORY KLINESMITH to be prepared with any vital sign and/or heart rhythm information that could potentially be obtained via home monitoring, at the time of her visit. I reminded KAILEE GARNO to expect a phone call prior to her visit.  Dorothey Baseman 01/18/2020 10:21 AM  INSTRUCTIONS FOR DOWNLOADING THE MYCHART APP TO SMARTPHONE  - The patient must first make sure to have activated MyChart and know their login information - If Apple, go to [employer] and type in MyChart in the search bar and download the app. If Android, ask patient to go to [employer] and type in [place] in the search bar and download the app. The app is free but as with any other app downloads, their phone may require them to verify saved payment information or Apple/Android password.  - The patient  will need to then log into the app with their MyChart username and password, and select [facility] as their healthcare provider to link the account. When it is time for your visit, go to the MyChart app, find appointments, and click Begin Video Visit. Be sure to Select Allow for your device to access the Microphone and Camera for your visit. You will then be connected, and your provider will be with you shortly.  **If they have any issues connecting, or need assistance please contact MyChart service desk [phone])**  **If using a computer, in order to ensure the best quality for their visit they will need to use either of the following Internet Browsers: [employer], or Google Chrome**  IF USING DOXIMITY or DOXY.ME - The patient will receive a link just prior to their visit by text.     FULL LENGTH CONSENT FOR TELE-HEALTH VISIT   I hereby voluntarily request, consent and authorize [facility] and its employed or contracted physicians, physician assistants, nurse practitioners or other licensed health care professionals (the Practitioner), to provide me with telemedicine health care services (the "Services") as deemed necessary by the treating Practitioner. I acknowledge and consent to receive the Services by the Practitioner via telemedicine. I understand that the telemedicine visit will involve communicating with the Practitioner through live audiovisual communication technology and the disclosure of certain medical information by electronic transmission. I acknowledge that I have been given the opportunity to request an in-person assessment or other available alternative prior to the telemedicine visit and am voluntarily participating in the telemedicine visit.  I understand that I have the right to withhold or withdraw my consent to the use of telemedicine in the course of my care at any time, without affecting my right to future care or treatment, and that the Practitioner  or I may terminate the telemedicine visit at any time. I understand that I have the right to inspect all information obtained and/or recorded in the course of the telemedicine visit and may receive copies of available information for a reasonable fee.  I understand that some of the potential risks of receiving the Services via telemedicine include:  Delay or interruption in medical evaluation due to technological equipment failure or disruption; Information transmitted may not be sufficient (e.g. poor resolution of images) to allow for appropriate medical decision making by the Practitioner; and/or  In rare instances, security protocols could fail, causing a breach of personal health information.  Furthermore, I acknowledge that it is my responsibility to provide information about my medical history, conditions and care that is complete and accurate to the best of my ability. I acknowledge that Practitioner's advice, recommendations, and/or decision may be based on factors not within their control, such as incomplete or inaccurate data provided by me or distortions of diagnostic images or specimens that may result from electronic transmissions. I understand that the practice of medicine is not an exact science and that Practitioner  makes no warranties or guarantees regarding treatment outcomes. I acknowledge that I will receive a copy of this consent concurrently upon execution via email to the email address I last provided but may also request a printed copy by calling the office of [facility].    I understand that my insurance will be billed for this visit.   I have read or had this consent read to me. I understand the contents of this consent, which adequately explains the benefits and risks of the Services being provided via telemedicine.  I have been provided ample opportunity to ask questions regarding this consent and the Services and have had my questions answered to my satisfaction. I give  my informed consent for the services to be provided through the use of telemedicine in my medical care  By participating in this telemedicine visit I agree to the above.

## 2020-01-31 ENCOUNTER — Ambulatory Visit: Payer: Medicare Other

## 2020-02-01 ENCOUNTER — Encounter: Payer: Self-pay | Admitting: Gastroenterology

## 2020-02-11 ENCOUNTER — Ambulatory Visit: Payer: Medicare Other

## 2020-02-14 DIAGNOSIS — Z23 Encounter for immunization: Secondary | ICD-10-CM | POA: Diagnosis not present

## 2020-02-19 ENCOUNTER — Telehealth: Payer: Self-pay

## 2020-02-24 DIAGNOSIS — J302 Other seasonal allergic rhinitis: Secondary | ICD-10-CM | POA: Diagnosis not present

## 2020-02-24 DIAGNOSIS — Z516 Encounter for desensitization to allergens: Secondary | ICD-10-CM | POA: Diagnosis not present

## 2020-02-27 ENCOUNTER — Other Ambulatory Visit: Payer: Self-pay | Admitting: Gastroenterology

## 2020-03-09 ENCOUNTER — Ambulatory Visit (INDEPENDENT_AMBULATORY_CARE_PROVIDER_SITE_OTHER): Payer: Medicare Other | Admitting: Gastroenterology

## 2020-03-09 ENCOUNTER — Other Ambulatory Visit: Payer: Self-pay

## 2020-03-09 ENCOUNTER — Encounter: Payer: Self-pay | Admitting: Gastroenterology

## 2020-03-09 DIAGNOSIS — K219 Gastro-esophageal reflux disease without esophagitis: Secondary | ICD-10-CM

## 2020-03-09 DIAGNOSIS — R1310 Dysphagia, unspecified: Secondary | ICD-10-CM

## 2020-03-09 MED ORDER — OMEPRAZOLE 40 MG PO CPDR: DELAYED_RELEASE_CAPSULE | ORAL | 3 refills | Status: DC

## 2020-03-09 NOTE — Progress Notes (Signed)
Subjective:    Patient ID: Christine Leblanc, female    DOB: [birth year], 86 y.o.   MRN: [id number]   Celene Squibb, MD  HPI SWALLOWING THE SAME. WEIGHT STABLE. HAVEN'T BEEN EXERCISING THROAT LIKE SHE SHOULD. WHEN POWER WENT OUT HAD TO THROW EVERYTHING OUT. BMs: ABOUT EVERY DAY. TAKES TO AVOID CONSTIPATION. CAN HAVE DISCOMFORT IN RUQ WHEN SHE BENDS OVER TO TOUCH HER FEET OR PUT HER SHOES ON. IF SHE STAYS IN ONE SPOT SHE WILL NOTICE IT.   THINKS ITS DUE TO HER R HIP BEING HIGHER THAN THE OTHER. CAN FIND SHOES THAT MAKE LEFT TOES COMFORTABLE. GETTING 2ND COVID SHOT SUN.  PT DENIES FEVER, CHILLS, HEMATOCHEZIA, HEMATEMESIS, nausea, vomiting, melena, diarrhea, CHEST PAIN, SHORTNESS OF BREATH, CHANGE IN BOWEL IN HABITS, OR heartburn or indigestion.  Past Medical History:  Diagnosis Date  . Anemia   . Asthma   . Cancer ([place])    skin Ca- ? basal cell   . Chronic back pain   . Chronic neck pain   . Degenerative joint disease    Left shoulder; cervical spine, knees & hands   . Gastroesophageal reflux disease    Hiatal hernia; distal esophageal web requiring dilatation; gastric polyps; gastritis; refuses colonoscopy  . GERD (gastroesophageal reflux disease)   . History of stress test 1990's   stress test done under the care of Dr. Lattie Haw & Dr. Gwenlyn Found, now being followed by Dr. Harl Bowie- in [place] , recently seen & told to f/U in one yr.   . Hyperlipidemia 04/27/2011  . Hypertension   . Hypothyroidism   . Lymphocytic thyroiditis   . Multiple thyroid nodules 2010   Adenomatous; thyroidectomy in 2010  . Osteoarthritis of right knee 07/27/2014  . Pneumonia    hosp. for pneumonia- long time ago   . Primary localized osteoarthrosis of right shoulder 06/21/2015  . Seizures ([place])    yes- as a child- & into adult years, states she took med. for them at one time, stopped at 32 yrs. of age   . Syncope and collapse 2007   Possible CVA in 2012 with left lower extremity weakness; refused hospitalization;  CT-Atrophy and chronic microvascular ischemic change.    Past Surgical History:  Procedure Laterality Date  . ABDOMINAL HYSTERECTOMY     fibroids  . CATARACT EXTRACTION     Bilateral; redo surgery on the right for incomplete primary procedure  . COLONOSCOPY  Remote  . ESOPHAGOGASTRODUODENOSCOPY  12/10   Dr. 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 ESOPHAGOGASTRODUODENOSCOPY N/A 03/21/2015   Procedure: ESOPHAGOGASTRODUODENOSCOPY (EGD);  Surgeon: Danie Binder, MD;  [facility];  Service: Endoscopy;  Laterality: N/A;  1130   . EYE SURGERY    . PROLAPSED UTERINE FIBROID LIGATION     outcomed with rectocele & cystocele  . SAVORY DILATION N/A 03/21/2015   Procedure: SAVORY DILATION;  Surgeon: Danie Binder, MD;  [facility];  Service: Endoscopy;  Laterality: N/A;  . SHOULDER SURGERY     left  . TOTAL KNEE ARTHROPLASTY Right 07/27/2014   Procedure: RIGHT TOTAL KNEE ARTHROPLASTY;  Surgeon: Johnny Bridge, MD;  Location: [place];  Service: Orthopedics;  Laterality: Right;  . TOTAL SHOULDER ARTHROPLASTY Right 06/21/2015   Procedure: RIGHT TOTAL SHOULDER ARTHROPLASTY;  Surgeon: Marchia Bond, MD;  Location: [place];  Service: Orthopedics;  Laterality: Right;  . TOTAL THYROIDECTOMY  2010   Allergies  Allergen Reactions  . Celecoxib  Shortness Of Breath  . Dexilant [Dexlansoprazole] Anaphylaxis    abd pain  . Famotidine     Makes her feel "very bad"   Current Outpatient Medications  Medication Sig    . albuterol (PROVENTIL HFA) 108 (90 BASE) MCG/ACT inhaler Inhale 1-2 puffs into the lungs every 6 (six) hours as needed for wheezing or shortness of breath. Wheezing, Asthma Symptoms    . aspirin 81 MG tablet Take 81 mg by mouth daily.    . benzonatate (TESSALON) 200 MG capsule Take 200 mg by mouth 2 (two) times daily as needed.     Marland Kitchen CALCIUM-VITAMIN D PO Take by mouth. Calcium 600 mg and Vitamin D 1000 unit. One tablet  daily    . diazepam (VALIUM) 5 MG tablet Take one tablet by mouth at bedtime as needed for sleep    . EPINEPHrine 0.3 mg/0.3 mL IJ SOAJ injection Inject into the muscle as needed for anaphylaxis.    . ferrous sulfate 325 (65 FE) MG tablet Take 325 mg by mouth daily with breakfast.    . ipratropium (ATROVENT) 0.03 % nasal spray Place 2 sprays into both nostrils daily as needed (allergies stuffy nose).     Marland Kitchen levocetirizine (XYZAL) 5 MG tablet Take 5 mg by mouth every evening.      Marland Kitchen levothyroxine (SYNTHROID) 75 MCG tablet Take 1 tablet (75 mcg total) by mouth daily. Extra 1/2 tab on Sundays    . montelukast (SINGULAIR) 10 MG tablet Take 10 mg by mouth at bedtime.    Marland Kitchen omeprazole (PRILOSEC) 40 MG capsule TAKE 1 CAPSULE BY MOUTH 30 MINUTES PRIOR TO BREAKFAST.    Marland Kitchen pravastatin (PRAVACHOL) 80 MG tablet TAKE 1 TABLET EVERY EVENING (NEEDS APPOINTMENT FOR REFILLS, CALL [phone])    . Pyridoxine HCl (VITAMIN B6 PO) Take by mouth at bedtime.    . senna (SENOKOT) 8.6 MG tablet Take 2 tablets by mouth at bedtime.     . vitamin B-12 (CYANOCOBALAMIN) 1000 MCG tablet Take 1,000 mcg by mouth every morning.    . vitamin C (ASCORBIC ACID) 500 MG tablet Take 500 mg by mouth daily.    . vitamin E 400 UNIT capsule Take 400 Units by mouth at bedtime.      Review of Systems PER HPI OTHERWISE ALL SYSTEMS ARE NEGATIVE.    Objective:   Physical Exam Constitutional:      General: She is not in acute distress.    Appearance: Normal appearance.  HENT:     Mouth/Throat:     Comments: MASK IN PLACE Eyes:     General: No scleral icterus.    Pupils: Pupils are equal, round, and reactive to light.  Cardiovascular:     Rate and Rhythm: Normal rate and regular rhythm.     Pulses: Normal pulses.     Heart sounds: Normal heart sounds.  Pulmonary:     Effort: Pulmonary effort is normal.     Breath sounds: Normal breath sounds.  Abdominal:     General: Bowel sounds are normal.     Palpations: Abdomen is soft.      Tenderness: There is no abdominal tenderness.  Musculoskeletal:        General: Deformity (BILATERAL TOES AND HANDS) present.     Cervical back: Normal range of motion.     Right lower leg: No edema.     Left lower leg: No edema.  Lymphadenopathy:     Cervical: No cervical adenopathy.  Skin:    General: Skin  is warm and dry.  Neurological:     Mental Status: She is alert and oriented to person, place, and time.     Comments: NO  NEW FOCAL DEFICITS  Psychiatric:        Mood and Affect: Mood normal.     Comments: FLAT AFFECT       Assessment & Plan:

## 2020-03-09 NOTE — Patient Instructions (Signed)
DRINK WATER TO KEEP YOUR URINE LIGHT YELLOW.  AVOID REFLUX TRIGGERS. SEE INFO BELOW.  KEEP DOING YOUR SWALLOWING EXERCISES.  CONTINUE OMEPRAZOLE.  TAKE 30 MINUTES PRIOR TO YOUR FIRST MEAL.  FOLLOW UP IN 1 YEAR.   Lifestyle and home remedies TO CONTROL HEARTBURN You may eliminate or reduce the frequency of heartburn by making the following lifestyle changes:  . Control your weight. Being overweight is a major risk factor for heartburn and GERD. Excess pounds put pressure on your abdomen, pushing up your stomach and causing acid to back up into your esophagus.   . Eat smaller meals. 4 TO 6 MEALS A DAY. This reduces pressure on the lower esophageal sphincter, helping to prevent the valve from opening and acid from washing back into your esophagus.   Dolphus Jenny your belt. Clothes that fit tightly around your waist put pressure on your abdomen and the lower esophageal sphincter.   . Eliminate heartburn triggers. Everyone has specific triggers. Common triggers such as fatty or fried foods, spicy food, tomato sauce, carbonated beverages, alcohol, chocolate, mint, garlic, onion, caffeine and nicotine may make heartburn worse.   Marland Kitchen Avoid stooping or bending. Tying your shoes is OK. Bending over for longer periods to weed your garden isn't, especially soon after eating.   . Don't lie down after a meal. Wait at least three to four hours after eating before going to bed, and don't lie down right after eating.   Alternative medicine . Several home remedies exist for treating GERD, but they provide only temporary relief. They include drinking baking soda (sodium bicarbonate) added to water or drinking other fluids such as baking soda mixed with cream of tartar and water. . Although these liquids create temporary relief by neutralizing, washing away or buffering acids, eventually they aggravate the situation by adding gas and fluid to your stomach, increasing pressure and causing more acid reflux. Further,  adding more sodium to your diet may increase your blood pressure and add stress to your heart, and excessive bicarbonate ingestion can alter the acid-base balance in your body.

## 2020-03-09 NOTE — Assessment & Plan Note (Signed)
SYMPTOMS CONTROLLED/RESOLVED.  DRINK WATER TO KEEP YOUR URINE LIGHT YELLOW. AVOID REFLUX TRIGGERS.  HANDOUT GIVEN. CONTINUE OMEPRAZOLE.  TAKE 30 MINUTES PRIOR TO YOUR FIRST MEAL. FOLLOW UP IN 1 YEAR.

## 2020-03-09 NOTE — Assessment & Plan Note (Signed)
SYMPTOMS FAIRLY WELL CONTROLLED.  KEEP DOING YOUR SWALLOWING EXERCISES. CONTINUE TO MONITOR SYMPTOMS. FOLLOW UP IN 1 YEAR.

## 2020-03-13 DIAGNOSIS — Z23 Encounter for immunization: Secondary | ICD-10-CM | POA: Diagnosis not present

## 2020-03-31 DIAGNOSIS — Z516 Encounter for desensitization to allergens: Secondary | ICD-10-CM | POA: Diagnosis not present

## 2020-03-31 DIAGNOSIS — J302 Other seasonal allergic rhinitis: Secondary | ICD-10-CM | POA: Diagnosis not present

## 2020-04-08 ENCOUNTER — Telehealth: Payer: Self-pay | Admitting: Endocrinology

## 2020-04-08 ENCOUNTER — Other Ambulatory Visit: Payer: Self-pay

## 2020-04-08 MED ORDER — LEVOTHYROXINE SODIUM 75 MCG PO TABS: 75.0000 ug | ORAL_TABLET | Freq: Every day | ORAL | 0 refills | Status: DC

## 2020-04-08 NOTE — Telephone Encounter (Signed)
Rx sent 

## 2020-04-08 NOTE — Telephone Encounter (Signed)
Have you contacted your pharmacy to initiate the refill request?     Patient did not like this questions and did not answer it       If no then please ask them to call the pharmacy to start the request but if they insist proceed to next questions  What is the name of the medication/medications? levothyroxine (SYNTHROID) 75 MCG tablet        If it is for test strips or lancets please confirm the brand  Is this for a 90 day? Yes with 3 refills  What is the name and location of the pharmacy you would like to use?    [place], [place] Phone:  [phone]  Fax:  [phone]

## 2020-04-25 ENCOUNTER — Ambulatory Visit: Payer: Medicare Other | Admitting: Endocrinology

## 2020-04-26 ENCOUNTER — Encounter: Payer: Self-pay | Admitting: Endocrinology

## 2020-04-26 ENCOUNTER — Other Ambulatory Visit: Payer: Self-pay

## 2020-04-26 ENCOUNTER — Ambulatory Visit (INDEPENDENT_AMBULATORY_CARE_PROVIDER_SITE_OTHER): Payer: Medicare Other | Admitting: Endocrinology

## 2020-04-26 VITALS — BP 114/80 | HR 93 | Ht 62.0 in | Wt 113.4 lb

## 2020-04-26 DIAGNOSIS — E890 Postprocedural hypothyroidism: Secondary | ICD-10-CM

## 2020-04-26 LAB — T4, FREE: Free T4: 1.73 ng/dL — ABNORMAL HIGH (ref 0.60–1.60)

## 2020-04-26 LAB — TSH: TSH: 0.19 u[IU]/mL — ABNORMAL LOW (ref 0.35–4.50)

## 2020-04-26 NOTE — Progress Notes (Signed)
Christine Leblanc 86 y.o.    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  She has been followed every 6-12 months for her thyroid supplementation and monitoring of her levels However has not been seen in follow-up since 9/18  She previously had been on 100 mcg since 03/2014  This was subsequently reduced down to 75 g once daily On her last visit because of fatigue and also her TSH being 4.7 she was told to take an extra half pill on Sundays and she is doing some  She continues to have some fatigue which is not any different She has difficulty eating and chewing and recently has lost 3 pounds No palpitations or shakiness  She has not missed any doses She now apparently is getting Synthroid brand from her [place] As before she takes Prilosec daily in the mornings also  She continues to take iron and calcium and is taking these at least 2 hours after Synthroid   TSH on her last visit was 4.7  Wt Readings from Last 3 Encounters:  04/26/20 113 lb 6.4 oz (51.4 kg)  03/09/20 116 lb 3.2 oz (52.7 kg)  01/18/20 115 lb (52.2 kg)   Lab Results  Component Value Date   TSH 4.66 12/11/2019   TSH 2.19 09/11/2018   TSH 1.150 09/12/2017   FREET4 1.22 09/11/2018   FREET4 1.78 (H) 09/12/2017   FREET4 1.75 (H) 05/01/2017       Allergies as of 04/26/2020      Reactions   Celecoxib Shortness Of Breath   Dexilant [dexlansoprazole] Anaphylaxis   abd pain   Famotidine    Makes her feel "very bad"      Medication List       Accurate as of April 26, 2020  1:45 PM. If you have any questions, ask your nurse or doctor.        aspirin 81 MG tablet Take 81 mg by mouth daily.   benzonatate 200 MG capsule Commonly known as:  TESSALON Take 200 mg by mouth 2 (two) times daily as needed.   CALCIUM-VITAMIN D PO Take by mouth. Calcium 600 mg and Vitamin D 1000 unit. One tablet daily   diazepam 5 MG tablet Commonly known as: VALIUM Take one tablet by mouth at bedtime as needed for sleep   EPINEPHrine 0.3 mg/0.3 mL Soaj injection Commonly known as: EPI-PEN Inject into the muscle as needed for anaphylaxis.   ferrous sulfate 325 (65 FE) MG tablet Take 325 mg by mouth daily with breakfast.   ipratropium 0.03 % nasal spray Commonly known as: ATROVENT Place 2 sprays into both nostrils daily as needed (allergies stuffy nose).   levocetirizine 5 MG tablet Commonly known as: XYZAL Take 5 mg by mouth every evening.   levothyroxine 75 MCG tablet Commonly known as: SYNTHROID Take 1 tablet (75 mcg total) by mouth daily. Extra 1/2 tab on Sundays   montelukast 10 MG tablet Commonly known as: SINGULAIR Take 10 mg by mouth at bedtime.   omeprazole 40 MG capsule Commonly known as: PRILOSEC TAKE 1 CAPSULE BY MOUTH 30 MINUTES PRIOR TO BREAKFAST.   pravastatin 80 MG tablet Commonly known as: PRAVACHOL TAKE 1 TABLET EVERY EVENING (NEEDS APPOINTMENT FOR REFILLS, CALL [phone])   Proventil  HFA 108 (90 Base) MCG/ACT inhaler Generic drug: albuterol Inhale 1-2 puffs into the lungs every 6 (six) hours as needed for wheezing or shortness of breath. Wheezing, Asthma Symptoms   senna 8.6 MG tablet Commonly known as: SENOKOT Take 2 tablets by mouth at bedtime.   vitamin B-12 1000 MCG tablet Commonly known as: CYANOCOBALAMIN Take 1,000 mcg by mouth every morning.   VITAMIN B6 PO Take by mouth at bedtime.   vitamin C 500 MG tablet Commonly known as: ASCORBIC ACID Take 500 mg by mouth daily.   vitamin E 180 MG (400 UNITS) capsule Take 400 Units by mouth at bedtime.       Allergies:  Allergies  Allergen Reactions  . Celecoxib Shortness Of Breath  . Dexilant [Dexlansoprazole] Anaphylaxis    abd pain  .  Famotidine     Makes her feel "very bad"    Past Medical History:  Diagnosis Date  . Anemia   . Asthma   . Cancer ([place])    skin Ca- ? basal cell   . Chronic back pain   . Chronic neck pain   . Degenerative joint disease    Left shoulder; cervical spine, knees & hands   . Gastroesophageal reflux disease    Hiatal hernia; distal esophageal web requiring dilatation; gastric polyps; gastritis; refuses colonoscopy  . GERD (gastroesophageal reflux disease)   . History of stress test 1990's   stress test done under the care of Dr. Lattie Haw & Dr. Gwenlyn Found, now being followed by Dr. Harl Bowie- in [place] , recently seen & told to f/U in one yr.   . Hyperlipidemia 04/27/2011  . Hypertension   . Hypothyroidism   . Lymphocytic thyroiditis   . Multiple thyroid nodules 2010   Adenomatous; thyroidectomy in 2010  . Osteoarthritis of right knee 07/27/2014  . Pneumonia    hosp. for pneumonia- long time ago   . Primary localized osteoarthrosis of right shoulder 06/21/2015  . Seizures ([place])    yes- as a child- & into adult years, states she took med. for them at one time, stopped at 32 yrs. of age   . Syncope and collapse 2007   Possible CVA in 2012 with left lower extremity weakness; refused hospitalization; CT-Atrophy and chronic microvascular ischemic change.     Past Surgical History:  Procedure Laterality Date  . ABDOMINAL HYSTERECTOMY     fibroids  . CATARACT EXTRACTION     Bilateral; redo surgery on the right for incomplete primary procedure  . COLONOSCOPY  Remote  . ESOPHAGOGASTRODUODENOSCOPY  12/10   Dr. 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 ESOPHAGOGASTRODUODENOSCOPY N/A 03/21/2015   Procedure: ESOPHAGOGASTRODUODENOSCOPY (EGD);  Surgeon: Danie Binder, MD;  [facility];  Service: Endoscopy;  Laterality: N/A;  1130   . EYE SURGERY    . PROLAPSED UTERINE FIBROID LIGATION     outcomed with rectocele & cystocele  .  SAVORY DILATION N/A 03/21/2015   Procedure: SAVORY DILATION;  Surgeon: Danie Binder, MD;  [facility];  Service: Endoscopy;  Laterality: N/A;  . SHOULDER SURGERY     left  . TOTAL KNEE ARTHROPLASTY Right 07/27/2014   Procedure: RIGHT TOTAL KNEE ARTHROPLASTY;  Surgeon: Johnny Bridge, MD;  Location: [facility];  Service: Orthopedics;  Laterality: Right;  . TOTAL SHOULDER ARTHROPLASTY Right 06/21/2015   Procedure: RIGHT TOTAL SHOULDER ARTHROPLASTY;  Surgeon: Marchia Bond, MD;  Location: [facility];  Service: Orthopedics;  Laterality: Right;  .  TOTAL THYROIDECTOMY  2010    Family History  Problem Relation Age of Onset  . Heart disease Mother   . Gallbladder disease Mother   . Heart failure Mother   . Diabetes Mother   . Breast cancer Sister        30 years ago  . Diabetes Sister   . Heart attack Father   . Diabetes Maternal Grandmother   . Colon cancer Neg Hx   . Stroke Neg Hx     Social History:  reports that she quit smoking about 41 years ago. Her smoking use included cigarettes. She has a 16.00 pack-year smoking history. She has never used smokeless tobacco. She reports that she does not drink alcohol or use drugs.  REVIEW Of SYSTEMS:  ROS  Weight History:  Wt Readings from Last 3 Encounters:  04/26/20 113 lb 6.4 oz (51.4 kg)  03/09/20 116 lb 3.2 oz (52.7 kg)  01/18/20 115 lb (52.2 kg)    History of hypercholesterolemia followed by PCP, treated with pravastatin 80 mg   She has chronic difficulty swallowing   Examination:   BP 114/80 (BP Location: Left Arm, Patient Position: Sitting, Cuff Size: Normal)   Pulse 93   Ht 5\' 2"  (1.575 m)   Wt 113 lb 6.4 oz (51.4 kg)   SpO2 96%   BMI 20.74 kg/m   Exam not indicated     Assessments   Hypothyroidism, postsurgical and long-standing  She has been taking 75 mcg levothyroxine with usually stable levels of TSH She has had fatigue which is likely multifactorial and not always correlating with thyroid levels or  change in dosage She has taken her Synthroid on empty stomach and is taking her calcium and iron later than before  She has lost weight but this is from her difficulty with dysphagia and has to eat slowly  She is also now taking Synthroid as this was sent by her pharmacy instead of generic It is not clear if this would affect her levels She will have thyroid levels today and will adjust her medications and schedule follow-up accordingly     There are no Patient Instructions on file for this visit.   Elayne Snare 04/26/2020, 1:45 PM     Addendum: TSH is 0.19, she will continue brand-name Synthroid but take only 6-1/2 tablets a week now

## 2020-05-04 DIAGNOSIS — J302 Other seasonal allergic rhinitis: Secondary | ICD-10-CM | POA: Diagnosis not present

## 2020-05-19 ENCOUNTER — Encounter: Payer: Self-pay | Admitting: Physician Assistant

## 2020-05-19 ENCOUNTER — Telehealth: Payer: Self-pay

## 2020-05-19 DIAGNOSIS — I779 Disorder of arteries and arterioles, unspecified: Secondary | ICD-10-CM | POA: Insufficient documentation

## 2020-05-19 NOTE — Telephone Encounter (Signed)
FYI While making reminding call for appt.tomorrow. Pt confirmed she would arrive because she has been dizzy.  Pt did not request Nurse call.   Thanks renee

## 2020-05-19 NOTE — Progress Notes (Addendum)
Cardiology Office Note    Date:  05/20/2020   ID:  Christine Leblanc, DOB Nov 17, 1929, MRN [id number]  PCP:  Celene Squibb, MD  Cardiologist:  Carlyle Dolly, MD  Electrophysiologist:  None   Chief Complaint: f/u near-syncope  History of Present Illness:   Christine Leblanc is a 86 y.o. female with history of HTN, HLD (followed by primary care), TIA, mild carotid artery disease, rare PACs/PVCs, RBBB, anemia, CKD stage III by labs (baseline Cr 1.1-1.2), asthma, DJD, GERD, hiatal hernia, esophageal web requiring dilation, hypothyroidism, lymphocytic thyroiditis, OA, seizures who presents for follow-up. SHe was previously seen in 2019 for predominately positional dizziness with falls x 2. January 2020 monitor showed rare ectopy, no significant arrhythmias. Symptoms were felt orthostatic in nature. No prior echocardiogram. Carotid duplex 2012 with mild bilateral plaque <50%. At last f/u 01/2020 was doing well without symptoms. Last labs personally reviewed: 03/2020 TSH suppressed with elevated FT4 prompting synthyroid adjustment, 12/2019 LFTs wnl, LDL 77, Cr 1.1, K 4.2, Hgb 11.5 (previous values in the 9 range).  She presents back for evaluation of near-syncope. She reports 2 prior episodes of syncope many years back, the last at least 3-4 years ago, but had not had any in quite some time. She states these were attributed to dehydration. They occurred without warning at that time. On Monday she had some issues with diarrhea. She was downstairs in her basement getting ready to sell a treadmill to someone when a feeling of abdominal discomfort/fecal urgency washed over her associated with feeling that she was going to pass out. She was able to make it upstairs to the bathroom. She felt weak afterwards in a general sense but otherwise regained her bearings and went about her day. On Tuesday she had two more similar episodes, once in the morning and once in the afternoon while tending to dinner. She was standing  each time this happened. She denies any palpitations, chest pain, dyspnea around the time of event. She had not eaten much before it happened on Monday. She has not had any edema. She has felt fine since the last episode on Tuesday. She is very detail oriented when sharing her prior history.   Past Medical History:  Diagnosis Date  . Anemia   . Asthma   . Cancer ([place])    skin Ca- ? basal cell   . Chronic back pain   . Chronic neck pain   . Degenerative joint disease    Left shoulder; cervical spine, knees & hands   . Gastroesophageal reflux disease    Hiatal hernia; distal esophageal web requiring dilatation; gastric polyps; gastritis; refuses colonoscopy  . GERD (gastroesophageal reflux disease)   . History of stress test 1990's   stress test done under the care of Dr. Lattie Haw & Dr. Gwenlyn Found, now being followed by Dr. Harl Bowie- in [place] , recently seen & told to f/U in one yr.   . Hyperlipidemia 04/27/2011  . Hypertension   . Hypothyroidism   . Lymphocytic thyroiditis   . Mild carotid artery disease ([place])   . Multiple thyroid nodules 2010   Adenomatous; thyroidectomy in 2010  . Orthostasis   . Osteoarthritis of right knee 07/27/2014  . Pneumonia    hosp. for pneumonia- long time ago   . Primary localized osteoarthrosis of right shoulder 06/21/2015  . Seizures ([place])    yes- as a child- & into adult years, states she took med. for them at one time, stopped at 32 yrs. of  age   . Syncope and collapse 2007   Possible CVA in 2012 with left lower extremity weakness; refused hospitalization; CT-Atrophy and chronic microvascular ischemic change.     Past Surgical History:  Procedure Laterality Date  . ABDOMINAL HYSTERECTOMY     fibroids  . CATARACT EXTRACTION     Bilateral; redo surgery on the right for incomplete primary procedure  . COLONOSCOPY  Remote  . ESOPHAGOGASTRODUODENOSCOPY  12/10   Dr. Oneida Alar: probable distal web s/p dilation small hiatal hernia/gastric polyps/mild  gastritis  . ESOPHAGOGASTRODUODENOSCOPY N/A 03/21/2015   Procedure: ESOPHAGOGASTRODUODENOSCOPY (EGD);  Surgeon: Danie Binder, MD;  [facility];  Service: Endoscopy;  Laterality: N/A;  1130   . EYE SURGERY    . PROLAPSED UTERINE FIBROID LIGATION     outcomed with rectocele & cystocele  . SAVORY DILATION N/A 03/21/2015   Procedure: SAVORY DILATION;  Surgeon: Danie Binder, MD;  [facility];  Service: Endoscopy;  Laterality: N/A;  . SHOULDER SURGERY     left  . TOTAL KNEE ARTHROPLASTY Right 07/27/2014   Procedure: RIGHT TOTAL KNEE ARTHROPLASTY;  Surgeon: Johnny Bridge, MD;  Location: [facility];  Service: Orthopedics;  Laterality: Right;  . TOTAL SHOULDER ARTHROPLASTY Right 06/21/2015   Procedure: RIGHT TOTAL SHOULDER ARTHROPLASTY;  Surgeon: Marchia Bond, MD;  Location: [facility];  Service: Orthopedics;  Laterality: Right;  . TOTAL THYROIDECTOMY  2010    Current Medications: Current Meds  Medication Sig  . aspirin 81 MG tablet Take 81 mg by mouth daily.  Marland Kitchen CALCIUM-VITAMIN D PO Take by mouth. Calcium 600 mg and Vitamin D 1000 unit. One tablet daily  . diazepam (VALIUM) 5 MG tablet Take one tablet by mouth at bedtime as needed for sleep  . EPINEPHrine 0.3 mg/0.3 mL IJ SOAJ injection Inject into the muscle as needed for anaphylaxis.  . ferrous sulfate 325 (65 FE) MG tablet Take 325 mg by mouth daily with breakfast.  . ipratropium (ATROVENT) 0.03 % nasal spray Place 2 sprays into both nostrils daily as needed (allergies stuffy nose).   Marland Kitchen levocetirizine (XYZAL) 5 MG tablet Take 5 mg by mouth every evening.    Marland Kitchen levothyroxine (SYNTHROID) 75 MCG tablet Take 75 mcg by mouth daily before breakfast. Take 1/2 tablet on Sunday  . montelukast (SINGULAIR) 10 MG tablet Take 10 mg by mouth at bedtime.  Marland Kitchen omeprazole (PRILOSEC) 40 MG capsule TAKE 1 CAPSULE BY MOUTH 30 MINUTES PRIOR TO BREAKFAST.  Marland Kitchen pravastatin (PRAVACHOL) 80 MG tablet TAKE 1 TABLET EVERY EVENING (NEEDS APPOINTMENT FOR  REFILLS, CALL [phone])  . Pyridoxine HCl (VITAMIN B6 PO) Take by mouth at bedtime.  . senna (SENOKOT) 8.6 MG tablet Take 2 tablets by mouth at bedtime.   . vitamin B-12 (CYANOCOBALAMIN) 1000 MCG tablet Take 1,000 mcg by mouth every morning.  . vitamin C (ASCORBIC ACID) 500 MG tablet Take 500 mg by mouth daily.  . vitamin E 400 UNIT capsule Take 400 Units by mouth at bedtime.   . [DISCONTINUED] levothyroxine (SYNTHROID) 75 MCG tablet Take 1 tablet (75 mcg total) by mouth daily. Extra 1/2 tab on Sundays (Patient taking differently: Take 37.5 mcg by mouth as directed. Take 1/2 tablet daily)     Allergies:   Celecoxib, Dexilant [dexlansoprazole], and Famotidine   Social History   Socioeconomic History  . Marital status: Widowed    Spouse name: Not on file  . Number of children: 4  . Years of education: 34  . Highest education  level: Not on file  Occupational History  . 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 Occupation: Retired    Comment: Marine scientist  Tobacco Use  . Smoking status: Former Smoker    Packs/day: 0.80    Years: 20.00    Pack years: 16.00    Types: Cigarettes    Quit date: 12/31/1978    Years since quitting: 41.4  . Smokeless tobacco: Never Used  Substance and Sexual Activity  . Alcohol use: No  . Drug use: No  . Sexual activity: Never    Birth control/protection: Surgical    Comment: widowed since 2010  Other Topics Concern  . Not on file  Social History Narrative   Lives at home. Her daughter lives with her.    Caffeine use: 1 cup coffee per day   Social Determinants of Health   Financial Resource Strain:   . Difficulty of Paying Living Expenses:   Food Insecurity:   . Worried About Charity fundraiser in the Last Year:   . Arboriculturist in the Last Year:   Transportation Needs:   . Film/video editor (Medical):   Marland Kitchen Lack of Transportation (Non-Medical):   Physical Activity:   . Days of Exercise per Week:   . Minutes of  Exercise per Session:   Stress:   . Feeling of Stress :   Social Connections:   . Frequency of Communication with Friends and Family:   . Frequency of Social Gatherings with Friends and Family:   . Attends Religious Services:   . Active Member of Clubs or Organizations:   . Attends Archivist Meetings:   Marland Kitchen Marital Status:      Family History:  The patient's family history includes Breast cancer in her sister; Diabetes in her maternal grandmother, mother, and sister; Gallbladder disease in her mother; Heart attack in her father; Heart disease in her mother; Heart failure in her mother. There is no history of Colon cancer or Stroke.  ROS:   Please see the history of present illness.  All other systems are reviewed and otherwise negative.    EKGs/Labs/Other Studies Reviewed:    Studies reviewed are outlined and summarized above. Reports included below if pertinent.  Monitor 12/2018 Study Highlights   Min HR 63, Max HR 123, Avg HR 87  Rare supraventricular ectopy in the form of PACs  Rare ventricular ectopy in the form of isolated PVCs  No symptoms reported  No significant arrhythmias      EKG:  EKG is ordered today, personally reviewed, demonstrating NSR 77bpm, RAE, IRBBB, nonspecific STT changes  Recent Labs: 11/30/2019: Hemoglobin 11.5; Platelets 195 12/11/2019: ALT 13; BUN 21; Creatinine 1.1; Potassium 4.2; Sodium 144 04/26/2020: TSH 0.19  Recent Lipid Panel    Component Value Date/Time   CHOL 150 12/11/2019 0000   TRIG 66 12/11/2019 0000   HDL 60 12/11/2019 0000   CHOLHDL 2.5 09/20/2016 0951   VLDL 10 09/20/2016 0951   LDLCALC 77 12/11/2019 0000    PHYSICAL EXAM:    VS:  BP 134/64   Pulse 77   Ht 5\' 2"  (1.575 m)   Wt 112 lb 12.8 oz (51.2 kg)   SpO2 97%   BMI 20.63 kg/m   BMI: Body mass index is 20.63 kg/m.  GEN: Well nourished, well developed thin WF in no acute distress HEENT: normocephalic, atraumatic Neck: no JVD, carotid bruits, or  masses Cardiac: RRR; no murmurs, rubs, or  gallops, no edema  Respiratory:  clear to auscultation bilaterally, normal work of breathing GI: soft, nontender, nondistended, + BS MS: no deformity or atrophy Skin: warm and dry, no rash Neuro:  Alert and Oriented x 3, Strength and sensation are intact, follows commands Psych: euthymic mood, full affect  Wt Readings from Last 3 Encounters:  05/20/20 112 lb 12.8 oz (51.2 kg)  04/26/20 113 lb 6.4 oz (51.4 kg)  03/09/20 116 lb 3.2 oz (52.7 kg)     ASSESSMENT & PLAN:   1. Near syncope with rare PACs/PVCs by prior monitor - her episodes on Monday/Tuesday are suspicious for arrhythmia, possibly bradycardia, but it is also confounded by diarrhea at the time. She also has a history of possible orthostasis. Orthostatics in the office today did not show any significant BP with standing. Will obtain basic labs, plan 2D Echocardiogram, and obtain a 30 day live event monitor. (I checked with the office and we do not have any live monitors in house today to place.) I advised she is not to drive until cleared by the cardiology team in follow-up. If her monitor is unrevealing, would consider referral to EP for loop recorder given the duration between prior episodes. Regarding diarrhea, this has resolved. She uses senna PRN for constipation and is aware to stop using in the future if she develops recurrent loose stools. 2. Essential HTN - would not aggressively treat at this time to a lower goal due to h/o syncope and near-syncope. Follow clinically. 3. Mild carotid artery disease - if event monitor is unrevealing, can consider updating carotid duplex although carotid disease does not typically cause syncope. No overt bruits heard on exam today. 4. Hypothyroidism - check thyroid today.  Disposition: F/u with APP in 6 weeks, sooner if monitor is abnormal.  Medication Adjustments/Labs and Tests Ordered: Current medicines are reviewed at length with the patient today.   Concerns regarding medicines are outlined above. Medication changes, Labs and Tests ordered today are summarized above and listed in the Patient Instructions accessible in Encounters.    Signed, Charlie Pitter, PA-C  05/20/2020 3:30 PM    [facility] Location in [place], [place] [address] Ph: [phone]; Fax [phone]

## 2020-05-20 ENCOUNTER — Other Ambulatory Visit: Payer: Self-pay

## 2020-05-20 ENCOUNTER — Other Ambulatory Visit (HOSPITAL_COMMUNITY)
Admission: RE | Admit: 2020-05-20 | Discharge: 2020-05-20 | Disposition: A | Discharge status: Home / Self Care | Payer: Medicare Other | Source: Ambulatory Visit | Attending: Physician Assistant | Admitting: Physician Assistant

## 2020-05-20 ENCOUNTER — Encounter: Payer: Self-pay | Admitting: Physician Assistant

## 2020-05-20 ENCOUNTER — Ambulatory Visit (INDEPENDENT_AMBULATORY_CARE_PROVIDER_SITE_OTHER): Payer: Medicare Other | Admitting: Physician Assistant

## 2020-05-20 VITALS — BP 134/64 | HR 77 | Ht 62.0 in | Wt 112.8 lb

## 2020-05-20 DIAGNOSIS — E039 Hypothyroidism, unspecified: Secondary | ICD-10-CM | POA: Diagnosis not present

## 2020-05-20 DIAGNOSIS — I10 Essential (primary) hypertension: Secondary | ICD-10-CM | POA: Diagnosis not present

## 2020-05-20 DIAGNOSIS — I493 Ventricular premature depolarization: Secondary | ICD-10-CM | POA: Diagnosis not present

## 2020-05-20 DIAGNOSIS — I491 Atrial premature depolarization: Secondary | ICD-10-CM | POA: Insufficient documentation

## 2020-05-20 DIAGNOSIS — I779 Disorder of arteries and arterioles, unspecified: Secondary | ICD-10-CM | POA: Diagnosis not present

## 2020-05-20 DIAGNOSIS — R55 Syncope and collapse: Secondary | ICD-10-CM | POA: Diagnosis not present

## 2020-05-20 DIAGNOSIS — I6529 Occlusion and stenosis of unspecified carotid artery: Secondary | ICD-10-CM | POA: Insufficient documentation

## 2020-05-20 LAB — BASIC METABOLIC PANEL
Anion gap: 9 (ref 5–15)
BUN: 29 mg/dL — ABNORMAL HIGH (ref 8–23)
CO2: 26 mmol/L (ref 22–32)
Calcium: 9 mg/dL (ref 8.9–10.3)
Chloride: 103 mmol/L (ref 98–111)
Creatinine, Ser: 1.15 mg/dL — ABNORMAL HIGH (ref 0.44–1.00)
GFR calc Af Amer: 49 mL/min — ABNORMAL LOW (ref >60)
GFR calc non Af Amer: 42 mL/min — ABNORMAL LOW (ref >60)
Glucose, Bld: 97 mg/dL (ref 70–99)
Potassium: 4.5 mmol/L (ref 3.5–5.1)
Sodium: 138 mmol/L (ref 135–145)

## 2020-05-20 LAB — CBC
HCT: 33.8 % — ABNORMAL LOW (ref 36.0–46.0)
Hemoglobin: 11 g/dL — ABNORMAL LOW (ref 12.0–15.0)
MCH: 30.9 pg (ref 26.0–34.0)
MCHC: 32.5 g/dL (ref 30.0–36.0)
MCV: 94.9 fL (ref 80.0–100.0)
Platelets: 166 10*3/uL (ref 150–400)
RBC: 3.56 MIL/uL — ABNORMAL LOW (ref 3.87–5.11)
RDW: 13.3 % (ref 11.5–15.5)
WBC: 7.2 10*3/uL (ref 4.0–10.5)
nRBC: 0 % (ref 0.0–0.2)

## 2020-05-20 LAB — MAGNESIUM: Magnesium: 2 mg/dL (ref 1.7–2.4)

## 2020-05-20 LAB — TSH: TSH: 0.317 u[IU]/mL — ABNORMAL LOW (ref 0.350–4.500)

## 2020-05-20 NOTE — Patient Instructions (Signed)
Medication Instructions:  Your physician recommends that you continue on your current medications as directed. Please refer to the Current Medication list given to you today.  *If you need a refill on your cardiac medications before your next appointment, please call your pharmacy*   Lab Work: Bmet,cbc, magnesium,tsh  If you have labs (blood work) drawn today and your tests are completely normal, you will receive your results only by: Marland Kitchen MyChart Message (if you have MyChart) OR . A paper copy in the mail If you have any lab test that is abnormal or we need to change your treatment, we will call you to review the results.   Testing/Procedures: Your physician has requested that you have an echocardiogram. Echocardiography is a painless test that uses sound waves to create images of your heart. It provides your doctor with information about the size and shape of your heart and how well your heart's chambers and valves are working. This procedure takes approximately one hour. There are no restrictions for this procedure.  Your physician has recommended that you wear an event monitor Preventice (30 day) . Event monitors are medical devices that record the heart's electrical activity. Doctors most often [place] these monitors to diagnose arrhythmias. Arrhythmias are problems with the speed or rhythm of the heartbeat. The monitor is a small, portable device. You can wear one while you do your normal daily activities. This is usually used to diagnose what is causing palpitations/syncope (passing out).     Follow-Up: At [facility], you and your health needs are our priority.  As part of our continuing mission to provide you with exceptional heart care, we have created designated Provider Care Teams.  These Care Teams include your primary Cardiologist (physician) and Advanced Practice Providers (APPs -  Physician Assistants and Nurse Practitioners) who all work together to provide you with the care you  need, when you need it.  We recommend signing up for the patient portal called "MyChart".  Sign up information is provided on this After Visit Summary.  MyChart is used to connect with patients for Virtual Visits (Telemedicine).  Patients are able to view lab/test results, encounter notes, upcoming appointments, etc.  Non-urgent messages can be sent to your provider as well.   To learn more about what you can do with MyChart, go to [email or web address].    Your next appointment:   7 week(s)  The format for your next appointment:   In Person  Provider:   Bernerd Pho, PA-C   Other Instructions Do NOT drive until cleared by your cardiology team        Thank you for choosing [facility] !

## 2020-05-20 NOTE — Telephone Encounter (Signed)
noted 

## 2020-05-23 ENCOUNTER — Telehealth: Payer: Self-pay | Admitting: Endocrinology

## 2020-05-23 NOTE — Telephone Encounter (Signed)
She should keep the appointment as scheduled

## 2020-05-23 NOTE — Telephone Encounter (Signed)
Spoke to pt told her Dr. Dwyane Dee said to keep appt as scheduled. Pt verbalize understanding and said she has been grounded and needs one of her daughter's to bring her and one is off on a Monday and the other one Tues and Thurs. Told pt she will need to call back and reschedule for sometime that week that one of her daughter's can bring her. Pt verbalized understanding.

## 2020-05-23 NOTE — Telephone Encounter (Signed)
Please let her know that this is too soon to retest her thyroid after the last change.  The results only indicate thyroid level is upper normal but better than before.  To continue same dose for now

## 2020-05-23 NOTE — Telephone Encounter (Signed)
Patient called and wanted to inform Dr that she had a thyroid test at her PCP last week and the results were low. Please advise [phone].

## 2020-05-23 NOTE — Telephone Encounter (Signed)
Please see message and advise. Labs are in [place]. Pt is scheduled to see you 06/29/2020.

## 2020-05-23 NOTE — Telephone Encounter (Signed)
Dr. Dwyane Dee, gave her the message to continue medication that she is on. Pt verbalized understanding but said that she had TSH: 0.317 done 3 days ago with Cardiology, that is why she was calling to see if medication needed to be changed. Pt also wants to know if she needs to change her appt for 06/29/2020 cause it is too soon again for labs?

## 2020-05-23 NOTE — Telephone Encounter (Signed)
Left message on voicemail to call office.  

## 2020-05-30 DIAGNOSIS — R55 Syncope and collapse: Secondary | ICD-10-CM | POA: Diagnosis not present

## 2020-05-31 ENCOUNTER — Other Ambulatory Visit: Payer: Self-pay

## 2020-05-31 ENCOUNTER — Ambulatory Visit (INDEPENDENT_AMBULATORY_CARE_PROVIDER_SITE_OTHER): Payer: Medicare Other

## 2020-05-31 ENCOUNTER — Telehealth: Payer: Self-pay | Admitting: Cardiology

## 2020-05-31 DIAGNOSIS — R55 Syncope and collapse: Secondary | ICD-10-CM

## 2020-05-31 DIAGNOSIS — H52203 Unspecified astigmatism, bilateral: Secondary | ICD-10-CM | POA: Diagnosis not present

## 2020-05-31 DIAGNOSIS — I491 Atrial premature depolarization: Secondary | ICD-10-CM

## 2020-05-31 DIAGNOSIS — I493 Ventricular premature depolarization: Secondary | ICD-10-CM | POA: Diagnosis not present

## 2020-05-31 DIAGNOSIS — H21233 Degeneration of iris (pigmentary), bilateral: Secondary | ICD-10-CM | POA: Diagnosis not present

## 2020-05-31 DIAGNOSIS — H4322 Crystalline deposits in vitreous body, left eye: Secondary | ICD-10-CM | POA: Diagnosis not present

## 2020-05-31 DIAGNOSIS — Z961 Presence of intraocular lens: Secondary | ICD-10-CM | POA: Diagnosis not present

## 2020-05-31 MED ORDER — PRAVASTATIN SODIUM 80 MG PO TABS: ORAL_TABLET | ORAL | 4 refills | Status: DC

## 2020-05-31 NOTE — Telephone Encounter (Signed)
Please send refill for pravastatin (PRAVACHOL) 80 MG tablet [id number]  To [facility]

## 2020-05-31 NOTE — Telephone Encounter (Signed)
Refill complete 

## 2020-06-03 ENCOUNTER — Other Ambulatory Visit (HOSPITAL_COMMUNITY): Payer: Medicare Other

## 2020-06-06 ENCOUNTER — Other Ambulatory Visit (HOSPITAL_COMMUNITY): Payer: Medicare Other

## 2020-06-07 ENCOUNTER — Ambulatory Visit (HOSPITAL_COMMUNITY)
Admission: RE | Admit: 2020-06-07 | Discharge: 2020-06-07 | Disposition: A | Discharge status: Home / Self Care | Payer: Medicare Other | Source: Ambulatory Visit | Attending: Physician Assistant | Admitting: Physician Assistant

## 2020-06-07 ENCOUNTER — Other Ambulatory Visit: Payer: Self-pay

## 2020-06-07 DIAGNOSIS — R55 Syncope and collapse: Secondary | ICD-10-CM | POA: Diagnosis not present

## 2020-06-07 NOTE — Progress Notes (Signed)
*  PRELIMINARY RESULTS* Echocardiogram 2D Echocardiogram has been performed.  Christine Leblanc 06/07/2020, 2:55 PM

## 2020-06-10 ENCOUNTER — Ambulatory Visit: Payer: Medicare Other | Admitting: Student

## 2020-06-15 ENCOUNTER — Ambulatory Visit: Payer: Medicare Other | Admitting: Student

## 2020-06-17 LAB — TSH: TSH: 1.38 (ref <=5.90)

## 2020-06-20 ENCOUNTER — Ambulatory Visit (HOSPITAL_COMMUNITY): Payer: Medicare Other | Admitting: Hematology

## 2020-06-21 ENCOUNTER — Other Ambulatory Visit: Payer: Self-pay | Admitting: Endocrinology

## 2020-06-29 ENCOUNTER — Other Ambulatory Visit: Payer: Self-pay

## 2020-06-29 ENCOUNTER — Ambulatory Visit (INDEPENDENT_AMBULATORY_CARE_PROVIDER_SITE_OTHER): Payer: Medicare Other | Admitting: Endocrinology

## 2020-06-29 ENCOUNTER — Encounter: Payer: Self-pay | Admitting: Endocrinology

## 2020-06-29 VITALS — BP 140/78 | HR 77 | Ht 62.0 in | Wt 112.8 lb

## 2020-06-29 DIAGNOSIS — E890 Postprocedural hypothyroidism: Secondary | ICD-10-CM

## 2020-06-29 NOTE — Progress Notes (Signed)
Christine Leblanc 86 y.o.    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  She has been followed every 6-12 months for her thyroid supplementation and monitoring of her levels However has not been seen in follow-up since 9/18  She previously had been on 100 mcg since 03/2014  This was subsequently reduced down to 75 g once daily On a previous visit because of fatigue and also her TSH being 4.7 she was told to take an extra half pill on Sundays   She now takes 75 mcg, 6-1/2 tablets a week because of persistently low TSH  She has not missed any doses Previously getting Synthroid brand from her [place] but now back on generic As before she takes Prilosec daily in the mornings also  She continues to take iron and calcium and is taking these at least 2 hours after her levothyroxine dose  TSH on her last visit was 0.19 It is now 1.4 done from outside lab  Wt Readings from Last 3 Encounters:  05/20/20 112 lb 12.8 oz (51.2 kg)  04/26/20 113 lb 6.4 oz (51.4 kg)  03/09/20 116 lb 3.2 oz (52.7 kg)   Lab Results  Component Value Date   TSH 1.38 06/17/2020   TSH 0.317 (L) 05/20/2020   TSH 0.19 (L) 04/26/2020   FREET4 1.73 (H) 04/26/2020   FREET4 1.22 09/11/2018   FREET4 1.78 (H) 09/12/2017       Allergies as of 06/29/2020      Reactions   Celecoxib Shortness Of Breath   Dexilant [dexlansoprazole] Anaphylaxis   abd pain   Famotidine    Makes her feel "very bad"      Medication List       Accurate as of June 29, 2020  2:06 PM. If you have any questions, ask your nurse or doctor.        aspirin 81 MG tablet Take 81 mg by mouth daily.   benzonatate 200 MG capsule Commonly known as: TESSALON Take 200 mg by mouth 2  (two) times daily as needed.   CALCIUM-VITAMIN D PO Take by mouth. Calcium 600 mg and Vitamin D 1000 unit. One tablet daily   diazepam 5 MG tablet Commonly known as: VALIUM Take one tablet by mouth at bedtime as needed for sleep   EPINEPHrine 0.3 mg/0.3 mL Soaj injection Commonly known as: EPI-PEN Inject into the muscle as needed for anaphylaxis.   ferrous sulfate 325 (65 FE) MG tablet Take 325 mg by mouth daily with breakfast.   ipratropium 0.03 % nasal spray Commonly known as: ATROVENT Place 2 sprays into both nostrils daily as needed (allergies stuffy nose).   levocetirizine 5 MG tablet Commonly known as: XYZAL Take 5 mg by mouth every evening.   levothyroxine 75 MCG tablet Commonly known as: Synthroid Take 1 tablet by mouth daily Monday through Saturday, and take 1/2 tablet on Sunday.   montelukast 10 MG tablet Commonly known as: SINGULAIR Take 10 mg by mouth at bedtime.   omeprazole 40 MG capsule Commonly known as: PRILOSEC TAKE 1 CAPSULE BY MOUTH 30 MINUTES PRIOR TO BREAKFAST.   pravastatin 80 MG tablet Commonly known as: PRAVACHOL TAKE 1 TABLET EVERY EVENING (NEEDS APPOINTMENT FOR REFILLS, CALL [phone])   Proventil HFA  108 (90 Base) MCG/ACT inhaler Generic drug: albuterol Inhale 1-2 puffs into the lungs every 6 (six) hours as needed for wheezing or shortness of breath. Wheezing, Asthma Symptoms   senna 8.6 MG tablet Commonly known as: SENOKOT Take 2 tablets by mouth at bedtime.   vitamin B-12 1000 MCG tablet Commonly known as: CYANOCOBALAMIN Take 1,000 mcg by mouth every morning.   VITAMIN B6 PO Take by mouth at bedtime.   vitamin C 500 MG tablet Commonly known as: ASCORBIC ACID Take 500 mg by mouth daily.   vitamin E 180 MG (400 UNITS) capsule Take 400 Units by mouth at bedtime.       Allergies:  Allergies  Allergen Reactions  . Celecoxib Shortness Of Breath  . Dexilant [Dexlansoprazole] Anaphylaxis    abd pain  . Famotidine      Makes her feel "very bad"    Past Medical History:  Diagnosis Date  . Anemia   . Asthma   . Cancer ([place])    skin Ca- ? basal cell   . Chronic back pain   . Chronic neck pain   . Degenerative joint disease    Left shoulder; cervical spine, knees & hands   . Gastroesophageal reflux disease    Hiatal hernia; distal esophageal web requiring dilatation; gastric polyps; gastritis; refuses colonoscopy  . GERD (gastroesophageal reflux disease)   . History of stress test 1990's   stress test done under the care of Dr. Lattie Haw & Dr. Gwenlyn Found, now being followed by Dr. Harl Bowie- in [place] , recently seen & told to f/U in one yr.   . Hyperlipidemia 04/27/2011  . Hypertension   . Hypothyroidism   . Lymphocytic thyroiditis   . Mild carotid artery disease ([place])   . Multiple thyroid nodules 2010   Adenomatous; thyroidectomy in 2010  . Orthostasis   . Osteoarthritis of right knee 07/27/2014  . Pneumonia    hosp. for pneumonia- long time ago   . Primary localized osteoarthrosis of right shoulder 06/21/2015  . Seizures ([place])    yes- as a child- & into adult years, states she took med. for them at one time, stopped at 32 yrs. of age   . Syncope and collapse 2007   Possible CVA in 2012 with left lower extremity weakness; refused hospitalization; CT-Atrophy and chronic microvascular ischemic change.     Past Surgical History:  Procedure Laterality Date  . ABDOMINAL HYSTERECTOMY     fibroids  . CATARACT EXTRACTION     Bilateral; redo surgery on the right for incomplete primary procedure  . COLONOSCOPY  Remote  . ESOPHAGOGASTRODUODENOSCOPY  12/10   Dr. 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 ESOPHAGOGASTRODUODENOSCOPY N/A 03/21/2015   Procedure: ESOPHAGOGASTRODUODENOSCOPY (EGD);  Surgeon: Danie Binder, MD;  [facility];  Service: Endoscopy;  Laterality: N/A;  1130   . EYE SURGERY    . PROLAPSED UTERINE FIBROID LIGATION      outcomed with rectocele & cystocele  . SAVORY DILATION N/A 03/21/2015   Procedure: SAVORY DILATION;  Surgeon: Danie Binder, MD;  [facility];  Service: Endoscopy;  Laterality: N/A;  . SHOULDER SURGERY     left  . TOTAL KNEE ARTHROPLASTY Right 07/27/2014   Procedure: RIGHT TOTAL KNEE ARTHROPLASTY;  Surgeon: Johnny Bridge, MD;  Location: [facility];  Service: Orthopedics;  Laterality: Right;  . TOTAL SHOULDER ARTHROPLASTY Right 06/21/2015   Procedure: RIGHT TOTAL SHOULDER ARTHROPLASTY;  Surgeon: Marchia Bond, MD;  Location: [facility];  Service: Orthopedics;  Laterality: Right;  . TOTAL THYROIDECTOMY  2010    Family History  Problem Relation Age of Onset  . Heart disease Mother   . Gallbladder disease Mother   . Heart failure Mother   . Diabetes Mother   . Breast cancer Sister        30 years ago  . Diabetes Sister   . Heart attack Father   . Diabetes Maternal Grandmother   . Colon cancer Neg Hx   . Stroke Neg Hx     Social History:  reports that she quit smoking about 41 years ago. Her smoking use included cigarettes. She has a 16.00 pack-year smoking history. She has never used smokeless tobacco. She reports that she does not drink alcohol and does not use drugs.  REVIEW Of SYSTEMS:  ROS  Weight History:  Wt Readings from Last 3 Encounters:  05/20/20 112 lb 12.8 oz (51.2 kg)  04/26/20 113 lb 6.4 oz (51.4 kg)  03/09/20 116 lb 3.2 oz (52.7 kg)    History of hypercholesterolemia followed by PCP, treated with pravastatin 80 mg   She has chronic difficulty swallowing  She is being evaluated now from cardiology for episodes of near syncope   Examination:   BP 140/78 (BP Location: Left Arm, Patient Position: Sitting)   Pulse 77   Ht 5\' 2"  (1.575 m)   SpO2 98%   BMI 20.63 kg/m        Assessments   Hypothyroidism, postsurgical and long-standing  She has been taking 75 mcg levothyroxine More recently she has had variable TSH levels Also her symptoms are  difficult to assess as these are nonspecific  Her pharmacy has switched her from generic to brand name Synthroid and back to the generic now Currently however she is requiring slightly lower doses of levothyroxine and with 6-1/2 tablets a week of the 75 mcg her TSH is back to normal She is quite consistent with taking her levothyroxine on empty stomach as directed without any interacting supplements  She will stay on the same regimen and follow-up in 6 months     There are no Patient Instructions on file for this visit.   Elayne Snare 06/29/2020, 2:06 PM     Addendum: TSH is 0.19, she will continue brand-name Synthroid but take only 6-1/2 tablets a week now

## 2020-07-05 ENCOUNTER — Telehealth: Payer: Self-pay | Admitting: Internal Medicine

## 2020-07-05 NOTE — Telephone Encounter (Signed)
Called pt verified name and dob Pt requested a refill on a medication to [facility] Notified pt to contact her pharmacy and have them fax over a refill request form Pt voiced understanding and thanked me for the call

## 2020-07-05 NOTE — Telephone Encounter (Signed)
[phone] please call patient about her medication    Uses [facility]

## 2020-07-07 ENCOUNTER — Encounter: Payer: Self-pay | Admitting: Student

## 2020-07-07 ENCOUNTER — Ambulatory Visit (INDEPENDENT_AMBULATORY_CARE_PROVIDER_SITE_OTHER): Payer: Medicare Other | Admitting: Student

## 2020-07-07 ENCOUNTER — Other Ambulatory Visit: Payer: Self-pay

## 2020-07-07 VITALS — BP 132/60 | HR 86 | Ht 62.0 in | Wt 118.0 lb

## 2020-07-07 DIAGNOSIS — E039 Hypothyroidism, unspecified: Secondary | ICD-10-CM

## 2020-07-07 DIAGNOSIS — N183 Chronic kidney disease, stage 3 unspecified: Secondary | ICD-10-CM | POA: Diagnosis not present

## 2020-07-07 DIAGNOSIS — R55 Syncope and collapse: Secondary | ICD-10-CM | POA: Diagnosis not present

## 2020-07-07 DIAGNOSIS — E782 Mixed hyperlipidemia: Secondary | ICD-10-CM | POA: Diagnosis not present

## 2020-07-07 MED ORDER — PRAVASTATIN SODIUM 40 MG PO TABS: 80.0000 mg | ORAL_TABLET | Freq: Every day | ORAL | 3 refills | Status: DC

## 2020-07-07 NOTE — Patient Instructions (Signed)
Medication Instructions:  Your physician recommends that you continue on your current medications as directed. Please refer to the Current Medication list given to you today.  *If you need a refill on your cardiac medications before your next appointment, please call your pharmacy*   Lab Work: NONE   If you have labs (blood work) drawn today and your tests are completely normal, you will receive your results only by: . MyChart Message (if you have MyChart) OR . A paper copy in the mail If you have any lab test that is abnormal or we need to change your treatment, we will call you to review the results.   Testing/Procedures: NONE    Follow-Up: At [facility], you and your health needs are our priority.  As part of our continuing mission to provide you with exceptional heart care, we have created designated Provider Care Teams.  These Care Teams include your primary Cardiologist (physician) and Advanced Practice Providers (APPs -  Physician Assistants and Nurse Practitioners) who all work together to provide you with the care you need, when you need it.  We recommend signing up for the patient portal called "MyChart".  Sign up information is provided on this After Visit Summary.  MyChart is used to connect with patients for Virtual Visits (Telemedicine).  Patients are able to view lab/test results, encounter notes, upcoming appointments, etc.  Non-urgent messages can be sent to your provider as well.   To learn more about what you can do with MyChart, go to [email or web address].    Your next appointment:   1 year(s)  The format for your next appointment:   In Person  Provider:   Jonathan Branch, MD   Other Instructions Thank you for choosing [facility]!    

## 2020-07-07 NOTE — Progress Notes (Signed)
Cardiology Office Note    Date:  07/07/2020   ID:  KERIN KREN, DOB 1929/12/14, MRN [id number]  PCP:  Celene Squibb, MD  Cardiologist: Carlyle Dolly, MD    Chief Complaint  Patient presents with  . Follow-up    to review echo and monitor    History of Present Illness:    Christine Leblanc is a 86 y.o. female with past medical history of HTN, HLD, Hypothyroidism, palpitations (PAC's and PVC's by prior monitor), Stage III CKD, GERD and hiatal hernia who presents to the office today for 6-week follow-up.  She was last examined by Melina Copa, PA-C on 05/20/2020 and reported episodes of near syncope. She reported having issues with diarrhea in the interim and developed abdominal discomfort and fecal urgency with associated presyncope at that time. She did go to the restroom and did not have an actual syncopal episode. She reported similar symptoms the day after with presyncope but denied any actual syncopal events. Orthostatic vitals were checked in the office that day and negative. An echocardiogram along with cardiac event monitor were recommended to assess for any significant arrhythmias. Routine labs were also obtained and showed her hemoglobin was overall stable at 11.0. Electrolytes were within a normal range and creatinine was similar to prior values at 1.15. TSH was low at 0.317 and she was informed to follow-up with her PCP in regards to this. Her echocardiogram showed a preserved EF of 70 to 75% with hyperdynamic function and no regional wall motion abnormalities. She did have mild LVH, trivial MR and mild aortic valve sclerosis without stenosis.  While her event monitor has not officially resulted, the EOS is available for review and shows normal sinus rhythm with no significant arrhythmias or pauses. Average HR was 84 bpm.   In talking with the patient and her son today, she reports overall doing well from a cardiac perspective since her last visit. She denies any recurrent presyncopal  episodes. She questions if she was possibly dehydrated when she had her prior events as she had gone to the grocery store earlier in the day in the warm weather and had walked outside in her yard. She remains very active at baseline for her age and performs routine chores around the house and also enjoys planting flowers. She denies any recent dyspnea on exertion, chest pain, palpitations, orthopnea, PND or lower extremity edema.  She was previously on medications for hypertension but says these were discontinued a few years back given episodes of hypotension.  Past Medical History:  Diagnosis Date  . Anemia   . Asthma   . Cancer ([place])    skin Ca- ? basal cell   . Chronic back pain   . Chronic neck pain   . Degenerative joint disease    Left shoulder; cervical spine, knees & hands   . Gastroesophageal reflux disease    Hiatal hernia; distal esophageal web requiring dilatation; gastric polyps; gastritis; refuses colonoscopy  . GERD (gastroesophageal reflux disease)   . History of stress test 1990's   stress test done under the care of Dr. Lattie Haw & Dr. Gwenlyn Found, now being followed by Dr. Harl Bowie- in [place] , recently seen & told to f/U in one yr.   . Hyperlipidemia 04/27/2011  . Hypertension   . Hypothyroidism   . Lymphocytic thyroiditis   . Mild carotid artery disease ([place])   . Multiple thyroid nodules 2010   Adenomatous; thyroidectomy in 2010  . Orthostasis   . Osteoarthritis of  right knee 07/27/2014  . Pneumonia    hosp. for pneumonia- long time ago   . Primary localized osteoarthrosis of right shoulder 06/21/2015  . Seizures ([place])    yes- as a child- & into adult years, states she took med. for them at one time, stopped at 32 yrs. of age   . Syncope and collapse 2007   Possible CVA in 2012 with left lower extremity weakness; refused hospitalization; CT-Atrophy and chronic microvascular ischemic change.     Past Surgical History:  Procedure Laterality Date  . ABDOMINAL  HYSTERECTOMY     fibroids  . CATARACT EXTRACTION     Bilateral; redo surgery on the right for incomplete primary procedure  . COLONOSCOPY  Remote  . ESOPHAGOGASTRODUODENOSCOPY  12/10   Dr. 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 ESOPHAGOGASTRODUODENOSCOPY N/A 03/21/2015   Procedure: ESOPHAGOGASTRODUODENOSCOPY (EGD);  Surgeon: Danie Binder, MD;  [facility];  Service: Endoscopy;  Laterality: N/A;  1130   . EYE SURGERY    . PROLAPSED UTERINE FIBROID LIGATION     outcomed with rectocele & cystocele  . SAVORY DILATION N/A 03/21/2015   Procedure: SAVORY DILATION;  Surgeon: Danie Binder, MD;  [facility];  Service: Endoscopy;  Laterality: N/A;  . SHOULDER SURGERY     left  . TOTAL KNEE ARTHROPLASTY Right 07/27/2014   Procedure: RIGHT TOTAL KNEE ARTHROPLASTY;  Surgeon: Johnny Bridge, MD;  Location: [place] [facility];  Service: Orthopedics;  Laterality: Right;  . TOTAL SHOULDER ARTHROPLASTY Right 06/21/2015   Procedure: RIGHT TOTAL SHOULDER ARTHROPLASTY;  Surgeon: Marchia Bond, MD;  Location: [facility];  Service: Orthopedics;  Laterality: Right;  . TOTAL THYROIDECTOMY  2010    Current Medications: Outpatient Medications Prior to Visit  Medication Sig Dispense Refill  . albuterol (PROVENTIL HFA) 108 (90 BASE) MCG/ACT inhaler Inhale 1-2 puffs into the lungs every 6 (six) hours as needed for wheezing or shortness of breath. Wheezing, Asthma Symptoms    . CALCIUM-VITAMIN D PO Take by mouth. Calcium 600 mg and Vitamin D 1000 unit. One tablet daily    . diazepam (VALIUM) 5 MG tablet Take one tablet by mouth at bedtime as needed for sleep 30 tablet 5  . EPINEPHrine 0.3 mg/0.3 mL IJ SOAJ injection Inject into the muscle as needed for anaphylaxis.    . ferrous sulfate 325 (65 FE) MG tablet Take 325 mg by mouth daily with breakfast.    . ipratropium (ATROVENT) 0.03 % nasal spray Place 2 sprays into both nostrils daily as needed (allergies  stuffy nose).     Marland Kitchen levocetirizine (XYZAL) 5 MG tablet Take 5 mg by mouth every evening.      Marland Kitchen levothyroxine (SYNTHROID) 75 MCG tablet Take 1 tablet by mouth daily Monday through Saturday, and take 1/2 tablet on Sunday. 78 tablet 0  . montelukast (SINGULAIR) 10 MG tablet Take 10 mg by mouth at bedtime.    Marland Kitchen omeprazole (PRILOSEC) 40 MG capsule TAKE 1 CAPSULE BY MOUTH 30 MINUTES PRIOR TO BREAKFAST. 90 capsule 3  . Pyridoxine HCl (VITAMIN B6 PO) Take by mouth at bedtime.    . senna (SENOKOT) 8.6 MG tablet Take 2 tablets by mouth at bedtime.     . vitamin B-12 (CYANOCOBALAMIN) 1000 MCG tablet Take 1,000 mcg by mouth every morning.    . vitamin C (ASCORBIC ACID) 500 MG tablet Take 500 mg by mouth daily.    . vitamin E 400 UNIT capsule  Take 400 Units by mouth at bedtime.     . pravastatin (PRAVACHOL) 80 MG tablet TAKE 1 TABLET EVERY EVENING (NEEDS APPOINTMENT FOR REFILLS, CALL [phone]) 90 tablet 4  . aspirin 81 MG tablet Take 81 mg by mouth daily.    . benzonatate (TESSALON) 200 MG capsule Take 200 mg by mouth 2 (two) times daily as needed.      No facility-administered medications prior to visit.     Allergies:   Celecoxib, Dexilant [dexlansoprazole], and Famotidine   Social History   Socioeconomic History  . Marital status: Widowed    Spouse name: Not on file  . Number of children: 4  . Years of education: 42  . Highest education level: Not on file  Occupational History  . 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 Occupation: Retired    Comment: Marine scientist  Tobacco Use  . Smoking status: Former Smoker    Packs/day: 0.80    Years: 20.00    Pack years: 16.00    Types: Cigarettes    Quit date: 12/31/1978    Years since quitting: 41.5  . Smokeless tobacco: Never Used  Vaping Use  . Vaping Use: Never used  Substance and Sexual Activity  . Alcohol use: No  . Drug use: No  . Sexual activity: Never    Birth control/protection: Surgical    Comment: widowed  since 2010  Other Topics Concern  . Not on file  Social History Narrative   Lives at home. Her daughter lives with her.    Caffeine use: 1 cup coffee per day   Social Determinants of Health   Financial Resource Strain:   . Difficulty of Paying Living Expenses:   Food Insecurity:   . Worried About Charity fundraiser in the Last Year:   . Arboriculturist in the Last Year:   Transportation Needs:   . Film/video editor (Medical):   Marland Kitchen Lack of Transportation (Non-Medical):   Physical Activity:   . Days of Exercise per Week:   . Minutes of Exercise per Session:   Stress:   . Feeling of Stress :   Social Connections:   . Frequency of Communication with Friends and Family:   . Frequency of Social Gatherings with Friends and Family:   . Attends Religious Services:   . Active Member of Clubs or Organizations:   . Attends Archivist Meetings:   Marland Kitchen Marital Status:      Family History:  The patient's family history includes Breast cancer in her sister; Diabetes in her maternal grandmother, mother, and sister; Gallbladder disease in her mother; Heart attack in her father; Heart disease in her mother; Heart failure in her mother.   Review of Systems:   Please see the history of present illness.     General:  No chills, fever, night sweats or weight changes. Positive for presyncope (no recurrence).  Cardiovascular:  No chest pain, dyspnea on exertion, edema, orthopnea, palpitations, paroxysmal nocturnal dyspnea. Dermatological: No rash, lesions/masses Respiratory: No cough, dyspnea Urologic: No hematuria, dysuria Abdominal:   No nausea, vomiting, diarrhea, bright red blood per rectum, melena, or hematemesis Neurologic:  No visual changes, wkns, changes in mental status. All other systems reviewed and are otherwise negative except as noted above.   Physical Exam:    VS:  BP 132/60   Pulse 86   Ht 5\' 2"  (1.575 m)   Wt 118 lb (53.5 kg)   SpO2  96%   BMI 21.58 kg/m      General: Well developed, well nourished elderly female appearing in no acute distress. Head: Normocephalic, atraumatic, sclera non-icteric.  Neck: No carotid bruits. JVD not elevated.  Lungs: Respirations regular and unlabored, without wheezes or rales.  Heart: Regular rate and rhythm. No S3 or S4.  No murmur, no rubs, or gallops appreciated. Abdomen: Soft, non-tender, non-distended. No obvious abdominal masses. Msk:  Strength and tone appear normal for age. No obvious joint deformities or effusions. Extremities: No clubbing or cyanosis. No lower extremity edema. Varicose veins noted.  Distal pedal pulses are 2+ bilaterally. Neuro: Alert and oriented X 3. Moves all extremities spontaneously. No focal deficits noted. Psych:  Responds to questions appropriately with a normal affect. Skin: No rashes or lesions noted  Wt Readings from Last 3 Encounters:  07/07/20 118 lb (53.5 kg)  06/29/20 112 lb 12.8 oz (51.2 kg)  05/20/20 112 lb 12.8 oz (51.2 kg)     Studies/Labs Reviewed:   EKG:  EKG is not ordered today.    Recent Labs: 12/11/2019: ALT 13 05/20/2020: BUN 29; Creatinine, Ser 1.15; Hemoglobin 11.0; Magnesium 2.0; Platelets 166; Potassium 4.5; Sodium 138 06/17/2020: TSH 1.38   Lipid Panel    Component Value Date/Time   CHOL 150 12/11/2019 0000   TRIG 66 12/11/2019 0000   HDL 60 12/11/2019 0000   CHOLHDL 2.5 09/20/2016 0951   VLDL 10 09/20/2016 0951   LDLCALC 77 12/11/2019 0000    Additional studies/ records that were reviewed today include:   Echocardiogram: 05/2020 IMPRESSIONS    1. Left ventricular ejection fraction, by estimation, is 70 to 75%. The  left ventricle has hyperdynamic function. The left ventricle has no  regional wall motion abnormalities. There is mild left ventricular  hypertrophy. Left ventricular diastolic  parameters are indeterminate.  2. Right ventricular systolic function is normal. The right ventricular  size is normal. There is normal  pulmonary artery systolic pressure. The  estimated right ventricular systolic pressure is 92.1 mmHg.  3. The mitral valve is grossly normal. Trivial mitral valve  regurgitation.  4. The aortic valve is tricuspid. Aortic valve regurgitation is not  visualized. Mild aortic valve sclerosis is present, with no evidence of  aortic valve stenosis.  5. The inferior vena cava is normal in size with greater than 50%  respiratory variability, suggesting right atrial pressure of 3 mmHg.   Assessment:    1. Near syncope   2. Mixed hyperlipidemia   3. Hypothyroidism, unspecified type   4. Stage 3 chronic kidney disease, unspecified whether stage 3a or 3b CKD      Plan:   In order of problems listed above:  1. Presyncope - She denies any recurrent episodes since her last visit. She tells me today she had been more active outside around the time her symptoms previously occurred and questions if dehydration placed a role, especially in the setting of her GI issues at that time as well.  - Echocardiogram showed a preserved EF of 70 to 75% with no significant valve abnormalities and the preliminary report of her event monitor shows NSR with no significant arrhythmias or pauses. The possibility of an ILR had been reviewed at the time of her last visit but given no recurrent episodes and no associated cardiac symptoms at that time, will not pursue further testing currently. We reviewed this could be considered if she develops recurrent presyncope. The importance of adequate hydration was reviewed and her family has encouraged  her to only work in the yard during the early morning hours or evening during the summer months.   2. HLD - FLP in 12/2019 showed total cholesterol 150, triglycerides 66, HDL 60 and LDL 77. She remains on Pravastatin 80mg  daily. She does report difficulty with taking the 80mg  tablets and asks to have 40mg  tablets sent to her pharmacy in place of this.   3. Hypothyroidism -  Followed by Endocrinology. She reports they recently adjusted her Synthroid dosing and TSH has normalized by repeat labs.    4. Stage 3 CKD - Creatinine was stable at 1.15 in 04/2020 which is close to her baseline.    Medication Adjustments/Labs and Tests Ordered: Current medicines are reviewed at length with the patient today.  Concerns regarding medicines are outlined above.  Medication changes, Labs and Tests ordered today are listed in the Patient Instructions below. Patient Instructions  Medication Instructions:  Your physician recommends that you continue on your current medications as directed. Please refer to the Current Medication list given to you today.  *If you need a refill on your cardiac medications before your next appointment, please call your pharmacy*   Lab Work: NONE   If you have labs (blood work) drawn today and your tests are completely normal, you will receive your results only by: Marland Kitchen MyChart Message (if you have MyChart) OR . A paper copy in the mail If you have any lab test that is abnormal or we need to change your treatment, we will call you to review the results.   Testing/Procedures: NONE    Follow-Up: At [facility], you and your health needs are our priority.  As part of our continuing mission to provide you with exceptional heart care, we have created designated Provider Care Teams.  These Care Teams include your primary Cardiologist (physician) and Advanced Practice Providers (APPs -  Physician Assistants and Nurse Practitioners) who all work together to provide you with the care you need, when you need it.  We recommend signing up for the patient portal called "MyChart".  Sign up information is provided on this After Visit Summary.  MyChart is used to connect with patients for Virtual Visits (Telemedicine).  Patients are able to view lab/test results, encounter notes, upcoming appointments, etc.  Non-urgent messages can be sent to your provider as  well.   To learn more about what you can do with MyChart, go to [email or web address].    Your next appointment:   1 year(s)  The format for your next appointment:   In Person  Provider:   Carlyle Dolly, MD   Other Instructions Thank you for choosing [facility]!    Signed, Erma Heritage, PA-C  07/07/2020 8:06 PM    [facility] [address] [place], [place] [address] Phone: [phone] Fax: [phone]

## 2020-07-08 ENCOUNTER — Ambulatory Visit: Payer: Medicare Other | Admitting: Student

## 2020-07-25 DIAGNOSIS — J302 Other seasonal allergic rhinitis: Secondary | ICD-10-CM | POA: Diagnosis not present

## 2020-07-25 DIAGNOSIS — K219 Gastro-esophageal reflux disease without esophagitis: Secondary | ICD-10-CM | POA: Diagnosis not present

## 2020-07-25 DIAGNOSIS — E785 Hyperlipidemia, unspecified: Secondary | ICD-10-CM | POA: Diagnosis not present

## 2020-07-25 DIAGNOSIS — Z516 Encounter for desensitization to allergens: Secondary | ICD-10-CM | POA: Diagnosis not present

## 2020-07-25 DIAGNOSIS — E039 Hypothyroidism, unspecified: Secondary | ICD-10-CM | POA: Diagnosis not present

## 2020-07-25 DIAGNOSIS — F411 Generalized anxiety disorder: Secondary | ICD-10-CM | POA: Diagnosis not present

## 2020-07-25 DIAGNOSIS — N390 Urinary tract infection, site not specified: Secondary | ICD-10-CM | POA: Diagnosis not present

## 2020-07-25 DIAGNOSIS — I10 Essential (primary) hypertension: Secondary | ICD-10-CM | POA: Diagnosis not present

## 2020-07-25 DIAGNOSIS — D638 Anemia in other chronic diseases classified elsewhere: Secondary | ICD-10-CM | POA: Diagnosis not present

## 2020-07-25 DIAGNOSIS — D631 Anemia in chronic kidney disease: Secondary | ICD-10-CM | POA: Diagnosis not present

## 2020-07-25 DIAGNOSIS — E782 Mixed hyperlipidemia: Secondary | ICD-10-CM | POA: Diagnosis not present

## 2020-07-25 DIAGNOSIS — D519 Vitamin B12 deficiency anemia, unspecified: Secondary | ICD-10-CM | POA: Diagnosis not present

## 2020-07-25 DIAGNOSIS — Z6821 Body mass index (BMI) 21.0-21.9, adult: Secondary | ICD-10-CM | POA: Diagnosis not present

## 2020-08-03 DIAGNOSIS — J3089 Other allergic rhinitis: Secondary | ICD-10-CM | POA: Diagnosis not present

## 2020-08-03 DIAGNOSIS — J300 Vasomotor rhinitis: Secondary | ICD-10-CM | POA: Diagnosis not present

## 2020-08-03 DIAGNOSIS — K219 Gastro-esophageal reflux disease without esophagitis: Secondary | ICD-10-CM | POA: Diagnosis not present

## 2020-08-03 DIAGNOSIS — J4530 Mild persistent asthma, uncomplicated: Secondary | ICD-10-CM | POA: Diagnosis not present

## 2020-08-11 DIAGNOSIS — J3089 Other allergic rhinitis: Secondary | ICD-10-CM | POA: Diagnosis not present

## 2020-08-11 DIAGNOSIS — J301 Allergic rhinitis due to pollen: Secondary | ICD-10-CM | POA: Diagnosis not present

## 2020-08-24 DIAGNOSIS — J302 Other seasonal allergic rhinitis: Secondary | ICD-10-CM | POA: Diagnosis not present

## 2020-08-24 DIAGNOSIS — Z516 Encounter for desensitization to allergens: Secondary | ICD-10-CM | POA: Diagnosis not present

## 2020-08-30 ENCOUNTER — Other Ambulatory Visit: Payer: Self-pay | Admitting: Endocrinology

## 2020-09-02 DIAGNOSIS — Z516 Encounter for desensitization to allergens: Secondary | ICD-10-CM | POA: Diagnosis not present

## 2020-09-02 DIAGNOSIS — J302 Other seasonal allergic rhinitis: Secondary | ICD-10-CM | POA: Diagnosis not present

## 2020-09-08 ENCOUNTER — Telehealth: Payer: Self-pay | Admitting: Student

## 2020-09-08 NOTE — Telephone Encounter (Signed)
Monitor results given to patient

## 2020-09-08 NOTE — Telephone Encounter (Signed)
PT WANTS TO KNOW IF SHE HAS LABS DUE    PLEASE CALL [phone]   [facility]

## 2020-09-09 DIAGNOSIS — Z516 Encounter for desensitization to allergens: Secondary | ICD-10-CM | POA: Diagnosis not present

## 2020-09-09 DIAGNOSIS — J302 Other seasonal allergic rhinitis: Secondary | ICD-10-CM | POA: Diagnosis not present

## 2020-09-12 DIAGNOSIS — Z6821 Body mass index (BMI) 21.0-21.9, adult: Secondary | ICD-10-CM | POA: Diagnosis not present

## 2020-09-12 DIAGNOSIS — Z1231 Encounter for screening mammogram for malignant neoplasm of breast: Secondary | ICD-10-CM | POA: Diagnosis not present

## 2020-09-12 DIAGNOSIS — Z01419 Encounter for gynecological examination (general) (routine) without abnormal findings: Secondary | ICD-10-CM | POA: Diagnosis not present

## 2020-09-16 DIAGNOSIS — Z516 Encounter for desensitization to allergens: Secondary | ICD-10-CM | POA: Diagnosis not present

## 2020-09-16 DIAGNOSIS — J302 Other seasonal allergic rhinitis: Secondary | ICD-10-CM | POA: Diagnosis not present

## 2020-09-22 DIAGNOSIS — J302 Other seasonal allergic rhinitis: Secondary | ICD-10-CM | POA: Diagnosis not present

## 2020-09-22 DIAGNOSIS — Z516 Encounter for desensitization to allergens: Secondary | ICD-10-CM | POA: Diagnosis not present

## 2020-09-23 DIAGNOSIS — H903 Sensorineural hearing loss, bilateral: Secondary | ICD-10-CM | POA: Diagnosis not present

## 2020-09-23 DIAGNOSIS — H838X3 Other specified diseases of inner ear, bilateral: Secondary | ICD-10-CM | POA: Diagnosis not present

## 2020-10-10 ENCOUNTER — Telehealth: Payer: Self-pay

## 2020-10-10 MED ORDER — LEVOTHYROXINE SODIUM 75 MCG PO TABS: ORAL_TABLET | ORAL | 3 refills | Status: DC

## 2020-10-10 NOTE — Telephone Encounter (Signed)
New message    1. Which medications need to be refilled? (please list name of each medication and dose if known) levothyroxine (SYNTHROID) 75 MCG tablet  2. Which pharmacy/location (including street and city if local pharmacy) is medication to [facility] sent to?[facility]

## 2020-10-13 DIAGNOSIS — J302 Other seasonal allergic rhinitis: Secondary | ICD-10-CM | POA: Diagnosis not present

## 2020-10-13 DIAGNOSIS — Z516 Encounter for desensitization to allergens: Secondary | ICD-10-CM | POA: Diagnosis not present

## 2020-10-25 ENCOUNTER — Ambulatory Visit: Payer: Medicare Other | Admitting: Endocrinology

## 2020-11-04 DIAGNOSIS — Z516 Encounter for desensitization to allergens: Secondary | ICD-10-CM | POA: Diagnosis not present

## 2020-11-04 DIAGNOSIS — J302 Other seasonal allergic rhinitis: Secondary | ICD-10-CM | POA: Diagnosis not present

## 2020-11-29 DIAGNOSIS — J302 Other seasonal allergic rhinitis: Secondary | ICD-10-CM | POA: Diagnosis not present

## 2020-11-29 DIAGNOSIS — Z516 Encounter for desensitization to allergens: Secondary | ICD-10-CM | POA: Diagnosis not present

## 2020-12-02 ENCOUNTER — Other Ambulatory Visit (HOSPITAL_COMMUNITY): Payer: Self-pay

## 2020-12-02 DIAGNOSIS — D472 Monoclonal gammopathy: Secondary | ICD-10-CM

## 2020-12-05 ENCOUNTER — Inpatient Hospital Stay (HOSPITAL_COMMUNITY): Payer: Medicare Other | Attending: Hematology and Oncology

## 2020-12-05 ENCOUNTER — Other Ambulatory Visit: Payer: Self-pay

## 2020-12-05 DIAGNOSIS — K219 Gastro-esophageal reflux disease without esophagitis: Secondary | ICD-10-CM | POA: Diagnosis not present

## 2020-12-05 DIAGNOSIS — Z791 Long term (current) use of non-steroidal anti-inflammatories (NSAID): Secondary | ICD-10-CM | POA: Diagnosis not present

## 2020-12-05 DIAGNOSIS — I10 Essential (primary) hypertension: Secondary | ICD-10-CM | POA: Diagnosis not present

## 2020-12-05 DIAGNOSIS — D472 Monoclonal gammopathy: Secondary | ICD-10-CM | POA: Insufficient documentation

## 2020-12-05 DIAGNOSIS — J45909 Unspecified asthma, uncomplicated: Secondary | ICD-10-CM | POA: Insufficient documentation

## 2020-12-05 DIAGNOSIS — M2550 Pain in unspecified joint: Secondary | ICD-10-CM | POA: Insufficient documentation

## 2020-12-05 DIAGNOSIS — Z7982 Long term (current) use of aspirin: Secondary | ICD-10-CM | POA: Diagnosis not present

## 2020-12-05 DIAGNOSIS — Z8249 Family history of ischemic heart disease and other diseases of the circulatory system: Secondary | ICD-10-CM | POA: Diagnosis not present

## 2020-12-05 DIAGNOSIS — Z803 Family history of malignant neoplasm of breast: Secondary | ICD-10-CM | POA: Diagnosis not present

## 2020-12-05 DIAGNOSIS — Z79899 Other long term (current) drug therapy: Secondary | ICD-10-CM | POA: Insufficient documentation

## 2020-12-05 DIAGNOSIS — E785 Hyperlipidemia, unspecified: Secondary | ICD-10-CM | POA: Insufficient documentation

## 2020-12-05 LAB — CBC WITH DIFFERENTIAL/PLATELET
Abs Immature Granulocytes: 0.01 10*3/uL (ref 0.00–0.07)
Basophils Absolute: 0.1 10*3/uL (ref 0.0–0.1)
Basophils Relative: 1 %
Eosinophils Absolute: 0.6 10*3/uL — ABNORMAL HIGH (ref 0.0–0.5)
Eosinophils Relative: 11 %
HCT: 34 % — ABNORMAL LOW (ref 36.0–46.0)
Hemoglobin: 10.9 g/dL — ABNORMAL LOW (ref 12.0–15.0)
Immature Granulocytes: 0 %
Lymphocytes Relative: 20 %
Lymphs Abs: 1.1 10*3/uL (ref 0.7–4.0)
MCH: 30.5 pg (ref 26.0–34.0)
MCHC: 32.1 g/dL (ref 30.0–36.0)
MCV: 95.2 fL (ref 80.0–100.0)
Monocytes Absolute: 0.5 10*3/uL (ref 0.1–1.0)
Monocytes Relative: 9 %
Neutro Abs: 3.3 10*3/uL (ref 1.7–7.7)
Neutrophils Relative %: 59 %
Platelets: 193 10*3/uL (ref 150–400)
RBC: 3.57 MIL/uL — ABNORMAL LOW (ref 3.87–5.11)
RDW: 13.3 % (ref 11.5–15.5)
WBC: 5.7 10*3/uL (ref 4.0–10.5)
nRBC: 0 % (ref 0.0–0.2)

## 2020-12-05 LAB — COMPREHENSIVE METABOLIC PANEL
ALT: 15 U/L (ref 0–44)
AST: 23 U/L (ref 15–41)
Albumin: 3.5 g/dL (ref 3.5–5.0)
Alkaline Phosphatase: 49 U/L (ref 38–126)
Anion gap: 9 (ref 5–15)
BUN: 21 mg/dL (ref 8–23)
CO2: 27 mmol/L (ref 22–32)
Calcium: 8.9 mg/dL (ref 8.9–10.3)
Chloride: 104 mmol/L (ref 98–111)
Creatinine, Ser: 1.2 mg/dL — ABNORMAL HIGH (ref 0.44–1.00)
GFR, Estimated: 43 mL/min — ABNORMAL LOW (ref >60)
Glucose, Bld: 96 mg/dL (ref 70–99)
Potassium: 3.6 mmol/L (ref 3.5–5.1)
Sodium: 140 mmol/L (ref 135–145)
Total Bilirubin: 0.5 mg/dL (ref 0.3–1.2)
Total Protein: 6.9 g/dL (ref 6.5–8.1)

## 2020-12-05 LAB — LACTATE DEHYDROGENASE: LDH: 130 U/L (ref 98–192)

## 2020-12-06 LAB — KAPPA/LAMBDA LIGHT CHAINS
Kappa free light chain: 59.9 mg/L — ABNORMAL HIGH (ref 3.3–19.4)
Kappa, lambda light chain ratio: 2.22 — ABNORMAL HIGH (ref 0.26–1.65)
Lambda free light chains: 27 mg/L — ABNORMAL HIGH (ref 5.7–26.3)

## 2020-12-06 LAB — IGG, IGA, IGM
IgA: 340 mg/dL (ref 64–422)
IgG (Immunoglobin G), Serum: 1118 mg/dL (ref 586–1602)
IgM (Immunoglobulin M), Srm: 153 mg/dL (ref 26–217)

## 2020-12-07 LAB — PROTEIN ELECTROPHORESIS, SERUM
A/G Ratio: 1.1 (ref 0.7–1.7)
Albumin ELP: 3.4 g/dL (ref 2.9–4.4)
Alpha-1-Globulin: 0.2 g/dL (ref 0.0–0.4)
Alpha-2-Globulin: 0.8 g/dL (ref 0.4–1.0)
Beta Globulin: 0.8 g/dL (ref 0.7–1.3)
Gamma Globulin: 1.2 g/dL (ref 0.4–1.8)
Globulin, Total: 3.1 g/dL (ref 2.2–3.9)
M-Spike, %: 0.2 g/dL — ABNORMAL HIGH
Total Protein ELP: 6.5 g/dL (ref 6.0–8.5)

## 2020-12-12 ENCOUNTER — Ambulatory Visit (HOSPITAL_COMMUNITY): Payer: Medicare Other | Admitting: Hematology

## 2020-12-13 ENCOUNTER — Inpatient Hospital Stay (HOSPITAL_COMMUNITY): Payer: Medicare Other | Admitting: Hematology

## 2020-12-27 DIAGNOSIS — J309 Allergic rhinitis, unspecified: Secondary | ICD-10-CM | POA: Diagnosis not present

## 2020-12-27 DIAGNOSIS — Z516 Encounter for desensitization to allergens: Secondary | ICD-10-CM | POA: Diagnosis not present

## 2021-01-03 ENCOUNTER — Ambulatory Visit (HOSPITAL_COMMUNITY): Payer: Medicare Other | Admitting: Oncology

## 2021-01-03 ENCOUNTER — Ambulatory Visit: Payer: Medicare Other | Admitting: Endocrinology

## 2021-01-10 ENCOUNTER — Inpatient Hospital Stay (HOSPITAL_COMMUNITY): Payer: Medicare Other | Attending: Hematology and Oncology | Admitting: Oncology

## 2021-01-12 DIAGNOSIS — D508 Other iron deficiency anemias: Secondary | ICD-10-CM | POA: Diagnosis not present

## 2021-01-12 DIAGNOSIS — D519 Vitamin B12 deficiency anemia, unspecified: Secondary | ICD-10-CM | POA: Diagnosis not present

## 2021-01-12 DIAGNOSIS — E539 Vitamin B deficiency, unspecified: Secondary | ICD-10-CM | POA: Diagnosis not present

## 2021-01-12 DIAGNOSIS — E782 Mixed hyperlipidemia: Secondary | ICD-10-CM | POA: Diagnosis not present

## 2021-01-12 DIAGNOSIS — J302 Other seasonal allergic rhinitis: Secondary | ICD-10-CM | POA: Diagnosis not present

## 2021-01-12 DIAGNOSIS — F411 Generalized anxiety disorder: Secondary | ICD-10-CM | POA: Diagnosis not present

## 2021-01-12 DIAGNOSIS — E785 Hyperlipidemia, unspecified: Secondary | ICD-10-CM | POA: Diagnosis not present

## 2021-01-12 DIAGNOSIS — K219 Gastro-esophageal reflux disease without esophagitis: Secondary | ICD-10-CM | POA: Diagnosis not present

## 2021-01-12 DIAGNOSIS — Z516 Encounter for desensitization to allergens: Secondary | ICD-10-CM | POA: Diagnosis not present

## 2021-01-12 DIAGNOSIS — I10 Essential (primary) hypertension: Secondary | ICD-10-CM | POA: Diagnosis not present

## 2021-01-12 DIAGNOSIS — E039 Hypothyroidism, unspecified: Secondary | ICD-10-CM | POA: Diagnosis not present

## 2021-01-12 DIAGNOSIS — N390 Urinary tract infection, site not specified: Secondary | ICD-10-CM | POA: Diagnosis not present

## 2021-01-21 DIAGNOSIS — D519 Vitamin B12 deficiency anemia, unspecified: Secondary | ICD-10-CM | POA: Diagnosis not present

## 2021-01-21 DIAGNOSIS — E782 Mixed hyperlipidemia: Secondary | ICD-10-CM | POA: Diagnosis not present

## 2021-01-21 DIAGNOSIS — D631 Anemia in chronic kidney disease: Secondary | ICD-10-CM | POA: Diagnosis not present

## 2021-01-21 DIAGNOSIS — J302 Other seasonal allergic rhinitis: Secondary | ICD-10-CM | POA: Diagnosis not present

## 2021-01-21 DIAGNOSIS — M859 Disorder of bone density and structure, unspecified: Secondary | ICD-10-CM | POA: Diagnosis not present

## 2021-01-21 DIAGNOSIS — E039 Hypothyroidism, unspecified: Secondary | ICD-10-CM | POA: Diagnosis not present

## 2021-01-21 DIAGNOSIS — I10 Essential (primary) hypertension: Secondary | ICD-10-CM | POA: Diagnosis not present

## 2021-01-21 DIAGNOSIS — F411 Generalized anxiety disorder: Secondary | ICD-10-CM | POA: Diagnosis not present

## 2021-01-21 DIAGNOSIS — K219 Gastro-esophageal reflux disease without esophagitis: Secondary | ICD-10-CM | POA: Diagnosis not present

## 2021-01-25 ENCOUNTER — Ambulatory Visit: Payer: Medicare Other | Admitting: Allergy & Immunology

## 2021-02-14 DIAGNOSIS — J019 Acute sinusitis, unspecified: Secondary | ICD-10-CM | POA: Diagnosis not present

## 2021-02-20 ENCOUNTER — Encounter: Payer: Self-pay | Admitting: Internal Medicine

## 2021-03-08 ENCOUNTER — Other Ambulatory Visit: Payer: Self-pay

## 2021-03-08 ENCOUNTER — Ambulatory Visit (INDEPENDENT_AMBULATORY_CARE_PROVIDER_SITE_OTHER): Payer: Medicare Other | Admitting: Allergy & Immunology

## 2021-03-08 ENCOUNTER — Encounter: Payer: Self-pay | Admitting: Allergy & Immunology

## 2021-03-08 VITALS — BP 182/90 | HR 82 | Temp 97.6°F | Resp 16 | Ht 62.0 in | Wt 108.6 lb

## 2021-03-08 DIAGNOSIS — J310 Chronic rhinitis: Secondary | ICD-10-CM

## 2021-03-08 NOTE — Progress Notes (Signed)
NEW PATIENT  Date of Service/Encounter:  03/08/21  Referring provider: Celene Squibb, MD   Assessment:   Chronic rhinitis - s/p 30+ years allergen immunotherapy   Plan/Recommendations:   1. Chronic rhinitis  - Testing was negative to the entire panel. - We did not do the more sensitive testing with intradermal testing, but we can do that in the future. - In the meantime, we are going to try to change your medications around to help with postnasal drip. - Stop the Xyzal and start carbinoxamine 4mg  every 8 hours as needed.  - We will send this into [facility].  - We can always do the more sensitive testing in the future if needed. - We are going to get your outside medical records from Dr. Harold Hedge.    2. Return in about 4 weeks (around 04/05/2021).   Subjective:   Christine Leblanc is a 87 y.o. female presenting today for evaluation of  Chief Complaint  Patient presents with  . Establish Care  . Abstract    Christine Leblanc has a history of the following: Patient Active Problem List   Diagnosis Date Noted  . Mild carotid artery disease ([place]) 05/19/2020  . Near syncope 12/26/2018  . History of TIA (transient ischemic attack) 12/25/2018  . Neck pain   . Chest pain 09/30/2018  . Generalized weakness 09/30/2018  . Hypokalemia   . MGUS (monoclonal gammopathy of unknown significance) 10/21/2017  . Primary localized osteoarthrosis of right shoulder 06/21/2015  . S/P shoulder replacement 06/21/2015  . Dysphagia, idiopathic 11/29/2014  . Osteoarthritis of right knee 07/27/2014  . Postsurgical hypothyroidism 04/22/2014  . Hypothyroidism 10/12/2013  . Syncope and collapse   . Anemia, normocytic normochromic 04/26/2012  . Gastroesophageal reflux disease   . Essential hypertension 12/07/2011  . Leg pain, bilateral 04/27/2011  . Dyslipidemia 04/27/2011  . Other dysphagia 06/07/2010    History obtained from: chart review and patient.  Christine Leblanc was referred by  Celene Squibb, MD.     Miamarie is a 87 y.o. female presenting for an evaluation of environmental allergies.  She actually has been on allergen immunotherapy for approximately 30+ years.  She has been followed by various physicians, but currently is seen by Dr. Harold Hedge at [facility].  Her vials are made there and they are given at her primary care provider's office.  However, she is not happy with our allergy shots being given.  Only according to the patient, the LPN who gives the shot does not even wipe her skin with alcohol.  Therefore, she comes here for to change her treatment to our office.  It seems that her main complaint is a runny nose that has been ongoing on for years. Her last shot was December 20th. She saw She started getting her shots with Dr. Nevada Crane (PCP) around two years ago. This was a pain per the patient, because she reports that there were issues with how she was giving injections. She was actually getting shots every 3 weeks because her symptoms got a little worse.  Prior to this, she was getting them every 4 weeks.  She does not remember the last time that she was tested by Dr. Harold Hedge. She was previously followed by Dr. Garrison Columbus.  She is on fluticasone one spray per nostril daily. She is also on levocetirizine 5mg  daily. She has nasal ipratropium which she uses as needed.  She has used a number of other  antihistamines.  However, her main complaint is this runny nose.  She said she always has to have a tissue with her.  Otherwise, there is no history of other atopic diseases, including asthma, food allergies, drug allergies, stinging insect allergies, eczema, urticaria or contact dermatitis. There is no significant infectious history. Vaccinations are up to date.    Past Medical History: Patient Active Problem List   Diagnosis Date Noted  . Mild carotid artery disease ([place]) 05/19/2020  . Near syncope 12/26/2018  . History of TIA (transient ischemic attack)  12/25/2018  . Neck pain   . Chest pain 09/30/2018  . Generalized weakness 09/30/2018  . Hypokalemia   . MGUS (monoclonal gammopathy of unknown significance) 10/21/2017  . Primary localized osteoarthrosis of right shoulder 06/21/2015  . S/P shoulder replacement 06/21/2015  . Dysphagia, idiopathic 11/29/2014  . Osteoarthritis of right knee 07/27/2014  . Postsurgical hypothyroidism 04/22/2014  . Hypothyroidism 10/12/2013  . Syncope and collapse   . Anemia, normocytic normochromic 04/26/2012  . Gastroesophageal reflux disease   . Essential hypertension 12/07/2011  . Leg pain, bilateral 04/27/2011  . Dyslipidemia 04/27/2011  . Other dysphagia 06/07/2010    Medication List:  Allergies as of 03/08/2021      Reactions   Celecoxib Shortness Of Breath   Dexilant [dexlansoprazole] Anaphylaxis   abd pain   Famotidine    Makes her feel "very bad"      Medication List       Accurate as of March 08, 2021  3:30 PM. If you have any questions, ask your nurse or doctor.        STOP taking these medications   montelukast 10 MG tablet Commonly known as: SINGULAIR Stopped by: Valentina Shaggy, MD   vitamin E 180 MG (400 UNITS) capsule Stopped by: Valentina Shaggy, MD     TAKE these medications   albuterol 108 (90 Base) MCG/ACT inhaler Commonly known as: VENTOLIN HFA Inhale 1-2 puffs into the lungs every 6 (six) hours as needed for wheezing or shortness of breath. Wheezing, Asthma Symptoms   aspirin EC 81 MG tablet Take 81 mg by mouth daily. Swallow whole.   CALCIUM-VITAMIN D PO Take by mouth. Calcium 600 mg and Vitamin D 1000 unit. One tablet daily   diazepam 5 MG tablet Commonly known as: VALIUM Take one tablet by mouth at bedtime as needed for sleep   EPINEPHrine 0.3 mg/0.3 mL Soaj injection Commonly known as: EPI-PEN Inject into the muscle as needed for anaphylaxis.   ferrous sulfate 325 (65 FE) MG tablet Take 325 mg by mouth daily with breakfast.   fluticasone  50 MCG/ACT nasal spray Commonly known as: FLONASE Place 1-2 sprays into both nostrils daily as needed for allergies or rhinitis.   GLUCOSAMINE 1500 COMPLEX PO Take by mouth. Takes 1500mg  with Chonroitin 1200mg  once daily arthritis pain relief- 8 hr caplet once as needed.   ipratropium 0.03 % nasal spray Commonly known as: ATROVENT Place 2 sprays into both nostrils daily as needed (allergies stuffy nose).   levocetirizine 5 MG tablet Commonly known as: XYZAL Take 5 mg by mouth every evening.   levothyroxine 75 MCG tablet Commonly known as: SYNTHROID TAKE 1 TABLET DAILY ON MONDAY THROUGH SATURDAY AND ONE-HALF (1/2) TABLET ON SUNDAY   omeprazole 40 MG capsule Commonly known as: PRILOSEC TAKE 1 CAPSULE BY MOUTH 30 MINUTES PRIOR TO BREAKFAST.   pravastatin 40 MG tablet Commonly known as: PRAVACHOL Take 2 tablets (80 mg total) by mouth daily.  senna 8.6 MG tablet Commonly known as: SENOKOT Take 2 tablets by mouth at bedtime.   vitamin B-12 1000 MCG tablet Commonly known as: CYANOCOBALAMIN Take 1,000 mcg by mouth every morning.   VITAMIN B6 PO Take by mouth at bedtime.   vitamin C 500 MG tablet Commonly known as: ASCORBIC ACID Take 500 mg by mouth daily.       Birth History: non-contributory  Developmental History: non-contributory  Past Surgical History: Past Surgical History:  Procedure Laterality Date  . ABDOMINAL HYSTERECTOMY     fibroids  . CATARACT EXTRACTION     Bilateral; redo surgery on the right for incomplete primary procedure  . COLONOSCOPY  Remote  . ESOPHAGOGASTRODUODENOSCOPY  12/10   Dr. 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 ESOPHAGOGASTRODUODENOSCOPY N/A 03/21/2015   Procedure: ESOPHAGOGASTRODUODENOSCOPY (EGD);  Surgeon: Danie Binder, MD;  [facility];  Service: Endoscopy;  Laterality: N/A;  1130   . EYE SURGERY    . PROLAPSED UTERINE FIBROID LIGATION     outcomed with  rectocele & cystocele  . SAVORY DILATION N/A 03/21/2015   Procedure: SAVORY DILATION;  Surgeon: Danie Binder, MD;  [facility];  Service: Endoscopy;  Laterality: N/A;  . SHOULDER SURGERY     left  . TOTAL KNEE ARTHROPLASTY Right 07/27/2014   Procedure: RIGHT TOTAL KNEE ARTHROPLASTY;  Surgeon: Johnny Bridge, MD;  Location: [facility];  Service: Orthopedics;  Laterality: Right;  . TOTAL SHOULDER ARTHROPLASTY Right 06/21/2015   Procedure: RIGHT TOTAL SHOULDER ARTHROPLASTY;  Surgeon: Marchia Bond, MD;  Location: [facility];  Service: Orthopedics;  Laterality: Right;  . TOTAL THYROIDECTOMY  2010     Family History: Family History  Problem Relation Age of Onset  . Heart disease Mother   . Gallbladder disease Mother   . Heart failure Mother   . Diabetes Mother   . Breast cancer Sister        30 years ago  . Diabetes Sister   . Heart attack Father   . Diabetes Maternal Grandmother   . Colon cancer Neg Hx   . Stroke Neg Hx      Social History: Peony lives at home in a house that is 85 years old.  There is carpeting in the main living areas and hardwoods in the bedrooms.  She has gas heating and central cooling so with some window units and fans.  There are no animals inside or outside of the home.  She does not have dust mite covers on her bedding.  There is no tobacco exposure.  She is retired.  She was a nurse in the past.  She does not have a HEPA filter.  She smoked for 18 years but is no longer smoking.  She does not live near an interstate or industrial area.   Review of Systems  Constitutional: Negative.  Negative for fever, malaise/fatigue and weight loss.  HENT: Negative.  Negative for congestion, ear discharge and ear pain.        Positive for rhinorrhea and postnasal drip.  Eyes: Negative for pain, discharge and redness.  Respiratory: Negative for cough, sputum production, shortness of breath and wheezing.   Cardiovascular: Negative.  Negative for chest pain and  palpitations.  Gastrointestinal: Negative for abdominal pain and heartburn.  Skin: Negative.  Negative for itching and rash.  Neurological: Negative for dizziness and headaches.  Endo/Heme/Allergies: Negative for environmental allergies. Does not bruise/bleed easily.  Objective:   Blood pressure (!) 182/90, pulse 82, temperature 97.6 F (36.4 C), temperature source Temporal, resp. rate 16, height 5\' 2"  (1.575 m), weight 108 lb 9.6 oz (49.3 kg), SpO2 96 %. Body mass index is 19.86 kg/m.   Physical Exam:   Physical Exam Constitutional:      Appearance: She is well-developed.  HENT:     Head: Normocephalic and atraumatic.     Right Ear: Tympanic membrane, ear canal and external ear normal. No drainage, swelling or tenderness. Tympanic membrane is not injected, scarred, erythematous, retracted or bulging.     Left Ear: Tympanic membrane, ear canal and external ear normal. No drainage, swelling or tenderness. Tympanic membrane is not injected, scarred, erythematous, retracted or bulging.     Nose: No nasal deformity, septal deviation, mucosal edema or rhinorrhea.     Right Turbinates: Enlarged and swollen.     Left Turbinates: Enlarged and swollen.     Right Sinus: No maxillary sinus tenderness or frontal sinus tenderness.     Left Sinus: No maxillary sinus tenderness or frontal sinus tenderness.     Comments: Clear rhinorrhea.    Mouth/Throat:     Mouth: Mucous membranes are not pale and not dry.     Pharynx: Uvula midline. No pharyngeal swelling, oropharyngeal exudate, posterior oropharyngeal erythema or uvula swelling.     Tonsils: 1+ on the right. 1+ on the left.  Eyes:     General:        Right eye: No discharge.        Left eye: No discharge.     Conjunctiva/sclera: Conjunctivae normal.     Right eye: Right conjunctiva is not injected. No chemosis.    Left eye: Left conjunctiva is not injected. No chemosis.    Pupils: Pupils are equal, round, and reactive to light.   Cardiovascular:     Rate and Rhythm: Normal rate and regular rhythm.     Heart sounds: Normal heart sounds.  Pulmonary:     Effort: Pulmonary effort is normal. No tachypnea, accessory muscle usage or respiratory distress.     Breath sounds: Normal breath sounds. No wheezing, rhonchi or rales.  Chest:     Chest wall: No tenderness.  Abdominal:     Tenderness: There is no abdominal tenderness. There is no guarding or rebound.  Lymphadenopathy:     Head:     Right side of head: No submandibular, tonsillar or occipital adenopathy.     Left side of head: No submandibular, tonsillar or occipital adenopathy.     Cervical: No cervical adenopathy.  Skin:    Coloration: Skin is not pale.     Findings: No abrasion, erythema, petechiae or rash. Rash is not papular, urticarial or vesicular.  Neurological:     Mental Status: She is alert.      Diagnostic studies:   Allergy Studies:     Airborne Adult Perc - 03/08/21 1400    Time Antigen Placed 1456    Allergen Manufacturer Lavella Hammock    Location Back    Number of Test 59    1. Control-Buffer 50% Glycerol Negative    2. Control-Histamine 1 mg/ml 2+    3. Albumin saline Negative    4. [place] Negative    5. [place] Negative    6. [place] Negative    7. [place] Blue Negative    8. Meadow Fescue Negative    9. Perennial Rye Negative    10. Sweet Vernal Negative  11. Timothy Negative    12. Cocklebur Negative    13. Burweed Marshelder Negative    14. Ragweed, short Negative    15. Ragweed, Giant Negative    16. Plantain,  English Negative    17. Lamb's Quarters Negative    18. Sheep Sorrell Negative    19. Rough Pigweed Negative    20. Marsh Elder, Rough Negative    21. Mugwort, Common Negative    22. Ash mix Negative    23. Birch mix Negative    24. Beech American Negative    25. Box, Elder Negative    26. Cedar, red Negative    27. Cottonwood, [place] Negative    28. Elm mix Negative    29. Hickory Negative    30. Maple mix  Negative    31. Oak, [place] mix Negative    32. Pecan Pollen Negative    33. Pine mix Negative    34. Sycamore Eastern Negative    35. [place], Black Pollen Negative    36. Alternaria alternata Negative    37. Cladosporium Herbarum Negative    38. Aspergillus mix Negative    39. Penicillium mix Negative    40. Bipolaris sorokiniana (Helminthosporium) Negative    41. Drechslera spicifera (Curvularia) Negative    42. Mucor plumbeus Negative    43. Fusarium moniliforme Negative    44. Aureobasidium pullulans (pullulara) Negative    45. Rhizopus oryzae Negative    46. Botrytis cinera Negative    47. Epicoccum nigrum Negative    48. Phoma betae Negative    49. Candida Albicans Negative    50. Trichophyton mentagrophytes Negative    51. Mite, D Farinae  5,000 AU/ml Negative    52. Mite, D Pteronyssinus  5,000 AU/ml Negative    53. Cat Hair 10,000 BAU/ml Negative    54.  Dog Epithelia Negative    55. Mixed Feathers Negative    56. Horse Epithelia Negative    57. Cockroach, German Negative    58. Mouse Negative    59. Tobacco Leaf Negative           Allergy testing results were read and interpreted by myself, documented by clinical staff.         Salvatore Marvel, MD [facility]

## 2021-03-08 NOTE — Patient Instructions (Addendum)
1. Chronic rhinitis  - Testing was negative to the entire panel. - We did not do the more sensitive testing with intradermal testing, but we can do that in the future. - In the meantime, we are going to try to change your medications around to help with postnasal drip. - Stop the Xyzal and start carbinoxamine 4mg  every 8 hours as needed.  - We will send this into [facility].  - We can always do the more sensitive testing in the future if needed. - We are going to get your outside medical records from Dr. Harold Hedge.   2. Return in about 4 weeks (around 04/05/2021).    Please inform us of any Emergency Department visits, hospitalizations, or changes in symptoms. Call us before going to the ED for breathing or allergy symptoms since we might be able to fit you in for a sick visit. Feel free to contact us anytime with any questions, problems, or concerns.  It was a pleasure to meet you today!  Websites that have reliable patient information: 1. American Academy of Asthma, Allergy, and Immunology: www.aaaai.org 2. Food Allergy Research and Education (FARE): foodallergy.org 3. Mothers of Asthmatics: http://www.asthmacommunitynetwork.org 4. American College of Allergy, Asthma, and Immunology: www.acaai.org   COVID-19 Vaccine Information can be found at: ShippingScam.co.uk For questions related to vaccine distribution or appointments, please email [email or web address] or call [phone].   We realize that you might be concerned about having an allergic reaction to the COVID19 vaccines. To help with that concern, WE ARE OFFERING THE COVID19 VACCINES IN OUR OFFICE! Ask the front desk for dates!     "Like" [place] on Facebook and Instagram for our latest updates!      A healthy democracy works best when [employer] participate! Make sure you are registered to vote! If you have moved or changed any of your contact information, you  will need to get this updated before voting!  In some cases, you MAY be able to register to vote online: [email or web address]      Airborne Adult Perc - 03/08/21 1400    Time Antigen Placed [place]    Location Back    Number of Test 59    1. Control-Buffer 50% Glycerol Negative    2. Control-Histamine 1 mg/ml 2+    3. Albumin saline Negative    4. [place] Negative    5. [place] Negative    6. Johnson Negative    7. [place] Blue Negative    8. Meadow Fescue Negative    9. Perennial Rye Negative    10. Sweet Vernal Negative    11. Timothy Negative    12. Cocklebur Negative    13. Burweed Marshelder Negative    14. Ragweed, short Negative    15. Ragweed, Giant Negative    16. Plantain,  English Negative    17. Lamb's Quarters Negative    18. Sheep Sorrell Negative    19. Rough Pigweed Negative    20. Marsh Elder, Rough Negative    21. Mugwort, Common Negative    22. Ash mix Negative    23. Birch mix Negative    24. Beech American Negative    25. Box, Elder Negative    26. Cedar, red Negative    27. Cottonwood, [place] Negative    28. Elm mix Negative    29. Hickory Negative    30. Maple mix Negative    31. Oak, [place] mix Negative    32.  Pecan Pollen Negative    33. Pine mix Negative    34. Sycamore Eastern Negative    35. [place], Black Pollen Negative    36. Alternaria alternata Negative    37. Cladosporium Herbarum Negative    38. Aspergillus mix Negative    39. Penicillium mix Negative    40. Bipolaris sorokiniana (Helminthosporium) Negative    41. Drechslera spicifera (Curvularia) Negative    42. Mucor plumbeus Negative    43. Fusarium moniliforme Negative    44. Aureobasidium pullulans (pullulara) Negative    45. Rhizopus oryzae Negative    46. Botrytis cinera Negative    47. Epicoccum nigrum Negative    48. Phoma betae Negative    49. Candida Albicans Negative    50. Trichophyton mentagrophytes  Negative    51. Mite, D Farinae  5,000 AU/ml Negative    52. Mite, D Pteronyssinus  5,000 AU/ml Negative    53. Cat Hair 10,000 BAU/ml Negative    54.  Dog Epithelia Negative    55. Mixed Feathers Negative    56. Horse Epithelia Negative    57. Cockroach, German Negative    58. Mouse Negative    59. Tobacco Leaf Negative

## 2021-03-09 ENCOUNTER — Encounter: Payer: Self-pay | Admitting: Allergy & Immunology

## 2021-03-09 MED ORDER — CARBINOXAMINE MALEATE 4 MG PO TABS: 4.0000 mg | ORAL_TABLET | Freq: Three times a day (TID) | ORAL | 1 refills | Status: DC

## 2021-04-04 NOTE — Patient Instructions (Addendum)
1. Chronic rhinitis  - Testing on 03/08/2021 negative to the entire panel. Intradermals not done - Continue carbinoxamine 4mg  every 8 hours as needed.  -Continue fluticasone nasal spray 1 spray each nostril once a day as needed for stuffy nose -Continue ipratropium bromide nasal spray 1-2 sprays each nostril two to three times a day as needed for runny/drippy nose -Consider intradermal's in the future  Schedule an appointment with your ENT to discuss changes in hearing  Please let us know if this treatment plan is not working well for you. Schedule a follow up appointment in 2 months

## 2021-04-05 ENCOUNTER — Encounter: Payer: Self-pay | Admitting: Family

## 2021-04-05 ENCOUNTER — Ambulatory Visit (INDEPENDENT_AMBULATORY_CARE_PROVIDER_SITE_OTHER): Payer: Medicare Other | Admitting: Family

## 2021-04-05 ENCOUNTER — Other Ambulatory Visit: Payer: Self-pay

## 2021-04-05 VITALS — BP 120/60 | HR 85 | Resp 16 | Ht 61.0 in | Wt 115.0 lb

## 2021-04-05 DIAGNOSIS — J310 Chronic rhinitis: Secondary | ICD-10-CM | POA: Diagnosis not present

## 2021-04-05 NOTE — Progress Notes (Signed)
[place], [address] [place] [address] Dept: [phone]  FOLLOW UP NOTE  Patient ID: Christine Leblanc, female    DOB: 24-Aug-1929  Age: 87 y.o. MRN: [id number] Date of Office Visit: 04/05/2021  Assessment  Chief Complaint: Allergic Rhinitis   HPI Christine Leblanc is an 87 year old female who presents today for follow-up of chronic rhinitis.  She was last seen on March 08, 2021 by Dr. Ernst Bowler.  Chronic rhinitis is reported as the same since her last office visit.  She is currently taking carbinoxamine 4 mg twice a day, ipratropium bromide nasal spray once a day, and fluticasone nasal spray as needed.  She reports that she did not pick up and start the carbinoxamine till March 17, so she has not been on this medication for a full month.  She is not sure how much better her symptoms are, but she is not worse.  She reports clear rhinorrhea, a little bit of nasal congestion and she suspects that she has postnasal drip since she is coughing up mucus in the morning.  She questions whether if carbinoxamine could be causing the muffled sound in her ears.  She reports approximately 5 years ago she had 2 separate times where she had changes in her hearing.  She has seen Dr. Benjamine Mola, an ENT, in the past for this.  She mentions that her mouth fills with mucus and saliva at times.  She also mentions that she has dysphagia and sees GI for this.   Drug Allergies:  Allergies  Allergen Reactions  . Celecoxib Shortness Of Breath  . Dexilant [Dexlansoprazole] Anaphylaxis    abd pain  . Famotidine     Makes her feel "very bad"    Review of Systems: Review of Systems  Constitutional: Negative for chills and fever.  HENT:       Reports clear rhinorrhea, a little bit of nasal congestion, and possibly postnasal drip.  Eyes:       Reports occasional itchy watery eyes  Respiratory: Negative for cough, shortness of breath and wheezing.   Cardiovascular: Negative for chest pain and palpitations.   Gastrointestinal: Negative for heartburn.       She reports that she has reflux.  She takes a medication for this and it helps control her symptoms  Genitourinary: Negative for dysuria.  Skin: Negative for itching and rash.  Neurological: Negative for headaches.  Endo/Heme/Allergies: Negative for environmental allergies.    Physical Exam: BP 120/60 (BP Location: Left Arm, Patient Position: Sitting, Cuff Size: Normal)   Pulse 85   Resp 16   Ht 5\' 1"  (1.549 m)   Wt 115 lb (52.2 kg)   SpO2 96%   BMI 21.73 kg/m    Physical Exam Constitutional:      Appearance: Normal appearance.  HENT:     Head: Normocephalic and atraumatic.     Comments: Pharynx normal, eyes normal, ears normal, nose normal    Right Ear: Tympanic membrane, ear canal and external ear normal.     Left Ear: Tympanic membrane, ear canal and external ear normal.     Nose: Nose normal.     Mouth/Throat:     Mouth: Mucous membranes are moist.     Pharynx: Oropharynx is clear.  Eyes:     Conjunctiva/sclera: Conjunctivae normal.  Cardiovascular:     Rate and Rhythm: Regular rhythm.     Heart sounds: Normal heart sounds.  Pulmonary:     Effort: Pulmonary effort is normal.  Breath sounds: Normal breath sounds.     Comments: Lungs clear to auscultation Musculoskeletal:     Cervical back: Neck supple.  Skin:    General: Skin is warm.  Neurological:     Mental Status: She is alert and oriented to person, place, and time.  Psychiatric:        Mood and Affect: Mood normal.        Behavior: Behavior normal.        Thought Content: Thought content normal.        Judgment: Judgment normal.     Diagnostics: None  Assessment and Plan: 1. Chronic rhinitis     No orders of the defined types were placed in this encounter.   Patient Instructions  1. Chronic rhinitis  - Testing on 03/08/2021 negative to the entire panel. Intradermals not done - Continue carbinoxamine 4mg  every 8 hours as needed.  -Continue  fluticasone nasal spray 1 spray each nostril once a day as needed for stuffy nose -Continue ipratropium bromide nasal spray 1-2 sprays each nostril two to three times a day as needed for runny/drippy nose -Consider intradermal's in the future  Schedule an appointment with your ENT to discuss changes in hearing  Please let us know if this treatment plan is not working well for you. Schedule a follow up appointment in 2 months             Return in about 2 months (around 06/05/2021), or if symptoms worsen or fail to improve.    Thank you for the opportunity to care for this patient.  Please do not hesitate to contact me with questions.  Althea Charon, FNP [facility]

## 2021-04-06 ENCOUNTER — Encounter: Payer: Self-pay | Admitting: Internal Medicine

## 2021-04-06 ENCOUNTER — Ambulatory Visit (INDEPENDENT_AMBULATORY_CARE_PROVIDER_SITE_OTHER): Payer: Medicare Other | Admitting: Internal Medicine

## 2021-04-06 VITALS — BP 149/75 | HR 86 | Temp 96.6°F | Ht 62.0 in | Wt 107.8 lb

## 2021-04-06 DIAGNOSIS — K219 Gastro-esophageal reflux disease without esophagitis: Secondary | ICD-10-CM

## 2021-04-06 DIAGNOSIS — R1310 Dysphagia, unspecified: Secondary | ICD-10-CM

## 2021-04-06 DIAGNOSIS — R634 Abnormal weight loss: Secondary | ICD-10-CM | POA: Diagnosis not present

## 2021-04-06 NOTE — Patient Instructions (Addendum)
Continue on Omeprazole for chronic reflux.  Be sure to cut your food into small bites.   I would add protein shakes to your diet at least once daily for breakfast given your recent weight loss.   At [facility] we value your feedback. You may receive a survey about your visit today. Please share your experience as we strive to create trusting relationships with our patients to provide genuine, compassionate, quality care.  We appreciate your understanding and patience as we review any laboratory studies, imaging, and other diagnostic tests that are ordered as we care for you. Our office policy is 5 business days for review of these results, and any emergent or urgent results are addressed in a timely manner for your best interest. If you do not hear from our office in 1 week, please contact us.   We also encourage the use of MyChart, which contains your medical information for your review as well. If you are not enrolled in this feature, an access code is on this after visit summary for your convenience. Thank you for allowing [place] to be involved in your care.  It was great to see you today!  I hope you have a great rest of your spring!!    Elon Alas. Abbey Chatters, D.O. Gastroenterology and Hepatology [facility]

## 2021-04-06 NOTE — Progress Notes (Signed)
Referring Provider: Celene Squibb, MD Primary Care Physician:  Celene Squibb, MD Primary GI:  Dr. Abbey Chatters  Chief Complaint  Patient presents with  . Gastroesophageal Reflux    Doing ok  . Dysphagia    HPI:   Christine Leblanc is a 87 y.o. female who presents to the clinic today for follow-up visit.  She has a history of chronic GERD which is well controlled on omeprazole.  She also has dysphagia which has previously been diagnosed as idiopathic.  She states this is a chronic issue for her.  She cuts all her food up into small bites and eats very slowly.  She states it takes a long time for her to consume an entire meal.  Does note weight loss since being seen previously.  Weight is 107 today with a baseline around 115.  She attributes this to not eating much as it takes her so long to consume a meal.  Also has occasional abdominal pain if she bends over.  Bowel movements okay.  No melena hematochezia.  Otherwise no other GI complaints.  Past Medical History:  Diagnosis Date  . Anemia   . Asthma   . Cancer ([place])    skin Ca- ? basal cell   . Chronic back pain   . Chronic neck pain   . Degenerative joint disease    Left shoulder; cervical spine, knees & hands   . Gastroesophageal reflux disease    Hiatal hernia; distal esophageal web requiring dilatation; gastric polyps; gastritis; refuses colonoscopy  . GERD (gastroesophageal reflux disease)   . History of stress test 1990's   stress test done under the care of Dr. Lattie Haw & Dr. Gwenlyn Found, now being followed by Dr. Harl Bowie- in [place] , recently seen & told to f/U in one yr.   . Hyperlipidemia 04/27/2011  . Hypertension   . Hypothyroidism   . Lymphocytic thyroiditis   . Mild carotid artery disease ([place])   . Multiple thyroid nodules 2010   Adenomatous; thyroidectomy in 2010  . Orthostasis   . Osteoarthritis of right knee 07/27/2014  . Pneumonia    hosp. for pneumonia- long time ago   . Primary localized osteoarthrosis of right  shoulder 06/21/2015  . Seizures ([place])    yes- as a child- & into adult years, states she took med. for them at one time, stopped at 32 yrs. of age   . Syncope and collapse 2007   Possible CVA in 2012 with left lower extremity weakness; refused hospitalization; CT-Atrophy and chronic microvascular ischemic change.     Past Surgical History:  Procedure Laterality Date  . ABDOMINAL HYSTERECTOMY     fibroids  . CATARACT EXTRACTION     Bilateral; redo surgery on the right for incomplete primary procedure  . COLONOSCOPY  Remote  . ESOPHAGOGASTRODUODENOSCOPY  12/10   Dr. 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 ESOPHAGOGASTRODUODENOSCOPY N/A 03/21/2015   Procedure: ESOPHAGOGASTRODUODENOSCOPY (EGD);  Surgeon: Danie Binder, MD;  [facility];  Service: Endoscopy;  Laterality: N/A;  1130   . EYE SURGERY    . PROLAPSED UTERINE FIBROID LIGATION     outcomed with rectocele & cystocele  . SAVORY DILATION N/A 03/21/2015   Procedure: SAVORY DILATION;  Surgeon: Danie Binder, MD;  [facility];  Service: Endoscopy;  Laterality: N/A;  . SHOULDER SURGERY     left  . TOTAL KNEE ARTHROPLASTY Right 07/27/2014   Procedure: RIGHT  TOTAL KNEE ARTHROPLASTY;  Surgeon: Johnny Bridge, MD;  Location: [place];  Service: Orthopedics;  Laterality: Right;  . TOTAL SHOULDER ARTHROPLASTY Right 06/21/2015   Procedure: RIGHT TOTAL SHOULDER ARTHROPLASTY;  Surgeon: Marchia Bond, MD;  Location: [place];  Service: Orthopedics;  Laterality: Right;  . TOTAL THYROIDECTOMY  2010    Current Outpatient Medications  Medication Sig Dispense Refill  . albuterol (VENTOLIN HFA) 108 (90 Base) MCG/ACT inhaler Inhale 1-2 puffs into the lungs every 6 (six) hours as needed for wheezing or shortness of breath. Wheezing, Asthma Symptoms    . aspirin EC 81 MG tablet Take 81 mg by mouth daily. Swallow whole.    Marland Kitchen CALCIUM-VITAMIN D PO Take by mouth. Calcium 600 mg and  Vitamin D 1000 unit. One tablet daily    . Carbinoxamine Maleate 4 MG TABS Take 1 tablet (4 mg total) by mouth every 8 (eight) hours. 90 tablet 1  . diazepam (VALIUM) 5 MG tablet Take one tablet by mouth at bedtime as needed for sleep 30 tablet 5  . EPINEPHrine 0.3 mg/0.3 mL IJ SOAJ injection Inject into the muscle as needed for anaphylaxis.    . ferrous sulfate 325 (65 FE) MG tablet Take 325 mg by mouth daily with breakfast.    . fluticasone (FLONASE) 50 MCG/ACT nasal spray Place 1-2 sprays into both nostrils daily as needed for allergies or rhinitis.    . Glucosamine-Chondroit-Vit C-Mn (GLUCOSAMINE 1500 COMPLEX PO) Take by mouth. Takes 1500mg  with Chonroitin 1200mg  once daily arthritis pain relief- 8 hr caplet once as needed.    Marland Kitchen ipratropium (ATROVENT) 0.03 % nasal spray Place 2 sprays into both nostrils daily as needed (allergies stuffy nose).     Marland Kitchen levothyroxine (SYNTHROID) 75 MCG tablet TAKE 1 TABLET DAILY ON MONDAY THROUGH SATURDAY AND ONE-HALF (1/2) TABLET ON SUNDAY 78 tablet 3  . omeprazole (PRILOSEC) 40 MG capsule TAKE 1 CAPSULE BY MOUTH 30 MINUTES PRIOR TO BREAKFAST. 90 capsule 3  . pravastatin (PRAVACHOL) 40 MG tablet Take 2 tablets (80 mg total) by mouth daily. 180 tablet 3  . Pyridoxine HCl (VITAMIN B6 PO) Take by mouth at bedtime.    . senna (SENOKOT) 8.6 MG tablet Take 2 tablets by mouth at bedtime.     . vitamin B-12 (CYANOCOBALAMIN) 1000 MCG tablet Take 1,000 mcg by mouth every morning.    . vitamin C (ASCORBIC ACID) 500 MG tablet Take 500 mg by mouth daily.    . levocetirizine (XYZAL) 5 MG tablet Take 5 mg by mouth every evening. (Patient not taking: Reported on 04/06/2021)     No current facility-administered medications for this visit.    Allergies as of 04/06/2021 - Review Complete 04/06/2021  Allergen Reaction Noted  . Celecoxib Shortness Of Breath   . Dexilant [dexlansoprazole] Anaphylaxis 05/04/2016  . Famotidine  11/04/2018    Family History  Problem Relation Age  of Onset  . Heart disease Mother   . Gallbladder disease Mother   . Heart failure Mother   . Diabetes Mother   . Breast cancer Sister        30  years ago  . Diabetes Sister   . Heart attack Father   . Diabetes Maternal Grandmother   . Colon cancer Neg Hx   . Stroke Neg Hx     Social History   Socioeconomic History  . Marital status: Widowed    Spouse name: Not on file  . Number of children: 4  . Years of education: 2  .  Highest education level: Not on file  Occupational History  . 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 Occupation: Retired    Comment: Marine scientist  Tobacco Use  . Smoking status: Former Smoker    Packs/day: 0.80    Years: 20.00    Pack years: 16.00    Types: Cigarettes    Quit date: 12/31/1978    Years since quitting: 42.2  . Smokeless tobacco: Never Used  Vaping Use  . Vaping Use: Never used  Substance and Sexual Activity  . Alcohol use: No  . Drug use: No  . Sexual activity: Never    Birth control/protection: Surgical    Comment: widowed since 2010  Other Topics Concern  . Not on file  Social History Narrative   Lives at home. Her daughter lives with her.    Caffeine use: 1 cup coffee per day   Social Determinants of Health   Financial Resource Strain: Not on file  Food Insecurity: Not on file  Transportation Needs: Not on file  Physical Activity: Not on file  Stress: Not on file  Social Connections: Not on file    Subjective: Review of Systems  Constitutional: Negative for chills and fever.  HENT: Negative for congestion and hearing loss.   Eyes: Negative for blurred vision and double vision.  Respiratory: Negative for cough and shortness of breath.   Cardiovascular: Negative for chest pain and palpitations.  Gastrointestinal: Positive for heartburn. Negative for abdominal pain, blood in stool, constipation, diarrhea, melena and vomiting.       Dysphagia  Genitourinary: Negative for dysuria and urgency.   Musculoskeletal: Negative for joint pain and myalgias.  Skin: Negative for itching and rash.  Neurological: Negative for dizziness and headaches.  Psychiatric/Behavioral: Negative for depression. The patient is not nervous/anxious.      Objective: BP (!) 149/75   Pulse 86   Temp (!) 96.6 F (35.9 C) (Temporal)   Ht 5\' 2"  (1.575 m)   Wt 107 lb 12.8 oz (48.9 kg)   BMI 19.72 kg/m  Physical Exam Constitutional:      Appearance: Normal appearance.  HENT:     Head: Normocephalic and atraumatic.  Eyes:     Extraocular Movements: Extraocular movements intact.     Conjunctiva/sclera: Conjunctivae normal.  Cardiovascular:     Rate and Rhythm: Normal rate and regular rhythm.  Pulmonary:     Effort: Pulmonary effort is normal.     Breath sounds: Normal breath sounds.  Abdominal:     General: Bowel sounds are normal.     Palpations: Abdomen is soft.  Musculoskeletal:        General: No swelling. Normal range of motion.     Cervical back: Normal range of motion and neck supple.  Skin:    General: Skin is warm and dry.     Coloration: Skin is not jaundiced.  Neurological:     General: No focal deficit present.     Mental Status: She is alert and oriented to person, place, and time.  Psychiatric:        Mood and Affect: Mood normal.        Behavior: Behavior normal.      Assessment: *Dysphagia-chronic *GERD-chronic, well controlled *Weight loss  Plan: GERD well-controlled on omeprazole.  Will continue today.  Patient to call for refills.  Given her chronic dysphagia, I did stress the importance of cutting all her food up into small bites and she understands.  I  am concerned about her weight loss and have recommended that she add protein shakes at least every morning for breakfast and a second 1 if able at night with dinner.  She states she used to do this and will start consuming them again as she does like the taste.  Patient follow-up in 6 months or sooner if  needed.  04/06/2021 2:26 PM   Disclaimer: This note was dictated with voice recognition software. Similar sounding words can inadvertently be transcribed and may not be corrected upon.

## 2021-04-15 ENCOUNTER — Other Ambulatory Visit: Payer: Self-pay | Admitting: Allergy & Immunology

## 2021-05-08 DIAGNOSIS — H838X3 Other specified diseases of inner ear, bilateral: Secondary | ICD-10-CM | POA: Diagnosis not present

## 2021-05-08 DIAGNOSIS — H903 Sensorineural hearing loss, bilateral: Secondary | ICD-10-CM | POA: Diagnosis not present

## 2021-05-18 ENCOUNTER — Telehealth: Payer: Self-pay | Admitting: Internal Medicine

## 2021-05-18 NOTE — Telephone Encounter (Signed)
Pt said she needed a new prescription of Omeprazole 40 mg sent to [facility]

## 2021-05-18 NOTE — Telephone Encounter (Signed)
This pt needs a new Rx for Omeprazole 40 mg sent to [facility]. Last one written was 02/2020

## 2021-05-22 ENCOUNTER — Encounter: Payer: Self-pay | Admitting: Endocrinology

## 2021-05-22 ENCOUNTER — Ambulatory Visit (INDEPENDENT_AMBULATORY_CARE_PROVIDER_SITE_OTHER): Payer: Medicare Other | Admitting: Endocrinology

## 2021-05-22 ENCOUNTER — Other Ambulatory Visit: Payer: Self-pay

## 2021-05-22 VITALS — BP 134/74 | HR 80 | Ht 62.0 in | Wt 106.2 lb

## 2021-05-22 DIAGNOSIS — E890 Postprocedural hypothyroidism: Secondary | ICD-10-CM | POA: Diagnosis not present

## 2021-05-22 LAB — T4, FREE: Free T4: 1.49 ng/dL (ref 0.60–1.60)

## 2021-05-22 LAB — TSH: TSH: 1.69 u[IU]/mL (ref 0.35–4.50)

## 2021-05-22 NOTE — Progress Notes (Signed)
Christine Leblanc 87 y.o.    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  She has been followed every 6-12 months for her thyroid supplementation and monitoring of her levels However has not been seen in follow-up since 9/18  She previously had been on 100 mcg since 03/2014  This was subsequently reduced down to 75 g  She takes levothyroxine 75 mcg, 6-1/2 tablets a week fairly consistently  She was last seen almost a year ago and missed her appointment for 6 months She is still on generic levothyroxine from [facility] She is quite regular with the supplements As before she takes Prilosec daily in the mornings also  She states she feels fairly good and appears to have the same energy level She does appear to be losing weight which is not new  She has been given iron and calcium supplements and is taking these at least 2 hours after her levothyroxine dose  TSH on her last visit was 1.4  Wt Readings from Last 3 Encounters:  05/22/21 106 lb 4 oz (48.2 kg)  04/06/21 107 lb 12.8 oz (48.9 kg)  04/05/21 115 lb (52.2 kg)   Lab Results  Component Value Date   TSH 1.38 06/17/2020   TSH 0.317 (L) 05/20/2020   TSH 0.19 (L) 04/26/2020   FREET4 1.73 (H) 04/26/2020   FREET4 1.22 09/11/2018   FREET4 1.78 (H) 09/12/2017       Allergies as of 05/22/2021      Reactions   Celecoxib Shortness Of Breath   Dexilant [dexlansoprazole] Anaphylaxis   abd pain   Famotidine    Makes her feel "very bad"      Medication List       Accurate as of May 22, 2021  3:10 PM. If you have any questions, ask your nurse or doctor.        albuterol 108 (90 Base) MCG/ACT inhaler Commonly known as: VENTOLIN HFA Inhale 1-2 puffs into the lungs every 6 (six)  hours as needed for wheezing or shortness of breath. Wheezing, Asthma Symptoms   aspirin EC 81 MG tablet Take 81 mg by mouth daily. Swallow whole.   CALCIUM-VITAMIN D PO Take by mouth. Calcium 600 mg and Vitamin D 1000 unit. One tablet daily   Carbinoxamine Maleate 4 MG Tabs TAKE 1 TABLET BY MOUTH EVERY 8 HOURS.   diazepam 5 MG tablet Commonly known as: VALIUM Take one tablet by mouth at bedtime as needed for sleep   EPINEPHrine 0.3 mg/0.3 mL Soaj injection Commonly known as: EPI-PEN Inject into the muscle as needed for anaphylaxis.   ferrous sulfate 325 (65 FE) MG tablet Take 325 mg by mouth daily with breakfast.   fluticasone 50 MCG/ACT nasal spray Commonly known as: FLONASE Place 1-2 sprays into both nostrils daily as needed for allergies or rhinitis.   GLUCOSAMINE 1500 COMPLEX PO Take by mouth. Takes 1500mg  with Chonroitin 1200mg  once daily arthritis pain relief- 8 hr caplet once as needed.   ipratropium 0.03 % nasal spray Commonly known as: ATROVENT Place 2 sprays into both nostrils daily as needed (allergies stuffy nose).   levocetirizine 5 MG tablet Commonly known as: XYZAL Take 5 mg by mouth every evening.   levothyroxine 75 MCG tablet Commonly known as:  SYNTHROID TAKE 1 TABLET DAILY ON MONDAY THROUGH SATURDAY AND ONE-HALF (1/2) TABLET ON SUNDAY   omeprazole 40 MG capsule Commonly known as: PRILOSEC TAKE 1 CAPSULE BY MOUTH 30 MINUTES PRIOR TO BREAKFAST.   pravastatin 40 MG tablet Commonly known as: PRAVACHOL Take 2 tablets (80 mg total) by mouth daily.   senna 8.6 MG tablet Commonly known as: SENOKOT Take 2 tablets by mouth at bedtime.   vitamin B-12 1000 MCG tablet Commonly known as: CYANOCOBALAMIN Take 1,000 mcg by mouth every morning.   VITAMIN B6 PO Take by mouth at bedtime.   vitamin C 500 MG tablet Commonly known as: ASCORBIC ACID Take 500 mg by mouth daily.       Allergies:  Allergies  Allergen Reactions  . Celecoxib Shortness Of  Breath  . Dexilant [Dexlansoprazole] Anaphylaxis    abd pain  . Famotidine     Makes her feel "very bad"    Past Medical History:  Diagnosis Date  . Anemia   . Asthma   . Cancer ([place])    skin Ca- ? basal cell   . Chronic back pain   . Chronic neck pain   . Degenerative joint disease    Left shoulder; cervical spine, knees & hands   . Gastroesophageal reflux disease    Hiatal hernia; distal esophageal web requiring dilatation; gastric polyps; gastritis; refuses colonoscopy  . GERD (gastroesophageal reflux disease)   . History of stress test 1990's   stress test done under the care of Dr. Lattie Haw & Dr. Gwenlyn Found, now being followed by Dr. Harl Bowie- in [place] , recently seen & told to f/U in one yr.   . Hyperlipidemia 04/27/2011  . Hypertension   . Hypothyroidism   . Lymphocytic thyroiditis   . Mild carotid artery disease ([place])   . Multiple thyroid nodules 2010   Adenomatous; thyroidectomy in 2010  . Orthostasis   . Osteoarthritis of right knee 07/27/2014  . Pneumonia    hosp. for pneumonia- long time ago   . Primary localized osteoarthrosis of right shoulder 06/21/2015  . Seizures ([place])    yes- as a child- & into adult years, states she took med. for them at one time, stopped at 32 yrs. of age   . Syncope and collapse 2007   Possible CVA in 2012 with left lower extremity weakness; refused hospitalization; CT-Atrophy and chronic microvascular ischemic change.     Past Surgical History:  Procedure Laterality Date  . ABDOMINAL HYSTERECTOMY     fibroids  . CATARACT EXTRACTION     Bilateral; redo surgery on the right for incomplete primary procedure  . COLONOSCOPY  Remote  . ESOPHAGOGASTRODUODENOSCOPY  12/10   Dr. 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 ESOPHAGOGASTRODUODENOSCOPY N/A 03/21/2015   Procedure: ESOPHAGOGASTRODUODENOSCOPY (EGD);  Surgeon: Danie Binder, MD;  [facility];  Service: Endoscopy;   Laterality: N/A;  1130   . EYE SURGERY    . PROLAPSED UTERINE FIBROID LIGATION     outcomed with rectocele & cystocele  . SAVORY DILATION N/A 03/21/2015   Procedure: SAVORY DILATION;  Surgeon: Danie Binder, MD;  [facility];  Service: Endoscopy;  Laterality: N/A;  . SHOULDER SURGERY     left  . TOTAL KNEE ARTHROPLASTY Right 07/27/2014   Procedure: RIGHT TOTAL KNEE ARTHROPLASTY;  Surgeon: Johnny Bridge, MD;  Location: [place];  Service: Orthopedics;  Laterality: Right;  . TOTAL SHOULDER ARTHROPLASTY Right 06/21/2015   Procedure:  RIGHT TOTAL SHOULDER ARTHROPLASTY;  Surgeon: Marchia Bond, MD;  Location: [place];  Service: Orthopedics;  Laterality: Right;  . TOTAL THYROIDECTOMY  2010    Family History  Problem Relation Age of Onset  . Heart disease Mother   . Gallbladder disease Mother   . Heart failure Mother   . Diabetes Mother   . Breast cancer Sister        30 years ago  . Diabetes Sister   . Heart attack Father   . Diabetes Maternal Grandmother   . Colon cancer Neg Hx   . Stroke Neg Hx     Social History:  reports that she quit smoking about 42 years ago. Her smoking use included cigarettes. She has a 16.00 pack-year smoking history. She has never used smokeless tobacco. She reports that she does not drink alcohol and does not use drugs.  REVIEW Of SYSTEMS:  ROS  Weight History:  Wt Readings from Last 3 Encounters:  05/22/21 106 lb 4 oz (48.2 kg)  04/06/21 107 lb 12.8 oz (48.9 kg)  04/05/21 115 lb (52.2 kg)    History of hypercholesterolemia followed by PCP, treated with pravastatin 40 mg   She has chronic difficulty swallowing      Examination:   BP 134/74   Pulse 80   Ht 5\' 2"  (1.575 m)   Wt 106 lb 4 oz (48.2 kg)   SpO2 97%   BMI 19.43 kg/m   Thyroid nonpalpable No tremor Biceps reflexes show normal relaxation No edema     Assessments   Hypothyroidism, postsurgical and long-standing  She has been taking 75 mcg levothyroxine, has been  most recently taking 6-1/2 tablets a week  Her TSH last year and normalized with the previous change and needs follow-up labs  Clinically appears to be doing fairly well and looks euthyroid She has had some weight loss likely from decreased intake overall She takes her levothyroxine regularly  Labs to be done today, to decide on the dose and follow-up in 6 months if no changes made     There are no Patient Instructions on file for this visit.   Elayne Snare 05/22/2021, 3:10 PM     Addendum: TSH is 0.19, she will continue brand-name Synthroid but take only 6-1/2 tablets a week now

## 2021-05-22 NOTE — Progress Notes (Signed)
Please call to let patient know that the thyroid  results are normal and no further action needed

## 2021-05-24 ENCOUNTER — Telehealth: Payer: Self-pay | Admitting: Internal Medicine

## 2021-05-24 MED ORDER — OMEPRAZOLE 40 MG PO CPDR: 40.0000 mg | DELAYED_RELEASE_CAPSULE | Freq: Every day | ORAL | 3 refills | Status: DC

## 2021-05-24 NOTE — Telephone Encounter (Signed)
Phoned and advised the pt of this.

## 2021-05-24 NOTE — Telephone Encounter (Signed)
noted 

## 2021-05-24 NOTE — Telephone Encounter (Signed)
Year supply refill sent to [facility].  Thank you

## 2021-05-30 ENCOUNTER — Other Ambulatory Visit: Payer: Self-pay | Admitting: Student

## 2021-06-09 ENCOUNTER — Ambulatory Visit (INDEPENDENT_AMBULATORY_CARE_PROVIDER_SITE_OTHER): Payer: Medicare Other | Admitting: Allergy & Immunology

## 2021-06-09 ENCOUNTER — Encounter: Payer: Self-pay | Admitting: Allergy & Immunology

## 2021-06-09 ENCOUNTER — Other Ambulatory Visit: Payer: Self-pay

## 2021-06-09 VITALS — BP 110/76 | HR 92 | Temp 98.4°F | Resp 16

## 2021-06-09 DIAGNOSIS — J310 Chronic rhinitis: Secondary | ICD-10-CM

## 2021-06-09 MED ORDER — CARBINOXAMINE MALEATE 4 MG PO TABS: 1.0000 | ORAL_TABLET | Freq: Three times a day (TID) | ORAL | 2 refills | Status: DC

## 2021-06-09 MED ORDER — CARBINOXAMINE MALEATE 4 MG PO TABS: 1.0000 | ORAL_TABLET | Freq: Three times a day (TID) | ORAL | 0 refills | Status: DC

## 2021-06-09 NOTE — Patient Instructions (Addendum)
1. Chronic rhinitis - with negative testing to the entire panel - We are going to continue with carbinoxamine 4mg  every 8 hours as needed.  - Continue fluticasone nasal spray 1 spray each nostril once a day as needed for stuffy nose -Continue ipratropium bromide nasal spray 1-2 sprays each nostril two to three times a day as needed for runny/drippy nose  2. Return to clinic in 6 months or earlier if needed.

## 2021-06-09 NOTE — Progress Notes (Signed)
FOLLOW UP  Date of Service/Encounter:  06/09/21   Assessment:   Chronic rhinitis - s/p 30+ years allergen immunotherapy    Plan/Recommendations:   1. Chronic rhinitis - with negative testing to the entire panel - We are going to continue with carbinoxamine 4mg  every 8 hours as needed.  - Continue fluticasone nasal spray 1 spray each nostril once a day as needed for stuffy nose -Continue ipratropium bromide nasal spray 1-2 sprays each nostril two to three times a day as needed for runny/drippy nose  2. Return to clinic in 6 months or earlier if needed.   Subjective:   Christine Leblanc is a 87 y.o. female presenting today for follow up of  Chief Complaint  Patient presents with   Allergies    Refills    Christine Leblanc has a history of the following: Patient Active Problem List   Diagnosis Date Noted   Mild carotid artery disease ([place]) 05/19/2020   Near syncope 12/26/2018   History of TIA (transient ischemic attack) 12/25/2018   Neck pain    Chest pain 09/30/2018   Generalized weakness 09/30/2018   Hypokalemia    MGUS (monoclonal gammopathy of unknown significance) 10/21/2017   Primary localized osteoarthrosis of right shoulder 06/21/2015   S/P shoulder replacement 06/21/2015   Dysphagia, idiopathic 11/29/2014   Osteoarthritis of right knee 07/27/2014   Postsurgical hypothyroidism 04/22/2014   Hypothyroidism 10/12/2013   Syncope and collapse    Anemia, normocytic normochromic 04/26/2012   Gastroesophageal reflux disease    Essential hypertension 12/07/2011   Leg pain, bilateral 04/27/2011   Dyslipidemia 04/27/2011   Other dysphagia 06/07/2010    History obtained from: chart review and patient.  Christine Leblanc is a 87 y.o. female presenting for a follow up visit.  She was last seen in April 2022.  At that time, she was continued on carbinoxamine 4 mg every 8 hours as well as Flonase 1 spray per nostril daily and nasal Atrovent 1 to 2 sprays per nostril up to 3 times  daily.  Since last visit, she continues to have issues with mucous in her throat. She reports that she has mucous that is terrible in the morning and at night. She does have dentures. She tells me today that one of her pills is still stuck in her throat. But she does not want to make any changes because she does feel better on the current regimen. He has been using her nose spray consistently. She denies needing antibiotics for sinus infections since the last visit. She would like some refills for the tablets in particular. She is wondering if we could send a one month supply to [facility] and then a 3 month supply to [facility].  She has a hospital wrist band today. Evidently she forgot that this was there. 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   Otherwise, there have been no changes to her past medical history, surgical history, family history, or social history.    Review of Systems  Constitutional: Negative.  Negative for fever, malaise/fatigue and weight loss.  HENT:  Positive for congestion. Negative for ear discharge and ear pain.   Eyes:  Negative for pain, discharge and redness.  Respiratory:  Negative for cough, sputum production, shortness of breath and wheezing.   Cardiovascular: Negative.  Negative for chest pain and palpitations.  Gastrointestinal:  Negative for abdominal pain and heartburn.  Skin: Negative.  Negative for  itching and rash.  Neurological:  Negative for dizziness and headaches.  Endo/Heme/Allergies:  Negative for environmental allergies. Does not bruise/bleed easily.      Objective:   Blood pressure 110/76, pulse 92, temperature 98.4 F (36.9 C), temperature source Temporal, resp. rate 16, SpO2 96 %. There is no height or weight on file to calculate BMI.   Physical Exam:  Physical Exam Constitutional:      Appearance: She is well-developed.     Comments: Very friendly.  HENT:      Head: Normocephalic and atraumatic.     Right Ear: Tympanic membrane, ear canal and external ear normal.     Left Ear: Tympanic membrane, ear canal and external ear normal.     Nose: No nasal deformity, septal deviation, mucosal edema or rhinorrhea.     Right Turbinates: Not enlarged or swollen.     Left Turbinates: Not enlarged or swollen.     Right Sinus: No maxillary sinus tenderness or frontal sinus tenderness.     Left Sinus: No maxillary sinus tenderness or frontal sinus tenderness.     Mouth/Throat:     Mouth: Mucous membranes are not pale and not dry.     Pharynx: Uvula midline.  Eyes:     General: Lids are normal. No allergic shiner.       Right eye: No discharge.        Left eye: No discharge.     Conjunctiva/sclera: Conjunctivae normal.     Right eye: Right conjunctiva is not injected. No chemosis.    Left eye: Left conjunctiva is not injected. No chemosis.    Pupils: Pupils are equal, round, and reactive to light.  Cardiovascular:     Rate and Rhythm: Normal rate and regular rhythm.     Heart sounds: Normal heart sounds.  Pulmonary:     Effort: Pulmonary effort is normal. No tachypnea, accessory muscle usage or respiratory distress.     Breath sounds: Normal breath sounds. No wheezing, rhonchi or rales.  Chest:     Chest wall: No tenderness.  Lymphadenopathy:     Cervical: No cervical adenopathy.  Skin:    Coloration: Skin is not pale.     Findings: No abrasion, erythema, petechiae or rash. Rash is not papular, urticarial or vesicular.  Neurological:     Mental Status: She is alert.  Psychiatric:        Behavior: Behavior is cooperative.     Diagnostic studies: none      Salvatore Marvel, MD  [facility]

## 2021-06-10 ENCOUNTER — Encounter: Payer: Self-pay | Admitting: Allergy & Immunology

## 2021-07-19 ENCOUNTER — Other Ambulatory Visit: Payer: Self-pay

## 2021-07-19 ENCOUNTER — Ambulatory Visit: Payer: Medicare Other

## 2021-07-19 ENCOUNTER — Ambulatory Visit (INDEPENDENT_AMBULATORY_CARE_PROVIDER_SITE_OTHER): Payer: Medicare Other | Admitting: Podiatry

## 2021-07-19 DIAGNOSIS — L989 Disorder of the skin and subcutaneous tissue, unspecified: Secondary | ICD-10-CM

## 2021-07-19 DIAGNOSIS — M79672 Pain in left foot: Secondary | ICD-10-CM | POA: Diagnosis not present

## 2021-07-19 DIAGNOSIS — M79671 Pain in right foot: Secondary | ICD-10-CM | POA: Diagnosis not present

## 2021-07-19 DIAGNOSIS — M79675 Pain in left toe(s): Secondary | ICD-10-CM | POA: Diagnosis not present

## 2021-07-19 DIAGNOSIS — M79674 Pain in right toe(s): Secondary | ICD-10-CM

## 2021-07-19 DIAGNOSIS — B351 Tinea unguium: Secondary | ICD-10-CM

## 2021-07-19 NOTE — Progress Notes (Signed)
SUBJECTIVE Patient presents to office today complaining of elongated, thickened nails that cause pain while ambulating in shoes.  Patient is unable to trim their own nails.  Patient also complains of heel pain to the bilateral heels when she sleeps.  She has tried mattress cushions and sheepskin blankets to help cushion her heels without any relief.  Patient is here for further evaluation and treatment.  Past Medical History:  Diagnosis Date   Anemia    Asthma    Cancer ([place])    skin Ca- ? basal cell    Chronic back pain    Chronic neck pain    Degenerative joint disease    Left shoulder; cervical spine, knees & hands    Gastroesophageal reflux disease    Hiatal hernia; distal esophageal web requiring dilatation; gastric polyps; gastritis; refuses colonoscopy   GERD (gastroesophageal reflux disease)    History of stress test 1990's   stress test done under the care of Dr. Lattie Haw & Dr. Gwenlyn Found, now being followed by Dr. Harl Bowie- in [place] , recently seen & told to f/U in one yr.    Hyperlipidemia 04/27/2011   Hypertension    Hypothyroidism    Lymphocytic thyroiditis    Mild carotid artery disease ([place])    Multiple thyroid nodules 2010   Adenomatous; thyroidectomy in 2010   Orthostasis    Osteoarthritis of right knee 07/27/2014   Pneumonia    hosp. for pneumonia- long time ago    Primary localized osteoarthrosis of right shoulder 06/21/2015   Seizures ([place])    yes- as a child- & into adult years, states she took med. for them at one time, stopped at 32 yrs. of age    Syncope and collapse 2007   Possible CVA in 2012 with left lower extremity weakness; refused hospitalization; CT-Atrophy and chronic microvascular ischemic change.     OBJECTIVE General Patient is awake, alert, and oriented x 3 and in no acute distress. Derm Skin is dry and supple bilateral. Negative open lesions or macerations. Remaining integument unremarkable. Nails are tender, long, thickened and dystrophic  with subungual debris, consistent with onychomycosis, 1-5 bilateral. No signs of infection noted.  Hyperkeratotic preulcerative callus lesion also noted to the right hallux Vasc  DP and PT pedal pulses palpable bilaterally. Temperature gradient within normal limits.  Neuro Epicritic and protective threshold sensation grossly intact bilaterally.  Musculoskeletal Exam No symptomatic pedal deformities noted bilateral. Muscular strength within normal limits.  ASSESSMENT 1.  Pain due to onychomycosis of toenails both 2.  Preulcerative callus lesion right hallux 3.  Nocturnal heel pain secondary to pressure bilateral  PLAN OF CARE 1. Patient evaluated today.  2. Instructed to maintain good pedal hygiene and foot care.  3. Mechanical debridement of nails 1-5 bilaterally performed using a nail nipper. Filed with dremel without incident.  4.  Excisional debridement of the preulcerative callus lesion was performed to the right hallux without incident or bleeding  5.  Offloading heel cushions /bunny boots were provided for the patient to wear at nighttime to help offload pressure from the heels  6.  Return to clinic as needed   Edrick Kins, DPM [facility]  Dr. Edrick Kins, DPM    [address], [place] [address]  Office [phone]  Fax [phone]

## 2021-07-31 ENCOUNTER — Telehealth: Payer: Self-pay | Admitting: Endocrinology

## 2021-07-31 NOTE — Telephone Encounter (Signed)
Patient called stating she is colder than normal today and last Friday she was really hot and her hair started to sweat. Patient thinks that she needs to have blood work done and would like at call back.

## 2021-08-01 ENCOUNTER — Other Ambulatory Visit: Payer: Self-pay | Admitting: Endocrinology

## 2021-08-01 DIAGNOSIS — E890 Postprocedural hypothyroidism: Secondary | ICD-10-CM

## 2021-08-01 NOTE — Telephone Encounter (Signed)
Advised patient above and she insists that ist is the result of her thyroid.  she does not want to come in for labs tomorrow morning here.  but wants to go to the lab in [place] close to her. It is called [facility] [address].  Please fax over the orders at fax number [phone]

## 2021-08-02 ENCOUNTER — Other Ambulatory Visit: Payer: Self-pay

## 2021-08-02 DIAGNOSIS — E890 Postprocedural hypothyroidism: Secondary | ICD-10-CM

## 2021-08-02 NOTE — Telephone Encounter (Signed)
Labs were faxed over. [facility] is part of [facility] so they should be able to see lab orders in patient chart also.

## 2021-08-02 NOTE — Telephone Encounter (Signed)
Pt is calling back stating that the fax number that was given was wrong the right fax is [phone]  phone number [phone] for [facility]. Pt would like a call back regarding when the orders are faxed over

## 2021-08-03 ENCOUNTER — Other Ambulatory Visit: Payer: Self-pay

## 2021-08-09 NOTE — Telephone Encounter (Signed)
Made an copy of the order of the order for labs that was faxed to [facility] to give to pt , to take with her to Hospital to have lab drawn.

## 2021-08-09 NOTE — Telephone Encounter (Signed)
Patient came in upset, stating that [facility] did not receive the labs that were requested to be faxed nor were they able to see lab orders  in epic.

## 2021-08-10 ENCOUNTER — Other Ambulatory Visit (HOSPITAL_COMMUNITY)
Admission: RE | Admit: 2021-08-10 | Discharge: 2021-08-10 | Disposition: A | Discharge status: Home / Self Care | Payer: Medicare Other | Source: Ambulatory Visit | Attending: Endocrinology | Admitting: Endocrinology

## 2021-08-10 DIAGNOSIS — E890 Postprocedural hypothyroidism: Secondary | ICD-10-CM | POA: Insufficient documentation

## 2021-08-10 LAB — T4, FREE: Free T4: 1.4 ng/dL — ABNORMAL HIGH (ref 0.61–1.12)

## 2021-08-10 LAB — TSH: TSH: 1.913 u[IU]/mL (ref 0.350–4.500)

## 2021-08-14 NOTE — Progress Notes (Signed)
Please call to let patient know that the thyroid results are normal and no further action needed.  Reminder not to take any biotin containing vitamins at least a week before her next labs

## 2021-08-21 DIAGNOSIS — Z20822 Contact with and (suspected) exposure to covid-19: Secondary | ICD-10-CM | POA: Diagnosis not present

## 2021-08-31 ENCOUNTER — Telehealth: Payer: Self-pay | Admitting: Allergy & Immunology

## 2021-08-31 MED ORDER — ALBUTEROL SULFATE HFA 108 (90 BASE) MCG/ACT IN AERS: 1.0000 | INHALATION_SPRAY | Freq: Four times a day (QID) | RESPIRATORY_TRACT | 1 refills | Status: DC | PRN

## 2021-08-31 NOTE — Telephone Encounter (Signed)
Refill sent in for albuterol to [facility]

## 2021-08-31 NOTE — Telephone Encounter (Signed)
Patient called and needs to have ventolin HFA 108 inhaler called into [facility] . [phone]. Today if you can.

## 2021-09-03 DIAGNOSIS — Z20822 Contact with and (suspected) exposure to covid-19: Secondary | ICD-10-CM | POA: Diagnosis not present

## 2021-09-18 DIAGNOSIS — U071 COVID-19: Secondary | ICD-10-CM | POA: Diagnosis not present

## 2021-10-07 ENCOUNTER — Encounter: Payer: Self-pay | Admitting: Gastroenterology

## 2021-10-07 NOTE — Progress Notes (Signed)
Referring Provider: Celene Squibb, MD Primary Care Physician:  Celene Squibb, MD Primary GI Physician: Dr. Abbey Chatters  Chief Complaint  Patient presents with   Gastroesophageal Reflux    Ok, wants to know if med can be reduced   Dysphagia    Takes awhile to eat     HPI:   Christine Leblanc is a 87 y.o. female presenting today for follow-up.  She has history of chronic GERD and dysphagia.  Last seen in our office 04/06/2021.  She reported chronic issues with dysphagia.  Reported cutting her food up into small bites, eating slowly, and taking a long time to consume an entire meal.  Noted weight loss to around 107 pounds from baseline of 115 pounds.  She attributed this to not eating as much as it takes her so long to eat.  Also with occasional abdominal pain if she bends over.  GERD well controlled.  Bowels moving well without alarm symptoms.  No other GI complaints.  Reinforced dysphagia precautions, advised to continue omeprazole, and recommended adding protein shakes twice daily for weight loss.  Today:  GERD: Well controlled on omeprazole 40 mg daily. Wants to decrease to once daily.  Denies nausea, vomiting, abdominal pain.  Dysphagia: Chronic. Takes her a while to eat.  She has trouble with the action of swallowing.  No trouble with foods getting hung in her esophagus.  States she tucks her chin and turns her head slightly to the left to prevent foods/liquids from going down the wrong way. These are recommendations previously provided by speech therapy. No change in symptoms.  Does not feel she needs to see speech therapy again.  Had covid about 1 month ago. Just starting to feel better. Has still been feeling fatigued.  Was eating less when she had COVID. States she didn't feel like doing anything.    Down about 2 lbs in the last 6 months. Didn't add protein shakes previously recommended. Eats 3 meals a day.   Bowels are moving well with senna daily. No brbpr. Occasional dark stools on  iron.   Past Medical History:  Diagnosis Date   Anemia    Asthma    Cancer ([place])    skin Ca- ? basal cell    Chronic back pain    Chronic neck pain    Degenerative joint disease    Left shoulder; cervical spine, knees & hands    Gastroesophageal reflux disease    Hiatal hernia; distal esophageal web requiring dilatation; gastric polyps; gastritis; refuses colonoscopy   GERD (gastroesophageal reflux disease)    History of stress test 1990's   stress test done under the care of Dr. Lattie Haw & Dr. Gwenlyn Found, now being followed by Dr. Harl Bowie- in [place] , recently seen & told to f/U in one yr.    Hyperlipidemia 04/27/2011   Hypertension    Hypothyroidism    Lymphocytic thyroiditis    Mild carotid artery disease ([place])    Multiple thyroid nodules 2010   Adenomatous; thyroidectomy in 2010   Orthostasis    Osteoarthritis of right knee 07/27/2014   Pneumonia    hosp. for pneumonia- long time ago    Primary localized osteoarthrosis of right shoulder 06/21/2015   Seizures ([place])    yes- as a child- & into adult years, states she took med. for them at one time, stopped at 32 yrs. of age    Syncope and collapse 2007   Possible CVA in 2012 with left lower  extremity weakness; refused hospitalization; CT-Atrophy and chronic microvascular ischemic change.     Past Surgical History:  Procedure Laterality Date   ABDOMINAL HYSTERECTOMY     fibroids   CATARACT EXTRACTION     Bilateral; redo surgery on the right for incomplete primary procedure   COLONOSCOPY  Remote   ESOPHAGOGASTRODUODENOSCOPY  11/2009   Dr. Oneida Alar: probable distal web s/p dilation small hiatal hernia/gastric polyps/mild gastritis, appeared to have narrowing at the junction of D1 and D2 dilated up to 12 mm   ESOPHAGOGASTRODUODENOSCOPY N/A 03/21/2015   Surgeon: Danie Binder, MD; proximal esophageal web s/p dilation, multiple gastric polyps s/p multiple biopsies, mild erosive gastritis s/p biopsy.  Pathology with fundic gland  polyps, chronic gastritis, negative for H. pylori.   EYE SURGERY     PROLAPSED UTERINE FIBROID LIGATION     outcomed with rectocele & cystocele   SAVORY DILATION N/A 03/21/2015   Procedure: SAVORY DILATION;  Surgeon: Danie Binder, MD;  [facility];  Service: Endoscopy;  Laterality: N/A;   SHOULDER SURGERY     left   TOTAL KNEE ARTHROPLASTY Right 07/27/2014   Procedure: RIGHT TOTAL KNEE ARTHROPLASTY;  Surgeon: Johnny Bridge, MD;  Location: [facility];  Service: Orthopedics;  Laterality: Right;   TOTAL SHOULDER ARTHROPLASTY Right 06/21/2015   Procedure: RIGHT TOTAL SHOULDER ARTHROPLASTY;  Surgeon: Marchia Bond, MD;  Location: [facility];  Service: Orthopedics;  Laterality: Right;   TOTAL THYROIDECTOMY  2010    Current Outpatient Medications  Medication Sig Dispense Refill   albuterol (VENTOLIN HFA) 108 (90 Base) MCG/ACT inhaler Inhale 1-2 puffs into the lungs every 6 (six) hours as needed for wheezing or shortness of breath. Wheezing, Asthma Symptoms 8 g 1   aspirin EC 81 MG tablet Take 81 mg by mouth daily. Swallow whole.     CALCIUM-VITAMIN D PO Take by mouth. Calcium 600 mg and Vitamin D 1000 unit. One tablet daily     Carbinoxamine Maleate 4 MG TABS Take 1 tablet (4 mg total) by mouth every 8 (eight) hours. (Patient taking differently: Take 1 tablet by mouth at bedtime.) 270 tablet 2   diazepam (VALIUM) 5 MG tablet Take one tablet by mouth at bedtime as needed for sleep 30 tablet 5   EPINEPHrine 0.3 mg/0.3 mL IJ SOAJ injection Inject into the muscle as needed for anaphylaxis.     ferrous sulfate 325 (65 FE) MG tablet Take 325 mg by mouth daily with breakfast.     fluticasone (FLONASE) 50 MCG/ACT nasal spray Place 1-2 sprays into both nostrils daily as needed for allergies or rhinitis.     Glucosamine-Chondroit-Vit C-Mn (GLUCOSAMINE 1500 COMPLEX PO) Take by mouth. Takes 1500mg  with Chonroitin 1200mg  once daily arthritis pain relief- 8 hr caplet once as needed.     ipratropium  (ATROVENT) 0.03 % nasal spray Place 2 sprays into both nostrils daily as needed (allergies stuffy nose).      levocetirizine (XYZAL) 5 MG tablet Take 5 mg by mouth every evening.     pravastatin (PRAVACHOL) 40 MG tablet TAKE 2 TABLETS DAILY 180 tablet 3   senna (SENOKOT) 8.6 MG tablet Take 2 tablets by mouth at bedtime.      vitamin B-12 (CYANOCOBALAMIN) 1000 MCG tablet Take 1,000 mcg by mouth every morning.     vitamin C (ASCORBIC ACID) 500 MG tablet Take 500 mg by mouth daily.     levothyroxine (SYNTHROID) 75 MCG tablet TAKE 1 TABLET DAILY ON MONDAY THROUGH SATURDAY AND ONE-HALF (  1/2) TABLET ON SUNDAY (Patient not taking: Reported on 10/09/2021) 78 tablet 3   No current facility-administered medications for this visit.    Allergies as of 10/09/2021 - Review Complete 10/09/2021  Allergen Reaction Noted   Celecoxib Shortness Of Breath    Dexilant [dexlansoprazole] Anaphylaxis 05/04/2016   Famotidine  11/04/2018    Family History  Problem Relation Age of Onset   Heart disease Mother    Gallbladder disease Mother    Heart failure Mother    Diabetes Mother    Breast cancer Sister        30  years ago   Diabetes Sister    Heart attack Father    Diabetes Maternal Grandmother    Colon cancer Neg Hx    Stroke Neg Hx     Social History   Socioeconomic History   Marital status: Widowed    Spouse name: Not on file   Number of children: 4   Years of education: 5   Highest education level: Not on file  Occupational History   Occupation: Artist    Comment: does not yield regular income   Occupation: Retired    Comment: Marine scientist  Tobacco Use   Smoking status: Former    Packs/day: 0.80    Years: 20.00    Pack years: 16.00    Types: Cigarettes    Quit date: 12/31/1978    Years since quitting: 42.8   Smokeless tobacco: Never  Vaping Use   Vaping Use: Never used  Substance and Sexual Activity   Alcohol use: No   Drug use: No   Sexual activity: Never    Birth control/protection:  Surgical    Comment: widowed since 2010  Other Topics Concern   Not on file  Social History Narrative   Lives at home. Her daughter lives with her.    Caffeine use: 1 cup coffee per day   Social Determinants of Health   Financial Resource Strain: Not on file  Food Insecurity: Not on file  Transportation Needs: Not on file  Physical Activity: Not on file  Stress: Not on file  Social Connections: Not on file    Review of Systems: Gen: Denies fever, chills, cold or flulike symptoms, presyncope, syncope. CV: Denies chest pain, palpitations. Resp: Denies dyspnea or cough.  GI: See HPI Heme: See HPI  Physical Exam: BP 136/73   Pulse 91   Temp 97.7 F (36.5 C) (Temporal)   Ht 5\' 2"  (1.575 m)   Wt 105 lb 3.2 oz (47.7 kg)   BMI 19.24 kg/m  General:   Alert and oriented. No distress noted. Pleasant and cooperative. Thin.  Head:  Normocephalic and atraumatic. Eyes:  Conjuctiva clear without scleral icterus. Heart:  S1, S2 present without murmurs appreciated. Lungs:  Clear to auscultation bilaterally. No wheezes, rales, or rhonchi. No distress.  Abdomen:  +BS, soft, non-tender and non-distended. No rebound or guarding. No HSM or masses noted. Msk:  Symmetrical without gross deformities. Normal posture. Extremities:  Without edema. Neurologic:  Alert and  oriented x4 Psych:  Normal mood and affect.    Assessment: 87 year old female presenting today for follow-up on dysphagia and GERD.  Also discussed weight loss.  GERD: Well-controlled on omeprazole 40 mg daily.  Requesting to decrease her dose after she completes her current prescription.  Dysphagia:  Describes oropharyngeal symptoms including trouble with the action of swallowing requiring eating slowly, tucking her chin and turning her head slightly to the left to prevent foods from going into  her lungs.  With these mechanisms in addition to eating soft textures, chewing thoroughly, and swallowing more forcefully, she does  well.  Denies any worsening of her symptoms and denies esophageal dysphagia symptoms.  Modified barium swallow in 2017 with speech therapy revealed mild oral phase and moderate/severe pharyngeal phase dysphagia with both sensory and motor deficits.  She completed a few rounds of therapy thereafter which was helpful and does not feel she needs to see speech therapy again at this time.   Weight loss: Noted weight loss from baseline of around 112-115 lbs in 2021 down to 207 pounds in April 2022 thought to be secondary to prolonged eating times due to dysphagia resulting in decreased oral intake.   Down to 105 lbs today which may be influenced by COVID-19 infection 1 month ago as she reports decreased oral intake during this time. She is finally getting back to her baseline.  Eating 3 meals a day.  She did not add protein shakes as Dr. Abbey Chatters previously recommended.  Encouraged that she add 1-2 protein shakes daily.  Continue to monitor.  May need additional work-up if she continues to lose weight.   Plan:  Will reduce omeprazole to 20 mg daily once she finishes her current prescription of omeprazole 40 mg daily.  Patient will call back for a new prescription. Continue with swallowing precautions given by speech therapy including soft diet, chewing thoroughly, tucking chin, swallowing forcefully.  She will let us know if she feels she needs a referral back to speech therapy. To help maintain weight, continue with 3 meals daily and add 1-2 protein shakes daily. If weight loss continues, may need further evaluation. Follow-up in 4 months.   Aliene Altes, PA-C [facility] 10/09/2021

## 2021-10-09 ENCOUNTER — Encounter: Payer: Self-pay | Admitting: Gastroenterology

## 2021-10-09 ENCOUNTER — Other Ambulatory Visit: Payer: Self-pay

## 2021-10-09 ENCOUNTER — Ambulatory Visit (INDEPENDENT_AMBULATORY_CARE_PROVIDER_SITE_OTHER): Payer: Medicare Other | Admitting: Gastroenterology

## 2021-10-09 VITALS — BP 136/73 | HR 91 | Temp 97.7°F | Ht 62.0 in | Wt 105.2 lb

## 2021-10-09 DIAGNOSIS — R634 Abnormal weight loss: Secondary | ICD-10-CM | POA: Diagnosis not present

## 2021-10-09 DIAGNOSIS — R1313 Dysphagia, pharyngeal phase: Secondary | ICD-10-CM | POA: Insufficient documentation

## 2021-10-09 DIAGNOSIS — K219 Gastro-esophageal reflux disease without esophagitis: Secondary | ICD-10-CM

## 2021-10-09 NOTE — Patient Instructions (Signed)
You may continue your current dose of omeprazole 40 mg daily.  When she get closer to the end of your current prescription, please call us back and request a new prescription for 20 mg as you would like to try to reduce the dose.  Continue with your swallowing precautions including taking small bites, chewing thoroughly, avoiding tough textures, tucking your chin, and swallowing forcefully.  If you feel you need a referral back to speech therapy, please let us know.  To help maintain your weight: Continue eating 3 meals per day. Add 1-2 protein shakes daily.  I recommend boost or Ensure formulations that contain 20+ grams of protein per shake.  Follow-up in about 4 months.  Do not hesitate to call if you have questions or concerns prior to your next visit.  It was a pleasure meeting you today!  Aliene Altes, PA-C [facility]

## 2021-10-11 DIAGNOSIS — H40013 Open angle with borderline findings, low risk, bilateral: Secondary | ICD-10-CM | POA: Diagnosis not present

## 2021-10-11 DIAGNOSIS — H21233 Degeneration of iris (pigmentary), bilateral: Secondary | ICD-10-CM | POA: Diagnosis not present

## 2021-10-11 DIAGNOSIS — H52203 Unspecified astigmatism, bilateral: Secondary | ICD-10-CM | POA: Diagnosis not present

## 2021-10-11 DIAGNOSIS — H18593 Other hereditary corneal dystrophies, bilateral: Secondary | ICD-10-CM | POA: Diagnosis not present

## 2021-11-10 ENCOUNTER — Telehealth: Payer: Self-pay

## 2021-11-10 ENCOUNTER — Other Ambulatory Visit: Payer: Self-pay | Admitting: Gastroenterology

## 2021-11-10 DIAGNOSIS — K219 Gastro-esophageal reflux disease without esophagitis: Secondary | ICD-10-CM

## 2021-11-10 MED ORDER — OMEPRAZOLE MAGNESIUM 20 MG PO TBEC: 20.0000 mg | DELAYED_RELEASE_TABLET | Freq: Every day | ORAL | 3 refills | Status: DC

## 2021-11-10 NOTE — Telephone Encounter (Signed)
Yes.  New prescription sent to pharmacy.

## 2021-11-10 NOTE — Telephone Encounter (Signed)
Pt called and was stating that she wanted to be on Omeprazole DR 20 mg instead of the 40 mg. She said this was a discussion that you had with her during her last visit. The Rx can be sent to [facility].

## 2021-11-13 ENCOUNTER — Telehealth: Payer: Self-pay | Admitting: Endocrinology

## 2021-11-13 DIAGNOSIS — E890 Postprocedural hypothyroidism: Secondary | ICD-10-CM

## 2021-11-13 MED ORDER — LEVOTHYROXINE SODIUM 75 MCG PO TABS: ORAL_TABLET | ORAL | 3 refills | Status: DC

## 2021-11-13 NOTE — Telephone Encounter (Signed)
Pt calling in to requests a refill of levothyroxine (SYNTHROID) 75 MCG tablet   [place], [place]  [address] [place] [address]   Pt contact [phone]

## 2021-11-13 NOTE — Telephone Encounter (Signed)
Noted  

## 2021-11-13 NOTE — Telephone Encounter (Signed)
FYI: phoned and advised the pt of new Rx being  sent to [facility] for Omeprazole DR 20 mg.

## 2021-11-14 MED ORDER — LEVOTHYROXINE SODIUM 75 MCG PO TABS: ORAL_TABLET | ORAL | 3 refills | Status: DC

## 2021-11-14 NOTE — Addendum Note (Signed)
Addended by: Sarina Ill on: 11/14/2021 08:55 AM   Modules accepted: Orders

## 2021-11-20 ENCOUNTER — Ambulatory Visit (INDEPENDENT_AMBULATORY_CARE_PROVIDER_SITE_OTHER): Payer: Medicare Other | Admitting: Endocrinology

## 2021-11-20 ENCOUNTER — Other Ambulatory Visit: Payer: Self-pay

## 2021-11-20 ENCOUNTER — Encounter: Payer: Self-pay | Admitting: Endocrinology

## 2021-11-20 VITALS — BP 140/78 | HR 86 | Ht 62.0 in | Wt 106.0 lb

## 2021-11-20 DIAGNOSIS — E890 Postprocedural hypothyroidism: Secondary | ICD-10-CM

## 2021-11-20 NOTE — Progress Notes (Signed)
Christine Leblanc 87 y.o.    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  She has been followed every 6-12 months for her thyroid supplementation and monitoring of her levels However has not been seen in follow-up since 9/18  She previously had been on 100 mcg since 03/2014  This was subsequently reduced down to 75 g  She takes levothyroxine 75 mcg, 6-1/2 tablets a week with no need to change her dose for some time  She was last seen 6 months ago and had labs also done in August when she was complaining of feeling excessively cold and occasional night sweats  She thinks she feels fair with no new symptoms and no change in her functioning level No recent weight change  She is still on generic levothyroxine from [facility] She is quite regular with the levothyroxine before eating in the morning as before As before she takes Prilosec daily in the mornings also  She aware of the need to take iron and calcium supplements  at least 2 hours after her levothyroxine dose  TSH on her last visit was normal She periodically has a high free T4 level   Wt Readings from Last 3 Encounters:  11/20/21 106 lb (48.1 kg)  10/09/21 105 lb 3.2 oz (47.7 kg)  05/22/21 106 lb 4 oz (48.2 kg)   Lab Results  Component Value Date   TSH 1.913 08/10/2021   TSH 1.69 05/22/2021   TSH 1.38 06/17/2020   FREET4 1.40 (H) 08/10/2021   FREET4 1.49 05/22/2021   FREET4 1.73 (H) 04/26/2020       Allergies as of 11/20/2021       Reactions   Celecoxib Shortness Of Breath   Dexilant [dexlansoprazole] Anaphylaxis   abd pain   Famotidine    Makes her feel "very bad"        Medication List        Accurate as of November 20, 2021  3:41 PM. If you have any  questions, ask your nurse or doctor.          albuterol 108 (90 Base) MCG/ACT inhaler Commonly known as: VENTOLIN HFA Inhale 1-2 puffs into the lungs every 6 (six) hours as needed for wheezing or shortness of breath. Wheezing, Asthma Symptoms   aspirin EC 81 MG tablet Take 81 mg by mouth daily. Swallow whole.   CALCIUM-VITAMIN D PO Take by mouth. Calcium 600 mg and Vitamin D 1000 unit. One tablet daily   Carbinoxamine Maleate 4 MG Tabs Take 1 tablet (4 mg total) by mouth every 8 (eight) hours. What changed: when to take this   diazepam 5 MG tablet Commonly known as: VALIUM Take one tablet by mouth at bedtime as needed for sleep   EPINEPHrine 0.3 mg/0.3 mL Soaj injection Commonly known as: EPI-PEN Inject into the muscle as needed for anaphylaxis.   ferrous sulfate 325 (65 FE) MG tablet Take 325 mg by mouth daily with breakfast.   fluticasone 50 MCG/ACT nasal spray Commonly known as: FLONASE Place 1-2 sprays into both nostrils daily as needed for allergies or rhinitis.   GLUCOSAMINE 1500 COMPLEX PO Take by mouth. Takes 1500mg  with Chonroitin 1200mg  once daily arthritis pain relief- 8 hr caplet once as needed.   ipratropium 0.03 %  nasal spray Commonly known as: ATROVENT Place 2 sprays into both nostrils daily as needed (allergies stuffy nose).   levocetirizine 5 MG tablet Commonly known as: XYZAL Take 5 mg by mouth every evening.   levothyroxine 75 MCG tablet Commonly known as: SYNTHROID TAKE 1 TABLET DAILY ON MONDAY THROUGH SATURDAY AND ONE-HALF (1/2) TABLET ON SUNDAY   omeprazole 20 MG tablet Commonly known as: PriLOSEC OTC Take 1 tablet (20 mg total) by mouth daily before breakfast.   pravastatin 40 MG tablet Commonly known as: PRAVACHOL TAKE 2 TABLETS DAILY   senna 8.6 MG tablet Commonly known as: SENOKOT Take 2 tablets by mouth at bedtime.   vitamin B-12 1000 MCG tablet Commonly known as: CYANOCOBALAMIN Take 1,000 mcg by mouth every morning.    vitamin C 500 MG tablet Commonly known as: ASCORBIC ACID Take 500 mg by mouth daily.        Allergies:  Allergies  Allergen Reactions   Celecoxib Shortness Of Breath   Dexilant [Dexlansoprazole] Anaphylaxis    abd pain   Famotidine     Makes her feel "very bad"    Past Medical History:  Diagnosis Date   Anemia    Asthma    Cancer ([place])    skin Ca- ? basal cell    Chronic back pain    Chronic neck pain    Degenerative joint disease    Left shoulder; cervical spine, knees & hands    Gastroesophageal reflux disease    Hiatal hernia; distal esophageal web requiring dilatation; gastric polyps; gastritis; refuses colonoscopy   GERD (gastroesophageal reflux disease)    History of stress test 1990's   stress test done under the care of Dr. Lattie Haw & Dr. Gwenlyn Found, now being followed by Dr. Harl Bowie- in [place] , recently seen & told to f/U in one yr.    Hyperlipidemia 04/27/2011   Hypertension    Hypothyroidism    Lymphocytic thyroiditis    Mild carotid artery disease ([place])    Multiple thyroid nodules 2010   Adenomatous; thyroidectomy in 2010   Orthostasis    Osteoarthritis of right knee 07/27/2014   Pneumonia    hosp. for pneumonia- long time ago    Primary localized osteoarthrosis of right shoulder 06/21/2015   Seizures ([place])    yes- as a child- & into adult years, states she took med. for them at one time, stopped at 32 yrs. of age    Syncope and collapse 2007   Possible CVA in 2012 with left lower extremity weakness; refused hospitalization; CT-Atrophy and chronic microvascular ischemic change.     Past Surgical History:  Procedure Laterality Date   ABDOMINAL HYSTERECTOMY     fibroids   CATARACT EXTRACTION     Bilateral; redo surgery on the right for incomplete primary procedure   COLONOSCOPY  Remote   ESOPHAGOGASTRODUODENOSCOPY  11/2009   Dr. Oneida Alar: probable distal web s/p dilation small hiatal hernia/gastric polyps/mild gastritis, appeared to have narrowing at  the junction of D1 and D2 dilated up to 12 mm   ESOPHAGOGASTRODUODENOSCOPY N/A 03/21/2015   Surgeon: Danie Binder, MD; proximal esophageal web s/p dilation, multiple gastric polyps s/p multiple biopsies, mild erosive gastritis s/p biopsy.  Pathology with fundic gland polyps, chronic gastritis, negative for H. pylori.   EYE SURGERY     PROLAPSED UTERINE FIBROID LIGATION     outcomed with rectocele & cystocele   SAVORY DILATION N/A 03/21/2015   Procedure: SAVORY DILATION;  Surgeon: Danie Binder, MD;  [facility];  Service: Endoscopy;  Laterality: N/A;   SHOULDER SURGERY     left   TOTAL KNEE ARTHROPLASTY Right 07/27/2014   Procedure: RIGHT TOTAL KNEE ARTHROPLASTY;  Surgeon: Johnny Bridge, MD;  Location: [facility];  Service: Orthopedics;  Laterality: Right;   TOTAL SHOULDER ARTHROPLASTY Right 06/21/2015   Procedure: RIGHT TOTAL SHOULDER ARTHROPLASTY;  Surgeon: Marchia Bond, MD;  Location: [facility];  Service: Orthopedics;  Laterality: Right;   TOTAL THYROIDECTOMY  2010    Family History  Problem Relation Age of Onset   Heart disease Mother    Gallbladder disease Mother    Heart failure Mother    Diabetes Mother    Breast cancer Sister        22 years ago   Diabetes Sister    Heart attack Father    Diabetes Maternal Grandmother    Colon cancer Neg Hx    Stroke Neg Hx     Social History:  reports that she quit smoking about 42 years ago. Her smoking use included cigarettes. She has a 16.00 pack-year smoking history. She has never used smokeless tobacco. She reports that she does not drink alcohol and does not use drugs.  REVIEW Of SYSTEMS:  ROS  Weight History:  Wt Readings from Last 3 Encounters:  11/20/21 106 lb (48.1 kg)  10/09/21 105 lb 3.2 oz (47.7 kg)  05/22/21 106 lb 4 oz (48.2 kg)    History of hypercholesterolemia followed by PCP, treated with pravastatin 40 mg   She has chronic difficulty swallowing and is on Prilosec for reflux followed by GI       Examination:   BP 140/78 (BP Location: Left Arm, Patient Position: Sitting, Cuff Size: Normal)   Pulse 86   Ht 5\' 2"  (1.575 m)   Wt 106 lb (48.1 kg)   SpO2 99%   BMI 19.39 kg/m   Thyroid not palpable  No tremor Biceps reflexes show relatively slow relaxation Skin appears normal, not unusually warm or cold      Assessments   Hypothyroidism, postsurgical and long-standing  She has been taking 75 mcg levothyroxine, 6-1/2 tablets a week as a stable dose  She is doing well subjectively although difficult to assess with her other chronic problems No significant weight fluctuation lately  Her TSH last was normal in August  She takes her levothyroxine consistently as directed before breakfast   Labs to be done today, to decide on the dose and follow-up in 6 months if no changes made     There are no Patient Instructions on file for this visit.   Elayne Snare 11/20/2021, 3:41 PM

## 2021-11-21 LAB — T4, FREE: Free T4: 1.28 ng/dL (ref 0.60–1.60)

## 2021-11-21 LAB — TSH: TSH: 11.18 u[IU]/mL — ABNORMAL HIGH (ref 0.35–5.50)

## 2021-11-22 ENCOUNTER — Other Ambulatory Visit: Payer: Self-pay

## 2021-11-22 ENCOUNTER — Telehealth: Payer: Self-pay

## 2021-11-22 DIAGNOSIS — E890 Postprocedural hypothyroidism: Secondary | ICD-10-CM

## 2021-11-22 MED ORDER — LEVOTHYROXINE SODIUM 88 MCG PO TABS: ORAL_TABLET | ORAL | 3 refills | Status: DC

## 2021-11-22 NOTE — Telephone Encounter (Signed)
-----   Message from Elayne Snare, MD sent at 11/22/2021  8:10 AM EST ----- Please call to let patient know that the thyroid level is low.  Need to change her levothyroxine to 88 mcg daily instead of 75 and have her come back in follow-up in 3 months instead of 6 months

## 2021-11-22 NOTE — Telephone Encounter (Signed)
Patient has now been informed of her thyroid results, as well as to change levothyroxine from 75 to 48mcg. Patient's appt has also been rescheduled to 02/22/22 @1 :15 p.m.

## 2021-11-22 NOTE — Progress Notes (Signed)
Please call to let patient know that the thyroid level is low.  Need to change her levothyroxine to 88 mcg daily instead of 75 and have her come back in follow-up in 3 months instead of 6 months

## 2021-11-22 NOTE — Telephone Encounter (Signed)
Strength changed from 73mcg to 65mcg per provider

## 2021-12-13 ENCOUNTER — Other Ambulatory Visit: Payer: Self-pay

## 2021-12-13 ENCOUNTER — Ambulatory Visit (INDEPENDENT_AMBULATORY_CARE_PROVIDER_SITE_OTHER): Payer: Medicare Other | Admitting: Allergy & Immunology

## 2021-12-13 ENCOUNTER — Encounter: Payer: Self-pay | Admitting: Allergy & Immunology

## 2021-12-13 VITALS — BP 130/68 | HR 91 | Temp 98.3°F | Resp 18 | Ht 62.0 in | Wt 106.0 lb

## 2021-12-13 DIAGNOSIS — J4540 Moderate persistent asthma, uncomplicated: Secondary | ICD-10-CM | POA: Diagnosis not present

## 2021-12-13 DIAGNOSIS — J310 Chronic rhinitis: Secondary | ICD-10-CM | POA: Diagnosis not present

## 2021-12-13 MED ORDER — IPRATROPIUM BROMIDE 0.06 % NA SOLN: 2.0000 | Freq: Three times a day (TID) | NASAL | 1 refills | Status: DC

## 2021-12-13 MED ORDER — IPRATROPIUM BROMIDE 0.06 % NA SOLN: 2.0000 | Freq: Three times a day (TID) | NASAL | 5 refills | Status: DC

## 2021-12-13 MED ORDER — TRELEGY ELLIPTA 100-62.5-25 MCG/ACT IN AEPB: 1.0000 | INHALATION_SPRAY | Freq: Every day | RESPIRATORY_TRACT | 1 refills | Status: DC

## 2021-12-13 NOTE — Progress Notes (Signed)
FOLLOW UP  Date of Service/Encounter:  12/13/21   Assessment:   Chronic rhinitis - s/p 30+ years allergen immunotherapy   Moderate persistent asthma - with 70% improvement in her symptoms  Plan/Recommendations:   1. Chronic rhinitis - with negative testing to the entire panel - We are going to continue with carbinoxamine 4mg  every 8 hours as needed. - Try using it during the day on a day when you are not planning to go anywhere to see if you even develop the sleepiness.  - Continue fluticasone nasal spray 1 spray each nostril once a day as needed for stuffy nose - Continue ipratropium bromide nasal spray 1-2 sprays each nostril two to three times a day as needed for runny/drippy nose  2. Mild persistent asthma, uncomplicated  - Lung testing was in the 41% range, but it improved to 70% with the albuterol treatment.  - Because of that, I think you would benefit from the daily use of an inhaled asthma medication. - Samples of Trelegy provided, which contains THREE medicines to help with inflammation in your lungs and will help to keep them open. - Wash your mouth out after each use to prevent thrush. - Daily controller medication(s): Trelegy 100/62.5/25 one puff once daily - Prior to physical activity: albuterol 2 puffs 10-15 minutes before physical activity. - Rescue medications: albuterol 4 puffs every 4-6 hours as needed - Asthma control goals:  * Full participation in all desired activities (may need albuterol before activity) * Albuterol use two time or less a week on average (not counting use with activity) * Cough interfering with sleep two time or less a month * Oral steroids no more than once a year * No hospitalizations  3. Return to clinic in 6 months or earlier if needed.    Subjective:   Christine Leblanc is a 87 y.o. female presenting today for follow up of  Chief Complaint  Patient presents with   Chronic Rhinitis    6 mth f/u - about the same    Christine Leblanc has a history of the following: Patient Active Problem List   Diagnosis Date Noted   Loss of weight 10/09/2021   Pharyngeal dysphagia 10/09/2021   Mild carotid artery disease ([place]) 05/19/2020   Near syncope 12/26/2018   History of TIA (transient ischemic attack) 12/25/2018   Neck pain    Chest pain 09/30/2018   Generalized weakness 09/30/2018   Hypokalemia    MGUS (monoclonal gammopathy of unknown significance) 10/21/2017   Primary localized osteoarthrosis of right shoulder 06/21/2015   S/P shoulder replacement 06/21/2015   Dysphagia, idiopathic 11/29/2014   Osteoarthritis of right knee 07/27/2014   Postsurgical hypothyroidism 04/22/2014   Hypothyroidism 10/12/2013   Syncope and collapse    Anemia, normocytic normochromic 04/26/2012   Gastroesophageal reflux disease    Essential hypertension 12/07/2011   Leg pain, bilateral 04/27/2011   Dyslipidemia 04/27/2011   Other dysphagia 06/07/2010    History obtained from: chart review and patient.  Jajaira is a 87 y.o. female presenting for a follow up visit. She was last seen in June 2022 for management of chronic rhinitis. We continued with the use of carbinoxamine 4mg  every 8 hours as needed in combination with fluticasone and ipratropium.   Since the last visit, she has done well. She thinks that she is doing as well as can be expected. She has had no sinus infections. She did have COVID in September and she thinks that she picked it  up in an airport. She went on a cruise with her daughter. Her daughter is disabled. But her son is in security in [place]. The system is coming across from [place].   Asthma/Respiratory Symptom History: She has albuterol to use as needed. She does not use it at all really. She did have asthma when she was younger but it has gradually gotten better. To feel that her symptoms have not been an issue.  She has not needed albuterol in quite some time.  She has not been on a controller medication ever to her  knowledge.  She has not been on prednisone nor has she been to the emergency room for her breathing.  She has never done a breathing test.  Allergic Rhinitis Symptom History: She is reporting that she only using one tablet a day. She is afraid of the sleepiness and tries to limit it. She takes it at night. She is unsure if it causes sleepiness.  She does report that she has over dryness of her mouth.  She says this leads to a lot of mucus, especially in the morning, which is dry and irritating to her.  GERD Symptom History: She apparently has a history of dysphagia.   Otherwise, there have been no changes to her past medical history, surgical history, family history, or social history.    Review of Systems  Constitutional: Negative.  Negative for chills, fever, malaise/fatigue and weight loss.  HENT:  Positive for congestion. Negative for ear discharge and ear pain.   Eyes:  Negative for pain, discharge and redness.  Respiratory:  Positive for cough. Negative for sputum production, shortness of breath and wheezing.   Cardiovascular: Negative.  Negative for chest pain and palpitations.  Gastrointestinal:  Negative for abdominal pain, constipation, diarrhea, heartburn, nausea and vomiting.  Skin: Negative.  Negative for itching and rash.  Neurological:  Negative for dizziness and headaches.  Endo/Heme/Allergies:  Negative for environmental allergies. Does not bruise/bleed easily.      Objective:   Blood pressure 130/68, pulse 91, temperature 98.3 F (36.8 C), resp. rate 18, height 5\' 2"  (1.575 m), weight 106 lb (48.1 kg), SpO2 97 %. Body mass index is 19.39 kg/m.   Physical Exam:  Physical Exam Vitals reviewed.  Constitutional:      Appearance: She is well-developed.     Comments: Very friendly.  HENT:     Head: Normocephalic and atraumatic.     Right Ear: Tympanic membrane, ear canal and external ear normal.     Left Ear: Tympanic membrane, ear canal and external ear normal.      Nose: No nasal deformity, septal deviation, mucosal edema or rhinorrhea.     Right Turbinates: Enlarged and swollen.     Left Turbinates: Enlarged and swollen.     Right Sinus: No maxillary sinus tenderness or frontal sinus tenderness.     Left Sinus: No maxillary sinus tenderness or frontal sinus tenderness.     Comments: Dry mouth.    Mouth/Throat:     Mouth: Mucous membranes are not pale and not dry.     Pharynx: Uvula midline.  Eyes:     General: Lids are normal. No allergic shiner.       Right eye: No discharge.        Left eye: No discharge.     Conjunctiva/sclera: Conjunctivae normal.     Right eye: Right conjunctiva is not injected. No chemosis.    Left eye: Left conjunctiva is not injected. No chemosis.  Pupils: Pupils are equal, round, and reactive to light.  Cardiovascular:     Rate and Rhythm: Normal rate and regular rhythm.     Heart sounds: Normal heart sounds.  Pulmonary:     Effort: Pulmonary effort is normal. No tachypnea, accessory muscle usage or respiratory distress.     Breath sounds: Normal breath sounds. No wheezing, rhonchi or rales.  Chest:     Chest wall: No tenderness.  Lymphadenopathy:     Cervical: No cervical adenopathy.  Skin:    Coloration: Skin is not pale.     Findings: No abrasion, erythema, petechiae or rash. Rash is not papular, urticarial or vesicular.  Neurological:     Mental Status: She is alert.  Psychiatric:        Behavior: Behavior is cooperative.     Diagnostic studies:    Spirometry: results abnormal (FEV1: 0.64/41%, FVC: 1.37/65%, FEV1/FVC: 47%).    Spirometry consistent with mixed obstructive and restrictive disease. Xopenex four puffs via MDI treatment given in clinic with significant improvement in FEV1 per ATS criteria.  FEV1 increased 73%.  Allergy Studies: none       Salvatore Marvel, MD  [facility]

## 2021-12-13 NOTE — Patient Instructions (Addendum)
1. Chronic rhinitis - with negative testing to the entire panel - We are going to continue with carbinoxamine 4mg  every 8 hours as needed. - Try using it during the day on a day when you are not planning to go anywhere to see if you even develop the sleepiness.  - Continue fluticasone nasal spray 1 spray each nostril once a day as needed for stuffy nose - Continue ipratropium bromide nasal spray 1-2 sprays each nostril two to three times a day as needed for runny/drippy nose  2. Mild persistent asthma, uncomplicated  - Lung testing was in the 41% range, but it improved to 70% with the albuterol treatment.  - Because of that, I think you would benefit from the daily use of an inhaled asthma medication. - Samples of Trelegy provided, which contains THREE medicines to help with inflammation in your lungs and will help to keep them open. - Wash your mouth out after each use to prevent thrush. - Daily controller medication(s): Trelegy 100/62.5/25 one puff once daily - Prior to physical activity: albuterol 2 puffs 10-15 minutes before physical activity. - Rescue medications: albuterol 4 puffs every 4-6 hours as needed - Asthma control goals:  * Full participation in all desired activities (may need albuterol before activity) * Albuterol use two time or less a week on average (not counting use with activity) * Cough interfering with sleep two time or less a month * Oral steroids no more than once a year * No hospitalizations  3. Return to clinic in 6 months or earlier if needed.

## 2021-12-15 ENCOUNTER — Encounter: Payer: Self-pay | Admitting: Allergy & Immunology

## 2021-12-16 ENCOUNTER — Emergency Department (HOSPITAL_COMMUNITY): Payer: Medicare Other

## 2021-12-16 ENCOUNTER — Other Ambulatory Visit: Payer: Self-pay

## 2021-12-16 ENCOUNTER — Emergency Department (HOSPITAL_COMMUNITY)
Admission: EM | Admit: 2021-12-16 | Discharge: 2021-12-16 | Disposition: A | Discharge status: Home / Self Care | Payer: Medicare Other | Attending: Emergency Medicine | Admitting: Emergency Medicine

## 2021-12-16 DIAGNOSIS — R519 Headache, unspecified: Secondary | ICD-10-CM

## 2021-12-16 DIAGNOSIS — R0789 Other chest pain: Secondary | ICD-10-CM

## 2021-12-16 DIAGNOSIS — R0689 Other abnormalities of breathing: Secondary | ICD-10-CM | POA: Diagnosis not present

## 2021-12-16 DIAGNOSIS — F419 Anxiety disorder, unspecified: Secondary | ICD-10-CM | POA: Diagnosis not present

## 2021-12-16 DIAGNOSIS — R079 Chest pain, unspecified: Secondary | ICD-10-CM | POA: Diagnosis not present

## 2021-12-16 DIAGNOSIS — I10 Essential (primary) hypertension: Secondary | ICD-10-CM | POA: Diagnosis not present

## 2021-12-16 LAB — CBC WITH DIFFERENTIAL/PLATELET
Abs Immature Granulocytes: 0.03 10*3/uL (ref 0.00–0.07)
Basophils Absolute: 0.1 10*3/uL (ref 0.0–0.1)
Basophils Relative: 1 %
Eosinophils Absolute: 0.4 10*3/uL (ref 0.0–0.5)
Eosinophils Relative: 4 %
HCT: 31.2 % — ABNORMAL LOW (ref 36.0–46.0)
Hemoglobin: 10.1 g/dL — ABNORMAL LOW (ref 12.0–15.0)
Immature Granulocytes: 0 %
Lymphocytes Relative: 11 %
Lymphs Abs: 1 10*3/uL (ref 0.7–4.0)
MCH: 31.2 pg (ref 26.0–34.0)
MCHC: 32.4 g/dL (ref 30.0–36.0)
MCV: 96.3 fL (ref 80.0–100.0)
Monocytes Absolute: 0.8 10*3/uL (ref 0.1–1.0)
Monocytes Relative: 9 %
Neutro Abs: 6.6 10*3/uL (ref 1.7–7.7)
Neutrophils Relative %: 75 %
Platelets: 177 10*3/uL (ref 150–400)
RBC: 3.24 MIL/uL — ABNORMAL LOW (ref 3.87–5.11)
RDW: 14 % (ref 11.5–15.5)
WBC: 8.8 10*3/uL (ref 4.0–10.5)
nRBC: 0 % (ref 0.0–0.2)

## 2021-12-16 LAB — TROPONIN I (HIGH SENSITIVITY)
Troponin I (High Sensitivity): 7 ng/L (ref <18)
Troponin I (High Sensitivity): 7 ng/L (ref <18)

## 2021-12-16 LAB — BASIC METABOLIC PANEL
Anion gap: 7 (ref 5–15)
BUN: 28 mg/dL — ABNORMAL HIGH (ref 8–23)
CO2: 27 mmol/L (ref 22–32)
Calcium: 9 mg/dL (ref 8.9–10.3)
Chloride: 105 mmol/L (ref 98–111)
Creatinine, Ser: 1.11 mg/dL — ABNORMAL HIGH (ref 0.44–1.00)
GFR, Estimated: 47 mL/min — ABNORMAL LOW (ref >60)
Glucose, Bld: 96 mg/dL (ref 70–99)
Potassium: 3.9 mmol/L (ref 3.5–5.1)
Sodium: 139 mmol/L (ref 135–145)

## 2021-12-16 MED ORDER — ACETAMINOPHEN 325 MG PO TABS
650.0000 mg | ORAL_TABLET | Freq: Once | ORAL | Status: AC
  Administered 2021-12-16: 650 mg via ORAL
  Filled 2021-12-16: qty 2

## 2021-12-16 NOTE — ED Provider Notes (Signed)
[facility] Provider Note   CSN: [id number] Arrival date & time: 12/16/21  1540     History Chief Complaint  Patient presents with   Chest Pain    Christine Leblanc is a 87 y.o. female.  HPI  Patient with medical history including anxiety, TIA, hypertension previous smoker presents with chief complaint of left-sided chest pain.  Patient states chest pain started today while she was sitting on the couch, she states that she was thinking of her past and became upset and started to have chest pain.  She denies becoming short of breath, becoming diaphoretic, lightheaded, dizziness, nausea or vomiting.  Describes the chest pain as a tight like sensation just on the left side did not radiate, last about 30 minutes then resolve on its own.  She denies orthopnea or worsening pedal edema, she has no significant cardiac history, no history of PEs or DVTs currently not on hormone therapy.  She also notes that she is having a slight headache, the headaches just in front of her head, states that this headache started during her chest pain, she denies change in vision, paresthesias or weakness in the upper lower extremities, no recent head trauma, is not on anticoagulant, states that she to be does not have a headache and this is new for her.  She has no other complaints at this time denies any alleviating factors.  No past medical history on file.  There are no problems to display for this patient.     OB History   No obstetric history on file.     No family history on file.     Home Medications Prior to Admission medications   Not on File    Allergies    Patient has no allergy information on record.  Review of Systems   Review of Systems  Constitutional:  Negative for chills and fever.  HENT:  Negative for congestion.   Respiratory:  Negative for shortness of breath.   Cardiovascular:  Negative for chest pain, palpitations and leg swelling.  Gastrointestinal:   Negative for abdominal pain, diarrhea, nausea and vomiting.  Genitourinary:  Negative for enuresis.  Musculoskeletal:  Negative for back pain.  Skin:  Negative for rash.  Neurological:  Positive for headaches. Negative for dizziness.  Hematological:  Does not bruise/bleed easily.   Physical Exam Updated Vital Signs BP (!) 156/81 (BP Location: Left Arm)    Pulse 77    Temp 98.1 F (36.7 C) (Oral)    Resp 15    Ht 5\' 2"  (1.575 m)    Wt 48.1 kg    SpO2 99%    BMI 19.39 kg/m   Physical Exam Vitals and nursing note reviewed.  Constitutional:      General: She is not in acute distress.    Appearance: She is not ill-appearing.  HENT:     Head: Normocephalic and atraumatic.     Nose: No congestion.  Eyes:     Extraocular Movements: Extraocular movements intact.     Conjunctiva/sclera: Conjunctivae normal.     Pupils: Pupils are equal, round, and reactive to light.  Cardiovascular:     Rate and Rhythm: Normal rate and regular rhythm.     Pulses: Normal pulses.     Heart sounds: No murmur heard.   No friction rub. No gallop.  Pulmonary:     Effort: No respiratory distress.     Breath sounds: No wheezing, rhonchi or rales.  Abdominal:     Palpations:  Abdomen is soft.     Tenderness: There is no abdominal tenderness. There is no right CVA tenderness or left CVA tenderness.  Skin:    General: Skin is warm and dry.  Neurological:     Mental Status: She is alert.     GCS: GCS eye subscore is 4. GCS verbal subscore is 5. GCS motor subscore is 6.     Cranial Nerves: Cranial nerves 2-12 are intact. No cranial nerve deficit.     Sensory: Sensation is intact.     Motor: No weakness.     Coordination: Romberg sign negative. Finger-Nose-Finger Test normal.     Comments: Cranial nerves II through XII grossly intact, no facial asymmetry, no difficult word finding, able follow two-step commands, no unilateral weakness present, sensation fully intact.  Psychiatric:        Mood and Affect: Mood  normal.    ED Results / Procedures / Treatments   Labs (all labs ordered are listed, but only abnormal results are displayed) Labs Reviewed  BASIC METABOLIC PANEL - Abnormal; Notable for the following components:      Result Value   BUN 28 (*)    Creatinine, Ser 1.11 (*)    GFR, Estimated 47 (*)    All other components within normal limits  CBC WITH DIFFERENTIAL/PLATELET - Abnormal; Notable for the following components:   RBC 3.24 (*)    Hemoglobin 10.1 (*)    HCT 31.2 (*)    All other components within normal limits  TROPONIN I (HIGH SENSITIVITY)  TROPONIN I (HIGH SENSITIVITY)    EKG EKG Interpretation  Date/Time:  Saturday December 16 2021 16:01:27 EST Ventricular Rate:  77 PR Interval:  165 QRS Duration: 129 QT Interval:  416 QTC Calculation: 471 R Axis:   82 Text Interpretation: duplicate Confirmed by Daleen Bo [phone]) on 12/16/2021 4:11:37 PM  Radiology DG Chest 2 View  Result Date: 12/16/2021 CLINICAL DATA:  Chest pain. EXAM: CHEST - 2 VIEW COMPARISON:  November 04, 2018 FINDINGS: The heart size and mediastinal contours are within normal limits. Both lungs are clear. The visualized skeletal structures are unremarkable. IMPRESSION: No active cardiopulmonary disease. Electronically Signed   By: Dorise Bullion III M.D.   On: 12/16/2021 17:03   CT Head Wo Contrast  Result Date: 12/16/2021 CLINICAL DATA:  Headache. EXAM: CT HEAD WITHOUT CONTRAST TECHNIQUE: Contiguous axial images were obtained from the base of the skull through the vertex without intravenous contrast. COMPARISON:  CT head 11/04/2018 FINDINGS: Brain: No evidence of acute infarction, hemorrhage, hydrocephalus, extra-axial collection or mass lesion/mass effect. Generalized parenchymal volume loss with associated dilatation of the ventricles. Hypodensities in the subcortical and periventricular white matter, consistent with chronic small-vessel ischemic changes. Vascular: No hyperdense vessel or  unexpected calcification. Skull: Normal. Negative for fracture or focal lesion. Sinuses/Orbits: No acute finding. Mucous retention cysts in the maxillary sinuses. Moderate mucosal thickening in a few bilateral ethmoid air cells and the left frontal sinus. Other: None. IMPRESSION: No acute intracranial abnormality. Electronically Signed   By: Ileana Roup M.D.   On: 12/16/2021 17:01    Procedures Procedures   Medications Ordered in ED Medications  acetaminophen (TYLENOL) tablet 650 mg (650 mg Oral Given 12/16/21 1728)    ED Course  I have reviewed the triage vital signs and the nursing notes.  Pertinent labs & imaging results that were available during my care of the patient were reviewed by me and considered in my medical decision making (see chart for details).  MDM Rules/Calculators/A&P                         Initial impression-presents with chest pain and a headache, alert, no acute stress, vital signs reassuring.  Unclear etiology will obtain chest pain work-up, CT of head provide her with pain medications reassess.  Work-up-CBC shows normocytic anemia hemoglobin 10.1, BMP shows BUN 20, creatinine 1.11, GFR 47, for troponin is 7 second opponent is 7, chest x-ray unremarkable, CT head negative for acute findings, EKG sinus without signs of ischemia.  Reassessment-patient was reassessed states she is feeling better has no complaints, she does notice that she is has a slight headache will provide Tylenol continue to monitor.  Updated lab work and imaging she has no complaints this time, patient prescription for discharge.  Rule out-low suspicion for intracranial head bleed or mass CT head is negative for acute findings.  Low suspicion for CVA she has no full dose present my exam. I have low suspicion for ACS as history is atypical, patient has no cardiac history, EKG was sinus rhythm without signs of ischemia, patient had a negative delta troponin.  Low suspicion for PE as patient  denies pleuritic chest pain, shortness of breath, patient denies leg pain, no pedal edema noted on exam, patient was PERC negative.  Low suspicion for AAA or aortic dissection as history is atypical, patient has low risk factors.  Low suspicion for systemic infection as patient is nontoxic-appearing, vital signs reassuring, no obvious source infection noted on exam.   Plan-  Chest pain headache since resolved-unclear etiology but I suspect this is likely secondary to anxiety attack, will have her follow-up with her PCP for further evaluation.  Gave strict return precautions.  Vital signs have remained stable, no indication for hospital admission.  Patient discussed with attending and they agreed with assessment and plan.  Patient given at home care as well strict return precautions.  Patient verbalized that they understood agreed to said plan.     Final Clinical Impression(s) / ED Diagnoses Final diagnoses:  Atypical chest pain  Bad headache    Rx / DC Orders ED Discharge Orders     None        Marcello Fennel, PA-C 12/16/21 1926    Daleen Bo, MD 12/17/21 1734

## 2021-12-16 NOTE — ED Triage Notes (Signed)
Patient brought in by [facility] for chest pain.  EMS states that the patient is from home and was at [facility] and got into an argument with staff and started to have chest pain.  Patient wanted to be evaluated and have lab work done.

## 2021-12-16 NOTE — ED Provider Notes (Signed)
°  Face-to-face evaluation   History: He presents for evaluation of chest pain which occurred for 30 minutes at a time and resolved.  She is unable to describe the pain, and is not aware of anything that tended to cause it.  She has never had this kind of pain before.  She denies shortness of breath, diaphoresis, nausea, vomiting, weakness or dizziness.  Her daughter is at the bedside and agrees with the presented history.  Physical exam: Elderly, alert and cooperative.  Lungs clear anteriorly.  Abdomen is soft and nontender.  Extremities, no edema.  Medical screening examination/treatment/procedure(s) were conducted as a shared visit with non-physician practitioner(s) and myself.  I personally evaluated the patient during the encounter    Daleen Bo, MD 12/17/21 1734

## 2021-12-16 NOTE — Discharge Instructions (Signed)
Lab work and imaging all reassuring, please continue all home medication as prescribed.  Follow-up your PCP for further evaluation.  Come back to the emergency department if you develop chest pain, shortness of breath, severe abdominal pain, uncontrolled nausea, vomiting, diarrhea.

## 2021-12-18 ENCOUNTER — Encounter: Payer: Self-pay | Admitting: Allergy & Immunology

## 2022-01-15 ENCOUNTER — Ambulatory Visit
Admission: EM | Admit: 2022-01-15 | Discharge: 2022-01-15 | Disposition: A | Discharge status: Home / Self Care | Payer: TRICARE For Life (TFL) | Attending: Urgent Care | Admitting: Urgent Care

## 2022-01-15 ENCOUNTER — Other Ambulatory Visit: Payer: Self-pay

## 2022-01-15 ENCOUNTER — Ambulatory Visit (INDEPENDENT_AMBULATORY_CARE_PROVIDER_SITE_OTHER): Payer: Medicare Other

## 2022-01-15 ENCOUNTER — Ambulatory Visit: Payer: Self-pay

## 2022-01-15 DIAGNOSIS — B349 Viral infection, unspecified: Secondary | ICD-10-CM | POA: Diagnosis not present

## 2022-01-15 DIAGNOSIS — R0982 Postnasal drip: Secondary | ICD-10-CM | POA: Diagnosis not present

## 2022-01-15 DIAGNOSIS — J4530 Mild persistent asthma, uncomplicated: Secondary | ICD-10-CM

## 2022-01-15 DIAGNOSIS — R059 Cough, unspecified: Secondary | ICD-10-CM | POA: Diagnosis not present

## 2022-01-15 DIAGNOSIS — R052 Subacute cough: Secondary | ICD-10-CM | POA: Diagnosis not present

## 2022-01-15 MED ORDER — BENZONATATE 100 MG PO CAPS: 100.0000 mg | ORAL_CAPSULE | Freq: Three times a day (TID) | ORAL | 0 refills | Status: DC | PRN

## 2022-01-15 MED ORDER — PREDNISONE 10 MG PO TABS: 30.0000 mg | ORAL_TABLET | Freq: Every day | ORAL | 0 refills | Status: DC

## 2022-01-15 MED ORDER — PROMETHAZINE-DM 6.25-15 MG/5ML PO SYRP: 5.0000 mL | ORAL_SOLUTION | Freq: Every evening | ORAL | 0 refills | Status: DC | PRN

## 2022-01-15 NOTE — ED Triage Notes (Signed)
Patient states that about a week ago she called PCP and got an antibiotic ( Azithromycin 250 ) for cold symptoms  Patient states she is having bad headache, throat hurting, throat swelling, fatigue and congestion  Patients daughter states she did a home Covid test and it was negative last week

## 2022-01-15 NOTE — ED Provider Notes (Signed)
[facility]   MRN: [id number] DOB: [birth year]  Subjective:   Christine Leblanc is a 88 y.o. female presenting for 1 week history of persistent coughing that is eliciting back pain.  She is also had throat congestion and mucus on her throat.  Has a history of dysphagia and makes it difficult for her to clear drainage on her throat.  No fevers, chest pain, shortness of breath or wheezing.  She does have a history of asthma and is supposed to be on albuterol as needed.  She did have a COVID test done and was negative.  She had called her PCP and they started her on azithromycin for cold symptoms without any testing or an office visit.  She has not gotten any better from this.  No current facility-administered medications for this encounter.  Current Outpatient Medications:    albuterol (VENTOLIN HFA) 108 (90 Base) MCG/ACT inhaler, Inhale 1-2 puffs into the lungs every 6 (six) hours as needed for wheezing or shortness of breath. Wheezing, Asthma Symptoms, Disp: 8 g, Rfl: 1   aspirin EC 81 MG tablet, Take 81 mg by mouth daily. Swallow whole., Disp: , Rfl:    CALCIUM-VITAMIN D PO, Take by mouth. Calcium 600 mg and Vitamin D 1000 unit. One tablet daily, Disp: , Rfl:    Carbinoxamine Maleate 4 MG TABS, Take 1 tablet (4 mg total) by mouth every 8 (eight) hours. (Patient taking differently: Take 1 tablet by mouth at bedtime.), Disp: 270 tablet, Rfl: 2   diazepam (VALIUM) 5 MG tablet, Take one tablet by mouth at bedtime as needed for sleep, Disp: 30 tablet, Rfl: 5   EPINEPHrine 0.3 mg/0.3 mL IJ SOAJ injection, Inject into the muscle as needed for anaphylaxis. (Patient not taking: Reported on 12/13/2021), Disp: , Rfl:    ferrous sulfate 325 (65 FE) MG tablet, Take 325 mg by mouth daily with breakfast., Disp: , Rfl:    fluticasone (FLONASE) 50 MCG/ACT nasal spray, Place 1-2 sprays into both nostrils daily as needed for allergies or rhinitis., Disp: , Rfl:    Glucosamine-Chondroit-Vit C-Mn  (GLUCOSAMINE 1500 COMPLEX PO), Take by mouth. Takes 1500mg  with Chonroitin 1200mg  once daily arthritis pain relief- 8 hr caplet once as needed., Disp: , Rfl:    ipratropium (ATROVENT) 0.06 % nasal spray, Place 2 sprays into both nostrils 3 (three) times daily., Disp: 45 mL, Rfl: 1   levothyroxine (SYNTHROID) 88 MCG tablet, TAKE 1 TABLET DAILY ON MONDAY THROUGH SATURDAY AND ONE-HALF (1/2) TABLET ON SUNDAY, Disp: 90 tablet, Rfl: 3   omeprazole (PRILOSEC OTC) 20 MG tablet, Take 1 tablet (20 mg total) by mouth daily before breakfast., Disp: 30 tablet, Rfl: 3   pravastatin (PRAVACHOL) 40 MG tablet, TAKE 2 TABLETS DAILY, Disp: 180 tablet, Rfl: 3   senna (SENOKOT) 8.6 MG tablet, Take 2 tablets by mouth at bedtime. , Disp: , Rfl:    TRELEGY ELLIPTA 100-62.5-25 MCG/ACT AEPB, Inhale 1 puff into the lungs daily., Disp: 84 each, Rfl: 1   vitamin B-12 (CYANOCOBALAMIN) 1000 MCG tablet, Take 1,000 mcg by mouth every morning., Disp: , Rfl:    vitamin C (ASCORBIC ACID) 500 MG tablet, Take 500 mg by mouth daily., Disp: , Rfl:    Allergies  Allergen Reactions   Celecoxib Shortness Of Breath   Dexilant [Dexlansoprazole] Anaphylaxis    abd pain   Famotidine     Makes her feel "very bad"    Past Medical History:  Diagnosis Date   Anemia  Asthma    Cancer ([place])    skin Ca- ? basal cell    Chronic back pain    Chronic neck pain    Degenerative joint disease    Left shoulder; cervical spine, knees & hands    Gastroesophageal reflux disease    Hiatal hernia; distal esophageal web requiring dilatation; gastric polyps; gastritis; refuses colonoscopy   GERD (gastroesophageal reflux disease)    History of stress test 1990's   stress test done under the care of Dr. Lattie Haw & Dr. Gwenlyn Found, now being followed by Dr. Harl Bowie- in [place] , recently seen & told to f/U in one yr.    Hyperlipidemia 04/27/2011   Hypertension    Hypothyroidism    Lymphocytic thyroiditis    Mild carotid artery disease ([place])     Multiple thyroid nodules 2010   Adenomatous; thyroidectomy in 2010   Orthostasis    Osteoarthritis of right knee 07/27/2014   Pneumonia    hosp. for pneumonia- long time ago    Primary localized osteoarthrosis of right shoulder 06/21/2015   Seizures ([place])    yes- as a child- & into adult years, states she took med. for them at one time, stopped at 32 yrs. of age    Syncope and collapse 2007   Possible CVA in 2012 with left lower extremity weakness; refused hospitalization; CT-Atrophy and chronic microvascular ischemic change.      Past Surgical History:  Procedure Laterality Date   ABDOMINAL HYSTERECTOMY     fibroids   CATARACT EXTRACTION     Bilateral; redo surgery on the right for incomplete primary procedure   COLONOSCOPY  Remote   ESOPHAGOGASTRODUODENOSCOPY  11/2009   Dr. Oneida Alar: probable distal web s/p dilation small hiatal hernia/gastric polyps/mild gastritis, appeared to have narrowing at the junction of D1 and D2 dilated up to 12 mm   ESOPHAGOGASTRODUODENOSCOPY N/A 03/21/2015   Surgeon: Danie Binder, MD; proximal esophageal web s/p dilation, multiple gastric polyps s/p multiple biopsies, mild erosive gastritis s/p biopsy.  Pathology with fundic gland polyps, chronic gastritis, negative for H. pylori.   EYE SURGERY     PROLAPSED UTERINE FIBROID LIGATION     outcomed with rectocele & cystocele   SAVORY DILATION N/A 03/21/2015   Procedure: SAVORY DILATION;  Surgeon: Danie Binder, MD;  Location: [facility];  Service: Endoscopy;  Laterality: N/A;   SHOULDER SURGERY     left   TOTAL KNEE ARTHROPLASTY Right 07/27/2014   Procedure: RIGHT TOTAL KNEE ARTHROPLASTY;  Surgeon: Johnny Bridge, MD;  Location: [facility];  Service: Orthopedics;  Laterality: Right;   TOTAL SHOULDER ARTHROPLASTY Right 06/21/2015   Procedure: RIGHT TOTAL SHOULDER ARTHROPLASTY;  Surgeon: Marchia Bond, MD;  Location: [facility];  Service: Orthopedics;  Laterality: Right;   TOTAL THYROIDECTOMY  2010    Family  History  Problem Relation Age of Onset   Heart disease Mother    Gallbladder disease Mother    Heart failure Mother    Diabetes Mother    Breast cancer Sister        10 years ago   Diabetes Sister    Heart attack Father    Diabetes Maternal Grandmother    Colon cancer Neg Hx    Stroke Neg Hx     Social History   Tobacco Use   Smoking status: Former    Packs/day: 0.80    Years: 20.00    Pack years: 16.00    Types: Cigarettes    Quit date: 12/31/1978  Years since quitting: 43.0   Smokeless tobacco: Never  Vaping Use   Vaping Use: Never used  Substance Use Topics   Alcohol use: Yes    Comment: rarely   Drug use: No    ROS   Objective:   Vitals: BP 122/71 (BP Location: Right Arm)    Pulse 91    Temp 98 F (36.7 C) (Oral)    Resp 16    SpO2 95%   Physical Exam Constitutional:      General: She is not in acute distress.    Appearance: Normal appearance. She is well-developed. She is not ill-appearing, toxic-appearing or diaphoretic.  HENT:     Head: Normocephalic and atraumatic.     Nose: Nose normal.     Mouth/Throat:     Mouth: Mucous membranes are moist.     Pharynx: No pharyngeal swelling, oropharyngeal exudate, posterior oropharyngeal erythema or uvula swelling.     Comments: Thick streaks of postnasal drainage overlying pharynx. Eyes:     Extraocular Movements: Extraocular movements intact.     Conjunctiva/sclera: Conjunctivae normal.  Cardiovascular:     Rate and Rhythm: Normal rate and regular rhythm.     Pulses: Normal pulses.     Heart sounds: Normal heart sounds. No murmur heard.   No friction rub. No gallop.  Pulmonary:     Effort: Pulmonary effort is normal. No respiratory distress.     Breath sounds: Normal breath sounds. No stridor. No wheezing, rhonchi or rales.  Skin:    General: Skin is warm and dry.     Findings: No rash.  Neurological:     Mental Status: She is alert and oriented to person, place, and time.  Psychiatric:        Mood  and Affect: Mood normal.        Behavior: Behavior normal.        Thought Content: Thought content normal.    DG Chest 2 View  Result Date: 01/15/2022 CLINICAL DATA:  Cough, congestion EXAM: CHEST - 2 VIEW COMPARISON:  12/16/2021 FINDINGS: The heart size and mediastinal contours are within normal limits. Both lungs are clear. Status post bilateral shoulder arthroplasty. IMPRESSION: No acute abnormality of the lungs. Electronically Signed   By: Delanna Ahmadi M.D.   On: 01/15/2022 13:19    Assessment and Plan :   PDMP not reviewed this encounter.  1. Subacute cough   2. Post-nasal drainage   3. Acute viral syndrome   4. Mild persistent asthma without complication    Recommended holding off on further antibiotic use.  In the context of her drainage and cough, asthma will be using an oral prednisone course.  Recommend supportive care otherwise.  Deferred COVID-19 testing as she had this done and was negative.  She also has a negative chest x-ray. Counseled patient on potential for adverse effects with medications prescribed/recommended today, ER and return-to-clinic precautions discussed, patient verbalized understanding.    Jaynee Eagles, PA-C 01/15/22 1351

## 2022-02-06 ENCOUNTER — Other Ambulatory Visit: Payer: Self-pay | Admitting: Gastroenterology

## 2022-02-06 DIAGNOSIS — K219 Gastro-esophageal reflux disease without esophagitis: Secondary | ICD-10-CM

## 2022-02-22 ENCOUNTER — Ambulatory Visit (INDEPENDENT_AMBULATORY_CARE_PROVIDER_SITE_OTHER): Payer: Medicare Other | Admitting: Endocrinology

## 2022-02-22 ENCOUNTER — Encounter: Payer: Self-pay | Admitting: Endocrinology

## 2022-02-22 ENCOUNTER — Encounter: Payer: Self-pay | Admitting: Internal Medicine

## 2022-02-22 ENCOUNTER — Other Ambulatory Visit: Payer: Self-pay

## 2022-02-22 VITALS — BP 120/68 | HR 88 | Ht 61.25 in | Wt 100.8 lb

## 2022-02-22 DIAGNOSIS — E890 Postprocedural hypothyroidism: Secondary | ICD-10-CM | POA: Diagnosis not present

## 2022-02-22 LAB — TSH: TSH: 1.26 u[IU]/mL (ref 0.35–5.50)

## 2022-02-22 LAB — T4, FREE: Free T4: 1.61 ng/dL — ABNORMAL HIGH (ref 0.60–1.60)

## 2022-02-22 NOTE — Progress Notes (Signed)
Christine Leblanc 88 y.o.    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  She has been followed every 6-12 months for her thyroid supplementation and monitoring of her levels However has not been seen in follow-up since 9/18  She previously had been on 100 mcg since 03/2014  This was subsequently reduced down to 75 g but now she is back up to 88 mcg  She was last seen in 10/2021  Even though her TSH was normal in August her TSH was unusually high at 8  She had not missed any doses or taken any interacting vitamins with her levothyroxine Also usually does not have any specific symptoms of hypothyroidism  She is still feeling tired and no change with increasing her levothyroxine May have lost weight recently from viral illness last month She is trying to get some protein supplements  She is still on generic levothyroxine from [facility] She is again very regular with the levothyroxine before eating in the morning  As before she takes Prilosec daily in the mornings also She takes her iron after lunch  Labs pending from today  Wt Readings from Last 3 Encounters:  02/22/22 100 lb 12.8 oz (45.7 kg)  12/16/21 106 lb (48.1 kg)  12/13/21 106 lb (48.1 kg)   Lab Results  Component Value Date   TSH 11.18 (H) 11/20/2021   TSH 1.913 08/10/2021   TSH 1.69 05/22/2021   FREET4 1.28 11/20/2021   FREET4 1.40 (H) 08/10/2021   FREET4 1.49 05/22/2021       Allergies as of 02/22/2022       Reactions   Celecoxib Shortness Of Breath   Dexilant [dexlansoprazole] Anaphylaxis   abd pain   Famotidine    Makes her feel "very bad"        Medication List        Accurate as of February 22, 2022  1:34 PM. If you have any questions, ask  your nurse or doctor.          albuterol 108 (90 Base) MCG/ACT inhaler Commonly known as: VENTOLIN HFA Inhale 1-2 puffs into the lungs every 6 (six) hours as needed for wheezing or shortness of breath. Wheezing, Asthma Symptoms   aspirin EC 81 MG tablet Take 81 mg by mouth daily. Swallow whole.   benzonatate 100 MG capsule Commonly known as: TESSALON Take 1-2 capsules (100-200 mg total) by mouth 3 (three) times daily as needed.   CALCIUM-VITAMIN D PO Take by mouth. Calcium 600 mg and Vitamin D 1000 unit. One tablet daily   Carbinoxamine Maleate 4 MG Tabs Take 1 tablet (4 mg total) by mouth every 8 (eight) hours. What changed: when to take this   diazepam 5 MG tablet Commonly known as: VALIUM Take one tablet by mouth at bedtime as needed for sleep   EPINEPHrine 0.3 mg/0.3 mL Soaj injection Commonly known as: EPI-PEN Inject into the muscle as needed for anaphylaxis.   ferrous sulfate 325 (65 FE) MG tablet Take 325 mg by mouth daily with breakfast.   fluticasone 50 MCG/ACT nasal spray Commonly known as: FLONASE Place 1-2 sprays into both nostrils daily as needed for allergies or rhinitis.   GLUCOSAMINE 1500 COMPLEX PO Take by mouth. Takes 1500mg  with Chonroitin  1200mg  once daily arthritis pain relief- 8 hr caplet once as needed.   ipratropium 0.06 % nasal spray Commonly known as: ATROVENT Place 2 sprays into both nostrils 3 (three) times daily.   levothyroxine 88 MCG tablet Commonly known as: SYNTHROID TAKE 1 TABLET DAILY ON MONDAY THROUGH SATURDAY AND ONE-HALF (1/2) TABLET ON SUNDAY   omeprazole 20 MG capsule Commonly known as: PRILOSEC TAKE 1 CAPSULE DAILY BEFORE BREAKFAST   pravastatin 40 MG tablet Commonly known as: PRAVACHOL TAKE 2 TABLETS DAILY   predniSONE 10 MG tablet Commonly known as: DELTASONE Take 3 tablets (30 mg total) by mouth daily with breakfast.   promethazine-dextromethorphan 6.25-15 MG/5ML syrup Commonly known as: PROMETHAZINE-DM Take 5  mLs by mouth at bedtime as needed for cough.   senna 8.6 MG tablet Commonly known as: SENOKOT Take 2 tablets by mouth at bedtime.   Trelegy Ellipta 100-62.5-25 MCG/ACT Aepb Generic drug: Fluticasone-Umeclidin-Vilant Inhale 1 puff into the lungs daily.   vitamin B-12 1000 MCG tablet Commonly known as: CYANOCOBALAMIN Take 1,000 mcg by mouth every morning.   vitamin C 500 MG tablet Commonly known as: ASCORBIC ACID Take 500 mg by mouth daily.        Allergies:  Allergies  Allergen Reactions   Celecoxib Shortness Of Breath   Dexilant [Dexlansoprazole] Anaphylaxis    abd pain   Famotidine     Makes her feel "very bad"    Past Medical History:  Diagnosis Date   Anemia    Asthma    Cancer ([place])    skin Ca- ? basal cell    Chronic back pain    Chronic neck pain    Degenerative joint disease    Left shoulder; cervical spine, knees & hands    Gastroesophageal reflux disease    Hiatal hernia; distal esophageal web requiring dilatation; gastric polyps; gastritis; refuses colonoscopy   GERD (gastroesophageal reflux disease)    History of stress test 1990's   stress test done under the care of Dr. Lattie Haw & Dr. Gwenlyn Found, now being followed by Dr. Harl Bowie- in [place] , recently seen & told to f/U in one yr.    Hyperlipidemia 04/27/2011   Hypertension    Hypothyroidism    Lymphocytic thyroiditis    Mild carotid artery disease ([place])    Multiple thyroid nodules 2010   Adenomatous; thyroidectomy in 2010   Orthostasis    Osteoarthritis of right knee 07/27/2014   Pneumonia    hosp. for pneumonia- long time ago    Primary localized osteoarthrosis of right shoulder 06/21/2015   Seizures ([place])    yes- as a child- & into adult years, states she took med. for them at one time, stopped at 32 yrs. of age    Syncope and collapse 2007   Possible CVA in 2012 with left lower extremity weakness; refused hospitalization; CT-Atrophy and chronic microvascular ischemic change.     Past  Surgical History:  Procedure Laterality Date   ABDOMINAL HYSTERECTOMY     fibroids   CATARACT EXTRACTION     Bilateral; redo surgery on the right for incomplete primary procedure   COLONOSCOPY  Remote   ESOPHAGOGASTRODUODENOSCOPY  11/2009   Dr. Oneida Alar: probable distal web s/p dilation small hiatal hernia/gastric polyps/mild gastritis, appeared to have narrowing at the junction of D1 and D2 dilated up to 12 mm   ESOPHAGOGASTRODUODENOSCOPY N/A 03/21/2015   Surgeon: Danie Binder, MD; proximal esophageal web s/p dilation, multiple gastric polyps s/p multiple biopsies, mild erosive gastritis s/p biopsy.  Pathology with fundic gland polyps, chronic gastritis, negative for H. pylori.   EYE SURGERY     PROLAPSED UTERINE FIBROID LIGATION     outcomed with rectocele & cystocele   SAVORY DILATION N/A 03/21/2015   Procedure: SAVORY DILATION;  Surgeon: Danie Binder, MD;  [facility];  Service: Endoscopy;  Laterality: N/A;   SHOULDER SURGERY     left   TOTAL KNEE ARTHROPLASTY Right 07/27/2014   Procedure: RIGHT TOTAL KNEE ARTHROPLASTY;  Surgeon: Johnny Bridge, MD;  Location: [place];  Service: Orthopedics;  Laterality: Right;   TOTAL SHOULDER ARTHROPLASTY Right 06/21/2015   Procedure: RIGHT TOTAL SHOULDER ARTHROPLASTY;  Surgeon: Marchia Bond, MD;  Location: [place];  Service: Orthopedics;  Laterality: Right;   TOTAL THYROIDECTOMY  2010    Family History  Problem Relation Age of Onset   Heart disease Mother    Gallbladder disease Mother    Heart failure Mother    Diabetes Mother    Breast cancer Sister        46 years ago   Diabetes Sister    Heart attack Father    Diabetes Maternal Grandmother    Colon cancer Neg Hx    Stroke Neg Hx     Social History:  reports that she quit smoking about 43 years ago. Her smoking use included cigarettes. She has a 16.00 pack-year smoking history. She has never used smokeless tobacco. She reports current alcohol use. She reports that she does  not use drugs.  REVIEW Of SYSTEMS:  ROS  No recent change in appetite or intake Weight History:  Wt Readings from Last 3 Encounters:  02/22/22 100 lb 12.8 oz (45.7 kg)  12/16/21 106 lb (48.1 kg)  12/13/21 106 lb (48.1 kg)    History of hypercholesterolemia followed by PCP, treated with pravastatin 40 mg   She has chronic difficulty swallowing and is on Prilosec for reflux followed by GI      Examination:   BP 120/68    Pulse 88    Ht 5' 1.25" (1.556 m)    Wt 100 lb 12.8 oz (45.7 kg)    SpO2 98%    BMI 18.89 kg/m   No ankle edema skin      Assessments   Hypothyroidism, postsurgical and long-standing  She has been taking 88 micrograms levothyroxine since November  Her weight fluctuates based on intercurrent medical problems and illnesses Difficult to assess her symptoms since she had nonspecific fatigue which did not get better with increasing her dose on the last visit Labs pending  She takes her levothyroxine consistently as directed before breakfast We will let her know what her labs are and if normal she can come back in 6 months   There are no Patient Instructions on file for this visit.   Elayne Snare 02/22/2022, 1:34 PM     Addendum: TSH normal, she will stay on 88 mcg

## 2022-03-29 ENCOUNTER — Ambulatory Visit: Payer: Medicare Other | Admitting: Internal Medicine

## 2022-04-26 ENCOUNTER — Other Ambulatory Visit: Payer: Self-pay

## 2022-04-26 ENCOUNTER — Ambulatory Visit (INDEPENDENT_AMBULATORY_CARE_PROVIDER_SITE_OTHER): Payer: Medicare Other | Admitting: Internal Medicine

## 2022-04-26 VITALS — BP 120/60 | HR 91 | Temp 97.1°F | Ht 62.0 in | Wt 102.4 lb

## 2022-04-26 DIAGNOSIS — R634 Abnormal weight loss: Secondary | ICD-10-CM

## 2022-04-26 DIAGNOSIS — R1310 Dysphagia, unspecified: Secondary | ICD-10-CM | POA: Diagnosis not present

## 2022-04-26 DIAGNOSIS — K219 Gastro-esophageal reflux disease without esophagitis: Secondary | ICD-10-CM

## 2022-04-26 NOTE — Progress Notes (Signed)
? ? ?Referring Provider: No ref. provider found ?Primary Care Physician:  Pcp, No ?Primary GI:  Dr. Abbey Chatters ? ?Chief Complaint  ?Patient presents with  ? Follow-up  ? ? ?HPI:   ?Christine Leblanc is a 88 y.o. female who presents to the clinic today for follow-up visit.  She has a history of chronic GERD which is well controlled on omeprazole.  She also has dysphagia which has previously been diagnosed as idiopathic.  She states this is a chronic issue for her.  She cuts all her food up into small bites and eats very slowly.  She states it takes a long time for her to consume an entire meal.   ? ?Continues to lose weight. Down to 102 lbs today. States she got sick with COVID in January and has had difficult time putting weight back on.  Does note getting under 100 pounds at 1 point so she is heading back in the right direction.  Drinking at least 1 protein shake daily, sometimes 2. ? ?Bowels are moving well with senna daily. No brbpr. Occasional dark stools on iron. ? ?Past Medical History:  ?Diagnosis Date  ? Anemia   ? Asthma   ? Cancer [facility])   ? skin Ca- ? basal cell   ? Chronic back pain   ? Chronic neck pain   ? Degenerative joint disease   ? Left shoulder; cervical spine, knees & hands   ? Gastroesophageal reflux disease   ? Hiatal hernia; distal esophageal web requiring dilatation; gastric polyps; gastritis; refuses colonoscopy  ? GERD (gastroesophageal reflux disease)   ? History of stress test 1990's  ? stress test done under the care of Dr. Lattie Haw & Dr. Gwenlyn Found, now being followed by Dr. Harl Bowie- in [place] , recently seen & told to f/U in one yr.   ? Hyperlipidemia 04/27/2011  ? Hypertension   ? Hypothyroidism   ? Lymphocytic thyroiditis   ? Mild carotid artery disease ([place])   ? Multiple thyroid nodules 2010  ? Adenomatous; thyroidectomy in 2010  ? Orthostasis   ? Osteoarthritis of right knee 07/27/2014  ? Pneumonia   ? hosp. for pneumonia- long time ago   ? Primary localized osteoarthrosis of right shoulder  06/21/2015  ? Seizures ([place])   ? yes- as a child- & into adult years, states she took med. for them at one time, stopped at 32 yrs. of age   ? Syncope and collapse 2007  ? Possible CVA in 2012 with left lower extremity weakness; refused hospitalization; CT-Atrophy and chronic microvascular ischemic change.   ? ? ?Past Surgical History:  ?Procedure Laterality Date  ? ABDOMINAL HYSTERECTOMY    ? fibroids  ? CATARACT EXTRACTION    ? Bilateral; redo surgery on the right for incomplete primary procedure  ? COLONOSCOPY  Remote  ? ESOPHAGOGASTRODUODENOSCOPY  11/2009  ? Dr. Oneida Alar: probable distal web s/p dilation small hiatal hernia/gastric polyps/mild gastritis, appeared to have narrowing at the junction of D1 and D2 dilated up to 12 mm  ? ESOPHAGOGASTRODUODENOSCOPY N/A 03/21/2015  ? Surgeon: Danie Binder, MD; proximal esophageal web s/p dilation, multiple gastric polyps s/p multiple biopsies, mild erosive gastritis s/p biopsy.  Pathology with fundic gland polyps, chronic gastritis, negative for H. pylori.  ? EYE SURGERY    ? PROLAPSED UTERINE FIBROID LIGATION    ? outcomed with rectocele & cystocele  ? SAVORY DILATION N/A 03/21/2015  ? Procedure: SAVORY DILATION;  Surgeon: Danie Binder, MD;  [facility];  Service: Endoscopy;  Laterality: N/A;  ? SHOULDER SURGERY    ? left  ? TOTAL KNEE ARTHROPLASTY Right 07/27/2014  ? Procedure: RIGHT TOTAL KNEE ARTHROPLASTY;  Surgeon: Johnny Bridge, MD;  Location: [place];  Service: Orthopedics;  Laterality: Right;  ? TOTAL SHOULDER ARTHROPLASTY Right 06/21/2015  ? Procedure: RIGHT TOTAL SHOULDER ARTHROPLASTY;  Surgeon: Marchia Bond, MD;  Location: [place];  Service: Orthopedics;  Laterality: Right;  ? TOTAL THYROIDECTOMY  2010  ? ? ?Current Outpatient Medications  ?Medication Sig Dispense Refill  ? albuterol (VENTOLIN HFA) 108 (90 Base) MCG/ACT inhaler Inhale 1-2 puffs into the lungs every 6 (six) hours as needed for wheezing or shortness of breath. Wheezing, Asthma  Symptoms 8 g 1  ? aspirin EC 81 MG tablet Take 81 mg by mouth daily. Swallow whole.    ? CALCIUM-VITAMIN D PO Take by mouth. Calcium 600 mg and Vitamin D 1000 unit. One tablet daily    ? ferrous sulfate 325 (65 FE) MG tablet Take 325 mg by mouth daily with breakfast.    ? Glucosamine-Chondroit-Vit C-Mn (GLUCOSAMINE 1500 COMPLEX PO) Take by mouth. Takes '1500mg'$  with Chonroitin '1200mg'$  once daily arthritis pain relief- 8 hr caplet once as needed.    ? ipratropium (ATROVENT) 0.06 % nasal spray Place 2 sprays into both nostrils 3 (three) times daily. 45 mL 1  ? levothyroxine (SYNTHROID) 88 MCG tablet TAKE 1 TABLET DAILY ON MONDAY THROUGH SATURDAY AND ONE-HALF (1/2) TABLET ON 'SUNDAY 90 tablet 3  ? omeprazole (PRILOSEC) 20 MG capsule TAKE 1 CAPSULE DAILY BEFORE BREAKFAST 30 capsule 11  ? pravastatin (PRAVACHOL) 40 MG tablet TAKE 2 TABLETS DAILY 180 tablet 3  ? senna (SENOKOT) 8.6 MG tablet Take 2 tablets by mouth at bedtime.     ? TRELEGY ELLIPTA 100-62.5-25 MCG/ACT AEPB Inhale 1 puff into the lungs daily. 84 each 1  ? vitamin B-12 (CYANOCOBALAMIN) 1000 MCG tablet Take 1,000 mcg by mouth every morning.    ? vitamin C (ASCORBIC ACID) 500 MG tablet Take 500 mg by mouth daily.    ? benzonatate (TESSALON) 100 MG capsule Take 1-2 capsules (100-200 mg total) by mouth 3 (three) times daily as needed. (Patient not taking: Reported on 04/26/2022) 60 capsule 0  ? Carbinoxamine Maleate 4 MG TABS Take 1 tablet (4 mg total) by mouth every 8 (eight) hours. (Patient taking differently: Take 1 tablet by mouth at bedtime.) 270 tablet 2  ? EPINEPHrine 0.3 mg/0.3 mL IJ SOAJ injection Inject into the muscle as needed for anaphylaxis. (Patient not taking: Reported on 12/13/2021)    ? fluticasone (FLONASE) 50 MCG/ACT nasal spray Place 1-2 sprays into both nostrils daily as needed for allergies or rhinitis. (Patient not taking: Reported on 04/26/2022)    ? promethazine-dextromethorphan (PROMETHAZINE-DM) 6.25-15 MG/5ML syrup Take 5 mLs by mouth at  bedtime as needed for cough. (Patient not taking: Reported on 02/22/2022) 100 mL 0  ? ?No current facility-administered medications for this visit.  ? ? ?Allergies as of 04/26/2022 - Review Complete 04/26/2022  ?Allergen Reaction Noted  ? Celecoxib Shortness Of Breath   ? Dexilant [dexlansoprazole] Anaphylaxis 05/04/2016  ? Famotidine  11/04/2018  ? ? ?Family History  ?Problem Relation Age of Onset  ? Heart disease Mother   ? Gallbladder disease Mother   ? Heart failure Mother   ? Diabetes Mother   ? Breast cancer Sister   ?     30'$  years ago  ? Diabetes Sister   ? Heart attack  Father   ? Diabetes Maternal Grandmother   ? Colon cancer Neg Hx   ? Stroke Neg Hx   ? ? ?Social History  ? ?Socioeconomic History  ? Marital status: Widowed  ?  Spouse name: Not on file  ? Number of children: 4  ? Years of education: 43  ? Highest education level: Not on file  ?Occupational History  ? Occupation: Artist  ?  Comment: does not yield regular income  ? 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 Smoking status: Former  ?  Packs/day: 0.80  ?  Years: 20.00  ?  Pack years: 16.00  ?  Types: Cigarettes  ?  Quit date: 12/31/1978  ?  Years since quitting: 43.3  ? Smokeless tobacco: Never  ?Vaping Use  ? Vaping Use: Never used  ?Substance and Sexual Activity  ? Alcohol use: Yes  ?  Comment: rarely  ? Drug use: No  ? Sexual activity: Never  ?  Birth control/protection: Surgical  ?  Comment: widowed since 2010  ?Other Topics Concern  ? Not on file  ?Social History Narrative  ? ** Merged History Encounter **  ?    ? Lives at home. Her daughter lives with her.  ?Caffeine use: 1 cup coffee per day ?  ? ?Social Determinants of Health  ? ?Financial Resource Strain: Not on file  ?Food Insecurity: Not on file  ?Transportation Needs: Not on file  ?Physical Activity: Not on file  ?Stress: Not on file  ?Social Connections: Not on file  ? ? ?Subjective: ?Review of Systems  ?Constitutional:  Negative for chills and fever.  ?HENT:  Negative  for congestion and hearing loss.   ?Eyes:  Negative for blurred vision and double vision.  ?Respiratory:  Negative for cough and shortness of breath.   ?Cardiovascular:  Negative for chest pain and palpitatio

## 2022-04-26 NOTE — Patient Instructions (Signed)
You can try to take your omeprazole on an as-needed basis and see how you do. ? ?Continue to cut your food into very small bites given your difficulty swallowing. ? ?Recommend high-fat high-protein foods.  I will refer you to a dietitian to discuss further. ? ?Continue on fiber and senna for your bowels. ? ?Follow-up in 3 to 4 months or sooner if needed. ? ?It was very nice seeing you again today. ? ?Dr. Abbey Chatters ? ?At [facility] we value your feedback. You may receive a survey about your visit today. Please share your experience as we strive to create trusting relationships with our patients to provide genuine, compassionate, quality care. ? ?We appreciate your understanding and patience as we review any laboratory studies, imaging, and other diagnostic tests that are ordered as we care for you. Our office policy is 5 business days for review of these results, and any emergent or urgent results are addressed in a timely manner for your best interest. If you do not hear from our office in 1 week, please contact us.  ? ?We also encourage the use of MyChart, which contains your medical information for your review as well. If you are not enrolled in this feature, an access code is on this after visit summary for your convenience. Thank you for allowing [place] to be involved in your care. ? ?It was great to see you today!  I hope you have a great rest of your Spring! ? ? ? ?Christine Leblanc. Abbey Chatters, D.O. ?Gastroenterology and Hepatology ?[facility] ? ?

## 2022-05-08 NOTE — Progress Notes (Deleted)
Cardiology Office Note    Date:  05/08/2022   ID:  Christine Leblanc, DOB 12-03-1929, MRN [id number]  PCP:  Merryl Hacker, No  Cardiologist: Carlyle Dolly, MD    No chief complaint on file.   History of Present Illness:    Christine Leblanc is a 88 y.o. female with past medical history of HTN, HLD, Hypothyroidism, palpitations (PAC's and PVC's by prior monitor), Stage III CKD, GERD and hiatal hernia who presents to the office today for overdue follow-up.   She was last examined by myself in 06/2020 and had recently been evaluated for episodes of near syncope with an echocardiogram and event monitor recommended for additional evaluation. Her echo showed a preserved EF with only trivial MR and her monitor showed predominately NSR with no significant arrhythmias. She felt her prior episodes ere possibly secondary to dehydration and she had increased her fluid intake. She was informed to follow-up in 1 year but has not been evaluated by Cardiology since.     Past Medical History:  Diagnosis Date   Anemia    Asthma    Cancer ([place])    skin Ca- ? basal cell    Chronic back pain    Chronic neck pain    Degenerative joint disease    Left shoulder; cervical spine, knees & hands    Gastroesophageal reflux disease    Hiatal hernia; distal esophageal web requiring dilatation; gastric polyps; gastritis; refuses colonoscopy   GERD (gastroesophageal reflux disease)    History of stress test 1990's   stress test done under the care of Dr. Lattie Haw & Dr. Gwenlyn Found, now being followed by Dr. Harl Bowie- in [place] , recently seen & told to f/U in one yr.    Hyperlipidemia 04/27/2011   Hypertension    Hypothyroidism    Lymphocytic thyroiditis    Mild carotid artery disease ([place])    Multiple thyroid nodules 2010   Adenomatous; thyroidectomy in 2010   Orthostasis    Osteoarthritis of right knee 07/27/2014   Pneumonia    hosp. for pneumonia- long time ago    Primary localized osteoarthrosis of right shoulder  06/21/2015   Seizures ([place])    yes- as a child- & into adult years, states she took med. for them at one time, stopped at 32 yrs. of age    Syncope and collapse 2007   Possible CVA in 2012 with left lower extremity weakness; refused hospitalization; CT-Atrophy and chronic microvascular ischemic change.     Past Surgical History:  Procedure Laterality Date   ABDOMINAL HYSTERECTOMY     fibroids   CATARACT EXTRACTION     Bilateral; redo surgery on the right for incomplete primary procedure   COLONOSCOPY  Remote   ESOPHAGOGASTRODUODENOSCOPY  11/2009   Dr. Oneida Alar: probable distal web s/p dilation small hiatal hernia/gastric polyps/mild gastritis, appeared to have narrowing at the junction of D1 and D2 dilated up to 12 mm   ESOPHAGOGASTRODUODENOSCOPY N/A 03/21/2015   Surgeon: Danie Binder, MD; proximal esophageal web s/p dilation, multiple gastric polyps s/p multiple biopsies, mild erosive gastritis s/p biopsy.  Pathology with fundic gland polyps, chronic gastritis, negative for H. pylori.   EYE SURGERY     PROLAPSED UTERINE FIBROID LIGATION     outcomed with rectocele & cystocele   SAVORY DILATION N/A 03/21/2015   Procedure: SAVORY DILATION;  Surgeon: Danie Binder, MD;  [facility];  Service: Endoscopy;  Laterality: N/A;   SHOULDER SURGERY     left  TOTAL KNEE ARTHROPLASTY Right 07/27/2014   Procedure: RIGHT TOTAL KNEE ARTHROPLASTY;  Surgeon: Johnny Bridge, MD;  Location: [facility];  Service: Orthopedics;  Laterality: Right;   TOTAL SHOULDER ARTHROPLASTY Right 06/21/2015   Procedure: RIGHT TOTAL SHOULDER ARTHROPLASTY;  Surgeon: Marchia Bond, MD;  Location: [facility];  Service: Orthopedics;  Laterality: Right;   TOTAL THYROIDECTOMY  2010    Current Medications: Outpatient Medications Prior to Visit  Medication Sig Dispense Refill   albuterol (VENTOLIN HFA) 108 (90 Base) MCG/ACT inhaler Inhale 1-2 puffs into the lungs every 6 (six) hours as needed for wheezing or shortness of  breath. Wheezing, Asthma Symptoms 8 g 1   aspirin EC 81 MG tablet Take 81 mg by mouth daily. Swallow whole.     benzonatate (TESSALON) 100 MG capsule Take 1-2 capsules (100-200 mg total) by mouth 3 (three) times daily as needed. (Patient not taking: Reported on 04/26/2022) 60 capsule 0   CALCIUM-VITAMIN D PO Take by mouth. Calcium 600 mg and Vitamin D 1000 unit. One tablet daily     Carbinoxamine Maleate 4 MG TABS Take 1 tablet (4 mg total) by mouth every 8 (eight) hours. (Patient taking differently: Take 1 tablet by mouth at bedtime.) 270 tablet 2   EPINEPHrine 0.3 mg/0.3 mL IJ SOAJ injection Inject into the muscle as needed for anaphylaxis. (Patient not taking: Reported on 12/13/2021)     ferrous sulfate 325 (65 FE) MG tablet Take 325 mg by mouth daily with breakfast.     fluticasone (FLONASE) 50 MCG/ACT nasal spray Place 1-2 sprays into both nostrils daily as needed for allergies or rhinitis. (Patient not taking: Reported on 04/26/2022)     Glucosamine-Chondroit-Vit C-Mn (GLUCOSAMINE 1500 COMPLEX PO) Take by mouth. Takes '1500mg'$  with Chonroitin '1200mg'$  once daily arthritis pain relief- 8 hr caplet once as needed.     ipratropium (ATROVENT) 0.06 % nasal spray Place 2 sprays into both nostrils 3 (three) times daily. 45 mL 1   levothyroxine (SYNTHROID) 88 MCG tablet TAKE 1 TABLET DAILY ON MONDAY THROUGH SATURDAY AND ONE-HALF (1/2) TABLET ON SUNDAY 90 tablet 3   omeprazole (PRILOSEC) 20 MG capsule TAKE 1 CAPSULE DAILY BEFORE BREAKFAST 30 capsule 11   pravastatin (PRAVACHOL) 40 MG tablet TAKE 2 TABLETS DAILY 180 tablet 3   promethazine-dextromethorphan (PROMETHAZINE-DM) 6.25-15 MG/5ML syrup Take 5 mLs by mouth at bedtime as needed for cough. (Patient not taking: Reported on 02/22/2022) 100 mL 0   senna (SENOKOT) 8.6 MG tablet Take 2 tablets by mouth at bedtime.      TRELEGY ELLIPTA 100-62.5-25 MCG/ACT AEPB Inhale 1 puff into the lungs daily. 84 each 1   vitamin B-12 (CYANOCOBALAMIN) 1000 MCG tablet Take  1,000 mcg by mouth every morning.     vitamin C (ASCORBIC ACID) 500 MG tablet Take 500 mg by mouth daily.     No facility-administered medications prior to visit.     Allergies:   Celecoxib, Dexilant [dexlansoprazole], and Famotidine   Social History   Socioeconomic History   Marital status: Widowed    Spouse name: Not on file   Number of children: 4   Years of education: 21   Highest education level: Not on file  Occupational History   Occupation: Artist    Comment: does not yield regular income   Occupation: Retired    Comment: Marine scientist  Tobacco Use   Smoking status: Former    Packs/day: 0.80    Years: 20.00    Pack years: 16.00    Types:  Cigarettes    Quit date: 12/31/1978    Years since quitting: 43.3   Smokeless tobacco: Never  Vaping Use   Vaping Use: Never used  Substance and Sexual Activity   Alcohol use: Yes    Comment: rarely   Drug use: No   Sexual activity: Never    Birth control/protection: Surgical    Comment: widowed since 2010  Other Topics Concern   Not on file  Social History Narrative   ** Merged History Encounter **       Lives at home. Her daughter lives with her.  Caffeine use: 1 cup coffee per day    Social Determinants of Health   Financial Resource Strain: Not on file  Food Insecurity: Not on file  Transportation Needs: Not on file  Physical Activity: Not on file  Stress: Not on file  Social Connections: Not on file     Family History:  The patient's ***family history includes Breast cancer in her sister; Diabetes in her maternal grandmother, mother, and sister; Gallbladder disease in her mother; Heart attack in her father; Heart disease in her mother; Heart failure in her mother.   Review of Systems:    Please see the history of present illness.     All other systems reviewed and are otherwise negative except as noted above.   Physical Exam:    VS:  There were no vitals taken for this visit.   General: Well developed, well  nourished,female appearing in no acute distress. Head: Normocephalic, atraumatic. Neck: No carotid bruits. JVD not elevated.  Lungs: Respirations regular and unlabored, without wheezes or rales.  Heart: ***Regular rate and rhythm. No S3 or S4.  No murmur, no rubs, or gallops appreciated. Abdomen: Appears non-distended. No obvious abdominal masses. Msk:  Strength and tone appear normal for age. No obvious joint deformities or effusions. Extremities: No clubbing or cyanosis. No edema.  Distal pedal pulses are 2+ bilaterally. Neuro: Alert and oriented X 3. Moves all extremities spontaneously. No focal deficits noted. Psych:  Responds to questions appropriately with a normal affect. Skin: No rashes or lesions noted  Wt Readings from Last 3 Encounters:  04/26/22 102 lb 6.4 oz (46.4 kg)  02/22/22 100 lb 12.8 oz (45.7 kg)  12/16/21 106 lb (48.1 kg)        Studies/Labs Reviewed:   EKG:  EKG is*** ordered today.  The ekg ordered today demonstrates ***  Recent Labs: 12/16/2021: BUN 28; Creatinine, Ser 1.11; Hemoglobin 10.1; Platelets 177; Potassium 3.9; Sodium 139 02/22/2022: TSH 1.26   Lipid Panel    Component Value Date/Time   CHOL 150 12/11/2019 0000   TRIG 66 12/11/2019 0000   HDL 60 12/11/2019 0000   CHOLHDL 2.5 09/20/2016 0951   VLDL 10 09/20/2016 0951   LDLCALC 77 12/11/2019 0000    Additional studies/ records that were reviewed today include:   Echocardiogram: 05/2020 IMPRESSIONS     1. Left ventricular ejection fraction, by estimation, is 70 to 75%. The  left ventricle has hyperdynamic function. The left ventricle has no  regional wall motion abnormalities. There is mild left ventricular  hypertrophy. Left ventricular diastolic  parameters are indeterminate.   2. Right ventricular systolic function is normal. The right ventricular  size is normal. There is normal pulmonary artery systolic pressure. The  estimated right ventricular systolic pressure is 35.3 mmHg.    3. The mitral valve is grossly normal. Trivial mitral valve  regurgitation.   4. The aortic valve is tricuspid. Aortic  valve regurgitation is not  visualized. Mild aortic valve sclerosis is present, with no evidence of  aortic valve stenosis.   5. The inferior vena cava is normal in size with greater than 50%  respiratory variability, suggesting right atrial pressure of 3 mmHg.   Event Monitor: 05/2020 30 day event monitor Min HR 61, Avg HR 84, Max HR 129 Reported symptoms correlated with normal sinus rhythm No significant arrhythmias  Assessment:    No diagnosis found.   Plan:   In order of problems listed above:  ***    Shared Decision Making/Informed Consent:   {Are you ordering a CV Procedure (e.g. stress test, cath, DCCV, TEE, etc)?   Press F2        :[id number]}    Medication Adjustments/Labs and Tests Ordered: Current medicines are reviewed at length with the patient today.  Concerns regarding medicines are outlined above.  Medication changes, Labs and Tests ordered today are listed in the Patient Instructions below. There are no Patient Instructions on file for this visit.   Signed, Erma Heritage, PA-C  05/08/2022 3:28 PM    [facility] [address] [place], [place] [address] Phone: [phone] Fax: [phone]

## 2022-05-09 ENCOUNTER — Ambulatory Visit: Payer: Medicare Other | Admitting: Student

## 2022-05-16 DIAGNOSIS — R0982 Postnasal drip: Secondary | ICD-10-CM | POA: Diagnosis not present

## 2022-05-16 DIAGNOSIS — R1310 Dysphagia, unspecified: Secondary | ICD-10-CM | POA: Diagnosis not present

## 2022-05-16 DIAGNOSIS — J4530 Mild persistent asthma, uncomplicated: Secondary | ICD-10-CM | POA: Diagnosis not present

## 2022-05-16 DIAGNOSIS — J310 Chronic rhinitis: Secondary | ICD-10-CM | POA: Diagnosis not present

## 2022-05-16 DIAGNOSIS — Z87891 Personal history of nicotine dependence: Secondary | ICD-10-CM | POA: Diagnosis not present

## 2022-05-21 ENCOUNTER — Ambulatory Visit: Payer: Medicare Other | Admitting: Endocrinology

## 2022-05-24 ENCOUNTER — Ambulatory Visit: Payer: Medicare Other | Admitting: Internal Medicine

## 2022-06-13 ENCOUNTER — Encounter: Payer: Self-pay | Admitting: Internal Medicine

## 2022-06-20 ENCOUNTER — Other Ambulatory Visit: Payer: Self-pay

## 2022-06-20 ENCOUNTER — Ambulatory Visit (INDEPENDENT_AMBULATORY_CARE_PROVIDER_SITE_OTHER): Payer: Medicare Other | Admitting: Allergy & Immunology

## 2022-06-20 ENCOUNTER — Encounter: Payer: Self-pay | Admitting: Allergy & Immunology

## 2022-06-20 VITALS — BP 136/80 | HR 96 | Temp 98.3°F | Resp 16 | Wt 98.4 lb

## 2022-06-20 DIAGNOSIS — J310 Chronic rhinitis: Secondary | ICD-10-CM

## 2022-06-20 DIAGNOSIS — K219 Gastro-esophageal reflux disease without esophagitis: Secondary | ICD-10-CM

## 2022-06-20 DIAGNOSIS — J4540 Moderate persistent asthma, uncomplicated: Secondary | ICD-10-CM | POA: Diagnosis not present

## 2022-06-20 MED ORDER — LEVOCETIRIZINE DIHYDROCHLORIDE 5 MG PO TABS: 5.0000 mg | ORAL_TABLET | Freq: Every evening | ORAL | 0 refills | Status: DC

## 2022-06-20 NOTE — Progress Notes (Signed)
FOLLOW UP  Date of Service/Encounter:  06/20/22   Assessment:   Chronic rhinitis - s/p 30+ years allergen immunotherapy    Moderate persistent asthma - with marked improvement with bronchodilator challenge  GERD - on omeprazole (followed by Dr. Hurshel Keys)  Weight loss - currently being managed by GI  Hypothyroidism secondary to surgical thyroid removal  MGUS - originally diagnosed in 2018 (last seen by Oncology in December 2020  Plan/Recommendations:    1. Chronic rhinitis - with negative testing to the entire panel - Per your request, we are going to stop the carbinoxamine for ONE MONTH. - Start Xyzal (levocetirizine) one tablet once a day.  - Continue with your nose spray.   2. Mild persistent asthma, uncomplicated  - Lung testing not done today.  - We are not going to make any changes at this time.  - Daily controller medication(s): NOTHING - Prior to physical activity: albuterol 2 puffs 10-15 minutes before physical activity. - Rescue medications: albuterol 4 puffs every 4-6 hours as needed - Asthma control goals:  * Full participation in all desired activities (may need albuterol before activity) * Albuterol use two time or less a week on average (not counting use with activity) * Cough interfering with sleep two time or less a month * Oral steroids no more than once a year * No hospitalizations  3. Return to clinic in 6 months or earlier if needed.     Subjective:   Christine Leblanc is a 88 y.o. female presenting today for follow up of  Chief Complaint  Patient presents with   Other    Reflux - 06/19/22 had a lot of mucus and severe coughing. Has not been feeling good made an appointment with pulmonary for August. No issues with her O2 levels and has been told her chest sounds good    Allergic Rhinitis     Runny nose and chills     Christine Leblanc has a history of the following: Patient Active Problem List   Diagnosis Date Noted   Loss of weight  10/09/2021   Pharyngeal dysphagia 10/09/2021   Mild carotid artery disease ([place]) 05/19/2020   Near syncope 12/26/2018   History of TIA (transient ischemic attack) 12/25/2018   Neck pain    Chest pain 09/30/2018   Generalized weakness 09/30/2018   Hypokalemia    MGUS (monoclonal gammopathy of unknown significance) 10/21/2017   Primary localized osteoarthrosis of right shoulder 06/21/2015   S/P shoulder replacement 06/21/2015   Dysphagia, idiopathic 11/29/2014   Osteoarthritis of right knee 07/27/2014   Postsurgical hypothyroidism 04/22/2014   Hypothyroidism 10/12/2013   Syncope and collapse    Anemia, normocytic normochromic 04/26/2012   Gastroesophageal reflux disease    Essential hypertension 12/07/2011   Leg pain, bilateral 04/27/2011   Dyslipidemia 04/27/2011   Other dysphagia 06/07/2010    History obtained from: chart review and patient.  Christine Leblanc is a 88 y.o. female presenting for a follow up visit.  She was last seen in December 2022.  At that time, we continue with carbinoxamine 4 mg every 8 hours.  We continued Flonase as well as ipratropium.  For asthma, lung testing was in the 41% range but improved to the 70% range.  We gave her samples of Trelegy to use 1 puff once daily.  Since last visit, she has not done well. She reports that she was told that she could not have anything for the reflux.   Asthma/Respiratory Symptom History: She reports  the Trelegy "is not for me". She reports that she just "cannot take it" and it will "kill  me". She did open one box to look at the side effects. She tells me that it made her breathing worse than it was previously. She has not been having issues with her breathing since getting the Trelegy to use. It is unclear whether she has ever had the inhaler at all. She does not really elaborate on what the inhaler did to her. But she was not interested in continuing with it at all.   Allergic Rhinitis Symptom History: She remains on the same nose  spray. This was helping a lot.  She reports that the carbinoxamine causes her to sleep a lot at night.   GERD Symptom History: She is on omeprazole '20mg'$  once daily. Her stomach had not been bothering her. She was told that she can take it PRN but she feels that she needs it "all of the time".   Otherwise, there have been no changes to her past medical history, surgical history, family history, or social history.    Review of Systems  Constitutional: Negative.  Negative for chills, fever, malaise/fatigue and weight loss.  HENT:  Positive for congestion. Negative for ear discharge and ear pain.        Positive for mucous production.   Eyes:  Negative for pain, discharge and redness.  Respiratory:  Negative for cough, sputum production, shortness of breath and wheezing.   Cardiovascular: Negative.  Negative for chest pain and palpitations.  Gastrointestinal:  Negative for abdominal pain, constipation, diarrhea, heartburn, nausea and vomiting.  Skin: Negative.  Negative for itching and rash.  Neurological:  Negative for dizziness and headaches.  Endo/Heme/Allergies:  Negative for environmental allergies. Does not bruise/bleed easily.       Objective:   Blood pressure 136/80, pulse 96, temperature 98.3 F (36.8 C), resp. rate 16, weight 98 lb 6.4 oz (44.6 kg), SpO2 96 %. Body mass index is 18 kg/m.    Physical Exam Vitals reviewed.  Constitutional:      Appearance: She is well-developed and underweight.     Comments: Talkative. History is all over the place.   HENT:     Head: Normocephalic and atraumatic.     Right Ear: Tympanic membrane, ear canal and external ear normal. No drainage, swelling or tenderness. Tympanic membrane is not injected, scarred, erythematous, retracted or bulging.     Left Ear: Tympanic membrane, ear canal and external ear normal. No drainage, swelling or tenderness. Tympanic membrane is not injected, scarred, erythematous, retracted or bulging.     Nose:  No nasal deformity, septal deviation, mucosal edema or rhinorrhea.     Right Turbinates: Enlarged and swollen.     Left Turbinates: Enlarged and swollen.     Right Sinus: No maxillary sinus tenderness or frontal sinus tenderness.     Left Sinus: No maxillary sinus tenderness or frontal sinus tenderness.     Comments: Clear rhinorrhea.    Mouth/Throat:     Mouth: Mucous membranes are not pale and not dry.     Pharynx: Uvula midline. No pharyngeal swelling, oropharyngeal exudate, posterior oropharyngeal erythema or uvula swelling.     Tonsils: 1+ on the right. 1+ on the left.  Eyes:     General:        Right eye: No discharge.        Left eye: No discharge.     Conjunctiva/sclera: Conjunctivae normal.     Right eye:  Right conjunctiva is not injected. No chemosis.    Left eye: Left conjunctiva is not injected. No chemosis.    Pupils: Pupils are equal, round, and reactive to light.  Cardiovascular:     Rate and Rhythm: Normal rate and regular rhythm.     Heart sounds: Normal heart sounds.  Pulmonary:     Effort: Pulmonary effort is normal. No tachypnea, accessory muscle usage or respiratory distress.     Breath sounds: Normal breath sounds. No wheezing, rhonchi or rales.  Chest:     Chest wall: No tenderness.  Abdominal:     Tenderness: There is no abdominal tenderness. There is no guarding or rebound.  Lymphadenopathy:     Head:     Right side of head: No submandibular, tonsillar or occipital adenopathy.     Left side of head: No submandibular, tonsillar or occipital adenopathy.     Cervical: No cervical adenopathy.  Skin:    Coloration: Skin is not pale.     Findings: No abrasion, erythema, petechiae or rash. Rash is not papular, urticarial or vesicular.  Neurological:     Mental Status: She is alert.  Psychiatric:        Behavior: Behavior is cooperative.      Diagnostic studies: none         Salvatore Marvel, MD  [facility] [place] [place]

## 2022-06-20 NOTE — Patient Instructions (Addendum)
1. Chronic rhinitis - with negative testing to the entire panel - Per your request, we are going to stop the carbinoxamine for ONE MONTH. - Start Xyzal (levocetirizine) one tablet once a day.  - Continue with your nose spray.   2. Mild persistent asthma, uncomplicated  - Lung testing not done today.  - We are not going to make any changes at this time.  - Daily controller medication(s): NOTHING - Prior to physical activity: albuterol 2 puffs 10-15 minutes before physical activity. - Rescue medications: albuterol 4 puffs every 4-6 hours as needed - Asthma control goals:  * Full participation in all desired activities (may need albuterol before activity) * Albuterol use two time or less a week on average (not counting use with activity) * Cough interfering with sleep two time or less a month * Oral steroids no more than once a year * No hospitalizations  3. Return to clinic in 6 months or earlier if needed.

## 2022-06-21 ENCOUNTER — Inpatient Hospital Stay (HOSPITAL_COMMUNITY)
Admission: EM | Admit: 2022-06-21 | Discharge: 2022-06-23 | DRG: 191 | Disposition: A | Discharge status: Home / Self Care | Payer: Medicare Other | Attending: Internal Medicine | Admitting: Internal Medicine

## 2022-06-21 ENCOUNTER — Emergency Department (HOSPITAL_COMMUNITY): Payer: Medicare Other

## 2022-06-21 ENCOUNTER — Other Ambulatory Visit: Payer: Self-pay

## 2022-06-21 ENCOUNTER — Encounter (HOSPITAL_COMMUNITY): Payer: Self-pay | Admitting: Emergency Medicine

## 2022-06-21 DIAGNOSIS — Z9841 Cataract extraction status, right eye: Secondary | ICD-10-CM

## 2022-06-21 DIAGNOSIS — M545 Low back pain, unspecified: Secondary | ICD-10-CM | POA: Diagnosis present

## 2022-06-21 DIAGNOSIS — I10 Essential (primary) hypertension: Secondary | ICD-10-CM | POA: Diagnosis not present

## 2022-06-21 DIAGNOSIS — T68XXXA Hypothermia, initial encounter: Secondary | ICD-10-CM | POA: Diagnosis not present

## 2022-06-21 DIAGNOSIS — J209 Acute bronchitis, unspecified: Secondary | ICD-10-CM | POA: Insufficient documentation

## 2022-06-21 DIAGNOSIS — Z681 Body mass index (BMI) 19 or less, adult: Secondary | ICD-10-CM

## 2022-06-21 DIAGNOSIS — Z79899 Other long term (current) drug therapy: Secondary | ICD-10-CM

## 2022-06-21 DIAGNOSIS — J9811 Atelectasis: Secondary | ICD-10-CM | POA: Diagnosis present

## 2022-06-21 DIAGNOSIS — Z20822 Contact with and (suspected) exposure to covid-19: Secondary | ICD-10-CM | POA: Diagnosis present

## 2022-06-21 DIAGNOSIS — E890 Postprocedural hypothyroidism: Secondary | ICD-10-CM | POA: Diagnosis not present

## 2022-06-21 DIAGNOSIS — Z8249 Family history of ischemic heart disease and other diseases of the circulatory system: Secondary | ICD-10-CM | POA: Diagnosis not present

## 2022-06-21 DIAGNOSIS — Z96651 Presence of right artificial knee joint: Secondary | ICD-10-CM | POA: Diagnosis present

## 2022-06-21 DIAGNOSIS — E441 Mild protein-calorie malnutrition: Secondary | ICD-10-CM | POA: Diagnosis present

## 2022-06-21 DIAGNOSIS — Z79890 Hormone replacement therapy (postmenopausal): Secondary | ICD-10-CM

## 2022-06-21 DIAGNOSIS — E785 Hyperlipidemia, unspecified: Secondary | ICD-10-CM | POA: Diagnosis present

## 2022-06-21 DIAGNOSIS — Z96611 Presence of right artificial shoulder joint: Secondary | ICD-10-CM | POA: Diagnosis present

## 2022-06-21 DIAGNOSIS — Z87891 Personal history of nicotine dependence: Secondary | ICD-10-CM | POA: Diagnosis not present

## 2022-06-21 DIAGNOSIS — R6510 Systemic inflammatory response syndrome (SIRS) of non-infectious origin without acute organ dysfunction: Secondary | ICD-10-CM | POA: Diagnosis not present

## 2022-06-21 DIAGNOSIS — E162 Hypoglycemia, unspecified: Secondary | ICD-10-CM | POA: Diagnosis not present

## 2022-06-21 DIAGNOSIS — Z9842 Cataract extraction status, left eye: Secondary | ICD-10-CM

## 2022-06-21 DIAGNOSIS — Z888 Allergy status to other drugs, medicaments and biological substances status: Secondary | ICD-10-CM

## 2022-06-21 DIAGNOSIS — Z66 Do not resuscitate: Secondary | ICD-10-CM | POA: Diagnosis present

## 2022-06-21 DIAGNOSIS — I2510 Atherosclerotic heart disease of native coronary artery without angina pectoris: Secondary | ICD-10-CM | POA: Diagnosis present

## 2022-06-21 DIAGNOSIS — J440 Chronic obstructive pulmonary disease with acute lower respiratory infection: Secondary | ICD-10-CM | POA: Diagnosis present

## 2022-06-21 DIAGNOSIS — M419 Scoliosis, unspecified: Secondary | ICD-10-CM | POA: Diagnosis present

## 2022-06-21 DIAGNOSIS — J439 Emphysema, unspecified: Secondary | ICD-10-CM | POA: Diagnosis not present

## 2022-06-21 DIAGNOSIS — R509 Fever, unspecified: Principal | ICD-10-CM

## 2022-06-21 DIAGNOSIS — K219 Gastro-esophageal reflux disease without esophagitis: Secondary | ICD-10-CM | POA: Diagnosis present

## 2022-06-21 DIAGNOSIS — G8929 Other chronic pain: Secondary | ICD-10-CM | POA: Diagnosis present

## 2022-06-21 DIAGNOSIS — J449 Chronic obstructive pulmonary disease, unspecified: Secondary | ICD-10-CM | POA: Diagnosis present

## 2022-06-21 DIAGNOSIS — Z7982 Long term (current) use of aspirin: Secondary | ICD-10-CM

## 2022-06-21 DIAGNOSIS — R000 Tachycardia, unspecified: Secondary | ICD-10-CM | POA: Diagnosis not present

## 2022-06-21 DIAGNOSIS — R069 Unspecified abnormalities of breathing: Secondary | ICD-10-CM | POA: Diagnosis not present

## 2022-06-21 DIAGNOSIS — R1319 Other dysphagia: Secondary | ICD-10-CM | POA: Diagnosis not present

## 2022-06-21 DIAGNOSIS — I4510 Unspecified right bundle-branch block: Secondary | ICD-10-CM | POA: Diagnosis present

## 2022-06-21 DIAGNOSIS — Z833 Family history of diabetes mellitus: Secondary | ICD-10-CM | POA: Diagnosis not present

## 2022-06-21 DIAGNOSIS — D509 Iron deficiency anemia, unspecified: Secondary | ICD-10-CM | POA: Diagnosis present

## 2022-06-21 DIAGNOSIS — E161 Other hypoglycemia: Secondary | ICD-10-CM | POA: Diagnosis not present

## 2022-06-21 DIAGNOSIS — R059 Cough, unspecified: Secondary | ICD-10-CM | POA: Diagnosis not present

## 2022-06-21 LAB — CBC WITH DIFFERENTIAL/PLATELET
Abs Immature Granulocytes: 0.09 10*3/uL — ABNORMAL HIGH (ref 0.00–0.07)
Basophils Absolute: 0.1 10*3/uL (ref 0.0–0.1)
Basophils Relative: 0 %
Eosinophils Absolute: 0.1 10*3/uL (ref 0.0–0.5)
Eosinophils Relative: 1 %
HCT: 31.1 % — ABNORMAL LOW (ref 36.0–46.0)
Hemoglobin: 10.1 g/dL — ABNORMAL LOW (ref 12.0–15.0)
Immature Granulocytes: 1 %
Lymphocytes Relative: 6 %
Lymphs Abs: 1.1 10*3/uL (ref 0.7–4.0)
MCH: 29.4 pg (ref 26.0–34.0)
MCHC: 32.5 g/dL (ref 30.0–36.0)
MCV: 90.7 fL (ref 80.0–100.0)
Monocytes Absolute: 1.8 10*3/uL — ABNORMAL HIGH (ref 0.1–1.0)
Monocytes Relative: 10 %
Neutro Abs: 14.5 10*3/uL — ABNORMAL HIGH (ref 1.7–7.7)
Neutrophils Relative %: 82 %
Platelets: 201 10*3/uL (ref 150–400)
RBC: 3.43 MIL/uL — ABNORMAL LOW (ref 3.87–5.11)
RDW: 13.6 % (ref 11.5–15.5)
WBC: 17.6 10*3/uL — ABNORMAL HIGH (ref 4.0–10.5)
nRBC: 0 % (ref 0.0–0.2)

## 2022-06-21 LAB — RESP PANEL BY RT-PCR (FLU A&B, COVID) ARPGX2
Influenza A by PCR: NEGATIVE
Influenza B by PCR: NEGATIVE
SARS Coronavirus 2 by RT PCR: NEGATIVE

## 2022-06-21 LAB — COMPREHENSIVE METABOLIC PANEL
ALT: 12 U/L (ref 0–44)
AST: 18 U/L (ref 15–41)
Albumin: 3.2 g/dL — ABNORMAL LOW (ref 3.5–5.0)
Alkaline Phosphatase: 58 U/L (ref 38–126)
Anion gap: 8 (ref 5–15)
BUN: 26 mg/dL — ABNORMAL HIGH (ref 8–23)
CO2: 24 mmol/L (ref 22–32)
Calcium: 8.8 mg/dL — ABNORMAL LOW (ref 8.9–10.3)
Chloride: 102 mmol/L (ref 98–111)
Creatinine, Ser: 1.03 mg/dL — ABNORMAL HIGH (ref 0.44–1.00)
GFR, Estimated: 52 mL/min — ABNORMAL LOW (ref >60)
Glucose, Bld: 121 mg/dL — ABNORMAL HIGH (ref 70–99)
Potassium: 3.9 mmol/L (ref 3.5–5.1)
Sodium: 134 mmol/L — ABNORMAL LOW (ref 135–145)
Total Bilirubin: 0.5 mg/dL (ref 0.3–1.2)
Total Protein: 7.3 g/dL (ref 6.5–8.1)

## 2022-06-21 LAB — LACTIC ACID, PLASMA
Lactic Acid, Venous: 1.2 mmol/L (ref 0.5–1.9)
Lactic Acid, Venous: 0.9 mmol/L (ref 0.5–1.9)

## 2022-06-21 MED ORDER — ACETAMINOPHEN 325 MG PO TABS
650.0000 mg | ORAL_TABLET | Freq: Once | ORAL | Status: AC | PRN
  Administered 2022-06-21: 650 mg via ORAL
  Filled 2022-06-21: qty 2

## 2022-06-21 NOTE — ED Notes (Signed)
Patient transported to X-ray 

## 2022-06-21 NOTE — ED Triage Notes (Signed)
Pt BIB ems complain of mucous in throat, cough and fever x 2 days. Pt denies any pain.

## 2022-06-21 NOTE — ED Provider Notes (Incomplete)
  Provider Note MRN:  [id number]  Arrival date & time: 06/21/22    ED Course and Medical Decision Making  Assumed care from Dr. Almyra Free at shift change.  Fever, cough, leukocytosis, also with some back pain.  Awaiting urinalysis and will reassess.  May need admission.  Procedures  Final Clinical Impressions(s) / ED Diagnoses     ICD-10-CM   1. Febrile illness  R50.9       ED Discharge Orders     None       Discharge Instructions   None     Barth Kirks. [place]onia Small, [place] [email or web address]
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
## 2022-06-22 ENCOUNTER — Encounter (HOSPITAL_COMMUNITY): Payer: Self-pay | Admitting: Family Medicine

## 2022-06-22 DIAGNOSIS — J449 Chronic obstructive pulmonary disease, unspecified: Secondary | ICD-10-CM | POA: Diagnosis present

## 2022-06-22 DIAGNOSIS — I4510 Unspecified right bundle-branch block: Secondary | ICD-10-CM | POA: Diagnosis present

## 2022-06-22 DIAGNOSIS — E441 Mild protein-calorie malnutrition: Secondary | ICD-10-CM | POA: Diagnosis present

## 2022-06-22 DIAGNOSIS — R1319 Other dysphagia: Secondary | ICD-10-CM

## 2022-06-22 DIAGNOSIS — J439 Emphysema, unspecified: Secondary | ICD-10-CM

## 2022-06-22 DIAGNOSIS — Z9842 Cataract extraction status, left eye: Secondary | ICD-10-CM | POA: Diagnosis not present

## 2022-06-22 DIAGNOSIS — R6510 Systemic inflammatory response syndrome (SIRS) of non-infectious origin without acute organ dysfunction: Secondary | ICD-10-CM

## 2022-06-22 DIAGNOSIS — Z20822 Contact with and (suspected) exposure to covid-19: Secondary | ICD-10-CM | POA: Diagnosis present

## 2022-06-22 DIAGNOSIS — M545 Low back pain, unspecified: Secondary | ICD-10-CM | POA: Diagnosis present

## 2022-06-22 DIAGNOSIS — I10 Essential (primary) hypertension: Secondary | ICD-10-CM | POA: Diagnosis present

## 2022-06-22 DIAGNOSIS — E890 Postprocedural hypothyroidism: Secondary | ICD-10-CM | POA: Diagnosis present

## 2022-06-22 DIAGNOSIS — J209 Acute bronchitis, unspecified: Secondary | ICD-10-CM | POA: Insufficient documentation

## 2022-06-22 DIAGNOSIS — G8929 Other chronic pain: Secondary | ICD-10-CM | POA: Diagnosis present

## 2022-06-22 DIAGNOSIS — Z66 Do not resuscitate: Secondary | ICD-10-CM | POA: Diagnosis present

## 2022-06-22 DIAGNOSIS — M419 Scoliosis, unspecified: Secondary | ICD-10-CM | POA: Diagnosis present

## 2022-06-22 DIAGNOSIS — J440 Chronic obstructive pulmonary disease with acute lower respiratory infection: Secondary | ICD-10-CM | POA: Diagnosis present

## 2022-06-22 DIAGNOSIS — J9811 Atelectasis: Secondary | ICD-10-CM | POA: Diagnosis present

## 2022-06-22 DIAGNOSIS — Z87891 Personal history of nicotine dependence: Secondary | ICD-10-CM | POA: Diagnosis not present

## 2022-06-22 DIAGNOSIS — K219 Gastro-esophageal reflux disease without esophagitis: Secondary | ICD-10-CM | POA: Diagnosis present

## 2022-06-22 DIAGNOSIS — D509 Iron deficiency anemia, unspecified: Secondary | ICD-10-CM | POA: Diagnosis present

## 2022-06-22 DIAGNOSIS — I2510 Atherosclerotic heart disease of native coronary artery without angina pectoris: Secondary | ICD-10-CM | POA: Diagnosis present

## 2022-06-22 DIAGNOSIS — Z9841 Cataract extraction status, right eye: Secondary | ICD-10-CM | POA: Diagnosis not present

## 2022-06-22 DIAGNOSIS — E785 Hyperlipidemia, unspecified: Secondary | ICD-10-CM | POA: Diagnosis present

## 2022-06-22 DIAGNOSIS — Z833 Family history of diabetes mellitus: Secondary | ICD-10-CM | POA: Diagnosis not present

## 2022-06-22 DIAGNOSIS — Z681 Body mass index (BMI) 19 or less, adult: Secondary | ICD-10-CM | POA: Diagnosis not present

## 2022-06-22 DIAGNOSIS — Z8249 Family history of ischemic heart disease and other diseases of the circulatory system: Secondary | ICD-10-CM | POA: Diagnosis not present

## 2022-06-22 LAB — TSH: TSH: 1.66 u[IU]/mL (ref 0.350–4.500)

## 2022-06-22 LAB — COMPREHENSIVE METABOLIC PANEL
ALT: 11 U/L (ref 0–44)
AST: 18 U/L (ref 15–41)
Albumin: 2.6 g/dL — ABNORMAL LOW (ref 3.5–5.0)
Alkaline Phosphatase: 45 U/L (ref 38–126)
Anion gap: 6 (ref 5–15)
BUN: 25 mg/dL — ABNORMAL HIGH (ref 8–23)
CO2: 25 mmol/L (ref 22–32)
Calcium: 8.4 mg/dL — ABNORMAL LOW (ref 8.9–10.3)
Chloride: 106 mmol/L (ref 98–111)
Creatinine, Ser: 1.02 mg/dL — ABNORMAL HIGH (ref 0.44–1.00)
GFR, Estimated: 52 mL/min — ABNORMAL LOW (ref >60)
Glucose, Bld: 104 mg/dL — ABNORMAL HIGH (ref 70–99)
Potassium: 3.6 mmol/L (ref 3.5–5.1)
Sodium: 137 mmol/L (ref 135–145)
Total Bilirubin: 0.1 mg/dL — ABNORMAL LOW (ref 0.3–1.2)
Total Protein: 6.1 g/dL — ABNORMAL LOW (ref 6.5–8.1)

## 2022-06-22 LAB — CBC WITH DIFFERENTIAL/PLATELET
Abs Immature Granulocytes: 0.07 10*3/uL (ref 0.00–0.07)
Abs Immature Granulocytes: 0.04 10*3/uL (ref 0.00–0.07)
Basophils Absolute: 0.1 10*3/uL (ref 0.0–0.1)
Basophils Absolute: 0.1 10*3/uL (ref 0.0–0.1)
Basophils Relative: 0 %
Basophils Relative: 1 %
Eosinophils Absolute: 0.2 10*3/uL (ref 0.0–0.5)
Eosinophils Absolute: 0.4 10*3/uL (ref 0.0–0.5)
Eosinophils Relative: 2 %
Eosinophils Relative: 4 %
HCT: 28.7 % — ABNORMAL LOW (ref 36.0–46.0)
HCT: 28.6 % — ABNORMAL LOW (ref 36.0–46.0)
Hemoglobin: 9 g/dL — ABNORMAL LOW (ref 12.0–15.0)
Hemoglobin: 9.2 g/dL — ABNORMAL LOW (ref 12.0–15.0)
Immature Granulocytes: 1 %
Immature Granulocytes: 0 %
Lymphocytes Relative: 13 %
Lymphocytes Relative: 10 %
Lymphs Abs: 1.7 10*3/uL (ref 0.7–4.0)
Lymphs Abs: 1.2 10*3/uL (ref 0.7–4.0)
MCH: 28.8 pg (ref 26.0–34.0)
MCH: 29.3 pg (ref 26.0–34.0)
MCHC: 31.4 g/dL (ref 30.0–36.0)
MCHC: 32.2 g/dL (ref 30.0–36.0)
MCV: 92 fL (ref 80.0–100.0)
MCV: 91.1 fL (ref 80.0–100.0)
Monocytes Absolute: 1.7 10*3/uL — ABNORMAL HIGH (ref 0.1–1.0)
Monocytes Absolute: 1.2 10*3/uL — ABNORMAL HIGH (ref 0.1–1.0)
Monocytes Relative: 12 %
Monocytes Relative: 10 %
Neutro Abs: 9.7 10*3/uL — ABNORMAL HIGH (ref 1.7–7.7)
Neutro Abs: 8.7 10*3/uL — ABNORMAL HIGH (ref 1.7–7.7)
Neutrophils Relative %: 72 %
Neutrophils Relative %: 75 %
Platelets: 158 10*3/uL (ref 150–400)
Platelets: 167 10*3/uL (ref 150–400)
RBC: 3.12 MIL/uL — ABNORMAL LOW (ref 3.87–5.11)
RBC: 3.14 MIL/uL — ABNORMAL LOW (ref 3.87–5.11)
RDW: 13.7 % (ref 11.5–15.5)
RDW: 13.6 % (ref 11.5–15.5)
WBC: 13.3 10*3/uL — ABNORMAL HIGH (ref 4.0–10.5)
WBC: 11.6 10*3/uL — ABNORMAL HIGH (ref 4.0–10.5)
nRBC: 0 % (ref 0.0–0.2)
nRBC: 0 % (ref 0.0–0.2)

## 2022-06-22 LAB — URINALYSIS, ROUTINE W REFLEX MICROSCOPIC
Bilirubin Urine: NEGATIVE
Glucose, UA: NEGATIVE mg/dL
Hgb urine dipstick: NEGATIVE
Ketones, ur: 5 mg/dL — AB
Leukocytes,Ua: NEGATIVE
Nitrite: NEGATIVE
Protein, ur: 100 mg/dL — AB
Specific Gravity, Urine: 1.021 (ref 1.005–1.030)
pH: 5 (ref 5.0–8.0)

## 2022-06-22 LAB — PROCALCITONIN
Procalcitonin: <0.1 ng/mL
Procalcitonin: <0.1 ng/mL

## 2022-06-22 LAB — MAGNESIUM: Magnesium: 2 mg/dL (ref 1.7–2.4)

## 2022-06-22 LAB — HIV ANTIBODY (ROUTINE TESTING W REFLEX): HIV Screen 4th Generation wRfx: NONREACTIVE

## 2022-06-22 MED ORDER — ACETAMINOPHEN 325 MG PO TABS
650.0000 mg | ORAL_TABLET | Freq: Four times a day (QID) | ORAL | Status: AC | PRN
  Administered 2022-06-22: 650 mg via ORAL
  Filled 2022-06-22: qty 2

## 2022-06-22 MED ORDER — PANTOPRAZOLE SODIUM 40 MG PO TBEC: 40.0000 mg | DELAYED_RELEASE_TABLET | Freq: Every day | ORAL | Status: DC

## 2022-06-22 MED ORDER — BENZONATATE 100 MG PO CAPS: 200.0000 mg | ORAL_CAPSULE | Freq: Three times a day (TID) | ORAL | Status: DC | PRN

## 2022-06-22 MED ORDER — SODIUM CHLORIDE 0.9 % IV SOLN
500.0000 mg | INTRAVENOUS | Status: DC
  Administered 2022-06-22: 500 mg via INTRAVENOUS
  Filled 2022-06-22: qty 5

## 2022-06-22 MED ORDER — SODIUM CHLORIDE 0.9 % IV SOLN: 500.0000 mg | INTRAVENOUS | Status: DC

## 2022-06-22 MED ORDER — ALBUTEROL SULFATE (2.5 MG/3ML) 0.083% IN NEBU: 2.5000 mg | INHALATION_SOLUTION | Freq: Four times a day (QID) | RESPIRATORY_TRACT | Status: DC | PRN

## 2022-06-22 MED ORDER — ALBUTEROL SULFATE HFA 108 (90 BASE) MCG/ACT IN AERS: 1.0000 | INHALATION_SPRAY | Freq: Four times a day (QID) | RESPIRATORY_TRACT | Status: DC | PRN

## 2022-06-22 MED ORDER — ASPIRIN 81 MG PO TBEC
81.0000 mg | DELAYED_RELEASE_TABLET | Freq: Every day | ORAL | Status: DC
  Administered 2022-06-22 – 2022-06-23 (×2): 81 mg via ORAL
  Filled 2022-06-22 (×2): qty 1

## 2022-06-22 MED ORDER — SODIUM CHLORIDE 0.9 % IV SOLN
2.0000 g | Freq: Once | INTRAVENOUS | Status: AC
  Administered 2022-06-22: 2 g via INTRAVENOUS
  Filled 2022-06-22: qty 20

## 2022-06-22 MED ORDER — SODIUM CHLORIDE 0.9 % IV SOLN: INTRAVENOUS | Status: DC

## 2022-06-22 MED ORDER — ENOXAPARIN SODIUM 30 MG/0.3ML IJ SOSY
30.0000 mg | PREFILLED_SYRINGE | INTRAMUSCULAR | Status: DC
  Administered 2022-06-22: 30 mg via SUBCUTANEOUS
  Filled 2022-06-22: qty 0.3

## 2022-06-22 MED ORDER — IPRATROPIUM-ALBUTEROL 0.5-2.5 (3) MG/3ML IN SOLN
3.0000 mL | RESPIRATORY_TRACT | Status: DC | PRN
Start: 2022-06-22 — End: 2022-06-23
  Filled 2022-06-22: qty 3

## 2022-06-22 MED ORDER — HEPARIN SODIUM (PORCINE) 5000 UNIT/ML IJ SOLN
5000.0000 [IU] | Freq: Three times a day (TID) | INTRAMUSCULAR | Status: DC
  Administered 2022-06-22: 5000 [IU] via SUBCUTANEOUS
  Filled 2022-06-22: qty 1

## 2022-06-22 MED ORDER — SODIUM CHLORIDE 0.9 % IV SOLN: 2.0000 g | INTRAVENOUS | Status: DC

## 2022-06-22 MED ORDER — FLUTICASONE FUROATE-VILANTEROL 100-25 MCG/ACT IN AEPB
1.0000 | INHALATION_SPRAY | Freq: Every day | RESPIRATORY_TRACT | Status: DC
  Administered 2022-06-22: 1 via RESPIRATORY_TRACT
  Filled 2022-06-22: qty 28

## 2022-06-22 MED ORDER — FERROUS SULFATE 325 (65 FE) MG PO TABS
325.0000 mg | ORAL_TABLET | Freq: Every day | ORAL | Status: DC
  Administered 2022-06-22 – 2022-06-23 (×2): 325 mg via ORAL
  Filled 2022-06-22 (×2): qty 1

## 2022-06-22 MED ORDER — GUAIFENESIN-DM 100-10 MG/5ML PO SYRP
5.0000 mL | ORAL_SOLUTION | ORAL | Status: DC | PRN
  Administered 2022-06-23: 5 mL via ORAL
  Filled 2022-06-22: qty 5

## 2022-06-22 MED ORDER — IPRATROPIUM-ALBUTEROL 0.5-2.5 (3) MG/3ML IN SOLN
3.0000 mL | Freq: Four times a day (QID) | RESPIRATORY_TRACT | Status: DC
  Administered 2022-06-22 – 2022-06-23 (×3): 3 mL via RESPIRATORY_TRACT
  Filled 2022-06-22 (×2): qty 3

## 2022-06-22 MED ORDER — IPRATROPIUM BROMIDE 0.06 % NA SOLN
2.0000 | Freq: Three times a day (TID) | NASAL | Status: DC
Start: 2022-06-22 — End: 2022-06-23
  Administered 2022-06-22 – 2022-06-23 (×4): 2 via NASAL
  Filled 2022-06-22: qty 15

## 2022-06-22 MED ORDER — UMECLIDINIUM BROMIDE 62.5 MCG/ACT IN AEPB
1.0000 | INHALATION_SPRAY | Freq: Every day | RESPIRATORY_TRACT | Status: DC
  Administered 2022-06-22: 1 via RESPIRATORY_TRACT
  Filled 2022-06-22: qty 7

## 2022-06-22 MED ORDER — SIMETHICONE 80 MG PO CHEW
80.0000 mg | CHEWABLE_TABLET | Freq: Once | ORAL | Status: DC
Start: 2022-06-22 — End: 2022-06-23

## 2022-06-22 MED ORDER — AZITHROMYCIN 250 MG PO TABS
500.0000 mg | ORAL_TABLET | Freq: Every day | ORAL | Status: DC
Start: 2022-06-23 — End: 2022-06-23
  Administered 2022-06-23: 500 mg via ORAL
  Filled 2022-06-22: qty 2

## 2022-06-22 MED ORDER — LEVOTHYROXINE SODIUM 88 MCG PO TABS
88.0000 ug | ORAL_TABLET | Freq: Every day | ORAL | Status: DC
  Administered 2022-06-22 – 2022-06-23 (×2): 88 ug via ORAL
  Filled 2022-06-22 (×2): qty 1

## 2022-06-22 MED ORDER — MOMETASONE FURO-FORMOTEROL FUM 200-5 MCG/ACT IN AERO
2.0000 | INHALATION_SPRAY | Freq: Two times a day (BID) | RESPIRATORY_TRACT | Status: DC
  Administered 2022-06-22 – 2022-06-23 (×2): 2 via RESPIRATORY_TRACT
  Filled 2022-06-22: qty 8.8

## 2022-06-22 NOTE — Care Management Important Message (Signed)
Important Message  Patient Details  Name: Christine Leblanc MRN: [id number] Date of Birth: 11-08-1929   Medicare Important Message Given:  Yes     Corey Harold 06/22/2022, 2:46 PM

## 2022-06-22 NOTE — Assessment & Plan Note (Addendum)
No clinical signs of pneumonia. (ruled out).  This am patient with improvement in her symptoms but not back to her baseline.  Wbc is down to 13.3 and procalcitonin is less than 10.   Plan: Discontinue ceftriaxone. Continue with oral azithromycin for 5 days for airway inflammation. Bronchodilator therapy and antitussive agents Airway clearing techniques with flutter valve and incentive spirometer.  Inhaled corticosteroids.  Continue to monitor oxygenation. Gentle hydration with IV fluids.

## 2022-06-23 DIAGNOSIS — J209 Acute bronchitis, unspecified: Secondary | ICD-10-CM | POA: Diagnosis not present

## 2022-06-23 DIAGNOSIS — J439 Emphysema, unspecified: Secondary | ICD-10-CM | POA: Diagnosis not present

## 2022-06-23 LAB — BASIC METABOLIC PANEL
Anion gap: 7 (ref 5–15)
BUN: 19 mg/dL (ref 8–23)
CO2: 20 mmol/L — ABNORMAL LOW (ref 22–32)
Calcium: 8.4 mg/dL — ABNORMAL LOW (ref 8.9–10.3)
Chloride: 112 mmol/L — ABNORMAL HIGH (ref 98–111)
Creatinine, Ser: 0.86 mg/dL (ref 0.44–1.00)
GFR, Estimated: >60 mL/min (ref >60)
Glucose, Bld: 91 mg/dL (ref 70–99)
Potassium: 3.6 mmol/L (ref 3.5–5.1)
Sodium: 139 mmol/L (ref 135–145)

## 2022-06-23 MED ORDER — METOPROLOL TARTRATE 5 MG/5ML IV SOLN: 5.0000 mg | INTRAVENOUS | Status: DC | PRN

## 2022-06-23 MED ORDER — HYDRALAZINE HCL 20 MG/ML IJ SOLN: 10.0000 mg | INTRAMUSCULAR | Status: DC | PRN

## 2022-06-23 MED ORDER — ALBUTEROL SULFATE HFA 108 (90 BASE) MCG/ACT IN AERS: 1.0000 | INHALATION_SPRAY | Freq: Four times a day (QID) | RESPIRATORY_TRACT | 0 refills | Status: DC | PRN

## 2022-06-23 MED ORDER — AZITHROMYCIN 250 MG PO TABS: 250.0000 mg | ORAL_TABLET | Freq: Every day | ORAL | 0 refills | Status: AC

## 2022-06-23 MED ORDER — SENNOSIDES-DOCUSATE SODIUM 8.6-50 MG PO TABS: 1.0000 | ORAL_TABLET | Freq: Every evening | ORAL | Status: DC | PRN

## 2022-06-23 MED ORDER — TRAZODONE HCL 50 MG PO TABS: 50.0000 mg | ORAL_TABLET | Freq: Every evening | ORAL | Status: DC | PRN

## 2022-06-23 NOTE — Plan of Care (Signed)
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
## 2022-06-23 NOTE — Progress Notes (Signed)
Patients IV fluids turned off per her request after calling multiple times about it. Dr. Camillo Flaming made aware. Patient states she pees every time she coughs and is unable to hold it and the fluids makes it worse. She doesn't feel like she needs the fluids and it it making "her situation worse"

## 2022-06-25 LAB — LEGIONELLA PNEUMOPHILA SEROGP 1 UR AG: L. pneumophila Serogp 1 Ur Ag: NEGATIVE

## 2022-06-26 LAB — CULTURE, BLOOD (ROUTINE X 2)
Culture: NO GROWTH
Culture: NO GROWTH

## 2022-07-02 NOTE — Progress Notes (Unsigned)
Cardiology Office Note:    Date:  07/04/2022   ID:  Christine Leblanc, DOB 08-Jan-1929, MRN [id number]  PCP:  Kathyrn Lass   [facility] Providers Cardiologist:  Carlyle Dolly, MD     Referring MD: No ref. provider found   Chief Complaint: hospital follow-up  History of Present Illness:    Christine Leblanc is a pleasant 88 y.o. female with a hx of hypertension, hyperlipidemia, hypothyroidism, palpitations (PACs and PVCs by prior monitor), stage III CKD, GERD, COPD, and hiatal hernia  During office visit with Christine Ku, PA on 05/20/2020 she reported episodes of near syncope.  She reported having issues with diarrhea in the interim and developed abdominal discomfort and fecal urgency with associated presyncope at that time.  No syncope.  She again had similar symptoms the following day with presyncope but no syncope.  Orthostatic vitals were checked in the office on 5/21 and were negative.  Echocardiogram and cardiac monitor for assessment of significant arrhythmia.  Echo revealed LVEF 70 to 75% with hyperdynamic function and no regional wall abnormalities, mild LVH, trivial MR, and mild aortic valve sclerosis without stenosis. No significant arrhythmias and no concern for bradycardia on cardiac monitor. TSH was low and she was informed to follow-up with PCP.  At follow-up visit 07/07/2020 with Christine Leblanc, Christine Leblanc she reported no further episodes of presyncope.  She admitted dehydration may have been a factor as she had been very active outside around that time.  Consideration of ILR was discussed if symptoms returned.  Her Synthroid dose had been adjusted and TSH had normalized at that visit.  He was advised to follow-up in 1 year.  Admission 6/22-6/24/23 for acute bronchitis.  Advised to follow-up with cardiology following discharge.  Today, she reports she is feeling weak. Walks with a cane. She is accompanied by her grandson. No presyncope, syncope. Has dysphagia and is struggling to find foods that  do not irritate her GERD and that are easy to swallow. Needs to increase protein consumption and is trying to gain weight but cannot seem to. Continues to have productive cough that causes chest soreness. She denies chest pain, shortness of breath, lower extremity edema, fatigue, palpitations, melena, hematuria, hemoptysis, diaphoresis, presyncope, syncope, orthopnea, and PND. Is not sure why she was told to follow-up with cardiology. She wants to keep her August follow-up in [place].   Past Medical History:  Diagnosis Date   Anemia    Asthma    Cancer ([place])    skin Ca- ? basal cell    Chronic back pain    Chronic neck pain    Degenerative joint disease    Left shoulder; cervical spine, knees & hands    Gastroesophageal reflux disease    Hiatal hernia; distal esophageal web requiring dilatation; gastric polyps; gastritis; refuses colonoscopy   GERD (gastroesophageal reflux disease)    History of stress test 1990's   stress test done under the care of Dr. Lattie Haw & Dr. Gwenlyn Found, now being followed by Dr. Harl Bowie- in [place] , recently seen & told to f/U in one yr.    Hyperlipidemia 04/27/2011   Hypertension    Hypothyroidism    Lymphocytic thyroiditis    Mild carotid artery disease ([place])    Multiple thyroid nodules 2010   Adenomatous; thyroidectomy in 2010   Orthostasis    Osteoarthritis of right knee 07/27/2014   Pneumonia    hosp. for pneumonia- long time ago    Primary localized osteoarthrosis of right shoulder 06/21/2015  Seizures ([place])    yes- as a child- & into adult years, states she took med. for them at one time, stopped at 32 yrs. of age    Syncope and collapse 2007   Possible CVA in 2012 with left lower extremity weakness; refused hospitalization; CT-Atrophy and chronic microvascular ischemic change.     Past Surgical History:  Procedure Laterality Date   ABDOMINAL HYSTERECTOMY     fibroids   CATARACT EXTRACTION     Bilateral; redo surgery on the right for  incomplete primary procedure   COLONOSCOPY  Remote   ESOPHAGOGASTRODUODENOSCOPY  11/2009   Dr. Oneida Alar: probable distal web s/p dilation small hiatal hernia/gastric polyps/mild gastritis, appeared to have narrowing at the junction of D1 and D2 dilated up to 12 mm   ESOPHAGOGASTRODUODENOSCOPY N/A 03/21/2015   Surgeon: Danie Binder, MD; proximal esophageal web s/p dilation, multiple gastric polyps s/p multiple biopsies, mild erosive gastritis s/p biopsy.  Pathology with fundic gland polyps, chronic gastritis, negative for H. pylori.   EYE SURGERY     PROLAPSED UTERINE FIBROID LIGATION     outcomed with rectocele & cystocele   SAVORY DILATION N/A 03/21/2015   Procedure: SAVORY DILATION;  Surgeon: Danie Binder, MD;  [facility];  Service: Endoscopy;  Laterality: N/A;   SHOULDER SURGERY     left   TOTAL KNEE ARTHROPLASTY Right 07/27/2014   Procedure: RIGHT TOTAL KNEE ARTHROPLASTY;  Surgeon: Johnny Bridge, MD;  Location: [place];  Service: Orthopedics;  Laterality: Right;   TOTAL SHOULDER ARTHROPLASTY Right 06/21/2015   Procedure: RIGHT TOTAL SHOULDER ARTHROPLASTY;  Surgeon: Marchia Bond, MD;  Location: [place];  Service: Orthopedics;  Laterality: Right;   TOTAL THYROIDECTOMY  2010    Current Medications: Current Meds  Medication Sig   albuterol (VENTOLIN HFA) 108 (90 Base) MCG/ACT inhaler Inhale 1-2 puffs into the lungs every 6 (six) hours as needed for wheezing or shortness of breath. Wheezing, Asthma Symptoms   aspirin EC 81 MG tablet Take 81 mg by mouth daily. Swallow whole.   benzonatate (TESSALON) 100 MG capsule Take 1-2 capsules (100-200 mg total) by mouth 3 (three) times daily as needed.   EPINEPHrine 0.3 mg/0.3 mL IJ SOAJ injection Inject into the muscle as needed for anaphylaxis.   ferrous sulfate 325 (65 FE) MG tablet Take 325 mg by mouth daily with breakfast.   Glucosamine-Chondroit-Vit C-Mn (GLUCOSAMINE 1500 COMPLEX PO) Take by mouth. Takes '1500mg'$  with Chonroitin  '1200mg'$  once daily arthritis pain relief- 8 hr caplet once as needed.   ipratropium (ATROVENT) 0.06 % nasal spray Place 2 sprays into both nostrils 3 (three) times daily.   levocetirizine (XYZAL) 5 MG tablet Take 1 tablet (5 mg total) by mouth every evening.   levothyroxine (SYNTHROID) 88 MCG tablet TAKE 1 TABLET DAILY ON MONDAY THROUGH SATURDAY AND ONE-HALF (1/2) TABLET ON SUNDAY   omeprazole (PRILOSEC) 20 MG capsule TAKE 1 CAPSULE DAILY BEFORE BREAKFAST   pravastatin (PRAVACHOL) 40 MG tablet TAKE 2 TABLETS DAILY   promethazine-dextromethorphan (PROMETHAZINE-DM) 6.25-15 MG/5ML syrup Take 5 mLs by mouth at bedtime as needed for cough.   senna (SENOKOT) 8.6 MG tablet Take 2 tablets by mouth at bedtime.    vitamin B-12 (CYANOCOBALAMIN) 1000 MCG tablet Take 1,000 mcg by mouth every morning.   vitamin C (ASCORBIC ACID) 500 MG tablet Take 500 mg by mouth daily.     Allergies:   Celecoxib, Dexilant [dexlansoprazole], and Famotidine   Social History   Socioeconomic History  Marital status: Widowed    Spouse name: Not on file   Number of children: 4   Years of education: 50   Highest education level: Not on file  Occupational History   Occupation: Artist    Comment: does not yield regular income   Occupation: Retired    Comment: Marine scientist  Tobacco Use   Smoking status: Former    Packs/day: 0.80    Years: 20.00    Total pack years: 16.00    Types: Cigarettes    Quit date: 12/31/1978    Years since quitting: 43.5   Smokeless tobacco: Never  Vaping Use   Vaping Use: Never used  Substance and Sexual Activity   Alcohol use: Yes    Comment: rarely   Drug use: No   Sexual activity: Never    Birth control/protection: Surgical    Comment: widowed since 2010  Other Topics Concern   Not on file  Social History Narrative   ** Merged History Encounter **       Lives at home. Her daughter lives with her.  Caffeine use: 1 cup coffee per day    Social Determinants of Health   Financial  Resource Strain: Not on file  Food Insecurity: Not on file  Transportation Needs: Not on file  Physical Activity: Not on file  Stress: Not on file  Social Connections: Not on file     Family History: The patient's family history includes Breast cancer in her sister; Diabetes in her maternal grandmother, mother, and sister; Gallbladder disease in her mother; Heart attack in her father; Heart disease in her mother; Heart failure in her mother. There is no history of Colon cancer or Stroke.  ROS:   Please see the history of present illness.    + weakness All other systems reviewed and are negative.  Labs/Other Studies Reviewed:    The following studies were reviewed today:  Cardiac monitor 08/2020  30 day event monitor Min HR 61, Avg HR 84, Max HR 129 Reported symptoms correlated with normal sinus rhythm No significant arrhythmias  Echo 06/07/20  1. Left ventricular ejection fraction, by estimation, is 70 to 75%. The  left ventricle has hyperdynamic function. The left ventricle has no  regional wall motion abnormalities. There is mild left ventricular  hypertrophy. Left ventricular diastolic  parameters are indeterminate.   2. Right ventricular systolic function is normal. The right ventricular  size is normal. There is normal pulmonary artery systolic pressure. The  estimated right ventricular systolic pressure is 67.3 mmHg.   3. The mitral valve is grossly normal. Trivial mitral valve  regurgitation.   4. The aortic valve is tricuspid. Aortic valve regurgitation is not  visualized. Mild aortic valve sclerosis is present, with no evidence of  aortic valve stenosis.   5. The inferior vena cava is normal in size with greater than 50%  respiratory variability, suggesting right atrial pressure of 3 mmHg.   Recent Labs: 06/22/2022: ALT 11; Hemoglobin 9.2; Magnesium 2.0; Platelets 167; TSH 1.660 06/23/2022: BUN 19; Creatinine, Ser 0.86; Potassium 3.6; Sodium 139  Recent Lipid  Panel    Component Value Date/Time   CHOL 150 12/11/2019 0000   TRIG 66 12/11/2019 0000   HDL 60 12/11/2019 0000   CHOLHDL 2.5 09/20/2016 0951   VLDL 10 09/20/2016 0951   LDLCALC 77 12/11/2019 0000     Risk Assessment/Calculations:           Physical Exam:    VS:  BP 120/62  Pulse 92   Ht '5\' 1"'$  (1.549 m)   Wt 97 lb 9.6 oz (44.3 kg)   SpO2 97%   BMI 18.44 kg/m     Wt Readings from Last 3 Encounters:  07/04/22 97 lb 9.6 oz (44.3 kg)  06/22/22 98 lb 8.7 oz (44.7 kg)  06/20/22 98 lb 6.4 oz (44.6 kg)     GEN: Frail elderly female in no acute distress HEENT: Normal NECK: No JVD; No carotid bruits CARDIAC: RRR, no murmurs, rubs, gallops RESPIRATORY:  Clear to auscultation without rales, wheezing or rhonchi  ABDOMEN: Soft, non-tender, non-distended MUSCULOSKELETAL:  No edema; No deformity. 2+ pedal pulses, equal bilaterally SKIN: Warm and dry NEUROLOGIC:  Alert and oriented x 3 PSYCHIATRIC:  Normal affect   EKG:  EKG is not ordered today.   Diagnoses:    1. Moderate protein-calorie malnutrition ([place])   2. Near syncope   3. Essential hypertension   4. Subacute cough   5. Other dysphagia    Assessment and Plan:     Cough: Recent admission for acute bronchitis. Continues to cough, but states it has improved.  Does not want to use some of the inhaler she has been given.  Advised to follow-up with pulmonary.  Presyncope: No reports of presyncope or syncope. It is unclear how active she is at home but she uses a cane for assistance now. She denies lightheadedness and BP is stable. Advised good hydration.   Hypertension: BP is well controlled.  Not currently on any antihypertensives due to history of hypotension.  Malnutrition/Dysphagia: We discussed options for her to increase caloric intake with her dysphagia. She is eager to gain weight.   Disposition: Considered PRN follow-up due to no true cardiac concerns but patient wants to keep August appointment with  Christine Pho, PA   Medication Adjustments/Labs and Tests Ordered: Current medicines are reviewed at length with the patient today.  Concerns regarding medicines are outlined above.  No orders of the defined types were placed in this encounter.  No orders of the defined types were placed in this encounter.   Patient Instructions  Medication Instructions:   Your physician recommends that you continue on your current medications as directed. Please refer to the Current Medication list given to you today.   *If you need a refill on your cardiac medications before your next appointment, please call your pharmacy*  Lab Work:  None ordered.  If you have labs (blood work) drawn today and your tests are completely normal, you will receive your results only by: [place] (if you have MyChart) OR A paper copy in the mail If you have any lab test that is abnormal or we need to change your treatment, we will call you to review the results.  Testing/Procedures:  None ordered.  Follow-Up: At [facility], you and your health needs are our priority.  As part of our continuing mission to provide you with exceptional heart care, we have created designated Provider Care Teams.  These Care Teams include your primary Cardiologist (physician) and Advanced Practice Providers (APPs -  Physician Assistants and Nurse Practitioners) who all work together to provide you with the care you need, when you need it.  We recommend signing up for the patient portal called "MyChart".  Sign up information is provided on this After Visit Summary.  MyChart is used to connect with patients for Virtual Visits (Telemedicine).  Patients are able to view lab/test results, encounter notes, upcoming appointments, etc.  Non-urgent messages can  be sent to your provider as well.   To learn more about what you can do with MyChart, go to [email or web address].    Your next appointment:   1 month(s)  The format  for your next appointment:   In Person  Provider:   Bernerd Pho, PA-C        Other Instructions  Please drink fluids mostly water total 64 fl oz daily.   Important Information About Sugar         Signed, Emmaline Life, NP  07/04/2022 9:18 PM    [place]

## 2022-07-04 ENCOUNTER — Ambulatory Visit (INDEPENDENT_AMBULATORY_CARE_PROVIDER_SITE_OTHER): Payer: Medicare Other | Admitting: Nurse Practitioner

## 2022-07-04 ENCOUNTER — Encounter: Payer: Self-pay | Admitting: Nurse Practitioner

## 2022-07-04 VITALS — BP 120/62 | HR 92 | Ht 61.0 in | Wt 97.6 lb

## 2022-07-04 DIAGNOSIS — I10 Essential (primary) hypertension: Secondary | ICD-10-CM

## 2022-07-04 DIAGNOSIS — R55 Syncope and collapse: Secondary | ICD-10-CM

## 2022-07-04 DIAGNOSIS — E440 Moderate protein-calorie malnutrition: Secondary | ICD-10-CM

## 2022-07-04 DIAGNOSIS — R1319 Other dysphagia: Secondary | ICD-10-CM

## 2022-07-04 DIAGNOSIS — R052 Subacute cough: Secondary | ICD-10-CM

## 2022-07-04 NOTE — Patient Instructions (Signed)
Medication Instructions:   Your physician recommends that you continue on your current medications as directed. Please refer to the Current Medication list given to you today.   *If you need a refill on your cardiac medications before your next appointment, please call your pharmacy*  Lab Work:  None ordered.  If you have labs (blood work) drawn today and your tests are completely normal, you will receive your results only by: [place] (if you have MyChart) OR A paper copy in the mail If you have any lab test that is abnormal or we need to change your treatment, we will call you to review the results.  Testing/Procedures:  None ordered.  Follow-Up: At [facility], you and your health needs are our priority.  As part of our continuing mission to provide you with exceptional heart care, we have created designated Provider Care Teams.  These Care Teams include your primary Cardiologist (physician) and Advanced Practice Providers (APPs -  Physician Assistants and Nurse Practitioners) who all work together to provide you with the care you need, when you need it.  We recommend signing up for the patient portal called "MyChart".  Sign up information is provided on this After Visit Summary.  MyChart is used to connect with patients for Virtual Visits (Telemedicine).  Patients are able to view lab/test results, encounter notes, upcoming appointments, etc.  Non-urgent messages can be sent to your provider as well.   To learn more about what you can do with MyChart, go to [email or web address].    Your next appointment:   1 month(s)  The format for your next appointment:   In Person  Provider:   Bernerd Pho, PA-C        Other Instructions  Please drink fluids mostly water total 64 fl oz daily.   Important Information About Sugar

## 2022-07-05 DIAGNOSIS — E782 Mixed hyperlipidemia: Secondary | ICD-10-CM | POA: Diagnosis not present

## 2022-07-05 DIAGNOSIS — J209 Acute bronchitis, unspecified: Secondary | ICD-10-CM | POA: Diagnosis not present

## 2022-07-05 DIAGNOSIS — R636 Underweight: Secondary | ICD-10-CM | POA: Diagnosis not present

## 2022-07-05 DIAGNOSIS — R5383 Other fatigue: Secondary | ICD-10-CM | POA: Diagnosis not present

## 2022-07-13 ENCOUNTER — Other Ambulatory Visit: Payer: Self-pay | Admitting: Internal Medicine

## 2022-07-13 ENCOUNTER — Telehealth: Payer: Self-pay

## 2022-07-13 DIAGNOSIS — K219 Gastro-esophageal reflux disease without esophagitis: Secondary | ICD-10-CM

## 2022-07-13 MED ORDER — OMEPRAZOLE 20 MG PO CPDR: 20.0000 mg | DELAYED_RELEASE_CAPSULE | Freq: Every day | ORAL | 3 refills | Status: DC

## 2022-07-13 NOTE — Telephone Encounter (Signed)
Pt LMOVM needing for her Rx for Omeprazole to be sent to [facility] in a 90 day supply ( 3 month supply) because of the cost. Please advise.

## 2022-07-13 NOTE — Telephone Encounter (Signed)
Done

## 2022-07-16 NOTE — Telephone Encounter (Signed)
Noted  Phoned and LMOVM regarding Rx being sent to pharmacy in a 90 day supply

## 2022-07-25 ENCOUNTER — Other Ambulatory Visit: Payer: Self-pay | Admitting: Allergy & Immunology

## 2022-07-26 ENCOUNTER — Other Ambulatory Visit: Payer: Self-pay | Admitting: Student

## 2022-07-26 DIAGNOSIS — R093 Abnormal sputum: Secondary | ICD-10-CM | POA: Diagnosis not present

## 2022-07-26 DIAGNOSIS — R1310 Dysphagia, unspecified: Secondary | ICD-10-CM | POA: Diagnosis not present

## 2022-07-31 DIAGNOSIS — H838X3 Other specified diseases of inner ear, bilateral: Secondary | ICD-10-CM | POA: Diagnosis not present

## 2022-07-31 DIAGNOSIS — H903 Sensorineural hearing loss, bilateral: Secondary | ICD-10-CM | POA: Diagnosis not present

## 2022-08-01 ENCOUNTER — Institutional Professional Consult (permissible substitution): Payer: Medicare Other | Admitting: Internal Medicine

## 2022-08-06 ENCOUNTER — Ambulatory Visit: Payer: Medicare Other | Admitting: Gastroenterology

## 2022-08-13 ENCOUNTER — Telehealth: Payer: Self-pay | Admitting: *Deleted

## 2022-08-13 DIAGNOSIS — R1313 Dysphagia, pharyngeal phase: Secondary | ICD-10-CM

## 2022-08-13 DIAGNOSIS — K219 Gastro-esophageal reflux disease without esophagitis: Secondary | ICD-10-CM

## 2022-08-13 DIAGNOSIS — R1310 Dysphagia, unspecified: Secondary | ICD-10-CM

## 2022-08-13 NOTE — Telephone Encounter (Signed)
Please refer to speech therapy specifically Genene Churn, thank you

## 2022-08-13 NOTE — Telephone Encounter (Signed)
error 

## 2022-08-13 NOTE — Telephone Encounter (Signed)
Patient called and would like to have a referral to speech pathologist at [facility]. She has seen Genene Churn before and would a referral to go back and see her one time. Pt feels that all she needs is just one visit.

## 2022-08-14 NOTE — Telephone Encounter (Signed)
Referral sent 

## 2022-08-14 NOTE — Addendum Note (Signed)
Addended by: Cheron Every on: 08/14/2022 07:27 AM   Modules accepted: Orders

## 2022-08-15 ENCOUNTER — Ambulatory Visit: Payer: Medicare Other | Admitting: Gastroenterology

## 2022-08-17 ENCOUNTER — Institutional Professional Consult (permissible substitution): Payer: Medicare Other | Admitting: Internal Medicine

## 2022-08-22 ENCOUNTER — Emergency Department (HOSPITAL_COMMUNITY)
Admission: EM | Admit: 2022-08-22 | Discharge: 2022-08-22 | Disposition: A | Discharge status: Home / Self Care | Payer: Medicare Other | Attending: Emergency Medicine | Admitting: Emergency Medicine

## 2022-08-22 ENCOUNTER — Encounter (HOSPITAL_COMMUNITY): Payer: Self-pay | Admitting: *Deleted

## 2022-08-22 ENCOUNTER — Other Ambulatory Visit: Payer: Self-pay

## 2022-08-22 DIAGNOSIS — I10 Essential (primary) hypertension: Secondary | ICD-10-CM | POA: Diagnosis not present

## 2022-08-22 DIAGNOSIS — S51812A Laceration without foreign body of left forearm, initial encounter: Secondary | ICD-10-CM | POA: Diagnosis not present

## 2022-08-22 DIAGNOSIS — Z7982 Long term (current) use of aspirin: Secondary | ICD-10-CM | POA: Diagnosis not present

## 2022-08-22 DIAGNOSIS — S51012A Laceration without foreign body of left elbow, initial encounter: Secondary | ICD-10-CM | POA: Diagnosis not present

## 2022-08-22 DIAGNOSIS — Z23 Encounter for immunization: Secondary | ICD-10-CM | POA: Diagnosis not present

## 2022-08-22 DIAGNOSIS — W19XXXA Unspecified fall, initial encounter: Secondary | ICD-10-CM | POA: Insufficient documentation

## 2022-08-22 DIAGNOSIS — S50312A Abrasion of left elbow, initial encounter: Secondary | ICD-10-CM | POA: Diagnosis not present

## 2022-08-22 DIAGNOSIS — R52 Pain, unspecified: Secondary | ICD-10-CM | POA: Diagnosis not present

## 2022-08-22 DIAGNOSIS — S59902A Unspecified injury of left elbow, initial encounter: Secondary | ICD-10-CM | POA: Diagnosis present

## 2022-08-22 DIAGNOSIS — T148XXA Other injury of unspecified body region, initial encounter: Secondary | ICD-10-CM

## 2022-08-22 MED ORDER — TETANUS-DIPHTH-ACELL PERTUSSIS 5-2.5-18.5 LF-MCG/0.5 IM SUSY
0.5000 mL | PREFILLED_SYRINGE | Freq: Once | INTRAMUSCULAR | Status: AC
Start: 2022-08-22 — End: 2022-08-22
  Administered 2022-08-22: 0.5 mL via INTRAMUSCULAR
  Filled 2022-08-22: qty 0.5

## 2022-08-22 NOTE — ED Triage Notes (Signed)
Pt brought in by [facility] from home with c/o fall. EMS reports skin tears to left arm and pain to left elbow. Denies hitting her head or using blood thinners. Pt was able to walk with assistance to the EMS stretcher.

## 2022-08-22 NOTE — Discharge Instructions (Signed)
Follow-up with your family doctor or an urgent care or return here in 2 days for recheck.  Keep bandage on till Friday

## 2022-08-23 ENCOUNTER — Ambulatory Visit: Payer: Medicare Other | Admitting: Endocrinology

## 2022-08-24 DIAGNOSIS — R0989 Other specified symptoms and signs involving the circulatory and respiratory systems: Secondary | ICD-10-CM | POA: Diagnosis not present

## 2022-08-24 DIAGNOSIS — T148XXD Other injury of unspecified body region, subsequent encounter: Secondary | ICD-10-CM | POA: Diagnosis not present

## 2022-08-24 DIAGNOSIS — E43 Unspecified severe protein-calorie malnutrition: Secondary | ICD-10-CM | POA: Diagnosis not present

## 2022-08-24 NOTE — ED Provider Notes (Signed)
[facility] EMERGENCY DEPARTMENT Provider Note   CSN: [id number] Arrival date & time: 08/22/22  1051     History  Chief Complaint  Patient presents with   Christine Leblanc is a 88 y.o. female.  Patient has a history of bronchospasm and GERD.  She had a fall in her left arm.  The history is provided by the patient and medical records. No language interpreter was used.  Fall This is a new problem. The current episode started 12 to 24 hours ago. The problem occurs rarely. The problem has been resolved. Pertinent negatives include no chest pain, no abdominal pain and no headaches. Nothing aggravates the symptoms. Nothing relieves the symptoms. She has tried nothing for the symptoms. The treatment provided no relief.       Home Medications Prior to Admission medications   Medication Sig Start Date End Date Taking? Authorizing Provider  albuterol (VENTOLIN HFA) 108 (90 Base) MCG/ACT inhaler Inhale 1-2 puffs into the lungs every 6 (six) hours as needed for wheezing or shortness of breath. Wheezing, Asthma Symptoms 06/23/22   Damita Lack, MD  aspirin EC 81 MG tablet Take 81 mg by mouth daily. Swallow whole.    [provider]  benzonatate (TESSALON) 100 MG capsule Take 1-2 capsules (100-200 mg total) by mouth 3 (three) times daily as needed. 01/15/22   Jaynee Eagles, PA-C  Carbinoxamine Maleate 4 MG TABS Take 1 tablet (4 mg total) by mouth every 8 (eight) hours. Patient taking differently: Take 1 tablet by mouth at bedtime. 06/09/21 06/22/22  Valentina Shaggy, MD  EPINEPHrine 0.3 mg/0.3 mL IJ SOAJ injection Inject into the muscle as needed for anaphylaxis. 08/13/18   [provider]  ferrous sulfate 325 (65 FE) MG tablet Take 325 mg by mouth daily with breakfast.    [provider]  Glucosamine-Chondroit-Vit C-Mn (GLUCOSAMINE 1500 COMPLEX PO) Take by mouth. Takes '1500mg'$  with Chonroitin '1200mg'$  once daily arthritis pain relief- 8 hr caplet once as  needed.    [provider]  ipratropium (ATROVENT) 0.06 % nasal spray USE 2 SPRAYS IN [facility] NOSTRIL THREE TIMES A DAY 07/25/22   Valentina Shaggy, MD  levocetirizine (XYZAL) 5 MG tablet Take 1 tablet (5 mg total) by mouth every evening. 06/20/22   Valentina Shaggy, MD  levothyroxine (SYNTHROID) 88 MCG tablet TAKE 1 TABLET DAILY ON MONDAY THROUGH SATURDAY AND ONE-HALF (1/2) TABLET ON SUNDAY 11/22/21   Elayne Snare, MD  omeprazole (PRILOSEC) 20 MG capsule Take 1 capsule (20 mg total) by mouth daily. 07/13/22 07/13/23  Eloise Harman, DO  pravastatin (PRAVACHOL) 40 MG tablet TAKE 2 TABLETS DAILY 07/26/22   Ahmed Prima, Fransisco Hertz, PA-C  promethazine-dextromethorphan (PROMETHAZINE-DM) 6.25-15 MG/5ML syrup Take 5 mLs by mouth at bedtime as needed for cough. 01/15/22   Jaynee Eagles, PA-C  senna (SENOKOT) 8.6 MG tablet Take 2 tablets by mouth at bedtime.     [provider]  TRELEGY ELLIPTA 100-62.5-25 MCG/ACT AEPB Inhale 1 puff into the lungs daily. Patient not taking: Reported on 07/04/2022 12/13/21   Valentina Shaggy, MD  vitamin B-12 (CYANOCOBALAMIN) 1000 MCG tablet Take 1,000 mcg by mouth every morning.    [provider]  vitamin C (ASCORBIC ACID) 500 MG tablet Take 500 mg by mouth daily.    [provider]      Allergies    Celecoxib, Dexilant [dexlansoprazole], and Famotidine    Review of Systems   Review of Systems  Constitutional:  Negative for appetite change and fatigue.  HENT:  Negative for congestion, ear discharge and sinus pressure.   Eyes:  Negative for discharge.  Respiratory:  Negative for cough.   Cardiovascular:  Negative for chest pain.  Gastrointestinal:  Negative for abdominal pain and diarrhea.  Genitourinary:  Negative for frequency and hematuria.  Musculoskeletal:  Negative for back pain.       Left arm injury  Skin:  Negative for rash.  Neurological:  Negative for seizures and headaches.  Psychiatric/Behavioral:  Negative for  hallucinations.     Physical Exam Updated Vital Signs BP (!) 141/76 (BP Location: Right Arm)   Pulse 81   Temp 97.7 F (36.5 C) (Oral)   Resp 17   Ht '5\' 1"'$  (1.549 m)   Wt 44 kg   SpO2 96%   BMI 18.33 kg/m  Physical Exam Vitals and nursing note reviewed.  Constitutional:      Appearance: She is well-developed.  HENT:     Head: Normocephalic.     Nose: Nose normal.  Eyes:     General: No scleral icterus.    Conjunctiva/sclera: Conjunctivae normal.  Neck:     Thyroid: No thyromegaly.  Cardiovascular:     Rate and Rhythm: Normal rate and regular rhythm.     Heart sounds: No murmur heard.    No friction rub. No gallop.  Pulmonary:     Breath sounds: No stridor. No wheezing or rales.  Chest:     Chest wall: No tenderness.  Abdominal:     General: There is no distension.     Tenderness: There is no abdominal tenderness. There is no rebound.  Musculoskeletal:        General: Normal range of motion.     Cervical back: Neck supple.  Lymphadenopathy:     Cervical: No cervical adenopathy.  Skin:    Findings: No erythema or rash.     Comments: Skin tears to left elbow and forearm  Neurological:     Mental Status: She is alert and oriented to person, place, and time.     Motor: No abnormal muscle tone.     Coordination: Coordination normal.  Psychiatric:        Behavior: Behavior normal.     ED Results / Procedures / Treatments   Labs (all labs ordered are listed, but only abnormal results are displayed) Labs Reviewed - No data to display  EKG None  Radiology No results found.  Procedures Procedures    Medications Ordered in ED Medications  Tdap (BOOSTRIX) injection 0.5 mL (0.5 mLs Intramuscular Given 08/22/22 1147)    ED Course/ Medical Decision Making/ A&P                           Medical Decision Making Risk Prescription drug management.  Patient with a fall causing skin tears to left arm and left elbow.  Area was cleaned thoroughly and bandaged  and she will follow-up        Final Clinical Impression(s) / ED Diagnoses Final diagnoses:  Abrasion  Fall, initial encounter    Rx / DC Orders ED Discharge Orders     None         Milton Ferguson, MD 08/24/22 1645

## 2022-08-25 ENCOUNTER — Emergency Department (HOSPITAL_COMMUNITY)
Admission: EM | Admit: 2022-08-25 | Discharge: 2022-08-25 | Disposition: A | Discharge status: Home / Self Care | Payer: Medicare Other | Attending: Emergency Medicine | Admitting: Emergency Medicine

## 2022-08-25 ENCOUNTER — Encounter (HOSPITAL_COMMUNITY): Payer: Self-pay | Admitting: Emergency Medicine

## 2022-08-25 ENCOUNTER — Other Ambulatory Visit: Payer: Self-pay

## 2022-08-25 DIAGNOSIS — Z5189 Encounter for other specified aftercare: Secondary | ICD-10-CM

## 2022-08-25 DIAGNOSIS — Z4802 Encounter for removal of sutures: Secondary | ICD-10-CM | POA: Insufficient documentation

## 2022-08-25 DIAGNOSIS — R5381 Other malaise: Secondary | ICD-10-CM | POA: Diagnosis not present

## 2022-08-25 DIAGNOSIS — W19XXXA Unspecified fall, initial encounter: Secondary | ICD-10-CM | POA: Diagnosis not present

## 2022-08-25 DIAGNOSIS — Z4801 Encounter for change or removal of surgical wound dressing: Secondary | ICD-10-CM | POA: Diagnosis not present

## 2022-08-25 DIAGNOSIS — T148XXA Other injury of unspecified body region, initial encounter: Secondary | ICD-10-CM | POA: Diagnosis not present

## 2022-08-25 NOTE — ED Notes (Signed)
Christine Leblanc Daughter [phone]

## 2022-08-25 NOTE — ED Provider Notes (Signed)
[facility] Provider Note   CSN: [id number] Arrival date & time: 08/25/22  1355     History {Add pertinent medical, surgical, social history, OB history to HPI:1} Chief Complaint  Patient presents with   Wound Check    Christine Leblanc is a 88 y.o. female here to have her bandages changed.  The patient was seen in the ED 3 days ago on August 23 after a fall with skin tear to her left elbow, this was dressed in the ED with Xeroform and then covered with Curlex and Coban.  The patient was unable to follow-up with her doctor yet.  She is upset because she was told that there would be "someone to come to the house to change the bandages in the lungs,".  She reports that the bandages were "bleeding through".  She is not on blood thinners.  HPI     Home Medications Prior to Admission medications   Medication Sig Start Date End Date Taking? Authorizing Provider  albuterol (VENTOLIN HFA) 108 (90 Base) MCG/ACT inhaler Inhale 1-2 puffs into the lungs every 6 (six) hours as needed for wheezing or shortness of breath. Wheezing, Asthma Symptoms 06/23/22   Damita Lack, MD  aspirin EC 81 MG tablet Take 81 mg by mouth daily. Swallow whole.    [provider]  benzonatate (TESSALON) 100 MG capsule Take 1-2 capsules (100-200 mg total) by mouth 3 (three) times daily as needed. 01/15/22   Jaynee Eagles, PA-C  Carbinoxamine Maleate 4 MG TABS Take 1 tablet (4 mg total) by mouth every 8 (eight) hours. Patient taking differently: Take 1 tablet by mouth at bedtime. 06/09/21 06/22/22  Valentina Shaggy, MD  EPINEPHrine 0.3 mg/0.3 mL IJ SOAJ injection Inject into the muscle as needed for anaphylaxis. 08/13/18   [provider]  ferrous sulfate 325 (65 FE) MG tablet Take 325 mg by mouth daily with breakfast.    [provider]  Glucosamine-Chondroit-Vit C-Mn (GLUCOSAMINE 1500 COMPLEX PO) Take by mouth. Takes '1500mg'$  with Chonroitin '1200mg'$  once daily arthritis pain  relief- 8 hr caplet once as needed.    [provider]  ipratropium (ATROVENT) 0.06 % nasal spray USE 2 SPRAYS IN [facility] NOSTRIL THREE TIMES A DAY 07/25/22   Valentina Shaggy, MD  levocetirizine (XYZAL) 5 MG tablet Take 1 tablet (5 mg total) by mouth every evening. 06/20/22   Valentina Shaggy, MD  levothyroxine (SYNTHROID) 88 MCG tablet TAKE 1 TABLET DAILY ON MONDAY THROUGH SATURDAY AND ONE-HALF (1/2) TABLET ON SUNDAY 11/22/21   Elayne Snare, MD  omeprazole (PRILOSEC) 20 MG capsule Take 1 capsule (20 mg total) by mouth daily. 07/13/22 07/13/23  Eloise Harman, DO  pravastatin (PRAVACHOL) 40 MG tablet TAKE 2 TABLETS DAILY 07/26/22   Ahmed Prima, Fransisco Hertz, PA-C  promethazine-dextromethorphan (PROMETHAZINE-DM) 6.25-15 MG/5ML syrup Take 5 mLs by mouth at bedtime as needed for cough. 01/15/22   Jaynee Eagles, PA-C  senna (SENOKOT) 8.6 MG tablet Take 2 tablets by mouth at bedtime.     [provider]  TRELEGY ELLIPTA 100-62.5-25 MCG/ACT AEPB Inhale 1 puff into the lungs daily. Patient not taking: Reported on 07/04/2022 12/13/21   Valentina Shaggy, MD  vitamin B-12 (CYANOCOBALAMIN) 1000 MCG tablet Take 1,000 mcg by mouth every morning.    [provider]  vitamin C (ASCORBIC ACID) 500 MG tablet Take 500 mg by mouth daily.    [provider]      Allergies    Celecoxib, Dexilant [  dexlansoprazole], and Famotidine    Review of Systems   Review of Systems  Physical Exam Updated Vital Signs BP 138/67   Pulse 94   Resp 16   Ht '5\' 1"'$  (1.549 m)   Wt 44 kg   SpO2 100%   BMI 18.33 kg/m  Physical Exam Constitutional:      General: She is not in acute distress. HENT:     Head: Normocephalic and atraumatic.  Eyes:     Conjunctiva/sclera: Conjunctivae normal.     Pupils: Pupils are equal, round, and reactive to light.  Cardiovascular:     Rate and Rhythm: Normal rate and regular rhythm.  Pulmonary:     Effort: Pulmonary effort is normal. No respiratory  distress.  Skin:    General: Skin is warm and dry.     Comments: Skin abrasions and skin tears noted to the left arm, which are not actively bleeding, were appropriately dressed on the patient's arrival of Xeroform and Coban without evident bleeding.  Neurological:     General: No focal deficit present.     Mental Status: She is alert. Mental status is at baseline.     ED Results / Procedures / Treatments   Labs (all labs ordered are listed, but only abnormal results are displayed) Labs Reviewed - No data to display  EKG None  Radiology No results found.  Procedures Procedures  {Document cardiac monitor, telemetry assessment procedure when appropriate:1}  Medications Ordered in ED Medications - No data to display  ED Course/ Medical Decision Making/ A&P                           Medical Decision Making  Dressings exchanged - wound is hemodynamically stable, does not appear infected.  TOC consulted.  I advised patient if we cannot get home wound care set up by next week she could also go to her PCP's office early next week to have bandages removed and exchanged.  {Document critical care time when appropriate:1} {Document review of labs and clinical decision tools ie heart score, Chads2Vasc2 etc:1}  {Document your independent review of radiology images, and any outside records:1} {Document your discussion with family members, caretakers, and with consultants:1} {Document social determinants of health affecting pt's care:1} {Document your decision making why or why not admission, treatments were needed:1} Final Clinical Impression(s) / ED Diagnoses Final diagnoses:  None    Rx / DC Orders ED Discharge Orders     None

## 2022-08-25 NOTE — ED Notes (Signed)
Lorna Few called to come pick pt up from ED @ [phone]

## 2022-08-25 NOTE — Discharge Instructions (Addendum)
Instructions   Your bandages do NOT need to be changed every single day.  You should call your doctor's office on Monday and talk to them about coming back into the office at end of the week to have the bandages changed.  The bandages should be changed once every 5-7 days, and the wound will likely take a month to heal.  Talk to your doctor's office if you need help arranging for home health or wound care.

## 2022-08-25 NOTE — ED Triage Notes (Signed)
Pt to the ED via [facility] with a request to have her bandages changed after being seen here 3 days ago with skin tears from a fall.

## 2022-08-27 ENCOUNTER — Institutional Professional Consult (permissible substitution): Payer: Medicare Other | Admitting: Internal Medicine

## 2022-08-27 DIAGNOSIS — W19XXXD Unspecified fall, subsequent encounter: Secondary | ICD-10-CM | POA: Diagnosis not present

## 2022-08-27 DIAGNOSIS — S41112D Laceration without foreign body of left upper arm, subsequent encounter: Secondary | ICD-10-CM | POA: Diagnosis not present

## 2022-08-27 DIAGNOSIS — Z681 Body mass index (BMI) 19 or less, adult: Secondary | ICD-10-CM | POA: Diagnosis not present

## 2022-08-27 DIAGNOSIS — R1312 Dysphagia, oropharyngeal phase: Secondary | ICD-10-CM | POA: Diagnosis not present

## 2022-08-27 DIAGNOSIS — E46 Unspecified protein-calorie malnutrition: Secondary | ICD-10-CM | POA: Diagnosis not present

## 2022-08-27 DIAGNOSIS — J45909 Unspecified asthma, uncomplicated: Secondary | ICD-10-CM | POA: Diagnosis not present

## 2022-08-27 DIAGNOSIS — Z8616 Personal history of COVID-19: Secondary | ICD-10-CM | POA: Diagnosis not present

## 2022-08-27 DIAGNOSIS — E782 Mixed hyperlipidemia: Secondary | ICD-10-CM | POA: Diagnosis not present

## 2022-08-27 DIAGNOSIS — Z9181 History of falling: Secondary | ICD-10-CM | POA: Diagnosis not present

## 2022-08-27 DIAGNOSIS — R5383 Other fatigue: Secondary | ICD-10-CM | POA: Diagnosis not present

## 2022-08-28 ENCOUNTER — Telehealth: Payer: Self-pay | Admitting: Cardiology

## 2022-08-28 NOTE — Telephone Encounter (Signed)
Pt states that she had a fall, She had to cancel appt for tomorrow and would like to know if she is able to have a Telephone appt. Please advise

## 2022-08-28 NOTE — Telephone Encounter (Signed)
Pt scheduled for VV with provider.

## 2022-08-28 NOTE — Progress Notes (Unsigned)
Virtual Visit via Telephone Note   Because of Christine Leblanc co-morbid illnesses, she is at least at moderate risk for complications without adequate follow up.  This format is felt to be most appropriate for this patient at this time.  The patient did not have access to video technology/had technical difficulties with video requiring transitioning to audio format only (telephone).  All issues noted in this document were discussed and addressed.  No physical exam could be performed with this format.  Please refer to the patient's chart for her consent to telehealth for [facility].    Date:  08/30/2022   ID:  Christine Leblanc, DOB 06/22/1929, MRN [id number] The patient was identified using 2 identifiers.  Patient Location: Home Provider Location: Office/Clinic   PCP:  Celene Squibb, [place] [facility] Cardiologist:  Carlyle Dolly, MD     Evaluation Performed:  Follow-Up Visit  Chief Complaint: Recent fall  History of Present Illness:    Christine Leblanc is a 88 y.o. female with with past medical history of HLD, Hypothyroidism, palpitations (PAC's and PVC's by prior monitor), Stage III CKD, GERD and hiatal hernia who presents for a follow-up telehealth visit.   She was examined by myself in 06/2020 and had recently been evaluated for episodes of near syncope with an echocardiogram and event monitor recommended for additional evaluation. Her echo showed a preserved EF with only trivial MR and her monitor showed predominately NSR with no significant arrhythmias. She felt her prior episodes were possibly secondary to dehydration and she had increased her fluid intake.   She did follow-up with Florene Glen, NP in 06/2022 for hospital follow-up following a recent admission for viral bronchitis. Reported feeling weak at that time but denied any specific cardiac issues. She most recently presented to [facility] ED on 08/22/2022 after suffering a fall and had skin tears  but no significant complications.   In talking with the patient today, she reports having a fall last week and says she had not consumed foods or liquids that morning and feels like this contributed to her symptoms. Denies any associated dizziness or presyncope at that time. She did have skin tears along her left arm and dressings are being changed by home health. She reports continued weight loss and has been trying to consume protein shakes to offset this. Says that she does not eat or drink as much as she should secondary to dysphagia. She ambulates with a walker and denies any specific chest pain or dyspnea on exertion. No recent orthopnea, PND, pitting edema or recurrent palpitations.   Past Medical History:  Diagnosis Date   Anemia    Asthma    Cancer ([place])    skin Ca- ? basal cell    Chronic back pain    Chronic neck pain    Degenerative joint disease    Left shoulder; cervical spine, knees & hands    Gastroesophageal reflux disease    Hiatal hernia; distal esophageal web requiring dilatation; gastric polyps; gastritis; refuses colonoscopy   GERD (gastroesophageal reflux disease)    History of stress test 1990's   stress test done under the care of Dr. Lattie Haw & Dr. Gwenlyn Found, now being followed by Dr. Harl Bowie- in [place] , recently seen & told to f/U in one yr.    Hyperlipidemia 04/27/2011   Hypertension    Hypothyroidism    Lymphocytic thyroiditis    Mild carotid artery disease (HCC)    Multiple thyroid  nodules 2010   Adenomatous; thyroidectomy in 2010   Orthostasis    Osteoarthritis of right knee 07/27/2014   Pneumonia    hosp. for pneumonia- long time ago    Primary localized osteoarthrosis of right shoulder 06/21/2015   Seizures ([place])    yes- as a child- & into adult years, states she took med. for them at one time, stopped at 32 yrs. of age    Syncope and collapse 2007   Possible CVA in 2012 with left lower extremity weakness; refused hospitalization; CT-Atrophy and  chronic microvascular ischemic change.    Past Surgical History:  Procedure Laterality Date   ABDOMINAL HYSTERECTOMY     fibroids   CATARACT EXTRACTION     Bilateral; redo surgery on the right for incomplete primary procedure   COLONOSCOPY  Remote   ESOPHAGOGASTRODUODENOSCOPY  11/2009   Dr. Oneida Alar: probable distal web s/p dilation small hiatal hernia/gastric polyps/mild gastritis, appeared to have narrowing at the junction of D1 and D2 dilated up to 12 mm   ESOPHAGOGASTRODUODENOSCOPY N/A 03/21/2015   Surgeon: Danie Binder, MD; proximal esophageal web s/p dilation, multiple gastric polyps s/p multiple biopsies, mild erosive gastritis s/p biopsy.  Pathology with fundic gland polyps, chronic gastritis, negative for H. pylori.   EYE SURGERY     PROLAPSED UTERINE FIBROID LIGATION     outcomed with rectocele & cystocele   SAVORY DILATION N/A 03/21/2015   Procedure: SAVORY DILATION;  Surgeon: Danie Binder, MD;  [facility];  Service: Endoscopy;  Laterality: N/A;   SHOULDER SURGERY     left   TOTAL KNEE ARTHROPLASTY Right 07/27/2014   Procedure: RIGHT TOTAL KNEE ARTHROPLASTY;  Surgeon: Johnny Bridge, MD;  Location: [place];  Service: Orthopedics;  Laterality: Right;   TOTAL SHOULDER ARTHROPLASTY Right 06/21/2015   Procedure: RIGHT TOTAL SHOULDER ARTHROPLASTY;  Surgeon: Marchia Bond, MD;  Location: [place];  Service: Orthopedics;  Laterality: Right;   TOTAL THYROIDECTOMY  2010     Current Meds  Medication Sig   albuterol (VENTOLIN HFA) 108 (90 Base) MCG/ACT inhaler Inhale 1-2 puffs into the lungs every 6 (six) hours as needed for wheezing or shortness of breath. Wheezing, Asthma Symptoms   aspirin EC 81 MG tablet Take 81 mg by mouth daily. Swallow whole.   Carbinoxamine Maleate 4 MG TABS Take 1 tablet (4 mg total) by mouth every 8 (eight) hours. (Patient taking differently: Take 1 tablet by mouth at bedtime.)   EPINEPHrine 0.3 mg/0.3 mL IJ SOAJ injection Inject into the muscle  as needed for anaphylaxis.   ferrous sulfate 325 (65 FE) MG tablet Take 325 mg by mouth daily with breakfast.   Glucosamine-Chondroit-Vit C-Mn (GLUCOSAMINE 1500 COMPLEX PO) Take by mouth. Takes '1500mg'$  with Chonroitin '1200mg'$  once daily arthritis pain relief- 8 hr caplet once as needed.   ipratropium (ATROVENT) 0.06 % nasal spray USE 2 SPRAYS IN EACH NOSTRIL THREE TIMES A DAY   levothyroxine (SYNTHROID) 88 MCG tablet TAKE 1 TABLET DAILY ON MONDAY THROUGH SATURDAY AND ONE-HALF (1/2) TABLET ON SUNDAY   omeprazole (PRILOSEC) 20 MG capsule Take 1 capsule (20 mg total) by mouth daily.   pravastatin (PRAVACHOL) 40 MG tablet TAKE 2 TABLETS DAILY   promethazine-dextromethorphan (PROMETHAZINE-DM) 6.25-15 MG/5ML syrup Take 5 mLs by mouth at bedtime as needed for cough.   senna (SENOKOT) 8.6 MG tablet Take 2 tablets by mouth at bedtime.    vitamin B-12 (CYANOCOBALAMIN) 1000 MCG tablet Take 1,000 mcg by mouth every morning.  vitamin C (ASCORBIC ACID) 500 MG tablet Take 500 mg by mouth daily.     Allergies:   Celecoxib, Dexilant [dexlansoprazole], and Famotidine   Social History   Tobacco Use   Smoking status: Former    Packs/day: 0.80    Years: 20.00    Total pack years: 16.00    Types: Cigarettes    Quit date: 12/31/1978    Years since quitting: 43.6   Smokeless tobacco: Never  Vaping Use   Vaping Use: Never used  Substance Use Topics   Alcohol use: Yes    Comment: rarely   Drug use: No     Family Hx: The patient's family history includes Breast cancer in her sister; Diabetes in her maternal grandmother, mother, and sister; Gallbladder disease in her mother; Heart attack in her father; Heart disease in her mother; Heart failure in her mother. There is no history of Colon cancer or Stroke.  ROS:   Please see the history of present illness.     All other systems reviewed and are negative.   Prior CV studies:   The following studies were reviewed today:  Event Monitor: 05/2020 30 day  event monitor Min HR 61, Avg HR 84, Max HR 129 Reported symptoms correlated with normal sinus rhythm No significant arrhythmias  Echocardiogram: 05/2020 IMPRESSIONS     1. Left ventricular ejection fraction, by estimation, is 70 to 75%. The  left ventricle has hyperdynamic function. The left ventricle has no  regional wall motion abnormalities. There is mild left ventricular  hypertrophy. Left ventricular diastolic  parameters are indeterminate.   2. Right ventricular systolic function is normal. The right ventricular  size is normal. There is normal pulmonary artery systolic pressure. The  estimated right ventricular systolic pressure is 39.7 mmHg.   3. The mitral valve is grossly normal. Trivial mitral valve  regurgitation.   4. The aortic valve is tricuspid. Aortic valve regurgitation is not  visualized. Mild aortic valve sclerosis is present, with no evidence of  aortic valve stenosis.   5. The inferior vena cava is normal in size with greater than 50%  respiratory variability, suggesting right atrial pressure of 3 mmHg.   Labs/Other Tests and Data Reviewed:    EKG:  An ECG dated 06/21/2022 was personally reviewed today and demonstrated:  Sinus tachycardia, HR 110 with known RBBB.   Recent Labs: 06/22/2022: ALT 11; Hemoglobin 9.2; Magnesium 2.0; Platelets 167; TSH 1.660 06/23/2022: BUN 19; Creatinine, Ser 0.86; Potassium 3.6; Sodium 139   Recent Lipid Panel Lab Results  Component Value Date/Time   CHOL 150 12/11/2019 12:00 AM   TRIG 66 12/11/2019 12:00 AM   HDL 60 12/11/2019 12:00 AM   CHOLHDL 2.5 09/20/2016 09:51 AM   LDLCALC 77 12/11/2019 12:00 AM    Wt Readings from Last 3 Encounters:  08/29/22 94 lb (42.6 kg)  08/25/22 97 lb (44 kg)  08/22/22 97 lb (44 kg)        Objective:    Vital Signs:  BP 138/67   Ht '5\' 1"'$  (1.549 m)   Wt 94 lb (42.6 kg)   BMI 17.76 kg/m    General: Pleasant female sounding in NAD Psych: Normal affect. Neuro: Alert and oriented X  3. Lungs:  Resp regular and unlabored while talking on the phone.    ASSESSMENT & PLAN:    1. Palpitations - She had PAC's and PVC's by prior monitor but denies any recent palpitations and HR has been well-controlled. She is not on  AV nodal blocking agents. No indication for further testing or additional medical therapy at this time.   2. Hypothyroidism - TSH was at 1.660 in 05/2022. Remains on Synthroid which is managed by her PCP.   3. HLD - Followed by her PCP. We will request a copy of most recent labs. Remains on Pravastatin '80mg'$  daily (takes '40mg'$  tablets since they are smaller and easier to swallow in the setting of dysphagia).   4. Stage 3 CKD - Baseline creatinine 1.0 - 1.1. Improved to 0.86 in 05/2022. We did review the importance of adequate hydration and she has been trying to increase her fluid and protein intake.     Time:   Today, I have spent 18 minutes with the patient with telehealth technology discussing the above problems.     Medication Adjustments/Labs and Tests Ordered: Current medicines are reviewed at length with the patient today.  Concerns regarding medicines are outlined above.   Tests Ordered: No orders of the defined types were placed in this encounter.   Medication Changes: No orders of the defined types were placed in this encounter.   Follow Up: 1 year with myself or Dr. Harl Bowie  Signed, Erma Heritage, PA-C  08/30/2022 10:23 AM    [place]

## 2022-08-29 ENCOUNTER — Ambulatory Visit: Payer: Medicare Other | Admitting: Student

## 2022-08-29 ENCOUNTER — Encounter: Payer: Self-pay | Admitting: Student

## 2022-08-29 ENCOUNTER — Encounter: Payer: Self-pay | Admitting: Internal Medicine

## 2022-08-29 ENCOUNTER — Ambulatory Visit: Payer: Medicare Other | Attending: Student | Admitting: Student

## 2022-08-29 ENCOUNTER — Telehealth: Payer: Self-pay

## 2022-08-29 VITALS — BP 138/67 | Ht 61.0 in | Wt 94.0 lb

## 2022-08-29 DIAGNOSIS — N1831 Chronic kidney disease, stage 3a: Secondary | ICD-10-CM | POA: Insufficient documentation

## 2022-08-29 DIAGNOSIS — R002 Palpitations: Secondary | ICD-10-CM | POA: Insufficient documentation

## 2022-08-29 DIAGNOSIS — E039 Hypothyroidism, unspecified: Secondary | ICD-10-CM | POA: Insufficient documentation

## 2022-08-29 DIAGNOSIS — E785 Hyperlipidemia, unspecified: Secondary | ICD-10-CM | POA: Diagnosis not present

## 2022-08-29 NOTE — Telephone Encounter (Signed)
  Patient Consent for Virtual Visit    [id number]}   Christine Leblanc has provided verbal consent on 08/29/2022 for a virtual visit (video or telephone).   CONSENT FOR VIRTUAL VISIT FOR:  Christine Leblanc  By participating in this virtual visit I agree to the following:  I hereby voluntarily request, consent and authorize [facility] and its employed or contracted physicians, physician assistants, nurse practitioners or other licensed health care professionals (the Practitioner), to provide me with telemedicine health care services (the "Services") as deemed necessary by the treating Practitioner. I acknowledge and consent to receive the Services by the Practitioner via telemedicine. I understand that the telemedicine visit will involve communicating with the Practitioner through live audiovisual communication technology and the disclosure of certain medical information by electronic transmission. I acknowledge that I have been given the opportunity to request an in-person assessment or other available alternative prior to the telemedicine visit and am voluntarily participating in the telemedicine visit.  I understand that I have the right to withhold or withdraw my consent to the use of telemedicine in the course of my care at any time, without affecting my right to future care or treatment, and that the Practitioner or I may terminate the telemedicine visit at any time. I understand that I have the right to inspect all information obtained and/or recorded in the course of the telemedicine visit and may receive copies of available information for a reasonable fee.  I understand that some of the potential risks of receiving the Services via telemedicine include:  Delay or interruption in medical evaluation due to technological equipment failure or disruption; Information transmitted may not be sufficient (e.g. poor resolution of images) to allow for appropriate medical decision making by the  Practitioner; and/or  In rare instances, security protocols could fail, causing a breach of personal health information.  Furthermore, I acknowledge that it is my responsibility to provide information about my medical history, conditions and care that is complete and accurate to the best of my ability. I acknowledge that Practitioner's advice, recommendations, and/or decision may be based on factors not within their control, such as incomplete or inaccurate data provided by me or distortions of diagnostic images or specimens that may result from electronic transmissions. I understand that the practice of medicine is not an exact science and that Practitioner makes no warranties or guarantees regarding treatment outcomes. I acknowledge that a copy of this consent can be made available to me via my patient portal ([facility]), or I can request a printed copy by calling the office of [facility].    I understand that my insurance will be billed for this visit.   I have read or had this consent read to me. I understand the contents of this consent, which adequately explains the benefits and risks of the Services being provided via telemedicine.  I have been provided ample opportunity to ask questions regarding this consent and the Services and have had my questions answered to my satisfaction. I give my informed consent for the services to be provided through the use of telemedicine in my medical care

## 2022-08-29 NOTE — Patient Instructions (Signed)
Medication Instructions:  Your physician recommends that you continue on your current medications as directed. Please refer to the Current Medication list given to you today.   Labwork: None  Testing/Procedures: None  Follow-Up: Follow up in 1 year with [place], PA-C or Dr. Harl Bowie.   Any Other Special Instructions Will Be Listed Below (If Applicable).     If you need a refill on your cardiac medications before your next appointment, please call your pharmacy.

## 2022-08-30 DIAGNOSIS — E46 Unspecified protein-calorie malnutrition: Secondary | ICD-10-CM | POA: Diagnosis not present

## 2022-08-30 DIAGNOSIS — E782 Mixed hyperlipidemia: Secondary | ICD-10-CM | POA: Diagnosis not present

## 2022-08-30 DIAGNOSIS — J45909 Unspecified asthma, uncomplicated: Secondary | ICD-10-CM | POA: Diagnosis not present

## 2022-08-30 DIAGNOSIS — R5383 Other fatigue: Secondary | ICD-10-CM | POA: Diagnosis not present

## 2022-08-30 DIAGNOSIS — S41112D Laceration without foreign body of left upper arm, subsequent encounter: Secondary | ICD-10-CM | POA: Diagnosis not present

## 2022-08-30 DIAGNOSIS — R1312 Dysphagia, oropharyngeal phase: Secondary | ICD-10-CM | POA: Diagnosis not present

## 2022-09-01 DIAGNOSIS — E46 Unspecified protein-calorie malnutrition: Secondary | ICD-10-CM | POA: Diagnosis not present

## 2022-09-01 DIAGNOSIS — E782 Mixed hyperlipidemia: Secondary | ICD-10-CM | POA: Diagnosis not present

## 2022-09-01 DIAGNOSIS — R5383 Other fatigue: Secondary | ICD-10-CM | POA: Diagnosis not present

## 2022-09-01 DIAGNOSIS — R1312 Dysphagia, oropharyngeal phase: Secondary | ICD-10-CM | POA: Diagnosis not present

## 2022-09-01 DIAGNOSIS — S41112D Laceration without foreign body of left upper arm, subsequent encounter: Secondary | ICD-10-CM | POA: Diagnosis not present

## 2022-09-01 DIAGNOSIS — J45909 Unspecified asthma, uncomplicated: Secondary | ICD-10-CM | POA: Diagnosis not present

## 2022-09-05 DIAGNOSIS — S41112D Laceration without foreign body of left upper arm, subsequent encounter: Secondary | ICD-10-CM | POA: Diagnosis not present

## 2022-09-05 DIAGNOSIS — R5383 Other fatigue: Secondary | ICD-10-CM | POA: Diagnosis not present

## 2022-09-05 DIAGNOSIS — J45909 Unspecified asthma, uncomplicated: Secondary | ICD-10-CM | POA: Diagnosis not present

## 2022-09-05 DIAGNOSIS — E46 Unspecified protein-calorie malnutrition: Secondary | ICD-10-CM | POA: Diagnosis not present

## 2022-09-05 DIAGNOSIS — R1312 Dysphagia, oropharyngeal phase: Secondary | ICD-10-CM | POA: Diagnosis not present

## 2022-09-05 DIAGNOSIS — E782 Mixed hyperlipidemia: Secondary | ICD-10-CM | POA: Diagnosis not present

## 2022-09-06 ENCOUNTER — Other Ambulatory Visit (HOSPITAL_COMMUNITY): Payer: Self-pay | Admitting: Specialist

## 2022-09-06 DIAGNOSIS — E46 Unspecified protein-calorie malnutrition: Secondary | ICD-10-CM | POA: Diagnosis not present

## 2022-09-06 DIAGNOSIS — E782 Mixed hyperlipidemia: Secondary | ICD-10-CM | POA: Diagnosis not present

## 2022-09-06 DIAGNOSIS — J45909 Unspecified asthma, uncomplicated: Secondary | ICD-10-CM | POA: Diagnosis not present

## 2022-09-06 DIAGNOSIS — R1313 Dysphagia, pharyngeal phase: Secondary | ICD-10-CM

## 2022-09-06 DIAGNOSIS — R5383 Other fatigue: Secondary | ICD-10-CM | POA: Diagnosis not present

## 2022-09-06 DIAGNOSIS — K219 Gastro-esophageal reflux disease without esophagitis: Secondary | ICD-10-CM

## 2022-09-06 DIAGNOSIS — R1312 Dysphagia, oropharyngeal phase: Secondary | ICD-10-CM

## 2022-09-06 DIAGNOSIS — S41112D Laceration without foreign body of left upper arm, subsequent encounter: Secondary | ICD-10-CM | POA: Diagnosis not present

## 2022-09-08 DIAGNOSIS — E782 Mixed hyperlipidemia: Secondary | ICD-10-CM | POA: Diagnosis not present

## 2022-09-08 DIAGNOSIS — R1312 Dysphagia, oropharyngeal phase: Secondary | ICD-10-CM | POA: Diagnosis not present

## 2022-09-08 DIAGNOSIS — S41112D Laceration without foreign body of left upper arm, subsequent encounter: Secondary | ICD-10-CM | POA: Diagnosis not present

## 2022-09-08 DIAGNOSIS — E46 Unspecified protein-calorie malnutrition: Secondary | ICD-10-CM | POA: Diagnosis not present

## 2022-09-08 DIAGNOSIS — J45909 Unspecified asthma, uncomplicated: Secondary | ICD-10-CM | POA: Diagnosis not present

## 2022-09-08 DIAGNOSIS — R5383 Other fatigue: Secondary | ICD-10-CM | POA: Diagnosis not present

## 2022-09-10 ENCOUNTER — Other Ambulatory Visit (HOSPITAL_COMMUNITY): Payer: Medicare Other

## 2022-09-10 ENCOUNTER — Ambulatory Visit (HOSPITAL_COMMUNITY): Payer: Medicare Other | Admitting: Speech Pathology

## 2022-09-10 DIAGNOSIS — R5383 Other fatigue: Secondary | ICD-10-CM | POA: Diagnosis not present

## 2022-09-10 DIAGNOSIS — E46 Unspecified protein-calorie malnutrition: Secondary | ICD-10-CM | POA: Diagnosis not present

## 2022-09-10 DIAGNOSIS — J45909 Unspecified asthma, uncomplicated: Secondary | ICD-10-CM | POA: Diagnosis not present

## 2022-09-10 DIAGNOSIS — S41112D Laceration without foreign body of left upper arm, subsequent encounter: Secondary | ICD-10-CM | POA: Diagnosis not present

## 2022-09-10 DIAGNOSIS — E782 Mixed hyperlipidemia: Secondary | ICD-10-CM | POA: Diagnosis not present

## 2022-09-10 DIAGNOSIS — R1312 Dysphagia, oropharyngeal phase: Secondary | ICD-10-CM | POA: Diagnosis not present

## 2022-09-11 DIAGNOSIS — S41112D Laceration without foreign body of left upper arm, subsequent encounter: Secondary | ICD-10-CM | POA: Diagnosis not present

## 2022-09-11 DIAGNOSIS — R5383 Other fatigue: Secondary | ICD-10-CM | POA: Diagnosis not present

## 2022-09-11 DIAGNOSIS — E782 Mixed hyperlipidemia: Secondary | ICD-10-CM | POA: Diagnosis not present

## 2022-09-11 DIAGNOSIS — R1312 Dysphagia, oropharyngeal phase: Secondary | ICD-10-CM | POA: Diagnosis not present

## 2022-09-11 DIAGNOSIS — E46 Unspecified protein-calorie malnutrition: Secondary | ICD-10-CM | POA: Diagnosis not present

## 2022-09-11 DIAGNOSIS — J45909 Unspecified asthma, uncomplicated: Secondary | ICD-10-CM | POA: Diagnosis not present

## 2022-09-12 DIAGNOSIS — J45909 Unspecified asthma, uncomplicated: Secondary | ICD-10-CM | POA: Diagnosis not present

## 2022-09-12 DIAGNOSIS — S41112D Laceration without foreign body of left upper arm, subsequent encounter: Secondary | ICD-10-CM | POA: Diagnosis not present

## 2022-09-12 DIAGNOSIS — E46 Unspecified protein-calorie malnutrition: Secondary | ICD-10-CM | POA: Diagnosis not present

## 2022-09-12 DIAGNOSIS — E782 Mixed hyperlipidemia: Secondary | ICD-10-CM | POA: Diagnosis not present

## 2022-09-12 DIAGNOSIS — R1312 Dysphagia, oropharyngeal phase: Secondary | ICD-10-CM | POA: Diagnosis not present

## 2022-09-12 DIAGNOSIS — R5383 Other fatigue: Secondary | ICD-10-CM | POA: Diagnosis not present

## 2022-09-13 DIAGNOSIS — E782 Mixed hyperlipidemia: Secondary | ICD-10-CM | POA: Diagnosis not present

## 2022-09-13 DIAGNOSIS — J45909 Unspecified asthma, uncomplicated: Secondary | ICD-10-CM | POA: Diagnosis not present

## 2022-09-13 DIAGNOSIS — R1312 Dysphagia, oropharyngeal phase: Secondary | ICD-10-CM | POA: Diagnosis not present

## 2022-09-13 DIAGNOSIS — S41112D Laceration without foreign body of left upper arm, subsequent encounter: Secondary | ICD-10-CM | POA: Diagnosis not present

## 2022-09-13 DIAGNOSIS — E46 Unspecified protein-calorie malnutrition: Secondary | ICD-10-CM | POA: Diagnosis not present

## 2022-09-13 DIAGNOSIS — R5383 Other fatigue: Secondary | ICD-10-CM | POA: Diagnosis not present

## 2022-09-17 DIAGNOSIS — E46 Unspecified protein-calorie malnutrition: Secondary | ICD-10-CM | POA: Diagnosis not present

## 2022-09-17 DIAGNOSIS — E782 Mixed hyperlipidemia: Secondary | ICD-10-CM | POA: Diagnosis not present

## 2022-09-17 DIAGNOSIS — S41112D Laceration without foreign body of left upper arm, subsequent encounter: Secondary | ICD-10-CM | POA: Diagnosis not present

## 2022-09-17 DIAGNOSIS — J45909 Unspecified asthma, uncomplicated: Secondary | ICD-10-CM | POA: Diagnosis not present

## 2022-09-17 DIAGNOSIS — R1312 Dysphagia, oropharyngeal phase: Secondary | ICD-10-CM | POA: Diagnosis not present

## 2022-09-17 DIAGNOSIS — R5383 Other fatigue: Secondary | ICD-10-CM | POA: Diagnosis not present

## 2022-09-19 DIAGNOSIS — E782 Mixed hyperlipidemia: Secondary | ICD-10-CM | POA: Diagnosis not present

## 2022-09-19 DIAGNOSIS — R1312 Dysphagia, oropharyngeal phase: Secondary | ICD-10-CM | POA: Diagnosis not present

## 2022-09-19 DIAGNOSIS — R5383 Other fatigue: Secondary | ICD-10-CM | POA: Diagnosis not present

## 2022-09-19 DIAGNOSIS — J45909 Unspecified asthma, uncomplicated: Secondary | ICD-10-CM | POA: Diagnosis not present

## 2022-09-19 DIAGNOSIS — E46 Unspecified protein-calorie malnutrition: Secondary | ICD-10-CM | POA: Diagnosis not present

## 2022-09-19 DIAGNOSIS — S41112D Laceration without foreign body of left upper arm, subsequent encounter: Secondary | ICD-10-CM | POA: Diagnosis not present

## 2022-09-20 DIAGNOSIS — E46 Unspecified protein-calorie malnutrition: Secondary | ICD-10-CM | POA: Diagnosis not present

## 2022-09-20 DIAGNOSIS — R1312 Dysphagia, oropharyngeal phase: Secondary | ICD-10-CM | POA: Diagnosis not present

## 2022-09-20 DIAGNOSIS — J45909 Unspecified asthma, uncomplicated: Secondary | ICD-10-CM | POA: Diagnosis not present

## 2022-09-20 DIAGNOSIS — E782 Mixed hyperlipidemia: Secondary | ICD-10-CM | POA: Diagnosis not present

## 2022-09-20 DIAGNOSIS — R5383 Other fatigue: Secondary | ICD-10-CM | POA: Diagnosis not present

## 2022-09-20 DIAGNOSIS — S41112D Laceration without foreign body of left upper arm, subsequent encounter: Secondary | ICD-10-CM | POA: Diagnosis not present

## 2022-09-21 DIAGNOSIS — E46 Unspecified protein-calorie malnutrition: Secondary | ICD-10-CM | POA: Diagnosis not present

## 2022-09-21 DIAGNOSIS — R1312 Dysphagia, oropharyngeal phase: Secondary | ICD-10-CM | POA: Diagnosis not present

## 2022-09-21 DIAGNOSIS — J45909 Unspecified asthma, uncomplicated: Secondary | ICD-10-CM | POA: Diagnosis not present

## 2022-09-21 DIAGNOSIS — E782 Mixed hyperlipidemia: Secondary | ICD-10-CM | POA: Diagnosis not present

## 2022-09-21 DIAGNOSIS — S41112D Laceration without foreign body of left upper arm, subsequent encounter: Secondary | ICD-10-CM | POA: Diagnosis not present

## 2022-09-21 DIAGNOSIS — R5383 Other fatigue: Secondary | ICD-10-CM | POA: Diagnosis not present

## 2022-09-24 ENCOUNTER — Encounter (HOSPITAL_COMMUNITY): Payer: Self-pay | Admitting: Speech Pathology

## 2022-09-24 ENCOUNTER — Ambulatory Visit (HOSPITAL_COMMUNITY)
Admission: RE | Admit: 2022-09-24 | Discharge: 2022-09-24 | Disposition: A | Discharge status: Home / Self Care | Payer: Medicare Other | Source: Ambulatory Visit | Attending: Internal Medicine | Admitting: Internal Medicine

## 2022-09-24 ENCOUNTER — Ambulatory Visit (HOSPITAL_COMMUNITY): Payer: Medicare Other | Attending: Internal Medicine | Admitting: Speech Pathology

## 2022-09-24 DIAGNOSIS — R1312 Dysphagia, oropharyngeal phase: Secondary | ICD-10-CM | POA: Insufficient documentation

## 2022-09-24 DIAGNOSIS — R1313 Dysphagia, pharyngeal phase: Secondary | ICD-10-CM | POA: Insufficient documentation

## 2022-09-24 DIAGNOSIS — R1310 Dysphagia, unspecified: Secondary | ICD-10-CM | POA: Diagnosis not present

## 2022-09-24 DIAGNOSIS — K219 Gastro-esophageal reflux disease without esophagitis: Secondary | ICD-10-CM | POA: Insufficient documentation

## 2022-09-25 DIAGNOSIS — E782 Mixed hyperlipidemia: Secondary | ICD-10-CM | POA: Diagnosis not present

## 2022-09-25 DIAGNOSIS — R1312 Dysphagia, oropharyngeal phase: Secondary | ICD-10-CM | POA: Diagnosis not present

## 2022-09-25 DIAGNOSIS — S41112D Laceration without foreign body of left upper arm, subsequent encounter: Secondary | ICD-10-CM | POA: Diagnosis not present

## 2022-09-25 DIAGNOSIS — R5383 Other fatigue: Secondary | ICD-10-CM | POA: Diagnosis not present

## 2022-09-25 DIAGNOSIS — E46 Unspecified protein-calorie malnutrition: Secondary | ICD-10-CM | POA: Diagnosis not present

## 2022-09-25 DIAGNOSIS — J45909 Unspecified asthma, uncomplicated: Secondary | ICD-10-CM | POA: Diagnosis not present

## 2022-09-26 DIAGNOSIS — R5383 Other fatigue: Secondary | ICD-10-CM | POA: Diagnosis not present

## 2022-09-26 DIAGNOSIS — Z9181 History of falling: Secondary | ICD-10-CM | POA: Diagnosis not present

## 2022-09-26 DIAGNOSIS — S41112D Laceration without foreign body of left upper arm, subsequent encounter: Secondary | ICD-10-CM | POA: Diagnosis not present

## 2022-09-26 DIAGNOSIS — Z8616 Personal history of COVID-19: Secondary | ICD-10-CM | POA: Diagnosis not present

## 2022-09-26 DIAGNOSIS — J45909 Unspecified asthma, uncomplicated: Secondary | ICD-10-CM | POA: Diagnosis not present

## 2022-09-26 DIAGNOSIS — W19XXXD Unspecified fall, subsequent encounter: Secondary | ICD-10-CM | POA: Diagnosis not present

## 2022-09-26 DIAGNOSIS — R1312 Dysphagia, oropharyngeal phase: Secondary | ICD-10-CM | POA: Diagnosis not present

## 2022-09-26 DIAGNOSIS — Z681 Body mass index (BMI) 19 or less, adult: Secondary | ICD-10-CM | POA: Diagnosis not present

## 2022-09-26 DIAGNOSIS — E782 Mixed hyperlipidemia: Secondary | ICD-10-CM | POA: Diagnosis not present

## 2022-09-26 DIAGNOSIS — E46 Unspecified protein-calorie malnutrition: Secondary | ICD-10-CM | POA: Diagnosis not present

## 2022-09-27 DIAGNOSIS — R5383 Other fatigue: Secondary | ICD-10-CM | POA: Diagnosis not present

## 2022-09-27 DIAGNOSIS — E782 Mixed hyperlipidemia: Secondary | ICD-10-CM | POA: Diagnosis not present

## 2022-09-27 DIAGNOSIS — S41112D Laceration without foreign body of left upper arm, subsequent encounter: Secondary | ICD-10-CM | POA: Diagnosis not present

## 2022-09-27 DIAGNOSIS — J45909 Unspecified asthma, uncomplicated: Secondary | ICD-10-CM | POA: Diagnosis not present

## 2022-09-27 DIAGNOSIS — R1312 Dysphagia, oropharyngeal phase: Secondary | ICD-10-CM | POA: Diagnosis not present

## 2022-09-27 DIAGNOSIS — E46 Unspecified protein-calorie malnutrition: Secondary | ICD-10-CM | POA: Diagnosis not present

## 2022-09-27 NOTE — Therapy (Incomplete)
[place], [place], [address] Phone: [phone]   Fax:  [phone]  Modified Barium Swallow  Patient Details  Name: Christine Leblanc MRN: [id number] Date of Birth: 1929/05/26 No data recorded  Encounter Date: 09/24/2022    09/24/22 1631  SLP Visits / Re-Eval  Visit Number 1  Number of Visits 1  Authorization  Authorization Type Medicare  SLP Time Calculation  SLP Start Time 7416  SLP Stop Time  3845  SLP Time Calculation (min) 39 min  SLP - End of Session  Activity Tolerance Patient tolerated treatment well     Past Medical History:  Diagnosis Date  . Anemia   . Asthma   . Cancer ([place])    skin Ca- ? basal cell   . Chronic back pain   . Chronic neck pain   . Degenerative joint disease    Left shoulder; cervical spine, knees & hands   . Gastroesophageal reflux disease    Hiatal hernia; distal esophageal web requiring dilatation; gastric polyps; gastritis; refuses colonoscopy  . GERD (gastroesophageal reflux disease)   . History of stress test 1990's   stress test done under the care of Dr. Lattie Haw & Dr. Gwenlyn Found, now being followed by Dr. Harl Bowie- in [place] , recently seen & told to f/U in one yr.   . Hyperlipidemia 04/27/2011  . Hypertension   . Hypothyroidism   . Lymphocytic thyroiditis   . Mild carotid artery disease ([place])   . Multiple thyroid nodules 2010   Adenomatous; thyroidectomy in 2010  . Orthostasis   . Osteoarthritis of right knee 07/27/2014  . Pneumonia    hosp. for pneumonia- long time ago   . Primary localized osteoarthrosis of right shoulder 06/21/2015  . Seizures ([place])    yes- as a child- & into adult years, states she took med. for them at one time, stopped at 32 yrs. of age   . Syncope and collapse 2007   Possible CVA in 2012 with left lower extremity weakness; refused hospitalization; CT-Atrophy and chronic microvascular ischemic change.     Past Surgical History:   Procedure Laterality Date  . ABDOMINAL HYSTERECTOMY     fibroids  . CATARACT EXTRACTION     Bilateral; redo surgery on the right for incomplete primary procedure  . COLONOSCOPY  Remote  . ESOPHAGOGASTRODUODENOSCOPY  11/2009   Dr. Oneida Alar: probable distal web s/p dilation small hiatal hernia/gastric polyps/mild gastritis, appeared to have narrowing at the junction of D1 and D2 dilated up to 12 mm  . ESOPHAGOGASTRODUODENOSCOPY N/A 03/21/2015   Surgeon: Danie Binder, MD; proximal esophageal web s/p dilation, multiple gastric polyps s/p multiple biopsies, mild erosive gastritis s/p biopsy.  Pathology with fundic gland polyps, chronic gastritis, negative for H. pylori.  . EYE SURGERY    . PROLAPSED UTERINE FIBROID LIGATION     outcomed with rectocele & cystocele  . SAVORY DILATION N/A 03/21/2015   Procedure: SAVORY DILATION;  Surgeon: Danie Binder, MD;  [facility];  Service: Endoscopy;  Laterality: N/A;  . SHOULDER SURGERY     left  . TOTAL KNEE ARTHROPLASTY Right 07/27/2014   Procedure: RIGHT TOTAL KNEE ARTHROPLASTY;  Surgeon: Johnny Bridge, MD;  Location: [facility];  Service: Orthopedics;  Laterality: Right;  . TOTAL SHOULDER ARTHROPLASTY Right 06/21/2015   Procedure: RIGHT TOTAL SHOULDER ARTHROPLASTY;  Surgeon: Marchia Bond, MD;  Location: [facility];  Service: Orthopedics;  Laterality: Right;  . TOTAL THYROIDECTOMY  2010  There were no vitals filed for this visit.  09/24/22 1616  General Information  Date of Onset 09/06/22  HPI Christine Leblanc is a 88 yo female referred for MBSS by Dr. Hurshel Keys. Pt is known to SLP from dysphagia therapy in 2018.  Type of Study MBS-Modified Barium Swallow Study  Diet Prior to this Study Regular;Thin liquids  Temperature Spikes Noted No  Respiratory Status Room air  History of Recent Intubation No  Behavior/Cognition Alert;Cooperative;Pleasant mood  Oral Cavity Assessment WFL  Oral Care Completed by SLP No  Oral Cavity - Dentition  Adequate natural dentition  Vision Functional for self feeding  Self-Feeding Abilities Able to feed self  Patient Positioning Upright in chair  Baseline Vocal Quality Wet  Volitional Cough Strong  Volitional Swallow Able to elicit  Anatomy [facility]  Pharyngeal Secretions Not observed secondary MBS    09/24/22 2255  Oral Preparation/Oral Phase  Oral Phase Impaired  Oral - Thin  Oral - Thin Teaspoon Arrhythmic lingual movement;Weak ligual manipulation;Lingual pumping;Decreased bolus cohesion;Piecemeal swallowing;Decreased velo-pharyngeal closure  Electrical stimulation - Oral Phase  Was Electrical Stimulation Used No     09/24/22 2320  Pharyngeal Phase  Pharyngeal Phase Impaired  Pharyngeal - Honey  Pharyngeal- Honey Cup Delayed swallow initiation;Swallow initiation at vallecula;Reduced pharyngeal peristalsis;Reduced epiglottic inversion;Reduced anterior laryngeal mobility;Reduced tongue base retraction;Penetration/Apiration after swallow;Pharyngeal residue - valleculae  Pharyngeal Material enters airway, passes BELOW cords and not ejected out despite cough attempt by patient  Pharyngeal - Nectar  Pharyngeal- Nectar Cup Swallow initiation at vallecula;Delayed swallow initiation;Reduced anterior laryngeal mobility;Reduced airway/laryngeal closure;Reduced epiglottic inversion;Reduced tongue base retraction;Penetration/Apiration after swallow;Trace aspiration;Pharyngeal residue - valleculae;Pharyngeal residue - pyriform  Pharyngeal Material enters airway, passes BELOW cords and not ejected out despite cough attempt by patient  Pharyngeal - Thin  Pharyngeal- Thin Teaspoon Delayed swallow initiation;Swallow initiation at vallecula;Reduced pharyngeal peristalsis;Reduced epiglottic inversion;Reduced laryngeal elevation;Reduced airway/laryngeal closure;Reduced tongue base retraction;Penetration/Aspiration during swallow;Penetration/Apiration after swallow;Trace aspiration;Pharyngeal residue -  valleculae;Pharyngeal residue - pyriform  Pharyngeal Material enters airway, passes BELOW cords and not ejected out despite cough attempt by patient  Pharyngeal - Solids  Pharyngeal- Puree Swallow initiation at vallecula;Reduced pharyngeal peristalsis;Reduced epiglottic inversion;Reduced tongue base retraction;Pharyngeal residue - valleculae  Pharyngeal- Regular Delayed swallow initiation-vallecula;Reduced pharyngeal peristalsis;Reduced epiglottic inversion;Reduced tongue base retraction;Pharyngeal residue - valleculae  Electrical Stimulation - Pharyngeal Phase  Was Electrical Stimulation Used No    09/24/22 1632  Cervical Esophageal Phase  Cervical Esophageal Phase Impaired  Cervical Esophageal Phase - Thin  Thin Cup Reduced cricopharyngeal relaxation;Prominent cricopharyngeal segment  Cervical Esophageal Phase - Comment  Cervical Esophageal Comment small Zenker's diverticulum    09/24/22 2341  Swallow Evaluation Recommendations  SLP Diet Recommendations Dysphagia 2 (chopped);Thin;Nectar  Liquid Administration via Cup  Medication Administration Crushed with puree  Supervision Patient able to self feed  Compensations Multiple dry swallows after each bite/sip;Hard cough after swallow  Postural Changes Seated upright at 90 degrees;Remain upright for at least 30 minutes after feeds/meals   Clinical Impression: Pt presents with severe oropharyngeal dysphagia characterized by weak lingual movement resulting in prolonged oral transit, piecemeal deglutition, reduced anterior posterior movement with lingual residuals; pharyngeal phase is marked by impaired movement and timing of velopharyngeal closure, reduced tongue base retraction and little no epiglottic deflection with reduced laryngeal vestibule closure resulting in significant vallecula residue and aspiration after the swallow with all liquid consistencies. Head turn to the LEFT was possibly slightly better in terms of moving more of the bolus  through the pharynx and UES than head neutral  or to the right. Pt with wet vocal quality and intermittent throat clears and some coughing throughout, but never able to remove aspirate from trachea. The barium tablet became lodged in the valleculae and was not cleared and Pt without awareness of the same (reduced sensation). This imaging was compare to her last MBSS in 2017 and found to be similar in terms of physiology, but worse (greater weakness and increased residuals and aspiration). Pt appears to have been managing her severe dysphagia for several years without getting PNA. She has lost weight and suspect it will be challenging to meet nutritional needs. Pt may wish to consider alternative means of nutrition to supplement oral nutrition, however she is unsure she wants to do this. Recommend that she meet with a dietician to help guide her in amount of calories needed per day. She would like to continue with oral diet and she was encouraged to continue practicing good oral care and improve her cough to continue with D2/soft finely chopped and thin liquids. There did not appear to be an appreciable difference in thin versus thickened liquids (increased residuals with thicker consistencies and textures). Because the barium tablet was not removed from her valleculae, she                         Patient will benefit from skilled therapeutic intervention in order to improve the following deficits and impairments:   Oropharyngeal dysphagia        Problem List Patient Active Problem List   Diagnosis Date Noted  . Protein-calorie malnutrition, mild ([place]) 06/22/2022  . Acute bronchitis 06/22/2022  . Iron deficiency anemia 06/22/2022  . COPD (chronic obstructive pulmonary disease) ([place]) 06/22/2022  . Loss of weight 10/09/2021  . Pharyngeal dysphagia 10/09/2021  . Mild carotid artery disease ([place]) 05/19/2020  . Near syncope 12/26/2018  . History of TIA (transient ischemic attack)  12/25/2018  . Neck pain   . Chest pain 09/30/2018  . Generalized weakness 09/30/2018  . Hypokalemia   . MGUS (monoclonal gammopathy of unknown significance) 10/21/2017  . Primary localized osteoarthrosis of right shoulder 06/21/2015  . S/P shoulder replacement 06/21/2015  . Dysphagia, idiopathic 11/29/2014  . Osteoarthritis of right knee 07/27/2014  . Postsurgical hypothyroidism 04/22/2014  . Hypothyroidism 10/12/2013  . Syncope and collapse   . Anemia, normocytic normochromic 04/26/2012  . Gastroesophageal reflux disease   . Essential hypertension 12/07/2011  . Leg pain, bilateral 04/27/2011  . Dyslipidemia 04/27/2011  . Other dysphagia 06/07/2010   Thank you,  Genene Churn, [place]  Teddy Spike 09/24/2022, 11:43 PM  [facility] [address] [place], [place], [address] Phone: [phone]   Fax:  [phone]  Name: YAZLIN EKBLAD MRN: [id number] Date of Birth: Dec 20, 1929

## 2022-09-27 NOTE — Therapy (Signed)
[place], [place], [address] Phone: [phone]   Fax:  [phone]  Modified Barium Swallow  Patient Details  Name: Christine Leblanc MRN: [id number] Date of Birth: 03-04-1929 No data recorded  Encounter Date: 09/24/2022    09/24/22 1631  SLP Visits / Re-Eval  Visit Number 1  Number of Visits 1  Authorization  Authorization Type Medicare  SLP Time Calculation  SLP Start Time 9485  SLP Stop Time  1224  SLP Time Calculation (min) 39 min  SLP - End of Session  Activity Tolerance Patient tolerated treatment well     Past Medical History:  Diagnosis Date   Anemia    Asthma    Cancer ([place])    skin Ca- ? basal cell    Chronic back pain    Chronic neck pain    Degenerative joint disease    Left shoulder; cervical spine, knees & hands    Gastroesophageal reflux disease    Hiatal hernia; distal esophageal web requiring dilatation; gastric polyps; gastritis; refuses colonoscopy   GERD (gastroesophageal reflux disease)    History of stress test 1990's   stress test done under the care of Dr. Lattie Haw & Dr. Gwenlyn Found, now being followed by Dr. Harl Bowie- in [place] , recently seen & told to f/U in one yr.    Hyperlipidemia 04/27/2011   Hypertension    Hypothyroidism    Lymphocytic thyroiditis    Mild carotid artery disease ([place])    Multiple thyroid nodules 2010   Adenomatous; thyroidectomy in 2010   Orthostasis    Osteoarthritis of right knee 07/27/2014   Pneumonia    hosp. for pneumonia- long time ago    Primary localized osteoarthrosis of right shoulder 06/21/2015   Seizures ([place])    yes- as a child- & into adult years, states she took med. for them at one time, stopped at 32 yrs. of age    Syncope and collapse 2007   Possible CVA in 2012 with left lower extremity weakness; refused hospitalization; CT-Atrophy and chronic microvascular ischemic change.     Past Surgical History:  Procedure Laterality Date    ABDOMINAL HYSTERECTOMY     fibroids   CATARACT EXTRACTION     Bilateral; redo surgery on the right for incomplete primary procedure   COLONOSCOPY  Remote   ESOPHAGOGASTRODUODENOSCOPY  11/2009   Dr. Oneida Alar: probable distal web s/p dilation small hiatal hernia/gastric polyps/mild gastritis, appeared to have narrowing at the junction of D1 and D2 dilated up to 12 mm   ESOPHAGOGASTRODUODENOSCOPY N/A 03/21/2015   Surgeon: Danie Binder, MD; proximal esophageal web s/p dilation, multiple gastric polyps s/p multiple biopsies, mild erosive gastritis s/p biopsy.  Pathology with fundic gland polyps, chronic gastritis, negative for H. pylori.   EYE SURGERY     PROLAPSED UTERINE FIBROID LIGATION     outcomed with rectocele & cystocele   SAVORY DILATION N/A 03/21/2015   Procedure: SAVORY DILATION;  Surgeon: Danie Binder, MD;  [facility];  Service: Endoscopy;  Laterality: N/A;   SHOULDER SURGERY     left   TOTAL KNEE ARTHROPLASTY Right 07/27/2014   Procedure: RIGHT TOTAL KNEE ARTHROPLASTY;  Surgeon: Johnny Bridge, MD;  Location: [facility];  Service: Orthopedics;  Laterality: Right;   TOTAL SHOULDER ARTHROPLASTY Right 06/21/2015   Procedure: RIGHT TOTAL SHOULDER ARTHROPLASTY;  Surgeon: Marchia Bond, MD;  Location: [facility];  Service: Orthopedics;  Laterality: Right;   TOTAL THYROIDECTOMY  2010  There were no vitals filed for this visit.  09/24/22 1616  General Information  Date of Onset 09/06/22  HPI Christine Leblanc is a 88 yo female referred for MBSS by Dr. Hurshel Keys. Pt is known to SLP from dysphagia therapy in 2018.  Type of Study MBS-Modified Barium Swallow Study  Diet Prior to this Study Regular;Thin liquids  Temperature Spikes Noted No  Respiratory Status Room air  History of Recent Intubation No  Behavior/Cognition Alert;Cooperative;Pleasant mood  Oral Cavity Assessment WFL  Oral Care Completed by SLP No  Oral Cavity - Dentition Adequate natural dentition  Vision  Functional for self feeding  Self-Feeding Abilities Able to feed self  Patient Positioning Upright in chair  Baseline Vocal Quality Wet  Volitional Cough Strong  Volitional Swallow Able to elicit  Anatomy [facility]  Pharyngeal Secretions Not observed secondary MBS    09/24/22 2255  Oral Preparation/Oral Phase  Oral Phase Impaired  Oral - Thin  Oral - Thin Teaspoon Arrhythmic lingual movement;Weak ligual manipulation;Lingual pumping;Decreased bolus cohesion;Piecemeal swallowing;Decreased velo-pharyngeal closure  Electrical stimulation - Oral Phase  Was Electrical Stimulation Used No     09/24/22 2320  Pharyngeal Phase  Pharyngeal Phase Impaired  Pharyngeal - Honey  Pharyngeal- Honey Cup Delayed swallow initiation;Swallow initiation at vallecula;Reduced pharyngeal peristalsis;Reduced epiglottic inversion;Reduced anterior laryngeal mobility;Reduced tongue base retraction;Penetration/Apiration after swallow;Pharyngeal residue - valleculae  Pharyngeal Material enters airway, passes BELOW cords and not ejected out despite cough attempt by patient  Pharyngeal - Nectar  Pharyngeal- Nectar Cup Swallow initiation at vallecula;Delayed swallow initiation;Reduced anterior laryngeal mobility;Reduced airway/laryngeal closure;Reduced epiglottic inversion;Reduced tongue base retraction;Penetration/Apiration after swallow;Trace aspiration;Pharyngeal residue - valleculae;Pharyngeal residue - pyriform  Pharyngeal Material enters airway, passes BELOW cords and not ejected out despite cough attempt by patient  Pharyngeal - Thin  Pharyngeal- Thin Teaspoon Delayed swallow initiation;Swallow initiation at vallecula;Reduced pharyngeal peristalsis;Reduced epiglottic inversion;Reduced laryngeal elevation;Reduced airway/laryngeal closure;Reduced tongue base retraction;Penetration/Aspiration during swallow;Penetration/Apiration after swallow;Trace aspiration;Pharyngeal residue - valleculae;Pharyngeal residue - pyriform   Pharyngeal Material enters airway, passes BELOW cords and not ejected out despite cough attempt by patient  Pharyngeal - Solids  Pharyngeal- Puree Swallow initiation at vallecula;Reduced pharyngeal peristalsis;Reduced epiglottic inversion;Reduced tongue base retraction;Pharyngeal residue - valleculae  Pharyngeal- Regular Delayed swallow initiation-vallecula;Reduced pharyngeal peristalsis;Reduced epiglottic inversion;Reduced tongue base retraction;Pharyngeal residue - valleculae  Electrical Stimulation - Pharyngeal Phase  Was Electrical Stimulation Used No    09/24/22 1632  Cervical Esophageal Phase  Cervical Esophageal Phase Impaired  Cervical Esophageal Phase - Thin  Thin Cup Reduced cricopharyngeal relaxation;Prominent cricopharyngeal segment  Cervical Esophageal Phase - Comment  Cervical Esophageal Comment small Zenker's diverticulum    09/24/22 2341  Swallow Evaluation Recommendations  SLP Diet Recommendations Dysphagia 2 (chopped);Thin;Nectar  Liquid Administration via Cup  Medication Administration Crushed with puree  Supervision Patient able to self feed  Compensations Multiple dry swallows after each bite/sip;Hard cough after swallow  Postural Changes Seated upright at 90 degrees;Remain upright for at least 30 minutes after feeds/meals   Clinical Impression: Pt presents with severe oropharyngeal dysphagia characterized by weak lingual movement resulting in prolonged oral transit, piecemeal deglutition, reduced anterior posterior movement with lingual residuals; pharyngeal phase is marked by impaired movement and timing of velopharyngeal closure, reduced tongue base retraction and little no epiglottic deflection with reduced laryngeal vestibule closure resulting in significant vallecula residue and aspiration after the swallow with all liquid consistencies. Head turn to the LEFT was possibly slightly better in terms of moving more of the bolus through the pharynx and UES than head  neutral  or to the right. Pt with wet vocal quality and intermittent throat clears and some coughing throughout, but never able to remove aspirate from trachea. The barium tablet became lodged in the valleculae and was not cleared and Pt without awareness of the same (reduced sensation). This imaging was compare to her last MBSS in 2017 and found to be similar in terms of physiology, but worse (greater weakness and increased residuals and aspiration). Pt appears to have been managing her severe dysphagia for several years without getting PNA. She has lost weight and suspect it will be challenging to meet nutritional needs. Pt may wish to consider alternative means of nutrition to supplement oral nutrition, however she is unsure she wants to do this. Recommend that she meet with a dietician to help guide her in amount of calories needed per day. She would like to continue with oral diet and she was encouraged to continue practicing good oral care and improve her cough to continue with D2/soft finely chopped and thin liquids. There did not appear to be an appreciable difference in thin versus thickened liquids (increased residuals with thicker consistencies and textures). Because the barium tablet was not removed from her valleculae, she should try to crush medications as able and take in puree/yogurt and follow with liquid wash. SLP spoke with Pt and son after the study and reviewed imaging, as well as spoke with treating SLP on the telephone.     Patient will benefit from skilled therapeutic intervention in order to improve the following deficits and impairments:   Oropharyngeal dysphagia        Problem List Patient Active Problem List   Diagnosis Date Noted   Protein-calorie malnutrition, mild ([place]) 06/22/2022   Acute bronchitis 06/22/2022   Iron deficiency anemia 06/22/2022   COPD (chronic obstructive pulmonary disease) ([place]) 06/22/2022   Loss of weight 10/09/2021   Pharyngeal dysphagia  10/09/2021   Mild carotid artery disease ([place]) 05/19/2020   Near syncope 12/26/2018   History of TIA (transient ischemic attack) 12/25/2018   Neck pain    Chest pain 09/30/2018   Generalized weakness 09/30/2018   Hypokalemia    MGUS (monoclonal gammopathy of unknown significance) 10/21/2017   Primary localized osteoarthrosis of right shoulder 06/21/2015   S/P shoulder replacement 06/21/2015   Dysphagia, idiopathic 11/29/2014   Osteoarthritis of right knee 07/27/2014   Postsurgical hypothyroidism 04/22/2014   Hypothyroidism 10/12/2013   Syncope and collapse    Anemia, normocytic normochromic 04/26/2012   Gastroesophageal reflux disease    Essential hypertension 12/07/2011   Leg pain, bilateral 04/27/2011   Dyslipidemia 04/27/2011   Other dysphagia 06/07/2010   Thank you,  Genene Churn, CCC-SLP [phone]  Genene Churn, [place] 09/24/2022, 11:43 PM  [facility] [address] [place], [place], [address] Phone: [phone]   Fax:  [phone]  Name: Christine Leblanc MRN: [id number] Date of Birth: 1929-12-30

## 2022-09-30 ENCOUNTER — Encounter: Payer: Self-pay | Admitting: Emergency Medicine

## 2022-09-30 ENCOUNTER — Ambulatory Visit
Admission: EM | Admit: 2022-09-30 | Discharge: 2022-09-30 | Disposition: A | Discharge status: Home / Self Care | Payer: Medicare Other | Attending: Emergency Medicine | Admitting: Emergency Medicine

## 2022-09-30 DIAGNOSIS — S50812A Abrasion of left forearm, initial encounter: Secondary | ICD-10-CM | POA: Diagnosis not present

## 2022-09-30 DIAGNOSIS — L089 Local infection of the skin and subcutaneous tissue, unspecified: Secondary | ICD-10-CM | POA: Diagnosis not present

## 2022-09-30 DIAGNOSIS — S40812A Abrasion of left upper arm, initial encounter: Secondary | ICD-10-CM | POA: Diagnosis not present

## 2022-09-30 MED ORDER — DOXYCYCLINE HYCLATE 100 MG PO CAPS: 100.0000 mg | ORAL_CAPSULE | Freq: Two times a day (BID) | ORAL | 0 refills | Status: AC

## 2022-09-30 NOTE — Discharge Instructions (Addendum)
Leave this dressing on until you are seen by Dr. [place] Crane.  You may need to follow-up with the wound care center in [place].  Discussed this with him.  Finish the doxycycline, even if you feel better.

## 2022-09-30 NOTE — ED Provider Notes (Signed)
HPI  SUBJECTIVE:  Christine Leblanc is a 88 y.o. female who presents with bleeding skin tears/abrasions sustained 1 month ago after having a fall.  She states that home health has been coming out and dressing it once a week.  She states that she had a tight mesh dressing put on this past week, which caused her pain, so her son removed the dressing last night, and then the wounds started bleeding.  She reports some surrounding erythema on the distal wound.  No pus, odor, fevers, arm swelling.  No aggravating or alleviating factors.  She has not tried anything for this.  She is on aspirin 81 mg daily, has a history of hypertension, CVA.  PCP: Celene Squibb, MD-has follow-up with him on Tuesday.    Past Medical History:  Diagnosis Date   Anemia    Asthma    Cancer ([place])    skin Ca- ? basal cell    Chronic back pain    Chronic neck pain    Degenerative joint disease    Left shoulder; cervical spine, knees & hands    Gastroesophageal reflux disease    Hiatal hernia; distal esophageal web requiring dilatation; gastric polyps; gastritis; refuses colonoscopy   GERD (gastroesophageal reflux disease)    History of stress test 1990's   stress test done under the care of Dr. Lattie Haw & Dr. Gwenlyn Found, now being followed by Dr. Harl Bowie- in [place] , recently seen & told to f/U in one yr.    Hyperlipidemia 04/27/2011   Hypertension    Hypothyroidism    Lymphocytic thyroiditis    Mild carotid artery disease ([place])    Multiple thyroid nodules 2010   Adenomatous; thyroidectomy in 2010   Orthostasis    Osteoarthritis of right knee 07/27/2014   Pneumonia    hosp. for pneumonia- long time ago    Primary localized osteoarthrosis of right shoulder 06/21/2015   Seizures ([employer])    yes- as a child- & into adult years, states she took med. for them at one time, stopped at 32 yrs. of age    Syncope and collapse 2007   Possible CVA in 2012 with left lower extremity weakness; refused hospitalization; CT-Atrophy and  chronic microvascular ischemic change.     Past Surgical History:  Procedure Laterality Date   ABDOMINAL HYSTERECTOMY     fibroids   CATARACT EXTRACTION     Bilateral; redo surgery on the right for incomplete primary procedure   COLONOSCOPY  Remote   ESOPHAGOGASTRODUODENOSCOPY  11/2009   Dr. Oneida Alar: probable distal web s/p dilation small hiatal hernia/gastric polyps/mild gastritis, appeared to have narrowing at the junction of D1 and D2 dilated up to 12 mm   ESOPHAGOGASTRODUODENOSCOPY N/A 03/21/2015   Surgeon: Danie Binder, MD; proximal esophageal web s/p dilation, multiple gastric polyps s/p multiple biopsies, mild erosive gastritis s/p biopsy.  Pathology with fundic gland polyps, chronic gastritis, negative for H. pylori.   EYE SURGERY     PROLAPSED UTERINE FIBROID LIGATION     outcomed with rectocele & cystocele   SAVORY DILATION N/A 03/21/2015   Procedure: SAVORY DILATION;  Surgeon: Danie Binder, MD;  [facility];  Service: Endoscopy;  Laterality: N/A;   SHOULDER SURGERY     left   TOTAL KNEE ARTHROPLASTY Right 07/27/2014   Procedure: RIGHT TOTAL KNEE ARTHROPLASTY;  Surgeon: Johnny Bridge, MD;  Location: [place];  Service: Orthopedics;  Laterality: Right;   TOTAL SHOULDER ARTHROPLASTY Right 06/21/2015   Procedure: RIGHT  TOTAL SHOULDER ARTHROPLASTY;  Surgeon: Marchia Bond, MD;  Location: [place];  Service: Orthopedics;  Laterality: Right;   TOTAL THYROIDECTOMY  2010    Family History  Problem Relation Age of Onset   Heart disease Mother    Gallbladder disease Mother    Heart failure Mother    Diabetes Mother    Breast cancer Sister        98 years ago   Diabetes Sister    Heart attack Father    Diabetes Maternal Grandmother    Colon cancer Neg Hx    Stroke Neg Hx     Social History   Tobacco Use   Smoking status: Former    Packs/day: 0.80    Years: 20.00    Total pack years: 16.00    Types: Cigarettes    Quit date: 12/31/1978    Years since  quitting: 43.7   Smokeless tobacco: Never  Vaping Use   Vaping Use: Never used  Substance Use Topics   Alcohol use: Yes    Comment: rarely   Drug use: No    No current facility-administered medications for this encounter.  Current Outpatient Medications:    doxycycline (VIBRAMYCIN) 100 MG capsule, Take 1 capsule (100 mg total) by mouth 2 (two) times daily for 5 days., Disp: 10 capsule, Rfl: 0   albuterol (VENTOLIN HFA) 108 (90 Base) MCG/ACT inhaler, Inhale 1-2 puffs into the lungs every 6 (six) hours as needed for wheezing or shortness of breath. Wheezing, Asthma Symptoms, Disp: 8 g, Rfl: 0   aspirin EC 81 MG tablet, Take 81 mg by mouth daily. Swallow whole., Disp: , Rfl:    Carbinoxamine Maleate 4 MG TABS, Take 1 tablet (4 mg total) by mouth every 8 (eight) hours. (Patient taking differently: Take 1 tablet by mouth at bedtime.), Disp: 270 tablet, Rfl: 2   EPINEPHrine 0.3 mg/0.3 mL IJ SOAJ injection, Inject into the muscle as needed for anaphylaxis., Disp: , Rfl:    ferrous sulfate 325 (65 FE) MG tablet, Take 325 mg by mouth daily with breakfast., Disp: , Rfl:    Glucosamine-Chondroit-Vit C-Mn (GLUCOSAMINE 1500 COMPLEX PO), Take by mouth. Takes '1500mg'$  with Chonroitin '1200mg'$  once daily arthritis pain relief- 8 hr caplet once as needed., Disp: , Rfl:    ipratropium (ATROVENT) 0.06 % nasal spray, USE 2 SPRAYS IN EACH NOSTRIL THREE TIMES A DAY, Disp: 45 mL, Rfl: 1   levothyroxine (SYNTHROID) 88 MCG tablet, TAKE 1 TABLET DAILY ON MONDAY THROUGH SATURDAY AND ONE-HALF (1/2) TABLET ON SUNDAY, Disp: 90 tablet, Rfl: 3   omeprazole (PRILOSEC) 20 MG capsule, Take 1 capsule (20 mg total) by mouth daily., Disp: 90 capsule, Rfl: 3   pravastatin (PRAVACHOL) 40 MG tablet, TAKE 2 TABLETS DAILY, Disp: 180 tablet, Rfl: 3   promethazine-dextromethorphan (PROMETHAZINE-DM) 6.25-15 MG/5ML syrup, Take 5 mLs by mouth at bedtime as needed for cough., Disp: 100 mL, Rfl: 0   senna (SENOKOT) 8.6 MG tablet, Take 2 tablets  by mouth at bedtime. , Disp: , Rfl:    TRELEGY ELLIPTA 100-62.5-25 MCG/ACT AEPB, Inhale 1 puff into the lungs daily. (Patient not taking: Reported on 07/04/2022), Disp: 84 each, Rfl: 1   vitamin B-12 (CYANOCOBALAMIN) 1000 MCG tablet, Take 1,000 mcg by mouth every morning., Disp: , Rfl:    vitamin C (ASCORBIC ACID) 500 MG tablet, Take 500 mg by mouth daily., Disp: , Rfl:   Allergies  Allergen Reactions   Celecoxib Shortness Of Breath   Dexilant [Dexlansoprazole] Anaphylaxis  abd pain   Famotidine     Makes her feel "very bad"     ROS  As noted in HPI.   Physical Exam  BP (!) 141/83 (BP Location: Right Arm)   Pulse 96   Temp 98.3 F (36.8 C) (Oral)   Resp 16   SpO2 95%   Constitutional: Well developed, well nourished, no acute distress Eyes:  EOMI, conjunctiva normal bilaterally HENT: Normocephalic, atraumatic,mucus membranes moist Respiratory: Normal inspiratory effort Cardiovascular: Normal rate GI: nondistended skin:  12 x 6 cm abrasion left upper arm, with good granulation tissue.   6 x 5 cm abrasion left forearm with good granulation tissue.  Some surrounding tender erythema.    Musculoskeletal: RP 2+. Neurologic: Alert & oriented x 3, no focal neuro deficits Psychiatric: Speech and behavior appropriate   ED Course   Medications - No data to display  Orders Placed This Encounter  Procedures   Apply dressing    Xeroform, nonstick dressing with Ace wrap    Standing Status:   Standing    Number of Occurrences:   1    No results found for this or any previous visit (from the past 24 hour(s)). No results found.  ED Clinical Impression  1. Infected abrasion of forearm, left, initial encounter   2. Abrasion of left upper arm, initial encounter      ED Assessment/Plan     Cleaned wounds with wound cleanser solution, gently patted dry.  There is no purulent drainage.  Applied Xeroform, nonstick absorbent dressing and an Ace wrap on top of these  wounds.  Will send home with doxycycline for 5 days to cover any possible infection that she may be developing.  She is neurovascularly intact.  Advised follow-up with the wound care center in [place].  She has follow-up with her primary care provider in 2 days, and will discuss this with him.  Discussed MDM, treatment plan, and plan for follow-up with patient. patient agrees with plan.   Meds ordered this encounter  Medications   doxycycline (VIBRAMYCIN) 100 MG capsule    Sig: Take 1 capsule (100 mg total) by mouth 2 (two) times daily for 5 days.    Dispense:  10 capsule    Refill:  0      *This clinic note was created using [employer]. Therefore, there may be occasional mistakes despite careful proofreading.  ?    Melynda Ripple, MD 10/01/22 0700

## 2022-09-30 NOTE — ED Triage Notes (Signed)
Wound to left arm.  States son took off sleeve that was around wound on arm last night and arm started to bleed

## 2022-10-01 DIAGNOSIS — E782 Mixed hyperlipidemia: Secondary | ICD-10-CM | POA: Diagnosis not present

## 2022-10-01 DIAGNOSIS — J45909 Unspecified asthma, uncomplicated: Secondary | ICD-10-CM | POA: Diagnosis not present

## 2022-10-01 DIAGNOSIS — E46 Unspecified protein-calorie malnutrition: Secondary | ICD-10-CM | POA: Diagnosis not present

## 2022-10-01 DIAGNOSIS — R1312 Dysphagia, oropharyngeal phase: Secondary | ICD-10-CM | POA: Diagnosis not present

## 2022-10-01 DIAGNOSIS — R5383 Other fatigue: Secondary | ICD-10-CM | POA: Diagnosis not present

## 2022-10-01 DIAGNOSIS — S41112D Laceration without foreign body of left upper arm, subsequent encounter: Secondary | ICD-10-CM | POA: Diagnosis not present

## 2022-10-02 DIAGNOSIS — T148XXD Other injury of unspecified body region, subsequent encounter: Secondary | ICD-10-CM | POA: Diagnosis not present

## 2022-10-02 DIAGNOSIS — E43 Unspecified severe protein-calorie malnutrition: Secondary | ICD-10-CM | POA: Diagnosis not present

## 2022-10-02 DIAGNOSIS — R1310 Dysphagia, unspecified: Secondary | ICD-10-CM | POA: Diagnosis not present

## 2022-10-04 DIAGNOSIS — S41112D Laceration without foreign body of left upper arm, subsequent encounter: Secondary | ICD-10-CM | POA: Diagnosis not present

## 2022-10-04 DIAGNOSIS — R1312 Dysphagia, oropharyngeal phase: Secondary | ICD-10-CM | POA: Diagnosis not present

## 2022-10-04 DIAGNOSIS — E46 Unspecified protein-calorie malnutrition: Secondary | ICD-10-CM | POA: Diagnosis not present

## 2022-10-04 DIAGNOSIS — E782 Mixed hyperlipidemia: Secondary | ICD-10-CM | POA: Diagnosis not present

## 2022-10-04 DIAGNOSIS — R5383 Other fatigue: Secondary | ICD-10-CM | POA: Diagnosis not present

## 2022-10-04 DIAGNOSIS — J45909 Unspecified asthma, uncomplicated: Secondary | ICD-10-CM | POA: Diagnosis not present

## 2022-10-05 ENCOUNTER — Ambulatory Visit (HOSPITAL_COMMUNITY)
Admission: RE | Admit: 2022-10-05 | Discharge: 2022-10-05 | Disposition: A | Discharge status: Home / Self Care | Payer: Medicare Other | Source: Ambulatory Visit | Attending: Internal Medicine | Admitting: Internal Medicine

## 2022-10-05 ENCOUNTER — Other Ambulatory Visit (HOSPITAL_COMMUNITY): Payer: Self-pay | Admitting: Internal Medicine

## 2022-10-05 DIAGNOSIS — R0781 Pleurodynia: Secondary | ICD-10-CM

## 2022-10-05 DIAGNOSIS — Z9189 Other specified personal risk factors, not elsewhere classified: Secondary | ICD-10-CM | POA: Diagnosis not present

## 2022-10-05 DIAGNOSIS — R1310 Dysphagia, unspecified: Secondary | ICD-10-CM | POA: Diagnosis not present

## 2022-10-05 DIAGNOSIS — E43 Unspecified severe protein-calorie malnutrition: Secondary | ICD-10-CM | POA: Diagnosis not present

## 2022-10-08 DIAGNOSIS — E46 Unspecified protein-calorie malnutrition: Secondary | ICD-10-CM | POA: Diagnosis not present

## 2022-10-08 DIAGNOSIS — R5383 Other fatigue: Secondary | ICD-10-CM | POA: Diagnosis not present

## 2022-10-08 DIAGNOSIS — E43 Unspecified severe protein-calorie malnutrition: Secondary | ICD-10-CM | POA: Diagnosis not present

## 2022-10-08 DIAGNOSIS — R1312 Dysphagia, oropharyngeal phase: Secondary | ICD-10-CM | POA: Diagnosis not present

## 2022-10-08 DIAGNOSIS — Z9189 Other specified personal risk factors, not elsewhere classified: Secondary | ICD-10-CM | POA: Diagnosis not present

## 2022-10-08 DIAGNOSIS — R1310 Dysphagia, unspecified: Secondary | ICD-10-CM | POA: Diagnosis not present

## 2022-10-08 DIAGNOSIS — S41112D Laceration without foreign body of left upper arm, subsequent encounter: Secondary | ICD-10-CM | POA: Diagnosis not present

## 2022-10-08 DIAGNOSIS — T148XXD Other injury of unspecified body region, subsequent encounter: Secondary | ICD-10-CM | POA: Diagnosis not present

## 2022-10-08 DIAGNOSIS — R0781 Pleurodynia: Secondary | ICD-10-CM | POA: Diagnosis not present

## 2022-10-08 DIAGNOSIS — E782 Mixed hyperlipidemia: Secondary | ICD-10-CM | POA: Diagnosis not present

## 2022-10-08 DIAGNOSIS — J45909 Unspecified asthma, uncomplicated: Secondary | ICD-10-CM | POA: Diagnosis not present

## 2022-10-08 NOTE — Progress Notes (Unsigned)
Referring Provider: No ref. provider found Primary Care Physician:  Celene Squibb, MD Primary GI Physician: Dr. Abbey Chatters  No chief complaint on file.   HPI:   Christine Leblanc is a 88 y.o. female presenting today for follow-up. She has history of GERD, dysphagia, weight loss, constipation.   Modified barium swallow in 2017 with speech therapy revealed mild oral phase and moderate/severe pharyngeal phase dysphagia with both sensory and motor deficits.   EGD March 2016: Proximal esophageal web s/p dilation, multiple gastric polyps s/p multiple biopsies, mild erosive gastritis s/p biopsy.  Pathology with fundic gland polyps, chronic gastritis, negative for H. pylori.  Last seen in our office 04/26/2022.  GERD is well controlled on omeprazole 20 mg daily, but patient was requesting to trial off medication.  Chronic dysphagia doing okay with cutting her food up into small pieces and eating slowly.  Constipation controlled with senna daily.  Occasional dark stool on iron.  She continued to lose weight, down to 102 pounds.  Reported she had COVID in January and had difficulty time putting weight back on.  Reported she did get under 100 pounds at one point so she was headed back in the right direction.  She is drinking 1-2 protein shakes daily.  Advised that she could take omeprazole as needed, continue to cut up food into small pieces, refer to dietitian for strategies on food that she can eat that are both high in fat and protein, follow-up in 3 to 4 months.  Referral to dietitian was canceled.  Note on referral states messages were left for patient to call and schedule x3.  Patient canceled follow-up in August.  Patient called 08/13/2022 requesting referral to speech pathologist.  Modified barium swallow 09/24/2022:  Severe oropharyngeal dysphagia characterized by weak lingual movement resulting in prolonged oral transit, piecemeal deglutition, reduced anterior posterior movement with lingual  residuals; pharyngeal phase is marked by impaired movement and timing of velopharyngeal closure, reduced tongue base retraction and little no epiglottic deflection with reduced laryngeal vestibule closure resulting in significant vallecula residue and aspiration after the swallow with all liquid consistencies. Head turn to the LEFT was possibly slightly better in terms of moving more of the bolus through the pharynx and UES than head neutral or to the right. Pt with wet vocal quality and intermittent throat clears and some coughing throughout, but never able to remove aspirate from trachea. The barium tablet became lodged in the valleculae and was not cleared and Pt without awareness of the same (reduced sensation). This imaging was compare to her last MBSS in 2017 and found to be similar in terms of physiology, but worse (greater weakness and increased residuals and aspiration).   Pt may wish to consider alternative means of nutrition to supplement oral nutrition, however she is unsure she wants to do this. Recommend that she meet with a dietician to help guide her in amount of calories needed per day. She would like to continue with oral diet and she was encouraged to continue practicing good oral care and improve her cough to continue with D2/soft finely chopped and thin liquids. There did not appear to be an appreciable difference in thin versus thickened liquids (increased residuals with thicker consistencies and textures). Because the barium tablet was not removed from her valleculae, she should try to crush medications as able and take in puree/yogurt and follow with liquid wash.   Today:  ?dietician consult:   Past Medical History:  Diagnosis Date  Anemia    Asthma    Cancer ([place])    skin Ca- ? basal cell    Chronic back pain    Chronic neck pain    Degenerative joint disease    Left shoulder; cervical spine, knees & hands    Gastroesophageal reflux disease    Hiatal hernia; distal  esophageal web requiring dilatation; gastric polyps; gastritis; refuses colonoscopy   GERD (gastroesophageal reflux disease)    History of stress test 1990's   stress test done under the care of Dr. Lattie Haw & Dr. Gwenlyn Found, now being followed by Dr. Harl Bowie- in [place] , recently seen & told to f/U in one yr.    Hyperlipidemia 04/27/2011   Hypertension    Hypothyroidism    Lymphocytic thyroiditis    Mild carotid artery disease ([place])    Multiple thyroid nodules 2010   Adenomatous; thyroidectomy in 2010   Orthostasis    Osteoarthritis of right knee 07/27/2014   Pneumonia    hosp. for pneumonia- long time ago    Primary localized osteoarthrosis of right shoulder 06/21/2015   Seizures ([place])    yes- as a child- & into adult years, states she took med. for them at one time, stopped at 32 yrs. of age    Syncope and collapse 2007   Possible CVA in 2012 with left lower extremity weakness; refused hospitalization; CT-Atrophy and chronic microvascular ischemic change.     Past Surgical History:  Procedure Laterality Date   ABDOMINAL HYSTERECTOMY     fibroids   CATARACT EXTRACTION     Bilateral; redo surgery on the right for incomplete primary procedure   COLONOSCOPY  Remote   ESOPHAGOGASTRODUODENOSCOPY  11/2009   Dr. Oneida Alar: probable distal web s/p dilation small hiatal hernia/gastric polyps/mild gastritis, appeared to have narrowing at the junction of D1 and D2 dilated up to 12 mm   ESOPHAGOGASTRODUODENOSCOPY N/A 03/21/2015   Surgeon: Danie Binder, MD; proximal esophageal web s/p dilation, multiple gastric polyps s/p multiple biopsies, mild erosive gastritis s/p biopsy.  Pathology with fundic gland polyps, chronic gastritis, negative for H. pylori.   EYE SURGERY     PROLAPSED UTERINE FIBROID LIGATION     outcomed with rectocele & cystocele   SAVORY DILATION N/A 03/21/2015   Procedure: SAVORY DILATION;  Surgeon: Danie Binder, MD;  [facility];  Service: Endoscopy;  Laterality:  N/A;   SHOULDER SURGERY     left   TOTAL KNEE ARTHROPLASTY Right 07/27/2014   Procedure: RIGHT TOTAL KNEE ARTHROPLASTY;  Surgeon: Johnny Bridge, MD;  Location: [place];  Service: Orthopedics;  Laterality: Right;   TOTAL SHOULDER ARTHROPLASTY Right 06/21/2015   Procedure: RIGHT TOTAL SHOULDER ARTHROPLASTY;  Surgeon: Marchia Bond, MD;  Location: [facility];  Service: Orthopedics;  Laterality: Right;   TOTAL THYROIDECTOMY  2010    Current Outpatient Medications  Medication Sig Dispense Refill   albuterol (VENTOLIN HFA) 108 (90 Base) MCG/ACT inhaler Inhale 1-2 puffs into the lungs every 6 (six) hours as needed for wheezing or shortness of breath. Wheezing, Asthma Symptoms 8 g 0   aspirin EC 81 MG tablet Take 81 mg by mouth daily. Swallow whole.     Carbinoxamine Maleate 4 MG TABS Take 1 tablet (4 mg total) by mouth every 8 (eight) hours. (Patient taking differently: Take 1 tablet by mouth at bedtime.) 270 tablet 2   EPINEPHrine 0.3 mg/0.3 mL IJ SOAJ injection Inject into the muscle as needed for anaphylaxis.     ferrous sulfate  325 (65 FE) MG tablet Take 325 mg by mouth daily with breakfast.     Glucosamine-Chondroit-Vit C-Mn (GLUCOSAMINE 1500 COMPLEX PO) Take by mouth. Takes '1500mg'$  with Chonroitin '1200mg'$  once daily arthritis pain relief- 8 hr caplet once as needed.     ipratropium (ATROVENT) 0.06 % nasal spray USE 2 SPRAYS IN EACH NOSTRIL THREE TIMES A DAY 45 mL 1   levothyroxine (SYNTHROID) 88 MCG tablet TAKE 1 TABLET DAILY ON MONDAY THROUGH SATURDAY AND ONE-HALF (1/2) TABLET ON 'SUNDAY 90 tablet 3   omeprazole (PRILOSEC) 20 MG capsule Take 1 capsule (20 mg total) by mouth daily. 90 capsule 3   pravastatin (PRAVACHOL) 40 MG tablet TAKE 2 TABLETS DAILY 180 tablet 3   promethazine-dextromethorphan (PROMETHAZINE-DM) 6.25-15 MG/5ML syrup Take 5 mLs by mouth at bedtime as needed for cough. 100 mL 0   senna (SENOKOT) 8.6 MG tablet Take 2 tablets by mouth at bedtime.      TRELEGY ELLIPTA 100-62.5-25  MCG/ACT AEPB Inhale 1 puff into the lungs daily. (Patient not taking: Reported on 07/04/2022) 84 each 1   vitamin B-12 (CYANOCOBALAMIN) 1000 MCG tablet Take 1,000 mcg by mouth every morning.     vitamin C (ASCORBIC ACID) 500 MG tablet Take 500 mg by mouth daily.     No current facility-administered medications for this visit.    Allergies as of 10/10/2022 - Review Complete 09/30/2022  Allergen Reaction Noted   Celecoxib Shortness Of Breath    Dexilant [dexlansoprazole] Anaphylaxis 05/04/2016   Famotidine  11/04/2018    Family History  Problem Relation Age of Onset   Heart disease Mother    Gallbladder disease Mother    Heart failure Mother    Diabetes Mother    Breast cancer Sister        30'$  years ago   Diabetes Sister    Heart attack Father    Diabetes Maternal Grandmother    Colon cancer Neg Hx    Stroke Neg Hx     Social History   Socioeconomic History   Marital status: Widowed    Spouse name: Not on file   Number of children: 4   Years of education: 26   Highest education level: Not on file  Occupational History   Occupation: Artist    Comment: does not yield regular income   Occupation: Retired    Comment: Marine scientist  Tobacco Use   Smoking status: Former    Packs/day: 0.80    Years: 20.00    Total pack years: 16.00    Types: Cigarettes    Quit date: 12/31/1978    Years since quitting: 43.8   Smokeless tobacco: Never  Vaping Use   Vaping Use: Never used  Substance and Sexual Activity   Alcohol use: Yes    Comment: rarely   Drug use: No   Sexual activity: Never    Birth control/protection: Surgical    Comment: widowed since 2010  Other Topics Concern   Not on file  Social History Narrative   ** Merged History Encounter **       Lives at home. Her daughter lives with her.  Caffeine use: 1 cup coffee per day    Social Determinants of Health   Financial Resource Strain: Not on file  Food Insecurity: Not on file  Transportation Needs: Not on file   Physical Activity: Not on file  Stress: Not on file  Social Connections: Not on file    Review of Systems: Gen: Denies fever, chills, cold or  flu like symptoms, pre-syncope, or syncope.  CV: Denies chest pain, palpitations. Resp: Denies dyspnea at rest, cough. GI: See HPI Heme: See HPI  Physical Exam: There were no vitals taken for this visit. General:   Alert and oriented. No distress noted. Pleasant and cooperative.  Head:  Normocephalic and atraumatic. Eyes:  Conjuctiva clear without scleral icterus. Heart:  S1, S2 present without murmurs appreciated. Lungs:  Clear to auscultation bilaterally. No wheezes, rales, or rhonchi. No distress.  Abdomen:  +BS, soft, non-tender and non-distended. No rebound or guarding. No HSM or masses noted. Msk:  Symmetrical without gross deformities. Normal posture. Extremities:  Without edema. Neurologic:  Alert and  oriented x4 Psych:  Normal mood and affect.    Assessment:     Plan:  ***   Aliene Altes, PA-C [facility] 10/10/2022

## 2022-10-10 ENCOUNTER — Ambulatory Visit (INDEPENDENT_AMBULATORY_CARE_PROVIDER_SITE_OTHER): Payer: Medicare Other | Admitting: Gastroenterology

## 2022-10-10 ENCOUNTER — Other Ambulatory Visit: Payer: Self-pay | Admitting: Endocrinology

## 2022-10-10 ENCOUNTER — Encounter: Payer: Self-pay | Admitting: Gastroenterology

## 2022-10-10 VITALS — BP 136/80 | HR 93 | Temp 97.6°F | Ht 61.0 in | Wt 93.2 lb

## 2022-10-10 DIAGNOSIS — K5900 Constipation, unspecified: Secondary | ICD-10-CM | POA: Insufficient documentation

## 2022-10-10 DIAGNOSIS — R634 Abnormal weight loss: Secondary | ICD-10-CM | POA: Diagnosis not present

## 2022-10-10 DIAGNOSIS — E890 Postprocedural hypothyroidism: Secondary | ICD-10-CM

## 2022-10-10 DIAGNOSIS — R1313 Dysphagia, pharyngeal phase: Secondary | ICD-10-CM

## 2022-10-10 DIAGNOSIS — K219 Gastro-esophageal reflux disease without esophagitis: Secondary | ICD-10-CM | POA: Diagnosis not present

## 2022-10-10 NOTE — Patient Instructions (Signed)
We are placing another referral to a dietitian.  I have asked that they call your daughter, Faustino Congress, to schedule the appointment.  Continue following a soft diet.  You can try putting food into a blender to help with this.  I recommend that you drink 3 Ensure daily.   You should be crushing her medications and taking them in yogurt or applesauce as speech therapy has recommended.  Continue taking omeprazole 40 mg daily. You can open your omeprazole capsule and pour out the contents into yogurt and take it that way.  Continue senna for constipation.  We will follow-up with you in about 3 months.  Please call sooner if you have questions or concerns.  Aliene Altes, PA-C [facility]

## 2022-10-11 ENCOUNTER — Telehealth: Payer: Self-pay | Admitting: *Deleted

## 2022-10-11 ENCOUNTER — Other Ambulatory Visit: Payer: Self-pay | Admitting: Gastroenterology

## 2022-10-11 DIAGNOSIS — J45909 Unspecified asthma, uncomplicated: Secondary | ICD-10-CM | POA: Diagnosis not present

## 2022-10-11 DIAGNOSIS — R5383 Other fatigue: Secondary | ICD-10-CM | POA: Diagnosis not present

## 2022-10-11 DIAGNOSIS — E46 Unspecified protein-calorie malnutrition: Secondary | ICD-10-CM | POA: Diagnosis not present

## 2022-10-11 DIAGNOSIS — S41112D Laceration without foreign body of left upper arm, subsequent encounter: Secondary | ICD-10-CM | POA: Diagnosis not present

## 2022-10-11 DIAGNOSIS — E782 Mixed hyperlipidemia: Secondary | ICD-10-CM | POA: Diagnosis not present

## 2022-10-11 DIAGNOSIS — K219 Gastro-esophageal reflux disease without esophagitis: Secondary | ICD-10-CM

## 2022-10-11 DIAGNOSIS — R1312 Dysphagia, oropharyngeal phase: Secondary | ICD-10-CM | POA: Diagnosis not present

## 2022-10-11 MED ORDER — OMEPRAZOLE 40 MG PO CPDR: 40.0000 mg | DELAYED_RELEASE_CAPSULE | Freq: Every day | ORAL | 1 refills | Status: DC

## 2022-10-11 NOTE — Telephone Encounter (Signed)
Rx sent 

## 2022-10-11 NOTE — Telephone Encounter (Signed)
Received a new prescription request from [facility] for Omeprazole '40mg'$ 

## 2022-10-12 DIAGNOSIS — R1312 Dysphagia, oropharyngeal phase: Secondary | ICD-10-CM | POA: Diagnosis not present

## 2022-10-12 DIAGNOSIS — S41112D Laceration without foreign body of left upper arm, subsequent encounter: Secondary | ICD-10-CM | POA: Diagnosis not present

## 2022-10-12 DIAGNOSIS — J45909 Unspecified asthma, uncomplicated: Secondary | ICD-10-CM | POA: Diagnosis not present

## 2022-10-12 DIAGNOSIS — E782 Mixed hyperlipidemia: Secondary | ICD-10-CM | POA: Diagnosis not present

## 2022-10-12 DIAGNOSIS — E46 Unspecified protein-calorie malnutrition: Secondary | ICD-10-CM | POA: Diagnosis not present

## 2022-10-12 DIAGNOSIS — R5383 Other fatigue: Secondary | ICD-10-CM | POA: Diagnosis not present

## 2022-10-15 DIAGNOSIS — J45909 Unspecified asthma, uncomplicated: Secondary | ICD-10-CM | POA: Diagnosis not present

## 2022-10-15 DIAGNOSIS — E46 Unspecified protein-calorie malnutrition: Secondary | ICD-10-CM | POA: Diagnosis not present

## 2022-10-15 DIAGNOSIS — R5383 Other fatigue: Secondary | ICD-10-CM | POA: Diagnosis not present

## 2022-10-15 DIAGNOSIS — R1312 Dysphagia, oropharyngeal phase: Secondary | ICD-10-CM | POA: Diagnosis not present

## 2022-10-15 DIAGNOSIS — E782 Mixed hyperlipidemia: Secondary | ICD-10-CM | POA: Diagnosis not present

## 2022-10-15 DIAGNOSIS — S41112D Laceration without foreign body of left upper arm, subsequent encounter: Secondary | ICD-10-CM | POA: Diagnosis not present

## 2022-10-17 DIAGNOSIS — E46 Unspecified protein-calorie malnutrition: Secondary | ICD-10-CM | POA: Diagnosis not present

## 2022-10-17 DIAGNOSIS — S41112D Laceration without foreign body of left upper arm, subsequent encounter: Secondary | ICD-10-CM | POA: Diagnosis not present

## 2022-10-17 DIAGNOSIS — J45909 Unspecified asthma, uncomplicated: Secondary | ICD-10-CM | POA: Diagnosis not present

## 2022-10-17 DIAGNOSIS — R1312 Dysphagia, oropharyngeal phase: Secondary | ICD-10-CM | POA: Diagnosis not present

## 2022-10-17 DIAGNOSIS — R5383 Other fatigue: Secondary | ICD-10-CM | POA: Diagnosis not present

## 2022-10-17 DIAGNOSIS — E782 Mixed hyperlipidemia: Secondary | ICD-10-CM | POA: Diagnosis not present

## 2022-10-19 DIAGNOSIS — R5383 Other fatigue: Secondary | ICD-10-CM | POA: Diagnosis not present

## 2022-10-19 DIAGNOSIS — J45909 Unspecified asthma, uncomplicated: Secondary | ICD-10-CM | POA: Diagnosis not present

## 2022-10-19 DIAGNOSIS — S41112D Laceration without foreign body of left upper arm, subsequent encounter: Secondary | ICD-10-CM | POA: Diagnosis not present

## 2022-10-19 DIAGNOSIS — E782 Mixed hyperlipidemia: Secondary | ICD-10-CM | POA: Diagnosis not present

## 2022-10-19 DIAGNOSIS — R1312 Dysphagia, oropharyngeal phase: Secondary | ICD-10-CM | POA: Diagnosis not present

## 2022-10-19 DIAGNOSIS — E46 Unspecified protein-calorie malnutrition: Secondary | ICD-10-CM | POA: Diagnosis not present

## 2022-10-22 DIAGNOSIS — Z961 Presence of intraocular lens: Secondary | ICD-10-CM | POA: Diagnosis not present

## 2022-10-22 DIAGNOSIS — H52203 Unspecified astigmatism, bilateral: Secondary | ICD-10-CM | POA: Diagnosis not present

## 2022-10-22 DIAGNOSIS — H21233 Degeneration of iris (pigmentary), bilateral: Secondary | ICD-10-CM | POA: Diagnosis not present

## 2022-10-22 DIAGNOSIS — H40013 Open angle with borderline findings, low risk, bilateral: Secondary | ICD-10-CM | POA: Diagnosis not present

## 2022-10-23 DIAGNOSIS — N1831 Chronic kidney disease, stage 3a: Secondary | ICD-10-CM | POA: Diagnosis not present

## 2022-10-23 DIAGNOSIS — Z23 Encounter for immunization: Secondary | ICD-10-CM | POA: Diagnosis not present

## 2022-10-23 DIAGNOSIS — R1310 Dysphagia, unspecified: Secondary | ICD-10-CM | POA: Diagnosis not present

## 2022-10-23 DIAGNOSIS — T148XXD Other injury of unspecified body region, subsequent encounter: Secondary | ICD-10-CM | POA: Diagnosis not present

## 2022-10-23 DIAGNOSIS — D649 Anemia, unspecified: Secondary | ICD-10-CM | POA: Diagnosis not present

## 2022-10-23 DIAGNOSIS — R0781 Pleurodynia: Secondary | ICD-10-CM | POA: Diagnosis not present

## 2022-10-23 DIAGNOSIS — E782 Mixed hyperlipidemia: Secondary | ICD-10-CM | POA: Diagnosis not present

## 2022-10-23 DIAGNOSIS — Z9189 Other specified personal risk factors, not elsewhere classified: Secondary | ICD-10-CM | POA: Diagnosis not present

## 2022-10-23 DIAGNOSIS — E43 Unspecified severe protein-calorie malnutrition: Secondary | ICD-10-CM | POA: Diagnosis not present

## 2022-10-24 DIAGNOSIS — R5383 Other fatigue: Secondary | ICD-10-CM | POA: Diagnosis not present

## 2022-10-24 DIAGNOSIS — E782 Mixed hyperlipidemia: Secondary | ICD-10-CM | POA: Diagnosis not present

## 2022-10-24 DIAGNOSIS — E46 Unspecified protein-calorie malnutrition: Secondary | ICD-10-CM | POA: Diagnosis not present

## 2022-10-24 DIAGNOSIS — S41112D Laceration without foreign body of left upper arm, subsequent encounter: Secondary | ICD-10-CM | POA: Diagnosis not present

## 2022-10-24 DIAGNOSIS — J45909 Unspecified asthma, uncomplicated: Secondary | ICD-10-CM | POA: Diagnosis not present

## 2022-10-24 DIAGNOSIS — R1312 Dysphagia, oropharyngeal phase: Secondary | ICD-10-CM | POA: Diagnosis not present

## 2022-10-25 DIAGNOSIS — E46 Unspecified protein-calorie malnutrition: Secondary | ICD-10-CM | POA: Diagnosis not present

## 2022-10-25 DIAGNOSIS — J45909 Unspecified asthma, uncomplicated: Secondary | ICD-10-CM | POA: Diagnosis not present

## 2022-10-25 DIAGNOSIS — S41112D Laceration without foreign body of left upper arm, subsequent encounter: Secondary | ICD-10-CM | POA: Diagnosis not present

## 2022-10-25 DIAGNOSIS — R1312 Dysphagia, oropharyngeal phase: Secondary | ICD-10-CM | POA: Diagnosis not present

## 2022-10-25 DIAGNOSIS — E782 Mixed hyperlipidemia: Secondary | ICD-10-CM | POA: Diagnosis not present

## 2022-10-25 DIAGNOSIS — R5383 Other fatigue: Secondary | ICD-10-CM | POA: Diagnosis not present

## 2022-10-26 DIAGNOSIS — Z8616 Personal history of COVID-19: Secondary | ICD-10-CM | POA: Diagnosis not present

## 2022-10-26 DIAGNOSIS — Z681 Body mass index (BMI) 19 or less, adult: Secondary | ICD-10-CM | POA: Diagnosis not present

## 2022-10-26 DIAGNOSIS — J45909 Unspecified asthma, uncomplicated: Secondary | ICD-10-CM | POA: Diagnosis not present

## 2022-10-26 DIAGNOSIS — E782 Mixed hyperlipidemia: Secondary | ICD-10-CM | POA: Diagnosis not present

## 2022-10-26 DIAGNOSIS — R1312 Dysphagia, oropharyngeal phase: Secondary | ICD-10-CM | POA: Diagnosis not present

## 2022-10-26 DIAGNOSIS — I519 Heart disease, unspecified: Secondary | ICD-10-CM | POA: Diagnosis not present

## 2022-10-26 DIAGNOSIS — S51811D Laceration without foreign body of right forearm, subsequent encounter: Secondary | ICD-10-CM | POA: Diagnosis not present

## 2022-10-26 DIAGNOSIS — W19XXXD Unspecified fall, subsequent encounter: Secondary | ICD-10-CM | POA: Diagnosis not present

## 2022-10-26 DIAGNOSIS — E46 Unspecified protein-calorie malnutrition: Secondary | ICD-10-CM | POA: Diagnosis not present

## 2022-10-26 DIAGNOSIS — S41112D Laceration without foreign body of left upper arm, subsequent encounter: Secondary | ICD-10-CM | POA: Diagnosis not present

## 2022-10-26 DIAGNOSIS — S51812D Laceration without foreign body of left forearm, subsequent encounter: Secondary | ICD-10-CM | POA: Diagnosis not present

## 2022-10-26 DIAGNOSIS — Z9181 History of falling: Secondary | ICD-10-CM | POA: Diagnosis not present

## 2022-10-26 DIAGNOSIS — R5383 Other fatigue: Secondary | ICD-10-CM | POA: Diagnosis not present

## 2022-10-30 DIAGNOSIS — S51812D Laceration without foreign body of left forearm, subsequent encounter: Secondary | ICD-10-CM | POA: Diagnosis not present

## 2022-10-30 DIAGNOSIS — J45909 Unspecified asthma, uncomplicated: Secondary | ICD-10-CM | POA: Diagnosis not present

## 2022-10-30 DIAGNOSIS — S51811D Laceration without foreign body of right forearm, subsequent encounter: Secondary | ICD-10-CM | POA: Diagnosis not present

## 2022-10-30 DIAGNOSIS — E782 Mixed hyperlipidemia: Secondary | ICD-10-CM | POA: Diagnosis not present

## 2022-10-30 DIAGNOSIS — S41112D Laceration without foreign body of left upper arm, subsequent encounter: Secondary | ICD-10-CM | POA: Diagnosis not present

## 2022-10-30 DIAGNOSIS — E46 Unspecified protein-calorie malnutrition: Secondary | ICD-10-CM | POA: Diagnosis not present

## 2022-10-31 DIAGNOSIS — S51811D Laceration without foreign body of right forearm, subsequent encounter: Secondary | ICD-10-CM | POA: Diagnosis not present

## 2022-10-31 DIAGNOSIS — S41112D Laceration without foreign body of left upper arm, subsequent encounter: Secondary | ICD-10-CM | POA: Diagnosis not present

## 2022-10-31 DIAGNOSIS — E782 Mixed hyperlipidemia: Secondary | ICD-10-CM | POA: Diagnosis not present

## 2022-10-31 DIAGNOSIS — E46 Unspecified protein-calorie malnutrition: Secondary | ICD-10-CM | POA: Diagnosis not present

## 2022-10-31 DIAGNOSIS — S51812D Laceration without foreign body of left forearm, subsequent encounter: Secondary | ICD-10-CM | POA: Diagnosis not present

## 2022-10-31 DIAGNOSIS — J45909 Unspecified asthma, uncomplicated: Secondary | ICD-10-CM | POA: Diagnosis not present

## 2022-11-01 ENCOUNTER — Other Ambulatory Visit (HOSPITAL_COMMUNITY): Payer: Self-pay | Admitting: Family Medicine

## 2022-11-01 ENCOUNTER — Ambulatory Visit (HOSPITAL_COMMUNITY)
Admission: RE | Admit: 2022-11-01 | Discharge: 2022-11-01 | Disposition: A | Discharge status: Home / Self Care | Payer: Medicare Other | Source: Ambulatory Visit | Attending: Family Medicine | Admitting: Family Medicine

## 2022-11-01 DIAGNOSIS — M7989 Other specified soft tissue disorders: Secondary | ICD-10-CM | POA: Diagnosis not present

## 2022-11-01 DIAGNOSIS — R2242 Localized swelling, mass and lump, left lower limb: Secondary | ICD-10-CM | POA: Insufficient documentation

## 2022-11-01 DIAGNOSIS — M25469 Effusion, unspecified knee: Secondary | ICD-10-CM | POA: Diagnosis not present

## 2022-11-02 DIAGNOSIS — E46 Unspecified protein-calorie malnutrition: Secondary | ICD-10-CM | POA: Diagnosis not present

## 2022-11-02 DIAGNOSIS — S41112D Laceration without foreign body of left upper arm, subsequent encounter: Secondary | ICD-10-CM | POA: Diagnosis not present

## 2022-11-02 DIAGNOSIS — J45909 Unspecified asthma, uncomplicated: Secondary | ICD-10-CM | POA: Diagnosis not present

## 2022-11-02 DIAGNOSIS — S51812D Laceration without foreign body of left forearm, subsequent encounter: Secondary | ICD-10-CM | POA: Diagnosis not present

## 2022-11-02 DIAGNOSIS — E782 Mixed hyperlipidemia: Secondary | ICD-10-CM | POA: Diagnosis not present

## 2022-11-02 DIAGNOSIS — S51811D Laceration without foreign body of right forearm, subsequent encounter: Secondary | ICD-10-CM | POA: Diagnosis not present

## 2022-11-05 ENCOUNTER — Other Ambulatory Visit (HOSPITAL_COMMUNITY): Payer: Self-pay | Admitting: Family Medicine

## 2022-11-05 DIAGNOSIS — R2242 Localized swelling, mass and lump, left lower limb: Secondary | ICD-10-CM

## 2022-11-05 DIAGNOSIS — T148XXA Other injury of unspecified body region, initial encounter: Secondary | ICD-10-CM | POA: Diagnosis not present

## 2022-11-07 DIAGNOSIS — S41112D Laceration without foreign body of left upper arm, subsequent encounter: Secondary | ICD-10-CM | POA: Diagnosis not present

## 2022-11-07 DIAGNOSIS — E782 Mixed hyperlipidemia: Secondary | ICD-10-CM | POA: Diagnosis not present

## 2022-11-07 DIAGNOSIS — J45909 Unspecified asthma, uncomplicated: Secondary | ICD-10-CM | POA: Diagnosis not present

## 2022-11-07 DIAGNOSIS — S51811D Laceration without foreign body of right forearm, subsequent encounter: Secondary | ICD-10-CM | POA: Diagnosis not present

## 2022-11-07 DIAGNOSIS — E46 Unspecified protein-calorie malnutrition: Secondary | ICD-10-CM | POA: Diagnosis not present

## 2022-11-07 DIAGNOSIS — S51812D Laceration without foreign body of left forearm, subsequent encounter: Secondary | ICD-10-CM | POA: Diagnosis not present

## 2022-11-09 DIAGNOSIS — E46 Unspecified protein-calorie malnutrition: Secondary | ICD-10-CM | POA: Diagnosis not present

## 2022-11-09 DIAGNOSIS — E782 Mixed hyperlipidemia: Secondary | ICD-10-CM | POA: Diagnosis not present

## 2022-11-09 DIAGNOSIS — J45909 Unspecified asthma, uncomplicated: Secondary | ICD-10-CM | POA: Diagnosis not present

## 2022-11-09 DIAGNOSIS — S41112D Laceration without foreign body of left upper arm, subsequent encounter: Secondary | ICD-10-CM | POA: Diagnosis not present

## 2022-11-09 DIAGNOSIS — S51811D Laceration without foreign body of right forearm, subsequent encounter: Secondary | ICD-10-CM | POA: Diagnosis not present

## 2022-11-09 DIAGNOSIS — S51812D Laceration without foreign body of left forearm, subsequent encounter: Secondary | ICD-10-CM | POA: Diagnosis not present

## 2022-11-11 ENCOUNTER — Emergency Department (HOSPITAL_COMMUNITY): Payer: Medicare Other

## 2022-11-11 ENCOUNTER — Emergency Department (HOSPITAL_COMMUNITY)
Admission: EM | Admit: 2022-11-11 | Discharge: 2022-11-11 | Disposition: A | Discharge status: Home / Self Care | Payer: Medicare Other | Attending: Emergency Medicine | Admitting: Emergency Medicine

## 2022-11-11 ENCOUNTER — Other Ambulatory Visit: Payer: Self-pay

## 2022-11-11 ENCOUNTER — Encounter (HOSPITAL_COMMUNITY): Payer: Self-pay | Admitting: *Deleted

## 2022-11-11 DIAGNOSIS — R52 Pain, unspecified: Secondary | ICD-10-CM | POA: Diagnosis not present

## 2022-11-11 DIAGNOSIS — M1712 Unilateral primary osteoarthritis, left knee: Secondary | ICD-10-CM | POA: Diagnosis not present

## 2022-11-11 DIAGNOSIS — J449 Chronic obstructive pulmonary disease, unspecified: Secondary | ICD-10-CM | POA: Diagnosis not present

## 2022-11-11 DIAGNOSIS — Z7901 Long term (current) use of anticoagulants: Secondary | ICD-10-CM | POA: Diagnosis not present

## 2022-11-11 DIAGNOSIS — Z7982 Long term (current) use of aspirin: Secondary | ICD-10-CM | POA: Insufficient documentation

## 2022-11-11 DIAGNOSIS — M79605 Pain in left leg: Secondary | ICD-10-CM | POA: Diagnosis not present

## 2022-11-11 DIAGNOSIS — M25562 Pain in left knee: Secondary | ICD-10-CM | POA: Insufficient documentation

## 2022-11-11 DIAGNOSIS — R0689 Other abnormalities of breathing: Secondary | ICD-10-CM | POA: Diagnosis not present

## 2022-11-11 DIAGNOSIS — M25462 Effusion, left knee: Secondary | ICD-10-CM | POA: Diagnosis not present

## 2022-11-11 DIAGNOSIS — T68XXXA Hypothermia, initial encounter: Secondary | ICD-10-CM | POA: Diagnosis not present

## 2022-11-11 DIAGNOSIS — I10 Essential (primary) hypertension: Secondary | ICD-10-CM | POA: Diagnosis not present

## 2022-11-11 MED ORDER — OXYCODONE-ACETAMINOPHEN 5-325 MG PO TABS: ORAL_TABLET | ORAL | 0 refills | Status: DC

## 2022-11-11 MED ORDER — OXYCODONE-ACETAMINOPHEN 5-325 MG PO TABS
1.0000 | ORAL_TABLET | Freq: Once | ORAL | Status: AC
  Administered 2022-11-11: 1 via ORAL
  Filled 2022-11-11: qty 1

## 2022-11-11 NOTE — ED Provider Notes (Signed)
[facility] EMERGENCY DEPARTMENT Provider Note   CSN: [id number] Arrival date & time: 11/11/22  1551     History {Add pertinent medical, surgical, social history, OB history to HPI:1} Chief Complaint  Patient presents with   Leg Pain    Christine Leblanc is a 88 y.o. female.  Patient has a history of seizures and COPD.  She has been having left leg pain.  They put her on Eliquis but have not gotten an ultrasound of her leg yet.  Her left knee is swollen and tender   Leg Pain      Home Medications Prior to Admission medications   Medication Sig Start Date End Date Taking? Authorizing Provider  oxyCODONE-acetaminophen (PERCOCET/ROXICET) 5-325 MG tablet Take 1 pill every 8 hours for pain not relieved by Tylenol alone 11/11/22  Yes Milton Ferguson, MD  albuterol (VENTOLIN HFA) 108 (90 Base) MCG/ACT inhaler Inhale 1-2 puffs into the lungs every 6 (six) hours as needed for wheezing or shortness of breath. Wheezing, Asthma Symptoms 06/23/22   Damita Lack, MD  aspirin EC 81 MG tablet Take 81 mg by mouth daily. Swallow whole.    [provider]  Carbinoxamine Maleate 4 MG TABS Take 1 tablet (4 mg total) by mouth every 8 (eight) hours. Patient taking differently: Take 1 tablet by mouth at bedtime. 06/09/21 08/29/22  Valentina Shaggy, MD  EPINEPHrine 0.3 mg/0.3 mL IJ SOAJ injection Inject into the muscle as needed for anaphylaxis. 08/13/18   [provider]  ferrous sulfate 325 (65 FE) MG tablet Take 325 mg by mouth daily with breakfast.    [provider]  Glucosamine-Chondroit-Vit C-Mn (GLUCOSAMINE 1500 COMPLEX PO) Take by mouth. Takes '1500mg'$  with Chonroitin '1200mg'$  once daily arthritis pain relief- 8 hr caplet once as needed.    [provider]  ipratropium (ATROVENT) 0.06 % nasal spray USE 2 SPRAYS IN [facility] NOSTRIL THREE TIMES A DAY 07/25/22   Valentina Shaggy, MD  levothyroxine (SYNTHROID) 88 MCG tablet TAKE 1 TABLET DAILY ON MONDAY THROUGH  SATURDAY AND ONE-HALF (1/2) TABLET ON SUNDAY 10/10/22   Elayne Snare, MD  omeprazole (PRILOSEC) 40 MG capsule Take 1 capsule (40 mg total) by mouth daily before breakfast. 10/11/22   Erenest Rasher, PA-C  pravastatin (PRAVACHOL) 40 MG tablet TAKE 2 TABLETS DAILY 07/26/22   Ahmed Prima, Fransisco Hertz, PA-C  promethazine-dextromethorphan (PROMETHAZINE-DM) 6.25-15 MG/5ML syrup Take 5 mLs by mouth at bedtime as needed for cough. Patient not taking: Reported on 10/10/2022 01/15/22   Jaynee Eagles, PA-C  senna (SENOKOT) 8.6 MG tablet Take 2 tablets by mouth at bedtime.     [provider]      Allergies    Celecoxib, Dexilant [dexlansoprazole], and Famotidine    Review of Systems   Review of Systems  Physical Exam Updated Vital Signs BP (!) 130/92   Pulse 74   Resp 18   Ht '5\' 1"'$  (1.549 m)   Wt 42.3 kg   SpO2 99%   BMI 17.62 kg/m  Physical Exam  ED Results / Procedures / Treatments   Labs (all labs ordered are listed, but only abnormal results are displayed) Labs Reviewed - No data to display  EKG None  Radiology DG Knee Complete 4 Views Left  Result Date: 11/11/2022 CLINICAL DATA:  pain EXAM: LEFT KNEE - COMPLETE 4+ VIEW COMPARISON:  None Available. FINDINGS: Chondrocalcinosis. No evidence of fracture or dislocation. At least moderate volume joint effusion. Mild-to-moderate tricompartmental degenerative changes. Soft tissues are unremarkable.  IMPRESSION: 1. No acute displaced fracture or dislocation. 2. Joint effusion. Electronically Signed   By: Iven Finn M.D.   On: 11/11/2022 17:10    Procedures Procedures  {Document cardiac monitor, telemetry assessment procedure when appropriate:1}  Medications Ordered in ED Medications  oxyCODONE-acetaminophen (PERCOCET/ROXICET) 5-325 MG per tablet 1 tablet (1 tablet Oral Given 11/11/22 1745)    ED Course/ Medical Decision Making/ A&P                           Medical Decision Making Amount and/or Complexity of Data  Reviewed Radiology: ordered.  Risk Prescription drug management.   Patient with painful left knee.  Suspect severe arthritis with small effusion to left knee.  She will stop the Eliquis until she gets the ultrasound tomorrow and is given pain medicine and will follow-up with her family doctor  {Document critical care time when appropriate:1} {Document review of labs and clinical decision tools ie heart score, Chads2Vasc2 etc:1}  {Document your independent review of radiology images, and any outside records:1} {Document your discussion with family members, caretakers, and with consultants:1} {Document social determinants of health affecting pt's care:1} {Document your decision making why or why not admission, treatments were needed:1} Final Clinical Impression(s) / ED Diagnoses Final diagnoses:  Acute pain of left knee    Rx / DC Orders ED Discharge Orders          Ordered    oxyCODONE-acetaminophen (PERCOCET/ROXICET) 5-325 MG tablet        11/11/22 1819

## 2022-11-11 NOTE — ED Triage Notes (Signed)
Pt brought in by [facility] for c/o left leg pain; pt states she is supposed to have a "scan" of her leg tomorrow to check for a blood clot    Pt has hx of blood clot in same leg  Cbg 170

## 2022-11-11 NOTE — Discharge Instructions (Signed)
Stop taking your blood thinner.  Get your ultrasound of your leg and if it shows a blood clot they can start you back on the blood thinner.  Keep your leg elevated.  Follow-up with your family doctor this week

## 2022-11-12 ENCOUNTER — Ambulatory Visit (HOSPITAL_COMMUNITY)
Admission: RE | Admit: 2022-11-12 | Discharge: 2022-11-12 | Disposition: A | Discharge status: Home / Self Care | Payer: Medicare Other | Source: Ambulatory Visit | Attending: Family Medicine | Admitting: Family Medicine

## 2022-11-12 DIAGNOSIS — R2242 Localized swelling, mass and lump, left lower limb: Secondary | ICD-10-CM | POA: Diagnosis not present

## 2022-11-12 DIAGNOSIS — M7989 Other specified soft tissue disorders: Secondary | ICD-10-CM | POA: Diagnosis not present

## 2022-11-13 DIAGNOSIS — R2242 Localized swelling, mass and lump, left lower limb: Secondary | ICD-10-CM | POA: Diagnosis not present

## 2022-11-13 DIAGNOSIS — E782 Mixed hyperlipidemia: Secondary | ICD-10-CM | POA: Diagnosis not present

## 2022-11-13 DIAGNOSIS — E43 Unspecified severe protein-calorie malnutrition: Secondary | ICD-10-CM | POA: Diagnosis not present

## 2022-11-13 DIAGNOSIS — M25469 Effusion, unspecified knee: Secondary | ICD-10-CM | POA: Diagnosis not present

## 2022-11-13 DIAGNOSIS — D649 Anemia, unspecified: Secondary | ICD-10-CM | POA: Diagnosis not present

## 2022-11-14 DIAGNOSIS — S51812D Laceration without foreign body of left forearm, subsequent encounter: Secondary | ICD-10-CM | POA: Diagnosis not present

## 2022-11-14 DIAGNOSIS — E46 Unspecified protein-calorie malnutrition: Secondary | ICD-10-CM | POA: Diagnosis not present

## 2022-11-14 DIAGNOSIS — S51811D Laceration without foreign body of right forearm, subsequent encounter: Secondary | ICD-10-CM | POA: Diagnosis not present

## 2022-11-14 DIAGNOSIS — E782 Mixed hyperlipidemia: Secondary | ICD-10-CM | POA: Diagnosis not present

## 2022-11-14 DIAGNOSIS — J45909 Unspecified asthma, uncomplicated: Secondary | ICD-10-CM | POA: Diagnosis not present

## 2022-11-14 DIAGNOSIS — S41112D Laceration without foreign body of left upper arm, subsequent encounter: Secondary | ICD-10-CM | POA: Diagnosis not present

## 2022-11-15 ENCOUNTER — Telehealth: Payer: Self-pay

## 2022-11-15 NOTE — Telephone Encounter (Signed)
Patient would like a call regarding her appointment and why it was changed.

## 2022-11-15 NOTE — Telephone Encounter (Signed)
Appointment was rescheduled due to Dr. Dwyane Dee being out of office on 12/17/2022.  Message left on daughter's cell phone voice mail.

## 2022-11-15 NOTE — Telephone Encounter (Signed)
     Patient  visit on 11/12  at [place] you been able to follow up with your primary care physician? Yes   The patient was or was not able to obtain any needed medicine or equipment. Yes   Are there diet recommendations that you are having difficulty following? na  Patient expresses understanding of discharge instructions and education provided has no other needs at this time.  Yes      [place], [facility], Care Management  [phone] [address], [place], [place] [address] Phone: [phone] Email: [email or web address]

## 2022-11-16 DIAGNOSIS — S51811D Laceration without foreign body of right forearm, subsequent encounter: Secondary | ICD-10-CM | POA: Diagnosis not present

## 2022-11-16 DIAGNOSIS — J45909 Unspecified asthma, uncomplicated: Secondary | ICD-10-CM | POA: Diagnosis not present

## 2022-11-16 DIAGNOSIS — E46 Unspecified protein-calorie malnutrition: Secondary | ICD-10-CM | POA: Diagnosis not present

## 2022-11-16 DIAGNOSIS — S51812D Laceration without foreign body of left forearm, subsequent encounter: Secondary | ICD-10-CM | POA: Diagnosis not present

## 2022-11-16 DIAGNOSIS — E782 Mixed hyperlipidemia: Secondary | ICD-10-CM | POA: Diagnosis not present

## 2022-11-16 DIAGNOSIS — S41112D Laceration without foreign body of left upper arm, subsequent encounter: Secondary | ICD-10-CM | POA: Diagnosis not present

## 2022-11-20 DIAGNOSIS — S41112D Laceration without foreign body of left upper arm, subsequent encounter: Secondary | ICD-10-CM | POA: Diagnosis not present

## 2022-11-20 DIAGNOSIS — J45909 Unspecified asthma, uncomplicated: Secondary | ICD-10-CM | POA: Diagnosis not present

## 2022-11-20 DIAGNOSIS — S51812D Laceration without foreign body of left forearm, subsequent encounter: Secondary | ICD-10-CM | POA: Diagnosis not present

## 2022-11-20 DIAGNOSIS — E46 Unspecified protein-calorie malnutrition: Secondary | ICD-10-CM | POA: Diagnosis not present

## 2022-11-20 DIAGNOSIS — E782 Mixed hyperlipidemia: Secondary | ICD-10-CM | POA: Diagnosis not present

## 2022-11-20 DIAGNOSIS — S51811D Laceration without foreign body of right forearm, subsequent encounter: Secondary | ICD-10-CM | POA: Diagnosis not present

## 2022-11-23 DIAGNOSIS — E46 Unspecified protein-calorie malnutrition: Secondary | ICD-10-CM | POA: Diagnosis not present

## 2022-11-23 DIAGNOSIS — J45909 Unspecified asthma, uncomplicated: Secondary | ICD-10-CM | POA: Diagnosis not present

## 2022-11-23 DIAGNOSIS — S41112D Laceration without foreign body of left upper arm, subsequent encounter: Secondary | ICD-10-CM | POA: Diagnosis not present

## 2022-11-23 DIAGNOSIS — S51811D Laceration without foreign body of right forearm, subsequent encounter: Secondary | ICD-10-CM | POA: Diagnosis not present

## 2022-11-23 DIAGNOSIS — E782 Mixed hyperlipidemia: Secondary | ICD-10-CM | POA: Diagnosis not present

## 2022-11-23 DIAGNOSIS — S51812D Laceration without foreign body of left forearm, subsequent encounter: Secondary | ICD-10-CM | POA: Diagnosis not present

## 2022-11-25 DIAGNOSIS — W19XXXD Unspecified fall, subsequent encounter: Secondary | ICD-10-CM | POA: Diagnosis not present

## 2022-11-25 DIAGNOSIS — Z681 Body mass index (BMI) 19 or less, adult: Secondary | ICD-10-CM | POA: Diagnosis not present

## 2022-11-25 DIAGNOSIS — I519 Heart disease, unspecified: Secondary | ICD-10-CM | POA: Diagnosis not present

## 2022-11-25 DIAGNOSIS — J45909 Unspecified asthma, uncomplicated: Secondary | ICD-10-CM | POA: Diagnosis not present

## 2022-11-25 DIAGNOSIS — S51812D Laceration without foreign body of left forearm, subsequent encounter: Secondary | ICD-10-CM | POA: Diagnosis not present

## 2022-11-25 DIAGNOSIS — S41112D Laceration without foreign body of left upper arm, subsequent encounter: Secondary | ICD-10-CM | POA: Diagnosis not present

## 2022-11-25 DIAGNOSIS — R1312 Dysphagia, oropharyngeal phase: Secondary | ICD-10-CM | POA: Diagnosis not present

## 2022-11-25 DIAGNOSIS — Z8616 Personal history of COVID-19: Secondary | ICD-10-CM | POA: Diagnosis not present

## 2022-11-25 DIAGNOSIS — Z9181 History of falling: Secondary | ICD-10-CM | POA: Diagnosis not present

## 2022-11-25 DIAGNOSIS — E782 Mixed hyperlipidemia: Secondary | ICD-10-CM | POA: Diagnosis not present

## 2022-11-25 DIAGNOSIS — S51811D Laceration without foreign body of right forearm, subsequent encounter: Secondary | ICD-10-CM | POA: Diagnosis not present

## 2022-11-25 DIAGNOSIS — E46 Unspecified protein-calorie malnutrition: Secondary | ICD-10-CM | POA: Diagnosis not present

## 2022-11-25 DIAGNOSIS — R5383 Other fatigue: Secondary | ICD-10-CM | POA: Diagnosis not present

## 2022-11-26 ENCOUNTER — Telehealth: Payer: Self-pay | Admitting: Orthopedic Surgery

## 2022-11-26 NOTE — Telephone Encounter (Signed)
Patient called at 11:52 am left voicemail she needs to make appt with Dr. Aline Leblanc.    I called the patient back and got her voicemail, left message to call the office back.

## 2022-11-29 DIAGNOSIS — S41112D Laceration without foreign body of left upper arm, subsequent encounter: Secondary | ICD-10-CM | POA: Diagnosis not present

## 2022-11-29 DIAGNOSIS — E782 Mixed hyperlipidemia: Secondary | ICD-10-CM | POA: Diagnosis not present

## 2022-11-29 DIAGNOSIS — S51812D Laceration without foreign body of left forearm, subsequent encounter: Secondary | ICD-10-CM | POA: Diagnosis not present

## 2022-11-29 DIAGNOSIS — E46 Unspecified protein-calorie malnutrition: Secondary | ICD-10-CM | POA: Diagnosis not present

## 2022-11-29 DIAGNOSIS — S51811D Laceration without foreign body of right forearm, subsequent encounter: Secondary | ICD-10-CM | POA: Diagnosis not present

## 2022-11-29 DIAGNOSIS — J45909 Unspecified asthma, uncomplicated: Secondary | ICD-10-CM | POA: Diagnosis not present

## 2022-11-30 DIAGNOSIS — M1712 Unilateral primary osteoarthritis, left knee: Secondary | ICD-10-CM | POA: Diagnosis not present

## 2022-12-04 ENCOUNTER — Other Ambulatory Visit (HOSPITAL_COMMUNITY): Payer: Self-pay | Admitting: Family Medicine

## 2022-12-04 ENCOUNTER — Ambulatory Visit (HOSPITAL_COMMUNITY)
Admission: RE | Admit: 2022-12-04 | Discharge: 2022-12-04 | Disposition: A | Discharge status: Home / Self Care | Payer: Medicare Other | Source: Ambulatory Visit | Attending: Family Medicine | Admitting: Family Medicine

## 2022-12-04 DIAGNOSIS — R2242 Localized swelling, mass and lump, left lower limb: Secondary | ICD-10-CM

## 2022-12-04 DIAGNOSIS — T148XXA Other injury of unspecified body region, initial encounter: Secondary | ICD-10-CM | POA: Diagnosis not present

## 2022-12-04 DIAGNOSIS — R600 Localized edema: Secondary | ICD-10-CM | POA: Diagnosis not present

## 2022-12-05 ENCOUNTER — Telehealth: Payer: Self-pay | Admitting: Cardiology

## 2022-12-05 NOTE — Telephone Encounter (Signed)
Spoke to pt who stated that she has had swelling in her left foot since Sunday. Pt stated she had a blood clot in her foot at the beginning of November, but it has since dissolved. Pt stated that her foot is cold to the touch and every now and then she gets a throbbing sensation, but nothing that's painful. Pt stated she doesn't believe it is another blood clot, but is concerned about circulation.   Please advise.

## 2022-12-05 NOTE — Telephone Encounter (Signed)
Pt c/o swelling: STAT is pt has developed SOB within 24 hours  How much weight have you gained and in what time span? Maybe a couple of lbs, but not sure   If swelling, where is the swelling located? In left foot   Are you currently taking a fluid pill? No  Are you currently SOB? No  Do you have a log of your daily weights (if so, list)? No  Have you gained 3 pounds in a day or 5 pounds in a week? Not sure.  Have you traveled recently? No  Pt states she has discoloration in both feet and swelling in the left foot. She believes she may need test run, but would like to speak to someone in regards to this.

## 2022-12-06 ENCOUNTER — Ambulatory Visit: Payer: Medicare Other | Admitting: Orthopedic Surgery

## 2022-12-06 NOTE — Telephone Encounter (Signed)
Defer to pcp who she has already seen about this problem, looks like they got an [place] whic showed no evidence of blod clot. Did they discuss with her the next steps?   Zandra Abts MD

## 2022-12-07 DIAGNOSIS — S51811D Laceration without foreign body of right forearm, subsequent encounter: Secondary | ICD-10-CM | POA: Diagnosis not present

## 2022-12-07 DIAGNOSIS — S41112D Laceration without foreign body of left upper arm, subsequent encounter: Secondary | ICD-10-CM | POA: Diagnosis not present

## 2022-12-07 DIAGNOSIS — E46 Unspecified protein-calorie malnutrition: Secondary | ICD-10-CM | POA: Diagnosis not present

## 2022-12-07 DIAGNOSIS — S51812D Laceration without foreign body of left forearm, subsequent encounter: Secondary | ICD-10-CM | POA: Diagnosis not present

## 2022-12-07 DIAGNOSIS — J45909 Unspecified asthma, uncomplicated: Secondary | ICD-10-CM | POA: Diagnosis not present

## 2022-12-07 DIAGNOSIS — E782 Mixed hyperlipidemia: Secondary | ICD-10-CM | POA: Diagnosis not present

## 2022-12-07 NOTE — Telephone Encounter (Signed)
Called pt and spoke to daughter who stated that now pt's other leg is swelling as well. Pt's daughter stated that PCP could not do anything about it the first time, so she does not think its a good idea to go back to them.  Please advise.

## 2022-12-12 ENCOUNTER — Ambulatory Visit: Payer: Medicare Other | Admitting: Nutrition

## 2022-12-13 ENCOUNTER — Ambulatory Visit: Payer: Medicare Other | Attending: General Practice | Admitting: Cardiology

## 2022-12-13 ENCOUNTER — Encounter: Payer: Self-pay | Admitting: Cardiology

## 2022-12-13 VITALS — BP 136/66 | HR 67 | Ht 61.0 in | Wt 98.0 lb

## 2022-12-13 DIAGNOSIS — M7989 Other specified soft tissue disorders: Secondary | ICD-10-CM | POA: Diagnosis not present

## 2022-12-13 NOTE — Patient Instructions (Addendum)
Medication Instructions:  Your physician recommends that you continue on your current medications as directed. Please refer to the Current Medication list given to you today.   Labwork: None  Testing/Procedures: None  Follow-Up: Follow up with Dr. Harl Bowie in 6 months  Any Other Special Instructions Will Be Listed Below (If Applicable).  Call Ortho to follow up for swelling   If you need a refill on your cardiac medications before your next appointment, please call your pharmacy.

## 2022-12-13 NOTE — Progress Notes (Signed)
Clinical Summary Christine Leblanc is a 88 y.o.female seen today for follow up of the following medical problems.   This is a focused visit for recent issues with left leg swelling   1. 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 Near syncope - seen 12/26/18 by PA [facility] for predominately positional dizziness with falls x 2 in October - Jan 2020 monitor showed rare ectopy, no significant arrhythmias       - no recent symptoms    2. HTN - 12/2019 appts 130s/70s - compliant with meds       3. Hyperlipidemia - 12/2019 TC 150 TG 66 HDL 60 LDL 77  - compliant with statin   4. Hx of TIA - on ASA and statin for secondary prevention - denies any recent symptoms   Past Medical History:  Diagnosis Date   Anemia    Asthma    Cancer ([place])    skin Ca- ? basal cell    Chronic back pain    Chronic neck pain    Degenerative joint disease    Left shoulder; cervical spine, knees & hands    Gastroesophageal reflux disease    Hiatal hernia; distal esophageal web requiring dilatation; gastric polyps; gastritis; refuses colonoscopy   GERD (gastroesophageal reflux disease)    History of stress test 1990's   stress test done under the care of Dr. Lattie Haw & Dr. Gwenlyn Found, now being followed by Dr. Harl Bowie- in [place] , recently seen & told to f/U in one yr.    Hyperlipidemia 04/27/2011   Hypertension    Hypothyroidism    Lymphocytic thyroiditis    Mild carotid artery disease ([place])    Multiple thyroid nodules 2010   Adenomatous; thyroidectomy in 2010   Orthostasis    Osteoarthritis of right knee 07/27/2014   Pneumonia    hosp. for pneumonia- long time ago    Primary localized osteoarthrosis of right shoulder 06/21/2015    Seizures ([place])    yes- as a child- & into adult years, states she took med. for them at one time, stopped at 32 yrs. of age    Syncope and collapse 2007   Possible CVA in 2012 with left lower extremity weakness; refused hospitalization; CT-Atrophy and chronic microvascular ischemic change.      Allergies  Allergen Reactions   Celecoxib Shortness Of Breath   Dexilant [Dexlansoprazole] Anaphylaxis    abd pain   Famotidine     Makes her feel "very bad"     Current Outpatient Medications  Medication Sig Dispense Refill   albuterol (VENTOLIN HFA) 108 (90 Base) MCG/ACT inhaler Inhale 1-2 puffs into the lungs every 6 (six) hours as needed for wheezing or shortness of breath. Wheezing, Asthma Symptoms 8 g 0   aspirin EC 81 MG tablet Take 81 mg by mouth daily. Swallow whole.     Carbinoxamine Maleate 4 MG TABS Take 1 tablet (4 mg total) by mouth every 8 (eight) hours. (Patient taking differently: Take 1 tablet by mouth at bedtime.) 270 tablet 2   EPINEPHrine 0.3 mg/0.3 mL IJ SOAJ injection Inject into the muscle as needed for anaphylaxis.     ferrous sulfate 325 (65 FE) MG  tablet Take 325 mg by mouth daily with breakfast.     Glucosamine-Chondroit-Vit C-Mn (GLUCOSAMINE 1500 COMPLEX PO) Take by mouth. Takes '1500mg'$  with Chonroitin '1200mg'$  once daily arthritis pain relief- 8 hr caplet once as needed.     ipratropium (ATROVENT) 0.06 % nasal spray USE 2 SPRAYS IN EACH NOSTRIL THREE TIMES A DAY 45 mL 1   levothyroxine (SYNTHROID) 88 MCG tablet TAKE 1 TABLET DAILY ON MONDAY THROUGH SATURDAY AND ONE-HALF (1/2) TABLET ON 'SUNDAY 90 tablet 3   omeprazole (PRILOSEC) 40 MG capsule Take 1 capsule (40 mg total) by mouth daily before breakfast. 90 capsule 1   oxyCODONE-acetaminophen (PERCOCET/ROXICET) 5-325 MG tablet Take 1 pill every 8 hours for pain not relieved by Tylenol alone 20 tablet 0   pravastatin (PRAVACHOL) 40 MG tablet TAKE 2 TABLETS DAILY 180 tablet 3   promethazine-dextromethorphan  (PROMETHAZINE-DM) 6.25-15 MG/5ML syrup Take 5 mLs by mouth at bedtime as needed for cough. (Patient not taking: Reported on 10/10/2022) 100 mL 0   senna (SENOKOT) 8.6 MG tablet Take 2 tablets by mouth at bedtime.      No current facility-administered medications for this visit.     Past Surgical History:  Procedure Laterality Date   ABDOMINAL HYSTERECTOMY     fibroids   CATARACT EXTRACTION     Bilateral; redo surgery on the right for incomplete primary procedure   COLONOSCOPY  Remote   ESOPHAGOGASTRODUODENOSCOPY  11/2009   Dr. Fields: probable distal web s/p dilation small hiatal hernia/gastric polyps/mild gastritis, appeared to have narrowing at the junction of D1 and D2 dilated up to 12 mm   ESOPHAGOGASTRODUODENOSCOPY N/A 03/21/2015   Surgeon: Sandi L Fields, MD; proximal esophageal web s/p dilation, multiple gastric polyps s/p multiple biopsies, mild erosive gastritis s/p biopsy.  Pathology with fundic gland polyps, chronic gastritis, negative for H. pylori.   EYE SURGERY     PROLAPSED UTERINE FIBROID LIGATION     outcomed with rectocele & cystocele   SAVORY DILATION N/A 03/21/2015   Procedure: SAVORY DILATION;  Surgeon: Sandi L Fields, MD;  Location: [facility] SUITE;  Service: Endoscopy;  Laterality: N/A;   SHOULDER SURGERY     left   TOTAL KNEE ARTHROPLASTY Right 07/27/2014   Procedure: RIGHT TOTAL KNEE ARTHROPLASTY;  Surgeon: Joshua P Landau, MD;  Location: [facility] OR;  Service: Orthopedics;  Laterality: Right;   TOTAL SHOULDER ARTHROPLASTY Right 06/21/2015   Procedure: RIGHT TOTAL SHOULDER ARTHROPLASTY;  Surgeon: Joshua Landau, MD;  Location: [facility] OR;  Service: Orthopedics;  Laterality: Right;   TOTAL THYROIDECTOMY  2010     Allergies  Allergen Reactions   Celecoxib Shortness Of Breath   Dexilant [Dexlansoprazole] Anaphylaxis    abd pain   Famotidine     Makes her feel "very bad"      Family History  Problem Relation Age of Onset   Heart disease Mother    Gallbladder  disease Mother    Heart failure Mother    Diabetes Mother    Breast cancer Sister        30'$  years ago   Diabetes Sister    Heart attack Father    Diabetes Maternal Grandmother    Colon cancer Neg Hx    Stroke Neg Hx      Social History Ms. Name reports that she quit smoking about 43 years ago. Her smoking use included cigarettes. She has a 16.00 pack-year smoking history. She has never used smokeless tobacco. Ms. Dietze reports current alcohol use.   Review  of Systems CONSTITUTIONAL: No weight loss, fever, chills, weakness or fatigue.  HEENT: Eyes: No visual loss, blurred vision, double vision or yellow sclerae.No hearing loss, sneezing, congestion, runny nose or sore throat.  SKIN: No rash or itching.  CARDIOVASCULAR: per hpi RESPIRATORY: No shortness of breath, cough or sputum.  GASTROINTESTINAL: No anorexia, nausea, vomiting or diarrhea. No abdominal pain or blood.  GENITOURINARY: No burning on urination, no polyuria NEUROLOGICAL: No headache, dizziness, syncope, paralysis, ataxia, numbness or tingling in the extremities. No change in bowel or bladder control.  MUSCULOSKELETAL: No muscle, back pain, joint pain or stiffness.  LYMPHATICS: No enlarged nodes. No history of splenectomy.  PSYCHIATRIC: No history of depression or anxiety.  ENDOCRINOLOGIC: No reports of sweating, cold or heat intolerance. No polyuria or polydipsia.  Marland Kitchen   Physical Examination Today's Vitals   12/13/22 1117  BP: 136/66  Pulse: 67  SpO2: 99%  Weight: 98 lb (44.5 kg)  Height: '5\' 1"'$  (1.549 m)   Body mass index is 18.52 kg/m.  Gen: resting comfortably, no acute distress HEENT: no scleral icterus, pupils equal round and reactive, no palptable cervical adenopathy,  CV: RRR, no m/r/g no jvd Resp: Clear to auscultation bilaterally GI: abdomen is soft, non-tender, non-distended, normal bowel sounds, no hepatosplenomegaly MSK: extremities are warm, 1+ left leg swelling  Skin: warm, no  rash Neuro:  no focal deficits Psych: appropriate affect   Diagnostic Studies     Assessment and Plan   1. Left leg swelling - this is not cardiac related swelling - appears she had LE [place] x 3, initial showing old DVT not noted on repeat studies, large bakers cyst noted on all scans - swelling not related to DVT, at most she has some remnants of old DVT - could be related to bakers cyst, she did have recent knee aspiration of that knee. Asked to discuss with orthopedics if any intervention may be required for bakers cyst. If thought bakers cyst is not the issue would need to refer to vascular       Arnoldo Lenis, M.D.

## 2022-12-14 DIAGNOSIS — M1712 Unilateral primary osteoarthritis, left knee: Secondary | ICD-10-CM | POA: Diagnosis not present

## 2022-12-17 ENCOUNTER — Ambulatory Visit: Payer: Medicare Other | Admitting: Endocrinology

## 2022-12-20 DIAGNOSIS — S41112D Laceration without foreign body of left upper arm, subsequent encounter: Secondary | ICD-10-CM | POA: Diagnosis not present

## 2022-12-20 DIAGNOSIS — E782 Mixed hyperlipidemia: Secondary | ICD-10-CM | POA: Diagnosis not present

## 2022-12-20 DIAGNOSIS — E46 Unspecified protein-calorie malnutrition: Secondary | ICD-10-CM | POA: Diagnosis not present

## 2022-12-20 DIAGNOSIS — J45909 Unspecified asthma, uncomplicated: Secondary | ICD-10-CM | POA: Diagnosis not present

## 2022-12-20 DIAGNOSIS — S51812D Laceration without foreign body of left forearm, subsequent encounter: Secondary | ICD-10-CM | POA: Diagnosis not present

## 2022-12-20 DIAGNOSIS — S51811D Laceration without foreign body of right forearm, subsequent encounter: Secondary | ICD-10-CM | POA: Diagnosis not present

## 2022-12-21 DIAGNOSIS — E46 Unspecified protein-calorie malnutrition: Secondary | ICD-10-CM | POA: Diagnosis not present

## 2022-12-21 DIAGNOSIS — S41112D Laceration without foreign body of left upper arm, subsequent encounter: Secondary | ICD-10-CM | POA: Diagnosis not present

## 2022-12-21 DIAGNOSIS — S51812D Laceration without foreign body of left forearm, subsequent encounter: Secondary | ICD-10-CM | POA: Diagnosis not present

## 2022-12-21 DIAGNOSIS — S51811D Laceration without foreign body of right forearm, subsequent encounter: Secondary | ICD-10-CM | POA: Diagnosis not present

## 2022-12-21 DIAGNOSIS — J45909 Unspecified asthma, uncomplicated: Secondary | ICD-10-CM | POA: Diagnosis not present

## 2022-12-21 DIAGNOSIS — E782 Mixed hyperlipidemia: Secondary | ICD-10-CM | POA: Diagnosis not present

## 2022-12-23 ENCOUNTER — Encounter (HOSPITAL_COMMUNITY): Payer: Self-pay | Admitting: Emergency Medicine

## 2022-12-23 ENCOUNTER — Emergency Department (HOSPITAL_COMMUNITY)
Admission: EM | Admit: 2022-12-23 | Discharge: 2022-12-23 | Disposition: A | Discharge status: Home / Self Care | Payer: Medicare Other | Attending: Emergency Medicine | Admitting: Emergency Medicine

## 2022-12-23 ENCOUNTER — Other Ambulatory Visit: Payer: Self-pay

## 2022-12-23 ENCOUNTER — Emergency Department (HOSPITAL_COMMUNITY): Payer: Medicare Other

## 2022-12-23 DIAGNOSIS — M1712 Unilateral primary osteoarthritis, left knee: Secondary | ICD-10-CM | POA: Diagnosis not present

## 2022-12-23 DIAGNOSIS — Y939 Activity, unspecified: Secondary | ICD-10-CM | POA: Diagnosis not present

## 2022-12-23 DIAGNOSIS — M7052 Other bursitis of knee, left knee: Secondary | ICD-10-CM | POA: Diagnosis not present

## 2022-12-23 DIAGNOSIS — R52 Pain, unspecified: Secondary | ICD-10-CM | POA: Diagnosis not present

## 2022-12-23 DIAGNOSIS — M25562 Pain in left knee: Secondary | ICD-10-CM | POA: Diagnosis present

## 2022-12-23 DIAGNOSIS — M25462 Effusion, left knee: Secondary | ICD-10-CM | POA: Diagnosis not present

## 2022-12-23 DIAGNOSIS — Z7982 Long term (current) use of aspirin: Secondary | ICD-10-CM | POA: Insufficient documentation

## 2022-12-23 DIAGNOSIS — I10 Essential (primary) hypertension: Secondary | ICD-10-CM | POA: Diagnosis not present

## 2022-12-23 HISTORY — DX: Synovial cyst of popliteal space (Baker), unspecified knee: M71.20

## 2022-12-23 MED ORDER — OXYCODONE HCL 5 MG PO TABS
5.0000 mg | ORAL_TABLET | Freq: Once | ORAL | Status: AC
  Administered 2022-12-23: 5 mg via ORAL
  Filled 2022-12-23: qty 1

## 2022-12-23 MED ORDER — OXYCODONE-ACETAMINOPHEN 5-325 MG PO TABS: 1.0000 | ORAL_TABLET | Freq: Four times a day (QID) | ORAL | 0 refills | Status: DC | PRN

## 2022-12-23 NOTE — ED Provider Notes (Signed)
[facility] EMERGENCY DEPARTMENT Provider Note   CSN: [id number] Arrival date & time: 12/23/22  1604     History  Chief Complaint  Patient presents with   Knee Pain    Christine Leblanc is a 88 y.o. female.  HPI 88 year old female presents with left knee pain.  She was walking and all of a sudden it seemed to pop and have pain.  She had a similar thing happened a month ago.  She has recently been doing some physical therapy in the house with a home health nurse and thinks this is what caused it.  Saw Dr. Mardelle Matte and had her knee drained last month.  However this time the pain is more superior and there is no as much swelling in the knee as above the knee.  No redness, fever, etc.  She took some extra strength Tylenol earlier.  She still has about 4 leftover oxycodone though she is not really sure where they are right now.  Home Medications Prior to Admission medications   Medication Sig Start Date End Date Taking? Authorizing Provider  oxyCODONE-acetaminophen (PERCOCET/ROXICET) 5-325 MG tablet Take 1 tablet by mouth every 6 (six) hours as needed for severe pain. 12/23/22  Yes Sherwood Gambler, MD  albuterol (VENTOLIN HFA) 108 (90 Base) MCG/ACT inhaler Inhale 1-2 puffs into the lungs every 6 (six) hours as needed for wheezing or shortness of breath. Wheezing, Asthma Symptoms 06/23/22   Damita Lack, MD  aspirin EC 81 MG tablet Take 81 mg by mouth daily. Swallow whole.    [provider]  Carbinoxamine Maleate 4 MG TABS Take 1 tablet (4 mg total) by mouth every 8 (eight) hours. Patient taking differently: Take 1 tablet by mouth at bedtime. 06/09/21 12/13/22  Valentina Shaggy, MD  EPINEPHrine 0.3 mg/0.3 mL IJ SOAJ injection Inject into the muscle as needed for anaphylaxis. 08/13/18   [provider]  ferrous sulfate 325 (65 FE) MG tablet Take 325 mg by mouth daily with breakfast.    [provider]  Glucosamine-Chondroit-Vit C-Mn (GLUCOSAMINE 1500 COMPLEX PO)  Take by mouth. Takes '1500mg'$  with Chonroitin '1200mg'$  once daily arthritis pain relief- 8 hr caplet once as needed.    [provider]  ipratropium (ATROVENT) 0.06 % nasal spray USE 2 SPRAYS IN [facility] NOSTRIL THREE TIMES A DAY 07/25/22   Valentina Shaggy, MD  levothyroxine (SYNTHROID) 88 MCG tablet TAKE 1 TABLET DAILY ON MONDAY THROUGH SATURDAY AND ONE-HALF (1/2) TABLET ON SUNDAY 10/10/22   Elayne Snare, MD  omeprazole (PRILOSEC) 40 MG capsule Take 1 capsule (40 mg total) by mouth daily before breakfast. 10/11/22   Erenest Rasher, PA-C  oxyCODONE-acetaminophen (PERCOCET/ROXICET) 5-325 MG tablet Take 1 pill every 8 hours for pain not relieved by Tylenol alone 11/11/22   Milton Ferguson, MD  pravastatin (PRAVACHOL) 40 MG tablet TAKE 2 TABLETS DAILY 07/26/22   Ahmed Prima, Fransisco Hertz, PA-C  promethazine-dextromethorphan (PROMETHAZINE-DM) 6.25-15 MG/5ML syrup Take 5 mLs by mouth at bedtime as needed for cough. 01/15/22   Jaynee Eagles, PA-C  senna (SENOKOT) 8.6 MG tablet Take 2 tablets by mouth at bedtime.     [provider]      Allergies    Celecoxib, Dexilant [dexlansoprazole], and Famotidine    Review of Systems   Review of Systems  Constitutional:  Negative for fever.  Musculoskeletal:  Positive for arthralgias and joint swelling.  Skin:  Negative for color change.    Physical Exam Updated Vital Signs BP (!) 154/91 (BP  Location: Left Arm)   Pulse 88   Temp 98.2 F (36.8 C) (Oral)   Resp 17   Ht '5\' 1"'$  (1.549 m)   Wt 44.5 kg   SpO2 96%   BMI 18.52 kg/m  Physical Exam Vitals and nursing note reviewed.  Constitutional:      Appearance: She is well-developed.  HENT:     Head: Normocephalic and atraumatic.  Cardiovascular:     Rate and Rhythm: Normal rate and regular rhythm.     Pulses:          Dorsalis pedis pulses are 2+ on the left side.  Pulmonary:     Effort: Pulmonary effort is normal.  Abdominal:     General: There is no distension.     Tenderness: There is  no abdominal tenderness.  Musculoskeletal:     Left knee: Swelling (superior) present. No effusion, erythema or ecchymosis. Decreased range of motion.  Skin:    General: Skin is warm and dry.  Neurological:     Mental Status: She is alert.     ED Results / Procedures / Treatments   Labs (all labs ordered are listed, but only abnormal results are displayed) Labs Reviewed - No data to display  EKG None  Radiology DG Knee Complete 4 Views Left  Result Date: 12/23/2022 CLINICAL DATA:  Pain EXAM: LEFT KNEE - COMPLETE 4+ VIEW COMPARISON:  11/11/2022 FINDINGS: No recent fracture or dislocation is seen. Osteopenia is seen in bony structures. Degenerative changes are noted with bony spurs and chondrocalcinosis in medial, lateral and patellofemoral compartments. There is moderate sized effusion in suprapatellar bursa. Scattered vascular calcifications are seen in soft tissues. IMPRESSION: No recent fracture or dislocation is seen in left knee. Degenerative changes are noted. Moderate sized effusion is present in suprapatellar bursa. Electronically Signed   By: Elmer Picker M.D.   On: 12/23/2022 18:12    Procedures Procedures    Medications Ordered in ED Medications  oxyCODONE (Oxy IR/ROXICODONE) immediate release tablet 5 mg (5 mg Oral Given 12/23/22 2159)    ED Course/ Medical Decision Making/ A&P                           Medical Decision Making Amount and/or Complexity of Data Reviewed Independent Historian:     Details: Daughter External Data Reviewed: notes. Radiology: ordered and independent interpretation performed.    Details: No fracture/dislocation to the left knee.  Suprapatellar effusion.  Risk Prescription drug management.   Patient is no longer on anticoagulation when discussing with her and daughter.  The effusion all appears to be superior to the knee joint and the bursa.  No erythema or signs of acute inflammation otherwise with no increased warmth.  No  fevers.  I do not think emergent arthrocentesis or fluid aspiration is warranted.  Highly doubt septic joint or septic bursitis.  Will treat with pain control and Ace wrap.  Otherwise, she has follow-up with her orthopedist this upcoming week and I think she is stable for follow-up there.  Discharged home with return precautions.        Final Clinical Impression(s) / ED Diagnoses Final diagnoses:  Suprapatellar bursitis of left knee    Rx / DC Orders ED Discharge Orders          Ordered    oxyCODONE-acetaminophen (PERCOCET/ROXICET) 5-325 MG tablet  Every 6 hours PRN        12/23/22 2142  Sherwood Gambler, MD 12/23/22 2211

## 2022-12-23 NOTE — Discharge Instructions (Addendum)
It appears that the bursa above your knee is inflamed.  We are putting an Ace wrap on this for you.  You are being given oxycodone for pain but you can still take Tylenol.  Follow-up with Dr. Mardelle Matte as scheduled this upcoming week.  If you develop fever, new or worsening or uncontrolled pain, redness or warmth to the skin, or any other new/concerning symptoms then return to the ER for evaluation.

## 2022-12-23 NOTE — ED Triage Notes (Signed)
Patient brought in via EMS from home. Alert and oriented. Airway patent. Patient c/o sudden pain in left knee that radiates into right thigh that happened while walking today in house. Patient recently being treated for Bakers cyst in left knee. Patient also wants a wound/dressing change to left arm from injury/skin tear in August.

## 2022-12-25 MED FILL — Oxycodone w/ Acetaminophen Tab 5-325 MG: ORAL | Qty: 6 | Status: AC

## 2022-12-28 DIAGNOSIS — M1712 Unilateral primary osteoarthritis, left knee: Secondary | ICD-10-CM | POA: Diagnosis not present

## 2023-01-02 ENCOUNTER — Other Ambulatory Visit: Payer: Self-pay

## 2023-01-02 ENCOUNTER — Encounter: Payer: Self-pay | Admitting: Allergy & Immunology

## 2023-01-02 ENCOUNTER — Ambulatory Visit (INDEPENDENT_AMBULATORY_CARE_PROVIDER_SITE_OTHER): Payer: Medicare Other | Admitting: Allergy & Immunology

## 2023-01-02 VITALS — BP 118/62 | HR 97 | Temp 98.1°F | Resp 20 | Ht 61.0 in | Wt 97.4 lb

## 2023-01-02 DIAGNOSIS — J4540 Moderate persistent asthma, uncomplicated: Secondary | ICD-10-CM | POA: Diagnosis not present

## 2023-01-02 DIAGNOSIS — J310 Chronic rhinitis: Secondary | ICD-10-CM | POA: Diagnosis not present

## 2023-01-02 DIAGNOSIS — K219 Gastro-esophageal reflux disease without esophagitis: Secondary | ICD-10-CM | POA: Diagnosis not present

## 2023-01-02 MED ORDER — CARBINOXAMINE MALEATE 4 MG PO TABS: 1.0000 | ORAL_TABLET | Freq: Every day | ORAL | 4 refills | Status: DC

## 2023-01-02 NOTE — Patient Instructions (Addendum)
1. Chronic rhinitis - with negative testing to the entire panel - Continue Xyzal (levocetirizine) one tablet once a day.  - Continue with your nose spray.   2. Mild persistent asthma, uncomplicated  - Lung testing not done today.  - We are not going to make any changes at this time.  - Daily controller medication(s): NOTHING - Prior to physical activity: albuterol 2 puffs 10-15 minutes before physical activity. - Rescue medications: albuterol 4 puffs every 4-6 hours as needed - Asthma control goals:  * Full participation in all desired activities (may need albuterol before activity) * Albuterol use two time or less a week on average (not counting use with activity) * Cough interfering with sleep two time or less a month * Oral steroids no more than once a year * No hospitalizations  3. Return to clinic in 12 months or earlier if needed.    Please inform us of any Emergency Department visits, hospitalizations, or changes in symptoms. Call us before going to the ED for breathing or allergy symptoms since we might be able to fit you in for a sick visit. Feel free to contact us anytime with any questions, problems, or concerns.  It was a pleasure to see you and your family again today!  Websites that have reliable patient information: 1. American Academy of Asthma, Allergy, and Immunology: www.aaaai.org 2. Food Allergy Research and Education (FARE): foodallergy.org 3. Mothers of Asthmatics: http://www.asthmacommunitynetwork.org 4. American College of Allergy, Asthma, and Immunology: www.acaai.org   COVID-19 Vaccine Information can be found at: ShippingScam.co.uk For questions related to vaccine distribution or appointments, please email [email or web address] or call [phone].   We realize that you might be concerned about having an allergic reaction to the COVID19 vaccines. To help with that concern, WE ARE OFFERING THE  COVID19 VACCINES IN OUR OFFICE! Ask the front desk for dates!     "Like" [place] on Facebook and Instagram for our latest updates!      A healthy democracy works best when [employer] participate! Make sure you are registered to vote! If you have moved or changed any of your contact information, you will need to get this updated before voting!  In some cases, you MAY be able to register to vote online: [email or web address]

## 2023-01-02 NOTE — Progress Notes (Unsigned)
FOLLOW UP  Date of Service/Encounter:  01/02/23   Assessment:   Chronic rhinitis - s/p 30+ years allergen immunotherapy    Moderate persistent asthma - with marked improvement with bronchodilator challenge   GERD - on omeprazole (followed by Dr. 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 Chronic rhinitis - with negative testing to the entire panel - Continue Xyzal (levocetirizine) one tablet once a day.  - Continue with your nose spray.   2. Mild persistent asthma, uncomplicated  - Lung testing not done today.  - We are not going to make any changes at this time.  - Daily controller medication(s): NOTHING - Prior to physical activity: albuterol 2 puffs 10-15 minutes before physical activity. - Rescue medications: albuterol 4 puffs every 4-6 hours as needed - Asthma control goals:  * Full participation in all desired activities (may need albuterol before activity) * Albuterol use two time or less a week on average (not counting use with activity) * Cough interfering with sleep two time or less a month * Oral steroids no more than once a year * No hospitalizations  3. Return to clinic in 12 months or earlier if needed.   Subjective:   Christine Leblanc is a 89 y.o. female presenting today for follow up of  Chief Complaint  Patient presents with   Follow-up    Med refills    Christine Leblanc has a history of the following: Patient Active Problem List   Diagnosis Date Noted   Constipation 10/10/2022   Protein-calorie malnutrition, mild ([place]) 06/22/2022   Acute bronchitis 06/22/2022   Iron deficiency anemia 06/22/2022   COPD (chronic obstructive pulmonary disease) ([place]) 06/22/2022   Loss of weight 10/09/2021   Pharyngeal dysphagia 10/09/2021   Mild carotid artery disease ([place]) 05/19/2020   Near syncope 12/26/2018   History  of TIA (transient ischemic attack) 12/25/2018   Neck pain    Chest pain 09/30/2018   Generalized weakness 09/30/2018   Hypokalemia    MGUS (monoclonal gammopathy of unknown significance) 10/21/2017   Primary localized osteoarthrosis of right shoulder 06/21/2015   S/P shoulder replacement 06/21/2015   Dysphagia, idiopathic 11/29/2014   Osteoarthritis of right knee 07/27/2014   Postsurgical hypothyroidism 04/22/2014   Hypothyroidism 10/12/2013   Syncope and collapse    Anemia, normocytic normochromic 04/26/2012   Gastroesophageal reflux disease    Essential hypertension 12/07/2011   Leg pain, bilateral 04/27/2011   Dyslipidemia 04/27/2011   Other dysphagia 06/07/2010    History obtained from: chart review and patient and her daughter.  Christine Leblanc is a 89 y.o. female presenting for a follow up visit.  She was last seen in June 2023.  At that time, we stopped the carbinoxamine and started Xyzal 5 mg daily instead.  She continues pulmonary spray.  For her asthma, lung testing was absent.  We continue with albuterol as needed.  Since last visit, she has not done well.   She has a Baker's cyst and she is hanging a lot of pain. She is in a lot of pain today.  However, all of her ailments now are from issues other than asthma and allergies.  Asthma/Respiratory Symptom History: She has not been using her albuterol inhaler. She has it on hand to use as needed. Overall she has done well.  She has not needed prednisone.  She  has had improvement with bronchodilator in the past, but she has not really wanted to use a controller medication.  Allergic Rhinitis Symptom History: She is doing well with her carbinoxamine '4mg'$  daily. She is getting low on it and needs a refill.  She is using her nose spray every day. Once at night is good to make her feel good all days.  She has not been on antibiotics for any sinus infections.  GERD Symptom History: She has dysphagia and nausea.  She normally is on omeprazole.   She does need refills of this.  I told her we are happy to send that in for her.  Otherwise, there have been no changes to her past medical history, surgical history, family history, or social history.    Review of Systems  Constitutional: Negative.  Negative for chills, fever, malaise/fatigue and weight loss.  HENT:  Negative for congestion, ear discharge and ear pain.        Positive for mucous production.   Eyes:  Negative for pain, discharge and redness.  Respiratory:  Negative for cough, sputum production, shortness of breath and wheezing.   Cardiovascular: Negative.  Negative for chest pain and palpitations.  Gastrointestinal:  Negative for abdominal pain, constipation, diarrhea, heartburn, nausea and vomiting.  Musculoskeletal:  Positive for joint pain.  Skin: Negative.  Negative for itching and rash.  Neurological:  Positive for dizziness. Negative for headaches.  Endo/Heme/Allergies:  Negative for environmental allergies. Does not bruise/bleed easily.       Objective:   Blood pressure 118/62, pulse 97, temperature 98.1 F (36.7 C), resp. rate 20, height '5\' 1"'$  (1.549 m), weight 97 lb 6.4 oz (44.2 kg), SpO2 98 %. Body mass index is 18.4 kg/m.    Physical Exam Vitals reviewed.  Constitutional:      Appearance: She is well-developed and underweight.     Comments: Talkative.  In wheelchair.  HENT:     Head: Normocephalic and atraumatic.     Right Ear: Tympanic membrane, ear canal and external ear normal. No drainage, swelling or tenderness. Tympanic membrane is not injected, scarred, erythematous, retracted or bulging.     Left Ear: Tympanic membrane, ear canal and external ear normal. No drainage, swelling or tenderness. Tympanic membrane is not injected, scarred, erythematous, retracted or bulging.     Nose: No nasal deformity, septal deviation, mucosal edema or rhinorrhea.     Right Turbinates: Enlarged and swollen.     Left Turbinates: Enlarged and swollen.      Right Sinus: No maxillary sinus tenderness or frontal sinus tenderness.     Left Sinus: No maxillary sinus tenderness or frontal sinus tenderness.     Comments: Clear rhinorrhea.    Mouth/Throat:     Mouth: Mucous membranes are not pale and not dry.     Pharynx: Uvula midline. No pharyngeal swelling, oropharyngeal exudate, posterior oropharyngeal erythema or uvula swelling.     Tonsils: 1+ on the right. 1+ on the left.  Eyes:     General:        Right eye: No discharge.        Left eye: No discharge.     Conjunctiva/sclera: Conjunctivae normal.     Right eye: Right conjunctiva is not injected. No chemosis.    Left eye: Left conjunctiva is not injected. No chemosis.    Pupils: Pupils are equal, round, and reactive to light.  Cardiovascular:     Rate and Rhythm: Normal rate and regular rhythm.  Heart sounds: Normal heart sounds.  Pulmonary:     Effort: Pulmonary effort is normal. No tachypnea, accessory muscle usage or respiratory distress.     Breath sounds: Normal breath sounds. No wheezing, rhonchi or rales.  Chest:     Chest wall: No tenderness.  Abdominal:     Tenderness: There is no abdominal tenderness. There is no guarding or rebound.  Lymphadenopathy:     Head:     Right side of head: No submandibular, tonsillar or occipital adenopathy.     Left side of head: No submandibular, tonsillar or occipital adenopathy.     Cervical: No cervical adenopathy.  Skin:    Coloration: Skin is not pale.     Findings: No abrasion, erythema, petechiae or rash. Rash is not papular, urticarial or vesicular.  Neurological:     Mental Status: She is alert.  Psychiatric:        Behavior: Behavior is cooperative.      Diagnostic studies: none      Salvatore Marvel, MD  [facility]

## 2023-01-03 ENCOUNTER — Encounter: Payer: Self-pay | Admitting: Allergy & Immunology

## 2023-01-08 ENCOUNTER — Encounter: Payer: Medicare Other | Admitting: Hematology

## 2023-01-08 DIAGNOSIS — M1712 Unilateral primary osteoarthritis, left knee: Secondary | ICD-10-CM | POA: Diagnosis not present

## 2023-01-10 ENCOUNTER — Ambulatory Visit: Payer: Medicare Other | Admitting: Internal Medicine

## 2023-01-14 ENCOUNTER — Ambulatory Visit: Payer: Medicare Other | Admitting: General Practice

## 2023-01-16 DIAGNOSIS — T148XXD Other injury of unspecified body region, subsequent encounter: Secondary | ICD-10-CM | POA: Diagnosis not present

## 2023-01-16 DIAGNOSIS — X58XXXA Exposure to other specified factors, initial encounter: Secondary | ICD-10-CM | POA: Diagnosis not present

## 2023-01-16 DIAGNOSIS — S41102A Unspecified open wound of left upper arm, initial encounter: Secondary | ICD-10-CM | POA: Diagnosis not present

## 2023-01-18 ENCOUNTER — Telehealth: Payer: Self-pay | Admitting: Cardiology

## 2023-01-18 ENCOUNTER — Inpatient Hospital Stay: Payer: Medicare Other | Admitting: Hematology

## 2023-01-18 DIAGNOSIS — M1712 Unilateral primary osteoarthritis, left knee: Secondary | ICD-10-CM | POA: Diagnosis not present

## 2023-01-18 NOTE — Telephone Encounter (Signed)
   Pre-operative Risk Assessment    Patient Name: Christine Leblanc  DOB: Apr 26, 1929 MRN: [id number]      Request for Surgical Clearance    Procedure:   Left Total Knee Replacement   Date of Surgery:  Clearance TBD                                 Surgeon:  Marchia Bond  Surgeon's Group or Practice Name:  [facility]  Phone number:  [phone] Fax number:  [phone]  1. What type of clearance is requested?  Medical or Cardiac Clearance only?  Pharmacy Clearance Only (Request is to hold medication only)?  Or Both?  Press F2 and select the clearance requested.  If both are needed, select both from the drop down list.     :1}  Type of Clearance Requested:   - Medical    Type of Anesthesia:  Spinal   Additional requests/questions:    Sandrea Hammond   01/18/2023, 3:20 PM

## 2023-01-18 NOTE — Telephone Encounter (Signed)
Dr. Harl Bowie she recently saw patient in the clinic.  Would you be able to comment on patient's risk for potential left total knee replacement.  Thank you for your help.  Please direct your response to [facility] preop pool.  Jossie Ng. Brealynn Contino NP-C     01/18/2023, 3:38 PM [address] Office [phone] Fax [phone]

## 2023-01-22 ENCOUNTER — Telehealth: Payer: Self-pay | Admitting: *Deleted

## 2023-01-22 NOTE — Telephone Encounter (Signed)
   Name: Christine Leblanc  DOB: 01/16/1929  MRN: [id number]  Primary Cardiologist: Carlyle Dolly, MD   Preoperative team, please contact this patient and set up a phone call appointment for further preoperative risk assessment. Please obtain consent and complete medication review. Thank you for your help.  I confirm that guidance regarding antiplatelet and oral anticoagulation therapy has been completed and, if necessary, noted below (none requested).    [name], NP 01/22/2023, 11:27 AM [place]

## 2023-01-22 NOTE — Telephone Encounter (Signed)
Pt agreeable to plan of care for tele pre op appt 01/25/23 @ 3 pm. Med rec and consent are done.     Patient Consent for Virtual Visit        Christine Leblanc has provided verbal consent on 01/22/2023 for a virtual visit (video or telephone).   CONSENT FOR VIRTUAL VISIT FOR:  Christine Leblanc  By participating in this virtual visit I agree to the following:  I hereby voluntarily request, consent and authorize [facility] and its employed or contracted physicians, physician assistants, nurse practitioners or other licensed health care professionals (the Practitioner), to provide me with telemedicine health care services (the "Services") as deemed necessary by the treating Practitioner. I acknowledge and consent to receive the Services by the Practitioner via telemedicine. I understand that the telemedicine visit will involve communicating with the Practitioner through live audiovisual communication technology and the disclosure of certain medical information by electronic transmission. I acknowledge that I have been given the opportunity to request an in-person assessment or other available alternative prior to the telemedicine visit and am voluntarily participating in the telemedicine visit.  I understand that I have the right to withhold or withdraw my consent to the use of telemedicine in the course of my care at any time, without affecting my right to future care or treatment, and that the Practitioner or I may terminate the telemedicine visit at any time. I understand that I have the right to inspect all information obtained and/or recorded in the course of the telemedicine visit and may receive copies of available information for a reasonable fee.  I understand that some of the potential risks of receiving the Services via telemedicine include:  Delay or interruption in medical evaluation due to technological equipment failure or disruption; Information transmitted may not be sufficient  (e.g. poor resolution of images) to allow for appropriate medical decision making by the Practitioner; and/or  In rare instances, security protocols could fail, causing a breach of personal health information.  Furthermore, I acknowledge that it is my responsibility to provide information about my medical history, conditions and care that is complete and accurate to the best of my ability. I acknowledge that Practitioner's advice, recommendations, and/or decision may be based on factors not within their control, such as incomplete or inaccurate data provided by me or distortions of diagnostic images or specimens that may result from electronic transmissions. I understand that the practice of medicine is not an exact science and that Practitioner makes no warranties or guarantees regarding treatment outcomes. I acknowledge that a copy of this consent can be made available to me via my patient portal ([facility]), or I can request a printed copy by calling the office of [facility].    I understand that my insurance will be billed for this visit.   I have read or had this consent read to me. I understand the contents of this consent, which adequately explains the benefits and risks of the Services being provided via telemedicine.  I have been provided ample opportunity to ask questions regarding this consent and the Services and have had my questions answered to my satisfaction. I give my informed consent for the services to be provided through the use of telemedicine in my medical care

## 2023-01-22 NOTE — Telephone Encounter (Signed)
I called who I tough was the pt. The primary number is for the pt's daughter. I stated that we were needing to schedule a tele visit for pre op clearance. She asked for me to call the pt at her # of [phone] to set up tele visit.

## 2023-01-22 NOTE — Telephone Encounter (Signed)
Pt agreeable to plan of care for tele pre op appt 01/25/23 @ 3 pm. Med rec and consent are done.

## 2023-01-25 ENCOUNTER — Ambulatory Visit: Payer: Medicare Other | Attending: Cardiology | Admitting: Physician Assistant

## 2023-01-25 DIAGNOSIS — Z01810 Encounter for preprocedural cardiovascular examination: Secondary | ICD-10-CM | POA: Diagnosis not present

## 2023-01-25 NOTE — Addendum Note (Signed)
Addended by: Elgie Collard on: 01/25/2023 03:56 PM   Modules accepted: Level of Service

## 2023-01-25 NOTE — Progress Notes (Signed)
Virtual Visit via Telephone Note   Because of Christine Leblanc co-morbid illnesses, she is at least at moderate risk for complications without adequate follow up.  This format is felt to be most appropriate for this patient at this time.  The patient did not have access to video technology/had technical difficulties with video requiring transitioning to audio format only (telephone).  All issues noted in this document were discussed and addressed.  No physical exam could be performed with this format.  Please refer to the patient's chart for her consent to telehealth for [facility].  Evaluation Performed:  Preoperative cardiovascular risk assessment _____________   Date:  01/25/2023   Patient ID:  Christine Leblanc, DOB 1929/06/29, MRN [id number] Patient Location:  Home Provider location:   Office  Primary Care Provider:  Celene Squibb, MD Primary Cardiologist:  Carlyle Dolly, MD  Chief Complaint / Patient Profile   89 y.o. y/o female with a h/o HLD, hypothyroidism, palpitations (PACs and PVCs on prior monitor), stage III CKD, GERD and hiatal hernia who is pending left total knee replacement and presents today for telephonic preoperative cardiovascular risk assessment.  History of Present Illness    Christine Leblanc is a 89 y.o. female who presents via audio/video conferencing for a telehealth visit today.  Pt was last seen in cardiology clinic on 08/29/2022 by Bernerd Pho, PA-C.  At that time Christine Leblanc was doing well other than some dysphagia.  The patient is now pending procedure as outlined above. Since her last visit, she tells me that she has not had any problems. She has not had any SOB or chest pain. She is still having issues with swallowing. She fell a little bit ago and they sent a physical therapist to work on gaining strength.  When she was working with her physical therapist something terrible happened to her knee.  When it gets to hurting it hurts her all  night.  She does take pain medicine.  She is unable to walk due to her knee.  She can get around her house okay.  She does watch a lot of TV and plays board games at night.  Her daughter lives with her and her son stays with her sometimes to help.  She shares with me that she is scheduled to see a hematologist because she has a history of blood clots.  Her foot swelled up and when she took Eliquis it went back down again.  The second time it swelled up she had a Baker's cyst and they could not drain it.  It sounds like they are planning to check her blood to see if she has a blood disorder.  No cardiac symptoms at this time.   No medications indicated as needing held.  Past Medical History    Past Medical History:  Diagnosis Date   Anemia    Asthma    Baker's cyst    Cancer ([place])    skin Ca- ? basal cell    Chronic back pain    Chronic neck pain    Degenerative joint disease    Left shoulder; cervical spine, knees & hands    Gastroesophageal reflux disease    Hiatal hernia; distal esophageal web requiring dilatation; gastric polyps; gastritis; refuses colonoscopy   GERD (gastroesophageal reflux disease)    History of stress test 1990's   stress test done under the care of Dr. Lattie Haw & Dr. Gwenlyn Found, now being followed by Dr. Harl Bowie- in [place] ,  recently seen & told to f/U in one yr.    Hyperlipidemia 04/27/2011   Hypertension    Hypothyroidism    Lymphocytic thyroiditis    Mild carotid artery disease ([place])    Multiple thyroid nodules 2010   Adenomatous; thyroidectomy in 2010   Orthostasis    Osteoarthritis of right knee 07/27/2014   Pneumonia    hosp. for pneumonia- long time ago    Primary localized osteoarthrosis of right shoulder 06/21/2015   Seizures ([place])    yes- as a child- & into adult years, states she took med. for them at one time, stopped at 32 yrs. of age    Syncope and collapse 2007   Possible CVA in 2012 with left lower extremity weakness; refused  hospitalization; CT-Atrophy and chronic microvascular ischemic change.    Past Surgical History:  Procedure Laterality Date   ABDOMINAL HYSTERECTOMY     fibroids   CATARACT EXTRACTION     Bilateral; redo surgery on the right for incomplete primary procedure   COLONOSCOPY  Remote   ESOPHAGOGASTRODUODENOSCOPY  11/2009   Dr. Oneida Alar: probable distal web s/p dilation small hiatal hernia/gastric polyps/mild gastritis, appeared to have narrowing at the junction of D1 and D2 dilated up to 12 mm   ESOPHAGOGASTRODUODENOSCOPY N/A 03/21/2015   Surgeon: Danie Binder, MD; proximal esophageal web s/p dilation, multiple gastric polyps s/p multiple biopsies, mild erosive gastritis s/p biopsy.  Pathology with fundic gland polyps, chronic gastritis, negative for H. pylori.   EYE SURGERY     PROLAPSED UTERINE FIBROID LIGATION     outcomed with rectocele & cystocele   SAVORY DILATION N/A 03/21/2015   Procedure: SAVORY DILATION;  Surgeon: Danie Binder, MD;  [facility];  Service: Endoscopy;  Laterality: N/A;   SHOULDER SURGERY     left   TOTAL KNEE ARTHROPLASTY Right 07/27/2014   Procedure: RIGHT TOTAL KNEE ARTHROPLASTY;  Surgeon: Johnny Bridge, MD;  Location: [place];  Service: Orthopedics;  Laterality: Right;   TOTAL SHOULDER ARTHROPLASTY Right 06/21/2015   Procedure: RIGHT TOTAL SHOULDER ARTHROPLASTY;  Surgeon: Marchia Bond, MD;  Location: [place];  Service: Orthopedics;  Laterality: Right;   TOTAL THYROIDECTOMY  2010    Allergies  Allergies  Allergen Reactions   Celecoxib Shortness Of Breath   Dexilant [Dexlansoprazole] Anaphylaxis    abd pain   Famotidine     Makes her feel "very bad"    Home Medications    Prior to Admission medications   Medication Sig Start Date End Date Taking? Authorizing Provider  albuterol (VENTOLIN HFA) 108 (90 Base) MCG/ACT inhaler Inhale 1-2 puffs into the lungs every 6 (six) hours as needed for wheezing or shortness of breath. Wheezing, Asthma  Symptoms 06/23/22   Damita Lack, MD  aspirin EC 81 MG tablet Take 81 mg by mouth daily. Swallow whole.    [provider]  Carbinoxamine Maleate 4 MG TABS Take 1 tablet (4 mg total) by mouth at bedtime. 01/02/23 04/02/23  Valentina Shaggy, MD  EPINEPHrine 0.3 mg/0.3 mL IJ SOAJ injection Inject into the muscle as needed for anaphylaxis. 08/13/18   [provider]  ferrous sulfate 325 (65 FE) MG tablet Take 325 mg by mouth daily with breakfast.    [provider]  ipratropium (ATROVENT) 0.06 % nasal spray USE 2 SPRAYS IN [facility] NOSTRIL THREE TIMES A DAY 07/25/22   Valentina Shaggy, MD  levothyroxine (SYNTHROID) 88 MCG tablet TAKE 1 TABLET DAILY ON [place]  AND ONE-HALF (1/2) TABLET ON SUNDAY 10/10/22   Elayne Snare, MD  omeprazole (PRILOSEC) 40 MG capsule Take 1 capsule (40 mg total) by mouth daily before breakfast. 10/11/22   Erenest Rasher, PA-C  pravastatin (PRAVACHOL) 40 MG tablet TAKE 2 TABLETS DAILY 07/26/22   Ahmed Prima, Fransisco Hertz, PA-C  senna (SENOKOT) 8.6 MG tablet Take 2 tablets by mouth at bedtime.     [provider]    Physical Exam    Vital Signs:  KHADEJA ABT does not have vital signs available for review today.  Given telephonic nature of communication, physical exam is limited. AAOx3. NAD. Normal affect.  Speech and respirations are unlabored.  Accessory Clinical Findings    None  Assessment & Plan    1.  Preoperative Cardiovascular Risk Assessment:   Ms. Kraner's perioperative risk of a major cardiac event is 0.9% according to the Revised Cardiac Risk Index (RCRI).  Therefore, she is at low risk for perioperative complications.   Her functional capacity is poor at 4.06 METs according to the Duke Activity Status Index (DASI). Recommendations: According to ACC/AHA guidelines, no further cardiovascular testing needed.  The patient may proceed to surgery at acceptable risk.   Antiplatelet and/or Anticoagulation  Recommendations:  A copy of this note will be routed to requesting surgeon.  Time:   Today, I have spent 30 minutes with the patient with telehealth technology discussing medical history, symptoms, and management plan.     Elgie Collard, PA-C  01/25/2023, 11:57 AM

## 2023-01-30 NOTE — Progress Notes (Deleted)
Referring Provider: Celene Squibb, MD Primary Care Physician:  Celene Squibb, MD Primary GI Physician: Dr. Abbey Chatters  No chief complaint on file.   HPI:   Christine Leblanc is a 89 y.o. female with history of GERD, dysphagia in the setting of significant oropharyngeal dysfunction as well as esophageal web s/p dilation in 2016, gastric polyps, constipation, weight loss, presenting today for follow-up.  Last seen in our office 10/10/2022.  She had continued to lose weight.  Continued to have significant swelling issues.  Reported she had a speech therapist coming out to her home to work with her on strengthening her swallowing which was limiting her oral intake.  Only eating about 1 cup of mashed potatoes or mashed beans, cake, peanut butter cups, ice cream.  Speech therapy previously recommended patient to see a dietitian and/or to consider alternative means to nutrition such as a feeding tube, but patient declined a feeding tube.  GERD was well-controlled on omeprazole 40 mg daily.  Constipation well-controlled on Senokot.  Recommended continuing current medications.  Continue with speech therapy recommendations, 3 Ensure daily, and a new referral was placed to nutrition.    Today:   Past Medical History:  Diagnosis Date   Anemia    Asthma    Baker's cyst    Cancer ([place])    skin Ca- ? basal cell    Chronic back pain    Chronic neck pain    Degenerative joint disease    Left shoulder; cervical spine, knees & hands    Gastroesophageal reflux disease    Hiatal hernia; distal esophageal web requiring dilatation; gastric polyps; gastritis; refuses colonoscopy   GERD (gastroesophageal reflux disease)    History of stress test 1990's   stress test done under the care of Dr. Lattie Haw & Dr. Gwenlyn Found, now being followed by Dr. Harl Bowie- in [place] , recently seen & told to f/U in one yr.    Hyperlipidemia 04/27/2011   Hypertension    Hypothyroidism    Lymphocytic thyroiditis    Mild carotid  artery disease ([place])    Multiple thyroid nodules 2010   Adenomatous; thyroidectomy in 2010   Orthostasis    Osteoarthritis of right knee 07/27/2014   Pneumonia    hosp. for pneumonia- long time ago    Primary localized osteoarthrosis of right shoulder 06/21/2015   Seizures ([place])    yes- as a child- & into adult years, states she took med. for them at one time, stopped at 32 yrs. of age    Syncope and collapse 2007   Possible CVA in 2012 with left lower extremity weakness; refused hospitalization; CT-Atrophy and chronic microvascular ischemic change.     Past Surgical History:  Procedure Laterality Date   ABDOMINAL HYSTERECTOMY     fibroids   CATARACT EXTRACTION     Bilateral; redo surgery on the right for incomplete primary procedure   COLONOSCOPY  Remote   ESOPHAGOGASTRODUODENOSCOPY  11/2009   Dr. Oneida Alar: probable distal web s/p dilation small hiatal hernia/gastric polyps/mild gastritis, appeared to have narrowing at the junction of D1 and D2 dilated up to 12 mm   ESOPHAGOGASTRODUODENOSCOPY N/A 03/21/2015   Surgeon: Danie Binder, MD; proximal esophageal web s/p dilation, multiple gastric polyps s/p multiple biopsies, mild erosive gastritis s/p biopsy.  Pathology with fundic gland polyps, chronic gastritis, negative for H. pylori.   EYE SURGERY     PROLAPSED UTERINE FIBROID LIGATION     outcomed with rectocele & cystocele  SAVORY DILATION N/A 03/21/2015   Procedure: SAVORY DILATION;  Surgeon: Danie Binder, MD;  [facility];  Service: Endoscopy;  Laterality: N/A;   SHOULDER SURGERY     left   TOTAL KNEE ARTHROPLASTY Right 07/27/2014   Procedure: RIGHT TOTAL KNEE ARTHROPLASTY;  Surgeon: Johnny Bridge, MD;  Location: [place];  Service: Orthopedics;  Laterality: Right;   TOTAL SHOULDER ARTHROPLASTY Right 06/21/2015   Procedure: RIGHT TOTAL SHOULDER ARTHROPLASTY;  Surgeon: Marchia Bond, MD;  Location: [place];  Service: Orthopedics;  Laterality: Right;   TOTAL  THYROIDECTOMY  2010    Current Outpatient Medications  Medication Sig Dispense Refill   albuterol (VENTOLIN HFA) 108 (90 Base) MCG/ACT inhaler Inhale 1-2 puffs into the lungs every 6 (six) hours as needed for wheezing or shortness of breath. Wheezing, Asthma Symptoms 8 g 0   aspirin EC 81 MG tablet Take 81 mg by mouth daily. Swallow whole.     Carbinoxamine Maleate 4 MG TABS Take 1 tablet (4 mg total) by mouth at bedtime. 90 tablet 4   EPINEPHrine 0.3 mg/0.3 mL IJ SOAJ injection Inject into the muscle as needed for anaphylaxis.     ferrous sulfate 325 (65 FE) MG tablet Take 325 mg by mouth daily with breakfast.     ipratropium (ATROVENT) 0.06 % nasal spray USE 2 SPRAYS IN EACH NOSTRIL THREE TIMES A DAY 45 mL 1   levothyroxine (SYNTHROID) 88 MCG tablet TAKE 1 TABLET DAILY ON MONDAY THROUGH SATURDAY AND ONE-HALF (1/2) TABLET ON 'SUNDAY 90 tablet 3   omeprazole (PRILOSEC) 40 MG capsule Take 1 capsule (40 mg total) by mouth daily before breakfast. 90 capsule 1   pravastatin (PRAVACHOL) 40 MG tablet TAKE 2 TABLETS DAILY 180 tablet 3   senna (SENOKOT) 8.6 MG tablet Take 2 tablets by mouth at bedtime.      No current facility-administered medications for this visit.    Allergies as of 01/31/2023 - Review Complete 01/03/2023  Allergen Reaction Noted   Celecoxib Shortness Of Breath    Dexilant [dexlansoprazole] Anaphylaxis 05/04/2016   Famotidine  11/04/2018    Family History  Problem Relation Age of Onset   Heart disease Mother    Gallbladder disease Mother    Heart failure Mother    Diabetes Mother    Breast cancer Sister        30'$  years ago   Diabetes Sister    Heart attack Father    Diabetes Maternal Grandmother    Colon cancer Neg Hx    Stroke Neg Hx     Social History   Socioeconomic History   Marital status: Widowed    Spouse name: Not on file   Number of children: 4   Years of education: 81   Highest education level: Not on file  Occupational History   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 Her daughter lives with her.  Caffeine use: 1 cup coffee per day    Social Determinants of Health   Financial Resource Strain: Not on file  Food Insecurity: Not on file  Transportation Needs: Not on file  Physical Activity: Not on file  Stress: Not on file  Social Connections: Not on file    Review of Systems: Gen: Denies fever, chills, anorexia. Denies fatigue, weakness, weight loss.  CV: Denies chest pain, palpitations, syncope, peripheral edema, and claudication. Resp: Denies dyspnea at rest, cough, wheezing, coughing up blood, and pleurisy. GI: Denies vomiting blood, jaundice, and fecal incontinence.   Denies dysphagia or odynophagia. Derm: Denies rash, itching, dry skin Psych: Denies depression, anxiety, memory loss, confusion. No homicidal or suicidal ideation.  Heme: Denies bruising, bleeding, and enlarged lymph nodes.  Physical Exam: There were no vitals taken for this visit. General:   Alert and oriented. No distress noted. Pleasant and cooperative.  Head:  Normocephalic and atraumatic. Eyes:  Conjuctiva clear without scleral icterus. Heart:  S1, S2 present without murmurs appreciated. Lungs:  Clear to auscultation bilaterally. No wheezes, rales, or rhonchi. No distress.  Abdomen:  +BS, soft, non-tender and non-distended. No rebound or  guarding. No HSM or masses noted. Msk:  Symmetrical without gross deformities. Normal posture. Extremities:  Without edema. Neurologic:  Alert and  oriented x4 Psych:  Normal mood and affect.    Assessment:     Plan:  ***   Aliene Altes, PA-C [facility] 01/31/2023

## 2023-01-31 ENCOUNTER — Ambulatory Visit: Payer: Medicare Other | Admitting: Gastroenterology

## 2023-02-06 ENCOUNTER — Other Ambulatory Visit: Payer: Self-pay

## 2023-02-06 ENCOUNTER — Other Ambulatory Visit: Payer: Self-pay | Admitting: Endocrinology

## 2023-02-06 DIAGNOSIS — E890 Postprocedural hypothyroidism: Secondary | ICD-10-CM

## 2023-02-07 ENCOUNTER — Other Ambulatory Visit: Payer: Self-pay

## 2023-02-11 ENCOUNTER — Telehealth: Payer: Medicare Other | Admitting: Endocrinology

## 2023-02-14 NOTE — Progress Notes (Signed)
[facility] [address] [place] [address]   Clinic Day:  02/15/2023  Referring physician: Celene Squibb, MD  Patient Care Team: Celene Squibb, MD as PCP - General (Internal Medicine) Harl Bowie Alphonse Guild, MD as PCP - Cardiology (Cardiology) Danie Binder, MD (Inactive) (Gastroenterology) Elsie Saas, MD (Orthopedic Surgery) Marchia Bond, MD (Orthopedic Surgery) Elayne Snare, MD as Attending Physician (Endocrinology) Harold Hedge, Darrick Grinder, MD as Consulting Physician (Allergy and Immunology) Eloise Harman, DO as Consulting Physician (Internal Medicine) Celene Squibb, MD (Internal Medicine) Derek Jack, MD as Medical Oncologist (Hematology)   ASSESSMENT & PLAN:   Assessment:  1.  Normocytic anemia: - Mild to moderate normocytic anemia since 2019. - History of CKD since 2019. - No prior history of blood transfusion.  Has been on oral iron therapy for more than a year.  Denies any bleeding per rectum.  She has dark stools from iron supplements.  No ice pica reported.  2.  Resolved chronic DVT: - Left leg Doppler (11/01/2022): Chronic appearing DVT in the left profundofemoral vein. - She used Eliquis for 10 days. - Left leg Doppler (11/12/2022): No evidence of femoral-popliteal DVT or superficial phlebitis. - Repeat left leg Doppler (12/07/2022): No evidence of DVT.  Left popliteal fossa Baker's cyst.  3.  Social/family history: - Lives at home with her disabled daughter.  Activities are limited by left knee pain from arthritis.  Uses walker for ambulation.  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  Quit smoking 50 years ago. - Sister had breast cancer.   Plan:  1.  Normocytic anemia: - We will check her CBC today along with ferritin, iron panel, [id number], folic acid, MMA, copper, LDH and reticulocyte count. - Most likely anemia from CKD and relative iron deficiency. - She is planning to have left knee replacement done by Dr.  Mardelle Matte. - We will optimize her hemoglobin prior to surgery.  RTC 1 week for follow-up.  2.  Monoclonal gammopathy: - She was previously seen by hematology for monoclonal gammopathy.  She was lost to follow-up. - Will check SPEP, free light chains and immunofixation.   Orders Placed This Encounter  Procedures   CBC with Differential    Standing Status:   Future    Standing Expiration Date:   02/15/2024   Reticulocytes    Standing Status:   Future    Standing Expiration Date:   02/15/2024   Lactate dehydrogenase    Standing Status:   Future    Standing Expiration Date:   02/15/2024   Protein electrophoresis, serum    Standing Status:   Future    Standing Expiration Date:   02/15/2024   Kappa/lambda light chains    Standing Status:   Future    Standing Expiration Date:   02/15/2024   Immunofixation electrophoresis    Standing Status:   Future    Standing Expiration Date:   02/15/2024   Ferritin    Standing Status:   Future    Standing Expiration Date:   02/15/2024   Iron and TIBC ([facility] ONLY)    Standing Status:   Future    Standing Expiration Date:   02/16/2024   Vitamin B12    Standing Status:   Future    Standing Expiration Date:   02/15/2024   Folate    Standing Status:   Future    Standing Expiration Date:   02/15/2024   Methylmalonic acid, serum  Standing Status:   Future    Standing Expiration Date:   02/15/2024   Copper, serum    Standing Status:   Future    Standing Expiration Date:   02/15/2024      Beverly Gust Oliver,acting as a scribe for Derek Jack, MD.,have documented all relevant documentation on the behalf of Derek Jack, MD,as directed by  Derek Jack, MD while in the presence of Derek Jack, MD.   I, Derek Jack MD, have reviewed the above documentation for accuracy and completeness, and I agree with the above.   Doyce Loose   2/16/20241:21 PM  CHIEF COMPLAINT/PURPOSE OF CONSULT:    Diagnosis: DVT and anemia Current Therapy:  working up  HISTORY OF PRESENT ILLNESS:   Christine Leblanc is a 89 y.o. female presenting to clinic today for evaluation of DVT and anemia at the request of Dr.John Nevada Crane.  From 11/01/2022-11/12/2022 she took Eliquis to help with DVT.  Today, she states that she is doing well overall. Her appetite level is at 100%. Her energy level is at 30%. She needs total knee replacement but is not recommenced to get it.She can not do daily activities because of left knee pain. She uses a walker to go to the bathroom.   She denies blood in stool but dark stool is present. She denies cravings for ice. She denies having blood transfusion. She currently has been taking iron pills for the past year or more.    She has difficulty swallowing recently.   Her sister had breast cancer.   She quit smoking 28yr ago.  She lives with her disabled daughter.   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   PAST MEDICAL HISTORY:   Past Medical History: Past Medical History:  Diagnosis Date   Anemia    Asthma    Baker's cyst    [place]    skin Ca- ? basal cell    Chronic back pain    Chronic neck pain    Degenerative joint disease    Left shoulder; cervical spine, knees & hands    Gastroesophageal reflux disease    Hiatal hernia; distal esophageal web requiring dilatation; gastric polyps; gastritis; refuses colonoscopy   GERD (gastroesophageal reflux disease)    History of stress test 1990's   stress test done under the care of Dr. RLattie Haw& Dr. BGwenlyn Found now [place], recently seen & told to f/U in one yr.    Hyperlipidemia 04/27/2011   Hypertension    Hypothyroidism    Lymphocytic thyroiditis    Mild carotid artery disease ([place]    Multiple thyroid nodules 2010   Adenomatous; thyroidectomy in 2010   Orthostasis    Osteoarthritis of right knee 07/27/2014   Pneumonia    hosp. for pneumonia- long time  ago    Primary localized osteoarthrosis of right shoulder 06/21/2015   Seizures ([place]    yes- as a child- & into adult years, states she took med. for them at one time, stopped at 32 yrs. of age    Syncope and collapse 2007   Possible CVA in 2012 with left lower extremity weakness; refused hospitalization; CT-Atrophy and chronic microvascular ischemic change.     Surgical History: Past Surgical History:  Procedure Laterality Date   ABDOMINAL HYSTERECTOMY     fibroids   CATARACT EXTRACTION     Bilateral; redo surgery on the right for incomplete primary procedure   COLONOSCOPY  Remote   ESOPHAGOGASTRODUODENOSCOPY  11/2009   Dr. Oneida Alar: probable distal web s/p dilation small hiatal hernia/gastric polyps/mild gastritis, appeared to have narrowing at the junction of D1 and D2 dilated up to 12 mm   ESOPHAGOGASTRODUODENOSCOPY N/A 03/21/2015   Surgeon: Danie Binder, MD; proximal esophageal web s/p dilation, multiple gastric polyps s/p multiple biopsies, mild erosive gastritis s/p biopsy.  Pathology with fundic gland polyps, chronic gastritis, negative for H. pylori.   EYE SURGERY     PROLAPSED UTERINE FIBROID LIGATION     outcomed with rectocele & cystocele   SAVORY DILATION N/A 03/21/2015   Procedure: SAVORY DILATION;  Surgeon: Danie Binder, MD;  [facility];  Service: Endoscopy;  Laterality: N/A;   SHOULDER SURGERY     left   TOTAL KNEE ARTHROPLASTY Right 07/27/2014   Procedure: RIGHT TOTAL KNEE ARTHROPLASTY;  Surgeon: Johnny Bridge, MD;  Location: [facility];  Service: Orthopedics;  Laterality: Right;   TOTAL SHOULDER ARTHROPLASTY Right 06/21/2015   Procedure: RIGHT TOTAL SHOULDER ARTHROPLASTY;  Surgeon: Marchia Bond, MD;  Location: [facility];  Service: Orthopedics;  Laterality: Right;   TOTAL THYROIDECTOMY  2010    Social History: Social History   Socioeconomic History   Marital status: Widowed    Spouse name: Not on file   Number of children: 4   Years of education: 63    Highest education level: Not on file  Occupational History   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 Her daughter lives with her.  Caffeine use: 1 cup coffee per day    Social Determinants of Health   Financial Resource Strain: Not on file  Food Insecurity: Not on file  Transportation Needs: Not on file  Physical Activity: Not on file  Stress: Not on file  Social Connections: Not on file  Intimate Partner Violence: Not on file    Family History: Family History  Problem Relation Age of Onset   Heart disease Mother    Gallbladder disease Mother    Heart failure Mother    Diabetes Mother    Breast cancer Sister        74 years ago   Diabetes Sister    Heart attack Father    Diabetes Maternal Grandmother    Colon cancer Neg Hx    Stroke Neg Hx     Current Medications:  Current Outpatient Medications:    albuterol (VENTOLIN HFA) 108 (90 Base) MCG/ACT inhaler, Inhale 1-2 puffs into the lungs every 6 (six) hours as needed for wheezing or shortness of breath. Wheezing, Asthma Symptoms, Disp: 8 g, Rfl: 0   aspirin EC 81 MG tablet, Take 81 mg by mouth daily. Swallow whole., Disp: ,  Rfl:    Carbinoxamine Maleate 4 MG TABS, Take 1 tablet (4 mg total) by mouth at bedtime., Disp: 90 tablet, Rfl: 4   chlorhexidine (PERIDEX) 0.12 % solution, 15 mLs 2 (two) times daily., Disp: , Rfl:     ferrous sulfate 325 (65 FE) MG tablet, Take 325 mg by mouth daily with breakfast., Disp: , Rfl:    HYDROcodone-acetaminophen (NORCO/VICODIN) 5-325 MG tablet, Take one tablet by mouth evyer 8 hours as needed for pain, Disp: , Rfl:    ipratropium (ATROVENT) 0.06 % nasal spray, USE 2 SPRAYS IN EACH NOSTRIL THREE TIMES A DAY, Disp: 45 mL, Rfl: 1   levocetirizine (XYZAL) 5 MG tablet, Take one tablet by mouth daily as needed for allergies, Disp: , Rfl:    levothyroxine (SYNTHROID) 88 MCG tablet, TAKE 1 TABLET DAILY ON MONDAY THROUGH SATURDAY AND ONE-HALF (1/2) TABLET ON SUNDAY, Disp: 90 tablet, Rfl: 3   omeprazole (PRILOSEC) 40 MG capsule, Take 1 capsule (40 mg total) by mouth daily before breakfast., Disp: 90 capsule, Rfl: 1   pravastatin (PRAVACHOL) 40 MG tablet, TAKE 2 TABLETS DAILY, Disp: 180 tablet, Rfl: 3   senna (SENOKOT) 8.6 MG tablet, Take 2.5 tablets by mouth at bedtime., Disp: , Rfl:    Allergies: Allergies  Allergen Reactions   Celecoxib Shortness Of Breath   Dexlansoprazole Anaphylaxis and Other (See Comments)    abd pain   Famotidine Other (See Comments)    Makes her feel "very bad"    REVIEW OF SYSTEMS:   Review of Systems  Constitutional:  Negative for chills, fatigue and fever.  HENT:   Negative for lump/mass, mouth sores, nosebleeds, sore throat and trouble swallowing.   Eyes:  Negative for eye problems.  Respiratory:  Positive for cough. Negative for shortness of breath.   Cardiovascular:  Negative for chest pain, leg swelling and palpitations.  Gastrointestinal:  Negative for abdominal pain, constipation, diarrhea, nausea and vomiting.  Genitourinary:  Negative for bladder incontinence, difficulty urinating, dysuria, frequency, hematuria and nocturia.   Musculoskeletal:  Negative for arthralgias, back pain, flank pain, myalgias and neck pain.  Skin:  Negative for itching and rash.  Neurological:  Negative for dizziness, headaches and numbness.  Hematological:  Does  not bruise/bleed easily.  Psychiatric/Behavioral:  Negative for depression, sleep disturbance and suicidal ideas. The patient is not nervous/anxious.   All other systems reviewed and are negative.    VITALS:   Blood pressure 124/67, pulse 85, resp. rate 16, height 5' 1"$  (1.549 m), weight 96 lb 1.9 oz (43.6 kg), SpO2 99 %.  Wt Readings from Last 3 Encounters:  02/15/23 96 lb 1.9 oz (43.6 kg)  01/02/23 97 lb 6.4 oz (44.2 kg)  12/23/22 98 lb (44.5 kg)    Body mass index is 18.16 kg/m.   PHYSICAL EXAM:   Physical Exam Vitals and nursing note reviewed. Exam conducted with a chaperone present.  Constitutional:      Appearance: Normal appearance.  Cardiovascular:     Rate and Rhythm: Normal rate and regular rhythm.     Pulses: Normal pulses.     Heart sounds: Normal heart sounds.  Pulmonary:     Effort: Pulmonary effort is normal.     Breath sounds: Normal breath sounds.  Abdominal:     Palpations: Abdomen is soft. There is no hepatomegaly, splenomegaly or mass.     Tenderness: There is no abdominal tenderness.  Musculoskeletal:     Right lower leg: No edema.     Left lower  leg: No edema.  Lymphadenopathy:     Cervical: No cervical adenopathy.     Right cervical: No superficial, deep or posterior cervical adenopathy.    Left cervical: No superficial, deep or posterior cervical adenopathy.     Upper Body:     Right upper body: No supraclavicular or axillary adenopathy.     Left upper body: No supraclavicular or axillary adenopathy.  Neurological:     General: No focal deficit present.     Mental Status: She is alert and oriented to person, place, and time.  Psychiatric:        Mood and Affect: Mood normal.        Behavior: Behavior normal.     LABS:      Latest Ref Rng & Units 06/22/2022    1:53 PM 06/22/2022    5:24 AM 06/21/2022    9:20 PM  CBC  WBC 4.0 - 10.5 K/uL 11.6  13.3  17.6   Hemoglobin 12.0 - 15.0 g/dL 9.2  9.0  10.1   Hematocrit 36.0 - 46.0 % 28.6  28.7   31.1   Platelets 150 - 400 K/uL 167  158  201       Latest Ref Rng & Units 06/23/2022    4:54 AM 06/22/2022    5:24 AM 06/21/2022    9:20 PM  CMP  Glucose 70 - 99 mg/dL 91  104  121   BUN 8 - 23 mg/dL 19  25  26   $ Creatinine 0.44 - 1.00 mg/dL 0.86  1.02  1.03   Sodium 135 - 145 mmol/L 139  137  134   Potassium 3.5 - 5.1 mmol/L 3.6  3.6  3.9   Chloride 98 - 111 mmol/L 112  106  102   CO2 22 - 32 mmol/L 20  25  24   $ Calcium 8.9 - 10.3 mg/dL 8.4  8.4  8.8   Total Protein 6.5 - 8.1 g/dL  6.1  7.3   Total Bilirubin 0.3 - 1.2 mg/dL  0.1  0.5   Alkaline Phos 38 - 126 U/L  45  58   AST 15 - 41 U/L  18  18   ALT 0 - 44 U/L  11  12      No results found for: "CEA1", "CEA" / No results found for: "CEA1", "CEA" No results found for: "PSA1" No results found for: "CAN199" No results found for: "CAN125"  Lab Results  Component Value Date   TOTALPROTELP 6.5 12/05/2020   ALBUMINELP 3.4 12/05/2020   A1GS 0.2 12/05/2020   A2GS 0.8 12/05/2020   BETS 0.8 12/05/2020   GAMS 1.2 12/05/2020   MSPIKE 0.2 (H) 12/05/2020   SPEI Comment 12/05/2020   Lab Results  Component Value Date   TIBC 272 02/13/2010   FERRITIN 349 12/11/2019   FERRITIN 143 12/02/2018   FERRITIN 117 05/01/2016   IRONPCTSAT 43 02/13/2010   Lab Results  Component Value Date   LDH 130 12/05/2020   LDH 151 11/30/2019   LDH 134 12/02/2018     STUDIES:   No results found.

## 2023-02-15 ENCOUNTER — Other Ambulatory Visit (HOSPITAL_COMMUNITY)
Admission: RE | Admit: 2023-02-15 | Discharge: 2023-02-15 | Disposition: A | Discharge status: Home / Self Care | Payer: Medicare Other | Source: Ambulatory Visit | Attending: Endocrinology | Admitting: Endocrinology

## 2023-02-15 ENCOUNTER — Inpatient Hospital Stay: Payer: Medicare Other

## 2023-02-15 ENCOUNTER — Encounter: Payer: Self-pay | Admitting: Hematology

## 2023-02-15 ENCOUNTER — Inpatient Hospital Stay: Payer: Medicare Other | Attending: Hematology | Admitting: Hematology

## 2023-02-15 VITALS — BP 124/67 | HR 85 | Resp 16 | Ht 61.0 in | Wt 96.1 lb

## 2023-02-15 DIAGNOSIS — D472 Monoclonal gammopathy: Secondary | ICD-10-CM | POA: Diagnosis not present

## 2023-02-15 DIAGNOSIS — D649 Anemia, unspecified: Secondary | ICD-10-CM

## 2023-02-15 DIAGNOSIS — Z86718 Personal history of other venous thrombosis and embolism: Secondary | ICD-10-CM | POA: Diagnosis not present

## 2023-02-15 DIAGNOSIS — M1712 Unilateral primary osteoarthritis, left knee: Secondary | ICD-10-CM | POA: Insufficient documentation

## 2023-02-15 DIAGNOSIS — Z803 Family history of malignant neoplasm of breast: Secondary | ICD-10-CM | POA: Diagnosis not present

## 2023-02-15 LAB — VITAMIN B12: Vitamin B-12: 495 pg/mL (ref 180–914)

## 2023-02-15 LAB — CBC WITH DIFFERENTIAL/PLATELET
Abs Immature Granulocytes: 0.02 10*3/uL (ref 0.00–0.07)
Basophils Absolute: 0.1 10*3/uL (ref 0.0–0.1)
Basophils Relative: 1 %
Eosinophils Absolute: 0.7 10*3/uL — ABNORMAL HIGH (ref 0.0–0.5)
Eosinophils Relative: 9 %
HCT: 32.7 % — ABNORMAL LOW (ref 36.0–46.0)
Hemoglobin: 10.2 g/dL — ABNORMAL LOW (ref 12.0–15.0)
Immature Granulocytes: 0 %
Lymphocytes Relative: 13 %
Lymphs Abs: 0.9 10*3/uL (ref 0.7–4.0)
MCH: 30.3 pg (ref 26.0–34.0)
MCHC: 31.2 g/dL (ref 30.0–36.0)
MCV: 97 fL (ref 80.0–100.0)
Monocytes Absolute: 0.6 10*3/uL (ref 0.1–1.0)
Monocytes Relative: 8 %
Neutro Abs: 5.1 10*3/uL (ref 1.7–7.7)
Neutrophils Relative %: 69 %
Platelets: 177 10*3/uL (ref 150–400)
RBC: 3.37 MIL/uL — ABNORMAL LOW (ref 3.87–5.11)
RDW: 13.7 % (ref 11.5–15.5)
WBC: 7.4 10*3/uL (ref 4.0–10.5)
nRBC: 0 % (ref 0.0–0.2)

## 2023-02-15 LAB — IRON AND TIBC
Iron: 48 ug/dL (ref 28–170)
Saturation Ratios: 19 % (ref 10.4–31.8)
TIBC: 251 ug/dL (ref 250–450)
UIBC: 203 ug/dL

## 2023-02-15 LAB — FOLATE: Folate: 12.6 ng/mL (ref >5.9)

## 2023-02-15 LAB — FERRITIN: Ferritin: 228 ng/mL (ref 11–307)

## 2023-02-15 LAB — TSH: TSH: 3.897 u[IU]/mL (ref 0.350–4.500)

## 2023-02-15 LAB — RETICULOCYTES
Immature Retic Fract: 13.2 % (ref 2.3–15.9)
RBC.: 3.35 MIL/uL — ABNORMAL LOW (ref 3.87–5.11)
Retic Count, Absolute: 34.2 K/uL (ref 19.0–186.0)
Retic Ct Pct: 1 % (ref 0.4–3.1)

## 2023-02-15 LAB — LACTATE DEHYDROGENASE: LDH: 137 U/L (ref 98–192)

## 2023-02-15 LAB — T4, FREE: Free T4: 1.43 ng/dL — ABNORMAL HIGH (ref 0.61–1.12)

## 2023-02-15 NOTE — Patient Instructions (Addendum)
Christine Leblanc  Discharge Instructions  You were seen and examined today by Dr. Delton Coombes. Dr. Delton Coombes is a hematologist, meaning that he specializes in blood abnormalities. Dr. Delton Coombes discussed your past medical history, family history of cancers/blood conditions and the events that led to you being here today.  You were referred to Dr. Delton Coombes due to anemia and a possible DVT (blood clot).  Dr. Delton Coombes has recommended additional lab work today for further evaluation of your anemia.  Follow-up as scheduled.  Thank you for choosing [facility] to provide your oncology and hematology care.   To afford each patient quality time with our provider, please arrive at least 15 minutes before your scheduled appointment time. You may need to reschedule your appointment if you arrive late (10 or more minutes). Arriving late affects you and other patients whose appointments are after yours.  Also, if you miss three or more appointments without notifying the office, you may be dismissed from the clinic at the provider's discretion.    Again, thank you for choosing [facility].  Our hope is that these requests will decrease the amount of time that you wait before being seen by our physicians.   If you have a lab appointment with the [facility] please come in thru the Main Entrance and check in at the main information desk.           _____________________________________________________________  Should you have questions after your visit to [facility], please contact our office at [phone] and follow the prompts.  Our office hours are 8:00 a.m. to 4:30 p.m. Monday - Thursday and 8:00 a.m. to 2:30 p.m. Friday.  Please note that voicemails left after 4:00 p.m. may not be returned until the following business day.  We are closed weekends and all major holidays.  You do have access to a nurse 24-7, just call the  main number to the clinic [phone] and do not press any options, hold on the line and a nurse will answer the phone.    For prescription refill requests, have your pharmacy contact our office and allow 72 hours.    Masks are optional in the cancer centers. If you would like for your care team to wear a mask while they are taking care of you, please let them know. You may have one support person who is at least 87 years old accompany you for your appointments.

## 2023-02-18 ENCOUNTER — Ambulatory Visit (INDEPENDENT_AMBULATORY_CARE_PROVIDER_SITE_OTHER): Payer: Medicare Other | Admitting: Endocrinology

## 2023-02-18 ENCOUNTER — Encounter: Payer: Self-pay | Admitting: Endocrinology

## 2023-02-18 VITALS — BP 120/60 | HR 82 | Ht 61.0 in | Wt 96.6 lb

## 2023-02-18 DIAGNOSIS — E890 Postprocedural hypothyroidism: Secondary | ICD-10-CM

## 2023-02-18 LAB — KAPPA/LAMBDA LIGHT CHAINS
Kappa free light chain: 63.9 mg/L — ABNORMAL HIGH (ref 3.3–19.4)
Kappa, lambda light chain ratio: 2.24 — ABNORMAL HIGH (ref 0.26–1.65)
Lambda free light chains: 28.5 mg/L — ABNORMAL HIGH (ref 5.7–26.3)

## 2023-02-18 NOTE — Progress Notes (Signed)
Christine Leblanc 89 y.o.    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  She has been followed every 6-12 months for her thyroid supplementation and monitoring of her levels  She was last seen in 01/2022 Her levothyroxine dose has been fluctuating between 75 and 100 mcg For some time however has been taking 88 mcg   She had not missed any doses of levothyroxine in the mornings and is quite regular Not taking iron in the mornings Again usually does not have any specific symptoms of hypothyroidism  May have lost weight recently from continued difficulty with swallowing She is trying to get some protein supplements  She is still on generic levothyroxine from [facility]  As before she takes Prilosec daily in the mornings also  Labs show consistently normal TSH  Wt Readings from Last 3 Encounters:  02/18/23 96 lb 9.6 oz (43.8 kg)  02/15/23 96 lb 1.9 oz (43.6 kg)  01/02/23 97 lb 6.4 oz (44.2 kg)   Lab Results  Component Value Date   TSH 3.897 02/15/2023   TSH 1.660 06/22/2022   TSH 1.26 02/22/2022   FREET4 1.43 (H) 02/15/2023   FREET4 1.61 (H) 02/22/2022   FREET4 1.28 11/20/2021       Allergies as of 02/18/2023       Reactions   Celecoxib Shortness Of Breath   Dexlansoprazole Anaphylaxis, Other (See Comments)   abd pain   Famotidine Other (See Comments)   Makes her feel "very bad"        Medication List        Accurate as of February 18, 2023  4:50 PM. If you have any questions, ask your nurse or doctor.          albuterol 108 (90 Base) MCG/ACT inhaler Commonly known as: VENTOLIN HFA Inhale 1-2 puffs into the lungs every 6 (six) hours as needed for wheezing or shortness of breath. Wheezing, Asthma  Symptoms   aspirin EC 81 MG tablet Take 81 mg by mouth daily. Swallow whole.   Carbinoxamine Maleate 4 MG Tabs Take 1 tablet (4 mg total) by mouth at bedtime.   chlorhexidine 0.12 % solution Commonly known as: PERIDEX 15 mLs 2 (two) times daily.   ferrous sulfate 325 (65 FE) MG tablet Take 325 mg by mouth daily with breakfast.   HYDROcodone-acetaminophen 5-325 MG tablet Commonly known as: NORCO/VICODIN Take one tablet by mouth evyer 8 hours as needed for pain   ipratropium 0.06 % nasal spray Commonly known as: ATROVENT USE 2 SPRAYS IN EACH NOSTRIL THREE TIMES A DAY   levocetirizine 5 MG tablet Commonly known as: XYZAL Take one tablet by mouth daily as needed for allergies   levothyroxine 88 MCG tablet Commonly known as: SYNTHROID TAKE 1 TABLET DAILY ON MONDAY THROUGH SATURDAY AND ONE-HALF (1/2) TABLET ON SUNDAY   omeprazole 40 MG capsule Commonly known as: PRILOSEC Take 1 capsule (40 mg total) by mouth daily before breakfast.   pravastatin 40 MG tablet Commonly known as: PRAVACHOL TAKE 2 TABLETS DAILY   senna 8.6 MG tablet Commonly known as: SENOKOT Take 2.5 tablets by mouth at bedtime.        Allergies:  Allergies  Allergen Reactions   Celecoxib Shortness Of Breath  Dexlansoprazole Anaphylaxis and Other (See Comments)    abd pain   Famotidine Other (See Comments)    Makes her feel "very bad"    Past Medical History:  Diagnosis Date   Anemia    Asthma    Baker's cyst    Cancer ([place])    skin Ca- ? basal cell    Chronic back pain    Chronic neck pain    Degenerative joint disease    Left shoulder; cervical spine, knees & hands    Gastroesophageal reflux disease    Hiatal hernia; distal esophageal web requiring dilatation; gastric polyps; gastritis; refuses colonoscopy   GERD (gastroesophageal reflux disease)    History of stress test 1990's   stress test done under the care of Dr. Lattie Haw & Dr. Gwenlyn Found, now being followed by Dr. Harl Bowie- in  [place] , recently seen & told to f/U in one yr.    Hyperlipidemia 04/27/2011   Hypertension    Hypothyroidism    Lymphocytic thyroiditis    Mild carotid artery disease ([place])    Multiple thyroid nodules 2010   Adenomatous; thyroidectomy in 2010   Orthostasis    Osteoarthritis of right knee 07/27/2014   Pneumonia    hosp. for pneumonia- long time ago    Primary localized osteoarthrosis of right shoulder 06/21/2015   Seizures ([place])    yes- as a child- & into adult years, states she took med. for them at one time, stopped at 32 yrs. of age    Syncope and collapse 2007   Possible CVA in 2012 with left lower extremity weakness; refused hospitalization; CT-Atrophy and chronic microvascular ischemic change.     Past Surgical History:  Procedure Laterality Date   ABDOMINAL HYSTERECTOMY     fibroids   CATARACT EXTRACTION     Bilateral; redo surgery on the right for incomplete primary procedure   COLONOSCOPY  Remote   ESOPHAGOGASTRODUODENOSCOPY  11/2009   Dr. Oneida Alar: probable distal web s/p dilation small hiatal hernia/gastric polyps/mild gastritis, appeared to have narrowing at the junction of D1 and D2 dilated up to 12 mm   ESOPHAGOGASTRODUODENOSCOPY N/A 03/21/2015   Surgeon: Danie Binder, MD; proximal esophageal web s/p dilation, multiple gastric polyps s/p multiple biopsies, mild erosive gastritis s/p biopsy.  Pathology with fundic gland polyps, chronic gastritis, negative for H. pylori.   EYE SURGERY     PROLAPSED UTERINE FIBROID LIGATION     outcomed with rectocele & cystocele   SAVORY DILATION N/A 03/21/2015   Procedure: SAVORY DILATION;  Surgeon: Danie Binder, MD;  [facility];  Service: Endoscopy;  Laterality: N/A;   SHOULDER SURGERY     left   TOTAL KNEE ARTHROPLASTY Right 07/27/2014   Procedure: RIGHT TOTAL KNEE ARTHROPLASTY;  Surgeon: Johnny Bridge, MD;  Location: [facility];  Service: Orthopedics;  Laterality: Right;   TOTAL SHOULDER ARTHROPLASTY Right  06/21/2015   Procedure: RIGHT TOTAL SHOULDER ARTHROPLASTY;  Surgeon: Marchia Bond, MD;  Location: [facility];  Service: Orthopedics;  Laterality: Right;   TOTAL THYROIDECTOMY  2010    Family History  Problem Relation Age of Onset   Heart disease Mother    Gallbladder disease Mother    Heart failure Mother    Diabetes Mother    Breast cancer Sister        21 years ago   Diabetes Sister    Heart attack Father    Diabetes Maternal Grandmother    Colon cancer Neg Hx    Stroke  Neg Hx     Social History:  reports that she quit smoking about 44 years ago. Her smoking use included cigarettes. She has a 16.00 pack-year smoking history. She has never used smokeless tobacco. She reports current alcohol use. She reports that she does not use drugs.  REVIEW Of SYSTEMS:  ROS   Weight History:  Wt Readings from Last 3 Encounters:  02/18/23 96 lb 9.6 oz (43.8 kg)  02/15/23 96 lb 1.9 oz (43.6 kg)  01/02/23 97 lb 6.4 oz (44.2 kg)    History of hypercholesterolemia followed by PCP, treated with pravastatin 40 mg   She has chronic difficulty swallowing and is on Prilosec for reflux followed by GI      Examination:   BP 120/60 (BP Location: Right Arm, Patient Position: Sitting, Cuff Size: Normal)   Pulse 82   Ht 5' 1"$  (1.549 m)   Wt 96 lb 9.6 oz (43.8 kg)   SpO2 96%   BMI 18.25 kg/m   Patient comes in the wheelchair today  No ankle edema  She looks asthenic, has some wasting of the thenar muscles Biceps reflexes appear normal     Assessments   Hypothyroidism, postsurgical and long-standing  She has been taking 88 micrograms levothyroxine for some time now  Her weight has gone down over the last year slightly because of difficulty swallowing  She takes her levothyroxine consistently daily before breakfast TSH is quite normal and she can continue the same dose  She can contact the local endocrinologist and follow-up in 6 months   Patient Instructions  Contact Dr Dorris Fetch in  8/24   Elayne Snare 02/18/2023, 4:50 PM     Addendum: TSH normal, she will stay on 88 mcg

## 2023-02-18 NOTE — Patient Instructions (Addendum)
Contact Dr Dorris Fetch in 8/24

## 2023-02-20 LAB — METHYLMALONIC ACID, SERUM: Methylmalonic Acid, Quantitative: 205 nmol/L (ref 0–378)

## 2023-02-20 LAB — PROTEIN ELECTROPHORESIS, SERUM
A/G Ratio: 1 (ref 0.7–1.7)
Albumin ELP: 3.4 g/dL (ref 2.9–4.4)
Alpha-1-Globulin: 0.3 g/dL (ref 0.0–0.4)
Alpha-2-Globulin: 0.8 g/dL (ref 0.4–1.0)
Beta Globulin: 1 g/dL (ref 0.7–1.3)
Gamma Globulin: 1.4 g/dL (ref 0.4–1.8)
Globulin, Total: 3.4 g/dL (ref 2.2–3.9)
M-Spike, %: 0.2 g/dL — ABNORMAL HIGH
Total Protein ELP: 6.8 g/dL (ref 6.0–8.5)

## 2023-02-21 ENCOUNTER — Inpatient Hospital Stay (HOSPITAL_BASED_OUTPATIENT_CLINIC_OR_DEPARTMENT_OTHER): Payer: Medicare Other | Admitting: Hematology

## 2023-02-21 VITALS — BP 137/74 | HR 88 | Temp 97.7°F | Resp 18 | Ht 61.0 in | Wt 97.8 lb

## 2023-02-21 DIAGNOSIS — D508 Other iron deficiency anemias: Secondary | ICD-10-CM

## 2023-02-21 DIAGNOSIS — Z86718 Personal history of other venous thrombosis and embolism: Secondary | ICD-10-CM | POA: Diagnosis not present

## 2023-02-21 DIAGNOSIS — Z803 Family history of malignant neoplasm of breast: Secondary | ICD-10-CM | POA: Diagnosis not present

## 2023-02-21 DIAGNOSIS — D472 Monoclonal gammopathy: Secondary | ICD-10-CM | POA: Diagnosis not present

## 2023-02-21 DIAGNOSIS — D649 Anemia, unspecified: Secondary | ICD-10-CM | POA: Diagnosis not present

## 2023-02-21 DIAGNOSIS — M1712 Unilateral primary osteoarthritis, left knee: Secondary | ICD-10-CM | POA: Diagnosis not present

## 2023-02-21 NOTE — Progress Notes (Signed)
[facility] [address] [place] [address]    Clinic Day:  02/21/2023  Referring physician: Celene Squibb, MD  Patient Care Team: Celene Squibb, MD as PCP - General (Internal Medicine) Harl Bowie Alphonse Guild, MD as PCP - Cardiology (Cardiology) Danie Binder, MD (Inactive) (Gastroenterology) Elsie Saas, MD (Orthopedic Surgery) Marchia Bond, MD (Orthopedic Surgery) Elayne Snare, MD as Attending Physician (Endocrinology) Harold Hedge, Darrick Grinder, MD as Consulting Physician (Allergy and Immunology) Eloise Harman, DO as Consulting Physician (Internal Medicine) Celene Squibb, MD (Internal Medicine) Derek Jack, MD as Medical Oncologist (Hematology)   ASSESSMENT & PLAN:   Assessment: 1.  Normocytic anemia: - Mild to moderate normocytic anemia since 2019. - History of CKD since 2019. - No prior history of blood transfusion.  Has been on oral iron therapy for more than a year.  Denies any bleeding per rectum.  She has dark stools from iron supplements.  No ice pica reported.   2.  Resolved chronic DVT: - Left leg Doppler (11/01/2022): Chronic appearing DVT in the left profundofemoral vein. - She used Eliquis for 10 days. - Left leg Doppler (11/12/2022): No evidence of femoral-popliteal DVT or superficial phlebitis. - Repeat left leg Doppler (12/07/2022): No evidence of DVT.  Left popliteal fossa Baker's cyst.   3.  Social/family history: - Lives at home with her disabled daughter.  Activities are limited by left knee pain from arthritis.  Uses walker for ambulation.  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  Quit smoking 50 years ago. - Sister had breast cancer.  Plan: 1.  Normocytic anemia: - She is planning to have left knee replacement by Dr. Mardelle Matte. - Anemia from CKD and relative iron deficiency. - Reviewed labs from 02/15/2023.  Ferritin 228, percent saturation 19.  [id number], folic acid and MMA are normal.  LDH is normal.   Hemoglobin is 10.2 and improved. - Recommend 1 infusion of Feraheme.  Will give premeds due to prior history of drug allergies.  May decrease oral iron therapy to Monday Wednesday and Friday.  RTC 3 months with repeat CBC, ferritin and iron panel.   2.  Monoclonal gammopathy: - M spike is 0.2 g.  Kappa light chains are 63 and lambda light chains 28 with ratio of 2.24. - Immunofixation is pending.  Orders Placed This Encounter  Procedures   CBC with Differential    Standing Status:   Future    Standing Expiration Date:   02/21/2024   Ferritin    Standing Status:   Future    Standing Expiration Date:   02/21/2024   Iron and TIBC ([facility] ONLY)    Standing Status:   Future    Standing Expiration Date:   02/22/2024      Kaleen Odea as a scribe for Derek Jack, MD.,have documented all relevant documentation on the behalf of Derek Jack, MD,as directed by  Derek Jack, MD while in the presence of Derek Jack, MD.   I, Derek Jack MD, have reviewed the above documentation for accuracy and completeness, and I agree with the above.   Doyce Loose   2/22/20243:50 PM  CHIEF COMPLAINT:   Diagnosis:  DVT and anemia    Cancer Staging  No matching staging information was found for the patient.   Prior Therapy: None  Current Therapy: Feraheme and oral iron   HISTORY OF PRESENT ILLNESS:   Oncology History   No history exists.  INTERVAL HISTORY:   Bryden is a 89 y.o. female presenting to clinic today for follow up of  DVT and anemia . She was last seen by me on 02/15/2023.  Today, she states that she is doing well overall. Her appetite level is at 50%. Her energy level is at 60%.She has 6/10 left knee and back pain. She is currently on iron pills and B12.     PAST MEDICAL HISTORY:   Past Medical History: Past Medical History:  Diagnosis Date   Anemia    Asthma    Baker's cyst    Cancer ([place])     skin Ca- ? basal cell    Chronic back pain    Chronic neck pain    Degenerative joint disease    Left shoulder; cervical spine, knees & hands    Gastroesophageal reflux disease    Hiatal hernia; distal esophageal web requiring dilatation; gastric polyps; gastritis; refuses colonoscopy   GERD (gastroesophageal reflux disease)    History of stress test 1990's   stress test done under the care of Dr. Lattie Haw & Dr. Gwenlyn Found, now being followed by Dr. Harl Bowie- in [place] , recently seen & told to f/U in one yr.    Hyperlipidemia 04/27/2011   Hypertension    Hypothyroidism    Lymphocytic thyroiditis    Mild carotid artery disease ([place])    Multiple thyroid nodules 2010   Adenomatous; thyroidectomy in 2010   Orthostasis    Osteoarthritis of right knee 07/27/2014   Pneumonia    hosp. for pneumonia- long time ago    Primary localized osteoarthrosis of right shoulder 06/21/2015   Seizures ([place])    yes- as a child- & into adult years, states she took med. for them at one time, stopped at 32 yrs. of age    Syncope and collapse 2007   Possible CVA in 2012 with left lower extremity weakness; refused hospitalization; CT-Atrophy and chronic microvascular ischemic change.     Surgical History: Past Surgical History:  Procedure Laterality Date   ABDOMINAL HYSTERECTOMY     fibroids   CATARACT EXTRACTION     Bilateral; redo surgery on the right for incomplete primary procedure   COLONOSCOPY  Remote   ESOPHAGOGASTRODUODENOSCOPY  11/2009   Dr. Oneida Alar: probable distal web s/p dilation small hiatal hernia/gastric polyps/mild gastritis, appeared to have narrowing at the junction of D1 and D2 dilated up to 12 mm   ESOPHAGOGASTRODUODENOSCOPY N/A 03/21/2015   Surgeon: Danie Binder, MD; proximal esophageal web s/p dilation, multiple gastric polyps s/p multiple biopsies, mild erosive gastritis s/p biopsy.  Pathology with fundic gland polyps, chronic gastritis, negative for H. pylori.   EYE SURGERY      PROLAPSED UTERINE FIBROID LIGATION     outcomed with rectocele & cystocele   SAVORY DILATION N/A 03/21/2015   Procedure: SAVORY DILATION;  Surgeon: Danie Binder, MD;  [facility];  Service: Endoscopy;  Laterality: N/A;   SHOULDER SURGERY     left   TOTAL KNEE ARTHROPLASTY Right 07/27/2014   Procedure: RIGHT TOTAL KNEE ARTHROPLASTY;  Surgeon: Johnny Bridge, MD;  Location: [facility];  Service: Orthopedics;  Laterality: Right;   TOTAL SHOULDER ARTHROPLASTY Right 06/21/2015   Procedure: RIGHT TOTAL SHOULDER ARTHROPLASTY;  Surgeon: Marchia Bond, MD;  Location: [facility];  Service: Orthopedics;  Laterality: Right;   TOTAL THYROIDECTOMY  2010    Social History: Social History   Socioeconomic History   Marital status: Widowed    Spouse name: Not  on file   Number of children: 4   Years of education: 36   Highest education level: Not on file  Occupational History   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 Her daughter lives with her.  Caffeine use: 1 cup coffee per day    Social Determinants of Health   Financial Resource Strain: Not on file  Food Insecurity: Not on file  Transportation Needs: Not on file  Physical Activity: Not on file  Stress: Not on file  Social Connections: Not on file  Intimate Partner Violence: Not on file    Family History: Family History  Problem  Relation Age of Onset   Heart disease Mother    Gallbladder disease Mother    Heart failure Mother    Diabetes Mother    Breast cancer Sister        67 years ago   Diabetes Sister    Heart attack Father    Diabetes Maternal Grandmother    Colon cancer Neg Hx    Stroke Neg Hx     Current Medications:  Current Outpatient Medications:    albuterol (VENTOLIN HFA) 108 (90 Base) MCG/ACT inhaler, Inhale 1-2 puffs into the lungs every 6 (six) hours as needed for wheezing or shortness of breath. Wheezing, Asthma Symptoms, Disp: 8 g, Rfl: 0   aspirin EC 81 MG tablet, Take 81 mg by mouth daily. Swallow whole., Disp: , Rfl:    Carbinoxamine Maleate 4 MG TABS, Take 1 tablet (4 mg total) by mouth at bedtime., Disp: 90 tablet, Rfl: 4   chlorhexidine (PERIDEX) 0.12 % solution, 15 mLs 2 (two) times daily., Disp: , Rfl:    ferrous sulfate 325 (65 FE) MG tablet, Take 325 mg by mouth daily with breakfast., Disp: , Rfl:    HYDROcodone-acetaminophen (NORCO/VICODIN) 5-325 MG tablet, Take one tablet by mouth evyer 8 hours as needed for pain, Disp: , Rfl:    ipratropium (ATROVENT) 0.06 % nasal spray, USE 2 SPRAYS IN EACH NOSTRIL THREE TIMES A DAY, Disp: 45 mL, Rfl: 1   levocetirizine (XYZAL) 5 MG tablet, Take one tablet by mouth daily as needed for allergies, Disp: , Rfl:    levothyroxine (SYNTHROID) 88 MCG tablet, TAKE 1 TABLET DAILY ON MONDAY THROUGH SATURDAY AND ONE-HALF (1/2) TABLET ON SUNDAY, Disp: 90 tablet, Rfl: 3   omeprazole (PRILOSEC) 40 MG capsule, Take 1 capsule (40 mg total) by mouth daily before breakfast., Disp: 90 capsule, Rfl: 1   pravastatin (PRAVACHOL) 40 MG tablet, TAKE 2 TABLETS DAILY, Disp: 180 tablet, Rfl: 3   senna (SENOKOT) 8.6 MG tablet, Take 2.5 tablets by mouth at bedtime., Disp: , Rfl:    Allergies: Allergies  Allergen Reactions  Celecoxib Shortness Of Breath   Dexlansoprazole Anaphylaxis and Other (See Comments)    abd pain   Famotidine Other (See Comments)    Makes her  feel "very bad"    REVIEW OF SYSTEMS:   Review of Systems  Constitutional:  Negative for chills, fatigue and fever.  HENT:   Positive for trouble swallowing. Negative for lump/mass, mouth sores, nosebleeds and sore throat.   Eyes:  Negative for eye problems.  Respiratory:  Positive for cough. Negative for shortness of breath.   Cardiovascular:  Negative for chest pain, leg swelling and palpitations.  Gastrointestinal:  Negative for abdominal pain, constipation, diarrhea, nausea and vomiting.  Genitourinary:  Negative for bladder incontinence, difficulty urinating, dysuria, frequency, hematuria and nocturia.   Musculoskeletal:  Negative for arthralgias, back pain, flank pain, myalgias and neck pain.  Skin:  Negative for itching and rash.  Neurological:  Negative for dizziness, headaches and numbness.  Hematological:  Does not bruise/bleed easily.  Psychiatric/Behavioral:  Positive for sleep disturbance (Due to pain). Negative for depression and suicidal ideas. The patient is not nervous/anxious.   All other systems reviewed and are negative.    VITALS:   Blood pressure 137/74, pulse 88, temperature 97.7 F (36.5 C), temperature source Oral, resp. rate 18, height 5' 1"$  (1.549 m), weight 97 lb 12.8 oz (44.4 kg), SpO2 98 %.  Wt Readings from Last 3 Encounters:  02/21/23 97 lb 12.8 oz (44.4 kg)  02/18/23 96 lb 9.6 oz (43.8 kg)  02/15/23 96 lb 1.9 oz (43.6 kg)    Body mass index is 18.48 kg/m.  Performance status (ECOG): 1 - Symptomatic but completely ambulatory  PHYSICAL EXAM:   Physical Exam Vitals and nursing note reviewed. Exam conducted with a chaperone present.  Constitutional:      Appearance: Normal appearance.  Cardiovascular:     Rate and Rhythm: Normal rate and regular rhythm.     Pulses: Normal pulses.     Heart sounds: Normal heart sounds.  Pulmonary:     Effort: Pulmonary effort is normal.     Breath sounds: Normal breath sounds.  Abdominal:     Palpations:  Abdomen is soft. There is no hepatomegaly, splenomegaly or mass.     Tenderness: There is no abdominal tenderness.  Musculoskeletal:     Right lower leg: No edema.     Left lower leg: No edema.  Lymphadenopathy:     Cervical: No cervical adenopathy.     Right cervical: No superficial, deep or posterior cervical adenopathy.    Left cervical: No superficial, deep or posterior cervical adenopathy.     Upper Body:     Right upper body: No supraclavicular or axillary adenopathy.     Left upper body: No supraclavicular or axillary adenopathy.  Neurological:     General: No focal deficit present.     Mental Status: She is alert and oriented to person, place, and time.  Psychiatric:        Mood and Affect: Mood normal.        Behavior: Behavior normal.     LABS:      Latest Ref Rng & Units 02/15/2023    1:37 PM 06/22/2022    1:53 PM 06/22/2022    5:24 AM  CBC  WBC 4.0 - 10.5 K/uL 7.4  11.6  13.3   Hemoglobin 12.0 - 15.0 g/dL 10.2  9.2  9.0   Hematocrit 36.0 - 46.0 % 32.7  28.6  28.7   Platelets 150 - 400  K/uL 177  167  158       Latest Ref Rng & Units 06/23/2022    4:54 AM 06/22/2022    5:24 AM 06/21/2022    9:20 PM  CMP  Glucose 70 - 99 mg/dL 91  104  121   BUN 8 - 23 mg/dL 19  25  26   $ Creatinine 0.44 - 1.00 mg/dL 0.86  1.02  1.03   Sodium 135 - 145 mmol/L 139  137  134   Potassium 3.5 - 5.1 mmol/L 3.6  3.6  3.9   Chloride 98 - 111 mmol/L 112  106  102   CO2 22 - 32 mmol/L 20  25  24   $ Calcium 8.9 - 10.3 mg/dL 8.4  8.4  8.8   Total Protein 6.5 - 8.1 g/dL  6.1  7.3   Total Bilirubin 0.3 - 1.2 mg/dL  0.1  0.5   Alkaline Phos 38 - 126 U/L  45  58   AST 15 - 41 U/L  18  18   ALT 0 - 44 U/L  11  12      No results found for: "CEA1", "CEA" / No results found for: "CEA1", "CEA" No results found for: "PSA1" No results found for: "CAN199" No results found for: "CAN125"  Lab Results  Component Value Date   TOTALPROTELP 6.8 02/15/2023   ALBUMINELP 3.4 02/15/2023   A1GS 0.3  02/15/2023   A2GS 0.8 02/15/2023   BETS 1.0 02/15/2023   GAMS 1.4 02/15/2023   MSPIKE 0.2 (H) 02/15/2023   SPEI Comment 02/15/2023   Lab Results  Component Value Date   TIBC 251 02/15/2023   TIBC 272 02/13/2010   FERRITIN 228 02/15/2023   FERRITIN 349 12/11/2019   FERRITIN 143 12/02/2018   IRONPCTSAT 19 02/15/2023   IRONPCTSAT 43 02/13/2010   Lab Results  Component Value Date   LDH 137 02/15/2023   LDH 130 12/05/2020   LDH 151 11/30/2019     STUDIES:   No results found.

## 2023-02-21 NOTE — Patient Instructions (Addendum)
[place] at [facility] Discharge Instructions   You were seen and examined today by Dr. Delton Coombes.  He reviewed the results of your lab work. Your hemoglobin has improved to 10.2. Dr. Raliegh Ip is recommending IV iron to keep your hemoglobin at a safe level for surgery. If you don't want IV iron, you can continue to take an iron pill daily.   We will arrange for you to have one dose of IV iron.   Return as scheduled.    Thank you for choosing [place] at [facility] to provide your oncology and hematology care.  To afford each patient quality time with our provider, please arrive at least 15 minutes before your scheduled appointment time.   If you have a lab appointment with the [facility] please come in thru the Main Entrance and check in at the main information desk.  You need to re-schedule your appointment should you arrive 10 or more minutes late.  We strive to give you quality time with our providers, and arriving late affects you and other patients whose appointments are after yours.  Also, if you no show three or more times for appointments you may be dismissed from the clinic at the providers discretion.     Again, thank you for choosing [facility].  Our hope is that these requests will decrease the amount of time that you wait before being seen by our physicians.       _____________________________________________________________  Should you have questions after your visit to [facility], please contact our office at [phone] and follow the prompts.  Our office hours are 8:00 a.m. and 4:30 p.m. Monday - Friday.  Please note that voicemails left after 4:00 p.m. may not be returned until the following business day.  We are closed weekends and major holidays.  You do have access to a nurse 24-7, just call the main number to the clinic [phone] and do not press any options, hold on the line and a nurse will  answer the phone.    For prescription refill requests, have your pharmacy contact our office and allow 72 hours.    Due to Covid, you will need to wear a mask upon entering the hospital. If you do not have a mask, a mask will be given to you at the Main Entrance upon arrival. For doctor visits, patients may have 1 support person age 78 or older with them. For treatment visits, patients can not have anyone with them due to social distancing guidelines and our immunocompromised population.

## 2023-02-22 DIAGNOSIS — Z713 Dietary counseling and surveillance: Secondary | ICD-10-CM | POA: Diagnosis not present

## 2023-02-22 DIAGNOSIS — E43 Unspecified severe protein-calorie malnutrition: Secondary | ICD-10-CM | POA: Diagnosis not present

## 2023-02-22 DIAGNOSIS — T148XXA Other injury of unspecified body region, initial encounter: Secondary | ICD-10-CM | POA: Diagnosis not present

## 2023-02-22 DIAGNOSIS — S41112D Laceration without foreign body of left upper arm, subsequent encounter: Secondary | ICD-10-CM | POA: Diagnosis not present

## 2023-02-22 LAB — IMMUNOFIXATION ELECTROPHORESIS
IgA: 366 mg/dL (ref 64–422)
IgG (Immunoglobin G), Serum: 1239 mg/dL (ref 586–1602)
IgM (Immunoglobulin M), Srm: 154 mg/dL (ref 26–217)
Total Protein ELP: 6.7 g/dL (ref 6.0–8.5)

## 2023-02-22 LAB — COPPER, SERUM: Copper: 118 ug/dL (ref 80–158)

## 2023-02-27 ENCOUNTER — Inpatient Hospital Stay: Payer: Medicare Other

## 2023-02-27 VITALS — BP 125/85 | HR 101 | Temp 98.2°F | Resp 16

## 2023-02-27 DIAGNOSIS — D472 Monoclonal gammopathy: Secondary | ICD-10-CM | POA: Diagnosis not present

## 2023-02-27 DIAGNOSIS — D649 Anemia, unspecified: Secondary | ICD-10-CM | POA: Diagnosis not present

## 2023-02-27 DIAGNOSIS — Z86718 Personal history of other venous thrombosis and embolism: Secondary | ICD-10-CM | POA: Diagnosis not present

## 2023-02-27 DIAGNOSIS — M1712 Unilateral primary osteoarthritis, left knee: Secondary | ICD-10-CM | POA: Diagnosis not present

## 2023-02-27 DIAGNOSIS — D509 Iron deficiency anemia, unspecified: Secondary | ICD-10-CM

## 2023-02-27 DIAGNOSIS — Z803 Family history of malignant neoplasm of breast: Secondary | ICD-10-CM | POA: Diagnosis not present

## 2023-02-27 MED ORDER — CETIRIZINE HCL 10 MG/ML IV SOLN
5.0000 mg | Freq: Once | INTRAVENOUS | Status: AC
  Administered 2023-02-27: 5 mg via INTRAVENOUS
  Filled 2023-02-27: qty 1

## 2023-02-27 MED ORDER — SODIUM CHLORIDE 0.9 % IV SOLN: Freq: Once | INTRAVENOUS | Status: AC

## 2023-02-27 MED ORDER — SODIUM CHLORIDE 0.9 % IV SOLN
510.0000 mg | Freq: Once | INTRAVENOUS | Status: AC
  Administered 2023-02-27: 510 mg via INTRAVENOUS
  Filled 2023-02-27: qty 510

## 2023-02-27 MED ORDER — METHYLPREDNISOLONE SODIUM SUCC 125 MG IJ SOLR
125.0000 mg | Freq: Once | INTRAMUSCULAR | Status: AC
  Administered 2023-02-27: 125 mg via INTRAVENOUS
  Filled 2023-02-27: qty 2

## 2023-02-27 NOTE — Patient Instructions (Signed)
[place]  Discharge Instructions: Thank you for choosing [facility] to provide your oncology and hematology care.  If you have a lab appointment with the [facility], please come in thru the Main Entrance and check in at the main information desk.  Wear comfortable clothing and clothing appropriate for easy access to any Portacath or PICC line.   We strive to give you quality time with your provider. You may need to reschedule your appointment if you arrive late (15 or more minutes).  Arriving late affects you and other patients whose appointments are after yours.  Also, if you miss three or more appointments without notifying the office, you may be dismissed from the clinic at the provider's discretion.      For prescription refill requests, have your pharmacy contact our office and allow 72 hours for refills to be completed.    Ferumoxytol Injection What is this medication? FERUMOXYTOL (FER ue MOX i tol) treats low levels of iron in your body (iron deficiency anemia). Iron is a mineral that plays an important role in making red blood cells, which carry oxygen from your lungs to the rest of your body. This medicine may be used for other purposes; ask your health care provider or pharmacist if you have questions. COMMON BRAND NAME(S): Feraheme What should I tell my care team before I take this medication? They need to know if you have any of these conditions: Anemia not caused by low iron levels High levels of iron in the blood Magnetic resonance imaging (MRI) test scheduled An unusual or allergic reaction to iron, other medications, foods, dyes, or preservatives Pregnant or trying to get pregnant Breastfeeding How should I use this medication? This medication is injected into a vein. It is given by your care team in a hospital or clinic setting. Talk to your care team the use of this medication in children. Special care may be needed. Overdosage:  If you think you have taken too much of this medicine contact a poison control center or emergency room at once. NOTE: This medicine is only for you. Do not share this medicine with others. What if I miss a dose? It is important not to miss your dose. Call your care team if you are unable to keep an appointment. What may interact with this medication? Other iron products This list may not describe all possible interactions. Give your health care provider a list of all the medicines, herbs, non-prescription drugs, or dietary supplements you use. Also tell them if you smoke, drink alcohol, or use illegal drugs. Some items may interact with your medicine. What should I watch for while using this medication? Visit your care team regularly. Tell your care team if your symptoms do not start to get better or if they get worse. You may need blood work done while you are taking this medication. You may need to follow a special diet. Talk to your care team. Foods that contain iron include: whole grains/cereals, dried fruits, beans, or peas, leafy green vegetables, and organ meats (liver, kidney). What side effects may I notice from receiving this medication? Side effects that you should report to your care team as soon as possible: Allergic reactions--skin rash, itching, hives, swelling of the face, lips, tongue, or throat Low blood pressure--dizziness, feeling faint or lightheaded, blurry vision Shortness of breath Side effects that usually do not require medical attention (report to your care team if they continue or are bothersome): Flushing Headache  Joint pain Muscle pain Nausea Pain, redness, or irritation at injection site This list may not describe all possible side effects. Call your doctor for medical advice about side effects. You may report side effects to FDA at 1-800-FDA-1088. Where should I keep my medication? This medication is given in a hospital or clinic and will not be stored at  home. NOTE: This sheet is a summary. It may not cover all possible information. If you have questions about this medicine, talk to your doctor, pharmacist, or health care provider.  2023 Elsevier/Gold Standard (2021-05-10 00:00:00)     To help prevent nausea and vomiting after your treatment, we encourage you to take your nausea medication as directed.  BELOW ARE SYMPTOMS THAT SHOULD BE REPORTED IMMEDIATELY: *FEVER GREATER THAN 100.4 F (38 C) OR HIGHER *CHILLS OR SWEATING *NAUSEA AND VOMITING THAT IS NOT CONTROLLED WITH YOUR NAUSEA MEDICATION *UNUSUAL SHORTNESS OF BREATH *UNUSUAL BRUISING OR BLEEDING *URINARY PROBLEMS (pain or burning when urinating, or frequent urination) *BOWEL PROBLEMS (unusual diarrhea, constipation, pain near the anus) TENDERNESS IN MOUTH AND THROAT WITH OR WITHOUT PRESENCE OF ULCERS (sore throat, sores in mouth, or a toothache) UNUSUAL RASH, SWELLING OR PAIN  UNUSUAL VAGINAL DISCHARGE OR ITCHING   Items with * indicate a potential emergency and should be followed up as soon as possible or go to the Emergency Department if any problems should occur.  Please show the CHEMOTHERAPY ALERT CARD or IMMUNOTHERAPY ALERT CARD at check-in to the Emergency Department and triage nurse.  Should you have questions after your visit or need to cancel or reschedule your appointment, please contact [facility] [facility] [phone]  and follow the prompts.  Office hours are 8:00 a.m. to 4:30 p.m. Monday - Friday. Please note that voicemails left after 4:00 p.m. may not be returned until the following business day.  We are closed weekends and major holidays. You have access to a nurse at all times for urgent questions. Please call the main number to the clinic [phone] and follow the prompts.  For any non-urgent questions, you may also contact your provider using MyChart. We now offer e-Visits for anyone 81 and older to request care online for non-urgent symptoms. For  details visit [email or web address].   Also download the MyChart app! Go to the app store, search "MyChart", open the app, select [facility], and log in with your MyChart username and password.

## 2023-02-27 NOTE — Progress Notes (Signed)
Decrease cetirizine to 5 mg due to age and dose recommendations.  Henreitta Leber, PharmD

## 2023-02-27 NOTE — Progress Notes (Signed)
Patient presents today for Feraheme infusion.  Patient is in satisfactory condition with no new complaints voiced.  Vital signs are stable.  IV place in right AC.  IV flushed well with good blood return noted.  We will proceed with infusion per provider orders.   Patient tolerated infusion well with no complaints voiced.  Patient left via wheelchair with son in stable condition.  Vital signs stable at discharge.  Follow up as scheduled.

## 2023-02-28 ENCOUNTER — Encounter: Payer: Self-pay | Admitting: Radiology

## 2023-03-18 ENCOUNTER — Encounter (HOSPITAL_COMMUNITY): Payer: Self-pay

## 2023-03-18 ENCOUNTER — Emergency Department (HOSPITAL_COMMUNITY): Payer: Medicare Other

## 2023-03-18 ENCOUNTER — Emergency Department (HOSPITAL_COMMUNITY)
Admission: EM | Admit: 2023-03-18 | Discharge: 2023-03-18 | Disposition: A | Discharge status: Home / Self Care | Payer: Medicare Other | Attending: Emergency Medicine | Admitting: Emergency Medicine

## 2023-03-18 ENCOUNTER — Other Ambulatory Visit: Payer: Self-pay

## 2023-03-18 DIAGNOSIS — Z7982 Long term (current) use of aspirin: Secondary | ICD-10-CM | POA: Insufficient documentation

## 2023-03-18 DIAGNOSIS — J45909 Unspecified asthma, uncomplicated: Secondary | ICD-10-CM | POA: Diagnosis not present

## 2023-03-18 DIAGNOSIS — Z7951 Long term (current) use of inhaled steroids: Secondary | ICD-10-CM | POA: Insufficient documentation

## 2023-03-18 DIAGNOSIS — J029 Acute pharyngitis, unspecified: Secondary | ICD-10-CM | POA: Insufficient documentation

## 2023-03-18 DIAGNOSIS — R600 Localized edema: Secondary | ICD-10-CM | POA: Insufficient documentation

## 2023-03-18 DIAGNOSIS — Z79899 Other long term (current) drug therapy: Secondary | ICD-10-CM | POA: Diagnosis not present

## 2023-03-18 DIAGNOSIS — R059 Cough, unspecified: Secondary | ICD-10-CM | POA: Diagnosis not present

## 2023-03-18 DIAGNOSIS — M7122 Synovial cyst of popliteal space [Baker], left knee: Secondary | ICD-10-CM | POA: Insufficient documentation

## 2023-03-18 DIAGNOSIS — I10 Essential (primary) hypertension: Secondary | ICD-10-CM | POA: Insufficient documentation

## 2023-03-18 DIAGNOSIS — J439 Emphysema, unspecified: Secondary | ICD-10-CM | POA: Diagnosis not present

## 2023-03-18 LAB — TROPONIN I (HIGH SENSITIVITY): Troponin I (High Sensitivity): 5 ng/L (ref <18)

## 2023-03-18 LAB — CBC
HCT: 32.9 % — ABNORMAL LOW (ref 36.0–46.0)
Hemoglobin: 10.5 g/dL — ABNORMAL LOW (ref 12.0–15.0)
MCH: 30.3 pg (ref 26.0–34.0)
MCHC: 31.9 g/dL (ref 30.0–36.0)
MCV: 94.8 fL (ref 80.0–100.0)
Platelets: 205 10*3/uL (ref 150–400)
RBC: 3.47 MIL/uL — ABNORMAL LOW (ref 3.87–5.11)
RDW: 13.6 % (ref 11.5–15.5)
WBC: 6.9 10*3/uL (ref 4.0–10.5)
nRBC: 0 % (ref 0.0–0.2)

## 2023-03-18 LAB — BASIC METABOLIC PANEL
Anion gap: 7 (ref 5–15)
BUN: 20 mg/dL (ref 8–23)
CO2: 27 mmol/L (ref 22–32)
Calcium: 8.9 mg/dL (ref 8.9–10.3)
Chloride: 103 mmol/L (ref 98–111)
Creatinine, Ser: 0.97 mg/dL (ref 0.44–1.00)
GFR, Estimated: 55 mL/min — ABNORMAL LOW (ref >60)
Glucose, Bld: 97 mg/dL (ref 70–99)
Potassium: 3.8 mmol/L (ref 3.5–5.1)
Sodium: 137 mmol/L (ref 135–145)

## 2023-03-18 LAB — GROUP A STREP BY PCR: Group A Strep by PCR: NOT DETECTED

## 2023-03-18 NOTE — ED Triage Notes (Signed)
Pt reports she has had a problem with excess mucous and cough for years but it has recently gotten worse and she feels like her throat is swollen.

## 2023-03-18 NOTE — ED Provider Notes (Signed)
[facility] Provider Note   CSN: [id number] Arrival date & time: 03/18/23  1657     History  Chief Complaint  Patient presents with   Cough    Christine Leblanc is a 89 y.o. female.   Cough  This patient is a 89 year old female, she has a history of prior acid reflux high cholesterol and currently lives at home, her son is there helping to take care of her.  Reports that she has been complaining of a bit of a sore throat and difficulty swallowing for the last couple of days.  She is able to swallow both food and fluids and appears to have a little bit of a cough occasionally as well.  Feels like this is gotten worse over the last couple of days and feels like her throat is swollen, she points to her the lymph nodes on the sides of her throat.  No vomiting no diarrhea no fevers or chills    Home Medications Prior to Admission medications   Medication Sig Start Date End Date Taking? Authorizing Provider  albuterol (VENTOLIN HFA) 108 (90 Base) MCG/ACT inhaler Inhale 1-2 puffs into the lungs every 6 (six) hours as needed for wheezing or shortness of breath. Wheezing, Asthma Symptoms 06/23/22   Damita Lack, MD  aspirin EC 81 MG tablet Take 81 mg by mouth daily. Swallow whole.    [provider]  Carbinoxamine Maleate 4 MG TABS Take 1 tablet (4 mg total) by mouth at bedtime. 01/02/23 04/02/23  Valentina Shaggy, MD  chlorhexidine (PERIDEX) 0.12 % solution 15 mLs 2 (two) times daily. 02/04/23   [provider]  ferrous sulfate 325 (65 FE) MG tablet Take 325 mg by mouth daily with breakfast.    [provider]  HYDROcodone-acetaminophen (NORCO/VICODIN) 5-325 MG tablet Take one tablet by mouth evyer 8 hours as needed for pain    [provider]  ipratropium (ATROVENT) 0.06 % nasal spray USE 2 SPRAYS IN EACH NOSTRIL THREE TIMES A DAY 07/25/22   Valentina Shaggy, MD  levocetirizine (XYZAL) 5 MG tablet Take one  tablet by mouth daily as needed for allergies    [provider]  levothyroxine (SYNTHROID) 88 MCG tablet TAKE 1 TABLET DAILY ON MONDAY THROUGH SATURDAY AND ONE-HALF (1/2) TABLET ON SUNDAY 10/10/22   Elayne Snare, MD  omeprazole (PRILOSEC) 40 MG capsule Take 1 capsule (40 mg total) by mouth daily before breakfast. 10/11/22   Erenest Rasher, PA-C  pravastatin (PRAVACHOL) 40 MG tablet TAKE 2 TABLETS DAILY 07/26/22   Ahmed Prima, [place] M, PA-C  senna (SENOKOT) 8.6 MG tablet Take 2.5 tablets by mouth at bedtime.    [provider]      Allergies    Celecoxib, Dexlansoprazole, and Famotidine    Review of Systems   Review of Systems  Respiratory:  Positive for cough.   All other systems reviewed and are negative.   Physical Exam Updated Vital Signs BP (!) 177/90   Pulse 80   Temp 98.9 F (37.2 C) (Temporal)   Resp 15   Ht 1.549 m (5\' 1" )   Wt 45.4 kg   SpO2 99%   BMI 18.89 kg/m  Physical Exam Vitals and nursing note reviewed.  Constitutional:      General: She is not in acute distress.    Appearance: She is well-developed.  HENT:     Head: Normocephalic and atraumatic.     Mouth/Throat:  Pharynx: No oropharyngeal exudate.     Comments: Edentulous, able to open and close the mouth without difficulty, pharynx is normal, phonation is normal, no trismus or torticollis Eyes:     General: No scleral icterus.       Right eye: No discharge.        Left eye: No discharge.     Conjunctiva/sclera: Conjunctivae normal.     Pupils: Pupils are equal, round, and reactive to light.  Neck:     Thyroid: No thyromegaly.     Vascular: No JVD.     Comments: No lymphadenopathy of the neck Cardiovascular:     Rate and Rhythm: Normal rate and regular rhythm.     Heart sounds: Normal heart sounds. No murmur heard.    No friction rub. No gallop.  Pulmonary:     Effort: Pulmonary effort is normal. No respiratory distress.     Breath sounds: Normal breath sounds. No wheezing  or rales.  Abdominal:     General: Bowel sounds are normal. There is no distension.     Palpations: Abdomen is soft. There is no mass.     Tenderness: There is no abdominal tenderness.  Musculoskeletal:        General: No tenderness. Normal range of motion.     Cervical back: Normal range of motion and neck supple.     Right lower leg: No edema.     Left lower leg: Edema present.     Comments: Ace wrap is tight around the left knee due to "Baker's cyst", edema present distal to the Ace wrap  Lymphadenopathy:     Cervical: No cervical adenopathy.  Skin:    General: Skin is warm and dry.     Findings: No erythema or rash.  Neurological:     Mental Status: She is alert.     Coordination: Coordination normal.  Psychiatric:        Behavior: Behavior normal.     ED Results / Procedures / Treatments   Labs (all labs ordered are listed, but only abnormal results are displayed) Labs Reviewed  CBC - Abnormal; Notable for the following components:      Result Value   RBC 3.47 (*)    Hemoglobin 10.5 (*)    HCT 32.9 (*)    All other components within normal limits  BASIC METABOLIC PANEL - Abnormal; Notable for the following components:   GFR, Estimated 55 (*)    All other components within normal limits  GROUP A STREP BY PCR  TROPONIN I (HIGH SENSITIVITY)    EKG EKG Interpretation  Date/Time:  Monday March 18 2023 18:45:51 EDT Ventricular Rate:  84 PR Interval:  171 QRS Duration: 132 QT Interval:  406 QTC Calculation: 480 R Axis:   89 Text Interpretation: Sinus rhythm Atrial premature complex Right bundle branch block Baseline wander in lead(s) I III aVL V3 Confirmed by Noemi Chapel [id number]) on 03/18/2023 8:18:59 PM  Radiology DG Chest 2 View  Result Date: 03/18/2023 CLINICAL DATA:  Cough. EXAM: CHEST - 2 VIEW COMPARISON:  Chest radiograph dated 10/05/2022. FINDINGS: Emphysema. No focal consolidation, pleural effusion, or pneumothorax. The cardiac silhouette is within normal  limits. Degenerative changes of the spine. No acute osseous pathology. Bilateral shoulder arthroplasties. IMPRESSION: No active cardiopulmonary disease.  Emphysema. Electronically Signed   By: Anner Crete M.D.   On: 03/18/2023 17:43    Procedures Procedures    Medications Ordered in ED Medications - No data to display  ED  Course/ Medical Decision Making/ A&P                             Medical Decision Making Amount and/or Complexity of Data Reviewed Labs: ordered. Radiology: ordered. ECG/medicine tests: ordered.   The patient is having some vague symptoms around her throat, will check EKG and some labs, her clinical exam is not concerning whatsoever but will obtain EKG troponin and a strep test.  Her vital signs are normal.  Could be related to acid reflux  This patient has had rather vague symptoms which unfortunately are not necessarily consistent with any specific process.  She states that she feels like her throat is swelling and yet visibly looks totally normal.  She reports that she has something difficult with her neck and swelling but her neck on the outside looks normal there is no thyroid swelling or tenderness there is no lymphadenopathy no trismus or torticollis.  Her labs have come back showing no leukocytosis, no significant anemia, a normal troponin and a metabolic panel which was extremely reassuring and normal.  She was tested for strep and that was normal  Chest x-ray I have personally viewed and interpreted this and it shows no signs of acute abnormalities other than some emphysema which is chronic.  The patient's vital signs been reassuring except for mild hypertension which is very little consequence at this point.  I do not think the patient has any specific abnormalities tonight including no signs of allergic reaction, no signs of cardiac disease, no signs of infection and at this time I think is reasonable to let the patient go home.  The patient will need to  follow-up in the outpatient setting.  The patient and the family member are agreeable at this time        Final Clinical Impression(s) / ED Diagnoses Final diagnoses:  Sore throat    Rx / DC Orders ED Discharge Orders     None         Noemi Chapel, MD 03/18/23 2250

## 2023-03-18 NOTE — Discharge Instructions (Signed)
Your testing has been normal, no signs of strep throat, no signs of infection, no signs of heart abnormalities, your lungs are clear, your vital signs look okay except for slight elevation of blood pressure.  Please take all of your medications that you are supposed to take when you get home tonight and follow-up with your doctor within the next 48 hours, ER for worsening symptoms

## 2023-03-19 ENCOUNTER — Other Ambulatory Visit (HOSPITAL_COMMUNITY): Payer: Self-pay | Admitting: Internal Medicine

## 2023-03-19 ENCOUNTER — Ambulatory Visit (HOSPITAL_COMMUNITY)
Admission: RE | Admit: 2023-03-19 | Discharge: 2023-03-19 | Disposition: A | Discharge status: Home / Self Care | Payer: Medicare Other | Source: Ambulatory Visit | Attending: Internal Medicine | Admitting: Internal Medicine

## 2023-03-19 DIAGNOSIS — R2242 Localized swelling, mass and lump, left lower limb: Secondary | ICD-10-CM

## 2023-03-19 DIAGNOSIS — S41112D Laceration without foreign body of left upper arm, subsequent encounter: Secondary | ICD-10-CM | POA: Diagnosis not present

## 2023-03-19 DIAGNOSIS — M7989 Other specified soft tissue disorders: Secondary | ICD-10-CM | POA: Diagnosis not present

## 2023-03-19 DIAGNOSIS — T148XXA Other injury of unspecified body region, initial encounter: Secondary | ICD-10-CM | POA: Diagnosis not present

## 2023-03-19 DIAGNOSIS — X58XXXD Exposure to other specified factors, subsequent encounter: Secondary | ICD-10-CM | POA: Diagnosis not present

## 2023-03-25 ENCOUNTER — Other Ambulatory Visit: Payer: Self-pay | Admitting: *Deleted

## 2023-03-25 DIAGNOSIS — M7989 Other specified soft tissue disorders: Secondary | ICD-10-CM

## 2023-03-27 DIAGNOSIS — Z79899 Other long term (current) drug therapy: Secondary | ICD-10-CM | POA: Diagnosis not present

## 2023-03-27 DIAGNOSIS — M25562 Pain in left knee: Secondary | ICD-10-CM | POA: Diagnosis not present

## 2023-03-27 DIAGNOSIS — G4700 Insomnia, unspecified: Secondary | ICD-10-CM | POA: Diagnosis not present

## 2023-04-05 ENCOUNTER — Ambulatory Visit (HOSPITAL_COMMUNITY)
Admission: RE | Admit: 2023-04-05 | Discharge: 2023-04-05 | Disposition: A | Discharge status: Home / Self Care | Payer: Medicare Other | Source: Ambulatory Visit | Attending: Vascular Surgery | Admitting: Vascular Surgery

## 2023-04-05 ENCOUNTER — Ambulatory Visit (INDEPENDENT_AMBULATORY_CARE_PROVIDER_SITE_OTHER): Payer: Medicare Other | Admitting: Physician Assistant

## 2023-04-05 VITALS — BP 136/68 | HR 76 | Temp 97.6°F | Resp 14 | Ht 61.0 in | Wt 95.0 lb

## 2023-04-05 DIAGNOSIS — M7989 Other specified soft tissue disorders: Secondary | ICD-10-CM | POA: Insufficient documentation

## 2023-04-05 DIAGNOSIS — M25562 Pain in left knee: Secondary | ICD-10-CM | POA: Diagnosis not present

## 2023-04-05 DIAGNOSIS — I872 Venous insufficiency (chronic) (peripheral): Secondary | ICD-10-CM | POA: Diagnosis not present

## 2023-04-05 NOTE — Progress Notes (Signed)
Requested by:  Cristino MartesHarris, Brandi, NP [address] [place],  [place] [id number]  Reason for consultation: LLE swelling     History of Present Illness   Christine Leblanc is a 89 y.o. ([birth year]) female who presents for evaluation of left leg swelling and pain. She presents with her son. She explains that in the fall she fell and injured her left upper and and left leg. She took a long time for her upper arm wound to heal and was doing PT for her arm and leg. In the process she has had swelling in her leg and continued left knee pain. She has had numerous DVT duplexes that have been negative since November of 2023. She does have a 7 cm Bakers cyst based on duplex. She is a patient of Dr. Shelba FlakeLandau's and has been evaluated for knee replacement. She reports that she has had attempted aspiration of the baker cyst that was unsuccessful and has also had some steroid injections that were not helpful for her pain. Patient and her son explain that Dr. Dion SaucierLandau has been hesitant to proceed with a TKR due to her age and also after her recent fall. She explains that the pain she has is around the left knee with some radiation up the left thigh. At time she says its a 10/10 and feels like " a firecracker going off". She does take Norco when the pain is severe. She additionally has the swelling in her left lower leg. She cannot tolerate compression stockings because they cause skin tears. She tries to elevate some in her lift chair as well as adjustable bed but explains that it is not above level of hear heart. She is also limited by the pain in her knee and she is unable to lay flat due to severe acid reflux. She does currently wrap her left leg in an ACE bandage but just at level of her knee. She does ambulate with rolling walker. She denies any pain in her legs on ambulation. She does not have any open wounds  Venous symptoms include: aching,  throbbing, burning, swelling, sharp, stabbing, shooting pain Onset/duration:  >6   Occupation:  retired Aggravating factors: sitting, standing Alleviating factors: elevation Compression:  no Helps:  no Pain medications:  Norco Previous vein procedures:  no History of DVT:  no  Past Medical History:  Diagnosis Date   Anemia    Asthma    Baker's cyst    Cancer    skin Ca- ? basal cell    Chronic back pain    Chronic neck pain    Degenerative joint disease    Left shoulder; cervical spine, knees & hands    Gastroesophageal reflux disease    Hiatal hernia; distal esophageal web requiring dilatation; gastric polyps; gastritis; refuses colonoscopy   GERD (gastroesophageal reflux disease)    History of stress test 1990's   stress test done under the care of Dr. Dietrich Patesothbart & Dr. Allyson SabalBerry, now being followed by Dr. Wyline MoodBranch[place] in [place] , recently seen & told to f/U in one yr.    Hyperlipidemia 04/27/2011   Hypertension    Hypothyroidism    Lymphocytic thyroiditis    Mild carotid artery disease    Multiple thyroid nodules 2010   Adenomatous; thyroidectomy in 2010   Orthostasis    Osteoarthritis of right knee 07/27/2014   Pneumonia    hosp. for pneumonia- long time ago    Primary localized osteoarthrosis of right shoulder 06/21/2015   Seizures  yes- as a child- & into adult years, states she took med. for them at one time, stopped at 32 yrs. of age    Syncope and collapse 2007   Possible CVA in 2012 with left lower extremity weakness; refused hospitalization; CT-Atrophy and chronic microvascular ischemic change.     Past Surgical History:  Procedure Laterality Date   ABDOMINAL HYSTERECTOMY     fibroids   CATARACT EXTRACTION     Bilateral; redo surgery on the right for incomplete primary procedure   COLONOSCOPY  Remote   ESOPHAGOGASTRODUODENOSCOPY  11/2009   Dr. Darrick Penna: probable distal web s/p dilation small hiatal hernia/gastric polyps/mild gastritis, appeared to have narrowing at the junction of D1 and D2 dilated up to 12 mm   ESOPHAGOGASTRODUODENOSCOPY N/A  03/21/2015   Surgeon: West Bali, MD; proximal esophageal web s/p dilation, multiple gastric polyps s/p multiple biopsies, mild erosive gastritis s/p biopsy.  Pathology with fundic gland polyps, chronic gastritis, negative for H. pylori.   EYE SURGERY     PROLAPSED UTERINE FIBROID LIGATION     outcomed with rectocele & cystocele   SAVORY DILATION N/A 03/21/2015   Procedure: SAVORY DILATION;  Surgeon: West Bali, MD;  Location: [facility];  Service: Endoscopy;  Laterality: N/A;   SHOULDER SURGERY     left   TOTAL KNEE ARTHROPLASTY Right 07/27/2014   Procedure: RIGHT TOTAL KNEE ARTHROPLASTY;  Surgeon: Eulas Post, MD;  Location: [facility] OR;  Service: Orthopedics;  Laterality: Right;   TOTAL SHOULDER ARTHROPLASTY Right 06/21/2015   Procedure: RIGHT TOTAL SHOULDER ARTHROPLASTY;  Surgeon: Teryl Lucy, MD;  Location: [facility] OR;  Service: Orthopedics;  Laterality: Right;   TOTAL THYROIDECTOMY  2010    Social History   Socioeconomic History   Marital status: Widowed    Spouse name: Not on file   Number of children: 4   Years of education: 72   Highest education level: Not on file  Occupational History   Occupation: Artist    Comment: does not yield regular income   Occupation: Retired    Comment: Engineer, civil (consulting)  Tobacco Use   Smoking status: Former    Packs/day: 0.80    Years: 20.00    Additional pack years: 0.00    Total pack years: 16.00    Types: Cigarettes    Quit date: 12/31/1978    Years since quitting: 44.2   Smokeless tobacco: Never  Vaping Use   Vaping Use: Never used  Substance and Sexual Activity   Alcohol use: Yes    Comment: rarely   Drug use: No   Sexual activity: Never    Birth control/protection: Surgical    Comment: widowed since 2010  Other Topics Concern   Not on file  Social History Narrative   ** Merged History Encounter **       Lives at home. Her daughter lives with her.  Caffeine use: 1 cup coffee per day    Social Determinants of Health    Financial Resource Strain: Not on file  Food Insecurity: Not on file  Transportation Needs: Not on file  Physical Activity: Not on file  Stress: Not on file  Social Connections: Not on file  Intimate Partner Violence: Not on file    Family History  Problem Relation Age of Onset   Heart disease Mother    Gallbladder disease Mother    Heart failure Mother    Diabetes Mother    Breast cancer Sister  30 years ago   Diabetes Sister    Heart attack Father    Diabetes Maternal Grandmother    Colon cancer Neg Hx    Stroke Neg Hx     Current Outpatient Medications  Medication Sig Dispense Refill   albuterol (VENTOLIN HFA) 108 (90 Base) MCG/ACT inhaler Inhale 1-2 puffs into the lungs every 6 (six) hours as needed for wheezing or shortness of breath. Wheezing, Asthma Symptoms 8 g 0   aspirin EC 81 MG tablet Take 81 mg by mouth daily. Swallow whole.     ferrous sulfate 325 (65 FE) MG tablet Take 325 mg by mouth daily with breakfast.     HYDROcodone-acetaminophen (NORCO/VICODIN) 5-325 MG tablet Take one tablet by mouth evyer 8 hours as needed for pain     ipratropium (ATROVENT) 0.06 % nasal spray USE 2 SPRAYS IN EACH NOSTRIL THREE TIMES A DAY 45 mL 1   levothyroxine (SYNTHROID) 88 MCG tablet TAKE 1 TABLET DAILY ON MONDAY THROUGH SATURDAY AND ONE-HALF (1/2) TABLET ON SUNDAY 90 tablet 3   omeprazole (PRILOSEC) 40 MG capsule Take 1 capsule (40 mg total) by mouth daily before breakfast. 90 capsule 1   pravastatin (PRAVACHOL) 40 MG tablet TAKE 2 TABLETS DAILY 180 tablet 3   senna (SENOKOT) 8.6 MG tablet Take 2.5 tablets by mouth at bedtime.     Carbinoxamine Maleate 4 MG TABS Take 1 tablet (4 mg total) by mouth at bedtime. 90 tablet 4   chlorhexidine (PERIDEX) 0.12 % solution 15 mLs 2 (two) times daily. (Patient not taking: Reported on 04/05/2023)     levocetirizine (XYZAL) 5 MG tablet Take one tablet by mouth daily as needed for allergies (Patient not taking: Reported on 04/05/2023)      No current facility-administered medications for this visit.    Allergies  Allergen Reactions   Celecoxib Shortness Of Breath   Dexlansoprazole Anaphylaxis and Other (See Comments)    abd pain   Famotidine Other (See Comments)    Makes her feel "very bad"    REVIEW OF SYSTEMS (negative unless checked):   Cardiac:  []  Chest pain or chest pressure? []  Shortness of breath upon activity? []  Shortness of breath when lying flat? []  Irregular heart rhythm?  Vascular:  []  Pain in calf, thigh, or hip brought on by walking? []  Pain in feet at night that wakes you up from your sleep? []  Blood clot in your veins? []  Leg swelling?  Pulmonary:  []  Oxygen at home? []  Productive cough? []  Wheezing?  Neurologic:  []  Sudden weakness in arms or legs? []  Sudden numbness in arms or legs? []  Sudden onset of difficult speaking or slurred speech? []  Temporary loss of vision in one eye? []  Problems with dizziness?  Gastrointestinal:  []  Blood in stool? []  Vomited blood?  Genitourinary:  []  Burning when urinating? []  Blood in urine?  Psychiatric:  []  Major depression  Hematologic:  []  Bleeding problems? []  Problems with blood clotting?  Dermatologic:  []  Rashes or ulcers?  Constitutional:  []  Fever or chills?  Ear/Nose/Throat:  []  Change in hearing? []  Nose bleeds? []  Sore throat?  Musculoskeletal:  []  Back pain? []  Joint pain? []  Muscle pain?   Physical Examination     Vitals:   04/05/23 1425  BP: 136/68  Pulse: 76  Resp: 14  Temp: 97.6 F (36.4 C)  TempSrc: Temporal  SpO2: 97%  Weight: 95 lb (43.1 kg)  Height: 5\' 1"  (1.549 m)   Body mass index is 17.95 kg/m.  General:  WDWN in NAD; vital signs documented above Gait: uses rolling walker HENT: WNL, normocephalic Pulmonary: normal non-labored breathing , without wheezing Cardiac: regular HR Vascular Exam/Pulses:  Right Left  Radial 2+ (normal) 2+ (normal)  Femoral 2+ (normal) 2+ (normal)   Popliteal 2+ (normal) 2+ (normal)  DP 2+ (normal) 2+ (normal)  PT 2+ (normal) 2+ (normal)   Extremities: without varicose veins, without reticular veins, with edema of left mid to distal leg and left foot, without stasis pigmentation, without lipodermatosclerosis, without ulcers Musculoskeletal: no muscle wasting or atrophy  Neurologic: A&O X 3;  No focal weakness or paresthesias are detected Psychiatric:  The pt has Normal affect.  Non-invasive Vascular Imaging   BLE Venous Insufficiency Duplex (04/05/23):  LLE: No DVT and SVT,  [facility]  GSV diameter 0.22-0.31 No SSV reflux No deep venous reflux   Medical Decision Making   Christine Leblanc is a 89 y.o. female who presents with: LLE chronic venous insufficiency Her duplex today shows no DVT or SVT. No Deep or superficial venous reflux. She does have a little reflux at the [facility] of her GSV. Her veins are very small throughout. Based on her duplex she would not be candidate for venous ablation. Her Baker cyst may be contributing to her swelling with compression in the popliteal fossa of her veins. Certainly her knee discomfort is related either to arthritis or the bakers cyst. I recommend that she follow up with Dr. Dion SaucierLandau to discuss further management options.  - based on the patient's history and examination, I recommend: elevation above level of heart, Compression with ACE wraps from foot to knee, refraining from prolonged sitting or standing.  -She can follow up as needed if she has new or concerning symptoms  Christine Congressorrina Roldan Laforest, PA-C [facility] [place] Office: [phone]  04/05/2023, 2:30 PM  Clinic MD:  Karin Lieuobins

## 2023-04-17 ENCOUNTER — Other Ambulatory Visit: Payer: Self-pay | Admitting: Gastroenterology

## 2023-04-17 DIAGNOSIS — K219 Gastro-esophageal reflux disease without esophagitis: Secondary | ICD-10-CM

## 2023-04-19 ENCOUNTER — Other Ambulatory Visit: Payer: Self-pay

## 2023-04-19 ENCOUNTER — Emergency Department (HOSPITAL_COMMUNITY): Payer: Medicare Other

## 2023-04-19 ENCOUNTER — Encounter (HOSPITAL_COMMUNITY): Payer: Self-pay

## 2023-04-19 ENCOUNTER — Emergency Department (HOSPITAL_COMMUNITY)
Admission: EM | Admit: 2023-04-19 | Discharge: 2023-04-20 | Disposition: A | Discharge status: Home / Self Care | Payer: Medicare Other | Attending: Emergency Medicine | Admitting: Emergency Medicine

## 2023-04-19 DIAGNOSIS — Z7951 Long term (current) use of inhaled steroids: Secondary | ICD-10-CM | POA: Diagnosis not present

## 2023-04-19 DIAGNOSIS — Z7982 Long term (current) use of aspirin: Secondary | ICD-10-CM | POA: Insufficient documentation

## 2023-04-19 DIAGNOSIS — J449 Chronic obstructive pulmonary disease, unspecified: Secondary | ICD-10-CM | POA: Diagnosis not present

## 2023-04-19 DIAGNOSIS — R457 State of emotional shock and stress, unspecified: Secondary | ICD-10-CM | POA: Diagnosis not present

## 2023-04-19 DIAGNOSIS — R0989 Other specified symptoms and signs involving the circulatory and respiratory systems: Secondary | ICD-10-CM | POA: Diagnosis present

## 2023-04-19 DIAGNOSIS — J45909 Unspecified asthma, uncomplicated: Secondary | ICD-10-CM | POA: Diagnosis not present

## 2023-04-19 DIAGNOSIS — R1310 Dysphagia, unspecified: Secondary | ICD-10-CM | POA: Insufficient documentation

## 2023-04-19 DIAGNOSIS — T17920A Food in respiratory tract, part unspecified causing asphyxiation, initial encounter: Secondary | ICD-10-CM | POA: Diagnosis not present

## 2023-04-19 DIAGNOSIS — R059 Cough, unspecified: Secondary | ICD-10-CM | POA: Diagnosis not present

## 2023-04-19 DIAGNOSIS — R1319 Other dysphagia: Secondary | ICD-10-CM

## 2023-04-19 NOTE — ED Notes (Addendum)
Patient transported to radiology via wheelchair.

## 2023-04-19 NOTE — ED Provider Notes (Signed)
[place] EMERGENCY DEPARTMENT AT [facility] Provider Note   CSN: [id number] Arrival date & time: 04/19/23  2114     History  Chief Complaint  Patient presents with   chest congestion    Christine Leblanc is a 89 y.o. female.  HPI   This patient is a 89 year old female, she has a history of an esophageal web which was seen on endoscopy approximately 8 years ago.  She has no teeth and has to pure all of her food and liquids.  She has been eating using this protocol for some time including many years.  She reports that today after eating some sweet potatoes she had more difficulty swallowing and having some difficulty tolerating her own mucus and saliva tonight.  She feels like she has a foreign body in her throat.  She has not been vomiting, she has not been coughing, she is having normal bowel movements  Review of the medical record shows that this patient did have a upper endoscopy with an esophageal dilation in 2016 by Dr. Darrick Penna  Home Medications Prior to Admission medications   Medication Sig Start Date End Date Taking? Authorizing Provider  albuterol (VENTOLIN HFA) 108 (90 Base) MCG/ACT inhaler Inhale 1-2 puffs into the lungs every 6 (six) hours as needed for wheezing or shortness of breath. Wheezing, Asthma Symptoms 06/23/22   Dimple Nanas, MD  aspirin EC 81 MG tablet Take 81 mg by mouth daily. Swallow whole.    [provider]  Carbinoxamine Maleate 4 MG TABS Take 1 tablet (4 mg total) by mouth at bedtime. 01/02/23 04/02/23  Alfonse Spruce, MD  chlorhexidine (PERIDEX) 0.12 % solution 15 mLs 2 (two) times daily. Patient not taking: Reported on 04/05/2023 02/04/23   [provider]  ferrous sulfate 325 (65 FE) MG tablet Take 325 mg by mouth daily with breakfast.    [provider]  HYDROcodone-acetaminophen (NORCO/VICODIN) 5-325 MG tablet Take one tablet by mouth evyer 8 hours as needed for pain    [provider]  ipratropium  (ATROVENT) 0.06 % nasal spray USE 2 SPRAYS IN [facility] NOSTRIL THREE TIMES A DAY 07/25/22   Alfonse Spruce, MD  levocetirizine (XYZAL) 5 MG tablet Take one tablet by mouth daily as needed for allergies Patient not taking: Reported on 04/05/2023    [provider]  levothyroxine (SYNTHROID) 88 MCG tablet TAKE 1 TABLET DAILY ON MONDAY THROUGH SATURDAY AND ONE-HALF (1/2) TABLET ON SUNDAY 10/10/22   Reather Littler, MD  omeprazole (PRILOSEC) 40 MG capsule TAKE 1 CAPSULE DAILY BEFORE BREAKFAST 04/18/23   Ermalinda Memos S, PA-C  pravastatin (PRAVACHOL) 40 MG tablet TAKE 2 TABLETS DAILY 07/26/22   Iran Ouch, [place] M, PA-C  senna (SENOKOT) 8.6 MG tablet Take 2.5 tablets by mouth at bedtime.    [provider]      Allergies    Celecoxib, Dexlansoprazole, and Famotidine    Review of Systems   Review of Systems  All other systems reviewed and are negative.   Physical Exam Updated Vital Signs BP (!) 175/91   Pulse 81   Temp 98.2 F (36.8 C) (Oral)   Resp 17   Ht 1.549 m ( )   Wt 43.1 kg   SpO2 97%   BMI 17.95 kg/m  Physical Exam Vitals and nursing note reviewed.  Constitutional:      General: She is not in acute distress.    Appearance: She is well-developed.  HENT:     Head:  Normocephalic and atraumatic.     Comments: No teeth in the mouth, able to open and close her mouth without difficulty    Mouth/Throat:     Pharynx: No oropharyngeal exudate.  Eyes:     General: No scleral icterus.       Right eye: No discharge.        Left eye: No discharge.     Conjunctiva/sclera: Conjunctivae normal.     Pupils: Pupils are equal, round, and reactive to light.  Neck:     Thyroid: No thyromegaly.     Vascular: No JVD.  Cardiovascular:     Rate and Rhythm: Normal rate and regular rhythm.     Heart sounds: Normal heart sounds. No murmur heard.    No friction rub. No gallop.  Pulmonary:     Effort: Pulmonary effort is normal. No respiratory distress.     Breath sounds:  Normal breath sounds. No wheezing or rales.  Abdominal:     General: Bowel sounds are normal. There is no distension.     Palpations: Abdomen is soft. There is no mass.     Tenderness: There is no abdominal tenderness.  Musculoskeletal:        General: No tenderness. Normal range of motion.     Cervical back: Normal range of motion and neck supple.     Right lower leg: No edema.     Left lower leg: No edema.  Lymphadenopathy:     Cervical: No cervical adenopathy.  Skin:    General: Skin is warm and dry.     Findings: No erythema or rash.  Neurological:     Mental Status: She is alert.     Coordination: Coordination normal.  Psychiatric:        Behavior: Behavior normal.     ED Results / Procedures / Treatments   Labs (all labs ordered are listed, but only abnormal results are displayed) Labs Reviewed - No data to display  EKG None  Radiology DG Chest 2 View  Result Date: 04/19/2023 CLINICAL DATA:  Cough, chest congestion EXAM: CHEST - 2 VIEW COMPARISON:  03/18/2023 FINDINGS: The lungs are mildly hyperinflated in keeping with changes of underlying COPD. Mild biapical scarring again noted. No pneumothorax or pleural effusion. Cardiac size within normal limits. Pulmonary vascularity is normal. No acute bone abnormality. Degenerative changes are seen within the thoracic spine. Bilateral total shoulder arthroplasty has been performed. IMPRESSION: 1. No active cardiopulmonary disease. COPD. Electronically Signed   By: Helyn Numbers M.D.   On: 04/19/2023 22:29    Procedures Procedures    Medications Ordered in ED Medications - No data to display  ED Course/ Medical Decision Making/ A&P                             Medical Decision Making Amount and/or Complexity of Data Reviewed Radiology: ordered.   This patient presents to the ED for concern of difficulty swallowing, this involves an extensive number of treatment options, and is a complaint that carries with it a high  risk of complications and morbidity.  The differential diagnosis includes esophageal obstruction, would also consider that she just has a globus sensation.  She reports that she has been able to drink and eat her normal pured foods today.  We will do a p.o. trial, chest x-ray to rule out other sources   Co morbidities that complicate the patient evaluation  Elderly, known esophageal web  Additional history obtained:  Additional history obtained from medical record External records from outside source obtained and reviewed including endoscopy that was performed in 2016 showing esophageal web    Imaging Studies ordered:  I ordered imaging studies including chest x-ray I independently visualized and interpreted imaging which showed no acute findings I agree with the radiologist interpretation   Cardiac Monitoring: / EKG:  The patient was maintained on a cardiac monitor.  I personally viewed and interpreted the cardiac monitored which showed an underlying rhythm of: Normal sinus rhythm   Consultations Obtained:  Patient follow-up with GI is reasonable  Problem List / ED Course / Critical interventions / Medication management  The patient has been able to tolerate clear liquids in fact she states that she felt better after she was able to swallow the liquids.  Based on a swallow study that she had in September of last year she has some esophageal dysphagia which is quite significant I have reviewed the patients home medicines and have made adjustments as needed   Social Determinants of Health:  Elderly   Test / Admission - Considered:  Considered admission but patient can follow-up outpatient with GI         Final Clinical Impression(s) / ED Diagnoses Final diagnoses:  Esophageal dysphagia    Rx / DC Orders ED Discharge Orders     None         Eber Hong, MD 04/19/23 2312

## 2023-04-19 NOTE — Discharge Instructions (Signed)
Please continue to follow a pured diet, small amounts at a time, return to the ER for severe or worsening symptoms.  Please follow-up with Dr. Marletta Lor this week.

## 2023-04-19 NOTE — ED Triage Notes (Signed)
Pt BIB [facility] from home with c/o mucous congestion. Pt says it has been going on for 6-7 years, pt showed this nurse what she spit up on towel- nothing was observed on paper towel- pt says she lives with her daughter who she takes care of because she is disabled. Pt denies pain, has no other complaints.

## 2023-04-19 NOTE — ED Notes (Signed)
Pt able to successfully swallow 30 cc of warm water with a straw.

## 2023-04-23 DIAGNOSIS — E782 Mixed hyperlipidemia: Secondary | ICD-10-CM | POA: Diagnosis not present

## 2023-04-23 DIAGNOSIS — R7301 Impaired fasting glucose: Secondary | ICD-10-CM | POA: Diagnosis not present

## 2023-04-24 ENCOUNTER — Telehealth: Payer: Self-pay | Admitting: *Deleted

## 2023-04-24 NOTE — Telephone Encounter (Signed)
        Patient  visited [facility] on 04/20/2023  for treatment    Telephone encounter attempt :  1st  A HIPAA compliant voice message was left requesting a return call.  Instructed patient to call back at [phone].  Christine Leblanc -[facility] [place], Population Health [phone] [address] [place] , [place] [address] Email : [email or web address]

## 2023-04-24 NOTE — Telephone Encounter (Signed)
Pt called and states she is having problems swallowing. She states she is getting choked on everything. She is taking omeprazole and guaifenesin, because she has a lot of mucus in her throat. She has an OV next week , but states she can't wait till them. Please advise.

## 2023-04-25 ENCOUNTER — Telehealth: Payer: Self-pay | Admitting: *Deleted

## 2023-04-25 NOTE — Telephone Encounter (Signed)
        Patient  visited [facility] on 04/20/2023  for treatment    Telephone encounter attempt :  2nd  A HIPAA compliant voice message was left requesting a return call.  Instructed patient to call back at [phone].  Yehuda Mao Greenauer -[facility] [place], Population Health [phone] [address] [place] , [place] [address] Email : [email or web address]

## 2023-04-30 NOTE — Progress Notes (Signed)
Referring Provider: Benita Stabile, MD Primary Care Physician:  Benita Stabile, MD Primary GI Physician: Dr. Marletta Lor  No chief complaint on file.   HPI:   Christine Leblanc is a 89 y.o. female with history of GERD, dysphagia, weight loss, constipation.  Modified barium swallow in 2017 with speech therapy revealed mild oral phase and moderate/severe pharyngeal phase dysphagia with both sensory and motor deficits.    EGD March 2016: Proximal esophageal web s/p dilation, multiple gastric polyps s/p multiple biopsies, mild erosive gastritis s/p biopsy.  Pathology with fundic gland polyps, chronic gastritis, negative for H. pylori.  Modified barium swallow 09/24/2022:  Severe oropharyngeal dysphagia characterized by weak lingual movement resulting in prolonged oral transit, piecemeal deglutition, reduced anterior posterior movement with lingual residuals; pharyngeal phase is marked by impaired movement and timing of velopharyngeal closure, reduced tongue base retraction and little no epiglottic deflection with reduced laryngeal vestibule closure resulting in significant vallecula residue and aspiration after the swallow with all liquid consistencies. Head turn to the LEFT was possibly slightly better in terms of moving more of the bolus through the pharynx and UES than head neutral or to the right. Pt with wet vocal quality and intermittent throat clears and some coughing throughout, but never able to remove aspirate from trachea. The barium tablet became lodged in the valleculae and was not cleared and Pt without awareness of the same (reduced sensation). This imaging was compare to her last MBSS in 2017 and found to be similar in terms of physiology, but worse (greater weakness and increased residuals and aspiration).    Pt may wish to consider alternative means of nutrition to supplement oral nutrition, however she is unsure she wants to do this. Recommend that she meet with a dietician to help guide her  in amount of calories needed per day. She would like to continue with oral diet and she was encouraged to continue practicing good oral care and improve her cough to continue with D2/soft finely chopped and thin liquids. There did not appear to be an appreciable difference in thin versus thickened liquids (increased residuals with thicker consistencies and textures). Because the barium tablet was not removed from her valleculae, she should try to crush medications as able and take in puree/yogurt and follow with liquid wash.   Patient was last seen in our office 10/10/2022 with her son.  She has lost 8 pounds in the last 6 months.  She reported speech therapist was coming out to her home to work with her on strengthening her swallowing, but she continued to have significant trouble.  Only had 3 teeth, planning to get implants.  Eating primarily mashed potatoes, mashed beans, cake, ice cream, peanut butter cups.  Had recently started drinking 1 boost per day.  Was not interested in a feeding tube.  Had not seen the dietitian.  GERD well-controlled on omeprazole 40 mg daily.  Denied nausea, vomiting, abdominal pain.  Constipation well-controlled on 2 Senokot at bedtime.  Recommended continuing current medications, refer to dietitian, continue soft diet, 3 Ensure daily, follow-up in 3 months.  Patient never saw dietitian.  They tried reaching her to schedule an appointment including reaching her daughter, but they could not reach anyone.  Messages were left requesting a return call.  She was seen in the emergency room 04/19/2023 for worsening dysphagia.  She reported after eating some sweet potatoes, she had more difficulty swallowing and had some difficulty tolerating her own mucus and saliva.  Belly  feels a foreign body in her throat. Chest x-ray without acute findings. Patient felt better after trial of liquids.    Today:      Past Medical History:  Diagnosis Date   Anemia    Asthma    Baker's cyst     Cancer (HCC)    skin Ca- ? basal cell    Chronic back pain    Chronic neck pain    Degenerative joint disease    Left shoulder; cervical spine, knees & hands    Gastroesophageal reflux disease    Hiatal hernia; distal esophageal web requiring dilatation; gastric polyps; gastritis; refuses colonoscopy   GERD (gastroesophageal reflux disease)    History of stress test 1990's   stress test done under the care of Dr. Dietrich Pates & Dr. Allyson Sabal, now being followed by Dr. Wyline Mood- in [place] , recently seen & told to f/U in one yr.    Hyperlipidemia 04/27/2011   Hypertension    Hypothyroidism    Lymphocytic thyroiditis    Mild carotid artery disease (HCC)    Multiple thyroid nodules 2010   Adenomatous; thyroidectomy in 2010   Orthostasis    Osteoarthritis of right knee 07/27/2014   Pneumonia    hosp. for pneumonia- long time ago    Primary localized osteoarthrosis of right shoulder 06/21/2015   Seizures (HCC)    yes- as a child- & into adult years, states she took med. for them at one time, stopped at 32 yrs. of age    Syncope and collapse 2007   Possible CVA in 2012 with left lower extremity weakness; refused hospitalization; CT-Atrophy and chronic microvascular ischemic change.     Past Surgical History:  Procedure Laterality Date   ABDOMINAL HYSTERECTOMY     fibroids   CATARACT EXTRACTION     Bilateral; redo surgery on the right for incomplete primary procedure   COLONOSCOPY  Remote   ESOPHAGOGASTRODUODENOSCOPY  11/2009   Dr. Darrick Penna: probable distal web s/p dilation small hiatal hernia/gastric polyps/mild gastritis, appeared to have narrowing at the junction of D1 and D2 dilated up to 12 mm   ESOPHAGOGASTRODUODENOSCOPY N/A 03/21/2015   Surgeon: West Bali, MD; proximal esophageal web s/p dilation, multiple gastric polyps s/p multiple biopsies, mild erosive gastritis s/p biopsy.  Pathology with fundic gland polyps, chronic gastritis, negative for H. pylori.   EYE SURGERY      PROLAPSED UTERINE FIBROID LIGATION     outcomed with rectocele & cystocele   SAVORY DILATION N/A 03/21/2015   Procedure: SAVORY DILATION;  Surgeon: West Bali, MD;  Location: [facility];  Service: Endoscopy;  Laterality: N/A;   SHOULDER SURGERY     left   TOTAL KNEE ARTHROPLASTY Right 07/27/2014   Procedure: RIGHT TOTAL KNEE ARTHROPLASTY;  Surgeon: Eulas Post, MD;  Location: [facility] OR;  Service: Orthopedics;  Laterality: Right;   TOTAL SHOULDER ARTHROPLASTY Right 06/21/2015   Procedure: RIGHT TOTAL SHOULDER ARTHROPLASTY;  Surgeon: Teryl Lucy, MD;  Location: [facility] OR;  Service: Orthopedics;  Laterality: Right;   TOTAL THYROIDECTOMY  2010    Current Outpatient Medications  Medication Sig Dispense Refill   albuterol (VENTOLIN HFA) 108 (90 Base) MCG/ACT inhaler Inhale 1-2 puffs into the lungs every 6 (six) hours as needed for wheezing or shortness of breath. Wheezing, Asthma Symptoms 8 g 0   aspirin EC 81 MG tablet Take 81 mg by mouth daily. Swallow whole.     Carbinoxamine Maleate 4 MG TABS Take 1 tablet (4 mg total) by  mouth at bedtime. 90 tablet 4   chlorhexidine (PERIDEX) 0.12 % solution 15 mLs 2 (two) times daily. (Patient not taking: Reported on 04/05/2023)     ferrous sulfate 325 (65 FE) MG tablet Take 325 mg by mouth daily with breakfast.     HYDROcodone-acetaminophen (NORCO/VICODIN) 5-325 MG tablet Take one tablet by mouth evyer 8 hours as needed for pain     ipratropium (ATROVENT) 0.06 % nasal spray USE 2 SPRAYS IN EACH NOSTRIL THREE TIMES A DAY 45 mL 1   levocetirizine (XYZAL) 5 MG tablet Take one tablet by mouth daily as needed for allergies (Patient not taking: Reported on 04/05/2023)     levothyroxine (SYNTHROID) 88 MCG tablet TAKE 1 TABLET DAILY ON MONDAY THROUGH SATURDAY AND ONE-HALF (1/2) TABLET ON SUNDAY 90 tablet 3   omeprazole (PRILOSEC) 40 MG capsule TAKE 1 CAPSULE DAILY BEFORE BREAKFAST 90 capsule 3   pravastatin (PRAVACHOL) 40 MG tablet TAKE 2 TABLETS DAILY 180 tablet  3   senna (SENOKOT) 8.6 MG tablet Take 2.5 tablets by mouth at bedtime.     No current facility-administered medications for this visit.    Allergies as of 05/01/2023 - Review Complete 04/19/2023  Allergen Reaction Noted   Celecoxib Shortness Of Breath    Dexlansoprazole Anaphylaxis and Other (See Comments) 05/04/2016   Famotidine Other (See Comments) 11/04/2018    Family History  Problem Relation Age of Onset   Heart disease Mother    Gallbladder disease Mother    Heart failure Mother    Diabetes Mother    Breast cancer Sister        30  years ago   Diabetes Sister    Heart attack Father    Diabetes Maternal Grandmother    Colon cancer Neg Hx    Stroke Neg Hx     Social History   Socioeconomic History   Marital status: Widowed    Spouse name: Not on file   Number of children: 4   Years of education: 37   Highest education level: Not on file  Occupational History   Occupation: Artist    Comment: does not yield regular income   Occupation: Retired    Comment: Engineer, civil (consulting)  Tobacco Use   Smoking status: Former    Packs/day: 0.80    Years: 20.00    Additional pack years: 0.00    Total pack years: 16.00    Types: Cigarettes    Quit date: 12/31/1978    Years since quitting: 44.3   Smokeless tobacco: Never  Vaping Use   Vaping Use: Never used  Substance and Sexual Activity   Alcohol use: Yes    Comment: rarely   Drug use: No   Sexual activity: Never    Birth control/protection: Surgical    Comment: widowed since 2010  Other Topics Concern   Not on file  Social History Narrative   ** Merged History Encounter **       Lives at home. Her daughter lives with her.  Caffeine use: 1 cup coffee per day    Social Determinants of Health   Financial Resource Strain: Not on file  Food Insecurity: Not on file  Transportation Needs: Not on file  Physical Activity: Not on file  Stress: Not on file  Social Connections: Not on file    Review of Systems: Gen: Denies  fever, chills, anorexia. Denies fatigue, weakness, weight loss.  CV: Denies chest pain, palpitations, syncope, peripheral edema, and claudication. Resp: Denies dyspnea at rest, cough, wheezing,  coughing up blood, and pleurisy. GI: Denies vomiting blood, jaundice, and fecal incontinence.   Denies dysphagia or odynophagia. Derm: Denies rash, itching, dry skin Psych: Denies depression, anxiety, memory loss, confusion. No homicidal or suicidal ideation.  Heme: Denies bruising, bleeding, and enlarged lymph nodes.  Physical Exam: There were no vitals taken for this visit. General:   Alert and oriented. No distress noted. Pleasant and cooperative.  Head:  Normocephalic and atraumatic. Eyes:  Conjuctiva clear without scleral icterus. Heart:  S1, S2 present without murmurs appreciated. Lungs:  Clear to auscultation bilaterally. No wheezes, rales, or rhonchi. No distress.  Abdomen:  +BS, soft, non-tender and non-distended. No rebound or guarding. No HSM or masses noted. Msk:  Symmetrical without gross deformities. Normal posture. Extremities:  Without edema. Neurologic:  Alert and  oriented x4 Psych:  Normal mood and affect.    Assessment:     Plan:  ***   Ermalinda Memos, PA-C [facility] 05/01/2023

## 2023-05-01 ENCOUNTER — Ambulatory Visit (INDEPENDENT_AMBULATORY_CARE_PROVIDER_SITE_OTHER): Payer: Medicare Other | Admitting: Gastroenterology

## 2023-05-01 ENCOUNTER — Encounter: Payer: Self-pay | Admitting: Gastroenterology

## 2023-05-01 VITALS — BP 143/78 | HR 97 | Temp 97.8°F | Ht 61.0 in | Wt 98.4 lb

## 2023-05-01 DIAGNOSIS — R1312 Dysphagia, oropharyngeal phase: Secondary | ICD-10-CM | POA: Diagnosis not present

## 2023-05-01 DIAGNOSIS — K219 Gastro-esophageal reflux disease without esophagitis: Secondary | ICD-10-CM

## 2023-05-01 DIAGNOSIS — K5900 Constipation, unspecified: Secondary | ICD-10-CM | POA: Diagnosis not present

## 2023-05-01 DIAGNOSIS — R1013 Epigastric pain: Secondary | ICD-10-CM

## 2023-05-01 DIAGNOSIS — R0989 Other specified symptoms and signs involving the circulatory and respiratory systems: Secondary | ICD-10-CM

## 2023-05-01 MED ORDER — OMEPRAZOLE 40 MG PO CPDR: 40.0000 mg | DELAYED_RELEASE_CAPSULE | Freq: Two times a day (BID) | ORAL | 3 refills | Status: DC

## 2023-05-01 NOTE — Patient Instructions (Addendum)
Increase omeprazole to 40 mg twice daily 30 minutes before breakfast and dinner.  You should sleep elevated using a wedge or propping your bed up on wood or bricks to create at least a 6 inch incline.  Continue following a soft/pured diet.  Drink 2-3 protein shakes daily/boost to help maintain/gain weight.  Continue senna for constipation.  We will follow-up with you in 8 weeks.  Ermalinda Memos, PA-C [facility]

## 2023-05-13 ENCOUNTER — Ambulatory Visit
Admission: EM | Admit: 2023-05-13 | Discharge: 2023-05-13 | Disposition: A | Discharge status: Home / Self Care | Payer: Medicare Other

## 2023-05-13 ENCOUNTER — Encounter (HOSPITAL_COMMUNITY): Payer: Self-pay

## 2023-05-13 ENCOUNTER — Emergency Department (HOSPITAL_COMMUNITY)
Admission: EM | Admit: 2023-05-13 | Discharge: 2023-05-13 | Disposition: A | Discharge status: Home / Self Care | Payer: Medicare Other | Attending: Emergency Medicine | Admitting: Emergency Medicine

## 2023-05-13 ENCOUNTER — Other Ambulatory Visit: Payer: Self-pay

## 2023-05-13 DIAGNOSIS — M25561 Pain in right knee: Secondary | ICD-10-CM

## 2023-05-13 DIAGNOSIS — M7989 Other specified soft tissue disorders: Secondary | ICD-10-CM | POA: Insufficient documentation

## 2023-05-13 DIAGNOSIS — R2241 Localized swelling, mass and lump, right lower limb: Secondary | ICD-10-CM

## 2023-05-13 DIAGNOSIS — Z7982 Long term (current) use of aspirin: Secondary | ICD-10-CM | POA: Diagnosis not present

## 2023-05-13 NOTE — ED Triage Notes (Signed)
Pt c/o right foot pain and right knee pain, Hx of blood clot left leg Dec 2024. Denies injury to right leg. Denies SOB.

## 2023-05-13 NOTE — Discharge Instructions (Signed)
I do not believe the symptoms you described today are coming from a blood clot in your leg.  If you are concerned about having a blood clot, please go directly to the ER for further evaluation and management.

## 2023-05-13 NOTE — Discharge Instructions (Signed)
Please return tomorrow for ultrasound.  Follow-up with your regular doctor.  Return to the emergency department if any worsening or concerning symptoms.

## 2023-05-13 NOTE — ED Triage Notes (Signed)
Right foot swelling that started 3 days ago. Pt states she has a tender spot on her right calf, does have history of blood clots.

## 2023-05-13 NOTE — ED Provider Notes (Signed)
[place] EMERGENCY DEPARTMENT AT [facility] Provider Note   CSN: [id number] Arrival date & time: 05/13/23  1728     History {Add pertinent medical, surgical, social history, OB history to HPI:1} Chief Complaint  Patient presents with   Foot Pain    Christine Leblanc is a 89 y.o. female.  She is brought in by family member for complaint of right knee pain and right foot swelling that is been going on for an unclear period of time.  She is concerned she has a blood clot.  She said she had a blood clot in her left leg around Christmas time and took blood thinners, followed up with Dr. Dion Saucier.  She is not taking any blood thinner now.  She had some bleeding from a wound on the front of her leg yesterday.  No known trauma.  No chest pain or shortness of breath.  She has had a right knee replacement.  She denies any foot pain, no numbness or weakness.  She went to urgent care today who recommended she come here to get an ultrasound  The history is provided by the patient.  Leg Pain Location:  Knee Injury: no   Knee location:  R knee Pain details:    Quality:  Aching   Severity:  Mild   Progression:  Unchanged Dislocation: no   Relieved by:  None tried Worsened by:  Bearing weight and activity Ineffective treatments:  None tried Associated symptoms: swelling (foot)   Associated symptoms: no fever        Home Medications Prior to Admission medications   Medication Sig Start Date End Date Taking? Authorizing Provider  guaiFENesin (MUCINEX) 600 MG 12 hr tablet Take 600 mg by mouth 2 (two) times daily.   Yes [provider]  albuterol (VENTOLIN HFA) 108 (90 Base) MCG/ACT inhaler Inhale 1-2 puffs into the lungs every 6 (six) hours as needed for wheezing or shortness of breath. Wheezing, Asthma Symptoms 06/23/22   Dimple Nanas, MD  aspirin EC 81 MG tablet Take 81 mg by mouth daily. Swallow whole.    [provider]  Carbinoxamine Maleate 4 MG TABS Take 1  tablet (4 mg total) by mouth at bedtime. 01/02/23 04/02/23  Alfonse Spruce, MD  ferrous sulfate 325 (65 FE) MG tablet Take 325 mg by mouth. Takes one on Monday, Wednesday and Friday    [provider]  HYDROcodone-acetaminophen (NORCO/VICODIN) 5-325 MG tablet Take one tablet by mouth evyer 8 hours as needed for pain    [provider]  ipratropium (ATROVENT) 0.06 % nasal spray USE 2 SPRAYS IN [facility] NOSTRIL THREE TIMES A DAY 07/25/22   Alfonse Spruce, MD  levothyroxine (SYNTHROID) 88 MCG tablet TAKE 1 TABLET DAILY ON MONDAY THROUGH SATURDAY AND ONE-HALF (1/2) TABLET ON SUNDAY 10/10/22   Reather Littler, MD  omeprazole (PRILOSEC) 40 MG capsule Take 1 capsule (40 mg total) by mouth 2 (two) times daily before a meal. 05/01/23   Harper, Gwendalyn Ege, PA-C  OVER THE COUNTER MEDICATION Chest congestion relief and mucus relief dm daily    [provider]  pravastatin (PRAVACHOL) 40 MG tablet TAKE 2 TABLETS DAILY 07/26/22   [place] Ouch, [place] M, PA-C  senna (SENOKOT) 8.6 MG tablet Take 2 tablets by mouth at bedtime.    [provider]      Allergies    Celecoxib, Dexlansoprazole, and Famotidine    Review of Systems   Review of Systems  Constitutional:  Negative  for fever.  Cardiovascular:  Positive for leg swelling.  Skin:  Positive for wound.    Physical Exam Updated Vital Signs BP (!) 146/83 (BP Location: Right Arm)   Pulse 76   Temp 98.3 F (36.8 C) (Oral)   Resp 18   Ht 5\' 1"  (1.549 m)   Wt 44.6 kg   SpO2 96%   BMI 18.59 kg/m  Physical Exam Constitutional:      Appearance: Normal appearance. She is well-developed.  HENT:     Head: Normocephalic and atraumatic.  Eyes:     Conjunctiva/sclera: Conjunctivae normal.  Musculoskeletal:        General: Tenderness present.     Cervical back: Neck supple.     Comments: She has a well-healed surgical scar over right knee.  There is full range of motion.  There is no significant effusion or overlying  erythema.  She is minimal edema over the dorsum of her right foot.  There is no particular bony tenderness.  There is no cords appreciated in the calf.  Skin:    General: Skin is warm and dry.  Neurological:     General: No focal deficit present.     Mental Status: She is alert.     GCS: GCS eye subscore is 4. GCS verbal subscore is 5. GCS motor subscore is 6.     Sensory: No sensory deficit.     Motor: No weakness.     ED Results / Procedures / Treatments   Labs (all labs ordered are listed, but only abnormal results are displayed) Labs Reviewed - No data to display  EKG None  Radiology No results found.  Procedures Procedures  {Document cardiac monitor, telemetry assessment procedure when appropriate:1}  Medications Ordered in ED Medications - No data to display  ED Course/ Medical Decision Making/ A&P   {   Click here for ABCD2, HEART and other calculatorsREFRESH Note before signing :1}                          Medical Decision Making  ***  {Document critical care time when appropriate:1} {Document review of labs and clinical decision tools ie heart score, Chads2Vasc2 etc:1}  {Document your independent review of radiology images, and any outside records:1} {Document your discussion with family members, caretakers, and with consultants:1} {Document social determinants of health affecting pt's care:1} {Document your decision making why or why not admission, treatments were needed:1} Final Clinical Impression(s) / ED Diagnoses Final diagnoses:  None    Rx / DC Orders ED Discharge Orders          Ordered    US Venous Img Lower Unilateral Right        05/13/23 1819

## 2023-05-13 NOTE — ED Provider Notes (Signed)
[facility]    CSN: [id number] Arrival date & time: 05/13/23  1535      History   Chief Complaint Chief Complaint  Patient presents with   Foot Pain    HPI SIMORA MONTEON is a 89 y.o. female.   Patient presents today with right foot swelling and pain.  She denies recent fall, accident, trauma, or injury involving the foot.  Reports she is concerned about having a blood clot and has had a blood clot in her left leg.  She denies chest pain or shortness of breath today.  Reports yesterday, she had blood dripping down her leg and her family became concerned about a blood clot.  She also endorses soreness/tenderness to touch on the medial aspect of the knee and shin bone.  Again, no recent injury to the shin or knee.  Patient is requesting an x-ray today to "check my veins."    Past Medical History:  Diagnosis Date   Anemia    Asthma    Baker's cyst    Cancer (HCC)    skin Ca- ? basal cell    Chronic back pain    Chronic neck pain    Degenerative joint disease    Left shoulder; cervical spine, knees & hands    Gastroesophageal reflux disease    Hiatal hernia; distal esophageal web requiring dilatation; gastric polyps; gastritis; refuses colonoscopy   GERD (gastroesophageal reflux disease)    History of stress test 1990's   stress test done under the care of Dr. Dietrich Pates & Dr. Allyson Sabal, now being followed by Dr. Wyline Mood- in [place] , recently seen & told to f/U in one yr.    Hyperlipidemia 04/27/2011   Hypertension    Hypothyroidism    Lymphocytic thyroiditis    Mild carotid artery disease (HCC)    Multiple thyroid nodules 2010   Adenomatous; thyroidectomy in 2010   Orthostasis    Osteoarthritis of right knee 07/27/2014   Pneumonia    hosp. for pneumonia- long time ago    Primary localized osteoarthrosis of right shoulder 06/21/2015   Seizures (HCC)    yes- as a child- & into adult years, states she took med. for them at one time, stopped at 32 yrs. of age     Syncope and collapse 2007   Possible CVA in 2012 with left lower extremity weakness; refused hospitalization; CT-Atrophy and chronic microvascular ischemic change.     Patient Active Problem List   Diagnosis Date Noted   Constipation 10/10/2022   Protein-calorie malnutrition, mild (HCC) 06/22/2022   Acute bronchitis 06/22/2022   Iron deficiency anemia 06/22/2022   COPD (chronic obstructive pulmonary disease) (HCC) 06/22/2022   Loss of weight 10/09/2021   Pharyngeal dysphagia 10/09/2021   Mild carotid artery disease (HCC) 05/19/2020   Near syncope 12/26/2018   History of TIA (transient ischemic attack) 12/25/2018   Neck pain    Chest pain 09/30/2018   Generalized weakness 09/30/2018   Hypokalemia    MGUS (monoclonal gammopathy of unknown significance) 10/21/2017   Primary localized osteoarthrosis of right shoulder 06/21/2015   S/P shoulder replacement 06/21/2015   Dysphagia, idiopathic 11/29/2014   Osteoarthritis of right knee 07/27/2014   Postsurgical hypothyroidism 04/22/2014   Hypothyroidism 10/12/2013   Syncope and collapse    Anemia, normocytic normochromic 04/26/2012   Gastroesophageal reflux disease    Essential hypertension 12/07/2011   Leg pain, bilateral 04/27/2011   Dyslipidemia 04/27/2011   Other dysphagia 06/07/2010   EPIGASTRIC PAIN 10/13/2009  Past Surgical History:  Procedure Laterality Date   ABDOMINAL HYSTERECTOMY     fibroids   CATARACT EXTRACTION     Bilateral; redo surgery on the right for incomplete primary procedure   COLONOSCOPY  Remote   ESOPHAGOGASTRODUODENOSCOPY  11/2009   Dr. Darrick Penna: probable distal web s/p dilation small hiatal hernia/gastric polyps/mild gastritis, appeared to have narrowing at the junction of D1 and D2 dilated up to 12 mm   ESOPHAGOGASTRODUODENOSCOPY N/A 03/21/2015   Surgeon: West Bali, MD; proximal esophageal web s/p dilation, multiple gastric polyps s/p multiple biopsies, mild erosive gastritis s/p biopsy.   Pathology with fundic gland polyps, chronic gastritis, negative for H. pylori.   EYE SURGERY     PROLAPSED UTERINE FIBROID LIGATION     outcomed with rectocele & cystocele   SAVORY DILATION N/A 03/21/2015   Procedure: SAVORY DILATION;  Surgeon: West Bali, MD;  Location: [facility];  Service: Endoscopy;  Laterality: N/A;   SHOULDER SURGERY     left   TOTAL KNEE ARTHROPLASTY Right 07/27/2014   Procedure: RIGHT TOTAL KNEE ARTHROPLASTY;  Surgeon: Eulas Post, MD;  Location: [facility] OR;  Service: Orthopedics;  Laterality: Right;   TOTAL SHOULDER ARTHROPLASTY Right 06/21/2015   Procedure: RIGHT TOTAL SHOULDER ARTHROPLASTY;  Surgeon: Teryl Lucy, MD;  Location: [facility] OR;  Service: Orthopedics;  Laterality: Right;   TOTAL THYROIDECTOMY  2010    OB History     Gravida      Para      Term      Preterm      AB      Living  4      SAB      IAB      Ectopic      Multiple      Live Births               Home Medications    Prior to Admission medications   Medication Sig Start Date End Date Taking? Authorizing Provider  aspirin EC 81 MG tablet Take 81 mg by mouth daily. Swallow whole.   Yes [provider]  ferrous sulfate 325 (65 FE) MG tablet Take 325 mg by mouth. Takes one on Monday, Wednesday and Friday   Yes [provider]  ipratropium (ATROVENT) 0.06 % nasal spray USE 2 SPRAYS IN [employer] NOSTRIL THREE TIMES A DAY 07/25/22  Yes Alfonse Spruce, MD  levothyroxine (SYNTHROID) 88 MCG tablet TAKE 1 TABLET DAILY ON MONDAY THROUGH SATURDAY AND ONE-HALF (1/2) TABLET ON SUNDAY 10/10/22  Yes Reather Littler, MD  omeprazole (PRILOSEC) 40 MG capsule Take 1 capsule (40 mg total) by mouth 2 (two) times daily before a meal. 05/01/23  Yes Harper, Kristen S, PA-C  OVER THE COUNTER MEDICATION Chest congestion relief and mucus relief dm daily   Yes [provider]  pravastatin (PRAVACHOL) 40 MG tablet TAKE 2 TABLETS DAILY 07/26/22  Yes Strader, [place] M,  PA-C  senna (SENOKOT) 8.6 MG tablet Take 2 tablets by mouth at bedtime.   Yes [provider]  albuterol (VENTOLIN HFA) 108 (90 Base) MCG/ACT inhaler Inhale 1-2 puffs into the lungs every 6 (six) hours as needed for wheezing or shortness of breath. Wheezing, Asthma Symptoms 06/23/22   Amin, Loura Halt, MD  Carbinoxamine Maleate 4 MG TABS Take 1 tablet (4 mg total) by mouth at bedtime. 01/02/23 04/02/23  Alfonse Spruce, MD  HYDROcodone-acetaminophen (NORCO/VICODIN) 5-325 MG tablet Take one tablet by mouth evyer 8 hours as  needed for pain    [provider]    Family History Family History  Problem Relation Age of Onset   Heart disease Mother    Gallbladder disease Mother    Heart failure Mother    Diabetes Mother    Breast cancer Sister        30 years ago   Diabetes Sister    Heart attack Father    Diabetes Maternal Grandmother    Colon cancer Neg Hx    Stroke Neg Hx     Social History Social History   Tobacco Use   Smoking status: Former    Packs/day: 0.80    Years: 20.00    Additional pack years: 0.00    Total pack years: 16.00    Types: Cigarettes    Quit date: 12/31/1978    Years since quitting: 44.3   Smokeless tobacco: Never  Vaping Use   Vaping Use: Never used  Substance Use Topics   Alcohol use: Yes    Comment: rarely   Drug use: No     Allergies   Celecoxib, Dexlansoprazole, and Famotidine   Review of Systems Review of Systems Per HPI  Physical Exam Triage Vital Signs ED Triage Vitals  Enc Vitals Group     BP 05/13/23 1612 (!) 148/70     Pulse Rate 05/13/23 1612 88     Resp 05/13/23 1612 19     Temp 05/13/23 1612 98.1 F (36.7 C)     Temp Source 05/13/23 1612 Oral     SpO2 05/13/23 1612 95 %     Weight --      Height --      Head Circumference --      Peak Flow --      Pain Score 05/13/23 1613 8     Pain Loc --      Pain Edu? --      Excl. in GC? --    No data found.  Updated Vital Signs BP (!) 148/70 (BP  Location: Right Arm)   Pulse 88   Temp 98.1 F (36.7 C) (Oral)   Resp 19   SpO2 95%   Visual Acuity Right Eye Distance:   Left Eye Distance:   Bilateral Distance:    Right Eye Near:   Left Eye Near:    Bilateral Near:     Physical Exam Vitals and nursing note reviewed.  Constitutional:      General: She is not in acute distress.    Appearance: Normal appearance. She is not toxic-appearing.  HENT:     Head: Normocephalic and atraumatic.     Mouth/Throat:     Mouth: Mucous membranes are moist.     Pharynx: Oropharynx is clear.  Pulmonary:     Effort: Pulmonary effort is normal. No respiratory distress.  Musculoskeletal:     Right lower leg: No edema.     Left lower leg: No edema.       Legs:     Comments: Abrasion noted to right tibia with dried blood; ecchymosis noted superiorly and medially to the abrasion.  No erythema, warmth, swelling appreciated of the right lower extremity.  Sensation intact and equal bilaterally.  Strength difficult to assess secondary to pain and fragility.  Skin:    General: Skin is warm and dry.     Capillary Refill: Capillary refill takes less than 2 seconds.     Coloration: Skin is not jaundiced or pale.     Findings: No  erythema.  Neurological:     Mental Status: She is alert and oriented to person, place, and time.      UC Treatments / Results  Labs (all labs ordered are listed, but only abnormal results are displayed) Labs Reviewed - No data to display  EKG   Radiology No results found.  Procedures Procedures (including critical care time)  Medications Ordered in UC Medications - No data to display  Initial Impression / Assessment and Plan / UC Course  I have reviewed the triage vital signs and the nursing notes.  Pertinent labs & imaging results that were available during my care of the patient were reviewed by me and considered in my medical decision making (see chart for details).   Patient is well-appearing,  normotensive, afebrile, not tachycardic, not tachypneic, oxygenating well on room air.    1. Arthralgia of right lower leg Low suspicion for DVT today, however patient is very concerned that she may have: I attempted to provide reassurance I recommended patient go to emergency room if she believes she may have a DVT for ultrasound and further evaluation/management  The patient was given the opportunity to ask questions.  All questions answered to their satisfaction.  The patient is in agreement to this plan.   Final Clinical Impressions(s) / UC Diagnoses   Final diagnoses:  Arthralgia of right lower leg     Discharge Instructions      I do not believe the symptoms you described today are coming from a blood clot in your leg.  If you are concerned about having a blood clot, please go directly to the ER for further evaluation and management.     ED Prescriptions   None    PDMP not reviewed this encounter.   Valentino Nose, NP 05/13/23 [phone]

## 2023-05-14 ENCOUNTER — Ambulatory Visit (HOSPITAL_COMMUNITY)
Admission: RE | Admit: 2023-05-14 | Discharge: 2023-05-14 | Disposition: A | Discharge status: Home / Self Care | Payer: Medicare Other | Source: Ambulatory Visit | Attending: Emergency Medicine | Admitting: Emergency Medicine

## 2023-05-14 ENCOUNTER — Other Ambulatory Visit (HOSPITAL_COMMUNITY): Payer: Self-pay | Admitting: Emergency Medicine

## 2023-05-14 DIAGNOSIS — M79604 Pain in right leg: Secondary | ICD-10-CM | POA: Diagnosis not present

## 2023-05-14 DIAGNOSIS — M7989 Other specified soft tissue disorders: Secondary | ICD-10-CM

## 2023-05-14 DIAGNOSIS — R2241 Localized swelling, mass and lump, right lower limb: Secondary | ICD-10-CM

## 2023-05-14 DIAGNOSIS — M25561 Pain in right knee: Secondary | ICD-10-CM

## 2023-05-14 NOTE — ED Provider Notes (Addendum)
   Patient returned today for scheduled outpatient venous imaging of the right lower extremity  Seen here yesterday for leg pain  Ultrasound negative for presence of DVT.  Patient informed of diagnosis.     US Venous Img Lower Unilateral Right (DVT)  Result Date: 05/14/2023 CLINICAL DATA:  Right lower extremity pain and edema. EXAM: RIGHT LOWER EXTREMITY VENOUS DOPPLER ULTRASOUND TECHNIQUE: Gray-scale sonography with graded compression, as well as color Doppler and duplex ultrasound were performed to evaluate the lower extremity deep venous systems from the level of the common femoral vein and including the common femoral, femoral, profunda femoral, popliteal and calf veins including the posterior tibial, peroneal and gastrocnemius veins when visible. The superficial great saphenous vein was also interrogated. Spectral Doppler was utilized to evaluate flow at rest and with distal augmentation maneuvers in the common femoral, femoral and popliteal veins. COMPARISON:  None Available. FINDINGS: Contralateral Common Femoral Vein: Respiratory phasicity is normal and symmetric with the symptomatic side. No evidence of thrombus. Normal compressibility. Common Femoral Vein: No evidence of thrombus. Normal compressibility, respiratory phasicity and response to augmentation. Saphenofemoral Junction: No evidence of thrombus. Normal compressibility and flow on color Doppler imaging. Profunda Femoral Vein: No evidence of thrombus. Normal compressibility and flow on color Doppler imaging. Femoral Vein: No evidence of thrombus. Normal compressibility, respiratory phasicity and response to augmentation. Popliteal Vein: No evidence of thrombus. Normal compressibility, respiratory phasicity and response to augmentation. Calf Veins: No evidence of thrombus. Normal compressibility and flow on color Doppler imaging. Superficial Great Saphenous Vein: No evidence of thrombus. Normal compressibility. Venous Reflux:  None. Other  Findings: No evidence of superficial thrombophlebitis or abnormal fluid collection. IMPRESSION: No evidence of right lower extremity deep venous thrombosis. Electronically Signed   By: Irish Lack M.D.   On: 05/14/2023 11:09      Pauline Aus, PA-C 05/14/23 1137    Ruxin Ransome, PA-C 05/14/23 1139    Loetta Rough, MD 05/25/23 [phone]

## 2023-05-20 DIAGNOSIS — M1712 Unilateral primary osteoarthritis, left knee: Secondary | ICD-10-CM | POA: Diagnosis not present

## 2023-05-22 ENCOUNTER — Inpatient Hospital Stay: Payer: Medicare Other | Attending: Hematology

## 2023-05-22 DIAGNOSIS — M1712 Unilateral primary osteoarthritis, left knee: Secondary | ICD-10-CM | POA: Insufficient documentation

## 2023-05-22 DIAGNOSIS — Z86718 Personal history of other venous thrombosis and embolism: Secondary | ICD-10-CM | POA: Diagnosis not present

## 2023-05-22 DIAGNOSIS — D649 Anemia, unspecified: Secondary | ICD-10-CM | POA: Diagnosis not present

## 2023-05-22 DIAGNOSIS — Z87891 Personal history of nicotine dependence: Secondary | ICD-10-CM | POA: Diagnosis not present

## 2023-05-22 DIAGNOSIS — D472 Monoclonal gammopathy: Secondary | ICD-10-CM | POA: Insufficient documentation

## 2023-05-22 DIAGNOSIS — D508 Other iron deficiency anemias: Secondary | ICD-10-CM

## 2023-05-22 DIAGNOSIS — Z803 Family history of malignant neoplasm of breast: Secondary | ICD-10-CM | POA: Diagnosis not present

## 2023-05-22 DIAGNOSIS — E611 Iron deficiency: Secondary | ICD-10-CM | POA: Insufficient documentation

## 2023-05-22 DIAGNOSIS — N1831 Chronic kidney disease, stage 3a: Secondary | ICD-10-CM | POA: Insufficient documentation

## 2023-05-22 LAB — CBC WITH DIFFERENTIAL/PLATELET
Abs Immature Granulocytes: 0.01 10*3/uL (ref 0.00–0.07)
Basophils Absolute: 0.1 10*3/uL (ref 0.0–0.1)
Basophils Relative: 1 %
Eosinophils Absolute: 1.2 10*3/uL — ABNORMAL HIGH (ref 0.0–0.5)
Eosinophils Relative: 20 %
HCT: 32.5 % — ABNORMAL LOW (ref 36.0–46.0)
Hemoglobin: 10.3 g/dL — ABNORMAL LOW (ref 12.0–15.0)
Immature Granulocytes: 0 %
Lymphocytes Relative: 17 %
Lymphs Abs: 1.1 10*3/uL (ref 0.7–4.0)
MCH: 29.9 pg (ref 26.0–34.0)
MCHC: 31.7 g/dL (ref 30.0–36.0)
MCV: 94.2 fL (ref 80.0–100.0)
Monocytes Absolute: 0.5 10*3/uL (ref 0.1–1.0)
Monocytes Relative: 8 %
Neutro Abs: 3.4 10*3/uL (ref 1.7–7.7)
Neutrophils Relative %: 54 %
Platelets: 177 10*3/uL (ref 150–400)
RBC: 3.45 MIL/uL — ABNORMAL LOW (ref 3.87–5.11)
RDW: 14.4 % (ref 11.5–15.5)
WBC: 6.2 10*3/uL (ref 4.0–10.5)
nRBC: 0 % (ref 0.0–0.2)

## 2023-05-22 LAB — IRON AND TIBC
Iron: 50 ug/dL (ref 28–170)
Saturation Ratios: 22 % (ref 10.4–31.8)
TIBC: 230 ug/dL — ABNORMAL LOW (ref 250–450)
UIBC: 180 ug/dL

## 2023-05-22 LAB — FERRITIN: Ferritin: 459 ng/mL — ABNORMAL HIGH (ref 11–307)

## 2023-05-28 NOTE — Progress Notes (Deleted)
RESCHEDULED

## 2023-05-29 ENCOUNTER — Inpatient Hospital Stay: Payer: Medicare Other | Admitting: Physician Assistant

## 2023-05-29 DIAGNOSIS — G4700 Insomnia, unspecified: Secondary | ICD-10-CM | POA: Diagnosis not present

## 2023-05-29 DIAGNOSIS — R259 Unspecified abnormal involuntary movements: Secondary | ICD-10-CM | POA: Diagnosis not present

## 2023-05-29 DIAGNOSIS — J019 Acute sinusitis, unspecified: Secondary | ICD-10-CM | POA: Diagnosis not present

## 2023-05-29 NOTE — Progress Notes (Signed)
[facility] [address], [place] [address]   CLINIC:  Medical Oncology/Hematology  PCP:  Benita Stabile, MD [address] Laurey Leblanc [place] [address] [phone]   REASON FOR VISIT:  Follow-up for normocytic anemia + history of DVT + MGUS  PRIOR THERAPY: Oral iron supplement  CURRENT THERAPY: Intermittent IV iron  INTERVAL HISTORY:   [name] 89 y.o. female returns for routine follow-up of normocytic anemia and history of DVT.  She was last seen by Dr. Ellin Saba on 02/21/2023.  At today's visit, she reports feeling fair.  She does have some significant knee pain, left greater than right.  This is causing problems with her mobility, which is resulting in some emotional distress.  She felt no different after her IV iron in February 2024.  She denies any rectal bleeding, melena, epistaxis, or other sources of blood loss.  She has moderate fatigue that she attributes to her knee pain.  She denies any pica, headaches, lightheadedness, or syncope.  She does not have any symptoms of recurrent DVT or new PE.  She denies any new onset bone pain, neurologic changes, or B symptoms.  She continues to take her iron supplement 3 times weekly.  She has 50% energy and 50% appetite. She endorses that she is maintaining a stable weight.  ASSESSMENT & PLAN:  1.  Normocytic anemia (CKD and iron deficiency) - Mild to moderate normocytic anemia since 2019 - History of CKD stage IIIa since 2019 - Hematology workup showed functional iron deficiency with ferritin 228 and percent saturation 19%.  She had normal B12, copper, folic acid, MMA, LDH.  Reticulocytes insufficient for degree of anemia at 1.0%. - No prior history of blood transfusion. - Taking oral iron supplement for >1 year - Received IV iron with Feraheme x 1 on 02/27/2023.  (PREMEDS given due to history of multiple drug allergies.) - Denies rectal bleeding or melena  - Energy at baseline.  No pica.   - Most recent  labs (05/22/2023): Hgb 10.3/MCV 94.2, otherwise normal CBC/D apart from some elevated eosinophils.  Ferritin elevated at 459, with iron saturation 22%. - DIFFERENTIAL DIAGNOSIS favors anemia secondary to CKD stage II/IIIa and functional iron deficiency.  She may also have some underlying bone marrow disorder. - PLAN: Continue ferrous sulfate 325 mg 3 times weekly.  No indication for IV iron at this time. - We will continue to monitor and consider starting ESA if she has Hgb <9.5 despite adequate iron stores. - Joint decision-making discussion with patient and son, and we will defer any bone marrow biopsy at this time due to her advanced age and relatively mild degree of anemia.  If she has worsening anemia in the future, we would consider starting ESA, and would reevaluate need for bone marrow biopsy if anemia was unresponsive to ESA.  2.   Resolved chronic DVT: - Left leg Doppler (11/01/2022): Chronic appearing DVT in the left profundofemoral vein. - She used Eliquis for 10 days. - Left leg Doppler (11/12/2022): No evidence of femoral-popliteal DVT or superficial phlebitis. - Repeat left leg Doppler (12/07/2022): No evidence of DVT.  Left popliteal fossa Baker's cyst. - She denies any current symptoms concerning for DVT or PE.    3.   IgG kappa monoclonal gammopathy: - Initially evaluated at [facility] for MGUS in June 2018, but was lost to follow-up after December 2020. - Noted again during workup of anemia with initial labs (02/15/2023): Immunofixation shows IgG kappa SPEP  with M spike 0.2.  (stable compared to 2020) Elevated kappa free light chain 63.9, elevated lambda 28.5, mildly elevated free light chain ratio 2.24. - No apparent CRAB features at this time.  Although she does have some anemia, it is mild and considered more likely due to other factors. - We have discussed diagnosis of MGUS including the small but statistically significant risk of progression to multiple myeloma. - PLAN:  Repeat MGUS labs in 3 months (August 2024) along with skeletal survey and 24-hour urine/UPEP/UIFE.    4.  Social/family history: - Lives at home with her disabled daughter.  Activities are limited by left knee pain from arthritis.  Uses walker for ambulation.  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  Quit smoking 50 years ago. - Sister had breast cancer.  PLAN SUMMARY: >> Labs in 3 months = CBC/D, CMP, LDH, SPEP, light chains, ferritin, iron/TIBC >> 24-hour urine/UPEP/UIFE >> SKELETAL SURVEY (whole body x-rays) in 3 months >> OFFICE visit in 3 months (1 week after labs/x-ray)     REVIEW OF SYSTEMS:   Review of Systems  Constitutional:  Positive for fatigue. Negative for appetite change, chills, diaphoresis, fever and unexpected weight change.  HENT:   Positive for trouble swallowing. Negative for lump/mass and nosebleeds.   Eyes:  Negative for eye problems.  Respiratory:  Positive for cough (bronchitis). Negative for hemoptysis and shortness of breath.   Cardiovascular:  Positive for chest pain (from coughing). Negative for leg swelling and palpitations.  Gastrointestinal:  Negative for abdominal pain, blood in stool, constipation, diarrhea, nausea and vomiting.  Genitourinary:  Negative for hematuria.   Musculoskeletal:  Positive for arthralgias.  Skin: Negative.   Neurological:  Negative for dizziness, headaches and light-headedness.  Hematological:  Does not bruise/bleed easily.     PHYSICAL EXAM:  ECOG PERFORMANCE STATUS: 2 - Symptomatic, <50% confined to bed  There were no vitals filed for this visit. There were no vitals filed for this visit. Physical Exam Constitutional:      Appearance: Normal appearance. She is underweight.     Comments: Somewhat thin and frail-appearing  Cardiovascular:     Heart sounds: Normal heart sounds.  Pulmonary:     Breath sounds: Normal breath sounds. Decreased air movement present.  Neurological:     General: No  focal deficit present.     Mental Status: Mental status is at baseline.  Psychiatric:        Behavior: Behavior normal. Behavior is cooperative.     PAST MEDICAL/SURGICAL HISTORY:  Past Medical History:  Diagnosis Date   Anemia    Asthma    Baker's cyst    Cancer (HCC)    skin Ca- ? basal cell    Chronic back pain    Chronic neck pain    Degenerative joint disease    Left shoulder; cervical spine, knees & hands    Gastroesophageal reflux disease    Hiatal hernia; distal esophageal web requiring dilatation; gastric polyps; gastritis; refuses colonoscopy   GERD (gastroesophageal reflux disease)    History of stress test 1990's   stress test done under the care of Dr. Dietrich Pates & Dr. Allyson Sabal, now being followed by Dr. Wyline Mood- in [place] , recently seen & told to f/U in one yr.    Hyperlipidemia 04/27/2011   Hypertension    Hypothyroidism    Lymphocytic thyroiditis    Mild carotid artery disease (HCC)    Multiple thyroid nodules 2010   Adenomatous; thyroidectomy in  2010   Orthostasis    Osteoarthritis of right knee 07/27/2014   Pneumonia    hosp. for pneumonia- long time ago    Primary localized osteoarthrosis of right shoulder 06/21/2015   Seizures (HCC)    yes- as a child- & into adult years, states she took med. for them at one time, stopped at 32 yrs. of age    Syncope and collapse 2007   Possible CVA in 2012 with left lower extremity weakness; refused hospitalization; CT-Atrophy and chronic microvascular ischemic change.    Past Surgical History:  Procedure Laterality Date   ABDOMINAL HYSTERECTOMY     fibroids   CATARACT EXTRACTION     Bilateral; redo surgery on the right for incomplete primary procedure   COLONOSCOPY  Remote   ESOPHAGOGASTRODUODENOSCOPY  11/2009   Dr. Darrick Penna: probable distal web s/p dilation small hiatal hernia/gastric polyps/mild gastritis, appeared to have narrowing at the junction of D1 and D2 dilated up to 12 mm   ESOPHAGOGASTRODUODENOSCOPY N/A  03/21/2015   Surgeon: West Bali, MD; proximal esophageal web s/p dilation, multiple gastric polyps s/p multiple biopsies, mild erosive gastritis s/p biopsy.  Pathology with fundic gland polyps, chronic gastritis, negative for H. pylori.   EYE SURGERY     PROLAPSED UTERINE FIBROID LIGATION     outcomed with rectocele & cystocele   SAVORY DILATION N/A 03/21/2015   Procedure: SAVORY DILATION;  Surgeon: West Bali, MD;  Location: [facility];  Service: Endoscopy;  Laterality: N/A;   SHOULDER SURGERY     left   TOTAL KNEE ARTHROPLASTY Right 07/27/2014   Procedure: RIGHT TOTAL KNEE ARTHROPLASTY;  Surgeon: Eulas Post, MD;  Location: [facility] OR;  Service: Orthopedics;  Laterality: Right;   TOTAL SHOULDER ARTHROPLASTY Right 06/21/2015   Procedure: RIGHT TOTAL SHOULDER ARTHROPLASTY;  Surgeon: Teryl Lucy, MD;  Location: [facility] OR;  Service: Orthopedics;  Laterality: Right;   TOTAL THYROIDECTOMY  2010    SOCIAL HISTORY:  Social History   Socioeconomic History   Marital status: Widowed    Spouse name: Not on file   Number of children: 4   Years of education: 8   Highest education level: Not on file  Occupational History   Occupation: Artist    Comment: does not yield regular income   Occupation: Retired    Comment: Engineer, civil (consulting)  Tobacco Use   Smoking status: Former    Packs/day: 0.80    Years: 20.00    Additional pack years: 0.00    Total pack years: 16.00    Types: Cigarettes    Quit date: 12/31/1978    Years since quitting: 44.4   Smokeless tobacco: Never  Vaping Use   Vaping Use: Never used  Substance and Sexual Activity   Alcohol use: Yes    Comment: rarely   Drug use: No   Sexual activity: Never    Birth control/protection: Surgical    Comment: widowed since 2010  Other Topics Concern   Not on file  Social History Narrative   ** Merged History Encounter **       Lives at home. Her daughter lives with her.  Caffeine use: 1 cup coffee per day    Social Determinants  of Health   Financial Resource Strain: Not on file  Food Insecurity: Not on file  Transportation Needs: Not on file  Physical Activity: Not on file  Stress: Not on file  Social Connections: Not on file  Intimate Partner Violence: Not on file  FAMILY HISTORY:  Family History  Problem Relation Age of Onset   Heart disease Mother    Gallbladder disease Mother    Heart failure Mother    Diabetes Mother    Breast cancer Sister        30 years ago   Diabetes Sister    Heart attack Father    Diabetes Maternal Grandmother    Colon cancer Neg Hx    Stroke Neg Hx     CURRENT MEDICATIONS:  Outpatient Encounter Medications as of 05/30/2023  Medication Sig   albuterol (VENTOLIN HFA) 108 (90 Base) MCG/ACT inhaler Inhale 1-2 puffs into the lungs every 6 (six) hours as needed for wheezing or shortness of breath. Wheezing, Asthma Symptoms   aspirin EC 81 MG tablet Take 81 mg by mouth daily. Swallow whole.   Carbinoxamine Maleate 4 MG TABS Take 1 tablet (4 mg total) by mouth at bedtime.   ferrous sulfate 325 (65 FE) MG tablet Take 325 mg by mouth. Takes one on Monday, Wednesday and Friday   guaiFENesin (MUCINEX) 600 MG 12 hr tablet Take 600 mg by mouth 2 (two) times daily.   HYDROcodone-acetaminophen (NORCO/VICODIN) 5-325 MG tablet Take one tablet by mouth evyer 8 hours as needed for pain   ipratropium (ATROVENT) 0.06 % nasal spray USE 2 SPRAYS IN EACH NOSTRIL THREE TIMES A DAY   levothyroxine (SYNTHROID) 88 MCG tablet TAKE 1 TABLET DAILY ON MONDAY THROUGH SATURDAY AND ONE-HALF (1/2) TABLET ON SUNDAY   omeprazole (PRILOSEC) 40 MG capsule Take 1 capsule (40 mg total) by mouth 2 (two) times daily before a meal.   OVER THE COUNTER MEDICATION Chest congestion relief and mucus relief dm daily   pravastatin (PRAVACHOL) 40 MG tablet TAKE 2 TABLETS DAILY   senna (SENOKOT) 8.6 MG tablet Take 2 tablets by mouth at bedtime.   No facility-administered encounter medications on file as of 05/30/2023.     ALLERGIES:  Allergies  Allergen Reactions   Celecoxib Shortness Of Breath   Dexlansoprazole Anaphylaxis and Other (See Comments)    abd pain   Famotidine Other (See Comments)    Makes her feel "very bad"    LABORATORY DATA:  I have reviewed the labs as listed.  CBC    Component Value Date/Time   WBC 6.2 05/22/2023 1406   RBC 3.45 (L) 05/22/2023 1406   HGB 10.3 (L) 05/22/2023 1406   HGB 11.2 05/01/2016 1604   HCT 32.5 (L) 05/22/2023 1406   HCT 33.8 (L) 05/01/2016 1604   PLT 177 05/22/2023 1406   PLT 181 05/01/2016 1604   MCV 94.2 05/22/2023 1406   MCV 89 05/01/2016 1604   MCH 29.9 05/22/2023 1406   MCHC 31.7 05/22/2023 1406   RDW 14.4 05/22/2023 1406   RDW 14.2 05/01/2016 1604   LYMPHSABS 1.1 05/22/2023 1406   LYMPHSABS 1.6 05/01/2016 1604   MONOABS 0.5 05/22/2023 1406   EOSABS 1.2 (H) 05/22/2023 1406   EOSABS 0.7 (H) 05/01/2016 1604   BASOSABS 0.1 05/22/2023 1406   BASOSABS 0.0 05/01/2016 1604      Latest Ref Rng & Units 03/18/2023    7:16 PM 06/23/2022    4:54 AM 06/22/2022    5:24 AM  CMP  Glucose 70 - 99 mg/dL 97  91  161   BUN 8 - 23 mg/dL 20  19  25    Creatinine 0.44 - 1.00 mg/dL 0.96  0.45  4.09   Sodium 135 - 145 mmol/L 137  139  137  Potassium 3.5 - 5.1 mmol/L 3.8  3.6  3.6   Chloride 98 - 111 mmol/L 103  112  106   CO2 22 - 32 mmol/L 27  20  25    Calcium 8.9 - 10.3 mg/dL 8.9  8.4  8.4   Total Protein 6.5 - 8.1 g/dL   6.1   Total Bilirubin 0.3 - 1.2 mg/dL   0.1   Alkaline Phos 38 - 126 U/L   45   AST 15 - 41 U/L   18   ALT 0 - 44 U/L   11     DIAGNOSTIC IMAGING:  I have independently reviewed the relevant imaging and discussed with the patient.   WRAP UP:  All questions were answered. The patient knows to call the clinic with any problems, questions or concerns.  Medical decision making: Moderate  Time spent on visit: I spent 25 minutes counseling the patient face to face. The total time spent in the appointment was 40 minutes and more  than 50% was on counseling.  Carnella Guadalajara, PA-C  05/30/23 6:28 PM

## 2023-05-30 ENCOUNTER — Inpatient Hospital Stay (HOSPITAL_BASED_OUTPATIENT_CLINIC_OR_DEPARTMENT_OTHER): Payer: Medicare Other | Admitting: Physician Assistant

## 2023-05-30 VITALS — BP 130/78 | HR 102 | Temp 98.3°F | Resp 18 | Wt 98.0 lb

## 2023-05-30 DIAGNOSIS — N1831 Chronic kidney disease, stage 3a: Secondary | ICD-10-CM | POA: Diagnosis not present

## 2023-05-30 DIAGNOSIS — D649 Anemia, unspecified: Secondary | ICD-10-CM | POA: Diagnosis not present

## 2023-05-30 DIAGNOSIS — D472 Monoclonal gammopathy: Secondary | ICD-10-CM

## 2023-05-30 DIAGNOSIS — E611 Iron deficiency: Secondary | ICD-10-CM | POA: Diagnosis not present

## 2023-05-30 DIAGNOSIS — D509 Iron deficiency anemia, unspecified: Secondary | ICD-10-CM | POA: Diagnosis not present

## 2023-05-30 DIAGNOSIS — Z86718 Personal history of other venous thrombosis and embolism: Secondary | ICD-10-CM | POA: Diagnosis not present

## 2023-05-30 DIAGNOSIS — M1712 Unilateral primary osteoarthritis, left knee: Secondary | ICD-10-CM | POA: Diagnosis not present

## 2023-05-30 NOTE — Patient Instructions (Signed)
[facility] at [facility] **VISIT SUMMARY & IMPORTANT INSTRUCTIONS **   You were seen today by Rojelio Brenner PA-C for your follow-up visit.    MGUS ("monoclonal gammopathy of undetermined significance") As we discussed, this is a "precancerous" condition where you have slightly increased plasma cells making a slightly increased amount of abnormal immunoglobulin proteins. Although MGUS is not causing any current problems in your body, it has a 1% each year risk of progression to multiple myeloma cancer. We will continue to monitor your labs every 6 months and check whole-body x-rays once a year (next due around August/September 2024) We will also check 24-hour urine study around August/September 2024. Please see the attached handout for further information regarding MGUS.   ANEMIA Your anemia may be related to your chronic kidney disease (mild age-related decrease in kidney function). It is also possible that you may have some underlying bone marrow disorder which is causing anemia. Regardless of the cause of your anemia, it is mild and stable at this time.  We will continue monitoring of your anemia, but would consider additional testing or treatment if you had a severe worsening of your anemia (Hb <10.0).  LABS: Return in 3 months for repeat labs  OTHER TESTS: When you return for labs in 3 months, you will also need to - Pick up kit for 24-hour urine collection - Stop at radiology department for skeletal survey (whole body x-rays)  MEDICATIONS: Continue iron supplement 3 times each week  FOLLOW-UP APPOINTMENT: Office visit in 3 months (1 week after labs and x-rays)  ** Thank you for trusting me with your healthcare!  I strive to provide all of my patients with quality care at each visit.  If you receive a survey for this visit, I would be so grateful to you for taking the time to provide feedback.  Thank you in advance!  ~ Ariannie Penaloza                   Dr.  Doreatha Massed   &   Rojelio Brenner, PA-C   - - - - - - - - - - - - - - - - - -    Thank you for choosing [facility] at [facility] to provide your oncology and hematology care.  To afford each patient quality time with our provider, please arrive at least 15 minutes before your scheduled appointment time.   If you have a lab appointment with the Cancer Center please come in thru the Main Entrance and check in at the main information desk.  You need to re-schedule your appointment should you arrive 10 or more minutes late.  We strive to give you quality time with our providers, and arriving late affects you and other patients whose appointments are after yours.  Also, if you no show three or more times for appointments you may be dismissed from the clinic at the providers discretion.     Again, thank you for choosing [facility].  Our hope is that these requests will decrease the amount of time that you wait before being seen by our physicians.       _____________________________________________________________  Should you have questions after your visit to [facility], please contact our office at [phone] and follow the prompts.  Our office hours are 8:00 a.m. and 4:30 p.m. Monday - Friday.  Please note that voicemails left after 4:00 p.m. may not be  returned until the following business day.  We are closed weekends and major holidays.  You do have access to a nurse 24-7, just call the main number to the clinic [phone] and do not press any options, hold on the line and a nurse will answer the phone.    For prescription refill requests, have your pharmacy contact our office and allow 72 hours.

## 2023-06-07 DIAGNOSIS — Z0000 Encounter for general adult medical examination without abnormal findings: Secondary | ICD-10-CM | POA: Diagnosis not present

## 2023-06-10 DIAGNOSIS — G4700 Insomnia, unspecified: Secondary | ICD-10-CM | POA: Diagnosis not present

## 2023-06-10 DIAGNOSIS — N1831 Chronic kidney disease, stage 3a: Secondary | ICD-10-CM | POA: Diagnosis not present

## 2023-06-10 DIAGNOSIS — E782 Mixed hyperlipidemia: Secondary | ICD-10-CM | POA: Diagnosis not present

## 2023-06-10 DIAGNOSIS — Z9189 Other specified personal risk factors, not elsewhere classified: Secondary | ICD-10-CM | POA: Diagnosis not present

## 2023-06-10 DIAGNOSIS — Z713 Dietary counseling and surveillance: Secondary | ICD-10-CM | POA: Diagnosis not present

## 2023-06-10 DIAGNOSIS — E43 Unspecified severe protein-calorie malnutrition: Secondary | ICD-10-CM | POA: Diagnosis not present

## 2023-06-10 DIAGNOSIS — D649 Anemia, unspecified: Secondary | ICD-10-CM | POA: Diagnosis not present

## 2023-06-10 DIAGNOSIS — R1310 Dysphagia, unspecified: Secondary | ICD-10-CM | POA: Diagnosis not present

## 2023-06-10 DIAGNOSIS — K219 Gastro-esophageal reflux disease without esophagitis: Secondary | ICD-10-CM | POA: Diagnosis not present

## 2023-06-10 DIAGNOSIS — R7301 Impaired fasting glucose: Secondary | ICD-10-CM | POA: Diagnosis not present

## 2023-06-10 DIAGNOSIS — M7989 Other specified soft tissue disorders: Secondary | ICD-10-CM | POA: Diagnosis not present

## 2023-06-10 DIAGNOSIS — R259 Unspecified abnormal involuntary movements: Secondary | ICD-10-CM | POA: Diagnosis not present

## 2023-06-19 ENCOUNTER — Encounter (HOSPITAL_COMMUNITY): Payer: Self-pay | Admitting: Emergency Medicine

## 2023-06-19 ENCOUNTER — Ambulatory Visit: Payer: Medicare Other | Admitting: Student

## 2023-06-19 ENCOUNTER — Emergency Department (HOSPITAL_COMMUNITY)
Admission: EM | Admit: 2023-06-19 | Discharge: 2023-06-20 | Disposition: A | Discharge status: Home / Self Care | Payer: Medicare Other | Attending: Emergency Medicine | Admitting: Emergency Medicine

## 2023-06-19 ENCOUNTER — Other Ambulatory Visit: Payer: Self-pay

## 2023-06-19 ENCOUNTER — Emergency Department (HOSPITAL_COMMUNITY): Payer: Medicare Other

## 2023-06-19 DIAGNOSIS — Z7982 Long term (current) use of aspirin: Secondary | ICD-10-CM | POA: Diagnosis not present

## 2023-06-19 DIAGNOSIS — R1314 Dysphagia, pharyngoesophageal phase: Secondary | ICD-10-CM | POA: Insufficient documentation

## 2023-06-19 DIAGNOSIS — I10 Essential (primary) hypertension: Secondary | ICD-10-CM | POA: Diagnosis not present

## 2023-06-19 DIAGNOSIS — I700 Atherosclerosis of aorta: Secondary | ICD-10-CM | POA: Diagnosis not present

## 2023-06-19 DIAGNOSIS — R1319 Other dysphagia: Secondary | ICD-10-CM

## 2023-06-19 DIAGNOSIS — R1310 Dysphagia, unspecified: Secondary | ICD-10-CM | POA: Diagnosis not present

## 2023-06-19 MED ORDER — LIDOCAINE VISCOUS HCL 2 % MT SOLN
15.0000 mL | Freq: Once | OROMUCOSAL | Status: AC
  Administered 2023-06-19: 15 mL via ORAL
  Filled 2023-06-19: qty 15

## 2023-06-19 MED ORDER — ALUM & MAG HYDROXIDE-SIMETH 200-200-20 MG/5ML PO SUSP
30.0000 mL | Freq: Once | ORAL | Status: AC
  Administered 2023-06-19: 30 mL via ORAL
  Filled 2023-06-19: qty 30

## 2023-06-19 NOTE — Progress Notes (Deleted)
Cardiology Office Note    Date:  06/19/2023  ID:  Christine Leblanc, DOB 1929-10-20, MRN [id number] Cardiologist: Dina Rich, MD    History of Present Illness:    Christine Leblanc is a 89 y.o. female with past medical history of HLD, hypothyroidism, palpitations (PAC's and PVC's by prior monitor), Stage III CKD, GERD, history of TIA, history of DVT and hiatal hernia who presents to the office today for 35-month follow-up.  She was examined by Dr. Wyline Mood in 11/2022 and reported worsening lower extremity edema and recent doppler showed no evidence of DVT but was noted to have a Baker's cyst. Her edema was felt to not be cardiac related and was likely related to her Baker's cyst and recent aspiration of her knee.  She was encouraged to follow-up with Orthopedics and if no improvement, could consider referral to Vascular Surgery.  In the interim, the office was contacted in 12/2022 in regards to cardiac clearance for upcoming total knee replacement. She was cleared to proceed from a cardiac perspective. She did meet with Hematology in regards to her normocytic anemia and was continued on iron supplementation. She did not require reinitiation of Eliquis for history of DVT as this had resolved by most recent imaging. She also met with Vascular Surgery as well and her lower extremity and was felt to be due to chronic venous insufficiency and was felt that her Baker's cyst could likely be contributing to her swelling. Was informed to follow-up as needed.   ROS: ***  Studies Reviewed:   EKG: EKG is*** ordered today and demonstrates ***  Event Monitor: 05/2020 30 day event monitor Min HR 61, Avg HR 84, Max HR 129 Reported symptoms correlated with normal sinus rhythm No significant arrhythmias  Echocardiogram: 05/2020 IMPRESSIONS     1. Left ventricular ejection fraction, by estimation, is 70 to 75%. The  left ventricle has hyperdynamic function. The left ventricle has no  regional wall motion  abnormalities. There is mild left ventricular  hypertrophy. Left ventricular diastolic  parameters are indeterminate.   2. Right ventricular systolic function is normal. The right ventricular  size is normal. There is normal pulmonary artery systolic pressure. The  estimated right ventricular systolic pressure is 20.0 mmHg.   3. The mitral valve is grossly normal. Trivial mitral valve  regurgitation.   4. The aortic valve is tricuspid. Aortic valve regurgitation is not  visualized. Mild aortic valve sclerosis is present, with no evidence of  aortic valve stenosis.   5. The inferior vena cava is normal in size with greater than 50%  respiratory variability, suggesting right atrial pressure of 3 mmHg.   Risk Assessment/Calculations:   {Does this patient have ATRIAL FIBRILLATION?:[id number]} No BP recorded.  {Refresh Note OR Click here to enter BP  :1}***         Physical Exam:   VS:  There were no vitals taken for this visit.   Wt Readings from Last 3 Encounters:  05/30/23 98 lb (44.5 kg)  05/13/23 98 lb 6.3 oz (44.6 kg)  05/01/23 98 lb 6.4 oz (44.6 kg)     GEN: Well nourished, well developed in no acute distress NECK: No JVD; No carotid bruits CARDIAC: ***RRR, no murmurs, rubs, gallops RESPIRATORY:  Clear to auscultation without rales, wheezing or rhonchi  ABDOMEN: Appears non-distended. No obvious abdominal masses. EXTREMITIES: No clubbing or cyanosis. No edema.  Distal pedal pulses are 2+ bilaterally.   Assessment and Plan:   1. Lower Extremity Edema - ***  2. HLD - Followed by her PCP. FLP in 04/2023 showed total cholesterol 145, triglycerides 61, HDL 59 and LDL 73.  3. Palpitations - She did have PAC's and PVC's by prior monitor. ***  4. Stage 3 CKD - Creatinine was at 1.01 when checked in 04/2023.  5. Anemia - Hemoglobin was at 10.3 when checked in 05/2023 which is close to her known baseline.   Signed, Ellsworth Lennox, PA-C

## 2023-06-19 NOTE — ED Triage Notes (Signed)
Pt to ed pov c/o mucus reflux causing pt to choke. Pt fearful that they are going to "choke to death".  Pt also states has trouble eating/drinking.

## 2023-06-20 DIAGNOSIS — R1314 Dysphagia, pharyngoesophageal phase: Secondary | ICD-10-CM | POA: Diagnosis not present

## 2023-06-20 LAB — CBC WITH DIFFERENTIAL/PLATELET
Abs Immature Granulocytes: 0.01 10*3/uL (ref 0.00–0.07)
Basophils Absolute: 0.1 10*3/uL (ref 0.0–0.1)
Basophils Relative: 1 %
Eosinophils Absolute: 1 10*3/uL — ABNORMAL HIGH (ref 0.0–0.5)
Eosinophils Relative: 12 %
HCT: 31.9 % — ABNORMAL LOW (ref 36.0–46.0)
Hemoglobin: 10.2 g/dL — ABNORMAL LOW (ref 12.0–15.0)
Immature Granulocytes: 0 %
Lymphocytes Relative: 23 %
Lymphs Abs: 1.8 10*3/uL (ref 0.7–4.0)
MCH: 30.1 pg (ref 26.0–34.0)
MCHC: 32 g/dL (ref 30.0–36.0)
MCV: 94.1 fL (ref 80.0–100.0)
Monocytes Absolute: 0.6 10*3/uL (ref 0.1–1.0)
Monocytes Relative: 8 %
Neutro Abs: 4.4 10*3/uL (ref 1.7–7.7)
Neutrophils Relative %: 56 %
Platelets: 209 10*3/uL (ref 150–400)
RBC: 3.39 MIL/uL — ABNORMAL LOW (ref 3.87–5.11)
RDW: 13.4 % (ref 11.5–15.5)
WBC: 7.8 10*3/uL (ref 4.0–10.5)
nRBC: 0 % (ref 0.0–0.2)

## 2023-06-20 LAB — COMPREHENSIVE METABOLIC PANEL
ALT: 12 U/L (ref 0–44)
AST: 17 U/L (ref 15–41)
Albumin: 3.5 g/dL (ref 3.5–5.0)
Alkaline Phosphatase: 65 U/L (ref 38–126)
Anion gap: 7 (ref 5–15)
BUN: 16 mg/dL (ref 8–23)
CO2: 26 mmol/L (ref 22–32)
Calcium: 9 mg/dL (ref 8.9–10.3)
Chloride: 101 mmol/L (ref 98–111)
Creatinine, Ser: 1.04 mg/dL — ABNORMAL HIGH (ref 0.44–1.00)
GFR, Estimated: 51 mL/min — ABNORMAL LOW (ref >60)
Glucose, Bld: 104 mg/dL — ABNORMAL HIGH (ref 70–99)
Potassium: 3.6 mmol/L (ref 3.5–5.1)
Sodium: 134 mmol/L — ABNORMAL LOW (ref 135–145)
Total Bilirubin: 0.8 mg/dL (ref 0.3–1.2)
Total Protein: 7.1 g/dL (ref 6.5–8.1)

## 2023-06-20 LAB — LIPASE, BLOOD: Lipase: 34 U/L (ref 11–51)

## 2023-06-20 MED ORDER — PANTOPRAZOLE SODIUM 40 MG IV SOLR
40.0000 mg | Freq: Once | INTRAVENOUS | Status: AC
  Administered 2023-06-20: 40 mg via INTRAVENOUS
  Filled 2023-06-20: qty 10

## 2023-06-20 MED ORDER — LACTATED RINGERS IV BOLUS
1000.0000 mL | Freq: Once | INTRAVENOUS | Status: AC
  Administered 2023-06-20: 1000 mL via INTRAVENOUS

## 2023-06-20 MED ORDER — SUCRALFATE 1 GM/10ML PO SUSP: 1.0000 g | Freq: Three times a day (TID) | ORAL | 0 refills | Status: DC

## 2023-06-20 MED ORDER — SUCRALFATE 1 GM/10ML PO SUSP: 1.0000 g | Freq: Three times a day (TID) | ORAL | Status: DC

## 2023-06-20 NOTE — ED Notes (Signed)
Nurse called Robbie Lis to change carafate from liquid form to pill form. 06/20/23@1630 

## 2023-06-20 NOTE — ED Provider Notes (Signed)
[place] EMERGENCY DEPARTMENT AT [facility] Provider Note   CSN: [id number] Arrival date & time: 06/19/23  2104     History  Chief Complaint  Patient presents with   Choking    R/t mucus reflux    Christine Leblanc is a 89 y.o. female.  89 yo F w/ h/o esophageal dysphagia, GERD and esophageal webs s/p dilation (probably at her last EGD in 2016) here with worsening episodes of choking and coughing tonight to the point she can't take her medications. She thinks it is mucous and has been trying sudafed. Son thinks it might be stomach contents. Saw GI a couple months ago. Still doing pureed diet. Gained some weight back recently.         Home Medications Prior to Admission medications   Medication Sig Start Date End Date Taking? Authorizing Provider  sucralfate (CARAFATE) 1 GM/10ML suspension Take 10 mLs (1 g total) by mouth 4 (four) times daily -  with meals and at bedtime. 06/20/23  Yes Rilan Eiland, Barbara Cower, MD  albuterol (VENTOLIN HFA) 108 (90 Base) MCG/ACT inhaler Inhale 1-2 puffs into the lungs every 6 (six) hours as needed for wheezing or shortness of breath. Wheezing, Asthma Symptoms 06/23/22   Dimple Nanas, MD  aspirin EC 81 MG tablet Take 81 mg by mouth daily. Swallow whole.    [provider]  azithromycin (ZITHROMAX) 250 MG tablet TAKE 2 TABLETS (500 MG) BY ORAL ROUTE ONCE DAILY FOR 1 DAY THEN 1 TABLET (250 MG) BY ORAL ROUTE ONCE DAILY FOR 4 DAYS 05/29/23   [provider]  Carbinoxamine Maleate 4 MG TABS Take 1 tablet (4 mg total) by mouth at bedtime. 01/02/23 04/02/23  Alfonse Spruce, MD  chlorhexidine (PERIDEX) 0.12 % solution 15 mLs 2 (two) times daily. 05/10/23   [provider]  diazepam (VALIUM) 5 MG tablet Take 5 mg by mouth daily as needed. 05/29/23   [provider]  ferrous sulfate 325 (65 FE) MG tablet Take 325 mg by mouth. Takes one on Monday, Wednesday and Friday    [provider]  guaiFENesin (MUCINEX) 600  MG 12 hr tablet Take 600 mg by mouth 2 (two) times daily.    [provider]  HYDROcodone-acetaminophen (NORCO/VICODIN) 5-325 MG tablet Take one tablet by mouth evyer 8 hours as needed for pain    [provider]  ipratropium (ATROVENT) 0.06 % nasal spray USE 2 SPRAYS IN [facility] NOSTRIL THREE TIMES A DAY 07/25/22   Alfonse Spruce, MD  levothyroxine (SYNTHROID) 88 MCG tablet TAKE 1 TABLET DAILY ON MONDAY THROUGH SATURDAY AND ONE-HALF (1/2) TABLET ON SUNDAY 10/10/22   Reather Littler, MD  omeprazole (PRILOSEC) 40 MG capsule Take 1 capsule (40 mg total) by mouth 2 (two) times daily before a meal. 05/01/23   Harper, Gwendalyn Ege, PA-C  OVER THE COUNTER MEDICATION Chest congestion relief and mucus relief dm daily    [provider]  pravastatin (PRAVACHOL) 40 MG tablet TAKE 2 TABLETS DAILY 07/26/22   Iran Ouch, [place] M, PA-C  senna (SENOKOT) 8.6 MG tablet Take 2 tablets by mouth at bedtime.    [provider]      Allergies    Celecoxib, Dexlansoprazole, and Famotidine    Review of Systems   Review of Systems  Physical Exam Updated Vital Signs BP (!) 179/92   Pulse 90   Temp 97.6 F (36.4 C) (Oral)   Resp 14   Ht 5\' 1"  (1.549 m)  Wt 44.5 kg   SpO2 97%   BMI 18.52 kg/m  Physical Exam Vitals and nursing note reviewed.  Constitutional:      Appearance: She is well-developed.     Comments: Intermittent coughing and choking  HENT:     Head: Normocephalic and atraumatic.  Eyes:     Pupils: Pupils are equal, round, and reactive to light.  Cardiovascular:     Rate and Rhythm: Normal rate and regular rhythm.  Pulmonary:     Effort: No respiratory distress.     Breath sounds: No stridor.  Abdominal:     General: Abdomen is flat. There is no distension.     Tenderness: There is no abdominal tenderness.  Musculoskeletal:        General: No swelling or tenderness. Normal range of motion.     Cervical back: Normal range of motion.  Skin:    General: Skin  is warm and dry.  Neurological:     General: No focal deficit present.     Mental Status: She is alert.     ED Results / Procedures / Treatments   Labs (all labs ordered are listed, but only abnormal results are displayed) Labs Reviewed  CBC WITH DIFFERENTIAL/PLATELET - Abnormal; Notable for the following components:      Result Value   RBC 3.39 (*)    Hemoglobin 10.2 (*)    HCT 31.9 (*)    Eosinophils Absolute 1.0 (*)    All other components within normal limits  COMPREHENSIVE METABOLIC PANEL - Abnormal; Notable for the following components:   Sodium 134 (*)    Glucose, Bld 104 (*)    Creatinine, Ser 1.04 (*)    GFR, Estimated 51 (*)    All other components within normal limits  LIPASE, BLOOD    EKG EKG Interpretation  Date/Time:  Thursday June 20 2023 00:04:10 EDT Ventricular Rate:  90 PR Interval:  196 QRS Duration: 133 QT Interval:  407 QTC Calculation: 498 R Axis:   94 Text Interpretation: Sinus rhythm Prominent P waves, nondiagnostic RBBB and LPFB Minimal ST elevation, lateral leads Baseline wander in lead(s) I II aVR Confirmed by Marily Memos [phone]) on 06/20/2023 2:19:07 AM  Radiology DG Chest 2 View  Result Date: 06/19/2023 CLINICAL DATA:  Choking. EXAM: CHEST - 2 VIEW COMPARISON:  Chest radiograph dated 04/19/2023. FINDINGS: No focal consolidation, pleural effusion, pneumothorax. The cardiac silhouette is within limits. Atherosclerotic calcification of the aorta. No acute osseous pathology. Osteopenia with degenerative changes of the spine. Bilateral shoulder arthroplasties. Thyroidectomy surgical clips. IMPRESSION: No active cardiopulmonary disease. Electronically Signed   By: Elgie Collard M.D.   On: 06/19/2023 23:52    Procedures Procedures    Medications Ordered in ED Medications  sucralfate (CARAFATE) 1 GM/10ML suspension 1 g (has no administration in time range)  alum & mag hydroxide-simeth (MAALOX/MYLANTA) 200-200-20 MG/5ML suspension 30 mL (30  mLs Oral Given 06/19/23 2345)    And  lidocaine (XYLOCAINE) 2 % viscous mouth solution 15 mL (15 mLs Oral Given 06/19/23 2345)  pantoprazole (PROTONIX) injection 40 mg (40 mg Intravenous Given 06/20/23 0102)  lactated ringers bolus 1,000 mL (1,000 mLs Intravenous New Bag/Given 06/20/23 0215)    ED Course/ Medical Decision Making/ A&P                             Medical Decision Making Amount and/or Complexity of Data Reviewed Labs: ordered. Radiology: ordered. ECG/medicine tests: ordered.  Risk OTC drugs. Prescription drug management.  Will eval for e/o other causes or severe dehydration, lytes related to decreased PO.  Labs reassuring. Mild dehydration on exam with increased HR and sunken eyes so fluids given. Message to GI to see if they think EGD is appropraite. Will add on carafate.   Final Clinical Impression(s) / ED Diagnoses Final diagnoses:  Esophageal dysphagia    Rx / DC Orders ED Discharge Orders          Ordered    sucralfate (CARAFATE) 1 GM/10ML suspension  3 times daily with meals & bedtime        06/20/23 0247              Carolyne Whitsel, Barbara Cower, MD 06/20/23 [phone]

## 2023-06-24 NOTE — Progress Notes (Unsigned)
Referring Provider: Benita Stabile, MD Primary Care Physician:  Benita Stabile, MD Primary GI Physician: Dr. Marletta Lor  No chief complaint on file.   HPI:   Christine Leblanc is a 89 y.o. female with history of GERD, dysphagia, weight loss, constipation.  EGD March 2016: Proximal esophageal web s/p dilation, multiple gastric polyps s/p multiple biopsies, mild erosive gastritis s/p biopsy.  Pathology with fundic gland polyps, chronic gastritis, negative for H. pylori.   Modified barium swallow 09/24/2022:  Severe oropharyngeal dysphagia characterized by weak lingual movement resulting in prolonged oral transit, piecemeal deglutition, reduced anterior posterior movement with lingual residuals; pharyngeal phase is marked by impaired movement and timing of velopharyngeal closure, reduced tongue base retraction and little no epiglottic deflection with reduced laryngeal vestibule closure resulting in significant vallecula residue and aspiration after the swallow with all liquid consistencies. Head turn to the LEFT was possibly slightly better in terms of moving more of the bolus through the pharynx and UES than head neutral or to the right. Pt with wet vocal quality and intermittent throat clears and some coughing throughout, but never able to remove aspirate from trachea. The barium tablet became lodged in the valleculae and was not cleared and Pt without awareness of the same (reduced sensation). This imaging was compare to her last MBSS in 2017 and found to be similar in terms of physiology, but worse (greater weakness and increased residuals and aspiration).   Pt may wish to consider alternative means of nutrition to supplement oral nutrition, however she is unsure she wants to do this. Recommend that she meet with a dietician to help guide her in amount of calories needed per day. She would like to continue with oral diet and she was encouraged to continue practicing good oral care and improve her cough to  continue with D2/soft finely chopped and thin liquids. There did not appear to be an appreciable difference in thin versus thickened liquids (increased residuals with thicker consistencies and textures). Because the barium tablet was not removed from her valleculae, she should try to crush medications as able and take in puree/yogurt and follow with liquid wash.   Last seen in the office 05/01/2023.  She had gained 5 pounds in the last 7 months.  Her primary concern was the amount of mucus that she has in her throat.  Stated she was swallowing frequently.  Few times a day, mucus would get stuck in her throat and it would take 30 minutes to an hour to get it coughed back up.  Also with trouble with this during the night.  Reported postnasal drainage but also some regurgitation of mucus as well.  Did not burn like heartburn.  Only few episodes of indigestion.  No nausea/vomiting.  Intermittent upper abdominal pain the last few weeks with no identified triggers.  She is on omeprazole daily.  She started guaifenesin which had helped some.  Overall, swallowing at baseline.  Doing okay with soft foods and pure.  Also drinking 1 or 2 boost daily.  Overall, felt throat clearing was multifactorial in setting of PND, oropharyngeal dysphagia, atypical GERD/NERD.  Upper abdominal pain differential included GERD, gastritis, duodenitis, PUD, malignancy.  Plan to increase omeprazole to 40 mg twice daily.  Could consider EGD if persistent upper abdominal pain, but trying to avoid this in this frail 89 year old lady.  She was seen in the emergency room 6/19 reporting worsening episodes of choking and coughing to the point that she cannot take her  medications.  No significant abnormalities on labs.  No acute abnormalities on chest x-ray.  Carafate was added to her regular medication regimen and she was advised to follow-up with GI.  Today:    Past Medical History:  Diagnosis Date   Anemia    Asthma    Baker's cyst     Cancer (HCC)    skin Ca- ? basal cell    Chronic back pain    Chronic neck pain    Degenerative joint disease    Left shoulder; cervical spine, knees & hands    Gastroesophageal reflux disease    Hiatal hernia; distal esophageal web requiring dilatation; gastric polyps; gastritis; refuses colonoscopy   GERD (gastroesophageal reflux disease)    History of stress test 1990's   stress test done under the care of Dr. Dietrich Pates & Dr. Allyson Sabal, now being followed by Dr. Wyline Mood- in [place] , recently seen & told to f/U in one yr.    Hyperlipidemia 04/27/2011   Hypertension    Hypothyroidism    Lymphocytic thyroiditis    Mild carotid artery disease (HCC)    Multiple thyroid nodules 2010   Adenomatous; thyroidectomy in 2010   Orthostasis    Osteoarthritis of right knee 07/27/2014   Pneumonia    hosp. for pneumonia- long time ago    Primary localized osteoarthrosis of right shoulder 06/21/2015   Seizures (HCC)    yes- as a child- & into adult years, states she took med. for them at one time, stopped at 32 yrs. of age    Syncope and collapse 2007   Possible CVA in 2012 with left lower extremity weakness; refused hospitalization; CT-Atrophy and chronic microvascular ischemic change.     Past Surgical History:  Procedure Laterality Date   ABDOMINAL HYSTERECTOMY     fibroids   CATARACT EXTRACTION     Bilateral; redo surgery on the right for incomplete primary procedure   COLONOSCOPY  Remote   ESOPHAGOGASTRODUODENOSCOPY  11/2009   Dr. Darrick Penna: probable distal web s/p dilation small hiatal hernia/gastric polyps/mild gastritis, appeared to have narrowing at the junction of D1 and D2 dilated up to 12 mm   ESOPHAGOGASTRODUODENOSCOPY N/A 03/21/2015   Surgeon: West Bali, MD; proximal esophageal web s/p dilation, multiple gastric polyps s/p multiple biopsies, mild erosive gastritis s/p biopsy.  Pathology with fundic gland polyps, chronic gastritis, negative for H. pylori.   EYE SURGERY      PROLAPSED UTERINE FIBROID LIGATION     outcomed with rectocele & cystocele   SAVORY DILATION N/A 03/21/2015   Procedure: SAVORY DILATION;  Surgeon: West Bali, MD;  Location: [facility] SUITE;  Service: Endoscopy;  Laterality: N/A;   SHOULDER SURGERY     left   TOTAL KNEE ARTHROPLASTY Right 07/27/2014   Procedure: RIGHT TOTAL KNEE ARTHROPLASTY;  Surgeon: Eulas Post, MD;  Location: [facility] OR;  Service: Orthopedics;  Laterality: Right;   TOTAL SHOULDER ARTHROPLASTY Right 06/21/2015   Procedure: RIGHT TOTAL SHOULDER ARTHROPLASTY;  Surgeon: Teryl Lucy, MD;  Location: [facility] OR;  Service: Orthopedics;  Laterality: Right;   TOTAL THYROIDECTOMY  2010    Current Outpatient Medications  Medication Sig Dispense Refill   albuterol (VENTOLIN HFA) 108 (90 Base) MCG/ACT inhaler Inhale 1-2 puffs into the lungs every 6 (six) hours as needed for wheezing or shortness of breath. Wheezing, Asthma Symptoms 8 g 0   aspirin EC 81 MG tablet Take 81 mg by mouth daily. Swallow whole.     azithromycin (ZITHROMAX) 250 MG  tablet TAKE 2 TABLETS (500 MG) BY ORAL ROUTE ONCE DAILY FOR 1 DAY THEN 1 TABLET (250 MG) BY ORAL ROUTE ONCE DAILY FOR 4 DAYS     Carbinoxamine Maleate 4 MG TABS Take 1 tablet (4 mg total) by mouth at bedtime. 90 tablet 4   chlorhexidine (PERIDEX) 0.12 % solution 15 mLs 2 (two) times daily.     diazepam (VALIUM) 5 MG tablet Take 5 mg by mouth daily as needed.     ferrous sulfate 325 (65 FE) MG tablet Take 325 mg by mouth. Takes one on Monday, Wednesday and Friday     guaiFENesin (MUCINEX) 600 MG 12 hr tablet Take 600 mg by mouth 2 (two) times daily.     HYDROcodone-acetaminophen (NORCO/VICODIN) 5-325 MG tablet Take one tablet by mouth evyer 8 hours as needed for pain     ipratropium (ATROVENT) 0.06 % nasal spray USE 2 SPRAYS IN EACH NOSTRIL THREE TIMES A DAY 45 mL 1   levothyroxine (SYNTHROID) 88 MCG tablet TAKE 1 TABLET DAILY ON MONDAY THROUGH SATURDAY AND ONE-HALF (1/2) TABLET ON SUNDAY 90 tablet 3    omeprazole (PRILOSEC) 40 MG capsule Take 1 capsule (40 mg total) by mouth 2 (two) times daily before a meal. 60 capsule 3   OVER THE COUNTER MEDICATION Chest congestion relief and mucus relief dm daily     pravastatin (PRAVACHOL) 40 MG tablet TAKE 2 TABLETS DAILY 180 tablet 3   senna (SENOKOT) 8.6 MG tablet Take 2 tablets by mouth at bedtime.     sucralfate (CARAFATE) 1 GM/10ML suspension Take 10 mLs (1 g total) by mouth 4 (four) times daily -  with meals and at bedtime. 420 mL 0   No current facility-administered medications for this visit.    Allergies as of 06/26/2023 - Review Complete 06/19/2023  Allergen Reaction Noted   Celecoxib Shortness Of Breath    Dexlansoprazole Anaphylaxis and Other (See Comments) 05/04/2016   Famotidine Other (See Comments) 11/04/2018    Family History  Problem Relation Age of Onset   Heart disease Mother    Gallbladder disease Mother    Heart failure Mother    Diabetes Mother    Breast cancer Sister        30  years ago   Diabetes Sister    Heart attack Father    Diabetes Maternal Grandmother    Colon cancer Neg Hx    Stroke Neg Hx     Social History   Socioeconomic History   Marital status: Widowed    Spouse name: Not on file   Number of children: 4   Years of education: 58   Highest education level: Not on file  Occupational History   Occupation: Artist    Comment: does not yield regular income   Occupation: Retired    Comment: Engineer, civil (consulting)  Tobacco Use   Smoking status: Former    Packs/day: 0.80    Years: 20.00    Additional pack years: 0.00    Total pack years: 16.00    Types: Cigarettes    Quit date: 12/31/1978    Years since quitting: 44.5   Smokeless tobacco: Never  Vaping Use   Vaping Use: Never used  Substance and Sexual Activity   Alcohol use: Yes    Comment: rarely   Drug use: No   Sexual activity: Never    Birth control/protection: Surgical    Comment: widowed since 2010  Other Topics Concern   Not on file  Social  History Narrative   **  Merged History Encounter **       Lives at home. Her daughter lives with her.  Caffeine use: 1 cup coffee per day    Social Determinants of Health   Financial Resource Strain: Not on file  Food Insecurity: Not on file  Transportation Needs: Not on file  Physical Activity: Not on file  Stress: Not on file  Social Connections: Not on file    Review of Systems: Gen: Denies fever, chills, anorexia. Denies fatigue, weakness, weight loss.  CV: Denies chest pain, palpitations, syncope, peripheral edema, and claudication. Resp: Denies dyspnea at rest, cough, wheezing, coughing up blood, and pleurisy. GI: Denies vomiting blood, jaundice, and fecal incontinence.   Denies dysphagia or odynophagia. Derm: Denies rash, itching, dry skin Psych: Denies depression, anxiety, memory loss, confusion. No homicidal or suicidal ideation.  Heme: Denies bruising, bleeding, and enlarged lymph nodes.  Physical Exam: There were no vitals taken for this visit. General:   Alert and oriented. No distress noted. Pleasant and cooperative.  Head:  Normocephalic and atraumatic. Eyes:  Conjuctiva clear without scleral icterus. Heart:  S1, S2 present without murmurs appreciated. Lungs:  Clear to auscultation bilaterally. No wheezes, rales, or rhonchi. No distress.  Abdomen:  +BS, soft, non-tender and non-distended. No rebound or guarding. No HSM or masses noted. Msk:  Symmetrical without gross deformities. Normal posture. Extremities:  Without edema. Neurologic:  Alert and  oriented x4 Psych:  Normal mood and affect.    Assessment:     Plan:  ***   Ermalinda Memos, PA-C [facility] 06/26/2023

## 2023-06-25 ENCOUNTER — Encounter: Payer: Self-pay | Admitting: Physician Assistant

## 2023-06-25 ENCOUNTER — Emergency Department (HOSPITAL_COMMUNITY)
Admission: EM | Admit: 2023-06-25 | Discharge: 2023-06-25 | Disposition: A | Discharge status: Home / Self Care | Payer: Medicare Other | Attending: Emergency Medicine | Admitting: Emergency Medicine

## 2023-06-25 ENCOUNTER — Encounter (HOSPITAL_COMMUNITY): Payer: Self-pay

## 2023-06-25 ENCOUNTER — Other Ambulatory Visit: Payer: Self-pay

## 2023-06-25 ENCOUNTER — Emergency Department (HOSPITAL_COMMUNITY): Payer: Medicare Other

## 2023-06-25 DIAGNOSIS — Z7982 Long term (current) use of aspirin: Secondary | ICD-10-CM | POA: Insufficient documentation

## 2023-06-25 DIAGNOSIS — R058 Other specified cough: Secondary | ICD-10-CM | POA: Insufficient documentation

## 2023-06-25 DIAGNOSIS — J449 Chronic obstructive pulmonary disease, unspecified: Secondary | ICD-10-CM | POA: Diagnosis not present

## 2023-06-25 DIAGNOSIS — R059 Cough, unspecified: Secondary | ICD-10-CM | POA: Diagnosis not present

## 2023-06-25 MED ORDER — LIDOCAINE VISCOUS HCL 2 % MT SOLN: 15.0000 mL | OROMUCOSAL | 0 refills | Status: AC | PRN

## 2023-06-25 MED ORDER — PANTOPRAZOLE SODIUM 40 MG PO TBEC: 40.0000 mg | DELAYED_RELEASE_TABLET | Freq: Two times a day (BID) | ORAL | 0 refills | Status: DC

## 2023-06-25 MED ORDER — PANTOPRAZOLE SODIUM 40 MG IV SOLR: 40.0000 mg | Freq: Once | INTRAVENOUS | Status: DC

## 2023-06-25 NOTE — ED Provider Notes (Signed)
[place] EMERGENCY DEPARTMENT AT [facility] Provider Note   CSN: [id number] Arrival date & time: 06/25/23  4098     History  No chief complaint on file.  Son is at bedside and helps provide some of the history.  Christine Leblanc is a 89 y.o. female with GERD on omeprazole, dysphagia currently on pured diet, COPD, presents with concern of chronically coughing up mucus.  She states she has to rinse her mouth with water and get her mucus out about 4 times a day.  This is making her throat feel dry and has been going on for several months.  She has been taking guaifenesin at home from several weeks which does help with her coughing.  She visited the ER on 6/20 for the same reasons and was given lidocaine, which she reports improvement with.  She is asking for lidocaine today.  Denies fever, chills, shortness of breath, chest pain.  HPI     Home Medications Prior to Admission medications   Medication Sig Start Date End Date Taking? Authorizing Provider  lidocaine (XYLOCAINE) 2 % solution Use as directed 15 mLs in the mouth or throat as needed for up to 7 days for mouth pain. 06/25/23 07/02/23 Yes Arabella Merles, PA-C  pantoprazole (PROTONIX) 40 MG tablet Take 1 tablet (40 mg total) by mouth 2 (two) times daily. 06/25/23 07/25/23 Yes Arabella Merles, PA-C  albuterol (VENTOLIN HFA) 108 (90 Base) MCG/ACT inhaler Inhale 1-2 puffs into the lungs every 6 (six) hours as needed for wheezing or shortness of breath. Wheezing, Asthma Symptoms 06/23/22   Dimple Nanas, MD  aspirin EC 81 MG tablet Take 81 mg by mouth daily. Swallow whole.    [provider]  azithromycin (ZITHROMAX) 250 MG tablet TAKE 2 TABLETS (500 MG) BY ORAL ROUTE ONCE DAILY FOR 1 DAY THEN 1 TABLET (250 MG) BY ORAL ROUTE ONCE DAILY FOR 4 DAYS 05/29/23   [provider]  Carbinoxamine Maleate 4 MG TABS Take 1 tablet (4 mg total) by mouth at bedtime. 01/02/23 04/02/23  Alfonse Spruce, MD  chlorhexidine  (PERIDEX) 0.12 % solution 15 mLs 2 (two) times daily. 05/10/23   [provider]  diazepam (VALIUM) 5 MG tablet Take 5 mg by mouth daily as needed. 05/29/23   [provider]  ferrous sulfate 325 (65 FE) MG tablet Take 325 mg by mouth. Takes one on Monday, Wednesday and Friday    [provider]  guaiFENesin (MUCINEX) 600 MG 12 hr tablet Take 600 mg by mouth 2 (two) times daily.    [provider]  HYDROcodone-acetaminophen (NORCO/VICODIN) 5-325 MG tablet Take one tablet by mouth evyer 8 hours as needed for pain    [provider]  ipratropium (ATROVENT) 0.06 % nasal spray USE 2 SPRAYS IN [facility] NOSTRIL THREE TIMES A DAY 07/25/22   Alfonse Spruce, MD  levothyroxine (SYNTHROID) 88 MCG tablet TAKE 1 TABLET DAILY ON MONDAY THROUGH SATURDAY AND ONE-HALF (1/2) TABLET ON SUNDAY 10/10/22   Reather Littler, MD  OVER THE COUNTER MEDICATION Chest congestion relief and mucus relief dm daily    [provider]  pravastatin (PRAVACHOL) 40 MG tablet TAKE 2 TABLETS DAILY 07/26/22   Iran Ouch, [place] M, PA-C  senna (SENOKOT) 8.6 MG tablet Take 2 tablets by mouth at bedtime.    [provider]  sucralfate (CARAFATE) 1 GM/10ML suspension Take 10 mLs (1 g total) by mouth 4 (four) times daily -  with meals and at bedtime.  06/20/23   Mesner, Barbara Cower, MD      Allergies    Celecoxib, Dexlansoprazole, and Famotidine    Review of Systems   Review of Systems  Constitutional:  Negative for chills and fever.  Respiratory:  Positive for cough. Negative for shortness of breath.     Physical Exam Updated Vital Signs BP (!) 161/97   Pulse 94   Temp 97.9 F (36.6 C) (Oral)   Resp 18   Ht 5\' 1"  (1.549 m)   Wt 44.5 kg   SpO2 100%   BMI 18.52 kg/m  Physical Exam Vitals and nursing note reviewed.  Constitutional:      Appearance: Normal appearance.  HENT:     Head: Atraumatic.     Mouth/Throat:     Mouth: Mucous membranes are dry.  Cardiovascular:      Rate and Rhythm: Normal rate and regular rhythm.  Pulmonary:     Effort: Pulmonary effort is normal. No respiratory distress.     Breath sounds: Normal breath sounds.  Abdominal:     General: Abdomen is flat. Bowel sounds are normal.     Palpations: Abdomen is soft.  Neurological:     General: No focal deficit present.     Mental Status: She is alert.  Psychiatric:        Mood and Affect: Mood normal.        Behavior: Behavior normal.     ED Results / Procedures / Treatments   Labs (all labs ordered are listed, but only abnormal results are displayed) Labs Reviewed - No data to display  EKG None  Radiology DG Chest Portable 1 View  Result Date: 06/25/2023 CLINICAL DATA:  Mucous EXAM: PORTABLE CHEST 1 VIEW COMPARISON:  X-ray 06/19/2023 FINDINGS: Hyperinflation. Chronic interstitial changes. No consolidation, pneumothorax, effusion or edema. Normal cardiopericardial silhouette. Calcified aorta. Curvature and degenerative changes of the spine. Bilateral shoulder arthroplasties are seen. Osteopenia. IMPRESSION: Hyperinflation with chronic changes. No acute cardiopulmonary disease Electronically Signed   By: Karen Kays M.D.   On: 06/25/2023 11:01    Procedures Procedures    Medications Ordered in ED Medications - No data to display  ED Course/ Medical Decision Making/ A&P                             Medical Decision Making Amount and/or Complexity of Data Reviewed Radiology: ordered.   89 y.o. female with pertinent past medical history of COPD, GERD, dysphagia presents to the ED for concern of coughing up mucus for the past several months.  Differential diagnosis includes but is not limited to bronchiectasis, chronic bronchitis, pneumonia, URI  ED Course:  Patient comfortable appearing in bed, with mild coughing.  She is talking in full sentences.  Vital signs are normal.  Will obtain chest x-ray to ensure no concerning findings that may have developed since her last  visit.  Do not think pneumonia is likely given she has no fever, chills.  Do not suspect URI as this has been a chronic issue.    Impression: Chronic productive cough  Disposition:  The patient was discharged home with instructions to attend her GI appointment tomorrow.  Instructions also given to contact pulmonologist for an appointment discuss her cough and mucus.  She has been switched from omeprazole to Protonix and has been given viscous lidocaine for use until she can get to these appointments as she reports this helps her symptoms.  Encouraged her to  increase fluid intake.  Return precautions given    Lab Tests: I Ordered, and personally interpreted labs.  The pertinent results include:   Imaging Studies ordered: I ordered imaging studies including CXR  I independently visualized the imaging with scope of interpretation limited to determining acute life threatening conditions related to emergency care. Imaging showed no consolidations or pleural effusions I agree with the radiologist interpretation   Cardiac Monitoring: / EKG: The patient was maintained on a cardiac monitor.  I personally viewed and interpreted the cardiac monitored which showed an underlying rhythm of: NSR   Consultations Obtained: None   Co morbidities that complicate the patient evaluation  COPD, GERD, dysphagia  Social Determinants of Health:  Unknown              Final Clinical Impression(s) / ED Diagnoses Final diagnoses:  Productive cough    Rx / DC Orders ED Discharge Orders          Ordered    pantoprazole (PROTONIX) 40 MG tablet  2 times daily        06/25/23 1222    lidocaine (XYLOCAINE) 2 % solution  As needed        06/25/23 1222              Arabella Merles, PA-C 06/25/23 1231    Eber Hong, MD 07/05/23 [phone]

## 2023-06-25 NOTE — ED Notes (Signed)
Pt able to speak clearly. Does sound congested somewhat. States she's been coughing mucous all night.

## 2023-06-25 NOTE — Discharge Instructions (Addendum)
Please go to your GI appointment tomorrow. Contact information given for a pulmonology.  Please contact them to schedule an appointment to discuss your mucus.  You may continue to take your guaifenesin (Mucinex) at home for your cough.  Prescription given for viscous lidocaine and Protonix.  Take these medications as directed.  Stop taking your omeprazole.  Please to return to the ER should you develop shortness of breath, chest pain, fever, chills.

## 2023-06-25 NOTE — ED Triage Notes (Signed)
Pt son brought her in today stating pt has "mucous reflux" and pt been up all night "choking on mucous" Pt son said they were here "the other day" and pt was given lidocaine to help. Pt wants that again according to pt and son.

## 2023-06-26 ENCOUNTER — Encounter: Payer: Self-pay | Admitting: Gastroenterology

## 2023-06-26 ENCOUNTER — Ambulatory Visit (INDEPENDENT_AMBULATORY_CARE_PROVIDER_SITE_OTHER): Payer: Medicare Other | Admitting: Gastroenterology

## 2023-06-26 ENCOUNTER — Other Ambulatory Visit: Payer: Self-pay | Admitting: *Deleted

## 2023-06-26 VITALS — BP 134/74 | HR 98 | Temp 97.9°F | Ht 61.0 in | Wt 97.0 lb

## 2023-06-26 DIAGNOSIS — R1312 Dysphagia, oropharyngeal phase: Secondary | ICD-10-CM

## 2023-06-26 DIAGNOSIS — R1310 Dysphagia, unspecified: Secondary | ICD-10-CM

## 2023-06-26 DIAGNOSIS — K219 Gastro-esophageal reflux disease without esophagitis: Secondary | ICD-10-CM

## 2023-06-26 MED ORDER — PANTOPRAZOLE SODIUM 40 MG PO TBEC: 40.0000 mg | DELAYED_RELEASE_TABLET | Freq: Two times a day (BID) | ORAL | 5 refills | Status: DC

## 2023-06-26 NOTE — Patient Instructions (Signed)
As we discussed, I suspect the sensation of mucous in your throat is secondary to the weak muscles in your mouth and throat area and weak swallow leading to food or liquid residuals and or saliva sitting in the back of your throat and or potentially going into your windpipe causing you to have coughing.   Will refer you back to speech therapy for follow-up.  We can plan to follow-up in 6 months to see how you are doing. You can call sooner if needed.   It was good to see you again today!   Ermalinda Memos, PA-C [facility]

## 2023-06-27 ENCOUNTER — Telehealth: Payer: Self-pay

## 2023-06-27 ENCOUNTER — Other Ambulatory Visit (HOSPITAL_COMMUNITY): Payer: Self-pay | Admitting: Occupational Therapy

## 2023-06-27 DIAGNOSIS — K2100 Gastro-esophageal reflux disease with esophagitis, without bleeding: Secondary | ICD-10-CM

## 2023-06-27 DIAGNOSIS — R1312 Dysphagia, oropharyngeal phase: Secondary | ICD-10-CM

## 2023-06-27 NOTE — Telephone Encounter (Signed)
Transition Care Management Unsuccessful Follow-up Telephone Call  Date of discharge and from where:  [facility] 6[facility]  Attempts:  2nd Attempt  Reason for unsuccessful TCM follow-up call:  No answer/busy   Lenard Forth [facility] Guide, [facility] [phone] [address] [place], [place], [place] [address] Phone: [phone] Email: [email or web address]

## 2023-06-27 NOTE — Telephone Encounter (Signed)
Transition Care Management Unsuccessful Follow-up Telephone Call  Date of discharge and from where:  [facility] 6/20  Attempts:  1st Attempt  Reason for unsuccessful TCM follow-up call:  Unable to leave message   Aleta Manternach [facility] Guide, [place] [phone] [address], [place], [place] [address] Phone: [phone] Email: [email or web address]       

## 2023-07-02 ENCOUNTER — Telehealth: Payer: Self-pay

## 2023-07-02 NOTE — Telephone Encounter (Signed)
Transition Care Management Unsuccessful Follow-up Telephone Call  Date of discharge and from where:  06/25/2023 [facility]  Attempts:  1st Attempt  Reason for unsuccessful TCM follow-up call:  No answer/busy  Marena Witts [facility]  [facility] Population Health Community Resource Care Guide   [email or web address]  ?? [phone]   Website: [email or web address]  [email or web address]

## 2023-07-02 NOTE — Telephone Encounter (Signed)
Transition Care Management Unsuccessful Follow-up Telephone Call  Date of discharge and from where:  06/25/2023 [facility]  Attempts:  2nd Attempt  Reason for unsuccessful TCM follow-up call:  No answer/busy  Christine Leblanc [facility]  [facility] Population Health Community Resource Care Guide   [email or web address]  ?? [phone]   Website: [email or web address]

## 2023-07-10 ENCOUNTER — Other Ambulatory Visit (HOSPITAL_COMMUNITY): Payer: Medicare Other

## 2023-07-10 ENCOUNTER — Ambulatory Visit (HOSPITAL_COMMUNITY): Payer: Medicare Other | Admitting: Speech Pathology

## 2023-07-24 ENCOUNTER — Ambulatory Visit (HOSPITAL_COMMUNITY): Payer: Medicare Other | Admitting: Speech Pathology

## 2023-07-24 ENCOUNTER — Encounter (HOSPITAL_COMMUNITY): Payer: Self-pay | Admitting: Speech Pathology

## 2023-07-24 ENCOUNTER — Ambulatory Visit (HOSPITAL_COMMUNITY)
Admission: RE | Admit: 2023-07-24 | Discharge: 2023-07-24 | Disposition: A | Discharge status: Home / Self Care | Payer: Medicare Other | Source: Ambulatory Visit | Attending: Gastroenterology | Admitting: Gastroenterology

## 2023-07-24 DIAGNOSIS — R1310 Dysphagia, unspecified: Secondary | ICD-10-CM | POA: Insufficient documentation

## 2023-07-24 DIAGNOSIS — K219 Gastro-esophageal reflux disease without esophagitis: Secondary | ICD-10-CM | POA: Insufficient documentation

## 2023-07-24 DIAGNOSIS — R1312 Dysphagia, oropharyngeal phase: Secondary | ICD-10-CM | POA: Insufficient documentation

## 2023-07-24 DIAGNOSIS — R059 Cough, unspecified: Secondary | ICD-10-CM | POA: Diagnosis not present

## 2023-07-24 DIAGNOSIS — K2100 Gastro-esophageal reflux disease with esophagitis, without bleeding: Secondary | ICD-10-CM | POA: Insufficient documentation

## 2023-07-24 NOTE — Therapy (Signed)
[facility] Outpatient Rehabilitation at [facility] [address] [place], [place], [address] Phone: [phone]   Fax:  [phone]  Modified Barium Swallow  Patient Details  Name: Christine Leblanc MRN: [id number] Date of Birth: [birth year] No data recorded  Encounter Date: 07/24/2023   End of Session - 07/24/23 1509     Visit Number 1    Number of Visits 1    SLP Start Time 1400    SLP Stop Time  1437    SLP Time Calculation (min) 37 min    Activity Tolerance Patient tolerated treatment well            Modified Barium Swallow Study  Patient Details  Name: Christine Leblanc MRN: [id number] Date of Birth: 11/20/1929  Today's Date: 07/24/2023  HPI/PMH: HPI: [name] is a 89 year old female with history of GERD, severe oropharyngeal dysphagia, weight loss, constipation, who recently (06/26/23) presented to gastroenterology with chief complaint of coughing up mucus.  This has been an ongoing issue for her.  She feels that she frequently has to clear her throat and will spend hours trying to cough "mucus" up. Pt has a long-standing ST hx and known chronic severe oropharyngeal dysphagia; most recent MBSS completed 09/25/23 revealing aspiration of all liquid consistencies recommendation for considering alternative means of nutrition and D1/D2 diet with thin liquids. Pts weight is stable and she has not had recurrent respiratory illness but Pt and her son are requesting ongoing discussion/counseling on her current oropharyngeal dysfunction and question additional recommendations/exercises. MBSS requested to objectively re-assess Pt's swallowing function and provide support and additional recommendations    Clinical Impression: Pt presents with stable severe oropharyngeal dysphagia. Biomechanical parameters and dysphagic outcomes appear largely unchanged compared to last evaluation. Today Pt presents with decreased coordination of the swallow, little/no epiglottic deflection,  diminished pharyngeal stripping wave and decreased laryngeal vestibule closure which results in trace to moderate amounts of silent aspiration of all liquid consistencies. Moderate to majority of contrast remains in the pharyngeal space (significant valleculae residue) after the initial swallow, worsening with thicker/heavier consistencies. Head turn to the left with a slight chin tuck with swallows does seem to facilitate pharyngeal clearance in conjunction with liquid wash to clear residue.  Pt with wet vocal quality and intermittent throat clears and some coughing throughout, but Pt was never able to remove aspirates from trachea. In contrasting liquid consistencies, visualized more aspiration of thin liquids with the initial swallow but increased pharyngeal residue of NTL and HTL. Residue mixes with secretions and is evenutally aspirated; With noting these impairments there still does not appear to be an appreciable difference in thin versus thickened liquids. The barium tablet became lodged in the valleculae and was not cleared which was found to be similar in comparing report/imaging from recent MBSS (09/25/23). Pt consumes protien shakes and D2/puree diet at home and is reportedly maintaining her weight; Alternative means of nutrition has been recommended and declined many times. Pt appears to have been managing her severe dysphagia for several years without getting PNA.  Recommend continue with puree/D2 diet and thin liquids and crush medications as able and take in puree/yogurt and follow with liquid wash. Despite Pt's oropharyngeal dysphagia being chronic and unchanged, Recommend HH ST to provide therapy to reinforce strategies and an exercise program (per request of Pt and Pt's son). Reviewed study and recommendations at length with Pt and her son.   Factors that may increase risk of adverse event in presence of aspiration Christine Leblanc &  Clearance Coots 2021): No data recorded  Recommendations/Plan: Swallowing  Evaluation Recommendations Swallowing Evaluation Recommendations Recommendations: PO diet PO Diet Recommendation: Dysphagia 1 (Pureed); Thin liquids (Level 0); Dysphagia 2 (Finely chopped) Liquid Administration via: Cup; Straw Medication Administration: Whole meds with puree Supervision: Patient able to self-feed Swallowing strategies  : Head turn left during swallowing; Chin tuck; Follow solids with liquids Postural changes: Stay upright 30-60 min after meals Oral care recommendations: Oral care QID (4x/day)    Treatment Plan Treatment Plan Treatment recommendations: Therapy as outlined in treatment plan below Follow-up recommendations: Home health SLP     Recommendations Recommendations for follow up therapy are one component of a multi-disciplinary discharge planning process, led by the attending physician.  Recommendations may be updated based on patient status, additional functional criteria and insurance authorization.  Assessment: Orofacial Exam: Orofacial Exam Oral Cavity: Oral Hygiene: WFL Oral Cavity - Dentition: Edentulous Orofacial Anatomy: WFL Oral Motor/Sensory Function: WFL    Anatomy:  Anatomy: WFL   Boluses Administered: Boluses Administered Boluses Administered: Thin liquids (Level 0); Mildly thick liquids (Level 2, nectar thick); Moderately thick liquids (Level 3, honey thick); Puree     Oral Impairment Domain: Oral Impairment Domain Lip Closure: Interlabial escape, no progression to anterior lip (negatively impacted by edentulous status) Tongue control during bolus hold: Escape to lateral buccal cavity/floor of mouth Bolus preparation/mastication: Slow prolonged chewing/mashing with complete recollection Bolus transport/lingual motion: Brisk tongue motion Oral residue: Trace residue lining oral structures; Residue collection on oral structures Location of oral residue : Tongue Initiation of pharyngeal swallow : Pyriform sinuses      Pharyngeal Impairment Domain: Pharyngeal Impairment Domain Soft palate elevation: No bolus between soft palate (SP)/pharyngeal wall (PW) Laryngeal elevation: Partial superior movement of thyroid cartilage/partial approximation of arytenoids to epiglottic petiole Anterior hyoid excursion: Partial anterior movement Epiglottic movement: No inversion Laryngeal vestibule closure: Incomplete, narrow column air/contrast in laryngeal vestibule Pharyngeal stripping wave : Present - diminished Pharyngeal contraction (A/P view only): N/A Pharyngoesophageal segment opening: Minimal distention/minimal duration, marked obstruction of flow Tongue base retraction: Narrow column of contrast or air between tongue base and PPW; Trace column of contrast or air between tongue base and PPW Pharyngeal residue: Collection of residue within or on pharyngeal structures; Majority of contrast within or on pharyngeal structures Location of pharyngeal residue: Pharyngeal wall; Aryepiglottic folds; Pyriform sinuses; Diffuse (>3 areas); Valleculae; Tongue base     Esophageal Impairment Domain: Esophageal Impairment Domain Esophageal clearance upright position: Esophageal retention with retrograde flow below pharyngoesophageal segment (PES) (very small zenker's diverticulum)    Pill: Pill Consistency administered: Puree    Penetration/Aspiration Scale Score: Penetration/Aspiration Scale Score 8.  Material enters airway, passes BELOW cords without attempt by patient to eject out (silent aspiration) : Thin liquids (Level 0); Mildly thick liquids (Level 2, nectar thick)    Compensatory Strategies: Compensatory Strategies Compensatory strategies: Yes Chin tuck: Effective Left head turn: Effective Chin tuck combined with head turn: Effective       General Information: Caregiver present: Yes (Son,)   Diet Prior to this Study: Dysphagia 1 (pureed); Dysphagia 2 (finely chopped); Thin liquids (Level 0)     No data recorded   Respiratory Status: WFL    Supplemental O2: None (Room air)    History of Recent Intubation: No   Behavior/Cognition: Alert; Cooperative; Pleasant mood  Self-Feeding Abilities: Able to self-feed  Baseline vocal quality/speech: Normal  Volitional Cough: Able to elicit  Volitional Swallow: Able to elicit  No data recorded  Goal Planning: Prognosis  for improved oropharyngeal function: Guarded  No data recorded No data recorded No data recorded Consulted and agree with results and recommendations: Patient; Family member/caregiver   Pain: Pain Assessment Pain Assessment: No/denies pain    End of Session: Start Time:No data recorded Stop Time: No data recorded Time Calculation:No data recorded Charges: No data recorded SLP visit diagnosis: SLP Visit Diagnosis: Dysphagia, oropharyngeal phase (R13.12)    Past Medical History:  Past Medical History:  Diagnosis Date   Anemia    Asthma    Baker's cyst    Cancer (HCC)    skin Ca- ? basal cell    Chronic back pain    Chronic neck pain    Degenerative joint disease    Left shoulder; cervical spine, knees & hands    Gastroesophageal reflux disease    Hiatal hernia; distal esophageal web requiring dilatation; gastric polyps; gastritis; refuses colonoscopy   GERD (gastroesophageal reflux disease)    History of stress test 1990's   stress test done under the care of Dr. Dietrich Pates & Dr. Allyson Sabal, now being followed by Dr. Wyline Mood- in [place] , recently seen & told to f/U in one yr.    Hyperlipidemia 04/27/2011   Hypertension    Hypothyroidism    Lymphocytic thyroiditis    Mild carotid artery disease (HCC)    Multiple thyroid nodules 2010   Adenomatous; thyroidectomy in 2010   Orthostasis    Osteoarthritis of right knee 07/27/2014   Pneumonia    hosp. for pneumonia- long time ago    Primary localized osteoarthrosis of right shoulder 06/21/2015   Seizures (HCC)    yes- as a child- & into adult  years, states she took med. for them at one time, stopped at 32 yrs. of age    Syncope and collapse 2007   Possible CVA in 2012 with left lower extremity weakness; refused hospitalization; CT-Atrophy and chronic microvascular ischemic change.    Past Surgical History:  Past Surgical History:  Procedure Laterality Date   ABDOMINAL HYSTERECTOMY     fibroids   CATARACT EXTRACTION     Bilateral; redo surgery on the right for incomplete primary procedure   COLONOSCOPY  Remote   ESOPHAGOGASTRODUODENOSCOPY  11/2009   Dr. Darrick Penna: probable distal web s/p dilation small hiatal hernia/gastric polyps/mild gastritis, appeared to have narrowing at the junction of D1 and D2 dilated up to 12 mm   ESOPHAGOGASTRODUODENOSCOPY N/A 03/21/2015   Surgeon: West Bali, MD; proximal esophageal web s/p dilation, multiple gastric polyps s/p multiple biopsies, mild erosive gastritis s/p biopsy.  Pathology with fundic gland polyps, chronic gastritis, negative for H. pylori.   EYE SURGERY     PROLAPSED UTERINE FIBROID LIGATION     outcomed with rectocele & cystocele   SAVORY DILATION N/A 03/21/2015   Procedure: SAVORY DILATION;  Surgeon: West Bali, MD;  Location: [facility] SUITE;  Service: Endoscopy;  Laterality: N/A;   SHOULDER SURGERY     left   TOTAL KNEE ARTHROPLASTY Right 07/27/2014   Procedure: RIGHT TOTAL KNEE ARTHROPLASTY;  Surgeon: Eulas Post, MD;  Location: [facility] OR;  Service: Orthopedics;  Laterality: Right;   TOTAL SHOULDER ARTHROPLASTY Right 06/21/2015   Procedure: RIGHT TOTAL SHOULDER ARTHROPLASTY;  Surgeon: Teryl Lucy, MD;  Location: [facility] OR;  Service: Orthopedics;  Laterality: Right;   TOTAL THYROIDECTOMY  2010   Fredi Hurtado H. Romie Levee, CCC-SLP Speech Language Pathologist  Georgetta Haber 07/24/2023, 3:13 PM  Georgetta Haber, CCC-SLP 07/24/2023, 3:12 PM  [place]  [facility] Outpatient Rehabilitation at [facility] [address] [place], [place], [address] Phone: [phone]    Fax:  [phone]  Name: Christine Leblanc MRN: [id number] Date of Birth: [birth year]

## 2023-08-01 ENCOUNTER — Encounter: Payer: Self-pay | Admitting: Pulmonary Disease

## 2023-08-05 DIAGNOSIS — M1712 Unilateral primary osteoarthritis, left knee: Secondary | ICD-10-CM | POA: Diagnosis not present

## 2023-08-12 DIAGNOSIS — M1712 Unilateral primary osteoarthritis, left knee: Secondary | ICD-10-CM | POA: Diagnosis not present

## 2023-08-14 ENCOUNTER — Observation Stay (HOSPITAL_COMMUNITY)
Admission: EM | Admit: 2023-08-14 | Discharge: 2023-08-15 | Disposition: A | Discharge status: Home Health | Payer: Medicare Other | Attending: Family Medicine | Admitting: Family Medicine

## 2023-08-14 ENCOUNTER — Encounter (HOSPITAL_COMMUNITY): Payer: Self-pay

## 2023-08-14 ENCOUNTER — Other Ambulatory Visit: Payer: Self-pay

## 2023-08-14 DIAGNOSIS — Z7982 Long term (current) use of aspirin: Secondary | ICD-10-CM | POA: Insufficient documentation

## 2023-08-14 DIAGNOSIS — Z96611 Presence of right artificial shoulder joint: Secondary | ICD-10-CM | POA: Insufficient documentation

## 2023-08-14 DIAGNOSIS — I10 Essential (primary) hypertension: Secondary | ICD-10-CM | POA: Diagnosis not present

## 2023-08-14 DIAGNOSIS — Z96651 Presence of right artificial knee joint: Secondary | ICD-10-CM | POA: Insufficient documentation

## 2023-08-14 DIAGNOSIS — D649 Anemia, unspecified: Secondary | ICD-10-CM | POA: Insufficient documentation

## 2023-08-14 DIAGNOSIS — Z87891 Personal history of nicotine dependence: Secondary | ICD-10-CM | POA: Diagnosis not present

## 2023-08-14 DIAGNOSIS — E785 Hyperlipidemia, unspecified: Secondary | ICD-10-CM | POA: Diagnosis present

## 2023-08-14 DIAGNOSIS — J45909 Unspecified asthma, uncomplicated: Secondary | ICD-10-CM | POA: Insufficient documentation

## 2023-08-14 DIAGNOSIS — E039 Hypothyroidism, unspecified: Secondary | ICD-10-CM | POA: Diagnosis not present

## 2023-08-14 DIAGNOSIS — I2510 Atherosclerotic heart disease of native coronary artery without angina pectoris: Secondary | ICD-10-CM | POA: Diagnosis not present

## 2023-08-14 DIAGNOSIS — E441 Mild protein-calorie malnutrition: Secondary | ICD-10-CM | POA: Insufficient documentation

## 2023-08-14 DIAGNOSIS — I700 Atherosclerosis of aorta: Secondary | ICD-10-CM | POA: Diagnosis not present

## 2023-08-14 DIAGNOSIS — Z85828 Personal history of other malignant neoplasm of skin: Secondary | ICD-10-CM | POA: Insufficient documentation

## 2023-08-14 DIAGNOSIS — R1310 Dysphagia, unspecified: Principal | ICD-10-CM | POA: Insufficient documentation

## 2023-08-14 DIAGNOSIS — E890 Postprocedural hypothyroidism: Secondary | ICD-10-CM | POA: Diagnosis present

## 2023-08-14 DIAGNOSIS — Z79899 Other long term (current) drug therapy: Secondary | ICD-10-CM | POA: Insufficient documentation

## 2023-08-14 DIAGNOSIS — K219 Gastro-esophageal reflux disease without esophagitis: Secondary | ICD-10-CM | POA: Diagnosis present

## 2023-08-14 NOTE — ED Triage Notes (Signed)
Pt presents with trouble swallowing. Pt with hx of this but states that she has been up all night not able to eat or drink.

## 2023-08-14 NOTE — ED Notes (Addendum)
Pt alert and oriented. She reports that she frequently has bouts of difficulty swallowing. Today has been worse than usual and it took several hours for her to be able to clear the mucus from her throat. She reports it has improved since being here. Lung sounds clear bilaterally. NAD. SPO2 96% on RA. Kellogg RN

## 2023-08-15 ENCOUNTER — Encounter (HOSPITAL_COMMUNITY): Payer: Self-pay | Admitting: Family Medicine

## 2023-08-15 ENCOUNTER — Emergency Department (HOSPITAL_COMMUNITY): Payer: Medicare Other

## 2023-08-15 DIAGNOSIS — E890 Postprocedural hypothyroidism: Secondary | ICD-10-CM

## 2023-08-15 DIAGNOSIS — E441 Mild protein-calorie malnutrition: Secondary | ICD-10-CM | POA: Diagnosis not present

## 2023-08-15 DIAGNOSIS — K219 Gastro-esophageal reflux disease without esophagitis: Secondary | ICD-10-CM

## 2023-08-15 DIAGNOSIS — I10 Essential (primary) hypertension: Secondary | ICD-10-CM | POA: Diagnosis not present

## 2023-08-15 DIAGNOSIS — R1310 Dysphagia, unspecified: Principal | ICD-10-CM

## 2023-08-15 DIAGNOSIS — I700 Atherosclerosis of aorta: Secondary | ICD-10-CM | POA: Diagnosis not present

## 2023-08-15 DIAGNOSIS — E785 Hyperlipidemia, unspecified: Secondary | ICD-10-CM

## 2023-08-15 LAB — CBC WITH DIFFERENTIAL/PLATELET
Abs Immature Granulocytes: 0.01 10*3/uL (ref 0.00–0.07)
Basophils Absolute: 0.1 10*3/uL (ref 0.0–0.1)
Basophils Relative: 1 %
Eosinophils Absolute: 0.8 10*3/uL — ABNORMAL HIGH (ref 0.0–0.5)
Eosinophils Relative: 10 %
HCT: 30.3 % — ABNORMAL LOW (ref 36.0–46.0)
Hemoglobin: 9.8 g/dL — ABNORMAL LOW (ref 12.0–15.0)
Immature Granulocytes: 0 %
Lymphocytes Relative: 22 %
Lymphs Abs: 1.7 10*3/uL (ref 0.7–4.0)
MCH: 30.5 pg (ref 26.0–34.0)
MCHC: 32.3 g/dL (ref 30.0–36.0)
MCV: 94.4 fL (ref 80.0–100.0)
Monocytes Absolute: 0.7 10*3/uL (ref 0.1–1.0)
Monocytes Relative: 9 %
Neutro Abs: 4.7 10*3/uL (ref 1.7–7.7)
Neutrophils Relative %: 58 %
Platelets: 167 10*3/uL (ref 150–400)
RBC: 3.21 MIL/uL — ABNORMAL LOW (ref 3.87–5.11)
RDW: 13.5 % (ref 11.5–15.5)
WBC: 8 10*3/uL (ref 4.0–10.5)
nRBC: 0 % (ref 0.0–0.2)

## 2023-08-15 LAB — COMPREHENSIVE METABOLIC PANEL
ALT: 11 U/L (ref 0–44)
AST: 16 U/L (ref 15–41)
Albumin: 3.3 g/dL — ABNORMAL LOW (ref 3.5–5.0)
Alkaline Phosphatase: 63 U/L (ref 38–126)
Anion gap: 9 (ref 5–15)
BUN: 20 mg/dL (ref 8–23)
CO2: 26 mmol/L (ref 22–32)
Calcium: 8.9 mg/dL (ref 8.9–10.3)
Chloride: 101 mmol/L (ref 98–111)
Creatinine, Ser: 0.92 mg/dL (ref 0.44–1.00)
GFR, Estimated: 58 mL/min — ABNORMAL LOW (ref >60)
Glucose, Bld: 105 mg/dL — ABNORMAL HIGH (ref 70–99)
Potassium: 3.5 mmol/L (ref 3.5–5.1)
Sodium: 136 mmol/L (ref 135–145)
Total Bilirubin: 0.5 mg/dL (ref 0.3–1.2)
Total Protein: 6.8 g/dL (ref 6.5–8.1)

## 2023-08-15 MED ORDER — ACETAMINOPHEN 325 MG PO TABS: 650.0000 mg | ORAL_TABLET | Freq: Four times a day (QID) | ORAL | Status: DC | PRN

## 2023-08-15 MED ORDER — ALUM & MAG HYDROXIDE-SIMETH 200-200-20 MG/5ML PO SUSP
30.0000 mL | Freq: Once | ORAL | Status: AC
  Administered 2023-08-15: 30 mL via ORAL
  Filled 2023-08-15: qty 30

## 2023-08-15 MED ORDER — MAGIC MOUTHWASH W/LIDOCAINE: 5.0000 mL | Freq: Two times a day (BID) | ORAL | 0 refills | Status: DC | PRN

## 2023-08-15 MED ORDER — ONDANSETRON HCL 4 MG PO TABS: 4.0000 mg | ORAL_TABLET | Freq: Four times a day (QID) | ORAL | Status: DC | PRN

## 2023-08-15 MED ORDER — MORPHINE SULFATE (PF) 2 MG/ML IV SOLN: 2.0000 mg | INTRAVENOUS | Status: DC | PRN

## 2023-08-15 MED ORDER — ONDANSETRON HCL 4 MG/2ML IJ SOLN: 4.0000 mg | Freq: Four times a day (QID) | INTRAMUSCULAR | Status: DC | PRN

## 2023-08-15 MED ORDER — ENSURE ENLIVE PO LIQD
237.0000 mL | Freq: Two times a day (BID) | ORAL | Status: DC
  Administered 2023-08-15: 237 mL via ORAL
  Filled 2023-08-15 (×6): qty 237

## 2023-08-15 MED ORDER — ACETAMINOPHEN 650 MG RE SUPP: 650.0000 mg | Freq: Four times a day (QID) | RECTAL | Status: DC | PRN

## 2023-08-15 MED ORDER — LIDOCAINE VISCOUS HCL 2 % MT SOLN
15.0000 mL | Freq: Once | OROMUCOSAL | Status: AC
  Administered 2023-08-15: 15 mL via OROMUCOSAL
  Filled 2023-08-15: qty 15

## 2023-08-15 MED ORDER — HEPARIN SODIUM (PORCINE) 5000 UNIT/ML IJ SOLN
5000.0000 [IU] | Freq: Three times a day (TID) | INTRAMUSCULAR | Status: DC
  Administered 2023-08-15: 5000 [IU] via SUBCUTANEOUS
  Filled 2023-08-15: qty 1

## 2023-08-15 MED ORDER — PANTOPRAZOLE SODIUM 40 MG IV SOLR
40.0000 mg | Freq: Two times a day (BID) | INTRAVENOUS | Status: DC
  Administered 2023-08-15: 40 mg via INTRAVENOUS
  Filled 2023-08-15: qty 10

## 2023-08-15 NOTE — Assessment & Plan Note (Signed)
-   Continue statin when patient is able to tolerate p.o. medications

## 2023-08-15 NOTE — ED Notes (Signed)
ED TO INPATIENT HANDOFF REPORT  ED Nurse Name and Phone #:   S Name/Age/Gender Christine Leblanc 89 y.o. female Room/Bed: APA19/APA19  Code Status   Code Status: DNR  Home/SNF/Other Home Patient oriented to: self, place, time, and situation Is this baseline? Yes   Triage Complete: Triage complete  Chief Complaint Dysphagia [R13.10]  Triage Note Pt presents with trouble swallowing. Pt with hx of this but states that she has been up all night not able to eat or drink.    Allergies Allergies  Allergen Reactions   Celecoxib Shortness Of Breath   Dexlansoprazole Anaphylaxis and Other (See Comments)    abd pain   Famotidine Other (See Comments)    Makes her feel "very bad"    Level of Care/Admitting Diagnosis ED Disposition     ED Disposition  Admit   Condition  --   Comment  Hospital Area: [facility] [[id number]]  Level of Care: Med-Surg [16]  Covid Evaluation: Asymptomatic - no recent exposure (last 10 days) testing not required  Diagnosis: Dysphagia [016010]  Admitting Physician: Lilyan Gilford [[id number]]  Attending Physician: Lilyan Gilford [[id number]]          B Medical/Surgery History Past Medical History:  Diagnosis Date   Anemia    Asthma    Baker's cyst    Cancer (HCC)    skin Ca- ? basal cell    Chronic back pain    Chronic neck pain    Degenerative joint disease    Left shoulder; cervical spine, knees & hands    Gastroesophageal reflux disease    Hiatal hernia; distal esophageal web requiring dilatation; gastric polyps; gastritis; refuses colonoscopy   GERD (gastroesophageal reflux disease)    History of stress test 1990's   stress test done under the care of Dr. Dietrich Pates & Dr. Allyson Sabal, now being followed by Dr. Wyline Mood- in [place] , recently seen & told to f/U in one yr.    Hyperlipidemia 04/27/2011   Hypertension    Hypothyroidism    Lymphocytic thyroiditis    Mild carotid artery disease (HCC)    Multiple thyroid nodules  2010   Adenomatous; thyroidectomy in 2010   Orthostasis    Osteoarthritis of right knee 07/27/2014   Pneumonia    hosp. for pneumonia- long time ago    Primary localized osteoarthrosis of right shoulder 06/21/2015   Seizures (HCC)    yes- as a child- & into adult years, states she took med. for them at one time, stopped at 32 yrs. of age    Syncope and collapse 2007   Possible CVA in 2012 with left lower extremity weakness; refused hospitalization; CT-Atrophy and chronic microvascular ischemic change.    Past Surgical History:  Procedure Laterality Date   ABDOMINAL HYSTERECTOMY     fibroids   CATARACT EXTRACTION     Bilateral; redo surgery on the right for incomplete primary procedure   COLONOSCOPY  Remote   ESOPHAGOGASTRODUODENOSCOPY  11/2009   Dr. Darrick Penna: probable distal web s/p dilation small hiatal hernia/gastric polyps/mild gastritis, appeared to have narrowing at the junction of D1 and D2 dilated up to 12 mm   ESOPHAGOGASTRODUODENOSCOPY N/A 03/21/2015   Surgeon: West Bali, MD; proximal esophageal web s/p dilation, multiple gastric polyps s/p multiple biopsies, mild erosive gastritis s/p biopsy.  Pathology with fundic gland polyps, chronic gastritis, negative for H. pylori.   EYE SURGERY     PROLAPSED UTERINE FIBROID LIGATION     outcomed with rectocele &  cystocele   SAVORY DILATION N/A 03/21/2015   Procedure: SAVORY DILATION;  Surgeon: West Bali, MD;  Location: [facility];  Service: Endoscopy;  Laterality: N/A;   SHOULDER SURGERY     left   TOTAL KNEE ARTHROPLASTY Right 07/27/2014   Procedure: RIGHT TOTAL KNEE ARTHROPLASTY;  Surgeon: Eulas Post, MD;  Location: [facility] OR;  Service: Orthopedics;  Laterality: Right;   TOTAL SHOULDER ARTHROPLASTY Right 06/21/2015   Procedure: RIGHT TOTAL SHOULDER ARTHROPLASTY;  Surgeon: Teryl Lucy, MD;  Location: [facility] OR;  Service: Orthopedics;  Laterality: Right;   TOTAL THYROIDECTOMY  2010     A IV  Location/Drains/Wounds Patient Lines/Drains/Airways Status     Active Line/Drains/Airways     Name Placement date Placement time Site Days   Peripheral IV 08/15/23 22 G Right Forearm 08/15/23  0515  Forearm  less than 1   Wound / Incision (Open or Dehisced) 10/01/18 Other (Comment) Toe (Comment  which one) 10/01/18  0127  Toe (Comment  which one)  1779            Intake/Output Last 24 hours No intake or output data in the 24 hours ending 08/15/23 1408  Labs/Imaging Results for orders placed or performed during the hospital encounter of 08/14/23 (from the past 48 hour(s))  Comprehensive metabolic panel     Status: Abnormal   Collection Time: 08/15/23 12:03 AM  Result Value Ref Range   Sodium 136 135 - 145 mmol/L   Potassium 3.5 3.5 - 5.1 mmol/L   Chloride 101 98 - 111 mmol/L   CO2 26 22 - 32 mmol/L   Glucose, Bld 105 (H) 70 - 99 mg/dL    Comment: Glucose reference range applies only to samples taken after fasting for at least 8 hours.   BUN 20 8 - 23 mg/dL   Creatinine, Ser 1.61 0.44 - 1.00 mg/dL   Calcium 8.9 8.9 - 09.6 mg/dL   Total Protein 6.8 6.5 - 8.1 g/dL   Albumin 3.3 (L) 3.5 - 5.0 g/dL   AST 16 15 - 41 U/L   ALT 11 0 - 44 U/L   Alkaline Phosphatase 63 38 - 126 U/L   Total Bilirubin 0.5 0.3 - 1.2 mg/dL   GFR, Estimated 58 (L) >60 mL/min    Comment: (NOTE) Calculated using the CKD-EPI Creatinine Equation (2021)    Anion gap 9 5 - 15    Comment: Performed at [facility], [address] [place], [place] [address]  CBC with Differential     Status: Abnormal   Collection Time: 08/15/23 12:03 AM  Result Value Ref Range   WBC 8.0 4.0 - 10.5 K/uL   RBC 3.21 (L) 3.87 - 5.11 MIL/uL   Hemoglobin 9.8 (L) 12.0 - 15.0 g/dL   HCT 98.1 (L) 19.1 - 47.8 %   MCV 94.4 80.0 - 100.0 fL   MCH 30.5 26.0 - 34.0 pg   MCHC 32.3 30.0 - 36.0 g/dL   RDW 29.5 62.1 - 30.8 %   Platelets 167 150 - 400 K/uL   nRBC 0.0 0.0 - 0.2 %   Neutrophils Relative % 58 %   Neutro Abs 4.7 1.7 - 7.7  K/uL   Lymphocytes Relative 22 %   Lymphs Abs 1.7 0.7 - 4.0 K/uL   Monocytes Relative 9 %   Monocytes Absolute 0.7 0.1 - 1.0 K/uL   Eosinophils Relative 10 %   Eosinophils Absolute 0.8 (H) 0.0 - 0.5 K/uL   Basophils Relative 1 %  Basophils Absolute 0.1 0.0 - 0.1 K/uL   Immature Granulocytes 0 %   Abs Immature Granulocytes 0.01 0.00 - 0.07 K/uL    Comment: Performed at [facility], [address], [place], [place] [address]   DG Chest 2 View  Result Date: 08/15/2023 CLINICAL DATA:  Difficulty swallowing EXAM: CHEST - 2 VIEW COMPARISON:  06/25/2023 FINDINGS: Cardiac shadow is within normal limits. Aortic calcifications are seen. The lungs are well aerated bilaterally. No focal infiltrate or effusion is seen. Postsurgical changes in the shoulder joints are seen. IMPRESSION: No acute abnormality noted. Electronically Signed   By: Alcide Clever M.D.   On: 08/15/2023 03:08    Pending Labs Unresulted Labs (From admission, onward)     Start     Ordered   08/16/23 0500  Comprehensive metabolic panel  Tomorrow morning,   R        08/15/23 0538   08/16/23 0500  Magnesium  Tomorrow morning,   R        08/15/23 0538   08/16/23 0500  CBC with Differential/Platelet  Tomorrow morning,   R        08/15/23 0538   08/16/23 0500  TSH  Tomorrow morning,   R        08/15/23 0538            Vitals/Pain Today's Vitals   08/15/23 1000 08/15/23 1100 08/15/23 1200 08/15/23 1300  BP: (!) 158/84 (!) 150/74 (!) 164/74 (!) 150/85  Pulse: 91 81 83 81  Resp: 16 16 18 18   Temp:      TempSrc:      SpO2: 93% 97% 96% 98%  PainSc:        Isolation Precautions No active isolations  Medications Medications  heparin injection 5,000 Units (5,000 Units Subcutaneous Given 08/15/23 0628)  acetaminophen (TYLENOL) tablet 650 mg (has no administration in time range)    Or  acetaminophen (TYLENOL) suppository 650 mg (has no administration in time range)  morphine (PF) 2 MG/ML injection 2 mg (has no  administration in time range)  ondansetron (ZOFRAN) tablet 4 mg (has no administration in time range)    Or  ondansetron (ZOFRAN) injection 4 mg (has no administration in time range)  feeding supplement (ENSURE ENLIVE / ENSURE PLUS) liquid 237 mL (237 mLs Oral Given 08/15/23 0905)  pantoprazole (PROTONIX) injection 40 mg (40 mg Intravenous Given 08/15/23 0905)  alum & mag hydroxide-simeth (MAALOX/MYLANTA) 200-200-20 MG/5ML suspension 30 mL (30 mLs Oral Given 08/15/23 0201)  lidocaine (XYLOCAINE) 2 % viscous mouth solution 15 mL (15 mLs Mouth/Throat Given 08/15/23 0201)    Mobility walks with device     Focused Assessments    R Recommendations: See Admitting Provider Note  Report given to:   Additional Notes:

## 2023-08-15 NOTE — ED Notes (Signed)
Pt did not tolerate lidocaine well. She began coughing with congested cough present. SPO2 maintained mid 90's. Suction utilized for secretions. Lung sounds wheezing and crackling in upper lobes. EDP made aware. Christine Leblanc

## 2023-08-15 NOTE — ED Notes (Signed)
Another PO challenge initiated but with applesauce. She was able to swallow the applesauce but continued to complain about feeling like the applesauce was stuck in her mouth. RT Koury and this RN both assessed mouth and did not see remnants of applesauce. Bsuper RN

## 2023-08-15 NOTE — Assessment & Plan Note (Addendum)
-   Continue Synthroid after patient has been evaluated by speech therapy and can restart p.o. medications - Check TSH especially in the setting of generalized weakness

## 2023-08-15 NOTE — TOC CM/SW Note (Signed)
Transition of Care [facility]) - Inpatient Brief Assessment   Patient Details  Name: Christine Leblanc MRN: [id number] Date of Birth: January 03, 1929  Transition of Care [facility]) CM/SW Contact:    Villa Herb, LCSWA Phone Number: 08/15/2023, 3:20 PM   Clinical Narrative: TOC following for speech and palliative recommendations. Once pt has been seen by both disciplines TOC will follow for D/C planning. TOC to follow.   Transition of Care Asessment: Insurance and Status: Insurance coverage has been reviewed Patient has primary care physician: Yes Home environment has been reviewed: from home Prior level of function:: some assistance Prior/Current Home Services: No current home services Social Determinants of Health Reivew: SDOH reviewed no interventions necessary Readmission risk has been reviewed: Yes Transition of care needs: no transition of care needs at this time

## 2023-08-15 NOTE — ED Provider Notes (Signed)
EMERGENCY DEPARTMENT AT [facility] Provider Note   CSN: [id number] Arrival date & time: 08/14/23  2102     History  Chief Complaint  Patient presents with   Trouble Swallowing     Christine Leblanc is a 89 y.o. female.  Presents to the emergency department with problems swallowing.  This is a chronic problem for her.  Patient has a history of chronic dysphagia.  In addition to this, she has a history of GERD which causes problems similar to what she is experiencing tonight.  She reports especially in the mornings, she feels like she has thick mucus in the back of her throat and this causes her to have trouble swallowing her pills.       Home Medications Prior to Admission medications   Medication Sig Start Date End Date Taking? Authorizing Provider  albuterol (VENTOLIN HFA) 108 (90 Base) MCG/ACT inhaler Inhale 1-2 puffs into the lungs every 6 (six) hours as needed for wheezing or shortness of breath. Wheezing, Asthma Symptoms 06/23/22   Dimple Nanas, MD  aspirin EC 81 MG tablet Take 81 mg by mouth daily. Swallow whole.    [provider]  Carbinoxamine Maleate 4 MG TABS Take 1 tablet (4 mg total) by mouth at bedtime. 01/02/23 04/02/23  Alfonse Spruce, MD  chlorhexidine (PERIDEX) 0.12 % solution 15 mLs 2 (two) times daily. 05/10/23   [provider]  diazepam (VALIUM) 5 MG tablet Take 5 mg by mouth daily as needed. Patient not taking: Reported on 06/26/2023 05/29/23   [provider]  ferrous sulfate 325 (65 FE) MG tablet Take 325 mg by mouth. Takes one on Monday, Wednesday and Friday    [provider]  guaiFENesin (MUCINEX) 600 MG 12 hr tablet Take 600 mg by mouth 2 (two) times daily.    [provider]  HYDROcodone-acetaminophen (NORCO/VICODIN) 5-325 MG tablet Take one tablet by mouth evyer 8 hours as needed for pain    [provider]  ipratropium (ATROVENT) 0.06 % nasal spray USE 2 SPRAYS IN [facility]  NOSTRIL THREE TIMES A DAY 07/25/22   Alfonse Spruce, MD  levothyroxine (SYNTHROID) 88 MCG tablet TAKE 1 TABLET DAILY ON MONDAY THROUGH SATURDAY AND ONE-HALF (1/2) TABLET ON SUNDAY 10/10/22   Reather Littler, MD  OVER THE COUNTER MEDICATION Chest congestion relief and mucus relief dm daily    [provider]  pantoprazole (PROTONIX) 40 MG tablet Take 1 tablet (40 mg total) by mouth 2 (two) times daily. 06/26/23   Letta Median, PA-C  pravastatin (PRAVACHOL) 40 MG tablet TAKE 2 TABLETS DAILY 07/26/22   Iran Ouch, Lennart Pall, PA-C  senna (SENOKOT) 8.6 MG tablet Take 2 tablets by mouth at bedtime.    [provider]  sucralfate (CARAFATE) 1 GM/10ML suspension Take 10 mLs (1 g total) by mouth 4 (four) times daily -  with meals and at bedtime. 06/20/23   Mesner, Barbara Cower, MD      Allergies    Celecoxib, Dexlansoprazole, and Famotidine    Review of Systems   Review of Systems  Physical Exam Updated Vital Signs BP (!) 168/96   Pulse 85   Temp 98.6 F (37 C) (Oral)   Resp 16   SpO2 92%  Physical Exam Vitals and nursing note reviewed.  Constitutional:      General: She is not in acute distress.    Appearance: She is well-developed.  HENT:     Head: Normocephalic and atraumatic.  Mouth/Throat:     Mouth: Mucous membranes are moist. No oral lesions or angioedema.     Palate: No mass and lesions.     Pharynx: No pharyngeal swelling, oropharyngeal exudate or posterior oropharyngeal erythema.  Eyes:     General: Vision grossly intact. Gaze aligned appropriately.     Extraocular Movements: Extraocular movements intact.     Conjunctiva/sclera: Conjunctivae normal.  Cardiovascular:     Rate and Rhythm: Normal rate and regular rhythm.     Pulses: Normal pulses.     Heart sounds: Normal heart sounds, S1 normal and S2 normal. No murmur heard.    No friction rub. No gallop.  Pulmonary:     Effort: Pulmonary effort is normal. No respiratory distress.     Breath sounds: Normal  breath sounds.  Abdominal:     General: Bowel sounds are normal.     Palpations: Abdomen is soft.     Tenderness: There is no abdominal tenderness. There is no guarding or rebound.     Hernia: No hernia is present.  Musculoskeletal:        General: No swelling.     Cervical back: Full passive range of motion without pain, normal range of motion and neck supple. No spinous process tenderness or muscular tenderness. Normal range of motion.     Right lower leg: No edema.     Left lower leg: No edema.  Skin:    General: Skin is warm and dry.     Capillary Refill: Capillary refill takes less than 2 seconds.     Findings: No ecchymosis, erythema, rash or wound.  Neurological:     General: No focal deficit present.     Mental Status: She is alert and oriented to person, place, and time.     GCS: GCS eye subscore is 4. GCS verbal subscore is 5. GCS motor subscore is 6.     Cranial Nerves: Cranial nerves 2-12 are intact.     Sensory: Sensation is intact.     Motor: Motor function is intact.     Coordination: Coordination is intact.  Psychiatric:        Attention and Perception: Attention normal.        Mood and Affect: Mood normal.        Speech: Speech normal.        Behavior: Behavior normal.     ED Results / Procedures / Treatments   Labs (all labs ordered are listed, but only abnormal results are displayed) Labs Reviewed  COMPREHENSIVE METABOLIC PANEL - Abnormal; Notable for the following components:      Result Value   Glucose, Bld 105 (*)    Albumin 3.3 (*)    GFR, Estimated 58 (*)    All other components within normal limits  CBC WITH DIFFERENTIAL/PLATELET - Abnormal; Notable for the following components:   RBC 3.21 (*)    Hemoglobin 9.8 (*)    HCT 30.3 (*)    Eosinophils Absolute 0.8 (*)    All other components within normal limits    EKG None  Radiology DG Chest 2 View  Result Date: 08/15/2023 CLINICAL DATA:  Difficulty swallowing EXAM: CHEST - 2 VIEW  COMPARISON:  06/25/2023 FINDINGS: Cardiac shadow is within normal limits. Aortic calcifications are seen. The lungs are well aerated bilaterally. No focal infiltrate or effusion is seen. Postsurgical changes in the shoulder joints are seen. IMPRESSION: No acute abnormality noted. Electronically Signed   By: Alcide Clever M.D.   On:  08/15/2023 03:08    Procedures Procedures    Medications Ordered in ED Medications  alum & mag hydroxide-simeth (MAALOX/MYLANTA) 200-200-20 MG/5ML suspension 30 mL (30 mLs Oral Given 08/15/23 0201)  lidocaine (XYLOCAINE) 2 % viscous mouth solution 15 mL (15 mLs Mouth/Throat Given 08/15/23 0201)    ED Course/ Medical Decision Making/ A&P                                 Medical Decision Making Amount and/or Complexity of Data Reviewed Radiology: ordered.  Risk OTC drugs. Prescription drug management.   Patient presents to the emergency department for evaluation of difficulty swallowing.  Patient has a history of chronic dysphagia.  She has been evaluated by speech therapy and has a history of aspiration.  It sounds as though she has not been following her thickened diet.  In addition to this, she reports that she wakes up early in the morning and has thick mucus stuck in the back of her throat.  In the past this has been attributed at least partially to reflux.  Patient reports that she has not been able to eat or drink all day today because of the symptoms.  She says that she was seen in the ED with similar symptoms once before and got better with a GI cocktail.  She was given a GI cocktail and this seemed to worsen her symptoms.  She had difficulty swallowing it, began coughing and this precipitated increased secretions that needed to be deep suctioned.  An attempt to give her applesauce after she recovered from this was unsuccessful.  Patient is reportedly waiting for home speech therapy evaluation.  She does not feel comfortable going home at this time, reports  that multiple times she has felt like "she is going to die" because of this problem.        Final Clinical Impression(s) / ED Diagnoses Final diagnoses:  Dysphagia, unspecified type    Rx / DC Orders ED Discharge Orders     None         Zvi Duplantis, Canary Brim, MD 08/15/23 [phone]

## 2023-08-15 NOTE — Evaluation (Signed)
Clinical/Bedside Swallow Evaluation Patient Details  Name: Christine Leblanc MRN: [id number] Date of Birth: [birth year]  Today's Date: 08/15/2023 Time: SLP Start Time (ACUTE ONLY): 1600 SLP Stop Time (ACUTE ONLY): 1650 SLP Time Calculation (min) (ACUTE ONLY): 50 min  Past Medical History:  Past Medical History:  Diagnosis Date   Anemia    Asthma    Baker's cyst    Cancer (HCC)    skin Ca- ? basal cell    Chronic back pain    Chronic neck pain    Degenerative joint disease    Left shoulder; cervical spine, knees & hands    Gastroesophageal reflux disease    Hiatal hernia; distal esophageal web requiring dilatation; gastric polyps; gastritis; refuses colonoscopy   GERD (gastroesophageal reflux disease)    History of stress test 1990's   stress test done under the care of Dr. Dietrich Pates & Dr. Allyson Sabal, now being followed by Dr. Wyline Mood- in [place] , recently seen & told to f/U in one yr.    Hyperlipidemia 04/27/2011   Hypertension    Hypothyroidism    Lymphocytic thyroiditis    Mild carotid artery disease (HCC)    Multiple thyroid nodules 2010   Adenomatous; thyroidectomy in 2010   Orthostasis    Osteoarthritis of right knee 07/27/2014   Pneumonia    hosp. for pneumonia- long time ago    Primary localized osteoarthrosis of right shoulder 06/21/2015   Seizures (HCC)    yes- as a child- & into adult years, states she took med. for them at one time, stopped at 32 yrs. of age    Syncope and collapse 2007   Possible CVA in 2012 with left lower extremity weakness; refused hospitalization; CT-Atrophy and chronic microvascular ischemic change.    Past Surgical History:  Past Surgical History:  Procedure Laterality Date   ABDOMINAL HYSTERECTOMY     fibroids   CATARACT EXTRACTION     Bilateral; redo surgery on the right for incomplete primary procedure   COLONOSCOPY  Remote   ESOPHAGOGASTRODUODENOSCOPY  11/2009   Dr. Darrick Penna: probable distal web s/p dilation small hiatal hernia/gastric  polyps/mild gastritis, appeared to have narrowing at the junction of D1 and D2 dilated up to 12 mm   ESOPHAGOGASTRODUODENOSCOPY N/A 03/21/2015   Surgeon: West Bali, MD; proximal esophageal web s/p dilation, multiple gastric polyps s/p multiple biopsies, mild erosive gastritis s/p biopsy.  Pathology with fundic gland polyps, chronic gastritis, negative for H. pylori.   EYE SURGERY     PROLAPSED UTERINE FIBROID LIGATION     outcomed with rectocele & cystocele   SAVORY DILATION N/A 03/21/2015   Procedure: SAVORY DILATION;  Surgeon: West Bali, MD;  [facility]: [facility];  Service: Endoscopy;  Laterality: N/A;   SHOULDER SURGERY     left   TOTAL KNEE ARTHROPLASTY Right 07/27/2014   Procedure: RIGHT TOTAL KNEE ARTHROPLASTY;  Surgeon: Eulas Post, MD;  Location: [facility];  Service: Orthopedics;  Laterality: Right;   TOTAL SHOULDER ARTHROPLASTY Right 06/21/2015   Procedure: RIGHT TOTAL SHOULDER ARTHROPLASTY;  Surgeon: Teryl Lucy, MD;  Location: [facility];  Service: Orthopedics;  Laterality: Right;   TOTAL THYROIDECTOMY  2010   HPI:  Christine Leblanc is a 89 y.o. female.     Presents to the emergency department with problems swallowing.  This is a chronic problem for her.  Patient has a history of chronic dysphagia.  In addition to this, she has a history of GERD which causes problems similar to  what she is experiencing tonight.  She reports especially in the mornings, she feels like she has thick mucus in the back of her throat and this causes her to have trouble swallowing her pills.  BSE requested.   MBSS from July 2024 <<Pt presents with stable severe oropharyngeal dysphagia. Biomechanical parameters and dysphagic outcomes appear largely unchanged compared to last evaluation. Today Pt presents with decreased coordination of the swallow, little/no epiglottic deflection, diminished pharyngeal stripping wave and decreased laryngeal vestibule closure which results in trace to moderate amounts of  silent aspiration of all liquid consistencies. Moderate to majority of contrast remains in the pharyngeal space (significant valleculae residue) after the initial swallow, worsening with thicker/heavier consistencies. Head turn to the left with a slight chin tuck with swallows does seem to facilitate pharyngeal clearance in conjunction with liquid wash to clear residue.  Pt with wet vocal quality and intermittent throat clears and some coughing throughout, but Pt was never able to remove aspirates from trachea. In contrasting liquid consistencies, visualized more aspiration of thin liquids with the initial swallow but increased pharyngeal residue of NTL and HTL. Residue mixes with secretions and is evenutally aspirated; With noting these impairments there still does not appear to be an appreciable difference in thin versus thickened liquids. The barium tablet became lodged in the valleculae and was not cleared which was found to be similar in comparing report/imaging from recent MBSS (09/25/23). Pt consumes protien shakes and D2/puree diet at home and is reportedly maintaining her weight; Alternative means of nutrition has been recommended and declined many times. Pt appears to have been managing her severe dysphagia for several years without getting PNA.  Recommend continue with puree/D2 diet and thin liquids and crush medications as able and take in puree/yogurt and follow with liquid wash. Despite Pt's oropharyngeal dysphagia being chronic and unchanged, Recommend HH ST to provide therapy to reinforce strategies and an exercise program (per request of Pt and Pt's son). Reviewed study and recommendations at length with Pt and her son.>>    Assessment / Plan / Recommendation  Clinical Impression  Pt known to SLP service for dysphagia intervention, as she has known oropharyngeal dysphagia with suspected esophageal component. Pt recently had MBSS , see results above. Pt's daughter in room and asking if Pt can  have oral suction provided at home when the mucous gets as bad as it was last night. SLP reviewed imaging and recommendations from recent MBSS with Pt and daughter. Pt is able to take puree and thins at this time and presents with wet vocal quality and multiple swallows, which is her baseline. Pt reports some relief of excessive mucous when she has come to the ED and received what sounds like a GI cocktail. Pt likely with both esophageal and pharyngeal component (she has prominent cricopharyngeus and small Zenker's diverticulum which fills and backflows into pharynx and some is aspirated). Pt again expresses that she does not want a feeding tube and also understands that it would not prevent aspiration, but would supplement nutrition. Recommend D2 and thin liquids with pills crushed as able in puree. Pt may wish to try baking soda oral rinse to see if this helps mucous. She can come back to outpatient SLP therapy if she desires for short term dysphagia therapy and education if needed. SLP will sign off at this time. Above to Pt, daughter, and Charity fundraiser.  SLP Visit Diagnosis: Dysphagia, oropharyngeal phase (R13.12)    Aspiration Risk  Moderate aspiration risk;Risk for inadequate nutrition/hydration  Diet Recommendation Dysphagia 2 (Fine chop);Thin liquid    Liquid Administration via: No straw Medication Administration: Crushed with puree Supervision: Patient able to self feed Compensations: Slow rate;Small sips/bites;Multiple dry swallows after each bite/sip (head turn left and swallow) Postural Changes: Seated upright at 90 degrees;Remain upright for at least 30 minutes after po intake    Other  Recommendations Oral Care Recommendations: Oral care BID Caregiver Recommendations: Have oral suction available (consider oral suction at home)    Recommendations for follow up therapy are one component of a multi-disciplinary discharge planning process, led by the attending physician.  Recommendations may be  updated based on patient status, additional functional criteria and insurance authorization.  Follow up Recommendations Outpatient SLP      Assistance Recommended at Discharge    Functional Status Assessment Patient has had a recent decline in their functional status and/or demonstrates limited ability to make significant improvements in function in a reasonable and predictable amount of time  Frequency and Duration            Prognosis Prognosis for improved oropharyngeal function: Guarded Barriers to Reach Goals: Severity of deficits;Time post onset      Swallow Study   General Date of Onset: 08/14/23 HPI: LINNAE ANGIOLILLO is a 89 y.o. female.     Presents to the emergency department with problems swallowing.  This is a chronic problem for her.  Patient has a history of chronic dysphagia.  In addition to this, she has a history of GERD which causes problems similar to what she is experiencing tonight.  She reports especially in the mornings, she feels like she has thick mucus in the back of her throat and this causes her to have trouble swallowing her pills.  BSE requested. Type of Study: Bedside Swallow Evaluation Previous Swallow Assessment: MBSS July 2024 Diet Prior to this Study: Dysphagia 1 (pureed);Dysphagia 2 (finely chopped);Thin liquids (Level 0) Temperature Spikes Noted: No Respiratory Status: Room air History of Recent Intubation: No Behavior/Cognition: Alert;Cooperative;Pleasant mood Oral Cavity Assessment: Within Functional Limits Oral Care Completed by SLP: No Oral Cavity - Dentition: Edentulous Vision: Functional for self-feeding Self-Feeding Abilities: Able to feed self Patient Positioning: Upright in bed Baseline Vocal Quality: Normal;Wet Volitional Cough: Strong;Congested Volitional Swallow: Able to elicit    Oral/Motor/Sensory Function Overall Oral Motor/Sensory Function: Mild impairment Lingual Strength: Reduced Mandible: Within Functional Limits   Ice  Chips Ice chips: Not tested   Thin Liquid Thin Liquid: Impaired Presentation: Cup;Spoon;Self Fed Pharyngeal  Phase Impairments: Wet Vocal Quality    Nectar Thick Nectar Thick Liquid: Not tested   Honey Thick Honey Thick Liquid: Not tested   Puree Puree: Impaired Presentation: Self Fed;Spoon Oral Phase Functional Implications: Prolonged oral transit Pharyngeal Phase Impairments: Multiple swallows   Solid     Solid: Not tested     Thank you,  Havery Moros, CCC-SLP [phone]  Pascal Stiggers 08/15/2023,5:00 PM

## 2023-08-15 NOTE — ED Notes (Signed)
RN retrieved strawberry ensure for patient off of unit 300. Pt is unable to drink at this time. Product is in the ED fridge with a patient label. Pt is aware to call nurses station when she is ready for it. Pt ate around 15% of breakfast

## 2023-08-15 NOTE — Assessment & Plan Note (Signed)
-   Albumin 3.3 which is down from 3.5 - Protein shake at bedside, continue protein shakes - Secondary to poor p.o. intake given dysphagia, see assessment and plan for dysphagia - Continue to monitor

## 2023-08-15 NOTE — H&P (Signed)
History and Physical    Patient: Christine Leblanc AVW:[id number] DOB: September 06, 1929 DOA: 08/14/2023 DOS: the patient was seen and examined on 08/15/2023 PCP: Benita Stabile, MD  Patient coming from: Home  Chief Complaint:  Chief Complaint  Patient presents with   Trouble Swallowing    HPI: Christine Leblanc is a 89 y.o. female with medical history significant of anemia, GERD, hyperlipidemia, hypertension, hypothyroidism, coronary artery disease, history of cancer, and more presents to the ED with a chief complaint of dysphagia.  Apparently this is a chronic problem that has recently become worse.  Patient reports it has been going on for about 8 years.  She reports she was last hospitalized for it 6 months ago.  Patient reports that at home they have tried Protonix, Mucinex, Sudafed.  She reports the Protonix was helping for a while.  At her last hospitalization she had a GI cocktail with lidocaine which seem to help then but did not help this time.  She has been taking 24-hour Sudafed which seems to decrease her mucus.  They describe this problem as a mucous problem and a dysphagia problem.  Patient cannot swallow solid foods, but she is also having a lot of postnasal drip it sounds like.  They have her on Zyrtec.  They report that the timing of her dysphagia is worse right when she gets up and right before she goes to bed.  It seems to be little better during the day.  At home she has been eating pured foods, protein drinks, mashed potatoes, small cakes, and soft foods like eggplant.  She saw speech therapist in July, and was supposed to have speech therapy set up at home but it somehow fell through.  Patient reports episodes of choking and gagging.  She has not had any dyspnea or fever.  She has had some coughing productive of sputum but no hemoptysis.  Patient has no nausea or vomiting.  Patient has no other complaints at this time.  Patient does not smoke and does not drink.  Patient is DNR.  She also  reports that she would not want a feeding tube. Review of Systems: As mentioned in the history of present illness. All other systems reviewed and are negative. Past Medical History:  Diagnosis Date   Anemia    Asthma    Baker's cyst    Cancer (HCC)    skin Ca- ? basal cell    Chronic back pain    Chronic neck pain    Degenerative joint disease    Left shoulder; cervical spine, knees & hands    Gastroesophageal reflux disease    Hiatal hernia; distal esophageal web requiring dilatation; gastric polyps; gastritis; refuses colonoscopy   GERD (gastroesophageal reflux disease)    History of stress test 1990's   stress test done under the care of Dr. Dietrich Pates & Dr. Allyson Sabal, now being followed by Dr. Wyline Mood- in [place] , recently seen & told to f/U in one yr.    Hyperlipidemia 04/27/2011   Hypertension    Hypothyroidism    Lymphocytic thyroiditis    Mild carotid artery disease (HCC)    Multiple thyroid nodules 2010   Adenomatous; thyroidectomy in 2010   Orthostasis    Osteoarthritis of right knee 07/27/2014   Pneumonia    hosp. for pneumonia- long time ago    Primary localized osteoarthrosis of right shoulder 06/21/2015   Seizures (HCC)    yes- as a child- & into adult years, states she took  med. for them at one time, stopped at 32 yrs. of age    Syncope and collapse 2007   Possible CVA in 2012 with left lower extremity weakness; refused hospitalization; CT-Atrophy and chronic microvascular ischemic change.    Past Surgical History:  Procedure Laterality Date   ABDOMINAL HYSTERECTOMY     fibroids   CATARACT EXTRACTION     Bilateral; redo surgery on the right for incomplete primary procedure   COLONOSCOPY  Remote   ESOPHAGOGASTRODUODENOSCOPY  11/2009   Dr. Darrick Penna: probable distal web s/p dilation small hiatal hernia/gastric polyps/mild gastritis, appeared to have narrowing at the junction of D1 and D2 dilated up to 12 mm   ESOPHAGOGASTRODUODENOSCOPY N/A 03/21/2015   Surgeon:  West Bali, MD; proximal esophageal web s/p dilation, multiple gastric polyps s/p multiple biopsies, mild erosive gastritis s/p biopsy.  Pathology with fundic gland polyps, chronic gastritis, negative for H. pylori.   EYE SURGERY     PROLAPSED UTERINE FIBROID LIGATION     outcomed with rectocele & cystocele   SAVORY DILATION N/A 03/21/2015   Procedure: SAVORY DILATION;  Surgeon: West Bali, MD;  [facility]: [facility];  Service: Endoscopy;  Laterality: N/A;   SHOULDER SURGERY     left   TOTAL KNEE ARTHROPLASTY Right 07/27/2014   Procedure: RIGHT TOTAL KNEE ARTHROPLASTY;  Surgeon: Eulas Post, MD;  Location: [facility];  Service: Orthopedics;  Laterality: Right;   TOTAL SHOULDER ARTHROPLASTY Right 06/21/2015   Procedure: RIGHT TOTAL SHOULDER ARTHROPLASTY;  Surgeon: Teryl Lucy, MD;  Location: [facility];  Service: Orthopedics;  Laterality: Right;   TOTAL THYROIDECTOMY  2010   Social History:  reports that she quit smoking about 44 years ago. Her smoking use included cigarettes. She started smoking about 64 years ago. She has a 16 pack-year smoking history. She has never used smokeless tobacco. She reports current alcohol use. She reports that she does not use drugs.  Allergies  Allergen Reactions   Celecoxib Shortness Of Breath   Dexlansoprazole Anaphylaxis and Other (See Comments)    abd pain   Famotidine Other (See Comments)    Makes her feel "very bad"    Family History  Problem Relation Age of Onset   Heart disease Mother    Gallbladder disease Mother    Heart failure Mother    Diabetes Mother    Breast cancer Sister        30 years ago   Diabetes Sister    Heart attack Father    Diabetes Maternal Grandmother    Colon cancer Neg Hx    Stroke Neg Hx     Prior to Admission medications   Medication Sig Start Date End Date Taking? Authorizing Provider  albuterol (VENTOLIN HFA) 108 (90 Base) MCG/ACT inhaler Inhale 1-2 puffs into the lungs every 6 (six) hours as needed for  wheezing or shortness of breath. Wheezing, Asthma Symptoms 06/23/22   Dimple Nanas, MD  aspirin EC 81 MG tablet Take 81 mg by mouth daily. Swallow whole.    [provider]  Carbinoxamine Maleate 4 MG TABS Take 1 tablet (4 mg total) by mouth at bedtime. 01/02/23 04/02/23  Alfonse Spruce, MD  chlorhexidine (PERIDEX) 0.12 % solution 15 mLs 2 (two) times daily. 05/10/23   [provider]  diazepam (VALIUM) 5 MG tablet Take 5 mg by mouth daily as needed. Patient not taking: Reported on 06/26/2023 05/29/23   [provider]  ferrous sulfate 325 (65 FE) MG tablet Take  325 mg by mouth. Takes one on Monday, Wednesday and Friday    [provider]  guaiFENesin (MUCINEX) 600 MG 12 hr tablet Take 600 mg by mouth 2 (two) times daily.    [provider]  HYDROcodone-acetaminophen (NORCO/VICODIN) 5-325 MG tablet Take one tablet by mouth evyer 8 hours as needed for pain    [provider]  ipratropium (ATROVENT) 0.06 % nasal spray USE 2 SPRAYS IN [facility] NOSTRIL THREE TIMES A DAY 07/25/22   Alfonse Spruce, MD  levothyroxine (SYNTHROID) 88 MCG tablet TAKE 1 TABLET DAILY ON MONDAY THROUGH SATURDAY AND ONE-HALF (1/2) TABLET ON SUNDAY 10/10/22   Reather Littler, MD  OVER THE COUNTER MEDICATION Chest congestion relief and mucus relief dm daily    [provider]  pantoprazole (PROTONIX) 40 MG tablet Take 1 tablet (40 mg total) by mouth 2 (two) times daily. 06/26/23   Letta Median, PA-C  pravastatin (PRAVACHOL) 40 MG tablet TAKE 2 TABLETS DAILY 07/26/22   [place] Ouch, Lennart Pall, PA-C  senna (SENOKOT) 8.6 MG tablet Take 2 tablets by mouth at bedtime.    [provider]  sucralfate (CARAFATE) 1 GM/10ML suspension Take 10 mLs (1 g total) by mouth 4 (four) times daily -  with meals and at bedtime. 06/20/23   Mesner, Barbara Cower, MD    Physical Exam: Vitals:   08/15/23 0200 08/15/23 0350 08/15/23 0415 08/15/23 0500  BP: (!) 168/96  (!) 166/118 (!)  156/71  Pulse: 85 84 96 78  Resp:   15 16  Temp:   97.7 F (36.5 C)   TempSrc:      SpO2: 92% 96% 99% 95%   1.  General: Patient lying supine in bed, thin with symmetric facial muscle atrophy  2. Psychiatric: Alert and oriented x 3, mood and behavior normal for situation, pleasant and cooperative with exam   3. Neurologic: Speech and language are normal, face is symmetric, moves all 4 extremities voluntarily, at baseline without acute deficits on limited exam   4. HEENMT:  Head is atraumatic, normocephalic, pupils reactive to light, neck is supple, trachea is midline, mucous membranes are moist   5. Respiratory : Lungs are clear to auscultation bilaterally without wheezing, rhonchi, rales, no cyanosis, no increase in work of breathing or accessory muscle use   6. Cardiovascular : Heart rate normal, rhythm is regular, no murmurs, rubs or gallops, no peripheral edema, peripheral pulses palpated   7. Gastrointestinal:  Abdomen is soft, nondistended, nontender to palpation bowel sounds active, no masses or organomegaly palpated   8. Skin:  Skin is warm, dry and intact without rashes, acute lesions, or ulcers on limited exam   9.Musculoskeletal:  No acute deformities or trauma, no asymmetry in tone, no peripheral edema, peripheral pulses palpated, no tenderness to palpation in the extremities  Data Reviewed: In the ED Patient is afebrile, heart rate 80-99, respiratory rate 15-16, blood pressure 145/90-176/128 No leukocytosis, hemoglobin stable at 9.8 Chemistry unremarkable Chest x-ray is without acute findings Patient was given Maalox, Mylanta, lidocaine Patient continues to not swallow her saliva, and coughs and spits after p.o. challenge Admission requested for speech therapy eval Assessment and Plan: * Dysphagia - Speech therapy eval and treat - May need TOC consult to set up home speech therapy - Given that this could be related to GERD, continue Protonix IV until  patient is able to tolerate p.o. medications  Gastroesophageal reflux disease - Continue Protonix  Postsurgical hypothyroidism - Continue Synthroid after patient has been  evaluated by speech therapy and can restart p.o. medications - Check TSH especially in the setting of generalized weakness  Protein-calorie malnutrition, mild (HCC) - Albumin 3.3 which is down from 3.5 - Protein shake at bedside, continue protein shakes - Secondary to poor p.o. intake given dysphagia, see assessment and plan for dysphagia - Continue to monitor  Essential hypertension - Holding p.o. medications at this time secondary to dysphagia - Monitor blood pressure - Add as needed antihypertensive if indicated  Dyslipidemia - Continue statin when patient is able to tolerate p.o. medications      Advance Care Planning:   Code Status: DNR  Consults: Speech therapy  Family Communication: Son at bedside  Severity of Illness: The appropriate patient status for this patient is OBSERVATION. Observation status is judged to be reasonable and necessary in order to provide the required intensity of service to ensure the patient's safety. The patient's presenting symptoms, physical exam findings, and initial radiographic and laboratory data in the context of their medical condition is felt to place them at decreased risk for further clinical deterioration. Furthermore, it is anticipated that the patient will be medically stable for discharge from the hospital within 2 midnights of admission.   Author: Lilyan Gilford, DO 08/15/2023 6:22 AM  For on call review [email or web address].

## 2023-08-15 NOTE — Discharge Instructions (Signed)
1)Please follow up with Christine Leblanc your speech pathologist/outpatient as previously discussed -Home health speech therapy services has been requested and discussed -Avoid using excessive amount of Maalox as this can cause diarrhea -Please follow-up with a dentist to discuss ways to address your dental concerns -Soft, fine chopped dysphagia  2 type diet advised

## 2023-08-15 NOTE — Discharge Summary (Signed)
Christine Leblanc, is a 89 y.o. female  DOB 01/24/1929  MRN [id number].  Admission date:  08/14/2023  Admitting Physician  Lilyan Gilford, DO  Discharge Date:  08/15/2023   Primary MD  Benita Stabile, MD  Recommendations for primary care physician for things to follow:   1)Please follow up with Douglass Rivers your speech pathologist/outpatient as previously discussed -Home health speech therapy services has been requested and discussed -Avoid using excessive amount of Maalox as this can cause diarrhea -Please follow-up with a dentist to discuss ways to address your dental concerns -Soft, fine chopped dysphagia  2 type diet advised  Admission Diagnosis  Dysphagia [R13.10] Dysphagia, unspecified type [R13.10]  Discharge Diagnosis  Dysphagia [R13.10] Dysphagia, unspecified type [R13.10]    Principal Problem:   Dysphagia Active Problems:   Gastroesophageal reflux disease   Postsurgical hypothyroidism   Protein-calorie malnutrition, mild (HCC)   Dyslipidemia   Essential hypertension      Past Medical History:  Diagnosis Date   Anemia    Asthma    Baker's cyst    Cancer (HCC)    skin Ca- ? basal cell    Chronic back pain    Chronic neck pain    Degenerative joint disease    Left shoulder; cervical spine, knees & hands    Gastroesophageal reflux disease    Hiatal hernia; distal esophageal web requiring dilatation; gastric polyps; gastritis; refuses colonoscopy   GERD (gastroesophageal reflux disease)    History of stress test 1990's   stress test done under the care of Dr. Dietrich Pates & Dr. Allyson Sabal, now being followed by Dr. Wyline Mood- in [place] , recently seen & told to f/U in one yr.    Hyperlipidemia 04/27/2011   Hypertension    Hypothyroidism    Lymphocytic thyroiditis    Mild carotid artery disease (HCC)    Multiple thyroid nodules 2010   Adenomatous; thyroidectomy in 2010   Orthostasis     Osteoarthritis of right knee 07/27/2014   Pneumonia    hosp. for pneumonia- long time ago    Primary localized osteoarthrosis of right shoulder 06/21/2015   Seizures (HCC)    yes- as a child- & into adult years, states she took med. for them at one time, stopped at 32 yrs. of age    Syncope and collapse 2007   Possible CVA in 2012 with left lower extremity weakness; refused hospitalization; CT-Atrophy and chronic microvascular ischemic change.     Past Surgical History:  Procedure Laterality Date   ABDOMINAL HYSTERECTOMY     fibroids   CATARACT EXTRACTION     Bilateral; redo surgery on the right for incomplete primary procedure   COLONOSCOPY  Remote   ESOPHAGOGASTRODUODENOSCOPY  11/2009   Dr. Darrick Penna: probable distal web s/p dilation small hiatal hernia/gastric polyps/mild gastritis, appeared to have narrowing at the junction of D1 and D2 dilated up to 12 mm   ESOPHAGOGASTRODUODENOSCOPY N/A 03/21/2015   Surgeon: West Bali, MD; proximal esophageal web s/p dilation, multiple gastric polyps s/p multiple biopsies, mild erosive  gastritis s/p biopsy.  Pathology with fundic gland polyps, chronic gastritis, negative for H. pylori.   EYE SURGERY     PROLAPSED UTERINE FIBROID LIGATION     outcomed with rectocele & cystocele   SAVORY DILATION N/A 03/21/2015   Procedure: SAVORY DILATION;  Surgeon: West Bali, MD;  [facility]: [facility];  Service: Endoscopy;  Laterality: N/A;   SHOULDER SURGERY     left   TOTAL KNEE ARTHROPLASTY Right 07/27/2014   Procedure: RIGHT TOTAL KNEE ARTHROPLASTY;  Surgeon: Eulas Post, MD;  Location: [facility];  Service: Orthopedics;  Laterality: Right;   TOTAL SHOULDER ARTHROPLASTY Right 06/21/2015   Procedure: RIGHT TOTAL SHOULDER ARTHROPLASTY;  Surgeon: Teryl Lucy, MD;  Location: [facility];  Service: Orthopedics;  Laterality: Right;   TOTAL THYROIDECTOMY  2010     HPI  from the history and physical done on the day of admission:   HPI: Christine Leblanc is  a 89 y.o. female with medical history significant of anemia, GERD, hyperlipidemia, hypertension, hypothyroidism, coronary artery disease, history of cancer, and more presents to the ED with a chief complaint of dysphagia.  Apparently this is a chronic problem that has recently become worse.  Patient reports it has been going on for about 8 years.  She reports she was last hospitalized for it 6 months ago.  Patient reports that at home they have tried Protonix, Mucinex, Sudafed.  She reports the Protonix was helping for a while.  At her last hospitalization she had a GI cocktail with lidocaine which seem to help then but did not help this time.  She has been taking 24-hour Sudafed which seems to decrease her mucus.  They describe this problem as a mucous problem and a dysphagia problem.  Patient cannot swallow solid foods, but she is also having a lot of postnasal drip it sounds like.  They have her on Zyrtec.  They report that the timing of her dysphagia is worse right when she gets up and right before she goes to bed.  It seems to be little better during the day.  At home she has been eating pured foods, protein drinks, mashed potatoes, small cakes, and soft foods like eggplant.  She saw speech therapist in July, and was supposed to have speech therapy set up at home but it somehow fell through.  Patient reports episodes of choking and gagging.  She has not had any dyspnea or fever.  She has had some coughing productive of sputum but no hemoptysis.  Patient has no nausea or vomiting.  Patient has no other complaints at this time.   Patient does not smoke and does not drink.  Patient is DNR.  She also reports that she would not want a feeding tube. Review of Systems: As mentioned in the history of present illness. All other systems reviewed and are negative.    Hospital Course:   Assessment and Plan: 1) oral discomfort and concerns about possible dysphagia - Patient has some dental implants and one of  them appears to be rubbing against the ground and oral mucosa causing discomfort  -speech therapy eval appreciated, recommends dysphagia 2/fine chopped solids and thin liquids -Patient previously saw Douglass Rivers the speech therapist as outpatient---she would like to follow-up with her -Denies any significant GERD/reflux type symptoms  2)Postsurgical hypothyroidism  TSH was 3.8 about 6 months ago - Continue levothyroxine  3)Protein-calorie malnutrition, mild (HCC) - Albumin 3.3  -Most likely #1 it is impacting patient's ability to  have adequate oral intake -Small frequent meals advised  4)HLD=--continue Pravastatin  5)Chronic Anemia--Hgb 9.8 which is similar to priors -Continue iron supplementation  Discharge plan discussed with patient,-Her son [name] and her daughter Lawson Fiscal at bedside, questions answered  Discharge Condition: stable  Follow UP--- with speech pathologist Lauris Poag and with dentist  Diet and Activity recommendation:  As advised  Discharge Instructions    Discharge Instructions     Call MD for:  difficulty breathing, headache or visual disturbances   Complete by: As directed    Call MD for:  persistant dizziness or light-headedness   Complete by: As directed    Call MD for:  persistant nausea and vomiting   Complete by: As directed    Call MD for:  temperature >100.4   Complete by: As directed    Diet general   Complete by: As directed    Soft, fine chopped dysphagia  2 type diet advised   Discharge instructions   Complete by: As directed    1)Please follow up with Douglass Rivers your speech pathologist/outpatient as previously discussed -Home health speech therapy services has been requested and discussed -Avoid using excessive amount of Maalox as this can cause diarrhea -Please follow-up with a dentist to discuss ways to address your dental concerns -Soft, fine chopped dysphagia  2 type diet advised   Increase activity slowly   Complete by: As  directed        Discharge Medications     Allergies as of 08/15/2023       Reactions   Celecoxib Shortness Of Breath   Dexlansoprazole Anaphylaxis, Other (See Comments)   abd pain   Famotidine Other (See Comments)   Makes her feel "very bad"        Medication List     TAKE these medications    acetaminophen 325 MG tablet Commonly known as: TYLENOL Take 2 tablets (650 mg total) by mouth every 6 (six) hours as needed for mild pain (or Fever >/= 101).   albuterol 108 (90 Base) MCG/ACT inhaler Commonly known as: VENTOLIN HFA Inhale 1-2 puffs into the lungs every 6 (six) hours as needed for wheezing or shortness of breath. Wheezing, Asthma Symptoms   aspirin EC 81 MG tablet Take 81 mg by mouth daily. Swallow whole.   Carbinoxamine Maleate 4 MG Tabs Take 1 tablet (4 mg total) by mouth at bedtime.   cetirizine 10 MG tablet Commonly known as: ZYRTEC Take 10 mg by mouth daily.   diazepam 5 MG tablet Commonly known as: VALIUM Take 5 mg by mouth daily as needed.   ferrous sulfate 325 (65 FE) MG tablet Take 325 mg by mouth. Takes one on Monday, Wednesday and Friday   HYDROcodone-acetaminophen 5-325 MG tablet Commonly known as: NORCO/VICODIN Take one tablet by mouth evyer 8 hours as needed for pain   ipratropium 0.06 % nasal spray Commonly known as: ATROVENT USE 2 SPRAYS IN EACH NOSTRIL THREE TIMES A DAY   levothyroxine 88 MCG tablet Commonly known as: SYNTHROID TAKE 1 TABLET DAILY ON MONDAY THROUGH SATURDAY AND ONE-HALF (1/2) TABLET ON SUNDAY   magic mouthwash w/lidocaine Soln Take 5 mLs by mouth 2 (two) times daily as needed for mouth pain.   OVER THE COUNTER MEDICATION Chest congestion relief and mucus relief dm daily   pravastatin 40 MG tablet Commonly known as: PRAVACHOL TAKE 2 TABLETS DAILY   senna 8.6 MG tablet Commonly known as: SENOKOT Take 2 tablets by mouth at bedtime.  Major procedures and Radiology Reports - PLEASE review detailed  and final reports for all details, in brief -   DG Chest 2 View  Result Date: 08/15/2023 CLINICAL DATA:  Difficulty swallowing EXAM: CHEST - 2 VIEW COMPARISON:  06/25/2023 FINDINGS: Cardiac shadow is within normal limits. Aortic calcifications are seen. The lungs are well aerated bilaterally. No focal infiltrate or effusion is seen. Postsurgical changes in the shoulder joints are seen. IMPRESSION: No acute abnormality noted. Electronically Signed   By: Alcide Clever M.D.   On: 08/15/2023 03:08   DG OP Swallowing Func-Medicare/Speech Path  Result Date: 07/24/2023 Table formatting from the original result was not included. Modified Barium Swallow Study Patient Details Name: CHAYLIN KNAGGS MRN: [id number] Date of Birth: [birth year] Today's Date: 07/24/2023 HPI/PMH: Ms. Keishla Nadolny is a 89 year old female with history of GERD, severe oropharyngeal dysphagia, weight loss, constipation, who recently (06/26/23) presented to gastroenterology with chief complaint of coughing up mucus.  This has been an ongoing issue for her.  She feels that she frequently has to clear her throat and will spend hours trying to cough "mucus" up. Pt has a long-standing ST hx and known chronic severe oropharyngeal dysphagia; most recent MBSS completed 09/25/23 revealing aspiration of all liquid consistencies recommendation for considering alternative means of nutrition and D1/D2 diet with thin liquids. Pts weight is stable and she has not had recurrent respiratory illness but Pt and her son are requesting ongoing discussion/counseling on her current oropharyngeal dysfunction and question additional recommendations/exercises. MBSS requested by GI to objectively re-assess Pt's swallowing function and provide support and additional recommendations Clinical Impression: Pt presents with stable severe oropharyngeal dysphagia. Biomechanical parameters and dysphagic outcomes appear largely unchanged compared to last evaluation. Today Pt presents with  decreased coordination of the swallow, little/no epiglottic deflection, diminished pharyngeal stripping wave and decreased laryngeal vestibule closure which results in trace to moderate amounts of silent aspiration of all liquid consistencies. Moderate to majority of contrast remains in the pharyngeal space (significant valleculae residue) after the initial swallow, worsening with thicker/heavier consistencies. Head turn to the left with a slight chin tuck with swallows does seem to facilitate pharyngeal clearance in conjunction with liquid wash.  Pt with wet vocal quality and intermittent throat clears and some coughing throughout, but Pt was never able to remove aspirates from trachea. In contrasting liquid consistencies, visualized more aspiration of thin liquids with the initial swallow but increased pharyngeal residue of NTL and HTL. Residue mixes with secretions and is evenutally aspirated; With noting these impairments there still does not appear to be an appreciable difference in thin versus thickened liquids. The barium tablet became lodged in the valleculae and was not cleared which was found to be similar in comparing report/imaging from recent MBSS (09/25/23). Pt consumes protien shakes and D2/puree diet at home and is reportedly maintaining her weight; Alternative means of nutrition has been recommended and declined many times. Pt appears to have been managing her severe dysphagia for several years without getting PNA.  Recommend continue with puree/D2 diet and thin liquids and crush medications as able and take in puree/yogurt and follow with liquid wash. Despite Pt's oropharyngeal dysphagia being chronic and largely unchanged, Recommend HH ST to provide therapy to reinforce strategies and an exercise program (per request of Pt and Pt's son). Reviewed study and recommendations at length with Pt and her son. Factors that may increase risk of adverse event in presence of aspiration Christine Leblanc & Clearance Christine Leblanc  2021): No data recorded Recommendations/Plan: Swallowing Evaluation  Recommendations Swallowing Evaluation Recommendations Recommendations: PO diet PO Diet Recommendation: Dysphagia 1 (Pureed); Thin liquids (Level 0); Dysphagia 2 (Finely chopped) Liquid Administration via: Cup; Straw Medication Administration: Whole meds with puree Supervision: Patient able to self-feed Swallowing strategies  : Head turn left during swallowing; Chin tuck; Follow solids with liquids Postural changes: Stay upright 30-60 min after meals Oral care recommendations: Oral care QID (4x/day) Treatment Plan Treatment Plan Treatment recommendations: Therapy as outlined in treatment plan below Follow-up recommendations: Home health SLP Recommendations Recommendations for follow up therapy are one component of a multi-disciplinary discharge planning process, led by the attending physician.  Recommendations may be updated based on patient status, additional functional criteria and insurance authorization. Assessment: Orofacial Exam: Orofacial Exam Oral Cavity: Oral Hygiene: WFL Oral Cavity - Dentition: Edentulous Orofacial Anatomy: WFL Oral Motor/Sensory Function: WFL Anatomy: Anatomy: WFL Boluses Administered: Boluses Administered Boluses Administered: Thin liquids (Level 0); Mildly thick liquids (Level 2, nectar thick); Moderately thick liquids (Level 3, honey thick); Puree  Oral Impairment Domain: Oral Impairment Domain Lip Closure: Interlabial escape, no progression to anterior lip (negatively impacted by edentulous status) Tongue control during bolus hold: Escape to lateral buccal cavity/floor of mouth Bolus preparation/mastication: Slow prolonged chewing/mashing with complete recollection Bolus transport/lingual motion: Brisk tongue motion Oral residue: Trace residue lining oral structures; Residue collection on oral structures Location of oral residue : Tongue Initiation of pharyngeal swallow : Pyriform sinuses  Pharyngeal Impairment  Domain: Pharyngeal Impairment Domain Soft palate elevation: No bolus between soft palate (SP)/pharyngeal wall (PW) Laryngeal elevation: Partial superior movement of thyroid cartilage/partial approximation of arytenoids to epiglottic petiole Anterior hyoid excursion: Partial anterior movement Epiglottic movement: No inversion Laryngeal vestibule closure: Incomplete, narrow column air/contrast in laryngeal vestibule Pharyngeal stripping wave : Present - diminished Pharyngeal contraction (A/P view only): N/A Pharyngoesophageal segment opening: Minimal distention/minimal duration, marked obstruction of flow Tongue base retraction: Narrow column of contrast or air between tongue base and PPW; Trace column of contrast or air between tongue base and PPW Pharyngeal residue: Collection of residue within or on pharyngeal structures; Majority of contrast within or on pharyngeal structures Location of pharyngeal residue: Pharyngeal wall; Aryepiglottic folds; Pyriform sinuses; Diffuse (>3 areas); Valleculae; Tongue base  Esophageal Impairment Domain: Esophageal Impairment Domain Esophageal clearance upright position: Esophageal retention with retrograde flow below pharyngoesophageal segment (PES) (very small zenker's diverticulum) Pill: Pill Consistency administered: Puree Penetration/Aspiration Scale Score: Penetration/Aspiration Scale Score 8.  Material enters airway, passes BELOW cords without attempt by patient to eject out (silent aspiration) : Thin liquids (Level 0); Mildly thick liquids (Level 2, nectar thick) Compensatory Strategies: Compensatory Strategies Compensatory strategies: Yes Chin tuck: Effective Left head turn: Effective Chin tuck combined with head turn: Effective   General Information: Caregiver present: Yes (Son,)  Diet Prior to this Study: Dysphagia 1 (pureed); Dysphagia 2 (finely chopped); Thin liquids (Level 0)   No data recorded  Respiratory Status: WFL   Supplemental O2: None (Room air)   History of  Recent Intubation: No  Behavior/Cognition: Alert; Cooperative; Pleasant mood Self-Feeding Abilities: Able to self-feed Baseline vocal quality/speech: Normal Volitional Cough: Able to elicit Volitional Swallow: Able to elicit No data recorded Goal Planning: Prognosis for improved oropharyngeal function: Guarded No data recorded No data recorded No data recorded Consulted and agree with results and recommendations: Patient; Family member/caregiver Pain: Pain Assessment Pain Assessment: No/denies pain End of Session: Start Time:No data recorded Stop Time: No data recorded Time Calculation:No data recorded Charges: No data recorded SLP visit diagnosis: SLP Visit Diagnosis: Dysphagia,  oropharyngeal phase (R13.12) Past Medical History: Past Medical History: Diagnosis Date  Anemia   Asthma   Baker's cyst   Cancer (HCC)   skin Ca- ? basal cell   Chronic back pain   Chronic neck pain   Degenerative joint disease   Left shoulder; cervical spine, knees & hands   Gastroesophageal reflux disease   Hiatal hernia; distal esophageal web requiring dilatation; gastric polyps; gastritis; refuses colonoscopy  GERD (gastroesophageal reflux disease)   History of stress test 1990's  stress test done under the care of Dr. Dietrich Pates & Dr. Allyson Sabal, now being followed by Dr. Wyline Mood- in [place] , recently seen & told to f/U in one yr.   Hyperlipidemia 04/27/2011  Hypertension   Hypothyroidism   Lymphocytic thyroiditis   Mild carotid artery disease (HCC)   Multiple thyroid nodules 2010  Adenomatous; thyroidectomy in 2010  Orthostasis   Osteoarthritis of right knee 07/27/2014  Pneumonia   hosp. for pneumonia- long time ago   Primary localized osteoarthrosis of right shoulder 06/21/2015  Seizures (HCC)   yes- as a child- & into adult years, states she took med. for them at one time, stopped at 32 yrs. of age   Syncope and collapse 2007  Possible CVA in 2012 with left lower extremity weakness; refused hospitalization; CT-Atrophy and chronic  microvascular ischemic change.  Past Surgical History: Past Surgical History: Procedure Laterality Date  ABDOMINAL HYSTERECTOMY    fibroids  CATARACT EXTRACTION    Bilateral; redo surgery on the right for incomplete primary procedure  COLONOSCOPY  Remote  ESOPHAGOGASTRODUODENOSCOPY  11/2009  Dr. Darrick Penna: probable distal web s/p dilation small hiatal hernia/gastric polyps/mild gastritis, appeared to have narrowing at the junction of D1 and D2 dilated up to 12 mm  ESOPHAGOGASTRODUODENOSCOPY N/A 03/21/2015  Surgeon: West Bali, MD; proximal esophageal web s/p dilation, multiple gastric polyps s/p multiple biopsies, mild erosive gastritis s/p biopsy.  Pathology with fundic gland polyps, chronic gastritis, negative for H. pylori.  EYE SURGERY    PROLAPSED UTERINE FIBROID LIGATION    outcomed with rectocele & cystocele  SAVORY DILATION N/A 03/21/2015  Procedure: SAVORY DILATION;  Surgeon: West Bali, MD;  [facility]: [facility];  Service: Endoscopy;  Laterality: N/A;  SHOULDER SURGERY    left  TOTAL KNEE ARTHROPLASTY Right 07/27/2014  Procedure: RIGHT TOTAL KNEE ARTHROPLASTY;  Surgeon: Eulas Post, MD;  Location: [facility];  Service: Orthopedics;  Laterality: Right;  TOTAL SHOULDER ARTHROPLASTY Right 06/21/2015  Procedure: RIGHT TOTAL SHOULDER ARTHROPLASTY;  Surgeon: Teryl Lucy, MD;  Location: [facility];  Service: Orthopedics;  Laterality: Right;  TOTAL THYROIDECTOMY  2010 Amelia H. Romie Levee, CCC-SLP Speech Language Pathologist Georgetta Haber 07/24/2023, 3:26 PM CLINICAL DATA:  Dysphagia. Cough/GE reflux disease/other secondary diagnosis EXAM: MODIFIED BARIUM SWALLOW TECHNIQUE: Different consistencies of barium were administered orally to the patient by the Speech Pathologist. Imaging of the pharynx was performed in the lateral projection. The radiologist was present in the fluoroscopy room for this study, providing personal supervision. FLUOROSCOPY: Radiation Exposure Index (as provided by the fluoroscopic  device): 10.3 mGy Kerma COMPARISON:  09/24/2022 FINDINGS: Vestibular Penetration: Positive for laryngeal penetration as before most pronounced with thin liquids. Aspiration: Positive for intermittent silent aspiration most pronounced with thin liquids. Other: Significant vallecular and piriform sinus residuals noted during the exam. Half of a barium tablet administered with applesauce, which remain lodged in the vallecula and could not be completely swallow. IMPRESSION: Significant swallowing dysfunction as above. Similar to the prior study. Please refer  to the Speech Pathologists report for complete details and recommendations. Electronically Signed   By: Judie Petit.  Shick M.D.   On: 07/24/2023 14:36   Today   Subjective    Christine Leblanc today has no new concerns - Oral intake is fair -Her son [name] and her daughter Lawson Fiscal at bedside, questions answered          Patient has been seen and examined prior to discharge   Objective   Blood pressure (!) 141/76, pulse 90, temperature 98.2 F (36.8 C), resp. rate 16, height 5\' 1"  (1.549 m), SpO2 99%.  Exam Gen:- Awake Alert, no acute distress , frail and thin appearing HEENT:- [place].AT, No sclera icterus, edentulous (took out  dental implants) Neck-Supple Neck,No JVD,.  Lungs-  CTAB , good air movement bilaterally CV- S1, S2 normal, regular Abd-  +ve B.Sounds, Abd Soft, No tenderness,    Extremity/Skin:- No  edema,   good pulses Psych-affect is appropriate, oriented x3, pretty good memory and cognitive function for age Neuro-generalized weakness, no new focal deficits, no tremors    Data Review   CBC w Diff:  Lab Results  Component Value Date   WBC 8.0 08/15/2023   HGB 9.8 (L) 08/15/2023   HGB 11.2 05/01/2016   HCT 30.3 (L) 08/15/2023   HCT 33.8 (L) 05/01/2016   PLT 167 08/15/2023   PLT 181 05/01/2016   LYMPHOPCT 22 08/15/2023   MONOPCT 9 08/15/2023   EOSPCT 10 08/15/2023   BASOPCT 1 08/15/2023    CMP:  Lab Results  Component Value Date    NA 136 08/15/2023   NA 144 12/11/2019   K 3.5 08/15/2023   CL 101 08/15/2023   CO2 26 08/15/2023   BUN 20 08/15/2023   BUN 21 12/11/2019   CREATININE 0.92 08/15/2023   CREATININE 0.73 09/20/2016   GLU 91 12/11/2019   PROT 6.8 08/15/2023   PROT 6.7 05/21/2017   ALBUMIN 3.3 (L) 08/15/2023   BILITOT 0.5 08/15/2023   ALKPHOS 63 08/15/2023   AST 16 08/15/2023   ALT 11 08/15/2023  .  Total Discharge time is about 33 minutes  Shon Hale M.D on 08/15/2023 at 7:08 PM  Go to [email or web address] -  for contact info  [facility] - Office  [phone]

## 2023-08-15 NOTE — Assessment & Plan Note (Signed)
-   Speech therapy eval and treat - May need TOC consult to set up home speech therapy - Given that this could be related to GERD, continue Protonix IV until patient is able to tolerate p.o. medications

## 2023-08-15 NOTE — ED Notes (Signed)
Patient escorted to restroom. Pt bed and blankest straightened. Pt assisted to be comfortable. Pt breakfast tray set up with all needs met. Son is at bedside to assist if necessary.

## 2023-08-15 NOTE — Assessment & Plan Note (Signed)
Continue Protonix °

## 2023-08-15 NOTE — Progress Notes (Signed)
Palliative: Thank you for this consult. Unfortunately due to high volume of consults there will be a delay in a Palliative Provider seeing this patient. Palliative Medicine will return to service on 08/16/23 and will see patient at that time.  No charge Lillia Carmel, NP Palliative Medicine Please call Palliative Medicine team phone with any questions [phone]. For individual providers please see [facility]ION

## 2023-08-15 NOTE — ED Notes (Signed)
Pt was able to drink very small sips of water. Shortly after she began spitting. Per son, patient will do this every day for hours.  No coughing or shortness of breath upon PO challenge. Bsuper RN

## 2023-08-15 NOTE — Assessment & Plan Note (Addendum)
-   Holding p.o. medications at this time secondary to dysphagia - Monitor blood pressure - Add as needed antihypertensive if indicated

## 2023-08-19 DIAGNOSIS — M1712 Unilateral primary osteoarthritis, left knee: Secondary | ICD-10-CM | POA: Diagnosis not present

## 2023-08-20 DIAGNOSIS — M6281 Muscle weakness (generalized): Secondary | ICD-10-CM | POA: Diagnosis not present

## 2023-08-20 DIAGNOSIS — R262 Difficulty in walking, not elsewhere classified: Secondary | ICD-10-CM | POA: Diagnosis not present

## 2023-08-20 DIAGNOSIS — M25662 Stiffness of left knee, not elsewhere classified: Secondary | ICD-10-CM | POA: Diagnosis not present

## 2023-08-20 DIAGNOSIS — M1712 Unilateral primary osteoarthritis, left knee: Secondary | ICD-10-CM | POA: Diagnosis not present

## 2023-08-22 ENCOUNTER — Other Ambulatory Visit: Payer: Self-pay | Admitting: Student

## 2023-08-23 DIAGNOSIS — Z713 Dietary counseling and surveillance: Secondary | ICD-10-CM | POA: Diagnosis not present

## 2023-08-23 DIAGNOSIS — Z7182 Exercise counseling: Secondary | ICD-10-CM | POA: Diagnosis not present

## 2023-08-23 DIAGNOSIS — R1310 Dysphagia, unspecified: Secondary | ICD-10-CM | POA: Diagnosis not present

## 2023-08-23 DIAGNOSIS — B370 Candidal stomatitis: Secondary | ICD-10-CM | POA: Diagnosis not present

## 2023-08-23 DIAGNOSIS — Z681 Body mass index (BMI) 19 or less, adult: Secondary | ICD-10-CM | POA: Diagnosis not present

## 2023-08-23 DIAGNOSIS — K121 Other forms of stomatitis: Secondary | ICD-10-CM | POA: Diagnosis not present

## 2023-09-03 ENCOUNTER — Inpatient Hospital Stay: Payer: Medicare Other

## 2023-09-04 DIAGNOSIS — M1712 Unilateral primary osteoarthritis, left knee: Secondary | ICD-10-CM | POA: Diagnosis not present

## 2023-09-10 ENCOUNTER — Inpatient Hospital Stay: Payer: Medicare Other | Admitting: Physician Assistant

## 2023-09-20 DIAGNOSIS — Z23 Encounter for immunization: Secondary | ICD-10-CM | POA: Diagnosis not present

## 2023-09-20 DIAGNOSIS — Z9189 Other specified personal risk factors, not elsewhere classified: Secondary | ICD-10-CM | POA: Diagnosis not present

## 2023-09-20 DIAGNOSIS — E782 Mixed hyperlipidemia: Secondary | ICD-10-CM | POA: Diagnosis not present

## 2023-09-20 DIAGNOSIS — N1831 Chronic kidney disease, stage 3a: Secondary | ICD-10-CM | POA: Diagnosis not present

## 2023-09-20 DIAGNOSIS — M7989 Other specified soft tissue disorders: Secondary | ICD-10-CM | POA: Diagnosis not present

## 2023-09-20 DIAGNOSIS — D649 Anemia, unspecified: Secondary | ICD-10-CM | POA: Diagnosis not present

## 2023-09-20 DIAGNOSIS — R259 Unspecified abnormal involuntary movements: Secondary | ICD-10-CM | POA: Diagnosis not present

## 2023-09-20 DIAGNOSIS — E039 Hypothyroidism, unspecified: Secondary | ICD-10-CM | POA: Diagnosis not present

## 2023-09-20 DIAGNOSIS — G4700 Insomnia, unspecified: Secondary | ICD-10-CM | POA: Diagnosis not present

## 2023-09-20 DIAGNOSIS — R1310 Dysphagia, unspecified: Secondary | ICD-10-CM | POA: Diagnosis not present

## 2023-09-20 DIAGNOSIS — E43 Unspecified severe protein-calorie malnutrition: Secondary | ICD-10-CM | POA: Diagnosis not present

## 2023-09-20 DIAGNOSIS — R7301 Impaired fasting glucose: Secondary | ICD-10-CM | POA: Diagnosis not present

## 2023-09-25 ENCOUNTER — Other Ambulatory Visit: Payer: Self-pay | Admitting: Allergy & Immunology

## 2023-09-26 ENCOUNTER — Telehealth: Payer: Self-pay | Admitting: *Deleted

## 2023-09-26 NOTE — Telephone Encounter (Signed)
Per my last note, patient should be taking pantoprazole 40 mg twice daily.  Does she need a refill?

## 2023-09-26 NOTE — Telephone Encounter (Signed)
Please clarify if pt is to be taking omeprazole or pantoprazole. Received refilled request. Last OV 06/26/2023

## 2023-09-27 ENCOUNTER — Other Ambulatory Visit: Payer: Self-pay | Admitting: Gastroenterology

## 2023-09-27 ENCOUNTER — Encounter: Payer: Self-pay | Admitting: Hematology

## 2023-09-27 DIAGNOSIS — K219 Gastro-esophageal reflux disease without esophagitis: Secondary | ICD-10-CM

## 2023-09-27 MED ORDER — PANTOPRAZOLE SODIUM 40 MG PO TBEC
40.0000 mg | DELAYED_RELEASE_TABLET | Freq: Every day | ORAL | 3 refills | Status: DC
Start: 2023-09-27 — End: 2023-10-31

## 2023-09-27 NOTE — Telephone Encounter (Signed)
She needs a refill on pantoprazole.

## 2023-09-27 NOTE — Telephone Encounter (Signed)
Rx sent 

## 2023-09-27 NOTE — Telephone Encounter (Signed)
Noted  

## 2023-10-03 ENCOUNTER — Ambulatory Visit (INDEPENDENT_AMBULATORY_CARE_PROVIDER_SITE_OTHER): Payer: Medicare Other | Admitting: Adult Health

## 2023-10-03 ENCOUNTER — Encounter: Payer: Self-pay | Admitting: Adult Health

## 2023-10-03 VITALS — BP 133/63 | HR 93 | Ht 61.0 in | Wt 100.0 lb

## 2023-10-03 DIAGNOSIS — R053 Chronic cough: Secondary | ICD-10-CM | POA: Diagnosis not present

## 2023-10-03 MED ORDER — MONTELUKAST SODIUM 10 MG PO TABS: 10.0000 mg | ORAL_TABLET | Freq: Every day | ORAL | 11 refills | Status: DC

## 2023-10-03 NOTE — Patient Instructions (Addendum)
Continue on Zyrtec 10mg  At bedtime   Begin Singulair 10mg  At bedtime   Delsym 2 tsp Twice daily  for cough for 2 weeks and then As needed   Aspiration precautions as discussed  Continue on Protonix 40mg  daily  Albuterol inhaler 2 puffs every 6hr as needed .  Follow up with Dr. Sherene Sires or Andranik Jeune NP  in 3 months and As needed  -(30 min slot )

## 2023-10-03 NOTE — Assessment & Plan Note (Signed)
Chronic cough/upper airway cough syndrome patient may have a component of asthma/reactive airways.  Suspect chronic rhinitis/postnasal drip and/or reflux are contributing. For now we will direct therapy at cough control regimen with cough syrup, antihistamine and PPI therapy. If continues or worsens will set patient up for PFTs and CT chest. She has no significant shortness of breath or wheezing.  Will hold off on maintenance inhaler at this time.  Can use albuterol as needed.  Plan  Patient Instructions  Continue on Zyrtec 10mg  At bedtime   Begin Singulair 10mg  At bedtime   Delsym 2 tsp Twice daily  for cough for 2 weeks and then As needed   Aspiration precautions as discussed  Continue on Protonix 40mg  daily  Albuterol inhaler 2 puffs every 6hr as needed .  Follow up with Dr. Sherene Sires or Skarlette Lattner NP  in 3 months and As needed  -(30 min slot )

## 2023-10-03 NOTE — Progress Notes (Signed)
@Patient  ID: Christine Leblanc, female    DOB: Nov 11, 1929, 89 y.o.   MRN: [id number]  Chief Complaint  Patient presents with   Consult    Referring provider: Benita Stabile, MD  HPI: 89 yo female former smoker seen pulmonary consult 10/03/23 for chronic cough   TEST/EVENTS :  2D echo June 2021 EF 70-75%  CXR 08/2023 clear   10/03/2023 Consult : Chronic cough  Patient presents for a pulmonary consult.  Kindly referred by her primary care provider Dr. Margo Aye.  Patient is accompanied by her son.  Patient says that earlier this summer she had a cough that was quite severe at times.  Has been going on for a while but for what ever reason few months ago was really bad for several weeks.  Chest x-ray showed no acute process.  Patient says she took some over-the-counter cough medicines and Tessalon Perles which seem to help.  She recently started on Zyrtec every day.  Cough has been better over the last 4 to 6 weeks.  Cough is minimally productive.  She has no hemoptysis, fever or discolored mucus.  Lab work does show elevated eosinophils with absolute eosinophil count at 800.  She says she has a history of chronic allergies and intermittent asthma was previously on allergy shots years ago.  Patient says she has a basement but does never go in there.  No hot tub no birds no chickens no pets.  Lives at home with her 2 children.  Patient says she is somewhat sedentary mainly due to her knee issues.  But if it was not for her knees was previously very active up until a year or 2 ago.  She denies any significant shortness of breath. Does have a history of dysphagia is on swallow precautions.  She is also on Protonix daily. She is a former smoker but quit years ago.  She denies any unintentional weight loss or hemoptysis   Allergies  Allergen Reactions   Celecoxib Shortness Of Breath   Dexlansoprazole Anaphylaxis and Other (See Comments)    abd pain   Famotidine Other (See Comments)    Makes her feel "very  bad"    Immunization History  Administered Date(s) Administered   Influenza, High Dose Seasonal PF 09/11/2018, 12/03/2019, 10/23/2022   Influenza-Unspecified 12/16/2015   Moderna Sars-Covid-2 Vaccination 02/14/2020, 03/13/2020   Tdap 08/22/2022    Past Medical History:  Diagnosis Date   Anemia    Asthma    Baker's cyst    Cancer (HCC)    skin Ca- ? basal cell    Chronic back pain    Chronic neck pain    Degenerative joint disease    Left shoulder; cervical spine, knees & hands    Gastroesophageal reflux disease    Hiatal hernia; distal esophageal web requiring dilatation; gastric polyps; gastritis; refuses colonoscopy   GERD (gastroesophageal reflux disease)    History of stress test 1990's   stress test done under the care of Dr. Dietrich Pates & Dr. Allyson Sabal, now being followed by Dr. Wyline Mood- in [place] , recently seen & told to f/U in one yr.    Hyperlipidemia 04/27/2011   Hypertension    Hypothyroidism    Lymphocytic thyroiditis    Mild carotid artery disease (HCC)    Multiple thyroid nodules 2010   Adenomatous; thyroidectomy in 2010   Orthostasis    Osteoarthritis of right knee 07/27/2014   Pneumonia    hosp. for pneumonia- long time ago  Primary localized osteoarthrosis of right shoulder 06/21/2015   Seizures (HCC)    yes- as a child- & into adult years, states she took med. for them at one time, stopped at 32 yrs. of age    Syncope and collapse 2007   Possible CVA in 2012 with left lower extremity weakness; refused hospitalization; CT-Atrophy and chronic microvascular ischemic change.     Tobacco History: Social History   Tobacco Use  Smoking Status Former   Current packs/day: 0.00   Average packs/day: 0.8 packs/day for 20.0 years (16.0 ttl pk-yrs)   Types: Cigarettes   Start date: 12/31/1958   Quit date: 12/31/1978   Years since quitting: 44.7  Smokeless Tobacco Never   Counseling given: Not Answered   Outpatient Medications Prior to Visit  Medication Sig  Dispense Refill   acetaminophen (TYLENOL) 325 MG tablet Take 2 tablets (650 mg total) by mouth every 6 (six) hours as needed for mild pain (or Fever >/= 101).     albuterol (VENTOLIN HFA) 108 (90 Base) MCG/ACT inhaler Inhale 1-2 puffs into the lungs every 6 (six) hours as needed for wheezing or shortness of breath. Wheezing, Asthma Symptoms 8 g 0   aspirin EC 81 MG tablet Take 81 mg by mouth daily. Swallow whole.     cetirizine (ZYRTEC) 10 MG tablet Take 10 mg by mouth daily.     diazepam (VALIUM) 5 MG tablet Take 5 mg by mouth daily as needed.     ferrous sulfate 325 (65 FE) MG tablet Take 325 mg by mouth. Takes one on Monday, Wednesday and Friday     guaiFENesin (MUCUS RELIEF PO) Take by mouth.     HYDROcodone-acetaminophen (NORCO/VICODIN) 5-325 MG tablet Take one tablet by mouth evyer 8 hours as needed for pain     ipratropium (ATROVENT) 0.06 % nasal spray USE 2 SPRAYS IN EACH NOSTRIL THREE TIMES A DAY 45 mL 0   levothyroxine (SYNTHROID) 88 MCG tablet TAKE 1 TABLET DAILY ON MONDAY THROUGH SATURDAY AND ONE-HALF (1/2) TABLET ON SUNDAY 90 tablet 3   OVER THE COUNTER MEDICATION Chest congestion relief and mucus relief dm daily     pantoprazole (PROTONIX) 40 MG tablet Take 1 tablet (40 mg total) by mouth daily before breakfast. 30 tablet 3   pravastatin (PRAVACHOL) 40 MG tablet TAKE 2 TABLETS DAILY 180 tablet 0   senna (SENOKOT) 8.6 MG tablet Take 2 tablets by mouth at bedtime.     Carbinoxamine Maleate 4 MG TABS Take 1 tablet (4 mg total) by mouth at bedtime. 90 tablet 4   magic mouthwash w/lidocaine SOLN Take 5 mLs by mouth 2 (two) times daily as needed for mouth pain. (Patient not taking: Reported on 10/03/2023) 30 mL 0   No facility-administered medications prior to visit.     Review of Systems:   Constitutional:   No  weight loss, night sweats,  Fevers, chills, fatigue, or  lassitude.  HEENT:   No headaches,  Difficulty swallowing,  Tooth/dental problems, or  Sore throat,                 No sneezing, itching, ear ache, nasal congestion, post nasal drip,   CV:  No chest pain,  Orthopnea, PND, swelling in lower extremities, anasarca, dizziness, palpitations, syncope.   GI  No heartburn, indigestion, abdominal pain, nausea, vomiting, diarrhea, change in bowel habits, loss of appetite, bloody stools.   Resp: N  No chest wall deformity  Skin: no rash or lesions.  GU: no  dysuria, change in color of urine, no urgency or frequency.  No flank pain, no hematuria   MS:  No joint pain or swelling.  No decreased range of motion.  No back pain.    Physical Exam  BP 133/63   Pulse 93   Ht 5\' 1"  (1.549 m)   Wt 100 lb (45.4 kg)   SpO2 95% Comment: RA  BMI 18.89 kg/m   GEN: A/Ox3; pleasant , NAD, thin and frail in wc    HEENT:  [place]/AT,   NOSE-clear, THROAT-clear, no lesions, no postnasal drip or exudate noted.   NECK:  Supple w/ fair ROM; no JVD; normal carotid impulses w/o bruits; no thyromegaly or nodules palpated; no lymphadenopathy.    RESP  Clear  P & A; w/o, wheezes/ rales/ or rhonchi. no accessory muscle use, no dullness to percussion  CARD:  RRR, no m/r/g, no peripheral edema, pulses intact, no cyanosis or clubbing.  GI:   Soft & nt; nml bowel sounds; no organomegaly or masses detected.   Musco: Warm bil, no deformities or joint swelling noted.   Neuro: alert, no focal deficits noted.    Skin: Warm, no lesions or rashes    Lab Results:  CBC  BNP No results found for: "BNP"  ProBNP No results found for: "PROBNP"  Imaging: No results found.  Administration History     None           No data to display          No results found for: "NITRICOXIDE"      Assessment & Plan:   Chronic cough Chronic cough/upper airway cough syndrome patient may have a component of asthma/reactive airways.  Suspect chronic rhinitis/postnasal drip and/or reflux are contributing. For now we will direct therapy at cough control regimen with cough syrup,  antihistamine and PPI therapy. If continues or worsens will set patient up for PFTs and CT chest. She has no significant shortness of breath or wheezing.  Will hold off on maintenance inhaler at this time.  Can use albuterol as needed.  Plan  Patient Instructions  Continue on Zyrtec 10mg  At bedtime   Begin Singulair 10mg  At bedtime   Delsym 2 tsp Twice daily  for cough for 2 weeks and then As needed   Aspiration precautions as discussed  Continue on Protonix 40mg  daily  Albuterol inhaler 2 puffs every 6hr as needed .  Follow up with Dr. Sherene Sires or Briarrose Shor NP  in 3 months and As needed  -(30 min slot )            Rubye Oaks, NP 10/03/2023

## 2023-10-16 ENCOUNTER — Other Ambulatory Visit: Payer: Self-pay

## 2023-10-16 ENCOUNTER — Inpatient Hospital Stay: Payer: Medicare Other

## 2023-10-16 ENCOUNTER — Encounter (HOSPITAL_COMMUNITY): Payer: Self-pay

## 2023-10-16 ENCOUNTER — Emergency Department (HOSPITAL_COMMUNITY)
Admission: EM | Admit: 2023-10-16 | Discharge: 2023-10-17 | Disposition: A | Discharge status: Home / Self Care | Payer: Medicare Other | Attending: Emergency Medicine | Admitting: Emergency Medicine

## 2023-10-16 DIAGNOSIS — Z7982 Long term (current) use of aspirin: Secondary | ICD-10-CM | POA: Insufficient documentation

## 2023-10-16 DIAGNOSIS — R1310 Dysphagia, unspecified: Secondary | ICD-10-CM | POA: Diagnosis not present

## 2023-10-16 DIAGNOSIS — R1312 Dysphagia, oropharyngeal phase: Secondary | ICD-10-CM | POA: Insufficient documentation

## 2023-10-16 NOTE — ED Triage Notes (Addendum)
Pt c/o dysphagia and mucus reflux. Hx of same. Pt states that when she has had to be suctioned before when this has happened. Pt states he feels like she's "choking". Pt able to speak full sentences, no signs of respiratory distress.

## 2023-10-17 ENCOUNTER — Inpatient Hospital Stay: Payer: Medicare Other

## 2023-10-17 DIAGNOSIS — R1312 Dysphagia, oropharyngeal phase: Secondary | ICD-10-CM | POA: Diagnosis not present

## 2023-10-17 LAB — CBC WITH DIFFERENTIAL/PLATELET
Abs Immature Granulocytes: 0.03 10*3/uL (ref 0.00–0.07)
Basophils Absolute: 0.1 10*3/uL (ref 0.0–0.1)
Basophils Relative: 1 %
Eosinophils Absolute: 0.6 10*3/uL — ABNORMAL HIGH (ref 0.0–0.5)
Eosinophils Relative: 5 %
HCT: 32.6 % — ABNORMAL LOW (ref 36.0–46.0)
Hemoglobin: 10.7 g/dL — ABNORMAL LOW (ref 12.0–15.0)
Immature Granulocytes: 0 %
Lymphocytes Relative: 16 %
Lymphs Abs: 1.9 10*3/uL (ref 0.7–4.0)
MCH: 30.6 pg (ref 26.0–34.0)
MCHC: 32.8 g/dL (ref 30.0–36.0)
MCV: 93.1 fL (ref 80.0–100.0)
Monocytes Absolute: 1.2 10*3/uL — ABNORMAL HIGH (ref 0.1–1.0)
Monocytes Relative: 10 %
Neutro Abs: 8.1 10*3/uL — ABNORMAL HIGH (ref 1.7–7.7)
Neutrophils Relative %: 68 %
Platelets: 208 10*3/uL (ref 150–400)
RBC: 3.5 MIL/uL — ABNORMAL LOW (ref 3.87–5.11)
RDW: 13.9 % (ref 11.5–15.5)
WBC: 12 10*3/uL — ABNORMAL HIGH (ref 4.0–10.5)
nRBC: 0 % (ref 0.0–0.2)

## 2023-10-17 LAB — BASIC METABOLIC PANEL
Anion gap: 8 (ref 5–15)
BUN: 21 mg/dL (ref 8–23)
CO2: 25 mmol/L (ref 22–32)
Calcium: 9.1 mg/dL (ref 8.9–10.3)
Chloride: 102 mmol/L (ref 98–111)
Creatinine, Ser: 0.98 mg/dL (ref 0.44–1.00)
GFR, Estimated: 54 mL/min — ABNORMAL LOW (ref >60)
Glucose, Bld: 109 mg/dL — ABNORMAL HIGH (ref 70–99)
Potassium: 3.8 mmol/L (ref 3.5–5.1)
Sodium: 135 mmol/L (ref 135–145)

## 2023-10-17 MED ORDER — ALUM & MAG HYDROXIDE-SIMETH 200-200-20 MG/5ML PO SUSP
30.0000 mL | Freq: Once | ORAL | Status: AC
  Administered 2023-10-17: 30 mL via ORAL
  Filled 2023-10-17: qty 30

## 2023-10-17 MED ORDER — METHYLPREDNISOLONE SODIUM SUCC 125 MG IJ SOLR
125.0000 mg | Freq: Once | INTRAMUSCULAR | Status: AC
  Administered 2023-10-17: 125 mg via INTRAMUSCULAR
  Filled 2023-10-17: qty 2

## 2023-10-17 MED ORDER — LIDOCAINE VISCOUS HCL 2 % MT SOLN: 15.0000 mL | OROMUCOSAL | 1 refills | Status: DC | PRN

## 2023-10-17 MED ORDER — SUCRALFATE 1 GM/10ML PO SUSP: 1.0000 g | Freq: Three times a day (TID) | ORAL | 0 refills | Status: DC

## 2023-10-17 MED ORDER — LIDOCAINE VISCOUS HCL 2 % MT SOLN
15.0000 mL | Freq: Once | OROMUCOSAL | Status: AC
  Administered 2023-10-17: 15 mL via ORAL
  Filled 2023-10-17: qty 15

## 2023-10-17 NOTE — ED Notes (Signed)
Pt assisted to the restroom via wheelchair

## 2023-10-17 NOTE — ED Provider Notes (Signed)
[place] EMERGENCY DEPARTMENT AT [facility] Provider Note   CSN: [id number] Arrival date & time: 10/16/23  1929     History  Chief Complaint  Patient presents with   Dysphagia    Christine Leblanc is a 89 y.o. female.  89 year old female with a history of allergies, dysphagia and esophageal strictures amongst other things that presents the ER today secondary to the the swallowing.  States lidocaine has helped in the past.  Carafate has helped in the past.  Son thinks that allergy medicines helped in the past but she no longer gets those shots.  Has seen pulmonology and gastroenterology multiple times with similar symptoms.  Has been in the ER few times this year with that as well.  Was post to have speech-language pathology at home based on a barium swallow study from last year however they never came.  Most recently she has been spitting a lot and having a sensation of something stuck in her throat.  Able to eat and drink some but then has a sensation and start spit up a bunch.        Home Medications Prior to Admission medications   Medication Sig Start Date End Date Taking? Authorizing Provider  lidocaine (XYLOCAINE) 2 % solution Use as directed 15 mLs in the mouth or throat every 4 (four) hours as needed for mouth pain. 10/17/23  Yes Demetruis Depaul, Barbara Cower, MD  sucralfate (CARAFATE) 1 GM/10ML suspension Take 10 mLs (1 g total) by mouth 4 (four) times daily -  with meals and at bedtime. 10/17/23  Yes Nashiya Disbrow, Barbara Cower, MD  acetaminophen (TYLENOL) 325 MG tablet Take 2 tablets (650 mg total) by mouth every 6 (six) hours as needed for mild pain (or Fever >/= 101). 08/15/23   Shon Hale, MD  albuterol (VENTOLIN HFA) 108 (90 Base) MCG/ACT inhaler Inhale 1-2 puffs into the lungs every 6 (six) hours as needed for wheezing or shortness of breath. Wheezing, Asthma Symptoms 06/23/22   Stephania Fragmin C, MD  aspirin EC 81 MG tablet Take 81 mg by mouth daily. Swallow whole.    [provider]  Carbinoxamine Maleate 4 MG TABS Take 1 tablet (4 mg total) by mouth at bedtime. 01/02/23 08/15/23  Alfonse Spruce, MD  cetirizine (ZYRTEC) 10 MG tablet Take 10 mg by mouth daily.    [provider]  diazepam (VALIUM) 5 MG tablet Take 5 mg by mouth daily as needed. 05/29/23   [provider]  ferrous sulfate 325 (65 FE) MG tablet Take 325 mg by mouth. Takes one on Monday, Wednesday and Friday    [provider]  guaiFENesin (MUCUS RELIEF PO) Take by mouth.    [provider]  HYDROcodone-acetaminophen (NORCO/VICODIN) 5-325 MG tablet Take one tablet by mouth evyer 8 hours as needed for pain    [provider]  ipratropium (ATROVENT) 0.06 % nasal spray USE 2 SPRAYS IN [facility] NOSTRIL THREE TIMES A DAY 09/25/23   Alfonse Spruce, MD  levothyroxine (SYNTHROID) 88 MCG tablet TAKE 1 TABLET DAILY ON MONDAY THROUGH SATURDAY AND ONE-HALF (1/2) TABLET ON SUNDAY 10/10/22   Reather Littler, MD  montelukast (SINGULAIR) 10 MG tablet Take 1 tablet (10 mg total) by mouth at bedtime. 10/03/23   Parrett, Virgel Bouquet, NP  OVER THE COUNTER MEDICATION Chest congestion relief and mucus relief dm daily    [provider]  pantoprazole (PROTONIX) 40 MG tablet Take 1 tablet (40 mg total) by mouth daily before breakfast. 09/27/23  Ermalinda Memos S, PA-C  pravastatin (PRAVACHOL) 40 MG tablet TAKE 2 TABLETS DAILY 08/22/23   [place] Ouch, Lennart Pall, PA-C  senna (SENOKOT) 8.6 MG tablet Take 2 tablets by mouth at bedtime.    [provider]      Allergies    Celecoxib, Dexlansoprazole, and Famotidine    Review of Systems   Review of Systems  Physical Exam Updated Vital Signs BP (!) 163/89   Pulse 90   Temp 97.9 F (36.6 C) (Oral)   Resp 18   Wt 45.4 kg   SpO2 97%   BMI 18.89 kg/m  Physical Exam Vitals and nursing note reviewed.  Constitutional:      Appearance: She is well-developed.  HENT:     Head: Normocephalic and atraumatic.   Cardiovascular:     Rate and Rhythm: Normal rate and regular rhythm.  Pulmonary:     Effort: No respiratory distress.     Breath sounds: No stridor.  Abdominal:     General: There is no distension.  Musculoskeletal:        General: Normal range of motion.     Cervical back: Normal range of motion.  Skin:    General: Skin is warm and dry.  Neurological:     Mental Status: She is alert.     ED Results / Procedures / Treatments   Labs (all labs ordered are listed, but only abnormal results are displayed) Labs Reviewed  CBC WITH DIFFERENTIAL/PLATELET - Abnormal; Notable for the following components:      Result Value   WBC 12.0 (*)    RBC 3.50 (*)    Hemoglobin 10.7 (*)    HCT 32.6 (*)    Neutro Abs 8.1 (*)    Monocytes Absolute 1.2 (*)    Eosinophils Absolute 0.6 (*)    All other components within normal limits  BASIC METABOLIC PANEL - Abnormal; Notable for the following components:   Glucose, Bld 109 (*)    GFR, Estimated 54 (*)    All other components within normal limits    EKG None  Radiology No results found.  Procedures Procedures    Medications Ordered in ED Medications  alum & mag hydroxide-simeth (MAALOX/MYLANTA) 200-200-20 MG/5ML suspension 30 mL (30 mLs Oral Given 10/17/23 0028)    And  lidocaine (XYLOCAINE) 2 % viscous mouth solution 15 mL (15 mLs Oral Given 10/17/23 0028)  methylPREDNISolone sodium succinate (SOLU-MEDROL) 125 mg/2 mL injection 125 mg (125 mg Intramuscular Given 10/17/23 0158)    ED Course/ Medical Decision Making/ A&P                                 Medical Decision Making Amount and/or Complexity of Data Reviewed Labs: ordered.  Risk OTC drugs. Prescription drug management.   Patient with acute on chronic dysphagia.  On a barium swallow study in the past that showed that she had oral and oropharyngeal dysphagia.  Soundly this is get worse.  A feeding tube is recommended that time and it may be at that point.  She is not  severely dehydrated.  She is swallowing the viscous lidocaine amongst other things here in the ER so no indication for admission.  Suggest to follow back up with her GI doctor for repeat recommendations and also put in orders for home health and allergy follow-up per patient's request.  Will try lidocaine and Carafate again at home.   Final Clinical Impression(s) /  ED Diagnoses Final diagnoses:  Oropharyngeal dysphagia    Rx / DC Orders ED Discharge Orders          Ordered    Ambulatory referral to Allergy        10/17/23 0150    Home Health        10/17/23 0150    Face-to-face encounter (required for Medicare/Medicaid patients)       Comments: I Marily Memos certify that this patient is under my care and that I, or a nurse practitioner or physician's assistant working with me, had a face-to-face encounter that meets the physician face-to-face encounter requirements with this patient on 10/17/2023. The encounter with the patient was in whole, or in part for the following medical condition(s) which is the primary reason for home health care (List medical condition): severe worsening oropharyngeal dsyphagia. Was supposed to have in home SLP services a couple months ago, but no consult done. Further orders per PCP.   10/17/23 0150    sucralfate (CARAFATE) 1 GM/10ML suspension  3 times daily with meals & bedtime        10/17/23 0152    lidocaine (XYLOCAINE) 2 % solution  Every 4 hours PRN        10/17/23 0152              Takila Kronberg, Barbara Cower, MD 10/17/23 [phone]

## 2023-10-18 ENCOUNTER — Inpatient Hospital Stay: Payer: Medicare Other | Attending: Physician Assistant

## 2023-10-18 ENCOUNTER — Ambulatory Visit (HOSPITAL_COMMUNITY)
Admission: RE | Admit: 2023-10-18 | Discharge: 2023-10-18 | Disposition: A | Discharge status: Home / Self Care | Payer: Medicare Other | Source: Ambulatory Visit | Attending: Physician Assistant

## 2023-10-18 DIAGNOSIS — D649 Anemia, unspecified: Secondary | ICD-10-CM | POA: Insufficient documentation

## 2023-10-18 DIAGNOSIS — Z87891 Personal history of nicotine dependence: Secondary | ICD-10-CM | POA: Diagnosis not present

## 2023-10-18 DIAGNOSIS — R5383 Other fatigue: Secondary | ICD-10-CM | POA: Diagnosis not present

## 2023-10-18 DIAGNOSIS — M4180 Other forms of scoliosis, site unspecified: Secondary | ICD-10-CM | POA: Diagnosis not present

## 2023-10-18 DIAGNOSIS — D509 Iron deficiency anemia, unspecified: Secondary | ICD-10-CM

## 2023-10-18 DIAGNOSIS — N1831 Chronic kidney disease, stage 3a: Secondary | ICD-10-CM | POA: Diagnosis not present

## 2023-10-18 DIAGNOSIS — D472 Monoclonal gammopathy: Secondary | ICD-10-CM | POA: Insufficient documentation

## 2023-10-18 DIAGNOSIS — M1712 Unilateral primary osteoarthritis, left knee: Secondary | ICD-10-CM | POA: Diagnosis not present

## 2023-10-18 DIAGNOSIS — Z803 Family history of malignant neoplasm of breast: Secondary | ICD-10-CM | POA: Insufficient documentation

## 2023-10-18 DIAGNOSIS — Z86718 Personal history of other venous thrombosis and embolism: Secondary | ICD-10-CM | POA: Diagnosis not present

## 2023-10-18 LAB — CBC WITH DIFFERENTIAL/PLATELET
Abs Immature Granulocytes: 0.03 10*3/uL (ref 0.00–0.07)
Basophils Absolute: 0 10*3/uL (ref 0.0–0.1)
Basophils Relative: 0 %
Eosinophils Absolute: 0.1 10*3/uL (ref 0.0–0.5)
Eosinophils Relative: 1 %
HCT: 30.3 % — ABNORMAL LOW (ref 36.0–46.0)
Hemoglobin: 9.8 g/dL — ABNORMAL LOW (ref 12.0–15.0)
Immature Granulocytes: 0 %
Lymphocytes Relative: 11 %
Lymphs Abs: 1.4 10*3/uL (ref 0.7–4.0)
MCH: 30.5 pg (ref 26.0–34.0)
MCHC: 32.3 g/dL (ref 30.0–36.0)
MCV: 94.4 fL (ref 80.0–100.0)
Monocytes Absolute: 1.3 10*3/uL — ABNORMAL HIGH (ref 0.1–1.0)
Monocytes Relative: 10 %
Neutro Abs: 10.3 10*3/uL — ABNORMAL HIGH (ref 1.7–7.7)
Neutrophils Relative %: 78 %
Platelets: 215 10*3/uL (ref 150–400)
RBC: 3.21 MIL/uL — ABNORMAL LOW (ref 3.87–5.11)
RDW: 14.3 % (ref 11.5–15.5)
WBC: 13.1 10*3/uL — ABNORMAL HIGH (ref 4.0–10.5)
nRBC: 0 % (ref 0.0–0.2)

## 2023-10-18 LAB — COMPREHENSIVE METABOLIC PANEL
ALT: 15 U/L (ref 0–44)
AST: 30 U/L (ref 15–41)
Albumin: 3.3 g/dL — ABNORMAL LOW (ref 3.5–5.0)
Alkaline Phosphatase: 66 U/L (ref 38–126)
Anion gap: 7 (ref 5–15)
BUN: 25 mg/dL — ABNORMAL HIGH (ref 8–23)
CO2: 26 mmol/L (ref 22–32)
Calcium: 8.7 mg/dL — ABNORMAL LOW (ref 8.9–10.3)
Chloride: 105 mmol/L (ref 98–111)
Creatinine, Ser: 1.02 mg/dL — ABNORMAL HIGH (ref 0.44–1.00)
GFR, Estimated: 52 mL/min — ABNORMAL LOW (ref >60)
Glucose, Bld: 90 mg/dL (ref 70–99)
Potassium: 3.4 mmol/L — ABNORMAL LOW (ref 3.5–5.1)
Sodium: 138 mmol/L (ref 135–145)
Total Bilirubin: 0.6 mg/dL (ref 0.3–1.2)
Total Protein: 7 g/dL (ref 6.5–8.1)

## 2023-10-18 LAB — IRON AND TIBC
Iron: 51 ug/dL (ref 28–170)
Saturation Ratios: 22 % (ref 10.4–31.8)
TIBC: 230 ug/dL — ABNORMAL LOW (ref 250–450)
UIBC: 179 ug/dL

## 2023-10-18 LAB — FERRITIN: Ferritin: 349 ng/mL — ABNORMAL HIGH (ref 11–307)

## 2023-10-18 LAB — LACTATE DEHYDROGENASE: LDH: 151 U/L (ref 98–192)

## 2023-10-21 LAB — KAPPA/LAMBDA LIGHT CHAINS
Kappa free light chain: 49.1 mg/L — ABNORMAL HIGH (ref 3.3–19.4)
Kappa, lambda light chain ratio: 1.93 — ABNORMAL HIGH (ref 0.26–1.65)
Lambda free light chains: 25.4 mg/L (ref 5.7–26.3)

## 2023-10-22 LAB — PROTEIN ELECTROPHORESIS, SERUM
A/G Ratio: 1 (ref 0.7–1.7)
Albumin ELP: 3.3 g/dL (ref 2.9–4.4)
Alpha-1-Globulin: 0.3 g/dL (ref 0.0–0.4)
Alpha-2-Globulin: 0.7 g/dL (ref 0.4–1.0)
Beta Globulin: 0.8 g/dL (ref 0.7–1.3)
Gamma Globulin: 1.4 g/dL (ref 0.4–1.8)
Globulin, Total: 3.2 g/dL (ref 2.2–3.9)
M-Spike, %: 0.3 g/dL — ABNORMAL HIGH
Total Protein ELP: 6.5 g/dL (ref 6.0–8.5)

## 2023-10-22 NOTE — Progress Notes (Unsigned)
[facility] [address], [place] [address]   CLINIC:  Medical Oncology/Hematology  PCP:  Benita Stabile, MD [address] Christine Leblanc [place] [address] [phone]   REASON FOR VISIT:  Follow-up for normocytic anemia + history of DVT + MGUS  PRIOR THERAPY: Oral iron supplement  CURRENT THERAPY: Intermittent IV iron  INTERVAL HISTORY:   [name] 89 y.o. female returns for routine follow-up of normocytic anemia, MGUS, and history of DVT.  She was last seen by Rojelio Brenner PA-C on 05/29/2023.  Since her last office visit, she was hospitalized in August 2024 due to dysphagia.  At today's visit, she reports feeling fair.  She reports some occasional scant rectal bleeding, but denies any major hematochezia, melena, epistaxis, or other sources of blood loss. She has moderate fatigue that she attributes to her knee pain.  She denies any pica, headaches, lightheadedness, or syncope.  She does not have any symptoms of recurrent DVT or new PE.  She denies any new onset bone pain, neurologic changes, or B symptoms.  She continues to take her iron supplement 3 times weekly.  She has 50% energy and 50% appetite. She endorses that she is maintaining a stable weight.  ASSESSMENT & PLAN:  1.  Normocytic anemia (CKD and iron deficiency) - Mild to moderate normocytic anemia since 2019 - History of CKD stage IIIa since 2019 - Hematology workup showed functional iron deficiency with ferritin 228 and percent saturation 19%.  She had normal B12, copper, folic acid, MMA, LDH.  Reticulocytes insufficient for degree of anemia at 1.0%. - No prior history of blood transfusion. - Taking oral iron supplement for >1 year - Received IV iron with Feraheme x 1 on 02/27/2023.  (PREMEDS given due to history of multiple drug allergies.) - Denies rectal bleeding or melena  - Energy at baseline.  No pica.   - Most recent labs (10/18/2023): Hgb 9.8/MCV 94.4.  Leukocytosis noted, but this  appears to be fairly acute (WBC 13.1 with ANC 10.3, monocytes 1.3) - attention at follow-up Ferritin 349, iron saturation 22%. Normal LDH.  Creatinine 1.02/GFR 52 (baseline CKD stage IIIa) - DIFFERENTIAL DIAGNOSIS favors anemia secondary to CKD stage II/IIIa and functional iron deficiency.  She may also have some underlying bone marrow disorder. - PLAN: Continue ferrous sulfate 325 mg 3 times weekly.  No indication for IV iron at this time. - We will continue to monitor and consider starting ESA if she has Hgb consistently <9.5 despite adequate iron stores. - Joint decision-making discussion with patient and son, and we will defer any bone marrow biopsy at this time due to her advanced age and relatively mild degree of anemia.  If she has worsening anemia in the future, we would consider starting ESA, and would reevaluate need for bone marrow biopsy if anemia was unresponsive to ESA. - Anemia follow-up in 3 months   2.   Resolved chronic DVT: - Left leg Doppler (11/01/2022): Chronic appearing DVT in the left profundofemoral vein. - She used Eliquis for 10 days. - Left leg Doppler (11/12/2022): No evidence of femoral-popliteal DVT or superficial phlebitis. - Repeat left leg Doppler (12/07/2022): No evidence of DVT.  Left popliteal fossa Baker's cyst. - She denies any current symptoms concerning for DVT or PE.    3.   IgG kappa monoclonal gammopathy: - Initially evaluated at [facility] for MGUS in June 2018, but was lost to follow-up after December 2020. - Noted again during workup  of anemia with initial labs (02/15/2023): Immunofixation shows IgG kappa SPEP with M spike 0.2.  (stable compared to 2020) Elevated kappa free light chain 63.9, elevated lambda 28.5, mildly elevated free light chain ratio 2.24. - Most recent MGUS/myeloma panel (10/18/2023):  SPEP with stable M-spike 0.3 Elevated kappa 49.1, normal lambda 25.4, mildly elevated FLC ratio 1.93, stable. Hgb 9.8, creatinine 1.02, calcium  8.7.  Normal LDH. -Skeletal survey (10/18/2023): Radiology read pending  - No apparent CRAB features at this time.  Although she does have some anemia, it is mild and considered more likely due to other factors. - We have discussed diagnosis of MGUS including the small but statistically significant risk of progression to multiple myeloma. - PLAN: 24-hour urine - We will follow results of skeletal survey. - Repeat MGUS/myeloma labs in 6 months.  4.  Social/family history: - Lives at home with her disabled daughter.  Activities are limited by left knee pain from arthritis.  Uses walker for ambulation.  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  Quit smoking 50 years ago. - Sister had breast cancer.  PLAN SUMMARY: >> Labs in 3 months = CBC/D, CMP, ferritin, iron/TIBC >> 24-hour urine/UPEP/UIFE >> OFFICE visit in 3 months (1 week after labs)  ((MGUS follow-up in 6 months))     REVIEW OF SYSTEMS:   Review of Systems  Constitutional:  Positive for fatigue. Negative for appetite change, chills, diaphoresis, fever and unexpected weight change.  HENT:   Positive for trouble swallowing. Negative for lump/mass and nosebleeds.   Eyes:  Negative for eye problems.  Respiratory:  Positive for cough (chronic). Negative for hemoptysis and shortness of breath.   Cardiovascular:  Negative for chest pain (from coughing), leg swelling and palpitations.  Gastrointestinal:  Negative for abdominal pain, blood in stool, constipation, diarrhea, nausea and vomiting.  Genitourinary:  Negative for hematuria.   Musculoskeletal:  Positive for arthralgias.  Skin: Negative.   Neurological:  Positive for dizziness. Negative for headaches and light-headedness.  Hematological:  Does not bruise/bleed easily.     PHYSICAL EXAM:  ECOG PERFORMANCE STATUS: 2 - Symptomatic, <50% confined to bed  Vitals:   10/23/23 1341  BP: 139/72  Pulse: 91  Resp: 16  Temp: 97.9 F (36.6 C)  SpO2: 98%    Filed Weights   10/23/23 1341  Weight: 99 lb 13.9 oz (45.3 kg)   Physical Exam Constitutional:      Appearance: Normal appearance. She is underweight.     Comments: Somewhat thin and frail-appearing  Cardiovascular:     Heart sounds: Normal heart sounds.  Pulmonary:     Breath sounds: Normal breath sounds. Decreased air movement present.  Neurological:     General: No focal deficit present.     Mental Status: Mental status is at baseline.  Psychiatric:        Behavior: Behavior normal. Behavior is cooperative.     PAST MEDICAL/SURGICAL HISTORY:  Past Medical History:  Diagnosis Date   Anemia    Asthma    Baker's cyst    Cancer (HCC)    skin Ca- ? basal cell    Chronic back pain    Chronic neck pain    Degenerative joint disease    Left shoulder; cervical spine, knees & hands    Gastroesophageal reflux disease    Hiatal hernia; distal esophageal web requiring dilatation; gastric polyps; gastritis; refuses colonoscopy   GERD (gastroesophageal reflux disease)    History of stress test 1990's  stress test done under the care of Dr. Dietrich Pates & Dr. Allyson Sabal, now being followed by Dr. Wyline Mood- in [place] , recently seen & told to f/U in one yr.    Hyperlipidemia 04/27/2011   Hypertension    Hypothyroidism    Lymphocytic thyroiditis    Mild carotid artery disease (HCC)    Multiple thyroid nodules 2010   Adenomatous; thyroidectomy in 2010   Orthostasis    Osteoarthritis of right knee 07/27/2014   Pneumonia    hosp. for pneumonia- long time ago    Primary localized osteoarthrosis of right shoulder 06/21/2015   Seizures (HCC)    yes- as a child- & into adult years, states she took med. for them at one time, stopped at 32 yrs. of age    Syncope and collapse 2007   Possible CVA in 2012 with left lower extremity weakness; refused hospitalization; CT-Atrophy and chronic microvascular ischemic change.    Past Surgical History:  Procedure Laterality Date   ABDOMINAL  HYSTERECTOMY     fibroids   CATARACT EXTRACTION     Bilateral; redo surgery on the right for incomplete primary procedure   COLONOSCOPY  Remote   ESOPHAGOGASTRODUODENOSCOPY  11/2009   Dr. Darrick Penna: probable distal web s/p dilation small hiatal hernia/gastric polyps/mild gastritis, appeared to have narrowing at the junction of D1 and D2 dilated up to 12 mm   ESOPHAGOGASTRODUODENOSCOPY N/A 03/21/2015   Surgeon: West Bali, MD; proximal esophageal web s/p dilation, multiple gastric polyps s/p multiple biopsies, mild erosive gastritis s/p biopsy.  Pathology with fundic gland polyps, chronic gastritis, negative for H. pylori.   EYE SURGERY     PROLAPSED UTERINE FIBROID LIGATION     outcomed with rectocele & cystocele   SAVORY DILATION N/A 03/21/2015   Procedure: SAVORY DILATION;  Surgeon: West Bali, MD;  Location: [facility];  Service: Endoscopy;  Laterality: N/A;   SHOULDER SURGERY     left   TOTAL KNEE ARTHROPLASTY Right 07/27/2014   Procedure: RIGHT TOTAL KNEE ARTHROPLASTY;  Surgeon: Eulas Post, MD;  Location: [facility] OR;  Service: Orthopedics;  Laterality: Right;   TOTAL SHOULDER ARTHROPLASTY Right 06/21/2015   Procedure: RIGHT TOTAL SHOULDER ARTHROPLASTY;  Surgeon: Teryl Lucy, MD;  Location: [facility] OR;  Service: Orthopedics;  Laterality: Right;   TOTAL THYROIDECTOMY  2010    SOCIAL HISTORY:  Social History   Socioeconomic History   Marital status: Widowed    Spouse name: Not on file   Number of children: 4   Years of education: 50   Highest education level: Not on file  Occupational History   Occupation: Artist    Comment: does not yield regular income   Occupation: Retired    Comment: Engineer, civil (consulting)  Tobacco Use   Smoking status: Former    Current packs/day: 0.00    Average packs/day: 0.8 packs/day for 20.0 years (16.0 ttl pk-yrs)    Types: Cigarettes    Start date: 12/31/1958    Quit date: 12/31/1978    Years since quitting: 44.8   Smokeless tobacco: Never  Vaping Use    Vaping status: Never Used  Substance and Sexual Activity   Alcohol use: Yes    Comment: rarely   Drug use: No   Sexual activity: Never    Birth control/protection: Surgical    Comment: widowed since 2010  Other Topics Concern   Not on file  Social History Narrative   ** Merged History Encounter **       Lives at  home. Her daughter lives with her.  Caffeine use: 1 cup coffee per day    Social Determinants of Health   Financial Resource Strain: Not on file  Food Insecurity: No Food Insecurity (08/15/2023)   Hunger Vital Sign    Worried About Running Out of Food in the Last Year: Never true    Ran Out of Food in the Last Year: Never true  Transportation Needs: No Transportation Needs (08/15/2023)   PRAPARE - Administrator, Civil Service (Medical): No    Lack of Transportation (Non-Medical): No  Physical Activity: Not on file  Stress: Not on file  Social Connections: Not on file  Intimate Partner Violence: Not At Risk (08/15/2023)   Humiliation, Afraid, Rape, and Kick questionnaire    Fear of Current or Ex-Partner: No    Emotionally Abused: No    Physically Abused: No    Sexually Abused: No    FAMILY HISTORY:  Family History  Problem Relation Age of Onset   Heart disease Mother    Gallbladder disease Mother    Heart failure Mother    Diabetes Mother    Breast cancer Sister        30 years ago   Diabetes Sister    Heart attack Father    Diabetes Maternal Grandmother    Colon cancer Neg Hx    Stroke Neg Hx     CURRENT MEDICATIONS:  Outpatient Encounter Medications as of 10/23/2023  Medication Sig   acetaminophen (TYLENOL) 325 MG tablet Take 2 tablets (650 mg total) by mouth every 6 (six) hours as needed for mild pain (or Fever >/= 101).   albuterol (VENTOLIN HFA) 108 (90 Base) MCG/ACT inhaler Inhale 1-2 puffs into the lungs every 6 (six) hours as needed for wheezing or shortness of breath. Wheezing, Asthma Symptoms   aspirin EC 81 MG tablet Take 81 mg by  mouth daily. Swallow whole.   cetirizine (ZYRTEC) 10 MG tablet Take 10 mg by mouth daily.   diazepam (VALIUM) 5 MG tablet Take 5 mg by mouth daily as needed.   ferrous sulfate 325 (65 FE) MG tablet Take 325 mg by mouth. Takes one on Monday, Wednesday and Friday   guaiFENesin (MUCUS RELIEF PO) Take by mouth.   HYDROcodone-acetaminophen (NORCO/VICODIN) 5-325 MG tablet Take one tablet by mouth evyer 8 hours as needed for pain   ipratropium (ATROVENT) 0.06 % nasal spray USE 2 SPRAYS IN EACH NOSTRIL THREE TIMES A DAY   levothyroxine (SYNTHROID) 88 MCG tablet TAKE 1 TABLET DAILY ON MONDAY THROUGH SATURDAY AND ONE-HALF (1/2) TABLET ON SUNDAY   lidocaine (XYLOCAINE) 2 % solution Use as directed 15 mLs in the mouth or throat every 4 (four) hours as needed for mouth pain.   montelukast (SINGULAIR) 10 MG tablet Take 1 tablet (10 mg total) by mouth at bedtime.   OVER THE COUNTER MEDICATION Chest congestion relief and mucus relief dm daily   pantoprazole (PROTONIX) 40 MG tablet Take 1 tablet (40 mg total) by mouth daily before breakfast.   pravastatin (PRAVACHOL) 40 MG tablet TAKE 2 TABLETS DAILY   senna (SENOKOT) 8.6 MG tablet Take 2 tablets by mouth at bedtime.   sucralfate (CARAFATE) 1 GM/10ML suspension Take 10 mLs (1 g total) by mouth 4 (four) times daily -  with meals and at bedtime.   Carbinoxamine Maleate 4 MG TABS Take 1 tablet (4 mg total) by mouth at bedtime.   No facility-administered encounter medications on file as of 10/23/2023.  ALLERGIES:  Allergies  Allergen Reactions   Celecoxib Shortness Of Breath   Dexlansoprazole Anaphylaxis and Other (See Comments)    abd pain   Famotidine Other (See Comments)    Makes her feel "very bad"    LABORATORY DATA:  I have reviewed the labs as listed.  CBC    Component Value Date/Time   WBC 13.1 (H) 10/18/2023 1327   RBC 3.21 (L) 10/18/2023 1327   HGB 9.8 (L) 10/18/2023 1327   HGB 11.2 05/01/2016 1604   HCT 30.3 (L) 10/18/2023 1327   HCT  33.8 (L) 05/01/2016 1604   PLT 215 10/18/2023 1327   PLT 181 05/01/2016 1604   MCV 94.4 10/18/2023 1327   MCV 89 05/01/2016 1604   MCH 30.5 10/18/2023 1327   MCHC 32.3 10/18/2023 1327   RDW 14.3 10/18/2023 1327   RDW 14.2 05/01/2016 1604   LYMPHSABS 1.4 10/18/2023 1327   LYMPHSABS 1.6 05/01/2016 1604   MONOABS 1.3 (H) 10/18/2023 1327   EOSABS 0.1 10/18/2023 1327   EOSABS 0.7 (H) 05/01/2016 1604   BASOSABS 0.0 10/18/2023 1327   BASOSABS 0.0 05/01/2016 1604      Latest Ref Rng & Units 10/18/2023    1:27 PM 10/17/2023   12:51 AM 08/15/2023   12:03 AM  CMP  Glucose 70 - 99 mg/dL 90  130  865   BUN 8 - 23 mg/dL 25  21  20    Creatinine 0.44 - 1.00 mg/dL 7.84  6.96  2.95   Sodium 135 - 145 mmol/L 138  135  136   Potassium 3.5 - 5.1 mmol/L 3.4  3.8  3.5   Chloride 98 - 111 mmol/L 105  102  101   CO2 22 - 32 mmol/L 26  25  26    Calcium 8.9 - 10.3 mg/dL 8.7  9.1  8.9   Total Protein 6.5 - 8.1 g/dL 7.0   6.8   Total Bilirubin 0.3 - 1.2 mg/dL 0.6   0.5   Alkaline Phos 38 - 126 U/L 66   63   AST 15 - 41 U/L 30   16   ALT 0 - 44 U/L 15   11     DIAGNOSTIC IMAGING:  I have independently reviewed the relevant imaging and discussed with the patient.   WRAP UP:  All questions were answered. The patient knows to call the clinic with any problems, questions or concerns.  Medical decision making: Moderate  Time spent on visit: I spent 25 minutes counseling the patient face to face. The total time spent in the appointment was 40 minutes and more than 50% was on counseling.  Carnella Guadalajara, PA-C  10/23/23 2:28 PM

## 2023-10-23 ENCOUNTER — Other Ambulatory Visit: Payer: Self-pay

## 2023-10-23 ENCOUNTER — Inpatient Hospital Stay (HOSPITAL_BASED_OUTPATIENT_CLINIC_OR_DEPARTMENT_OTHER): Payer: Medicare Other | Admitting: Physician Assistant

## 2023-10-23 ENCOUNTER — Encounter: Payer: Self-pay | Admitting: Physician Assistant

## 2023-10-23 VITALS — BP 139/72 | HR 91 | Temp 97.9°F | Resp 16 | Wt 99.9 lb

## 2023-10-23 DIAGNOSIS — D649 Anemia, unspecified: Secondary | ICD-10-CM | POA: Diagnosis not present

## 2023-10-23 DIAGNOSIS — D509 Iron deficiency anemia, unspecified: Secondary | ICD-10-CM

## 2023-10-23 DIAGNOSIS — D472 Monoclonal gammopathy: Secondary | ICD-10-CM

## 2023-10-23 DIAGNOSIS — M1712 Unilateral primary osteoarthritis, left knee: Secondary | ICD-10-CM | POA: Diagnosis not present

## 2023-10-23 DIAGNOSIS — D631 Anemia in chronic kidney disease: Secondary | ICD-10-CM

## 2023-10-23 DIAGNOSIS — N1831 Chronic kidney disease, stage 3a: Secondary | ICD-10-CM

## 2023-10-23 DIAGNOSIS — Z86718 Personal history of other venous thrombosis and embolism: Secondary | ICD-10-CM | POA: Diagnosis not present

## 2023-10-23 DIAGNOSIS — Z87891 Personal history of nicotine dependence: Secondary | ICD-10-CM | POA: Diagnosis not present

## 2023-10-23 NOTE — Patient Instructions (Signed)
[facility] **VISIT SUMMARY & IMPORTANT INSTRUCTIONS **   You were seen today by Rojelio Brenner PA-C for your follow-up visit.    MGUS ("monoclonal gammopathy of undetermined significance") As we discussed, this is a "precancerous" condition where you have slightly increased plasma cells making a slightly increased amount of abnormal immunoglobulin proteins. Although MGUS is not causing any current problems in your body, it has a 1% each year risk of progression to multiple myeloma cancer. We will continue to monitor your labs every 6 months and check whole-body x-rays once a year. Please return sample for 24-hour urine study.   ANEMIA Your anemia may be related to your chronic kidney disease (mild age-related decrease in kidney function). It is also possible that you may have some underlying bone marrow disorder which is causing anemia. Regardless of the cause of your anemia, it is mild and stable at this time.  We will continue monitoring of your anemia, but would consider additional testing or treatment if you had a severe worsening of your anemia (hemoglobin consistently < 9.5).  LABS: Return in 3 months for repeat labs  MEDICATIONS: Continue iron supplement 3 times each week  FOLLOW-UP APPOINTMENT: Office visit in 3 months (1 week after labs and x-rays)  ** Thank you for trusting me with your healthcare!  I strive to provide all of my patients with quality care at each visit.  If you receive a survey for this visit, I would be so grateful to you for taking the time to provide feedback.  Thank you in advance!  ~ Wade Asebedo                   Dr. Doreatha Massed   &   Rojelio Brenner, PA-C   - - - - - - - - - - - - - - - - - -    Thank you for choosing  Cancer Center at [facility] to provide your oncology and hematology care.  To afford each patient quality time with our provider, please arrive at least 15 minutes before  your scheduled appointment time.   If you have a lab appointment with the Cancer Center please come in thru the Main Entrance and check in at the main information desk.  You need to re-schedule your appointment should you arrive 10 or more minutes late.  We strive to give you quality time with our providers, and arriving late affects you and other patients whose appointments are after yours.  Also, if you no show three or more times for appointments you may be dismissed from the clinic at the providers discretion.     Again, thank you for choosing [facility].  Our hope is that these requests will decrease the amount of time that you wait before being seen by our physicians.       _____________________________________________________________  Should you have questions after your visit to [facility], please contact our office at [phone] and follow the prompts.  Our office hours are 8:00 a.m. and 4:30 p.m. Monday - Friday.  Please note that voicemails left after 4:00 p.m. may not be returned until the following business day.  We are closed weekends and major holidays.  You do have access to a nurse 24-7, just call the main number to the clinic [phone] and do not press any options, hold on the line and a nurse will answer the phone.  For prescription refill requests, have your pharmacy contact our office and allow 72 hours.

## 2023-10-25 ENCOUNTER — Other Ambulatory Visit: Payer: Self-pay

## 2023-10-25 DIAGNOSIS — D472 Monoclonal gammopathy: Secondary | ICD-10-CM

## 2023-10-25 DIAGNOSIS — Z87891 Personal history of nicotine dependence: Secondary | ICD-10-CM | POA: Diagnosis not present

## 2023-10-25 DIAGNOSIS — D649 Anemia, unspecified: Secondary | ICD-10-CM | POA: Diagnosis not present

## 2023-10-25 DIAGNOSIS — M1712 Unilateral primary osteoarthritis, left knee: Secondary | ICD-10-CM | POA: Diagnosis not present

## 2023-10-25 DIAGNOSIS — N1831 Chronic kidney disease, stage 3a: Secondary | ICD-10-CM | POA: Diagnosis not present

## 2023-10-25 DIAGNOSIS — Z86718 Personal history of other venous thrombosis and embolism: Secondary | ICD-10-CM | POA: Diagnosis not present

## 2023-10-30 LAB — UPEP/UIFE/LIGHT CHAINS/TP, 24-HR UR
% BETA, Urine: 38.7 %
ALPHA 1 URINE: 2.5 %
Albumin, U: 21.7 %
Alpha 2, Urine: 9.5 %
Free Kappa Lt Chains,Ur: 108.78 mg/L — ABNORMAL HIGH (ref 1.17–86.46)
Free Kappa/Lambda Ratio: 6.73 (ref 1.83–14.26)
Free Lambda Lt Chains,Ur: 16.16 mg/L — ABNORMAL HIGH (ref 0.27–15.21)
GAMMA GLOBULIN URINE: 27.7 %
Total Protein, Urine-Ur/day: 77 mg/(24.h) (ref 30–150)
Total Protein, Urine: 17 mg/dL
Total Volume: 450

## 2023-10-31 ENCOUNTER — Telehealth: Payer: Self-pay | Admitting: *Deleted

## 2023-10-31 ENCOUNTER — Ambulatory Visit: Payer: Medicare Other | Attending: Medical | Admitting: Medical

## 2023-10-31 ENCOUNTER — Other Ambulatory Visit: Payer: Self-pay | Admitting: Gastroenterology

## 2023-10-31 ENCOUNTER — Encounter: Payer: Self-pay | Admitting: Medical

## 2023-10-31 VITALS — BP 120/70 | HR 89 | Ht 61.0 in | Wt 102.2 lb

## 2023-10-31 DIAGNOSIS — E782 Mixed hyperlipidemia: Secondary | ICD-10-CM | POA: Diagnosis not present

## 2023-10-31 DIAGNOSIS — R600 Localized edema: Secondary | ICD-10-CM | POA: Insufficient documentation

## 2023-10-31 DIAGNOSIS — Z8673 Personal history of transient ischemic attack (TIA), and cerebral infarction without residual deficits: Secondary | ICD-10-CM | POA: Diagnosis not present

## 2023-10-31 DIAGNOSIS — K219 Gastro-esophageal reflux disease without esophagitis: Secondary | ICD-10-CM

## 2023-10-31 MED ORDER — ASPIRIN 81 MG PO TBEC: 81.0000 mg | DELAYED_RELEASE_TABLET | Freq: Every day | ORAL | 2 refills | Status: DC

## 2023-10-31 MED ORDER — PRAVASTATIN SODIUM 40 MG PO TABS: 80.0000 mg | ORAL_TABLET | Freq: Every day | ORAL | 2 refills | Status: DC

## 2023-10-31 MED ORDER — PANTOPRAZOLE SODIUM 40 MG PO TBEC
40.0000 mg | DELAYED_RELEASE_TABLET | Freq: Every day | ORAL | 3 refills | Status: DC
Start: 2023-10-31 — End: 2023-11-06

## 2023-10-31 NOTE — Patient Instructions (Signed)
Medication Instructions:  Your physician recommends that you continue on your current medications as directed. Please refer to the Current Medication list given to you today.   Labwork: None today  Testing/Procedures: None today  Follow-Up: 6 months  Any Other Special Instructions Will Be Listed Below (If Applicable).  If you need a refill on your cardiac medications before your next appointment, please call your pharmacy.  

## 2023-10-31 NOTE — Progress Notes (Signed)
Cardiology Office Note:    Date:  11/01/2023   ID:  Christine Leblanc, DOB 09/01/1929, MRN [id number]  PCP:  Benita Stabile, MD  [facility] Cardiologist:  Dina Rich, MD  [facility] Electrophysiologist:  None   Referring MD: Benita Stabile, MD   Chief Complaint: 1 year follow-up  History of Present Illness:    Christine Leblanc is a 89 y.o. female with a hx of chronic DVT in the left profunda femoral vein, stage 3 CKD, GERD, hiatal hernia, near syncope, hypertension, hyperlipidemia, history of TIA who presents for 1 year follow-up.  Patient was evaluated in 2021 for near syncope.  Echo showed preserved EF and trivial MR.  Heart monitor showed predominantly normal sinus rhythm with no significant arrhythmias.  It was felt episodes were due to dehydration.  Patient was diagnosed with DVT November 2023 of the left profunda femoral vein.  Follow-up DVT study showed no DVT and a Baker's cyst.  Patient was last seen in December 2023 and was overall stable from a cardiac perspective.  Today, the patient is overall doing OK. She uses a walker. She lives with her son and daughter. She is eating and drinking OK. No recent illness, fever or chills. She denies chest pain or shortness of breath. She has occasional lower leg edema. It is worse at the end of the day. Plan to refill meds today. She denies palpitations.   Past Medical History:  Diagnosis Date   Anemia    Asthma    Baker's cyst    Cancer (HCC)    skin Ca- ? basal cell    Chronic back pain    Chronic neck pain    Degenerative joint disease    Left shoulder; cervical spine, knees & hands    Gastroesophageal reflux disease    Hiatal hernia; distal esophageal web requiring dilatation; gastric polyps; gastritis; refuses colonoscopy   GERD (gastroesophageal reflux disease)    History of stress test 1990's   stress test done under the care of Dr. Dietrich Pates & Dr. Allyson Sabal, now being followed by Dr. Wyline Mood- in [place] , recently seen  & told to f/U in one yr.    Hyperlipidemia 04/27/2011   Hypertension    Hypothyroidism    Lymphocytic thyroiditis    Mild carotid artery disease (HCC)    Multiple thyroid nodules 2010   Adenomatous; thyroidectomy in 2010   Orthostasis    Osteoarthritis of right knee 07/27/2014   Pneumonia    hosp. for pneumonia- long time ago    Primary localized osteoarthrosis of right shoulder 06/21/2015   Seizures (HCC)    yes- as a child- & into adult years, states she took med. for them at one time, stopped at 32 yrs. of age    Syncope and collapse 2007   Possible CVA in 2012 with left lower extremity weakness; refused hospitalization; CT-Atrophy and chronic microvascular ischemic change.     Past Surgical History:  Procedure Laterality Date   ABDOMINAL HYSTERECTOMY     fibroids   CATARACT EXTRACTION     Bilateral; redo surgery on the right for incomplete primary procedure   COLONOSCOPY  Remote   ESOPHAGOGASTRODUODENOSCOPY  11/2009   Dr. Darrick Penna: probable distal web s/p dilation small hiatal hernia/gastric polyps/mild gastritis, appeared to have narrowing at the junction of D1 and D2 dilated up to 12 mm   ESOPHAGOGASTRODUODENOSCOPY N/A 03/21/2015   Surgeon: West Bali, MD; proximal esophageal web s/p dilation, multiple gastric polyps s/p multiple  biopsies, mild erosive gastritis s/p biopsy.  Pathology with fundic gland polyps, chronic gastritis, negative for H. pylori.   EYE SURGERY     PROLAPSED UTERINE FIBROID LIGATION     outcomed with rectocele & cystocele   SAVORY DILATION N/A 03/21/2015   Procedure: SAVORY DILATION;  Surgeon: West Bali, MD;  [facility];  Service: Endoscopy;  Laterality: N/A;   SHOULDER SURGERY     left   TOTAL KNEE ARTHROPLASTY Right 07/27/2014   Procedure: RIGHT TOTAL KNEE ARTHROPLASTY;  Surgeon: Eulas Post, MD;  Location: [facility] OR;  Service: Orthopedics;  Laterality: Right;   TOTAL SHOULDER ARTHROPLASTY Right 06/21/2015   Procedure: RIGHT  TOTAL SHOULDER ARTHROPLASTY;  Surgeon: Teryl Lucy, MD;  Location: [facility] OR;  Service: Orthopedics;  Laterality: Right;   TOTAL THYROIDECTOMY  2010    Current Medications: Current Meds  Medication Sig   albuterol (VENTOLIN HFA) 108 (90 Base) MCG/ACT inhaler Inhale 1-2 puffs into the lungs every 6 (six) hours as needed for wheezing or shortness of breath. Wheezing, Asthma Symptoms   aspirin EC 81 MG tablet Take 1 tablet (81 mg total) by mouth daily. Swallow whole.   cetirizine (ZYRTEC) 10 MG tablet Take 10 mg by mouth daily.   diazepam (VALIUM) 5 MG tablet Take 5 mg by mouth daily as needed.   ferrous sulfate 325 (65 FE) MG tablet Take 325 mg by mouth. Takes one on Monday, Wednesday and Friday   guaiFENesin (MUCUS RELIEF PO) Take by mouth.   HYDROcodone-acetaminophen (NORCO/VICODIN) 5-325 MG tablet Take one tablet by mouth evyer 8 hours as needed for pain   ipratropium (ATROVENT) 0.06 % nasal spray USE 2 SPRAYS IN EACH NOSTRIL THREE TIMES A DAY   levothyroxine (SYNTHROID) 88 MCG tablet TAKE 1 TABLET DAILY ON MONDAY THROUGH SATURDAY AND ONE-HALF (1/2) TABLET ON SUNDAY   lidocaine (XYLOCAINE) 2 % solution Use as directed 15 mLs in the mouth or throat every 4 (four) hours as needed for mouth pain.   montelukast (SINGULAIR) 10 MG tablet Take 1 tablet (10 mg total) by mouth at bedtime.   OVER THE COUNTER MEDICATION Chest congestion relief and mucus relief dm daily   pantoprazole (PROTONIX) 40 MG tablet Take 1 tablet (40 mg total) by mouth daily before breakfast.   senna (SENOKOT) 8.6 MG tablet Take 2 tablets by mouth at bedtime.   sucralfate (CARAFATE) 1 GM/10ML suspension Take 10 mLs (1 g total) by mouth 4 (four) times daily -  with meals and at bedtime.   [DISCONTINUED] aspirin EC 81 MG tablet Take 81 mg by mouth daily. Swallow whole.   [DISCONTINUED] pravastatin (PRAVACHOL) 40 MG tablet TAKE 2 TABLETS DAILY     Allergies:   Celecoxib, Dexlansoprazole, and Famotidine   Social History    Socioeconomic History   Marital status: Widowed    Spouse name: Not on file   Number of children: 4   Years of education: 51   Highest education level: Not on file  Occupational History   Occupation: Artist    Comment: does not yield regular income   Occupation: Retired    Comment: Engineer, civil (consulting)  Tobacco Use   Smoking status: Former    Current packs/day: 0.00    Average packs/day: 0.8 packs/day for 20.0 years (16.0 ttl pk-yrs)    Types: Cigarettes    Start date: 12/31/1958    Quit date: 12/31/1978    Years since quitting: 44.8   Smokeless tobacco: Never  Vaping Use   Vaping  status: Never Used  Substance and Sexual Activity   Alcohol use: Not Currently    Comment: rarely   Drug use: No   Sexual activity: Never    Birth control/protection: Surgical    Comment: widowed since 2010  Other Topics Concern   Not on file  Social History Narrative   ** Merged History Encounter **       Lives at home. Her daughter lives with her.  Caffeine use: 1 cup coffee per day    Social Determinants of Health   Financial Resource Strain: Not on file  Food Insecurity: No Food Insecurity (08/15/2023)   Hunger Vital Sign    Worried About Running Out of Food in the Last Year: Never true    Ran Out of Food in the Last Year: Never true  Transportation Needs: No Transportation Needs (08/15/2023)   PRAPARE - Administrator, Civil Service (Medical): No    Lack of Transportation (Non-Medical): No  Physical Activity: Not on file  Stress: Not on file  Social Connections: Not on file     Family History: The patient's family history includes Breast cancer in her sister; Diabetes in her maternal grandmother, mother, and sister; Gallbladder disease in her mother; Heart attack in her father; Heart disease in her mother; Heart failure in her mother. There is no history of Colon cancer or Stroke.  ROS:   Please see the history of present illness.     All other systems reviewed and are  negative.  EKGs/Labs/Other Studies Reviewed:    The following studies were reviewed today:  Echo 2021 1. Left ventricular ejection fraction, by estimation, is 70 to 75%. The  left ventricle has hyperdynamic function. The left ventricle has no  regional wall motion abnormalities. There is mild left ventricular  hypertrophy. Left ventricular diastolic  parameters are indeterminate.   2. Right ventricular systolic function is normal. The right ventricular  size is normal. There is normal pulmonary artery systolic pressure. The  estimated right ventricular systolic pressure is 20.0 mmHg.   3. The mitral valve is grossly normal. Trivial mitral valve  regurgitation.   4. The aortic valve is tricuspid. Aortic valve regurgitation is not  visualized. Mild aortic valve sclerosis is present, with no evidence of  aortic valve stenosis.   5. The inferior vena cava is normal in size with greater than 50%  respiratory variability, suggesting right atrial pressure of 3 mmHg.   EKG:  EKG is not ordered today.    Recent Labs: 02/15/2023: TSH 3.897 10/18/2023: ALT 15; BUN 25; Creatinine, Ser 1.02; Hemoglobin 9.8; Platelets 215; Potassium 3.4; Sodium 138  Recent Lipid Panel    Component Value Date/Time   CHOL 150 12/11/2019 0000   TRIG 66 12/11/2019 0000   HDL 60 12/11/2019 0000   CHOLHDL 2.5 09/20/2016 0951   VLDL 10 09/20/2016 0951   LDLCALC 77 12/11/2019 0000     Physical Exam:    VS:  BP 120/70   Pulse 89   Ht 5\' 1"  (1.549 m)   Wt 102 lb 3.2 oz (46.4 kg)   SpO2 93%   BMI 19.31 kg/m     Wt Readings from Last 3 Encounters:  10/31/23 102 lb 3.2 oz (46.4 kg)  10/23/23 99 lb 13.9 oz (45.3 kg)  10/16/23 100 lb (45.4 kg)     GEN:  Well nourished, well developed in no acute distress HEENT: Normal NECK: No JVD; No carotid bruits LYMPHATICS: No lymphadenopathy CARDIAC: RRR, no  murmurs, rubs, gallops RESPIRATORY:  Clear to auscultation without rales, wheezing or rhonchi  ABDOMEN:  Soft, non-tender, non-distended MUSCULOSKELETAL:  1+ pedal edema; No deformity  SKIN: Warm and dry NEUROLOGIC:  Alert and oriented x 3 PSYCHIATRIC:  Normal affect   ASSESSMENT:    1. Pedal edema   2. H/O TIA (transient ischemic attack) and stroke   3. Hyperlipidemia, mixed    PLAN:    In order of problems listed above:  Lower leg edema Patient has 1+ pedal edema. Patient reports swelling resolves overnight, likely dependent edema. Prior echo showed normal LVEF and indeterminate diastolic function. I recommended low salt diet, leg elevation and compression socks.   H/O TIA HLD LDL 73. I will refill pravastatin 80mg  daily.   Disposition: Follow up in 6 month(s) with MD/APP    Signed, Christepher Melchior David Stall, PA-C  11/01/2023 9:11 AM    [facility]

## 2023-10-31 NOTE — Telephone Encounter (Signed)
Pt needs refill on pantoprazole sent to [facility].

## 2023-10-31 NOTE — Telephone Encounter (Signed)
Rx sent 

## 2023-11-01 DIAGNOSIS — D649 Anemia, unspecified: Secondary | ICD-10-CM | POA: Diagnosis not present

## 2023-11-01 DIAGNOSIS — R053 Chronic cough: Secondary | ICD-10-CM | POA: Diagnosis not present

## 2023-11-01 DIAGNOSIS — R1310 Dysphagia, unspecified: Secondary | ICD-10-CM | POA: Diagnosis not present

## 2023-11-01 DIAGNOSIS — K219 Gastro-esophageal reflux disease without esophagitis: Secondary | ICD-10-CM | POA: Diagnosis not present

## 2023-11-01 DIAGNOSIS — Z7951 Long term (current) use of inhaled steroids: Secondary | ICD-10-CM | POA: Diagnosis not present

## 2023-11-01 DIAGNOSIS — Z79899 Other long term (current) drug therapy: Secondary | ICD-10-CM | POA: Diagnosis not present

## 2023-11-05 DIAGNOSIS — H1789 Other corneal scars and opacities: Secondary | ICD-10-CM | POA: Diagnosis not present

## 2023-11-05 DIAGNOSIS — H21233 Degeneration of iris (pigmentary), bilateral: Secondary | ICD-10-CM | POA: Diagnosis not present

## 2023-11-05 DIAGNOSIS — H52203 Unspecified astigmatism, bilateral: Secondary | ICD-10-CM | POA: Diagnosis not present

## 2023-11-05 DIAGNOSIS — Z961 Presence of intraocular lens: Secondary | ICD-10-CM | POA: Diagnosis not present

## 2023-11-06 ENCOUNTER — Telehealth: Payer: Self-pay | Admitting: Gastroenterology

## 2023-11-06 ENCOUNTER — Other Ambulatory Visit: Payer: Self-pay | Admitting: Allergy & Immunology

## 2023-11-06 ENCOUNTER — Other Ambulatory Visit: Payer: Self-pay | Admitting: Gastroenterology

## 2023-11-06 ENCOUNTER — Other Ambulatory Visit: Payer: Self-pay | Admitting: *Deleted

## 2023-11-06 DIAGNOSIS — K219 Gastro-esophageal reflux disease without esophagitis: Secondary | ICD-10-CM

## 2023-11-06 MED ORDER — PRAVASTATIN SODIUM 40 MG PO TABS: 80.0000 mg | ORAL_TABLET | Freq: Every day | ORAL | 2 refills | Status: DC

## 2023-11-06 MED ORDER — PANTOPRAZOLE SODIUM 40 MG PO TBEC: 40.0000 mg | DELAYED_RELEASE_TABLET | Freq: Two times a day (BID) | ORAL | 3 refills | Status: DC

## 2023-11-06 NOTE — Telephone Encounter (Signed)
Rx sent 

## 2023-11-06 NOTE — Telephone Encounter (Signed)
Patient's son, [name], came into the office to say that we just refilled a medication for her and it should have been a 90 day supply and she was given 30.  [employer] pays for 90 day supply.  When I asked him which medication it was he said "ya'll should know which it is".  He asked if we could give her the 90 day supply.

## 2023-11-06 NOTE — Telephone Encounter (Signed)
Send a 90 day supply of pantoprazole 40mg  to [facility].

## 2023-11-14 ENCOUNTER — Encounter: Payer: Self-pay | Admitting: Gastroenterology

## 2023-11-19 ENCOUNTER — Telehealth: Payer: Self-pay | Admitting: Adult Health

## 2023-11-19 NOTE — Telephone Encounter (Signed)
Patient got mediation from pharmacy but she is unsure if she should take it; monolukis 35mg   Call back number [phone] Pharmacy [facility]

## 2023-11-26 ENCOUNTER — Ambulatory Visit: Payer: Medicare Other | Admitting: Orthopaedic Surgery

## 2023-11-26 ENCOUNTER — Other Ambulatory Visit (INDEPENDENT_AMBULATORY_CARE_PROVIDER_SITE_OTHER): Payer: Self-pay

## 2023-11-26 DIAGNOSIS — M25561 Pain in right knee: Secondary | ICD-10-CM

## 2023-11-26 DIAGNOSIS — M25562 Pain in left knee: Secondary | ICD-10-CM

## 2023-11-26 DIAGNOSIS — G8929 Other chronic pain: Secondary | ICD-10-CM | POA: Diagnosis not present
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
## 2023-11-26 NOTE — Telephone Encounter (Signed)
Unable to speak with patient's son, [name], as he is not listed on DPR for our office.  [name] will have patient contact our office once she wakes up.

## 2023-11-26 NOTE — Telephone Encounter (Signed)
I called the number provided and there was no answer- no option to leave msg  Called [facility] [address] [phone]- they are closed for lunch 1:31 PM Will call back   I am unsure why we are just hearing if there is an issue with the montelukast since we prescribed this over 1 month ago

## 2023-11-26 NOTE — Telephone Encounter (Signed)
Patient returning call. States montelukast has a flag that she should check with her doctor. Patient is very confused. Please call back at [phone]. Patient gave verbal to leave voicemail on [phone].

## 2023-11-26 NOTE — Telephone Encounter (Signed)
Per [facility] patient picked up Montelukast 10/03/23 #90. They do not show any "flags" on her rx. I inquired about the bottle and any warning labels on it, the pharmacy advised it does print with a warning label "Check with your provider before taking any additional medications".   ATC patient x2 at number provided. Phone rings, beeps, and then disconnects. Unable to leave a message.

## 2023-12-02 ENCOUNTER — Other Ambulatory Visit: Payer: Self-pay

## 2023-12-02 ENCOUNTER — Encounter: Payer: Self-pay | Admitting: Internal Medicine

## 2023-12-02 ENCOUNTER — Ambulatory Visit (INDEPENDENT_AMBULATORY_CARE_PROVIDER_SITE_OTHER): Payer: Medicare Other | Admitting: Internal Medicine

## 2023-12-02 VITALS — BP 138/82 | HR 98 | Temp 97.5°F | Ht 61.0 in | Wt 102.0 lb

## 2023-12-02 DIAGNOSIS — J310 Chronic rhinitis: Secondary | ICD-10-CM

## 2023-12-02 DIAGNOSIS — K219 Gastro-esophageal reflux disease without esophagitis: Secondary | ICD-10-CM

## 2023-12-02 DIAGNOSIS — J4530 Mild persistent asthma, uncomplicated: Secondary | ICD-10-CM

## 2023-12-02 MED ORDER — ALBUTEROL SULFATE HFA 108 (90 BASE) MCG/ACT IN AERS: 1.0000 | INHALATION_SPRAY | Freq: Four times a day (QID) | RESPIRATORY_TRACT | 0 refills | Status: DC | PRN

## 2023-12-02 MED ORDER — CETIRIZINE HCL 10 MG PO TABS: 10.0000 mg | ORAL_TABLET | Freq: Every day | ORAL | 5 refills | Status: DC

## 2023-12-02 MED ORDER — IPRATROPIUM BROMIDE 0.06 % NA SOLN: 1.0000 | Freq: Four times a day (QID) | NASAL | 5 refills | Status: DC | PRN

## 2023-12-02 MED ORDER — FLUTICASONE PROPIONATE 50 MCG/ACT NA SUSP: 2.0000 | Freq: Every day | NASAL | 5 refills | Status: DC

## 2023-12-02 NOTE — Patient Instructions (Addendum)
Chronic rhinitis - SPT 02/2021: negative to aeroallergens - Continue Zyrtec 10mg  daily.  - Start Flonase 2 sprays each nostril daily.  Aim upward and outward.   - Continue Atrovent 1-2 sprays each nostril up to three-four times a day as needed for runny nose, drainage.  Aim upward and outward.    Mild persistent asthma - Rescue medications: Albuterol 1-2 puffs every 4-6 hours as needed for shortness of breath or wheezing.  - Asthma control goals:  * Full participation in all desired activities (may need albuterol before activity) * Albuterol use two time or less a week on average (not counting use with activity) * Cough interfering with sleep two time or less a month * Oral steroids no more than once a year * No hospitalizations  Dysphagia, GERD - Continue follow up with GI.

## 2023-12-02 NOTE — Progress Notes (Addendum)
FOLLOW UP Date of Service/Encounter:  12/02/23   Subjective:  Christine Leblanc (DOB: 05-Apr-1929) is a 89 y.o. female who [facility] for follow up for chronic rhinitis, chronic cough/asthma and GERD.    History obtained from: chart review and patient and son.  Not the best historian.  At last visit with Dr. Dellis Anes, she was doing well so they discussed use of PRN albuterol. Also on Xyzal and Atrovent nose spray.    Since last visit, reports having trouble with post nasal drip, runny nose and coughing.  Worse when she eats.  She also has reflux and dysphagia, seeing GI.  Not sure when she last saw them or what medications she is taking.  Does note the fan helps with the cough. Was taking allergy shots for a long time in the past so she is surprised Dr Dellis Anes told her she does not have allergies. She used Flonase in the past but not recently.  Now using Ipratroprium PRN and Xyzal daily.  Breathing is fine, denies much issues with dyspnea.  Rarely needs her albuterol, not sure the last time she took it.  No ER visits/oral prednisone use.   Past Medical History: Past Medical History:  Diagnosis Date   Anemia    Asthma    Baker's cyst    Cancer (HCC)    skin Ca- ? basal cell    Chronic back pain    Chronic neck pain    Degenerative joint disease    Left shoulder; cervical spine, knees & hands    Gastroesophageal reflux disease    Hiatal hernia; distal esophageal web requiring dilatation; gastric polyps; gastritis; refuses colonoscopy   GERD (gastroesophageal reflux disease)    History of stress test 1990's   stress test done under the care of Dr. Dietrich Pates & Dr. Allyson Sabal, now being followed by Dr. Wyline Mood- in [place] , recently seen & told to f/U in one yr.    Hyperlipidemia 04/27/2011   Hypertension    Hypothyroidism    Lymphocytic thyroiditis    Mild carotid artery disease (HCC)    Multiple thyroid nodules 2010   Adenomatous;  thyroidectomy in 2010   Orthostasis    Osteoarthritis of right knee 07/27/2014   Pneumonia    hosp. for pneumonia- long time ago    Primary localized osteoarthrosis of right shoulder 06/21/2015   Seizures (HCC)    yes- as a child- & into adult years, states she took med. for them at one time, stopped at 32 yrs. of age    Syncope and collapse 2007   Possible CVA in 2012 with left lower extremity weakness; refused hospitalization; CT-Atrophy and chronic microvascular ischemic change.     Objective:  BP 138/82 (BP Location: Right Arm, Patient Position: Sitting, Cuff Size: Small)   Pulse 98   Temp (!) 97.5 F (36.4 C)   Ht 5\' 1"  (1.549 m)   Wt 102 lb (46.3 kg)   SpO2 95%   BMI 19.27 kg/m  Body mass index is 19.27 kg/m. Physical Exam: GEN: alert, well developed HEENT: clear conjunctiva, nose with mild inferior turbinate hypertrophy, pink nasal mucosa, clear rhinorrhea, + cobblestoning HEART: regular rate and rhythm, no murmur LUNGS: clear to auscultation bilaterally, no coughing, unlabored respiration SKIN: no rashes or lesions  Spirometry:  Tracings reviewed. Her effort: Good reproducible efforts. FVC: 1.43L, 72% predicted  FEV1: 0.99L, 66% predicted FEV1/FVC ratio: 69% Interpretation: Spirometry consistent with normal pattern.  Please see  scanned spirometry results for details.  Assessment:   1. Chronic rhinitis   2. Mild persistent asthma without complication   3. Gastroesophageal reflux disease without esophagitis     Plan/Recommendations:  Chronic rhinitis - Discussed cough could be related to reflux and post nasal drip.  Prior skin test was negative, will obtain blood testing for aeroallergens.  Also discussed starting Flonase.  - SPT 02/2021: negative to aeroallergens - Continue Zyrtec 10mg  daily.  - Start Flonase 2 sprays each nostril daily.  Aim upward and outward.   - Continue Atrovent 1-2 sprays each nostril up to three-four times a day as needed for runny  nose, drainage.  Aim upward and outward.    Mild persistent asthma - Spirometry today without obstruction. Cough is likely related to upper airway/reflux.   - Rescue medications: Albuterol 1-2 puffs every 4-6 hours as needed for shortness of breath or wheezing.  - Asthma control goals:  * Full participation in all desired activities (may need albuterol before activity) * Albuterol use two time or less a week on average (not counting use with activity) * Cough interfering with sleep two time or less a month * Oral steroids no more than once a year * No hospitalizations  Dysphagia, GERD - Continue follow up with GI as when it is uncontrolled, it can lead to chronic cough.              Return in about 6 months (around 06/01/2024).  Alesia Morin, MD [facility]

## 2023-12-04 LAB — ALLERGENS W/TOTAL IGE AREA 2
Alternaria Alternata IgE: <0.1 kU/L
Aspergillus Fumigatus IgE: <0.1 kU/L
Bermuda Grass IgE: <0.1 kU/L
Cat Dander IgE: 0.19 kU/L — AB
Cedar, Mountain IgE: <0.1 kU/L
Cladosporium Herbarum IgE: <0.1 kU/L
Cockroach, German IgE: <0.1 kU/L
Common Silver Birch IgE: <0.1 kU/L
Cottonwood IgE: <0.1 kU/L
D Farinae IgE: <0.1 kU/L
D Pteronyssinus IgE: <0.1 kU/L
Dog Dander IgE: <0.1 kU/L
Elm, American IgE: <0.1 kU/L
IgE (Immunoglobulin E), Serum: 143 [IU]/mL (ref 6–495)
Johnson Grass IgE: <0.1 kU/L
Maple/Box Elder IgE: <0.1 kU/L
Mouse Urine IgE: <0.1 kU/L
Oak, White IgE: <0.1 kU/L
Pecan, Hickory IgE: <0.1 kU/L
Penicillium Chrysogen IgE: <0.1 kU/L
Pigweed, Rough IgE: <0.1 kU/L
Ragweed, Short IgE: <0.1 kU/L
Sheep Sorrel IgE Qn: <0.1 kU/L
Timothy Grass IgE: <0.1 kU/L
White Mulberry IgE: <0.1 kU/L

## 2023-12-04 NOTE — Telephone Encounter (Signed)
Patient states that this medication Montelukast states on the bottle that it is not a medicine that can be started and stopped and it is not to be taken with other medications. She states that she cannot take this medication. She seems like she is confused. Her son in the back ground is confused as well. She states that she has not started it and will not. She will bring bottle to next appointment so it can be discussed with the provider.

## 2023-12-04 NOTE — Addendum Note (Signed)
Addended by: Elsworth Soho on: 12/04/2023 05:41 PM   Modules accepted: Orders

## 2023-12-05 DIAGNOSIS — R4182 Altered mental status, unspecified: Secondary | ICD-10-CM | POA: Diagnosis not present

## 2023-12-09 ENCOUNTER — Emergency Department (HOSPITAL_COMMUNITY): Payer: Medicare Other

## 2023-12-09 ENCOUNTER — Other Ambulatory Visit: Payer: Self-pay

## 2023-12-09 ENCOUNTER — Inpatient Hospital Stay (HOSPITAL_COMMUNITY)
Admission: EM | Admit: 2023-12-09 | Discharge: 2023-12-11 | DRG: 194 | Disposition: A | Discharge status: Home Health | Payer: Medicare Other | Attending: Family Medicine | Admitting: Family Medicine

## 2023-12-09 DIAGNOSIS — R1310 Dysphagia, unspecified: Secondary | ICD-10-CM | POA: Diagnosis not present

## 2023-12-09 DIAGNOSIS — G4489 Other headache syndrome: Secondary | ICD-10-CM | POA: Diagnosis not present

## 2023-12-09 DIAGNOSIS — Z888 Allergy status to other drugs, medicaments and biological substances status: Secondary | ICD-10-CM

## 2023-12-09 DIAGNOSIS — Z7982 Long term (current) use of aspirin: Secondary | ICD-10-CM

## 2023-12-09 DIAGNOSIS — J449 Chronic obstructive pulmonary disease, unspecified: Secondary | ICD-10-CM | POA: Diagnosis present

## 2023-12-09 DIAGNOSIS — Z79899 Other long term (current) drug therapy: Secondary | ICD-10-CM | POA: Diagnosis not present

## 2023-12-09 DIAGNOSIS — Z8249 Family history of ischemic heart disease and other diseases of the circulatory system: Secondary | ICD-10-CM | POA: Diagnosis not present

## 2023-12-09 DIAGNOSIS — R4182 Altered mental status, unspecified: Secondary | ICD-10-CM

## 2023-12-09 DIAGNOSIS — Z803 Family history of malignant neoplasm of breast: Secondary | ICD-10-CM | POA: Diagnosis not present

## 2023-12-09 DIAGNOSIS — K219 Gastro-esophageal reflux disease without esophagitis: Secondary | ICD-10-CM | POA: Diagnosis present

## 2023-12-09 DIAGNOSIS — E785 Hyperlipidemia, unspecified: Secondary | ICD-10-CM | POA: Diagnosis present

## 2023-12-09 DIAGNOSIS — R64 Cachexia: Secondary | ICD-10-CM | POA: Diagnosis present

## 2023-12-09 DIAGNOSIS — Z96651 Presence of right artificial knee joint: Secondary | ICD-10-CM | POA: Diagnosis present

## 2023-12-09 DIAGNOSIS — R531 Weakness: Secondary | ICD-10-CM

## 2023-12-09 DIAGNOSIS — Z90710 Acquired absence of both cervix and uterus: Secondary | ICD-10-CM

## 2023-12-09 DIAGNOSIS — Z87891 Personal history of nicotine dependence: Secondary | ICD-10-CM | POA: Diagnosis not present

## 2023-12-09 DIAGNOSIS — I10 Essential (primary) hypertension: Secondary | ICD-10-CM | POA: Diagnosis present

## 2023-12-09 DIAGNOSIS — R0689 Other abnormalities of breathing: Secondary | ICD-10-CM | POA: Diagnosis not present

## 2023-12-09 DIAGNOSIS — Z833 Family history of diabetes mellitus: Secondary | ICD-10-CM

## 2023-12-09 DIAGNOSIS — J189 Pneumonia, unspecified organism: Principal | ICD-10-CM | POA: Diagnosis present

## 2023-12-09 DIAGNOSIS — G8929 Other chronic pain: Secondary | ICD-10-CM | POA: Diagnosis present

## 2023-12-09 DIAGNOSIS — R059 Cough, unspecified: Secondary | ICD-10-CM | POA: Diagnosis not present

## 2023-12-09 DIAGNOSIS — J439 Emphysema, unspecified: Secondary | ICD-10-CM | POA: Diagnosis not present

## 2023-12-09 DIAGNOSIS — R6510 Systemic inflammatory response syndrome (SIRS) of non-infectious origin without acute organ dysfunction: Secondary | ICD-10-CM | POA: Diagnosis not present

## 2023-12-09 DIAGNOSIS — Z681 Body mass index (BMI) 19 or less, adult: Secondary | ICD-10-CM

## 2023-12-09 DIAGNOSIS — D509 Iron deficiency anemia, unspecified: Secondary | ICD-10-CM | POA: Diagnosis not present

## 2023-12-09 DIAGNOSIS — Z96611 Presence of right artificial shoulder joint: Secondary | ICD-10-CM | POA: Diagnosis present

## 2023-12-09 DIAGNOSIS — R519 Headache, unspecified: Secondary | ICD-10-CM | POA: Diagnosis not present

## 2023-12-09 DIAGNOSIS — Z79890 Hormone replacement therapy (postmenopausal): Secondary | ICD-10-CM

## 2023-12-09 DIAGNOSIS — M549 Dorsalgia, unspecified: Secondary | ICD-10-CM | POA: Diagnosis present

## 2023-12-09 DIAGNOSIS — D649 Anemia, unspecified: Secondary | ICD-10-CM | POA: Diagnosis present

## 2023-12-09 DIAGNOSIS — R509 Fever, unspecified: Secondary | ICD-10-CM | POA: Diagnosis not present

## 2023-12-09 DIAGNOSIS — E039 Hypothyroidism, unspecified: Secondary | ICD-10-CM | POA: Diagnosis present

## 2023-12-09 DIAGNOSIS — R918 Other nonspecific abnormal finding of lung field: Secondary | ICD-10-CM | POA: Diagnosis not present

## 2023-12-09 DIAGNOSIS — Z8673 Personal history of transient ischemic attack (TIA), and cerebral infarction without residual deficits: Secondary | ICD-10-CM

## 2023-12-09 DIAGNOSIS — Z96612 Presence of left artificial shoulder joint: Secondary | ICD-10-CM | POA: Diagnosis not present

## 2023-12-09 DIAGNOSIS — M542 Cervicalgia: Secondary | ICD-10-CM | POA: Diagnosis present

## 2023-12-09 DIAGNOSIS — J440 Chronic obstructive pulmonary disease with acute lower respiratory infection: Secondary | ICD-10-CM | POA: Diagnosis present

## 2023-12-09 DIAGNOSIS — E890 Postprocedural hypothyroidism: Secondary | ICD-10-CM | POA: Diagnosis present

## 2023-12-09 LAB — CBC WITH DIFFERENTIAL/PLATELET
Abs Immature Granulocytes: 0.03 10*3/uL (ref 0.00–0.07)
Basophils Absolute: 0.1 10*3/uL (ref 0.0–0.1)
Basophils Relative: 0 %
Eosinophils Absolute: 0.3 10*3/uL (ref 0.0–0.5)
Eosinophils Relative: 2 %
HCT: 31.9 % — ABNORMAL LOW (ref 36.0–46.0)
Hemoglobin: 10.2 g/dL — ABNORMAL LOW (ref 12.0–15.0)
Immature Granulocytes: 0 %
Lymphocytes Relative: 5 %
Lymphs Abs: 0.7 10*3/uL (ref 0.7–4.0)
MCH: 29.6 pg (ref 26.0–34.0)
MCHC: 32 g/dL (ref 30.0–36.0)
MCV: 92.5 fL (ref 80.0–100.0)
Monocytes Absolute: 1.1 10*3/uL — ABNORMAL HIGH (ref 0.1–1.0)
Monocytes Relative: 9 %
Neutro Abs: 10.8 10*3/uL — ABNORMAL HIGH (ref 1.7–7.7)
Neutrophils Relative %: 84 %
Platelets: 168 10*3/uL (ref 150–400)
RBC: 3.45 MIL/uL — ABNORMAL LOW (ref 3.87–5.11)
RDW: 14 % (ref 11.5–15.5)
WBC: 13 10*3/uL — ABNORMAL HIGH (ref 4.0–10.5)
nRBC: 0 % (ref 0.0–0.2)

## 2023-12-09 LAB — COMPREHENSIVE METABOLIC PANEL
ALT: 13 U/L (ref 0–44)
AST: 21 U/L (ref 15–41)
Albumin: 3.5 g/dL (ref 3.5–5.0)
Alkaline Phosphatase: 62 U/L (ref 38–126)
Anion gap: 9 (ref 5–15)
BUN: 25 mg/dL — ABNORMAL HIGH (ref 8–23)
CO2: 27 mmol/L (ref 22–32)
Calcium: 9.4 mg/dL (ref 8.9–10.3)
Chloride: 105 mmol/L (ref 98–111)
Creatinine, Ser: 0.91 mg/dL (ref 0.44–1.00)
GFR, Estimated: 59 mL/min — ABNORMAL LOW (ref >60)
Glucose, Bld: 116 mg/dL — ABNORMAL HIGH (ref 70–99)
Potassium: 3.6 mmol/L (ref 3.5–5.1)
Sodium: 141 mmol/L (ref 135–145)
Total Bilirubin: 0.5 mg/dL (ref <1.2)
Total Protein: 7.4 g/dL (ref 6.5–8.1)

## 2023-12-09 LAB — RESP PANEL BY RT-PCR (RSV, FLU A&B, COVID)  RVPGX2
Influenza A by PCR: NEGATIVE
Influenza B by PCR: NEGATIVE
Resp Syncytial Virus by PCR: NEGATIVE
SARS Coronavirus 2 by RT PCR: NEGATIVE

## 2023-12-09 LAB — URINALYSIS, ROUTINE W REFLEX MICROSCOPIC
Bilirubin Urine: NEGATIVE
Glucose, UA: NEGATIVE mg/dL
Ketones, ur: NEGATIVE mg/dL
Nitrite: NEGATIVE
Protein, ur: 30 mg/dL — AB
Specific Gravity, Urine: 1.015 (ref 1.005–1.030)
pH: 7 (ref 5.0–8.0)

## 2023-12-09 LAB — LACTIC ACID, PLASMA: Lactic Acid, Venous: 1.7 mmol/L (ref 0.5–1.9)

## 2023-12-09 LAB — MAGNESIUM: Magnesium: 1.8 mg/dL (ref 1.7–2.4)

## 2023-12-09 LAB — PHOSPHORUS: Phosphorus: 3.4 mg/dL (ref 2.5–4.6)

## 2023-12-09 LAB — PROCALCITONIN: Procalcitonin: <0.1 ng/mL

## 2023-12-09 MED ORDER — SENNOSIDES-DOCUSATE SODIUM 8.6-50 MG PO TABS: 1.0000 | ORAL_TABLET | Freq: Every evening | ORAL | Status: DC | PRN

## 2023-12-09 MED ORDER — ACETAMINOPHEN 325 MG PO TABS
650.0000 mg | ORAL_TABLET | Freq: Once | ORAL | Status: AC
  Administered 2023-12-09: 650 mg via ORAL
  Filled 2023-12-09: qty 2

## 2023-12-09 MED ORDER — MONTELUKAST SODIUM 10 MG PO TABS
10.0000 mg | ORAL_TABLET | Freq: Every day | ORAL | Status: DC
Start: 2023-12-09 — End: 2023-12-11
  Administered 2023-12-09 – 2023-12-10 (×2): 10 mg via ORAL
  Filled 2023-12-09 (×2): qty 1

## 2023-12-09 MED ORDER — SENNA 8.6 MG PO TABS
2.0000 | ORAL_TABLET | Freq: Every day | ORAL | Status: DC
  Administered 2023-12-09 – 2023-12-10 (×2): 17.2 mg via ORAL
  Filled 2023-12-09 (×2): qty 2

## 2023-12-09 MED ORDER — BISACODYL 5 MG PO TBEC: 5.0000 mg | DELAYED_RELEASE_TABLET | Freq: Every day | ORAL | Status: DC | PRN

## 2023-12-09 MED ORDER — OXYCODONE HCL 5 MG PO TABS
5.0000 mg | ORAL_TABLET | ORAL | Status: DC | PRN
Start: 2023-12-09 — End: 2023-12-11

## 2023-12-09 MED ORDER — HYDROMORPHONE HCL 1 MG/ML IJ SOLN: 0.5000 mg | INTRAMUSCULAR | Status: DC | PRN

## 2023-12-09 MED ORDER — LACTATED RINGERS IV BOLUS (SEPSIS)
1000.0000 mL | Freq: Once | INTRAVENOUS | Status: AC
  Administered 2023-12-09: 1000 mL via INTRAVENOUS

## 2023-12-09 MED ORDER — PRAVASTATIN SODIUM 40 MG PO TABS
80.0000 mg | ORAL_TABLET | Freq: Every day | ORAL | Status: DC
  Administered 2023-12-10 – 2023-12-11 (×2): 80 mg via ORAL
  Filled 2023-12-09 (×2): qty 2

## 2023-12-09 MED ORDER — SODIUM CHLORIDE 0.9 % IV SOLN
2.0000 g | INTRAVENOUS | Status: DC
  Administered 2023-12-10 – 2023-12-11 (×2): 2 g via INTRAVENOUS
  Filled 2023-12-09 (×2): qty 20

## 2023-12-09 MED ORDER — DIAZEPAM 2 MG PO TABS
2.0000 mg | ORAL_TABLET | Freq: Three times a day (TID) | ORAL | Status: DC | PRN
  Administered 2023-12-10: 2 mg via ORAL
  Filled 2023-12-09: qty 1

## 2023-12-09 MED ORDER — HYDRALAZINE HCL 20 MG/ML IJ SOLN: 10.0000 mg | INTRAMUSCULAR | Status: DC | PRN

## 2023-12-09 MED ORDER — ONDANSETRON HCL 4 MG PO TABS: 4.0000 mg | ORAL_TABLET | Freq: Four times a day (QID) | ORAL | Status: DC | PRN

## 2023-12-09 MED ORDER — GLYCOPYRROLATE 1 MG PO TABS
1.0000 mg | ORAL_TABLET | Freq: Two times a day (BID) | ORAL | Status: DC
  Administered 2023-12-09 – 2023-12-11 (×4): 1 mg via ORAL
  Filled 2023-12-09 (×4): qty 1

## 2023-12-09 MED ORDER — SODIUM CHLORIDE 0.9 % IV SOLN: 250.0000 mL | INTRAVENOUS | Status: DC | PRN

## 2023-12-09 MED ORDER — LACTATED RINGERS IV SOLN: INTRAVENOUS | Status: DC

## 2023-12-09 MED ORDER — IPRATROPIUM BROMIDE 0.02 % IN SOLN: 0.5000 mg | Freq: Four times a day (QID) | RESPIRATORY_TRACT | Status: DC

## 2023-12-09 MED ORDER — LEVOTHYROXINE SODIUM 88 MCG PO TABS
88.0000 ug | ORAL_TABLET | Freq: Every day | ORAL | Status: DC
  Administered 2023-12-10 – 2023-12-11 (×2): 88 ug via ORAL
  Filled 2023-12-09 (×2): qty 1

## 2023-12-09 MED ORDER — LACTATED RINGERS IV BOLUS (SEPSIS)
500.0000 mL | Freq: Once | INTRAVENOUS | Status: AC
  Administered 2023-12-09: 500 mL via INTRAVENOUS

## 2023-12-09 MED ORDER — LEVALBUTEROL HCL 0.63 MG/3ML IN NEBU: 0.6300 mg | INHALATION_SOLUTION | Freq: Three times a day (TID) | RESPIRATORY_TRACT | Status: DC

## 2023-12-09 MED ORDER — LACTATED RINGERS IV SOLN: 150.0000 mL/h | INTRAVENOUS | Status: DC

## 2023-12-09 MED ORDER — GUAIFENESIN-DM 100-10 MG/5ML PO SYRP
10.0000 mL | ORAL_SOLUTION | Freq: Three times a day (TID) | ORAL | Status: DC
  Administered 2023-12-09 – 2023-12-11 (×6): 10 mL via ORAL
  Filled 2023-12-09 (×6): qty 10

## 2023-12-09 MED ORDER — ONDANSETRON HCL 4 MG/2ML IJ SOLN: 4.0000 mg | Freq: Four times a day (QID) | INTRAMUSCULAR | Status: DC | PRN

## 2023-12-09 MED ORDER — PANTOPRAZOLE SODIUM 40 MG PO TBEC
40.0000 mg | DELAYED_RELEASE_TABLET | Freq: Two times a day (BID) | ORAL | Status: DC
  Administered 2023-12-09 – 2023-12-11 (×4): 40 mg via ORAL
  Filled 2023-12-09 (×4): qty 1

## 2023-12-09 MED ORDER — ACETAMINOPHEN 650 MG RE SUPP: 650.0000 mg | Freq: Four times a day (QID) | RECTAL | Status: DC | PRN

## 2023-12-09 MED ORDER — HEPARIN SODIUM (PORCINE) 5000 UNIT/ML IJ SOLN
5000.0000 [IU] | Freq: Three times a day (TID) | INTRAMUSCULAR | Status: DC
  Administered 2023-12-09 – 2023-12-11 (×5): 5000 [IU] via SUBCUTANEOUS
  Filled 2023-12-09 (×5): qty 1

## 2023-12-09 MED ORDER — LEVALBUTEROL HCL 0.63 MG/3ML IN NEBU: 0.6300 mg | INHALATION_SOLUTION | Freq: Four times a day (QID) | RESPIRATORY_TRACT | Status: DC

## 2023-12-09 MED ORDER — IPRATROPIUM BROMIDE 0.02 % IN SOLN: 0.5000 mg | Freq: Three times a day (TID) | RESPIRATORY_TRACT | Status: DC | PRN

## 2023-12-09 MED ORDER — TRAZODONE HCL 50 MG PO TABS: 25.0000 mg | ORAL_TABLET | Freq: Every evening | ORAL | Status: DC | PRN

## 2023-12-09 MED ORDER — FERROUS SULFATE 325 (65 FE) MG PO TABS
325.0000 mg | ORAL_TABLET | ORAL | Status: DC
  Administered 2023-12-11: 325 mg via ORAL
  Filled 2023-12-09: qty 1

## 2023-12-09 MED ORDER — SODIUM CHLORIDE 0.9% FLUSH
3.0000 mL | Freq: Two times a day (BID) | INTRAVENOUS | Status: DC
  Administered 2023-12-09 – 2023-12-11 (×3): 3 mL via INTRAVENOUS

## 2023-12-09 MED ORDER — ASPIRIN 81 MG PO TBEC
81.0000 mg | DELAYED_RELEASE_TABLET | Freq: Every day | ORAL | Status: DC
  Administered 2023-12-09 – 2023-12-11 (×3): 81 mg via ORAL
  Filled 2023-12-09 (×3): qty 1

## 2023-12-09 MED ORDER — LEVALBUTEROL HCL 0.63 MG/3ML IN NEBU: 0.6300 mg | INHALATION_SOLUTION | Freq: Three times a day (TID) | RESPIRATORY_TRACT | Status: DC | PRN

## 2023-12-09 MED ORDER — ACETAMINOPHEN 325 MG PO TABS
650.0000 mg | ORAL_TABLET | Freq: Four times a day (QID) | ORAL | Status: DC | PRN
Start: 2023-12-09 — End: 2023-12-11

## 2023-12-09 MED ORDER — AZITHROMYCIN 500 MG IV SOLR
500.0000 mg | Freq: Once | INTRAVENOUS | Status: AC
  Administered 2023-12-09: 500 mg via INTRAVENOUS
  Filled 2023-12-09: qty 5

## 2023-12-09 MED ORDER — SODIUM CHLORIDE 0.9 % IV SOLN
1.0000 g | Freq: Once | INTRAVENOUS | Status: AC
  Administered 2023-12-09: 1 g via INTRAVENOUS
  Filled 2023-12-09: qty 10

## 2023-12-09 MED ORDER — SODIUM CHLORIDE 0.9% FLUSH: 3.0000 mL | INTRAVENOUS | Status: DC | PRN

## 2023-12-09 MED ORDER — FLEET ENEMA RE ENEM: 1.0000 | ENEMA | Freq: Once | RECTAL | Status: DC | PRN

## 2023-12-09 MED ORDER — SODIUM CHLORIDE 0.9 % IV SOLN
500.0000 mg | INTRAVENOUS | Status: DC
  Administered 2023-12-10: 500 mg via INTRAVENOUS
  Filled 2023-12-09 (×2): qty 5

## 2023-12-09 MED ORDER — FLUTICASONE PROPIONATE 50 MCG/ACT NA SUSP
2.0000 | Freq: Every day | NASAL | Status: DC
  Administered 2023-12-10 – 2023-12-11 (×2): 2 via NASAL
  Filled 2023-12-09: qty 16

## 2023-12-09 MED ORDER — METHYLPREDNISOLONE SODIUM SUCC 40 MG IJ SOLR
40.0000 mg | Freq: Two times a day (BID) | INTRAMUSCULAR | Status: DC
  Administered 2023-12-09 – 2023-12-11 (×4): 40 mg via INTRAVENOUS
  Filled 2023-12-09 (×4): qty 1

## 2023-12-09 MED ORDER — IPRATROPIUM BROMIDE 0.02 % IN SOLN: 0.5000 mg | Freq: Three times a day (TID) | RESPIRATORY_TRACT | Status: DC

## 2023-12-09 NOTE — Hospital Course (Addendum)
Christine Leblanc is a 89 year old female with PMX of allergy, asthma, COPD, arthritis, GERD, anemia, DJD, HLD, HTN, hypothyroidism, presenting with progressive shortness of breath, cough.   Since early this morning reporting worsening headaches, dry cough, and runny nose.    ED course/evaluation: Blood pressure (!) 182/95, pulse (!) 104, temperature 98.2 F (36.8 C), RR(!) 22, height 5\' 1"  (1.549 m), weight 46.3 kg, SpO2 97%.  Labs: BMP within normal limits with exception of glucose 116, BUN 25, C13.0, hemoglobin 10.2, neutrophils of 10.8, Influenza A/B, SARS-CoV-2 all negative CT head: Chronic microvascular disease, atrophy, otherwise within normal limits,  Chest x-ray: Biapical airspace opacity overlying biapical pleural/pulmonary scarring. Interval development of left base airspace opacity. Finding may represent combination of infection/inflammation versus atelectasis. Followup PA and lateral  Aortic Atherosclerosis   Currently patient to be admitted as she meets SIRS criteria, ruling out sepsis. Initiated on antibiotics,

## 2023-12-09 NOTE — Assessment & Plan Note (Signed)
-   Currently stable, home medications reviewed, not on any BP meds -Utilizing as needed hydralazine

## 2023-12-09 NOTE — Assessment & Plan Note (Signed)
Continue iron supplements

## 2023-12-09 NOTE — Assessment & Plan Note (Signed)
-  Continue statins ?

## 2023-12-09 NOTE — ED Provider Notes (Signed)
[facility] EMERGENCY DEPARTMENT AT [facility] Provider Note   CSN: [id number] Arrival date & time: 12/09/23  0825     History  Chief Complaint  Patient presents with   flu like symptoms    Christine Leblanc is a 89 y.o. female.  With a history of COPD, anemia and dysphagia presents to the ED for GEN malaise and headache.  Patient awoke this morning with a headache and diffuse aches throughout her body.  She also notes a dry cough that started today.  She pushed her life alert button and informed her daughter of her symptoms.  Daughter then called EMS for transport.  Here in the ED patient continues to reportA headache but states it is improved from earlier.  She is not having chest pain, shortness of breath, focal weakness, abdominal pain, nausea or vomiting.  No fevers chills or recent illness.  HPI     Home Medications Prior to Admission medications   Medication Sig Start Date End Date Taking? Authorizing Provider  albuterol (VENTOLIN HFA) 108 (90 Base) MCG/ACT inhaler Inhale 1-2 puffs into the lungs every 6 (six) hours as needed for wheezing or shortness of breath. 12/02/23  Yes Birder Robson, MD  aspirin EC 81 MG tablet Take 1 tablet (81 mg total) by mouth daily. Swallow whole. 10/31/23  Yes Furth, Cadence H, PA-C  Carbinoxamine Maleate 4 MG TABS Take 1 tablet (4 mg total) by mouth at bedtime. 01/02/23 12/09/23 Yes Alfonse Spruce, MD  diazepam (VALIUM) 5 MG tablet Take 5 mg by mouth daily as needed. 05/29/23  Yes [provider]  ferrous sulfate 325 (65 FE) MG tablet Take 325 mg by mouth. Takes one on Monday, Wednesday and Friday   Yes [provider]  fluticasone (FLONASE) 50 MCG/ACT nasal spray Place 2 sprays into both nostrils daily. 12/02/23  Yes Patel, Ellen Henri, MD  ipratropium (ATROVENT) 0.06 % nasal spray Place 1 spray into both nostrils 4 (four) times daily as needed for rhinitis. 12/02/23  Yes Birder Robson, MD  levothyroxine (SYNTHROID) 88 MCG  tablet TAKE 1 TABLET DAILY ON MONDAY THROUGH SATURDAY AND ONE-HALF (1/2) TABLET ON SUNDAY Patient taking differently: Take 88 mcg by mouth See admin instructions. TAKE 1 TABLET DAILY ON MONDAY THROUGH SATURDAY AND ONE-HALF (1/2) TABLET ON SUNDAY 10/10/22  Yes Reather Littler, MD  montelukast (SINGULAIR) 10 MG tablet Take 1 tablet (10 mg total) by mouth at bedtime. 10/03/23  Yes Parrett, Tammy S, NP  pantoprazole (PROTONIX) 40 MG tablet Take 1 tablet (40 mg total) by mouth 2 (two) times daily before a meal. 11/06/23  Yes Clearance Coots, Kristen S, PA-C  pravastatin (PRAVACHOL) 40 MG tablet Take 2 tablets (80 mg total) by mouth daily. 11/06/23  Yes BranchDorothe Pea, MD  senna (SENOKOT) 8.6 MG tablet Take 2 tablets by mouth at bedtime.   Yes [provider]  acetaminophen (TYLENOL) 325 MG tablet Take 2 tablets (650 mg total) by mouth every 6 (six) hours as needed for mild pain (or Fever >/= 101). Patient not taking: Reported on 10/31/2023 08/15/23   Shon Hale, MD  cetirizine (ZYRTEC) 10 MG tablet Take 1 tablet (10 mg total) by mouth daily. 12/02/23   Birder Robson, MD      Allergies    Celecoxib, Dexlansoprazole, and Famotidine    Review of Systems   Review of Systems  Physical Exam Updated Vital Signs BP (!) 182/95 (BP Location: Left Arm)   Pulse (!) 104  Temp 98.2 F (36.8 C) (Oral)   Resp (!) 22   Ht 5\' 1"  (1.549 m)   Wt 46.3 kg   SpO2 97%   BMI 19.27 kg/m  Physical Exam Vitals and nursing note reviewed.  HENT:     Head: Normocephalic and atraumatic.  Eyes:     Extraocular Movements: Extraocular movements intact.     Pupils: Pupils are equal, round, and reactive to light.  Cardiovascular:     Rate and Rhythm: Normal rate and regular rhythm.  Pulmonary:     Effort: Pulmonary effort is normal.     Breath sounds: Normal breath sounds.  Abdominal:     Palpations: Abdomen is soft.     Tenderness: There is no abdominal tenderness.  Skin:    General: Skin is warm and dry.   Neurological:     General: No focal deficit present.     Mental Status: She is alert and oriented to person, place, and time.     Sensory: No sensory deficit.     Motor: No weakness.  Psychiatric:        Mood and Affect: Mood normal.     ED Results / Procedures / Treatments   Labs (all labs ordered are listed, but only abnormal results are displayed) Labs Reviewed  COMPREHENSIVE METABOLIC PANEL - Abnormal; Notable for the following components:      Result Value   Glucose, Bld 116 (*)    BUN 25 (*)    GFR, Estimated 59 (*)    All other components within normal limits  CBC WITH DIFFERENTIAL/PLATELET - Abnormal; Notable for the following components:   WBC 13.0 (*)    RBC 3.45 (*)    Hemoglobin 10.2 (*)    HCT 31.9 (*)    Neutro Abs 10.8 (*)    Monocytes Absolute 1.1 (*)    All other components within normal limits  RESP PANEL BY RT-PCR (RSV, FLU A&B, COVID)  RVPGX2  EXPECTORATED SPUTUM ASSESSMENT W GRAM STAIN, RFLX TO RESP C  CULTURE, BLOOD (ROUTINE X 2)  CULTURE, BLOOD (ROUTINE X 2)  URINALYSIS, ROUTINE W REFLEX MICROSCOPIC  LACTIC ACID, PLASMA  LACTIC ACID, PLASMA  MAGNESIUM  PHOSPHORUS  PROCALCITONIN    EKG EKG Interpretation Date/Time:  Monday December 09 2023 11:14:23 EST Ventricular Rate:  94 PR Interval:  158 QRS Duration:  126 QT Interval:  394 QTC Calculation: 493 R Axis:   93  Text Interpretation: Sinus rhythm Atrial premature complex RBBB and LPFB ST elevation, consider lateral injury Confirmed by Estelle June [phone]) on 12/09/2023 1:57:53 PM  Radiology CT Head Wo Contrast  Result Date: 12/09/2023 CLINICAL DATA:  Sudden onset severe headache. EXAM: CT HEAD WITHOUT CONTRAST TECHNIQUE: Contiguous axial images were obtained from the base of the skull through the vertex without intravenous contrast. RADIATION DOSE REDUCTION: This exam was performed according to the departmental dose-optimization program which includes automated exposure control,  adjustment of the mA and/or kV according to patient size and/or use of iterative reconstruction technique. COMPARISON:  CT head dated December 16, 2021. FINDINGS: Brain: No evidence of acute infarction, hemorrhage, hydrocephalus, extra-axial collection or mass lesion/mass effect. Stable atrophy and chronic microvascular ischemic changes. Vascular: Calcified atherosclerosis at the skull base. No hyperdense vessel. Skull: Normal. Negative for fracture or focal lesion. Sinuses/Orbits: Scattered mild paranasal sinus mucosal thickening with small retention cysts. No air-fluid levels. The orbits are unremarkable. Other: None. IMPRESSION: 1. No acute intracranial abnormality. 2. Stable atrophy and chronic microvascular ischemic changes. Electronically  Signed   By: Obie Dredge M.D.   On: 12/09/2023 10:25   DG Chest Portable 1 View  Result Date: 12/09/2023 CLINICAL DATA:  ?pna EXAM: PORTABLE CHEST 1 VIEW COMPARISON:  Chest x-ray 08/15/2023, CT chest 08/02/2012 FINDINGS: The heart and mediastinal contours are unchanged. Aortic calcification. Hyperinflation of the lungs. Interval development of biapical airspace opacities overlying biapical pleural/pulmonary scarring. Interval development of left base airspace opacity. Coarsened interstitial markings and cystic changes with no overt pulmonary edema. No pleural effusion. No pneumothorax. No acute osseous abnormality. Total bilateral shoulder arthroplasty. . IMPRESSION: 1. Interval development of biapical airspace opacity overlying biapical pleural/pulmonary scarring. Interval development of left base airspace opacity. Finding may represent combination of infection/inflammation versus atelectasis. Followup PA and lateral chest X-ray is recommended in 3-4 weeks following therapy to ensure resolution. 2. Aortic Atherosclerosis (ICD10-I70.0) and Emphysema (ICD10-J43.9). Electronically Signed   By: Tish Frederickson M.D.   On: 12/09/2023 09:05    Procedures Procedures     Medications Ordered in ED Medications  cefTRIAXone (ROCEPHIN) 1 g in sodium chloride 0.9 % 100 mL IVPB (has no administration in time range)  azithromycin (ZITHROMAX) 500 mg in sodium chloride 0.9 % 250 mL IVPB (has no administration in time range)  heparin injection 5,000 Units (has no administration in time range)  sodium chloride flush (NS) 0.9 % injection 3 mL (has no administration in time range)  sodium chloride flush (NS) 0.9 % injection 3 mL (has no administration in time range)  sodium chloride flush (NS) 0.9 % injection 3 mL (has no administration in time range)  0.9 %  sodium chloride infusion (has no administration in time range)  acetaminophen (TYLENOL) tablet 650 mg (has no administration in time range)    Or  acetaminophen (TYLENOL) suppository 650 mg (has no administration in time range)  oxyCODONE (Oxy IR/ROXICODONE) immediate release tablet 5 mg (has no administration in time range)  HYDROmorphone (DILAUDID) injection 0.5-1 mg (has no administration in time range)  traZODone (DESYREL) tablet 25 mg (has no administration in time range)  senna-docusate (Senokot-S) tablet 1 tablet (has no administration in time range)  bisacodyl (DULCOLAX) EC tablet 5 mg (has no administration in time range)  sodium phosphate (FLEET) enema 1 enema (has no administration in time range)  ondansetron (ZOFRAN) tablet 4 mg (has no administration in time range)    Or  ondansetron (ZOFRAN) injection 4 mg (has no administration in time range)  ipratropium (ATROVENT) nebulizer solution 0.5 mg (has no administration in time range)  levalbuterol (XOPENEX) nebulizer solution 0.63 mg (has no administration in time range)  hydrALAZINE (APRESOLINE) injection 10 mg (has no administration in time range)  guaiFENesin-dextromethorphan (ROBITUSSIN DM) 100-10 MG/5ML syrup 10 mL (has no administration in time range)  methylPREDNISolone sodium succinate (SOLU-MEDROL) 40 mg/mL injection 40 mg (has no  administration in time range)  lactated ringers infusion (has no administration in time range)  lactated ringers bolus 1,000 mL (has no administration in time range)    And  lactated ringers bolus 500 mL (has no administration in time range)  cefTRIAXone (ROCEPHIN) 2 g in sodium chloride 0.9 % 100 mL IVPB (has no administration in time range)  azithromycin (ZITHROMAX) 500 mg in sodium chloride 0.9 % 250 mL IVPB (has no administration in time range)  acetaminophen (TYLENOL) tablet 650 mg (650 mg Oral Given 12/09/23 0936)    ED Course/ Medical Decision Making/ A&P Clinical Course as of 12/09/23 1358  Mon Dec 09, 2023  1345 Negative COVID  flu RSV Metabolic panel unremarkable CBC shows leukocytosis of 13 with neutrophilic predominance.  CT head shows no acute intracranial abnormalities Chest x-ray concerning for interval development of biapical airspace opacity which may be acute infection versus atelectasis  Discussed these findings with patient's daughter who is now at bedside Daughter voices concern for recently increased productive coughing as well as subjective fevers at home and increased confusion Considering this clinical picture coupled with the leukocytosis and concern for new infiltrates, will treat for community-acquired pneumonia and admit to medicine.  Discussed admitting hospitalist who accepts patient for admission [MP]    Clinical Course User Index [MP] Royanne Foots, DO                                 Medical Decision Making 89 year old female with history as above presenting for headache and bodyaches beginning this morning.  Was in her normal state of health yesterday.  Has a dry cough as well but no other overt infectious symptoms.  No focal neurologic deficits on my exam.  Patient is awake alert and oriented.  Initial vital signs notable for tachypnea and tachycardia.  She is hypertensive here.  Differential diagnosis includes Acute hemorrhagic CVA.  Woke up with  headache.  Will obtain CT head Noncon to evaluate for intracranial hemorrhage Ischemic stroke.  Lower suspicion given lack of focal neurologic deficits on exam. Viral respiratory illness.  Cough and bodyaches.  Afebrile here.  Will obtain viral respiratory swab and give her a dose of Tylenol here. Anemia.  She does have a history of anemia.  Will obtain CBC.  No acute blood loss based on history Electrolyte imbalance.  Will obtain comprehensive metabolic panel Acute bacterial infection such as pneumonia or UTI.  Will obtain chest x-ray and UA to evaluate  Daughter will be coming to the ED shortly and will obtain any additional history from her at bedside  Amount and/or Complexity of Data Reviewed Labs: ordered. Radiology: ordered.  Risk OTC drugs. Decision regarding hospitalization.           Final Clinical Impression(s) / ED Diagnoses Final diagnoses:  Community acquired pneumonia, unspecified laterality  Altered mental status, unspecified altered mental status type    Rx / DC Orders ED Discharge Orders     None         Royanne Foots, DO 12/09/23 1358

## 2023-12-09 NOTE — Assessment & Plan Note (Signed)
Continue PPI ?

## 2023-12-09 NOTE — Assessment & Plan Note (Signed)
-   SIRS criteria with tachycardia, tachypnea, leukocytosis Likely due to pneumonia Chest x-ray concerning for infiltrate, multifocal pneumonia -Follow-up with labs including lactic acid, procalcitonin -In ED started on IV antibiotics Rocephin/azithromycin-will continue -Will follow-up with blood/sputum cultures -Continue DuoNeb bronchodilators -Mucolytics -IV steroids

## 2023-12-09 NOTE — Sepsis Progress Note (Signed)
Phone call to nurse to get lactic acid drawn

## 2023-12-09 NOTE — Assessment & Plan Note (Signed)
-   Continue antibiotics of azithromycin/Rocephin -Will follow blood and sputum cultures

## 2023-12-09 NOTE — Sepsis Progress Note (Signed)
Notified bedside nurse of need to draw lactic acid.  

## 2023-12-09 NOTE — Assessment & Plan Note (Signed)
-   Continue aspirin, continue statins ?

## 2023-12-09 NOTE — Assessment & Plan Note (Signed)
-  Iron deficiency anemia, continue iron supplements -Trend H&H closely    Latest Ref Rng & Units 12/09/2023    8:52 AM 10/18/2023    1:27 PM 10/17/2023   12:51 AM  CBC  WBC 4.0 - 10.5 K/uL 13.0  13.1  12.0   Hemoglobin 12.0 - 15.0 g/dL 09.8  9.8  11.9   Hematocrit 36.0 - 46.0 % 31.9  30.3  32.6   Platelets 150 - 400 K/uL 168  215  208

## 2023-12-09 NOTE — ED Triage Notes (Signed)
Pt states she woke up this am not feeling good. Daughter called EMS for flu like s/s.

## 2023-12-09 NOTE — Assessment & Plan Note (Signed)
-   Consulting PT/OT for evaluation recommendations

## 2023-12-09 NOTE — Assessment & Plan Note (Signed)
-   Stable, satting > 92% on room air -Continue supplemental oxygen, to maintain O2 sat greater than 90% -As needed DuoNeb bronchodilators

## 2023-12-09 NOTE — ED Triage Notes (Signed)
Pt is diabteic CBG 134 per EMS. PT states she took half of a valium last night

## 2023-12-09 NOTE — Assessment & Plan Note (Signed)
-  Continue home medication of Synthroid 

## 2023-12-09 NOTE — Sepsis Progress Note (Signed)
Elink monitoring for the code sepsis protocol.  

## 2023-12-09 NOTE — H&P (Signed)
History and Physical   Patient: Christine Leblanc                            PCP: Benita Stabile, MD                    DOB: [birth year]            DOA: 12/09/2023 WGN:[id number]             DOS: 12/09/2023, 2:11 PM  Benita Stabile, MD  Patient coming from:   HOME  I have personally reviewed patient's medical records, in electronic medical records, including:  [facility], and care everywhere.    Chief Complaint:   Chief Complaint  Patient presents with   flu like symptoms    History of present illness:    Christine Leblanc is a 89 year old female with PMX of allergy, asthma, COPD, arthritis, GERD, anemia, DJD, HLD, HTN, hypothyroidism, presenting with progressive shortness of breath, cough.   Since early this morning reporting worsening headaches, dry cough, and runny nose.    ED course/evaluation: Blood pressure (!) 182/95, pulse (!) 104, temperature 98.2 F (36.8 C), RR(!) 22, height 5\' 1"  (1.549 m), weight 46.3 kg, SpO2 97%.  Labs: BMP within normal limits with exception of glucose 116, BUN 25, C13.0, hemoglobin 10.2, neutrophils of 10.8, Influenza A/B, SARS-CoV-2 all negative CT head: Chronic microvascular disease, atrophy, otherwise within normal limits,  Chest x-ray: Biapical airspace opacity overlying biapical pleural/pulmonary scarring. Interval development of left base airspace opacity. Finding may represent combination of infection/inflammation versus atelectasis. Followup PA and lateral  Aortic Atherosclerosis   Currently patient to be admitted as she meets SIRS criteria, ruling out sepsis. Initiated on antibiotics,     Patient Denies having: Fever, Chills, Chest Pain, Abd pain, N/V/D, dizziness, lightheadedness,  Dysuria, Joint pain, rash, open wounds  abs;   Review of Systems: As per HPI, otherwise 10 point review of systems were negative.    ----------------------------------------------------------------------------------------------------------------------  Allergies  Allergen Reactions   Celecoxib Shortness Of Breath   Dexlansoprazole Anaphylaxis and Other (See Comments)    abd pain   Famotidine Other (See Comments)    Makes her feel "very bad"    Home MEDs:  Prior to Admission medications   Medication Sig Start Date End Date Taking? Authorizing Provider  albuterol (VENTOLIN HFA) 108 (90 Base) MCG/ACT inhaler Inhale 1-2 puffs into the lungs every 6 (six) hours as needed for wheezing or shortness of breath. 12/02/23  Yes Birder Robson, MD  aspirin EC 81 MG tablet Take 1 tablet (81 mg total) by mouth daily. Swallow whole. 10/31/23  Yes Furth, Cadence H, PA-C  Carbinoxamine Maleate 4 MG TABS Take 1 tablet (4 mg total) by mouth at bedtime. 01/02/23 12/09/23 Yes Alfonse Spruce, MD  diazepam (VALIUM) 5 MG tablet Take 5 mg by mouth daily as needed. 05/29/23  Yes [provider]  ferrous sulfate 325 (65 FE) MG tablet Take 325 mg by mouth. Takes one on Monday, Wednesday and Friday   Yes [provider]  fluticasone (FLONASE) 50 MCG/ACT nasal spray Place 2 sprays into both nostrils daily. 12/02/23  Yes Patel, Ellen Henri, MD  ipratropium (ATROVENT) 0.06 % nasal spray Place 1 spray into both nostrils 4 (four) times daily as needed for rhinitis. 12/02/23  Yes Birder Robson, MD  levothyroxine (SYNTHROID) 88 MCG tablet TAKE 1 TABLET DAILY ON MONDAY THROUGH SATURDAY  AND ONE-HALF (1/2) TABLET ON SUNDAY Patient taking differently: Take 88 mcg by mouth See admin instructions. TAKE 1 TABLET DAILY ON MONDAY THROUGH SATURDAY AND ONE-HALF (1/2) TABLET ON SUNDAY 10/10/22  Yes Reather Littler, MD  montelukast (SINGULAIR) 10 MG tablet Take 1 tablet (10 mg total) by mouth at bedtime. 10/03/23  Yes Parrett, Tammy S, NP  pantoprazole (PROTONIX) 40 MG tablet Take 1 tablet (40 mg total) by mouth 2 (two) times daily before a meal. 11/06/23   Yes Clearance Coots, Kristen S, PA-C  pravastatin (PRAVACHOL) 40 MG tablet Take 2 tablets (80 mg total) by mouth daily. 11/06/23  Yes BranchDorothe Pea, MD  senna (SENOKOT) 8.6 MG tablet Take 2 tablets by mouth at bedtime.   Yes [provider]  acetaminophen (TYLENOL) 325 MG tablet Take 2 tablets (650 mg total) by mouth every 6 (six) hours as needed for mild pain (or Fever >/= 101). Patient not taking: Reported on 10/31/2023 08/15/23   Shon Hale, MD  cetirizine (ZYRTEC) 10 MG tablet Take 1 tablet (10 mg total) by mouth daily. 12/02/23   Birder Robson, MD    PRN MEDs: sodium chloride, acetaminophen **OR** acetaminophen, bisacodyl, diazepam, hydrALAZINE, HYDROmorphone (DILAUDID) injection, ondansetron **OR** ondansetron (ZOFRAN) IV, oxyCODONE, senna-docusate, sodium chloride flush, sodium phosphate, traZODone  Past Medical History:  Diagnosis Date   Anemia    Asthma    Baker's cyst    Cancer (HCC)    skin Ca- ? basal cell    Chronic back pain    Chronic neck pain    Degenerative joint disease    Left shoulder; cervical spine, knees & hands    Gastroesophageal reflux disease    Hiatal hernia; distal esophageal web requiring dilatation; gastric polyps; gastritis; refuses colonoscopy   GERD (gastroesophageal reflux disease)    History of stress test 1990's   stress test done under the care of Dr. Dietrich Pates & Dr. Allyson Sabal, now being followed by Dr. Wyline Mood- in [place] , recently seen & told to f/U in one yr.    Hyperlipidemia 04/27/2011   Hypertension    Hypothyroidism    Lymphocytic thyroiditis    Mild carotid artery disease (HCC)    Multiple thyroid nodules 2010   Adenomatous; thyroidectomy in 2010   Orthostasis    Osteoarthritis of right knee 07/27/2014   Pneumonia    hosp. for pneumonia- long time ago    Primary localized osteoarthrosis of right shoulder 06/21/2015   Seizures (HCC)    yes- as a child- & into adult years, states she took med. for them at one time, stopped at  32 yrs. of age    Syncope and collapse 2007   Possible CVA in 2012 with left lower extremity weakness; refused hospitalization; CT-Atrophy and chronic microvascular ischemic change.     Past Surgical History:  Procedure Laterality Date   ABDOMINAL HYSTERECTOMY     fibroids   CATARACT EXTRACTION     Bilateral; redo surgery on the right for incomplete primary procedure   COLONOSCOPY  Remote   ESOPHAGOGASTRODUODENOSCOPY  11/2009   Dr. Darrick Penna: probable distal web s/p dilation small hiatal hernia/gastric polyps/mild gastritis, appeared to have narrowing at the junction of D1 and D2 dilated up to 12 mm   ESOPHAGOGASTRODUODENOSCOPY N/A 03/21/2015   Surgeon: West Bali, MD; proximal esophageal web s/p dilation, multiple gastric polyps s/p multiple biopsies, mild erosive gastritis s/p biopsy.  Pathology with fundic gland polyps, chronic gastritis, negative for H. pylori.   EYE SURGERY  PROLAPSED UTERINE FIBROID LIGATION     outcomed with rectocele & cystocele   SAVORY DILATION N/A 03/21/2015   Procedure: SAVORY DILATION;  Surgeon: West Bali, MD;  Location: [facility];  Service: Endoscopy;  Laterality: N/A;   SHOULDER SURGERY     left   TOTAL KNEE ARTHROPLASTY Right 07/27/2014   Procedure: RIGHT TOTAL KNEE ARTHROPLASTY;  Surgeon: Eulas Post, MD;  Location: [facility] OR;  Service: Orthopedics;  Laterality: Right;   TOTAL SHOULDER ARTHROPLASTY Right 06/21/2015   Procedure: RIGHT TOTAL SHOULDER ARTHROPLASTY;  Surgeon: Teryl Lucy, MD;  Location: [facility] OR;  Service: Orthopedics;  Laterality: Right;   TOTAL THYROIDECTOMY  2010     reports that she quit smoking about 44 years ago. Her smoking use included cigarettes. She started smoking about 64 years ago. She has a 16 pack-year smoking history. She has never used smokeless tobacco. She reports that she does not currently use alcohol. She reports that she does not use drugs.   Family History  Problem Relation Age of Onset   Heart  disease Mother    Gallbladder disease Mother    Heart failure Mother    Diabetes Mother    Breast cancer Sister        30 years ago   Diabetes Sister    Heart attack Father    Diabetes Maternal Grandmother    Colon cancer Neg Hx    Stroke Neg Hx     Physical Exam:   Vitals:   12/09/23 0836 12/09/23 0841 12/09/23 1358 12/09/23 1400  BP:  (!) 182/95  (!) 140/76  Pulse:  (!) 104    Resp:  (!) 22  (!) 25  Temp:  98.2 F (36.8 C) 97.9 F (36.6 C)   TempSrc:  Oral Oral   SpO2:  97%    Weight: 46.3 kg     Height: 5\' 1"  (1.549 m)      Constitutional: NAD, calm, comfortable Eyes: PERRL, lids and conjunctivae normal ENMT: Mucous membranes are moist. Posterior pharynx clear of any exudate or lesions.Normal dentition.  Neck: normal, supple, no masses, no thyromegaly Respiratory: clear to auscultation bilaterally, no wheezing, no crackles. Normal respiratory effort. No accessory muscle use.  Cardiovascular: Regular rate and rhythm, no murmurs / rubs / gallops. No extremity edema. 2+ pedal pulses. No carotid bruits.  Abdomen: no tenderness, no masses palpated. No hepatosplenomegaly. Bowel sounds positive.  Musculoskeletal: Gen. Weakness -  no clubbing / cyanosis. No joint deformity upper and lower extremities. Good ROM, no contractures. Normal muscle tone.  Neurologic: CN II-XII grossly intact. Sensation intact, DTR normal. Strength 5/5 in all 4.  Psychiatric: Normal judgment and insight. Alert and oriented x 3. Normal mood.  Skin: no rashes, lesions, ulcers. No induration          Labs on admission:    I have personally reviewed following labs and imaging studies  CBC: Recent Labs  Lab 12/09/23 0852  WBC 13.0*  NEUTROABS 10.8*  HGB 10.2*  HCT 31.9*  MCV 92.5  PLT 168   Basic Metabolic Panel: Recent Labs  Lab 12/09/23 0852  NA 141  K 3.6  CL 105  CO2 27  GLUCOSE 116*  BUN 25*  CREATININE 0.91  CALCIUM 9.4   GFR: Estimated Creatinine Clearance: 28.8 mL/min  (by C-G formula based on SCr of 0.91 mg/dL). Liver Function Tests: Recent Labs  Lab 12/09/23 0852  AST 21  ALT 13  ALKPHOS 62  BILITOT 0.5  PROT 7.4  ALBUMIN 3.5   No results for input(s): "LIPASE", "AMYLASE" in the last 168 hours. No results for input(s): "AMMONIA" in the last 168 hours. Coagulation Profile: No results for input(s): "INR", "PROTIME" in the last 168 hours. Cardiac Enzymes: No results for input(s): "CKTOTAL", "CKMB", "CKMBINDEX", "TROPONINI" in the last 168 hours. BNP (last 3 results) No results for input(s): "PROBNP" in the last 8760 hours. HbA1C: No results for input(s): "HGBA1C" in the last 72 hours. CBG: No results for input(s): "GLUCAP" in the last 168 hours. Lipid Profile: No results for input(s): "CHOL", "HDL", "LDLCALC", "TRIG", "CHOLHDL", "LDLDIRECT" in the last 72 hours. Thyroid Function Tests: No results for input(s): "TSH", "T4TOTAL", "FREET4", "T3FREE", "THYROIDAB" in the last 72 hours. Anemia Panel: No results for input(s): "VITAMINB12", "FOLATE", "FERRITIN", "TIBC", "IRON", "RETICCTPCT" in the last 72 hours. Urine analysis:    Component Value Date/Time   COLORURINE YELLOW 06/22/2022 0013   APPEARANCEUR HAZY (A) 06/22/2022 0013   LABSPEC 1.021 06/22/2022 0013   PHURINE 5.0 06/22/2022 0013   GLUCOSEU NEGATIVE 06/22/2022 0013   HGBUR NEGATIVE 06/22/2022 0013   BILIRUBINUR NEGATIVE 06/22/2022 0013   KETONESUR 5 (A) 06/22/2022 0013   PROTEINUR 100 (A) 06/22/2022 0013   UROBILINOGEN 0.2 08/02/2012 1603   NITRITE NEGATIVE 06/22/2022 0013   LEUKOCYTESUR NEGATIVE 06/22/2022 0013    Last A1C:  No results found for: "HGBA1C"   Radiologic Exams on Admission:   CT Head Wo Contrast  Result Date: 12/09/2023 CLINICAL DATA:  Sudden onset severe headache. EXAM: CT HEAD WITHOUT CONTRAST TECHNIQUE: Contiguous axial images were obtained from the base of the skull through the vertex without intravenous contrast. RADIATION DOSE REDUCTION: This exam was  performed according to the departmental dose-optimization program which includes automated exposure control, adjustment of the mA and/or kV according to patient size and/or use of iterative reconstruction technique. COMPARISON:  CT head dated December 16, 2021. FINDINGS: Brain: No evidence of acute infarction, hemorrhage, hydrocephalus, extra-axial collection or mass lesion/mass effect. Stable atrophy and chronic microvascular ischemic changes. Vascular: Calcified atherosclerosis at the skull base. No hyperdense vessel. Skull: Normal. Negative for fracture or focal lesion. Sinuses/Orbits: Scattered mild paranasal sinus mucosal thickening with small retention cysts. No air-fluid levels. The orbits are unremarkable. Other: None. IMPRESSION: 1. No acute intracranial abnormality. 2. Stable atrophy and chronic microvascular ischemic changes. Electronically Signed   By: Obie Dredge M.D.   On: 12/09/2023 10:25   DG Chest Portable 1 View  Result Date: 12/09/2023 CLINICAL DATA:  ?pna EXAM: PORTABLE CHEST 1 VIEW COMPARISON:  Chest x-ray 08/15/2023, CT chest 08/02/2012 FINDINGS: The heart and mediastinal contours are unchanged. Aortic calcification. Hyperinflation of the lungs. Interval development of biapical airspace opacities overlying biapical pleural/pulmonary scarring. Interval development of left base airspace opacity. Coarsened interstitial markings and cystic changes with no overt pulmonary edema. No pleural effusion. No pneumothorax. No acute osseous abnormality. Total bilateral shoulder arthroplasty. . IMPRESSION: 1. Interval development of biapical airspace opacity overlying biapical pleural/pulmonary scarring. Interval development of left base airspace opacity. Finding may represent combination of infection/inflammation versus atelectasis. Followup PA and lateral chest X-ray is recommended in 3-4 weeks following therapy to ensure resolution. 2. Aortic Atherosclerosis (ICD10-I70.0) and Emphysema  (ICD10-J43.9). Electronically Signed   By: Tish Frederickson M.D.   On: 12/09/2023 09:05    EKG:   Independently reviewed.  Orders placed or performed during the hospital encounter of 12/09/23   ED EKG   ED EKG   EKG 12-Lead   EKG 12-Lead   EKG 12-Lead  EKG 12-Lead   EKG 12-Lead   ---------------------------------------------------------------------------------------------------------------------------------------    Assessment / Plan:   Principal Problem:   SIRS (systemic inflammatory response syndrome) (HCC) Active Problems:   CAP (community acquired pneumonia)   Anemia, normocytic normochromic   COPD (chronic obstructive pulmonary disease) (HCC)   Gastroesophageal reflux disease   Iron deficiency anemia   Dyslipidemia   Essential hypertension   Hypothyroidism   Generalized weakness   History of TIA (transient ischemic attack)   Dysphagia   Assessment and Plan: * SIRS (systemic inflammatory response syndrome) (HCC) - SIRS criteria with tachycardia, tachypnea, leukocytosis Likely due to pneumonia Chest x-ray concerning for infiltrate, multifocal pneumonia -Follow-up with labs including lactic acid, procalcitonin -In ED started on IV antibiotics Rocephin/azithromycin-will continue -Will follow-up with blood/sputum cultures -Continue DuoNeb bronchodilators -Mucolytics -IV steroids  CAP (community acquired pneumonia) - Continue antibiotics of azithromycin/Rocephin -Will follow blood and sputum cultures  Anemia, normocytic normochromic -Iron deficiency anemia, continue iron supplements -Trend H&H closely    Latest Ref Rng & Units 12/09/2023    8:52 AM 10/18/2023    1:27 PM 10/17/2023   12:51 AM  CBC  WBC 4.0 - 10.5 K/uL 13.0  13.1  12.0   Hemoglobin 12.0 - 15.0 g/dL 62.9  9.8  52.8   Hematocrit 36.0 - 46.0 % 31.9  30.3  32.6   Platelets 150 - 400 K/uL 168  215  208      COPD (chronic obstructive pulmonary disease) (HCC) - Stable, satting > 92% on room  air -Continue supplemental oxygen, to maintain O2 sat greater than 90% -As needed DuoNeb bronchodilators  Gastroesophageal reflux disease - Continue PPI  Iron deficiency anemia - Continue iron supplements  Dysphagia - Monitoring oral intake -Aspiration precaution  History of TIA (transient ischemic attack) - Continue aspirin, continue statins  Generalized weakness - Consulting PT/OT for evaluation recommendations  Hypothyroidism - Continue home medication of Synthroid  Essential hypertension - Currently stable, home medications reviewed, not on any BP meds -Utilizing as needed hydralazine  Dyslipidemia - Continue statins              Consults called:  None -------------------------------------------------------------------------------------------------------------------------------------------- DVT prophylaxis:  heparin injection 5,000 Units Start: 12/09/23 2200 TED hose Start: 12/09/23 1348 SCDs Start: 12/09/23 1348   Code Status:   Code Status: Full Code   Admission status: Patient will be admitted as Inpatient, with a greater than 2 midnight length of stay. Level of care: Telemetry   Family Communication:  none at bedside  (The above findings and plan of care has been discussed with patient in detail, the patient expressed understanding and agreement of above plan)  ---------------------------------------------------------------------------------------------------------------------------------------------  Disposition Plan:  Anticipated 1-2 days Status is: Inpatient Remains inpatient appropriate because: Being IV antibiotics, breathing treatments, -------------------------------------------------------------------------------------------------------------------------------------------  Time spent:  44  Min.  Was spent seeing and evaluating the patient, reviewing all medical records, drawn plan of care.  SIGNED: Kendell Bane, MD, FHM.  FAAFP. [place] [facility], Pager  (Please use [email or web address] to page/ or secure chat through epic) If 7PM-7AM, please contact night-coverage [email or web address],  12/09/2023, 2:11 PM

## 2023-12-09 NOTE — Assessment & Plan Note (Signed)
-   Monitoring oral intake -Aspiration precaution

## 2023-12-09 NOTE — Progress Notes (Signed)
Pts family is bedside and concerned about pt not taking her AM synthroid today. Family states that she has to have this medication and if we do not give it to her tonight they will bring it from home. Family educated and instructed that she will recive the medication at 0600 as scheduled. Also educated that we cannot bring out side medications to pts. MD messaged, awaiting response.

## 2023-12-10 DIAGNOSIS — R6510 Systemic inflammatory response syndrome (SIRS) of non-infectious origin without acute organ dysfunction: Secondary | ICD-10-CM | POA: Diagnosis not present

## 2023-12-10 LAB — CBC
HCT: 29.4 % — ABNORMAL LOW (ref 36.0–46.0)
Hemoglobin: 9.1 g/dL — ABNORMAL LOW (ref 12.0–15.0)
MCH: 28.7 pg (ref 26.0–34.0)
MCHC: 31 g/dL (ref 30.0–36.0)
MCV: 92.7 fL (ref 80.0–100.0)
Platelets: 155 10*3/uL (ref 150–400)
RBC: 3.17 MIL/uL — ABNORMAL LOW (ref 3.87–5.11)
RDW: 14.1 % (ref 11.5–15.5)
WBC: 8.3 10*3/uL (ref 4.0–10.5)
nRBC: 0 % (ref 0.0–0.2)

## 2023-12-10 LAB — BASIC METABOLIC PANEL
Anion gap: 9 (ref 5–15)
BUN: 19 mg/dL (ref 8–23)
CO2: 25 mmol/L (ref 22–32)
Calcium: 9 mg/dL (ref 8.9–10.3)
Chloride: 104 mmol/L (ref 98–111)
Creatinine, Ser: 0.84 mg/dL (ref 0.44–1.00)
GFR, Estimated: >60 mL/min (ref >60)
Glucose, Bld: 108 mg/dL — ABNORMAL HIGH (ref 70–99)
Potassium: 3.6 mmol/L (ref 3.5–5.1)
Sodium: 138 mmol/L (ref 135–145)

## 2023-12-10 LAB — GLUCOSE, CAPILLARY: Glucose-Capillary: 121 mg/dL — ABNORMAL HIGH (ref 70–99)

## 2023-12-10 NOTE — Plan of Care (Signed)
  Problem: Acute Rehab PT Goals(only PT should resolve) Goal: Pt Will Go Supine/Side To Sit Outcome: Progressing Flowsheets (Taken 12/10/2023 1430) Pt will go Supine/Side to Sit:  Independently  with modified independence Goal: Patient Will Transfer Sit To/From Stand Outcome: Progressing Flowsheets (Taken 12/10/2023 1430) Patient will transfer sit to/from stand:  with supervision  with modified independence Goal: Pt Will Transfer Bed To Chair/Chair To Bed Outcome: Progressing Flowsheets (Taken 12/10/2023 1430) Pt will Transfer Bed to Chair/Chair to Bed:  with supervision  with modified independence Goal: Pt Will Ambulate Outcome: Progressing Flowsheets (Taken 12/10/2023 1430) Pt will Ambulate:  100 feet  with modified independence  with supervision  with rolling walker   2:31 PM, 12/10/23 Ocie Bob, MPT Physical Therapist with [facility] [phone] office [phone] mobile phone

## 2023-12-10 NOTE — TOC Initial Note (Signed)
Transition of Care [facility]) - Initial/Assessment Note    Patient Details  Name: Christine Leblanc MRN: [id number] Date of Birth: 13-Apr-1929  Transition of Care [facility]) CM/SW Contact:    Isabella Bowens, LCSWA Phone Number: 12/10/2023, 12:04 PM  Clinical Narrative:  Patient is high risk for admission and was admitted for SIRS ( Systemic inflammatory response syndrome) . CSW spoke with daughter Lawson Fiscal, then went to see patient at bedside. Patient is accepting to having HHPT/ HH aide from another company other than [facility], due to a bad experience.  Patient agreeable to having [facility] come out for HHPT and HH Aide. Patient lives with daughter and son who takes care of her. Patient has a walker and WC at home for DME. TOC will continue to follow.              Expected Discharge Plan: Home w Home Health Services Barriers to Discharge: Continued Medical Work up   Patient Goals and CMS Choice Patient states their goals for this hospitalization and ongoing recovery are:: return home CMS Medicare.gov Compare Post Acute Care list provided to:: Patient Choice offered to / list presented to : Patient      Expected Discharge Plan and Services In-house Referral: Clinical Social Work     Living arrangements for the past 2 months: Single Family Home                     HH Arranged: PT, Nurse's Aide HH Agency: [facility] Date [facility] Agency Contacted: 12/10/23 Time HH Agency Contacted: 1200 Representative spoke with at [facility] Agency: Kandee Keen  Prior Living Arrangements/Services Living arrangements for the past 2 months: Single Family Home Lives with:: Adult Children Patient language and need for interpreter reviewed:: Yes Do you feel safe going back to the place where you live?: Yes      Need for Family Participation in Patient Care: Yes (Comment) Care giver support system in place?: Yes (comment) Current home services: DME Criminal Activity/Legal Involvement Pertinent to Current  Situation/Hospitalization: No - Comment as needed  Activities of Daily Living   ADL Screening (condition at time of admission) Independently performs ADLs?: Yes (appropriate for developmental age) Is the patient deaf or have difficulty hearing?: No Does the patient have difficulty seeing, even when wearing glasses/contacts?: No Does the patient have difficulty concentrating, remembering, or making decisions?: No  Permission Sought/Granted Permission sought to share information with : Family Supports    Share Information with NAME: Lawson Fiscal and Saunya     Permission granted to share info w Relationship: Patient and Daughter     Emotional Assessment Appearance:: Appears stated age Attitude/Demeanor/Rapport: Engaged Affect (typically observed): Accepting, Appropriate, Pleasant Orientation: : Oriented to Self, Oriented to Place Alcohol / Substance Use: Not Applicable Psych Involvement: No (comment)  Admission diagnosis:  SIRS (systemic inflammatory response syndrome) (HCC) [R65.10] Altered mental status, unspecified altered mental status type [R41.82] Community acquired pneumonia, unspecified laterality [J18.9] Patient Active Problem List   Diagnosis Date Noted   SIRS (systemic inflammatory response syndrome) (HCC) 12/09/2023   CAP (community acquired pneumonia) 12/09/2023   Chronic cough 10/03/2023   Dysphagia 08/15/2023   Protein-calorie malnutrition, mild (HCC) 06/22/2022   Acute bronchitis 06/22/2022   Iron deficiency anemia 06/22/2022   COPD (chronic obstructive pulmonary disease) (HCC) 06/22/2022   Loss of weight 10/09/2021   Pharyngeal dysphagia 10/09/2021   Mild carotid artery disease (HCC) 05/19/2020   History of TIA (transient ischemic attack) 12/25/2018   Generalized weakness 09/30/2018  MGUS (monoclonal gammopathy of unknown significance) 10/21/2017   Primary localized osteoarthrosis of right shoulder 06/21/2015   S/P shoulder replacement 06/21/2015   Oropharyngeal  dysphagia 11/29/2014   Osteoarthritis of right knee 07/27/2014   Postsurgical hypothyroidism 04/22/2014   Hypothyroidism 10/12/2013   Syncope and collapse    Anemia, normocytic normochromic 04/26/2012   Gastroesophageal reflux disease    Essential hypertension 12/07/2011   Leg pain, bilateral 04/27/2011   Dyslipidemia 04/27/2011   Other dysphagia 06/07/2010   PCP:  Benita Stabile, MD Pharmacy:   [facility] [phone] - [place] - [address] AT [facility] OF [address] & [address]  [place] [address] Phone: [phone] Fax: [phone]  [facility] - [place], [place] - [address] [place] [address] Phone: [phone] Fax: [phone]     Social Determinants of Health (SDOH) Social History: SDOH Screenings   Food Insecurity: No Food Insecurity (12/09/2023)  Housing: Patient Declined (12/09/2023)  Transportation Needs: No Transportation Needs (12/09/2023)  Utilities: Not At Risk (12/09/2023)  Tobacco Use: Medium Risk (12/02/2023)   SDOH Interventions:     Readmission Risk Interventions    12/10/2023   11:57 AM  Readmission Risk Prevention Plan  Transportation Screening Complete  HRI or Home Care Consult Complete  Social Work Consult for Recovery Care Planning/Counseling Complete  Palliative Care Screening Not Applicable  Medication Review Oceanographer) Complete

## 2023-12-10 NOTE — Evaluation (Signed)
Occupational Therapy Evaluation Patient Details Name: Christine Leblanc MRN: [id number] DOB: [birth year] Today's Date: 12/10/2023   History of Present Illness Christine Leblanc is a 89 year old female with PMX of allergy, asthma, COPD, arthritis, GERD, anemia, DJD, HLD, HTN, hypothyroidism, presenting with progressive shortness of breath, cough.    Since early this morning reporting worsening headaches, dry cough, and runny nose.   Clinical Impression   Pt agreeable to OT evaluation, son present. Pt performing mobility at supervision/CGA level. Pt standing at sink for grooming without LOB, performing toileting tasks with supervision. Son reports pt seems near baseline with mobility, family assists with LB dressing. Educated son on tub bench for getting into and out of tub since pt cannot do that at this time. No further OT services required.        If plan is discharge home, recommend the following: A little help with walking and/or transfers;A little help with bathing/dressing/bathroom;Help with stairs or ramp for entrance    Functional Status Assessment  Patient has had a recent decline in their functional status and demonstrates the ability to make significant improvements in function in a reasonable and predictable amount of time.  Equipment Recommendations  Tub/shower bench       Precautions / Restrictions Precautions Precautions: Fall Restrictions Weight Bearing Restrictions: No      Mobility Bed Mobility Overal bed mobility: Modified Independent                  Transfers Overall transfer level: Needs assistance Equipment used: Rolling walker (2 wheels) Transfers: Sit to/from Stand Sit to Stand: Min assist                      ADL either performed or assessed with clinical judgement   ADL Overall ADL's : Needs assistance/impaired Eating/Feeding: Modified independent;Bed level   Grooming: Wash/dry hands;Wash/dry face;Contact guard assist;Standing Grooming  Details (indicate cue type and reason): pt completing grooming tasks standing at sink, no LOB during task without UE support                 Toilet Transfer: Contact guard assist;Ambulation;Rolling walker (2 wheels);Grab bars Toilet Transfer Details (indicate cue type and reason): Cuing to use grab bar Toileting- Clothing Manipulation and Hygiene: Supervision/safety;Sitting/lateral lean       Functional mobility during ADLs: Contact guard assist;Rolling walker (2 wheels)       Vision Baseline Vision/History: 0 No visual deficits Ability to See in Adequate Light: 0 Adequate Patient Visual Report: No change from baseline Vision Assessment?: No apparent visual deficits            Pertinent Vitals/Pain Pain Assessment Pain Assessment: No/denies pain     Extremity/Trunk Assessment Upper Extremity Assessment Upper Extremity Assessment: Generalized weakness   Lower Extremity Assessment Lower Extremity Assessment: Defer to PT evaluation   Cervical / Trunk Assessment Cervical / Trunk Assessment: Kyphotic   Communication Communication Communication: No apparent difficulties   Cognition Arousal: Alert Behavior During Therapy: WFL for tasks assessed/performed Overall Cognitive Status: Within Functional Limits for tasks assessed                                                  Home Living Family/patient expects to be discharged to:: Private residence Living Arrangements: Children Available Help at Discharge: Family;Available 24 hours/day Type of Home: House Home  Access: Stairs to enter [employer] of Steps: 1 Entrance Stairs-Rails: None Home Layout: One level;Laundry or work area in basement;Able to live on main level with bedroom/bathroom     Bathroom Shower/Tub: Chief Strategy Officer: Standard Bathroom Accessibility: Yes   Home Equipment: Agricultural consultant (2 wheels);Cane - single point;Grab bars - tub/shower;Shower  seat;BSC/3in1          Prior Functioning/Environment Prior Level of Function : Needs assist       Physical Assist : Mobility (physical);ADLs (physical) Mobility (physical): Bed mobility;Transfers;Gait;Stairs   Mobility Comments: household ambulation using RW ADLs Comments: Assisted by family for LB dressing, meal preparation, housekeeping        OT Problem List: Impaired balance (sitting and/or standing);Decreased activity tolerance;Decreased knowledge of use of DME or AE       AM-PAC OT "6 Clicks" Daily Activity     Outcome Measure Help from another person eating meals?: None Help from another person taking care of personal grooming?: A Little Help from another person toileting, which includes using toliet, bedpan, or urinal?: A Little Help from another person bathing (including washing, rinsing, drying)?: A Little Help from another person to put on and taking off regular upper body clothing?: A Little Help from another person to put on and taking off regular lower body clothing?: A Lot 6 Click Score: 18   End of Session Equipment Utilized During Treatment: Gait belt;Rolling walker (2 wheels)  Activity Tolerance: Patient tolerated treatment well Patient left: in bed;with call bell/phone within reach;with bed alarm set;with family/visitor present  OT Visit Diagnosis: Muscle weakness (generalized) (M62.81)                Time: 6213-0865 OT Time Calculation (min): 36 min Charges:  OT General Charges $OT Visit: 1 Visit OT Evaluation $OT Eval Low Complexity: 1 Low OT Treatments $Self Care/Home Management : 8-22 mins  Ezra Sites, OTR/L  [phone] 12/10/2023, 4:22 PM

## 2023-12-10 NOTE — Progress Notes (Signed)
PROGRESS NOTE    Patient: Christine Leblanc                            PCP: Benita Stabile, MD                    DOB: 05/29/1929            DOA: 12/09/2023 ZOX:[id number]             DOS: 12/10/2023, 11:32 AM   LOS: 1 day   Date of Service: The patient was seen and examined on 12/10/2023  Subjective:   The patient was seen and examined this morning. Hemodynamically stable. No issues overnight .  Brief Narrative:   Christine Leblanc is a 89 year old female with PMX of allergy, asthma, COPD, arthritis, GERD, anemia, DJD, HLD, HTN, hypothyroidism, presenting with progressive shortness of breath, cough.   Since early this morning reporting worsening headaches, dry cough, and runny nose.    ED course/evaluation: Blood pressure (!) 182/95, pulse (!) 104, temperature 98.2 F (36.8 C), RR(!) 22, height 5\' 1"  (1.549 m), weight 46.3 kg, SpO2 97%.  Labs: BMP within normal limits with exception of glucose 116, BUN 25, C13.0, hemoglobin 10.2, neutrophils of 10.8, Influenza A/B, SARS-CoV-2 all negative CT head: Chronic microvascular disease, atrophy, otherwise within normal limits,  Chest x-ray: Biapical airspace opacity overlying biapical pleural/pulmonary scarring. Interval development of left base airspace opacity. Finding may represent combination of infection/inflammation versus atelectasis. Followup PA and lateral  Aortic Atherosclerosis   Currently patient to be admitted as she meets SIRS criteria, ruling out sepsis. Initiated on antibiotics,     Assessment & Plan:   Principal Problem:   SIRS (systemic inflammatory response syndrome) (HCC) Active Problems:   CAP (community acquired pneumonia)   Anemia, normocytic normochromic   COPD (chronic obstructive pulmonary disease) (HCC)   Gastroesophageal reflux disease   Iron deficiency anemia   Dyslipidemia   Essential hypertension   Hypothyroidism   Generalized weakness   History of TIA (transient ischemic attack)    Dysphagia     Assessment and Plan: * SIRS (systemic inflammatory response syndrome) (HCC) -Sepsis ruled out, SARS physiology improved - POA: SIRS criteria with tachycardia, tachypnea, leukocytosis Likely due to pneumonia Chest x-ray concerning for infiltrate, multifocal pneumonia -Lactic acid 1.7, Pro-Cal <0.10  -In ED started on IV antibiotics Rocephin/azithromycin-will continue  -Will follow-up with blood/sputum cultures -Continue DuoNeb bronchodilators -Mucolytics -IV steroids-with quick taper  If cultures remain negative-switching to p.o. antibiotics in a.m. and planning for discharge  CAP (community acquired pneumonia) - Continue antibiotics of azithromycin/Rocephin -Will follow blood and sputum cultures-if cultures remain negative switching to p.o. antibiotics in a.m.  Anemia, normocytic normochromic -Iron deficiency anemia, continue iron supplements -Trend H&H closely    Latest Ref Rng & Units 12/10/2023    4:20 AM 12/09/2023    8:52 AM 10/18/2023    1:27 PM  CBC  WBC 4.0 - 10.5 K/uL 8.3  13.0  13.1   Hemoglobin 12.0 - 15.0 g/dL 9.1  81.1  9.8   Hematocrit 36.0 - 46.0 % 29.4  31.9  30.3   Platelets 150 - 400 K/uL 155  168  215        COPD (chronic obstructive pulmonary disease) (HCC) - Stable, satting > 92% >> now satting 99% on room air -On admission was satting 90% on 2 L -As needed DuoNeb bronchodilators  Gastroesophageal reflux disease - Continue  PPI  Iron deficiency anemia - Continue iron supplements  Dysphagia - Monitoring oral intake -Aspiration precaution  History of TIA (transient ischemic attack) - Continue aspirin, continue statins  Generalized weakness - Consulting PT/OT for evaluation recommendations  Hypothyroidism - Continue home medication of Synthroid  Essential hypertension - Currently stable, home medications reviewed, not on any BP meds -Utilizing as needed hydralazine  Dyslipidemia - Continue  statins    ------------------------------------------------------------------------------------------------------------------------------------------- Nutritional status:  The patient's BMI is: Body mass index is 19.62 kg/m. I agree with the assessment and plan as outlined  Skin Assessment: I have examined the patient's skin and I agree with the wound assessment as performed by wound care team --------------------------------------------------------------------------------------------------------------------------------------------- Cultures; Blood Cultures x 2 >>    --------------------------------------------------------------------------------------------------------------------------------------------  DVT prophylaxis:  heparin injection 5,000 Units Start: 12/09/23 2200 TED hose Start: 12/09/23 1348 SCDs Start: 12/09/23 1348   Code Status:   Code Status: Full Code  Family Communication: Son/daughter present at bedside-updated The above findings and plan of care has been discussed with patient (and family)  in detail,  they expressed understanding and agreement of above. -Advance care planning has been discussed.   Admission status:   Status is: Inpatient Remains inpatient appropriate because: Needing IV antibiotics IV fluids   Disposition: From  - home             Planning for discharge in 1 Day Home W HH and PO med  Procedures:   No admission procedures for hospital encounter.   Antimicrobials:  Anti-infectives (From admission, onward)    Start     Dose/Rate Route Frequency Ordered Stop   12/10/23 1030  azithromycin (ZITHROMAX) 500 mg in sodium chloride 0.9 % 250 mL IVPB        500 mg 250 mL/hr over 60 Minutes Intravenous Every 24 hours 12/09/23 1351 12/14/23 1029   12/10/23 1000  cefTRIAXone (ROCEPHIN) 2 g in sodium chloride 0.9 % 100 mL IVPB        2 g 200 mL/hr over 30 Minutes Intravenous Every 24 hours 12/09/23 1351 12/15/23 0959   12/09/23 1345   cefTRIAXone (ROCEPHIN) 1 g in sodium chloride 0.9 % 100 mL IVPB        1 g 200 mL/hr over 30 Minutes Intravenous  Once 12/09/23 1332 12/09/23 1438   12/09/23 1345  azithromycin (ZITHROMAX) 500 mg in sodium chloride 0.9 % 250 mL IVPB        500 mg 250 mL/hr over 60 Minutes Intravenous  Once 12/09/23 1332 12/09/23 1737        Medication:   aspirin EC  81 mg Oral Daily   [START ON 12/11/2023] ferrous sulfate  325 mg Oral Once per day on Monday Wednesday Friday   fluticasone  2 spray Each Nare Daily   glycopyrrolate  1 mg Oral BID   guaiFENesin-dextromethorphan  10 mL Oral Q8H   heparin  5,000 Units Subcutaneous Q8H   levothyroxine  88 mcg Oral Daily   methylPREDNISolone (SOLU-MEDROL) injection  40 mg Intravenous Q12H   montelukast  10 mg Oral QHS   pantoprazole  40 mg Oral BID AC   pravastatin  80 mg Oral Daily   senna  2 tablet Oral QHS   sodium chloride flush  3 mL Intravenous Q12H   sodium chloride flush  3 mL Intravenous Q12H    sodium chloride, acetaminophen **OR** acetaminophen, bisacodyl, diazepam, hydrALAZINE, HYDROmorphone (DILAUDID) injection, ipratropium, levalbuterol, ondansetron **OR** ondansetron (ZOFRAN) IV, oxyCODONE, senna-docusate, sodium chloride flush, sodium phosphate, traZODone  Objective:   Vitals:   12/09/23 1452 12/09/23 1633 12/09/23 2100 12/10/23 0523  BP: (!) 158/92 (!) 156/85 136/78 (!) 155/86  Pulse: 91 90 80 72  Resp: 20 20 16 14   Temp: 97.8 F (36.6 C) 98.6 F (37 C) 97.8 F (36.6 C) (!) 97.4 F (36.3 C)  TempSrc: Oral  Oral Oral  SpO2: 100% 100% 97% 99%  Weight:    47.1 kg  Height:        Intake/Output Summary (Last 24 hours) at 12/10/2023 1132 Last data filed at 12/10/2023 0500 Gross per 24 hour  Intake 504.14 ml  Output 300 ml  Net 204.14 ml   Filed Weights   12/09/23 0836 12/10/23 0523  Weight: 46.3 kg 47.1 kg     Physical examination:   Constitution: Generalized cachexia,  alert, cooperative, no distress,  Appears  calm and comfortable  Psychiatric:   Normal and stable mood and affect, cognition intact,   HEENT:        Normocephalic, PERRL, otherwise with in Normal limits  Chest:         Chest symmetric Cardio vascular:  S1/S2, RRR, No murmure, No Rubs or Gallops  pulmonary: Clear to auscultation bilaterally, respirations unlabored, negative wheezes / crackles Abdomen: Soft, non-tender, non-distended, bowel sounds,no masses, no organomegaly Muscular skeletal: Global generalized weaknesses  Limited exam - in bed, able to move all 4 extremities,   Neuro: CNII-XII intact. , normal motor and sensation, reflexes intact  Extremities: No pitting edema lower extremities, +2 pulses  Skin: Dry, warm to touch, negative for any Rashes, No open wounds  ------------------------------------------------------------------------------------------------------------------------------------------    LABs:     Latest Ref Rng & Units 12/10/2023    4:20 AM 12/09/2023    8:52 AM 10/18/2023    1:27 PM  CBC  WBC 4.0 - 10.5 K/uL 8.3  13.0  13.1   Hemoglobin 12.0 - 15.0 g/dL 9.1  16.1  9.8   Hematocrit 36.0 - 46.0 % 29.4  31.9  30.3   Platelets 150 - 400 K/uL 155  168  215       Latest Ref Rng & Units 12/10/2023    4:20 AM 12/09/2023    8:52 AM 10/18/2023    1:27 PM  CMP  Glucose 70 - 99 mg/dL 096  045  90   BUN 8 - 23 mg/dL 19  25  25    Creatinine 0.44 - 1.00 mg/dL 4.09  8.11  9.14   Sodium 135 - 145 mmol/L 138  141  138   Potassium 3.5 - 5.1 mmol/L 3.6  3.6  3.4   Chloride 98 - 111 mmol/L 104  105  105   CO2 22 - 32 mmol/L 25  27  26    Calcium 8.9 - 10.3 mg/dL 9.0  9.4  8.7   Total Protein 6.5 - 8.1 g/dL  7.4  7.0   Total Bilirubin <1.2 mg/dL  0.5  0.6   Alkaline Phos 38 - 126 U/L  62  66   AST 15 - 41 U/L  21  30   ALT 0 - 44 U/L  13  15        Micro Results Recent Results (from the past 240 hour(s))  Resp panel by RT-PCR (RSV, Flu A&B, Covid) Anterior Nasal Swab     Status: None   Collection Time:  12/09/23  9:16 AM   Specimen: Anterior Nasal Swab  Result Value Ref Range Status   SARS Coronavirus 2 by RT  PCR NEGATIVE NEGATIVE Final    Comment: (NOTE) SARS-CoV-2 target nucleic acids are NOT DETECTED.  The SARS-CoV-2 RNA is generally detectable in upper respiratory specimens during the acute phase of infection. The lowest concentration of SARS-CoV-2 viral copies this assay can detect is 138 copies/mL. A negative result does not preclude SARS-Cov-2 infection and should not be used as the sole basis for treatment or other patient management decisions. A negative result may occur with  improper specimen collection/handling, submission of specimen other than nasopharyngeal swab, presence of viral mutation(s) within the areas targeted by this assay, and inadequate number of viral copies(<138 copies/mL). A negative result must be combined with clinical observations, patient history, and epidemiological information. The expected result is Negative.  Fact Sheet for Patients:  BloggerCourse.com  Fact Sheet for Healthcare Providers:  SeriousBroker.it  This test is no t yet approved or cleared by the [place] FDA and  has been authorized for detection and/or diagnosis of SARS-CoV-2 by FDA under an Emergency Use Authorization (EUA). This EUA will remain  in effect (meaning this test can be used) for the duration of the COVID-19 declaration under Section 564(b)(1) of the Act, 21 U.S.C.section 360bbb-3(b)(1), unless the authorization is terminated  or revoked sooner.       Influenza A by PCR NEGATIVE NEGATIVE Final   Influenza B by PCR NEGATIVE NEGATIVE Final    Comment: (NOTE) The Xpert Xpress SARS-CoV-2/FLU/RSV plus assay is intended as an aid in the diagnosis of influenza from Nasopharyngeal swab specimens and should not be used as a sole basis for treatment. Nasal washings and aspirates are unacceptable for Xpert Xpress  SARS-CoV-2/FLU/RSV testing.  Fact Sheet for Patients: BloggerCourse.com  Fact Sheet for Healthcare Providers: SeriousBroker.it  This test is not yet approved or cleared by the [place] FDA and has been authorized for detection and/or diagnosis of SARS-CoV-2 by FDA under an Emergency Use Authorization (EUA). This EUA will remain in effect (meaning this test can be used) for the duration of the COVID-19 declaration under Section 564(b)(1) of the Act, 21 U.S.C. section 360bbb-3(b)(1), unless the authorization is terminated or revoked.     Resp Syncytial Virus by PCR NEGATIVE NEGATIVE Final    Comment: (NOTE) Fact Sheet for Patients: BloggerCourse.com  Fact Sheet for Healthcare Providers: SeriousBroker.it  This test is not yet approved or cleared by the [place] FDA and has been authorized for detection and/or diagnosis of SARS-CoV-2 by FDA under an Emergency Use Authorization (EUA). This EUA will remain in effect (meaning this test can be used) for the duration of the COVID-19 declaration under Section 564(b)(1) of the Act, 21 U.S.C. section 360bbb-3(b)(1), unless the authorization is terminated or revoked.  Performed at [facility], [address] [place], [place] [address]   Culture, blood (x 2)     Status: None (Preliminary result)   Collection Time: 12/09/23  1:57 PM   Specimen: Right Antecubital; Blood  Result Value Ref Range Status   Specimen Description RIGHT ANTECUBITAL  Final   Special Requests   Final    AEROBIC BOTTLE ONLY Blood Culture results may not be optimal due to an inadequate volume of blood received in culture bottles   Culture   Final    NO GROWTH < 24 HOURS Performed at [facility], [address], [place], [place] [address]    Report Status PENDING  Incomplete  Culture, blood (x 2)     Status: None (Preliminary result)   Collection Time:  12/09/23  2:14 PM  Specimen: BLOOD  Result Value Ref Range Status   Specimen Description BLOOD BLOOD RIGHT HAND  Final   Special Requests   Final    BOTTLES DRAWN AEROBIC AND ANAEROBIC Blood Culture adequate volume   Culture   Final    NO GROWTH < 24 HOURS Performed at [facility], [address] [place], [place] [address]    Report Status PENDING  Incomplete    Radiology Reports No results found.  SIGNED: Kendell Bane, MD, FHM. FAAFP. [facility] - [facility] Time spent - 55 min.  In seeing, evaluating and examining the patient. Reviewing medical records, labs, drawn plan of care. [facility],  Pager (please use [email or web address] to page/ text) Please use Epic Secure Chat for non-urgent communication (7AM-7PM)  If 7PM-7AM, please contact night-coverage [email or web address], 12/10/2023, 11:32 AM

## 2023-12-10 NOTE — Evaluation (Signed)
Physical Therapy Evaluation Patient Details Name: Christine Leblanc MRN: [id number] DOB: January 17, 1929 Today's Date: 12/10/2023  History of Present Illness  Christine Leblanc is a 89 year old female with PMX of allergy, asthma, COPD, arthritis, GERD, anemia, DJD, HLD, HTN, hypothyroidism, presenting with progressive shortness of breath, cough.    Since early this morning reporting worsening headaches, dry cough, and runny nose.   Clinical Impression  Patient demonstrate good return for bed mobility, transferring to/from commode in bathroom, chair at bedside and able to ambulate in hallway using RW without loss of balance.  Patient tolerated sitting up in chair after therapy. Patient will benefit from continued skilled physical therapy in hospital and recommended venue below to increase strength, balance, endurance for safe ADLs and gait.          If plan is discharge home, recommend the following: A little help with walking and/or transfers;A little help with bathing/dressing/bathroom;Help with stairs or ramp for entrance;Assistance with cooking/housework   Can travel by private vehicle        Equipment Recommendations None recommended by PT  Recommendations for Other Services       Functional Status Assessment Patient has had a recent decline in their functional status and demonstrates the ability to make significant improvements in function in a reasonable and predictable amount of time.     Precautions / Restrictions Precautions Precautions: Fall Restrictions Weight Bearing Restrictions: No      Mobility  Bed Mobility Overal bed mobility: Modified Independent                  Transfers Overall transfer level: Needs assistance Equipment used: Rolling walker (2 wheels) Transfers: Sit to/from Stand, Bed to chair/wheelchair/BSC Sit to Stand: Supervision, Contact guard assist   Step pivot transfers: Contact guard assist, Supervision       General transfer comment:  slightly labored movement    Ambulation/Gait Ambulation/Gait assistance: Supervision, Contact guard assist Gait Distance (Feet): 75 Feet Assistive device: Rolling walker (2 wheels) Gait Pattern/deviations: Decreased step length - right, Decreased step length - left, Decreased stride length Gait velocity: decreased     General Gait Details: slightly labored cadence with good return for ambulating in room, hallway without loss of balance  Stairs            Wheelchair Mobility     Tilt Bed    Modified Rankin (Stroke Patients Only)       Balance Overall balance assessment: Needs assistance Sitting-balance support: Feet supported, No upper extremity supported Sitting balance-Leahy Scale: Good Sitting balance - Comments: seated at EOB   Standing balance support: Reliant on assistive device for balance, During functional activity, Bilateral upper extremity supported Standing balance-Leahy Scale: Fair Standing balance comment: fair/good using RW                             Pertinent Vitals/Pain Pain Assessment Pain Assessment: No/denies pain    Home Living Family/patient expects to be discharged to:: Private residence Living Arrangements: Children Available Help at Discharge: Family;Available 24 hours/day Type of Home: House Home Access: Stairs to enter Entrance Stairs-Rails: None Entrance Stairs-Number of Steps: 1   Home Layout: One level;Laundry or work area in basement;Able to live on main level with bedroom/bathroom Home Equipment: Agricultural consultant (2 wheels);Cane - single point;Grab bars - tub/shower;Shower seat;BSC/3in1      Prior Function Prior Level of Function : Needs assist       Physical Assist :  Mobility (physical);ADLs (physical) Mobility (physical): Bed mobility;Transfers;Gait;Stairs   Mobility Comments: household ambulation using RW ADLs Comments: Assisted by family     Extremity/Trunk Assessment   Upper Extremity  Assessment Upper Extremity Assessment: Defer to OT evaluation    Lower Extremity Assessment Lower Extremity Assessment: Generalized weakness    Cervical / Trunk Assessment Cervical / Trunk Assessment: Kyphotic  Communication   Communication Communication: No apparent difficulties  Cognition Arousal: Alert Behavior During Therapy: WFL for tasks assessed/performed Overall Cognitive Status: Within Functional Limits for tasks assessed                                          General Comments      Exercises     Assessment/Plan    PT Assessment Patient needs continued PT services  PT Problem List Decreased strength;Decreased activity tolerance;Decreased balance;Decreased mobility       PT Treatment Interventions DME instruction;Gait training;Stair training;Functional mobility training;Therapeutic activities;Therapeutic exercise;Balance training;Patient/family education    PT Goals (Current goals can be found in the Care Plan section)  Acute Rehab PT Goals Patient Stated Goal: return home with family to assist PT Goal Formulation: With patient Time For Goal Achievement: 12/13/23 Potential to Achieve Goals: Good    Frequency Min 3X/week     Co-evaluation               AM-PAC PT "6 Clicks" Mobility  Outcome Measure Help needed turning from your back to your side while in a flat bed without using bedrails?: None Help needed moving from lying on your back to sitting on the side of a flat bed without using bedrails?: None Help needed moving to and from a bed to a chair (including a wheelchair)?: A Little Help needed standing up from a chair using your arms (e.g., wheelchair or bedside chair)?: A Little Help needed to walk in hospital room?: A Little Help needed climbing 3-5 steps with a railing? : A Lot 6 Click Score: 19    End of Session   Activity Tolerance: Patient tolerated treatment well;Patient limited by fatigue Patient left: in chair;with  call bell/phone within reach Nurse Communication: Mobility status PT Visit Diagnosis: Unsteadiness on feet (R26.81);Other abnormalities of gait and mobility (R26.89);Muscle weakness (generalized) (M62.81)    Time: 5784-6962 PT Time Calculation (min) (ACUTE ONLY): 28 min   Charges:   PT Evaluation $PT Eval Moderate Complexity: 1 Mod PT Treatments $Therapeutic Activity: 23-37 mins PT General Charges $$ ACUTE PT VISIT: 1 Visit         2:30 PM, 12/10/23 Ocie Bob, MPT Physical Therapist with [facility] [phone] office [phone] mobile phone

## 2023-12-10 NOTE — Plan of Care (Signed)
  Problem: Education: Goal: Knowledge of General Education information will improve Description: Including pain rating scale, medication(s)/side effects and non-pharmacologic comfort measures Outcome: Progressing   Problem: Clinical Measurements: Goal: Ability to maintain clinical measurements within normal limits will improve Outcome: Progressing   Problem: Coping: Goal: Level of anxiety will decrease Outcome: Progressing   

## 2023-12-11 ENCOUNTER — Other Ambulatory Visit: Payer: Self-pay | Admitting: Allergy & Immunology

## 2023-12-11 DIAGNOSIS — R6510 Systemic inflammatory response syndrome (SIRS) of non-infectious origin without acute organ dysfunction: Secondary | ICD-10-CM | POA: Diagnosis not present

## 2023-12-11 LAB — BASIC METABOLIC PANEL
Anion gap: 8 (ref 5–15)
BUN: 24 mg/dL — ABNORMAL HIGH (ref 8–23)
CO2: 23 mmol/L (ref 22–32)
Calcium: 8.8 mg/dL — ABNORMAL LOW (ref 8.9–10.3)
Chloride: 107 mmol/L (ref 98–111)
Creatinine, Ser: 0.83 mg/dL (ref 0.44–1.00)
GFR, Estimated: >60 mL/min (ref >60)
Glucose, Bld: 126 mg/dL — ABNORMAL HIGH (ref 70–99)
Potassium: 3.5 mmol/L (ref 3.5–5.1)
Sodium: 138 mmol/L (ref 135–145)

## 2023-12-11 LAB — CBC
HCT: 28 % — ABNORMAL LOW (ref 36.0–46.0)
Hemoglobin: 9.1 g/dL — ABNORMAL LOW (ref 12.0–15.0)
MCH: 29.5 pg (ref 26.0–34.0)
MCHC: 32.5 g/dL (ref 30.0–36.0)
MCV: 90.9 fL (ref 80.0–100.0)
Platelets: 175 10*3/uL (ref 150–400)
RBC: 3.08 MIL/uL — ABNORMAL LOW (ref 3.87–5.11)
RDW: 14 % (ref 11.5–15.5)
WBC: 10.1 10*3/uL (ref 4.0–10.5)
nRBC: 0 % (ref 0.0–0.2)

## 2023-12-11 LAB — GLUCOSE, CAPILLARY: Glucose-Capillary: 126 mg/dL — ABNORMAL HIGH (ref 70–99)

## 2023-12-11 MED ORDER — GUAIFENESIN-DM 100-10 MG/5ML PO SYRP: 10.0000 mL | ORAL_SOLUTION | Freq: Three times a day (TID) | ORAL | 0 refills | Status: DC

## 2023-12-11 MED ORDER — METHYLPREDNISOLONE 4 MG PO TBPK: ORAL_TABLET | ORAL | 0 refills | Status: DC

## 2023-12-11 MED ORDER — GLYCOPYRROLATE 1 MG PO TABS: 1.0000 mg | ORAL_TABLET | Freq: Two times a day (BID) | ORAL | 0 refills | Status: DC

## 2023-12-11 MED ORDER — LEVOFLOXACIN 250 MG PO TABS: 250.0000 mg | ORAL_TABLET | Freq: Every day | ORAL | 0 refills | Status: AC

## 2023-12-11 NOTE — TOC Transition Note (Signed)
Transition of Care [facility]) - CM/SW Discharge Note   Patient Details  Name: Christine Leblanc MRN: [id number] Date of Birth: 17-Mar-1929  Transition of Care [facility]) CM/SW Contact:  Villa Herb, LCSWA Phone Number: 12/11/2023, 10:35 AM   Clinical Narrative:    CSW updated that pt is medically stable for D/C home today. CSW updated Kandee Keen with [facility] of plan for D/C. HH orders have been placed by PT. HH information added to AVS for family and pt to review at D/C. TOC signing off.   Final next level of care: Home w Home Health Services Barriers to Discharge: Barriers Resolved   Patient Goals and CMS Choice CMS Medicare.gov Compare Post Acute Care list provided to:: Patient Choice offered to / list presented to : Patient  Discharge Placement                         Discharge Plan and Services Additional resources added to the After Visit Summary for   In-house Referral: Clinical Social Work                        HH Arranged: PT, Nurse's Aide HH Agency: [facility] Date [facility] Agency Contacted: 12/11/23 Time HH Agency Contacted: 1200 Representative spoke with at [facility] Agency: Kandee Keen  Social Determinants of Health (SDOH) Interventions SDOH Screenings   Food Insecurity: No Food Insecurity (12/09/2023)  Housing: Patient Declined (12/09/2023)  Transportation Needs: No Transportation Needs (12/09/2023)  Utilities: Not At Risk (12/09/2023)  Tobacco Use: Medium Risk (12/02/2023)     Readmission Risk Interventions    12/10/2023   11:57 AM  Readmission Risk Prevention Plan  Transportation Screening Complete  HRI or Home Care Consult Complete  Social Work Consult for Recovery Care Planning/Counseling Complete  Palliative Care Screening Not Applicable  Medication Review Oceanographer) Complete

## 2023-12-11 NOTE — Progress Notes (Signed)
Mobility Specialist Progress Note:  Pre Mobility:   12/11/23 1058  Mobility  Activity Ambulated with assistance in room;Ambulated with assistance to bathroom  Level of Assistance Minimal assist, patient does 75% or more  Assistive Device Front wheel walker  Distance Ambulated (ft) 20 ft  Range of Motion/Exercises Active;All extremities  Activity Response Tolerated well  Mobility Referral Yes  Mobility visit 1 Mobility  Mobility Specialist Start Time (ACUTE ONLY) 1015  Mobility Specialist Stop Time (ACUTE ONLY) 1025  Mobility Specialist Time Calculation (min) (ACUTE ONLY) 10 min   Pt received in chair, requesting assistance to bathroom. Required MinA to stand and CGA to ambulate with RW. Tolerated well, asx throughout. Left pt with RN and NT, all needs met.   Lawerance Bach Mobility Specialist Please contact via Special educational needs teacher or  Rehab office at [phone]

## 2023-12-11 NOTE — Discharge Summary (Signed)
Physician Discharge Summary   Patient: Christine Leblanc MRN: [id number] DOB: March 18, 1929  Admit date:     12/09/2023  Discharge date: 12/11/23  Discharge Physician: Kendell Bane   PCP: Benita Stabile, MD   Recommendations at discharge:   Aspiration precaution, while taking anything oral she must be upright -fully awake Oral hydration recommended Follow the PCP in 1-2 weeks Continue course of antibiotics Followed outpatient palliative care  Discharge Diagnoses: Principal Problem:   SIRS (systemic inflammatory response syndrome) (HCC) Active Problems:   CAP (community acquired pneumonia)   Anemia, normocytic normochromic   COPD (chronic obstructive pulmonary disease) (HCC)   Gastroesophageal reflux disease   Iron deficiency anemia   Dyslipidemia   Essential hypertension   Hypothyroidism   Generalized weakness   History of TIA (transient ischemic attack)   Dysphagia  Resolved Problems:   * No resolved hospital problems. *  Hospital Course: Christine Leblanc is a 89 year old female with PMX of allergy, asthma, COPD, arthritis, GERD, anemia, DJD, HLD, HTN, hypothyroidism, presenting with progressive shortness of breath, cough.   Since early this morning reporting worsening headaches, dry cough, and runny nose.    ED course/evaluation: Blood pressure (!) 182/95, pulse (!) 104, temperature 98.2 F (36.8 C), RR(!) 22, height 5\' 1"  (1.549 m), weight 46.3 kg, SpO2 97%.  Labs: BMP within normal limits with exception of glucose 116, BUN 25, C13.0, hemoglobin 10.2, neutrophils of 10.8, Influenza A/B, SARS-CoV-2 all negative CT head: Chronic microvascular disease, atrophy, otherwise within normal limits,  Chest x-ray: Biapical airspace opacity overlying biapical pleural/pulmonary scarring. Interval development of left base airspace opacity. Finding may represent combination of infection/inflammation versus atelectasis. Followup PA and lateral  Aortic Atherosclerosis   Currently  patient to be admitted as she meets SIRS criteria, ruling out sepsis. Initiated on antibiotics,   SIRS (systemic inflammatory response syndrome) (HCC) -Sepsis ruled out, SARS physiology improved - POA: SIRS criteria with tachycardia, tachypnea, leukocytosis Likely due to pneumonia Chest x-ray concerning for infiltrate, multifocal pneumonia -Lactic acid 1.7, Pro-Cal <0.10  -In ED started on IV antibiotics Rocephin/azithromycin-to p.o. antibiotics Levaquin -Blood/sputum cultures negative to date -Mucolytics -IV steroids-with quick taper>> switch to Medrol Dosepak   If cultures remain negative-switching to p.o. antibiotics in a.m. and planning for discharge   CAP (community acquired pneumonia) - Continue antibiotics of azithromycin/Rocephin -Will follow blood and sputum cultures-if cultures remain negative switching to p.o. antibiotics in a.m.   Anemia, normocytic normochromic -Iron deficiency anemia, continue iron supplements -Trend H&H closely    Latest Ref Rng & Units 12/11/2023    4:36 AM 12/10/2023    4:20 AM 12/09/2023    8:52 AM  CBC  WBC 4.0 - 10.5 K/uL 10.1  8.3  13.0   Hemoglobin 12.0 - 15.0 g/dL 9.1  9.1  27.2   Hematocrit 36.0 - 46.0 % 28.0  29.4  31.9   Platelets 150 - 400 K/uL 175  155  168            COPD (chronic obstructive pulmonary disease) (HCC) - Stable, satting > 92% >> now satting 99% on room air -On admission was satting 90% on 2 L -As needed DuoNeb bronchodilators   Gastroesophageal reflux disease - Continue PPI   Iron deficiency anemia - Continue iron supplements   Dysphagia - Monitoring oral intake -Aspiration precaution   History of TIA (transient ischemic attack) - Continue aspirin, continue statins   Generalized weakness - Consulting PT/OT for evaluation recommendations   Hypothyroidism - Continue  home medication of Synthroid   Essential hypertension - Currently stable, home medications reviewed, not on any BP meds -Utilizing  as needed hydralazine   Dyslipidemia - Continue statins       ------------------------------------------------------------------------------------------------------------------------------------------- Nutritional status:  The patient's BMI is: Body mass index is 19.62 kg/m. I agree with the assessment and plan as outlined  Skin Assessment: I have examined the patient's skin and I agree with the wound assessment as performed by wound care team --------------------------------------------------------------------------------------------------------------------------------------------- Cultures; Blood Cultures x 2 >> no growth to date      Disposition: Home health Diet recommendation:  Discharge Diet Orders (From admission, onward)     Start     Ordered   12/11/23 0000  Diet - low sodium heart healthy        12/11/23 0955           Regular diet DISCHARGE MEDICATION: Allergies as of 12/11/2023       Reactions   Celecoxib Shortness Of Breath   Dexlansoprazole Anaphylaxis, Other (See Comments)   abd pain   Famotidine Other (See Comments)   Makes her feel "very bad"        Medication List     STOP taking these medications    cetirizine 10 MG tablet Commonly known as: ZYRTEC       TAKE these medications    acetaminophen 325 MG tablet Commonly known as: TYLENOL Take 2 tablets (650 mg total) by mouth every 6 (six) hours as needed for mild pain (or Fever >/= 101).   albuterol 108 (90 Base) MCG/ACT inhaler Commonly known as: VENTOLIN HFA Inhale 1-2 puffs into the lungs every 6 (six) hours as needed for wheezing or shortness of breath.   aspirin EC 81 MG tablet Take 1 tablet (81 mg total) by mouth daily. Swallow whole.   Carbinoxamine Maleate 4 MG Tabs Take 1 tablet (4 mg total) by mouth at bedtime.   diazepam 5 MG tablet Commonly known as: VALIUM Take 5 mg by mouth daily as needed.   ferrous sulfate 325 (65 FE) MG tablet Take 325 mg by mouth. Takes  one on Monday, Wednesday and Friday   fluticasone 50 MCG/ACT nasal spray Commonly known as: FLONASE Place 2 sprays into both nostrils daily.   glycopyrrolate 1 MG tablet Commonly known as: ROBINUL Take 1 tablet (1 mg total) by mouth 2 (two) times daily for 20 days.   guaiFENesin-dextromethorphan 100-10 MG/5ML syrup Commonly known as: ROBITUSSIN DM Take 10 mLs by mouth every 8 (eight) hours.   ipratropium 0.06 % nasal spray Commonly known as: ATROVENT Place 1 spray into both nostrils 4 (four) times daily as needed for rhinitis.   levofloxacin 250 MG tablet Commonly known as: Levaquin Take 1 tablet (250 mg total) by mouth daily for 5 days.   levothyroxine 88 MCG tablet Commonly known as: SYNTHROID TAKE 1 TABLET DAILY ON MONDAY THROUGH SATURDAY AND ONE-HALF (1/2) TABLET ON SUNDAY What changed: See the new instructions.   methylPREDNISolone 4 MG Tbpk tablet Commonly known as: MEDROL DOSEPAK Medrol Dosepak take as instructed   montelukast 10 MG tablet Commonly known as: SINGULAIR Take 1 tablet (10 mg total) by mouth at bedtime.   pantoprazole 40 MG tablet Commonly known as: PROTONIX Take 1 tablet (40 mg total) by mouth 2 (two) times daily before a meal.   pravastatin 40 MG tablet Commonly known as: PRAVACHOL Take 2 tablets (80 mg total) by mouth daily.   senna 8.6 MG tablet Commonly known as: [employer]  Take 2 tablets by mouth at bedtime.        Discharge Exam: Filed Weights   12/09/23 0836 12/10/23 0523 12/11/23 0606  Weight: 46.3 kg 47.1 kg 47.9 kg        General:  AAO x 3,  cooperative, no distress; cachectic elderly female  HEENT:  Normocephalic, PERRL, otherwise with in Normal limits   Neuro:  CNII-XII intact. , normal motor and sensation, reflexes intact   Lungs:   Clear to auscultation BL, Respirations unlabored,  No wheezes / crackles  Cardio:    S1/S2, RRR, No murmure, No Rubs or Gallops   Abdomen:  Soft, non-tender, bowel sounds active all four  quadrants, no guarding or peritoneal signs.  Muscular  skeletal:  Limited exam -sever global generalized weaknesses - in bed, able to move all 4 extremities,   2+ pulses,  symmetric, No pitting edema  Skin:  Dry, warm to touch, negative for any Rashes,  Wounds: Please see nursing documentation             Condition at discharge: good  The results of significant diagnostics from this hospitalization (including imaging, microbiology, ancillary and laboratory) are listed below for reference.   Imaging Studies: CT Head Wo Contrast  Result Date: 12/09/2023 CLINICAL DATA:  Sudden onset severe headache. EXAM: CT HEAD WITHOUT CONTRAST TECHNIQUE: Contiguous axial images were obtained from the base of the skull through the vertex without intravenous contrast. RADIATION DOSE REDUCTION: This exam was performed according to the departmental dose-optimization program which includes automated exposure control, adjustment of the mA and/or kV according to patient size and/or use of iterative reconstruction technique. COMPARISON:  CT head dated December 16, 2021. FINDINGS: Brain: No evidence of acute infarction, hemorrhage, hydrocephalus, extra-axial collection or mass lesion/mass effect. Stable atrophy and chronic microvascular ischemic changes. Vascular: Calcified atherosclerosis at the skull base. No hyperdense vessel. Skull: Normal. Negative for fracture or focal lesion. Sinuses/Orbits: Scattered mild paranasal sinus mucosal thickening with small retention cysts. No air-fluid levels. The orbits are unremarkable. Other: None. IMPRESSION: 1. No acute intracranial abnormality. 2. Stable atrophy and chronic microvascular ischemic changes. Electronically Signed   By: Obie Dredge M.D.   On: 12/09/2023 10:25   DG Chest Portable 1 View  Result Date: 12/09/2023 CLINICAL DATA:  ?pna EXAM: PORTABLE CHEST 1 VIEW COMPARISON:  Chest x-ray 08/15/2023, CT chest 08/02/2012 FINDINGS: The heart and mediastinal  contours are unchanged. Aortic calcification. Hyperinflation of the lungs. Interval development of biapical airspace opacities overlying biapical pleural/pulmonary scarring. Interval development of left base airspace opacity. Coarsened interstitial markings and cystic changes with no overt pulmonary edema. No pleural effusion. No pneumothorax. No acute osseous abnormality. Total bilateral shoulder arthroplasty. . IMPRESSION: 1. Interval development of biapical airspace opacity overlying biapical pleural/pulmonary scarring. Interval development of left base airspace opacity. Finding may represent combination of infection/inflammation versus atelectasis. Followup PA and lateral chest X-ray is recommended in 3-4 weeks following therapy to ensure resolution. 2. Aortic Atherosclerosis (ICD10-I70.0) and Emphysema (ICD10-J43.9). Electronically Signed   By: Tish Frederickson M.D.   On: 12/09/2023 09:05   XR Knee 1-2 Views Left  Result Date: 12/03/2023 [facility] lateral left knee radiographs obtained and reviewed.  This shows well-positioned left total knee without loosening or subsidence. Impression: Unchanged left total knee arthroplasty radiographs without complications.  XR Knee 1-2 Views Right  Result Date: 12/03/2023 [facility] lateral right knee obtained and reviewed this shows joint space narrowing calcification medial meniscus with chondrocalcinosis and some meniscus extrusion.  Subchondral cyst formation  consistent with tricompartmental degenerative arthritis. Impression: Right knee osteoarthritis as described above with chondrocalcinosis.   Microbiology: Results for orders placed or performed during the hospital encounter of 12/09/23  Resp panel by RT-PCR (RSV, Flu A&B, Covid) Anterior Nasal Swab     Status: None   Collection Time: 12/09/23  9:16 AM   Specimen: Anterior Nasal Swab  Result Value Ref Range Status   SARS Coronavirus 2 by RT PCR NEGATIVE NEGATIVE Final    Comment: (NOTE) SARS-CoV-2 target nucleic  acids are NOT DETECTED.  The SARS-CoV-2 RNA is generally detectable in upper respiratory specimens during the acute phase of infection. The lowest concentration of SARS-CoV-2 viral copies this assay can detect is 138 copies/mL. A negative result does not preclude SARS-Cov-2 infection and should not be used as the sole basis for treatment or other patient management decisions. A negative result may occur with  improper specimen collection/handling, submission of specimen other than nasopharyngeal swab, presence of viral mutation(s) within the areas targeted by this assay, and inadequate number of viral copies(<138 copies/mL). A negative result must be combined with clinical observations, patient history, and epidemiological information. The expected result is Negative.  Fact Sheet for Patients:  BloggerCourse.com  Fact Sheet for Healthcare Providers:  SeriousBroker.it  This test is no t yet approved or cleared by the [place] FDA and  has been authorized for detection and/or diagnosis of SARS-CoV-2 by FDA under an Emergency Use Authorization (EUA). This EUA will remain  in effect (meaning this test can be used) for the duration of the COVID-19 declaration under Section 564(b)(1) of the Act, 21 U.S.C.section 360bbb-3(b)(1), unless the authorization is terminated  or revoked sooner.       Influenza A by PCR NEGATIVE NEGATIVE Final   Influenza B by PCR NEGATIVE NEGATIVE Final    Comment: (NOTE) The Xpert Xpress SARS-CoV-2/FLU/RSV plus assay is intended as an aid in the diagnosis of influenza from Nasopharyngeal swab specimens and should not be used as a sole basis for treatment. Nasal washings and aspirates are unacceptable for Xpert Xpress SARS-CoV-2/FLU/RSV testing.  Fact Sheet for Patients: BloggerCourse.com  Fact Sheet for Healthcare Providers: SeriousBroker.it  This  test is not yet approved or cleared by the [place] FDA and has been authorized for detection and/or diagnosis of SARS-CoV-2 by FDA under an Emergency Use Authorization (EUA). This EUA will remain in effect (meaning this test can be used) for the duration of the COVID-19 declaration under Section 564(b)(1) of the Act, 21 U.S.C. section 360bbb-3(b)(1), unless the authorization is terminated or revoked.     Resp Syncytial Virus by PCR NEGATIVE NEGATIVE Final    Comment: (NOTE) Fact Sheet for Patients: BloggerCourse.com  Fact Sheet for Healthcare Providers: SeriousBroker.it  This test is not yet approved or cleared by the [place] FDA and has been authorized for detection and/or diagnosis of SARS-CoV-2 by FDA under an Emergency Use Authorization (EUA). This EUA will remain in effect (meaning this test can be used) for the duration of the COVID-19 declaration under Section 564(b)(1) of the Act, 21 U.S.C. section 360bbb-3(b)(1), unless the authorization is terminated or revoked.  Performed at [facility], [address], [place], [place] [address]   Culture, blood (x 2)     Status: None (Preliminary result)   Collection Time: 12/09/23  1:57 PM   Specimen: Right Antecubital; Blood  Result Value Ref Range Status   Specimen Description RIGHT ANTECUBITAL  Final   Special Requests   Final    AEROBIC  BOTTLE ONLY Blood Culture results may not be optimal due to an inadequate volume of blood received in culture bottles   Culture   Final    NO GROWTH 2 DAYS Performed at [facility], [address] [place], [place] [address]    Report Status PENDING  Incomplete  Culture, blood (x 2)     Status: None (Preliminary result)   Collection Time: 12/09/23  2:14 PM   Specimen: BLOOD  Result Value Ref Range Status   Specimen Description BLOOD BLOOD RIGHT HAND  Final   Special Requests   Final    BOTTLES DRAWN AEROBIC AND ANAEROBIC Blood  Culture adequate volume   Culture   Final    NO GROWTH 2 DAYS Performed at [facility], [address], [place], [place] [address]    Report Status PENDING  Incomplete    Labs: CBC: Recent Labs  Lab 12/09/23 0852 12/10/23 0420 12/11/23 0436  WBC 13.0* 8.3 10.1  NEUTROABS 10.8*  --   --   HGB 10.2* 9.1* 9.1*  HCT 31.9* 29.4* 28.0*  MCV 92.5 92.7 90.9  PLT 168 155 175   Basic Metabolic Panel: Recent Labs  Lab 12/09/23 0852 12/09/23 1414 12/10/23 0420 12/11/23 0436  NA 141  --  138 138  K 3.6  --  3.6 3.5  CL 105  --  104 107  CO2 27  --  25 23  GLUCOSE 116*  --  108* 126*  BUN 25*  --  19 24*  CREATININE 0.91  --  0.84 0.83  CALCIUM 9.4  --  9.0 8.8*  MG  --  1.8  --   --   PHOS  --  3.4  --   --    Liver Function Tests: Recent Labs  Lab 12/09/23 0852  AST 21  ALT 13  ALKPHOS 62  BILITOT 0.5  PROT 7.4  ALBUMIN 3.5   CBG: Recent Labs  Lab 12/10/23 0757 12/11/23 0721  GLUCAP 121* 126*    Discharge time spent: greater than 45 minutes Signed: Kendell Bane, MD [facility] 12/11/2023

## 2023-12-11 NOTE — Plan of Care (Signed)
  Problem: Fluid Volume: Goal: Hemodynamic stability will improve Outcome: Progressing   Problem: Respiratory: Goal: Ability to maintain adequate ventilation will improve Outcome: Progressing   

## 2023-12-11 NOTE — Care Management Important Message (Signed)
Important Message  Patient Details  Name: Christine Leblanc MRN: [id number] Date of Birth: 12-03-1929   Important Message Given:  N/A - LOS <3 / Initial given by admissions     Corey Harold 12/11/2023, 10:36 AM

## 2023-12-12 ENCOUNTER — Telehealth: Payer: Self-pay

## 2023-12-12 NOTE — Transitions of Care (Post Inpatient/ED Visit) (Signed)
12/12/2023  Name: Christine Leblanc MRN: [id number] DOB: 03-15-1929  Today's TOC FU Call Status: Today's TOC FU Call Status:: Successful TOC FU Call Completed TOC FU Call Complete Date: 12/12/23 Patient's Name and Date of Birth confirmed.  Transition Care Management Follow-up Telephone Call Date of Discharge: 12/11/23 [facility] Type of Discharge: Inpatient Admission Primary Inpatient Discharge Diagnosis:: AspirationPneumonia;Systemic Inflammatory Response Syndrome How have you been since you were released from the hospital?: Better (Patient notes she is feeling a bit better but is very weak.) Any questions or concerns?: Yes Patient Questions/Concerns:: Patient has left arm pain from the blood pressure cuff being left on too long Patient Questions/Concerns Addressed: Other: (notified patient experience per son's request)  Items Reviewed: Did you receive and understand the discharge instructions provided?: Yes Medications obtained,verified, and reconciled?: Yes (Medications Reviewed) Any new allergies since your discharge?: No Dietary orders reviewed?: No Do you have support at home?: Yes People in Home: child(ren), adult Name of Support/Comfort Primary Source: Robert-son  Medications Reviewed Today: Medications Reviewed Today     Reviewed by Jodelle Gross, RN (Case Manager) on 12/12/23 at 1504  Med List Status: <None>   Medication Order Taking? Sig Documenting Provider Last Dose Status Informant  acetaminophen (TYLENOL) 325 MG tablet [id number] No Take 2 tablets (650 mg total) by mouth every 6 (six) hours as needed for mild pain (or Fever >/= 101).  Patient not taking: Reported on 10/31/2023   Shon Hale, MD Not Taking Active Self, Child, Pharmacy Records  albuterol (VENTOLIN HFA) 108 (90 Base) MCG/ACT inhaler [id number] Yes Inhale 1-2 puffs into the lungs every 6 (six) hours as needed for wheezing or shortness of breath. Birder Robson, MD Taking  Active Self, Child, Pharmacy Records  aspirin EC 81 MG tablet [id number] Yes Take 1 tablet (81 mg total) by mouth daily. Swallow whole. Fransico Michael, Cadence H, PA-C Taking Active Self, Child, Pharmacy Records  Carbinoxamine Maleate 4 MG TABS [id number]  Take 1 tablet (4 mg total) by mouth at bedtime. Alfonse Spruce, MD  Expired 12/09/23 2359 Self, Pharmacy Records, Child  diazepam (VALIUM) 5 MG tablet [id number] Yes Take 5 mg by mouth daily as needed. [provider] Taking Active Self, Pharmacy Records, Child  ferrous sulfate 325 (65 FE) MG tablet [id number] Yes Take 325 mg by mouth. Takes one on Monday, Wednesday and Friday [provider] Taking Active Self, Pharmacy Records, Child  fluticasone [facility]) 50 MCG/ACT nasal spray [id number] Yes Place 2 sprays into both nostrils daily. Birder Robson, MD Taking Active Self, Child, Pharmacy Records  glycopyrrolate (ROBINUL) 1 MG tablet [id number] Yes Take 1 tablet (1 mg total) by mouth 2 (two) times daily for 20 days. Kendell Bane, MD Taking Active   guaiFENesin-dextromethorphan [facility] DM) 100-10 MG/5ML syrup [id number] Yes Take 10 mLs by mouth every 8 (eight) hours. Kendell Bane, MD Taking Active   ipratropium (ATROVENT) 0.06 % nasal spray [id number] Yes Place 1 spray into both nostrils 4 (four) times daily as needed for rhinitis. Birder Robson, MD Taking Active Self, Child, Pharmacy Records  levofloxacin [facility]) 250 MG tablet [id number] Yes Take 1 tablet (250 mg total) by mouth daily for 5 days. Kendell Bane, MD Taking Active   levothyroxine (SYNTHROID) 88 MCG tablet [id number] Yes TAKE 1 TABLET DAILY ON MONDAY THROUGH SATURDAY AND ONE-HALF (1/2) TABLET ON SUNDAY  Patient taking differently: Take 88 mcg by mouth See admin instructions. TAKE 1 TABLET DAILY ON MONDAY THROUGH SATURDAY AND  ONE-HALF (1/2) TABLET ON Delsa Grana, MD Taking Active Self, Pharmacy Records, Child  methylPREDNISolone (MEDROL DOSEPAK) 4  MG TBPK tablet [id number] Yes Medrol Dosepak take as instructed Shahmehdi, Gemma Payor, MD Taking Active   montelukast (SINGULAIR) 10 MG tablet [id number] Yes Take 1 tablet (10 mg total) by mouth at bedtime. Parrett, Virgel Bouquet, NP Taking Active Self, Child, Pharmacy Records  pantoprazole (PROTONIX) 40 MG tablet [id number] Yes Take 1 tablet (40 mg total) by mouth 2 (two) times daily before a meal. Letta Median, PA-C Taking Active Self, Child, Pharmacy Records  pravastatin (PRAVACHOL) 40 MG tablet [id number] Yes Take 2 tablets (80 mg total) by mouth daily. Antoine Poche, MD Taking Active Self, Child, Pharmacy Records  senna [facility]) 8.6 MG tablet [id number] Yes Take 2 tablets by mouth at bedtime. [provider] Taking Active Self, Pharmacy Records, Child            Home Care and Equipment/Supplies: Were Home Health Services Ordered?: Yes Name of Home Health Agency:: [facility] Has Agency set up a time to come to your home?: No EMR reviewed for Home Health Orders:  (Patient shared she will call if she does not hear today) Any new equipment or medical supplies ordered?: No  Functional Questionnaire: Do you need assistance with bathing/showering or dressing?: Yes Do you need assistance with meal preparation?: Yes Do you need assistance with eating?: No Do you have difficulty maintaining continence: No Do you need assistance with getting out of bed/getting out of a chair/moving?: Yes Do you have difficulty managing or taking your medications?: Yes (Son assists patient with medications)  Follow up appointments reviewed: PCP Follow-up appointment confirmed?: Yes Date of PCP follow-up appointment?: 12/26/23 Follow-up Provider: Dr. Catalina Pizza Specialist [facility] Follow-up appointment confirmed?: NA Do you need transportation to your follow-up appointment?: No Do you understand care options if your condition(s) worsen?: Yes-patient verbalized understanding  SDOH Interventions Today     Flowsheet Row Most Recent Value  SDOH Interventions   Food Insecurity Interventions Intervention Not Indicated  Housing Interventions Intervention Not Indicated  Transportation Interventions Intervention Not Indicated  Utilities Interventions Intervention Not Indicated      Jodelle Gross RN, BSN, CCM RN Care Manager  Transitions of Care  [facility]  [phone]

## 2023-12-14 ENCOUNTER — Ambulatory Visit
Admission: EM | Admit: 2023-12-14 | Discharge: 2023-12-14 | Disposition: A | Discharge status: Home / Self Care | Payer: Medicare Other | Attending: Family Medicine | Admitting: Family Medicine

## 2023-12-14 DIAGNOSIS — R350 Frequency of micturition: Secondary | ICD-10-CM

## 2023-12-14 LAB — POCT URINALYSIS DIP (MANUAL ENTRY)
Bilirubin, UA: NEGATIVE
Glucose, UA: NEGATIVE mg/dL
Ketones, POC UA: NEGATIVE mg/dL
Leukocytes, UA: NEGATIVE
Nitrite, UA: NEGATIVE
Protein Ur, POC: 30 mg/dL — AB
Spec Grav, UA: 1.025 (ref 1.010–1.025)
Urobilinogen, UA: 0.2 U/dL
pH, UA: 7 (ref 5.0–8.0)

## 2023-12-14 LAB — CULTURE, BLOOD (ROUTINE X 2)
Culture: NO GROWTH
Special Requests: ADEQUATE

## 2023-12-14 NOTE — ED Triage Notes (Signed)
Pt states urinary incontinence and frequency for the past 4 days since she was discharged from the hospital.

## 2023-12-14 NOTE — Discharge Instructions (Signed)
Your urinalysis does not show urinary tract infection today and your vital signs are very reassuring.  Follow-up with your primary care provider Monday both for a hospital follow-up as well as to discuss your diuretic concerns.

## 2023-12-16 DIAGNOSIS — J189 Pneumonia, unspecified organism: Secondary | ICD-10-CM | POA: Diagnosis not present

## 2023-12-16 DIAGNOSIS — Z79899 Other long term (current) drug therapy: Secondary | ICD-10-CM | POA: Diagnosis not present

## 2023-12-16 DIAGNOSIS — M19041 Primary osteoarthritis, right hand: Secondary | ICD-10-CM | POA: Diagnosis not present

## 2023-12-16 DIAGNOSIS — E785 Hyperlipidemia, unspecified: Secondary | ICD-10-CM | POA: Diagnosis not present

## 2023-12-16 DIAGNOSIS — Z8673 Personal history of transient ischemic attack (TIA), and cerebral infarction without residual deficits: Secondary | ICD-10-CM | POA: Diagnosis not present

## 2023-12-16 DIAGNOSIS — Z7982 Long term (current) use of aspirin: Secondary | ICD-10-CM | POA: Diagnosis not present

## 2023-12-16 DIAGNOSIS — J440 Chronic obstructive pulmonary disease with acute lower respiratory infection: Secondary | ICD-10-CM | POA: Diagnosis not present

## 2023-12-16 DIAGNOSIS — D509 Iron deficiency anemia, unspecified: Secondary | ICD-10-CM | POA: Diagnosis not present

## 2023-12-16 DIAGNOSIS — M19012 Primary osteoarthritis, left shoulder: Secondary | ICD-10-CM | POA: Diagnosis not present

## 2023-12-16 DIAGNOSIS — I10 Essential (primary) hypertension: Secondary | ICD-10-CM | POA: Diagnosis not present

## 2023-12-16 DIAGNOSIS — E042 Nontoxic multinodular goiter: Secondary | ICD-10-CM | POA: Diagnosis not present

## 2023-12-16 DIAGNOSIS — M47812 Spondylosis without myelopathy or radiculopathy, cervical region: Secondary | ICD-10-CM | POA: Diagnosis not present

## 2023-12-16 DIAGNOSIS — K219 Gastro-esophageal reflux disease without esophagitis: Secondary | ICD-10-CM | POA: Diagnosis not present

## 2023-12-16 DIAGNOSIS — Z96611 Presence of right artificial shoulder joint: Secondary | ICD-10-CM | POA: Diagnosis not present

## 2023-12-16 DIAGNOSIS — Z96653 Presence of artificial knee joint, bilateral: Secondary | ICD-10-CM | POA: Diagnosis not present

## 2023-12-16 DIAGNOSIS — Z8701 Personal history of pneumonia (recurrent): Secondary | ICD-10-CM | POA: Diagnosis not present

## 2023-12-16 DIAGNOSIS — I700 Atherosclerosis of aorta: Secondary | ICD-10-CM | POA: Diagnosis not present

## 2023-12-16 DIAGNOSIS — Z87891 Personal history of nicotine dependence: Secondary | ICD-10-CM | POA: Diagnosis not present

## 2023-12-16 DIAGNOSIS — Z85828 Personal history of other malignant neoplasm of skin: Secondary | ICD-10-CM | POA: Diagnosis not present

## 2023-12-16 DIAGNOSIS — J439 Emphysema, unspecified: Secondary | ICD-10-CM | POA: Diagnosis not present

## 2023-12-16 DIAGNOSIS — G319 Degenerative disease of nervous system, unspecified: Secondary | ICD-10-CM | POA: Diagnosis not present

## 2023-12-16 DIAGNOSIS — E039 Hypothyroidism, unspecified: Secondary | ICD-10-CM | POA: Diagnosis not present

## 2023-12-16 DIAGNOSIS — M19042 Primary osteoarthritis, left hand: Secondary | ICD-10-CM | POA: Diagnosis not present

## 2023-12-16 DIAGNOSIS — R1310 Dysphagia, unspecified: Secondary | ICD-10-CM | POA: Diagnosis not present

## 2023-12-16 DIAGNOSIS — Z9181 History of falling: Secondary | ICD-10-CM | POA: Diagnosis not present

## 2023-12-18 LAB — CULTURE, BLOOD (ROUTINE X 2): Culture  Setup Time: NO GROWTH

## 2023-12-18 NOTE — ED Provider Notes (Signed)
[facility]    CSN: [id number] Arrival date & time: 12/14/23  1404      History   Chief Complaint Chief Complaint  Patient presents with   Urinary Frequency    HPI Christine Leblanc is a 89 y.o. female.   Presenting today with several day history of urinary incontinence and frequency. Denies fever, chills, abdominal pain, flank pain, hematuria. Thinks this is related to her diuretic and wanting to stop or change the medication. Recently discharged from the hospital with pneumonia, on levaquin.     Past Medical History:  Diagnosis Date   Anemia    Asthma    Baker's cyst    Cancer (HCC)    skin Ca- ? basal cell    Chronic back pain    Chronic neck pain    Degenerative joint disease    Left shoulder; cervical spine, knees & hands    Gastroesophageal reflux disease    Hiatal hernia; distal esophageal web requiring dilatation; gastric polyps; gastritis; refuses colonoscopy   GERD (gastroesophageal reflux disease)    History of stress test 1990's   stress test done under the care of Dr. Dietrich Pates & Dr. Allyson Sabal, now being followed by Dr. Wyline Mood- in [place] , recently seen & told to f/U in one yr.    Hyperlipidemia 04/27/2011   Hypertension    Hypothyroidism    Lymphocytic thyroiditis    Mild carotid artery disease (HCC)    Multiple thyroid nodules 2010   Adenomatous; thyroidectomy in 2010   Orthostasis    Osteoarthritis of right knee 07/27/2014   Pneumonia    hosp. for pneumonia- long time ago    Primary localized osteoarthrosis of right shoulder 06/21/2015   Seizures (HCC)    yes- as a child- & into adult years, states she took med. for them at one time, stopped at 32 yrs. of age    Syncope and collapse 2007   Possible CVA in 2012 with left lower extremity weakness; refused hospitalization; CT-Atrophy and chronic microvascular ischemic change.     Patient Active Problem List   Diagnosis Date Noted   SIRS (systemic inflammatory response syndrome) (HCC)  12/09/2023   CAP (community acquired pneumonia) 12/09/2023   Chronic cough 10/03/2023   Dysphagia 08/15/2023   Protein-calorie malnutrition, mild (HCC) 06/22/2022   Acute bronchitis 06/22/2022   Iron deficiency anemia 06/22/2022   COPD (chronic obstructive pulmonary disease) (HCC) 06/22/2022   Loss of weight 10/09/2021   Pharyngeal dysphagia 10/09/2021   Mild carotid artery disease (HCC) 05/19/2020   History of TIA (transient ischemic attack) 12/25/2018   Generalized weakness 09/30/2018   MGUS (monoclonal gammopathy of unknown significance) 10/21/2017   Primary localized osteoarthrosis of right shoulder 06/21/2015   S/P shoulder replacement 06/21/2015   Oropharyngeal dysphagia 11/29/2014   Osteoarthritis of right knee 07/27/2014   Postsurgical hypothyroidism 04/22/2014   Hypothyroidism 10/12/2013   Syncope and collapse    Anemia, normocytic normochromic 04/26/2012   Gastroesophageal reflux disease    Essential hypertension 12/07/2011   Leg pain, bilateral 04/27/2011   Dyslipidemia 04/27/2011   Other dysphagia 06/07/2010    Past Surgical History:  Procedure Laterality Date   ABDOMINAL HYSTERECTOMY     fibroids   CATARACT EXTRACTION     Bilateral; redo surgery on the right for incomplete primary procedure   COLONOSCOPY  Remote   ESOPHAGOGASTRODUODENOSCOPY  11/2009   Dr. Darrick Penna: probable distal web s/p dilation small hiatal hernia/gastric polyps/mild gastritis, appeared to have narrowing at the junction of  D1 and D2 dilated up to 12 mm   ESOPHAGOGASTRODUODENOSCOPY N/A 03/21/2015   Surgeon: West Bali, MD; proximal esophageal web s/p dilation, multiple gastric polyps s/p multiple biopsies, mild erosive gastritis s/p biopsy.  Pathology with fundic gland polyps, chronic gastritis, negative for H. pylori.   EYE SURGERY     PROLAPSED UTERINE FIBROID LIGATION     outcomed with rectocele & cystocele   SAVORY DILATION N/A 03/21/2015   Procedure: SAVORY DILATION;  Surgeon: West Bali, MD;  Location: [facility];  Service: Endoscopy;  Laterality: N/A;   SHOULDER SURGERY     left   TOTAL KNEE ARTHROPLASTY Right 07/27/2014   Procedure: RIGHT TOTAL KNEE ARTHROPLASTY;  Surgeon: Eulas Post, MD;  Location: [facility] OR;  Service: Orthopedics;  Laterality: Right;   TOTAL SHOULDER ARTHROPLASTY Right 06/21/2015   Procedure: RIGHT TOTAL SHOULDER ARTHROPLASTY;  Surgeon: Teryl Lucy, MD;  Location: [facility] OR;  Service: Orthopedics;  Laterality: Right;   TOTAL THYROIDECTOMY  2010    OB History     Gravida      Para      Term      Preterm      AB      Living  4      SAB      IAB      Ectopic      Multiple      Live Births               Home Medications    Prior to Admission medications   Medication Sig Start Date End Date Taking? Authorizing Provider  acetaminophen (TYLENOL) 325 MG tablet Take 2 tablets (650 mg total) by mouth every 6 (six) hours as needed for mild pain (or Fever >/= 101). Patient not taking: Reported on 10/31/2023 08/15/23   Shon Hale, MD  albuterol (VENTOLIN HFA) 108 (90 Base) MCG/ACT inhaler Inhale 1-2 puffs into the lungs every 6 (six) hours as needed for wheezing or shortness of breath. 12/02/23   Birder Robson, MD  aspirin EC 81 MG tablet Take 1 tablet (81 mg total) by mouth daily. Swallow whole. 10/31/23   Furth, Cadence H, PA-C  Carbinoxamine Maleate 4 MG TABS Take 1 tablet (4 mg total) by mouth at bedtime. 01/02/23 12/09/23  Alfonse Spruce, MD  diazepam (VALIUM) 5 MG tablet Take 5 mg by mouth daily as needed. 05/29/23   [provider]  ferrous sulfate 325 (65 FE) MG tablet Take 325 mg by mouth. Takes one on Monday, Wednesday and Friday    [provider]  fluticasone (FLONASE) 50 MCG/ACT nasal spray Place 2 sprays into both nostrils daily. 12/02/23   Birder Robson, MD  glycopyrrolate (ROBINUL) 1 MG tablet Take 1 tablet (1 mg total) by mouth 2 (two) times daily for 20 days. 12/11/23 12/31/23   Shahmehdi, Gemma Payor, MD  guaiFENesin-dextromethorphan (ROBITUSSIN DM) 100-10 MG/5ML syrup Take 10 mLs by mouth every 8 (eight) hours. 12/11/23   Shahmehdi, Gemma Payor, MD  ipratropium (ATROVENT) 0.06 % nasal spray Place 1 spray into both nostrils 4 (four) times daily as needed for rhinitis. 12/02/23   Birder Robson, MD  levothyroxine (SYNTHROID) 88 MCG tablet TAKE 1 TABLET DAILY ON MONDAY THROUGH SATURDAY AND ONE-HALF (1/2) TABLET ON SUNDAY Patient taking differently: Take 88 mcg by mouth See admin instructions. TAKE 1 TABLET DAILY ON MONDAY THROUGH SATURDAY AND ONE-HALF (1/2) TABLET ON SUNDAY 10/10/22   Reather Littler, MD  methylPREDNISolone (MEDROL  DOSEPAK) 4 MG TBPK tablet Medrol Dosepak take as instructed 12/11/23   Shahmehdi, Guadalupe Maple A, MD  montelukast (SINGULAIR) 10 MG tablet Take 1 tablet (10 mg total) by mouth at bedtime. 10/03/23   Parrett, Virgel Bouquet, NP  pantoprazole (PROTONIX) 40 MG tablet Take 1 tablet (40 mg total) by mouth 2 (two) times daily before a meal. 11/06/23   Letta Median, PA-C  pravastatin (PRAVACHOL) 40 MG tablet Take 2 tablets (80 mg total) by mouth daily. 11/06/23   Antoine Poche, MD  senna (SENOKOT) 8.6 MG tablet Take 2 tablets by mouth at bedtime.    [provider]    Family History Family History  Problem Relation Age of Onset   Heart disease Mother    Gallbladder disease Mother    Heart failure Mother    Diabetes Mother    Breast cancer Sister        30 years ago   Diabetes Sister    Heart attack Father    Diabetes Maternal Grandmother    Colon cancer Neg Hx    Stroke Neg Hx     Social History Social History   Tobacco Use   Smoking status: Former    Current packs/day: 0.00    Average packs/day: 0.8 packs/day for 20.0 years (16.0 ttl pk-yrs)    Types: Cigarettes    Start date: 12/31/1958    Quit date: 12/31/1978    Years since quitting: 44.9   Smokeless tobacco: Never  Vaping Use   Vaping status: Never Used  Substance Use Topics   Alcohol  use: Not Currently    Comment: rarely   Drug use: No     Allergies   Celecoxib, Dexlansoprazole, and Famotidine   Review of Systems Review of Systems PER HPI  Physical Exam Triage Vital Signs ED Triage Vitals  Encounter Vitals Group     BP 12/14/23 1507 (!) 169/67     Systolic BP Percentile --      Diastolic BP Percentile --      Pulse Rate 12/14/23 1507 98     Resp 12/14/23 1507 16     Temp 12/14/23 1507 98.1 F (36.7 C)     Temp Source 12/14/23 1447 Oral     SpO2 12/14/23 1507 96 %     Weight --      Height --      Head Circumference --      Peak Flow --      Pain Score 12/14/23 1508 0     Pain Loc --      Pain Education --      Exclude from Growth Chart --    No data found.  Updated Vital Signs BP (!) 169/67 (BP Location: Right Arm)   Pulse 98   Temp 98.1 F (36.7 C) (Oral)   Resp 16   SpO2 96%   Visual Acuity Right Eye Distance:   Left Eye Distance:   Bilateral Distance:    Right Eye Near:   Left Eye Near:    Bilateral Near:     Physical Exam Vitals and nursing note reviewed.  Constitutional:      Appearance: Normal appearance. She is not ill-appearing.  HENT:     Head: Atraumatic.  Eyes:     Extraocular Movements: Extraocular movements intact.     Conjunctiva/sclera: Conjunctivae normal.  Cardiovascular:     Rate and Rhythm: Normal rate.     Heart sounds: Normal heart sounds.  Pulmonary:  Effort: Pulmonary effort is normal.     Breath sounds: Normal breath sounds.  Abdominal:     General: Bowel sounds are normal. There is no distension.     Palpations: Abdomen is soft.     Tenderness: There is no abdominal tenderness. There is no guarding.  Musculoskeletal:        General: Normal range of motion.     Cervical back: Normal range of motion and neck supple.  Skin:    General: Skin is warm and dry.  Neurological:     Mental Status: She is alert and oriented to person, place, and time.  Psychiatric:        Mood and Affect: Mood  normal.        Thought Content: Thought content normal.        Judgment: Judgment normal.      UC Treatments / Results  Labs (all labs ordered are listed, but only abnormal results are displayed) Labs Reviewed  POCT URINALYSIS DIP (MANUAL ENTRY) - Abnormal; Notable for the following components:      Result Value   Blood, UA small (*)    Protein Ur, POC =30 (*)    All other components within normal limits    EKG   Radiology No results found.  Procedures Procedures (including critical care time)  Medications Ordered in UC Medications - No data to display  Initial Impression / Assessment and Plan / UC Course  I have reviewed the triage vital signs and the nursing notes.  Pertinent labs & imaging results that were available during my care of the patient were reviewed by me and considered in my medical decision making (see chart for details).     Urinalysis with no evidence of UTI, do suspect sxs related to medications but discussed to follow up with PCP for further discussion regarding modifying her diuretics as this is a chronic medication Follow up for worsening sxs.  Final Clinical Impressions(s) / UC Diagnoses   Final diagnoses:  Urinary frequency     Discharge Instructions      Your urinalysis does not show urinary tract infection today and your vital signs are very reassuring.  Follow-up with your primary care provider Monday both for a hospital follow-up as well as to discuss your diuretic concerns.    ED Prescriptions   None    PDMP not reviewed this encounter.   Particia Nearing, [place] 12/18/23 2144

## 2023-12-23 DIAGNOSIS — J189 Pneumonia, unspecified organism: Secondary | ICD-10-CM | POA: Diagnosis not present

## 2023-12-23 DIAGNOSIS — I10 Essential (primary) hypertension: Secondary | ICD-10-CM | POA: Diagnosis not present

## 2023-12-23 DIAGNOSIS — J439 Emphysema, unspecified: Secondary | ICD-10-CM | POA: Diagnosis not present

## 2023-12-23 DIAGNOSIS — J440 Chronic obstructive pulmonary disease with acute lower respiratory infection: Secondary | ICD-10-CM | POA: Diagnosis not present

## 2023-12-23 DIAGNOSIS — I700 Atherosclerosis of aorta: Secondary | ICD-10-CM | POA: Diagnosis not present

## 2023-12-23 DIAGNOSIS — D509 Iron deficiency anemia, unspecified: Secondary | ICD-10-CM | POA: Diagnosis not present

## 2023-12-26 DIAGNOSIS — Z8701 Personal history of pneumonia (recurrent): Secondary | ICD-10-CM | POA: Diagnosis not present

## 2023-12-26 DIAGNOSIS — R1310 Dysphagia, unspecified: Secondary | ICD-10-CM | POA: Diagnosis not present

## 2023-12-26 DIAGNOSIS — Z7689 Persons encountering health services in other specified circumstances: Secondary | ICD-10-CM | POA: Diagnosis not present

## 2023-12-27 DIAGNOSIS — J189 Pneumonia, unspecified organism: Secondary | ICD-10-CM | POA: Diagnosis not present

## 2023-12-27 DIAGNOSIS — D509 Iron deficiency anemia, unspecified: Secondary | ICD-10-CM | POA: Diagnosis not present

## 2023-12-27 DIAGNOSIS — I700 Atherosclerosis of aorta: Secondary | ICD-10-CM | POA: Diagnosis not present

## 2023-12-27 DIAGNOSIS — J439 Emphysema, unspecified: Secondary | ICD-10-CM | POA: Diagnosis not present

## 2023-12-27 DIAGNOSIS — J440 Chronic obstructive pulmonary disease with acute lower respiratory infection: Secondary | ICD-10-CM | POA: Diagnosis not present

## 2023-12-27 DIAGNOSIS — I10 Essential (primary) hypertension: Secondary | ICD-10-CM | POA: Diagnosis not present

## 2023-12-31 DIAGNOSIS — J440 Chronic obstructive pulmonary disease with acute lower respiratory infection: Secondary | ICD-10-CM | POA: Diagnosis not present

## 2023-12-31 DIAGNOSIS — I700 Atherosclerosis of aorta: Secondary | ICD-10-CM | POA: Diagnosis not present

## 2023-12-31 DIAGNOSIS — J439 Emphysema, unspecified: Secondary | ICD-10-CM | POA: Diagnosis not present

## 2023-12-31 DIAGNOSIS — D509 Iron deficiency anemia, unspecified: Secondary | ICD-10-CM | POA: Diagnosis not present

## 2023-12-31 DIAGNOSIS — I10 Essential (primary) hypertension: Secondary | ICD-10-CM | POA: Diagnosis not present

## 2023-12-31 DIAGNOSIS — J189 Pneumonia, unspecified organism: Secondary | ICD-10-CM | POA: Diagnosis not present

## 2024-01-02 DIAGNOSIS — J439 Emphysema, unspecified: Secondary | ICD-10-CM | POA: Diagnosis not present

## 2024-01-02 DIAGNOSIS — I10 Essential (primary) hypertension: Secondary | ICD-10-CM | POA: Diagnosis not present

## 2024-01-02 DIAGNOSIS — J440 Chronic obstructive pulmonary disease with acute lower respiratory infection: Secondary | ICD-10-CM | POA: Diagnosis not present

## 2024-01-02 DIAGNOSIS — D509 Iron deficiency anemia, unspecified: Secondary | ICD-10-CM | POA: Diagnosis not present

## 2024-01-02 DIAGNOSIS — J189 Pneumonia, unspecified organism: Secondary | ICD-10-CM | POA: Diagnosis not present

## 2024-01-02 DIAGNOSIS — I700 Atherosclerosis of aorta: Secondary | ICD-10-CM | POA: Diagnosis not present

## 2024-01-06 ENCOUNTER — Observation Stay (HOSPITAL_COMMUNITY)
Admission: EM | Admit: 2024-01-06 | Discharge: 2024-01-07 | Disposition: A | Discharge status: Home / Self Care | Payer: Medicare Other | Attending: Internal Medicine | Admitting: Internal Medicine

## 2024-01-06 ENCOUNTER — Encounter (HOSPITAL_COMMUNITY): Payer: Self-pay

## 2024-01-06 ENCOUNTER — Encounter: Payer: Self-pay | Admitting: Hematology

## 2024-01-06 ENCOUNTER — Other Ambulatory Visit: Payer: Self-pay

## 2024-01-06 ENCOUNTER — Emergency Department (HOSPITAL_COMMUNITY): Payer: Medicare Other

## 2024-01-06 ENCOUNTER — Other Ambulatory Visit: Payer: Self-pay | Admitting: Student

## 2024-01-06 DIAGNOSIS — E039 Hypothyroidism, unspecified: Secondary | ICD-10-CM | POA: Insufficient documentation

## 2024-01-06 DIAGNOSIS — Z79899 Other long term (current) drug therapy: Secondary | ICD-10-CM | POA: Diagnosis not present

## 2024-01-06 DIAGNOSIS — Z96611 Presence of right artificial shoulder joint: Secondary | ICD-10-CM | POA: Diagnosis not present

## 2024-01-06 DIAGNOSIS — G4489 Other headache syndrome: Secondary | ICD-10-CM | POA: Diagnosis not present

## 2024-01-06 DIAGNOSIS — J449 Chronic obstructive pulmonary disease, unspecified: Secondary | ICD-10-CM | POA: Diagnosis not present

## 2024-01-06 DIAGNOSIS — D472 Monoclonal gammopathy: Secondary | ICD-10-CM | POA: Diagnosis not present

## 2024-01-06 DIAGNOSIS — Z96651 Presence of right artificial knee joint: Secondary | ICD-10-CM | POA: Diagnosis not present

## 2024-01-06 DIAGNOSIS — G459 Transient cerebral ischemic attack, unspecified: Secondary | ICD-10-CM | POA: Diagnosis not present

## 2024-01-06 DIAGNOSIS — Z8673 Personal history of transient ischemic attack (TIA), and cerebral infarction without residual deficits: Secondary | ICD-10-CM | POA: Diagnosis not present

## 2024-01-06 DIAGNOSIS — Z7982 Long term (current) use of aspirin: Secondary | ICD-10-CM | POA: Diagnosis not present

## 2024-01-06 DIAGNOSIS — R410 Disorientation, unspecified: Secondary | ICD-10-CM | POA: Diagnosis not present

## 2024-01-06 DIAGNOSIS — Z87891 Personal history of nicotine dependence: Secondary | ICD-10-CM | POA: Diagnosis not present

## 2024-01-06 DIAGNOSIS — I10 Essential (primary) hypertension: Secondary | ICD-10-CM | POA: Insufficient documentation

## 2024-01-06 DIAGNOSIS — R4182 Altered mental status, unspecified: Secondary | ICD-10-CM | POA: Diagnosis not present

## 2024-01-06 DIAGNOSIS — R519 Headache, unspecified: Secondary | ICD-10-CM | POA: Diagnosis not present

## 2024-01-06 DIAGNOSIS — R9431 Abnormal electrocardiogram [ECG] [EKG]: Secondary | ICD-10-CM | POA: Diagnosis not present

## 2024-01-06 DIAGNOSIS — Z96612 Presence of left artificial shoulder joint: Secondary | ICD-10-CM | POA: Diagnosis not present

## 2024-01-06 DIAGNOSIS — R479 Unspecified speech disturbances: Secondary | ICD-10-CM | POA: Diagnosis present

## 2024-01-06 DIAGNOSIS — Z85828 Personal history of other malignant neoplasm of skin: Secondary | ICD-10-CM | POA: Diagnosis not present

## 2024-01-06 DIAGNOSIS — E785 Hyperlipidemia, unspecified: Secondary | ICD-10-CM | POA: Diagnosis not present

## 2024-01-06 DIAGNOSIS — J439 Emphysema, unspecified: Secondary | ICD-10-CM

## 2024-01-06 DIAGNOSIS — I4510 Unspecified right bundle-branch block: Secondary | ICD-10-CM | POA: Diagnosis not present

## 2024-01-06 DIAGNOSIS — I6782 Cerebral ischemia: Secondary | ICD-10-CM | POA: Diagnosis not present

## 2024-01-06 LAB — COMPREHENSIVE METABOLIC PANEL
ALT: 13 U/L (ref 0–44)
AST: 19 U/L (ref 15–41)
Albumin: 3.5 g/dL (ref 3.5–5.0)
Alkaline Phosphatase: 68 U/L (ref 38–126)
Anion gap: 7 (ref 5–15)
BUN: 19 mg/dL (ref 8–23)
CO2: 29 mmol/L (ref 22–32)
Calcium: 8.9 mg/dL (ref 8.9–10.3)
Chloride: 103 mmol/L (ref 98–111)
Creatinine, Ser: 0.97 mg/dL (ref 0.44–1.00)
GFR, Estimated: 55 mL/min — ABNORMAL LOW (ref >60)
Glucose, Bld: 99 mg/dL (ref 70–99)
Potassium: 3.4 mmol/L — ABNORMAL LOW (ref 3.5–5.1)
Sodium: 139 mmol/L (ref 135–145)
Total Bilirubin: 0.3 mg/dL (ref 0.0–1.2)
Total Protein: 7 g/dL (ref 6.5–8.1)

## 2024-01-06 LAB — CBC WITH DIFFERENTIAL/PLATELET
Abs Immature Granulocytes: 0.01 10*3/uL (ref 0.00–0.07)
Basophils Absolute: 0 10*3/uL (ref 0.0–0.1)
Basophils Relative: 1 %
Eosinophils Absolute: 0.4 10*3/uL (ref 0.0–0.5)
Eosinophils Relative: 8 %
HCT: 34.5 % — ABNORMAL LOW (ref 36.0–46.0)
Hemoglobin: 11 g/dL — ABNORMAL LOW (ref 12.0–15.0)
Immature Granulocytes: 0 %
Lymphocytes Relative: 22 %
Lymphs Abs: 1.2 10*3/uL (ref 0.7–4.0)
MCH: 29.6 pg (ref 26.0–34.0)
MCHC: 31.9 g/dL (ref 30.0–36.0)
MCV: 93 fL (ref 80.0–100.0)
Monocytes Absolute: 0.6 10*3/uL (ref 0.1–1.0)
Monocytes Relative: 10 %
Neutro Abs: 3.4 10*3/uL (ref 1.7–7.7)
Neutrophils Relative %: 59 %
Platelets: 162 10*3/uL (ref 150–400)
RBC: 3.71 MIL/uL — ABNORMAL LOW (ref 3.87–5.11)
RDW: 14.4 % (ref 11.5–15.5)
WBC: 5.7 10*3/uL (ref 4.0–10.5)
nRBC: 0 % (ref 0.0–0.2)

## 2024-01-06 LAB — URINALYSIS, ROUTINE W REFLEX MICROSCOPIC
Bilirubin Urine: NEGATIVE
Glucose, UA: NEGATIVE mg/dL
Hgb urine dipstick: NEGATIVE
Ketones, ur: NEGATIVE mg/dL
Leukocytes,Ua: NEGATIVE
Nitrite: NEGATIVE
Protein, ur: NEGATIVE mg/dL
Specific Gravity, Urine: 1.013 (ref 1.005–1.030)
pH: 6 (ref 5.0–8.0)

## 2024-01-06 LAB — PROTIME-INR
INR: 1.1 (ref 0.8–1.2)
Prothrombin Time: 14.2 s (ref 11.4–15.2)

## 2024-01-06 MED ORDER — ASPIRIN 325 MG PO TABS
325.0000 mg | ORAL_TABLET | Freq: Once | ORAL | Status: AC
  Administered 2024-01-06: 325 mg via ORAL
  Filled 2024-01-06: qty 1

## 2024-01-06 MED ORDER — ACETAMINOPHEN 325 MG PO TABS
650.0000 mg | ORAL_TABLET | Freq: Once | ORAL | Status: AC
  Administered 2024-01-06: 650 mg via ORAL
  Filled 2024-01-06: qty 2

## 2024-01-06 NOTE — ED Notes (Signed)
 Assisted pt to bedside commode. Pt is back in bed with call light in reach and now resting.

## 2024-01-06 NOTE — ED Provider Notes (Signed)
 [place] EMERGENCY DEPARTMENT AT [facility] Provider Note   CSN: [id number] Arrival date & time: 01/06/24  1240     History  Chief Complaint  Patient presents with   Altered Mental Status    Pt bib EMS from home for confusion that started around 1145,  pt AAOx4, pt states that she couldn't remember if she took her meds this morning, confused to the point where she couldn't dial a number or think straight. Endorses headaches and confusions afterwards. Hx of TIA    JARIAH TARKOWSKI is a 89 y.o. female.   Altered Mental Status Presenting symptoms: confusion   Associated symptoms: headaches   Associated symptoms: no abdominal pain, no nausea, no vomiting and no weakness        SHAKEYLA GIEBLER is a 89 y.o. female past medical history of hypertension, GERD, anemia, coronary artery disease, brought in by EMS from home for confusion.  Last known well was 11 AM this morning, around 1145 patient attempted to call her son but instead called her daughter daughter states that she was talking on the phone and speaking gibberish and seemed very confused.  EMS was contacted per family members patient still somewhat confused when EMS arrived.  Alert and oriented upon ER arrival.  Patient states she cannot remember if she took her morning medications.  History of previous TIA.  Takes aspirin  daily but no blood thinner.  Family at bedside states she is baseline at this time.  Patient endorses slight improving frontal headache.  Denies any other symptoms.    Home Medications Prior to Admission medications   Medication Sig Start Date End Date Taking? Authorizing Provider  acetaminophen  (TYLENOL ) 325 MG tablet Take 2 tablets (650 mg total) by mouth every 6 (six) hours as needed for mild pain (or Fever >/= 101). Patient not taking: Reported on 10/31/2023 08/15/23   Pearlean Manus, MD  albuterol  (VENTOLIN  HFA) 108 (90 Base) MCG/ACT inhaler Inhale 1-2 puffs into the lungs every 6 (six) hours as  needed for wheezing or shortness of breath. 12/02/23   Tobie Arleta SQUIBB, MD  aspirin  EC 81 MG tablet Take 1 tablet (81 mg total) by mouth daily. Swallow whole. 10/31/23   Furth, Cadence H, PA-C  Carbinoxamine  Maleate 4 MG TABS Take 1 tablet (4 mg total) by mouth at bedtime. 01/02/23 12/09/23  Iva Marty Saltness, MD  diazepam  (VALIUM ) 5 MG tablet Take 5 mg by mouth daily as needed. 05/29/23   [provider]  ferrous sulfate  325 (65 FE) MG tablet Take 325 mg by mouth. Takes one on Monday, Wednesday and Friday    [provider]  fluticasone  (FLONASE ) 50 MCG/ACT nasal spray Place 2 sprays into both nostrils daily. 12/02/23   Tobie Arleta SQUIBB, MD  glycopyrrolate  (ROBINUL ) 1 MG tablet Take 1 tablet (1 mg total) by mouth 2 (two) times daily for 20 days. 12/11/23 12/31/23  ShahmehdiAdriana LABOR, MD  guaiFENesin -dextromethorphan  (ROBITUSSIN DM) 100-10 MG/5ML syrup Take 10 mLs by mouth every 8 (eight) hours. 12/11/23   Shahmehdi, Seyed A, MD  ipratropium (ATROVENT ) 0.06 % nasal spray Place 1 spray into both nostrils 4 (four) times daily as needed for rhinitis. 12/02/23   Tobie Arleta SQUIBB, MD  levothyroxine  (SYNTHROID ) 88 MCG tablet TAKE 1 TABLET DAILY ON MONDAY THROUGH SATURDAY AND ONE-HALF (1/2) TABLET ON SUNDAY Patient taking differently: Take 88 mcg by mouth See admin instructions. TAKE 1 TABLET DAILY ON MONDAY THROUGH SATURDAY AND ONE-HALF (1/2) TABLET ON SUNDAY 10/10/22  Von [place], MD  methylPREDNISolone  (MEDROL  DOSEPAK) 4 MG TBPK tablet Medrol  Dosepak take as instructed 12/11/23   Willette Adriana LABOR, MD  montelukast  (SINGULAIR ) 10 MG tablet Take 1 tablet (10 mg total) by mouth at bedtime. 10/03/23   Parrett, Madelin RAMAN, NP  pantoprazole  (PROTONIX ) 40 MG tablet Take 1 tablet (40 mg total) by mouth 2 (two) times daily before a meal. 11/06/23   Rudy Josette RAMAN, PA-C  pravastatin  (PRAVACHOL ) 40 MG tablet TAKE 2 TABLETS DAILY 01/06/24   Branch, Jonathan F, MD  senna (SENOKOT) 8.6 MG tablet Take 2 tablets  by mouth at bedtime.    [provider]      Allergies    Celecoxib, Dexlansoprazole , and Famotidine    Review of Systems   Review of Systems  Constitutional:  Negative for chills.  Eyes:  Negative for pain and visual disturbance.  Respiratory:  Negative for shortness of breath.   Cardiovascular:  Negative for chest pain.  Gastrointestinal:  Negative for abdominal pain, nausea and vomiting.  Genitourinary:  Negative for difficulty urinating and dysuria.  Musculoskeletal:  Negative for arthralgias.  Neurological:  Positive for headaches. Negative for dizziness, syncope, facial asymmetry, speech difficulty, weakness and numbness.  Psychiatric/Behavioral:  Positive for confusion.     Physical Exam Updated Vital Signs BP (!) 183/89   Pulse 76   Temp 98.4 F (36.9 C)   Resp 20   Ht 5' 1 (1.549 m)   Wt 47.9 kg   SpO2 96%   BMI 19.95 kg/m  Physical Exam Vitals and nursing note reviewed.  Constitutional:      General: She is not in acute distress.    Appearance: Normal appearance. She is not toxic-appearing.  HENT:     Mouth/Throat:     Mouth: Mucous membranes are moist.  Eyes:     Extraocular Movements: Extraocular movements intact.     Conjunctiva/sclera: Conjunctivae normal.     Pupils: Pupils are equal, round, and reactive to light.  Cardiovascular:     Rate and Rhythm: Normal rate.     Pulses: Normal pulses.  Pulmonary:     Effort: Pulmonary effort is normal.  Abdominal:     Palpations: Abdomen is soft.     Tenderness: There is no abdominal tenderness.  Musculoskeletal:        General: No swelling. Normal range of motion.     Cervical back: Normal range of motion. No tenderness.     Right lower leg: No edema.     Left lower leg: No edema.  Skin:    General: Skin is warm.     Capillary Refill: Capillary refill takes less than 2 seconds.     Findings: No rash.  Neurological:     General: No focal deficit present.     Mental Status: She is alert and  oriented to person, place, and time.     GCS: GCS eye subscore is 4. GCS verbal subscore is 5. GCS motor subscore is 6.     Sensory: Sensation is intact.     Motor: Motor function is intact. No weakness.     Coordination: Coordination is intact.     Comments: CN II-XII intact., speech clear, mentating well. No facial droop, no pronator drift.       ED Results / Procedures / Treatments   Labs (all labs ordered are listed, but only abnormal results are displayed) Labs Reviewed  CBC WITH DIFFERENTIAL/PLATELET - Abnormal; Notable for the following components:  Result Value   RBC 3.71 (*)    Hemoglobin 11.0 (*)    HCT 34.5 (*)    All other components within normal limits  COMPREHENSIVE METABOLIC PANEL - Abnormal; Notable for the following components:   Potassium 3.4 (*)    GFR, Estimated 55 (*)    All other components within normal limits  URINALYSIS, ROUTINE W REFLEX MICROSCOPIC  PROTIME-INR    EKG None  Radiology MR BRAIN WO CONTRAST Result Date: 01/06/2024 CLINICAL DATA:  Transient ischemic attack.  Confusion.  Headache. EXAM: MRI HEAD WITHOUT CONTRAST TECHNIQUE: Multiplanar, multiecho pulse sequences of the brain and surrounding structures were obtained without intravenous contrast. COMPARISON:  Head CT 12/09/2023.  MRI 07/09/2016. FINDINGS: Brain: Diffusion imaging does not show any acute or subacute infarction or other cause of restricted diffusion. Chronic small-vessel ischemic changes are present throughout the pons. No focal cerebellar finding. Cerebral hemispheres show an old lacunar infarction in the right basal ganglia/external capsule. Chronic small-vessel ischemic changes are present within the thalami, basal ganglia and throughout the cerebral hemispheric deep white matter. No large vessel territory stroke. No mass lesion, recent hemorrhage, hydrocephalus or extra-axial collection. Some hemosiderin deposition associated with the small vessel insults in the right basal  ganglia region. Vascular: Major vessels at the base of the brain show flow. Skull and upper cervical spine: Negative Sinuses/Orbits: Mild mucosal thickening. No advanced sinusitis. Orbits negative. Other: None IMPRESSION: No acute or reversible finding. Chronic small-vessel ischemic changes throughout the pons, thalami, basal ganglia and cerebral hemispheric deep white matter. Old lacunar infarction in the right basal ganglia/external capsule. Electronically Signed   By: Oneil Officer M.D.   On: 01/06/2024 16:47   DG Chest Portable 1 View Result Date: 01/06/2024 CLINICAL DATA:  Altered mental status. EXAM: PORTABLE CHEST 1 VIEW COMPARISON:  12/09/2023 FINDINGS: The heart size and mediastinal contours are within normal limits. Pulmonary hyperinflation again seen, consistent with COPD. Both lungs are clear. Bilateral shoulder prostheses and superior mediastinal surgical clips again noted. IMPRESSION: COPD. No active disease. Electronically Signed   By: Norleen DELENA Kil M.D.   On: 01/06/2024 14:57    Procedures Procedures    Medications Ordered in ED Medications - No data to display  ED Course/ Medical Decision Making/ A&P                                 Medical Decision Making Patient here from home for evaluation of brief episode of confusion, last known well at 11 AM this morning.  1145 family members who are at bedside state she was talking on the telephone and speaking gibberish seemed very confused history of TIA, takes aspirin  only.  Seems to be back to neurologic baseline per family on arrival to ER.  Suspect TIA, infectious process also considered.  No focal neurodeficits on my exam.  Patient seems to be mentating well.  Mildly hypertensive vital signs otherwise reassuring.    Amount and/or Complexity of Data Reviewed Labs: ordered.    Details: Labs interpreted by me no evidence of leukocytosis, INR 1.1.  Chemistries without significant derangement, urinalysis without evidence of  infection Radiology: ordered.    Details: Chest x-ray shows likely COPD  MRI brain no acute or reversible finding, chronic small vessel ischemic changes ECG/medicine tests: ordered.    Details: EKG shows sinus rhythm atrial premature complexes and right atrial enlargement right bundle branch block Discussion of management or test interpretation with external  provider(s):  Patient with episode of slurred speech and confusion earlier today.  Suspected TIA.  Discussed with neurology, plan for admission further TIA workup possible neurology consult tomorrow for repeat MRI and TTE  Discussed with [facility] hospitalist who agrees to admit    Risk OTC drugs. Decision regarding hospitalization.           Final Clinical Impression(s) / ED Diagnoses Final diagnoses:  TIA (transient ischemic attack)    Rx / DC Orders ED Discharge Orders     None         Herlinda Madelin RIGGERS 01/06/24 1906    Albertina Dixon, MD 01/06/24 2158

## 2024-01-06 NOTE — Assessment & Plan Note (Signed)
 Follows with oncology

## 2024-01-06 NOTE — H&P (Signed)
 History and Physical    Christine Leblanc FMW:[id number] DOB: 15-Apr-1929 DOA: 01/06/2024  PCP: Shona Norleen PEDLAR, MD   Patient coming from: Home  I have personally briefly reviewed patient's old medical records in [facility]  Chief Complaint: Confused speech  HPI: Christine Leblanc is a 89 y.o. female with medical history significant for COPD, hypertension, MGUS, seizures. Patient was brought to the ED via EMS reports of confusion.  At the time of my evaluation patient's is back to baseline, and remembers the events of today, and her son- [name] is at bedside. At about 11:45 AM, patient was alone, when confusion started.  She called her [name], thinking she was talking to her son, patient speech was confused, but they do not think it was garbled or slurred.  EMS was subsequently called, patient was still confused on EMS arrival, but on arrival to the ED, patient was back to baseline.  No facial asymmetry, no weakness of extremities noted.  She otherwise denies any other symptoms. Prior history of stroke/TIA 2011.  ED Course: Blood pressure 170s to 180s.  Temperature 98.4, heart rate 70s.  Respirate rate 17-26.  UA chest x-ray negative for acute abnormality. MRI brain shows old lacunar infarct. EDP talked to Dr. Merrianne, recommended admission for TIA, and to consider repeat MRI tomorrow.    Review of Systems: As per HPI all other systems reviewed and negative.  Past Medical History:  Diagnosis Date   Anemia    Asthma    Baker's cyst    Cancer (HCC)    skin Ca- ? basal cell    Chronic back pain    Chronic neck pain    Degenerative joint disease    Left shoulder; cervical spine, knees & hands    Gastroesophageal reflux disease    Hiatal hernia; distal esophageal web requiring dilatation; gastric polyps; gastritis; refuses colonoscopy   GERD (gastroesophageal reflux disease)    History of stress test 1990's   stress test done under the care of Dr. Juventino & Dr. Court, now being  followed by Dr. Alvan- in [facility] [place] , recently seen & told to f/U in one yr.    Hyperlipidemia 04/27/2011   Hypertension    Hypothyroidism    Lymphocytic thyroiditis    Mild carotid artery disease (HCC)    Multiple thyroid  nodules 2010   Adenomatous; thyroidectomy in 2010   Orthostasis    Osteoarthritis of right knee 07/27/2014   Pneumonia    hosp. for pneumonia- long time ago    Primary localized osteoarthrosis of right shoulder 06/21/2015   Seizures (HCC)    yes- as a child- & into adult years, states she took med. for them at one time, stopped at 32 yrs. of age    Syncope and collapse 2007   Possible CVA in 2012 with left lower extremity weakness; refused hospitalization; CT-Atrophy and chronic microvascular ischemic change.     Past Surgical History:  Procedure Laterality Date   ABDOMINAL HYSTERECTOMY     fibroids   CATARACT EXTRACTION     Bilateral; redo surgery on the right for incomplete primary procedure   COLONOSCOPY  Remote   ESOPHAGOGASTRODUODENOSCOPY  11/2009   Dr. Harvey: probable distal web s/p dilation small hiatal hernia/gastric polyps/mild gastritis, appeared to have narrowing at the junction of D1 and D2 dilated up to 12 mm   ESOPHAGOGASTRODUODENOSCOPY N/A 03/21/2015   Surgeon: Margo LITTIE Harvey, MD; proximal esophageal web s/p dilation, multiple gastric polyps s/p multiple biopsies, mild  erosive gastritis s/p biopsy.  Pathology with fundic gland polyps, chronic gastritis, negative for H. pylori.   EYE SURGERY     PROLAPSED UTERINE FIBROID LIGATION     outcomed with rectocele & cystocele   SAVORY DILATION N/A 03/21/2015   Procedure: SAVORY DILATION;  Surgeon: Margo LITTIE Haddock, MD;  Location: [facility];  Service: Endoscopy;  Laterality: N/A;   SHOULDER SURGERY     left   TOTAL KNEE ARTHROPLASTY Right 07/27/2014   Procedure: RIGHT TOTAL KNEE ARTHROPLASTY;  Surgeon: Fonda SHAUNNA Olmsted, MD;  Location: [facility] OR;  Service: Orthopedics;  Laterality: Right;   TOTAL  SHOULDER ARTHROPLASTY Right 06/21/2015   Procedure: RIGHT TOTAL SHOULDER ARTHROPLASTY;  Surgeon: Fonda Olmsted, MD;  Location: [facility] OR;  Service: Orthopedics;  Laterality: Right;   TOTAL THYROIDECTOMY  2010     reports that she quit smoking about 45 years ago. Her smoking use included cigarettes. She started smoking about 65 years ago. She has a 16 pack-year smoking history. She has never used smokeless tobacco. She reports that she does not currently use alcohol . She reports that she does not use drugs.  Allergies  Allergen Reactions   Celecoxib Shortness Of Breath   Dexlansoprazole  Anaphylaxis and Other (See Comments)    abd pain   Famotidine Other (See Comments)    Makes her feel very bad    Family History  Problem Relation Age of Onset   Heart disease Mother    Gallbladder disease Mother    Heart failure Mother    Diabetes Mother    Breast cancer Sister        30 years ago   Diabetes Sister    Heart attack Father    Diabetes Maternal Grandmother    Colon cancer Neg Hx    Stroke Neg Hx    Prior to Admission medications   Medication Sig Start Date End Date Taking? Authorizing Provider  aspirin  EC 81 MG tablet Take 1 tablet (81 mg total) by mouth daily. Swallow whole. 10/31/23  Yes Furth, Cadence H, PA-C  Carbinoxamine  Maleate 4 MG TABS Take 1 tablet (4 mg total) by mouth at bedtime. 01/02/23 01/06/24 Yes Iva Marty Saltness, MD  diazepam  (VALIUM ) 5 MG tablet Take 2.5 mg by mouth daily as needed for anxiety. 05/29/23  Yes [provider]  ferrous sulfate  325 (65 FE) MG tablet Take 325 mg by mouth. Takes one on Monday, Wednesday and Friday   Yes [provider]  fluticasone  (FLONASE ) 50 MCG/ACT nasal spray Place 2 sprays into both nostrils daily. 12/02/23  Yes Patel, Arleta SHAUNNA, MD  ipratropium (ATROVENT ) 0.06 % nasal spray Place 1 spray into both nostrils 4 (four) times daily as needed for rhinitis. 12/02/23  Yes Patel, Arleta SHAUNNA, MD  levothyroxine  (SYNTHROID ) 88 MCG  tablet TAKE 1 TABLET DAILY ON MONDAY THROUGH SATURDAY AND ONE-HALF (1/2) TABLET ON SUNDAY Patient taking differently: Take 88 mcg by mouth See admin instructions. TAKE 1 TABLET DAILY ON MONDAY THROUGH SATURDAY AND ONE-HALF (1/2) TABLET ON SUNDAY 10/10/22  Yes Von Pacific, MD  pantoprazole  (PROTONIX ) 40 MG tablet Take 1 tablet (40 mg total) by mouth 2 (two) times daily before a meal. 11/06/23  Yes Rudy Josette RAMAN, PA-C  pravastatin  (PRAVACHOL ) 40 MG tablet TAKE 2 TABLETS DAILY 01/06/24  Yes Branch, Jonathan F, MD  senna (SENOKOT) 8.6 MG tablet Take 2 tablets by mouth at bedtime.   Yes [provider]    Physical Exam: Vitals:   01/06/24 1315 01/06/24  1330 01/06/24 1345 01/06/24 1626  BP: (!) 175/84 (!) 183/89 (!) 177/89 (!) 183/101  Pulse: 75 76 76 75  Resp: (!) 26 20 20 17   Temp:      SpO2: 95% 96% 93% 96%  Weight:      Height:        Constitutional: NAD, calm, comfortable Vitals:   01/06/24 1315 01/06/24 1330 01/06/24 1345 01/06/24 1626  BP: (!) 175/84 (!) 183/89 (!) 177/89 (!) 183/101  Pulse: 75 76 76 75  Resp: (!) 26 20 20 17   Temp:      SpO2: 95% 96% 93% 96%  Weight:      Height:       Eyes: PERRL, lids and conjunctivae normal ENMT: Mucous membranes are moist.   Neck: normal, supple, no masses, no thyromegaly Respiratory: clear to auscultation bilaterally, no wheezing, no crackles. Normal respiratory effort. No accessory muscle use.  Cardiovascular: Regular rate and rhythm, no murmurs / rubs / gallops. No extremity edema.  Extremities warm.   Abdomen: no tenderness, no masses palpated. No hepatosplenomegaly. Bowel sounds positive.  Musculoskeletal: no clubbing / cyanosis. No joint deformity upper and lower extremities. Good ROM, no contractures. Normal muscle tone.  Skin: some chronic stasis changes to bilateral lower extremity, no rashes, lesions, ulcers. No induration Neurologic: No Facial asymmetry, speech fluent, 5 of 5 strength bilateral upper and lower  extremities, sensation intact.  Patient having ice cream without any swallowing difficulty. Psychiatric: Normal judgment and insight. Alert and oriented x 3. Normal mood.   Labs on Admission: I have personally reviewed following labs and imaging studies  CBC: Recent Labs  Lab 01/06/24 1405  WBC 5.7  NEUTROABS 3.4  HGB 11.0*  HCT 34.5*  MCV 93.0  PLT 162   Basic Metabolic Panel: Recent Labs  Lab 01/06/24 1405  NA 139  K 3.4*  CL 103  CO2 29  GLUCOSE 99  BUN 19  CREATININE 0.97  CALCIUM  8.9   GFR: Estimated Creatinine Clearance: 27.9 mL/min (by C-G formula based on SCr of 0.97 mg/dL). Liver Function Tests: Recent Labs  Lab 01/06/24 1405  AST 19  ALT 13  ALKPHOS 68  BILITOT 0.3  PROT 7.0  ALBUMIN 3.5   No results for input(s): LIPASE, AMYLASE in the last 168 hours. No results for input(s): AMMONIA in the last 168 hours. Coagulation Profile: Recent Labs  Lab 01/06/24 1405  INR 1.1   Urine analysis:    Component Value Date/Time   COLORURINE YELLOW 01/06/2024 1627   APPEARANCEUR CLEAR 01/06/2024 1627   LABSPEC 1.013 01/06/2024 1627   PHURINE 6.0 01/06/2024 1627   GLUCOSEU NEGATIVE 01/06/2024 1627   HGBUR NEGATIVE 01/06/2024 1627   BILIRUBINUR NEGATIVE 01/06/2024 1627   BILIRUBINUR negative 12/14/2023 1454   KETONESUR NEGATIVE 01/06/2024 1627   PROTEINUR NEGATIVE 01/06/2024 1627   UROBILINOGEN 0.2 12/14/2023 1454   UROBILINOGEN 0.2 08/02/2012 1603   NITRITE NEGATIVE 01/06/2024 1627   LEUKOCYTESUR NEGATIVE 01/06/2024 1627    Radiological Exams on Admission: MR BRAIN WO CONTRAST Result Date: 01/06/2024 CLINICAL DATA:  Transient ischemic attack.  Confusion.  Headache. EXAM: MRI HEAD WITHOUT CONTRAST TECHNIQUE: Multiplanar, multiecho pulse sequences of the brain and surrounding structures were obtained without intravenous contrast. COMPARISON:  Head CT 12/09/2023.  MRI 07/09/2016. FINDINGS: Brain: Diffusion imaging does not show any acute or subacute  infarction or other cause of restricted diffusion. Chronic small-vessel ischemic changes are present throughout the pons. No focal cerebellar finding. Cerebral hemispheres show an old lacunar infarction  in the right basal ganglia/external capsule. Chronic small-vessel ischemic changes are present within the thalami, basal ganglia and throughout the cerebral hemispheric deep white matter. No large vessel territory stroke. No mass lesion, recent hemorrhage, hydrocephalus or extra-axial collection. Some hemosiderin deposition associated with the small vessel insults in the right basal ganglia region. Vascular: Major vessels at the base of the brain show flow. Skull and upper cervical spine: Negative Sinuses/Orbits: Mild mucosal thickening. No advanced sinusitis. Orbits negative. Other: None IMPRESSION: No acute or reversible finding. Chronic small-vessel ischemic changes throughout the pons, thalami, basal ganglia and cerebral hemispheric deep white matter. Old lacunar infarction in the right basal ganglia/external capsule. Electronically Signed   By: Oneil Officer M.D.   On: 01/06/2024 16:47   DG Chest Portable 1 View Result Date: 01/06/2024 CLINICAL DATA:  Altered mental status. EXAM: PORTABLE CHEST 1 VIEW COMPARISON:  12/09/2023 FINDINGS: The heart size and mediastinal contours are within normal limits. Pulmonary hyperinflation again seen, consistent with COPD. Both lungs are clear. Bilateral shoulder prostheses and superior mediastinal surgical clips again noted. IMPRESSION: COPD. No active disease. Electronically Signed   By: Norleen DELENA Kil M.D.   On: 01/06/2024 14:57   EKG: Independently reviewed.  Sinus rhythm, rate 76, QTc 459, no significant change from prior.  Assessment/Plan Principal Problem:   TIA (transient ischemic attack) Active Problems:   COPD (chronic obstructive pulmonary disease) (HCC)   Dyslipidemia   Essential hypertension   MGUS (monoclonal gammopathy of unknown  significance)  Assessment and Plan: * TIA (transient ischemic attack) Transient episode of altered mental status with confused speech.  Back to baseline on arrival to the ED.  No other focal neurologic deficits.  UA chest x-ray not suggestive of infectious etiology.  Afebrile without leukocytosis.  MRI brain-no acute abnormality, chronic small vessel ischemic changes, old lacunar infarct in right basal ganglia/external capsule. - EDP talked to Dr. Merrianne, admit for TIA, consider repeat MRI, B12, folate, TSH, aspirin  for now no Plavix  -Inpatient neurology consult -Echocardiogram -Carotid Dopplers -Lipid panel hemoglobin A1c -Aspirin  325 X 1, resume home 81mg  daily  -Resume home pravastatin  80 mg daily  COPD (chronic obstructive pulmonary disease) (HCC) Stable.  Resume home regimen  MGUS (monoclonal gammopathy of unknown significance) Follows with oncology.  Essential hypertension Elevated.  Not on antihypertensives. -As needed labetalol  5mg  for systolic greater than 180    DVT prophylaxis: Lovenox  Code Status: FULL code- Confirmed with patient and son at bedside Family Communication: Son Rob at bedside Disposition Plan: ~ 2 days Consults called: Neurology Admission status:  Obs Tele    Author: Tully FORBES Carwin, MD 01/06/2024 10:20 PM  For on call review [email or web address].

## 2024-01-06 NOTE — Assessment & Plan Note (Addendum)
 Transient episode of altered mental status with confused speech.  Back to baseline on arrival to the ED.  No other focal neurologic deficits.  UA chest x-ray not suggestive of infectious etiology.  Afebrile without leukocytosis.  MRI brain-no acute abnormality, chronic small vessel ischemic changes, old lacunar infarct in right basal ganglia/external capsule. - EDP talked to Dr. Merrianne, admit for TIA, consider repeat MRI, B12, folate, TSH, aspirin  for now no Plavix  -Inpatient neurology consult -Echocardiogram -Carotid Dopplers -Lipid panel hemoglobin A1c -Aspirin  325 X 1, resume home 81mg  daily  -Resume home pravastatin  80 mg daily

## 2024-01-06 NOTE — Assessment & Plan Note (Signed)
 Elevated.  Not on antihypertensives. -As needed labetalol 5mg  for systolic greater than 180

## 2024-01-06 NOTE — ED Triage Notes (Signed)
 Pt bib EMS from home for confusion that started around 1145,  pt AAOx4, pt states that she couldn't remember if she took her meds this morning, confused to the point where she couldn't dial a number or think straight. Endorses headaches and confusions afterwards. Hx of TIA

## 2024-01-06 NOTE — Assessment & Plan Note (Signed)
Stable.  Resume home regimen 

## 2024-01-06 NOTE — ED Triage Notes (Signed)
 Pt bib EMS from home for confusion that started around 1145,  pt AAOx4, pt states that she couldn't remember if she took her med this morning. Endorses headaches and confusions afterwards. Hx of TIA

## 2024-01-07 ENCOUNTER — Observation Stay (HOSPITAL_COMMUNITY): Payer: Medicare Other

## 2024-01-07 ENCOUNTER — Encounter: Payer: Self-pay | Admitting: Hematology

## 2024-01-07 ENCOUNTER — Observation Stay (HOSPITAL_BASED_OUTPATIENT_CLINIC_OR_DEPARTMENT_OTHER): Payer: Medicare Other

## 2024-01-07 DIAGNOSIS — I6621 Occlusion and stenosis of right posterior cerebral artery: Secondary | ICD-10-CM | POA: Diagnosis not present

## 2024-01-07 DIAGNOSIS — G459 Transient cerebral ischemic attack, unspecified: Secondary | ICD-10-CM | POA: Diagnosis not present

## 2024-01-07 DIAGNOSIS — I6523 Occlusion and stenosis of bilateral carotid arteries: Secondary | ICD-10-CM | POA: Diagnosis not present

## 2024-01-07 DIAGNOSIS — I669 Occlusion and stenosis of unspecified cerebral artery: Secondary | ICD-10-CM | POA: Diagnosis not present

## 2024-01-07 LAB — TSH: TSH: 12.082 u[IU]/mL — ABNORMAL HIGH (ref 0.350–4.500)

## 2024-01-07 LAB — ECHOCARDIOGRAM COMPLETE
AR max vel: 3.21 cm2
AV Area VTI: 2.94 cm2
AV Area mean vel: 2.43 cm2
AV Mean grad: 1 mm[Hg]
AV Peak grad: 2.7 mm[Hg]
Ao pk vel: 0.83 m/s
Area-P 1/2: 2.49 cm2
Calc EF: 57.8 %
Height: 61 in
MV VTI: 2.46 cm2
S' Lateral: 2.8 cm
Single Plane A2C EF: 65.3 %
Single Plane A4C EF: 51.7 %
Weight: 1689.61 [oz_av]

## 2024-01-07 LAB — FOLATE: Folate: 14.7 ng/mL (ref >5.9)

## 2024-01-07 LAB — LIPID PANEL
Cholesterol: 154 mg/dL (ref 0–200)
HDL: 57 mg/dL (ref >40)
LDL Cholesterol: 87 mg/dL (ref 0–99)
Total CHOL/HDL Ratio: 2.7 {ratio}
Triglycerides: 51 mg/dL (ref <150)
VLDL: 10 mg/dL (ref 0–40)

## 2024-01-07 LAB — VITAMIN B12: Vitamin B-12: 453 pg/mL (ref 180–914)

## 2024-01-07 LAB — T4, FREE: Free T4: 1.2 ng/dL — ABNORMAL HIGH (ref 0.61–1.12)

## 2024-01-07 MED ORDER — ASPIRIN 81 MG PO TBEC: 81.0000 mg | DELAYED_RELEASE_TABLET | Freq: Every day | ORAL | 0 refills | Status: AC

## 2024-01-07 MED ORDER — ATORVASTATIN CALCIUM 40 MG PO TABS: 40.0000 mg | ORAL_TABLET | Freq: Every day | ORAL | Status: DC

## 2024-01-07 MED ORDER — LEVOTHYROXINE SODIUM 88 MCG PO TABS: 88.0000 ug | ORAL_TABLET | ORAL | Status: DC

## 2024-01-07 MED ORDER — LEVOTHYROXINE SODIUM 88 MCG PO TABS: 44.0000 ug | ORAL_TABLET | ORAL | Status: DC

## 2024-01-07 MED ORDER — ACETAMINOPHEN 160 MG/5ML PO SOLN: 650.0000 mg | ORAL | Status: DC | PRN

## 2024-01-07 MED ORDER — ATORVASTATIN CALCIUM 40 MG PO TABS: 40.0000 mg | ORAL_TABLET | Freq: Every day | ORAL | 3 refills | Status: DC

## 2024-01-07 MED ORDER — IPRATROPIUM BROMIDE 0.06 % NA SOLN: 1.0000 | Freq: Four times a day (QID) | NASAL | Status: DC | PRN

## 2024-01-07 MED ORDER — LABETALOL HCL 5 MG/ML IV SOLN
5.0000 mg | INTRAVENOUS | Status: DC | PRN
  Administered 2024-01-07: 5 mg via INTRAVENOUS
  Filled 2024-01-07: qty 4

## 2024-01-07 MED ORDER — SENNA 8.6 MG PO TABS: 17.2000 mg | ORAL_TABLET | Freq: Every day | ORAL | Status: DC

## 2024-01-07 MED ORDER — ADULT MULTIVITAMIN W/MINERALS CH
1.0000 | ORAL_TABLET | Freq: Every day | ORAL | Status: DC
  Administered 2024-01-07: 1 via ORAL
  Filled 2024-01-07: qty 1

## 2024-01-07 MED ORDER — ENSURE ENLIVE PO LIQD: 237.0000 mL | Freq: Two times a day (BID) | ORAL | Status: DC

## 2024-01-07 MED ORDER — CLOPIDOGREL BISULFATE 75 MG PO TABS: 75.0000 mg | ORAL_TABLET | Freq: Every day | ORAL | 3 refills | Status: DC

## 2024-01-07 MED ORDER — PRAVASTATIN SODIUM 40 MG PO TABS
80.0000 mg | ORAL_TABLET | Freq: Every day | ORAL | Status: DC
  Administered 2024-01-07: 80 mg via ORAL
  Filled 2024-01-07: qty 2

## 2024-01-07 MED ORDER — ASPIRIN 81 MG PO TBEC: 81.0000 mg | DELAYED_RELEASE_TABLET | Freq: Every day | ORAL | Status: DC

## 2024-01-07 MED ORDER — ACETAMINOPHEN 650 MG RE SUPP
650.0000 mg | RECTAL | Status: DC | PRN
Start: 2024-01-07 — End: 2024-01-07

## 2024-01-07 MED ORDER — ENOXAPARIN SODIUM 30 MG/0.3ML IJ SOSY: 30.0000 mg | PREFILLED_SYRINGE | INTRAMUSCULAR | Status: DC

## 2024-01-07 MED ORDER — LEVOTHYROXINE SODIUM 88 MCG PO TABS
88.0000 ug | ORAL_TABLET | ORAL | Status: DC
  Administered 2024-01-07: 88 ug via ORAL
  Filled 2024-01-07: qty 1

## 2024-01-07 MED ORDER — ACETAMINOPHEN 325 MG PO TABS: 650.0000 mg | ORAL_TABLET | ORAL | Status: DC | PRN

## 2024-01-07 MED ORDER — POLYETHYLENE GLYCOL 3350 17 G PO PACK: 17.0000 g | PACK | Freq: Once | ORAL | Status: DC

## 2024-01-07 MED ORDER — PANTOPRAZOLE SODIUM 40 MG PO TBEC
40.0000 mg | DELAYED_RELEASE_TABLET | Freq: Two times a day (BID) | ORAL | Status: DC
  Administered 2024-01-07 (×2): 40 mg via ORAL
  Filled 2024-01-07 (×2): qty 1

## 2024-01-07 MED ORDER — ENOXAPARIN SODIUM 40 MG/0.4ML IJ SOSY
40.0000 mg | PREFILLED_SYRINGE | INTRAMUSCULAR | Status: DC
  Administered 2024-01-07: 40 mg via SUBCUTANEOUS
  Filled 2024-01-07: qty 0.4

## 2024-01-07 MED ORDER — STROKE: EARLY STAGES OF RECOVERY BOOK
Freq: Once | Status: AC
  Filled 2024-01-07: qty 1

## 2024-01-07 MED ORDER — CLOPIDOGREL BISULFATE 75 MG PO TABS
75.0000 mg | ORAL_TABLET | Freq: Every day | ORAL | Status: DC
  Administered 2024-01-07: 75 mg via ORAL
  Filled 2024-01-07: qty 1

## 2024-01-07 MED ORDER — ASPIRIN 81 MG PO TBEC
81.0000 mg | DELAYED_RELEASE_TABLET | Freq: Every day | ORAL | Status: DC
  Administered 2024-01-07: 81 mg via ORAL
  Filled 2024-01-07: qty 1

## 2024-01-07 MED ORDER — BOOST / RESOURCE BREEZE PO LIQD CUSTOM
1.0000 | Freq: Three times a day (TID) | ORAL | Status: DC
  Administered 2024-01-07: 1 via ORAL

## 2024-01-07 MED ORDER — FLUTICASONE PROPIONATE 50 MCG/ACT NA SUSP
2.0000 | Freq: Every day | NASAL | Status: DC
  Administered 2024-01-07: 2 via NASAL
  Filled 2024-01-07: qty 16

## 2024-01-07 MED ORDER — DIAZEPAM 5 MG PO TABS: 2.5000 mg | ORAL_TABLET | Freq: Every day | ORAL | Status: DC | PRN

## 2024-01-07 NOTE — Progress Notes (Signed)
  Echocardiogram 2D Echocardiogram has been performed.  Ocie Doyne RDCS 01/07/2024, 12:12 PM

## 2024-01-07 NOTE — Evaluation (Signed)
 Physical Therapy Evaluation Patient Details Name: Christine Leblanc MRN: [id number] DOB: June 06, 1929 Today's Date: 01/07/2024  History of Present Illness  Christine Leblanc is a 89 y.o. female with medical history significant for COPD, hypertension, MGUS, seizures.  Patient was brought to the ED via EMS reports of confusion.  At the time of my evaluation patient's is back to baseline, and remembers the events of today, and her son- Christine Leblanc is at bedside.  At about 11:45 AM, patient was alone, when confusion started.  She called her daughterGLENWOOD Leblanc, thinking she was talking to her son, patient speech was confused, but they do not think it was garbled or slurred.  EMS was subsequently called, patient was still confused on EMS arrival, but on arrival to the ED, patient was back to baseline.  No facial asymmetry, no weakness of extremities noted.  She otherwise denies any other symptoms.  Prior history of stroke/TIA 2011.   Clinical Impression  Patient functioning near baseline for functional mobility and gait demonstrating fair/good return for transferring to chair and ambulating in hallway using RW without loss of balance.  Patient limited for gait training mostly due to c/o fatigue and tolerated sitting up in chair with her son present. Patient will benefit from continued skilled physical therapy in hospital and recommended venue below to increase strength, balance, endurance for safe ADLs and gait.          If plan is discharge home, recommend the following: A little help with walking and/or transfers;A little help with bathing/dressing/bathroom;Help with stairs or ramp for entrance;Assistance with cooking/housework   Can travel by private vehicle        Equipment Recommendations None recommended by PT  Recommendations for Other Services       Functional Status Assessment Patient has had a recent decline in their functional status and demonstrates the ability to make significant improvements in function  in a reasonable and predictable amount of time.     Precautions / Restrictions Precautions Precautions: Fall Restrictions Weight Bearing Restrictions Per Provider Order: No      Mobility  Bed Mobility Overal bed mobility: Needs Assistance Bed Mobility: Supine to Sit     Supine to sit: HOB elevated, Supervision     General bed mobility comments: slightly labored movement for sitting up at bedside    Transfers Overall transfer level: Needs assistance Equipment used: Rolling walker (2 wheels) Transfers: Sit to/from Stand, Bed to chair/wheelchair/BSC Sit to Stand: Supervision, Contact guard assist   Step pivot transfers: Supervision, Contact guard assist       General transfer comment: increased time    Ambulation/Gait Ambulation/Gait assistance: Supervision, Contact guard assist Gait Distance (Feet): 45 Feet Assistive device: Rolling walker (2 wheels) Gait Pattern/deviations: Decreased step length - right, Decreased step length - left, WFL(Within Functional Limits) Gait velocity: decreased     General Gait Details: slow slightly labored cadence without loss of balance, limited mostly due to c/o fatigue  Stairs            Wheelchair Mobility     Tilt Bed    Modified Rankin (Stroke Patients Only)       Balance Overall balance assessment: Needs assistance Sitting-balance support: Feet supported, No upper extremity supported Sitting balance-Leahy Scale: Fair Sitting balance - Comments: fair/good seated at EOB   Standing balance support: During functional activity, No upper extremity supported Standing balance-Leahy Scale: Fair Standing balance comment: fair/good using RW  Pertinent Vitals/Pain Pain Assessment Pain Assessment: No/denies pain    Home Living Family/patient expects to be discharged to:: Private residence Living Arrangements: Children Available Help at Discharge: Family;Available 24  hours/day Type of Home: House Home Access: Stairs to enter Entrance Stairs-Rails: None Entrance Stairs-Number of Steps: 1   Home Layout: One level;Laundry or work area in basement;Able to live on main level with bedroom/bathroom Home Equipment: Agricultural Consultant (2 wheels);Cane - single point;Grab bars - tub/shower;Shower seat;BSC/3in1      Prior Function Prior Level of Function : Needs assist       Physical Assist : Mobility (physical);ADLs (physical) Mobility (physical): Bed mobility;Transfers;Gait;Stairs   Mobility Comments: household ambulation using RW ADLs Comments: Assisted by family for LB dressing, meal preparation, housekeeping     Extremity/Trunk Assessment   Upper Extremity Assessment Upper Extremity Assessment: Overall WFL for tasks assessed    Lower Extremity Assessment Lower Extremity Assessment: Generalized weakness    Cervical / Trunk Assessment Cervical / Trunk Assessment: Kyphotic  Communication   Communication Communication: No apparent difficulties  Cognition Arousal: Alert Behavior During Therapy: WFL for tasks assessed/performed Overall Cognitive Status: Within Functional Limits for tasks assessed                                          General Comments      Exercises     Assessment/Plan    PT Assessment Patient needs continued PT services  PT Problem List Decreased strength;Decreased activity tolerance;Decreased balance;Decreased mobility       PT Treatment Interventions DME instruction;Gait training;Stair training;Functional mobility training;Therapeutic activities;Therapeutic exercise;Balance training;Patient/family education    PT Goals (Current goals can be found in the Care Plan section)  Acute Rehab PT Goals Patient Stated Goal: return home with family to assist PT Goal Formulation: With patient/family Time For Goal Achievement: 01/10/24 Potential to Achieve Goals: Good    Frequency Min 3X/week      Co-evaluation               AM-PAC PT 6 Clicks Mobility  Outcome Measure Help needed turning from your back to your side while in a flat bed without using bedrails?: None Help needed moving from lying on your back to sitting on the side of a flat bed without using bedrails?: A Little Help needed moving to and from a bed to a chair (including a wheelchair)?: A Little Help needed standing up from a chair using your arms (e.g., wheelchair or bedside chair)?: A Little Help needed to walk in hospital room?: A Little Help needed climbing 3-5 steps with a railing? : A Lot 6 Click Score: 18    End of Session   Activity Tolerance: Patient tolerated treatment well;Patient limited by fatigue Patient left: in chair;with call bell/phone within reach;with family/visitor present Nurse Communication: Mobility status PT Visit Diagnosis: Unsteadiness on feet (R26.81);Other abnormalities of gait and mobility (R26.89);Muscle weakness (generalized) (M62.81)    Time: 1040-1107 PT Time Calculation (min) (ACUTE ONLY): 27 min   Charges:   PT Evaluation $PT Eval Moderate Complexity: 1 Mod PT Treatments $Therapeutic Activity: 23-37 mins PT General Charges $$ ACUTE PT VISIT: 1 Visit         3:02 PM, 01/07/24 Lynwood Music, MPT Physical Therapist with [facility] [phone] office [phone] mobile phone

## 2024-01-07 NOTE — Progress Notes (Signed)
 SLP Cancellation Note  Patient Details Name: Christine Leblanc MRN: [id number] DOB: 1929-09-12   Cancelled treatment:       Reason Eval/Treat Not Completed: Patient at procedure or test/unavailable. Pt currently OTF for MRI; 01/06/2024- MRI Brain without contrast reveals no acute or reversible finding. SLP continue efforts and complete SLE if indicated. Thank you,  Bocephus Cali H. Clois KILLIAN, CCC-SLP Speech Language Pathologist    Raguel VEAR Clois 01/07/2024, 4:04 PM

## 2024-01-07 NOTE — ED Notes (Signed)
 ED TO INPATIENT HANDOFF REPORT  ED Nurse Name and Phone #: Leandrew RAMAN RN   S Name/Age/Gender Christine Leblanc 89 y.o. female Room/Bed: APA08/APA08  Code Status   Code Status: Full Code  Home/SNF/Other Home Patient oriented to: self, place, and situation Is this baseline? Yes   Triage Complete: Triage complete  Chief Complaint TIA (transient ischemic attack) [G45.9]  Triage Note Pt bib EMS from home for confusion that started around 1145,  pt AAOx4, pt states that she couldn't remember if she took her med this morning. Endorses headaches and confusions afterwards. Hx of TIA  Pt bib EMS from home for confusion that started around 1145,  pt AAOx4, pt states that she couldn't remember if she took her meds this morning, confused to the point where she couldn't dial a number or think straight. Endorses headaches and confusions afterwards. Hx of TIA   Allergies Allergies  Allergen Reactions   Celecoxib Shortness Of Breath   Dexlansoprazole  Anaphylaxis and Other (See Comments)    abd pain   Famotidine Other (See Comments)    Makes her feel very bad    Level of Care/Admitting Diagnosis ED Disposition     ED Disposition  Admit   Condition  --   Comment  Hospital Area: [facility] [[id number]]  Level of Care: Telemetry [5]  Covid Evaluation: Asymptomatic - no recent exposure (last 10 days) testing not required  Diagnosis: TIA (transient ischemic attack) [832385]  Admitting Physician: EMOKPAE, EJIROGHENE E [phone]  Attending Physician: EMOKPAE, EJIROGHENE E EVELYNN.FILLER          B Medical/Surgery History Past Medical History:  Diagnosis Date   Anemia    Asthma    Baker's cyst    Cancer (HCC)    skin Ca- ? basal cell    Chronic back pain    Chronic neck pain    Degenerative joint disease    Left shoulder; cervical spine, knees & hands    Gastroesophageal reflux disease    Hiatal hernia; distal esophageal web requiring dilatation; gastric polyps; gastritis;  refuses colonoscopy   GERD (gastroesophageal reflux disease)    History of stress test 1990's   stress test done under the care of Dr. Juventino & Dr. Court, now being followed by Dr. Alvan- in [place] , recently seen & told to f/U in one yr.    Hyperlipidemia 04/27/2011   Hypertension    Hypothyroidism    Lymphocytic thyroiditis    Mild carotid artery disease (HCC)    Multiple thyroid  nodules 2010   Adenomatous; thyroidectomy in 2010   Orthostasis    Osteoarthritis of right knee 07/27/2014   Pneumonia    hosp. for pneumonia- long time ago    Primary localized osteoarthrosis of right shoulder 06/21/2015   Seizures (HCC)    yes- as a child- & into adult years, states she took med. for them at one time, stopped at 32 yrs. of age    Syncope and collapse 2007   Possible CVA in 2012 with left lower extremity weakness; refused hospitalization; CT-Atrophy and chronic microvascular ischemic change.    Past Surgical History:  Procedure Laterality Date   ABDOMINAL HYSTERECTOMY     fibroids   CATARACT EXTRACTION     Bilateral; redo surgery on the right for incomplete primary procedure   COLONOSCOPY  Remote   ESOPHAGOGASTRODUODENOSCOPY  11/2009   Dr. Harvey: probable distal web s/p dilation small hiatal hernia/gastric polyps/mild gastritis, appeared to have narrowing at the junction of D1  and D2 dilated up to 12 mm   ESOPHAGOGASTRODUODENOSCOPY N/A 03/21/2015   Surgeon: Margo LITTIE Haddock, MD; proximal esophageal web s/p dilation, multiple gastric polyps s/p multiple biopsies, mild erosive gastritis s/p biopsy.  Pathology with fundic gland polyps, chronic gastritis, negative for H. pylori.   EYE SURGERY     PROLAPSED UTERINE FIBROID LIGATION     outcomed with rectocele & cystocele   SAVORY DILATION N/A 03/21/2015   Procedure: SAVORY DILATION;  Surgeon: Margo LITTIE Haddock, MD;  Location: [facility];  Service: Endoscopy;  Laterality: N/A;   SHOULDER SURGERY     left   TOTAL KNEE ARTHROPLASTY  Right 07/27/2014   Procedure: RIGHT TOTAL KNEE ARTHROPLASTY;  Surgeon: Fonda SHAUNNA Olmsted, MD;  Location: [facility] OR;  Service: Orthopedics;  Laterality: Right;   TOTAL SHOULDER ARTHROPLASTY Right 06/21/2015   Procedure: RIGHT TOTAL SHOULDER ARTHROPLASTY;  Surgeon: Fonda Olmsted, MD;  Location: [facility] OR;  Service: Orthopedics;  Laterality: Right;   TOTAL THYROIDECTOMY  2010     A IV Location/Drains/Wounds Patient Lines/Drains/Airways Status     Active Line/Drains/Airways     Name Placement date Placement time Site Days   Peripheral IV 01/06/24 18 G 1 Right Antecubital 01/06/24  1304  Antecubital  1            Intake/Output Last 24 hours No intake or output data in the 24 hours ending 01/07/24 0029  Labs/Imaging Results for orders placed or performed during the hospital encounter of 01/06/24 (from the past 48 hours)  CBC with Differential     Status: Abnormal   Collection Time: 01/06/24  2:05 PM  Result Value Ref Range   WBC 5.7 4.0 - 10.5 K/uL   RBC 3.71 (L) 3.87 - 5.11 MIL/uL   Hemoglobin 11.0 (L) 12.0 - 15.0 g/dL   HCT 65.4 (L) 63.9 - 53.9 %   MCV 93.0 80.0 - 100.0 fL   MCH 29.6 26.0 - 34.0 pg   MCHC 31.9 30.0 - 36.0 g/dL   RDW 85.5 88.4 - 84.4 %   Platelets 162 150 - 400 K/uL   nRBC 0.0 0.0 - 0.2 %   Neutrophils Relative % 59 %   Neutro Abs 3.4 1.7 - 7.7 K/uL   Lymphocytes Relative 22 %   Lymphs Abs 1.2 0.7 - 4.0 K/uL   Monocytes Relative 10 %   Monocytes Absolute 0.6 0.1 - 1.0 K/uL   Eosinophils Relative 8 %   Eosinophils Absolute 0.4 0.0 - 0.5 K/uL   Basophils Relative 1 %   Basophils Absolute 0.0 0.0 - 0.1 K/uL   Immature Granulocytes 0 %   Abs Immature Granulocytes 0.01 0.00 - 0.07 K/uL    Comment: Performed at [facility], [address], [place], [place] [address]  Comprehensive metabolic panel     Status: Abnormal   Collection Time: 01/06/24  2:05 PM  Result Value Ref Range   Sodium 139 135 - 145 mmol/L   Potassium 3.4 (L) 3.5 - 5.1 mmol/L   Chloride 103 98 -  111 mmol/L   CO2 29 22 - 32 mmol/L   Glucose, Bld 99 70 - 99 mg/dL    Comment: Glucose reference range applies only to samples taken after fasting for at least 8 hours.   BUN 19 8 - 23 mg/dL   Creatinine, Ser 9.02 0.44 - 1.00 mg/dL   Calcium  8.9 8.9 - 10.3 mg/dL   Total Protein 7.0 6.5 - 8.1 g/dL   Albumin 3.5 3.5 - 5.0  g/dL   AST 19 15 - 41 U/L   ALT 13 0 - 44 U/L   Alkaline Phosphatase 68 38 - 126 U/L   Total Bilirubin 0.3 0.0 - 1.2 mg/dL   GFR, Estimated 55 (L) >60 mL/min    Comment: (NOTE) Calculated using the CKD-EPI Creatinine Equation (2021)    Anion gap 7 5 - 15    Comment: Performed at [facility], [address] [place], [place] [address]  Protime-INR     Status: None   Collection Time: 01/06/24  2:05 PM  Result Value Ref Range   Prothrombin Time 14.2 11.4 - 15.2 seconds   INR 1.1 0.8 - 1.2    Comment: (NOTE) INR goal varies based on device and disease states. Performed at [facility], [address], [place], [place] [address]   Urinalysis, Routine w reflex microscopic -Urine, Catheterized     Status: None   Collection Time: 01/06/24  4:27 PM  Result Value Ref Range   Color, Urine YELLOW YELLOW   APPearance CLEAR CLEAR   Specific Gravity, Urine 1.013 1.005 - 1.030   pH 6.0 5.0 - 8.0   Glucose, UA NEGATIVE NEGATIVE mg/dL   Hgb urine dipstick NEGATIVE NEGATIVE   Bilirubin Urine NEGATIVE NEGATIVE   Ketones, ur NEGATIVE NEGATIVE mg/dL   Protein, ur NEGATIVE NEGATIVE mg/dL   Nitrite NEGATIVE NEGATIVE   Leukocytes,Ua NEGATIVE NEGATIVE    Comment: Performed at [facility], [address], [place], [place] [address]   MR BRAIN WO CONTRAST Result Date: 01/06/2024 CLINICAL DATA:  Transient ischemic attack.  Confusion.  Headache. EXAM: MRI HEAD WITHOUT CONTRAST TECHNIQUE: Multiplanar, multiecho pulse sequences of the brain and surrounding structures were obtained without intravenous contrast. COMPARISON:  Head CT 12/09/2023.  MRI 07/09/2016. FINDINGS: Brain: Diffusion  imaging does not show any acute or subacute infarction or other cause of restricted diffusion. Chronic small-vessel ischemic changes are present throughout the pons. No focal cerebellar finding. Cerebral hemispheres show an old lacunar infarction in the right basal ganglia/external capsule. Chronic small-vessel ischemic changes are present within the thalami, basal ganglia and throughout the cerebral hemispheric deep white matter. No large vessel territory stroke. No mass lesion, recent hemorrhage, hydrocephalus or extra-axial collection. Some hemosiderin deposition associated with the small vessel insults in the right basal ganglia region. Vascular: Major vessels at the base of the brain show flow. Skull and upper cervical spine: Negative Sinuses/Orbits: Mild mucosal thickening. No advanced sinusitis. Orbits negative. Other: None IMPRESSION: No acute or reversible finding. Chronic small-vessel ischemic changes throughout the pons, thalami, basal ganglia and cerebral hemispheric deep white matter. Old lacunar infarction in the right basal ganglia/external capsule. Electronically Signed   By: Oneil Officer M.D.   On: 01/06/2024 16:47   DG Chest Portable 1 View Result Date: 01/06/2024 CLINICAL DATA:  Altered mental status. EXAM: PORTABLE CHEST 1 VIEW COMPARISON:  12/09/2023 FINDINGS: The heart size and mediastinal contours are within normal limits. Pulmonary hyperinflation again seen, consistent with COPD. Both lungs are clear. Bilateral shoulder prostheses and superior mediastinal surgical clips again noted. IMPRESSION: COPD. No active disease. Electronically Signed   By: Norleen DELENA Kil M.D.   On: 01/06/2024 14:57    Pending Labs Unresulted Labs (From admission, onward)     Start     Ordered   01/07/24 0500  Vitamin B12  Tomorrow morning,   R        01/06/24 2208   01/07/24 0500  Folate  Tomorrow morning,   R  01/06/24 2208   01/07/24 0500  TSH  Tomorrow morning,   R        01/06/24 2208   01/07/24  0500  Lipid panel  Tomorrow morning,   R        01/06/24 2208   01/07/24 0500  Hemoglobin A1c  Tomorrow morning,   R        01/06/24 2208            Vitals/Pain Today's Vitals   01/06/24 1315 01/06/24 1330 01/06/24 1345 01/06/24 1626  BP: (!) 175/84 (!) 183/89 (!) 177/89 (!) 183/101  Pulse: 75 76 76 75  Resp: (!) 26 20 20 17   Temp:      SpO2: 95% 96% 93% 96%  Weight:      Height:      PainSc:        Isolation Precautions No active isolations  Medications Medications  acetaminophen  (TYLENOL ) tablet 650 mg (650 mg Oral Given 01/06/24 1719)  aspirin  tablet 325 mg (325 mg Oral Given 01/06/24 2254)    Mobility walks with person assist     Focused Assessments    R Recommendations: See Admitting Provider Note  Report given to:   Additional Notes:

## 2024-01-07 NOTE — Plan of Care (Signed)
 Problem: Education: Goal: Knowledge of General Education information will improve Description: Including pain rating scale, medication(s)/side effects and non-pharmacologic comfort measures 01/07/2024 1819 by Meg Heron HERO, RN Outcome: Adequate for Discharge 01/07/2024 1735 by Meg Heron HERO, RN Outcome: Progressing   Problem: Health Behavior/Discharge Planning: Goal: Ability to manage health-related needs will improve 01/07/2024 1819 by Meg Heron HERO, RN Outcome: Adequate for Discharge 01/07/2024 1735 by Meg Heron HERO, RN Outcome: Progressing   Problem: Clinical Measurements: Goal: Ability to maintain clinical measurements within normal limits will improve 01/07/2024 1819 by Meg Heron HERO, RN Outcome: Adequate for Discharge 01/07/2024 1735 by Meg Heron HERO, RN Outcome: Progressing Goal: Will remain free from infection 01/07/2024 1819 by Meg Heron HERO, RN Outcome: Adequate for Discharge 01/07/2024 1735 by Meg Heron HERO, RN Outcome: Progressing Goal: Diagnostic test results will improve 01/07/2024 1819 by Meg Heron HERO, RN Outcome: Adequate for Discharge 01/07/2024 1735 by Meg Heron HERO, RN Outcome: Progressing Goal: Respiratory complications will improve 01/07/2024 1819 by Meg Heron HERO, RN Outcome: Adequate for Discharge 01/07/2024 1735 by Meg Heron HERO, RN Outcome: Progressing Goal: Cardiovascular complication will be avoided 01/07/2024 1819 by Meg Heron HERO, RN Outcome: Adequate for Discharge 01/07/2024 1735 by Meg Heron HERO, RN Outcome: Progressing   Problem: Activity: Goal: Risk for activity intolerance will decrease 01/07/2024 1819 by Meg Heron HERO, RN Outcome: Adequate for Discharge 01/07/2024 1735 by Meg Heron HERO, RN Outcome: Progressing   Problem: Nutrition: Goal: Adequate nutrition will be maintained 01/07/2024 1819 by Meg Heron HERO, RN Outcome:  Adequate for Discharge 01/07/2024 1735 by Meg Heron HERO, RN Outcome: Progressing   Problem: Coping: Goal: Level of anxiety will decrease 01/07/2024 1819 by Meg Heron HERO, RN Outcome: Adequate for Discharge 01/07/2024 1735 by Meg Heron HERO, RN Outcome: Progressing   Problem: Elimination: Goal: Will not experience complications related to bowel motility 01/07/2024 1819 by Meg Heron HERO, RN Outcome: Adequate for Discharge 01/07/2024 1735 by Meg Heron HERO, RN Outcome: Progressing Goal: Will not experience complications related to urinary retention 01/07/2024 1819 by Meg Heron HERO, RN Outcome: Adequate for Discharge 01/07/2024 1735 by Meg Heron HERO, RN Outcome: Progressing   Problem: Pain Management: Goal: General experience of comfort will improve 01/07/2024 1819 by Meg Heron HERO, RN Outcome: Adequate for Discharge 01/07/2024 1735 by Meg Heron HERO, RN Outcome: Progressing   Problem: Safety: Goal: Ability to remain free from injury will improve 01/07/2024 1819 by Meg Heron HERO, RN Outcome: Adequate for Discharge 01/07/2024 1735 by Meg Heron HERO, RN Outcome: Progressing   Problem: Skin Integrity: Goal: Risk for impaired skin integrity will decrease 01/07/2024 1819 by Meg Heron HERO, RN Outcome: Adequate for Discharge 01/07/2024 1735 by Meg Heron HERO, RN Outcome: Progressing   Problem: Education: Goal: Knowledge of disease or condition will improve 01/07/2024 1819 by Meg Heron HERO, RN Outcome: Adequate for Discharge 01/07/2024 1735 by Meg Heron HERO, RN Outcome: Progressing Goal: Knowledge of secondary prevention will improve (MUST DOCUMENT ALL) 01/07/2024 1819 by Meg Heron HERO, RN Outcome: Adequate for Discharge 01/07/2024 1735 by Meg Heron HERO, RN Outcome: Progressing Goal: Knowledge of patient specific risk factors will improve Christine Leblanc or DELETE if not current risk  factor) 01/07/2024 1819 by Meg Heron HERO, RN Outcome: Adequate for Discharge 01/07/2024 1735 by Meg Heron HERO, RN Outcome: Progressing   Problem: Ischemic Stroke/TIA Tissue Perfusion: Goal: Complications of ischemic stroke/TIA will be minimized 01/07/2024 1819 by Meg Heron HERO, RN Outcome: Adequate for Discharge 01/07/2024 1735 by Meg Heron HERO, RN  Outcome: Progressing   Problem: Coping: Goal: Will verbalize positive feelings about self 01/07/2024 1819 by Meg Heron HERO, RN Outcome: Adequate for Discharge 01/07/2024 1735 by Meg Heron HERO, RN Outcome: Progressing Goal: Will identify appropriate support needs 01/07/2024 1819 by Meg Heron HERO, RN Outcome: Adequate for Discharge 01/07/2024 1735 by Meg Heron HERO, RN Outcome: Progressing   Problem: Health Behavior/Discharge Planning: Goal: Ability to manage health-related needs will improve 01/07/2024 1819 by Meg Heron HERO, RN Outcome: Adequate for Discharge 01/07/2024 1735 by Meg Heron HERO, RN Outcome: Progressing Goal: Goals will be collaboratively established with patient/family 01/07/2024 1819 by Meg Heron HERO, RN Outcome: Adequate for Discharge 01/07/2024 1735 by Meg Heron HERO, RN Outcome: Progressing   Problem: Self-Care: Goal: Ability to participate in self-care as condition permits will improve 01/07/2024 1819 by Meg Heron HERO, RN Outcome: Adequate for Discharge 01/07/2024 1735 by Meg Heron HERO, RN Outcome: Progressing Goal: Verbalization of feelings and concerns over difficulty with self-care will improve 01/07/2024 1819 by Meg Heron HERO, RN Outcome: Adequate for Discharge 01/07/2024 1735 by Meg Heron HERO, RN Outcome: Progressing Goal: Ability to communicate needs accurately will improve 01/07/2024 1819 by Meg Heron HERO, RN Outcome: Adequate for Discharge 01/07/2024 1735 by Meg Heron HERO, RN Outcome:  Progressing   Problem: Nutrition: Goal: Risk of aspiration will decrease 01/07/2024 1819 by Meg Heron HERO, RN Outcome: Adequate for Discharge 01/07/2024 1735 by Meg Heron HERO, RN Outcome: Progressing Goal: Dietary intake will improve 01/07/2024 1819 by Meg Heron HERO, RN Outcome: Adequate for Discharge 01/07/2024 1735 by Meg Heron HERO, RN Outcome: Progressing

## 2024-01-07 NOTE — Consult Note (Signed)
 I connected with  Christine Leblanc on 01/07/24 by a video enabled telemedicine application and verified that I am speaking with the correct person using two identifiers.   I discussed the limitations of evaluation and management by telemedicine. The patient expressed understanding and agreed to proceed.  Location of patient: [facility] Location physician: [facility]   Neurology Consultation Reason for Consult: Transient confusion Referring Physician: Dr Tully Carwin  CC: ams  History is obtained from: Patient, chart review  HPI: Christine Leblanc is a 89 y.o. female with past medical history of hypertension, hyperlipidemia who presented after an episode of confusion.  Per son at bedside, he last saw her normal at around 11 AM on 01/06/2024.  At around 12, patient called son and kept repeating hold on, hold on.  Patient then called her daughter.  However the words she was saying were not making sense to the daughter.  When son came back to the house at around 1215, she was already starting to improve but could not remember things.  However they brought her to the hospital due to concern for stroke.  Currently per son, patient is back to baseline.  Patient states she has had TIAs in the past and takes aspirin  81 mg daily.  Blood pressure on arrival was 183/89.  Blood glucose was 99.  No UTI.  TSH 12.08, T4 pending.  Of note, patient states she had a seizure when she was in grade 7.  Then had another generalized tonic-clonic seizure when she was 89 years old.  States she has not been on medications and has not had any seizure in over 60 years.  Last known normal: 01/06/2024 around 11 AM No tPA as outside window No thrombectomy as outside window and low suspicion for large vessel occlusion Event happened at home mRS 0  ROS: All other systems reviewed and negative except as noted in the HPI.    Past Medical History:  Diagnosis Date   Anemia    Asthma    Baker's cyst     Cancer (HCC)    skin Ca- ? basal cell    Chronic back pain    Chronic neck pain    Degenerative joint disease    Left shoulder; cervical spine, knees & hands    Gastroesophageal reflux disease    Hiatal hernia; distal esophageal web requiring dilatation; gastric polyps; gastritis; refuses colonoscopy   GERD (gastroesophageal reflux disease)    History of stress test 1990's   stress test done under the care of Dr. Juventino & Dr. Court, now being followed by Dr. Alvan- in [place] , recently seen & told to f/U in one yr.    Hyperlipidemia 04/27/2011   Hypertension    Hypothyroidism    Lymphocytic thyroiditis    Mild carotid artery disease (HCC)    Multiple thyroid  nodules 2010   Adenomatous; thyroidectomy in 2010   Orthostasis    Osteoarthritis of right knee 07/27/2014   Pneumonia    hosp. for pneumonia- long time ago    Primary localized osteoarthrosis of right shoulder 06/21/2015   Seizures (HCC)    yes- as a child- & into adult years, states she took med. for them at one time, stopped at 32 yrs. of age    Syncope and collapse 2007   Possible CVA in 2012 with left lower extremity weakness; refused hospitalization; CT-Atrophy and chronic microvascular ischemic change.     Family History  Problem Relation Age of Onset  Heart disease Mother    Gallbladder disease Mother    Heart failure Mother    Diabetes Mother    Breast cancer Sister        30 years ago   Diabetes Sister    Heart attack Father    Diabetes Maternal Grandmother    Colon cancer Neg Hx    Stroke Neg Hx     Social History:  reports that she quit smoking about 45 years ago. Her smoking use included cigarettes. She started smoking about 65 years ago. She has a 16 pack-year smoking history. She has never used smokeless tobacco. She reports that she does not currently use alcohol . She reports that she does not use drugs.   Medications Prior to Admission  Medication Sig Dispense Refill Last Dose/Taking    aspirin  EC 81 MG tablet Take 1 tablet (81 mg total) by mouth daily. Swallow whole. 90 tablet 2 01/06/2024 at 12:15 PM   Carbinoxamine  Maleate 4 MG TABS Take 1 tablet (4 mg total) by mouth at bedtime. 90 tablet 4 01/05/2024 Bedtime   diazepam  (VALIUM ) 5 MG tablet Take 2.5 mg by mouth daily as needed for anxiety.   01/06/2024 at 12:30 PM   ferrous sulfate  325 (65 FE) MG tablet Take 325 mg by mouth. Takes one on Monday, Wednesday and Friday   01/06/2024 Morning   fluticasone  (FLONASE ) 50 MCG/ACT nasal spray Place 2 sprays into both nostrils daily. 16 g 5 01/05/2024 Bedtime   ipratropium (ATROVENT ) 0.06 % nasal spray Place 1 spray into both nostrils 4 (four) times daily as needed for rhinitis. 15 mL 5 01/05/2024 Bedtime   levothyroxine  (SYNTHROID ) 88 MCG tablet TAKE 1 TABLET DAILY ON MONDAY THROUGH SATURDAY AND ONE-HALF (1/2) TABLET ON SUNDAY (Patient taking differently: Take 88 mcg by mouth See admin instructions. TAKE 1 TABLET DAILY ON MONDAY THROUGH SATURDAY AND ONE-HALF (1/2) TABLET ON SUNDAY) 90 tablet 3 01/06/2024 Morning   pantoprazole  (PROTONIX ) 40 MG tablet Take 1 tablet (40 mg total) by mouth 2 (two) times daily before a meal. 180 tablet 3 01/06/2024 Morning   pravastatin  (PRAVACHOL ) 40 MG tablet TAKE 2 TABLETS DAILY 180 tablet 3 01/05/2024 Bedtime   senna (SENOKOT) 8.6 MG tablet Take 2 tablets by mouth at bedtime.   01/05/2024 Bedtime      Exam: Current vital signs: BP (!) 157/79 (BP Location: Right Arm)   Pulse 70   Temp (!) 97.5 F (36.4 C) (Oral)   Resp 17   Ht 5' 1 (1.549 m)   Wt 47.9 kg   SpO2 100%   BMI 19.95 kg/m  Vital signs in last 24 hours: Temp:  [97.5 F (36.4 C)-98.4 F (36.9 C)] 97.5 F (36.4 C) (01/07 0654) Pulse Rate:  [70-79] 70 (01/07 0731) Resp:  [16-26] 17 (01/07 0654) BP: (147-191)/(76-101) 157/79 (01/07 0731) SpO2:  [93 %-100 %] 100 % (01/07 0731) Weight:  [47.9 kg] 47.9 kg (01/06 1300)   Physical Exam  Constitutional: Appears well-developed and well-nourished.   Psych: Affect appropriate to situation Neuro: AOx3, no aphasia, cranial nerves grossly intact, antigravity strength in all 4 extremities without drift, FTN intact bilaterally, sensation intact to light touch   NIHSS 0   I have reviewed labs in epic and the results pertinent to this consultation are: CBC:  Recent Labs  Lab 01/06/24 1405  WBC 5.7  NEUTROABS 3.4  HGB 11.0*  HCT 34.5*  MCV 93.0  PLT 162    Basic Metabolic Panel:  Lab Results  Component  Value Date   NA 139 01/06/2024   K 3.4 (L) 01/06/2024   CO2 29 01/06/2024   GLUCOSE 99 01/06/2024   BUN 19 01/06/2024   CREATININE 0.97 01/06/2024   CALCIUM  8.9 01/06/2024   GFRNONAA 55 (L) 01/06/2024   GFRAA 49 (L) 05/20/2020   Lipid Panel:  Lab Results  Component Value Date   LDLCALC 87 01/07/2024   HgbA1c: No results found for: HGBA1C Urine Drug Screen: No results found for: LABOPIA, COCAINSCRNUR, LABBENZ, AMPHETMU, THCU, LABBARB  Alcohol  Level No results found for: ETH   I have reviewed the images obtained:  MRI Brain without contrast 01/06/2024: No acute or reversible finding. Chronic small-vessel ischemic changes throughout the pons, thalami, basal ganglia and cerebral hemispheric deep white matter. Old lacunar infarction in the right basal ganglia/external capsule.     ASSESSMENT/PLAN: 89 year old female presented with transient alteration of awareness lasting for about 1 hour.  TIA -Patient symptoms are most likely secondary to transient ischemic attack likely secondary to small vessel disease  Recommendations: -MRA head without contrast to look for intracranial stenosis. -Carotid ultrasound and TTE ordered and pending -If MRA head shows symptomatic intracranial stenosis, would recommend aspirin  81 mg daily and Plavix  75 mg daily for 3 months followed by Plavix  75 mg daily -If MRA head does not show symptomatic intracranial stenosis, would recommend aspirin  81 mg daily and Plavix  75 mg  daily for 3 weeks followed by Plavix  75 mg daily -Okay to discharge from neurology standpoint if carotid ultrasound and TTE within normal limits -Goal blood pressure: Normotension -Discussed plan with patient and son at bedside -Discussed plan with Dr. Maree via secure chat -Follow-up with [facility] neurology in 2 to 3 months (order placed) -Defer management of elevated TSH to medicine team   Thank you for allowing us  to participate in the care of this patient. If you have any further questions, please contact  me or neurohospitalist.   Arlin [facility]bs [facility] neuro[facility]ist

## 2024-01-07 NOTE — Discharge Summary (Signed)
 Physician Discharge Summary  Christine Leblanc FMW:[id number] DOB: 1929-05-11 DOA: 01/06/2024  PCP: Shona Norleen PEDLAR, MD  Admit date: 01/06/2024  Discharge date: 01/07/2024  Admitted From:Home  Disposition:  Home  Recommendations for Outpatient Follow-up:  Follow up with PCP in 1-2 weeks Remain on aspirin  and Plavix  as prescribed for 3 months followed by Plavix  75 mg daily Follow-up with [facility] in 2-3 months  Home Health: None  Equipment/Devices: None  Discharge Condition:Stable  CODE STATUS: Full  Diet recommendation: Heart Healthy  Brief/Interim Summary: Christine Leblanc is a 89 y.o. female with medical history significant for COPD, hypertension, MGUS, seizures. Patient was brought to the ED via EMS reports of confusion.  Patient was not noted to have any significant findings on brain MRI and was thought to have transient alteration of awareness related to TIA.  MRA brain performed with no significant findings.  She is now in stable condition for discharge with no other acute events or concerns noted.  Discharge Diagnoses:  Principal Problem:   TIA (transient ischemic attack) Active Problems:   COPD (chronic obstructive pulmonary disease) (HCC)   Dyslipidemia   Essential hypertension   MGUS (monoclonal gammopathy of unknown significance)  Principal discharge diagnosis: TIA.  Discharge Instructions  Discharge Instructions     Ambulatory referral to Neurology   Complete by: As directed    An appointment is requested in approximately: 8-12 weeks   Diet - low sodium heart healthy   Complete by: As directed    Increase activity slowly   Complete by: As directed       Allergies as of 01/07/2024       Reactions   Celecoxib Shortness Of Breath   Dexlansoprazole  Anaphylaxis, Other (See Comments)   abd pain   Famotidine Other (See Comments)   Makes her feel very bad        Medication List     STOP taking these medications    pravastatin  40 MG  tablet Commonly known as: PRAVACHOL        TAKE these medications    aspirin  EC 81 MG tablet Take 1 tablet (81 mg total) by mouth daily. Swallow whole. Start taking on: January 08, 2024   atorvastatin  40 MG tablet Commonly known as: LIPITOR Take 1 tablet (40 mg total) by mouth at bedtime. Start taking on: January 08, 2024   Carbinoxamine  Maleate 4 MG Tabs Take 1 tablet (4 mg total) by mouth at bedtime.   clopidogrel  75 MG tablet Commonly known as: PLAVIX  Take 1 tablet (75 mg total) by mouth daily. Start taking on: January 08, 2024   diazepam  5 MG tablet Commonly known as: VALIUM  Take 2.5 mg by mouth daily as needed for anxiety.   ferrous sulfate  325 (65 FE) MG tablet Take 325 mg by mouth. Takes one on Monday, Wednesday and Friday   fluticasone  50 MCG/ACT nasal spray Commonly known as: FLONASE  Place 2 sprays into both nostrils daily.   ipratropium 0.06 % nasal spray Commonly known as: ATROVENT  Place 1 spray into both nostrils 4 (four) times daily as needed for rhinitis.   levothyroxine  88 MCG tablet Commonly known as: SYNTHROID  TAKE 1 TABLET DAILY ON MONDAY THROUGH SATURDAY AND ONE-HALF (1/2) TABLET ON SUNDAY What changed: See the new instructions.   pantoprazole  40 MG tablet Commonly known as: PROTONIX  Take 1 tablet (40 mg total) by mouth 2 (two) times daily before a meal.   senna 8.6 MG tablet Commonly known as: SENOKOT Take 2 tablets by mouth  at bedtime.        Follow-up Information     [facility] Follow up.   Specialty: Home Health Services Why: They will call to schedule your next home visit. Contact information: [address] [place] [address] [phone]         [facility]. Go to.   Specialty: Neurology Contact information: [address] [place]  [address] [phone]               Allergies  Allergen Reactions   Celecoxib Shortness Of  Breath   Dexlansoprazole  Anaphylaxis and Other (See Comments)    abd pain   Famotidine Other (See Comments)    Makes her feel very bad    Consultations: Neurology   Procedures/Studies: MR ANGIO HEAD WO CONTRAST Result Date: 01/07/2024 CLINICAL DATA:  Stroke, follow-up EXAM: MRA HEAD WITHOUT CONTRAST TECHNIQUE: Angiographic images of the Circle of Willis were acquired using MRA technique without intravenous contrast. COMPARISON:  No prior MRA a available, correlation is made with 01/06/2024 MRI head FINDINGS: Anterior circulation: Both internal carotid arteries are patent to the termini, with mild stenosis in the distal left supraclinoid ICA (series 2, image 81). A1 segments patent, with moderate stenosis in the distal right A1 (series 2, image 94). Normal anterior communicating artery. Anterior cerebral arteries are patent to their distal aspects without significant stenosis. Moderate stenosis in the right M1 (series 2, image 86). Severe stenosis in the left M1 (series 2, image 103). Distal MCA branches perfused to their distal aspects without significant stenosis. Posterior circulation: Vertebral arteries patent to the vertebrobasilar junction without stenosis. Posterior inferior cerebral artery is patent on the right, with severe stenosis distally (series 2, image 28). The left is not visualized. Basilar patent to its distal aspect. Severe stenosis at the origin right superior cerebellar artery. The left superior cerebellar artery is patent proximally. Patent P1 segments. Multifocal severe stenosis in the proximal left P 2 (series 2, images 83 and 89. Additional severe stenosis in the bilateral P3 branches (series 2, images 91, 96-97). The bilateral posterior communicating arteries are not visualized. Anatomic variants: None significant IMPRESSION: 1. Severe stenosis in the left M1, distal right PICA, at the origin of the right superior cerebellar artery, in the proximal left P2, and bilateral P3  branches. 2. Moderate stenosis in the distal right A1 and right M1. Mild stenosis in the distal left supraclinoid ICA. Electronically Signed   By: Donald Campion M.D.   On: 01/07/2024 17:43   US  Carotid Bilateral (at [facility] and [facility] only) Result Date: 01/07/2024 CLINICAL DATA:  TIA. EXAM: BILATERAL CAROTID DUPLEX ULTRASOUND TECHNIQUE: Elnor scale imaging, color Doppler and duplex ultrasound were performed of bilateral carotid and vertebral arteries in the neck. COMPARISON:  None Available. FINDINGS: Criteria: Quantification of carotid stenosis is based on velocity parameters that correlate the residual internal carotid diameter with NASCET-based stenosis levels, using the diameter of the distal internal carotid lumen as the denominator for stenosis measurement. The following velocity measurements were obtained: RIGHT ICA:  124/18 distal, 113/20 proximal cm/sec CCA:  81/12 cm/sec SYSTOLIC ICA/CCA RATIO:  1.5 ECA:  71 cm/sec LEFT ICA:  116/22 distal, 65/11 proximal cm/sec CCA:  76/9 cm/sec SYSTOLIC ICA/CCA RATIO:  1.7 ECA:  63 cm/sec RIGHT CAROTID ARTERY: Mild calcified and noncalcified plaque at the level of the carotid bulb and moderate predominately calcified plaque at the level of the proximal right ICA. Estimated right ICA stenosis is  less than 50%. RIGHT VERTEBRAL ARTERY: Antegrade flow with normal waveform and velocity. LEFT CAROTID ARTERY: Minimal plaque at the level of the left carotid bulb and mild to moderate calcified plaque in the proximal left ICA. Estimated left ICA stenosis is less than 50%. LEFT VERTEBRAL ARTERY: Antegrade flow with normal waveform and velocity. IMPRESSION: Bilateral carotid plaque, right greater than left. Estimated bilateral ICA stenosis is less than 50%. Electronically Signed   By: Marcey Moan M.D.   On: 01/07/2024 12:38   ECHOCARDIOGRAM COMPLETE Result Date: 01/07/2024    ECHOCARDIOGRAM REPORT   Patient Name:   LUSIA GREIS Date of Exam: 01/07/2024 Medical Rec #:  [id number]       Height:       61.0 in Accession #:    [id number]     Weight:       105.6 lb Date of Birth:  [birth year]      BSA:          1.440 m Patient Age:    89 years       BP:           168/83 mmHg Patient Gender: F              HR:           74 bpm. Exam Location:  [facility] Procedure: 2D Echo, Cardiac Doppler and Color Doppler Indications:    TIA  History:        Patient has prior history of Echocardiogram examinations, most                 recent 06/07/2020. TIA and COPD, Signs/Symptoms:Syncope; Risk                 Factors:Hypertension and Dyslipidemia.  Sonographer:    Juanita Shaw Referring Phys: [id number] EJIROGHENE E EMOKPAE IMPRESSIONS  1. Left ventricular ejection fraction, by estimation, is 65 to 70%. The left ventricle has normal function. The left ventricle has no regional wall motion abnormalities. Left ventricular diastolic parameters are consistent with Grade I diastolic dysfunction (impaired relaxation).  2. Right ventricular systolic function is normal. The right ventricular size is normal.  3. The mitral valve is abnormal. Mild mitral valve regurgitation. No evidence of mitral stenosis.  4. The aortic valve is tricuspid. There is mild calcification of the aortic valve. There is mild thickening of the aortic valve. Aortic valve regurgitation is not visualized. No aortic stenosis is present. FINDINGS  Left Ventricle: Left ventricular ejection fraction, by estimation, is 65 to 70%. The left ventricle has normal function. The left ventricle has no regional wall motion abnormalities. The left ventricular internal cavity size was normal in size. There is  no left ventricular hypertrophy. Left ventricular diastolic parameters are consistent with Grade I diastolic dysfunction (impaired relaxation). Normal left ventricular filling pressure. Right Ventricle: The right ventricular size is normal. Right vetricular wall thickness was not well visualized. Right ventricular systolic function is normal. Left Atrium: Left atrial  size was normal in size. Right Atrium: Right atrial size was normal in size. Pericardium: There is no evidence of pericardial effusion. Mitral Valve: The mitral valve is abnormal. Mild mitral valve regurgitation. No evidence of mitral valve stenosis. MV peak gradient, 3.3 mmHg. The mean mitral valve gradient is 1.0 mmHg. Tricuspid Valve: The tricuspid valve is normal in structure. Tricuspid valve regurgitation is not demonstrated. No evidence of tricuspid stenosis. Aortic Valve: The aortic valve is tricuspid. There is mild calcification of the aortic valve. There  is mild thickening of the aortic valve. There is mild aortic valve annular calcification. Aortic valve regurgitation is not visualized. No aortic stenosis  is present. Aortic valve mean gradient measures 1.0 mmHg. Aortic valve peak gradient measures 2.7 mmHg. Aortic valve area, by VTI measures 2.94 cm. Pulmonic Valve: The pulmonic valve was not well visualized. Pulmonic valve regurgitation is not visualized. No evidence of pulmonic stenosis. Aorta: The aortic root and ascending aorta are structurally normal, with no evidence of dilitation. Venous: The inferior vena cava was not well visualized. IAS/Shunts: No atrial level shunt detected by color flow Doppler.  LEFT VENTRICLE PLAX 2D LVIDd:         4.10 cm     Diastology LVIDs:         2.80 cm     LV e' medial:    5.77 cm/s LV PW:         0.70 cm     LV E/e' medial:  10.7 LV IVS:        0.80 cm     LV e' lateral:   4.79 cm/s LVOT diam:     2.00 cm     LV E/e' lateral: 12.9 LV SV:         46 LV SV Index:   32 LVOT Area:     3.14 cm  LV Volumes (MOD) LV vol d, MOD A2C: 93.3 ml LV vol d, MOD A4C: 81.8 ml LV vol s, MOD A2C: 32.4 ml LV vol s, MOD A4C: 39.5 ml LV SV MOD A2C:     60.9 ml LV SV MOD A4C:     81.8 ml LV SV MOD BP:      50.9 ml RIGHT VENTRICLE             IVC RV Basal diam:  3.20 cm     IVC diam: 0.70 cm RV Mid diam:    2.20 cm RV S prime:     10.30 cm/s TAPSE (M-mode): 2.3 cm LEFT ATRIUM            Index        RIGHT ATRIUM          Index LA diam:      2.90 cm 2.01 cm/m   RA Area:     9.07 cm LA Vol (A2C): 30.6 ml 21.25 ml/m  RA Volume:   18.70 ml 12.99 ml/m LA Vol (A4C): 27.4 ml 19.03 ml/m  AORTIC VALVE                    PULMONIC VALVE AV Area (Vmax):    3.21 cm     PV Vmax:       0.71 m/s AV Area (Vmean):   2.43 cm     PV Peak grad:  2.0 mmHg AV Area (VTI):     2.94 cm AV Vmax:           82.50 cm/s AV Vmean:          56.000 cm/s AV VTI:            0.157 m AV Peak Grad:      2.7 mmHg AV Mean Grad:      1.0 mmHg LVOT Vmax:         84.20 cm/s LVOT Vmean:        43.400 cm/s LVOT VTI:          0.147 m LVOT/AV VTI ratio: 0.94  AORTA Ao Root diam: 3.20  cm Ao Asc diam:  2.90 cm MITRAL VALVE MV Area (PHT): 2.49 cm    SHUNTS MV Area VTI:   2.46 cm    Systemic VTI:  0.15 m MV Peak grad:  3.3 mmHg    Systemic Diam: 2.00 cm MV Mean grad:  1.0 mmHg MV Vmax:       0.90 m/s MV Vmean:      54.4 cm/s MV Decel Time: 305 msec MV E velocity: 62.00 cm/s MV A velocity: 93.80 cm/s MV E/A ratio:  0.66 Dorn Ross MD Electronically signed by Dorn Ross MD Signature Date/Time: 01/07/2024/12:20:54 PM    Final    MR BRAIN WO CONTRAST Result Date: 01/06/2024 CLINICAL DATA:  Transient ischemic attack.  Confusion.  Headache. EXAM: MRI HEAD WITHOUT CONTRAST TECHNIQUE: Multiplanar, multiecho pulse sequences of the brain and surrounding structures were obtained without intravenous contrast. COMPARISON:  Head CT 12/09/2023.  MRI 07/09/2016. FINDINGS: Brain: Diffusion imaging does not show any acute or subacute infarction or other cause of restricted diffusion. Chronic small-vessel ischemic changes are present throughout the pons. No focal cerebellar finding. Cerebral hemispheres show an old lacunar infarction in the right basal ganglia/external capsule. Chronic small-vessel ischemic changes are present within the thalami, basal ganglia and throughout the cerebral hemispheric deep white matter. No large vessel territory  stroke. No mass lesion, recent hemorrhage, hydrocephalus or extra-axial collection. Some hemosiderin deposition associated with the small vessel insults in the right basal ganglia region. Vascular: Major vessels at the base of the brain show flow. Skull and upper cervical spine: Negative Sinuses/Orbits: Mild mucosal thickening. No advanced sinusitis. Orbits negative. Other: None IMPRESSION: No acute or reversible finding. Chronic small-vessel ischemic changes throughout the pons, thalami, basal ganglia and cerebral hemispheric deep white matter. Old lacunar infarction in the right basal ganglia/external capsule. Electronically Signed   By: Oneil Officer M.D.   On: 01/06/2024 16:47   DG Chest Portable 1 View Result Date: 01/06/2024 CLINICAL DATA:  Altered mental status. EXAM: PORTABLE CHEST 1 VIEW COMPARISON:  12/09/2023 FINDINGS: The heart size and mediastinal contours are within normal limits. Pulmonary hyperinflation again seen, consistent with COPD. Both lungs are clear. Bilateral shoulder prostheses and superior mediastinal surgical clips again noted. IMPRESSION: COPD. No active disease. Electronically Signed   By: Norleen DELENA Kil M.D.   On: 01/06/2024 14:57   CT Head Wo Contrast Result Date: 12/09/2023 CLINICAL DATA:  Sudden onset severe headache. EXAM: CT HEAD WITHOUT CONTRAST TECHNIQUE: Contiguous axial images were obtained from the base of the skull through the vertex without intravenous contrast. RADIATION DOSE REDUCTION: This exam was performed according to the departmental dose-optimization program which includes automated exposure control, adjustment of the mA and/or kV according to patient size and/or use of iterative reconstruction technique. COMPARISON:  CT head dated December 16, 2021. FINDINGS: Brain: No evidence of acute infarction, hemorrhage, hydrocephalus, extra-axial collection or mass lesion/mass effect. Stable atrophy and chronic microvascular ischemic changes. Vascular: Calcified  atherosclerosis at the skull base. No hyperdense vessel. Skull: Normal. Negative for fracture or focal lesion. Sinuses/Orbits: Scattered mild paranasal sinus mucosal thickening with small retention cysts. No air-fluid levels. The orbits are unremarkable. Other: None. IMPRESSION: 1. No acute intracranial abnormality. 2. Stable atrophy and chronic microvascular ischemic changes. Electronically Signed   By: Elsie ONEIDA Shoulder M.D.   On: 12/09/2023 10:25   DG Chest Portable 1 View Result Date: 12/09/2023 CLINICAL DATA:  ?pna EXAM: PORTABLE CHEST 1 VIEW COMPARISON:  Chest x-ray 08/15/2023, CT chest 08/02/2012 FINDINGS: The heart and mediastinal  contours are unchanged. Aortic calcification. Hyperinflation of the lungs. Interval development of biapical airspace opacities overlying biapical pleural/pulmonary scarring. Interval development of left base airspace opacity. Coarsened interstitial markings and cystic changes with no overt pulmonary edema. No pleural effusion. No pneumothorax. No acute osseous abnormality. Total bilateral shoulder arthroplasty. . IMPRESSION: 1. Interval development of biapical airspace opacity overlying biapical pleural/pulmonary scarring. Interval development of left base airspace opacity. Finding may represent combination of infection/inflammation versus atelectasis. Followup PA and lateral chest X-ray is recommended in 3-4 weeks following therapy to ensure resolution. 2. Aortic Atherosclerosis (ICD10-I70.0) and Emphysema (ICD10-J43.9). Electronically Signed   By: Morgane  Naveau M.D.   On: 12/09/2023 09:05     Discharge Exam: Vitals:   01/07/24 1048 01/07/24 1615  BP: (!) 168/83 (!) 168/82  Pulse: 77 81  Resp:  17  Temp: (!) 97.5 F (36.4 C) 97.8 F (36.6 C)  SpO2: 99% 100%   Vitals:   01/07/24 0654 01/07/24 0731 01/07/24 1048 01/07/24 1615  BP: (!) 183/76 (!) 157/79 (!) 168/83 (!) 168/82  Pulse: 72 70 77 81  Resp: 17   17  Temp: (!) 97.5 F (36.4 C)  (!) 97.5 F (36.4 C)  97.8 F (36.6 C)  TempSrc: Oral  Oral Oral  SpO2: 99% 100% 99% 100%  Weight:      Height:        General: Pt is alert, awake, not in acute distress Cardiovascular: RRR, S1/S2 +, no rubs, no gallops Respiratory: CTA bilaterally, no wheezing, no rhonchi Abdominal: Soft, NT, ND, bowel sounds + Extremities: no edema, no cyanosis    The results of significant diagnostics from this hospitalization (including imaging, microbiology, ancillary and laboratory) are listed below for reference.     Microbiology: No results found for this or any previous visit (from the past 240 hours).   Labs: BNP (last 3 results) No results for input(s): BNP in the last 8760 hours. Basic Metabolic Panel: Recent Labs  Lab 01/06/24 1405  NA 139  K 3.4*  CL 103  CO2 29  GLUCOSE 99  BUN 19  CREATININE 0.97  CALCIUM  8.9   Liver Function Tests: Recent Labs  Lab 01/06/24 1405  AST 19  ALT 13  ALKPHOS 68  BILITOT 0.3  PROT 7.0  ALBUMIN 3.5   No results for input(s): LIPASE, AMYLASE in the last 168 hours. No results for input(s): AMMONIA in the last 168 hours. CBC: Recent Labs  Lab 01/06/24 1405  WBC 5.7  NEUTROABS 3.4  HGB 11.0*  HCT 34.5*  MCV 93.0  PLT 162   Cardiac Enzymes: No results for input(s): CKTOTAL, CKMB, CKMBINDEX, TROPONINI in the last 168 hours. BNP: Invalid input(s): POCBNP CBG: No results for input(s): GLUCAP in the last 168 hours. D-Dimer No results for input(s): DDIMER in the last 72 hours. Hgb A1c No results for input(s): HGBA1C in the last 72 hours. Lipid Profile Recent Labs    01/07/24 0412  CHOL 154  HDL 57  LDLCALC 87  TRIG 51  CHOLHDL 2.7   Thyroid  function studies Recent Labs    01/07/24 0412  TSH 12.082*   Anemia work up Recent Labs    01/07/24 0412  VITAMINB12 453  FOLATE 14.7   Urinalysis    Component Value Date/Time   COLORURINE YELLOW 01/06/2024 1627   APPEARANCEUR CLEAR 01/06/2024 1627   LABSPEC  1.013 01/06/2024 1627   PHURINE 6.0 01/06/2024 1627   GLUCOSEU NEGATIVE 01/06/2024 1627   HGBUR NEGATIVE 01/06/2024 1627  BILIRUBINUR NEGATIVE 01/06/2024 1627   BILIRUBINUR negative 12/14/2023 1454   KETONESUR NEGATIVE 01/06/2024 1627   PROTEINUR NEGATIVE 01/06/2024 1627   UROBILINOGEN 0.2 12/14/2023 1454   UROBILINOGEN 0.2 08/02/2012 1603   NITRITE NEGATIVE 01/06/2024 1627   LEUKOCYTESUR NEGATIVE 01/06/2024 1627   Sepsis Labs Recent Labs  Lab 01/06/24 1405  WBC 5.7   Microbiology No results found for this or any previous visit (from the past 240 hours).   Time coordinating discharge: 35 minutes  SIGNED:   Adron JONETTA Fairly, DO [facility] 01/07/2024, 5:53 PM  If 7PM-7AM, please contact night-coverage [email or web address]

## 2024-01-07 NOTE — Progress Notes (Signed)
 Initial Nutrition Assessment  DOCUMENTATION CODES:   Severe malnutrition in context of chronic illness  INTERVENTION:   Dysphagia 1 diet (pureed) with thin liquids per patient preference/tolerance.  Boost Breeze po TID, each supplement provides 250 kcal and 9 grams of protein. Magic cup TID with meals, each supplement provides 290 kcal and 9 grams of protein. MVI with minerals daily.  NUTRITION DIAGNOSIS:   Severe Malnutrition related to chronic illness (COPD) as evidenced by severe muscle depletion, severe fat depletion.  GOAL:   Patient will meet greater than or equal to 90% of their needs  MONITOR:   PO intake, Supplement acceptance  REASON FOR ASSESSMENT:   Malnutrition Screening Tool    ASSESSMENT:   89 yo female admitted with TIA. PMH includes asthma, COPD, GERD, HLD, anemia, seizures, thyroidectomy, HTN.  Spoke with patient and her son at bedside. Patient does not have any teeth, but tolerates pureed foods well at home. She received regular diet for lunch and couldn't eat much of it. RD changed diet to dysphagia 1 (pureed) with thin liquids per patient preference. She ate a few bites of her pureed lunch, but her IV was bothering her, so she stopped eating. She does not like the taste of milky supplements, but likes the fruity ones. She also agreed to try magic cups.    Labs reviewed. K 3.4 (L), TSH 12.082 (H), T4 free 1.2 (H)  Medications reviewed and include synthroid , miralax , senokot, protonix .  Weight history reviewed. Weight has been gradually trending up over the past year. Synthroid  dosing has been changed / reduced and should help promote weight gain.   Patient meets criteria for severe malnutrition, given severe depletion of muscle and subcutaneous fat mass.  NUTRITION - FOCUSED PHYSICAL EXAM:  Flowsheet Row Most Recent Value  Orbital Region Severe depletion  Upper Arm Region Severe depletion  Thoracic and Lumbar Region Severe depletion  Buccal Region  Severe depletion  Temple Region Severe depletion  Clavicle Bone Region Severe depletion  Clavicle and Acromion Bone Region Severe depletion  Scapular Bone Region Severe depletion  Dorsal Hand Severe depletion  Patellar Region Severe depletion  Anterior Thigh Region Severe depletion  Posterior Calf Region Severe depletion  Edema (RD Assessment) None  Hair Reviewed  Eyes Reviewed  Mouth Reviewed (edentulous)  Skin Reviewed  Nails Reviewed       Diet Order:   Diet Order             DIET - DYS 1 Room service appropriate? Yes with Assist; Fluid consistency: Thin  Diet effective now                   EDUCATION NEEDS:   No education needs have been identified at this time  Skin:  Skin Assessment: Reviewed RN Assessment  Last BM:  unknown  Height:   Ht Readings from Last 1 Encounters:  01/06/24 5' 1 (1.549 m)    Weight:   Wt Readings from Last 1 Encounters:  01/06/24 47.9 kg    Ideal Body Weight:  47.7 kg  BMI:  Body mass index is 19.95 kg/m.  Estimated Nutritional Needs:   Kcal:  1300-1500  Protein:  60-75 gm  Fluid:  1.3-1.5 L   Suzen HUNT RD, LDN, CNSC Contact Inpatient RD using Secure Chat. If unavailable, use group chat RD Inpatient via Secure Chat in EP[facility].

## 2024-01-07 NOTE — TOC Initial Note (Addendum)
 Transition of Care [facility]) - Initial/Assessment Note    Patient Details  Name: Christine Leblanc MRN: [id number] Date of Birth: 1929-02-09  Transition of Care [facility] CM/SW Contact:    Sharlyne Stabs, RN Phone Number: 01/07/2024, 3:07 PM  Clinical Narrative:          Patient admitted in OBS with TIA. Patient lives at home with children and is active with [facility] HHPT/SW/Aide.  They wish to continue this services and are need per PT evaluation. TOC follow, will need new orders if patient changes to inpatient.    Expected Discharge Plan: Home w Home Health Services Barriers to Discharge: Continued Medical Work up   Patient Goals and CMS Choice Patient states their goals for this hospitalization and ongoing recovery are:: to return home. CMS Medicare.gov Compare Post Acute Care list provided to:: Patient Represenative (must comment) Choice offered to / list presented to : Adult Children     Expected Discharge Plan and Services      Living arrangements for the past 2 months: Single Family Home                   Prior Living Arrangements/Services Living arrangements for the past 2 months: Single Family Home Lives with:: Adult Children Patient language and need for interpreter reviewed:: Yes        Need for Family Participation in Patient Care: Yes (Comment) Care giver support system in place?: Yes (comment) Current home services: DME Criminal Activity/Legal Involvement Pertinent to Current Situation/Hospitalization: No - Comment as needed  Activities of Daily Living   ADL Screening (condition at time of admission) Independently performs ADLs?: No Does the patient have a NEW difficulty with bathing/dressing/toileting/self-feeding that is expected to last >3 days?: No Does the patient have a NEW difficulty with getting in/out of bed, walking, or climbing stairs that is expected to last >3 days?: No Does the patient have a NEW difficulty with communication that is expected to last >3  days?: No Is the patient deaf or have difficulty hearing?: No Does the patient have difficulty seeing, even when wearing glasses/contacts?: No Does the patient have difficulty concentrating, remembering, or making decisions?: No  Permission Sought/Granted     Emotional Assessment    Affect (typically observed): Accepting Orientation: : Oriented to Self, Oriented to Place, Oriented to  Time, Oriented to Situation Alcohol  / Substance Use: Not Applicable Psych Involvement: No (comment)  Admission diagnosis:  TIA (transient ischemic attack) [G45.9] Patient Active Problem List   Diagnosis Date Noted   TIA (transient ischemic attack) 01/06/2024   SIRS (systemic inflammatory response syndrome) (HCC) 12/09/2023   CAP (community acquired pneumonia) 12/09/2023   Chronic cough 10/03/2023   Dysphagia 08/15/2023   Protein-calorie malnutrition, mild (HCC) 06/22/2022   Acute bronchitis 06/22/2022   Iron deficiency anemia 06/22/2022   COPD (chronic obstructive pulmonary disease) (HCC) 06/22/2022   Loss of weight 10/09/2021   Pharyngeal dysphagia 10/09/2021   Mild carotid artery disease (HCC) 05/19/2020   History of TIA (transient ischemic attack) 12/25/2018   Generalized weakness 09/30/2018   MGUS (monoclonal gammopathy of unknown significance) 10/21/2017   Primary localized osteoarthrosis of right shoulder 06/21/2015   S/P shoulder replacement 06/21/2015   Oropharyngeal dysphagia 11/29/2014   Osteoarthritis of right knee 07/27/2014   Postsurgical hypothyroidism 04/22/2014   Hypothyroidism 10/12/2013   Syncope and collapse    Anemia, normocytic normochromic 04/26/2012   Gastroesophageal reflux disease    Essential hypertension 12/07/2011   Leg pain, bilateral 04/27/2011   Dyslipidemia  04/27/2011   Other dysphagia 06/07/2010   PCP:  Shona Norleen PEDLAR, MD Pharmacy:   [facility] [id number] - [place], [place] - [address] AT [facility] [address] [place]  [place] [address] Phone: [phone] Fax: [phone]  [facility] - [place], [place] - [address] [place] [address] Phone: [phone] Fax: [phone]    Social Drivers of Health (SDOH) Social History: SDOH Screenings   Food Insecurity: No Food Insecurity (01/07/2024)  Housing: Unknown (01/07/2024)  Transportation Needs: No Transportation Needs (01/07/2024)  Utilities: Not At Risk (01/07/2024)  Tobacco Use: Medium Risk (01/06/2024)   SDOH Interventions:    Readmission Risk Interventions    12/10/2023   11:57 AM  Readmission Risk Prevention Plan  Transportation Screening Complete  HRI or Home Care Consult Complete  Social Work Consult for Recovery Care Planning/Counseling Complete  Palliative Care Screening Not Applicable  Medication Review Oceanographer) Complete

## 2024-01-07 NOTE — Care Management Obs Status (Addendum)
 MEDICARE OBSERVATION STATUS NOTIFICATION   Patient Details  Name: Christine Leblanc MRN: [id number] Date of Birth: [birth year]   Medicare Observation Status Notification Given:  Yes MOSETTA Border M.,CMA, verbally reviewed observation notice with [facility] of [place] telephonically at [phone]. Consent for verbal signature provided to Chrishelle Zito M., CMA.) Copy of letter left in room and c copy of letter mailed certified to [address] [place], [place], [place] [address]   Border LITTIE Ada 01/07/2024, 9:35 AM

## 2024-01-07 NOTE — Plan of Care (Signed)

## 2024-01-07 NOTE — Progress Notes (Signed)
 Went over discharge instructions w/ pt and pt son [name].

## 2024-01-07 NOTE — Plan of Care (Signed)
  Problem: Acute Rehab PT Goals(only PT should resolve) Goal: Pt Will Go Supine/Side To Sit Outcome: Progressing Flowsheets (Taken 01/07/2024 1504) Pt will go Supine/Side to Sit:  with modified independence  with supervision Goal: Patient Will Transfer Sit To/From Stand Outcome: Progressing Flowsheets (Taken 01/07/2024 1504) Patient will transfer sit to/from stand:  with modified independence  with supervision Goal: Pt Will Transfer Bed To Chair/Chair To Bed Outcome: Progressing Flowsheets (Taken 01/07/2024 1504) Pt will Transfer Bed to Chair/Chair to Bed:  with modified independence  with supervision Goal: Pt Will Ambulate Outcome: Progressing Flowsheets (Taken 01/07/2024 1504) Pt will Ambulate:  75 feet  with supervision  with modified independence  with rolling walker   3:05 PM, 01/07/24 Lynwood Music, MPT Physical Therapist with [facility] [phone] office [phone] mobile phone

## 2024-01-08 LAB — HEMOGLOBIN A1C
Hgb A1c MFr Bld: 5.4 % (ref 4.8–5.6)
Mean Plasma Glucose: 108 mg/dL

## 2024-01-09 DIAGNOSIS — J439 Emphysema, unspecified: Secondary | ICD-10-CM | POA: Diagnosis not present

## 2024-01-09 DIAGNOSIS — I10 Essential (primary) hypertension: Secondary | ICD-10-CM | POA: Diagnosis not present

## 2024-01-09 DIAGNOSIS — D509 Iron deficiency anemia, unspecified: Secondary | ICD-10-CM | POA: Diagnosis not present

## 2024-01-09 DIAGNOSIS — J440 Chronic obstructive pulmonary disease with acute lower respiratory infection: Secondary | ICD-10-CM | POA: Diagnosis not present

## 2024-01-09 DIAGNOSIS — I700 Atherosclerosis of aorta: Secondary | ICD-10-CM | POA: Diagnosis not present

## 2024-01-09 DIAGNOSIS — J189 Pneumonia, unspecified organism: Secondary | ICD-10-CM | POA: Diagnosis not present

## 2024-01-14 DIAGNOSIS — J302 Other seasonal allergic rhinitis: Secondary | ICD-10-CM | POA: Diagnosis not present

## 2024-01-14 DIAGNOSIS — U071 COVID-19: Secondary | ICD-10-CM | POA: Diagnosis not present

## 2024-01-15 ENCOUNTER — Other Ambulatory Visit: Payer: Self-pay | Admitting: Allergy & Immunology

## 2024-01-15 DIAGNOSIS — E039 Hypothyroidism, unspecified: Secondary | ICD-10-CM | POA: Diagnosis not present

## 2024-01-15 DIAGNOSIS — Z8673 Personal history of transient ischemic attack (TIA), and cerebral infarction without residual deficits: Secondary | ICD-10-CM | POA: Diagnosis not present

## 2024-01-15 DIAGNOSIS — J439 Emphysema, unspecified: Secondary | ICD-10-CM | POA: Diagnosis not present

## 2024-01-15 DIAGNOSIS — Z87891 Personal history of nicotine dependence: Secondary | ICD-10-CM | POA: Diagnosis not present

## 2024-01-15 DIAGNOSIS — E785 Hyperlipidemia, unspecified: Secondary | ICD-10-CM | POA: Diagnosis not present

## 2024-01-15 DIAGNOSIS — Z85828 Personal history of other malignant neoplasm of skin: Secondary | ICD-10-CM | POA: Diagnosis not present

## 2024-01-15 DIAGNOSIS — M19012 Primary osteoarthritis, left shoulder: Secondary | ICD-10-CM | POA: Diagnosis not present

## 2024-01-15 DIAGNOSIS — Z7982 Long term (current) use of aspirin: Secondary | ICD-10-CM | POA: Diagnosis not present

## 2024-01-15 DIAGNOSIS — I700 Atherosclerosis of aorta: Secondary | ICD-10-CM | POA: Diagnosis not present

## 2024-01-15 DIAGNOSIS — K219 Gastro-esophageal reflux disease without esophagitis: Secondary | ICD-10-CM | POA: Diagnosis not present

## 2024-01-15 DIAGNOSIS — M19042 Primary osteoarthritis, left hand: Secondary | ICD-10-CM | POA: Diagnosis not present

## 2024-01-15 DIAGNOSIS — D509 Iron deficiency anemia, unspecified: Secondary | ICD-10-CM | POA: Diagnosis not present

## 2024-01-15 DIAGNOSIS — Z96653 Presence of artificial knee joint, bilateral: Secondary | ICD-10-CM | POA: Diagnosis not present

## 2024-01-15 DIAGNOSIS — Z79899 Other long term (current) drug therapy: Secondary | ICD-10-CM | POA: Diagnosis not present

## 2024-01-15 DIAGNOSIS — G319 Degenerative disease of nervous system, unspecified: Secondary | ICD-10-CM | POA: Diagnosis not present

## 2024-01-15 DIAGNOSIS — J189 Pneumonia, unspecified organism: Secondary | ICD-10-CM | POA: Diagnosis not present

## 2024-01-15 DIAGNOSIS — M47812 Spondylosis without myelopathy or radiculopathy, cervical region: Secondary | ICD-10-CM | POA: Diagnosis not present

## 2024-01-15 DIAGNOSIS — E042 Nontoxic multinodular goiter: Secondary | ICD-10-CM | POA: Diagnosis not present

## 2024-01-15 DIAGNOSIS — M19041 Primary osteoarthritis, right hand: Secondary | ICD-10-CM | POA: Diagnosis not present

## 2024-01-15 DIAGNOSIS — Z9181 History of falling: Secondary | ICD-10-CM | POA: Diagnosis not present

## 2024-01-15 DIAGNOSIS — Z8701 Personal history of pneumonia (recurrent): Secondary | ICD-10-CM | POA: Diagnosis not present

## 2024-01-15 DIAGNOSIS — Z96611 Presence of right artificial shoulder joint: Secondary | ICD-10-CM | POA: Diagnosis not present

## 2024-01-15 DIAGNOSIS — R1310 Dysphagia, unspecified: Secondary | ICD-10-CM | POA: Diagnosis not present

## 2024-01-15 DIAGNOSIS — I10 Essential (primary) hypertension: Secondary | ICD-10-CM | POA: Diagnosis not present

## 2024-01-15 DIAGNOSIS — J440 Chronic obstructive pulmonary disease with acute lower respiratory infection: Secondary | ICD-10-CM | POA: Diagnosis not present

## 2024-01-20 ENCOUNTER — Emergency Department (HOSPITAL_COMMUNITY)
Admission: EM | Admit: 2024-01-20 | Discharge: 2024-01-20 | Disposition: A | Discharge status: Home / Self Care | Payer: Medicare Other | Attending: Emergency Medicine | Admitting: Emergency Medicine

## 2024-01-20 ENCOUNTER — Emergency Department (HOSPITAL_COMMUNITY): Payer: Medicare Other

## 2024-01-20 ENCOUNTER — Encounter (HOSPITAL_COMMUNITY): Payer: Self-pay

## 2024-01-20 ENCOUNTER — Other Ambulatory Visit: Payer: Self-pay

## 2024-01-20 DIAGNOSIS — J449 Chronic obstructive pulmonary disease, unspecified: Secondary | ICD-10-CM | POA: Insufficient documentation

## 2024-01-20 DIAGNOSIS — R1310 Dysphagia, unspecified: Secondary | ICD-10-CM | POA: Diagnosis not present

## 2024-01-20 DIAGNOSIS — Z96611 Presence of right artificial shoulder joint: Secondary | ICD-10-CM | POA: Diagnosis not present

## 2024-01-20 DIAGNOSIS — I10 Essential (primary) hypertension: Secondary | ICD-10-CM | POA: Diagnosis not present

## 2024-01-20 DIAGNOSIS — Z79899 Other long term (current) drug therapy: Secondary | ICD-10-CM | POA: Insufficient documentation

## 2024-01-20 DIAGNOSIS — I700 Atherosclerosis of aorta: Secondary | ICD-10-CM | POA: Diagnosis not present

## 2024-01-20 DIAGNOSIS — Z96612 Presence of left artificial shoulder joint: Secondary | ICD-10-CM | POA: Diagnosis not present

## 2024-01-20 DIAGNOSIS — Z7982 Long term (current) use of aspirin: Secondary | ICD-10-CM | POA: Insufficient documentation

## 2024-01-20 DIAGNOSIS — M5030 Other cervical disc degeneration, unspecified cervical region: Secondary | ICD-10-CM | POA: Diagnosis not present

## 2024-01-20 LAB — CBC WITH DIFFERENTIAL/PLATELET
Abs Immature Granulocytes: 0.07 10*3/uL (ref 0.00–0.07)
Basophils Absolute: 0 10*3/uL (ref 0.0–0.1)
Basophils Relative: 0 %
Eosinophils Absolute: 0.2 10*3/uL (ref 0.0–0.5)
Eosinophils Relative: 3 %
HCT: 35.1 % — ABNORMAL LOW (ref 36.0–46.0)
Hemoglobin: 11.4 g/dL — ABNORMAL LOW (ref 12.0–15.0)
Immature Granulocytes: 1 %
Lymphocytes Relative: 15 %
Lymphs Abs: 1.2 10*3/uL (ref 0.7–4.0)
MCH: 29.5 pg (ref 26.0–34.0)
MCHC: 32.5 g/dL (ref 30.0–36.0)
MCV: 90.7 fL (ref 80.0–100.0)
Monocytes Absolute: 0.7 10*3/uL (ref 0.1–1.0)
Monocytes Relative: 9 %
Neutro Abs: 5.6 10*3/uL (ref 1.7–7.7)
Neutrophils Relative %: 72 %
Platelets: 195 10*3/uL (ref 150–400)
RBC: 3.87 MIL/uL (ref 3.87–5.11)
RDW: 14.4 % (ref 11.5–15.5)
WBC: 7.7 10*3/uL (ref 4.0–10.5)
nRBC: 0 % (ref 0.0–0.2)

## 2024-01-20 LAB — BASIC METABOLIC PANEL
Anion gap: 10 (ref 5–15)
BUN: 15 mg/dL (ref 8–23)
CO2: 26 mmol/L (ref 22–32)
Calcium: 8.7 mg/dL — ABNORMAL LOW (ref 8.9–10.3)
Chloride: 100 mmol/L (ref 98–111)
Creatinine, Ser: 1.03 mg/dL — ABNORMAL HIGH (ref 0.44–1.00)
GFR, Estimated: 51 mL/min — ABNORMAL LOW (ref >60)
Glucose, Bld: 114 mg/dL — ABNORMAL HIGH (ref 70–99)
Potassium: 3.5 mmol/L (ref 3.5–5.1)
Sodium: 136 mmol/L (ref 135–145)

## 2024-01-20 LAB — MAGNESIUM: Magnesium: 1.9 mg/dL (ref 1.7–2.4)

## 2024-01-20 NOTE — ED Triage Notes (Signed)
Pt arrived from home via POV accompanied by her son, who reports Pt has multiple complaints. Per Pts son, he is the primary caretaker for the Pt and his sister and lives at home with them. Per Pts son [name], everyone in their household has tested positive for Covid this past Monday. Per [name], symptoms are getting worse, dysphagia, acid reflux primarily.

## 2024-01-20 NOTE — ED Provider Notes (Signed)
[facility] EMERGENCY DEPARTMENT AT [facility] Provider Note   CSN: [id number] Arrival date & time: 01/20/24  1707     History {Add pertinent medical, surgical, social history, OB history to HPI:1} Chief Complaint  Patient presents with   Fatigue    Christine Leblanc is a 89 y.o. female.  89 yo history of COPD, hypertension, MGUS, seizures, esophageal web status post dilation, GERD, and oropharyngeal dysphagia who presents to the emergency department with difficulty swallowing.  Patient has had years of dysphagia.  Is currently recovering from COVID and her son said that she was complaining of more difficulty swallowing over the past 3 to 4 days than usual.  Typically just uses pured foods.  She feels like the food is difficult to swallow but is also getting stuck in the midportion of her throat.  No choking episodes.  No fever.  No shortness of breath.  Son said that he could not get her into an outpatient appointment so brought her here.  Last EGD was in 2016.  Seen by SLP who was supposed to come to the house but they have not arranged for this yet.  Is getting hospice to come to the house on the 23rd of this month.  Son thinks that she may have lost 2 to 3 pounds recently.       Home Medications Prior to Admission medications   Medication Sig Start Date End Date Taking? Authorizing Provider  aspirin EC 81 MG tablet Take 1 tablet (81 mg total) by mouth daily. Swallow whole. 01/08/24 04/07/24  Sherryll Burger, Pratik D, DO  atorvastatin (LIPITOR) 40 MG tablet Take 1 tablet (40 mg total) by mouth at bedtime. 01/08/24   Sherryll Burger, Pratik D, DO  Carbinoxamine Maleate 4 MG TABS Take 1 tablet (4 mg total) by mouth at bedtime. 01/02/23 01/06/24  Alfonse Spruce, MD  clopidogrel (PLAVIX) 75 MG tablet Take 1 tablet (75 mg total) by mouth daily. 01/08/24   Sherryll Burger, Pratik D, DO  diazepam (VALIUM) 5 MG tablet Take 2.5 mg by mouth daily as needed for anxiety. 05/29/23   [provider]  ferrous sulfate  325 (65 FE) MG tablet Take 325 mg by mouth. Takes one on Monday, Wednesday and Friday    [provider]  fluticasone (FLONASE) 50 MCG/ACT nasal spray Place 2 sprays into both nostrils daily. 12/02/23   Birder Robson, MD  ipratropium (ATROVENT) 0.06 % nasal spray Place 1 spray into both nostrils 4 (four) times daily as needed for rhinitis. 12/02/23   Birder Robson, MD  levothyroxine (SYNTHROID) 88 MCG tablet TAKE 1 TABLET DAILY ON MONDAY THROUGH SATURDAY AND ONE-HALF (1/2) TABLET ON SUNDAY Patient taking differently: Take 88 mcg by mouth See admin instructions. TAKE 1 TABLET DAILY ON MONDAY THROUGH SATURDAY AND ONE-HALF (1/2) TABLET ON SUNDAY 10/10/22   Reather Littler, MD  pantoprazole (PROTONIX) 40 MG tablet Take 1 tablet (40 mg total) by mouth 2 (two) times daily before a meal. 11/06/23   Letta Median, PA-C  senna (SENOKOT) 8.6 MG tablet Take 2 tablets by mouth at bedtime.    [provider]      Allergies    Celecoxib, Dexlansoprazole, and Famotidine    Review of Systems   Review of Systems  Physical Exam Updated Vital Signs BP 138/81 (BP Location: Right Arm)   Pulse 85   Temp 98.6 F (37 C) (Oral)   Resp 18   Ht 5\' 1"  (1.549 m)   Wt  47 kg   SpO2 100%   BMI 19.58 kg/m  Physical Exam Vitals and nursing note reviewed.  Constitutional:      General: She is not in acute distress.    Appearance: She is well-developed.  HENT:     Head: Normocephalic and atraumatic.     Right Ear: External ear normal.     Left Ear: External ear normal.     Nose: Nose normal.     Mouth/Throat:     Mouth: Mucous membranes are moist.     Pharynx: Oropharynx is clear. No oropharyngeal exudate or posterior oropharyngeal erythema.     Comments: Uvula midline Eyes:     Extraocular Movements: Extraocular movements intact.     Conjunctiva/sclera: Conjunctivae normal.     Pupils: Pupils are equal, round, and reactive to light.  Cardiovascular:     Rate and Rhythm: Normal rate and  regular rhythm.     Heart sounds: No murmur heard. Pulmonary:     Effort: Pulmonary effort is normal. No respiratory distress.     Breath sounds: Normal breath sounds.  Musculoskeletal:     Cervical back: Normal range of motion and neck supple.  Skin:    General: Skin is warm and dry.  Neurological:     Mental Status: She is alert and oriented to person, place, and time. Mental status is at baseline.  Psychiatric:        Mood and Affect: Mood normal.     ED Results / Procedures / Treatments   Labs (all labs ordered are listed, but only abnormal results are displayed) Labs Reviewed - No data to display  EKG None  Radiology No results found.  Procedures Procedures  {Document cardiac monitor, telemetry assessment procedure when appropriate:1}  Medications Ordered in ED Medications - No data to display  ED Course/ Medical Decision Making/ A&P   {   Click here for ABCD2, HEART and other calculatorsREFRESH Note before signing :1}                              Medical Decision Making Amount and/or Complexity of Data Reviewed Labs: ordered. Radiology: ordered.   ***  {Document critical care time when appropriate:1} {Document review of labs and clinical decision tools ie heart score, Chads2Vasc2 etc:1}  {Document your independent review of radiology images, and any outside records:1} {Document your discussion with family members, caretakers, and with consultants:1} {Document social determinants of health affecting pt's care:1} {Document your decision making why or why not admission, treatments were needed:1} Final Clinical Impression(s) / ED Diagnoses Final diagnoses:  None    Rx / DC Orders ED Discharge Orders     None

## 2024-01-20 NOTE — ED Notes (Signed)
Pts son also reports Pt has on-going constipation. Per [name], he has tried multiple laxatives and enemas at home w/o relief.

## 2024-01-20 NOTE — Discharge Instructions (Signed)
You were seen for your difficulty swallowing in the emergency department.   At home, please continue your pured foods.    Check your MyChart online for the results of any tests that had not resulted by the time you left the emergency department.   Follow-up with your primary doctor in 2-3 days regarding your visit.  Follow-up with GI about a possible repeat endoscopy.  Social work should be calling you about home health speech therapy.  Return immediately to the emergency department if you experience any of the following: Choking, fevers, difficulty breathing, or any other concerning symptoms.    Thank you for visiting our Emergency Department. It was a pleasure taking care of you today.

## 2024-01-21 ENCOUNTER — Ambulatory Visit: Payer: Medicare Other | Admitting: Adult Health

## 2024-01-21 ENCOUNTER — Encounter: Payer: Self-pay | Admitting: Hematology

## 2024-01-23 ENCOUNTER — Other Ambulatory Visit (HOSPITAL_COMMUNITY): Payer: Self-pay | Admitting: Nurse Practitioner

## 2024-01-23 DIAGNOSIS — I700 Atherosclerosis of aorta: Secondary | ICD-10-CM | POA: Diagnosis not present

## 2024-01-23 DIAGNOSIS — Z8701 Personal history of pneumonia (recurrent): Secondary | ICD-10-CM

## 2024-01-23 DIAGNOSIS — J189 Pneumonia, unspecified organism: Secondary | ICD-10-CM | POA: Diagnosis not present

## 2024-01-23 DIAGNOSIS — J440 Chronic obstructive pulmonary disease with acute lower respiratory infection: Secondary | ICD-10-CM | POA: Diagnosis not present

## 2024-01-23 DIAGNOSIS — J439 Emphysema, unspecified: Secondary | ICD-10-CM | POA: Diagnosis not present

## 2024-01-23 DIAGNOSIS — D509 Iron deficiency anemia, unspecified: Secondary | ICD-10-CM | POA: Diagnosis not present

## 2024-01-23 DIAGNOSIS — I10 Essential (primary) hypertension: Secondary | ICD-10-CM | POA: Diagnosis not present

## 2024-01-28 ENCOUNTER — Inpatient Hospital Stay: Payer: Medicare Other

## 2024-01-29 ENCOUNTER — Ambulatory Visit: Payer: Medicare Other | Admitting: Gastroenterology

## 2024-01-31 DIAGNOSIS — J440 Chronic obstructive pulmonary disease with acute lower respiratory infection: Secondary | ICD-10-CM | POA: Diagnosis not present

## 2024-01-31 DIAGNOSIS — J439 Emphysema, unspecified: Secondary | ICD-10-CM | POA: Diagnosis not present

## 2024-01-31 DIAGNOSIS — I700 Atherosclerosis of aorta: Secondary | ICD-10-CM | POA: Diagnosis not present

## 2024-01-31 DIAGNOSIS — J189 Pneumonia, unspecified organism: Secondary | ICD-10-CM | POA: Diagnosis not present

## 2024-01-31 DIAGNOSIS — I10 Essential (primary) hypertension: Secondary | ICD-10-CM | POA: Diagnosis not present

## 2024-01-31 DIAGNOSIS — D509 Iron deficiency anemia, unspecified: Secondary | ICD-10-CM | POA: Diagnosis not present

## 2024-02-04 ENCOUNTER — Inpatient Hospital Stay: Payer: Medicare Other | Admitting: Physician Assistant

## 2024-02-04 DIAGNOSIS — D509 Iron deficiency anemia, unspecified: Secondary | ICD-10-CM | POA: Diagnosis not present

## 2024-02-04 DIAGNOSIS — J439 Emphysema, unspecified: Secondary | ICD-10-CM | POA: Diagnosis not present

## 2024-02-04 DIAGNOSIS — I10 Essential (primary) hypertension: Secondary | ICD-10-CM | POA: Diagnosis not present

## 2024-02-04 DIAGNOSIS — I700 Atherosclerosis of aorta: Secondary | ICD-10-CM | POA: Diagnosis not present

## 2024-02-04 DIAGNOSIS — J440 Chronic obstructive pulmonary disease with acute lower respiratory infection: Secondary | ICD-10-CM | POA: Diagnosis not present

## 2024-02-04 DIAGNOSIS — J189 Pneumonia, unspecified organism: Secondary | ICD-10-CM | POA: Diagnosis not present

## 2024-02-05 NOTE — Progress Notes (Signed)
 Referring Provider: Shona Norleen PEDLAR, MD Primary Care Physician:  Shona Norleen PEDLAR, MD Primary GI Physician: Dr. Cindie  Chief Complaint  Patient presents with   Dysphagia    HPI:   Christine Leblanc is a 89 y.o. female with history of GERD, dysphagia, weight loss, constipation, presenting today with chief complaint of constipation.   EGD March 2016: Proximal esophageal web s/p dilation, multiple gastric polyps s/p multiple biopsies, mild erosive gastritis s/p biopsy.  Pathology with fundic gland polyps, chronic gastritis, negative for H. pylori.    Modified barium swallow 09/24/2022:  Severe oropharyngeal dysphagia characterized by weak lingual movement resulting in prolonged oral transit, piecemeal deglutition, reduced anterior posterior movement with lingual residuals; pharyngeal phase is marked by impaired movement and timing of velopharyngeal closure, reduced tongue base retraction and little no epiglottic deflection with reduced laryngeal vestibule closure resulting in significant vallecula residue and aspiration after the swallow with all liquid consistencies. Head turn to the LEFT was possibly slightly better in terms of moving more of the bolus through the pharynx and UES than head neutral or to the right. Pt with wet vocal quality and intermittent throat clears and some coughing throughout, but never able to remove aspirate from trachea. The barium tablet became lodged in the valleculae and was not cleared and Pt without awareness of the same (reduced sensation). This imaging was compare to her last MBSS in 2017 and found to be similar in terms of physiology, but worse (greater weakness and increased residuals and aspiration).    Pt may wish to consider alternative means of nutrition to supplement oral nutrition, however she is unsure she wants to do this. Recommend that she meet with a dietician to help guide her in amount of calories needed per day. She would like to continue with oral diet  and she was encouraged to continue practicing good oral care and improve her cough to continue with D2/soft finely chopped and thin liquids. There did not appear to be an appreciable difference in thin versus thickened liquids (increased residuals with thicker consistencies and textures). Because the barium tablet was not removed from her valleculae, she should try to crush medications as able and take in puree/yogurt and follow with liquid wash.   Last seen in the office 06/26/23. Continued to struggle with a significant amount of mucus and coughing daily. Taking hours to ger mucous cleared. She was following a soft/pured diet and was doing well with this overall. GERD was controlled with PPI BID. Did not feel that there was any role for repeat endoscopy as she was not presenting with any esophageal dysphagia and primary limiting factor seem to be oropharyngeal.  Extensive amount of time was spent with patient and her son discussing oropharyngeal dysfunction.  She was referred back to SLP and advised to continue her current diet/medication regimen.  Patient was seen by speech therapy 07/24/2023 felt to have stable, severe oropharyngeal dysphagia.  She was found to have decreased coordination of swallow, little to no epiglottic deflection, diminished pharyngeal stripping wave and decreased laryngeal vestibule closure resulting in trace to moderate amount of silent aspiration of all liquid consistencies.  Moderate to majority of contrast remained in pharyngeal space after initial swallow, worse with thicker/heavier consistencies.  Head turned to the left with slight chin tuck with swallows did seem to facilitate pharyngeal clearance in conjunction with liquid wash to clear residue.  Patient with wet vocal quality and intermittent throat clears and some coughing throughout, the patient was  never able to remove aspirate from trachea.  Barium tablet became lodged in the vallecula and was not cleared, similar to prior  modified barium swallow.  As she was maintaining her weight and had not had recurrent admissions with pneumonia, recommended continuing protein shakes, D2/pured diet as well as home health speech therapy.  Patient recently admitted with TIA 01/06/24.   ED evaluation 01/20/24 for worsening dysphagia, recovering from COVID. Chest and neck x ray showed no abnormalities.   Today:  Chronic constipation but worsening over the last 3 weeks. Had skipped a week without a BM. Missed 1 day last week. Skipped 1 day this week.   Taking 2 senna daily.  Has taken dulcolax.   No brbpr or melena.   Following a puree diet and is doing ok. Maintaining her weight. Actually put a few pounds on.   GERD is better with pantoprazole  40 mg BID.   Past Medical History:  Diagnosis Date   Anemia    Asthma    Baker's cyst    Cancer (HCC)    skin Ca- ? basal cell    Chronic back pain    Chronic neck pain    Degenerative joint disease    Left shoulder; cervical spine, knees & hands    Gastroesophageal reflux disease    Hiatal hernia; distal esophageal web requiring dilatation; gastric polyps; gastritis; refuses colonoscopy   GERD (gastroesophageal reflux disease)    History of stress test 1990's   stress test done under the care of Dr. Juventino & Dr. Court, now being followed by Dr. Alvan- in [place] , recently seen & told to f/U in one yr.    Hyperlipidemia 04/27/2011   Hypertension    Hypothyroidism    Lymphocytic thyroiditis    Mild carotid artery disease (HCC)    Multiple thyroid  nodules 2010   Adenomatous; thyroidectomy in 2010   Orthostasis    Osteoarthritis of right knee 07/27/2014   Pneumonia    hosp. for pneumonia- long time ago    Primary localized osteoarthrosis of right shoulder 06/21/2015   Seizures (HCC)    yes- as a child- & into adult years, states she took med. for them at one time, stopped at 32 yrs. of age    Syncope and collapse 2007   Possible CVA in 2012 with left lower  extremity weakness; refused hospitalization; CT-Atrophy and chronic microvascular ischemic change.     Past Surgical History:  Procedure Laterality Date   ABDOMINAL HYSTERECTOMY     fibroids   CATARACT EXTRACTION     Bilateral; redo surgery on the right for incomplete primary procedure   COLONOSCOPY  Remote   ESOPHAGOGASTRODUODENOSCOPY  11/2009   Dr. Harvey: probable distal web s/p dilation small hiatal hernia/gastric polyps/mild gastritis, appeared to have narrowing at the junction of D1 and D2 dilated up to 12 mm   ESOPHAGOGASTRODUODENOSCOPY N/A 03/21/2015   Surgeon: Margo LITTIE Harvey, MD; proximal esophageal web s/p dilation, multiple gastric polyps s/p multiple biopsies, mild erosive gastritis s/p biopsy.  Pathology with fundic gland polyps, chronic gastritis, negative for H. pylori.   EYE SURGERY     PROLAPSED UTERINE FIBROID LIGATION     outcomed with rectocele & cystocele   SAVORY DILATION N/A 03/21/2015   Procedure: SAVORY DILATION;  Surgeon: Margo LITTIE Harvey, MD;  Location: [facility];  Service: Endoscopy;  Laterality: N/A;   SHOULDER SURGERY     left   TOTAL KNEE ARTHROPLASTY Right 07/27/2014   Procedure: RIGHT TOTAL KNEE  ARTHROPLASTY;  Surgeon: Fonda SHAUNNA Olmsted, MD;  Location[facility];  Service: Orthopedics;  Laterality: Right;   TOTAL SHOULDER ARTHROPLASTY Right 06/21/2015   Procedure: RIGHT TOTAL SHOULDER ARTHROPLASTY;  Surgeon: Fonda Olmsted, MD;  Location: [facility] OR;  Service: Orthopedics;  Laterality: Right;   TOTAL THYROIDECTOMY  2010    Current Outpatient Medications  Medication Sig Dispense Refill   aspirin  EC 81 MG tablet Take 1 tablet (81 mg total) by mouth daily. Swallow whole. 90 tablet 0   atorvastatin  (LIPITOR) 40 MG tablet Take 1 tablet (40 mg total) by mouth at bedtime. 30 tablet 3   Carbinoxamine  Maleate 4 MG TABS Take 1 tablet (4 mg total) by mouth at bedtime. 90 tablet 4   clopidogrel  (PLAVIX ) 75 MG tablet Take 1 tablet (75 mg total) by mouth daily. 30 tablet 3    diazepam  (VALIUM ) 5 MG tablet Take 2.5 mg by mouth daily as needed for anxiety.     ferrous sulfate  325 (65 FE) MG tablet Take 325 mg by mouth. Takes one on Monday, Wednesday and Friday     fluticasone  (FLONASE ) 50 MCG/ACT nasal spray Place 2 sprays into both nostrils daily. 16 g 5   ipratropium (ATROVENT ) 0.06 % nasal spray Place 1 spray into both nostrils 4 (four) times daily as needed for rhinitis. 15 mL 5   levothyroxine  (SYNTHROID ) 88 MCG tablet TAKE 1 TABLET DAILY ON MONDAY THROUGH SATURDAY AND ONE-HALF (1/2) TABLET ON SUNDAY (Patient taking differently: Take 88 mcg by mouth See admin instructions. TAKE 1 TABLET DAILY ON MONDAY THROUGH SATURDAY AND ONE-HALF (1/2) TABLET ON SUNDAY) 90 tablet 3   pantoprazole (PROTONIX) 40 MG tablet Take 1 tablet (40 mg total) by mouth 2 (two) times daily before a meal. 180 tablet 3   senna (SENOKOT) 8.6 MG tablet Take 2 tablets by mouth at bedtime.     No current facility-administered medications for this visit.    Allergies as of 02/06/2024 - Review Complete 02/06/2024  Allergen Reaction Noted   Celecoxib Shortness Of Breath    Dexlansoprazole Anaphylaxis and Other (See Comments) 05/04/2016   Famotidine Other (See Comments) 11/04/2018    Family History  Problem Relation Age of Onset   Heart disease Mother    Gallbladder disease Mother    Heart failure Mother    Diabetes Mother    Breast cancer Sister        30  years ago   Diabetes Sister    Heart attack Father    Diabetes Maternal Grandmother    Colon cancer Neg Hx    Stroke Neg Hx     Social History   Socioeconomic History   Marital status: Widowed    Spouse name: Not on file   Number of children: 4   Years of education: 31   Highest education level: Not on file  Occupational History   Occupation: Artist    Comment: does not yield regular income   Occupation: Retired    Comment: engineer, civil (consulting)  Tobacco Use   Smoking status: Former    Current packs/day: 0.00    Average packs/day: 0.8  packs/day for 20.0 years (16.0 ttl pk-yrs)    Types: Cigarettes    Start date: 12/31/1958    Quit date: 12/31/1978    Years since quitting: 45.1   Smokeless tobacco: Never  Vaping Use   Vaping status: Never Used  Substance and Sexual Activity   Alcohol  use: Not Currently    Comment: rarely   Drug use: No  Sexual activity: Never    Birth control/protection: Surgical    Comment: widowed since 2010  Other Topics Concern   Not on file  Social History Narrative   ** Merged History Encounter **       Lives at home. Her daughter lives with her.  Caffeine use: 1 cup coffee per day    Social Drivers of Health   Financial Resource Strain: Not on file  Food Insecurity: No Food Insecurity (01/07/2024)   Hunger Vital Sign    Worried About Running Out of Food in the Last Year: Never true    Ran Out of Food in the Last Year: Never true  Transportation Needs: No Transportation Needs (01/07/2024)   PRAPARE - Administrator, Civil Service (Medical): No    Lack of Transportation (Non-Medical): No  Physical Activity: Not on file  Stress: Not on file  Social Connections: Not on file    Review of Systems: Gen: Denies fever, chills, cold or flu like symptoms, pre-syncope, or syncope.  CV: Denies chest pain, palpitations.  Resp: Denies dyspnea at rest, cough.  GI: See HPI Heme: See HPI  Physical Exam: BP (!) 162/82 (BP Location: Right Arm, Patient Position: Sitting, Cuff Size: Small)   Pulse 81   Temp 98.3 F (36.8 C) (Oral)   Ht 5' 1 (1.549 m)   Wt 107 lb 9.6 oz (48.8 kg)   SpO2 97%   BMI 20.33 kg/m  General:   Alert and oriented. No distress noted. Pleasant and cooperative.  Head:  Normocephalic and atraumatic. Eyes:  Conjuctiva clear without scleral icterus. Abdomen:  +BS, soft, non-tender and non-distended. No rebound or guarding. No HSM or masses noted. Msk:  Symmetrical without gross deformities. Normal posture. Extremities:  Without edema. Neurologic:  Alert and   oriented x4 Psych:  Normal mood and affect.    Assessment:  89 y.o. female with history of GERD, dysphagia, weight loss, constipation,  presenting today with chief complaint of worsening constipation.   Constipation:  Chronic, but worse over the last 3 weeks. Taking taking 2 senna daily. No alarm symptoms. Recommended starting MiraLAX  daily.   GERD:  Doing well on pantoprazole  40 mg BID.   Dysphagia:  Severe oropharyngeal dysphagia. Managing fairly well with puree diet. Gaining weight.    Plan:  Start MiraLAX  1/2 capful daily. Increase to 1 capful daily if no improvement.  Can hold senna when starting MiraLAX .  Continue pantoprazole  40 mg BID. Follow-up in 8 weeks or sooner if needed   Josette Centers, DEVONNA [facility] 02/06/2024

## 2024-02-06 ENCOUNTER — Encounter: Payer: Self-pay | Admitting: Gastroenterology

## 2024-02-06 ENCOUNTER — Ambulatory Visit: Payer: Medicare Other | Admitting: Gastroenterology

## 2024-02-06 VITALS — BP 162/82 | HR 81 | Temp 98.3°F | Ht 61.0 in | Wt 107.6 lb

## 2024-02-06 DIAGNOSIS — K219 Gastro-esophageal reflux disease without esophagitis: Secondary | ICD-10-CM

## 2024-02-06 DIAGNOSIS — R1310 Dysphagia, unspecified: Secondary | ICD-10-CM

## 2024-02-06 DIAGNOSIS — K5909 Other constipation: Secondary | ICD-10-CM | POA: Diagnosis not present

## 2024-02-06 DIAGNOSIS — R1312 Dysphagia, oropharyngeal phase: Secondary | ICD-10-CM

## 2024-02-06 NOTE — Patient Instructions (Addendum)
 Start MiraLAX  1/2 capful to 1 capful daily in 8 oz of water  or other non carbonated beverage.   Stop senna when you start MiraLAX .   Continue pantoprazole  40 mg twice daily.   I will see you back in 8 weeks or sooner if needed.   Josette Centers, PA-C [facility]

## 2024-02-08 DIAGNOSIS — D509 Iron deficiency anemia, unspecified: Secondary | ICD-10-CM | POA: Diagnosis not present

## 2024-02-08 DIAGNOSIS — I700 Atherosclerosis of aorta: Secondary | ICD-10-CM | POA: Diagnosis not present

## 2024-02-08 DIAGNOSIS — J439 Emphysema, unspecified: Secondary | ICD-10-CM | POA: Diagnosis not present

## 2024-02-08 DIAGNOSIS — J440 Chronic obstructive pulmonary disease with acute lower respiratory infection: Secondary | ICD-10-CM | POA: Diagnosis not present

## 2024-02-08 DIAGNOSIS — I10 Essential (primary) hypertension: Secondary | ICD-10-CM | POA: Diagnosis not present

## 2024-02-08 DIAGNOSIS — J189 Pneumonia, unspecified organism: Secondary | ICD-10-CM | POA: Diagnosis not present

## 2024-02-12 DIAGNOSIS — D509 Iron deficiency anemia, unspecified: Secondary | ICD-10-CM | POA: Diagnosis not present

## 2024-02-12 DIAGNOSIS — I10 Essential (primary) hypertension: Secondary | ICD-10-CM | POA: Diagnosis not present

## 2024-02-12 DIAGNOSIS — J439 Emphysema, unspecified: Secondary | ICD-10-CM | POA: Diagnosis not present

## 2024-02-12 DIAGNOSIS — I700 Atherosclerosis of aorta: Secondary | ICD-10-CM | POA: Diagnosis not present

## 2024-02-12 DIAGNOSIS — J440 Chronic obstructive pulmonary disease with acute lower respiratory infection: Secondary | ICD-10-CM | POA: Diagnosis not present

## 2024-02-12 DIAGNOSIS — J189 Pneumonia, unspecified organism: Secondary | ICD-10-CM | POA: Diagnosis not present

## 2024-02-13 DIAGNOSIS — D509 Iron deficiency anemia, unspecified: Secondary | ICD-10-CM | POA: Diagnosis not present

## 2024-02-13 DIAGNOSIS — J440 Chronic obstructive pulmonary disease with acute lower respiratory infection: Secondary | ICD-10-CM | POA: Diagnosis not present

## 2024-02-13 DIAGNOSIS — J189 Pneumonia, unspecified organism: Secondary | ICD-10-CM | POA: Diagnosis not present

## 2024-02-13 DIAGNOSIS — I700 Atherosclerosis of aorta: Secondary | ICD-10-CM | POA: Diagnosis not present

## 2024-02-13 DIAGNOSIS — I10 Essential (primary) hypertension: Secondary | ICD-10-CM | POA: Diagnosis not present

## 2024-02-13 DIAGNOSIS — J439 Emphysema, unspecified: Secondary | ICD-10-CM | POA: Diagnosis not present

## 2024-02-24 ENCOUNTER — Inpatient Hospital Stay: Payer: Medicare Other | Attending: Physician Assistant

## 2024-02-24 DIAGNOSIS — Z803 Family history of malignant neoplasm of breast: Secondary | ICD-10-CM | POA: Insufficient documentation

## 2024-02-24 DIAGNOSIS — Z86718 Personal history of other venous thrombosis and embolism: Secondary | ICD-10-CM | POA: Diagnosis not present

## 2024-02-24 DIAGNOSIS — N1831 Chronic kidney disease, stage 3a: Secondary | ICD-10-CM | POA: Diagnosis not present

## 2024-02-24 DIAGNOSIS — Z87891 Personal history of nicotine dependence: Secondary | ICD-10-CM | POA: Insufficient documentation

## 2024-02-24 DIAGNOSIS — D472 Monoclonal gammopathy: Secondary | ICD-10-CM | POA: Insufficient documentation

## 2024-02-24 DIAGNOSIS — D649 Anemia, unspecified: Secondary | ICD-10-CM | POA: Insufficient documentation

## 2024-02-24 DIAGNOSIS — R5383 Other fatigue: Secondary | ICD-10-CM | POA: Insufficient documentation

## 2024-02-24 DIAGNOSIS — D631 Anemia in chronic kidney disease: Secondary | ICD-10-CM

## 2024-02-24 DIAGNOSIS — M1712 Unilateral primary osteoarthritis, left knee: Secondary | ICD-10-CM | POA: Diagnosis not present

## 2024-02-24 DIAGNOSIS — D509 Iron deficiency anemia, unspecified: Secondary | ICD-10-CM

## 2024-02-24 LAB — CBC WITH DIFFERENTIAL/PLATELET
Abs Immature Granulocytes: 0 10*3/uL (ref 0.00–0.07)
Basophils Absolute: 0.1 10*3/uL (ref 0.0–0.1)
Basophils Relative: 1 %
Eosinophils Absolute: 0.5 10*3/uL (ref 0.0–0.5)
Eosinophils Relative: 8 %
HCT: 32.6 % — ABNORMAL LOW (ref 36.0–46.0)
Hemoglobin: 10.2 g/dL — ABNORMAL LOW (ref 12.0–15.0)
Immature Granulocytes: 0 %
Lymphocytes Relative: 21 %
Lymphs Abs: 1.3 10*3/uL (ref 0.7–4.0)
MCH: 29.2 pg (ref 26.0–34.0)
MCHC: 31.3 g/dL (ref 30.0–36.0)
MCV: 93.4 fL (ref 80.0–100.0)
Monocytes Absolute: 0.7 10*3/uL (ref 0.1–1.0)
Monocytes Relative: 11 %
Neutro Abs: 3.5 10*3/uL (ref 1.7–7.7)
Neutrophils Relative %: 59 %
Platelets: 239 10*3/uL (ref 150–400)
RBC: 3.49 MIL/uL — ABNORMAL LOW (ref 3.87–5.11)
RDW: 14.8 % (ref 11.5–15.5)
WBC: 6.1 10*3/uL (ref 4.0–10.5)
nRBC: 0 % (ref 0.0–0.2)

## 2024-02-24 LAB — COMPREHENSIVE METABOLIC PANEL
ALT: 11 U/L (ref 0–44)
AST: 18 U/L (ref 15–41)
Albumin: 3.2 g/dL — ABNORMAL LOW (ref 3.5–5.0)
Alkaline Phosphatase: 57 U/L (ref 38–126)
Anion gap: 9 (ref 5–15)
BUN: 20 mg/dL (ref 8–23)
CO2: 27 mmol/L (ref 22–32)
Calcium: 9 mg/dL (ref 8.9–10.3)
Chloride: 106 mmol/L (ref 98–111)
Creatinine, Ser: 1.06 mg/dL — ABNORMAL HIGH (ref 0.44–1.00)
GFR, Estimated: 49 mL/min — ABNORMAL LOW (ref >60)
Glucose, Bld: 126 mg/dL — ABNORMAL HIGH (ref 70–99)
Potassium: 3.6 mmol/L (ref 3.5–5.1)
Sodium: 142 mmol/L (ref 135–145)
Total Bilirubin: 0.6 mg/dL (ref 0.0–1.2)
Total Protein: 6.9 g/dL (ref 6.5–8.1)

## 2024-02-24 LAB — IRON AND TIBC
Iron: 61 ug/dL (ref 28–170)
Saturation Ratios: 23 % (ref 10.4–31.8)
TIBC: 261 ug/dL (ref 250–450)
UIBC: 200 ug/dL

## 2024-02-24 LAB — FERRITIN: Ferritin: 211 ng/mL (ref 11–307)

## 2024-02-27 DIAGNOSIS — R600 Localized edema: Secondary | ICD-10-CM | POA: Diagnosis not present

## 2024-02-27 DIAGNOSIS — R1310 Dysphagia, unspecified: Secondary | ICD-10-CM | POA: Diagnosis not present

## 2024-02-29 NOTE — Progress Notes (Unsigned)
 [facility] [address], [place] [address]   CLINIC:  Medical Oncology/Hematology  PCP:  Benita Stabile, MD [address] Laurey Morale [place] [address] [phone]   REASON FOR VISIT:  Follow-up for normocytic anemia + history of DVT + MGUS  PRIOR THERAPY: Oral iron supplement  CURRENT THERAPY: Intermittent IV iron  INTERVAL HISTORY:   Christine Leblanc 89 y.o. female returns for routine follow-up of normocytic anemia, MGUS, and history of DVT.  She was last seen by Rojelio Brenner PA-C on 10/23/2023.   In the interim since her last visit, she was hospitalized twice: 12/09/2023 through 12/11/2023 for pneumonia 01/06/2024 through 01/07/2024 for TIA  At today's visit, she reports feeling fair.  She has mild fatigue that is at baseline.  She denies any rectal bleeding, melena, epistaxis, or hematochezia.  She denies any pica, headaches, lightheadedness, or syncope.   She does not have any symptoms of recurrent DVT or new PE.  She denies any new onset bone pain (continues to have chronic knee pain), neurologic changes, or B symptoms. She continues to take her iron supplement 3 times weekly.  She has 75% energy and 100% appetite. She endorses that she is maintaining a stable weight.  ASSESSMENT & PLAN:  1.  Normocytic anemia (CKD and iron deficiency) - Mild to moderate normocytic anemia since 2019 - History of CKD stage IIIa since 2019 - Hematology workup showed functional iron deficiency with ferritin 228 and percent saturation 19%.  She had normal B12, copper, folic acid, MMA, LDH.  Reticulocytes insufficient for degree of anemia at 1.0%. - No prior history of blood transfusion. - Taking oral iron supplement for >1 year (currently taking on MWF) - Received IV iron with Feraheme x 1 on 02/27/2023.  (PREMEDS given due to history of multiple drug allergies.) - Denies rectal bleeding or melena  - Energy at baseline.  No pica.   - Most recent labs (02/24/2024): Hgb 10.2/MCV  93.4.  Previously noted leukocytosis has resolved. Ferritin 211, iron saturation 23% Creatinine 1.06/GFR 49 (baseline CKD stage IIIa) - DIFFERENTIAL DIAGNOSIS favors anemia secondary to CKD stage II/IIIa and functional iron deficiency.  She may also have some underlying bone marrow disorder. - PLAN: Continue ferrous sulfate 325 mg 3 times weekly.  No indication for IV iron at this time. - We will continue to monitor and consider starting ESA if she has Hgb consistently <9.5 despite adequate iron stores. - Joint decision-making discussion with patient and son (05/30/2023) - will defer any bone marrow biopsy at this time due to her advanced age and relatively mild degree of anemia.  If she has worsening anemia in the future, we would consider starting ESA, and would reevaluate need for bone marrow biopsy if anemia was unresponsive to ESA. - Anemia follow-up in 3 months   2.   IgG kappa monoclonal gammopathy: - Initially evaluated at [facility] for MGUS in June 2018, but was lost to follow-up after December 2020. - Noted again during workup of anemia with initial labs (02/15/2023): Immunofixation shows IgG kappa SPEP with M spike 0.2.  (stable compared to 2020) Elevated kappa free light chain 63.9, elevated lambda 28.5, mildly elevated free light chain ratio 2.24. - Most recent MGUS/myeloma panel (10/18/2023):  SPEP with stable M-spike 0.3 Elevated kappa 49.1, normal lambda 25.4, mildly elevated FLC ratio 1.93, stable. Hgb 9.8, creatinine 1.02, calcium 8.7.  Normal LDH. -Skeletal survey (10/18/2023): No evidence of lytic or destructive lesions - 24-hour urine/UPEP (  10/25/2023): No M spike, monoclonal protein, or Bence-Jones proteinuria. - No apparent CRAB features at this time.  Although she does have some anemia, it is mild and considered more likely due to other factors. - We have discussed diagnosis of MGUS including the small but statistically significant risk of progression to multiple  myeloma. - PLAN:  Repeat MGUS/myeloma labs every 6 months (next due April/May 2025)  3   Resolved chronic DVT: - Left leg Doppler (11/01/2022): Chronic appearing DVT in the left profundofemoral vein. - She used Eliquis for 10 days. - Left leg Doppler (11/12/2022): No evidence of femoral-popliteal DVT or superficial phlebitis. - Repeat left leg Doppler (12/07/2022): No evidence of DVT.  Left popliteal fossa Baker's cyst. - She denies any current symptoms concerning for DVT or PE.    4.  Social/family history: - Lives at home with her disabled daughter.  Activities are limited by left knee pain from arthritis.  Uses walker for ambulation.  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  Quit smoking 50 years ago. - Sister had breast cancer.  PLAN SUMMARY: >> Labs in 3 months = CBC/D, CMP, ferritin, iron/TIBC, LDH, light chains, SPEP >> OFFICE visit in 3 months (1 week after labs)     REVIEW OF SYSTEMS:   Review of Systems  Constitutional:  Positive for fatigue. Negative for appetite change, chills, diaphoresis, fever and unexpected weight change.  HENT:   Positive for trouble swallowing. Negative for lump/mass and nosebleeds.   Eyes:  Negative for eye problems.  Respiratory:  Positive for cough (chronic). Negative for hemoptysis and shortness of breath.   Cardiovascular:  Positive for chest pain (at times, none today). Negative for leg swelling and palpitations.  Gastrointestinal:  Positive for constipation. Negative for abdominal pain, blood in stool, diarrhea, nausea and vomiting.  Genitourinary:  Negative for hematuria.   Musculoskeletal:  Positive for arthralgias.  Skin: Negative.   Neurological:  Negative for dizziness, headaches and light-headedness.  Hematological:  Does not bruise/bleed easily.     PHYSICAL EXAM:  ECOG PERFORMANCE STATUS: 2 - Symptomatic, <50% confined to bed  Vitals:   03/02/24 1436  BP: (!) 154/83  Pulse: 87  Resp: 18  Temp: 99.1 F  (37.3 C)  SpO2: 97%    Filed Weights   03/02/24 1436  Weight: 106 lb (48.1 kg)    Physical Exam Constitutional:      Appearance: Normal appearance. She is underweight.     Comments: Somewhat thin and frail-appearing  Cardiovascular:     Heart sounds: Normal heart sounds.  Pulmonary:     Breath sounds: Normal breath sounds. Decreased air movement present.  Neurological:     General: No focal deficit present.     Mental Status: Mental status is at baseline.  Psychiatric:        Behavior: Behavior normal. Behavior is cooperative.     PAST MEDICAL/SURGICAL HISTORY:  Past Medical History:  Diagnosis Date   Anemia    Asthma    Baker's cyst    Cancer (HCC)    skin Ca- ? basal cell    Chronic back pain    Chronic neck pain    Degenerative joint disease    Left shoulder; cervical spine, knees & hands    Gastroesophageal reflux disease    Hiatal hernia; distal esophageal web requiring dilatation; gastric polyps; gastritis; refuses colonoscopy   GERD (gastroesophageal reflux disease)    History of stress test 1990's   stress test  done under the care of Dr. Dietrich Pates & Dr. Allyson Sabal, now being followed by Dr. Wyline Mood- in [place] , recently seen & told to f/U in one yr.    Hyperlipidemia 04/27/2011   Hypertension    Hypothyroidism    Lymphocytic thyroiditis    Mild carotid artery disease (HCC)    Multiple thyroid nodules 2010   Adenomatous; thyroidectomy in 2010   Orthostasis    Osteoarthritis of right knee 07/27/2014   Pneumonia    hosp. for pneumonia- long time ago    Primary localized osteoarthrosis of right shoulder 06/21/2015   Seizures (HCC)    yes- as a child- & into adult years, states she took med. for them at one time, stopped at 32 yrs. of age    Syncope and collapse 2007   Possible CVA in 2012 with left lower extremity weakness; refused hospitalization; CT-Atrophy and chronic microvascular ischemic change.    Past Surgical History:  Procedure Laterality Date    ABDOMINAL HYSTERECTOMY     fibroids   CATARACT EXTRACTION     Bilateral; redo surgery on the right for incomplete primary procedure   COLONOSCOPY  Remote   ESOPHAGOGASTRODUODENOSCOPY  11/2009   Dr. Darrick Penna: probable distal web s/p dilation small hiatal hernia/gastric polyps/mild gastritis, appeared to have narrowing at the junction of D1 and D2 dilated up to 12 mm   ESOPHAGOGASTRODUODENOSCOPY N/A 03/21/2015   Surgeon: West Bali, MD; proximal esophageal web s/p dilation, multiple gastric polyps s/p multiple biopsies, mild erosive gastritis s/p biopsy.  Pathology with fundic gland polyps, chronic gastritis, negative for H. pylori.   EYE SURGERY     PROLAPSED UTERINE FIBROID LIGATION     outcomed with rectocele & cystocele   SAVORY DILATION N/A 03/21/2015   Procedure: SAVORY DILATION;  Surgeon: West Bali, MD;  Location: [facility]ITE;  Service: Endoscopy;  Laterality: N/A;   SHOULDER SURGERY     left   TOTAL KNEE ARTHROPLASTY Right 07/27/2014   Procedure: RIGHT TOTAL KNEE ARTHROPLASTY;  Surgeon: Eulas Post, MD;  Location: [facility] OR;  Service: Orthopedics;  Laterality: Right;   TOTAL SHOULDER ARTHROPLASTY Right 06/21/2015   Procedure: RIGHT TOTAL SHOULDER ARTHROPLASTY;  Surgeon: Teryl Lucy, MD;  Location: [facility] OR;  Service: Orthopedics;  Laterality: Right;   TOTAL THYROIDECTOMY  2010    SOCIAL HISTORY:  Social History   Socioeconomic History   Marital status: Widowed    Spouse name: Not on file   Number of children: 4   Years of education: 48   Highest education level: Not on file  Occupational History   Occupation: Artist    Comment: does not yield regular income   Occupation: Retired    Comment: Engineer, civil (consulting)  Tobacco Use   Smoking status: Former    Current packs/day: 0.00    Average packs/day: 0.8 packs/day for 20.0 years (16.0 ttl pk-yrs)    Types: Cigarettes    Start date: 12/31/1958    Quit date: 12/31/1978    Years since quitting: 45.2   Smokeless tobacco: Never   Vaping Use   Vaping status: Never Used  Substance and Sexual Activity   Alcohol use: Not Currently    Comment: rarely   Drug use: No   Sexual activity: Never    Birth control/protection: Surgical    Comment: widowed since 2010  Other Topics Concern   Not on file  Social History Narrative   ** Merged History Encounter **       Lives at home.  Her daughter lives with her.  Caffeine use: 1 cup coffee per day    Social Drivers of Health   Financial Resource Strain: Not on file  Food Insecurity: No Food Insecurity (01/07/2024)   Hunger Vital Sign    Worried About Running Out of Food in the Last Year: Never true    Ran Out of Food in the Last Year: Never true  Transportation Needs: No Transportation Needs (01/07/2024)   PRAPARE - Administrator, Civil Service (Medical): No    Lack of Transportation (Non-Medical): No  Physical Activity: Not on file  Stress: Not on file  Social Connections: Not on file  Intimate Partner Violence: Not At Risk (01/07/2024)   Humiliation, Afraid, Rape, and Kick questionnaire    Fear of Current or Ex-Partner: No    Emotionally Abused: No    Physically Abused: No    Sexually Abused: No    FAMILY HISTORY:  Family History  Problem Relation Age of Onset   Heart disease Mother    Gallbladder disease Mother    Heart failure Mother    Diabetes Mother    Breast cancer Sister        30 years ago   Diabetes Sister    Heart attack Father    Diabetes Maternal Grandmother    Colon cancer Neg Hx    Stroke Neg Hx     CURRENT MEDICATIONS:  Outpatient Encounter Medications as of 03/02/2024  Medication Sig   aspirin EC 81 MG tablet Take 1 tablet (81 mg total) by mouth daily. Swallow whole.   ferrous sulfate 325 (65 FE) MG tablet Take 325 mg by mouth. Takes one on Monday, Wednesday and Friday   fluticasone (FLONASE) 50 MCG/ACT nasal spray Place 2 sprays into both nostrils daily.   ipratropium (ATROVENT) 0.06 % nasal spray Place 1 spray into both  nostrils 4 (four) times daily as needed for rhinitis.   levothyroxine (SYNTHROID) 88 MCG tablet TAKE 1 TABLET DAILY ON MONDAY THROUGH SATURDAY AND ONE-HALF (1/2) TABLET ON SUNDAY (Patient taking differently: Take 88 mcg by mouth See admin instructions. TAKE 1 TABLET DAILY ON MONDAY THROUGH SATURDAY AND ONE-HALF (1/2) TABLET ON SUNDAY)   pantoprazole (PROTONIX) 40 MG tablet Take 1 tablet (40 mg total) by mouth 2 (two) times daily before a meal.   senna (SENOKOT) 8.6 MG tablet Take 2 tablets by mouth at bedtime.   atorvastatin (LIPITOR) 40 MG tablet Take 1 tablet (40 mg total) by mouth at bedtime. (Patient not taking: Reported on 03/02/2024)   Carbinoxamine Maleate 4 MG TABS Take 1 tablet (4 mg total) by mouth at bedtime.   clopidogrel (PLAVIX) 75 MG tablet Take 1 tablet (75 mg total) by mouth daily. (Patient not taking: Reported on 03/02/2024)   diazepam (VALIUM) 5 MG tablet Take 2.5 mg by mouth daily as needed for anxiety. (Patient not taking: Reported on 03/02/2024)   No facility-administered encounter medications on file as of 03/02/2024.    ALLERGIES:  Allergies  Allergen Reactions   Celecoxib Shortness Of Breath   Dexlansoprazole Anaphylaxis and Other (See Comments)    abd pain   Famotidine Other (See Comments)    Makes her feel "very bad"    LABORATORY DATA:  I have reviewed the labs as listed.  CBC    Component Value Date/Time   WBC 6.1 02/24/2024 1419   RBC 3.49 (L) 02/24/2024 1419   HGB 10.2 (L) 02/24/2024 1419   HGB 11.2 05/01/2016 1604   HCT  32.6 (L) 02/24/2024 1419   HCT 33.8 (L) 05/01/2016 1604   PLT 239 02/24/2024 1419   PLT 181 05/01/2016 1604   MCV 93.4 02/24/2024 1419   MCV 89 05/01/2016 1604   MCH 29.2 02/24/2024 1419   MCHC 31.3 02/24/2024 1419   RDW 14.8 02/24/2024 1419   RDW 14.2 05/01/2016 1604   LYMPHSABS 1.3 02/24/2024 1419   LYMPHSABS 1.6 05/01/2016 1604   MONOABS 0.7 02/24/2024 1419   EOSABS 0.5 02/24/2024 1419   EOSABS 0.7 (H) 05/01/2016 1604   BASOSABS  0.1 02/24/2024 1419   BASOSABS 0.0 05/01/2016 1604      Latest Ref Rng & Units 02/24/2024    2:19 PM 01/20/2024    7:31 PM 01/06/2024    2:05 PM  CMP  Glucose 70 - 99 mg/dL 161  096  99   BUN 8 - 23 mg/dL 20  15  19    Creatinine 0.44 - 1.00 mg/dL 0.45  4.09  8.11   Sodium 135 - 145 mmol/L 142  136  139   Potassium 3.5 - 5.1 mmol/L 3.6  3.5  3.4   Chloride 98 - 111 mmol/L 106  100  103   CO2 22 - 32 mmol/L 27  26  29    Calcium 8.9 - 10.3 mg/dL 9.0  8.7  8.9   Total Protein 6.5 - 8.1 g/dL 6.9   7.0   Total Bilirubin 0.0 - 1.2 mg/dL 0.6   0.3   Alkaline Phos 38 - 126 U/L 57   68   AST 15 - 41 U/L 18   19   ALT 0 - 44 U/L 11   13     DIAGNOSTIC IMAGING:  I have independently reviewed the relevant imaging and discussed with the patient.   WRAP UP:  All questions were answered. The patient knows to call the clinic with any problems, questions or concerns.  Medical decision making: Moderate  Time spent on visit: I spent 25 minutes counseling the patient face to face. The total time spent in the appointment was 40 minutes and more than 50% was on counseling.  Carnella Guadalajara, PA-C 03/02/24 3:34 PM

## 2024-03-02 ENCOUNTER — Inpatient Hospital Stay (HOSPITAL_BASED_OUTPATIENT_CLINIC_OR_DEPARTMENT_OTHER): Payer: Medicare Other | Admitting: Physician Assistant

## 2024-03-02 VITALS — BP 154/83 | HR 87 | Temp 99.1°F | Resp 18 | Wt 106.0 lb

## 2024-03-02 DIAGNOSIS — D472 Monoclonal gammopathy: Secondary | ICD-10-CM

## 2024-03-02 DIAGNOSIS — J1081 Influenza due to other identified influenza virus with encephalopathy: Secondary | ICD-10-CM | POA: Diagnosis present

## 2024-03-02 DIAGNOSIS — R058 Other specified cough: Secondary | ICD-10-CM | POA: Diagnosis not present

## 2024-03-02 DIAGNOSIS — Z8673 Personal history of transient ischemic attack (TIA), and cerebral infarction without residual deficits: Secondary | ICD-10-CM | POA: Diagnosis not present

## 2024-03-02 DIAGNOSIS — R1319 Other dysphagia: Secondary | ICD-10-CM | POA: Diagnosis not present

## 2024-03-02 DIAGNOSIS — D631 Anemia in chronic kidney disease: Secondary | ICD-10-CM

## 2024-03-02 DIAGNOSIS — E039 Hypothyroidism, unspecified: Secondary | ICD-10-CM | POA: Diagnosis not present

## 2024-03-02 DIAGNOSIS — Z96611 Presence of right artificial shoulder joint: Secondary | ICD-10-CM | POA: Diagnosis not present

## 2024-03-02 DIAGNOSIS — Z7902 Long term (current) use of antithrombotics/antiplatelets: Secondary | ICD-10-CM | POA: Diagnosis not present

## 2024-03-02 DIAGNOSIS — I129 Hypertensive chronic kidney disease with stage 1 through stage 4 chronic kidney disease, or unspecified chronic kidney disease: Secondary | ICD-10-CM | POA: Diagnosis present

## 2024-03-02 DIAGNOSIS — Z96612 Presence of left artificial shoulder joint: Secondary | ICD-10-CM | POA: Diagnosis not present

## 2024-03-02 DIAGNOSIS — J439 Emphysema, unspecified: Secondary | ICD-10-CM | POA: Diagnosis not present

## 2024-03-02 DIAGNOSIS — Z7982 Long term (current) use of aspirin: Secondary | ICD-10-CM | POA: Diagnosis not present

## 2024-03-02 DIAGNOSIS — R059 Cough, unspecified: Secondary | ICD-10-CM | POA: Diagnosis not present

## 2024-03-02 DIAGNOSIS — R0689 Other abnormalities of breathing: Secondary | ICD-10-CM | POA: Diagnosis not present

## 2024-03-02 DIAGNOSIS — N1831 Chronic kidney disease, stage 3a: Secondary | ICD-10-CM

## 2024-03-02 DIAGNOSIS — J449 Chronic obstructive pulmonary disease, unspecified: Secondary | ICD-10-CM | POA: Diagnosis not present

## 2024-03-02 DIAGNOSIS — R404 Transient alteration of awareness: Secondary | ICD-10-CM | POA: Diagnosis not present

## 2024-03-02 DIAGNOSIS — R413 Other amnesia: Secondary | ICD-10-CM | POA: Diagnosis not present

## 2024-03-02 DIAGNOSIS — X58XXXA Exposure to other specified factors, initial encounter: Secondary | ICD-10-CM | POA: Diagnosis not present

## 2024-03-02 DIAGNOSIS — E785 Hyperlipidemia, unspecified: Secondary | ICD-10-CM | POA: Diagnosis present

## 2024-03-02 DIAGNOSIS — S99921A Unspecified injury of right foot, initial encounter: Secondary | ICD-10-CM | POA: Diagnosis present

## 2024-03-02 DIAGNOSIS — R4702 Dysphasia: Secondary | ICD-10-CM | POA: Diagnosis present

## 2024-03-02 DIAGNOSIS — R000 Tachycardia, unspecified: Secondary | ICD-10-CM | POA: Diagnosis not present

## 2024-03-02 DIAGNOSIS — J9601 Acute respiratory failure with hypoxia: Secondary | ICD-10-CM | POA: Diagnosis not present

## 2024-03-02 DIAGNOSIS — R0902 Hypoxemia: Secondary | ICD-10-CM | POA: Diagnosis not present

## 2024-03-02 DIAGNOSIS — D696 Thrombocytopenia, unspecified: Secondary | ICD-10-CM | POA: Diagnosis not present

## 2024-03-02 DIAGNOSIS — G40909 Epilepsy, unspecified, not intractable, without status epilepticus: Secondary | ICD-10-CM | POA: Diagnosis present

## 2024-03-02 DIAGNOSIS — E871 Hypo-osmolality and hyponatremia: Secondary | ICD-10-CM | POA: Diagnosis not present

## 2024-03-02 DIAGNOSIS — Z833 Family history of diabetes mellitus: Secondary | ICD-10-CM | POA: Diagnosis not present

## 2024-03-02 DIAGNOSIS — J101 Influenza due to other identified influenza virus with other respiratory manifestations: Secondary | ICD-10-CM | POA: Diagnosis not present

## 2024-03-02 DIAGNOSIS — G9340 Encephalopathy, unspecified: Secondary | ICD-10-CM | POA: Diagnosis not present

## 2024-03-02 DIAGNOSIS — E876 Hypokalemia: Secondary | ICD-10-CM | POA: Diagnosis not present

## 2024-03-02 DIAGNOSIS — G9341 Metabolic encephalopathy: Secondary | ICD-10-CM | POA: Diagnosis present

## 2024-03-02 DIAGNOSIS — E861 Hypovolemia: Secondary | ICD-10-CM | POA: Diagnosis not present

## 2024-03-02 DIAGNOSIS — J189 Pneumonia, unspecified organism: Secondary | ICD-10-CM | POA: Diagnosis not present

## 2024-03-02 DIAGNOSIS — N1832 Chronic kidney disease, stage 3b: Secondary | ICD-10-CM | POA: Diagnosis present

## 2024-03-02 DIAGNOSIS — Z8249 Family history of ischemic heart disease and other diseases of the circulatory system: Secondary | ICD-10-CM | POA: Diagnosis not present

## 2024-03-02 DIAGNOSIS — Z79899 Other long term (current) drug therapy: Secondary | ICD-10-CM | POA: Diagnosis not present

## 2024-03-02 DIAGNOSIS — J40 Bronchitis, not specified as acute or chronic: Secondary | ICD-10-CM | POA: Diagnosis not present

## 2024-03-02 DIAGNOSIS — E8809 Other disorders of plasma-protein metabolism, not elsewhere classified: Secondary | ICD-10-CM | POA: Diagnosis not present

## 2024-03-02 DIAGNOSIS — E46 Unspecified protein-calorie malnutrition: Secondary | ICD-10-CM | POA: Diagnosis not present

## 2024-03-02 DIAGNOSIS — R1310 Dysphagia, unspecified: Secondary | ICD-10-CM | POA: Diagnosis not present

## 2024-03-02 DIAGNOSIS — Z1152 Encounter for screening for COVID-19: Secondary | ICD-10-CM | POA: Diagnosis not present

## 2024-03-02 DIAGNOSIS — I10 Essential (primary) hypertension: Secondary | ICD-10-CM | POA: Diagnosis not present

## 2024-03-02 DIAGNOSIS — Z79890 Hormone replacement therapy (postmenopausal): Secondary | ICD-10-CM | POA: Diagnosis not present

## 2024-03-02 DIAGNOSIS — E890 Postprocedural hypothyroidism: Secondary | ICD-10-CM | POA: Diagnosis present

## 2024-03-02 DIAGNOSIS — E860 Dehydration: Secondary | ICD-10-CM | POA: Diagnosis not present

## 2024-03-02 DIAGNOSIS — S90221A Contusion of right lesser toe(s) with damage to nail, initial encounter: Secondary | ICD-10-CM | POA: Diagnosis not present

## 2024-03-02 DIAGNOSIS — I6782 Cerebral ischemia: Secondary | ICD-10-CM | POA: Diagnosis not present

## 2024-03-02 NOTE — Patient Instructions (Signed)
 [facility] **VISIT SUMMARY & IMPORTANT INSTRUCTIONS **   You were seen today by Rojelio Brenner PA-C for your follow-up visit.     ANEMIA Your anemia may be related to your chronic kidney disease (mild age-related decrease in kidney function). It is also possible that you may have some underlying bone marrow disorder which is causing anemia. Regardless of the cause of your anemia, it is mild and stable at this time.  We will continue monitoring of your anemia, but would consider additional testing or treatment if you had a severe worsening of your anemia (hemoglobin consistently < 9.5).  MGUS ("monoclonal gammopathy of undetermined significance") As we discussed, this is a "precancerous" condition where you have slightly increased plasma cells making a slightly increased amount of abnormal immunoglobulin proteins. Although MGUS is not causing any current problems in your body, it has a 1% each year risk of progression to multiple myeloma cancer. We will continue to monitor your labs every 6 months (due in April/May 2025) and check whole-body x-rays once a year (due in October 2025).   LABS: Return in 3 months for repeat labs  MEDICATIONS: Continue iron supplement 3 times each week  FOLLOW-UP APPOINTMENT: Office visit in 3 months (1 week after labs)  ** Thank you for trusting me with your healthcare!  I strive to provide all of my patients with quality care at each visit.  If you receive a survey for this visit, I would be so grateful to you for taking the time to provide feedback.  Thank you in advance!  ~ Sarita Hakanson                   Dr. Doreatha Massed   &   Rojelio Brenner, PA-C   - - - - - - - - - - - - - - - - - -    Thank you for choosing [facility] at [facility] to provide your oncology and hematology care.  To afford each patient quality time with our provider, please arrive at least 15 minutes before your  scheduled appointment time.   If you have a lab appointment with the Cancer Center please come in thru the Main Entrance and check in at the main information desk.  You need to re-schedule your appointment should you arrive 10 or more minutes late.  We strive to give you quality time with our providers, and arriving late affects you and other patients whose appointments are after yours.  Also, if you no show three or more times for appointments you may be dismissed from the clinic at the providers discretion.     Again, thank you for choosing [facility].  Our hope is that these requests will decrease the amount of time that you wait before being seen by our physicians.       _____________________________________________________________  Should you have questions after your visit to [facility], please contact our office at [phone] and follow the prompts.  Our office hours are 8:00 a.m. and 4:30 p.m. Monday - Friday.  Please note that voicemails left after 4:00 p.m. may not be returned until the following business day.  We are closed weekends and major holidays.  You do have access to a nurse 24-7, just call the main number to the clinic [phone] and do not press any options, hold on the line and a nurse will answer the phone.  For prescription refill requests, have your pharmacy contact our office and allow 72 hours.

## 2024-03-04 ENCOUNTER — Other Ambulatory Visit: Payer: Self-pay

## 2024-03-04 ENCOUNTER — Inpatient Hospital Stay (HOSPITAL_COMMUNITY)
Admission: EM | Admit: 2024-03-04 | Discharge: 2024-03-09 | DRG: 865 | Disposition: A | Discharge status: Home Health | Attending: Family Medicine | Admitting: Family Medicine

## 2024-03-04 ENCOUNTER — Emergency Department (HOSPITAL_COMMUNITY)

## 2024-03-04 DIAGNOSIS — Z8249 Family history of ischemic heart disease and other diseases of the circulatory system: Secondary | ICD-10-CM | POA: Diagnosis not present

## 2024-03-04 DIAGNOSIS — S99921A Unspecified injury of right foot, initial encounter: Secondary | ICD-10-CM | POA: Diagnosis present

## 2024-03-04 DIAGNOSIS — E871 Hypo-osmolality and hyponatremia: Secondary | ICD-10-CM | POA: Diagnosis not present

## 2024-03-04 DIAGNOSIS — Z8673 Personal history of transient ischemic attack (TIA), and cerebral infarction without residual deficits: Secondary | ICD-10-CM | POA: Diagnosis not present

## 2024-03-04 DIAGNOSIS — D696 Thrombocytopenia, unspecified: Secondary | ICD-10-CM | POA: Diagnosis present

## 2024-03-04 DIAGNOSIS — I10 Essential (primary) hypertension: Secondary | ICD-10-CM | POA: Diagnosis present

## 2024-03-04 DIAGNOSIS — R413 Other amnesia: Secondary | ICD-10-CM | POA: Diagnosis not present

## 2024-03-04 DIAGNOSIS — X58XXXA Exposure to other specified factors, initial encounter: Secondary | ICD-10-CM | POA: Diagnosis not present

## 2024-03-04 DIAGNOSIS — E860 Dehydration: Secondary | ICD-10-CM | POA: Diagnosis present

## 2024-03-04 DIAGNOSIS — E861 Hypovolemia: Secondary | ICD-10-CM | POA: Diagnosis not present

## 2024-03-04 DIAGNOSIS — J40 Bronchitis, not specified as acute or chronic: Secondary | ICD-10-CM | POA: Diagnosis not present

## 2024-03-04 DIAGNOSIS — G9340 Encephalopathy, unspecified: Secondary | ICD-10-CM | POA: Diagnosis not present

## 2024-03-04 DIAGNOSIS — E785 Hyperlipidemia, unspecified: Secondary | ICD-10-CM | POA: Diagnosis present

## 2024-03-04 DIAGNOSIS — Z833 Family history of diabetes mellitus: Secondary | ICD-10-CM | POA: Diagnosis not present

## 2024-03-04 DIAGNOSIS — R1319 Other dysphagia: Secondary | ICD-10-CM | POA: Diagnosis present

## 2024-03-04 DIAGNOSIS — R1310 Dysphagia, unspecified: Secondary | ICD-10-CM

## 2024-03-04 DIAGNOSIS — Z7902 Long term (current) use of antithrombotics/antiplatelets: Secondary | ICD-10-CM | POA: Diagnosis not present

## 2024-03-04 DIAGNOSIS — Z888 Allergy status to other drugs, medicaments and biological substances status: Secondary | ICD-10-CM

## 2024-03-04 DIAGNOSIS — J449 Chronic obstructive pulmonary disease, unspecified: Secondary | ICD-10-CM | POA: Diagnosis present

## 2024-03-04 DIAGNOSIS — J439 Emphysema, unspecified: Secondary | ICD-10-CM | POA: Diagnosis not present

## 2024-03-04 DIAGNOSIS — R4702 Dysphasia: Secondary | ICD-10-CM | POA: Diagnosis present

## 2024-03-04 DIAGNOSIS — E876 Hypokalemia: Secondary | ICD-10-CM | POA: Diagnosis present

## 2024-03-04 DIAGNOSIS — I129 Hypertensive chronic kidney disease with stage 1 through stage 4 chronic kidney disease, or unspecified chronic kidney disease: Secondary | ICD-10-CM | POA: Diagnosis present

## 2024-03-04 DIAGNOSIS — R058 Other specified cough: Secondary | ICD-10-CM | POA: Diagnosis not present

## 2024-03-04 DIAGNOSIS — Z86718 Personal history of other venous thrombosis and embolism: Secondary | ICD-10-CM

## 2024-03-04 DIAGNOSIS — E890 Postprocedural hypothyroidism: Secondary | ICD-10-CM | POA: Diagnosis present

## 2024-03-04 DIAGNOSIS — R0689 Other abnormalities of breathing: Secondary | ICD-10-CM | POA: Diagnosis not present

## 2024-03-04 DIAGNOSIS — G40909 Epilepsy, unspecified, not intractable, without status epilepticus: Secondary | ICD-10-CM | POA: Diagnosis present

## 2024-03-04 DIAGNOSIS — J9601 Acute respiratory failure with hypoxia: Secondary | ICD-10-CM | POA: Diagnosis not present

## 2024-03-04 DIAGNOSIS — D649 Anemia, unspecified: Secondary | ICD-10-CM | POA: Diagnosis present

## 2024-03-04 DIAGNOSIS — Z8579 Personal history of other malignant neoplasms of lymphoid, hematopoietic and related tissues: Secondary | ICD-10-CM

## 2024-03-04 DIAGNOSIS — N1832 Chronic kidney disease, stage 3b: Secondary | ICD-10-CM | POA: Diagnosis present

## 2024-03-04 DIAGNOSIS — Z96612 Presence of left artificial shoulder joint: Secondary | ICD-10-CM | POA: Diagnosis not present

## 2024-03-04 DIAGNOSIS — R54 Age-related physical debility: Secondary | ICD-10-CM | POA: Diagnosis present

## 2024-03-04 DIAGNOSIS — D631 Anemia in chronic kidney disease: Secondary | ICD-10-CM | POA: Diagnosis present

## 2024-03-04 DIAGNOSIS — Z7982 Long term (current) use of aspirin: Secondary | ICD-10-CM

## 2024-03-04 DIAGNOSIS — J189 Pneumonia, unspecified organism: Secondary | ICD-10-CM | POA: Diagnosis not present

## 2024-03-04 DIAGNOSIS — Z79899 Other long term (current) drug therapy: Secondary | ICD-10-CM | POA: Diagnosis not present

## 2024-03-04 DIAGNOSIS — J1081 Influenza due to other identified influenza virus with encephalopathy: Principal | ICD-10-CM | POA: Diagnosis present

## 2024-03-04 DIAGNOSIS — B957 Other staphylococcus as the cause of diseases classified elsewhere: Secondary | ICD-10-CM | POA: Diagnosis present

## 2024-03-04 DIAGNOSIS — K5900 Constipation, unspecified: Secondary | ICD-10-CM | POA: Diagnosis present

## 2024-03-04 DIAGNOSIS — E039 Hypothyroidism, unspecified: Secondary | ICD-10-CM | POA: Diagnosis present

## 2024-03-04 DIAGNOSIS — J101 Influenza due to other identified influenza virus with other respiratory manifestations: Secondary | ICD-10-CM | POA: Diagnosis present

## 2024-03-04 DIAGNOSIS — E46 Unspecified protein-calorie malnutrition: Secondary | ICD-10-CM | POA: Diagnosis present

## 2024-03-04 DIAGNOSIS — E8809 Other disorders of plasma-protein metabolism, not elsewhere classified: Secondary | ICD-10-CM | POA: Diagnosis not present

## 2024-03-04 DIAGNOSIS — K219 Gastro-esophageal reflux disease without esophagitis: Secondary | ICD-10-CM | POA: Diagnosis present

## 2024-03-04 DIAGNOSIS — D509 Iron deficiency anemia, unspecified: Secondary | ICD-10-CM | POA: Diagnosis present

## 2024-03-04 DIAGNOSIS — Z96611 Presence of right artificial shoulder joint: Secondary | ICD-10-CM | POA: Diagnosis present

## 2024-03-04 DIAGNOSIS — Z1152 Encounter for screening for COVID-19: Secondary | ICD-10-CM | POA: Diagnosis not present

## 2024-03-04 DIAGNOSIS — R404 Transient alteration of awareness: Secondary | ICD-10-CM | POA: Diagnosis not present

## 2024-03-04 DIAGNOSIS — R059 Cough, unspecified: Secondary | ICD-10-CM | POA: Diagnosis not present

## 2024-03-04 DIAGNOSIS — R0902 Hypoxemia: Secondary | ICD-10-CM | POA: Diagnosis present

## 2024-03-04 DIAGNOSIS — Z9842 Cataract extraction status, left eye: Secondary | ICD-10-CM

## 2024-03-04 DIAGNOSIS — Z87891 Personal history of nicotine dependence: Secondary | ICD-10-CM

## 2024-03-04 DIAGNOSIS — Z9841 Cataract extraction status, right eye: Secondary | ICD-10-CM

## 2024-03-04 DIAGNOSIS — G9341 Metabolic encephalopathy: Secondary | ICD-10-CM | POA: Diagnosis present

## 2024-03-04 DIAGNOSIS — D472 Monoclonal gammopathy: Secondary | ICD-10-CM | POA: Diagnosis present

## 2024-03-04 DIAGNOSIS — I6782 Cerebral ischemia: Secondary | ICD-10-CM | POA: Diagnosis not present

## 2024-03-04 DIAGNOSIS — Z79890 Hormone replacement therapy (postmenopausal): Secondary | ICD-10-CM | POA: Diagnosis not present

## 2024-03-04 DIAGNOSIS — R000 Tachycardia, unspecified: Secondary | ICD-10-CM | POA: Diagnosis not present

## 2024-03-04 DIAGNOSIS — Z96651 Presence of right artificial knee joint: Secondary | ICD-10-CM | POA: Diagnosis present

## 2024-03-04 DIAGNOSIS — S90221A Contusion of right lesser toe(s) with damage to nail, initial encounter: Secondary | ICD-10-CM | POA: Diagnosis not present

## 2024-03-04 LAB — PROTIME-INR
INR: 1.2 (ref 0.8–1.2)
Prothrombin Time: 15.3 s — ABNORMAL HIGH (ref 11.4–15.2)

## 2024-03-04 LAB — URINALYSIS, W/ REFLEX TO CULTURE (INFECTION SUSPECTED)
Bacteria, UA: NONE SEEN
Bilirubin Urine: NEGATIVE
Glucose, UA: NEGATIVE mg/dL
Ketones, ur: NEGATIVE mg/dL
Leukocytes,Ua: NEGATIVE
Nitrite: NEGATIVE
Protein, ur: 30 mg/dL — AB
Specific Gravity, Urine: 1.012 (ref 1.005–1.030)
pH: 9 — ABNORMAL HIGH (ref 5.0–8.0)

## 2024-03-04 LAB — COMPREHENSIVE METABOLIC PANEL
ALT: 11 U/L (ref 0–44)
AST: 20 U/L (ref 15–41)
Albumin: 3.4 g/dL — ABNORMAL LOW (ref 3.5–5.0)
Alkaline Phosphatase: 59 U/L (ref 38–126)
Anion gap: 10 (ref 5–15)
BUN: 16 mg/dL (ref 8–23)
CO2: 24 mmol/L (ref 22–32)
Calcium: 9 mg/dL (ref 8.9–10.3)
Chloride: 101 mmol/L (ref 98–111)
Creatinine, Ser: 1.03 mg/dL — ABNORMAL HIGH (ref 0.44–1.00)
GFR, Estimated: 51 mL/min — ABNORMAL LOW (ref >60)
Glucose, Bld: 117 mg/dL — ABNORMAL HIGH (ref 70–99)
Potassium: 3.4 mmol/L — ABNORMAL LOW (ref 3.5–5.1)
Sodium: 135 mmol/L (ref 135–145)
Total Bilirubin: 0.6 mg/dL (ref 0.0–1.2)
Total Protein: 7.2 g/dL (ref 6.5–8.1)

## 2024-03-04 LAB — CBC WITH DIFFERENTIAL/PLATELET
Abs Immature Granulocytes: 0.03 10*3/uL (ref 0.00–0.07)
Basophils Absolute: 0 10*3/uL (ref 0.0–0.1)
Basophils Relative: 1 %
Eosinophils Absolute: 0.2 10*3/uL (ref 0.0–0.5)
Eosinophils Relative: 3 %
HCT: 30.5 % — ABNORMAL LOW (ref 36.0–46.0)
Hemoglobin: 9.8 g/dL — ABNORMAL LOW (ref 12.0–15.0)
Immature Granulocytes: 1 %
Lymphocytes Relative: 12 %
Lymphs Abs: 0.8 10*3/uL (ref 0.7–4.0)
MCH: 29.5 pg (ref 26.0–34.0)
MCHC: 32.1 g/dL (ref 30.0–36.0)
MCV: 91.9 fL (ref 80.0–100.0)
Monocytes Absolute: 1.1 10*3/uL — ABNORMAL HIGH (ref 0.1–1.0)
Monocytes Relative: 18 %
Neutro Abs: 4.1 10*3/uL (ref 1.7–7.7)
Neutrophils Relative %: 65 %
Platelets: 127 10*3/uL — ABNORMAL LOW (ref 150–400)
RBC: 3.32 MIL/uL — ABNORMAL LOW (ref 3.87–5.11)
RDW: 14.6 % (ref 11.5–15.5)
WBC: 6.3 10*3/uL (ref 4.0–10.5)
nRBC: 0 % (ref 0.0–0.2)

## 2024-03-04 LAB — RESP PANEL BY RT-PCR (RSV, FLU A&B, COVID)  RVPGX2
Influenza A by PCR: POSITIVE — AB
Influenza B by PCR: NEGATIVE
Resp Syncytial Virus by PCR: NEGATIVE
SARS Coronavirus 2 by RT PCR: NEGATIVE

## 2024-03-04 LAB — LACTIC ACID, PLASMA
Lactic Acid, Venous: 1.3 mmol/L (ref 0.5–1.9)
Lactic Acid, Venous: 0.8 mmol/L (ref 0.5–1.9)

## 2024-03-04 LAB — APTT: aPTT: 32 s (ref 24–36)

## 2024-03-04 MED ORDER — SODIUM CHLORIDE 0.9 % IV SOLN
1.0000 g | Freq: Once | INTRAVENOUS | Status: AC
  Administered 2024-03-04: 1 g via INTRAVENOUS
  Filled 2024-03-04: qty 10

## 2024-03-04 MED ORDER — SODIUM CHLORIDE 0.9 % IV SOLN
500.0000 mg | Freq: Once | INTRAVENOUS | Status: AC
  Administered 2024-03-04: 500 mg via INTRAVENOUS
  Filled 2024-03-04: qty 5

## 2024-03-04 MED ORDER — ACETAMINOPHEN 10 MG/ML IV SOLN
1000.0000 mg | Freq: Once | INTRAVENOUS | Status: AC
  Administered 2024-03-04: 1000 mg via INTRAVENOUS
  Filled 2024-03-04: qty 100

## 2024-03-04 MED ORDER — SODIUM CHLORIDE 0.9 % IV SOLN: 2.0000 g | Freq: Once | INTRAVENOUS | Status: DC

## 2024-03-04 MED ORDER — LACTATED RINGERS IV SOLN: INTRAVENOUS | Status: AC

## 2024-03-04 NOTE — Sepsis Progress Note (Signed)
 Code Sepsis protocol being monitored by eLink.

## 2024-03-04 NOTE — ED Provider Notes (Signed)
 [place] EMERGENCY DEPARTMENT AT [facility] Provider Note   CSN: [id number] Arrival date & time: 03/04/24  1617     History  Chief Complaint  Patient presents with   Code Sepsis    Christine Leblanc is a 89 y.o. female.  89 year old female with past medical history of hypothyroidism and hyperlipidemia presenting to emergency department today with apparent fatigue, fever, and altered mental status.  Patient coming in from home.  Per medics the patient has been weaker than normal over the past few days there is brought into the ER for further evaluation.  She was found to be febrile.        Home Medications Prior to Admission medications   Medication Sig Start Date End Date Taking? Authorizing Provider  aspirin EC 81 MG tablet Take 1 tablet (81 mg total) by mouth daily. Swallow whole. 01/08/24 04/07/24  Sherryll Burger, Pratik D, DO  atorvastatin (LIPITOR) 40 MG tablet Take 1 tablet (40 mg total) by mouth at bedtime. Patient not taking: Reported on 03/02/2024 01/08/24   Maurilio Lovely D, DO  Carbinoxamine Maleate 4 MG TABS Take 1 tablet (4 mg total) by mouth at bedtime. 01/02/23 02/06/24  Alfonse Spruce, MD  clopidogrel (PLAVIX) 75 MG tablet Take 1 tablet (75 mg total) by mouth daily. Patient not taking: Reported on 03/02/2024 01/08/24   Maurilio Lovely D, DO  diazepam (VALIUM) 5 MG tablet Take 2.5 mg by mouth daily as needed for anxiety. Patient not taking: Reported on 03/02/2024 05/29/23   [provider]  ferrous sulfate 325 (65 FE) MG tablet Take 325 mg by mouth. Takes one on Monday, Wednesday and Friday    [provider]  fluticasone (FLONASE) 50 MCG/ACT nasal spray Place 2 sprays into both nostrils daily. 12/02/23   Birder Robson, MD  ipratropium (ATROVENT) 0.06 % nasal spray Place 1 spray into both nostrils 4 (four) times daily as needed for rhinitis. 12/02/23   Birder Robson, MD  levothyroxine (SYNTHROID) 88 MCG tablet TAKE 1 TABLET DAILY ON MONDAY THROUGH SATURDAY AND  ONE-HALF (1/2) TABLET ON SUNDAY Patient taking differently: Take 88 mcg by mouth See admin instructions. TAKE 1 TABLET DAILY ON MONDAY THROUGH SATURDAY AND ONE-HALF (1/2) TABLET ON SUNDAY 10/10/22   Reather Littler, MD  pantoprazole (PROTONIX) 40 MG tablet Take 1 tablet (40 mg total) by mouth 2 (two) times daily before a meal. 11/06/23   Letta Median, PA-C  senna (SENOKOT) 8.6 MG tablet Take 2 tablets by mouth at bedtime.    [provider]      Allergies    Celecoxib, Dexlansoprazole, and Famotidine    Review of Systems   Review of Systems  Reason unable to perform ROS: Altered mental status.    Physical Exam Updated Vital Signs BP (!) 149/77   Pulse 86   Temp 99.2 F (37.3 C) (Axillary)   Resp (!) 21   SpO2 98%  Physical Exam Vitals and nursing note reviewed.   Gen: Somnolent but will arouse to verbal stimuli Eyes: PERRL, EOMI HEENT: no oropharyngeal swelling Neck: trachea midline, no meningismus Resp: Diminished at bilateral lung bases Card: Tachycardic, no murmurs, rubs, or gallops Abd: nontender, nondistended Extremities: no calf tenderness, no edema Vascular: 2+ radial pulses bilaterally, 2+ DP pulses bilaterally Neuro: Will move all extremities on command Skin: no rashes   ED Results / Procedures / Treatments   Labs (all labs ordered are listed, but only abnormal results are displayed) Labs Reviewed  RESP PANEL BY RT-PCR (RSV, FLU A&B, COVID)  RVPGX2 - Abnormal; Notable for the following components:      Result Value   Influenza A by PCR POSITIVE (*)    All other components within normal limits  COMPREHENSIVE METABOLIC PANEL - Abnormal; Notable for the following components:   Potassium 3.4 (*)    Glucose, Bld 117 (*)    Creatinine, Ser 1.03 (*)    Albumin 3.4 (*)    GFR, Estimated 51 (*)    All other components within normal limits  CBC WITH DIFFERENTIAL/PLATELET - Abnormal; Notable for the following components:   RBC 3.32 (*)    Hemoglobin 9.8  (*)    HCT 30.5 (*)    Platelets 127 (*)    Monocytes Absolute 1.1 (*)    All other components within normal limits  PROTIME-INR - Abnormal; Notable for the following components:   Prothrombin Time 15.3 (*)    All other components within normal limits  URINALYSIS, W/ REFLEX TO CULTURE (INFECTION SUSPECTED) - Abnormal; Notable for the following components:   pH 9.0 (*)    Hgb urine dipstick MODERATE (*)    Protein, ur 30 (*)    All other components within normal limits  CULTURE, BLOOD (ROUTINE X 2)  CULTURE, BLOOD (ROUTINE X 2)  URINE CULTURE  LACTIC ACID, PLASMA  LACTIC ACID, PLASMA  APTT    EKG EKG Interpretation Date/Time:  Wednesday March 04 2024 16:27:58 EST Ventricular Rate:  101 PR Interval:  160 QRS Duration:  127 QT Interval:  374 QTC Calculation: 485 R Axis:   92  Text Interpretation: Sinus tachycardia RBBB and LPFB Confirmed by Beckey Downing [phone]) on 03/04/2024 4:44:05 PM  Radiology CT Head Wo Contrast Result Date: 03/04/2024 CLINICAL DATA:  Memory loss EXAM: CT HEAD WITHOUT CONTRAST TECHNIQUE: Contiguous axial images were obtained from the base of the skull through the vertex without intravenous contrast. RADIATION DOSE REDUCTION: This exam was performed according to the departmental dose-optimization program which includes automated exposure control, adjustment of the mA and/or kV according to patient size and/or use of iterative reconstruction technique. COMPARISON:  CT brain 12/09/2023, MRI 01/06/2024 FINDINGS: Brain: No acute territorial infarction, hemorrhage or intracranial mass. Atrophy and chronic small vessel ischemic changes of the white matter. Chronic right ganglial capsular infarct. Stable ventricle size Vascular: No hyperdense vessels.  Carotid vascular calcification Skull: Normal. Negative for fracture or focal lesion. Sinuses/Orbits: Mucosal thickening in the sinuses Other: None IMPRESSION: No CT evidence for acute intracranial abnormality. Atrophy and  chronic small vessel ischemic changes of the white matter Electronically Signed   By: Jasmine Pang M.D.   On: 03/04/2024 19:56   DG Chest Portable 1 View Result Date: 03/04/2024 CLINICAL DATA:  Productive cough. Fever. Altered mental status. Tachycardia. EXAM: PORTABLE CHEST 1 VIEW COMPARISON:  01/20/2024 FINDINGS: Normal sized heart. Tortuous and partially calcified thoracic aorta. Stable biapical pleural and parenchymal scarring interstitial fibrosis in the upper lobes. The remainder of the lungs remain clear. No pleural fluid. Bilateral shoulder prostheses. Bilateral lower neck surgical clips IMPRESSION: 1. No acute abnormality. 2. Stable biapical pleural and parenchymal scarring and interstitial fibrosis in the upper lobes. 3. Aortic atherosclerosis. Electronically Signed   By: Beckie Salts M.D.   On: 03/04/2024 18:47    Procedures Procedures    Medications Ordered in ED Medications  lactated ringers infusion ( Intravenous New Bag/Given 03/04/24 1928)  azithromycin (ZITHROMAX) 500 mg in sodium chloride 0.9 % 250 mL IVPB (0 mg Intravenous  Stopped 03/04/24 1859)  acetaminophen (OFIRMEV) IV 1,000 mg (0 mg Intravenous Stopped 03/04/24 1859)  cefTRIAXone (ROCEPHIN) 1 g in sodium chloride 0.9 % 100 mL IVPB (0 g Intravenous Stopped 03/04/24 1859)    ED Course/ Medical Decision Making/ A&P                                 Medical Decision Making 89 year old female with past medical history of hyperlipidemia and hypothyroidism presenting to the emergency department today with fever, fatigue, and confusion.  I will further evaluate patient here with a sepsis workup.  Will obtain an RSV/COVID/flu swab in addition to a chest x-ray and urinalysis to evaluate for underlying infectious etiologies.  I will give the patient offer Mab for her fever.  Her blood pressure is on the high side initially.  Will hold off on IV fluids given her age until her lactic acid results.  Also obtain a CT scan to eval for  intracranial abnormalities.  She will require admission more than likely.  The patient's labs here are reassuring.  Her flu test is positive.  CT scan does not show any acute intracranial abnormalities.  She is still not back to her baseline so a call is placed to the hospitalist service for admission.  Amount and/or Complexity of Data Reviewed Labs: ordered. Radiology: ordered.  Risk Prescription drug management. Decision regarding hospitalization.           Final Clinical Impression(s) / ED Diagnoses Final diagnoses:  Encephalopathy, unspecified type  Influenza A    Rx / DC Orders ED Discharge Orders     None         Durwin Glaze, MD 03/04/24 2213

## 2024-03-04 NOTE — ED Triage Notes (Signed)
 Pt bib ems from home with reports of altered mental status. Pt normally able to hold a conversation. Pt not following commands. Pt coughing up thick green. Febrile at 101 with ems. Tachycardic in the 110's. BP 90/50. Given 1 gram Rocephin and 100cc NS en route.

## 2024-03-04 NOTE — H&P (Signed)
 History and Physical    Patient: Christine Leblanc ZOX:[id number] DOB: 09-Mar-1929 DOA: 03/04/2024 DOS: the patient was seen and examined on 03/05/2024 PCP: Benita Stabile, MD  Patient coming from: Home  Chief Complaint:  Chief Complaint  Patient presents with   Code Sepsis   HPI: Christine Leblanc is a 89 y.o. female with medical history significant of hypertension, MGUS, COPD, seizures who presents to the emergency department from home via EMS due to altered mental status.  At bedside, patient was somnolent, though easily arousable, but was unable to provide history.  History was obtained from EDP and son at bedside.  Per report, patient usually was able to maintain conversation at baseline.  However, since 1 week ago, she has been coughing with production of greenish sputum which has since become thicker since onset of symptoms.  Patient was still able to maintain normal conversation as of last night, but on waking up this morning, her conversation has cognitively declined.  EMS was activated and arrival of EMS team, temperature was noted to be 101F and she was tachycardic with BP of 90/50.  1 g of Rocephin and 100 cc of NS was given en route.  ED Course:  In the emergency department, she was febrile with a temperature of 1-2.1, respiratory rate 20/min, pulse 88 bpm, BP 180/90, O2 sat was 98% on room air.  Workup in the ED showed normocytic anemia and thrombocytopenia.  BMP was normal except for hypokalemia and blood glucose of 117.  Creatinine 1.03, albumin 3.4.  Urinalysis was normal, lactic acid was normal.  Influenza A was positive.  Influenza B, RSV and SARS coronavirus 2 was negative. CT head without contrast showed no acute intracranial abnormality Chest x-ray showed no acute abnormality Patient was initially started with IV ceftriaxone and azithromycin due to presumption that patient has pneumonia.  Acetaminophen was given due to fever. Hospitalist was asked to admit patient for further evaluation  and management.  Review of Systems: Review of systems as noted in the HPI. All other systems reviewed and are negative.   Past Medical History:  Diagnosis Date   Anemia    Asthma    Baker's cyst    Cancer (HCC)    skin Ca- ? basal cell    Chronic back pain    Chronic neck pain    Degenerative joint disease    Left shoulder; cervical spine, knees & hands    Gastroesophageal reflux disease    Hiatal hernia; distal esophageal web requiring dilatation; gastric polyps; gastritis; refuses colonoscopy   GERD (gastroesophageal reflux disease)    History of stress test 1990's   stress test done under the care of Dr. Dietrich Pates & Dr. Allyson Sabal, now being followed by Dr. Wyline Mood- in [place] , recently seen & told to f/U in one yr.    Hyperlipidemia 04/27/2011   Hypertension    Hypothyroidism    Lymphocytic thyroiditis    Mild carotid artery disease (HCC)    Multiple thyroid nodules 2010   Adenomatous; thyroidectomy in 2010   Orthostasis    Osteoarthritis of right knee 07/27/2014   Pneumonia    hosp. for pneumonia- long time ago    Primary localized osteoarthrosis of right shoulder 06/21/2015   Seizures (HCC)    yes- as a child- & into adult years, states she took med. for them at one time, stopped at 32 yrs. of age    Syncope and collapse 2007   Possible CVA in 2012 with left lower  extremity weakness; refused hospitalization; CT-Atrophy and chronic microvascular ischemic change.    Past Surgical History:  Procedure Laterality Date   ABDOMINAL HYSTERECTOMY     fibroids   CATARACT EXTRACTION     Bilateral; redo surgery on the right for incomplete primary procedure   COLONOSCOPY  Remote   ESOPHAGOGASTRODUODENOSCOPY  11/2009   Dr. Darrick Penna: probable distal web s/p dilation small hiatal hernia/gastric polyps/mild gastritis, appeared to have narrowing at the junction of D1 and D2 dilated up to 12 mm   ESOPHAGOGASTRODUODENOSCOPY N/A 03/21/2015   Surgeon: West Bali, MD; proximal  esophageal web s/p dilation, multiple gastric polyps s/p multiple biopsies, mild erosive gastritis s/p biopsy.  Pathology with fundic gland polyps, chronic gastritis, negative for H. pylori.   EYE SURGERY     PROLAPSED UTERINE FIBROID LIGATION     outcomed with rectocele & cystocele   SAVORY DILATION N/A 03/21/2015   Procedure: SAVORY DILATION;  Surgeon: West Bali, MD;  Location: [facility]ITE;  Service: Endoscopy;  Laterality: N/A;   SHOULDER SURGERY     left   TOTAL KNEE ARTHROPLASTY Right 07/27/2014   Procedure: RIGHT TOTAL KNEE ARTHROPLASTY;  Surgeon: Eulas Post, MD;  Location: [facility] OR;  Service: Orthopedics;  Laterality: Right;   TOTAL SHOULDER ARTHROPLASTY Right 06/21/2015   Procedure: RIGHT TOTAL SHOULDER ARTHROPLASTY;  Surgeon: Teryl Lucy, MD;  Location: [facility] OR;  Service: Orthopedics;  Laterality: Right;   TOTAL THYROIDECTOMY  2010    Social History:  reports that she quit smoking about 45 years ago. Her smoking use included cigarettes. She started smoking about 65 years ago. She has a 16 pack-year smoking history. She has never used smokeless tobacco. She reports that she does not currently use alcohol. She reports that she does not use drugs.   Allergies  Allergen Reactions   Celecoxib Shortness Of Breath   Dexlansoprazole Anaphylaxis and Other (See Comments)    abd pain   Famotidine Other (See Comments)    Makes her feel "very bad"    Family History  Problem Relation Age of Onset   Heart disease Mother    Gallbladder disease Mother    Heart failure Mother    Diabetes Mother    Breast cancer Sister        30 years ago   Diabetes Sister    Heart attack Father    Diabetes Maternal Grandmother    Colon cancer Neg Hx    Stroke Neg Hx      Prior to Admission medications   Medication Sig Start Date End Date Taking? Authorizing Provider  aspirin EC 81 MG tablet Take 1 tablet (81 mg total) by mouth daily. Swallow whole. 01/08/24 04/07/24  Sherryll Burger, Pratik D, DO   atorvastatin (LIPITOR) 40 MG tablet Take 1 tablet (40 mg total) by mouth at bedtime. Patient not taking: Reported on 03/02/2024 01/08/24   Maurilio Lovely D, DO  Carbinoxamine Maleate 4 MG TABS Take 1 tablet (4 mg total) by mouth at bedtime. 01/02/23 02/06/24  Alfonse Spruce, MD  clopidogrel (PLAVIX) 75 MG tablet Take 1 tablet (75 mg total) by mouth daily. Patient not taking: Reported on 03/02/2024 01/08/24   Maurilio Lovely D, DO  diazepam (VALIUM) 5 MG tablet Take 2.5 mg by mouth daily as needed for anxiety. Patient not taking: Reported on 03/02/2024 05/29/23   [provider]  ferrous sulfate 325 (65 FE) MG tablet Take 325 mg by mouth. Takes one on Monday, Wednesday and Friday  [provider]  fluticasone (FLONASE) 50 MCG/ACT nasal spray Place 2 sprays into both nostrils daily. 12/02/23   Birder Robson, MD  ipratropium (ATROVENT) 0.06 % nasal spray Place 1 spray into both nostrils 4 (four) times daily as needed for rhinitis. 12/02/23   Birder Robson, MD  levothyroxine (SYNTHROID) 88 MCG tablet TAKE 1 TABLET DAILY ON MONDAY THROUGH SATURDAY AND ONE-HALF (1/2) TABLET ON SUNDAY Patient taking differently: Take 88 mcg by mouth See admin instructions. TAKE 1 TABLET DAILY ON MONDAY THROUGH SATURDAY AND ONE-HALF (1/2) TABLET ON SUNDAY 10/10/22   Reather Littler, MD  pantoprazole (PROTONIX) 40 MG tablet Take 1 tablet (40 mg total) by mouth 2 (two) times daily before a meal. 11/06/23   Letta Median, PA-C  senna (SENOKOT) 8.6 MG tablet Take 2 tablets by mouth at bedtime.    [provider]    Physical Exam: BP (!) 149/77   Pulse 89   Temp 99.2 F (37.3 C) (Axillary)   Resp (!) 21   SpO2 100%   General: 89 y.o. year-old female ill appearing, somnolent, though easily arousable and in no acute distress.  HEENT: NCAT, PERRL, dry mucous membrane Neck: Supple, trachea medial Cardiovascular: Regular rate and rhythm with no rubs or gallops.  No thyromegaly or JVD noted.  No lower  extremity edema. 2/4 pulses in all 4 extremities. Respiratory: Coarse breath sounds with scattered expiratory wheezing. Abdomen: Soft, nontender nondistended with normal bowel sounds x4 quadrants. Muskuloskeletal: No cyanosis, clubbing or edema noted bilaterally Neuro: Patient moving all extremities.  No focal neurologic deficit. Skin: Decreased skin turgor.  No ulcerative lesions noted or rashes Psychiatry: This cannot be assessed at this time due to patient's current condition         Labs on Admission:  Basic Metabolic Panel: Recent Labs  Lab 03/04/24 1630  NA 135  K 3.4*  CL 101  CO2 24  GLUCOSE 117*  BUN 16  CREATININE 1.03*  CALCIUM 9.0   Liver Function Tests: Recent Labs  Lab 03/04/24 1630  AST 20  ALT 11  ALKPHOS 59  BILITOT 0.6  PROT 7.2  ALBUMIN 3.4*   No results for input(s): "LIPASE", "AMYLASE" in the last 168 hours. No results for input(s): "AMMONIA" in the last 168 hours. CBC: Recent Labs  Lab 03/04/24 1630  WBC 6.3  NEUTROABS 4.1  HGB 9.8*  HCT 30.5*  MCV 91.9  PLT 127*   Cardiac Enzymes: No results for input(s): "CKTOTAL", "CKMB", "CKMBINDEX", "TROPONINI" in the last 168 hours.  BNP (last 3 results) No results for input(s): "BNP" in the last 8760 hours.  ProBNP (last 3 results) No results for input(s): "PROBNP" in the last 8760 hours.  CBG: No results for input(s): "GLUCAP" in the last 168 hours.  Radiological Exams on Admission: CT Head Wo Contrast Result Date: 03/04/2024 CLINICAL DATA:  Memory loss EXAM: CT HEAD WITHOUT CONTRAST TECHNIQUE: Contiguous axial images were obtained from the base of the skull through the vertex without intravenous contrast. RADIATION DOSE REDUCTION: This exam was performed according to the departmental dose-optimization program which includes automated exposure control, adjustment of the mA and/or kV according to patient size and/or use of iterative reconstruction technique. COMPARISON:  CT brain 12/09/2023, MRI  01/06/2024 FINDINGS: Brain: No acute territorial infarction, hemorrhage or intracranial mass. Atrophy and chronic small vessel ischemic changes of the white matter. Chronic right ganglial capsular infarct. Stable ventricle size Vascular: No hyperdense vessels.  Carotid vascular calcification Skull: Normal. Negative for  fracture or focal lesion. Sinuses/Orbits: Mucosal thickening in the sinuses Other: None IMPRESSION: No CT evidence for acute intracranial abnormality. Atrophy and chronic small vessel ischemic changes of the white matter Electronically Signed   By: Jasmine Pang M.D.   On: 03/04/2024 19:56   DG Chest Portable 1 View Result Date: 03/04/2024 CLINICAL DATA:  Productive cough. Fever. Altered mental status. Tachycardia. EXAM: PORTABLE CHEST 1 VIEW COMPARISON:  01/20/2024 FINDINGS: Normal sized heart. Tortuous and partially calcified thoracic aorta. Stable biapical pleural and parenchymal scarring interstitial fibrosis in the upper lobes. The remainder of the lungs remain clear. No pleural fluid. Bilateral shoulder prostheses. Bilateral lower neck surgical clips IMPRESSION: 1. No acute abnormality. 2. Stable biapical pleural and parenchymal scarring and interstitial fibrosis in the upper lobes. 3. Aortic atherosclerosis. Electronically Signed   By: Beckie Salts M.D.   On: 03/04/2024 18:47    EKG: I independently viewed the EKG done and my findings are as followed: Sinus tachycardia at a rate of 101 bpm with RBBB and LPFB  Assessment/Plan Present on Admission:  Influenza A  Other dysphagia  Dehydration  Hypokalemia  COPD (chronic obstructive pulmonary disease) (HCC)  Essential hypertension  Acquired hypothyroidism  Principal Problem:   Influenza A Active Problems:   COPD (chronic obstructive pulmonary disease) (HCC)   Other dysphagia   Essential hypertension   Dehydration   Acquired hypothyroidism   Hypokalemia   History of TIA (transient ischemic attack)   Dysphagia    Hypoalbuminemia due to protein-calorie malnutrition (HCC)  Influenza A Patient's symptoms started about a week ago Continue duo nebs, Mucinex, Robitussin Continue Tylenol as needed Continue incentive spirometry and flutter valve  Dehydration Continue gentle hydration  Hypokalemia K+ 3.4, this will be replenished  Hypoalbuminemia Albumin 3.4, protein supplement will be provided  Dysphagia Patient was on pured diet per son at bedside Speech therapist will be consulted for swallow eval in the morning prior to oral intake  Thrombocytopenia possibly reactive Platelets 127, continue to monitor platelet levels  COPD Continue home meds  MGUS (monoclonal gammopathy of unknown significance) Stable, patient follows with oncology  Essential hypertension Continue IV hydralazine 10 mg every 6 hours as needed for SBP > 170 No antihypertensive medication noted in patient's med rec  History of TIA Continue aspirin, Lipitor  Acquired hypothyroidism Continue Synthroid   DVT prophylaxis: Lovenox  Code Status: Full code  Family Communication: Son at bedside (all questions answered to satisfaction)  Consults: None  Severity of Illness: The appropriate patient status for this patient is INPATIENT. Inpatient status is judged to be reasonable and necessary in order to provide the required intensity of service to ensure the patient's safety. The patient's presenting symptoms, physical exam findings, and initial radiographic and laboratory data in the context of their chronic comorbidities is felt to place them at high risk for further clinical deterioration. Furthermore, it is not anticipated that the patient will be medically stable for discharge from the hospital within 2 midnights of admission.   * I certify that at the point of admission it is my clinical judgment that the patient will require inpatient hospital care spanning beyond 2 midnights from the point of admission due to high  intensity of service, high risk for further deterioration and high frequency of surveillance required.*  Author: Frankey Shown, DO 03/05/2024 1:06 AM  For on call review [email or web address].

## 2024-03-05 ENCOUNTER — Other Ambulatory Visit: Payer: Self-pay

## 2024-03-05 ENCOUNTER — Encounter (HOSPITAL_COMMUNITY): Payer: Self-pay | Admitting: Internal Medicine

## 2024-03-05 DIAGNOSIS — J439 Emphysema, unspecified: Secondary | ICD-10-CM

## 2024-03-05 DIAGNOSIS — I10 Essential (primary) hypertension: Secondary | ICD-10-CM | POA: Diagnosis not present

## 2024-03-05 DIAGNOSIS — E46 Unspecified protein-calorie malnutrition: Secondary | ICD-10-CM | POA: Insufficient documentation

## 2024-03-05 DIAGNOSIS — E876 Hypokalemia: Secondary | ICD-10-CM | POA: Diagnosis not present

## 2024-03-05 DIAGNOSIS — J101 Influenza due to other identified influenza virus with other respiratory manifestations: Secondary | ICD-10-CM | POA: Diagnosis not present

## 2024-03-05 DIAGNOSIS — R1319 Other dysphagia: Secondary | ICD-10-CM

## 2024-03-05 DIAGNOSIS — E039 Hypothyroidism, unspecified: Secondary | ICD-10-CM | POA: Diagnosis not present

## 2024-03-05 DIAGNOSIS — J9601 Acute respiratory failure with hypoxia: Secondary | ICD-10-CM | POA: Diagnosis not present

## 2024-03-05 DIAGNOSIS — Z8673 Personal history of transient ischemic attack (TIA), and cerebral infarction without residual deficits: Secondary | ICD-10-CM

## 2024-03-05 DIAGNOSIS — E8809 Other disorders of plasma-protein metabolism, not elsewhere classified: Secondary | ICD-10-CM | POA: Diagnosis not present

## 2024-03-05 DIAGNOSIS — R1310 Dysphagia, unspecified: Secondary | ICD-10-CM | POA: Diagnosis not present

## 2024-03-05 DIAGNOSIS — E860 Dehydration: Secondary | ICD-10-CM | POA: Diagnosis not present

## 2024-03-05 LAB — COMPREHENSIVE METABOLIC PANEL
ALT: 12 U/L (ref 0–44)
AST: 24 U/L (ref 15–41)
Albumin: 3.1 g/dL — ABNORMAL LOW (ref 3.5–5.0)
Alkaline Phosphatase: 54 U/L (ref 38–126)
Anion gap: 9 (ref 5–15)
BUN: 14 mg/dL (ref 8–23)
CO2: 26 mmol/L (ref 22–32)
Calcium: 8.5 mg/dL — ABNORMAL LOW (ref 8.9–10.3)
Chloride: 100 mmol/L (ref 98–111)
Creatinine, Ser: 0.95 mg/dL (ref 0.44–1.00)
GFR, Estimated: 56 mL/min — ABNORMAL LOW (ref >60)
Glucose, Bld: 100 mg/dL — ABNORMAL HIGH (ref 70–99)
Potassium: 3.6 mmol/L (ref 3.5–5.1)
Sodium: 135 mmol/L (ref 135–145)
Total Bilirubin: 0.4 mg/dL (ref 0.0–1.2)
Total Protein: 6.8 g/dL (ref 6.5–8.1)

## 2024-03-05 LAB — CBC
HCT: 31.9 % — ABNORMAL LOW (ref 36.0–46.0)
Hemoglobin: 10.1 g/dL — ABNORMAL LOW (ref 12.0–15.0)
MCH: 29.3 pg (ref 26.0–34.0)
MCHC: 31.7 g/dL (ref 30.0–36.0)
MCV: 92.5 fL (ref 80.0–100.0)
Platelets: 112 10*3/uL — ABNORMAL LOW (ref 150–400)
RBC: 3.45 MIL/uL — ABNORMAL LOW (ref 3.87–5.11)
RDW: 14.6 % (ref 11.5–15.5)
WBC: 4 10*3/uL (ref 4.0–10.5)
nRBC: 0 % (ref 0.0–0.2)

## 2024-03-05 LAB — MAGNESIUM: Magnesium: 1.5 mg/dL — ABNORMAL LOW (ref 1.7–2.4)

## 2024-03-05 LAB — PHOSPHORUS: Phosphorus: 3.1 mg/dL (ref 2.5–4.6)

## 2024-03-05 LAB — PROCALCITONIN: Procalcitonin: <0.1 ng/mL

## 2024-03-05 MED ORDER — DM-GUAIFENESIN ER 30-600 MG PO TB12
1.0000 | ORAL_TABLET | Freq: Two times a day (BID) | ORAL | Status: DC
  Administered 2024-03-05 – 2024-03-09 (×9): 1 via ORAL
  Filled 2024-03-05 (×9): qty 1

## 2024-03-05 MED ORDER — ENOXAPARIN SODIUM 30 MG/0.3ML IJ SOSY
30.0000 mg | PREFILLED_SYRINGE | INTRAMUSCULAR | Status: DC
  Administered 2024-03-05 – 2024-03-09 (×5): 30 mg via SUBCUTANEOUS
  Filled 2024-03-05 (×5): qty 0.3

## 2024-03-05 MED ORDER — OXYCODONE HCL 5 MG PO TABS: 5.0000 mg | ORAL_TABLET | Freq: Once | ORAL | Status: DC | PRN

## 2024-03-05 MED ORDER — LEVOTHYROXINE SODIUM 88 MCG PO TABS
88.0000 ug | ORAL_TABLET | Freq: Every day | ORAL | Status: DC
  Administered 2024-03-06 – 2024-03-09 (×4): 88 ug via ORAL
  Filled 2024-03-05 (×4): qty 1

## 2024-03-05 MED ORDER — ONDANSETRON HCL 4 MG PO TABS: 4.0000 mg | ORAL_TABLET | Freq: Four times a day (QID) | ORAL | Status: DC | PRN

## 2024-03-05 MED ORDER — ATORVASTATIN CALCIUM 40 MG PO TABS
40.0000 mg | ORAL_TABLET | Freq: Every day | ORAL | Status: DC
  Administered 2024-03-05 – 2024-03-08 (×4): 40 mg via ORAL
  Filled 2024-03-05 (×4): qty 1

## 2024-03-05 MED ORDER — ONDANSETRON HCL 4 MG/2ML IJ SOLN: 4.0000 mg | Freq: Four times a day (QID) | INTRAMUSCULAR | Status: DC | PRN

## 2024-03-05 MED ORDER — POTASSIUM CHLORIDE 10 MEQ/100ML IV SOLN
10.0000 meq | INTRAVENOUS | Status: AC
  Administered 2024-03-05 (×2): 10 meq via INTRAVENOUS
  Filled 2024-03-05 (×2): qty 100

## 2024-03-05 MED ORDER — FLEET ENEMA RE ENEM
1.0000 | ENEMA | Freq: Every day | RECTAL | Status: DC | PRN
  Administered 2024-03-05: 1 via RECTAL

## 2024-03-05 MED ORDER — ACETAMINOPHEN 325 MG PO TABS
650.0000 mg | ORAL_TABLET | Freq: Four times a day (QID) | ORAL | Status: DC | PRN
  Administered 2024-03-05 (×2): 650 mg via ORAL
  Filled 2024-03-05 (×2): qty 2

## 2024-03-05 MED ORDER — SODIUM CHLORIDE 0.9 % IV SOLN: INTRAVENOUS | Status: AC

## 2024-03-05 MED ORDER — IPRATROPIUM BROMIDE 0.06 % NA SOLN
1.0000 | Freq: Four times a day (QID) | NASAL | Status: DC | PRN
  Filled 2024-03-05: qty 15

## 2024-03-05 MED ORDER — ASPIRIN 81 MG PO TBEC
81.0000 mg | DELAYED_RELEASE_TABLET | Freq: Every day | ORAL | Status: DC
  Administered 2024-03-06 – 2024-03-09 (×4): 81 mg via ORAL
  Filled 2024-03-05 (×4): qty 1

## 2024-03-05 MED ORDER — CARMEX CLASSIC LIP BALM EX OINT: 1.0000 | TOPICAL_OINTMENT | CUTANEOUS | Status: DC | PRN

## 2024-03-05 MED ORDER — ENSURE ENLIVE PO LIQD
237.0000 mL | Freq: Two times a day (BID) | ORAL | Status: DC
  Filled 2024-03-05 (×4): qty 237

## 2024-03-05 MED ORDER — LEVOTHYROXINE SODIUM 88 MCG PO TABS: 88.0000 ug | ORAL_TABLET | ORAL | Status: DC

## 2024-03-05 MED ORDER — GUAIFENESIN-DM 100-10 MG/5ML PO SYRP: 5.0000 mL | ORAL_SOLUTION | ORAL | Status: DC | PRN

## 2024-03-05 MED ORDER — SALINE SPRAY 0.65 % NA SOLN: 1.0000 | NASAL | Status: DC | PRN

## 2024-03-05 MED ORDER — ACETAMINOPHEN 650 MG RE SUPP
325.0000 mg | Freq: Four times a day (QID) | RECTAL | Status: DC | PRN
Start: 2024-03-05 — End: 2024-03-09

## 2024-03-05 MED ORDER — HYDRALAZINE HCL 20 MG/ML IJ SOLN: 10.0000 mg | Freq: Four times a day (QID) | INTRAMUSCULAR | Status: DC | PRN

## 2024-03-05 MED ORDER — IPRATROPIUM-ALBUTEROL 0.5-2.5 (3) MG/3ML IN SOLN: 3.0000 mL | RESPIRATORY_TRACT | Status: DC | PRN

## 2024-03-05 MED ORDER — LABETALOL HCL 5 MG/ML IV SOLN
10.0000 mg | INTRAVENOUS | Status: DC | PRN
Start: 2024-03-05 — End: 2024-03-09

## 2024-03-05 NOTE — Evaluation (Signed)
 Clinical/Bedside Swallow Evaluation Patient Details  Name: Christine Leblanc MRN: [id number] Date of Birth: 08-11-1929  Today's Date: 03/05/2024 Time: SLP Start Time (ACUTE ONLY): 1252 SLP Stop Time (ACUTE ONLY): 1315 SLP Time Calculation (min) (ACUTE ONLY): 23 min  Past Medical History:  Past Medical History:  Diagnosis Date   Anemia    Asthma    Baker's cyst    Cancer (HCC)    skin Ca- ? basal cell    Chronic back pain    Chronic neck pain    Degenerative joint disease    Left shoulder; cervical spine, knees & hands    Gastroesophageal reflux disease    Hiatal hernia; distal esophageal web requiring dilatation; gastric polyps; gastritis; refuses colonoscopy   GERD (gastroesophageal reflux disease)    History of stress test 1990's   stress test done under the care of Dr. Dietrich Pates & Dr. Allyson Sabal, now being followed by Dr. Wyline Mood- in [place] , recently seen & told to f/U in one yr.    Hyperlipidemia 04/27/2011   Hypertension    Hypothyroidism    Lymphocytic thyroiditis    Mild carotid artery disease (HCC)    Multiple thyroid nodules 2010   Adenomatous; thyroidectomy in 2010   Orthostasis    Osteoarthritis of right knee 07/27/2014   Pneumonia    hosp. for pneumonia- long time ago    Primary localized osteoarthrosis of right shoulder 06/21/2015   Seizures (HCC)    yes- as a child- & into adult years, states she took med. for them at one time, stopped at 32 yrs. of age    Syncope and collapse 2007   Possible CVA in 2012 with left lower extremity weakness; refused hospitalization; CT-Atrophy and chronic microvascular ischemic change.    Past Surgical History:  Past Surgical History:  Procedure Laterality Date   ABDOMINAL HYSTERECTOMY     fibroids   CATARACT EXTRACTION     Bilateral; redo surgery on the right for incomplete primary procedure   COLONOSCOPY  Remote   ESOPHAGOGASTRODUODENOSCOPY  11/2009   Dr. Darrick Penna: probable distal web s/p dilation small hiatal hernia/gastric  polyps/mild gastritis, appeared to have narrowing at the junction of D1 and D2 dilated up to 12 mm   ESOPHAGOGASTRODUODENOSCOPY N/A 03/21/2015   Surgeon: West Bali, MD; proximal esophageal web s/p dilation, multiple gastric polyps s/p multiple biopsies, mild erosive gastritis s/p biopsy.  Pathology with fundic gland polyps, chronic gastritis, negative for H. pylori.   EYE SURGERY     PROLAPSED UTERINE FIBROID LIGATION     outcomed with rectocele & cystocele   SAVORY DILATION N/A 03/21/2015   Procedure: SAVORY DILATION;  Surgeon: West Bali, MD;  Location: [facility];  Service: Endoscopy;  Laterality: N/A;   SHOULDER SURGERY     left   TOTAL KNEE ARTHROPLASTY Right 07/27/2014   Procedure: RIGHT TOTAL KNEE ARTHROPLASTY;  Surgeon: Eulas Post, MD;  Location: [facility] OR;  Service: Orthopedics;  Laterality: Right;   TOTAL SHOULDER ARTHROPLASTY Right 06/21/2015   Procedure: RIGHT TOTAL SHOULDER ARTHROPLASTY;  Surgeon: Teryl Lucy, MD;  Location: [facility] OR;  Service: Orthopedics;  Laterality: Right;   TOTAL THYROIDECTOMY  2010   HPI:  Christine Leblanc is a 89 y.o. female with medical history significant of hypertension, MGUS, COPD, seizures who presented to the emergency department from home via EMS due to altered mental status.   At bedside, patient was somnolent, though easily arousable, but was unable to provide history.  History was obtained  from EDP and son at bedside.  Per report, patient usually was able to maintain conversation at baseline.  However, since 1 week ago, she has been coughing with production of greenish sputum which has since become thicker since onset of symptoms.  Patient was still able to maintain normal conversation as of last night, but on waking up this morning, her conversation has cognitively declined. ST consulted for clinical swallowing evaluation.    Assessment / Plan / Recommendation  Clinical Impression  Pt seen for clinical swallow evaluation with pt expelling  mucus prior to any food/liquid intake.  Oral care completed with xerostomia present.  Strong cough observed with pt able to expel mucus into oral cavity during evaluation.  Pt with chronic dysphagia per chart review with MBS being completed 8/24 indicating a pharyngoesophageal dysphagia with Dysphagia 2/thin liquid diet recommended.  Deconditioning may impact swallow function.  Pt consumed puree and thin liquid with small bites/sips without overt s/s of aspiration, but pt is expelling mucus throughout assessment, so difficult to assess.  Pt attempted to masticate soft solid, but masticated bolus expelled from oral cavity prior to initiating a swallow.  Recommend initiate a Dysphagia 2(minced)/thin liquid diet with esophageal precautions/general swallowing precautions.  Discussed respiratory/swallowing reciprocity with daughter and swallow precautions required and she was in agreement and appreciative.  ST will f/u for dysphagia tx/diet tolerance/education during acute stay.  Will assess for f/u objective assessment prn.  Thank you for this consult. SLP Visit Diagnosis: Dysphagia, pharyngoesophageal phase (R13.14)    Aspiration Risk  Mild aspiration risk;Moderate aspiration risk    Diet Recommendation   Thin;Dysphagia 2 (chopped)  Medication Administration: Crushed with puree    Other  Recommendations Oral Care Recommendations: Oral care BID;Staff/trained caregiver to provide oral care    Recommendations for follow up therapy are one component of a multi-disciplinary discharge planning process, led by the attending physician.  Recommendations may be updated based on patient status, additional functional criteria and insurance authorization.  Follow up Recommendations Follow physician's recommendations for discharge plan and follow up therapies      Assistance Recommended at Discharge  TBD  Functional Status Assessment Patient has had a recent decline in their functional status and demonstrates the  ability to make significant improvements in function in a reasonable and predictable amount of time.  Frequency and Duration min 2x/week  1 week       Prognosis Prognosis for improved oropharyngeal function: Fair Barriers to Reach Goals: Severity of deficits      Swallow Study   General Date of Onset: 03/05/24 HPI: Christine Leblanc is a 89 y.o. female with medical history significant of hypertension, MGUS, COPD, seizures who presents to the emergency department from home via EMS due to altered mental status.  At bedside, patient was somnolent, though easily arousable, but was unable to provide history.  History was obtained from EDP and son at bedside.  Per report, patient usually was able to maintain conversation at baseline.  However, since 1 week ago, she has been coughing with production of greenish sputum which has since become thicker since onset of symptoms.  Patient was still able to maintain normal conversation as of last night, but on waking up this morning, her conversation has cognitively declined. ST consulted for clinical swallowing evaluation. Type of Study: Bedside Swallow Evaluation Previous Swallow Assessment: MBS 8/24 indicating need for Dysphagia 2/thin; chronic dysphagia hx with Zenker's diverticulum/GERD impacting swallow Diet Prior to this Study: NPO Temperature Spikes Noted: Yes Respiratory Status:  Nasal cannula History of Recent Intubation: No Behavior/Cognition: Cooperative;Distractible;Alert Oral Cavity Assessment: Dry Oral Care Completed by SLP: Yes Oral Cavity - Dentition: Edentulous Self-Feeding Abilities: Needs assist Patient Positioning: Upright in bed Baseline Vocal Quality: Low vocal intensity;Hoarse Volitional Cough: Strong Volitional Swallow: Able to elicit    Oral/Motor/Sensory Function Overall Oral Motor/Sensory Function: Generalized oral weakness   Ice Chips Ice chips: Not tested   Thin Liquid Thin Liquid: Impaired Presentation:  Straw;Spoon Pharyngeal  Phase Impairments: Cough - Delayed Other Comments: pt expelling mucus prior to intake, but this continued throughout session    Nectar Thick Nectar Thick Liquid: Not tested   Honey Thick Honey Thick Liquid: Not tested   Puree Puree: Impaired Presentation: Spoon Pharyngeal Phase Impairments: Throat Clearing - Delayed Other Comments: Clearing mucus prior to intake of food/liquids   Solid     Solid: Impaired Presentation: Spoon Oral Phase Impairments: Impaired mastication Oral Phase Functional Implications: Impaired mastication;Prolonged oral transit;Other (comment) (expelled from oral cavity)      Pat Marykay Mccleod,M.S., CCC-SLP 03/05/2024,1:29 PM

## 2024-03-05 NOTE — ED Notes (Signed)
 Pt cleaned up of urinary incontinence. Turns with max assist. Tolerated poorly. Daughter at bedside.

## 2024-03-05 NOTE — Progress Notes (Signed)
 PROGRESS NOTE    Christine Leblanc  ZOX:[id number] DOB: May 15, 1929 DOA: 03/04/2024 PCP: Benita Stabile, MD  Chief Complaint  Patient presents with   Code Sepsis    Hospital Course:  Christine Leblanc is 89 y.o. female with HTN, MGUS, COPD, seizures, who presented to the ED via EMS due to altered mental status.  At baseline patient is able to maintain a conversation.  Over the week prior to hospitalization she has had increased coughing, and altered mentation.  In the ED she was found to be febrile, BP 180/90, maintaining O2 sats on room air.  Workup revealed anemia, thrombocytopenia, hypokalemia, she tested positive for influenza A.  CT head was unremarkable.  Chest x-ray without acute abnormality.  She did receive ceftriaxone and azithromycin under the presumption that she had pneumonia, however chest x-ray was clear, and procalcitonin unremarkable so antibiotics were further discontinued.  Subjective: Very drowsy this AM.  Her daughter at bedside reports that she did not sleep at all the day prior to arrival but since coming to the hospital has been very drowsy.  Patient briefly denies any pain to me.  Otherwise does not participate or interact for exam   Objective: Vitals:   03/05/24 0545 03/05/24 0630 03/05/24 0645 03/05/24 0720  BP:    (!) 165/76  Pulse: 88 82 80 87  Resp: 20 20 15 16   Temp:    99.3 F (37.4 C)  TempSrc:    Axillary  SpO2: 98% 98% 99% 99%    Intake/Output Summary (Last 24 hours) at 03/05/2024 0813 Last data filed at 03/05/2024 0340 Gross per 24 hour  Intake 1273.33 ml  Output --  Net 1273.33 ml   There were no vitals filed for this visit.  Examination: General exam: Appears calm and comfortable, NAD  Respiratory system: No work of breathing, 2L [place] in place, no wheeze Cardiovascular system: S1 & S2 heard, RRR.  Gastrointestinal system: Abdomen is nondistended, soft and nontender.  Neuro: Drowsy but arousable, does not interact for exam Extremities: Symmetric,  expected ROM Skin: No rashes, lesions Psychiatry: Calm, otherwise difficult to assess  Assessment & Plan:  Principal Problem:   Influenza A Active Problems:   COPD (chronic obstructive pulmonary disease) (HCC)   Other dysphagia   Essential hypertension   Dehydration   Acquired hypothyroidism   Hypokalemia   History of TIA (transient ischemic attack)   Dysphagia   Hypoalbuminemia due to protein-calorie malnutrition (HCC)    Influenza A Acute Hypoxic Respiratory Failure - Continue symptomatic support with Mucinex, Robitussin, as needed DuoNebs - Tylenol as needed -- Wean supplemental O2 as tolerated - Incentive spirometry and flutter valve - No evidence of superimposed bacterial pneumonia on CXR, procal added. No need for antibiotics at this time  Dehydration - s/p IVF -- Cont PO hydration now  Hypokalemia - Replace as needed  Hypoalbuminemia - Continue protein supplementation  Dysphagia - Patient is on a pured diet outpatient - SLP recs Dysphasia 2, has been added  Thrombocytopenia - Likely reactive due to influenza - Will trend CBC - No occult signs of bleeding at this time  COPD - Not currently in exacerbation - Continue DuoNebs - I-S and FV as above  MGUS - Follows outpatient with oncology, currently stable  Hypertension - Does not appear to be on any antihypertensives outpatient - SBP goal under 150 - As needed hydralazine and labetalol  History of TIA - Continue aspirin and statin  Acquired hypothyroidism - Continue Synthroid  Constipation -  Refuses miralax, request fleets enema  DVT prophylaxis: Lovenox   Code Status: Full Code Family Communication:  Discussed directly with patient's daughter at bedside Disposition:  Inpatient. Still hospitalized for supplemental O2 and deconditioning, will discharge to PT has made suggestions, anticipate she made need rehab/SNF on DC   Consultants:    Procedures:    Antimicrobials:   Anti-infectives (From admission, onward)    Start     Dose/Rate Route Frequency Ordered Stop   03/04/24 1645  cefTRIAXone (ROCEPHIN) 2 g in sodium chloride 0.9 % 100 mL IVPB  Status:  Discontinued        2 g 200 mL/hr over 30 Minutes Intravenous Once 03/04/24 1633 03/04/24 1641   03/04/24 1645  azithromycin (ZITHROMAX) 500 mg in sodium chloride 0.9 % 250 mL IVPB        500 mg 250 mL/hr over 60 Minutes Intravenous  Once 03/04/24 1633 03/04/24 1859   03/04/24 1645  cefTRIAXone (ROCEPHIN) 1 g in sodium chloride 0.9 % 100 mL IVPB        1 g 200 mL/hr over 30 Minutes Intravenous Once 03/04/24 1641 03/04/24 1859       Data Reviewed: I have personally reviewed following labs and imaging studies CBC: Recent Labs  Lab 03/04/24 1630 03/05/24 0550  WBC 6.3 4.0  NEUTROABS 4.1  --   HGB 9.8* 10.1*  HCT 30.5* 31.9*  MCV 91.9 92.5  PLT 127* 112*   Basic Metabolic Panel: Recent Labs  Lab 03/04/24 1630 03/05/24 0550  NA 135 135  K 3.4* 3.6  CL 101 100  CO2 24 26  GLUCOSE 117* 100*  BUN 16 14  CREATININE 1.03* 0.95  CALCIUM 9.0 8.5*  MG  --  1.5*  PHOS  --  3.1   GFR: Estimated Creatinine Clearance: 28.5 mL/min (by C-G formula based on SCr of 0.95 mg/dL). Liver Function Tests: Recent Labs  Lab 03/04/24 1630 03/05/24 0550  AST 20 24  ALT 11 12  ALKPHOS 59 54  BILITOT 0.6 0.4  PROT 7.2 6.8  ALBUMIN 3.4* 3.1*   CBG: No results for input(s): "GLUCAP" in the last 168 hours.  Recent Results (from the past 240 hours)  Blood Culture (routine x 2)     Status: None (Preliminary result)   Collection Time: 03/04/24  4:30 PM   Specimen: BLOOD LEFT FOREARM  Result Value Ref Range Status   Specimen Description BLOOD LEFT FOREARM BOTTLES DRAWN AEROBIC ONLY  Final   Special Requests Blood Culture adequate volume  Final   Culture   Final    NO GROWTH < 24 HOURS Performed at [facility], [address] [place], [place] [address]    Report Status PENDING  Incomplete  Resp panel  by RT-PCR (RSV, Flu A&B, Covid) Anterior Nasal Swab     Status: Abnormal   Collection Time: 03/04/24  4:38 PM   Specimen: Anterior Nasal Swab  Result Value Ref Range Status   SARS Coronavirus 2 by RT PCR NEGATIVE NEGATIVE Final    Comment: (NOTE) SARS-CoV-2 target nucleic acids are NOT DETECTED.  The SARS-CoV-2 RNA is generally detectable in upper respiratory specimens during the acute phase of infection. The lowest concentration of SARS-CoV-2 viral copies this assay can detect is 138 copies/mL. A negative result does not preclude SARS-Cov-2 infection and should not be used as the sole basis for treatment or other patient management decisions. A negative result may occur with  improper specimen collection/handling, submission of specimen other than nasopharyngeal  swab, presence of viral mutation(s) within the areas targeted by this assay, and inadequate number of viral copies(<138 copies/mL). A negative result must be combined with clinical observations, patient history, and epidemiological information. The expected result is Negative.  Fact Sheet for Patients:  BloggerCourse.com  Fact Sheet for Healthcare Providers:  SeriousBroker.it  This test is no t yet approved or cleared by the [place] FDA and  has been authorized for detection and/or diagnosis of SARS-CoV-2 by FDA under an Emergency Use Authorization (EUA). This EUA will remain  in effect (meaning this test can be used) for the duration of the COVID-19 declaration under Section 564(b)(1) of the Act, 21 U.S.C.section 360bbb-3(b)(1), unless the authorization is terminated  or revoked sooner.       Influenza A by PCR POSITIVE (A) NEGATIVE Final   Influenza B by PCR NEGATIVE NEGATIVE Final    Comment: (NOTE) The Xpert Xpress SARS-CoV-2/FLU/RSV plus assay is intended as an aid in the diagnosis of influenza from Nasopharyngeal swab specimens and should not be used as a  sole basis for treatment. Nasal washings and aspirates are unacceptable for Xpert Xpress SARS-CoV-2/FLU/RSV testing.  Fact Sheet for Patients: BloggerCourse.com  Fact Sheet for Healthcare Providers: SeriousBroker.it  This test is not yet approved or cleared by the [place] FDA and has been authorized for detection and/or diagnosis of SARS-CoV-2 by FDA under an Emergency Use Authorization (EUA). This EUA will remain in effect (meaning this test can be used) for the duration of the COVID-19 declaration under Section 564(b)(1) of the Act, 21 U.S.C. section 360bbb-3(b)(1), unless the authorization is terminated or revoked.     Resp Syncytial Virus by PCR NEGATIVE NEGATIVE Final    Comment: (NOTE) Fact Sheet for Patients: BloggerCourse.com  Fact Sheet for Healthcare Providers: SeriousBroker.it  This test is not yet approved or cleared by the [place] FDA and has been authorized for detection and/or diagnosis of SARS-CoV-2 by FDA under an Emergency Use Authorization (EUA). This EUA will remain in effect (meaning this test can be used) for the duration of the COVID-19 declaration under Section 564(b)(1) of the Act, 21 U.S.C. section 360bbb-3(b)(1), unless the authorization is terminated or revoked.  Performed at [facility], [address] [place], [place] [address]   Blood Culture (routine x 2)     Status: None (Preliminary result)   Collection Time: 03/04/24  5:28 PM   Specimen: BLOOD  Result Value Ref Range Status   Specimen Description BLOOD BLOOD LEFT HAND BOTTLES DRAWN AEROBIC ONLY  Final   Special Requests   Final    Blood Culture results may not be optimal due to an inadequate volume of blood received in culture bottles   Culture   Final    NO GROWTH < 24 HOURS Performed at [facility], [address] [place], [place] [address]    Report Status PENDING   Incomplete     Radiology Studies: CT Head Wo Contrast Result Date: 03/04/2024 CLINICAL DATA:  Memory loss EXAM: CT HEAD WITHOUT CONTRAST TECHNIQUE: Contiguous axial images were obtained from the base of the skull through the vertex without intravenous contrast. RADIATION DOSE REDUCTION: This exam was performed according to the departmental dose-optimization program which includes automated exposure control, adjustment of the mA and/or kV according to patient size and/or use of iterative reconstruction technique. COMPARISON:  CT brain 12/09/2023, MRI 01/06/2024 FINDINGS: Brain: No acute territorial infarction, hemorrhage or intracranial mass. Atrophy and chronic small vessel ischemic changes of the white matter. Chronic right  ganglial capsular infarct. Stable ventricle size Vascular: No hyperdense vessels.  Carotid vascular calcification Skull: Normal. Negative for fracture or focal lesion. Sinuses/Orbits: Mucosal thickening in the sinuses Other: None IMPRESSION: No CT evidence for acute intracranial abnormality. Atrophy and chronic small vessel ischemic changes of the white matter Electronically Signed   By: Jasmine Pang M.D.   On: 03/04/2024 19:56   DG Chest Portable 1 View Result Date: 03/04/2024 CLINICAL DATA:  Productive cough. Fever. Altered mental status. Tachycardia. EXAM: PORTABLE CHEST 1 VIEW COMPARISON:  01/20/2024 FINDINGS: Normal sized heart. Tortuous and partially calcified thoracic aorta. Stable biapical pleural and parenchymal scarring interstitial fibrosis in the upper lobes. The remainder of the lungs remain clear. No pleural fluid. Bilateral shoulder prostheses. Bilateral lower neck surgical clips IMPRESSION: 1. No acute abnormality. 2. Stable biapical pleural and parenchymal scarring and interstitial fibrosis in the upper lobes. 3. Aortic atherosclerosis. Electronically Signed   By: Beckie Salts M.D.   On: 03/04/2024 18:47    Scheduled Meds:  aspirin EC  81 mg Oral Daily   atorvastatin   40 mg Oral QHS   dextromethorphan-guaiFENesin  1 tablet Oral BID   enoxaparin (LOVENOX) injection  30 mg Subcutaneous Q24H   feeding supplement  237 mL Oral BID BM   levothyroxine  88 mcg Oral See admin instructions   Continuous Infusions:  sodium chloride Stopped (03/05/24 0340)   lactated ringers Stopped (03/05/24 0220)     LOS: 1 day   Total time spent interpreting labs and vitals, coordinating care amongst consultants and care team members, directly assessing and discussing care with the patient and/or family: 45 min   Debarah Crape, DO [facility]  To contact the attending physician between 7A-7P please use Epic Chat. To contact the covering physician during after hours 7P-7A, please review Amion.   03/05/2024, 8:13 AM   *This document has been created with the assistance of dictation software. Please excuse typographical errors. *

## 2024-03-05 NOTE — TOC Initial Note (Signed)
 Transition of Care [facility]) - Initial/Assessment Note    Patient Details  Name: Christine Leblanc MRN: [id number] Date of Birth: 24-Sep-1929  Transition of Care (TOC) CM/SW Contact:    Armanda Heritage, RN Phone Number: 03/05/2024, 12:41 PM  Clinical Narrative:                 Patient admitted with Influenza A, from home, where she lives with son and daughter (special needs).  Cm spoke with son, as patient is disoriented at the time of evaluation.  Son reports at baseline patient is ambulatory with walker around the home for short distances, estimates no longer than 32ft at a time. Son provides care to patient in the home, including pcp appointments and grocery shopping.  There are no transportation needs identified.  Per son's report at baseline patient is alert and oriented.  Ideally son would like for patient to return home post hospitalization.  TOC will continue to follow for discharge planning needs.   Expected Discharge Plan: Home/Self Care Barriers to Discharge: Continued Medical Work up   Patient Goals and CMS Choice Patient states their goals for this hospitalization and ongoing recovery are:: to get better and go back home with son          Expected Discharge Plan and Services       Living arrangements for the past 2 months: Single Family Home                                      Prior Living Arrangements/Services Living arrangements for the past 2 months: Single Family Home Lives with:: Adult Children Patient language and need for interpreter reviewed:: Yes Do you feel safe going back to the place where you live?: Yes      Need for Family Participation in Patient Care: Yes (Comment) Care giver support system in place?: Yes (comment) Current home services: DME (rolling walker) Criminal Activity/Legal Involvement Pertinent to Current Situation/Hospitalization: No - Comment as needed  Activities of Daily Living   ADL Screening (condition at time of  admission) Independently performs ADLs?: No Does the patient have a NEW difficulty with bathing/dressing/toileting/self-feeding that is expected to last >3 days?: No Does the patient have a NEW difficulty with getting in/out of bed, walking, or climbing stairs that is expected to last >3 days?: No Does the patient have a NEW difficulty with communication that is expected to last >3 days?: No Is the patient deaf or have difficulty hearing?: No Does the patient have difficulty seeing, even when wearing glasses/contacts?: No Does the patient have difficulty concentrating, remembering, or making decisions?: Yes  Permission Sought/Granted                  Emotional Assessment Appearance:: Appears stated age     Orientation: : Fluctuating Orientation (Suspected and/or reported Sundowners)   Psych Involvement: No (comment)  Admission diagnosis:  Influenza A [J10.1] Encephalopathy, unspecified type [G93.40] Patient Active Problem List   Diagnosis Date Noted   Hypoalbuminemia due to protein-calorie malnutrition (HCC) 03/05/2024   Influenza A 03/04/2024   TIA (transient ischemic attack) 01/06/2024   SIRS (systemic inflammatory response syndrome) (HCC) 12/09/2023   CAP (community acquired pneumonia) 12/09/2023   Chronic cough 10/03/2023   Dysphagia 08/15/2023   Protein-calorie malnutrition, mild (HCC) 06/22/2022   Acute bronchitis 06/22/2022   Iron deficiency anemia 06/22/2022   COPD (chronic obstructive pulmonary disease) (HCC) 06/22/2022  Loss of weight 10/09/2021   Pharyngeal dysphagia 10/09/2021   Mild carotid artery disease (HCC) 05/19/2020   History of TIA (transient ischemic attack) 12/25/2018   Generalized weakness 09/30/2018   Hypokalemia    MGUS (monoclonal gammopathy of unknown significance) 10/21/2017   Primary localized osteoarthrosis of right shoulder 06/21/2015   S/P shoulder replacement 06/21/2015   Oropharyngeal dysphagia 11/29/2014   Osteoarthritis of right  knee 07/27/2014   Postsurgical hypothyroidism 04/22/2014   Acquired hypothyroidism 10/12/2013   Dehydration 08/02/2012   Syncope and collapse    Anemia, normocytic normochromic 04/26/2012   Gastroesophageal reflux disease    Essential hypertension 12/07/2011   Leg pain, bilateral 04/27/2011   Dyslipidemia 04/27/2011   Other dysphagia 06/07/2010   PCP:  Benita Stabile, MD Pharmacy:   [facility] [phone] - [place] - [address] AT [facility] OF [address] [place] [address] Phone: [phone] Fax: [phone]  [facility] - [place], [place] - [address] [place] [address] Phone: [phone] Fax: [phone]     Social Drivers of Health (SDOH) Social History: SDOH Screenings   Food Insecurity: No Food Insecurity (03/05/2024)  Housing: Low Risk  (03/05/2024)  Transportation Needs: No Transportation Needs (03/05/2024)  Utilities: Not At Risk (03/05/2024)  Social Connections: Socially Isolated (03/05/2024)  Tobacco Use: Medium Risk (02/06/2024)   SDOH Interventions: Social Connections Interventions: Intervention Not Indicated, Inpatient TOC (patient lives at home with son and daughter and has strong family support)   Readmission Risk Interventions    03/05/2024   12:39 PM 12/10/2023   11:57 AM  Readmission Risk Prevention Plan  Transportation Screening Complete Complete  HRI or Home Care Consult Complete Complete  Social Work Consult for Recovery Care Planning/Counseling Complete Complete  Palliative Care Screening Not Applicable Not Applicable  Medication Review Oceanographer) Complete Complete

## 2024-03-05 NOTE — ED Notes (Signed)
 Per Dr. Rennis Chris, pt may leave IV out at this time.

## 2024-03-06 DIAGNOSIS — I10 Essential (primary) hypertension: Secondary | ICD-10-CM | POA: Diagnosis not present

## 2024-03-06 DIAGNOSIS — E8809 Other disorders of plasma-protein metabolism, not elsewhere classified: Secondary | ICD-10-CM | POA: Diagnosis not present

## 2024-03-06 DIAGNOSIS — J439 Emphysema, unspecified: Secondary | ICD-10-CM | POA: Diagnosis not present

## 2024-03-06 DIAGNOSIS — G9340 Encephalopathy, unspecified: Secondary | ICD-10-CM | POA: Diagnosis not present

## 2024-03-06 DIAGNOSIS — E876 Hypokalemia: Secondary | ICD-10-CM | POA: Diagnosis not present

## 2024-03-06 DIAGNOSIS — E860 Dehydration: Secondary | ICD-10-CM | POA: Diagnosis not present

## 2024-03-06 DIAGNOSIS — J101 Influenza due to other identified influenza virus with other respiratory manifestations: Secondary | ICD-10-CM | POA: Diagnosis not present

## 2024-03-06 DIAGNOSIS — R1310 Dysphagia, unspecified: Secondary | ICD-10-CM | POA: Diagnosis not present

## 2024-03-06 DIAGNOSIS — R1319 Other dysphagia: Secondary | ICD-10-CM | POA: Diagnosis not present

## 2024-03-06 DIAGNOSIS — E039 Hypothyroidism, unspecified: Secondary | ICD-10-CM | POA: Diagnosis not present

## 2024-03-06 DIAGNOSIS — E46 Unspecified protein-calorie malnutrition: Secondary | ICD-10-CM | POA: Diagnosis not present

## 2024-03-06 DIAGNOSIS — Z8673 Personal history of transient ischemic attack (TIA), and cerebral infarction without residual deficits: Secondary | ICD-10-CM | POA: Diagnosis not present

## 2024-03-06 LAB — CBC WITH DIFFERENTIAL/PLATELET
Abs Immature Granulocytes: 0 10*3/uL (ref 0.00–0.07)
Band Neutrophils: 7 %
Basophils Absolute: 0 10*3/uL (ref 0.0–0.1)
Basophils Relative: 0 %
Eosinophils Absolute: 0.1 10*3/uL (ref 0.0–0.5)
Eosinophils Relative: 1 %
HCT: 36.1 % (ref 36.0–46.0)
Hemoglobin: 11.7 g/dL — ABNORMAL LOW (ref 12.0–15.0)
Lymphocytes Relative: 17 %
Lymphs Abs: 0.9 10*3/uL (ref 0.7–4.0)
MCH: 29.2 pg (ref 26.0–34.0)
MCHC: 32.4 g/dL (ref 30.0–36.0)
MCV: 90 fL (ref 80.0–100.0)
Monocytes Absolute: 1 10*3/uL (ref 0.1–1.0)
Monocytes Relative: 20 %
Neutro Abs: 3.2 10*3/uL (ref 1.7–7.7)
Neutrophils Relative %: 55 %
Platelets: 111 10*3/uL — ABNORMAL LOW (ref 150–400)
RBC: 4.01 MIL/uL (ref 3.87–5.11)
RDW: 14.2 % (ref 11.5–15.5)
Smear Review: DECREASED
WBC: 5.2 10*3/uL (ref 4.0–10.5)
nRBC: 0 % (ref 0.0–0.2)

## 2024-03-06 LAB — COMPREHENSIVE METABOLIC PANEL
ALT: 14 U/L (ref 0–44)
AST: 36 U/L (ref 15–41)
Albumin: 3.4 g/dL — ABNORMAL LOW (ref 3.5–5.0)
Alkaline Phosphatase: 52 U/L (ref 38–126)
Anion gap: 8 (ref 5–15)
BUN: 16 mg/dL (ref 8–23)
CO2: 26 mmol/L (ref 22–32)
Calcium: 8.5 mg/dL — ABNORMAL LOW (ref 8.9–10.3)
Chloride: 96 mmol/L — ABNORMAL LOW (ref 98–111)
Creatinine, Ser: 0.87 mg/dL (ref 0.44–1.00)
GFR, Estimated: >60 mL/min (ref >60)
Glucose, Bld: 91 mg/dL (ref 70–99)
Potassium: 3.5 mmol/L (ref 3.5–5.1)
Sodium: 130 mmol/L — ABNORMAL LOW (ref 135–145)
Total Bilirubin: 0.6 mg/dL (ref 0.0–1.2)
Total Protein: 7 g/dL (ref 6.5–8.1)

## 2024-03-06 LAB — PHOSPHORUS: Phosphorus: 2.9 mg/dL (ref 2.5–4.6)

## 2024-03-06 LAB — MAGNESIUM: Magnesium: 1.7 mg/dL (ref 1.7–2.4)

## 2024-03-06 MED ORDER — SENNA 8.6 MG PO TABS
2.0000 | ORAL_TABLET | Freq: Every day | ORAL | Status: DC
  Administered 2024-03-06 – 2024-03-08 (×3): 17.2 mg via ORAL
  Filled 2024-03-06 (×3): qty 2

## 2024-03-06 MED ORDER — FLUTICASONE PROPIONATE 50 MCG/ACT NA SUSP
2.0000 | Freq: Every day | NASAL | Status: DC
  Administered 2024-03-06 – 2024-03-09 (×4): 2 via NASAL
  Filled 2024-03-06: qty 16

## 2024-03-06 NOTE — TOC Initial Note (Signed)
 Transition of Care [facility]) - Initial/Assessment Note    Patient Details  Name: Christine Leblanc MRN: [id number] Date of Birth: 1929-11-10  Transition of Care [facility]) CM/SW Contact:    Anabel Halon, RN Phone Number: 03/06/2024, 11:36 AM  Clinical Narrative:     CM spoke with patient and patient's daughter Lawson Fiscal. CM informed patient's daughter that patient will be discharge home with HHPT.  [facility] has accepted and booked to provide HHPT.          Expected Discharge Plan: Home w Home Health Services Barriers to Discharge: Continued Medical Work up   Patient Goals and CMS Choice Patient states their goals for this hospitalization and ongoing recovery are:: to get better and go back home with son CMS Medicare.gov Compare Post Acute Care list provided to:: Patient Represenative (must comment) Choice offered to / list presented to : Patient Chief of Staff (Daughter))      Expected Discharge Plan and Services     Post Acute Care Choice: Home Health Living arrangements for the past 2 months: Single Family Home                           HH Arranged: PT HH Agency: [facility] Date [facility] Agency Contacted: 03/06/24 Time HH Agency Contacted: 1132 Representative spoke with at [facility] Agency: Kandee Keen  Prior Living Arrangements/Services Living arrangements for the past 2 months: Single Family Home Lives with:: Adult Children Patient language and need for interpreter reviewed:: Yes Do you feel safe going back to the place where you live?: Yes      Need for Family Participation in Patient Care: Yes (Comment) Care giver support system in place?: Yes (comment) Current home services: DME (rolling walker) Criminal Activity/Legal Involvement Pertinent to Current Situation/Hospitalization: No - Comment as needed  Activities of Daily Living   ADL Screening (condition at time of admission) Independently performs ADLs?: No Does the patient have a NEW difficulty with  bathing/dressing/toileting/self-feeding that is expected to last >3 days?: No Does the patient have a NEW difficulty with getting in/out of bed, walking, or climbing stairs that is expected to last >3 days?: No Does the patient have a NEW difficulty with communication that is expected to last >3 days?: No Is the patient deaf or have difficulty hearing?: No Does the patient have difficulty seeing, even when wearing glasses/contacts?: No Does the patient have difficulty concentrating, remembering, or making decisions?: Yes  Permission Sought/Granted                  Emotional Assessment Appearance:: Appears stated age     Orientation: : Fluctuating Orientation (Suspected and/or reported Sundowners)   Psych Involvement: No (comment)  Admission diagnosis:  Influenza A [J10.1] Encephalopathy, unspecified type [G93.40] Patient Active Problem List   Diagnosis Date Noted   Hypoalbuminemia due to protein-calorie malnutrition (HCC) 03/05/2024   Influenza A 03/04/2024   TIA (transient ischemic attack) 01/06/2024   SIRS (systemic inflammatory response syndrome) (HCC) 12/09/2023   CAP (community acquired pneumonia) 12/09/2023   Chronic cough 10/03/2023   Dysphagia 08/15/2023   Protein-calorie malnutrition, mild (HCC) 06/22/2022   Acute bronchitis 06/22/2022   Iron deficiency anemia 06/22/2022   COPD (chronic obstructive pulmonary disease) (HCC) 06/22/2022   Loss of weight 10/09/2021   Pharyngeal dysphagia 10/09/2021   Mild carotid artery disease (HCC) 05/19/2020   History of TIA (transient ischemic attack) 12/25/2018   Generalized weakness 09/30/2018   Hypokalemia  MGUS (monoclonal gammopathy of unknown significance) 10/21/2017   Primary localized osteoarthrosis of right shoulder 06/21/2015   S/P shoulder replacement 06/21/2015   Oropharyngeal dysphagia 11/29/2014   Osteoarthritis of right knee 07/27/2014   Postsurgical hypothyroidism 04/22/2014   Acquired hypothyroidism  10/12/2013   Dehydration 08/02/2012   Syncope and collapse    Anemia, normocytic normochromic 04/26/2012   Gastroesophageal reflux disease    Essential hypertension 12/07/2011   Leg pain, bilateral 04/27/2011   Dyslipidemia 04/27/2011   Other dysphagia 06/07/2010   PCP:  Benita Stabile, MD Pharmacy:   [facility] [phone] - [place], [place] - [address] AT [facility] OF [address] [place] [address] Phone: [phone] Fax: [phone]  [facility] - [place], [place] - [address] [place] [address] Phone: [phone] Fax: [phone]     Social Drivers of Health (SDOH) Social History: SDOH Screenings   Food Insecurity: No Food Insecurity (03/05/2024)  Housing: Low Risk  (03/05/2024)  Transportation Needs: No Transportation Needs (03/05/2024)  Utilities: Not At Risk (03/05/2024)  Social Connections: Socially Isolated (03/05/2024)  Tobacco Use: Medium Risk (03/05/2024)   SDOH Interventions: Social Connections Interventions: Intervention Not Indicated, Inpatient TOC (patient lives at home with son and daughter and has strong family support)   Readmission Risk Interventions    03/05/2024   12:39 PM 12/10/2023   11:57 AM  Readmission Risk Prevention Plan  Transportation Screening Complete Complete  HRI or Home Care Consult Complete Complete  Social Work Consult for Recovery Care Planning/Counseling Complete Complete  Palliative Care Screening Not Applicable Not Applicable  Medication Review Oceanographer) Complete Complete

## 2024-03-06 NOTE — Evaluation (Signed)
 Physical Therapy Evaluation Patient Details Name: Christine Leblanc MRN: [id number] DOB: [birth year] Today's Date: 03/06/2024  History of Present Illness  Christine Leblanc is a 89 y.o. female with medical history significant of hypertension, MGUS, COPD, seizures who presents to the emergency department from home via EMS due to altered mental status.  At bedside, patient was somnolent, though easily arousable, but was unable to provide history.  History was obtained from EDP and son at bedside.  Per report, patient usually was able to maintain conversation at baseline.  However, since 1 week ago, she has been coughing with production of greenish sputum which has since become thicker since onset of symptoms.  Patient was still able to maintain normal conversation as of last night, but on waking up this morning, her conversation has cognitively declined.  EMS was activated and arrival of EMS team, temperature was noted to be 101F and she was tachycardic with BP of 90/50.  1 g of Rocephin and 100 cc of NS was given en route.   Clinical Impression  Patient demonstrates slow labored movement for sitting up at bedside with difficulty following directions for scooting forward and had frequent posterior leaning. Patient limited to a few slow labored side steps with buckling of knees due to weakness and tolerated sitting up in chair with her daughter present after therapy. Patient will benefit from continued skilled physical therapy in hospital and recommended venue below to increase strength, balance, endurance for safe ADLs and gait.          If plan is discharge home, recommend the following: A lot of help with bathing/dressing/bathroom;A lot of help with walking and/or transfers;Help with stairs or ramp for entrance;Assistance with cooking/housework   Can travel by private vehicle        Equipment Recommendations None recommended by PT  Recommendations for Other Services       Functional Status Assessment  Patient has had a recent decline in their functional status and demonstrates the ability to make significant improvements in function in a reasonable and predictable amount of time.     Precautions / Restrictions Precautions Precautions: Fall Restrictions Weight Bearing Restrictions Per Provider Order: No      Mobility  Bed Mobility Overal bed mobility: Needs Assistance Bed Mobility: Supine to Sit     Supine to sit: Mod assist     General bed mobility comments: increased time, labored movement    Transfers Overall transfer level: Needs assistance Equipment used: Rolling walker (2 wheels) Transfers: Sit to/from Stand, Bed to chair/wheelchair/BSC Sit to Stand: Min assist, Mod assist   Step pivot transfers: Min assist, Mod assist       General transfer comment: unsteady labored movement    Ambulation/Gait Ambulation/Gait assistance: Mod assist Gait Distance (Feet): 5 Feet Assistive device: Rolling walker (2 wheels) Gait Pattern/deviations: Decreased step length - left, Decreased stance time - right, Decreased stride length Gait velocity: slow     General Gait Details: limited to a few side steps due to BLE weakness and fatigue  Stairs            Wheelchair Mobility     Tilt Bed    Modified Rankin (Stroke Patients Only)       Balance Overall balance assessment: Needs assistance Sitting-balance support: Feet supported, No upper extremity supported Sitting balance-Leahy Scale: Poor Sitting balance - Comments: fair/poor seated at EOB Postural control: Posterior lean Standing balance support: Reliant on assistive device for balance, During functional activity, Bilateral upper extremity  supported Standing balance-Leahy Scale: Poor Standing balance comment: fair/poor using RW                             Pertinent Vitals/Pain Pain Assessment Pain Assessment: No/denies pain    Home Living Family/patient expects to be discharged to::  Private residence Living Arrangements: Children Available Help at Discharge: Family;Available 24 hours/day Type of Home: House Home Access: Stairs to enter Entrance Stairs-Rails: None Entrance Stairs-Number of Steps: 1   Home Layout: One level;Laundry or work area in basement;Able to live on main level with bedroom/bathroom Home Equipment: Agricultural consultant (2 wheels);Cane - single point;Grab bars - tub/shower;Shower seat;BSC/3in1      Prior Function Prior Level of Function : Needs assist       Physical Assist : Mobility (physical);ADLs (physical) Mobility (physical): Bed mobility;Transfers;Gait;Stairs   Mobility Comments: household ambulation using RW ADLs Comments: Assisted by family for LB dressing, meal preparation, housekeeping     Extremity/Trunk Assessment   Upper Extremity Assessment Upper Extremity Assessment: Defer to OT evaluation    Lower Extremity Assessment Lower Extremity Assessment: Generalized weakness    Cervical / Trunk Assessment Cervical / Trunk Assessment: Kyphotic  Communication   Communication Factors Affecting Communication: Reduced clarity of speech    Cognition Arousal: Lethargic Behavior During Therapy: WFL for tasks assessed/performed   PT - Cognitive impairments: No apparent impairments                         Following commands: Impaired Following commands impaired: Follows one step commands with increased time     Cueing Cueing Techniques: Verbal cues, Tactile cues     General Comments      Exercises     Assessment/Plan    PT Assessment Patient needs continued PT services  PT Problem List Decreased strength;Decreased activity tolerance;Decreased balance;Decreased mobility       PT Treatment Interventions DME instruction;Gait training;Stair training;Functional mobility training;Therapeutic activities;Therapeutic exercise;Balance training;Patient/family education    PT Goals (Current goals can be found in the Care  Plan section)  Acute Rehab PT Goals Patient Stated Goal: return home with family to assist PT Goal Formulation: With patient/family Time For Goal Achievement: 03/13/24 Potential to Achieve Goals: Good    Frequency Min 3X/week     Co-evaluation PT/OT/SLP Co-Evaluation/Treatment: Yes Reason for Co-Treatment: To address functional/ADL transfers PT goals addressed during session: Mobility/safety with mobility;Balance;Proper use of DME         AM-PAC PT "6 Clicks" Mobility  Outcome Measure Help needed turning from your back to your side while in a flat bed without using bedrails?: A Lot Help needed moving from lying on your back to sitting on the side of a flat bed without using bedrails?: A Lot Help needed moving to and from a bed to a chair (including a wheelchair)?: A Lot Help needed standing up from a chair using your arms (e.g., wheelchair or bedside chair)?: A Lot Help needed to walk in hospital room?: A Lot Help needed climbing 3-5 steps with a railing? : A Lot 6 Click Score: 12    End of Session   Activity Tolerance: Patient tolerated treatment well;Patient limited by fatigue Patient left: in chair Nurse Communication: Mobility status PT Visit Diagnosis: Unsteadiness on feet (R26.81);Other abnormalities of gait and mobility (R26.89);Muscle weakness (generalized) (M62.81)    Time: 4696-2952 PT Time Calculation (min) (ACUTE ONLY): 27 min   Charges:   PT Evaluation $PT  Eval Moderate Complexity: 1 Mod PT Treatments $Therapeutic Activity: 23-37 mins PT General Charges $$ ACUTE PT VISIT: 1 Visit         12:20 PM, 03/06/24 Ocie Bob, MPT Physical Therapist with [facility] 336 [phone] office [phone] mobile phone

## 2024-03-06 NOTE — Progress Notes (Addendum)
 PROGRESS NOTE    Christine Leblanc  NWG:[id number] DOB: Oct 24, 1929 DOA: 03/04/2024 PCP: Benita Stabile, MD  Chief Complaint  Patient presents with   Code Sepsis    Hospital Course:  LATERRA Leblanc is 89 y.o. female with HTN, MGUS, COPD, seizures, who presented to the ED via EMS due to altered mental status.  At baseline patient is able to maintain a conversation.  Over the week prior to hospitalization she has had increased coughing, and altered mentation.  In the ED she was found to be febrile, BP 180/90, maintaining O2 sats on room air.  Workup revealed anemia, thrombocytopenia, hypokalemia, she tested positive for influenza A.  CT head was unremarkable.  Chest x-ray without acute abnormality.  She did receive ceftriaxone and azithromycin under the presumption that she had pneumonia, however chest x-ray was clear, and procalcitonin unremarkable so antibiotics were further discontinued.  Subjective: No fever overnight, patient remains very drowsy but she is arousable.  She is interested in eating breakfast this morning.   Objective: Vitals:   03/05/24 1250 03/05/24 1712 03/05/24 1958 03/06/24 0512  BP: (!) 177/86 (!) 150/73 (!) 150/89 (!) 156/100  Pulse: 83 82 74 89  Resp: 16 16 18 16   Temp: 99.7 F (37.6 C) 98.8 F (37.1 C) 97.8 F (36.6 C) 98.6 F (37 C)  TempSrc: Oral Oral Oral Oral  SpO2: 97% 99% 98% 96%    Intake/Output Summary (Last 24 hours) at 03/06/2024 0847 Last data filed at 03/05/2024 1644 Gross per 24 hour  Intake --  Output 600 ml  Net -600 ml   There were no vitals filed for this visit.  Examination: General exam: Appears calm and comfortable, NAD  Respiratory system: No work of breathing, 2L [place] in place, no wheeze Cardiovascular system: S1 & S2 heard, RRR.  Gastrointestinal system: Abdomen is nondistended, soft and nontender.  Neuro: Drowsy but arousable, opens eyes to sternal rub and endorses desire to eat breakfast. Extremities: Symmetric, expected ROM Skin:  No rashes, lesions Psychiatry: Calm, otherwise difficult to assess  Assessment & Plan:  Principal Problem:   Influenza A Active Problems:   COPD (chronic obstructive pulmonary disease) (HCC)   Other dysphagia   Essential hypertension   Dehydration   Acquired hypothyroidism   Hypokalemia   History of TIA (transient ischemic attack)   Dysphagia   Hypoalbuminemia due to protein-calorie malnutrition (HCC)    Influenza A Acute hypoxic respiratory failure has been ruled out, hypoxemia only  - Suspect her supplemental O2 needs at this time are due to poor respiratory effort from generalized fatigue/deconditioning.  No evidence of pneumonia or COPD exacerbation.  - Continue symptomatic support with Mucinex, Robitussin, as needed DuoNebs - Tylenol as needed -- Wean supplemental O2 as tolerated.  - Incentive spirometry and flutter valve - No evidence of superimposed bacterial pneumonia on CXR, procal neg. No need for antibiotics at this time - No Tamiflu given onset of illness over 5 days prior to arrival  Acute metabolic encephalopathy - Suspect secondary to influenza - Patient remains very drowsy though arousable.  This appears far from her baseline - Her daughter reports she did not sleep at all for 24 hours prior to arrival which may be contributing. - Continue frequent reorientation - Delirium precautions - Head CT on arrival without acute changes, consistent with atrophy and small vessel ischemic changes.  Labs within normal limits. - Urine culture growing Staph epidermidis, suspect that this is contaminant.  No leuks or nitrites on UA.  Patient denies dysuria.suprapubic pain.  Dehydration - s/p IVF -- Cont PO hydration now  Hypokalemia - Replace as needed  Hypoalbuminemia - Continue protein supplementation  Dysphagia - Patient is on a pured diet outpatient - SLP recs Dysphasia 2, has been added  Thrombocytopenia - Likely reactive due to influenza, further  complicated by MGUS as above - Will trend CBC - No occult signs of bleeding at this time  COPD - Not currently in exacerbation - Continue DuoNebs - I-S and FV as above  MGUS - Follows outpatient with oncology, currently stable  Hypertension - Does not appear to be on any antihypertensives outpatient - SBP goal under 150 - As needed hydralazine and labetalol  History of TIA - Continue aspirin and statin (pt no longer takes plavix)  Acquired hypothyroidism - Continue Synthroid  Constipation - Refuses miralax, request fleets enema  Hyponatremia -Received IV fluids yesterday.  Will continue to monitor.  CKD stage IIIb - Creatinine baseline around 1.0, currently improved beyond that. - Renally dose with a creatinine clearance of 31 when needed - Avoid nephrotoxic meds  Normocytic anemia, chronic - Secondary to CKD and iron deficiency per workup with hematology outpatient - Trend hemoglobin. Currently stable.  DVT prophylaxis: Lovenox   Code Status: Full Code Family Communication:  Attempted to reach daughter Rosie Fate, no answer. Voicemail left. Disposition:  Inpatient. Still hospitalized for supplemental O2 and deconditioning, will discharge to [facility] vs SNF when PT has made suggestions, anticipate she made need rehab/SNF on DC   *Addendum: Spoke with Daughter, Lawson Fiscal on the phone. Care plan discussed,   Consultants:    Procedures:    Antimicrobials:  Anti-infectives (From admission, onward)    Start     Dose/Rate Route Frequency Ordered Stop   03/04/24 1645  cefTRIAXone (ROCEPHIN) 2 g in sodium chloride 0.9 % 100 mL IVPB  Status:  Discontinued        2 g 200 mL/hr over 30 Minutes Intravenous Once 03/04/24 1633 03/04/24 1641   03/04/24 1645  azithromycin (ZITHROMAX) 500 mg in sodium chloride 0.9 % 250 mL IVPB        500 mg 250 mL/hr over 60 Minutes Intravenous  Once 03/04/24 1633 03/04/24 1859   03/04/24 1645  cefTRIAXone (ROCEPHIN) 1 g in sodium chloride 0.9 %  100 mL IVPB        1 g 200 mL/hr over 30 Minutes Intravenous Once 03/04/24 1641 03/04/24 1859       Data Reviewed: I have personally reviewed following labs and imaging studies CBC: Recent Labs  Lab 03/04/24 1630 03/05/24 0550 03/06/24 0321  WBC 6.3 4.0 5.2  NEUTROABS 4.1  --  3.2  HGB 9.8* 10.1* 11.7*  HCT 30.5* 31.9* 36.1  MCV 91.9 92.5 90.0  PLT 127* 112* 111*   Basic Metabolic Panel: Recent Labs  Lab 03/04/24 1630 03/05/24 0550 03/06/24 0321  NA 135 135 130*  K 3.4* 3.6 3.5  CL 101 100 96*  CO2 24 26 26   GLUCOSE 117* 100* 91  BUN 16 14 16   CREATININE 1.03* 0.95 0.87  CALCIUM 9.0 8.5* 8.5*  MG  --  1.5* 1.7  PHOS  --  3.1 2.9   GFR: Estimated Creatinine Clearance: 31.1 mL/min (by C-G formula based on SCr of 0.87 mg/dL). Liver Function Tests: Recent Labs  Lab 03/04/24 1630 03/05/24 0550 03/06/24 0321  AST 20 24 36  ALT 11 12 14   ALKPHOS 59 54 52  BILITOT 0.6 0.4 0.6  PROT 7.2  6.8 7.0  ALBUMIN 3.4* 3.1* 3.4*   CBG: No results for input(s): "GLUCAP" in the last 168 hours.  Recent Results (from the past 240 hours)  Blood Culture (routine x 2)     Status: None (Preliminary result)   Collection Time: 03/04/24  4:30 PM   Specimen: BLOOD LEFT FOREARM  Result Value Ref Range Status   Specimen Description BLOOD LEFT FOREARM BOTTLES DRAWN AEROBIC ONLY  Final   Special Requests Blood Culture adequate volume  Final   Culture   Final    NO GROWTH 2 DAYS Performed at [facility] [address] [place], [place] [address]    Report Status PENDING  Incomplete  Resp panel by RT-PCR (RSV, Flu A&B, Covid) Anterior Nasal Swab     Status: Abnormal   Collection Time: 03/04/24  4:38 PM   Specimen: Anterior Nasal Swab  Result Value Ref Range Status   SARS Coronavirus 2 by RT PCR NEGATIVE NEGATIVE Final    Comment: (NOTE) SARS-CoV-2 target nucleic acids are NOT DETECTED.  The SARS-CoV-2 RNA is generally detectable in upper respiratory specimens during the acute  phase of infection. The lowest concentration of SARS-CoV-2 viral copies this assay can detect is 138 copies/mL. A negative result does not preclude SARS-Cov-2 infection and should not be used as the sole basis for treatment or other patient management decisions. A negative result may occur with  improper specimen collection/handling, submission of specimen other than nasopharyngeal swab, presence of viral mutation(s) within the areas targeted by this assay, and inadequate number of viral copies(<138 copies/mL). A negative result must be combined with clinical observations, patient history, and epidemiological information. The expected result is Negative.  Fact Sheet for Patients:  BloggerCourse.com  Fact Sheet for Healthcare Providers:  SeriousBroker.it  This test is no t yet approved or cleared by the [place] FDA and  has been authorized for detection and/or diagnosis of SARS-CoV-2 by FDA under an Emergency Use Authorization (EUA). This EUA will remain  in effect (meaning this test can be used) for the duration of the COVID-19 declaration under Section 564(b)(1) of the Act, 21 U.S.C.section 360bbb-3(b)(1), unless the authorization is terminated  or revoked sooner.       Influenza A by PCR POSITIVE (A) NEGATIVE Final   Influenza B by PCR NEGATIVE NEGATIVE Final    Comment: (NOTE) The Xpert Xpress SARS-CoV-2/FLU/RSV plus assay is intended as an aid in the diagnosis of influenza from Nasopharyngeal swab specimens and should not be used as a sole basis for treatment. Nasal washings and aspirates are unacceptable for Xpert Xpress SARS-CoV-2/FLU/RSV testing.  Fact Sheet for Patients: BloggerCourse.com  Fact Sheet for Healthcare Providers: SeriousBroker.it  This test is not yet approved or cleared by the [place] FDA and has been authorized for detection and/or diagnosis  of SARS-CoV-2 by FDA under an Emergency Use Authorization (EUA). This EUA will remain in effect (meaning this test can be used) for the duration of the COVID-19 declaration under Section 564(b)(1) of the Act, 21 U.S.C. section 360bbb-3(b)(1), unless the authorization is terminated or revoked.     Resp Syncytial Virus by PCR NEGATIVE NEGATIVE Final    Comment: (NOTE) Fact Sheet for Patients: BloggerCourse.com  Fact Sheet for Healthcare Providers: SeriousBroker.it  This test is not yet approved or cleared by the [place] FDA and has been authorized for detection and/or diagnosis of SARS-CoV-2 by FDA under an Emergency Use Authorization (EUA). This EUA will remain in effect (meaning this test can be used)  for the duration of the COVID-19 declaration under Section 564(b)(1) of the Act, 21 U.S.C. section 360bbb-3(b)(1), unless the authorization is terminated or revoked.  Performed at [facility], [address] [place], [place] [address]   Blood Culture (routine x 2)     Status: None (Preliminary result)   Collection Time: 03/04/24  5:28 PM   Specimen: BLOOD  Result Value Ref Range Status   Specimen Description BLOOD BLOOD LEFT HAND BOTTLES DRAWN AEROBIC ONLY  Final   Special Requests   Final    Blood Culture results may not be optimal due to an inadequate volume of blood received in culture bottles   Culture   Final    NO GROWTH 2 DAYS Performed at [facility], [address], [place], [place] [address]    Report Status PENDING  Incomplete     Radiology Studies: CT Head Wo Contrast Result Date: 03/04/2024 CLINICAL DATA:  Memory loss EXAM: CT HEAD WITHOUT CONTRAST TECHNIQUE: Contiguous axial images were obtained from the base of the skull through the vertex without intravenous contrast. RADIATION DOSE REDUCTION: This exam was performed according to the departmental dose-optimization program which includes automated exposure  control, adjustment of the mA and/or kV according to patient size and/or use of iterative reconstruction technique. COMPARISON:  CT brain 12/09/2023, MRI 01/06/2024 FINDINGS: Brain: No acute territorial infarction, hemorrhage or intracranial mass. Atrophy and chronic small vessel ischemic changes of the white matter. Chronic right ganglial capsular infarct. Stable ventricle size Vascular: No hyperdense vessels.  Carotid vascular calcification Skull: Normal. Negative for fracture or focal lesion. Sinuses/Orbits: Mucosal thickening in the sinuses Other: None IMPRESSION: No CT evidence for acute intracranial abnormality. Atrophy and chronic small vessel ischemic changes of the white matter Electronically Signed   By: Jasmine Pang M.D.   On: 03/04/2024 19:56   DG Chest Portable 1 View Result Date: 03/04/2024 CLINICAL DATA:  Productive cough. Fever. Altered mental status. Tachycardia. EXAM: PORTABLE CHEST 1 VIEW COMPARISON:  01/20/2024 FINDINGS: Normal sized heart. Tortuous and partially calcified thoracic aorta. Stable biapical pleural and parenchymal scarring interstitial fibrosis in the upper lobes. The remainder of the lungs remain clear. No pleural fluid. Bilateral shoulder prostheses. Bilateral lower neck surgical clips IMPRESSION: 1. No acute abnormality. 2. Stable biapical pleural and parenchymal scarring and interstitial fibrosis in the upper lobes. 3. Aortic atherosclerosis. Electronically Signed   By: Beckie Salts M.D.   On: 03/04/2024 18:47    Scheduled Meds:  aspirin EC  81 mg Oral Daily   atorvastatin  40 mg Oral QHS   dextromethorphan-guaiFENesin  1 tablet Oral BID   enoxaparin (LOVENOX) injection  30 mg Subcutaneous Q24H   levothyroxine  88 mcg Oral Q0600   Continuous Infusions:     LOS: 2 days   Total time spent interpreting labs and vitals, coordinating care amongst consultants and care team members, directly assessing and discussing care with the patient and/or family: 45  min   Debarah Crape, DO [facility]  To contact the attending physician between 7A-7P please use Epic Chat. To contact the covering physician during after hours 7P-7A, please review Amion.   03/06/2024, 8:47 AM   *This document has been created with the assistance of dictation software. Please excuse typographical errors. *

## 2024-03-06 NOTE — Progress Notes (Signed)
 03/06/24 0800  Charting Type  Charting Type Shift Assessment  Neurological  Neuro (WDL) X  Orientation Level Oriented to person  Cognition Follows commands  Speech Clear  Neuro Symptoms Forgetful;Drowsiness;Other (Comment) (confused)  Neuro Additional Assessments NIH stroke scale  NIH Stroke Scale   Dizziness Present No  Headache Present No  Interval Neuro change  Level of Consciousness (1a.)    0  LOC Questions (1b. )    1  LOC Commands (1c. )    1  Best Gaze (2. )   0  Visual (3. )   0  Facial Palsy (4. )     0  Motor Arm, Left (5a. )    0  Motor Arm, Right (5b. )  0  Motor Leg, Left (6a. )   0  Motor Leg, Right (6b. )  2  Limb Ataxia (7. ) 0  Sensory (8. )   0  Best Language (9. )   0  Dysarthria (10. ) 0  Extinction/Inattention (11.)    1  Complete NIHSS TOTAL 5  NuDESC - Delirium Risk Factor Assessment (Complete for non-ICU patients)  Delirium Risk Factor Assessment Age greater than or equal to 65 years  NuDESC - Nursing Delirium Screening Scale (Complete for non-ICU patients)  Disorientation 1  Inappropriate Behavior 0  Inappropriate Communications 0  Illusions/hallucinations 0  Psychomotor Retardation 0  NuDESC Total Score 1  NuDESC - Delirium Prevention:  Universal Requirements (Complete for all non-ICU patients with a delirium risk factor)  Universal Precautions Initiated *See Row Information* Yes  Oral Assessment (Complete on admission/transfer/every shift)  Oral Assessment  (WDL) X  Lips Symmetrical  Teeth Missing (Comment)  Tongue Pink;Moist  Mucous Membrane(s) Pink;Moist  Saliva Moist, saliva free flowing  Is patient on any of following O2 devices? None of the above  Nutritional status No high risk factors  Oral Assessment Risk  Low Risk  HEENT  HEENT (WDL) X  Vision Check No  R Eye Impaired vision  L Eye Impaired vision  R Ear Impaired hearing  L Ear Impaired hearing  Voice Clear  Respiratory  Respiratory Pattern Regular;Unlabored  Chest  Assessment Chest expansion symmetrical  Bilateral Breath Sounds Diminished  Respiratory (WDL) X  Cardiac  Cardiac (WDL) WDL  Pulse Regular  Vascular  Vascular (WDL) WDL  Capillary Refill Less than 3 seconds  Pulses R radial;L radial  Musculoskeletal  Musculoskeletal (WDL) X  Assistive Device None  Generalized Weakness Yes  Weight Bearing Restrictions Per Provider Order No  Gastrointestinal  Gastrointestinal (WDL) WDL  Abdomen Inspection Soft  Bowel Sounds Assessment Active  Tenderness Nontender  GU Assessment  Genitourinary (WDL) X  Genitourinary Symptoms Existing external catheter  External Urinary Catheter  Placement Date/Time: 03/05/24 1300   Person Placing LDA: AKASGA MCNEBB - NT+3  External Urinary Catheter Type: Female External Catheter with Suction  Dedicated Suction Verified suction is between 40-80 mmHg  Securement Method None needed  Site Assessment Clean, Dry, Intact  Intervention No interventions needed at this time  Psychosocial  Psychosocial (WDL) WDL  Integumentary  Integumentary (WDL) X  Skin Color Appropriate for ethnicity  Skin Condition Dry  Skin Integrity Scratch Marks  Scratch Marks Location Leg  Scratch Marks Location Orientation Bilateral  Skin Turgor Non-tenting  Mobility Assessment: RN to perform for Med/Surg, Progressive Care, Stepdown, & Geropsych at admission/transfer/every shift/prn  Does patient have an order for bedrest or is patient medically unstable No - Continue assessment  What is the highest  level of mobility based on the progressive mobility assessment? Level 2 (Chairfast) - Balance while sitting on edge of bed and cannot stand  Braden Scale (Ages 8 and up)  Sensory Perceptions 2  Moisture 3  Activity 2  Mobility 2  Nutrition 2  Friction and Shear 2  Braden Scale Score 13  Neurological  Level of Consciousness Alert   This nurse went in the room this morning to administer medicines. The patient was tired and did not want to  wake up enough to take medications. The son was at bedside and states to let patient rest for now.  Around 1030 family told the tech they would like to see the nurse. This nurse went into the patient's room and the daughter states patient was not acting like herself. This nurse did an assessment of neuro status. Patient only knew her name and birthday. Patient also had some weakness in her legs. MD Dezii made aware. MD Dezii states that CT on arrival was negative and to continue to monitor patient for now. Family made aware.

## 2024-03-06 NOTE — Plan of Care (Signed)
  Problem: Acute Rehab PT Goals(only PT should resolve) Goal: Pt Will Go Supine/Side To Sit Outcome: Progressing Flowsheets (Taken 03/06/2024 1221) Pt will go Supine/Side to Sit:  with contact guard assist  with minimal assist Goal: Patient Will Transfer Sit To/From Stand Outcome: Progressing Flowsheets (Taken 03/06/2024 1221) Patient will transfer sit to/from stand:  with contact guard assist  with minimal assist Goal: Pt Will Transfer Bed To Chair/Chair To Bed Outcome: Progressing Flowsheets (Taken 03/06/2024 1221) Pt will Transfer Bed to Chair/Chair to Bed: with min assist Goal: Pt Will Ambulate Outcome: Progressing Flowsheets (Taken 03/06/2024 1221) Pt will Ambulate:  25 feet  with minimal assist  with rolling walker   12:22 PM, 03/06/24 Ocie Bob, MPT Physical Therapist with [facility] [phone] office [phone] mobile phone

## 2024-03-06 NOTE — Care Management Important Message (Signed)
 Important Message  Patient Details  Name: Christine Leblanc MRN: [id number] Date of Birth: 25-Mar-1929   Important Message Given:  Yes - Medicare IM     Corey Harold 03/06/2024, 12:13 PM

## 2024-03-06 NOTE — Progress Notes (Signed)
 [facility] Liaison Note: This patient is currently enrolled in [facility] outpatient-based palliative care.  Please call for any outpatient based palliative care related questions or concerns. Thank you, Henderson Newcomer, LPN [facility] Liaison [phone]

## 2024-03-06 NOTE — Plan of Care (Signed)
  Problem: Acute Rehab OT Goals (only OT should resolve) Goal: Pt. Will Perform Grooming Flowsheets (Taken 03/06/2024 1555) Pt Will Perform Grooming:  with set-up  sitting Goal: Pt. Will Perform Upper Body Bathing Flowsheets (Taken 03/06/2024 1555) Pt Will Perform Upper Body Bathing:  sitting  with set-up Goal: Pt. Will Perform Lower Body Dressing Flowsheets (Taken 03/06/2024 1555) Pt Will Perform Lower Body Dressing:  with min assist  sitting/lateral leans Goal: Pt. Will Transfer To Toilet Flowsheets (Taken 03/06/2024 1555) Pt Will Transfer to Toilet: with min assist  Lurena Joiner, OTR/L

## 2024-03-06 NOTE — Evaluation (Signed)
 Occupational Therapy Evaluation Patient Details Name: Christine Leblanc MRN: [id number] DOB: [birth year] Today's Date: 03/06/2024   History of Present Illness   Christine Leblanc is a 89 y.o. female with medical history significant of hypertension, MGUS, COPD, seizures who presents to the emergency department from home via EMS due to altered mental status.  At bedside, patient was somnolent, though easily arousable, but was unable to provide history.  History was obtained from EDP and son at bedside.  Per report, patient usually was able to maintain conversation at baseline.  However, since 1 week ago, she has been coughing with production of greenish sputum which has since become thicker since onset of symptoms.  Patient was still able to maintain normal conversation as of last night, but on waking up this morning, her conversation has cognitively declined.  EMS was activated and arrival of EMS team, temperature was noted to be 101F and she was tachycardic with BP of 90/50.  1 g of Rocephin and 100 cc of NS was given en route.     Clinical Impressions Pt asleep upon OT arrival, daughter at bedside. Daughter reports that she lives with pt as well as 2 other siblings that are available for 24/7 care. Pt has history of cognitive impairments. Pt was able to complete bed mobility with mod assist as well as sit to stand t/f with mod assist. Pt doffed gown with mod assist and required mod assist to don new gown. She washed face with set up and repeated VC. Pt would benefit from continued skilled OT while in acute care settings.      If plan is discharge home, recommend the following:   A lot of help with walking and/or transfers;A lot of help with bathing/dressing/bathroom;Assistance with cooking/housework;Assistance with feeding;Direct supervision/assist for medications management;Assist for transportation;Direct supervision/assist for financial management;Help with stairs or ramp for entrance      Functional Status Assessment   Patient has had a recent decline in their functional status and demonstrates the ability to make significant improvements in function in a reasonable and predictable amount of time.     Equipment Recommendations   None recommended by OT     Recommendations for Other Services         Precautions/Restrictions   Precautions Precautions: Fall Restrictions Weight Bearing Restrictions Per Provider Order: No     Mobility Bed Mobility Overal bed mobility: Needs Assistance Bed Mobility: Supine to Sit     Supine to sit: Mod assist     General bed mobility comments: increased time, labored movement    Transfers Overall transfer level: Needs assistance Equipment used: Rolling walker (2 wheels) Transfers: Sit to/from Stand, Bed to chair/wheelchair/BSC Sit to Stand: Min assist, Mod assist     Step pivot transfers: Min assist, Mod assist     General transfer comment: unsteady labored movement      Balance Overall balance assessment: Needs assistance Sitting-balance support: Feet supported, No upper extremity supported Sitting balance-Leahy Scale: Poor Sitting balance - Comments: fair/poor seated at EOB Postural control: Posterior lean Standing balance support: Reliant on assistive device for balance, During functional activity, Bilateral upper extremity supported Standing balance-Leahy Scale: Poor Standing balance comment: fair/poor using RW                           ADL either performed or assessed with clinical judgement   ADL Overall ADL's : Needs assistance/impaired Eating/Feeding: Set up;Sitting   Grooming: Set up;Sitting Grooming  Details (indicate cue type and reason): washing face with set up Upper Body Bathing: Moderate assistance;Sitting   Lower Body Bathing: Maximal assistance;Sitting/lateral leans   Upper Body Dressing : Moderate assistance;Sitting   Lower Body Dressing: Maximal  assistance;Sitting/lateral leans   Toilet Transfer: Moderate assistance;Rolling walker (2 wheels)   Toileting- Clothing Manipulation and Hygiene: Maximal assistance;Sitting/lateral lean   Tub/ Shower Transfer: Moderate assistance;Rolling walker (2 wheels)   Functional mobility during ADLs: Rolling walker (2 wheels);Moderate assistance       Vision Baseline Vision/History: 1 Wears glasses Ability to See in Adequate Light: 0 Adequate       Perception Perception: Not tested       Praxis Praxis: Not tested       Pertinent Vitals/Pain Pain Assessment Pain Assessment: No/denies pain     Extremity/Trunk Assessment Upper Extremity Assessment Upper Extremity Assessment: Generalized weakness   Lower Extremity Assessment Lower Extremity Assessment: Defer to PT evaluation   Cervical / Trunk Assessment Cervical / Trunk Assessment: Kyphotic   Communication Communication Communication: No apparent difficulties Factors Affecting Communication: Reduced clarity of speech   Cognition Arousal: Lethargic Behavior During Therapy: WFL for tasks assessed/performed Cognition: History of cognitive impairments             OT - Cognition Comments: attempting to pull out pure whick and O2 monitor                 Following commands: Impaired Following commands impaired: Follows one step commands with increased time     Cueing  General Comments   Cueing Techniques: Verbal cues;Tactile cues              Home Living Family/patient expects to be discharged to:: Private residence Living Arrangements: Children Available Help at Discharge: Family;Available 24 hours/day Type of Home: House Home Access: Stairs to enter [employer] of Steps: 1 Entrance Stairs-Rails: None Home Layout: One level;Laundry or work area in basement;Able to live on main level with bedroom/bathroom     Bathroom Shower/Tub: Chief Strategy Officer: Standard Bathroom  Accessibility: Yes How Accessible: Accessible via walker Home Equipment: Agricultural consultant (2 wheels);Cane - single point;Grab bars - tub/shower;Shower seat;BSC/3in1          Prior Functioning/Environment Prior Level of Function : Needs assist       Physical Assist : Mobility (physical);ADLs (physical) Mobility (physical): Bed mobility;Transfers;Gait;Stairs ADLs (physical): Bathing Mobility Comments: household ambulation using RW ADLs Comments: assisted by family for bathing and IADLs    OT Problem List: Decreased strength;Decreased activity tolerance;Impaired balance (sitting and/or standing);Decreased cognition;Decreased safety awareness   OT Treatment/Interventions: Self-care/ADL training;Therapeutic exercise;DME and/or AE instruction;Therapeutic activities;Energy conservation;Patient/family education      OT Goals(Current goals can be found in the care plan section)   Acute Rehab OT Goals Patient Stated Goal: to return home OT Goal Formulation: With family Time For Goal Achievement: 03/20/24 Potential to Achieve Goals: Fair ADL Goals Pt Will Perform Grooming: with set-up;sitting Pt Will Perform Upper Body Bathing: sitting;with set-up Pt Will Perform Lower Body Dressing: with min assist;sitting/lateral leans Pt Will Transfer to Toilet: with min assist   OT Frequency:  Min 2X/week    Co-evaluation PT/OT/SLP Co-Evaluation/Treatment: Yes Reason for Co-Treatment: To address functional/ADL transfers PT goals addressed during session: Mobility/safety with mobility;Balance;Proper use of DME OT goals addressed during session: ADL's and self-care      AM-PAC OT "6 Clicks" Daily Activity     Outcome Measure Help from another person eating meals?: A Little Help from  another person taking care of personal grooming?: A Little Help from another person toileting, which includes using toliet, bedpan, or urinal?: A Lot Help from another person bathing (including washing, rinsing,  drying)?: A Lot Help from another person to put on and taking off regular upper body clothing?: A Lot Help from another person to put on and taking off regular lower body clothing?: A Lot 6 Click Score: 14   End of Session Nurse Communication: Mobility status  Activity Tolerance: Patient tolerated treatment well Patient left: in chair;with call bell/phone within reach;with chair alarm set;with family/visitor present  OT Visit Diagnosis: Muscle weakness (generalized) (M62.81);Unsteadiness on feet (R26.81)                Time: 0102-7253 OT Time Calculation (min): 17 min Charges:  OT General Charges $OT Visit: 1 Visit OT Evaluation $OT Eval Moderate Complexity: 1 Mod   Bevelyn Ngo, OTR/L 03/06/2024, 3:57 PM

## 2024-03-07 DIAGNOSIS — R1310 Dysphagia, unspecified: Secondary | ICD-10-CM | POA: Diagnosis not present

## 2024-03-07 DIAGNOSIS — E46 Unspecified protein-calorie malnutrition: Secondary | ICD-10-CM | POA: Diagnosis not present

## 2024-03-07 DIAGNOSIS — Z8673 Personal history of transient ischemic attack (TIA), and cerebral infarction without residual deficits: Secondary | ICD-10-CM | POA: Diagnosis not present

## 2024-03-07 DIAGNOSIS — E860 Dehydration: Secondary | ICD-10-CM | POA: Diagnosis not present

## 2024-03-07 DIAGNOSIS — G9340 Encephalopathy, unspecified: Secondary | ICD-10-CM | POA: Diagnosis not present

## 2024-03-07 DIAGNOSIS — E8809 Other disorders of plasma-protein metabolism, not elsewhere classified: Secondary | ICD-10-CM | POA: Diagnosis not present

## 2024-03-07 DIAGNOSIS — E876 Hypokalemia: Secondary | ICD-10-CM | POA: Diagnosis not present

## 2024-03-07 DIAGNOSIS — R1319 Other dysphagia: Secondary | ICD-10-CM | POA: Diagnosis not present

## 2024-03-07 DIAGNOSIS — I10 Essential (primary) hypertension: Secondary | ICD-10-CM | POA: Diagnosis not present

## 2024-03-07 DIAGNOSIS — E039 Hypothyroidism, unspecified: Secondary | ICD-10-CM | POA: Diagnosis not present

## 2024-03-07 DIAGNOSIS — J439 Emphysema, unspecified: Secondary | ICD-10-CM | POA: Diagnosis not present

## 2024-03-07 DIAGNOSIS — J101 Influenza due to other identified influenza virus with other respiratory manifestations: Secondary | ICD-10-CM | POA: Diagnosis not present

## 2024-03-07 LAB — CBC WITH DIFFERENTIAL/PLATELET
Abs Immature Granulocytes: 0.06 10*3/uL (ref 0.00–0.07)
Basophils Absolute: 0 10*3/uL (ref 0.0–0.1)
Basophils Relative: 0 %
Eosinophils Absolute: 0 10*3/uL (ref 0.0–0.5)
Eosinophils Relative: 0 %
HCT: 32.1 % — ABNORMAL LOW (ref 36.0–46.0)
Hemoglobin: 10.7 g/dL — ABNORMAL LOW (ref 12.0–15.0)
Immature Granulocytes: 1 %
Lymphocytes Relative: 12 %
Lymphs Abs: 1.3 10*3/uL (ref 0.7–4.0)
MCH: 29 pg (ref 26.0–34.0)
MCHC: 33.3 g/dL (ref 30.0–36.0)
MCV: 87 fL (ref 80.0–100.0)
Monocytes Absolute: 1.9 10*3/uL — ABNORMAL HIGH (ref 0.1–1.0)
Monocytes Relative: 18 %
Neutro Abs: 7.1 10*3/uL (ref 1.7–7.7)
Neutrophils Relative %: 69 %
Platelets: 116 10*3/uL — ABNORMAL LOW (ref 150–400)
RBC: 3.69 MIL/uL — ABNORMAL LOW (ref 3.87–5.11)
RDW: 14.5 % (ref 11.5–15.5)
WBC: 10.3 10*3/uL (ref 4.0–10.5)
nRBC: 0 % (ref 0.0–0.2)

## 2024-03-07 LAB — COMPREHENSIVE METABOLIC PANEL
ALT: 17 U/L (ref 0–44)
AST: 39 U/L (ref 15–41)
Albumin: 3 g/dL — ABNORMAL LOW (ref 3.5–5.0)
Alkaline Phosphatase: 49 U/L (ref 38–126)
Anion gap: 5 (ref 5–15)
BUN: 25 mg/dL — ABNORMAL HIGH (ref 8–23)
CO2: 25 mmol/L (ref 22–32)
Calcium: 8 mg/dL — ABNORMAL LOW (ref 8.9–10.3)
Chloride: 98 mmol/L (ref 98–111)
Creatinine, Ser: 1.09 mg/dL — ABNORMAL HIGH (ref 0.44–1.00)
GFR, Estimated: 48 mL/min — ABNORMAL LOW (ref >60)
Glucose, Bld: 101 mg/dL — ABNORMAL HIGH (ref 70–99)
Potassium: 3.1 mmol/L — ABNORMAL LOW (ref 3.5–5.1)
Sodium: 128 mmol/L — ABNORMAL LOW (ref 135–145)
Total Bilirubin: 0.3 mg/dL (ref 0.0–1.2)
Total Protein: 6.1 g/dL — ABNORMAL LOW (ref 6.5–8.1)

## 2024-03-07 LAB — URINE CULTURE: Culture: 30000 — AB

## 2024-03-07 LAB — PHOSPHORUS: Phosphorus: 2.8 mg/dL (ref 2.5–4.6)

## 2024-03-07 LAB — MAGNESIUM: Magnesium: 1.6 mg/dL — ABNORMAL LOW (ref 1.7–2.4)

## 2024-03-07 MED ORDER — SODIUM CHLORIDE 0.9 % IV SOLN: INTRAVENOUS | Status: AC

## 2024-03-07 MED ORDER — POTASSIUM CHLORIDE CRYS ER 20 MEQ PO TBCR
40.0000 meq | EXTENDED_RELEASE_TABLET | Freq: Once | ORAL | Status: AC
  Administered 2024-03-07: 40 meq via ORAL
  Filled 2024-03-07: qty 2

## 2024-03-07 NOTE — Progress Notes (Signed)
 Patient was asleep and son did not want her woken up for vitals.

## 2024-03-07 NOTE — Progress Notes (Addendum)
 PROGRESS NOTE    ASSUNTA Leblanc  Christine Leblanc:[id number] DOB: 01-21-1929 DOA: 03/04/2024 PCP: Benita Stabile, MD  Chief Complaint  Patient presents with   Code Sepsis    Hospital Course:  Christine Leblanc is 89 y.o. female with HTN, MGUS, COPD, seizures, who presented to the ED via EMS due to altered mental status.  At baseline patient is able to maintain a conversation.  Over the week prior to hospitalization she has had increased coughing, and altered mentation.  In the ED she was found to be febrile, BP 180/90, maintaining O2 sats on room air.  Workup revealed anemia, thrombocytopenia, hypokalemia, she tested positive for influenza A.  CT head was unremarkable.  Chest x-ray without acute abnormality.  She did receive ceftriaxone and azithromycin under the presumption that she had pneumonia, however chest x-ray was clear, and procalcitonin unremarkable so antibiotics were further discontinued.  Subjective: Patient is initially sleeping on arrival but is easily arousable.  Denies any acute complaints.  Is able to easily recognize her son at bedside, is confused about her current location.  Her son reports that she was able to read the white board independently earlier today.  She is also endorsing a greater appetite this morning.  Objective: Vitals:   03/05/24 1712 03/05/24 1958 03/06/24 0512 03/06/24 1406  BP: (!) 150/73 (!) 150/89 (!) 156/100 125/83  Pulse: 82 74 89 83  Resp: 16 18 16 16   Temp: 98.8 F (37.1 C) 97.8 F (36.6 C) 98.6 F (37 C)   TempSrc: Oral Oral Oral   SpO2: 99% 98% 96% 93%    Intake/Output Summary (Last 24 hours) at 03/07/2024 9562 Last data filed at 03/06/2024 0931 Gross per 24 hour  Intake --  Output 600 ml  Net -600 ml   There were no vitals filed for this visit.  Examination: General exam: Appears calm and comfortable, NAD  Respiratory system: No work of breathing, on room air, upper airway Rales, productive cough. Cardiovascular system: S1 & S2 heard, RRR.   Gastrointestinal system: Abdomen is nondistended, soft and nontender.  Neuro: Awake, answers questions, oriented to self and family, disoriented to place and situation. Extremities: Symmetric, expected ROM Skin: No rashes, lesions Psychiatry: Calm, otherwise difficult to assess  Assessment & Plan:  Principal Problem:   Influenza A Active Problems:   COPD (chronic obstructive pulmonary disease) (HCC)   Other dysphagia   Essential hypertension   Dehydration   Acquired hypothyroidism   Hypokalemia   History of TIA (transient ischemic attack)   Dysphagia   Hypoalbuminemia due to protein-calorie malnutrition (HCC)   Encephalopathy    Influenza A Acute hypoxic respiratory failure has been ruled out, hypoxemia only  - Suspect her supplemental O2 needs were secondary to poor respiratory effort from generalized fatigue/deconditioning.  No evidence of pneumonia or COPD exacerbation. - On room air now - Continue symptomatic support with Mucinex, Robitussin, as needed DuoNebs - Tylenol as needed - Incentive spirometry and flutter valve.  High risk for superimposed bacterial pneumonia - No evidence of superimposed bacterial pneumonia on CXR, procal neg. No need for antibiotics at this time - No Tamiflu given onset of illness over 5 days prior to arrival  Acute metabolic encephalopathy - Suspect secondary to influenza - Patient is not quite at her baseline but is improving daily.  Family endorses some concerns with bringing her home today.  They believe an additional day in the hospital would benefit her. - Continue frequent reorientation - Delirium precautions - Head  CT on arrival without acute changes, consistent with atrophy and small vessel ischemic changes.  Labs within normal limits. - Urine culture growing Staph epidermidis, suspect that this is contaminant.  No leuks or nitrites on UA.  Patient denies dysuria/suprapubic pain.  Dehydration - s/p IVF -- Cont PO hydration  now  Hypokalemia - Replace as needed  Hypoalbuminemia - Continue protein supplementation  Dysphagia - Patient is on a pured diet outpatient - SLP recs Dysphasia 2, has been added  Thrombocytopenia - Likely reactive due to influenza, further complicated by MGUS as above - Will trend CBC - No occult signs of bleeding at this time  COPD - Not currently in exacerbation - Continue DuoNebs - I-S and FV as above  MGUS - Follows outpatient with oncology, currently stable  Hypertension - Does not appear to be on any antihypertensives outpatient - SBP goal under 150 - As needed hydralazine and labetalol  History of TIA - Continue aspirin and statin (pt no longer takes plavix)  Acquired hypothyroidism - Continue Synthroid  Constipation - Refuses miralax, request fleets enema  Hyponatremia - Likely hypovolemic. Start low rate NS today.   -- Trend CMP  CKD stage IIIb - Creatinine baseline around 1.0, currently stable - Renally dose with a creatinine clearance of  24 when needed - Avoid nephrotoxic meds  Normocytic anemia, chronic - Secondary to CKD and iron deficiency per workup with hematology outpatient - Trend hemoglobin. Currently stable.  Seizure disorder - Remote history of seizures, none in 30 years.  Was previously seen in January for concern of seizure but determined to have TIA.  Not on AEDs at home.  DVT prophylaxis: Lovenox   Code Status: Full Code Family Communication: Discussed with her son at bedside. Disposition: Currently hospitalized for ongoing encephalopathy, improving daily.  Will order for home health physical therapy/OT/HHA at discharge.  Hopeful for DC tomorrow    Consultants:    Procedures:    Antimicrobials:  Anti-infectives (From admission, onward)    Start     Dose/Rate Route Frequency Ordered Stop   03/04/24 1645  cefTRIAXone (ROCEPHIN) 2 g in sodium chloride 0.9 % 100 mL IVPB  Status:  Discontinued        2 g 200 mL/hr over  30 Minutes Intravenous Once 03/04/24 1633 03/04/24 1641   03/04/24 1645  azithromycin (ZITHROMAX) 500 mg in sodium chloride 0.9 % 250 mL IVPB        500 mg 250 mL/hr over 60 Minutes Intravenous  Once 03/04/24 1633 03/04/24 1859   03/04/24 1645  cefTRIAXone (ROCEPHIN) 1 g in sodium chloride 0.9 % 100 mL IVPB        1 g 200 mL/hr over 30 Minutes Intravenous Once 03/04/24 1641 03/04/24 1859       Data Reviewed: I have personally reviewed following labs and imaging studies CBC: Recent Labs  Lab 03/04/24 1630 03/05/24 0550 03/06/24 0321 03/07/24 0631  WBC 6.3 4.0 5.2 10.3  NEUTROABS 4.1  --  3.2 7.1  HGB 9.8* 10.1* 11.7* 10.7*  HCT 30.5* 31.9* 36.1 32.1*  MCV 91.9 92.5 90.0 87.0  PLT 127* 112* 111* 116*   Basic Metabolic Panel: Recent Labs  Lab 03/04/24 1630 03/05/24 0550 03/06/24 0321 03/07/24 0631  NA 135 135 130* 128*  K 3.4* 3.6 3.5 3.1*  CL 101 100 96* 98  CO2 24 26 26 25   GLUCOSE 117* 100* 91 101*  BUN 16 14 16  25*  CREATININE 1.03* 0.95 0.87 1.09*  CALCIUM 9.0  8.5* 8.5* 8.0*  MG  --  1.5* 1.7 1.6*  PHOS  --  3.1 2.9 2.8   GFR: Estimated Creatinine Clearance: 24.9 mL/min (A) (by C-G formula based on SCr of 1.09 mg/dL (H)). Liver Function Tests: Recent Labs  Lab 03/04/24 1630 03/05/24 0550 03/06/24 0321 03/07/24 0631  AST 20 24 36 39  ALT 11 12 14 17   ALKPHOS 59 54 52 49  BILITOT 0.6 0.4 0.6 0.3  PROT 7.2 6.8 7.0 6.1*  ALBUMIN 3.4* 3.1* 3.4* 3.0*   CBG: No results for input(s): "GLUCAP" in the last 168 hours.  Recent Results (from the past 240 hours)  Blood Culture (routine x 2)     Status: None (Preliminary result)   Collection Time: 03/04/24  4:30 PM   Specimen: BLOOD LEFT FOREARM  Result Value Ref Range Status   Specimen Description BLOOD LEFT FOREARM BOTTLES DRAWN AEROBIC ONLY  Final   Special Requests Blood Culture adequate volume  Final   Culture   Final    NO GROWTH 3 DAYS Performed at [facility], [address] [place], [place]  [address]    Report Status PENDING  Incomplete  Resp panel by RT-PCR (RSV, Flu A&B, Covid) Anterior Nasal Swab     Status: Abnormal   Collection Time: 03/04/24  4:38 PM   Specimen: Anterior Nasal Swab  Result Value Ref Range Status   SARS Coronavirus 2 by RT PCR NEGATIVE NEGATIVE Final    Comment: (NOTE) SARS-CoV-2 target nucleic acids are NOT DETECTED.  The SARS-CoV-2 RNA is generally detectable in upper respiratory specimens during the acute phase of infection. The lowest concentration of SARS-CoV-2 viral copies this assay can detect is 138 copies/mL. A negative result does not preclude SARS-Cov-2 infection and should not be used as the sole basis for treatment or other patient management decisions. A negative result may occur with  improper specimen collection/handling, submission of specimen other than nasopharyngeal swab, presence of viral mutation(s) within the areas targeted by this assay, and inadequate number of viral copies(<138 copies/mL). A negative result must be combined with clinical observations, patient history, and epidemiological information. The expected result is Negative.  Fact Sheet for Patients:  BloggerCourse.com  Fact Sheet for Healthcare Providers:  SeriousBroker.it  This test is no t yet approved or cleared by the [place] FDA and  has been authorized for detection and/or diagnosis of SARS-CoV-2 by FDA under an Emergency Use Authorization (EUA). This EUA will remain  in effect (meaning this test can be used) for the duration of the COVID-19 declaration under Section 564(b)(1) of the Act, 21 U.S.C.section 360bbb-3(b)(1), unless the authorization is terminated  or revoked sooner.       Influenza A by PCR POSITIVE (A) NEGATIVE Final   Influenza B by PCR NEGATIVE NEGATIVE Final    Comment: (NOTE) The Xpert Xpress SARS-CoV-2/FLU/RSV plus assay is intended as an aid in the diagnosis of influenza from  Nasopharyngeal swab specimens and should not be used as a sole basis for treatment. Nasal washings and aspirates are unacceptable for Xpert Xpress SARS-CoV-2/FLU/RSV testing.  Fact Sheet for Patients: BloggerCourse.com  Fact Sheet for Healthcare Providers: SeriousBroker.it  This test is not yet approved or cleared by the [place] FDA and has been authorized for detection and/or diagnosis of SARS-CoV-2 by FDA under an Emergency Use Authorization (EUA). This EUA will remain in effect (meaning this test can be used) for the duration of the COVID-19 declaration under Section 564(b)(1) of the Act, 21 U.S.C.  section 360bbb-3(b)(1), unless the authorization is terminated or revoked.     Resp Syncytial Virus by PCR NEGATIVE NEGATIVE Final    Comment: (NOTE) Fact Sheet for Patients: BloggerCourse.com  Fact Sheet for Healthcare Providers: SeriousBroker.it  This test is not yet approved or cleared by the [place] FDA and has been authorized for detection and/or diagnosis of SARS-CoV-2 by FDA under an Emergency Use Authorization (EUA). This EUA will remain in effect (meaning this test can be used) for the duration of the COVID-19 declaration under Section 564(b)(1) of the Act, 21 U.S.C. section 360bbb-3(b)(1), unless the authorization is terminated or revoked.  Performed at [facility] [address], [place], [place] [address]   Blood Culture (routine x 2)     Status: None (Preliminary result)   Collection Time: 03/04/24  5:28 PM   Specimen: BLOOD  Result Value Ref Range Status   Specimen Description BLOOD BLOOD LEFT HAND BOTTLES DRAWN AEROBIC ONLY  Final   Special Requests   Final    Blood Culture results may not be optimal due to an inadequate volume of blood received in culture bottles   Culture   Final    NO GROWTH 3 DAYS Performed at [facility], [address],  [place], [place] [address]    Report Status PENDING  Incomplete  Urine Culture     Status: Abnormal (Preliminary result)   Collection Time: 03/04/24  8:48 PM   Specimen: Urine, Random  Result Value Ref Range Status   Specimen Description   Final    URINE, RANDOM Performed at [facility], [address] [place], [place] [address]    Special Requests   Final    NONE Reflexed from [id number] Performed at [facility], [address], [place], [place] [address]    Culture 30,000 COLONIES/mL STAPHYLOCOCCUS EPIDERMIDIS (A)  Final   Report Status PENDING  Incomplete     Radiology Studies: No results found.   Scheduled Meds:  aspirin EC  81 mg Oral Daily   atorvastatin  40 mg Oral QHS   dextromethorphan-guaiFENesin  1 tablet Oral BID   enoxaparin (LOVENOX) injection  30 mg Subcutaneous Q24H   fluticasone  2 spray Each Nare Daily   levothyroxine  88 mcg Oral Q0600   senna  2 tablet Oral QHS   Continuous Infusions:     LOS: 3 days   Total time spent interpreting labs and vitals, coordinating care amongst consultants and care team members, directly assessing and discussing care with the patient and/or family: 45 min   Debarah Crape, DO [facility]  To contact the attending physician between 7A-7P please use Epic Chat. To contact the covering physician during after hours 7P-7A, please review Amion.   03/07/2024, 9:03 AM   *This document has been created with the assistance of dictation software. Please excuse typographical errors. *

## 2024-03-07 NOTE — Progress Notes (Signed)
 Family is still concerned about patient being confused. MD Dezii aware and will round shortly.

## 2024-03-07 NOTE — Progress Notes (Signed)
   03/07/24 1317  Vitals  Temp 98.8 F (37.1 C)  BP (!) 152/81  MAP (mmHg) 103  BP Location Left Arm  BP Method Automatic  Patient Position (if appropriate) Lying  Pulse Rate 85  Pulse Rate Source Monitor  Resp 16  MEWS COLOR  MEWS Score Color Green  Oxygen Therapy  SpO2 92 %  O2 Device Nasal Cannula  O2 Flow Rate (L/min) 2.5 L/min  MEWS Score  MEWS Temp 0  MEWS Systolic 0  MEWS Pulse 0  MEWS RR 0  MEWS LOC 0  MEWS Score 0   MD Dezii notified of supplemental oxygen applied to patient.

## 2024-03-07 NOTE — Plan of Care (Signed)

## 2024-03-07 NOTE — Progress Notes (Signed)
 Patient's son came out to desk without a mask. I asked could I help him. He said he hoped so. He wanted to speak to the nurse. I asked him did he come out of room 311. And he stated yes. I said your mom has the flu and I need  you to wear a mask if you are going to walk from that room to the nurses station to talk to [place]. He laughed and stated he has been doing this all week. He was also told to use his call light instead of coming in and out of the room if he did not want to wear a mask.

## 2024-03-07 NOTE — Progress Notes (Signed)
 Patient was asleep and son did not want patient woken up for vitals.

## 2024-03-08 DIAGNOSIS — R1310 Dysphagia, unspecified: Secondary | ICD-10-CM | POA: Diagnosis not present

## 2024-03-08 DIAGNOSIS — E876 Hypokalemia: Secondary | ICD-10-CM | POA: Diagnosis not present

## 2024-03-08 DIAGNOSIS — E039 Hypothyroidism, unspecified: Secondary | ICD-10-CM | POA: Diagnosis not present

## 2024-03-08 DIAGNOSIS — E8809 Other disorders of plasma-protein metabolism, not elsewhere classified: Secondary | ICD-10-CM | POA: Diagnosis not present

## 2024-03-08 DIAGNOSIS — E860 Dehydration: Secondary | ICD-10-CM | POA: Diagnosis not present

## 2024-03-08 DIAGNOSIS — E46 Unspecified protein-calorie malnutrition: Secondary | ICD-10-CM | POA: Diagnosis not present

## 2024-03-08 DIAGNOSIS — G9340 Encephalopathy, unspecified: Secondary | ICD-10-CM | POA: Diagnosis not present

## 2024-03-08 DIAGNOSIS — R1319 Other dysphagia: Secondary | ICD-10-CM | POA: Diagnosis not present

## 2024-03-08 DIAGNOSIS — J439 Emphysema, unspecified: Secondary | ICD-10-CM | POA: Diagnosis not present

## 2024-03-08 DIAGNOSIS — Z8673 Personal history of transient ischemic attack (TIA), and cerebral infarction without residual deficits: Secondary | ICD-10-CM | POA: Diagnosis not present

## 2024-03-08 DIAGNOSIS — J101 Influenza due to other identified influenza virus with other respiratory manifestations: Secondary | ICD-10-CM | POA: Diagnosis not present

## 2024-03-08 DIAGNOSIS — I10 Essential (primary) hypertension: Secondary | ICD-10-CM | POA: Diagnosis not present

## 2024-03-08 LAB — CBC WITH DIFFERENTIAL/PLATELET
Abs Immature Granulocytes: 0.03 10*3/uL (ref 0.00–0.07)
Basophils Absolute: 0 10*3/uL (ref 0.0–0.1)
Basophils Relative: 0 %
Eosinophils Absolute: 0 10*3/uL (ref 0.0–0.5)
Eosinophils Relative: 0 %
HCT: 36.7 % (ref 36.0–46.0)
Hemoglobin: 11.8 g/dL — ABNORMAL LOW (ref 12.0–15.0)
Immature Granulocytes: 0 %
Lymphocytes Relative: 16 %
Lymphs Abs: 1.3 10*3/uL (ref 0.7–4.0)
MCH: 29.1 pg (ref 26.0–34.0)
MCHC: 32.2 g/dL (ref 30.0–36.0)
MCV: 90.6 fL (ref 80.0–100.0)
Monocytes Absolute: 1.2 10*3/uL — ABNORMAL HIGH (ref 0.1–1.0)
Monocytes Relative: 15 %
Neutro Abs: 5.6 10*3/uL (ref 1.7–7.7)
Neutrophils Relative %: 69 %
Platelets: 121 10*3/uL — ABNORMAL LOW (ref 150–400)
RBC: 4.05 MIL/uL (ref 3.87–5.11)
RDW: 14.6 % (ref 11.5–15.5)
WBC: 8.2 10*3/uL (ref 4.0–10.5)
nRBC: 0 % (ref 0.0–0.2)

## 2024-03-08 LAB — COMPREHENSIVE METABOLIC PANEL
ALT: 18 U/L (ref 0–44)
AST: 42 U/L — ABNORMAL HIGH (ref 15–41)
Albumin: 3 g/dL — ABNORMAL LOW (ref 3.5–5.0)
Alkaline Phosphatase: 52 U/L (ref 38–126)
Anion gap: 10 (ref 5–15)
BUN: 26 mg/dL — ABNORMAL HIGH (ref 8–23)
CO2: 25 mmol/L (ref 22–32)
Calcium: 8.6 mg/dL — ABNORMAL LOW (ref 8.9–10.3)
Chloride: 99 mmol/L (ref 98–111)
Creatinine, Ser: 0.96 mg/dL (ref 0.44–1.00)
GFR, Estimated: 56 mL/min — ABNORMAL LOW (ref >60)
Glucose, Bld: 95 mg/dL (ref 70–99)
Potassium: 4.1 mmol/L (ref 3.5–5.1)
Sodium: 134 mmol/L — ABNORMAL LOW (ref 135–145)
Total Bilirubin: 0.3 mg/dL (ref 0.0–1.2)
Total Protein: 6.5 g/dL (ref 6.5–8.1)

## 2024-03-08 LAB — PHOSPHORUS: Phosphorus: 2.5 mg/dL (ref 2.5–4.6)

## 2024-03-08 LAB — MAGNESIUM: Magnesium: 1.9 mg/dL (ref 1.7–2.4)

## 2024-03-08 NOTE — Progress Notes (Signed)
 PROGRESS NOTE    Christine Leblanc  BMW:[id number] DOB: 14-Dec-1929 DOA: 03/04/2024 PCP: Benita Stabile, MD  Chief Complaint  Patient presents with   Code Sepsis    Hospital Course:  Christine Leblanc is 89 y.o. female with HTN, MGUS, COPD, seizures, who presented to the ED via EMS due to altered mental status.  At baseline patient is able to maintain a conversation.  Over the week prior to hospitalization she has had increased coughing, and altered mentation.  In the ED she was found to be febrile, BP 180/90, maintaining O2 sats on room air.  Workup revealed anemia, thrombocytopenia, hypokalemia, she tested positive for influenza A.  CT head was unremarkable.  Chest x-ray without acute abnormality.  She did receive ceftriaxone and azithromycin under the presumption that she had pneumonia, however chest x-ray was clear, and procalcitonin unremarkable so antibiotics were further discontinued.  Subjective: No acute events overnight.  Patient is alert and oriented this morning.  She continues to be more alert each day.  Unfortunately she has gone back on oxygen overnight.  She is currently 91% on 2.5L.  She has not been using incentive spirometry.  Instructed her and demonstrated use at bedside.  Objective: Vitals:   03/07/24 2013 03/07/24 2200 03/08/24 0558 03/08/24 0900  BP: (!) 166/89 (!) 144/60 (!) 157/103   Pulse: 87  88   Resp:      Temp: 98.1 F (36.7 C)  99.7 F (37.6 C)   TempSrc: Oral  Oral   SpO2: 98%  96% 91%    Intake/Output Summary (Last 24 hours) at 03/08/2024 1004 Last data filed at 03/08/2024 2725 Gross per 24 hour  Intake 783.75 ml  Output 700 ml  Net 83.75 ml   There were no vitals filed for this visit.  Examination: General exam: Appears calm and comfortable, NAD  Respiratory system: No work of breathing, on 2L, upper airway Rales, productive cough. No wheeze Cardiovascular system: S1 & S2 heard, RRR.  Gastrointestinal system: Abdomen is nondistended, soft and nontender.   Neuro: Awake, answers questions, follows commands Extremities: Symmetric, expected ROM Skin: No rashes, lesions Psychiatry: Calm, otherwise difficult to assess  Assessment & Plan:  Principal Problem:   Influenza A Active Problems:   COPD (chronic obstructive pulmonary disease) (HCC)   Other dysphagia   Essential hypertension   Dehydration   Acquired hypothyroidism   Hypokalemia   History of TIA (transient ischemic attack)   Dysphagia   Hypoalbuminemia due to protein-calorie malnutrition (HCC)   Encephalopathy    Influenza A Acute hypoxic respiratory failure has been ruled out, hypoxemia only  - Suspect her supplemental O2 needs are secondary to poor respiratory effort from generalized fatigue/deconditioning.   - Reiterated the importance of incentive spirometry and flutter valve.  Remains high risk for superimposed bacterial pneumonia though no evidence of such at this time - Continue to wean oxygen as tolerated.  Does not use O2 at home. - Continue symptomatic support with Mucinex, Robitussin, as needed DuoNebs - Tylenol as needed - No evidence of superimposed bacterial pneumonia on CXR, procal neg. No need for antibiotics at this time - No Tamiflu given onset of illness over 5 days prior to arrival  Acute metabolic encephalopathy, resolved - Secondary to influenza - Patient is near her mental status baseline now. - Continue frequent reorientation - Delirium precautions - Head CT on arrival without acute changes, consistent with atrophy and small vessel ischemic changes.  Labs within normal limits. - Urine culture  growing Staph epidermidis, suspect that this is contaminant.  No leuks or nitrites on UA.  Patient denies dysuria/suprapubic pain.  Dehydration - s/p IVF -- Cont PO hydration now  Hypokalemia - Replace as needed  Hypoalbuminemia - Continue protein supplementation  Dysphagia - Patient is on a pured diet outpatient - SLP recs Dysphasia 2, has been  added  Thrombocytopenia - Likely reactive due to influenza, further complicated by MGUS as above - Will trend CBC - No occult signs of bleeding at this time  COPD - Not currently in exacerbation - Continue DuoNebs - I-S and FV as above  MGUS - Follows outpatient with oncology, currently stable  Hypertension - Does not appear to be on any antihypertensives outpatient - SBP goal under 150 - As needed hydralazine and labetalol  History of TIA - Continue aspirin and statin (pt no longer takes plavix)  Acquired hypothyroidism - Continue Synthroid  Constipation - Refuses miralax, request fleets enema  Hyponatremia - Likely hypovolemic. Start low rate NS today.   -- Trend CMP  CKD stage IIIb - Creatinine baseline around 1.0, currently stable - Renally dose with a creatinine clearance of  28 when needed - Avoid nephrotoxic meds  Normocytic anemia, chronic - Secondary to CKD and iron deficiency per workup with hematology outpatient - Trend hemoglobin. Currently stable.  Seizure disorder - Remote history of seizures, none in 30 years.  Was previously seen in January for concern of seizure but determined to have TIA.  Not on AEDs at home.  DVT prophylaxis: Lovenox   Code Status: Full Code Family Communication: Discussed with her daughter at bedside. Disposition: Currently hospitalized for oxygen needs.  Has returned to her mental status baseline.  Still requiring supplemental O2. Have ordered for home health physical therapy/OT/HHA at discharge.  Hopeful for DC tomorrow    Consultants:    Procedures:    Antimicrobials:  Anti-infectives (From admission, onward)    Start     Dose/Rate Route Frequency Ordered Stop   03/04/24 1645  cefTRIAXone (ROCEPHIN) 2 g in sodium chloride 0.9 % 100 mL IVPB  Status:  Discontinued        2 g 200 mL/hr over 30 Minutes Intravenous Once 03/04/24 1633 03/04/24 1641   03/04/24 1645  azithromycin (ZITHROMAX) 500 mg in sodium chloride  0.9 % 250 mL IVPB        500 mg 250 mL/hr over 60 Minutes Intravenous  Once 03/04/24 1633 03/04/24 1859   03/04/24 1645  cefTRIAXone (ROCEPHIN) 1 g in sodium chloride 0.9 % 100 mL IVPB        1 g 200 mL/hr over 30 Minutes Intravenous Once 03/04/24 1641 03/04/24 1859       Data Reviewed: I have personally reviewed following labs and imaging studies CBC: Recent Labs  Lab 03/04/24 1630 03/05/24 0550 03/06/24 0321 03/07/24 0631 03/08/24 0353  WBC 6.3 4.0 5.2 10.3 8.2  NEUTROABS 4.1  --  3.2 7.1 5.6  HGB 9.8* 10.1* 11.7* 10.7* 11.8*  HCT 30.5* 31.9* 36.1 32.1* 36.7  MCV 91.9 92.5 90.0 87.0 90.6  PLT 127* 112* 111* 116* 121*   Basic Metabolic Panel: Recent Labs  Lab 03/04/24 1630 03/05/24 0550 03/06/24 0321 03/07/24 0631 03/08/24 0353  NA 135 135 130* 128* 134*  K 3.4* 3.6 3.5 3.1* 4.1  CL 101 100 96* 98 99  CO2 24 26 26 25 25   GLUCOSE 117* 100* 91 101* 95  BUN 16 14 16  25* 26*  CREATININE 1.03* 0.95 0.87  1.09* 0.96  CALCIUM 9.0 8.5* 8.5* 8.0* 8.6*  MG  --  1.5* 1.7 1.6* 1.9  PHOS  --  3.1 2.9 2.8 2.5   GFR: Estimated Creatinine Clearance: 28.2 mL/min (by C-G formula based on SCr of 0.96 mg/dL). Liver Function Tests: Recent Labs  Lab 03/04/24 1630 03/05/24 0550 03/06/24 0321 03/07/24 0631 03/08/24 0353  AST 20 24 36 39 42*  ALT 11 12 14 17 18   ALKPHOS 59 54 52 49 52  BILITOT 0.6 0.4 0.6 0.3 0.3  PROT 7.2 6.8 7.0 6.1* 6.5  ALBUMIN 3.4* 3.1* 3.4* 3.0* 3.0*   CBG: No results for input(s): "GLUCAP" in the last 168 hours.  Recent Results (from the past 240 hours)  Blood Culture (routine x 2)     Status: None (Preliminary result)   Collection Time: 03/04/24  4:30 PM   Specimen: BLOOD LEFT FOREARM  Result Value Ref Range Status   Specimen Description BLOOD LEFT FOREARM BOTTLES DRAWN AEROBIC ONLY  Final   Special Requests Blood Culture adequate volume  Final   Culture   Final    NO GROWTH 4 DAYS Performed at [facility], [address] [place], [place]  [address]    Report Status PENDING  Incomplete  Resp panel by RT-PCR (RSV, Flu A&B, Covid) Anterior Nasal Swab     Status: Abnormal   Collection Time: 03/04/24  4:38 PM   Specimen: Anterior Nasal Swab  Result Value Ref Range Status   SARS Coronavirus 2 by RT PCR NEGATIVE NEGATIVE Final    Comment: (NOTE) SARS-CoV-2 target nucleic acids are NOT DETECTED.  The SARS-CoV-2 RNA is generally detectable in upper respiratory specimens during the acute phase of infection. The lowest concentration of SARS-CoV-2 viral copies this assay can detect is 138 copies/mL. A negative result does not preclude SARS-Cov-2 infection and should not be used as the sole basis for treatment or other patient management decisions. A negative result may occur with  improper specimen collection/handling, submission of specimen other than nasopharyngeal swab, presence of viral mutation(s) within the areas targeted by this assay, and inadequate number of viral copies(<138 copies/mL). A negative result must be combined with clinical observations, patient history, and epidemiological information. The expected result is Negative.  Fact Sheet for Patients:  BloggerCourse.com  Fact Sheet for Healthcare Providers:  SeriousBroker.it  This test is no t yet approved or cleared by the [place] FDA and  has been authorized for detection and/or diagnosis of SARS-CoV-2 by FDA under an Emergency Use Authorization (EUA). This EUA will remain  in effect (meaning this test can be used) for the duration of the COVID-19 declaration under Section 564(b)(1) of the Act, 21 U.S.C.section 360bbb-3(b)(1), unless the authorization is terminated  or revoked sooner.       Influenza A by PCR POSITIVE (A) NEGATIVE Final   Influenza B by PCR NEGATIVE NEGATIVE Final    Comment: (NOTE) The Xpert Xpress SARS-CoV-2/FLU/RSV plus assay is intended as an aid in the diagnosis of influenza from  Nasopharyngeal swab specimens and should not be used as a sole basis for treatment. Nasal washings and aspirates are unacceptable for Xpert Xpress SARS-CoV-2/FLU/RSV testing.  Fact Sheet for Patients: BloggerCourse.com  Fact Sheet for Healthcare Providers: SeriousBroker.it  This test is not yet approved or cleared by the [place] FDA and has been authorized for detection and/or diagnosis of SARS-CoV-2 by FDA under an Emergency Use Authorization (EUA). This EUA will remain in effect (meaning this test can be used) for  the duration of the COVID-19 declaration under Section 564(b)(1) of the Act, 21 U.S.C. section 360bbb-3(b)(1), unless the authorization is terminated or revoked.     Resp Syncytial Virus by PCR NEGATIVE NEGATIVE Final    Comment: (NOTE) Fact Sheet for Patients: BloggerCourse.com  Fact Sheet for Healthcare Providers: SeriousBroker.it  This test is not yet approved or cleared by the [place] FDA and has been authorized for detection and/or diagnosis of SARS-CoV-2 by FDA under an Emergency Use Authorization (EUA). This EUA will remain in effect (meaning this test can be used) for the duration of the COVID-19 declaration under Section 564(b)(1) of the Act, 21 U.S.C. section 360bbb-3(b)(1), unless the authorization is terminated or revoked.  Performed at [facility], [address] [place], [place] [address]   Blood Culture (routine x 2)     Status: None (Preliminary result)   Collection Time: 03/04/24  5:28 PM   Specimen: BLOOD  Result Value Ref Range Status   Specimen Description BLOOD BLOOD LEFT HAND BOTTLES DRAWN AEROBIC ONLY  Final   Special Requests   Final    Blood Culture results may not be optimal due to an inadequate volume of blood received in culture bottles   Culture   Final    NO GROWTH 4 DAYS Performed at [facility], [address],  [place], [place] [address]    Report Status PENDING  Incomplete  Urine Culture     Status: Abnormal   Collection Time: 03/04/24  8:48 PM   Specimen: Urine, Random  Result Value Ref Range Status   Specimen Description   Final    URINE, RANDOM Performed at [facility], [address], [place], [place] [address]    Special Requests   Final    NONE Reflexed from [id number] Performed at [facility], [address], [place], [place] [address]    Culture 30,000 COLONIES/mL STAPHYLOCOCCUS EPIDERMIDIS (A)  Final   Report Status 03/07/2024 FINAL  Final   Organism ID, Bacteria STAPHYLOCOCCUS EPIDERMIDIS (A)  Final      Susceptibility   Staphylococcus epidermidis - MIC*    CIPROFLOXACIN >=8 RESISTANT Resistant     GENTAMICIN <=0.5 SENSITIVE Sensitive     NITROFURANTOIN <=16 SENSITIVE Sensitive     OXACILLIN >=4 RESISTANT Resistant     TETRACYCLINE 2 SENSITIVE Sensitive     VANCOMYCIN 1 SENSITIVE Sensitive     TRIMETH/SULFA <=10 SENSITIVE Sensitive     RIFAMPIN <=0.5 SENSITIVE Sensitive     Inducible Clindamycin NEGATIVE Sensitive     * 30,000 COLONIES/mL STAPHYLOCOCCUS EPIDERMIDIS     Radiology Studies: No results found.   Scheduled Meds:  aspirin EC  81 mg Oral Daily   atorvastatin  40 mg Oral QHS   dextromethorphan-guaiFENesin  1 tablet Oral BID   enoxaparin (LOVENOX) injection  30 mg Subcutaneous Q24H   fluticasone  2 spray Each Nare Daily   levothyroxine  88 mcg Oral Q0600   senna  2 tablet Oral QHS   Continuous Infusions:  sodium chloride Stopped (03/07/24 2245)      LOS: 4 days   Total time spent interpreting labs and vitals, coordinating care amongst consultants and care team members, directly assessing and discussing care with the patient and/or family: 45 min   Debarah Crape, DO [facility]  To contact the attending physician between 7A-7P please use Epic Chat. To contact the covering physician during after hours 7P-7A, please review Amion.   03/08/2024, 10:04 AM    *This document has been  created with the assistance of dictation software. Please excuse typographical errors. *

## 2024-03-08 NOTE — Plan of Care (Signed)

## 2024-03-09 ENCOUNTER — Other Ambulatory Visit: Payer: Self-pay

## 2024-03-09 ENCOUNTER — Encounter (HOSPITAL_COMMUNITY): Payer: Self-pay

## 2024-03-09 ENCOUNTER — Emergency Department (HOSPITAL_COMMUNITY)
Admission: EM | Admit: 2024-03-09 | Discharge: 2024-03-10 | Disposition: A | Discharge status: Home / Self Care | Attending: Emergency Medicine | Admitting: Emergency Medicine

## 2024-03-09 DIAGNOSIS — S99921A Unspecified injury of right foot, initial encounter: Secondary | ICD-10-CM | POA: Diagnosis present

## 2024-03-09 DIAGNOSIS — S90221A Contusion of right lesser toe(s) with damage to nail, initial encounter: Secondary | ICD-10-CM | POA: Diagnosis not present

## 2024-03-09 DIAGNOSIS — E039 Hypothyroidism, unspecified: Secondary | ICD-10-CM | POA: Insufficient documentation

## 2024-03-09 DIAGNOSIS — J449 Chronic obstructive pulmonary disease, unspecified: Secondary | ICD-10-CM | POA: Insufficient documentation

## 2024-03-09 DIAGNOSIS — Z7982 Long term (current) use of aspirin: Secondary | ICD-10-CM | POA: Insufficient documentation

## 2024-03-09 DIAGNOSIS — I10 Essential (primary) hypertension: Secondary | ICD-10-CM | POA: Insufficient documentation

## 2024-03-09 DIAGNOSIS — J40 Bronchitis, not specified as acute or chronic: Secondary | ICD-10-CM

## 2024-03-09 DIAGNOSIS — R059 Cough, unspecified: Secondary | ICD-10-CM | POA: Insufficient documentation

## 2024-03-09 DIAGNOSIS — J101 Influenza due to other identified influenza virus with other respiratory manifestations: Secondary | ICD-10-CM | POA: Diagnosis not present

## 2024-03-09 DIAGNOSIS — Z79899 Other long term (current) drug therapy: Secondary | ICD-10-CM | POA: Diagnosis not present

## 2024-03-09 DIAGNOSIS — S90229A Contusion of unspecified lesser toe(s) with damage to nail, initial encounter: Secondary | ICD-10-CM

## 2024-03-09 DIAGNOSIS — X58XXXA Exposure to other specified factors, initial encounter: Secondary | ICD-10-CM | POA: Diagnosis not present

## 2024-03-09 LAB — COMPREHENSIVE METABOLIC PANEL
ALT: 17 U/L (ref 0–44)
AST: 38 U/L (ref 15–41)
Albumin: 2.7 g/dL — ABNORMAL LOW (ref 3.5–5.0)
Alkaline Phosphatase: 45 U/L (ref 38–126)
Anion gap: 8 (ref 5–15)
BUN: 22 mg/dL (ref 8–23)
CO2: 26 mmol/L (ref 22–32)
Calcium: 8.4 mg/dL — ABNORMAL LOW (ref 8.9–10.3)
Chloride: 98 mmol/L (ref 98–111)
Creatinine, Ser: 0.91 mg/dL (ref 0.44–1.00)
GFR, Estimated: 59 mL/min — ABNORMAL LOW (ref >60)
Glucose, Bld: 100 mg/dL — ABNORMAL HIGH (ref 70–99)
Potassium: 3.2 mmol/L — ABNORMAL LOW (ref 3.5–5.1)
Sodium: 132 mmol/L — ABNORMAL LOW (ref 135–145)
Total Bilirubin: 0.4 mg/dL (ref 0.0–1.2)
Total Protein: 6.2 g/dL — ABNORMAL LOW (ref 6.5–8.1)

## 2024-03-09 LAB — MAGNESIUM: Magnesium: 1.7 mg/dL (ref 1.7–2.4)

## 2024-03-09 LAB — CULTURE, BLOOD (ROUTINE X 2)
Culture: NO GROWTH
Culture: NO GROWTH
Special Requests: ADEQUATE

## 2024-03-09 LAB — CBC WITH DIFFERENTIAL/PLATELET
Abs Immature Granulocytes: 0 10*3/uL (ref 0.00–0.07)
Basophils Absolute: 0 10*3/uL (ref 0.0–0.1)
Basophils Relative: 0 %
Eosinophils Absolute: 0 10*3/uL (ref 0.0–0.5)
Eosinophils Relative: 0 %
HCT: 32.8 % — ABNORMAL LOW (ref 36.0–46.0)
Hemoglobin: 10.6 g/dL — ABNORMAL LOW (ref 12.0–15.0)
Lymphocytes Relative: 27 %
Lymphs Abs: 1.8 10*3/uL (ref 0.7–4.0)
MCH: 29.4 pg (ref 26.0–34.0)
MCHC: 32.3 g/dL (ref 30.0–36.0)
MCV: 90.9 fL (ref 80.0–100.0)
Monocytes Absolute: 0.5 10*3/uL (ref 0.1–1.0)
Monocytes Relative: 8 %
Neutro Abs: 4.4 10*3/uL (ref 1.7–7.7)
Neutrophils Relative %: 65 %
Platelets: 113 10*3/uL — ABNORMAL LOW (ref 150–400)
RBC: 3.61 MIL/uL — ABNORMAL LOW (ref 3.87–5.11)
RDW: 14.6 % (ref 11.5–15.5)
WBC: 6.7 10*3/uL (ref 4.0–10.5)
nRBC: 0 % (ref 0.0–0.2)

## 2024-03-09 LAB — PHOSPHORUS: Phosphorus: 2.6 mg/dL (ref 2.5–4.6)

## 2024-03-09 MED ORDER — POTASSIUM CHLORIDE CRYS ER 20 MEQ PO TBCR
40.0000 meq | EXTENDED_RELEASE_TABLET | Freq: Once | ORAL | Status: AC
  Administered 2024-03-09: 40 meq via ORAL
  Filled 2024-03-09: qty 2

## 2024-03-09 NOTE — Discharge Summary (Signed)
 Physician Discharge Summary   Patient: Christine Leblanc MRN: [id number] DOB: 12-27-1929  Admit date:     03/04/2024  Discharge date: 03/09/24  Discharge Physician: Debarah Crape   PCP: Benita Stabile, MD   Recommendations at discharge:    Follow up with PCP for chronic medication management  Discharge Diagnoses: Principal Problem:   Influenza A Active Problems:   COPD (chronic obstructive pulmonary disease) (HCC)   Other dysphagia   Essential hypertension   Dehydration   Acquired hypothyroidism   Hypokalemia   History of TIA (transient ischemic attack)   Dysphagia   Hypoalbuminemia due to protein-calorie malnutrition (HCC)   Encephalopathy  Resolved Problems:   * No resolved hospital problems. *  Hospital Course: Christine Leblanc is 89 y.o. female with HTN, MGUS, COPD, questionable seizures, who presented to the ED via EMS due to altered mental status.  Over the week prior to hospitalization she has had increased coughing, and altered mentation.  In the ED she was found to be febrile, BP 180/90, maintaining O2 sats on room air.  Workup revealed anemia, thrombocytopenia, hypokalemia, she tested positive for influenza A.  CT head was unremarkable.  Chest x-ray without acute abnormality.  She did receive ceftriaxone and azithromycin under the presumption that she had pneumonia, however chest x-ray was clear, and procalcitonin unremarkable so antibiotics were further discontinued.  Patient was admitted for treatment.  Ultimately her metabolic encephalopathy was thought to be secondary to influenza.  With supportive treatment she gradually returned to baseline.  Stay was prolonged due to supplemental oxygen needs.  Her hypoxia was driven by poor respiratory effort due to generalized fatigue and deconditioning. By 3/10 patient had returned to her physiologic baseline and was endorsing readiness to go home.  Home health PT/OT were arranged prior to discharge.  She will need to follow-up with her  primary care physician for chronic medication management.  Plan was discussed with the patient as well as with her daughter who is at bedside.  Influenza A Acute hypoxic respiratory failure has been ruled out, hypoxemia only  - Suspect her supplemental O2 needs are secondary to poor respiratory effort from generalized fatigue/deconditioning.   - Reiterated the importance of incentive spirometry and flutter valve.  Remains high risk for superimposed bacterial pneumonia though no evidence of such at this time - Has weaned to room air. - Symptomatic support with Mucinex, Robitussin, as needed DuoNebs - Tylenol as needed - No evidence of superimposed bacterial pneumonia on CXR, procal neg. No need for antibiotics at this time - No Tamiflu given onset of illness over 5 days prior to arrival   Acute metabolic encephalopathy, resolved - Secondary to influenza - Patient is near her mental status baseline now. - Continue frequent reorientation - Delirium precautions - Head CT on arrival without acute changes, consistent with atrophy and small vessel ischemic changes.  Labs within normal limits. - Urine culture growing Staph epidermidis, suspect that this is contaminant.  No leuks or nitrites on UA.  Patient denies dysuria/suprapubic pain.   Dehydration - s/p IVF -- Cont PO hydration now   Hypokalemia - Replace as needed   Hypoalbuminemia - Continue protein supplementation   Dysphagia - Patient is on a pured diet outpatient   Thrombocytopenia - Likely reactive due to influenza, further complicated by MGUS as above - Will trend CBC - No occult signs of bleeding at this time   COPD - Not currently in exacerbation - Continue DuoNebs - I-S and FV as  above   MGUS - Follows outpatient with oncology, currently stable   Hypertension - Does not appear to be on any antihypertensives outpatient - SBP goal under 150 - As needed hydralazine and labetalol   History of TIA - Continue  aspirin and statin (pt no longer takes plavix)   Acquired hypothyroidism - Continue Synthroid   Constipation - Refuses miralax, request fleets enema   Hyponatremia - Likely hypovolemic. Resolving. -- Trend CMP outpatient   CKD stage IIIb - Creatinine baseline around 1.0, currently stable - Renally dose with a creatinine clearance of  29 when needed - Avoid nephrotoxic meds   Normocytic anemia, chronic - Secondary to CKD and iron deficiency per workup with hematology outpatient - Trend hemoglobin. Currently stable.   Seizure disorder - Remote history of seizures, none in 30 years.  Was previously seen in January for concern of seizure but determined to have TIA.  Not on AEDs at home.  Diet recommendation:  Discharge Diet Orders (From admission, onward)     Start     Ordered   03/09/24 0000  Diet general        03/09/24 1034           Regular diet DISCHARGE MEDICATION: Allergies as of 03/09/2024       Reactions   Celecoxib Shortness Of Breath   Dexlansoprazole Anaphylaxis, Other (See Comments)   abd pain   Famotidine Other (See Comments)   Makes her feel "very bad"        Medication List     TAKE these medications    aspirin EC 81 MG tablet Take 1 tablet (81 mg total) by mouth daily. Swallow whole.   atorvastatin 40 MG tablet Commonly known as: LIPITOR Take 1 tablet (40 mg total) by mouth at bedtime.   Carbinoxamine Maleate 4 MG Tabs Take 1 tablet (4 mg total) by mouth at bedtime.   diazepam 5 MG tablet Commonly known as: VALIUM Take 2.5 mg by mouth daily as needed for anxiety.   ferrous sulfate 325 (65 FE) MG tablet Take 325 mg by mouth. Takes one on Monday, Wednesday and Friday   fluticasone 50 MCG/ACT nasal spray Commonly known as: FLONASE Place 2 sprays into both nostrils daily.   ipratropium 0.06 % nasal spray Commonly known as: ATROVENT Place 1 spray into both nostrils 4 (four) times daily as needed for rhinitis.   levothyroxine 88  MCG tablet Commonly known as: SYNTHROID TAKE 1 TABLET DAILY ON MONDAY THROUGH SATURDAY AND ONE-HALF (1/2) TABLET ON SUNDAY   pantoprazole 40 MG tablet Commonly known as: PROTONIX Take 1 tablet (40 mg total) by mouth 2 (two) times daily before a meal.   senna 8.6 MG tablet Commonly known as: SENOKOT Take 2 tablets by mouth at bedtime.        Follow-up Information     [facility] Follow up.   Specialty: Home Health Services Why: Agency will call for home visit appointment. Contact information: [address] [place] [address] [phone]                Discharge Exam: There were no vitals filed for this visit.    03/09/2024    6:07 AM 03/08/2024    8:25 PM 03/08/2024   12:53 PM  Vitals with BMI  Systolic 128 150 914  Diastolic 72 80 73  Pulse 75 85 87   Constitutional:  Normal appearance. Non toxic-appearing. Frail. HENT: Head Normocephalic and atraumatic.  Mucous  membranes are moist.  Eyes:  Extraocular intact. Conjunctivae normal. Pupils are equal, round, and reactive to light.  Cardiovascular: Rate and Rhythm: Normal rate and regular rhythm.  Pulmonary: Non labored, symmetric rise of chest wall. Occasional cough.  Musculoskeletal:  Normal range of motion.  Skin: warm and dry. not jaundiced.  Neurological: No focal deficit present. alert. Oriented. Psychiatric: Mood and Affect congruent.    Condition at discharge: stable  The results of significant diagnostics from this hospitalization (including imaging, microbiology, ancillary and laboratory) are listed below for reference.   Imaging Studies: CT Head Wo Contrast Result Date: 03/04/2024 CLINICAL DATA:  Memory loss EXAM: CT HEAD WITHOUT CONTRAST TECHNIQUE: Contiguous axial images were obtained from the base of the skull through the vertex without intravenous contrast. RADIATION DOSE REDUCTION: This exam was performed according to the departmental dose-optimization program which  includes automated exposure control, adjustment of the mA and/or kV according to patient size and/or use of iterative reconstruction technique. COMPARISON:  CT brain 12/09/2023, MRI 01/06/2024 FINDINGS: Brain: No acute territorial infarction, hemorrhage or intracranial mass. Atrophy and chronic small vessel ischemic changes of the white matter. Chronic right ganglial capsular infarct. Stable ventricle size Vascular: No hyperdense vessels.  Carotid vascular calcification Skull: Normal. Negative for fracture or focal lesion. Sinuses/Orbits: Mucosal thickening in the sinuses Other: None IMPRESSION: No CT evidence for acute intracranial abnormality. Atrophy and chronic small vessel ischemic changes of the white matter Electronically Signed   By: Jasmine Pang M.D.   On: 03/04/2024 19:56   DG Chest Portable 1 View Result Date: 03/04/2024 CLINICAL DATA:  Productive cough. Fever. Altered mental status. Tachycardia. EXAM: PORTABLE CHEST 1 VIEW COMPARISON:  01/20/2024 FINDINGS: Normal sized heart. Tortuous and partially calcified thoracic aorta. Stable biapical pleural and parenchymal scarring interstitial fibrosis in the upper lobes. The remainder of the lungs remain clear. No pleural fluid. Bilateral shoulder prostheses. Bilateral lower neck surgical clips IMPRESSION: 1. No acute abnormality. 2. Stable biapical pleural and parenchymal scarring and interstitial fibrosis in the upper lobes. 3. Aortic atherosclerosis. Electronically Signed   By: Beckie Salts M.D.   On: 03/04/2024 18:47    Microbiology: Results for orders placed or performed during the hospital encounter of 03/04/24  Blood Culture (routine x 2)     Status: None   Collection Time: 03/04/24  4:30 PM   Specimen: BLOOD LEFT FOREARM  Result Value Ref Range Status   Specimen Description BLOOD LEFT FOREARM BOTTLES DRAWN AEROBIC ONLY  Final   Special Requests Blood Culture adequate volume  Final   Culture   Final    NO GROWTH 5 DAYS Performed at [facility], [address] [place], [place] [address]    Report Status 03/09/2024 FINAL  Final  Resp panel by RT-PCR (RSV, Flu A&B, Covid) Anterior Nasal Swab     Status: Abnormal   Collection Time: 03/04/24  4:38 PM   Specimen: Anterior Nasal Swab  Result Value Ref Range Status   SARS Coronavirus 2 by RT PCR NEGATIVE NEGATIVE Final    Comment: (NOTE) SARS-CoV-2 target nucleic acids are NOT DETECTED.  The SARS-CoV-2 RNA is generally detectable in upper respiratory specimens during the acute phase of infection. The lowest concentration of SARS-CoV-2 viral copies this assay can detect is 138 copies/mL. A negative result does not preclude SARS-Cov-2 infection and should not be used as the sole basis for treatment or other patient management decisions. A negative result may occur with  improper specimen collection/handling, submission of specimen other than  nasopharyngeal swab, presence of viral mutation(s) within the areas targeted by this assay, and inadequate number of viral copies(<138 copies/mL). A negative result must be combined with clinical observations, patient history, and epidemiological information. The expected result is Negative.  Fact Sheet for Patients:  BloggerCourse.com  Fact Sheet for Healthcare Providers:  SeriousBroker.it  This test is no t yet approved or cleared by the [place] FDA and  has been authorized for detection and/or diagnosis of SARS-CoV-2 by FDA under an Emergency Use Authorization (EUA). This EUA will remain  in effect (meaning this test can be used) for the duration of the COVID-19 declaration under Section 564(b)(1) of the Act, 21 U.S.C.section 360bbb-3(b)(1), unless the authorization is terminated  or revoked sooner.       Influenza A by PCR POSITIVE (A) NEGATIVE Final   Influenza B by PCR NEGATIVE NEGATIVE Final    Comment: (NOTE) The Xpert Xpress SARS-CoV-2/FLU/RSV plus assay is intended  as an aid in the diagnosis of influenza from Nasopharyngeal swab specimens and should not be used as a sole basis for treatment. Nasal washings and aspirates are unacceptable for Xpert Xpress SARS-CoV-2/FLU/RSV testing.  Fact Sheet for Patients: BloggerCourse.com  Fact Sheet for Healthcare Providers: SeriousBroker.it  This test is not yet approved or cleared by the [place] FDA and has been authorized for detection and/or diagnosis of SARS-CoV-2 by FDA under an Emergency Use Authorization (EUA). This EUA will remain in effect (meaning this test can be used) for the duration of the COVID-19 declaration under Section 564(b)(1) of the Act, 21 U.S.C. section 360bbb-3(b)(1), unless the authorization is terminated or revoked.     Resp Syncytial Virus by PCR NEGATIVE NEGATIVE Final    Comment: (NOTE) Fact Sheet for Patients: BloggerCourse.com  Fact Sheet for Healthcare Providers: SeriousBroker.it  This test is not yet approved or cleared by the [place] FDA and has been authorized for detection and/or diagnosis of SARS-CoV-2 by FDA under an Emergency Use Authorization (EUA). This EUA will remain in effect (meaning this test can be used) for the duration of the COVID-19 declaration under Section 564(b)(1) of the Act, 21 U.S.C. section 360bbb-3(b)(1), unless the authorization is terminated or revoked.  Performed at [facility], [address] [place], [place] [address]   Blood Culture (routine x 2)     Status: None   Collection Time: 03/04/24  5:28 PM   Specimen: BLOOD  Result Value Ref Range Status   Specimen Description BLOOD BLOOD LEFT HAND BOTTLES DRAWN AEROBIC ONLY  Final   Special Requests   Final    Blood Culture results may not be optimal due to an inadequate volume of blood received in culture bottles   Culture   Final    NO GROWTH 5 DAYS Performed at [facility], [address], [place], [place] [address]    Report Status 03/09/2024 FINAL  Final  Urine Culture     Status: Abnormal   Collection Time: 03/04/24  8:48 PM   Specimen: Urine, Random  Result Value Ref Range Status   Specimen Description   Final    URINE, RANDOM Performed at [facility], [address] [place], [place] [address]    Special Requests   Final    NONE Reflexed from [id number] Performed at [facility], [address], [place], [place] [address]    Culture 30,000 COLONIES/mL STAPHYLOCOCCUS EPIDERMIDIS (A)  Final   Report Status 03/07/2024 FINAL  Final   Organism ID, Bacteria STAPHYLOCOCCUS EPIDERMIDIS (A)  Final  Susceptibility   Staphylococcus epidermidis - MIC*    CIPROFLOXACIN >=8 RESISTANT Resistant     GENTAMICIN <=0.5 SENSITIVE Sensitive     NITROFURANTOIN <=16 SENSITIVE Sensitive     OXACILLIN >=4 RESISTANT Resistant     TETRACYCLINE 2 SENSITIVE Sensitive     VANCOMYCIN 1 SENSITIVE Sensitive     TRIMETH/SULFA <=10 SENSITIVE Sensitive     RIFAMPIN <=0.5 SENSITIVE Sensitive     Inducible Clindamycin NEGATIVE Sensitive     * 30,000 COLONIES/mL STAPHYLOCOCCUS EPIDERMIDIS    Labs: CBC: Recent Labs  Lab 03/04/24 1630 03/05/24 0550 03/06/24 0321 03/07/24 0631 03/08/24 0353 03/09/24 0353  WBC 6.3 4.0 5.2 10.3 8.2 6.7  NEUTROABS 4.1  --  3.2 7.1 5.6 4.4  HGB 9.8* 10.1* 11.7* 10.7* 11.8* 10.6*  HCT 30.5* 31.9* 36.1 32.1* 36.7 32.8*  MCV 91.9 92.5 90.0 87.0 90.6 90.9  PLT 127* 112* 111* 116* 121* 113*   Basic Metabolic Panel: Recent Labs  Lab 03/05/24 0550 03/06/24 0321 03/07/24 0631 03/08/24 0353 03/09/24 0353  NA 135 130* 128* 134* 132*  K 3.6 3.5 3.1* 4.1 3.2*  CL 100 96* 98 99 98  CO2 26 26 25 25 26   GLUCOSE 100* 91 101* 95 100*  BUN 14 16 25* 26* 22  CREATININE 0.95 0.87 1.09* 0.96 0.91  CALCIUM 8.5* 8.5* 8.0* 8.6* 8.4*  MG 1.5* 1.7 1.6* 1.9 1.7  PHOS 3.1 2.9 2.8 2.5 2.6   Liver Function Tests: Recent Labs  Lab 03/05/24 0550  03/06/24 0321 03/07/24 0631 03/08/24 0353 03/09/24 0353  AST 24 36 39 42* 38  ALT 12 14 17 18 17   ALKPHOS 54 52 49 52 45  BILITOT 0.4 0.6 0.3 0.3 0.4  PROT 6.8 7.0 6.1* 6.5 6.2*  ALBUMIN 3.1* 3.4* 3.0* 3.0* 2.7*   CBG: No results for input(s): "GLUCAP" in the last 168 hours.  Discharge time spent: 31 minutes.  Signed: Debarah Crape, DO [facility] 03/09/2024

## 2024-03-09 NOTE — Hospital Course (Signed)
 Christine Leblanc is 89 y.o. female with HTN, MGUS, COPD, questionable seizures, who presented to the ED via EMS due to altered mental status.  Over the week prior to hospitalization she has had increased coughing, and altered mentation.  In the ED she was found to be febrile, BP 180/90, maintaining O2 sats on room air.  Workup revealed anemia, thrombocytopenia, hypokalemia, she tested positive for influenza A.  CT head was unremarkable.  Chest x-ray without acute abnormality.  She did receive ceftriaxone and azithromycin under the presumption that she had pneumonia, however chest x-ray was clear, and procalcitonin unremarkable so antibiotics were further discontinued.  Patient was admitted for treatment.  Ultimately her metabolic encephalopathy was thought to be secondary to influenza.  With supportive treatment she gradually returned to baseline.  Stay was prolonged due to supplemental oxygen needs.  Her hypoxia was driven by poor respiratory effort due to generalized fatigue and deconditioning. By 3/10 patient had returned to her physiologic baseline and was endorsing readiness to go home.  Home health PT/OT were arranged prior to discharge.  She will need to follow-up with her primary care physician for chronic medication management.  Plan was discussed with the patient as well as with her daughter who is at bedside.  Influenza A Acute hypoxic respiratory failure has been ruled out, hypoxemia only  - Suspect her supplemental O2 needs are secondary to poor respiratory effort from generalized fatigue/deconditioning.   - Reiterated the importance of incentive spirometry and flutter valve.  Remains high risk for superimposed bacterial pneumonia though no evidence of such at this time - Has weaned to room air. - Symptomatic support with Mucinex, Robitussin, as needed DuoNebs - Tylenol as needed - No evidence of superimposed bacterial pneumonia on CXR, procal neg. No need for antibiotics at this time - No  Tamiflu given onset of illness over 5 days prior to arrival   Acute metabolic encephalopathy, resolved - Secondary to influenza - Patient is near her mental status baseline now. - Continue frequent reorientation - Delirium precautions - Head CT on arrival without acute changes, consistent with atrophy and small vessel ischemic changes.  Labs within normal limits. - Urine culture growing Staph epidermidis, suspect that this is contaminant.  No leuks or nitrites on UA.  Patient denies dysuria/suprapubic pain.   Dehydration - s/p IVF -- Cont PO hydration now   Hypokalemia - Replace as needed   Hypoalbuminemia - Continue protein supplementation   Dysphagia - Patient is on a pured diet outpatient   Thrombocytopenia - Likely reactive due to influenza, further complicated by MGUS as above - Will trend CBC - No occult signs of bleeding at this time   COPD - Not currently in exacerbation - Continue DuoNebs - I-S and FV as above   MGUS - Follows outpatient with oncology, currently stable   Hypertension - Does not appear to be on any antihypertensives outpatient - SBP goal under 150 - As needed hydralazine and labetalol   History of TIA - Continue aspirin and statin (pt no longer takes plavix)   Acquired hypothyroidism - Continue Synthroid   Constipation - Refuses miralax, request fleets enema   Hyponatremia - Likely hypovolemic. Resolving. -- Trend CMP outpatient   CKD stage IIIb - Creatinine baseline around 1.0, currently stable - Renally dose with a creatinine clearance of  29 when needed - Avoid nephrotoxic meds   Normocytic anemia, chronic - Secondary to CKD and iron deficiency per workup with hematology outpatient - Trend hemoglobin. Currently  stable.   Seizure disorder - Remote history of seizures, none in 30 years.  Was previously seen in January for concern of seizure but determined to have TIA.  Not on AEDs at home.

## 2024-03-09 NOTE — ED Triage Notes (Signed)
 Pt was released from this hospital this morning, pt bib son from home with c/o blood blister to pinky toe on right foot, pt says "her dog found it", no break in skin integrity, no drainage. Dark purple in color, here for further evaluation.

## 2024-03-10 ENCOUNTER — Encounter: Payer: Self-pay | Admitting: Hematology

## 2024-03-10 DIAGNOSIS — S90221A Contusion of right lesser toe(s) with damage to nail, initial encounter: Secondary | ICD-10-CM | POA: Diagnosis not present

## 2024-03-10 MED ORDER — AZITHROMYCIN 250 MG PO TABS: 250.0000 mg | ORAL_TABLET | Freq: Every day | ORAL | 0 refills | Status: DC

## 2024-03-10 MED ORDER — AZITHROMYCIN 250 MG PO TABS
500.0000 mg | ORAL_TABLET | Freq: Once | ORAL | Status: AC
  Administered 2024-03-10: 500 mg via ORAL
  Filled 2024-03-10: qty 2

## 2024-03-10 NOTE — ED Notes (Signed)
 ED Provider at bedside.

## 2024-03-10 NOTE — Discharge Instructions (Signed)
 Begin taking Zithromax as prescribed.  Follow-up with primary doctor if not improving in the next few days, and return to the ER if symptoms significantly worsen or change.

## 2024-03-10 NOTE — ED Provider Notes (Addendum)
 [facility] Provider Note   CSN: [id number] Arrival date & time: 03/09/24  2100     History  Chief Complaint  Patient presents with   Wound Check    Right foot pinky toe- blood blister     Christine Leblanc is a 89 y.o. female.  Patient is a 89 year old female with past medical history of hypertension, GERD, hypothyroidism, TIA, COPD.  Patient with recent admission for influenza.  She was discharged this morning.  Shortly after arriving home, the family noted a dark spot to her right fifth toe.  There is no reported injury or trauma.  Family member at bedside concerned about gangrene.  Patient also complains of cough that has been persistent for several weeks.  It is intermittently productive and she feels congested in her chest.  No aggravating or alleviating factors.  She was just admitted for influenza.  The history is provided by the patient.       Home Medications Prior to Admission medications   Medication Sig Start Date End Date Taking? Authorizing Provider  aspirin EC 81 MG tablet Take 1 tablet (81 mg total) by mouth daily. Swallow whole. 01/08/24 04/07/24  Sherryll Burger, Pratik D, DO  atorvastatin (LIPITOR) 40 MG tablet Take 1 tablet (40 mg total) by mouth at bedtime. Patient not taking: Reported on 03/02/2024 01/08/24   Maurilio Lovely D, DO  Carbinoxamine Maleate 4 MG TABS Take 1 tablet (4 mg total) by mouth at bedtime. 01/02/23 02/06/24  Alfonse Spruce, MD  diazepam (VALIUM) 5 MG tablet Take 2.5 mg by mouth daily as needed for anxiety. 05/29/23   [provider]  ferrous sulfate 325 (65 FE) MG tablet Take 325 mg by mouth. Takes one on Monday, Wednesday and Friday    [provider]  fluticasone (FLONASE) 50 MCG/ACT nasal spray Place 2 sprays into both nostrils daily. 12/02/23   Birder Robson, MD  ipratropium (ATROVENT) 0.06 % nasal spray Place 1 spray into both nostrils 4 (four) times daily as needed for rhinitis. 12/02/23    Birder Robson, MD  levothyroxine (SYNTHROID) 88 MCG tablet TAKE 1 TABLET DAILY ON MONDAY THROUGH SATURDAY AND ONE-HALF (1/2) TABLET ON SUNDAY Patient taking differently: Take 88 mcg by mouth See admin instructions. TAKE 1 TABLET DAILY ON MONDAY THROUGH SATURDAY AND ONE-HALF (1/2) TABLET ON SUNDAY 10/10/22   Reather Littler, MD  pantoprazole (PROTONIX) 40 MG tablet Take 1 tablet (40 mg total) by mouth 2 (two) times daily before a meal. 11/06/23   Letta Median, PA-C  senna (SENOKOT) 8.6 MG tablet Take 2 tablets by mouth at bedtime.    [provider]      Allergies    Celecoxib, Dexlansoprazole, and Famotidine    Review of Systems   Review of Systems  All other systems reviewed and are negative.   Physical Exam Updated Vital Signs BP (!) 149/96 (BP Location: Right Arm)   Pulse 89   Temp 98.7 F (37.1 C) (Oral)   Resp 20   Ht 5\' 1"  (1.549 m)   Wt 48.1 kg   SpO2 92%   BMI 20.03 kg/m  Physical Exam Vitals and nursing note reviewed.  Constitutional:      General: She is not in acute distress.    Appearance: She is well-developed. She is not diaphoretic.  HENT:     Head: Normocephalic and atraumatic.  Cardiovascular:     Rate and Rhythm: Normal rate and regular rhythm.  Heart sounds: No murmur heard.    No friction rub. No gallop.  Pulmonary:     Effort: Pulmonary effort is normal. No respiratory distress.     Breath sounds: Normal breath sounds. No wheezing.  Abdominal:     General: Bowel sounds are normal. There is no distension.     Palpations: Abdomen is soft.     Tenderness: There is no abdominal tenderness.  Musculoskeletal:        General: Normal range of motion.     Cervical back: Normal range of motion and neck supple.     Comments: To the right fifth toe, there is a subungual hematoma with loosening of the nail from the nailbed.  Skin:    General: Skin is warm and dry.  Neurological:     General: No focal deficit present.     Mental Status: She is  alert and oriented to person, place, and time.       ED Results / Procedures / Treatments   Labs (all labs ordered are listed, but only abnormal results are displayed) Labs Reviewed - No data to display  EKG None  Radiology No results found.  Procedures Procedures    Medications Ordered in ED Medications - No data to display  ED Course/ Medical Decision Making/ A&P  Patient is a 89 year old female with past medical history as per HPI presenting with complaints of a dark-colored toe and cough as described in the HPI.  Patient arrives here with stable vital signs and is afebrile.  There is no hypoxia and physical examination basically unremarkable except for the findings of the toe as described in the HPI.  Patient just discharged yesterday morning and I do not feel as though any repeat laboratory studies or imaging studies are indicated.  It appears as though she has a nail that has been dislodged from the nailbed and has a subungual hematoma.  I do not feel as though this is gangrenous or necrotic and does not require any treatment.  I will treat the cough with antibiotics and see if this helps.  She reports that this has been ongoing for several weeks.  She did test positive recently for influenza A, but perhaps she has some sort of superinfection and this is why her illness is lingering.  Final Clinical Impression(s) / ED Diagnoses Final diagnoses:  None    Rx / DC Orders ED Discharge Orders     None         Geoffery Lyons, MD 03/10/24 Estrella Myrtle    Geoffery Lyons, MD 03/10/24 1610

## 2024-03-12 ENCOUNTER — Encounter: Payer: Self-pay | Admitting: Hematology

## 2024-03-14 DIAGNOSIS — M47812 Spondylosis without myelopathy or radiculopathy, cervical region: Secondary | ICD-10-CM | POA: Diagnosis not present

## 2024-03-14 DIAGNOSIS — E890 Postprocedural hypothyroidism: Secondary | ICD-10-CM | POA: Diagnosis not present

## 2024-03-14 DIAGNOSIS — E785 Hyperlipidemia, unspecified: Secondary | ICD-10-CM | POA: Diagnosis not present

## 2024-03-14 DIAGNOSIS — G8929 Other chronic pain: Secondary | ICD-10-CM | POA: Diagnosis not present

## 2024-03-14 DIAGNOSIS — E46 Unspecified protein-calorie malnutrition: Secondary | ICD-10-CM | POA: Diagnosis not present

## 2024-03-14 DIAGNOSIS — M19042 Primary osteoarthritis, left hand: Secondary | ICD-10-CM | POA: Diagnosis not present

## 2024-03-14 DIAGNOSIS — M19012 Primary osteoarthritis, left shoulder: Secondary | ICD-10-CM | POA: Diagnosis not present

## 2024-03-14 DIAGNOSIS — M549 Dorsalgia, unspecified: Secondary | ICD-10-CM | POA: Diagnosis not present

## 2024-03-14 DIAGNOSIS — D630 Anemia in neoplastic disease: Secondary | ICD-10-CM | POA: Diagnosis not present

## 2024-03-14 DIAGNOSIS — D631 Anemia in chronic kidney disease: Secondary | ICD-10-CM | POA: Diagnosis not present

## 2024-03-14 DIAGNOSIS — S90121D Contusion of right lesser toe(s) without damage to nail, subsequent encounter: Secondary | ICD-10-CM | POA: Diagnosis not present

## 2024-03-14 DIAGNOSIS — D696 Thrombocytopenia, unspecified: Secondary | ICD-10-CM | POA: Diagnosis not present

## 2024-03-14 DIAGNOSIS — E860 Dehydration: Secondary | ICD-10-CM | POA: Diagnosis not present

## 2024-03-14 DIAGNOSIS — I700 Atherosclerosis of aorta: Secondary | ICD-10-CM | POA: Diagnosis not present

## 2024-03-14 DIAGNOSIS — I129 Hypertensive chronic kidney disease with stage 1 through stage 4 chronic kidney disease, or unspecified chronic kidney disease: Secondary | ICD-10-CM | POA: Diagnosis not present

## 2024-03-14 DIAGNOSIS — R1319 Other dysphagia: Secondary | ICD-10-CM | POA: Diagnosis not present

## 2024-03-14 DIAGNOSIS — D472 Monoclonal gammopathy: Secondary | ICD-10-CM | POA: Diagnosis not present

## 2024-03-14 DIAGNOSIS — E876 Hypokalemia: Secondary | ICD-10-CM | POA: Diagnosis not present

## 2024-03-14 DIAGNOSIS — N1832 Chronic kidney disease, stage 3b: Secondary | ICD-10-CM | POA: Diagnosis not present

## 2024-03-14 DIAGNOSIS — M19041 Primary osteoarthritis, right hand: Secondary | ICD-10-CM | POA: Diagnosis not present

## 2024-03-14 DIAGNOSIS — E8809 Other disorders of plasma-protein metabolism, not elsewhere classified: Secondary | ICD-10-CM | POA: Diagnosis not present

## 2024-03-14 DIAGNOSIS — M1712 Unilateral primary osteoarthritis, left knee: Secondary | ICD-10-CM | POA: Diagnosis not present

## 2024-03-14 DIAGNOSIS — J101 Influenza due to other identified influenza virus with other respiratory manifestations: Secondary | ICD-10-CM | POA: Diagnosis not present

## 2024-03-14 DIAGNOSIS — G40909 Epilepsy, unspecified, not intractable, without status epilepticus: Secondary | ICD-10-CM | POA: Diagnosis not present

## 2024-03-14 DIAGNOSIS — J4489 Other specified chronic obstructive pulmonary disease: Secondary | ICD-10-CM | POA: Diagnosis not present

## 2024-03-16 ENCOUNTER — Other Ambulatory Visit (HOSPITAL_COMMUNITY): Payer: Self-pay | Admitting: Family Medicine

## 2024-03-16 ENCOUNTER — Ambulatory Visit (HOSPITAL_COMMUNITY)
Admission: RE | Admit: 2024-03-16 | Discharge: 2024-03-16 | Disposition: A | Discharge status: Home / Self Care | Source: Ambulatory Visit | Attending: Family Medicine | Admitting: Family Medicine

## 2024-03-16 DIAGNOSIS — S90424A Blister (nonthermal), right lesser toe(s), initial encounter: Secondary | ICD-10-CM | POA: Diagnosis not present

## 2024-03-16 DIAGNOSIS — R059 Cough, unspecified: Secondary | ICD-10-CM | POA: Diagnosis not present

## 2024-03-16 DIAGNOSIS — E871 Hypo-osmolality and hyponatremia: Secondary | ICD-10-CM | POA: Diagnosis not present

## 2024-03-16 DIAGNOSIS — Z96612 Presence of left artificial shoulder joint: Secondary | ICD-10-CM | POA: Diagnosis not present

## 2024-03-16 DIAGNOSIS — R0600 Dyspnea, unspecified: Secondary | ICD-10-CM | POA: Diagnosis not present

## 2024-03-16 DIAGNOSIS — I700 Atherosclerosis of aorta: Secondary | ICD-10-CM | POA: Diagnosis not present

## 2024-03-16 DIAGNOSIS — Z96611 Presence of right artificial shoulder joint: Secondary | ICD-10-CM | POA: Diagnosis not present

## 2024-03-16 DIAGNOSIS — J09X2 Influenza due to identified novel influenza A virus with other respiratory manifestations: Secondary | ICD-10-CM | POA: Diagnosis not present

## 2024-03-16 DIAGNOSIS — R030 Elevated blood-pressure reading, without diagnosis of hypertension: Secondary | ICD-10-CM | POA: Diagnosis not present

## 2024-03-16 DIAGNOSIS — J101 Influenza due to other identified influenza virus with other respiratory manifestations: Secondary | ICD-10-CM | POA: Diagnosis not present

## 2024-03-16 DIAGNOSIS — E876 Hypokalemia: Secondary | ICD-10-CM | POA: Diagnosis not present

## 2024-03-17 DIAGNOSIS — D472 Monoclonal gammopathy: Secondary | ICD-10-CM | POA: Diagnosis not present

## 2024-03-17 DIAGNOSIS — I700 Atherosclerosis of aorta: Secondary | ICD-10-CM | POA: Diagnosis not present

## 2024-03-17 DIAGNOSIS — J101 Influenza due to other identified influenza virus with other respiratory manifestations: Secondary | ICD-10-CM | POA: Diagnosis not present

## 2024-03-17 DIAGNOSIS — J4489 Other specified chronic obstructive pulmonary disease: Secondary | ICD-10-CM | POA: Diagnosis not present

## 2024-03-17 DIAGNOSIS — S90121D Contusion of right lesser toe(s) without damage to nail, subsequent encounter: Secondary | ICD-10-CM | POA: Diagnosis not present

## 2024-03-17 DIAGNOSIS — D630 Anemia in neoplastic disease: Secondary | ICD-10-CM | POA: Diagnosis not present

## 2024-03-18 DIAGNOSIS — J4489 Other specified chronic obstructive pulmonary disease: Secondary | ICD-10-CM | POA: Diagnosis not present

## 2024-03-18 DIAGNOSIS — J101 Influenza due to other identified influenza virus with other respiratory manifestations: Secondary | ICD-10-CM | POA: Diagnosis not present

## 2024-03-18 DIAGNOSIS — S90121D Contusion of right lesser toe(s) without damage to nail, subsequent encounter: Secondary | ICD-10-CM | POA: Diagnosis not present

## 2024-03-18 DIAGNOSIS — D630 Anemia in neoplastic disease: Secondary | ICD-10-CM | POA: Diagnosis not present

## 2024-03-18 DIAGNOSIS — D472 Monoclonal gammopathy: Secondary | ICD-10-CM | POA: Diagnosis not present

## 2024-03-18 DIAGNOSIS — I700 Atherosclerosis of aorta: Secondary | ICD-10-CM | POA: Diagnosis not present

## 2024-03-19 ENCOUNTER — Telehealth: Payer: Self-pay | Admitting: *Deleted

## 2024-03-19 NOTE — Telephone Encounter (Signed)
 Patient son called in and left VM that patient needed a refill on her medication that she lost.  Called spoke with Son. He was not sure what she needed a refill on. I asked if she has her miralax and he stated she did. I asked if she had her pantoprazole taking BID and he stated she was. I advised it looked like that was all we prescribed for her. He voiced understanding.

## 2024-03-20 DIAGNOSIS — J4489 Other specified chronic obstructive pulmonary disease: Secondary | ICD-10-CM | POA: Diagnosis not present

## 2024-03-20 DIAGNOSIS — I700 Atherosclerosis of aorta: Secondary | ICD-10-CM | POA: Diagnosis not present

## 2024-03-20 DIAGNOSIS — S90121D Contusion of right lesser toe(s) without damage to nail, subsequent encounter: Secondary | ICD-10-CM | POA: Diagnosis not present

## 2024-03-20 DIAGNOSIS — D630 Anemia in neoplastic disease: Secondary | ICD-10-CM | POA: Diagnosis not present

## 2024-03-20 DIAGNOSIS — D472 Monoclonal gammopathy: Secondary | ICD-10-CM | POA: Diagnosis not present

## 2024-03-20 DIAGNOSIS — J101 Influenza due to other identified influenza virus with other respiratory manifestations: Secondary | ICD-10-CM | POA: Diagnosis not present

## 2024-03-23 ENCOUNTER — Ambulatory Visit: Admitting: Podiatry

## 2024-03-26 DIAGNOSIS — J101 Influenza due to other identified influenza virus with other respiratory manifestations: Secondary | ICD-10-CM | POA: Diagnosis not present

## 2024-03-26 DIAGNOSIS — J4489 Other specified chronic obstructive pulmonary disease: Secondary | ICD-10-CM | POA: Diagnosis not present

## 2024-03-26 DIAGNOSIS — D472 Monoclonal gammopathy: Secondary | ICD-10-CM | POA: Diagnosis not present

## 2024-03-26 DIAGNOSIS — D630 Anemia in neoplastic disease: Secondary | ICD-10-CM | POA: Diagnosis not present

## 2024-03-26 DIAGNOSIS — S90121D Contusion of right lesser toe(s) without damage to nail, subsequent encounter: Secondary | ICD-10-CM | POA: Diagnosis not present

## 2024-03-26 DIAGNOSIS — I700 Atherosclerosis of aorta: Secondary | ICD-10-CM | POA: Diagnosis not present

## 2024-03-30 DIAGNOSIS — I700 Atherosclerosis of aorta: Secondary | ICD-10-CM | POA: Diagnosis not present

## 2024-03-30 DIAGNOSIS — J4489 Other specified chronic obstructive pulmonary disease: Secondary | ICD-10-CM | POA: Diagnosis not present

## 2024-03-30 DIAGNOSIS — D472 Monoclonal gammopathy: Secondary | ICD-10-CM | POA: Diagnosis not present

## 2024-03-30 DIAGNOSIS — J101 Influenza due to other identified influenza virus with other respiratory manifestations: Secondary | ICD-10-CM | POA: Diagnosis not present

## 2024-03-30 DIAGNOSIS — S90121D Contusion of right lesser toe(s) without damage to nail, subsequent encounter: Secondary | ICD-10-CM | POA: Diagnosis not present

## 2024-03-30 DIAGNOSIS — D630 Anemia in neoplastic disease: Secondary | ICD-10-CM | POA: Diagnosis not present

## 2024-03-31 ENCOUNTER — Ambulatory Visit (INDEPENDENT_AMBULATORY_CARE_PROVIDER_SITE_OTHER): Payer: Medicare Other | Admitting: Neurology

## 2024-03-31 ENCOUNTER — Encounter: Payer: Self-pay | Admitting: Neurology

## 2024-03-31 VITALS — BP 150/80 | HR 86

## 2024-03-31 DIAGNOSIS — F0390 Unspecified dementia without behavioral disturbance: Secondary | ICD-10-CM | POA: Diagnosis not present

## 2024-03-31 DIAGNOSIS — G459 Transient cerebral ischemic attack, unspecified: Secondary | ICD-10-CM | POA: Diagnosis not present

## 2024-03-31 DIAGNOSIS — R4182 Altered mental status, unspecified: Secondary | ICD-10-CM | POA: Diagnosis not present

## 2024-03-31 NOTE — Progress Notes (Unsigned)
 [facility]    Provider:  Dr Lucia Gaskins Referring Provider: Donette Larry Primary Care Physician:  Benita Stabile, MD  CC:  TIA  HPI: Here with son, this is a lovely 89 year old with dementia here for follow up after the hospital Here with son, we reviewed inpatient course with recently diagnosed TIA; MRi of the brain showed no acute finding, MRA of the head with multifocal stenoses, son agrees at this time we will not order anymore follow up ir any interventions, continue current therapy, fall precautions, concentrate on quality of remaining life.. has Other dysphagia; Leg pain, bilateral; Dyslipidemia; Essential hypertension; Gastroesophageal reflux disease; Anemia, normocytic normochromic; Syncope and collapse; Dehydration; Acquired hypothyroidism; Postsurgical hypothyroidism; Osteoarthritis of right knee; Oropharyngeal dysphagia; Primary localized osteoarthrosis of right shoulder; S/P shoulder replacement; MGUS (monoclonal gammopathy of unknown significance); Generalized weakness; Hypokalemia; History of TIA (transient ischemic attack); Mild carotid artery disease (HCC); Loss of weight; Pharyngeal dysphagia; Protein-calorie malnutrition, mild (HCC); Acute bronchitis; Iron deficiency anemia; COPD (chronic obstructive pulmonary disease) (HCC); Dysphagia; Chronic cough; SIRS (systemic inflammatory response syndrome) (HCC); CAP (community acquired pneumonia); TIA (transient ischemic attack); Influenza A; Hypoalbuminemia due to protein-calorie malnutrition (HCC); and Encephalopathy on their problem list.  She presented on 01/06/2024 to the ED with altered mental status, confusion, headaches, she was witnessed "speaking gibberish", alert and oriented upon ER arrival and at baseline. She was started on DUAP and in 3 weeks recommended to stay on Plavix but family has her on ASA 81mg  they decline any medication changes.   Reviewed notes, labs and imaging from outside physicians, which  showed:   03/04/2024:  CLINICAL DATA:  Memory loss   EXAM: CT HEAD WITHOUT CONTRAST   TECHNIQUE: Contiguous axial images were obtained from the base of the skull through the vertex without intravenous contrast.   RADIATION DOSE REDUCTION: This exam was performed according to the departmental dose-optimization program which includes automated exposure control, adjustment of the mA and/or kV according to patient size and/or use of iterative reconstruction technique.   COMPARISON:  CT brain 12/09/2023, MRI 01/06/2024   FINDINGS: Brain: No acute territorial infarction, hemorrhage or intracranial mass. Atrophy and chronic small vessel ischemic changes of the white matter. Chronic right ganglial capsular infarct. Stable ventricle size   Vascular: No hyperdense vessels.  Carotid vascular calcification   Skull: Normal. Negative for fracture or focal lesion.   Sinuses/Orbits: Mucosal thickening in the sinuses   Other: None   IMPRESSION: No CT evidence for acute intracranial abnormality. Atrophy and chronic small vessel ischemic changes of the white matter  MRI HEAD WITHOUT CONTRAST   TECHNIQUE: Multiplanar, multiecho pulse sequences of the brain and surrounding structures were obtained without intravenous contrast.   COMPARISON:  Head CT 12/09/2023.  MRI 07/09/2016.   FINDINGS: Brain: Diffusion imaging does not show any acute or subacute infarction or other cause of restricted diffusion. Chronic small-vessel ischemic changes are present throughout the pons. No focal cerebellar finding. Cerebral hemispheres show an old lacunar infarction in the right basal ganglia/external capsule. Chronic small-vessel ischemic changes are present within the thalami, basal ganglia and throughout the cerebral hemispheric deep white matter. No large vessel territory stroke. No mass lesion, recent hemorrhage, hydrocephalus or extra-axial collection. Some hemosiderin deposition associated with  the small vessel insults in the right basal ganglia region.   Vascular: Major vessels at the base of the brain show flow.   Skull and upper cervical spine: Negative   Sinuses/Orbits: Mild mucosal thickening. No advanced sinusitis. Orbits  negative.   Other: None   IMPRESSION: No acute or reversible finding. Chronic small-vessel ischemic changes throughout the pons, thalami, basal ganglia and cerebral hemispheric deep white matter. Old lacunar infarction in the right basal ganglia/external capsule.  01/07/2024: CLINICAL DATA:  Stroke, follow-up   EXAM: MRA HEAD WITHOUT CONTRAST   TECHNIQUE: Angiographic images of the Circle of Willis were acquired using MRA technique without intravenous contrast.   COMPARISON:  No prior MRA a available, correlation is made with 01/06/2024 MRI head   FINDINGS: Anterior circulation: Both internal carotid arteries are patent to the termini, with mild stenosis in the distal left supraclinoid ICA (series 2, image 81).   A1 segments patent, with moderate stenosis in the distal right A1 (series 2, image 94). Normal anterior communicating artery. Anterior cerebral arteries are patent to their distal aspects without significant stenosis.   Moderate stenosis in the right M1 (series 2, image 86). Severe stenosis in the left M1 (series 2, image 103). Distal MCA branches perfused to their distal aspects without significant stenosis.   Posterior circulation: Vertebral arteries patent to the vertebrobasilar junction without stenosis. Posterior inferior cerebral artery is patent on the right, with severe stenosis distally (series 2, image 28). The left is not visualized.   Basilar patent to its distal aspect. Severe stenosis at the origin right superior cerebellar artery. The left superior cerebellar artery is patent proximally.   Patent P1 segments. Multifocal severe stenosis in the proximal left P 2 (series 2, images 83 and 89. Additional severe  stenosis in the bilateral P3 branches (series 2, images 91, 96-97). The bilateral posterior communicating arteries are not visualized.   Anatomic variants: None significant   IMPRESSION: 1. Severe stenosis in the left M1, distal right PICA, at the origin of the right superior cerebellar artery, in the proximal left P2, and bilateral P3 branches. 2. Moderate stenosis in the distal right A1 and right M1. Mild stenosis in the distal left supraclinoid ICA.  Patient complains of symptoms per HPI as well as the following symptoms: none . Pertinent negatives and positives per HPI. All others negative  Interval history 01/20/2019: This is a patient who is here for a new problem, she felt like she passed out and her son helped her to the ground.  She may have passed out 1 month prior.  She was evaluated here in the past for chronic dysphasia and work-up was largely unremarkable including lab testing, MRIs, electromyographic and nerve conduction study testing. She is back today for a different problem.  Patient has an extensive PMHx, monoclonal gammopathy of undetermined significance.  She was recently seen in the emergency room for "blackout spells".  Patient has chronic dysphagia also complains of night sweats, abdominal pain and shortness of breath.  Patient had CT Angie of the chest and CT abdomen and pelvis done September 30, 2018.   CT of the brain done November 04, 2018 showed nothing acute.  No CT evidence for acute abnormality of the neck after CT soft tissue of the neck with contrast.   Patient was seen in the emergency room on 11/04/2018 for a syncopal episode briefly no chest pain or shortness of breath.  She has longstanding weakness and dysphasia.  She was describing orthostatic symptoms.  Blood work revealed chronic anemia.  She was seen again on the 17th for similar symptoms and was diagnosed with a UTI.  She denies any alteration of awareness, seizure-like activity, focal neurologic deficits.  She  says this happened in November  and hasn't happened since then. The episodes are so long ago she can;t remember. She says she woke up and had to go to the bathroom. She made it to the bathroom and the next thing she knew she was on the floor, no one witnessed the event. No bruises, she remembers the episode and remembers hitting her elbow on the ground. She felt alright. She was sitting. She went to the ED and she was dehydrated and anemic and the second ED visit she had a UTI. She also has a problem of constipation. She was feeling weak and dizzy when standing and would have to sit down. None since November.  She had a heart monitor on her. She has no history of seizures or seizure-like activy.     Interval history 05/21/2017. Patient was seen almost a year ago for dysphasia. Workup was largely unremarkable including lab testing and MRI of the brain. She was asked to follow up in 3 months but has not followed up for a year. Her dysphagia is stable, no worsening, in fact possibly improved. She is drooling form time to time. She denies fasciculations. She denies weakness. She denies weight loss. She went to speech therapy last in march this year. No neck pain.   MRi brain 06/2016:  This MRI of the brain without contrast shows the following: 1.    Chronic lacunar infarction involving the right putamen and adjacent posterior limb of the internal capsule. This was noted on CT scan from 08/02/2012 2.    Extensive T2/FLAIR hyperintense foci consistent with advanced chronic microvascular ischemic change.  Only mild changes are noted within the right pons.  A similar extent of hemispheric changes is seen on CT scan from 2013. 3.    Mild cortical atrophy most pronounced in the parietal lobes. 4.     There is asymmetry of signal within the basal ganglia on susceptibility weighted images.    The significance of this finding is unclear and most likely represents asymmetry of iron deposition. It is unlikely to  represent sequela of prior hemorrhage.       HPI:  Christine Leblanc is a 89 y.o. female here as a referral from Dr. Donne Hazel . PMHx of dysphagia, former smoker, total thyroidectomy in 2010, distal esophageal web requiring dilation. Thin liquids are symptomatic. She has Dysphagia requiring mechanical soft nectar thick liquids. Swallowing problems started 40 years ago, she remembers being at her kitchen sink eating smoked sausage and choking. But she hadn't had a problem for decades until  having dentures put in which impairs chewing. She feels her dentures make it difficult to talk. Dysphasia worsening since Christmas. Patient thinks she may have had a stroke in the past. Started at Christmas or longer. Chewing is difficult and it takes her forever to chew since her dentures, she avoids things that are hard to chew. Occ she thinks she swallows something wrong. Main problems is liquids. She has a drippy nose. Sometimes coffee comes out her nose. 20 years ago she thinks maybe she had a stroke, she was at church and she was saying something and she suddenly felt weak and she felt clammy. She went to the doctor and  she thinks she had a stroke but the doctors were not sure. She has never noticed any weakness. The swallowing difficulty is continuous, not dependent on the time of day. If she is upset she is not so careful. No FHx of neuromuscular disorders. No tremor. No diplopia. No ptosis, no  headache. She does not notice any muscle jumping and no recent weight or muscle loss. No changes in voice quality. She has a lot of joint problems, Dr. Dion Saucier replaced shoulder and knee joints. No neck pain. Grandfather had Parkinson's disease. She denies any memory changes. No problems with breathing. No difficulty with walking. No other new symptoms since December.   Reviewed notes, labs and imaging from outside physicians, which showed: TSH normal 4.47 February 2017. CBC in June 2016 showed anemia 9.4 with global  and 27.9 hematocrit in 132 platelets, BMP was unremarkable.   CT of the head 11/2011: Personally reviewed and agree with the following   Findings: Age appropriate atrophy.  Patchy hypodensity in the cerebral white matter bilaterally is unchanged from the  prior study and compatible with chronic microvascular ischemia.  No definite acute infarct.  Negative for hemorrhage or mass lesion. Calvarium is intact.   Chronic sinusitis.   IMPRESSION: Atrophy and chronic microvascular ischemic change.  No acute infarct or hemorrhage.   Review of Systems: Patient complains of symptoms per HPI as well as the following symptoms: Restless legs, memory loss, difficulty swallowing, passing out, decreased energy, feeling cold, allergies, runny nose, weight loss, fatigue, swelling trouble swallowing, constipation pertinent negatives per HPI. All others negative.      Social History   Socioeconomic History   Marital status: Widowed    Spouse name: Not on file   Number of children: 4   Years of education: 10   Highest education level: Not on file  Occupational History   Occupation: Artist    Comment: does not yield regular income   Occupation: Retired    Comment: Engineer, civil (consulting)  Tobacco Use   Smoking status: Former    Current packs/day: 0.00    Average packs/day: 0.8 packs/day for 20.0 years (16.0 ttl pk-yrs)    Types: Cigarettes    Start date: 12/31/1958    Quit date: 12/31/1978    Years since quitting: 45.2   Smokeless tobacco: Never  Vaping Use   Vaping status: Never Used  Substance and Sexual Activity   Alcohol use: Not Currently    Comment: rarely   Drug use: No   Sexual activity: Never    Birth control/protection: Surgical    Comment: widowed since 2010  Other Topics Concern   Not on file  Social History Narrative   ** Merged History Encounter **       Lives at home. Her daughter lives with her.  Caffeine use: 1 cup coffee per day    Social Drivers of Health   Financial Resource Strain:  Not on file  Food Insecurity: No Food Insecurity (03/05/2024)   Hunger Vital Sign    Worried About Running Out of Food in the Last Year: Never true    Ran Out of Food in the Last Year: Never true  Transportation Needs: No Transportation Needs (03/05/2024)   PRAPARE - Administrator, Civil Service (Medical): No    Lack of Transportation (Non-Medical): No  Physical Activity: Not on file  Stress: Not on file  Social Connections: Socially Isolated (03/05/2024)   Social Connection and Isolation Panel [NHANES]    Frequency of Communication with Friends and Family: More than three times a week    Frequency of Social Gatherings with Friends and Family: More than three times a week    Attends Religious Services: Never    Database administrator or Organizations: No    Attends Banker Meetings:  Never    Marital Status: Widowed  Intimate Partner Violence: Not At Risk (03/05/2024)   Humiliation, Afraid, Rape, and Kick questionnaire    Fear of Current or Ex-Partner: No    Emotionally Abused: No    Physically Abused: No    Sexually Abused: No    Family History  Problem Relation Age of Onset   Heart disease Mother    Gallbladder disease Mother    Heart failure Mother    Diabetes Mother    Breast cancer Sister        30 years ago   Diabetes Sister    Heart attack Father    Diabetes Maternal Grandmother    Colon cancer Neg Hx    Stroke Neg Hx     Past Medical History:  Diagnosis Date   Anemia    Asthma    Baker's cyst    Cancer (HCC)    skin Ca- ? basal cell    Chronic back pain    Chronic neck pain    Degenerative joint disease    Left shoulder; cervical spine, knees & hands    Gastroesophageal reflux disease    Hiatal hernia; distal esophageal web requiring dilatation; gastric polyps; gastritis; refuses colonoscopy   GERD (gastroesophageal reflux disease)    History of stress test 1990's   stress test done under the care of Dr. Dietrich Pates & Dr. Allyson Sabal, now being  followed by Dr. Wyline Mood- in [place] , recently seen & told to f/U in one yr.    Hyperlipidemia 04/27/2011   Hypertension    Hypothyroidism    Lymphocytic thyroiditis    Mild carotid artery disease (HCC)    Multiple thyroid nodules 2010   Adenomatous; thyroidectomy in 2010   Orthostasis    Osteoarthritis of right knee 07/27/2014   Pneumonia    hosp. for pneumonia- long time ago    Primary localized osteoarthrosis of right shoulder 06/21/2015   Seizures (HCC)    yes- as a child- & into adult years, states she took med. for them at one time, stopped at 32 yrs. of age    Syncope and collapse 2007   Possible CVA in 2012 with left lower extremity weakness; refused hospitalization; CT-Atrophy and chronic microvascular ischemic change.     Past Surgical History:  Procedure Laterality Date   ABDOMINAL HYSTERECTOMY     fibroids   CATARACT EXTRACTION     Bilateral; redo surgery on the right for incomplete primary procedure   COLONOSCOPY  Remote   ESOPHAGOGASTRODUODENOSCOPY  11/2009   Dr. Darrick Penna: probable distal web s/p dilation small hiatal hernia/gastric polyps/mild gastritis, appeared to have narrowing at the junction of D1 and D2 dilated up to 12 mm   ESOPHAGOGASTRODUODENOSCOPY N/A 03/21/2015   Surgeon: West Bali, MD; proximal esophageal web s/p dilation, multiple gastric polyps s/p multiple biopsies, mild erosive gastritis s/p biopsy.  Pathology with fundic gland polyps, chronic gastritis, negative for H. pylori.   EYE SURGERY     PROLAPSED UTERINE FIBROID LIGATION     outcomed with rectocele & cystocele   SAVORY DILATION N/A 03/21/2015   Procedure: SAVORY DILATION;  Surgeon: West Bali, MD;  Location: [facility];  Service: Endoscopy;  Laterality: N/A;   SHOULDER SURGERY     left   TOTAL KNEE ARTHROPLASTY Right 07/27/2014   Procedure: RIGHT TOTAL KNEE ARTHROPLASTY;  Surgeon: Eulas Post, MD;  Location: [facility] OR;  Service: Orthopedics;  Laterality: Right;   TOTAL  SHOULDER ARTHROPLASTY Right 06/21/2015  Procedure: RIGHT TOTAL SHOULDER ARTHROPLASTY;  Surgeon: Teryl Lucy, MD;  Location: [facility] OR;  Service: Orthopedics;  Laterality: Right;   TOTAL THYROIDECTOMY  2010    Current Outpatient Medications  Medication Sig Dispense Refill   aspirin EC 81 MG tablet Take 1 tablet (81 mg total) by mouth daily. Swallow whole. 90 tablet 0   atorvastatin (LIPITOR) 40 MG tablet Take 1 tablet (40 mg total) by mouth at bedtime. 30 tablet 3   diazepam (VALIUM) 5 MG tablet Take 2.5 mg by mouth daily as needed for anxiety.     ferrous sulfate 325 (65 FE) MG tablet Take 325 mg by mouth. Takes one on Monday, Wednesday and Friday     fluticasone (FLONASE) 50 MCG/ACT nasal spray Place 2 sprays into both nostrils daily. 16 g 5   ipratropium (ATROVENT) 0.06 % nasal spray Place 1 spray into both nostrils 4 (four) times daily as needed for rhinitis. 15 mL 5   levothyroxine (SYNTHROID) 88 MCG tablet TAKE 1 TABLET DAILY ON MONDAY THROUGH SATURDAY AND ONE-HALF (1/2) TABLET ON SUNDAY (Patient taking differently: Take 88 mcg by mouth See admin instructions. TAKE 1 TABLET DAILY ON MONDAY THROUGH SATURDAY AND ONE-HALF (1/2) TABLET ON SUNDAY) 90 tablet 3   pantoprazole (PROTONIX) 40 MG tablet Take 1 tablet (40 mg total) by mouth 2 (two) times daily before a meal. 180 tablet 3   senna (SENOKOT) 8.6 MG tablet Take 2 tablets by mouth at bedtime.     azithromycin (ZITHROMAX) 250 MG tablet Take 1 tablet (250 mg total) by mouth daily. (Patient not taking: Reported on 03/31/2024) 4 tablet 0   Carbinoxamine Maleate 4 MG TABS Take 1 tablet (4 mg total) by mouth at bedtime. 90 tablet 4   No current facility-administered medications for this visit.    Allergies as of 03/31/2024 - Review Complete 03/31/2024  Allergen Reaction Noted   Celecoxib Shortness Of Breath    Dexlansoprazole Anaphylaxis and Other (See Comments) 05/04/2016   Famotidine Other (See Comments) 11/04/2018    Vitals: BP (!)  150/80   Pulse 86  Last Weight:  Wt Readings from Last 1 Encounters:  03/09/24 106 lb (48.1 kg)   Last Height:   Ht Readings from Last 1 Encounters:  03/09/24 5\' 1"  (1.549 m)     Exam: NAD, pleasant, frail                 Speech:    Speech is dysarthric(adentulous);   Cognition: Dementia; can follow one-step commands   Cranial Nerves:    The pupils are pin point equal, round.Trigeminal sensation is intact and the muscles of mastication are normal. The face is symmetric. The palate elevates in the midline. Hearing intact to voice. Voice is hoarse. Shoulder shrug is normal. The tongue has normal motion without fasciculations.   Coordination:  No dysmetria  Motor Observation:    Mild tremor postural Tone:    decreased muscle tone.     Strength: Moving all extremities antigravity no focal deficits     Sensation: intact to LT  Gait: In wheelchair cannot stand independently   Assessment/Plan:  Here with son, this is a lovely 64 year old with dementia here for follow up after the hospital Here with son, we reviewed inpatient course with recently diagnosed TIA; MRi of the brain showed no acute finding, MRA of the head with multifocal stenoses, son agrees at this time we will not order anymore follow up or any interventions or change any medications, continue  current therapy, fall precautions, concentrate on quality of remaining life.  Naomie Dean, MD  [facility] [address] [place], [place] [address]  Phone [phone] Fax [phone]  I spent 45 minutes of face-to-face and non-face-to-face time with patient on the  1. TIA (transient ischemic attack)   2. Altered mental status, unspecified altered mental status type   3. Dementia, unspecified dementia severity, unspecified dementia type, unspecified whether behavioral, psychotic, or mood disturbance or anxiety (HCC)    diagnosis.  This included previsit chart review, lab review, study  review, order entry, electronic health record documentation, patient education on the different diagnostic and therapeutic options, counseling and coordination of care, risks and benefits of management, compliance, or risk factor reduction

## 2024-04-02 ENCOUNTER — Ambulatory Visit: Payer: Medicare Other | Admitting: Gastroenterology

## 2024-04-02 DIAGNOSIS — I10 Essential (primary) hypertension: Secondary | ICD-10-CM | POA: Diagnosis not present

## 2024-04-02 DIAGNOSIS — T17920A Food in respiratory tract, part unspecified causing asphyxiation, initial encounter: Secondary | ICD-10-CM | POA: Diagnosis not present

## 2024-04-03 ENCOUNTER — Emergency Department (HOSPITAL_COMMUNITY)

## 2024-04-03 ENCOUNTER — Emergency Department (HOSPITAL_COMMUNITY)
Admission: EM | Admit: 2024-04-03 | Discharge: 2024-04-03 | Disposition: A | Discharge status: Home / Self Care | Attending: Emergency Medicine | Admitting: Emergency Medicine

## 2024-04-03 ENCOUNTER — Other Ambulatory Visit: Payer: Self-pay

## 2024-04-03 DIAGNOSIS — Z7982 Long term (current) use of aspirin: Secondary | ICD-10-CM | POA: Insufficient documentation

## 2024-04-03 DIAGNOSIS — R1310 Dysphagia, unspecified: Secondary | ICD-10-CM | POA: Insufficient documentation

## 2024-04-03 DIAGNOSIS — R0989 Other specified symptoms and signs involving the circulatory and respiratory systems: Secondary | ICD-10-CM | POA: Diagnosis present

## 2024-04-03 DIAGNOSIS — M8580 Other specified disorders of bone density and structure, unspecified site: Secondary | ICD-10-CM | POA: Diagnosis not present

## 2024-04-03 DIAGNOSIS — R4702 Dysphasia: Secondary | ICD-10-CM | POA: Diagnosis not present

## 2024-04-03 DIAGNOSIS — Z96611 Presence of right artificial shoulder joint: Secondary | ICD-10-CM | POA: Diagnosis not present

## 2024-04-03 DIAGNOSIS — Z96612 Presence of left artificial shoulder joint: Secondary | ICD-10-CM | POA: Diagnosis not present

## 2024-04-03 DIAGNOSIS — Z471 Aftercare following joint replacement surgery: Secondary | ICD-10-CM | POA: Diagnosis not present

## 2024-04-03 DIAGNOSIS — M47812 Spondylosis without myelopathy or radiculopathy, cervical region: Secondary | ICD-10-CM | POA: Diagnosis not present

## 2024-04-03 MED ORDER — LIDOCAINE VISCOUS HCL 2 % MT SOLN
15.0000 mL | Freq: Once | OROMUCOSAL | Status: AC
  Administered 2024-04-03: 15 mL via OROMUCOSAL
  Filled 2024-04-03: qty 15

## 2024-04-03 MED ORDER — SODIUM CHLORIDE 0.9 % IN NEBU
3.0000 mL | INHALATION_SOLUTION | Freq: Three times a day (TID) | RESPIRATORY_TRACT | Status: DC | PRN
  Administered 2024-04-03: 3 mL via RESPIRATORY_TRACT

## 2024-04-03 NOTE — ED Provider Notes (Signed)
 [place] EMERGENCY DEPARTMENT AT [facility] Provider Note   CSN: [id number] Arrival date & time: 04/03/24  0031     History  Chief Complaint  Patient presents with   Choking    Christine Leblanc is a 89 y.o. female.  Patient presents to the emergency department stating that she feels like there is mucus stuck in her throat.  This has happened before, usually she needs suction to get it out.  She feels like she is going to choke if she lies flat.       Home Medications Prior to Admission medications   Medication Sig Start Date End Date Taking? Authorizing Provider  aspirin EC 81 MG tablet Take 1 tablet (81 mg total) by mouth daily. Swallow whole. 01/08/24 04/07/24  Sherryll Burger, Pratik D, DO  atorvastatin (LIPITOR) 40 MG tablet Take 1 tablet (40 mg total) by mouth at bedtime. 01/08/24   Sherryll Burger, Pratik D, DO  azithromycin (ZITHROMAX) 250 MG tablet Take 1 tablet (250 mg total) by mouth daily. Patient not taking: Reported on 03/31/2024 03/10/24   Geoffery Lyons, MD  Carbinoxamine Maleate 4 MG TABS Take 1 tablet (4 mg total) by mouth at bedtime. 01/02/23 02/06/24  Alfonse Spruce, MD  diazepam (VALIUM) 5 MG tablet Take 2.5 mg by mouth daily as needed for anxiety. 05/29/23   [provider]  ferrous sulfate 325 (65 FE) MG tablet Take 325 mg by mouth. Takes one on Monday, Wednesday and Friday    [provider]  fluticasone (FLONASE) 50 MCG/ACT nasal spray Place 2 sprays into both nostrils daily. 12/02/23   Birder Robson, MD  ipratropium (ATROVENT) 0.06 % nasal spray Place 1 spray into both nostrils 4 (four) times daily as needed for rhinitis. 12/02/23   Birder Robson, MD  levothyroxine (SYNTHROID) 88 MCG tablet TAKE 1 TABLET DAILY ON MONDAY THROUGH SATURDAY AND ONE-HALF (1/2) TABLET ON SUNDAY Patient taking differently: Take 88 mcg by mouth See admin instructions. TAKE 1 TABLET DAILY ON MONDAY THROUGH SATURDAY AND ONE-HALF (1/2) TABLET ON SUNDAY 10/10/22   Reather Littler, MD   pantoprazole (PROTONIX) 40 MG tablet Take 1 tablet (40 mg total) by mouth 2 (two) times daily before a meal. 11/06/23   Letta Median, PA-C  senna (SENOKOT) 8.6 MG tablet Take 2 tablets by mouth at bedtime.    [provider]      Allergies    Celecoxib, Dexlansoprazole, and Famotidine    Review of Systems   Review of Systems  Physical Exam Updated Vital Signs BP (!) 220/110 (BP Location: Left Arm)   Pulse 79   Temp 98.2 F (36.8 C) (Oral)   Resp 16   SpO2 100%  Physical Exam Vitals and nursing note reviewed.  Constitutional:      General: She is not in acute distress.    Appearance: She is well-developed.  HENT:     Head: Normocephalic and atraumatic.     Mouth/Throat:     Mouth: Mucous membranes are moist.  Eyes:     General: Vision grossly intact. Gaze aligned appropriately.     Extraocular Movements: Extraocular movements intact.     Conjunctiva/sclera: Conjunctivae normal.  Cardiovascular:     Rate and Rhythm: Normal rate and regular rhythm.     Pulses: Normal pulses.     Heart sounds: Normal heart sounds, S1 normal and S2 normal. No murmur heard.    No friction rub. No gallop.  Pulmonary:     Effort:  Pulmonary effort is normal. No respiratory distress.     Breath sounds: Normal breath sounds.  Abdominal:     General: Bowel sounds are normal.     Palpations: Abdomen is soft.     Tenderness: There is no abdominal tenderness. There is no guarding or rebound.     Hernia: No hernia is present.  Musculoskeletal:        General: No swelling.     Cervical back: Full passive range of motion without pain, normal range of motion and neck supple. No spinous process tenderness or muscular tenderness. Normal range of motion.     Right lower leg: No edema.     Left lower leg: No edema.  Skin:    General: Skin is warm and dry.     Capillary Refill: Capillary refill takes less than 2 seconds.     Findings: No ecchymosis, erythema, rash or wound.  Neurological:      General: No focal deficit present.     Mental Status: She is alert and oriented to person, place, and time.     GCS: GCS eye subscore is 4. GCS verbal subscore is 5. GCS motor subscore is 6.     Cranial Nerves: Cranial nerves 2-12 are intact.     Sensory: Sensation is intact.     Motor: Motor function is intact.     Coordination: Coordination is intact.  Psychiatric:        Attention and Perception: Attention normal.        Mood and Affect: Mood normal.        Speech: Speech normal.        Behavior: Behavior normal.     ED Results / Procedures / Treatments   Labs (all labs ordered are listed, but only abnormal results are displayed) Labs Reviewed - No data to display  EKG None  Radiology DG Chest 2 View Result Date: 04/03/2024 CLINICAL DATA:  Dysphagia EXAM: CHEST - 2 VIEW COMPARISON:  03/16/2024 FINDINGS: Lungs are well expanded, symmetric, and clear. No pneumothorax or pleural effusion. Cardiac size within normal limits. Pulmonary vascularity is normal. Osseous structures are age-appropriate. Status post bilateral total shoulder arthroplasty. No acute bone abnormality. IMPRESSION: No active cardiopulmonary disease. Electronically Signed   By: Helyn Numbers M.D.   On: 04/03/2024 03:46   DG Neck Soft Tissue Result Date: 04/03/2024 CLINICAL DATA:  Dysphasia, choke the osseous structures EXAM: NECK SOFT TISSUES - 1+ VIEW COMPARISON:  None Available. FINDINGS: There is no evidence of retropharyngeal soft tissue swelling or epiglottic enlargement. The cervical airway is unremarkable and no radio-opaque foreign body identified. Are diffusely osteopenic. Multilevel degenerative changes are seen within the cervical spine. IMPRESSION: 1. No radiographic evidence of acute pharyngitis. 2. Osteopenia. 3. Multilevel degenerative disc disease. Electronically Signed   By: Helyn Numbers M.D.   On: 04/03/2024 03:45    Procedures Procedures    Medications Ordered in ED Medications  sodium  chloride 0.9 % nebulizer solution 3 mL (3 mLs Nebulization Given 04/03/24 0223)  lidocaine (XYLOCAINE) 2 % viscous mouth solution 15 mL (15 mLs Mouth/Throat Given 04/03/24 0252)    ED Course/ Medical Decision Making/ A&P                                 Medical Decision Making Amount and/or Complexity of Data Reviewed Radiology: ordered.  Risk Prescription drug management.   Presents to the emergency department with the  sensation that she has mucus stuck in the back of her throat.  She is breathing comfortably.  She is talking without difficulty.  There is no stridor noted.  Soft tissue neck unremarkable.  Chest x-ray unremarkable.  Patient administered nebulized saline and viscous lidocaine, has improved.  She is now sleeping comfortably.  Will discharge, follow-up with primary care.        Final Clinical Impression(s) / ED Diagnoses Final diagnoses:  Dysphagia, unspecified type    Rx / DC Orders ED Discharge Orders     None         Tram Wrenn, Canary Brim, MD 04/03/24 [phone]

## 2024-04-03 NOTE — ED Triage Notes (Signed)
 Rcems from home. Cc of feeling like she's choking. Was laying in bed when she felt something in her throat. But hasn't ate anything recently. Said that she gets mucus in her throat and the only thing that helps is suctioning. Has lidocaine at home that hasn't helped. And uses mouth swabs.

## 2024-04-03 NOTE — ED Notes (Signed)
 ED Provider at bedside.

## 2024-04-03 NOTE — Progress Notes (Signed)
 Attempted to NTS patient. Explained risks to patient and family at bedside. Patient is wanting her esophagus suctioned out. Explained to patient that I was with respiratory and was only able to attempt to suction her through her nose and into her airway. Patient agreed to let me try. This RT was unable to pass suction catheter through both nares successfully. Patient was getting uncomfortable and I did not want to risk causing trauma to her nose. Family and patient agreeable to stop trying. Sterile procedure was performed and maintained throughout attempts.

## 2024-04-06 DIAGNOSIS — J4489 Other specified chronic obstructive pulmonary disease: Secondary | ICD-10-CM | POA: Diagnosis not present

## 2024-04-06 DIAGNOSIS — I700 Atherosclerosis of aorta: Secondary | ICD-10-CM | POA: Diagnosis not present

## 2024-04-06 DIAGNOSIS — D630 Anemia in neoplastic disease: Secondary | ICD-10-CM | POA: Diagnosis not present

## 2024-04-06 DIAGNOSIS — D472 Monoclonal gammopathy: Secondary | ICD-10-CM | POA: Diagnosis not present

## 2024-04-06 DIAGNOSIS — J101 Influenza due to other identified influenza virus with other respiratory manifestations: Secondary | ICD-10-CM | POA: Diagnosis not present

## 2024-04-06 DIAGNOSIS — S90121D Contusion of right lesser toe(s) without damage to nail, subsequent encounter: Secondary | ICD-10-CM | POA: Diagnosis not present

## 2024-04-09 ENCOUNTER — Ambulatory Visit (INDEPENDENT_AMBULATORY_CARE_PROVIDER_SITE_OTHER): Admitting: Podiatry

## 2024-04-09 ENCOUNTER — Encounter: Payer: Self-pay | Admitting: Podiatry

## 2024-04-09 DIAGNOSIS — I739 Peripheral vascular disease, unspecified: Secondary | ICD-10-CM | POA: Diagnosis not present

## 2024-04-09 DIAGNOSIS — M79675 Pain in left toe(s): Secondary | ICD-10-CM

## 2024-04-09 DIAGNOSIS — M79674 Pain in right toe(s): Secondary | ICD-10-CM | POA: Diagnosis not present

## 2024-04-09 DIAGNOSIS — L97511 Non-pressure chronic ulcer of other part of right foot limited to breakdown of skin: Secondary | ICD-10-CM

## 2024-04-09 DIAGNOSIS — B351 Tinea unguium: Secondary | ICD-10-CM | POA: Diagnosis not present

## 2024-04-11 NOTE — Progress Notes (Signed)
  Subjective:  Patient ID: Christine Leblanc, female    DOB: Nov 17, 1929,  MRN: [id number]  Chief Complaint  Patient presents with   Blister    RM#13 Right blister on pinky toe not infected pinky toe is black under nail and on toe.No injuries to toe that patient can remember.    Discussed the use of AI scribe software for clinical note transcription with the patient, who gave verbal consent to proceed.  History of Present Illness The patient, with an unspecified past medical history, presents with a month-long issue with her right fifth toe. The patient is unsure of how the problem started but notes that it has improved since she first noticed it. She denies any recent treatment for the issue. The patient also reports a problem with her knee, which has been previously evaluated by an orthopedic doctor. The patient has been experiencing swelling in her legs and has started to exercise to improve circulation.      Objective:    Physical Exam General: AAO x3, NAD  Dermatological: As pictured below there is a dried hemorrhagic blister noted on the right fifth toe.  There is no drainage or pus or any signs of infection noted today.  No fluctuation or crepitation.  No open lesions identified otherwise.  The nails appear to be mildly hypertrophic and dystrophic with yellow discoloration.  They do cause discomfort as they are elongated and they rub inside shoes she reports.  Vascular: Dorsalis Pedis artery and Posterior Tibial artery pedal pulses are decreased on the right compared to left. There is no pain with calf compression, swelling, warmth, erythema.   Neruologic: Grossly intact via light touch bilateral  Musculoskeletal: Chronic edema present bilaterally right side seems to be somewhat worse than left and left foot.  There is no warmth associated with this.  Gait: Unassisted, Nonantalgic.         Results    Assessment:   1. Toe ulcer, right, limited to breakdown of skin [facility])       Plan:  Patient was evaluated and treated and all questions answered.  Assessment and Plan Assessment & Plan Right fifth toe ulcer Ulcer persists over a month, initially suspected gangrene, now improving. Healing suboptimal due to decreased blood flow. Loose nail may detach. - Order ultrasound to assess blood flow in the legs.  ABI ordered - Trim the nail on the right fifth toe to remove loose nail. - Advise to keep the ulcer clean and dry. - Monitor for signs of infection such as redness or drainage.  Peripheral arterial disease Decreased pulses in feet, right weaker than left, affecting ulcer healing. Ultrasound needed to evaluate circulation. - Order ultrasound to assess blood flow in the legs. - Refer to a vascular specialist if the ultrasound shows abnormal results.  General foot care/symptomatic onychosis Leg swelling present, exercise initiated to enhance circulation. - Sharp debrided nails x 10 with any complications or bleeding. - Encourage continued exercise to improve circulation. - Discussed daily foot inspection.  Knee pain - She brought multiple times left knee pain.  Advised not able to treat this.  She has to follow-up with her orthopedic doctor for this.   Return in about 3 weeks (around 04/30/2024) for blister right 5th toe.   Charity Conch DPM

## 2024-04-12 NOTE — Progress Notes (Unsigned)
 Referring Provider: Omie Bickers, MD Primary Care Physician:  Omie Bickers, MD Primary GI Physician: Dr. Mordechai April  No chief complaint on file.   HPI:   Christine Leblanc is a 89 y.o. female with history of GERD, dysphagia, weight loss, constipation, presenting today for follow-up of constipation.   Last seen in the office 02/06/24. Worsening constipation. Taking 2 senna daily. Had taken dulcolax. No GI bleeding. Dysphagia at baseline and doing ok with puree diet. Actually gained some weight. GERD improved with pantoprazole 40 mg BID. Recommended MiraLAX 1/2 capful daily and increase to 1 capful daily if needed. Continue other medications. F/U in 8 weeks.   In the interim, patient was hospitalized with influenza, metabolic encephalopathy in early March 2025.   Today:     ***Needs to drink lots of fluids, consider cool mist vaporizer to help keep secretions thin.   Prior dysphagia evaluation:   EGD March 2016: Proximal esophageal web s/p dilation, multiple gastric polyps s/p multiple biopsies, mild erosive gastritis s/p biopsy.  Pathology with fundic gland polyps, chronic gastritis, negative for H. pylori.    Modified barium swallow 09/24/2022:  Severe oropharyngeal dysphagia characterized by weak lingual movement resulting in prolonged oral transit, piecemeal deglutition, reduced anterior posterior movement with lingual residuals; pharyngeal phase is marked by impaired movement and timing of velopharyngeal closure, reduced tongue base retraction and little no epiglottic deflection with reduced laryngeal vestibule closure resulting in significant vallecula residue and aspiration after the swallow with all liquid consistencies. Head turn to the LEFT was possibly slightly better in terms of moving more of the bolus through the pharynx and UES than head neutral or to the right. Pt with wet vocal quality and intermittent throat clears and some coughing throughout, but never able to remove aspirate  from trachea. The barium tablet became lodged in the valleculae and was not cleared and Pt without awareness of the same (reduced sensation). This imaging was compare to her last MBSS in 2017 and found to be similar in terms of physiology, but worse (greater weakness and increased residuals and aspiration).    Pt may wish to consider alternative means of nutrition to supplement oral nutrition, however she is unsure she wants to do this. Recommend that she meet with a dietician to help guide her in amount of calories needed per day. She would like to continue with oral diet and she was encouraged to continue practicing good oral care and improve her cough to continue with D2/soft finely chopped and thin liquids. There did not appear to be an appreciable difference in thin versus thickened liquids (increased residuals with thicker consistencies and textures). Because the barium tablet was not removed from her valleculae, she should try to crush medications as able and take in puree/yogurt and follow with liquid wash.   Seen by speech therapy 07/24/2023 felt to have stable, severe oropharyngeal dysphagia. She was found to have decreased coordination of swallow, little to no epiglottic deflection, diminished pharyngeal stripping wave and decreased laryngeal vestibule closure resulting in trace to moderate amount of silent aspiration of all liquid consistencies. Moderate to majority of contrast remained in pharyngeal space after initial swallow, worse with thicker/heavier consistencies. Head turned to the left with slight chin tuck with swallows did seem to facilitate pharyngeal clearance in conjunction with liquid wash to clear residue. Patient with wet vocal quality and intermittent throat clears and some coughing throughout, the patient was never able to remove aspirate from trachea. Barium tablet became lodged  in the vallecula and was not cleared, similar to prior modified barium swallow. As she was maintaining  her weight and had not had recurrent admissions with pneumonia, recommended continuing protein shakes, D2/pured diet as well as home health speech therapy.   Past Medical History:  Diagnosis Date   Anemia    Asthma    Baker's cyst    Cancer (HCC)    skin Ca- ? basal cell    Chronic back pain    Chronic neck pain    Degenerative joint disease    Left shoulder; cervical spine, knees & hands    Gastroesophageal reflux disease    Hiatal hernia; distal esophageal web requiring dilatation; gastric polyps; gastritis; refuses colonoscopy   GERD (gastroesophageal reflux disease)    History of stress test 1990's   stress test done under the care of Dr. Macdonald Savoy & Dr. Katheryne Pane, now being followed by Dr. Amanda Jungling- in [place] , recently seen & told to f/U in one yr.    Hyperlipidemia 04/27/2011   Hypertension    Hypothyroidism    Lymphocytic thyroiditis    Mild carotid artery disease (HCC)    Multiple thyroid nodules 2010   Adenomatous; thyroidectomy in 2010   Orthostasis    Osteoarthritis of right knee 07/27/2014   Pneumonia    hosp. for pneumonia- long time ago    Primary localized osteoarthrosis of right shoulder 06/21/2015   Seizures (HCC)    yes- as a child- & into adult years, states she took med. for them at one time, stopped at 32 yrs. of age    Syncope and collapse 2007   Possible CVA in 2012 with left lower extremity weakness; refused hospitalization; CT-Atrophy and chronic microvascular ischemic change.     Past Surgical History:  Procedure Laterality Date   ABDOMINAL HYSTERECTOMY     fibroids   CATARACT EXTRACTION     Bilateral; redo surgery on the right for incomplete primary procedure   COLONOSCOPY  Remote   ESOPHAGOGASTRODUODENOSCOPY  11/2009   Dr. Nolene Baumgarten: probable distal web s/p dilation small hiatal hernia/gastric polyps/mild gastritis, appeared to have narrowing at the junction of D1 and D2 dilated up to 12 mm   ESOPHAGOGASTRODUODENOSCOPY N/A 03/21/2015   Surgeon:  Alyce Jubilee, MD; proximal esophageal web s/p dilation, multiple gastric polyps s/p multiple biopsies, mild erosive gastritis s/p biopsy.  Pathology with fundic gland polyps, chronic gastritis, negative for H. pylori.   EYE SURGERY     PROLAPSED UTERINE FIBROID LIGATION     outcomed with rectocele & cystocele   SAVORY DILATION N/A 03/21/2015   Procedure: SAVORY DILATION;  Surgeon: Alyce Jubilee, MD;  Location: [facility]ITE;  Service: Endoscopy;  Laterality: N/A;   SHOULDER SURGERY     left   TOTAL KNEE ARTHROPLASTY Right 07/27/2014   Procedure: RIGHT TOTAL KNEE ARTHROPLASTY;  Surgeon: Neville Barbone, MD;  Location: [facility] OR;  Service: Orthopedics;  Laterality: Right;   TOTAL SHOULDER ARTHROPLASTY Right 06/21/2015   Procedure: RIGHT TOTAL SHOULDER ARTHROPLASTY;  Surgeon: Osa Blase, MD;  Location: [facility] OR;  Service: Orthopedics;  Laterality: Right;   TOTAL THYROIDECTOMY  2010    Current Outpatient Medications  Medication Sig Dispense Refill   atorvastatin (LIPITOR) 40 MG tablet Take 1 tablet (40 mg total) by mouth at bedtime. 30 tablet 3   azithromycin (ZITHROMAX) 250 MG tablet Take 1 tablet (250 mg total) by mouth daily. (Patient not taking: Reported on 04/09/2024) 4 tablet 0   Carbinoxamine Maleate 4 MG  TABS Take 1 tablet (4 mg total) by mouth at bedtime. 90 tablet 4   diazepam (VALIUM) 5 MG tablet Take 2.5 mg by mouth daily as needed for anxiety.     ferrous sulfate 325 (65 FE) MG tablet Take 325 mg by mouth. Takes one on Monday, Wednesday and Friday     fluticasone (FLONASE) 50 MCG/ACT nasal spray Place 2 sprays into both nostrils daily. 16 g 5   ipratropium (ATROVENT) 0.06 % nasal spray Place 1 spray into both nostrils 4 (four) times daily as needed for rhinitis. 15 mL 5   levothyroxine (SYNTHROID) 88 MCG tablet TAKE 1 TABLET DAILY ON MONDAY THROUGH SATURDAY AND ONE-HALF (1/2) TABLET ON SUNDAY (Patient taking differently: Take 88 mcg by mouth See admin instructions. TAKE 1 TABLET DAILY  ON MONDAY THROUGH SATURDAY AND ONE-HALF (1/2) TABLET ON SUNDAY) 90 tablet 3   pantoprazole (PROTONIX) 40 MG tablet Take 1 tablet (40 mg total) by mouth 2 (two) times daily before a meal. 180 tablet 3   senna (SENOKOT) 8.6 MG tablet Take 2 tablets by mouth at bedtime.     No current facility-administered medications for this visit.    Allergies as of 04/13/2024 - Review Complete 04/09/2024  Allergen Reaction Noted   Celecoxib Shortness Of Breath    Dexlansoprazole Anaphylaxis and Other (See Comments) 05/04/2016   Famotidine Other (See Comments) 11/04/2018    Family History  Problem Relation Age of Onset   Heart disease Mother    Gallbladder disease Mother    Heart failure Mother    Diabetes Mother    Breast cancer Sister        30  years ago   Diabetes Sister    Heart attack Father    Diabetes Maternal Grandmother    Colon cancer Neg Hx    Stroke Neg Hx     Social History   Socioeconomic History   Marital status: Widowed    Spouse name: Not on file   Number of children: 4   Years of education: 25   Highest education level: Not on file  Occupational History   Occupation: Artist    Comment: does not yield regular income   Occupation: Retired    Comment: Engineer, civil (consulting)  Tobacco Use   Smoking status: Former    Current packs/day: 0.00    Average packs/day: 0.8 packs/day for 20.0 years (16.0 ttl pk-yrs)    Types: Cigarettes    Start date: 12/31/1958    Quit date: 12/31/1978    Years since quitting: 45.3   Smokeless tobacco: Never  Vaping Use   Vaping status: Never Used  Substance and Sexual Activity   Alcohol use: Not Currently    Comment: rarely   Drug use: No   Sexual activity: Never    Birth control/protection: Surgical    Comment: widowed since 2010  Other Topics Concern   Not on file  Social History Narrative   ** Merged History Encounter **       Lives at home. Her daughter lives with her.  Caffeine use: 1 cup coffee per day    Social Drivers of Health    Financial Resource Strain: Not on file  Food Insecurity: No Food Insecurity (03/05/2024)   Hunger Vital Sign    Worried About Running Out of Food in the Last Year: Never true    Ran Out of Food in the Last Year: Never true  Transportation Needs: No Transportation Needs (03/05/2024)   PRAPARE - Transportation  Lack of Transportation (Medical): No    Lack of Transportation (Non-Medical): No  Physical Activity: Not on file  Stress: Not on file  Social Connections: Socially Isolated (03/05/2024)   Social Connection and Isolation Panel [NHANES]    Frequency of Communication with Friends and Family: More than three times a week    Frequency of Social Gatherings with Friends and Family: More than three times a week    Attends Religious Services: Never    Database administrator or Organizations: No    Attends Banker Meetings: Never    Marital Status: Widowed    Review of Systems: Gen: Denies fever, chills, anorexia. Denies fatigue, weakness, weight loss.  CV: Denies chest pain, palpitations, syncope, peripheral edema, and claudication. Resp: Denies dyspnea at rest, cough, wheezing, coughing up blood, and pleurisy. GI: Denies vomiting blood, jaundice, and fecal incontinence.   Denies dysphagia or odynophagia. Derm: Denies rash, itching, dry skin Psych: Denies depression, anxiety, memory loss, confusion. No homicidal or suicidal ideation.  Heme: Denies bruising, bleeding, and enlarged lymph nodes.  Physical Exam: There were no vitals taken for this visit. General:   Alert and oriented. No distress noted. Pleasant and cooperative.  Head:  Normocephalic and atraumatic. Eyes:  Conjuctiva clear without scleral icterus. Heart:  S1, S2 present without murmurs appreciated. Lungs:  Clear to auscultation bilaterally. No wheezes, rales, or rhonchi. No distress.  Abdomen:  +BS, soft, non-tender and non-distended. No rebound or guarding. No HSM or masses noted. Msk:  Symmetrical without  gross deformities. Normal posture. Extremities:  Without edema. Neurologic:  Alert and  oriented x4 Psych:  Normal mood and affect.    Assessment:     Plan:  ***   Shana Daring, PA-C [facility] 04/13/2024

## 2024-04-13 ENCOUNTER — Ambulatory Visit (INDEPENDENT_AMBULATORY_CARE_PROVIDER_SITE_OTHER): Admitting: Gastroenterology

## 2024-04-13 ENCOUNTER — Encounter: Payer: Self-pay | Admitting: Hematology

## 2024-04-13 ENCOUNTER — Encounter: Payer: Self-pay | Admitting: Gastroenterology

## 2024-04-13 VITALS — BP 164/71 | HR 90 | Temp 98.4°F | Ht 61.0 in | Wt 102.2 lb

## 2024-04-13 DIAGNOSIS — D631 Anemia in chronic kidney disease: Secondary | ICD-10-CM | POA: Diagnosis not present

## 2024-04-13 DIAGNOSIS — D472 Monoclonal gammopathy: Secondary | ICD-10-CM | POA: Diagnosis not present

## 2024-04-13 DIAGNOSIS — I129 Hypertensive chronic kidney disease with stage 1 through stage 4 chronic kidney disease, or unspecified chronic kidney disease: Secondary | ICD-10-CM | POA: Diagnosis not present

## 2024-04-13 DIAGNOSIS — S90121D Contusion of right lesser toe(s) without damage to nail, subsequent encounter: Secondary | ICD-10-CM | POA: Diagnosis not present

## 2024-04-13 DIAGNOSIS — K5909 Other constipation: Secondary | ICD-10-CM

## 2024-04-13 DIAGNOSIS — E8809 Other disorders of plasma-protein metabolism, not elsewhere classified: Secondary | ICD-10-CM | POA: Diagnosis not present

## 2024-04-13 DIAGNOSIS — M1712 Unilateral primary osteoarthritis, left knee: Secondary | ICD-10-CM | POA: Diagnosis not present

## 2024-04-13 DIAGNOSIS — D696 Thrombocytopenia, unspecified: Secondary | ICD-10-CM | POA: Diagnosis not present

## 2024-04-13 DIAGNOSIS — E876 Hypokalemia: Secondary | ICD-10-CM | POA: Diagnosis not present

## 2024-04-13 DIAGNOSIS — I700 Atherosclerosis of aorta: Secondary | ICD-10-CM | POA: Diagnosis not present

## 2024-04-13 DIAGNOSIS — R1310 Dysphagia, unspecified: Secondary | ICD-10-CM

## 2024-04-13 DIAGNOSIS — N1832 Chronic kidney disease, stage 3b: Secondary | ICD-10-CM | POA: Diagnosis not present

## 2024-04-13 DIAGNOSIS — M19041 Primary osteoarthritis, right hand: Secondary | ICD-10-CM | POA: Diagnosis not present

## 2024-04-13 DIAGNOSIS — E890 Postprocedural hypothyroidism: Secondary | ICD-10-CM | POA: Diagnosis not present

## 2024-04-13 DIAGNOSIS — R1319 Other dysphagia: Secondary | ICD-10-CM | POA: Diagnosis not present

## 2024-04-13 DIAGNOSIS — J4489 Other specified chronic obstructive pulmonary disease: Secondary | ICD-10-CM | POA: Diagnosis not present

## 2024-04-13 DIAGNOSIS — M47812 Spondylosis without myelopathy or radiculopathy, cervical region: Secondary | ICD-10-CM | POA: Diagnosis not present

## 2024-04-13 DIAGNOSIS — E785 Hyperlipidemia, unspecified: Secondary | ICD-10-CM | POA: Diagnosis not present

## 2024-04-13 DIAGNOSIS — M549 Dorsalgia, unspecified: Secondary | ICD-10-CM | POA: Diagnosis not present

## 2024-04-13 DIAGNOSIS — G40909 Epilepsy, unspecified, not intractable, without status epilepticus: Secondary | ICD-10-CM | POA: Diagnosis not present

## 2024-04-13 DIAGNOSIS — E860 Dehydration: Secondary | ICD-10-CM | POA: Diagnosis not present

## 2024-04-13 DIAGNOSIS — D630 Anemia in neoplastic disease: Secondary | ICD-10-CM | POA: Diagnosis not present

## 2024-04-13 DIAGNOSIS — G8929 Other chronic pain: Secondary | ICD-10-CM | POA: Diagnosis not present

## 2024-04-13 DIAGNOSIS — R1312 Dysphagia, oropharyngeal phase: Secondary | ICD-10-CM

## 2024-04-13 DIAGNOSIS — E46 Unspecified protein-calorie malnutrition: Secondary | ICD-10-CM | POA: Diagnosis not present

## 2024-04-13 DIAGNOSIS — M19042 Primary osteoarthritis, left hand: Secondary | ICD-10-CM | POA: Diagnosis not present

## 2024-04-13 DIAGNOSIS — J101 Influenza due to other identified influenza virus with other respiratory manifestations: Secondary | ICD-10-CM | POA: Diagnosis not present

## 2024-04-13 DIAGNOSIS — M19012 Primary osteoarthritis, left shoulder: Secondary | ICD-10-CM | POA: Diagnosis not present

## 2024-04-13 NOTE — Patient Instructions (Addendum)
 Start MiraLAX 17 g twice daily in 8 oz of water or other non-carbonated beverage of your choice.  Once your bowels are moving well, you can decrease MiraLAX to once a day.  Continue puree diet.   Add 2 protein shakes daily.   I will see you back in 3 months or sooner if needed.   Shana Daring, PA-C [facility]

## 2024-04-14 ENCOUNTER — Telehealth: Payer: Self-pay | Admitting: Primary Care

## 2024-04-14 ENCOUNTER — Ambulatory Visit: Payer: Medicare Other | Admitting: Primary Care

## 2024-04-14 NOTE — Telephone Encounter (Signed)
 Spoke with daughter (on [place]) to inform her Eulas Hick, NP is out sick and we needed to reschedule the 2:00 pm appointment----she states they will call back to reschedule since she did not have their schedule

## 2024-04-15 DIAGNOSIS — S90121D Contusion of right lesser toe(s) without damage to nail, subsequent encounter: Secondary | ICD-10-CM | POA: Diagnosis not present

## 2024-04-15 DIAGNOSIS — J101 Influenza due to other identified influenza virus with other respiratory manifestations: Secondary | ICD-10-CM | POA: Diagnosis not present

## 2024-04-15 DIAGNOSIS — D472 Monoclonal gammopathy: Secondary | ICD-10-CM | POA: Diagnosis not present

## 2024-04-15 DIAGNOSIS — D630 Anemia in neoplastic disease: Secondary | ICD-10-CM | POA: Diagnosis not present

## 2024-04-15 DIAGNOSIS — J4489 Other specified chronic obstructive pulmonary disease: Secondary | ICD-10-CM | POA: Diagnosis not present

## 2024-04-15 DIAGNOSIS — I700 Atherosclerosis of aorta: Secondary | ICD-10-CM | POA: Diagnosis not present

## 2024-04-16 ENCOUNTER — Telehealth (HOSPITAL_COMMUNITY): Payer: Self-pay

## 2024-04-16 NOTE — Telephone Encounter (Signed)
 Attempted to contact the patient to schedule VAS [place]. No answer. Left message. First Attempt Provided direct contact number for scheduling: [phone].

## 2024-04-20 DIAGNOSIS — D472 Monoclonal gammopathy: Secondary | ICD-10-CM | POA: Diagnosis not present

## 2024-04-20 DIAGNOSIS — S90121D Contusion of right lesser toe(s) without damage to nail, subsequent encounter: Secondary | ICD-10-CM | POA: Diagnosis not present

## 2024-04-20 DIAGNOSIS — I700 Atherosclerosis of aorta: Secondary | ICD-10-CM | POA: Diagnosis not present

## 2024-04-20 DIAGNOSIS — J101 Influenza due to other identified influenza virus with other respiratory manifestations: Secondary | ICD-10-CM | POA: Diagnosis not present

## 2024-04-20 DIAGNOSIS — D630 Anemia in neoplastic disease: Secondary | ICD-10-CM | POA: Diagnosis not present

## 2024-04-20 DIAGNOSIS — J4489 Other specified chronic obstructive pulmonary disease: Secondary | ICD-10-CM | POA: Diagnosis not present

## 2024-04-21 ENCOUNTER — Encounter (HOSPITAL_COMMUNITY): Payer: Self-pay

## 2024-04-21 ENCOUNTER — Other Ambulatory Visit: Payer: Self-pay

## 2024-04-21 ENCOUNTER — Emergency Department (HOSPITAL_COMMUNITY)
Admission: EM | Admit: 2024-04-21 | Discharge: 2024-04-22 | Disposition: A | Discharge status: Home / Self Care | Attending: Emergency Medicine | Admitting: Emergency Medicine

## 2024-04-21 DIAGNOSIS — R739 Hyperglycemia, unspecified: Secondary | ICD-10-CM

## 2024-04-21 DIAGNOSIS — I10 Essential (primary) hypertension: Secondary | ICD-10-CM | POA: Diagnosis not present

## 2024-04-21 DIAGNOSIS — J45909 Unspecified asthma, uncomplicated: Secondary | ICD-10-CM | POA: Diagnosis not present

## 2024-04-21 DIAGNOSIS — D649 Anemia, unspecified: Secondary | ICD-10-CM | POA: Insufficient documentation

## 2024-04-21 DIAGNOSIS — Z96612 Presence of left artificial shoulder joint: Secondary | ICD-10-CM | POA: Diagnosis not present

## 2024-04-21 DIAGNOSIS — R4182 Altered mental status, unspecified: Secondary | ICD-10-CM | POA: Insufficient documentation

## 2024-04-21 DIAGNOSIS — I6523 Occlusion and stenosis of bilateral carotid arteries: Secondary | ICD-10-CM | POA: Diagnosis not present

## 2024-04-21 DIAGNOSIS — Z7951 Long term (current) use of inhaled steroids: Secondary | ICD-10-CM | POA: Diagnosis not present

## 2024-04-21 DIAGNOSIS — R519 Headache, unspecified: Secondary | ICD-10-CM

## 2024-04-21 DIAGNOSIS — R030 Elevated blood-pressure reading, without diagnosis of hypertension: Secondary | ICD-10-CM | POA: Diagnosis not present

## 2024-04-21 DIAGNOSIS — G4489 Other headache syndrome: Secondary | ICD-10-CM | POA: Diagnosis not present

## 2024-04-21 DIAGNOSIS — Z96611 Presence of right artificial shoulder joint: Secondary | ICD-10-CM | POA: Diagnosis not present

## 2024-04-21 NOTE — ED Triage Notes (Signed)
 Pt c/o headache that started tonight and that she is more confused than normal. Pt hypertensive.

## 2024-04-22 ENCOUNTER — Ambulatory Visit
Admission: EM | Admit: 2024-04-22 | Discharge: 2024-04-22 | Disposition: A | Discharge status: Home / Self Care | Attending: Family Medicine | Admitting: Family Medicine

## 2024-04-22 ENCOUNTER — Emergency Department (HOSPITAL_COMMUNITY)

## 2024-04-22 DIAGNOSIS — R4182 Altered mental status, unspecified: Secondary | ICD-10-CM | POA: Diagnosis not present

## 2024-04-22 DIAGNOSIS — R051 Acute cough: Secondary | ICD-10-CM | POA: Diagnosis not present

## 2024-04-22 DIAGNOSIS — I6523 Occlusion and stenosis of bilateral carotid arteries: Secondary | ICD-10-CM | POA: Diagnosis not present

## 2024-04-22 DIAGNOSIS — I10 Essential (primary) hypertension: Secondary | ICD-10-CM | POA: Diagnosis not present

## 2024-04-22 DIAGNOSIS — R0602 Shortness of breath: Secondary | ICD-10-CM

## 2024-04-22 DIAGNOSIS — R030 Elevated blood-pressure reading, without diagnosis of hypertension: Secondary | ICD-10-CM | POA: Diagnosis not present

## 2024-04-22 DIAGNOSIS — Z96611 Presence of right artificial shoulder joint: Secondary | ICD-10-CM | POA: Diagnosis not present

## 2024-04-22 DIAGNOSIS — Z96612 Presence of left artificial shoulder joint: Secondary | ICD-10-CM | POA: Diagnosis not present

## 2024-04-22 LAB — COMPREHENSIVE METABOLIC PANEL WITH GFR
ALT: 11 U/L (ref 0–44)
AST: 18 U/L (ref 15–41)
Albumin: 3 g/dL — ABNORMAL LOW (ref 3.5–5.0)
Alkaline Phosphatase: 66 U/L (ref 38–126)
Anion gap: 8 (ref 5–15)
BUN: 17 mg/dL (ref 8–23)
CO2: 26 mmol/L (ref 22–32)
Calcium: 8.4 mg/dL — ABNORMAL LOW (ref 8.9–10.3)
Chloride: 104 mmol/L (ref 98–111)
Creatinine, Ser: 0.88 mg/dL (ref 0.44–1.00)
GFR, Estimated: >60 mL/min (ref >60)
Glucose, Bld: 102 mg/dL — ABNORMAL HIGH (ref 70–99)
Potassium: 3.2 mmol/L — ABNORMAL LOW (ref 3.5–5.1)
Sodium: 138 mmol/L (ref 135–145)
Total Bilirubin: 0.9 mg/dL (ref 0.0–1.2)
Total Protein: 6.9 g/dL (ref 6.5–8.1)

## 2024-04-22 LAB — URINALYSIS, ROUTINE W REFLEX MICROSCOPIC
Bilirubin Urine: NEGATIVE
Glucose, UA: NEGATIVE mg/dL
Hgb urine dipstick: NEGATIVE
Ketones, ur: NEGATIVE mg/dL
Leukocytes,Ua: NEGATIVE
Nitrite: NEGATIVE
Protein, ur: NEGATIVE mg/dL
Specific Gravity, Urine: 1.011 (ref 1.005–1.030)
pH: 6 (ref 5.0–8.0)

## 2024-04-22 LAB — CBC WITH DIFFERENTIAL/PLATELET
Abs Immature Granulocytes: 0.02 10*3/uL (ref 0.00–0.07)
Basophils Absolute: 0 10*3/uL (ref 0.0–0.1)
Basophils Relative: 1 %
Eosinophils Absolute: 0.4 10*3/uL (ref 0.0–0.5)
Eosinophils Relative: 5 %
HCT: 29.1 % — ABNORMAL LOW (ref 36.0–46.0)
Hemoglobin: 9.3 g/dL — ABNORMAL LOW (ref 12.0–15.0)
Immature Granulocytes: 0 %
Lymphocytes Relative: 27 %
Lymphs Abs: 2.2 10*3/uL (ref 0.7–4.0)
MCH: 29.4 pg (ref 26.0–34.0)
MCHC: 32 g/dL (ref 30.0–36.0)
MCV: 92.1 fL (ref 80.0–100.0)
Monocytes Absolute: 1 10*3/uL (ref 0.1–1.0)
Monocytes Relative: 12 %
Neutro Abs: 4.5 10*3/uL (ref 1.7–7.7)
Neutrophils Relative %: 55 %
Platelets: 206 10*3/uL (ref 150–400)
RBC: 3.16 MIL/uL — ABNORMAL LOW (ref 3.87–5.11)
RDW: 14.8 % (ref 11.5–15.5)
WBC: 8.2 10*3/uL (ref 4.0–10.5)
nRBC: 0 % (ref 0.0–0.2)

## 2024-04-22 MED ORDER — PROCHLORPERAZINE EDISYLATE 10 MG/2ML IJ SOLN
10.0000 mg | Freq: Once | INTRAMUSCULAR | Status: AC
  Administered 2024-04-22: 10 mg via INTRAVENOUS
  Filled 2024-04-22: qty 2

## 2024-04-22 MED ORDER — SODIUM CHLORIDE 0.9 % IV BOLUS
1000.0000 mL | Freq: Once | INTRAVENOUS | Status: AC
  Administered 2024-04-22: 1000 mL via INTRAVENOUS

## 2024-04-22 MED ORDER — POTASSIUM CHLORIDE CRYS ER 20 MEQ PO TBCR
40.0000 meq | EXTENDED_RELEASE_TABLET | Freq: Once | ORAL | Status: AC
  Administered 2024-04-22: 40 meq via ORAL
  Filled 2024-04-22: qty 2

## 2024-04-22 MED ORDER — SODIUM CHLORIDE 0.9 % IN NEBU: 3.0000 mL | INHALATION_SOLUTION | Freq: Every day | RESPIRATORY_TRACT | 0 refills | Status: DC | PRN

## 2024-04-22 MED ORDER — POTASSIUM CHLORIDE CRYS ER 20 MEQ PO TBCR: 20.0000 meq | EXTENDED_RELEASE_TABLET | Freq: Two times a day (BID) | ORAL | 0 refills | Status: DC

## 2024-04-22 NOTE — ED Notes (Signed)
 Patient transported to CT

## 2024-04-22 NOTE — Discharge Instructions (Addendum)
 You may take acetaminophen  as needed for headache.  Your hemoglobin has dropped compared with when you were in the hospital last month.  Please arrange a follow-up appointment with your primary care provider for further evaluation of this.  Return to the emergency department if you have any new or concerning symptoms.

## 2024-04-22 NOTE — ED Notes (Signed)
 Pt son at bedside.

## 2024-04-22 NOTE — Discharge Instructions (Signed)
 Follow-up with your primary care provider as soon as possible for a recheck.  Go to the emergency department for significantly worsening symptoms at any time.

## 2024-04-22 NOTE — ED Provider Notes (Signed)
 [place] EMERGENCY DEPARTMENT AT [facility] Provider Note   CSN: [id number] Arrival date & time: 04/21/24  2331     History  Chief Complaint  Patient presents with   Hypertension   Headache    Christine Leblanc is a 89 y.o. female.  The history is provided by the patient and a relative.  Hypertension Associated symptoms include headaches.  Headache She has history of hypertension, hyperlipidemia, asthma and is brought in by her son because of an episode of confusion at home.  She had complained of a headache tonight when she went to bed.  About half hour later, she woke up and was confused.  She was not able to answer simple questions.  She is somewhat better now.  Headache is left frontal.  There has been no nausea or vomiting and no weakness.   Home Medications Prior to Admission medications   Medication Sig Start Date End Date Taking? Authorizing Provider  potassium chloride  SA (KLOR-CON  M) 20 MEQ tablet Take 1 tablet (20 mEq total) by mouth 2 (two) times daily. 04/22/24  Yes Alissa April, MD  atorvastatin  (LIPITOR) 40 MG tablet Take 1 tablet (40 mg total) by mouth at bedtime. 01/08/24   Mason Sole, Pratik D, DO  Carbinoxamine  Maleate 4 MG TABS Take 1 tablet (4 mg total) by mouth at bedtime. 01/02/23 04/13/24  Rochester Chuck, MD  diazepam  (VALIUM ) 5 MG tablet Take 2.5 mg by mouth daily as needed for anxiety. 05/29/23   [provider]  ferrous sulfate  325 (65 FE) MG tablet Take 325 mg by mouth. Takes one on Monday, Wednesday and Friday    [provider]  fluticasone  (FLONASE ) 50 MCG/ACT nasal spray Place 2 sprays into both nostrils daily. 12/02/23   Kandice Orleans, MD  ipratropium (ATROVENT ) 0.06 % nasal spray Place 1 spray into both nostrils 4 (four) times daily as needed for rhinitis. 12/02/23   Kandice Orleans, MD  levothyroxine  (SYNTHROID ) 88 MCG tablet TAKE 1 TABLET DAILY ON MONDAY THROUGH SATURDAY AND ONE-HALF (1/2) TABLET ON SUNDAY Patient taking  differently: Take 88 mcg by mouth See admin instructions. TAKE 1 TABLET DAILY ON MONDAY THROUGH SATURDAY AND ONE-HALF (1/2) TABLET ON SUNDAY 10/10/22   Lajean Pike, MD  pantoprazole  (PROTONIX ) 40 MG tablet Take 1 tablet (40 mg total) by mouth 2 (two) times daily before a meal. 11/06/23   Evander Hills, PA-C  senna (SENOKOT) 8.6 MG tablet Take 2 tablets by mouth at bedtime.    [provider]      Allergies    Celecoxib, Dexlansoprazole , and Famotidine    Review of Systems   Review of Systems  Neurological:  Positive for headaches.  All other systems reviewed and are negative.   Physical Exam Updated Vital Signs BP (!) 197/100   Pulse 84   Temp 98.1 F (36.7 C) (Oral)   Resp 20   SpO2 97%  Physical Exam Vitals and nursing note reviewed.   89 year old female, resting comfortably and in no acute distress. Vital signs are significant for elevated blood pressure. Oxygen saturation is 98%, which is normal. Head is normocephalic and atraumatic. PERRLA, EOMI.  Neck is nontender and supple without adenopathy  Lungs are clear without rales, wheezes, or rhonchi. Chest is nontender. Heart has regular rate and rhythm without murmur. Abdomen is soft, flat, nontender. Extremities have no cyanosis or edema, full range of motion is present. Skin is warm and dry without rash. Neurologic: Awake and  alert, oriented x 2 but orientation to time is off on the year only (knows it is April, knows that we just had Easter).  However, she is slower to answer questions than is normal for her.  Cranial nerves are intact.  Strength is 5/5 in all 4 extremities.  There is no pronator drift.  There is no extinction on double simultaneous stimulation.  ED Results / Procedures / Treatments   Labs (all labs ordered are listed, but only abnormal results are displayed) Labs Reviewed  COMPREHENSIVE METABOLIC PANEL WITH GFR - Abnormal; Notable for the following components:      Result Value   Potassium  3.2 (*)    Glucose, Bld 102 (*)    Calcium  8.4 (*)    Albumin 3.0 (*)    All other components within normal limits  CBC WITH DIFFERENTIAL/PLATELET - Abnormal; Notable for the following components:   RBC 3.16 (*)    Hemoglobin 9.3 (*)    HCT 29.1 (*)    All other components within normal limits  URINALYSIS, ROUTINE W REFLEX MICROSCOPIC - Abnormal; Notable for the following components:   Color, Urine STRAW (*)    All other components within normal limits    EKG EKG Interpretation Date/Time:  Wednesday April 22 2024 01:26:13 EDT Ventricular Rate:  79 PR Interval:  176 QRS Duration:  130 QT Interval:  413 QTC Calculation: 474 R Axis:   80  Text Interpretation: Sinus rhythm LAE, consider biatrial enlargement Right bundle branch block Minimal ST elevation, lateral leads When compared with ECG of 03/04/2024, HEART RATE has decreased Confirmed by Alissa April ([id number]) on 04/22/2024 1:36:15 AM  Radiology CT Head Wo Contrast Result Date: 04/22/2024 CLINICAL DATA:  Altered mental status EXAM: CT HEAD WITHOUT CONTRAST TECHNIQUE: Contiguous axial images were obtained from the base of the skull through the vertex without intravenous contrast. RADIATION DOSE REDUCTION: This exam was performed according to the departmental dose-optimization program which includes automated exposure control, adjustment of the mA and/or kV according to patient size and/or use of iterative reconstruction technique. COMPARISON:  03/04/2024 FINDINGS: Brain: There is no mass, hemorrhage or extra-axial collection. There is generalized atrophy without lobar predilection. Hypodensity of the white matter is most commonly associated with chronic microvascular disease. Old right basal ganglia small vessel infarct. Vascular: Atherosclerotic calcification of the internal carotid arteries at the skull base. No abnormal hyperdensity of the major intracranial arteries or dural venous sinuses. Skull: The visualized skull base, calvarium and  extracranial soft tissues are normal. Sinuses/Orbits: No fluid levels or advanced mucosal thickening of the visualized paranasal sinuses. No mastoid or middle ear effusion. Normal orbits. Other: None. IMPRESSION: 1. No acute intracranial abnormality. 2. Generalized atrophy and findings of chronic microvascular disease. 3. Old right basal ganglia small vessel infarct. Electronically Signed   By: Juanetta Nordmann M.D.   On: 04/22/2024 02:17   DG Chest 2 View Result Date: 04/22/2024 CLINICAL DATA:  Altered mental status and elevated blood pressure. EXAM: CHEST - 2 VIEW COMPARISON:  None Available. FINDINGS: The heart size and mediastinal contours are within normal limits. Multiple surgical clips are seen within the superior mediastinum. There is marked severity calcification of the aortic arch. Mild, diffuse, chronic appearing increased interstitial lung markings are noted. Adjacent ill-defined 4 mm nodular opacities are seen overlying the mid right lung. These are not visualized on the prior study. There is no evidence of focal consolidation, pleural effusion or pneumothorax. There is evidence of prior bilateral shoulder arthroplasty. Multilevel degenerative  changes are present throughout the thoracic spine. The visualized skeletal structures are unremarkable. IMPRESSION: Stable chronic and postoperative changes without evidence of acute cardiopulmonary disease. Electronically Signed   By: Virgle Grime M.D.   On: 04/22/2024 02:08    Procedures Procedures    Medications Ordered in ED Medications  potassium chloride  SA (KLOR-CON  M) CR tablet 40 mEq (has no administration in time range)  prochlorperazine  (COMPAZINE ) injection 10 mg (10 mg Intravenous Given 04/22/24 0135)  sodium chloride  0.9 % bolus 1,000 mL (0 mLs Intravenous Stopped 04/22/24 0227)    ED Course/ Medical Decision Making/ A&P                                 Medical Decision Making Amount and/or Complexity of Data Reviewed Labs:  ordered. Radiology: ordered.  Risk Prescription drug management.   Altered mental status which is improving but not back to baseline, per her son.  Headache of uncertain cause.  Elevated blood pressure with history of hypertension.  No evidence of stroke on my exam.  Elevated blood pressure could be partly from pain, partly from whitecoat hypertension (review of old records shows she has tended to have very high blood pressures when in the emergency department at [facility] but not in office visits).  I have ordered headache cocktail of normal saline and prochlorperazine .  I have ordered laboratory workup with CBC, comprehensive metabolic panel, urinalysis and I have ordered CT of head to rule out intracranial bleeding, and I ordered chest x-ray to rule out occult infection.  On review of past records, I note hospitalization 03/04/2024-03/09/2024 for altered mental status and was found to have influenza A.  She had complete relief of her headache with above-noted treatment.  I have reviewed her laboratory test, my interpretation is mild hypokalemia, borderline elevated random glucose level, anemia which has progressed compared with 03/09/2024, normal urinalysis.  Chest x-ray shows no evidence of pneumonia, CT of head shows no acute intracranial abnormality.  Have independently viewed all of the images, and agree with the radiologist's interpretation.  Blood pressure was repeated following relief of headache and is still significantly elevated.  I am discharging her with instructions to follow-up with her primary care provider regarding following hemoglobin.  She is advised that she can take acetaminophen  for her headache.  I have ordered a dose of oral potassium and I have given her a prescription for oral potassium.  Elevated random glucose level will need to be followed as an outpatient.  Return if she has any new or concerning symptoms.  Final Clinical Impression(s) / ED Diagnoses Final  diagnoses:  Altered mental status, unspecified altered mental status type  Bad headache  Elevated blood pressure reading with diagnosis of hypertension  Normochromic normocytic anemia    Rx / DC Orders ED Discharge Orders          Ordered    potassium chloride  SA (KLOR-CON  M) 20 MEQ tablet  2 times daily        04/22/24 0301              Alissa April, MD 04/22/24 [phone]

## 2024-04-22 NOTE — ED Triage Notes (Signed)
 Per son pt is having breathing problems and mucus problems was seen in ED last night.

## 2024-04-27 ENCOUNTER — Ambulatory Visit (HOSPITAL_BASED_OUTPATIENT_CLINIC_OR_DEPARTMENT_OTHER): Admitting: Family

## 2024-04-27 ENCOUNTER — Encounter (HOSPITAL_BASED_OUTPATIENT_CLINIC_OR_DEPARTMENT_OTHER): Payer: Self-pay | Admitting: Family

## 2024-04-27 VITALS — BP 154/82 | HR 88 | Ht 61.0 in | Wt 102.0 lb

## 2024-04-27 DIAGNOSIS — E782 Mixed hyperlipidemia: Secondary | ICD-10-CM | POA: Diagnosis not present

## 2024-04-27 DIAGNOSIS — D649 Anemia, unspecified: Secondary | ICD-10-CM | POA: Diagnosis not present

## 2024-04-27 DIAGNOSIS — Z79890 Hormone replacement therapy (postmenopausal): Secondary | ICD-10-CM | POA: Diagnosis not present

## 2024-04-27 DIAGNOSIS — R4182 Altered mental status, unspecified: Secondary | ICD-10-CM | POA: Diagnosis not present

## 2024-04-27 DIAGNOSIS — Z7689 Persons encountering health services in other specified circumstances: Secondary | ICD-10-CM | POA: Diagnosis not present

## 2024-04-27 DIAGNOSIS — M79604 Pain in right leg: Secondary | ICD-10-CM | POA: Diagnosis not present

## 2024-04-27 DIAGNOSIS — F5104 Psychophysiologic insomnia: Secondary | ICD-10-CM | POA: Diagnosis not present

## 2024-04-27 DIAGNOSIS — Z79899 Other long term (current) drug therapy: Secondary | ICD-10-CM | POA: Diagnosis not present

## 2024-04-27 DIAGNOSIS — Z8673 Personal history of transient ischemic attack (TIA), and cerebral infarction without residual deficits: Secondary | ICD-10-CM

## 2024-04-27 DIAGNOSIS — M79605 Pain in left leg: Secondary | ICD-10-CM

## 2024-04-27 DIAGNOSIS — I10 Essential (primary) hypertension: Secondary | ICD-10-CM | POA: Diagnosis not present

## 2024-04-27 DIAGNOSIS — E876 Hypokalemia: Secondary | ICD-10-CM | POA: Diagnosis not present

## 2024-04-27 DIAGNOSIS — E039 Hypothyroidism, unspecified: Secondary | ICD-10-CM | POA: Diagnosis not present

## 2024-04-27 DIAGNOSIS — R519 Headache, unspecified: Secondary | ICD-10-CM | POA: Diagnosis not present

## 2024-04-27 NOTE — Progress Notes (Unsigned)
 Cardiology Office Note:  .   Date:  04/29/2024  ID:  Christine Leblanc, DOB Aug 29, 1929, MRN [id number] PCP: Omie Bickers, MD  [facility] Providers Cardiologist:  Armida Lander, MD    History of Present Illness: .   Christine Leblanc is a 89 y.o. 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   Evaluated 2021 for near syncope with echo normal LVEF, trivial MR and monitor with no significant arrhythmias. Episode felt to be related ot dehydration.  Diagnosed with DVT 10/2022 of left profunda femoral vein with follow up DVT study with no DVT and Baker's cyst.   Last seen 10/2023 recommended for follow up in 1 year.   Multiple admissions since last seen. 12/2023 admission for community acquired PNA and AMS. 12/2022 admission for TIA. 02/2024 admission with encephalopathy in setting of influenza A. Most recent ED visit 04/21/24 for AMS and 04/22/24 for acute cough.   History of Present Illness Presents with a chronic cough, leg pain, and elevated blood pressure. The cough, which has been ongoing for two years, produces yellow mucus but does not disturb the patient's sleep. The patient reports that the cough is not significantly relieved by medication and does not cause wheezing. Encouraged to continue to discuss with PCP. Cough is not keeping her up at night.   The patient's leg pain is daily and described as sharp at times. The pain is exacerbated when the legs are in certain positions, but it is unclear whether the pain is related to activity. The patient has noticed swelling in the legs, particularly after knee surgery many years ago but unchanged from previous.   The patient's blood pressure has been high at recent visits, including a reading of 154/82 at the current visit. The patient does not currently take any medication for hypertension, having been taken off such medication due to low blood pressure readings in the past. Has BP  cuff at home but not checking regularly.    ROS: Please see the history of present illness.    All other systems reviewed and are negative.   Studies Reviewed: Aaron Aas        Please see the history of present illness.    All other systems reviewed and are negative.  Risk Assessment/Calculations:            Physical Exam:   VS:  BP (!) 154/82   Pulse 88   Ht 5\' 1"  (1.549 m)   Wt 102 lb (46.3 kg)   SpO2 96%   BMI 19.27 kg/m    Wt Readings from Last 3 Encounters:  04/27/24 102 lb (46.3 kg)  04/13/24 102 lb 3.2 oz (46.4 kg)  03/09/24 106 lb (48.1 kg)    GEN: Well nourished, well developed in no acute distress NECK: No JVD; No carotid bruits CARDIAC: RRR, no murmurs, rubs, gallops RESPIRATORY:  Clear to auscultation without rales, wheezing or rhonchi  ABDOMEN: Soft, non-tender, non-distended EXTREMITIES:  No edema; No deformity   ASSESSMENT AND PLAN: .    Assessment & Plan Hypertension Blood pressure elevated at 154/82. Antihypertensives previously discontinued due to symptomatic hypotension. - Monitor blood pressure at home daily, seated for 5-10 minutes before reading. - Document readings for two weeks. - Reassess management in two weeks via MyChart or phone. -If BP elevated at that time consider low dose Amlodipine .   Leg pain Chronic sharp leg pain, possibly vascular or knee-related. Activity-related  pain suggests vascular cause. - Schedule ABI as ordered by podiatry to evaluate blood flow.  Knee pain Chronic knee pain post-surgery with swelling. Previous injections ineffective. Potential new treatments to be discussed with orthopedic specialist. - Discuss potential new treatments with Dr. Agatha Horsfall.  Hx of TIA / HLD Continue Atorvastatin  40mg  daily.          Dispo: follow up in 3 months   Signed, Clearnce Curia, NP

## 2024-04-27 NOTE — ED Provider Notes (Signed)
 [facility]    CSN: [id number] Arrival date & time: 04/22/24  1628      History   Chief Complaint No chief complaint on file.   HPI Christine Leblanc is a 89 y.o. female.   Patient presenting today with son who provides part of history as primary caregiver.  She is complaining of cough, congestion and shortness of breath for the past few days.  Was evaluated fully in the emergency department overnight and discharged home this morning after a very reassuring workup to include negative chest x-ray, stable labs, negative CT head as she complained of some altered mental status last night.  States symptoms improved after a saline nebulizer treatment in the emergency department and they are requesting a prescription for this.  Denies fever, chills, wheezing, chest pain, abdominal pain, vomiting, diarrhea.  History of asthma not currently on any inhaler regimen, history of pneumonia.    Past Medical History:  Diagnosis Date   Anemia    Asthma    Baker's cyst    Cancer (HCC)    skin Ca- ? basal cell    Chronic back pain    Chronic neck pain    Degenerative joint disease    Left shoulder; cervical spine, knees & hands    Gastroesophageal reflux disease    Hiatal hernia; distal esophageal web requiring dilatation; gastric polyps; gastritis; refuses colonoscopy   GERD (gastroesophageal reflux disease)    History of stress test 1990's   stress test done under the care of Dr. Macdonald Savoy & Dr. Katheryne Pane, now being followed by Dr. Amanda Jungling- in [place] , recently seen & told to f/U in one yr.    Hyperlipidemia 04/27/2011   Hypertension    Hypothyroidism    Lymphocytic thyroiditis    Mild carotid artery disease (HCC)    Multiple thyroid  nodules 2010   Adenomatous; thyroidectomy in 2010   Orthostasis    Osteoarthritis of right knee 07/27/2014   Pneumonia    hosp. for pneumonia- long time ago    Primary localized osteoarthrosis of right shoulder 06/21/2015   Seizures (HCC)    yes-  as a child- & into adult years, states she took med. for them at one time, stopped at 32 yrs. of age    Syncope and collapse 2007   Possible CVA in 2012 with left lower extremity weakness; refused hospitalization; CT-Atrophy and chronic microvascular ischemic change.     Patient Active Problem List   Diagnosis Date Noted   Encephalopathy 03/06/2024   Hypoalbuminemia due to protein-calorie malnutrition (HCC) 03/05/2024   Influenza A 03/04/2024   TIA (transient ischemic attack) 01/06/2024   SIRS (systemic inflammatory response syndrome) (HCC) 12/09/2023   CAP (community acquired pneumonia) 12/09/2023   Chronic cough 10/03/2023   Dysphagia 08/15/2023   Protein-calorie malnutrition, mild (HCC) 06/22/2022   Acute bronchitis 06/22/2022   Iron deficiency anemia 06/22/2022   COPD (chronic obstructive pulmonary disease) (HCC) 06/22/2022   Loss of weight 10/09/2021   Pharyngeal dysphagia 10/09/2021   Mild carotid artery disease (HCC) 05/19/2020   History of TIA (transient ischemic attack) 12/25/2018   Generalized weakness 09/30/2018   Hypokalemia    MGUS (monoclonal gammopathy of unknown significance) 10/21/2017   Primary localized osteoarthrosis of right shoulder 06/21/2015   S/P shoulder replacement 06/21/2015   Oropharyngeal dysphagia 11/29/2014   Osteoarthritis of right knee 07/27/2014   Postsurgical hypothyroidism 04/22/2014   Acquired hypothyroidism 10/12/2013   Dehydration 08/02/2012   Syncope and collapse    Anemia, normocytic  normochromic 04/26/2012   Gastroesophageal reflux disease    Essential hypertension 12/07/2011   Leg pain, bilateral 04/27/2011   Dyslipidemia 04/27/2011   Other dysphagia 06/07/2010    Past Surgical History:  Procedure Laterality Date   ABDOMINAL HYSTERECTOMY     fibroids   CATARACT EXTRACTION     Bilateral; redo surgery on the right for incomplete primary procedure   COLONOSCOPY  Remote   ESOPHAGOGASTRODUODENOSCOPY  11/2009   Dr. Nolene Baumgarten:  probable distal web s/p dilation small hiatal hernia/gastric polyps/mild gastritis, appeared to have narrowing at the junction of D1 and D2 dilated up to 12 mm   ESOPHAGOGASTRODUODENOSCOPY N/A 03/21/2015   Surgeon: Alyce Jubilee, MD; proximal esophageal web s/p dilation, multiple gastric polyps s/p multiple biopsies, mild erosive gastritis s/p biopsy.  Pathology with fundic gland polyps, chronic gastritis, negative for H. pylori.   EYE SURGERY     PROLAPSED UTERINE FIBROID LIGATION     outcomed with rectocele & cystocele   SAVORY DILATION N/A 03/21/2015   Procedure: SAVORY DILATION;  Surgeon: Alyce Jubilee, MD;  Location: [facility];  Service: Endoscopy;  Laterality: N/A;   SHOULDER SURGERY     left   TOTAL KNEE ARTHROPLASTY Right 07/27/2014   Procedure: RIGHT TOTAL KNEE ARTHROPLASTY;  Surgeon: Neville Barbone, MD;  Location: [facility] OR;  Service: Orthopedics;  Laterality: Right;   TOTAL SHOULDER ARTHROPLASTY Right 06/21/2015   Procedure: RIGHT TOTAL SHOULDER ARTHROPLASTY;  Surgeon: Osa Blase, MD;  Location: [facility] OR;  Service: Orthopedics;  Laterality: Right;   TOTAL THYROIDECTOMY  2010    OB History     Gravida      Para      Term      Preterm      AB      Living  4      SAB      IAB      Ectopic      Multiple      Live Births               Home Medications    Prior to Admission medications   Medication Sig Start Date End Date Taking? Authorizing Provider  sodium chloride  0.9 % nebulizer solution Take 3 mLs by nebulization daily as needed for wheezing. 04/22/24  Yes Corbin Dess, PA-C  atorvastatin  (LIPITOR) 40 MG tablet Take 1 tablet (40 mg total) by mouth at bedtime. 01/08/24   Mason Sole, Pratik D, DO  Carbinoxamine  Maleate 4 MG TABS Take 1 tablet (4 mg total) by mouth at bedtime. 01/02/23 04/27/24  Rochester Chuck, MD  diazepam  (VALIUM ) 5 MG tablet Take 2.5 mg by mouth daily as needed for anxiety. 05/29/23   [provider]  ferrous sulfate   325 (65 FE) MG tablet Take 325 mg by mouth. Takes one on Monday, Wednesday and Friday    [provider]  fluticasone  (FLONASE ) 50 MCG/ACT nasal spray Place 2 sprays into both nostrils daily. 12/02/23   Kandice Orleans, MD  ipratropium (ATROVENT ) 0.06 % nasal spray Place 1 spray into both nostrils 4 (four) times daily as needed for rhinitis. 12/02/23   Kandice Orleans, MD  levothyroxine  (SYNTHROID ) 88 MCG tablet TAKE 1 TABLET DAILY ON MONDAY THROUGH SATURDAY AND ONE-HALF (1/2) TABLET ON SUNDAY Patient taking differently: Take 88 mcg by mouth See admin instructions. TAKE 1 TABLET DAILY ON MONDAY THROUGH SATURDAY AND ONE-HALF (1/2) TABLET ON SUNDAY 10/10/22   Lajean Pike, MD  pantoprazole  (PROTONIX ) 40 MG  tablet Take 1 tablet (40 mg total) by mouth 2 (two) times daily before a meal. 11/06/23   Evander Hills, PA-C  potassium chloride  SA (KLOR-CON  M) 20 MEQ tablet Take 1 tablet (20 mEq total) by mouth 2 (two) times daily. 04/22/24   Alissa April, MD  senna (SENOKOT) 8.6 MG tablet Take 2 tablets by mouth at bedtime.    [provider]    Family History Family History  Problem Relation Age of Onset   Heart disease Mother    Gallbladder disease Mother    Heart failure Mother    Diabetes Mother    Breast cancer Sister        30 years ago   Diabetes Sister    Heart attack Father    Diabetes Maternal Grandmother    Colon cancer Neg Hx    Stroke Neg Hx     Social History Social History   Tobacco Use   Smoking status: Former    Current packs/day: 0.00    Average packs/day: 0.8 packs/day for 20.0 years (16.0 ttl pk-yrs)    Types: Cigarettes    Start date: 12/31/1958    Quit date: 12/31/1978    Years since quitting: 45.3   Smokeless tobacco: Never  Vaping Use   Vaping status: Never Used  Substance Use Topics   Alcohol use: Not Currently    Comment: rarely   Drug use: No     Allergies   Celecoxib, Dexlansoprazole , and Famotidine   Review of Systems Review of  Systems Per HPI  Physical Exam Triage Vital Signs ED Triage Vitals  Encounter Vitals Group     BP 04/22/24 1633 (!) 162/82     Systolic BP Percentile --      Diastolic BP Percentile --      Pulse Rate 04/22/24 1633 96     Resp 04/22/24 1633 14     Temp 04/22/24 1633 98 F (36.7 C)     Temp Source 04/22/24 1633 Oral     SpO2 04/22/24 1633 95 %     Weight --      Height --      Head Circumference --      Peak Flow --      Pain Score 04/22/24 1636 0     Pain Loc --      Pain Education --      Exclude from Growth Chart --    No data found.  Updated Vital Signs BP (!) 162/82 (BP Location: Right Arm)   Pulse 96   Temp 98 F (36.7 C) (Oral)   Resp 14   SpO2 95%   Visual Acuity Right Eye Distance:   Left Eye Distance:   Bilateral Distance:    Right Eye Near:   Left Eye Near:    Bilateral Near:     Physical Exam Vitals and nursing note reviewed.  Constitutional:      Appearance: Normal appearance. She is not ill-appearing.  HENT:     Head: Atraumatic.     Nose: Rhinorrhea present.     Mouth/Throat:     Mouth: Mucous membranes are moist.  Eyes:     Extraocular Movements: Extraocular movements intact.     Conjunctiva/sclera: Conjunctivae normal.  Cardiovascular:     Rate and Rhythm: Normal rate.     Heart sounds: Normal heart sounds.  Pulmonary:     Effort: Pulmonary effort is normal. No respiratory distress.     Breath sounds: Normal breath sounds. No  wheezing or rales.  Musculoskeletal:     Cervical back: Normal range of motion and neck supple.  Skin:    General: Skin is warm and dry.  Neurological:     Mental Status: She is alert. Mental status is at baseline.  Psychiatric:        Mood and Affect: Mood normal.        Thought Content: Thought content normal.        Judgment: Judgment normal.      UC Treatments / Results  Labs (all labs ordered are listed, but only abnormal results are displayed) Labs Reviewed - No data to  display  EKG   Radiology No results found.  Procedures Procedures (including critical care time)  Medications Ordered in UC Medications - No data to display  Initial Impression / Assessment and Plan / UC Course  I have reviewed the triage vital signs and the nursing notes.  Pertinent labs & imaging results that were available during my care of the patient were reviewed by me and considered in my medical decision making (see chart for details).     Mildly hypertensive in triage, otherwise vital signs reassuring.  She is well-appearing and in no acute distress.  Reviewed emergency department findings from overnight and this morning which were as stated in HPI.  Prescription provided for saline nebulizer solution and machine and discussed supportive home care, return precautions.  Final Clinical Impressions(s) / UC Diagnoses   Final diagnoses:  Acute cough  SOB (shortness of breath)     Discharge Instructions      Follow-up with your primary care provider as soon as possible for a recheck.  Go to the emergency department for significantly worsening symptoms at any time.    ED Prescriptions     Medication Sig Dispense Auth. Provider   sodium chloride  0.9 % nebulizer solution Take 3 mLs by nebulization daily as needed for wheezing. 30 mL Corbin Dess, [place]      PDMP not reviewed this encounter.   Corbin Dess, [place] 04/27/24 1713

## 2024-04-27 NOTE — Patient Instructions (Addendum)
 Medication Instructions:  Continue your current medications   *If you need a refill on your cardiac medications before your next appointment, please call your pharmacy*  Testing/Procedures: Your physician has requested that you have an ankle brachial index (ABI). During this test an ultrasound and blood pressure cuff are used to evaluate the arteries that supply the arms and legs with blood. Allow thirty minutes for this exam. There are no restrictions or special instructions.  Please note: We ask at that you not bring children with you during ultrasound (echo/ vascular) testing. Due to room size and safety concerns, children are not allowed in the ultrasound rooms during exams. Our front office staff cannot provide observation of children in our lobby area while testing is being conducted. An adult accompanying a patient to their appointment will only be allowed in the ultrasound room at the discretion of the ultrasound technician under special circumstances. We apologize for any inconvenience.   Follow-Up: At [facility], you and your health needs are our priority.  As part of our continuing mission to provide you with exceptional heart care, our providers are all part of one team.  This team includes your primary Cardiologist (physician) and Advanced Practice Providers or APPs (Physician Assistants and Nurse Practitioners) who all work together to provide you with the care you need, when you need it.  Your next appointment:   3 month(s)  Provider:   You may see Armida Lander, MD or one of the following Advanced Practice Providers on your designated Care Team:   Clearnce Curia, NP  Woodfin Hays, PA-C  [place], [place] Theotis Flake, PA-C     We recommend signing up for the patient portal called "MyChart".  Sign up information is provided on this After Visit Summary.  MyChart is used to connect with patients for Virtual Visits (Telemedicine).  Patients are able to view  lab/test results, encounter notes, upcoming appointments, etc.  Non-urgent messages can be sent to your provider as well.   To learn more about what you can do with MyChart, go to [email or web address].   Other Instructions  Check blood pressure once per day after sitting and resting for 5-10 minutes for 2 weeks. We will send a MyChart message in 2 weeks to check in on your blood pressure. We would like your blood pressure to be less than 140/90 and ideally around 130/80.  We will plan for ABI of your lower extremities to assess the blood flow. If it shows decreased blood flow in your leg arteries, there are medications to help treat the pain.

## 2024-04-30 ENCOUNTER — Ambulatory Visit: Admitting: Podiatry

## 2024-05-04 ENCOUNTER — Other Ambulatory Visit: Payer: Self-pay

## 2024-05-04 ENCOUNTER — Emergency Department (HOSPITAL_COMMUNITY)

## 2024-05-04 ENCOUNTER — Emergency Department (HOSPITAL_COMMUNITY)
Admission: EM | Admit: 2024-05-04 | Discharge: 2024-05-05 | Disposition: A | Discharge status: Home / Self Care | Attending: Emergency Medicine | Admitting: Emergency Medicine

## 2024-05-04 DIAGNOSIS — R519 Headache, unspecified: Secondary | ICD-10-CM | POA: Insufficient documentation

## 2024-05-04 DIAGNOSIS — Z79899 Other long term (current) drug therapy: Secondary | ICD-10-CM | POA: Diagnosis not present

## 2024-05-04 DIAGNOSIS — I10 Essential (primary) hypertension: Secondary | ICD-10-CM | POA: Insufficient documentation

## 2024-05-04 DIAGNOSIS — I672 Cerebral atherosclerosis: Secondary | ICD-10-CM | POA: Diagnosis not present

## 2024-05-04 DIAGNOSIS — I6782 Cerebral ischemia: Secondary | ICD-10-CM | POA: Diagnosis not present

## 2024-05-04 MED ORDER — LABETALOL HCL 5 MG/ML IV SOLN
10.0000 mg | Freq: Once | INTRAVENOUS | Status: AC
  Administered 2024-05-05: 10 mg via INTRAVENOUS
  Filled 2024-05-04: qty 4

## 2024-05-04 NOTE — ED Provider Notes (Signed)
 [place] [facility] AT [facility] Provider Note   CSN: [id number] Arrival date & time: 05/04/24  2255     History {Add pertinent medical, surgical, social history, OB history to HPI:1} Chief Complaint  Patient presents with   Hypertension    Christine Leblanc is a 89 y.o. female.  Presents to the emergency department for evaluation of elevated blood pressure.  She is brought to the ED by her son who is her caregiver.  She has been forgetful and having trouble concentrating today and has had a headache.  Son took her blood pressure multiple times and it was nearly 200 systolic.  He gave her Valium  because she does have a history of anxiety but blood pressure did not come down.       Home Medications Prior to Admission medications   Medication Sig Start Date End Date Taking? Authorizing Provider  atorvastatin  (LIPITOR) 40 MG tablet Take 1 tablet (40 mg total) by mouth at bedtime. 01/08/24   Mason Sole, Pratik D, DO  Carbinoxamine  Maleate 4 MG TABS Take 1 tablet (4 mg total) by mouth at bedtime. 01/02/23 04/27/24  Rochester Chuck, MD  diazepam  (VALIUM ) 5 MG tablet Take 2.5 mg by mouth daily as needed for anxiety. 05/29/23   [provider]  ferrous sulfate  325 (65 FE) MG tablet Take 325 mg by mouth. Takes one on Monday, Wednesday and Friday    [provider]  fluticasone  (FLONASE ) 50 MCG/ACT nasal spray Place 2 sprays into both nostrils daily. 12/02/23   Kandice Orleans, MD  ipratropium (ATROVENT ) 0.06 % nasal spray Place 1 spray into both nostrils 4 (four) times daily as needed for rhinitis. 12/02/23   Kandice Orleans, MD  levothyroxine  (SYNTHROID ) 88 MCG tablet TAKE 1 TABLET DAILY ON MONDAY THROUGH SATURDAY AND ONE-HALF (1/2) TABLET ON SUNDAY Patient taking differently: Take 88 mcg by mouth See admin instructions. TAKE 1 TABLET DAILY ON MONDAY THROUGH SATURDAY AND ONE-HALF (1/2) TABLET ON SUNDAY 10/10/22   Lajean Pike, MD  pantoprazole  (PROTONIX ) 40 MG  tablet Take 1 tablet (40 mg total) by mouth 2 (two) times daily before a meal. 11/06/23   Evander Hills, PA-C  potassium chloride  SA (KLOR-CON  M) 20 MEQ tablet Take 1 tablet (20 mEq total) by mouth 2 (two) times daily. 04/22/24   Alissa April, MD  senna (SENOKOT) 8.6 MG tablet Take 2 tablets by mouth at bedtime.    [provider]  sodium chloride  0.9 % nebulizer solution Take 3 mLs by nebulization daily as needed for wheezing. 04/22/24   Corbin Dess, PA-C      Allergies    Celecoxib, Dexlansoprazole , and Famotidine    Review of Systems   Review of Systems  Physical Exam Updated Vital Signs BP (!) 198/89 (BP Location: Left Arm)   Pulse 85   Temp 98 F (36.7 C) (Oral)   Resp 16   SpO2 95%  Physical Exam Vitals and nursing note reviewed.  Constitutional:      General: She is not in acute distress.    Appearance: She is well-developed.  HENT:     Head: Normocephalic and atraumatic.     Mouth/Throat:     Mouth: Mucous membranes are moist.  Eyes:     General: Vision grossly intact. Gaze aligned appropriately.     Extraocular Movements: Extraocular movements intact.     Conjunctiva/sclera: Conjunctivae normal.  Cardiovascular:     Rate and Rhythm: Normal rate and regular rhythm.  Pulses: Normal pulses.     Heart sounds: Normal heart sounds, S1 normal and S2 normal. No murmur heard.    No friction rub. No gallop.  Pulmonary:     Effort: Pulmonary effort is normal. No respiratory distress.     Breath sounds: Normal breath sounds.  Abdominal:     General: Bowel sounds are normal.     Palpations: Abdomen is soft.     Tenderness: There is no abdominal tenderness. There is no guarding or rebound.     Hernia: No hernia is present.  Musculoskeletal:        General: No swelling.     Cervical back: Full passive range of motion without pain, normal range of motion and neck supple. No spinous process tenderness or muscular tenderness. Normal range of motion.      Right lower leg: No edema.     Left lower leg: No edema.  Skin:    General: Skin is warm and dry.     Capillary Refill: Capillary refill takes less than 2 seconds.     Findings: No ecchymosis, erythema, rash or wound.  Neurological:     General: No focal deficit present.     Mental Status: She is alert and oriented to person, place, and time.     GCS: GCS eye subscore is 4. GCS verbal subscore is 5. GCS motor subscore is 6.     Cranial Nerves: Cranial nerves 2-12 are intact.     Sensory: Sensation is intact.     Motor: Motor function is intact.     Coordination: Coordination is intact.  Psychiatric:        Attention and Perception: Attention normal.        Mood and Affect: Mood normal.        Speech: Speech normal.        Behavior: Behavior normal.     ED Results / Procedures / Treatments   Labs (all labs ordered are listed, but only abnormal results are displayed) Labs Reviewed  CBC  BASIC METABOLIC PANEL WITH GFR    EKG None  Radiology No results found.  Procedures Procedures  {Document cardiac monitor, telemetry assessment procedure when appropriate:1}  Medications Ordered in ED Medications  labetalol  (NORMODYNE ) injection 10 mg (has no administration in time range)    ED Course/ Medical Decision Making/ A&P   {   Click here for ABCD2, HEART and other calculatorsREFRESH Note before signing :1}                              Medical Decision Making Amount and/or Complexity of Data Reviewed Labs: ordered. Radiology: ordered.  Risk Prescription drug management.   ***  {Document critical care time when appropriate:1} {Document review of labs and clinical decision tools ie heart score, Chads2Vasc2 etc:1}  {Document your independent review of radiology images, and any outside records:1} {Document your discussion with family members, caretakers, and with consultants:1} {Document social determinants of health affecting pt's care:1} {Document your decision  making why or why not admission, treatments were needed:1} Final Clinical Impression(s) / ED Diagnoses Final diagnoses:  None    Rx / DC Orders ED Discharge Orders     None

## 2024-05-04 NOTE — ED Triage Notes (Addendum)
 Per son pt BP was 199/88 at home, was worried it was too high so he gave her of dose a Valium . States she has an appointment tomorrow with PCP but he wanted to get her BP "under control" tonight.   Pts only complaint is occasional headache.

## 2024-05-05 DIAGNOSIS — I10 Essential (primary) hypertension: Secondary | ICD-10-CM | POA: Diagnosis not present

## 2024-05-05 LAB — CBC
HCT: 26.2 % — ABNORMAL LOW (ref 36.0–46.0)
Hemoglobin: 8.6 g/dL — ABNORMAL LOW (ref 12.0–15.0)
MCH: 30.6 pg (ref 26.0–34.0)
MCHC: 32.8 g/dL (ref 30.0–36.0)
MCV: 93.2 fL (ref 80.0–100.0)
Platelets: 150 10*3/uL (ref 150–400)
RBC: 2.81 MIL/uL — ABNORMAL LOW (ref 3.87–5.11)
RDW: 14.9 % (ref 11.5–15.5)
WBC: 8 10*3/uL (ref 4.0–10.5)
nRBC: 0 % (ref 0.0–0.2)

## 2024-05-05 LAB — BASIC METABOLIC PANEL WITH GFR
Anion gap: 8 (ref 5–15)
BUN: 21 mg/dL (ref 8–23)
CO2: 23 mmol/L (ref 22–32)
Calcium: 7.5 mg/dL — ABNORMAL LOW (ref 8.9–10.3)
Chloride: 109 mmol/L (ref 98–111)
Creatinine, Ser: 1.14 mg/dL — ABNORMAL HIGH (ref 0.44–1.00)
GFR, Estimated: 45 mL/min — ABNORMAL LOW (ref >60)
Glucose, Bld: 102 mg/dL — ABNORMAL HIGH (ref 70–99)
Potassium: 3.3 mmol/L — ABNORMAL LOW (ref 3.5–5.1)
Sodium: 140 mmol/L (ref 135–145)

## 2024-05-05 MED ORDER — CLONIDINE HCL 0.1 MG PO TABS
0.1000 mg | ORAL_TABLET | Freq: Once | ORAL | Status: AC
  Administered 2024-05-05: 0.1 mg via ORAL
  Filled 2024-05-05: qty 1

## 2024-05-05 MED ORDER — CLONIDINE HCL 0.1 MG PO TABS
0.1000 mg | ORAL_TABLET | Freq: Two times a day (BID) | ORAL | 0 refills | Status: DC | PRN
Start: 2024-05-05 — End: 2024-05-11

## 2024-05-06 DIAGNOSIS — I10 Essential (primary) hypertension: Secondary | ICD-10-CM | POA: Diagnosis not present

## 2024-05-06 DIAGNOSIS — E039 Hypothyroidism, unspecified: Secondary | ICD-10-CM | POA: Diagnosis not present

## 2024-05-08 ENCOUNTER — Telehealth: Payer: Self-pay | Admitting: Cardiology

## 2024-05-08 NOTE — Telephone Encounter (Signed)
 Pt c/o swelling/edema: STAT if pt has developed SOB within 24 hours  If swelling, where is the swelling located? Legs   How much weight have you gained and in what time span? States she did not gain   Have you gained 2 pounds in a day or 5 pounds in a week? No   Do you have a log of your daily weights (if so, list)? Did not record   Are you currently taking a fluid pill? No   Are you currently SOB? No   Have you traveled recently in a car or plane for an extended period of time? No

## 2024-05-08 NOTE — Telephone Encounter (Signed)
 Son confirmed apt for Monday

## 2024-05-08 NOTE — Telephone Encounter (Signed)
 Left message with both son and daughter,Lori, asking them to call back to confirm appointment that was made for patient on 05/11/24 at 3 pm with Cadence Furth,PA-C who saw patient in October for same issue.

## 2024-05-09 ENCOUNTER — Observation Stay (HOSPITAL_COMMUNITY)

## 2024-05-09 ENCOUNTER — Emergency Department (HOSPITAL_COMMUNITY)

## 2024-05-09 ENCOUNTER — Other Ambulatory Visit: Payer: Self-pay

## 2024-05-09 ENCOUNTER — Inpatient Hospital Stay (HOSPITAL_COMMUNITY)
Admission: EM | Admit: 2024-05-09 | Discharge: 2024-05-11 | DRG: 092 | Disposition: A | Discharge status: Home / Self Care | Attending: Internal Medicine | Admitting: Internal Medicine

## 2024-05-09 DIAGNOSIS — R1319 Other dysphagia: Secondary | ICD-10-CM | POA: Diagnosis not present

## 2024-05-09 DIAGNOSIS — R2990 Unspecified symptoms and signs involving the nervous system: Secondary | ICD-10-CM | POA: Diagnosis present

## 2024-05-09 DIAGNOSIS — Z90710 Acquired absence of both cervix and uterus: Secondary | ICD-10-CM | POA: Diagnosis not present

## 2024-05-09 DIAGNOSIS — E785 Hyperlipidemia, unspecified: Secondary | ICD-10-CM | POA: Diagnosis present

## 2024-05-09 DIAGNOSIS — E890 Postprocedural hypothyroidism: Secondary | ICD-10-CM | POA: Diagnosis present

## 2024-05-09 DIAGNOSIS — Z79899 Other long term (current) drug therapy: Secondary | ICD-10-CM | POA: Diagnosis not present

## 2024-05-09 DIAGNOSIS — R1312 Dysphagia, oropharyngeal phase: Secondary | ICD-10-CM | POA: Diagnosis not present

## 2024-05-09 DIAGNOSIS — Z803 Family history of malignant neoplasm of breast: Secondary | ICD-10-CM

## 2024-05-09 DIAGNOSIS — R55 Syncope and collapse: Secondary | ICD-10-CM | POA: Diagnosis not present

## 2024-05-09 DIAGNOSIS — G8929 Other chronic pain: Secondary | ICD-10-CM | POA: Diagnosis present

## 2024-05-09 DIAGNOSIS — N179 Acute kidney failure, unspecified: Secondary | ICD-10-CM | POA: Diagnosis present

## 2024-05-09 DIAGNOSIS — N1832 Chronic kidney disease, stage 3b: Secondary | ICD-10-CM | POA: Diagnosis present

## 2024-05-09 DIAGNOSIS — Z833 Family history of diabetes mellitus: Secondary | ICD-10-CM | POA: Diagnosis not present

## 2024-05-09 DIAGNOSIS — R29810 Facial weakness: Secondary | ICD-10-CM | POA: Diagnosis not present

## 2024-05-09 DIAGNOSIS — I639 Cerebral infarction, unspecified: Principal | ICD-10-CM

## 2024-05-09 DIAGNOSIS — Z96651 Presence of right artificial knee joint: Secondary | ICD-10-CM | POA: Diagnosis present

## 2024-05-09 DIAGNOSIS — I6782 Cerebral ischemia: Secondary | ICD-10-CM | POA: Diagnosis not present

## 2024-05-09 DIAGNOSIS — J4489 Other specified chronic obstructive pulmonary disease: Secondary | ICD-10-CM | POA: Diagnosis present

## 2024-05-09 DIAGNOSIS — R29707 NIHSS score 7: Secondary | ICD-10-CM

## 2024-05-09 DIAGNOSIS — Z888 Allergy status to other drugs, medicaments and biological substances status: Secondary | ICD-10-CM

## 2024-05-09 DIAGNOSIS — Z8673 Personal history of transient ischemic attack (TIA), and cerebral infarction without residual deficits: Secondary | ICD-10-CM

## 2024-05-09 DIAGNOSIS — J449 Chronic obstructive pulmonary disease, unspecified: Secondary | ICD-10-CM | POA: Diagnosis present

## 2024-05-09 DIAGNOSIS — Z602 Problems related to living alone: Secondary | ICD-10-CM | POA: Diagnosis present

## 2024-05-09 DIAGNOSIS — D72829 Elevated white blood cell count, unspecified: Secondary | ICD-10-CM | POA: Diagnosis not present

## 2024-05-09 DIAGNOSIS — K219 Gastro-esophageal reflux disease without esophagitis: Secondary | ICD-10-CM | POA: Diagnosis not present

## 2024-05-09 DIAGNOSIS — Z8249 Family history of ischemic heart disease and other diseases of the circulatory system: Secondary | ICD-10-CM

## 2024-05-09 DIAGNOSIS — I10 Essential (primary) hypertension: Secondary | ICD-10-CM | POA: Diagnosis present

## 2024-05-09 DIAGNOSIS — Z66 Do not resuscitate: Secondary | ICD-10-CM | POA: Diagnosis present

## 2024-05-09 DIAGNOSIS — R918 Other nonspecific abnormal finding of lung field: Secondary | ICD-10-CM | POA: Diagnosis not present

## 2024-05-09 DIAGNOSIS — I6523 Occlusion and stenosis of bilateral carotid arteries: Secondary | ICD-10-CM | POA: Diagnosis not present

## 2024-05-09 DIAGNOSIS — M549 Dorsalgia, unspecified: Secondary | ICD-10-CM | POA: Diagnosis present

## 2024-05-09 DIAGNOSIS — I129 Hypertensive chronic kidney disease with stage 1 through stage 4 chronic kidney disease, or unspecified chronic kidney disease: Secondary | ICD-10-CM | POA: Diagnosis present

## 2024-05-09 DIAGNOSIS — Z87891 Personal history of nicotine dependence: Secondary | ICD-10-CM

## 2024-05-09 DIAGNOSIS — E860 Dehydration: Secondary | ICD-10-CM | POA: Diagnosis not present

## 2024-05-09 DIAGNOSIS — R0602 Shortness of breath: Secondary | ICD-10-CM | POA: Diagnosis not present

## 2024-05-09 DIAGNOSIS — I700 Atherosclerosis of aorta: Secondary | ICD-10-CM | POA: Diagnosis not present

## 2024-05-09 DIAGNOSIS — E039 Hypothyroidism, unspecified: Secondary | ICD-10-CM | POA: Diagnosis not present

## 2024-05-09 DIAGNOSIS — Z79890 Hormone replacement therapy (postmenopausal): Secondary | ICD-10-CM

## 2024-05-09 DIAGNOSIS — J439 Emphysema, unspecified: Secondary | ICD-10-CM | POA: Diagnosis not present

## 2024-05-09 DIAGNOSIS — N390 Urinary tract infection, site not specified: Secondary | ICD-10-CM | POA: Diagnosis present

## 2024-05-09 DIAGNOSIS — R000 Tachycardia, unspecified: Secondary | ICD-10-CM | POA: Diagnosis not present

## 2024-05-09 DIAGNOSIS — R29818 Other symptoms and signs involving the nervous system: Principal | ICD-10-CM | POA: Diagnosis present

## 2024-05-09 DIAGNOSIS — Z96611 Presence of right artificial shoulder joint: Secondary | ICD-10-CM | POA: Diagnosis not present

## 2024-05-09 DIAGNOSIS — M7989 Other specified soft tissue disorders: Secondary | ICD-10-CM | POA: Diagnosis not present

## 2024-05-09 DIAGNOSIS — M79604 Pain in right leg: Secondary | ICD-10-CM | POA: Diagnosis not present

## 2024-05-09 DIAGNOSIS — R531 Weakness: Secondary | ICD-10-CM | POA: Diagnosis not present

## 2024-05-09 DIAGNOSIS — G319 Degenerative disease of nervous system, unspecified: Secondary | ICD-10-CM | POA: Diagnosis not present

## 2024-05-09 LAB — CBC
HCT: 31.6 % — ABNORMAL LOW (ref 36.0–46.0)
Hemoglobin: 10.1 g/dL — ABNORMAL LOW (ref 12.0–15.0)
MCH: 29.4 pg (ref 26.0–34.0)
MCHC: 32 g/dL (ref 30.0–36.0)
MCV: 92.1 fL (ref 80.0–100.0)
Platelets: 138 10*3/uL — ABNORMAL LOW (ref 150–400)
RBC: 3.43 MIL/uL — ABNORMAL LOW (ref 3.87–5.11)
RDW: 15.9 % — ABNORMAL HIGH (ref 11.5–15.5)
WBC: 17.2 10*3/uL — ABNORMAL HIGH (ref 4.0–10.5)
nRBC: 0 % (ref 0.0–0.2)

## 2024-05-09 LAB — COMPREHENSIVE METABOLIC PANEL WITH GFR
ALT: 16 U/L (ref 0–44)
AST: 36 U/L (ref 15–41)
Albumin: 2.9 g/dL — ABNORMAL LOW (ref 3.5–5.0)
Alkaline Phosphatase: 94 U/L (ref 38–126)
Anion gap: 10 (ref 5–15)
BUN: 35 mg/dL — ABNORMAL HIGH (ref 8–23)
CO2: 21 mmol/L — ABNORMAL LOW (ref 22–32)
Calcium: 8.5 mg/dL — ABNORMAL LOW (ref 8.9–10.3)
Chloride: 102 mmol/L (ref 98–111)
Creatinine, Ser: 2.61 mg/dL — ABNORMAL HIGH (ref 0.44–1.00)
GFR, Estimated: 17 mL/min — ABNORMAL LOW (ref >60)
Glucose, Bld: 93 mg/dL (ref 70–99)
Potassium: 3.8 mmol/L (ref 3.5–5.1)
Sodium: 133 mmol/L — ABNORMAL LOW (ref 135–145)
Total Bilirubin: 1.1 mg/dL (ref 0.0–1.2)
Total Protein: 6.6 g/dL (ref 6.5–8.1)

## 2024-05-09 LAB — URINALYSIS, ROUTINE W REFLEX MICROSCOPIC
Bilirubin Urine: NEGATIVE
Glucose, UA: NEGATIVE mg/dL
Ketones, ur: NEGATIVE mg/dL
Nitrite: NEGATIVE
Protein, ur: 100 mg/dL — AB
RBC / HPF: >50 RBC/hpf (ref 0–5)
Specific Gravity, Urine: 1.017 (ref 1.005–1.030)
WBC, UA: >50 WBC/hpf (ref 0–5)
pH: 5 (ref 5.0–8.0)

## 2024-05-09 LAB — RAPID URINE DRUG SCREEN, HOSP PERFORMED
Amphetamines: NOT DETECTED
Barbiturates: NOT DETECTED
Benzodiazepines: POSITIVE — AB
Cocaine: NOT DETECTED
Opiates: NOT DETECTED
Tetrahydrocannabinol: NOT DETECTED

## 2024-05-09 LAB — DIFFERENTIAL
Abs Immature Granulocytes: 0.15 10*3/uL — ABNORMAL HIGH (ref 0.00–0.07)
Basophils Absolute: 0.1 10*3/uL (ref 0.0–0.1)
Basophils Relative: 0 %
Eosinophils Absolute: 0 10*3/uL (ref 0.0–0.5)
Eosinophils Relative: 0 %
Immature Granulocytes: 1 %
Lymphocytes Relative: 2 %
Lymphs Abs: 0.3 10*3/uL — ABNORMAL LOW (ref 0.7–4.0)
Monocytes Absolute: 0.7 10*3/uL (ref 0.1–1.0)
Monocytes Relative: 4 %
Neutro Abs: 15.5 10*3/uL — ABNORMAL HIGH (ref 1.7–7.7)
Neutrophils Relative %: 93 %

## 2024-05-09 LAB — VITAMIN B12: Vitamin B-12: 450 pg/mL (ref 180–914)

## 2024-05-09 LAB — APTT: aPTT: 37 s — ABNORMAL HIGH (ref 24–36)

## 2024-05-09 LAB — ETHANOL: Alcohol, Ethyl (B): <15 mg/dL (ref <15)

## 2024-05-09 LAB — CBG MONITORING, ED: Glucose-Capillary: 77 mg/dL (ref 70–99)

## 2024-05-09 LAB — PROTIME-INR
INR: 1.4 — ABNORMAL HIGH (ref 0.8–1.2)
Prothrombin Time: 17.6 s — ABNORMAL HIGH (ref 11.4–15.2)

## 2024-05-09 LAB — TSH: TSH: 1.466 u[IU]/mL (ref 0.350–4.500)

## 2024-05-09 MED ORDER — ACETAMINOPHEN 650 MG RE SUPP: 650.0000 mg | RECTAL | Status: DC | PRN

## 2024-05-09 MED ORDER — SODIUM CHLORIDE 0.9 % IV BOLUS
1000.0000 mL | Freq: Once | INTRAVENOUS | Status: AC
  Administered 2024-05-09: 1000 mL via INTRAVENOUS

## 2024-05-09 MED ORDER — FLUTICASONE PROPIONATE 50 MCG/ACT NA SUSP
2.0000 | Freq: Every day | NASAL | Status: DC
  Administered 2024-05-10 – 2024-05-11 (×2): 2 via NASAL
  Filled 2024-05-09: qty 16

## 2024-05-09 MED ORDER — IPRATROPIUM-ALBUTEROL 0.5-2.5 (3) MG/3ML IN SOLN: 3.0000 mL | Freq: Four times a day (QID) | RESPIRATORY_TRACT | Status: DC | PRN

## 2024-05-09 MED ORDER — ACETAMINOPHEN 160 MG/5ML PO SOLN: 650.0000 mg | ORAL | Status: DC | PRN

## 2024-05-09 MED ORDER — PANTOPRAZOLE SODIUM 40 MG PO TBEC
40.0000 mg | DELAYED_RELEASE_TABLET | Freq: Two times a day (BID) | ORAL | Status: DC
  Administered 2024-05-10 – 2024-05-11 (×3): 40 mg via ORAL
  Filled 2024-05-09 (×3): qty 1

## 2024-05-09 MED ORDER — HEPARIN SODIUM (PORCINE) 5000 UNIT/ML IJ SOLN
5000.0000 [IU] | Freq: Three times a day (TID) | INTRAMUSCULAR | Status: DC
  Administered 2024-05-09 – 2024-05-11 (×5): 5000 [IU] via SUBCUTANEOUS
  Filled 2024-05-09 (×5): qty 1

## 2024-05-09 MED ORDER — SODIUM CHLORIDE 0.9 % IV SOLN
1.0000 g | Freq: Every day | INTRAVENOUS | Status: DC
  Administered 2024-05-09 – 2024-05-10 (×2): 1 g via INTRAVENOUS
  Filled 2024-05-09 (×2): qty 10

## 2024-05-09 MED ORDER — LEVOTHYROXINE SODIUM 88 MCG PO TABS: 44.0000 ug | ORAL_TABLET | ORAL | Status: DC

## 2024-05-09 MED ORDER — HYDRALAZINE HCL 20 MG/ML IJ SOLN: 10.0000 mg | Freq: Three times a day (TID) | INTRAMUSCULAR | Status: DC | PRN

## 2024-05-09 MED ORDER — ACETAMINOPHEN 325 MG PO TABS: 650.0000 mg | ORAL_TABLET | ORAL | Status: DC | PRN

## 2024-05-09 MED ORDER — STROKE: EARLY STAGES OF RECOVERY BOOK
Freq: Once | Status: AC
  Filled 2024-05-09: qty 1

## 2024-05-09 MED ORDER — LEVOTHYROXINE SODIUM 88 MCG PO TABS
88.0000 ug | ORAL_TABLET | ORAL | Status: DC
  Administered 2024-05-11: 88 ug via ORAL
  Filled 2024-05-09: qty 1

## 2024-05-09 MED ORDER — SODIUM CHLORIDE 0.9 % IV SOLN: INTRAVENOUS | Status: AC

## 2024-05-09 MED ORDER — ATORVASTATIN CALCIUM 40 MG PO TABS
40.0000 mg | ORAL_TABLET | Freq: Every day | ORAL | Status: DC
  Administered 2024-05-10: 40 mg via ORAL
  Filled 2024-05-09: qty 1

## 2024-05-09 MED ORDER — ONDANSETRON HCL 4 MG/2ML IJ SOLN: 4.0000 mg | Freq: Four times a day (QID) | INTRAMUSCULAR | Status: DC | PRN

## 2024-05-09 NOTE — Assessment & Plan Note (Signed)
-   Patient with oropharyngeal dysphagia at baseline requiring dysphagia 1 diet with thin liquids - Will assess bedside swallowing and place adequate orders if she passes test.

## 2024-05-09 NOTE — Assessment & Plan Note (Signed)
-  TSH within normal limits. -Continue Synthroid at current dose.

## 2024-05-09 NOTE — Assessment & Plan Note (Signed)
-   No hemorrhagic stroke appreciated on CT scan - Patient will be admitted to telemetry bed and will check MRI, 2D echo and carotid Dopplers -TSH, B12, lipid panel and A1c - Speech therapy, PT/OT evaluation requested -Allowing for permissive hypertension and looking for dual antiplatelet therapy for 21 days with subsequent aspirin  on daily basis for secondary prevention past that time - Will follow any further recommendation by neurology service - Continue supportive care and follow clinical response.

## 2024-05-09 NOTE — ED Triage Notes (Signed)
 Pt arrived via [facility] from home c/o stroke like Sx, LKW 10:30pm last night, c/o L sided weakness and L sided leaning, cbg 77 EDP at bedside

## 2024-05-09 NOTE — ED Notes (Signed)
 Pt arrived to the ED at 1222 and wen to CT at 1224

## 2024-05-09 NOTE — Assessment & Plan Note (Signed)
-  Update lipid panel -Continue statin. 

## 2024-05-09 NOTE — Consult Note (Signed)
 NEUROLOGY TELESTROKE CONSULT NOTE   Date of service: May 09, 2024 Patient Name: Christine Leblanc MRN:  [id number] DOB:  September 27, 1929 Chief Complaint: stroke code Requesting Provider: Cheyenne Cotta, MD  Consult Participants: myself, patient, bedside RN, telestroke RN Location of the provider: [facility] Location of the patient: [facility]  This consult was provided via telemedicine with 2-way video and audio communication. The patient/family was informed that care would be provided in this way and agreed to receive care in this manner.   History of Present Illness   This is a 89 yo woman with hypertension and hyperlipidemia who is BIB EMS for L sided weakness. Patient is 89 yo but highly functional and lives by herself. Son checks in on her daily, last known well 10:30pm last night when he went to visit her and she was normal. When he checked on her around noon he reported new onset L sided weakness and leaning to the L and called EMS. On my examination she is mildly lethargic but oriented x3 and to age. She is not able to provide history or tell us  when her sx started. She has mild L facial droop and mild drift in LUE and LLE as well as L sided sensory deficit. NIHSS = 7 for lethargy, left facial droop, left upper extremity drift, left lower extremity drift, left sensory deficit, mild dysarthria, and questionable expressive aphasia although this may have been secondary to her confusion.  CT head showed no acute process on personal review.  Patient was not eligible for TNK due to presentation outside the window.  Exam was not consistent with large vessel occlusion and therefore CTA was not performed as part of stroke code.  Of note per chart she has a very remote history of seizures as a child and has not had any seizures off medication for the last 60 years.  LKW: 10:30pm last night  Modified rankin score: 2-Slight disability-UNABLE to perform all activities but does not need assistance IV Thrombolysis: no  outside window  NIHSS components Score: Comment  1a Level of Conscious 0[]  1[x]  2[]  3[]      1b LOC Questions 0[x]  1[]  2[]       1c LOC Commands 0[x]  1[]  2[]       2 Best Gaze 0[x]  1[]  2[]       3 Visual 0[x]  1[]  2[]  3[]      4 Facial Palsy 0[]  1[x]  2[]  3[]      5a Motor Arm - left 0[]  1[x]  2[]  3[]  4[]  UN[]    5b Motor Arm - Right 0[x]  1[]  2[]  3[]  4[]  UN[]    6a Motor Leg - Left 0[]  1[x]  2[]  3[]  4[]  UN[]    6b Motor Leg - Right 0[x]  1[]  2[]  3[]  4[]  UN[]    7 Limb Ataxia 0[x]  1[]  2[]  3[]  UN[]     8 Sensory 0[]  1[x]  2[]  UN[]      9 Best Language 0[]  1[x]  2[]  3[]      10 Dysarthria 0[]  1[x]  2[]  UN[]      11 Extinct. and Inattention 0[x]  1[]  2[]       TOTAL:       ROS  Unable to ascertain due confusion  Past History   Past Medical History:  Diagnosis Date   Anemia    Asthma    Baker's cyst    Cancer (HCC)    skin Ca- ? basal cell    Chronic back pain    Chronic neck pain    Degenerative joint disease    Left shoulder; cervical spine, knees & hands  Gastroesophageal reflux disease    Hiatal hernia; distal esophageal web requiring dilatation; gastric polyps; gastritis; refuses colonoscopy   GERD (gastroesophageal reflux disease)    History of stress test 1990's   stress test done under the care of Dr. Macdonald Savoy & Dr. Katheryne Pane, now being followed by Dr. Amanda Jungling- in [place] , recently seen & told to f/U in one yr.    Hyperlipidemia 04/27/2011   Hypertension    Hypothyroidism    Lymphocytic thyroiditis    Mild carotid artery disease (HCC)    Multiple thyroid  nodules 2010   Adenomatous; thyroidectomy in 2010   Orthostasis    Osteoarthritis of right knee 07/27/2014   Pneumonia    hosp. for pneumonia- long time ago    Primary localized osteoarthrosis of right shoulder 06/21/2015   Seizures (HCC)    yes- as a child- & into adult years, states she took med. for them at one time, stopped at 32 yrs. of age    Syncope and collapse 2007   Possible CVA in 2012 with left lower extremity  weakness; refused hospitalization; CT-Atrophy and chronic microvascular ischemic change.     Past Surgical History:  Procedure Laterality Date   ABDOMINAL HYSTERECTOMY     fibroids   CATARACT EXTRACTION     Bilateral; redo surgery on the right for incomplete primary procedure   COLONOSCOPY  Remote   ESOPHAGOGASTRODUODENOSCOPY  11/2009   Dr. Nolene Baumgarten: probable distal web s/p dilation small hiatal hernia/gastric polyps/mild gastritis, appeared to have narrowing at the junction of D1 and D2 dilated up to 12 mm   ESOPHAGOGASTRODUODENOSCOPY N/A 03/21/2015   Surgeon: Alyce Jubilee, MD; proximal esophageal web s/p dilation, multiple gastric polyps s/p multiple biopsies, mild erosive gastritis s/p biopsy.  Pathology with fundic gland polyps, chronic gastritis, negative for H. pylori.   EYE SURGERY     PROLAPSED UTERINE FIBROID LIGATION     outcomed with rectocele & cystocele   SAVORY DILATION N/A 03/21/2015   Procedure: SAVORY DILATION;  Surgeon: Alyce Jubilee, MD;  Location: [facility];  Service: Endoscopy;  Laterality: N/A;   SHOULDER SURGERY     left   TOTAL KNEE ARTHROPLASTY Right 07/27/2014   Procedure: RIGHT TOTAL KNEE ARTHROPLASTY;  Surgeon: Neville Barbone, MD;  Location: [facility] OR;  Service: Orthopedics;  Laterality: Right;   TOTAL SHOULDER ARTHROPLASTY Right 06/21/2015   Procedure: RIGHT TOTAL SHOULDER ARTHROPLASTY;  Surgeon: Osa Blase, MD;  Location: [facility] OR;  Service: Orthopedics;  Laterality: Right;   TOTAL THYROIDECTOMY  2010    Family History: Family History  Problem Relation Age of Onset   Heart disease Mother    Gallbladder disease Mother    Heart failure Mother    Diabetes Mother    Breast cancer Sister        30 years ago   Diabetes Sister    Heart attack Father    Diabetes Maternal Grandmother    Colon cancer Neg Hx    Stroke Neg Hx     Social History  reports that she quit smoking about 45 years ago. Her smoking use included cigarettes. She started smoking  about 65 years ago. She has a 16 pack-year smoking history. She has never used smokeless tobacco. She reports that she does not currently use alcohol. She reports that she does not use drugs.  Allergies  Allergen Reactions   Celecoxib Shortness Of Breath   Dexlansoprazole  Anaphylaxis and Other (See Comments)    abd pain   Famotidine Other (  See Comments)    Makes her feel "very bad"    Medications  No current facility-administered medications for this encounter.  Current Outpatient Medications:    atorvastatin  (LIPITOR) 40 MG tablet, Take 1 tablet (40 mg total) by mouth at bedtime., Disp: 30 tablet, Rfl: 3   Carbinoxamine  Maleate 4 MG TABS, Take 1 tablet (4 mg total) by mouth at bedtime., Disp: 90 tablet, Rfl: 4   cloNIDine  (CATAPRES ) 0.1 MG tablet, Take 1 tablet (0.1 mg total) by mouth 2 (two) times daily as needed (blood pressure above 160)., Disp: 15 tablet, Rfl: 0   diazepam  (VALIUM ) 5 MG tablet, Take 2.5 mg by mouth daily as needed for anxiety., Disp: , Rfl:    ferrous sulfate  325 (65 FE) MG tablet, Take 325 mg by mouth. Takes one on Monday, Wednesday and Friday, Disp: , Rfl:    fluticasone  (FLONASE ) 50 MCG/ACT nasal spray, Place 2 sprays into both nostrils daily., Disp: 16 g, Rfl: 5   ipratropium (ATROVENT ) 0.06 % nasal spray, Place 1 spray into both nostrils 4 (four) times daily as needed for rhinitis., Disp: 15 mL, Rfl: 5   levothyroxine  (SYNTHROID ) 88 MCG tablet, TAKE 1 TABLET DAILY ON MONDAY THROUGH SATURDAY AND ONE-HALF (1/2) TABLET ON SUNDAY (Patient taking differently: Take 88 mcg by mouth See admin instructions. TAKE 1 TABLET DAILY ON MONDAY THROUGH SATURDAY AND ONE-HALF (1/2) TABLET ON SUNDAY), Disp: 90 tablet, Rfl: 3   pantoprazole  (PROTONIX ) 40 MG tablet, Take 1 tablet (40 mg total) by mouth 2 (two) times daily before a meal., Disp: 180 tablet, Rfl: 3   potassium chloride  SA (KLOR-CON  M) 20 MEQ tablet, Take 1 tablet (20 mEq total) by mouth 2 (two) times daily., Disp: 10  tablet, Rfl: 0   senna (SENOKOT) 8.6 MG tablet, Take 2 tablets by mouth at bedtime., Disp: , Rfl:    sodium chloride  0.9 % nebulizer solution, Take 3 mLs by nebulization daily as needed for wheezing., Disp: 30 mL, Rfl: 0  Vitals  There were no vitals filed for this visit.  There is no height or weight on file to calculate BMI.  Physical Exam   Gen: NAD Resp: normal WOB CV: extremities appear well-perfused  Neurologic Examination   *MS: mildly lethargic, O x3 and to age but becomes confused when providing history. Follows single step commands *Speech: mildly dysarthric, mild-to-moderate expressive aphasia *CN: PERRL 3mm, EOMI, blinks to threat bilat, sensation impaired on L, smile symmetric, hearing intact to voice *Motor:   Normal bulk.  No tremor, rigidity or bradykinesia. Mild drift but not ot bed LUE and LLE. No drift RUE and RLE.  *Sensory: Sensory impairment to L side throughout *Coordination:  No ataxia on FNF bilat *Reflexes:  UTA 2/2 tele-exam *Gait: deferred  Labs/Imaging/Neurodiagnostic studies   CBC:  Recent Labs  Lab 05-27-24 0107  WBC 8.0  HGB 8.6*  HCT 26.2*  MCV 93.2  PLT 150   Basic Metabolic Panel:  Lab Results  Component Value Date   NA 140 05/27/24   K 3.3 (L) 2024-05-27   CO2 23 05-27-24   GLUCOSE 102 (H) 05/27/24   BUN 21 05-27-24   CREATININE 1.14 (H) May 27, 2024   CALCIUM  7.5 (L) 05/27/2024   GFRNONAA 45 (L) 05-27-2024   GFRAA 49 (L) 05/20/2020   Lipid Panel:  Lab Results  Component Value Date   LDLCALC 87 01/07/2024   HgbA1c:  Lab Results  Component Value Date   HGBA1C 5.4 01/06/2024   Urine Drug Screen: No  results found for: "LABOPIA", "COCAINSCRNUR", "LABBENZ", "AMPHETMU", "THCU", "LABBARB"  Alcohol Level No results found for: "ETH" INR  Lab Results  Component Value Date   INR 1.2 03/04/2024   APTT  Lab Results  Component Value Date   APTT 32 03/04/2024   AED levels: No results found for: "PHENYTOIN",  "ZONISAMIDE", "LAMOTRIGINE", "LEVETIRACETA"  CT Head without contrast(Personally reviewed): No acute process on personal review  ASSESSMENT   This is a 89 yo woman with hypertension and hyperlipidemia who is BIB EMS for mild L sided weakness face/arm/leg, left sided sensory deficit, mild dysarthria. She was outside the window for TNK. CT head showed no acute process. Exam not c/w LVO therefore CTA not performed during the telestroke.  Sx are c/f acute ischemic stroke, recommend admission to [facility] for further workup  RECOMMENDATIONS   - Admit for stroke workup to [facility] - Permissive HTN x48 hrs from sx onset or until stroke ruled out by MRI goal BP <220/120. PRN labetalol  or hydralazine  if BP above these parameters. Avoid oral antihypertensives. - MRI brain wo contrast - CTA or MRA H&N - TTE - Check A1c and LDL + add statin per guidelines - ASA 81mg  daily + plavix  75mg  daily x21 days f/b ASA 81mg  daily monotherapy after that - Bedside RN dysphagia screen before taking po - q4 hr neuro checks - STAT head CT for any change in neuro exam - Tele - PT/OT/SLP - Stroke education - Amb referral to neurology upon discharge   Please contact me with any questions [phone] ______________________________________________________________________    Signed, Eleni Griffin, MD [facility]

## 2024-05-09 NOTE — ED Notes (Signed)
 Attempted to call report, was told pt needed more review and nurse they would call back when they were ready

## 2024-05-09 NOTE — Assessment & Plan Note (Signed)
-   Stable and not demonstrating acute exacerbation - Continue as needed bronchodilator management.

## 2024-05-09 NOTE — H&P (Signed)
 History and Physical    Patient: Christine Leblanc:[id number] DOB: 1929-05-10 DOA: 05/09/2024 DOS: the patient was seen and examined on 05/09/2024 PCP: Omie Bickers, MD  Patient coming from: Home  Chief Complaint:  Chief Complaint  Patient presents with   Code Stroke   HPI: [name] is a 89 y.o. female with medical history significant of history of asthma/COPD, chronic kidney disease stage IIIb, hypothyroidism, GERD, hypertension, hyperlipidemia, history of TIA in the past and degenerative joint disease; who presented to the hospital secondary to left-sided weakness and dysarthria.  Patient was last seen normal around 10:30 PM by her some on 05/08/2024.  When he came to the house this morning noticed patient was falling to the left having mild dysarthria and demonstrating difficulty moving her left upper extremity.  There was also presence of left facial droop (mild). No complaints of seizure activity, chest pain, fever, nausea, vomiting, dysuria, hematuria, melena/hematochezia, shortness of breath or any other complaints.  Workup in the ED demonstrating CT scan of the head negative for acute intracranial normalities and basic metabolic panel demonstrating acute kidney injury and chronic kidney disease stage IIIb.  Case discussed with neurology service through telemedicine who recommended admission for stroke workup; on neurologic examination there was no concern for large vessel occlusion and given worsening renal function contrast will be avoided.  Permissive hypertension, MRI, echo, lipid panel and A1c recommended.  [facility] has been contacted to place patient in the hospital for further evaluation and management.  Review of Systems: As mentioned in the history of present illness. All other systems reviewed and are negative. Past Medical History:  Diagnosis Date   Anemia    Asthma    Baker's cyst    Cancer (HCC)    skin Ca- ? basal cell    Chronic back pain    Chronic neck pain     Degenerative joint disease    Left shoulder; cervical spine, knees & hands    Gastroesophageal reflux disease    Hiatal hernia; distal esophageal web requiring dilatation; gastric polyps; gastritis; refuses colonoscopy   GERD (gastroesophageal reflux disease)    History of stress test 1990's   stress test done under the care of Dr. Macdonald Savoy & Dr. Katheryne Pane, now being followed by Dr. Amanda Jungling- in [place] , recently seen & told to f/U in one yr.    Hyperlipidemia 04/27/2011   Hypertension    Hypothyroidism    Lymphocytic thyroiditis    Mild carotid artery disease (HCC)    Multiple thyroid  nodules 2010   Adenomatous; thyroidectomy in 2010   Orthostasis    Osteoarthritis of right knee 07/27/2014   Pneumonia    hosp. for pneumonia- long time ago    Primary localized osteoarthrosis of right shoulder 06/21/2015   Seizures (HCC)    yes- as a child- & into adult years, states she took med. for them at one time, stopped at 32 yrs. of age    Syncope and collapse 2007   Possible CVA in 2012 with left lower extremity weakness; refused hospitalization; CT-Atrophy and chronic microvascular ischemic change.    Past Surgical History:  Procedure Laterality Date   ABDOMINAL HYSTERECTOMY     fibroids   CATARACT EXTRACTION     Bilateral; redo surgery on the right for incomplete primary procedure   COLONOSCOPY  Remote   ESOPHAGOGASTRODUODENOSCOPY  11/2009   Dr. Nolene Baumgarten: probable distal web s/p dilation small hiatal hernia/gastric polyps/mild gastritis, appeared to have narrowing at the junction of  D1 and D2 dilated up to 12 mm   ESOPHAGOGASTRODUODENOSCOPY N/A 03/21/2015   Surgeon: Alyce Jubilee, MD; proximal esophageal web s/p dilation, multiple gastric polyps s/p multiple biopsies, mild erosive gastritis s/p biopsy.  Pathology with fundic gland polyps, chronic gastritis, negative for H. pylori.   EYE SURGERY     PROLAPSED UTERINE FIBROID LIGATION     outcomed with rectocele & cystocele   SAVORY  DILATION N/A 03/21/2015   Procedure: SAVORY DILATION;  Surgeon: Alyce Jubilee, MD;  [facility];  Service: Endoscopy;  Laterality: N/A;   SHOULDER SURGERY     left   TOTAL KNEE ARTHROPLASTY Right 07/27/2014   Procedure: RIGHT TOTAL KNEE ARTHROPLASTY;  Surgeon: Neville Barbone, MD;  Location: [facility] OR;  Service: Orthopedics;  Laterality: Right;   TOTAL SHOULDER ARTHROPLASTY Right 06/21/2015   Procedure: RIGHT TOTAL SHOULDER ARTHROPLASTY;  Surgeon: Osa Blase, MD;  Location: [facility] OR;  Service: Orthopedics;  Laterality: Right;   TOTAL THYROIDECTOMY  2010   Social History:  reports that she quit smoking about 45 years ago. Her smoking use included cigarettes. She started smoking about 65 years ago. She has a 16 pack-year smoking history. She has never used smokeless tobacco. She reports that she does not currently use alcohol. She reports that she does not use drugs.  Allergies  Allergen Reactions   Celecoxib Shortness Of Breath   Dexlansoprazole  Anaphylaxis and Other (See Comments)    abd pain   Famotidine Other (See Comments)    Makes her feel "very bad"    Family History  Problem Relation Age of Onset   Heart disease Mother    Gallbladder disease Mother    Heart failure Mother    Diabetes Mother    Breast cancer Sister        30 years ago   Diabetes Sister    Heart attack Father    Diabetes Maternal Grandmother    Colon cancer Neg Hx    Stroke Neg Hx     Prior to Admission medications   Medication Sig Start Date End Date Taking? Authorizing Provider  albuterol  (VENTOLIN  HFA) 108 (90 Base) MCG/ACT inhaler Inhale 1-2 puffs into the lungs every 4 (four) hours as needed for wheezing or shortness of breath. 05/06/24  Yes [provider]  cloNIDine  (CATAPRES ) 0.1 MG tablet Take 1 tablet (0.1 mg total) by mouth 2 (two) times daily as needed (blood pressure above 160). 05/05/24  Yes Pollina, Marine Sia, MD  diazepam  (VALIUM ) 5 MG tablet Take 2.5 mg by mouth daily as  needed for anxiety. 05/29/23  Yes [provider]  levothyroxine  (SYNTHROID ) 88 MCG tablet TAKE 1 TABLET DAILY ON MONDAY THROUGH SATURDAY AND ONE-HALF (1/2) TABLET ON SUNDAY Patient taking differently: Take 88 mcg by mouth See admin instructions. TAKE 1 TABLET DAILY ON MONDAY THROUGH SATURDAY AND ONE-HALF (1/2) TABLET ON SUNDAY 10/10/22  Yes Lajean Pike, MD  pantoprazole  (PROTONIX ) 40 MG tablet Take 1 tablet (40 mg total) by mouth 2 (two) times daily before a meal. 11/06/23  Yes April Knack, Kristen S, PA-C  pravastatin  (PRAVACHOL ) 40 MG tablet Take 40 mg by mouth daily. 03/13/24  Yes [provider]  sodium chloride  0.9 % nebulizer solution Take 3 mLs by nebulization daily as needed for wheezing. 04/22/24  Yes Corbin Dess, PA-C  atorvastatin  (LIPITOR) 40 MG tablet Take 1 tablet (40 mg total) by mouth at bedtime. 01/08/24   Mason Sole, Pratik D, DO  Carbinoxamine  Maleate 4 MG TABS  Take 1 tablet (4 mg total) by mouth at bedtime. 01/02/23 04/27/24  Rochester Chuck, MD  ferrous sulfate  325 (65 FE) MG tablet Take 325 mg by mouth. Takes one on Monday, Wednesday and Friday    [provider]  fluticasone  (FLONASE ) 50 MCG/ACT nasal spray Place 2 sprays into both nostrils daily. 12/02/23   Kandice Orleans, MD  ipratropium (ATROVENT ) 0.06 % nasal spray Place 1 spray into both nostrils 4 (four) times daily as needed for rhinitis. 12/02/23   Kandice Orleans, MD  potassium chloride  SA (KLOR-CON  M) 20 MEQ tablet Take 1 tablet (20 mEq total) by mouth 2 (two) times daily. 04/22/24   Alissa April, MD  senna (SENOKOT) 8.6 MG tablet Take 2 tablets by mouth at bedtime.    [provider]    Physical Exam: Vitals:   05/09/24 1250 05/09/24 1253 05/09/24 1254 05/09/24 1300  BP:  (!) 163/68  (!) 156/71  Pulse: (!) 103 (!) 106 (!) 106   Resp:  16 (!) 31   Temp:  98.6 F (37 C)    TempSrc:  Oral    SpO2: 96% 94% 94%    General exam: Lethargic; but able to be aroused and follow simple  commands. Respiratory system: No wheezing or crackles; good saturation on room air. Cardiovascular system:RRR. No rubs or gallops; no JVD. Gastrointestinal system: Abdomen is nondistended, soft and nontender. No organomegaly or masses felt. Normal bowel sounds heard. Central nervous system: Mild left facial droop, that is appreciated drift in patient's left upper and left lower extremity.  Preserved muscle tone appreciated in her right upper and lower extremity. Extremities: No able to move 4 limbs; left upper and lower extremity appears to be weaker on exam.  No cyanosis, no clubbing, no edema. Skin: No petechiae. Psychiatry: Judgement and insight appear normal. Mood & affect appropriate.   Data Reviewed: CBC: WBC 17.2, hemoglobin 10.1 and platelet count 1 38K Comprehensive metabolic panel: Sodium 133, potassium 3.8, chloride 102, bicarb 21, BUN 35, creatinine 2.61, normal LFTs and GFR 17. Prothrombin time/INR: 17.6 and 1.4 respectively.  Assessment and Plan: Stroke-like symptoms - No hemorrhagic stroke appreciated on CT scan - Patient will be admitted to telemetry bed and will check MRI, 2D echo and carotid Dopplers -TSH, B12, lipid panel and A1c - Speech therapy, PT/OT evaluation requested -Allowing for permissive hypertension and looking for dual antiplatelet therapy for 21 days with subsequent aspirin  on daily basis for secondary prevention past that time - Will follow any further recommendation by neurology service - Continue supportive care and follow clinical response.  Acute renal failure superimposed on stage 3b chronic kidney disease (HCC) - Will check urinalysis - Gentle fluid resuscitation will be provided - Minimize nephrotoxic agents - Will avoid hypotension and the use of contrast - Follow renal function trend.   COPD (chronic obstructive pulmonary disease) (HCC) - Stable and not demonstrating acute exacerbation - Continue as needed bronchodilator  management.  Acquired hypothyroidism - Will update TSH - Continue Synthroid   Gastroesophageal reflux disease - Continue PPI.  Essential hypertension - Will follow vital signs - Permissive hypertension for the next 48 hours while ruling out ischemic stroke recommended by neurology; planning to treat for systolic blood pressure more than 200 and/or diastolic blood pressure more than 110 with as needed hydralazine .  Dyslipidemia - Update lipid panel - Continue statin.  Other dysphagia - Patient with oropharyngeal dysphagia at baseline requiring dysphagia 1 diet with thin liquids - Will assess bedside swallowing  and place adequate orders if she passes test.  DNR (do not resuscitate) -CODE STATUS has been confirmed with patient's son at bedside and patient's daughter over the phone. - Continue to treat was treatable - No heroic measures or invasive intervention. -Will respect her wishes; DNR/DNI.    Advance Care Planning:   Code Status: Do not attempt resuscitation (DNR) PRE-ARREST INTERVENTIONS DESIRED   Consults: Neurology (telemedicine).  Family Communication: Son at bedside.  Severity of Illness: The appropriate patient status for this patient is OBSERVATION. Observation status is judged to be reasonable and necessary in order to provide the required intensity of service to ensure the patient's safety. The patient's presenting symptoms, physical exam findings, and initial radiographic and laboratory data in the context of their medical condition is felt to place them at decreased risk for further clinical deterioration. Furthermore, it is anticipated that the patient will be medically stable for discharge from the hospital within 2 midnights of admission.   Author: Justina Oman, MD 05/09/2024 3:35 PM  For on call review [email or web address].

## 2024-05-09 NOTE — ED Notes (Signed)
 ED TO INPATIENT HANDOFF REPORT  ED Nurse Name and Phone #: Kaizer Dissinger  S Name/Age/Gender Christine Leblanc 89 y.o. female Room/Bed: APA09/APA09  Code Status   Code Status: Do not attempt resuscitation (DNR) PRE-ARREST INTERVENTIONS DESIRED  Home/SNF/Other Home Patient oriented to: self, place, time, and situation Is this baseline? Yes   Triage Complete: Triage complete  Chief Complaint Stroke-like symptoms [R29.90]  Triage Note Pt arrived via [facility] from home c/o stroke like Sx, LKW 10:30pm last night, c/o L sided weakness and L sided leaning, cbg 77 EDP at bedside    Allergies Allergies  Allergen Reactions   Celecoxib Shortness Of Breath   Dexlansoprazole  Anaphylaxis and Other (See Comments)    abd pain   Famotidine Other (See Comments)    Makes her feel "very bad"    Level of Care/Admitting Diagnosis ED Disposition     ED Disposition  Admit   Condition  --   Comment  Hospital Area: [facility] [[id number]]  Level of Care: Telemetry [5]  Covid Evaluation: Asymptomatic - no recent exposure (last 10 days) testing not required  Diagnosis: Stroke-like symptoms [086578]  Admitting Physician: Justina Oman [[id number]]  Attending Physician: Jennet Mode          B Medical/Surgery History Past Medical History:  Diagnosis Date   Anemia    Asthma    Baker's cyst    Cancer (HCC)    skin Ca- ? basal cell    Chronic back pain    Chronic neck pain    Degenerative joint disease    Left shoulder; cervical spine, knees & hands    Gastroesophageal reflux disease    Hiatal hernia; distal esophageal web requiring dilatation; gastric polyps; gastritis; refuses colonoscopy   GERD (gastroesophageal reflux disease)    History of stress test 1990's   stress test done under the care of Dr. Macdonald Savoy & Dr. Katheryne Pane, now being followed by Dr. Amanda Jungling- in [place] , recently seen & told to f/U in one yr.    Hyperlipidemia 04/27/2011   Hypertension    Hypothyroidism     Lymphocytic thyroiditis    Mild carotid artery disease (HCC)    Multiple thyroid  nodules 2010   Adenomatous; thyroidectomy in 2010   Orthostasis    Osteoarthritis of right knee 07/27/2014   Pneumonia    hosp. for pneumonia- long time ago    Primary localized osteoarthrosis of right shoulder 06/21/2015   Seizures (HCC)    yes- as a child- & into adult years, states she took med. for them at one time, stopped at 32 yrs. of age    Syncope and collapse 2007   Possible CVA in 2012 with left lower extremity weakness; refused hospitalization; CT-Atrophy and chronic microvascular ischemic change.    Past Surgical History:  Procedure Laterality Date   ABDOMINAL HYSTERECTOMY     fibroids   CATARACT EXTRACTION     Bilateral; redo surgery on the right for incomplete primary procedure   COLONOSCOPY  Remote   ESOPHAGOGASTRODUODENOSCOPY  11/2009   Dr. Nolene Baumgarten: probable distal web s/p dilation small hiatal hernia/gastric polyps/mild gastritis, appeared to have narrowing at the junction of D1 and D2 dilated up to 12 mm   ESOPHAGOGASTRODUODENOSCOPY N/A 03/21/2015   Surgeon: Alyce Jubilee, MD; proximal esophageal web s/p dilation, multiple gastric polyps s/p multiple biopsies, mild erosive gastritis s/p biopsy.  Pathology with fundic gland polyps, chronic gastritis, negative for H. pylori.   EYE SURGERY     PROLAPSED UTERINE FIBROID  LIGATION     outcomed with rectocele & cystocele   SAVORY DILATION N/A 03/21/2015   Procedure: SAVORY DILATION;  Surgeon: Alyce Jubilee, MD;  Location: [facility];  Service: Endoscopy;  Laterality: N/A;   SHOULDER SURGERY     left   TOTAL KNEE ARTHROPLASTY Right 07/27/2014   Procedure: RIGHT TOTAL KNEE ARTHROPLASTY;  Surgeon: Neville Barbone, MD;  Location: [facility] OR;  Service: Orthopedics;  Laterality: Right;   TOTAL SHOULDER ARTHROPLASTY Right 06/21/2015   Procedure: RIGHT TOTAL SHOULDER ARTHROPLASTY;  Surgeon: Osa Blase, MD;  Location: [facility] OR;  Service: Orthopedics;   Laterality: Right;   TOTAL THYROIDECTOMY  2010     A IV Location/Drains/Wounds Patient Lines/Drains/Airways Status     Active Line/Drains/Airways     Name Placement date Placement time Site Days   Peripheral IV 05/09/24 22 G 1" Posterior;Right Hand 05/09/24  1236  Hand  less than 1            Intake/Output Last 24 hours No intake or output data in the 24 hours ending 05/09/24 1819  Labs/Imaging Results for orders placed or performed during the hospital encounter of 05/09/24 (from the past 48 hours)  Urine rapid drug screen (hosp performed)     Status: Abnormal   Collection Time: 05/09/24 12:26 PM  Result Value Ref Range   Opiates NONE DETECTED NONE DETECTED   Cocaine NONE DETECTED NONE DETECTED   Benzodiazepines POSITIVE (A) NONE DETECTED   Amphetamines NONE DETECTED NONE DETECTED   Tetrahydrocannabinol NONE DETECTED NONE DETECTED   Barbiturates NONE DETECTED NONE DETECTED    Comment: (NOTE) DRUG SCREEN FOR MEDICAL PURPOSES ONLY.  IF CONFIRMATION IS NEEDED FOR ANY PURPOSE, NOTIFY LAB WITHIN 5 DAYS.  LOWEST DETECTABLE LIMITS FOR URINE DRUG SCREEN Drug Class                     Cutoff (ng/mL) Amphetamine and metabolites    1000 Barbiturate and metabolites    200 Benzodiazepine                 200 Opiates and metabolites        300 Cocaine and metabolites        300 THC                            50 Performed at [facility], [address] [place], [place] [address]   CBG monitoring, ED     Status: None   Collection Time: 05/09/24 12:27 PM  Result Value Ref Range   Glucose-Capillary 77 70 - 99 mg/dL    Comment: Glucose reference range applies only to samples taken after fasting for at least 8 hours.  Ethanol     Status: None   Collection Time: 05/09/24  1:01 PM  Result Value Ref Range   Alcohol, Ethyl (B) <15 <15 mg/dL    Comment: Please note change in reference range. (NOTE) For medical purposes only. Performed at [facility], [address]  [place], [place] [address]   Protime-INR     Status: Abnormal   Collection Time: 05/09/24  1:01 PM  Result Value Ref Range   Prothrombin Time 17.6 (H) 11.4 - 15.2 seconds   INR 1.4 (H) 0.8 - 1.2    Comment: (NOTE) INR goal varies based on device and disease states. Performed at [facility], [address] [place], [place] [address]   APTT  Status: Abnormal   Collection Time: 05/09/24  1:01 PM  Result Value Ref Range   aPTT 37 (H) 24 - 36 seconds    Comment:        IF BASELINE aPTT IS ELEVATED, SUGGEST PATIENT RISK ASSESSMENT BE USED TO DETERMINE APPROPRIATE ANTICOAGULANT THERAPY. Performed at [facility], [address] [place], [place] [address]   CBC     Status: Abnormal   Collection Time: 05/09/24  1:01 PM  Result Value Ref Range   WBC 17.2 (H) 4.0 - 10.5 K/uL   RBC 3.43 (L) 3.87 - 5.11 MIL/uL   Hemoglobin 10.1 (L) 12.0 - 15.0 g/dL   HCT 91.4 (L) 78.2 - 95.6 %   MCV 92.1 80.0 - 100.0 fL   MCH 29.4 26.0 - 34.0 pg   MCHC 32.0 30.0 - 36.0 g/dL   RDW 21.3 (H) 08.6 - 57.8 %   Platelets 138 (L) 150 - 400 K/uL   nRBC 0.0 0.0 - 0.2 %    Comment: Performed at [facility], [address] [place], [place] [address]  Differential     Status: Abnormal   Collection Time: 05/09/24  1:01 PM  Result Value Ref Range   Neutrophils Relative % 93 %   Neutro Abs 15.5 (H) 1.7 - 7.7 K/uL   Lymphocytes Relative 2 %   Lymphs Abs 0.3 (L) 0.7 - 4.0 K/uL   Monocytes Relative 4 %   Monocytes Absolute 0.7 0.1 - 1.0 K/uL   Eosinophils Relative 0 %   Eosinophils Absolute 0.0 0.0 - 0.5 K/uL   Basophils Relative 0 %   Basophils Absolute 0.1 0.0 - 0.1 K/uL   Immature Granulocytes 1 %   Abs Immature Granulocytes 0.15 (H) 0.00 - 0.07 K/uL    Comment: Performed at [facility], [address] [place], [place] [address]  Comprehensive metabolic panel     Status: Abnormal   Collection Time: 05/09/24  1:01 PM  Result Value Ref Range   Sodium 133 (L) 135 - 145 mmol/L   Potassium 3.8 3.5 - 5.1 mmol/L    Chloride 102 98 - 111 mmol/L   CO2 21 (L) 22 - 32 mmol/L   Glucose, Bld 93 70 - 99 mg/dL    Comment: Glucose reference range applies only to samples taken after fasting for at least 8 hours.   BUN 35 (H) 8 - 23 mg/dL   Creatinine, Ser 1.32 (H) 0.44 - 1.00 mg/dL   Calcium  8.5 (L) 8.9 - 10.3 mg/dL   Total Protein 6.6 6.5 - 8.1 g/dL   Albumin 2.9 (L) 3.5 - 5.0 g/dL   AST 36 15 - 41 U/L   ALT 16 0 - 44 U/L   Alkaline Phosphatase 94 38 - 126 U/L   Total Bilirubin 1.1 0.0 - 1.2 mg/dL   GFR, Estimated 17 (L) >60 mL/min    Comment: (NOTE) Calculated using the CKD-EPI Creatinine Equation (2021)    Anion gap 10 5 - 15    Comment: Performed at [facility], [address] [place], [place] [address]  Urinalysis, Routine w reflex microscopic -Urine, Clean Catch     Status: Abnormal   Collection Time: 05/09/24  1:59 PM  Result Value Ref Range   Color, Urine AMBER (A) YELLOW    Comment: BIOCHEMICALS MAY BE AFFECTED BY COLOR   APPearance CLOUDY (A) CLEAR   Specific Gravity, Urine 1.017 1.005 - 1.030   pH 5.0 5.0 - 8.0   Glucose, UA NEGATIVE NEGATIVE mg/dL  Hgb urine dipstick MODERATE (A) NEGATIVE   Bilirubin Urine NEGATIVE NEGATIVE   Ketones, ur NEGATIVE NEGATIVE mg/dL   Protein, ur 782 (A) NEGATIVE mg/dL   Nitrite NEGATIVE NEGATIVE   Leukocytes,Ua LARGE (A) NEGATIVE   RBC / HPF >50 0 - 5 RBC/hpf   WBC, UA >50 0 - 5 WBC/hpf   Bacteria, UA MANY (A) NONE SEEN   Squamous Epithelial / HPF 0-5 0 - 5 /HPF   WBC Clumps PRESENT    Mucus PRESENT     Comment: Performed at [facility], [address], [place], [place] [address]  TSH     Status: None   Collection Time: 05/09/24  2:32 PM  Result Value Ref Range   TSH 1.466 0.350 - 4.500 uIU/mL    Comment: Performed by a 3rd Generation assay with a functional sensitivity of <=0.01 uIU/mL. Performed at [facility], [address], [place], [place] [address]   Vitamin B12     Status: None   Collection Time: 05/09/24  2:32 PM  Result Value Ref  Range   Vitamin B-12 450 180 - 914 pg/mL    Comment: (NOTE) This assay is not validated for testing neonatal or myeloproliferative syndrome specimens for Vitamin B12 levels. Performed at [facility], [address], [place], [place] [address]    MR BRAIN WO CONTRAST Result Date: 05/09/2024 CLINICAL DATA:  Initial evaluation for acute neuro deficit, stroke suspected. EXAM: MRI HEAD WITHOUT CONTRAST TECHNIQUE: Multiplanar, multiecho pulse sequences of the brain and surrounding structures were obtained without intravenous contrast. COMPARISON:  CT from earlier the same day. FINDINGS: Brain: Examination is limited as the patient was unable to tolerate the full length of the exam. Axial and coronal DWI images only were performed. Diffusion-weighted imaging demonstrates no abnormal foci of restricted diffusion to suggest acute or subacute ischemia. No visible areas of chronic cortical infarction. No visible mass lesion, midline shift, or mass effect. No hydrocephalus. No visible extra-axial fluid collection. Underlying age-related cerebral atrophy noted. Vascular: Not assessed on this limited exam. Skull and upper cervical spine: Not assessed on this limited exam. Sinuses/Orbits: Not assessed on this limited exam. Other: None. IMPRESSION: 1. Limited exam with only axial and coronal DWI sequences performed. 2. No evidence for acute or subacute ischemia. Electronically Signed   By: Virgia Griffins M.D.   On: 05/09/2024 18:06   DG Chest Port 1 View Result Date: 05/09/2024 CLINICAL DATA:  Shortness of breath. EXAM: PORTABLE CHEST 1 VIEW COMPARISON:  04/22/2024 FINDINGS: New patchy opacity at the left lung base, possible small left pleural effusion. Stable heart size and mediastinal contours. Aortic atherosclerosis. No pulmonary edema or pneumothorax. Bilateral humeral arthroplasties. IMPRESSION: New patchy opacity at the left lung base, possible small left pleural effusion. Findings are suspicious for  pneumonia. Electronically Signed   By: Chadwick Colonel M.D.   On: 05/09/2024 16:34   CT HEAD CODE STROKE WO CONTRAST Result Date: 05/09/2024 CLINICAL DATA:  Code stroke.  Left-sided weakness EXAM: CT HEAD WITHOUT CONTRAST TECHNIQUE: Contiguous axial images were obtained from the base of the skull through the vertex without intravenous contrast. RADIATION DOSE REDUCTION: This exam was performed according to the departmental dose-optimization program which includes automated exposure control, adjustment of the mA and/or kV according to patient size and/or use of iterative reconstruction technique. COMPARISON:  Five days prior FINDINGS: Brain: No evidence of acute infarction, hemorrhage, hydrocephalus, extra-axial collection or mass lesion/mass effect. Low-density in the cerebral white matter attributed to chronic small vessel ischemia.  Chronic lacunar infarct or dilated perivascular space at the external capsule/posterior putamen on the right. Vascular: No hyperdense vessel or unexpected calcification. Skull: Normal. Negative for fracture or focal lesion. Sinuses/Orbits: No acute finding. Other: Intermittent motion artifact. ASPECTS [facility] Stroke Program Early CT Score) - Ganglionic level infarction (caudate, lentiform nuclei, internal capsule, insula, M1-M3 cortex): 7 - Supraganglionic infarction (M4-M6 cortex): 3 Total score (0-10 with 10 being normal): 10 IMPRESSION: 1. Aging brain without acute or interval finding. 2. Intermittent motion artifact. Electronically Signed   By: Ronnette Coke M.D.   On: 05/09/2024 12:43    Pending Labs Unresulted Labs (From admission, onward)     Start     Ordered   05/10/24 0500  Lipid panel  (Labs)  Tomorrow morning,   R       Comments: Fasting    05/09/24 1431   05/10/24 0500  Hemoglobin A1c  (Labs)  Tomorrow morning,   R       Comments: To assess prior glycemic control    05/09/24 1431            Vitals/Pain Today's Vitals   05/09/24 1250 05/09/24  1253 05/09/24 1254 05/09/24 1300  BP:  (!) 163/68  (!) 156/71  Pulse: (!) 103 (!) 106 (!) 106   Resp:  16 (!) 31   Temp:  98.6 F (37 C)    TempSrc:  Oral    SpO2: 96% 94% 94%     Isolation Precautions No active isolations  Medications Medications   stroke: early stages of recovery book (has no administration in time range)  0.9 %  sodium chloride  infusion (0 mLs Intravenous Hold 05/09/24 1521)  acetaminophen  (TYLENOL ) tablet 650 mg (has no administration in time range)    Or  acetaminophen  (TYLENOL ) 160 MG/5ML solution 650 mg (has no administration in time range)    Or  acetaminophen  (TYLENOL ) suppository 650 mg (has no administration in time range)  heparin  injection 5,000 Units (has no administration in time range)  ondansetron  (ZOFRAN ) injection 4 mg (has no administration in time range)  ipratropium-albuterol  (DUONEB) 0.5-2.5 (3) MG/3ML nebulizer solution 3 mL (has no administration in time range)  atorvastatin  (LIPITOR) tablet 40 mg (has no administration in time range)  fluticasone  (FLONASE ) 50 MCG/ACT nasal spray 2 spray (has no administration in time range)  levothyroxine  (SYNTHROID ) tablet 88 mcg (has no administration in time range)  pantoprazole  (PROTONIX ) EC tablet 40 mg (40 mg Oral Patient Refused/Not Given 05/09/24 1718)  hydrALAZINE  (APRESOLINE ) injection 10 mg (has no administration in time range)  levothyroxine  (SYNTHROID ) tablet 44 mcg (has no administration in time range)  sodium chloride  0.9 % bolus 1,000 mL (0 mLs Intravenous Stopped 05/09/24 1555)    Mobility With assistance     Focused Assessments Neuro Assessment Handoff:  Swallow screen pass? No    NIH Stroke Scale  Dizziness Present: No Headache Present: No Interval: Initial Level of Consciousness (1a.)   : Not alert, but arousable by minor stimulation to obey, answer, or respond LOC Questions (1b. )   : Answers both questions correctly LOC Commands (1c. )   : Performs both tasks  correctly Best Gaze (2. )  : Normal Visual (3. )  : No visual loss Facial Palsy (4. )    : Minor paralysis Motor Arm, Left (5a. )   : Drift Motor Arm, Right (5b. ) : No drift Motor Leg, Left (6a. )  : No drift Motor Leg, Right (6b. ) : No drift  Limb Ataxia (7. ): Absent Sensory (8. )  : Mild-to-moderate sensory loss, patient feels pinprick is less sharp or is dull on the affected side, or there is a loss of superficial pain with pinprick, but patient is aware of being touched Best Language (9. )  : No aphasia Dysarthria (10. ): Normal Extinction/Inattention (11.)   : No Abnormality Complete NIHSS TOTAL: 4 Last date known well: 05/08/24 Last time known well: 2030 Neuro Assessment: Exceptions to WDL Neuro Checks:   Initial (05/09/24 1249)  Has TPA been given? No If patient is a Neuro Trauma and patient is going to OR before floor call report to 4N Charge nurse: [phone] or [phone]   R Recommendations: See Admitting Provider Note  Report given to:   Additional Notes:

## 2024-05-09 NOTE — Assessment & Plan Note (Signed)
 Continue PPI ?

## 2024-05-09 NOTE — Assessment & Plan Note (Signed)
-   Will follow vital signs - Permissive hypertension for the next 48 hours while ruling out ischemic stroke recommended by neurology; planning to treat for systolic blood pressure more than 200 and/or diastolic blood pressure more than 110 with as needed hydralazine .

## 2024-05-09 NOTE — Progress Notes (Signed)
 Patient RED mews at this time; clarifying vitals prior to approval to unit.

## 2024-05-09 NOTE — ED Notes (Signed)
 Pt reports chest pain. Notified primary RN and EDP and captured a new EKG.

## 2024-05-09 NOTE — Assessment & Plan Note (Signed)
-   Will check urinalysis - Gentle fluid resuscitation will be provided - Minimize nephrotoxic agents - Will avoid hypotension and the use of contrast - Follow renal function trend.

## 2024-05-09 NOTE — Progress Notes (Signed)
 Telestroke cart was activated at 1219 for an EMS pre arrival alert. EMS arrived at 1221. Per treatment team, pt's LKW was at 2230 last night with c/o L sided weakness and leaning to the left. Dr. Bryna Car, EDP at bedside assessing patient at time of EMS arrival. Pt transported to CT at 1224 and returned to room at 1236. [facility] was paged for code stroke at 1220. [facility] called Dr. Doretta Gant at 1226. Dr. Doretta Gant appeared on the cart at 1226 to assess the patient. Based on [facility]'s assessment, pt does not meet criteria for emergent interventions at this time 2/2 onset known greater than 4.5 hours. [facility] to f/u with EDP regarding recommendations. No further needs from Telestroke RN at this time. Telestroke cart disconnected at 1246.    mRS-2

## 2024-05-09 NOTE — ED Provider Notes (Signed)
 [place] EMERGENCY DEPARTMENT AT [facility] Provider Note   CSN: [id number] Arrival date & time: 05/09/24  1222  An emergency department physician performed an initial assessment on this suspected stroke patient at 12.  History  Chief Complaint  Patient presents with   Code Stroke    Christine Leblanc is a 89 y.o. female.  Patient is a history of hypertension and hyperlipidemia.  She lives alone at home and takes care of herself.  Her son checks on her periodically.  Patient was last seen normal at 1030 last night.  Her son found her this morning and she was lethargic  The history is provided by a relative. No language interpreter was used.  Weakness Severity:  Moderate Onset quality:  Sudden Timing:  Constant Progression:  Waxing and waning Chronicity:  New Context: not alcohol use   Relieved by:  Nothing Worsened by:  Nothing Ineffective treatments:  None tried      Home Medications Prior to Admission medications   Medication Sig Start Date End Date Taking? Authorizing Provider  atorvastatin  (LIPITOR) 40 MG tablet Take 1 tablet (40 mg total) by mouth at bedtime. 01/08/24   Mason Sole, Pratik D, DO  Carbinoxamine  Maleate 4 MG TABS Take 1 tablet (4 mg total) by mouth at bedtime. 01/02/23 04/27/24  Rochester Chuck, MD  cloNIDine  (CATAPRES ) 0.1 MG tablet Take 1 tablet (0.1 mg total) by mouth 2 (two) times daily as needed (blood pressure above 160). 05/05/24   Ballard Bongo, MD  diazepam  (VALIUM ) 5 MG tablet Take 2.5 mg by mouth daily as needed for anxiety. 05/29/23   [provider]  ferrous sulfate  325 (65 FE) MG tablet Take 325 mg by mouth. Takes one on Monday, Wednesday and Friday    [provider]  fluticasone  (FLONASE ) 50 MCG/ACT nasal spray Place 2 sprays into both nostrils daily. 12/02/23   Kandice Orleans, MD  ipratropium (ATROVENT ) 0.06 % nasal spray Place 1 spray into both nostrils 4 (four) times daily as needed for rhinitis. 12/02/23    Kandice Orleans, MD  levothyroxine  (SYNTHROID ) 88 MCG tablet TAKE 1 TABLET DAILY ON MONDAY THROUGH SATURDAY AND ONE-HALF (1/2) TABLET ON SUNDAY Patient taking differently: Take 88 mcg by mouth See admin instructions. TAKE 1 TABLET DAILY ON MONDAY THROUGH SATURDAY AND ONE-HALF (1/2) TABLET ON SUNDAY 10/10/22   Lajean Pike, MD  pantoprazole  (PROTONIX ) 40 MG tablet Take 1 tablet (40 mg total) by mouth 2 (two) times daily before a meal. 11/06/23   Evander Hills, PA-C  potassium chloride  SA (KLOR-CON  M) 20 MEQ tablet Take 1 tablet (20 mEq total) by mouth 2 (two) times daily. 04/22/24   Alissa April, MD  senna (SENOKOT) 8.6 MG tablet Take 2 tablets by mouth at bedtime.    [provider]  sodium chloride  0.9 % nebulizer solution Take 3 mLs by nebulization daily as needed for wheezing. 04/22/24   Corbin Dess, PA-C      Allergies    Celecoxib, Dexlansoprazole , and Famotidine    Review of Systems   Review of Systems  Unable to perform ROS: Mental status change    Physical Exam Updated Vital Signs BP (!) 156/71   Pulse (!) 106   Temp 98.6 F (37 C) (Oral)   Resp (!) 31   SpO2 94%  Physical Exam Vitals and nursing note reviewed.  Constitutional:      Appearance: She is well-developed.     Comments: Lethargic  HENT:  Head: Normocephalic.     Nose: Nose normal.  Eyes:     General: No scleral icterus.    Conjunctiva/sclera: Conjunctivae normal.  Neck:     Thyroid : No thyromegaly.  Cardiovascular:     Rate and Rhythm: Normal rate and regular rhythm.     Heart sounds: No murmur heard.    No friction rub. No gallop.  Pulmonary:     Breath sounds: No stridor. No wheezing or rales.  Chest:     Chest wall: No tenderness.  Abdominal:     General: There is no distension.     Tenderness: There is no abdominal tenderness. There is no rebound.  Musculoskeletal:     Cervical back: Neck supple.     Comments: Left-sided facial weakness and weakness in left arm and leg   Lymphadenopathy:     Cervical: No cervical adenopathy.  Skin:    Findings: No erythema or rash.  Neurological:     Mental Status: She is oriented to person, place, and time.     Motor: No abnormal muscle tone.     Coordination: Coordination normal.  Psychiatric:        Behavior: Behavior normal.     ED Results / Procedures / Treatments   Labs (all labs ordered are listed, but only abnormal results are displayed) Labs Reviewed  PROTIME-INR - Abnormal; Notable for the following components:      Result Value   Prothrombin Time 17.6 (*)    INR 1.4 (*)    All other components within normal limits  APTT - Abnormal; Notable for the following components:   aPTT 37 (*)    All other components within normal limits  CBC - Abnormal; Notable for the following components:   WBC 17.2 (*)    RBC 3.43 (*)    Hemoglobin 10.1 (*)    HCT 31.6 (*)    RDW 15.9 (*)    Platelets 138 (*)    All other components within normal limits  DIFFERENTIAL - Abnormal; Notable for the following components:   Neutro Abs 15.5 (*)    Lymphs Abs 0.3 (*)    Abs Immature Granulocytes 0.15 (*)    All other components within normal limits  COMPREHENSIVE METABOLIC PANEL WITH GFR - Abnormal; Notable for the following components:   Sodium 133 (*)    CO2 21 (*)    BUN 35 (*)    Creatinine, Ser 2.61 (*)    Calcium  8.5 (*)    Albumin 2.9 (*)    GFR, Estimated 17 (*)    All other components within normal limits  ETHANOL  RAPID URINE DRUG SCREEN, HOSP PERFORMED  URINALYSIS, ROUTINE W REFLEX MICROSCOPIC  CBG MONITORING, ED  I-STAT CHEM 8, ED    EKG None  Radiology CT HEAD CODE STROKE WO CONTRAST Result Date: 05/09/2024 CLINICAL DATA:  Code stroke.  Left-sided weakness EXAM: CT HEAD WITHOUT CONTRAST TECHNIQUE: Contiguous axial images were obtained from the base of the skull through the vertex without intravenous contrast. RADIATION DOSE REDUCTION: This exam was performed according to the departmental  dose-optimization program which includes automated exposure control, adjustment of the mA and/or kV according to patient size and/or use of iterative reconstruction technique. COMPARISON:  Five days prior FINDINGS: Brain: No evidence of acute infarction, hemorrhage, hydrocephalus, extra-axial collection or mass lesion/mass effect. Low-density in the cerebral white matter attributed to chronic small vessel ischemia. Chronic lacunar infarct or dilated perivascular space at the external capsule/posterior putamen on the right.  Vascular: No hyperdense vessel or unexpected calcification. Skull: Normal. Negative for fracture or focal lesion. Sinuses/Orbits: No acute finding. Other: Intermittent motion artifact. ASPECTS [facility] Stroke Program Early CT Score) - Ganglionic level infarction (caudate, lentiform nuclei, internal capsule, insula, M1-M3 cortex): 7 - Supraganglionic infarction (M4-M6 cortex): 3 Total score (0-10 with 10 being normal): 10 IMPRESSION: 1. Aging brain without acute or interval finding. 2. Intermittent motion artifact. Electronically Signed   By: Ronnette Coke M.D.   On: 05/09/2024 12:43    Procedures Procedures    Medications Ordered in ED Medications  sodium chloride  0.9 % bolus 1,000 mL (has no administration in time range)    ED Course/ Medical Decision Making/ A&P  CRITICAL CARE Performed by: Cheyenne Cotta Total critical care time: 45 minutes Critical care time was exclusive of separately billable procedures and treating other patients. Critical care was necessary to treat or prevent imminent or life-threatening deterioration. Critical care was time spent personally by me on the following activities: development of treatment plan with patient and/or surrogate as well as nursing, discussions with consultants, evaluation of patient's response to treatment, examination of patient, obtaining history from patient or surrogate, ordering and performing treatments and interventions,  ordering and review of laboratory studies, ordering and review of radiographic studies, pulse oximetry and re-evaluation of patient's condition.                                Medical Decision Making Amount and/or Complexity of Data Reviewed Labs: ordered. Radiology: ordered.  Risk Decision regarding hospitalization.   With stroke symptoms and AKI.  She will be admitted to medicine for stroke workup        Final Clinical Impression(s) / ED Diagnoses Final diagnoses:  Acute ischemic stroke [facility])    Rx / DC Orders ED Discharge Orders     None         Cheyenne Cotta, MD 05/09/24 1658

## 2024-05-09 NOTE — Care Management Obs Status (Signed)
 MEDICARE OBSERVATION STATUS NOTIFICATION   Patient Details  Name: Christine Leblanc MRN: [id number] Date of Birth: [birth year]   Medicare Observation Status Notification Given:   Yes.    Lynda Sands, RN 05/09/2024, 4:14 PM

## 2024-05-09 NOTE — Assessment & Plan Note (Signed)
-  CODE STATUS has been confirmed with patient's son at bedside and patient's daughter over the phone. - Continue to treat was treatable - No heroic measures or invasive intervention. -Will respect her wishes; DNR/DNI.

## 2024-05-09 NOTE — Plan of Care (Signed)
   Problem: Education: Goal: Knowledge of disease or condition will improve Outcome: Progressing Goal: Knowledge of secondary prevention will improve (MUST DOCUMENT ALL) Outcome: Progressing

## 2024-05-10 ENCOUNTER — Other Ambulatory Visit (HOSPITAL_COMMUNITY): Payer: Self-pay | Admitting: *Deleted

## 2024-05-10 ENCOUNTER — Encounter (HOSPITAL_COMMUNITY): Payer: Self-pay | Admitting: Internal Medicine

## 2024-05-10 ENCOUNTER — Observation Stay (HOSPITAL_BASED_OUTPATIENT_CLINIC_OR_DEPARTMENT_OTHER)

## 2024-05-10 ENCOUNTER — Observation Stay (HOSPITAL_COMMUNITY)

## 2024-05-10 DIAGNOSIS — Z79890 Hormone replacement therapy (postmenopausal): Secondary | ICD-10-CM | POA: Diagnosis not present

## 2024-05-10 DIAGNOSIS — R29818 Other symptoms and signs involving the nervous system: Secondary | ICD-10-CM | POA: Diagnosis present

## 2024-05-10 DIAGNOSIS — Z803 Family history of malignant neoplasm of breast: Secondary | ICD-10-CM | POA: Diagnosis not present

## 2024-05-10 DIAGNOSIS — E785 Hyperlipidemia, unspecified: Secondary | ICD-10-CM | POA: Diagnosis present

## 2024-05-10 DIAGNOSIS — J439 Emphysema, unspecified: Secondary | ICD-10-CM | POA: Diagnosis not present

## 2024-05-10 DIAGNOSIS — R2990 Unspecified symptoms and signs involving the nervous system: Secondary | ICD-10-CM | POA: Diagnosis not present

## 2024-05-10 DIAGNOSIS — E890 Postprocedural hypothyroidism: Secondary | ICD-10-CM | POA: Diagnosis present

## 2024-05-10 DIAGNOSIS — K219 Gastro-esophageal reflux disease without esophagitis: Secondary | ICD-10-CM | POA: Diagnosis present

## 2024-05-10 DIAGNOSIS — Z79899 Other long term (current) drug therapy: Secondary | ICD-10-CM | POA: Diagnosis not present

## 2024-05-10 DIAGNOSIS — N179 Acute kidney failure, unspecified: Secondary | ICD-10-CM | POA: Diagnosis present

## 2024-05-10 DIAGNOSIS — I129 Hypertensive chronic kidney disease with stage 1 through stage 4 chronic kidney disease, or unspecified chronic kidney disease: Secondary | ICD-10-CM | POA: Diagnosis present

## 2024-05-10 DIAGNOSIS — N1832 Chronic kidney disease, stage 3b: Secondary | ICD-10-CM | POA: Diagnosis present

## 2024-05-10 DIAGNOSIS — I10 Essential (primary) hypertension: Secondary | ICD-10-CM

## 2024-05-10 DIAGNOSIS — Z8249 Family history of ischemic heart disease and other diseases of the circulatory system: Secondary | ICD-10-CM | POA: Diagnosis not present

## 2024-05-10 DIAGNOSIS — D72829 Elevated white blood cell count, unspecified: Secondary | ICD-10-CM | POA: Diagnosis not present

## 2024-05-10 DIAGNOSIS — Z833 Family history of diabetes mellitus: Secondary | ICD-10-CM | POA: Diagnosis not present

## 2024-05-10 DIAGNOSIS — Z87891 Personal history of nicotine dependence: Secondary | ICD-10-CM | POA: Diagnosis not present

## 2024-05-10 DIAGNOSIS — I639 Cerebral infarction, unspecified: Secondary | ICD-10-CM | POA: Diagnosis present

## 2024-05-10 DIAGNOSIS — J4489 Other specified chronic obstructive pulmonary disease: Secondary | ICD-10-CM | POA: Diagnosis present

## 2024-05-10 DIAGNOSIS — E860 Dehydration: Secondary | ICD-10-CM | POA: Diagnosis present

## 2024-05-10 DIAGNOSIS — R1319 Other dysphagia: Secondary | ICD-10-CM | POA: Diagnosis not present

## 2024-05-10 DIAGNOSIS — R1312 Dysphagia, oropharyngeal phase: Secondary | ICD-10-CM | POA: Diagnosis present

## 2024-05-10 DIAGNOSIS — Z8673 Personal history of transient ischemic attack (TIA), and cerebral infarction without residual deficits: Secondary | ICD-10-CM | POA: Diagnosis not present

## 2024-05-10 DIAGNOSIS — M7989 Other specified soft tissue disorders: Secondary | ICD-10-CM | POA: Diagnosis not present

## 2024-05-10 DIAGNOSIS — M79604 Pain in right leg: Secondary | ICD-10-CM | POA: Diagnosis not present

## 2024-05-10 DIAGNOSIS — N390 Urinary tract infection, site not specified: Secondary | ICD-10-CM | POA: Diagnosis present

## 2024-05-10 DIAGNOSIS — E039 Hypothyroidism, unspecified: Secondary | ICD-10-CM | POA: Diagnosis not present

## 2024-05-10 DIAGNOSIS — I6523 Occlusion and stenosis of bilateral carotid arteries: Secondary | ICD-10-CM | POA: Diagnosis not present

## 2024-05-10 DIAGNOSIS — Z96651 Presence of right artificial knee joint: Secondary | ICD-10-CM | POA: Diagnosis present

## 2024-05-10 DIAGNOSIS — Z90710 Acquired absence of both cervix and uterus: Secondary | ICD-10-CM | POA: Diagnosis not present

## 2024-05-10 DIAGNOSIS — R55 Syncope and collapse: Secondary | ICD-10-CM | POA: Diagnosis not present

## 2024-05-10 DIAGNOSIS — Z96611 Presence of right artificial shoulder joint: Secondary | ICD-10-CM | POA: Diagnosis present

## 2024-05-10 DIAGNOSIS — Z66 Do not resuscitate: Secondary | ICD-10-CM | POA: Diagnosis present

## 2024-05-10 DIAGNOSIS — R29810 Facial weakness: Secondary | ICD-10-CM | POA: Diagnosis present

## 2024-05-10 LAB — LIPID PANEL
Cholesterol: 88 mg/dL (ref 0–200)
HDL: 20 mg/dL — ABNORMAL LOW (ref >40)
LDL Cholesterol: 50 mg/dL (ref 0–99)
Total CHOL/HDL Ratio: 4.4 ratio
Triglycerides: 88 mg/dL (ref <150)
VLDL: 18 mg/dL (ref 0–40)

## 2024-05-10 LAB — CBC
HCT: 28.2 % — ABNORMAL LOW (ref 36.0–46.0)
Hemoglobin: 9.3 g/dL — ABNORMAL LOW (ref 12.0–15.0)
MCH: 29.6 pg (ref 26.0–34.0)
MCHC: 33 g/dL (ref 30.0–36.0)
MCV: 89.8 fL (ref 80.0–100.0)
Platelets: 122 10*3/uL — ABNORMAL LOW (ref 150–400)
RBC: 3.14 MIL/uL — ABNORMAL LOW (ref 3.87–5.11)
RDW: 16.4 % — ABNORMAL HIGH (ref 11.5–15.5)
WBC: 16 10*3/uL — ABNORMAL HIGH (ref 4.0–10.5)
nRBC: 0 % (ref 0.0–0.2)

## 2024-05-10 LAB — ECHOCARDIOGRAM COMPLETE
Area-P 1/2: 4.15 cm2
Height: 61 in
MV M vel: 6.07 m/s
MV Peak grad: 147.1 mmHg
S' Lateral: 2 cm
Weight: 1869.5 [oz_av]

## 2024-05-10 MED ORDER — CLOPIDOGREL BISULFATE 75 MG PO TABS
75.0000 mg | ORAL_TABLET | Freq: Every day | ORAL | Status: DC
  Administered 2024-05-10 – 2024-05-11 (×2): 75 mg via ORAL
  Filled 2024-05-10 (×2): qty 1

## 2024-05-10 MED ORDER — ASPIRIN 81 MG PO TBEC
81.0000 mg | DELAYED_RELEASE_TABLET | Freq: Every day | ORAL | Status: DC
  Administered 2024-05-10 – 2024-05-11 (×2): 81 mg via ORAL
  Filled 2024-05-10 (×2): qty 1

## 2024-05-10 MED ORDER — HALOPERIDOL 0.5 MG PO TABS
1.0000 mg | ORAL_TABLET | Freq: Four times a day (QID) | ORAL | Status: DC | PRN
Start: 2024-05-10 — End: 2024-05-11

## 2024-05-10 MED ORDER — HALOPERIDOL LACTATE 5 MG/ML IJ SOLN
1.0000 mg | Freq: Four times a day (QID) | INTRAMUSCULAR | Status: DC | PRN
  Filled 2024-05-10: qty 1

## 2024-05-10 MED ORDER — LEVOTHYROXINE SODIUM 88 MCG PO TABS
44.0000 ug | ORAL_TABLET | ORAL | Status: DC
  Administered 2024-05-10: 44 ug via ORAL
  Filled 2024-05-10: qty 1

## 2024-05-10 NOTE — Progress Notes (Signed)
 Patient presenting with confusion, IV taken out by patient, cathter intact and site clean, dry, and intact. 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 Patient and daughter educated on needing new IV for meds administration. Dr. Sunnie England made aware, no new orders obtained at this time.

## 2024-05-10 NOTE — Plan of Care (Signed)
  Problem: Education: Goal: Knowledge of secondary prevention will improve (MUST DOCUMENT ALL) Outcome: Progressing Goal: Knowledge of patient specific risk factors will improve (DELETE if not current risk factor) Outcome: Progressing   Problem: Ischemic Stroke/TIA Tissue Perfusion: Goal: Complications of ischemic stroke/TIA will be minimized Outcome: Progressing   Problem: Coping: Goal: Will verbalize positive feelings about self Outcome: Progressing Goal: Will identify appropriate support needs Outcome: Progressing   Problem: Health Behavior/Discharge Planning: Goal: Ability to manage health-related needs will improve Outcome: Progressing Goal: Goals will be collaboratively established with patient/family Outcome: Progressing   Problem: Self-Care: Goal: Ability to participate in self-care as condition permits will improve Outcome: Progressing Goal: Verbalization of feelings and concerns over difficulty with self-care will improve Outcome: Progressing Goal: Ability to communicate needs accurately will improve Outcome: Progressing   Problem: Nutrition: Goal: Risk of aspiration will decrease Outcome: Progressing Goal: Dietary intake will improve Outcome: Progressing   Problem: Education: Goal: Knowledge of General Education information will improve Description: Including pain rating scale, medication(s)/side effects and non-pharmacologic comfort measures Outcome: Progressing   Problem: Health Behavior/Discharge Planning: Goal: Ability to manage health-related needs will improve Outcome: Progressing   Problem: Clinical Measurements: Goal: Ability to maintain clinical measurements within normal limits will improve Outcome: Progressing Goal: Will remain free from infection Outcome: Progressing Goal: Diagnostic test results will improve Outcome: Progressing Goal: Respiratory complications will improve Outcome: Progressing Goal: Cardiovascular complication will be  avoided Outcome: Progressing   Problem: Activity: Goal: Risk for activity intolerance will decrease Outcome: Progressing   Problem: Nutrition: Goal: Adequate nutrition will be maintained Outcome: Progressing   Problem: Coping: Goal: Level of anxiety will decrease Outcome: Progressing   Problem: Elimination: Goal: Will not experience complications related to bowel motility Outcome: Progressing Goal: Will not experience complications related to urinary retention Outcome: Progressing   Problem: Pain Managment: Goal: General experience of comfort will improve and/or be controlled Outcome: Progressing   Problem: Safety: Goal: Ability to remain free from injury will improve Outcome: Progressing   Problem: Skin Integrity: Goal: Risk for impaired skin integrity will decrease Outcome: Progressing

## 2024-05-10 NOTE — Progress Notes (Signed)
*  PRELIMINARY RESULTS* Echocardiogram 2D Echocardiogram has been performed.  Christine Leblanc 05/10/2024, 10:37 AM

## 2024-05-10 NOTE — Evaluation (Signed)
 Physical Therapy Evaluation Patient Details Name: Christine Leblanc MRN: [id number] DOB: [birth year] Today's Date: 05/10/2024  History of Present Illness  Christine Leblanc is a 89 y.o. female with medical history significant of history of asthma/COPD, chronic kidney disease stage IIIb, hypothyroidism, GERD, hypertension, hyperlipidemia, history of TIA in the past and degenerative joint disease; who presented to the hospital secondary to left-sided weakness and dysarthria.  Patient was last seen normal around 10:30 PM by her some on 05/08/2024.  When he came to the house this morning noticed patient was falling to the left having mild dysarthria and demonstrating difficulty moving her left upper extremity.  There was also presence of left facial droop (mild).  No complaints of seizure activity, chest pain, fever, nausea, vomiting, dysuria, hematuria, melena/hematochezia, shortness of breath or any other complaints.   Clinical Impression  Patient demonstrates modified independence with multiple verbal and tactile cues for bed mobility, functional transfers and ambulation. Pt still demonstrating slight confusion and agitation. Appreciate nursing team to help change bed while pt ambulating. Pt demonstrates decreased LE strength and impaired balance. Patient also demonstrates difficulty with ambulation during today's session with decreased stride length and velocity noted. Patient also demonstrates modified independence with RW, CGA and RW used for safety. Patient discharged from physical therapy to care of nursing for ambulation daily as tolerated for length of stay.          If plan is discharge home, recommend the following: A little help with walking and/or transfers;A little help with bathing/dressing/bathroom;Assistance with cooking/housework;Assist for transportation;Help with stairs or ramp for entrance;Supervision due to cognitive status   Can travel by private vehicle        Equipment  Recommendations None recommended by PT  Recommendations for Other Services       Functional Status Assessment Patient has had a recent decline in their functional status and demonstrates the ability to make significant improvements in function in a reasonable and predictable amount of time.     Precautions / Restrictions Precautions Precautions: Fall Restrictions Weight Bearing Restrictions Per Provider Order: No      Mobility  Bed Mobility Overal bed mobility: Modified Independent             General bed mobility comments: pt uses elevated HOB, handrails and increased time    Transfers Overall transfer level: Modified independent Equipment used: Rolling walker (2 wheels)               General transfer comment: multiple verbal cues for sliding to EOB and hand placement    Ambulation/Gait Ambulation/Gait assistance: Supervision, Contact guard assist Gait Distance (Feet): 40 Feet Assistive device: Rolling walker (2 wheels) Gait Pattern/deviations: Step-through pattern, Decreased step length - right, Decreased step length - left, Decreased stride length, Trunk flexed Gait velocity: decreased     General Gait Details: pt demonstrates need for multiple verbal cues and some tactile cues for managment of RW and turning around in hallway  Stairs            Wheelchair Mobility     Tilt Bed    Modified Rankin (Stroke Patients Only)       Balance Overall balance assessment: Modified Independent                                           Pertinent Vitals/Pain Pain Assessment Pain Assessment: No/denies pain  Home Living Family/patient expects to be discharged to:: Private residence Living Arrangements: Children Available Help at Discharge: Family;Available 24 hours/day Type of Home: House Home Access: Stairs to enter Entrance Stairs-Rails: None Entrance Stairs-Number of Steps: 1   Home Layout: One level;Laundry or work area in  basement;Able to live on main level with bedroom/bathroom Home Equipment: Agricultural consultant (2 wheels);Cane - single point;Grab bars - tub/shower;Shower seat;BSC/3in1      Prior Function Prior Level of Function : Needs assist       Physical Assist : Mobility (physical);ADLs (physical) Mobility (physical): Bed mobility;Transfers;Gait;Stairs ADLs (physical): Bathing Mobility Comments: household ambulation using RW ADLs Comments: assisted by family for bathing and IADLs     Extremity/Trunk Assessment   Upper Extremity Assessment Upper Extremity Assessment: Overall WFL for tasks assessed    Lower Extremity Assessment Lower Extremity Assessment: Overall WFL for tasks assessed;Generalized weakness    Cervical / Trunk Assessment Cervical / Trunk Assessment: Kyphotic  Communication   Communication Communication: No apparent difficulties Factors Affecting Communication: Reduced clarity of speech    Cognition Arousal: Alert (UTI, one episode of agitation) Behavior During Therapy: Agitated, WFL for tasks assessed/performed   PT - Cognitive impairments: Orientation, History of cognitive impairments                       PT - Cognition Comments: history of UTI, daughter at bedside says she has been stirring for a good portion of the night, taking gown off, taking out IVs Following commands: Intact       Cueing Cueing Techniques: Verbal cues, Tactile cues, Visual cues     General Comments      Exercises     Assessment/Plan    PT Assessment Patient does not need any further PT services  PT Problem List         PT Treatment Interventions      PT Goals (Current goals can be found in the Care Plan section)  Acute Rehab PT Goals Patient Stated Goal: to return home PT Goal Formulation: With patient/family Time For Goal Achievement: 05/10/24 Potential to Achieve Goals: Good    Frequency       Co-evaluation               AM-PAC PT "6 Clicks" Mobility   Outcome Measure Help needed turning from your back to your side while in a flat bed without using bedrails?: A Little Help needed moving from lying on your back to sitting on the side of a flat bed without using bedrails?: A Lot Help needed moving to and from a bed to a chair (including a wheelchair)?: A Little Help needed standing up from a chair using your arms (e.g., wheelchair or bedside chair)?: A Little Help needed to walk in hospital room?: A Little Help needed climbing 3-5 steps with a railing? : A Lot 6 Click Score: 16    End of Session Equipment Utilized During Treatment: Gait belt Activity Tolerance: Patient tolerated treatment well Patient left: in bed;with call bell/phone within reach;with bed alarm set Nurse Communication: Mobility status;Precautions PT Visit Diagnosis: Other abnormalities of gait and mobility (R26.89);Muscle weakness (generalized) (M62.81)    Time: 1610-9604 PT Time Calculation (min) (ACUTE ONLY): 29 min   Charges:   PT Evaluation $PT Eval Low Complexity: 1 Low PT Treatments $Therapeutic Activity: 8-22 mins PT General Charges $$ ACUTE PT VISIT: 1 Visit         Armond Bertin, PT, DPT [facility]  Office: [phone] 8:44 AM, 05/10/24

## 2024-05-10 NOTE — Progress Notes (Signed)
 Progress Note   Patient: Christine Leblanc ZOX:[id number] DOB: [birth year] DOA: 05/09/2024     0 DOS: the patient was seen and examined on 05/10/2024   Brief hospital admission narrative: Christine Leblanc is a 89 y.o. female with medical history significant of history of asthma/COPD, chronic kidney disease stage IIIb, hypothyroidism, GERD, hypertension, hyperlipidemia, history of TIA in the past and degenerative joint disease; who presented to the hospital secondary to left-sided weakness and dysarthria.  Patient was last seen normal around 10:30 PM by her some on 05/08/2024.  When he came to the house this morning noticed patient was falling to the left having mild dysarthria and demonstrating difficulty moving her left upper extremity.  There was also presence of left facial droop (mild). No complaints of seizure activity, chest pain, fever, nausea, vomiting, dysuria, hematuria, melena/hematochezia, shortness of breath or any other complaints.   Workup in the ED demonstrating CT scan of the head negative for acute intracranial normalities and basic metabolic panel demonstrating acute kidney injury and chronic kidney disease stage IIIb.   Case discussed with neurology service through telemedicine who recommended admission for stroke workup; on neurologic examination there was no concern for large vessel occlusion and given worsening renal function contrast will be avoided.  Permissive hypertension, MRI, echo, lipid panel and A1c recommended.  [facility] has been contacted to place patient in the hospital for further evaluation and management.  Assessment and plan Stroke-like symptoms - No hemorrhagic stroke appreciated on CT scan -MRI of the brain not demonstrating acute ischemic stroke - Patient is feeling better not demonstrating facial droop and while still feeling weak on the left side significant improvement in comparison to exam at time of admission. - Echo and carotid Dopplers pending. - TSH 1.466, B12  450; LDL 50 and A1c pending. - Speech therapy, PT/OT evaluation requested - Continue to allow permissive hypertension.  Basis for secondary prevention past that time - Will follow any further recommendation by neurology service - Continue supportive care - Aspirin  and Plavix  for 21 days with subsequent use of aspirin  for secondary prevention.   Acute renal failure superimposed on stage 3b chronic kidney disease (HCC) - Urinalysis suggesting the presence of UTI - Continue empirical treatment with Rocephin  - Minimize nephrotoxic agents and avoid hypotension - Patient advised to maintain adequate hydration - Repeat basic metabolic panel in AM.   COPD (chronic obstructive pulmonary disease) (HCC) - Stable and at baseline - No exacerbation appreciated - Continue bronchodilator management.   Acquired hypothyroidism - TSH within normal limits - Continue Synthroid .   Gastroesophageal reflux disease - Continue PPI.   Essential hypertension - Continue as needed hydralazine  - Work-related ischemic stroke/TIA 48 hours and lowering blood pressure to be as high 200 systolic and 110 diastolic.   Dyslipidemia - Lipid panel updated-stable/at goal - Continue statin.   Other dysphagia - Patient has passed GL swallowing evaluation - Continue dysphagia 1 with thin liquids.   DNR (do not resuscitate) -CODE STATUS has been confirmed with patient's son at bedside and patient's daughter over the phone. - Continue to treat was treatable - No heroic measures or invasive intervention. -Will respect her wishes; DNR/DNI.  Subjective:  Alert, awake and oriented x 3; no facial droop and overall feeling better.  Tolerating dysphagia 1 diet.  Afebrile.  Physical Exam: Vitals:   05/09/24 2236 05/10/24 0109 05/10/24 0552 05/10/24 1300  BP: (!) 167/78  (!) 151/93 (!) 169/82  Pulse: (!) 108  (!) 103 (!) 103  Resp:  18  18   Temp: 99.7 F (37.6 C) 99 F (37.2 C) 99.6 F (37.6 C) 100 F (37.8 C)   TempSrc: Oral Axillary Oral Oral  SpO2: 100%  97% 99%  Weight:      Height:       General exam: Alert, awake, oriented x 3; in no acute distress.  Hard of hearing. Respiratory system: Good saturation on room air; no using accessory muscles. Cardiovascular system: Rate controlled, no rubs, no gallops, no JVD. Gastrointestinal system: Abdomen is nondistended, soft and nontender. No organomegaly or masses felt. Normal bowel sounds heard. Central nervous system: Reporting some left-sided weakness, but improved in comparison to how she fails at time of admission.  No facial droop, no dysarthria and able to move 4 limbs. Extremities: No cyanosis or clubbing; trace edema appreciated bilaterally. Skin: No petechiae. Psychiatry: Judgement and insight appear normal. Mood & affect appropriate.    Latest data Reviewed: Basic metabolic panel: Sodium 133, potassium 3.8, chloride 102, bicarb 21, creatinine 2.6, normal LFTs and GFR 17 TSH: 1.466 B12: 450 A1c: Pending Lipid panel: Total cholesterol 88, HDL 20, LDL 50 and triglycerides 88   Family Communication: Son at bedside.  Disposition: Status is: Inpatient Remains inpatient appropriate because: Completed stroke workup and follow recommendations by physical therapy for safe discharge.  Will follow recommendations by physical therapy/Occupational Therapy; hoping for patient to be able to go home with home health services on 05/11/2024.  Time spent: 35 minutes  Author: Justina Oman, MD 05/10/2024 4:11 PM  For on call review [email or web address].

## 2024-05-10 NOTE — Progress Notes (Signed)
   05/10/24 0552  Vitals  Temp 99.6 F (37.6 C)  Temp Source Oral  BP (!) 151/93  MAP (mmHg) 104  BP Location Left Arm  BP Method Automatic  Patient Position (if appropriate) Sitting  Pulse Rate (!) 103  Pulse Rate Source Monitor  Resp 18  MEWS COLOR  MEWS Score Color Green  Oxygen Therapy  SpO2 97 %  O2 Device Room Air  MEWS Score  MEWS Temp 0  MEWS Systolic 0  MEWS Pulse 1  MEWS RR 0  MEWS LOC 0  MEWS Score 1   Tylenol  suppository offered to patient due to temp and patient refused.

## 2024-05-10 NOTE — Progress Notes (Signed)
   05/10/24 0813  Pre-Screen- If "YES" to any of the following, STOP the screen, keep NPO, and place order for SLP eval and treat.   Home diet required thickened liquids No - Proceed  Trach tube present No - Proceed  Radiation to Head/Neck No - Proceed  Patient Readiness- If "YES" to any of the following, WAIT to screen, keep NPO, and place order for SLP eval and treat. May rescreen if clinical improvement WITHIN 24h.    Is patient lethargic or unable to stay alert/awake? No - Proceed  HOB restricted to <30 degrees No - Proceed  NPO for planned procedure No - Proceed  Aspiration Risk Assessment  Is patient oriented to name, place, or year? Yes - Proceed  Able to open mouth, stick out tongue, or smile Yes - Proceed  Able to seal lips Yes - Proceed  Able to move tongue from side to side Yes - Proceed  Face is symmetric Yes - Proceed  3 oz Water  Challenge  Does the patient stop drinking? No - Proceed  Does the patient cough/choke? No - Proceed  Document PASS or FAIL PASS- Obtain Diet Order   Pts family member asked to redo the [facility] pt is more alert this morning, and passed the study at this time. MD notified

## 2024-05-11 ENCOUNTER — Ambulatory Visit: Admitting: Medical

## 2024-05-11 ENCOUNTER — Encounter (HOSPITAL_BASED_OUTPATIENT_CLINIC_OR_DEPARTMENT_OTHER): Payer: Self-pay

## 2024-05-11 ENCOUNTER — Encounter: Payer: Self-pay | Admitting: Medical

## 2024-05-11 ENCOUNTER — Inpatient Hospital Stay (INDEPENDENT_AMBULATORY_CARE_PROVIDER_SITE_OTHER): Admitting: Medical

## 2024-05-11 VITALS — BP 150/70 | HR 88

## 2024-05-11 DIAGNOSIS — Z66 Do not resuscitate: Secondary | ICD-10-CM | POA: Diagnosis not present

## 2024-05-11 DIAGNOSIS — I10 Essential (primary) hypertension: Secondary | ICD-10-CM

## 2024-05-11 DIAGNOSIS — K219 Gastro-esophageal reflux disease without esophagitis: Secondary | ICD-10-CM | POA: Diagnosis not present

## 2024-05-11 DIAGNOSIS — M7989 Other specified soft tissue disorders: Secondary | ICD-10-CM

## 2024-05-11 DIAGNOSIS — E039 Hypothyroidism, unspecified: Secondary | ICD-10-CM | POA: Diagnosis not present

## 2024-05-11 DIAGNOSIS — D72829 Elevated white blood cell count, unspecified: Secondary | ICD-10-CM | POA: Diagnosis not present

## 2024-05-11 DIAGNOSIS — R1319 Other dysphagia: Secondary | ICD-10-CM | POA: Diagnosis not present

## 2024-05-11 DIAGNOSIS — E785 Hyperlipidemia, unspecified: Secondary | ICD-10-CM | POA: Diagnosis not present

## 2024-05-11 DIAGNOSIS — R2990 Unspecified symptoms and signs involving the nervous system: Secondary | ICD-10-CM | POA: Diagnosis not present

## 2024-05-11 DIAGNOSIS — N179 Acute kidney failure, unspecified: Secondary | ICD-10-CM | POA: Diagnosis not present

## 2024-05-11 DIAGNOSIS — Z8673 Personal history of transient ischemic attack (TIA), and cerebral infarction without residual deficits: Secondary | ICD-10-CM | POA: Diagnosis not present

## 2024-05-11 DIAGNOSIS — Z79899 Other long term (current) drug therapy: Secondary | ICD-10-CM | POA: Diagnosis not present

## 2024-05-11 DIAGNOSIS — N1832 Chronic kidney disease, stage 3b: Secondary | ICD-10-CM | POA: Diagnosis not present

## 2024-05-11 DIAGNOSIS — M79604 Pain in right leg: Secondary | ICD-10-CM

## 2024-05-11 LAB — BASIC METABOLIC PANEL WITH GFR
Anion gap: 7 (ref 5–15)
BUN: 49 mg/dL — ABNORMAL HIGH (ref 8–23)
CO2: 20 mmol/L — ABNORMAL LOW (ref 22–32)
Calcium: 7.8 mg/dL — ABNORMAL LOW (ref 8.9–10.3)
Chloride: 105 mmol/L (ref 98–111)
Creatinine, Ser: 2.71 mg/dL — ABNORMAL HIGH (ref 0.44–1.00)
GFR, Estimated: 16 mL/min — ABNORMAL LOW (ref >60)
Glucose, Bld: 127 mg/dL — ABNORMAL HIGH (ref 70–99)
Potassium: 3.6 mmol/L (ref 3.5–5.1)
Sodium: 132 mmol/L — ABNORMAL LOW (ref 135–145)

## 2024-05-11 LAB — HEMOGLOBIN A1C
Hgb A1c MFr Bld: 5.4 % (ref 4.8–5.6)
Mean Plasma Glucose: 108 mg/dL

## 2024-05-11 MED ORDER — CLONIDINE HCL 0.1 MG PO TABS: 0.1000 mg | ORAL_TABLET | Freq: Every day | ORAL | 1 refills | Status: DC

## 2024-05-11 MED ORDER — CEFADROXIL 500 MG PO CAPS: 500.0000 mg | ORAL_CAPSULE | Freq: Every day | ORAL | 0 refills | Status: DC

## 2024-05-11 MED ORDER — CARVEDILOL 12.5 MG PO TABS: 12.5000 mg | ORAL_TABLET | Freq: Two times a day (BID) | ORAL | 11 refills | Status: DC

## 2024-05-11 MED ORDER — PRAVASTATIN SODIUM 40 MG PO TABS: 40.0000 mg | ORAL_TABLET | Freq: Every day | ORAL | 2 refills | Status: DC

## 2024-05-11 MED ORDER — CLOPIDOGREL BISULFATE 75 MG PO TABS: 75.0000 mg | ORAL_TABLET | Freq: Every day | ORAL | 0 refills | Status: DC

## 2024-05-11 MED ORDER — GUAIFENESIN-DM 100-10 MG/5ML PO SYRP
5.0000 mL | ORAL_SOLUTION | Freq: Three times a day (TID) | ORAL | Status: DC | PRN
  Administered 2024-05-11: 5 mL via ORAL
  Filled 2024-05-11: qty 5

## 2024-05-11 NOTE — Discharge Summary (Signed)
 Physician Discharge Summary   Patient: Christine Leblanc MRN: [id number] DOB: December 19, 1929  Admit date:     05/09/2024  Discharge date: 05/11/24  Discharge Physician: Justina Oman   PCP: Omie Bickers, MD   Recommendations at discharge:  Reassess blood pressure and adjust antihypertensive regimen as needed Repeat basic metabolic panel to follow electrolytes and renal function. Make sure patient follow-up with cardiology as instructed.  Discharge Diagnoses: Principal Problem:   Stroke-like symptoms Active Problems:   COPD (chronic obstructive pulmonary disease) (HCC)   Gastroesophageal reflux disease   Other dysphagia   Dyslipidemia   Essential hypertension   Acquired hypothyroidism   DNR (do not resuscitate)   Acute renal failure superimposed on stage 3b chronic kidney disease [facility])  Brief hospital admission narrative: Christine Leblanc is a 89 y.o. female with medical history significant of history of asthma/COPD, chronic kidney disease stage IIIb, hypothyroidism, GERD, hypertension, hyperlipidemia, history of TIA in the past and degenerative joint disease; who presented to the hospital secondary to left-sided weakness and dysarthria.  Patient was last seen normal around 10:30 PM by her some on 05/08/2024.  When he came to the house this morning noticed patient was falling to the left having mild dysarthria and demonstrating difficulty moving her left upper extremity.  There was also presence of left facial droop (mild). No complaints of seizure activity, chest pain, fever, nausea, vomiting, dysuria, hematuria, melena/hematochezia, shortness of breath or any other complaints.   Workup in the ED demonstrating CT scan of the head negative for acute intracranial normalities and basic metabolic panel demonstrating acute kidney injury and chronic kidney disease stage IIIb.   Case discussed with neurology service through telemedicine who recommended admission for stroke workup; on neurologic  examination there was no concern for large vessel occlusion and given worsening renal function contrast will be avoided.  Permissive hypertension, MRI, echo, lipid panel and A1c recommended.  [facility] has been contacted to place patient in the hospital for further evaluation and management.  Assessment and Plan: Stroke-like symptoms - No hemorrhagic stroke appreciated on CT scan -MRI of the brain not demonstrating acute ischemic stroke - Patient is feeling better not demonstrating facial droop and while still feeling weak on the left side significant improvement in comparison to exam at time of admission. - Echo demonstrating no thrombi and preserved ejection fraction; carotid Dopplers with positive bilateral plaque buildup up to 50% with antegrade flow. - TSH 1.466, B12 450; LDL 50 and A1c pending. - Speech therapy, PT/OT evaluation requested - Continue risk factor modifications and blood pressure control. - Will follow any further recommendation by neurology service - Continue supportive care - Aspirin  and Plavix  for a total of 21 days with subsequent use of aspirin  for secondary prevention.   Acute renal failure superimposed on stage 3b chronic kidney disease (HCC) - Urinalysis suggesting the presence of UTI - Patient will complete treatment with cefadroxil at discharge - Continue to minimize nephrotoxic agents and avoid hypotension - Patient advised to maintain adequate hydration - Repeat basic metabolic panel at follow-up visit to assess renal function trend and stability.   COPD (chronic obstructive pulmonary disease) (HCC) - Stable and at baseline - No exacerbation appreciated - Continue bronchodilator management.   Acquired hypothyroidism - TSH within normal limits - Continue Synthroid .   Gastroesophageal reflux disease - Continue PPI.   Essential hypertension - Continue as needed hydralazine  - Work-related ischemic stroke/TIA 48 hours and lowering blood pressure to be as high  200 systolic and  110 diastolic.   Dyslipidemia - Lipid panel updated-stable/at goal - Continue statin.   Other dysphagia - Patient has passed GL swallowing evaluation - Continue dysphagia 1 with thin liquids.   DNR (do not resuscitate) -CODE STATUS has been confirmed with patient's son at bedside and patient's daughter over the phone. - Continue to treat was treatable - No heroic measures or invasive intervention. -Will respect her wishes; DNR/DNI.   Consultants: Neurology service Procedures performed: See below for x-ray reports. Disposition: Home with home health services. Diet recommendation: Heart healthy diet.  DISCHARGE MEDICATION: Allergies as of 05/11/2024       Reactions   Celecoxib Shortness Of Breath   Dexlansoprazole  Anaphylaxis, Other (See Comments)   abd pain   Famotidine Other (See Comments)   Makes her feel "very bad"        Medication List     STOP taking these medications    Carbinoxamine  Maleate 4 MG Tabs       TAKE these medications    albuterol  108 (90 Base) MCG/ACT inhaler Commonly known as: VENTOLIN  HFA Inhale 1-2 puffs into the lungs every 4 (four) hours as needed for wheezing or shortness of breath.   aspirin  EC 81 MG tablet Take 81 mg by mouth daily. Swallow whole.   carvedilol 12.5 MG tablet Commonly known as: Coreg Take 1 tablet (12.5 mg total) by mouth 2 (two) times daily.   cefadroxil 500 MG capsule Commonly known as: DURICEF Take 1 capsule (500 mg total) by mouth daily for 3 days.   cloNIDine  0.1 MG tablet Commonly known as: CATAPRES  Take 1 tablet (0.1 mg total) by mouth daily. What changed:  when to take this reasons to take this   clopidogrel  75 MG tablet Commonly known as: PLAVIX  Take 1 tablet (75 mg total) by mouth daily for 19 days. Start taking on: May 12, 2024   diazepam  5 MG tablet Commonly known as: VALIUM  Take 2.5 mg by mouth daily as needed for anxiety.   ferrous sulfate  325 (65 FE) MG tablet Take  325 mg by mouth. Takes one on Monday, Wednesday and Friday   fluticasone  50 MCG/ACT nasal spray Commonly known as: FLONASE  Place 2 sprays into both nostrils daily.   levothyroxine  88 MCG tablet Commonly known as: SYNTHROID  TAKE 1 TABLET DAILY ON MONDAY THROUGH SATURDAY AND ONE-HALF (1/2) TABLET ON SUNDAY What changed: See the new instructions.   pantoprazole  40 MG tablet Commonly known as: PROTONIX  Take 1 tablet (40 mg total) by mouth 2 (two) times daily before a meal.   pravastatin  40 MG tablet Commonly known as: PRAVACHOL  Take 1 tablet (40 mg total) by mouth daily.   senna 8.6 MG tablet Commonly known as: SENOKOT Take 2 tablets by mouth at bedtime.   sodium chloride  0.9 % nebulizer solution Take 3 mLs by nebulization daily as needed for wheezing.        Follow-up Information     Omie Bickers, MD. Schedule an appointment as soon as possible for a visit in 10 day(s).   Specialty: Internal Medicine Contact information: [address] [facility] [facility] [address] [phone]                Discharge Exam: Filed Weights   05/09/24 1914  Weight: 53 kg   General exam: Alert, awake, oriented x 3; in no acute distress.  Hard of hearing. Respiratory system: Good saturation on room air; no using accessory muscles. Cardiovascular system: Rate controlled, no rubs, no gallops, no  JVD. Gastrointestinal system: Abdomen is nondistended, soft and nontender. No organomegaly or masses felt. Normal bowel sounds heard. Central nervous system: Reporting some left-sided weakness, but improved in comparison to how she fails at time of admission.  No facial droop, no dysarthria and able to move 4 limbs. Extremities: No cyanosis or clubbing; trace edema appreciated bilaterally. Skin: No petechiae. Psychiatry: Judgement and insight appear normal. Mood & affect appropriate.   Condition at discharge: Stable and improved.  The results of significant diagnostics from this  hospitalization (including imaging, microbiology, ancillary and laboratory) are listed below for reference.   Imaging Studies: ECHOCARDIOGRAM COMPLETE Result Date: 05/10/2024    ECHOCARDIOGRAM REPORT   Patient Name:   Christine Leblanc Date of Exam: 05/10/2024 Medical Rec #:  [id number]      Height:       61.0 in Accession #:    [id number]     Weight:       116.8 lb Date of Birth:  20-May-1929      BSA:          1.503 m Patient Age:    89 years       BP:           151/93 mmHg Patient Gender: F              HR:           104 bpm. Exam Location:  [facility] Procedure: 2D Echo, Cardiac Doppler and Color Doppler (Both Spectral and Color            Flow Doppler were utilized during procedure). Indications:    Stroke l63.9  History:        Patient has prior history of Echocardiogram examinations, most                 recent 01/07/2024. TIA and COPD, Signs/Symptoms:Syncope; Risk                 Factors:Hypertension and Dyslipidemia.  Sonographer:    Denese Finn RCS Referring Phys: [phone] Dillion Stowers IMPRESSIONS  1. Left ventricular ejection fraction, by estimation, is 70 to 75%. The left ventricle has hyperdynamic function. The left ventricle has no regional wall motion abnormalities. Left ventricular diastolic parameters are consistent with Grade I diastolic dysfunction (impaired relaxation).  2. Right ventricular systolic function is hyperdynamic. The right ventricular size is normal.  3. Left atrial size was mildly dilated.  4. The mitral valve is grossly normal. Trivial mitral valve regurgitation. No evidence of mitral stenosis.  5. The aortic valve was not well visualized. Aortic valve regurgitation is not visualized. No aortic stenosis is present. Comparison(s): Prior images reviewed side by side. Function is more vigorous from prior. FINDINGS  Left Ventricle: No strain or 3D transmitted. Left ventricular ejection fraction, by estimation, is 70 to 75%. The left ventricle has hyperdynamic function. The left ventricle  has no regional wall motion abnormalities. Strain was performed and the global  longitudinal strain is indeterminate. The left ventricular internal cavity size was normal in size. Suboptimal image quality limits for assessment of left ventricular hypertrophy. Left ventricular diastolic parameters are consistent with Grade I diastolic dysfunction (impaired relaxation). Right Ventricle: The right ventricular size is normal. No increase in right ventricular wall thickness. Right ventricular systolic function is hyperdynamic. Left Atrium: Left atrial size was mildly dilated. Right Atrium: Right atrial size was normal in size. Prominent Eustachian valve. Pericardium: There is no evidence of pericardial effusion. Presence of epicardial fat layer.  Mitral Valve: The mitral valve is grossly normal. Trivial mitral valve regurgitation. No evidence of mitral valve stenosis. Tricuspid Valve: The tricuspid valve is normal in structure. Tricuspid valve regurgitation is not demonstrated. No evidence of tricuspid stenosis. Aortic Valve: The aortic valve was not well visualized. Aortic valve regurgitation is not visualized. No aortic stenosis is present. Pulmonic Valve: The pulmonic valve was normal in structure. Pulmonic valve regurgitation is not visualized. No evidence of pulmonic stenosis. Aorta: The aortic root is normal in size and structure and the ascending aorta was not well visualized. IAS/Shunts: The atrial septum is grossly normal. Additional Comments: 3D was performed not requiring image post processing on an independent workstation and was indeterminate.  LEFT VENTRICLE PLAX 2D LVIDd:         3.60 cm   Diastology LVIDs:         2.00 cm   LV e' medial:    7.18 cm/s LV PW:         1.30 cm   LV E/e' medial:  12.2 LV IVS:        1.30 cm   LV e' lateral:   6.53 cm/s LVOT diam:     1.70 cm   LV E/e' lateral: 13.4 LV SV:         39 LV SV Index:   26 LVOT Area:     2.27 cm  RIGHT VENTRICLE RV S prime:     15.40 cm/s TAPSE  (M-mode): 2.1 cm LEFT ATRIUM             Index        RIGHT ATRIUM          Index LA diam:        3.00 cm 2.00 cm/m   RA Area:     9.70 cm LA Vol (A2C):   46.3 ml 30.80 ml/m  RA Volume:   17.20 ml 11.44 ml/m LA Vol (A4C):   47.8 ml 31.80 ml/m LA Biplane Vol: 49.5 ml 32.93 ml/m  AORTIC VALVE LVOT Vmax:   93.80 cm/s LVOT Vmean:  60.400 cm/s LVOT VTI:    0.171 m  AORTA Ao Root diam: 3.10 cm MITRAL VALVE MV Area (PHT): 4.15 cm     SHUNTS MV Decel Time: 183 msec     Systemic VTI:  0.17 m MR Peak grad: 147.1 mmHg    Systemic Diam: 1.70 cm MR Mean grad: 81.0 mmHg MR Vmax:      606.50 cm/s MR Vmean:     399.5 cm/s MV E velocity: 87.30 cm/s MV A velocity: 116.00 cm/s MV E/A ratio:  0.75 Gloriann Larger MD Electronically signed by Gloriann Larger MD Signature Date/Time: 05/10/2024/11:19:52 AM    Final    US  Carotid Bilateral (at [facility] and [facility] only) Result Date: 05/10/2024 CLINICAL DATA:  Neurologic deficit, hypertension, hyperlipidemia and syncope. EXAM: BILATERAL CAROTID DUPLEX ULTRASOUND TECHNIQUE: Martina Sledge scale imaging, color Doppler and duplex ultrasound were performed of bilateral carotid and vertebral arteries in the neck. COMPARISON:  Prior study on 01/07/2024 FINDINGS: Criteria: Quantification of carotid stenosis is based on velocity parameters that correlate the residual internal carotid diameter with NASCET-based stenosis levels, using the diameter of the distal internal carotid lumen as the denominator for stenosis measurement. The following velocity measurements were obtained: RIGHT ICA:  78/14 cm/sec CCA:  66/10 cm/sec SYSTOLIC ICA/CCA RATIO:  1.6 ECA:  91 cm/sec LEFT ICA:  79/16 cm/sec CCA:  61/9 cm/sec SYSTOLIC ICA/CCA RATIO:  1.3 ECA:  105 cm/sec RIGHT  CAROTID ARTERY: Similar appearance of calcified and noncalcified plaque at the level of the carotid bulb and proximal right ICA. Estimated right ICA stenosis is less than 50%. RIGHT VERTEBRAL ARTERY: Antegrade flow with normal waveform and  velocity. LEFT CAROTID ARTERY: Similar appearance of mild calcified plaque at the level of the left carotid bulb and proximal left ICA. Estimated left ICA stenosis is less than 50%. LEFT VERTEBRAL ARTERY: Antegrade flow with normal waveform and velocity. IMPRESSION: Stable appearance of bilateral carotid bulb and proximal ICA plaque, right greater than left. Estimated bilateral ICA stenoses are less than 50%. Electronically Signed   By: Erica Hau M.D.   On: 05/10/2024 10:54   MR BRAIN WO CONTRAST Result Date: 05/09/2024 CLINICAL DATA:  Initial evaluation for acute neuro deficit, stroke suspected. EXAM: MRI HEAD WITHOUT CONTRAST TECHNIQUE: Multiplanar, multiecho pulse sequences of the brain and surrounding structures were obtained without intravenous contrast. COMPARISON:  CT from earlier the same day. FINDINGS: Brain: Examination is limited as the patient was unable to tolerate the full length of the exam. Axial and coronal DWI images only were performed. Diffusion-weighted imaging demonstrates no abnormal foci of restricted diffusion to suggest acute or subacute ischemia. No visible areas of chronic cortical infarction. No visible mass lesion, midline shift, or mass effect. No hydrocephalus. No visible extra-axial fluid collection. Underlying age-related cerebral atrophy noted. Vascular: Not assessed on this limited exam. Skull and upper cervical spine: Not assessed on this limited exam. Sinuses/Orbits: Not assessed on this limited exam. Other: None. IMPRESSION: 1. Limited exam with only axial and coronal DWI sequences performed. 2. No evidence for acute or subacute ischemia. Electronically Signed   By: Virgia Griffins M.D.   On: 05/09/2024 18:06   DG Chest Port 1 View Result Date: 05/09/2024 CLINICAL DATA:  Shortness of breath. EXAM: PORTABLE CHEST 1 VIEW COMPARISON:  04/22/2024 FINDINGS: New patchy opacity at the left lung base, possible small left pleural effusion. Stable heart size and  mediastinal contours. Aortic atherosclerosis. No pulmonary edema or pneumothorax. Bilateral humeral arthroplasties. IMPRESSION: New patchy opacity at the left lung base, possible small left pleural effusion. Findings are suspicious for pneumonia. Electronically Signed   By: Chadwick Colonel M.D.   On: 05/09/2024 16:34   CT HEAD CODE STROKE WO CONTRAST Result Date: 05/09/2024 CLINICAL DATA:  Code stroke.  Left-sided weakness EXAM: CT HEAD WITHOUT CONTRAST TECHNIQUE: Contiguous axial images were obtained from the base of the skull through the vertex without intravenous contrast. RADIATION DOSE REDUCTION: This exam was performed according to the departmental dose-optimization program which includes automated exposure control, adjustment of the mA and/or kV according to patient size and/or use of iterative reconstruction technique. COMPARISON:  Five days prior FINDINGS: Brain: No evidence of acute infarction, hemorrhage, hydrocephalus, extra-axial collection or mass lesion/mass effect. Low-density in the cerebral white matter attributed to chronic small vessel ischemia. Chronic lacunar infarct or dilated perivascular space at the external capsule/posterior putamen on the right. Vascular: No hyperdense vessel or unexpected calcification. Skull: Normal. Negative for fracture or focal lesion. Sinuses/Orbits: No acute finding. Other: Intermittent motion artifact. ASPECTS [facility] Stroke Program Early CT Score) - Ganglionic level infarction (caudate, lentiform nuclei, internal capsule, insula, M1-M3 cortex): 7 - Supraganglionic infarction (M4-M6 cortex): 3 Total score (0-10 with 10 being normal): 10 IMPRESSION: 1. Aging brain without acute or interval finding. 2. Intermittent motion artifact. Electronically Signed   By: Ronnette Coke M.D.   On: 05/09/2024 12:43   CT HEAD WO CONTRAST ( ) Result Date: 05/04/2024 CLINICAL  DATA:  Headache, increasing frequency or severity. Blood pressure 199/88. EXAM: CT HEAD WITHOUT  CONTRAST TECHNIQUE: Contiguous axial images were obtained from the base of the skull through the vertex without intravenous contrast. RADIATION DOSE REDUCTION: This exam was performed according to the departmental dose-optimization program which includes automated exposure control, adjustment of the mA and/or kV according to patient size and/or use of iterative reconstruction technique. COMPARISON:  CT head 04/22/2024 FINDINGS: Brain: No intracranial hemorrhage, mass effect, or evidence of acute infarct. No hydrocephalus. No extra-axial fluid collection. Advanced cerebral atrophy and chronic small vessel ischemic disease. Chronic right basal ganglia infarct. Vascular: No hyperdense vessel. Intracranial arterial calcification. Skull: No fracture or focal lesion. Sinuses/Orbits: No acute finding. Other: None. IMPRESSION: 1. No acute intracranial abnormality. Electronically Signed   By: Rozell Cornet M.D.   On: 05/04/2024 23:41   CT Head Wo Contrast Result Date: 04/22/2024 CLINICAL DATA:  Altered mental status EXAM: CT HEAD WITHOUT CONTRAST TECHNIQUE: Contiguous axial images were obtained from the base of the skull through the vertex without intravenous contrast. RADIATION DOSE REDUCTION: This exam was performed according to the departmental dose-optimization program which includes automated exposure control, adjustment of the mA and/or kV according to patient size and/or use of iterative reconstruction technique. COMPARISON:  03/04/2024 FINDINGS: Brain: There is no mass, hemorrhage or extra-axial collection. There is generalized atrophy without lobar predilection. Hypodensity of the white matter is most commonly associated with chronic microvascular disease. Old right basal ganglia small vessel infarct. Vascular: Atherosclerotic calcification of the internal carotid arteries at the skull base. No abnormal hyperdensity of the major intracranial arteries or dural venous sinuses. Skull: The visualized skull base,  calvarium and extracranial soft tissues are normal. Sinuses/Orbits: No fluid levels or advanced mucosal thickening of the visualized paranasal sinuses. No mastoid or middle ear effusion. Normal orbits. Other: None. IMPRESSION: 1. No acute intracranial abnormality. 2. Generalized atrophy and findings of chronic microvascular disease. 3. Old right basal ganglia small vessel infarct. Electronically Signed   By: Juanetta Nordmann M.D.   On: 04/22/2024 02:17   DG Chest 2 View Result Date: 04/22/2024 CLINICAL DATA:  Altered mental status and elevated blood pressure. EXAM: CHEST - 2 VIEW COMPARISON:  None Available. FINDINGS: The heart size and mediastinal contours are within normal limits. Multiple surgical clips are seen within the superior mediastinum. There is marked severity calcification of the aortic arch. Mild, diffuse, chronic appearing increased interstitial lung markings are noted. Adjacent ill-defined 4 mm nodular opacities are seen overlying the mid right lung. These are not visualized on the prior study. There is no evidence of focal consolidation, pleural effusion or pneumothorax. There is evidence of prior bilateral shoulder arthroplasty. Multilevel degenerative changes are present throughout the thoracic spine. The visualized skeletal structures are unremarkable. IMPRESSION: Stable chronic and postoperative changes without evidence of acute cardiopulmonary disease. Electronically Signed   By: Virgle Grime M.D.   On: 04/22/2024 02:08    Microbiology: Results for orders placed or performed during the hospital encounter of 03/04/24  Blood Culture (routine x 2)     Status: None   Collection Time: 03/04/24  4:30 PM   Specimen: BLOOD LEFT FOREARM  Result Value Ref Range Status   Specimen Description BLOOD LEFT FOREARM BOTTLES DRAWN AEROBIC ONLY  Final   Special Requests Blood Culture adequate volume  Final   Culture   Final    NO GROWTH 5 DAYS Performed at [facility], [address]  [place], [place] [address]  Report Status 03/09/2024 FINAL  Final  Resp panel by RT-PCR (RSV, Flu A&B, Covid) Anterior Nasal Swab     Status: Abnormal   Collection Time: 03/04/24  4:38 PM   Specimen: Anterior Nasal Swab  Result Value Ref Range Status   SARS Coronavirus 2 by RT PCR NEGATIVE NEGATIVE Final    Comment: (NOTE) SARS-CoV-2 target nucleic acids are NOT DETECTED.  The SARS-CoV-2 RNA is generally detectable in upper respiratory specimens during the acute phase of infection. The lowest concentration of SARS-CoV-2 viral copies this assay can detect is 138 copies/mL. A negative result does not preclude SARS-Cov-2 infection and should not be used as the sole basis for treatment or other patient management decisions. A negative result may occur with  improper specimen collection/handling, submission of specimen other than nasopharyngeal swab, presence of viral mutation(s) within the areas targeted by this assay, and inadequate number of viral copies(<138 copies/mL). A negative result must be combined with clinical observations, patient history, and epidemiological information. The expected result is Negative.  Fact Sheet for Patients:  BloggerCourse.com  Fact Sheet for Healthcare Providers:  SeriousBroker.it  This test is no t yet approved or cleared by the [place]  FDA and  has been authorized for detection and/or diagnosis of SARS-CoV-2 by FDA under an Emergency Use Authorization (EUA). This EUA will remain  in effect (meaning this test can be used) for the duration of the COVID-19 declaration under Section 564(b)(1) of the Act, 21 U.S.C.section 360bbb-3(b)(1), unless the authorization is terminated  or revoked sooner.       Influenza A by PCR POSITIVE (A) NEGATIVE Final   Influenza B by PCR NEGATIVE NEGATIVE Final    Comment: (NOTE) The Xpert Xpress SARS-CoV-2/FLU/RSV plus assay is intended as an aid in the diagnosis  of influenza from Nasopharyngeal swab specimens and should not be used as a sole basis for treatment. Nasal washings and aspirates are unacceptable for Xpert Xpress SARS-CoV-2/FLU/RSV testing.  Fact Sheet for Patients: BloggerCourse.com  Fact Sheet for Healthcare Providers: SeriousBroker.it  This test is not yet approved or cleared by the [place]  FDA and has been authorized for detection and/or diagnosis of SARS-CoV-2 by FDA under an Emergency Use Authorization (EUA). This EUA will remain in effect (meaning this test can be used) for the duration of the COVID-19 declaration under Section 564(b)(1) of the Act, 21 U.S.C. section 360bbb-3(b)(1), unless the authorization is terminated or revoked.     Resp Syncytial Virus by PCR NEGATIVE NEGATIVE Final    Comment: (NOTE) Fact Sheet for Patients: BloggerCourse.com  Fact Sheet for Healthcare Providers: SeriousBroker.it  This test is not yet approved or cleared by the [place]  FDA and has been authorized for detection and/or diagnosis of SARS-CoV-2 by FDA under an Emergency Use Authorization (EUA). This EUA will remain in effect (meaning this test can be used) for the duration of the COVID-19 declaration under Section 564(b)(1) of the Act, 21 U.S.C. section 360bbb-3(b)(1), unless the authorization is terminated or revoked.  Performed at [facility], [address] [place], [place] [address]   Blood Culture (routine x 2)     Status: None   Collection Time: 03/04/24  5:28 PM   Specimen: BLOOD  Result Value Ref Range Status   Specimen Description BLOOD BLOOD LEFT HAND BOTTLES DRAWN AEROBIC ONLY  Final   Special Requests   Final    Blood Culture results may not be optimal due to an inadequate volume of blood received in culture bottles   Culture  Final    NO GROWTH 5 DAYS Performed at [facility], [address],  [place], [place] [address]    Report Status 03/09/2024 FINAL  Final  Urine Culture     Status: Abnormal   Collection Time: 03/04/24  8:48 PM   Specimen: Urine, Random  Result Value Ref Range Status   Specimen Description   Final    URINE, RANDOM Performed at [facility], [address] [place], [place] [address]    Special Requests   Final    NONE Reflexed from [id number] Performed at [facility], [address], [place], [place] [address]    Culture 30,000 COLONIES/mL STAPHYLOCOCCUS EPIDERMIDIS (A)  Final   Report Status 03/07/2024 FINAL  Final   Organism ID, Bacteria STAPHYLOCOCCUS EPIDERMIDIS (A)  Final      Susceptibility   Staphylococcus epidermidis - MIC*    CIPROFLOXACIN >=8 RESISTANT Resistant     GENTAMICIN <=0.5 SENSITIVE Sensitive     NITROFURANTOIN <=16 SENSITIVE Sensitive     OXACILLIN >=4 RESISTANT Resistant     TETRACYCLINE 2 SENSITIVE Sensitive     VANCOMYCIN 1 SENSITIVE Sensitive     TRIMETH/SULFA <=10 SENSITIVE Sensitive     RIFAMPIN <=0.5 SENSITIVE Sensitive     Inducible Clindamycin  NEGATIVE Sensitive     * 30,000 COLONIES/mL STAPHYLOCOCCUS EPIDERMIDIS    Labs: CBC: Recent Labs  Lab 05/05/24 0107 05/09/24 1301 05/10/24 1744  WBC 8.0 17.2* 16.0*  NEUTROABS  --  15.5*  --   HGB 8.6* 10.1* 9.3*  HCT 26.2* 31.6* 28.2*  MCV 93.2 92.1 89.8  PLT 150 138* 122*   Basic Metabolic Panel: Recent Labs  Lab 05/05/24 0107 05/09/24 1301 05/11/24 0412  NA 140 133* 132*  K 3.3* 3.8 3.6  CL 109 102 105  CO2 23 21* 20*  GLUCOSE 102* 93 127*  BUN 21 35* 49*  CREATININE 1.14* 2.61* 2.71*  CALCIUM  7.5* 8.5* 7.8*   Liver Function Tests: Recent Labs  Lab 05/09/24 1301  AST 36  ALT 16  ALKPHOS 94  BILITOT 1.1  PROT 6.6  ALBUMIN 2.9*   CBG: Recent Labs  Lab 05/09/24 1227  GLUCAP 77    Discharge time spent: greater than 30 minutes.  Signed: Justina Oman, MD [facility] 05/11/2024

## 2024-05-11 NOTE — Evaluation (Signed)
 Occupational Therapy Evaluation Patient Details Name: Christine Leblanc MRN: [id number] DOB: 1929-07-20 Today's Date: 05/11/2024   History of Present Illness   Christine Leblanc is a 89 y.o. female with medical history significant of history of asthma/COPD, chronic kidney disease stage IIIb, hypothyroidism, GERD, hypertension, hyperlipidemia, history of TIA in the past and degenerative joint disease; who presented to the hospital secondary to left-sided weakness and dysarthria.  Patient was last seen normal around 10:30 PM by her some on 05/08/2024.  When he came to the house this morning noticed patient was falling to the left having mild dysarthria and demonstrating difficulty moving her left upper extremity.  There was also presence of left facial droop (mild).  No complaints of seizure activity, chest pain, fever, nausea, vomiting, dysuria, hematuria, melena/hematochezia, shortness of breath or any other complaints.     Workup in the ED demonstrating CT scan of the head negative for acute intracranial normalities and basic metabolic panel demonstrating acute kidney injury and chronic kidney disease stage IIIb.     Case discussed with neurology service through telemedicine who recommended admission for stroke workup; on neurologic examination there was no concern for large vessel occlusion and given worsening renal function contrast will be avoided.  Permissive hypertension, MRI, echo, lipid panel and A1c recommended.  TRH has been contacted to place patient in the hospital for further evaluation and management. (per MD)     Clinical Impressions Pt agreeable to OT evaluation. Pt has 24/7 assist at baseline. Difficult to assess given mild cognitive issues and possibility of prior weakness given age. L UE demonstrated less A/ROM at shoulder and possibly increase weakness at the wrist. CGA to min A for mobility with and without AD for [facility] transfer. Daughter present and reported feeling able to care for the pt at  home. Pt left in the bed with call bell within reach and family present. Pt is not recommended for further acute OT services and will be discharged to care of nursing staff for remaining length of stay.      If plan is discharge home, recommend the following:   A little help with walking and/or transfers;A lot of help with bathing/dressing/bathroom;Assistance with cooking/housework;Assist for transportation;Help with stairs or ramp for entrance;Direct supervision/assist for medications management     Functional Status Assessment   Patient has had a recent decline in their functional status and demonstrates the ability to make significant improvements in function in a reasonable and predictable amount of time.       Precautions/Restrictions   Precautions Precautions: Fall Recall of Precautions/Restrictions: Impaired Restrictions Weight Bearing Restrictions Per Provider Order: No     Mobility Bed Mobility Overal bed mobility: Needs Assistance Bed Mobility: Supine to Sit, Sit to Supine     Supine to sit: Supervision, HOB elevated Sit to supine: Supervision, Contact guard assist   General bed mobility comments: assit to scoot in bed once supine; slow labored movement    Transfers Overall transfer level: Needs assistance Equipment used: Rolling walker (2 wheels) Transfers: Sit to/from Stand, Bed to chair/wheelchair/BSC Sit to Stand: Contact guard assist     Step pivot transfers: Contact guard assist, Min assist     General transfer comment: labored movement; RW used to [facility] but not back from [facility]      Balance Overall balance assessment: Needs assistance Sitting-balance support: No upper extremity supported, Feet supported Sitting balance-Leahy Scale: Fair Sitting balance - Comments: posterior lean seated at EOB Postural control: Posterior lean Standing balance support:  During functional activity, No upper extremity supported Standing balance-Leahy Scale:  Poor Standing balance comment: poor to fair without AD; fair with AD                           ADL either performed or assessed with clinical judgement   ADL Overall ADL's : Needs assistance/impaired     Grooming: Set up;Sitting   Upper Body Bathing: Set up;Minimal assistance;Sitting   Lower Body Bathing: Maximal assistance;Total assistance;Sitting/lateral leans   Upper Body Dressing : Set up;Minimal assistance;Sitting   Lower Body Dressing: Maximal assistance;Total assistance;Sitting/lateral leans   Toilet Transfer: Minimal assistance;Contact guard assist;Rolling walker (2 wheels);Stand-pivot;BSC/3in1 Statistician Details (indicate cue type and reason): EOB to [facility] and back with RW to start and no RW going back to bed. Toileting- Clothing Manipulation and Hygiene: Contact guard assist;Sit to/from stand Toileting - Clothing Manipulation Details (indicate cue type and reason): Pt completed peri-care while standing from the [facility].     Functional mobility during ADLs: Contact guard assist;Rolling walker (2 wheels)       Vision Baseline Vision/History: 1 Wears glasses Ability to See in Adequate Light: 1 Impaired Patient Visual Report: No change from baseline Vision Assessment?: No apparent visual deficits     Perception Perception: Not tested       Praxis Praxis: Not tested       Pertinent Vitals/Pain Pain Assessment Pain Assessment: No/denies pain     Extremity/Trunk Assessment Upper Extremity Assessment Upper Extremity Assessment: RUE deficits/detail;LUE deficits/detail RUE Deficits / Details: Generalized weakness; 3-/5 MMT shoulder flexion; difficulty following FM coordination commands. RUE Sensation: WNL LUE Deficits / Details: 3-/5 with Less than 75% available A/ROM for shoulder flexion. Difficult to assess given pt's mild difficulty following commands. 3+/5 L wrist extension. LUE Sensation: WNL LUE Coordination: decreased fine motor   Lower  Extremity Assessment Lower Extremity Assessment: Defer to PT evaluation   Cervical / Trunk Assessment Cervical / Trunk Assessment: Kyphotic   Communication Communication Communication: Impaired Factors Affecting Communication: Reduced clarity of speech   Cognition Arousal: Alert Behavior During Therapy: WFL for tasks assessed/performed Cognition: Cognition impaired             OT - Cognition Comments: Little difficulty following commands for MMT and fine motor skills.                 Following commands: Impaired Following commands impaired: Follows one step commands inconsistently     Cueing  General Comments   Cueing Techniques: Verbal cues;Tactile cues                 Home Living Family/patient expects to be discharged to:: Private residence Living Arrangements: Children Available Help at Discharge: Family;Available 24 hours/day Type of Home: House Home Access: Stairs to enter [employer] of Steps: 1 Entrance Stairs-Rails: None Home Layout: One level;Laundry or work area in basement;Able to live on main level with bedroom/bathroom     Bathroom Shower/Tub: Chief Strategy Officer: Standard Bathroom Accessibility: Yes How Accessible: Accessible via walker Home Equipment: Agricultural consultant (2 wheels);Cane - single point;Grab bars - tub/shower;Shower seat;BSC/3in1          Prior Functioning/Environment Prior Level of Function : Needs assist           ADLs (physical): Bathing;IADLs Mobility Comments: household ambulation using RW ADLs Comments: assisted by family for bathing and IADLs  OT Goals(Current goals can be found in the care plan section)   Acute Rehab OT Goals Patient Stated Goal: improve function at home OT Goal Formulation: With patient/family                                        End of Session Equipment Utilized During Treatment: Rolling walker (2 wheels)  Activity  Tolerance: Patient tolerated treatment well Patient left: in bed;with call bell/phone within reach;with family/visitor present  OT Visit Diagnosis: Muscle weakness (generalized) (M62.81);Other symptoms and signs involving the nervous system (R29.898)                Time: 1610-9604 OT Time Calculation (min): 16 min Charges:  OT General Charges $OT Visit: 1 Visit OT Evaluation $OT Eval Low Complexity: 1 Low  Kristeen Lantz OT, MOT  Thurnell Floss 05/11/2024, 9:43 AM

## 2024-05-11 NOTE — Plan of Care (Signed)
   Problem: Ischemic Stroke/TIA Tissue Perfusion: Goal: Complications of ischemic stroke/TIA will be minimized Outcome: Progressing

## 2024-05-11 NOTE — TOC Initial Note (Addendum)
 Transition of Care [facility]) - Initial/Assessment Note    Patient Details  Name: Christine Leblanc MRN: [id number] Date of Birth: February 19, 1929  [facility]) CM/SW Contact:    Grandville Lax, LCSWA Phone Number: 05/11/2024, 11:04 AM  Clinical Narrative:                 Pt is high risk for readmission. CSW spoke with pts son at bedside while pt slept to complete assessment. Pt lives with her son. Pt can do some ADLs but her son offers assistance when needed. Pts son provides transportation. Pt has HH with [facility], CSW spoke to [place] with [facility] who states pt is open with HH PT, CSW requested HH orders be placed by MD. Pt has a walker to use in the home. TOC to follow.   Expected Discharge Plan: Home w Home Health Services Barriers to Discharge: Barriers Resolved   Patient Goals and CMS Choice Patient states their goals for this hospitalization and ongoing recovery are:: return home CMS Medicare.gov Compare Post Acute Care list provided to:: Patient Choice offered to / list presented to : Patient      Expected Discharge Plan and Services In-house Referral: Clinical Social Work Discharge Planning Services: CM Consult   Living arrangements for the past 2 months: Single Family Home                           HH Arranged: PT [facility] Date HH Agency Contacted: 05/11/24   Representative spoke with at [facility] Agency: Renetta Carter  Prior Living Arrangements/Services Living arrangements for the past 2 months: Single Family Home Lives with:: Adult Children Patient language and need for interpreter reviewed:: Yes Do you feel safe going back to the place where you live?: Yes      Need for Family Participation in Patient Care: Yes (Comment) Care giver support system in place?: Yes (comment) Current home services: DME Criminal Activity/Legal Involvement Pertinent to Current Situation/Hospitalization: No - Comment as needed  Activities of Daily Living    ADL Screening (condition at time of admission) Independently performs ADLs?: No Does the patient have a NEW difficulty with bathing/dressing/toileting/self-feeding that is expected to last >3 days?: No Does the patient have a NEW difficulty with getting in/out of bed, walking, or climbing stairs that is expected to last >3 days?: No Does the patient have a NEW difficulty with communication that is expected to last >3 days?: No Is the patient deaf or have difficulty hearing?: Yes Does the patient have difficulty seeing, even when wearing glasses/contacts?: No Does the patient have difficulty concentrating, remembering, or making decisions?: No  Permission Sought/Granted                  Emotional Assessment Appearance:: Appears stated age Attitude/Demeanor/Rapport: Engaged Affect (typically observed): Accepting   Alcohol / Substance Use: Not Applicable Psych Involvement: No (comment)  Admission diagnosis:  Acute ischemic stroke (HCC) [I63.9] Stroke-like symptoms [R29.90] Patient Active Problem List   Diagnosis Date Noted   Stroke-like symptoms 05/09/2024   DNR (do not resuscitate) 05/09/2024   Acute renal failure superimposed on stage 3b chronic kidney disease (HCC) 05/09/2024   Encephalopathy 03/06/2024   Hypoalbuminemia due to protein-calorie malnutrition (HCC) 03/05/2024   Influenza A 03/04/2024   TIA (transient ischemic attack) 01/06/2024   SIRS (systemic inflammatory response syndrome) (HCC) 12/09/2023   CAP (community acquired pneumonia) 12/09/2023   Chronic cough 10/03/2023   Dysphagia  08/15/2023   Protein-calorie malnutrition, mild (HCC) 06/22/2022   Acute bronchitis 06/22/2022   Iron deficiency anemia 06/22/2022   COPD (chronic obstructive pulmonary disease) (HCC) 06/22/2022   Loss of weight 10/09/2021   Pharyngeal dysphagia 10/09/2021   Mild carotid artery disease (HCC) 05/19/2020   History of TIA (transient ischemic attack) 12/25/2018   Generalized weakness  09/30/2018   Hypokalemia    MGUS (monoclonal gammopathy of unknown significance) 10/21/2017   Primary localized osteoarthrosis of right shoulder 06/21/2015   S/P shoulder replacement 06/21/2015   Oropharyngeal dysphagia 11/29/2014   Osteoarthritis of right knee 07/27/2014   Postsurgical hypothyroidism 04/22/2014   Acquired hypothyroidism 10/12/2013   Dehydration 08/02/2012   Syncope and collapse    Anemia, normocytic normochromic 04/26/2012   Gastroesophageal reflux disease    Essential hypertension 12/07/2011   Leg pain, bilateral 04/27/2011   Dyslipidemia 04/27/2011   Other dysphagia 06/07/2010   PCP:  Omie Bickers, MD Pharmacy:   [facility] [phone] - [place] - [address] AT [facility] [address] [place] [address] Phone: [phone] Fax: [phone]  [facility] - [place], [place] - [address] [place] [address] Phone: [phone] Fax: [phone]     Social Drivers of Health (SDOH) Social History: SDOH Screenings   Food Insecurity: Patient Unable To Answer (05/09/2024)  Housing: Patient Unable To Answer (05/09/2024)  Transportation Needs: Patient Unable To Answer (05/09/2024)  Utilities: Patient Unable To Answer (05/09/2024)  Social Connections: Patient Unable To Answer (05/09/2024)  Recent Concern: Social Connections - Socially Isolated (03/05/2024)  Tobacco Use: Medium Risk (05/10/2024)   SDOH Interventions:     Readmission Risk Interventions    05/11/2024   11:01 AM 03/05/2024   12:39 PM 12/10/2023   11:57 AM  Readmission Risk Prevention Plan  Transportation Screening Complete Complete Complete  HRI or Home Care Consult  Complete Complete  Social Work Consult for Recovery Care Planning/Counseling  Complete Complete  Palliative Care Screening  Not Applicable Not Applicable  Medication Review Oceanographer) Complete Complete Complete  HRI or Home Care  Consult Complete    SW Recovery Care/Counseling Consult Complete    Palliative Care Screening Not Applicable    Skilled Nursing Facility Not Applicable

## 2024-05-11 NOTE — Patient Instructions (Signed)
 Medication Instructions:  Your physician recommends that you continue on your current medications as directed. Please refer to the Current Medication list given to you today.  *If you need a refill on your cardiac medications before your next appointment, please call your pharmacy*  Lab Work: Your physician recommends that you return for lab work in: 2 Weeks ( BMET, CBC)   If you have labs (blood work) drawn today and your tests are completely normal, you will receive your results only by: MyChart Message (if you have MyChart) OR A paper copy in the mail If you have any lab test that is abnormal or we need to change your treatment, we will call you to review the results.  Testing/Procedures: Your physician has requested that you have a lower or upper extremity arterial duplex. This test is an ultrasound of the arteries in the legs or arms. It looks at arterial blood flow in the legs and arms. Allow one hour for Lower and Upper Arterial scans. There are no restrictions or special instructions.  Please note: We ask at that you not bring children with you during ultrasound (echo/ vascular) testing. Due to room size and safety concerns, children are not allowed in the ultrasound rooms during exams. Our front office staff cannot provide observation of children in our lobby area while testing is being conducted. An adult accompanying a patient to their appointment will only be allowed in the ultrasound room at the discretion of the ultrasound technician under special circumstances. We apologize for any inconvenience.   Follow-Up: At [facility], you and your health needs are our priority.  As part of our continuing mission to provide you with exceptional heart care, our providers are all part of one team.  This team includes your primary Cardiologist (physician) and Advanced Practice Providers or APPs (Physician Assistants and Nurse Practitioners) who all work together to provide you with the  care you need, when you need it.  Your next appointment:   2 month(s)  Provider:   You may see Armida Lander, MD or one of the following Advanced Practice Providers on your designated Care Team:   Woodfin Hays, PA-C  Scotesia Brent, [place] Theotis Flake, [place]     We recommend signing up for the patient portal called "MyChart".  Sign up information is provided on this After Visit Summary.  MyChart is used to connect with patients for Virtual Visits (Telemedicine).  Patients are able to view lab/test results, encounter notes, upcoming appointments, etc.  Non-urgent messages can be sent to your provider as well.   To learn more about what you can do with MyChart, go to [email or web address].   Other Instructions Thank you for choosing [facility]!

## 2024-05-11 NOTE — Evaluation (Signed)
 Clinical/Bedside Swallow Evaluation Patient Details  Name: Christine Leblanc MRN: [id number] Date of Birth: 1929-04-06  Today's Date: 05/11/2024 Time: SLP Start Time (ACUTE ONLY): 0755 SLP Stop Time (ACUTE ONLY): 0822 SLP Time Calculation (min) (ACUTE ONLY): 27 min  Past Medical History:  Past Medical History:  Diagnosis Date   Anemia    Asthma    Baker's cyst    Cancer (HCC)    skin Ca- ? basal cell    Chronic back pain    Chronic neck pain    Degenerative joint disease    Left shoulder; cervical spine, knees & hands    Gastroesophageal reflux disease    Hiatal hernia; distal esophageal web requiring dilatation; gastric polyps; gastritis; refuses colonoscopy   GERD (gastroesophageal reflux disease)    History of stress test 1990's   stress test done under the care of Dr. Macdonald Leblanc & Dr. Katheryne Leblanc, now being followed by Dr. Amanda Leblanc- in [place] , recently seen & told to f/U in one yr.    Hyperlipidemia 04/27/2011   Hypertension    Hypothyroidism    Lymphocytic thyroiditis    Mild carotid artery disease (HCC)    Multiple thyroid  nodules 2010   Adenomatous; thyroidectomy in 2010   Orthostasis    Osteoarthritis of right knee 07/27/2014   Pneumonia    hosp. for pneumonia- long time ago    Primary localized osteoarthrosis of right shoulder 06/21/2015   Seizures (HCC)    yes- as a child- & into adult years, states she took med. for them at one time, stopped at 32 yrs. of age    Syncope and collapse 2007   Possible CVA in 2012 with left lower extremity weakness; refused hospitalization; CT-Atrophy and chronic microvascular ischemic change.    Past Surgical History:  Past Surgical History:  Procedure Laterality Date   ABDOMINAL HYSTERECTOMY     fibroids   CATARACT EXTRACTION     Bilateral; redo surgery on the right for incomplete primary procedure   COLONOSCOPY  Remote   ESOPHAGOGASTRODUODENOSCOPY  11/2009   Dr. Nolene Leblanc: probable distal web s/p dilation small hiatal hernia/gastric  polyps/mild gastritis, appeared to have narrowing at the junction of D1 and D2 dilated up to 12 mm   ESOPHAGOGASTRODUODENOSCOPY N/A 03/21/2015   Surgeon: Christine Jubilee, MD; proximal esophageal web s/p dilation, multiple gastric polyps s/p multiple biopsies, mild erosive gastritis s/p biopsy.  Pathology with fundic gland polyps, chronic gastritis, negative for H. pylori.   EYE SURGERY     PROLAPSED UTERINE FIBROID LIGATION     outcomed with rectocele & cystocele   SAVORY DILATION N/A 03/21/2015   Procedure: SAVORY DILATION;  Surgeon: Christine Jubilee, MD;  Location: [facility];  Service: Endoscopy;  Laterality: N/A;   SHOULDER SURGERY     left   TOTAL KNEE ARTHROPLASTY Right 07/27/2014   Procedure: RIGHT TOTAL KNEE ARTHROPLASTY;  Surgeon: Christine Barbone, MD;  Location: [facility] OR;  Service: Orthopedics;  Laterality: Right;   TOTAL SHOULDER ARTHROPLASTY Right 06/21/2015   Procedure: RIGHT TOTAL SHOULDER ARTHROPLASTY;  Surgeon: Christine Blase, MD;  Location: [facility] OR;  Service: Orthopedics;  Laterality: Right;   TOTAL THYROIDECTOMY  2010   HPI:  Christine Leblanc is a 89 y.o. female with medical history significant of history of asthma/COPD, chronic kidney disease stage IIIb, hypothyroidism, GERD, hypertension, hyperlipidemia, history of TIA in the past and degenerative joint disease; who presented to the hospital secondary to left-sided weakness and dysarthria.  Patient was last seen normal  around 10:30 PM by her son on 05/08/2024. MRI reveals "No evidence for acute or subacute ischemia."   MBSS 07/24/2023: "Clinical Impression: Pt presents with stable severe oropharyngeal dysphagia. Biomechanical parameters and dysphagic outcomes appear largely unchanged compared to last evaluation. Today Pt presents with decreased coordination of the swallow, little/no epiglottic deflection, diminished pharyngeal stripping wave and decreased laryngeal vestibule closure which results in trace to moderate amounts of silent  aspiration of all liquid consistencies. Moderate to majority of contrast remains in the pharyngeal space (significant valleculae residue) after the initial swallow, worsening with thicker/heavier consistencies. Head turn to the left with a slight chin tuck with swallows does seem to facilitate pharyngeal clearance in conjunction with liquid wash to clear residue.  Pt with wet vocal quality and intermittent throat clears and some coughing throughout, but Pt was never able to remove aspirates from trachea. In contrasting liquid consistencies, visualized more aspiration of thin liquids with the initial swallow but increased pharyngeal residue of NTL and HTL. Residue mixes with secretions and is evenutally aspirated; With noting these impairments there still does not appear to be an appreciable difference in thin versus thickened liquids. The barium tablet became lodged in the valleculae and was not cleared which was found to be similar in comparing report/imaging from recent MBSS (09/25/23). Pt consumes protien shakes and D2/puree diet at home and is reportedly maintaining her weight; Alternative means of nutrition has been recommended and declined many times. Pt appears to have been managing her severe dysphagia for several years without getting PNA.  Recommend continue with puree/D2 diet and thin liquids and crush medications as able and take in puree/yogurt and follow with liquid wash. Despite Pt's oropharyngeal dysphagia being chronic and unchanged, Recommend HH ST to provide therapy to reinforce strategies and an exercise program (per request of Pt and Pt's son). Reviewed study and recommendations at length with Pt and her son."   Assessment / Plan / Recommendation  Clinical Impression  A BSE was completed to assess the pt's oropharyngeal functioning. No hx of PNA in the past year. Baseline diet is puree/D2 with thin. Pt denied globus sensation or odynophagia. When provided set up, pt consumed ice chips via  spoon, thin liquid water  and soda via straw and cup sip, and pureed grits and applesauce via spoon. Pt declined trials of mech soft and regular stating she does not consume that at baseline. Cough at baseline in absense of PO and delayed cough noted after thin liquid soda via straw. Multiple swallows per palpation with grits. Wet vocal quality after the swallow of applesauce, cleared with cued throat clear and re-swallow. Pt presents with oropharyngeal dysphagia (hx of chronic severe dysphagia baseline). Deficits characterized by reduced labial seal leading to anterior bolus loss with thin, reduced mastication d/t edentulous nature, suspected increased bolus transit time, and suspected pharyngeal residue). Most recent MBSS detailed above. Recommend continue D1/thin at this time with meds crushed with puree. SLP Visit Diagnosis: Dysphagia, oropharyngeal phase (R13.12)    Aspiration Risk  Moderate aspiration risk    Diet Recommendation Dysphagia 1 (Puree);Thin liquid    Liquid Administration via: Straw;Cup Medication Administration: Crushed with puree Supervision: Patient able to self feed Compensations: Minimize environmental distractions;Slow rate;Small sips/bites;Follow solids with liquid;Clear throat intermittently Postural Changes: Seated upright at 90 degrees;Remain upright for at least 30 minutes after po intake    Other  Recommendations Oral Care Recommendations: Oral care BID    Recommendations for follow up therapy are one component of a multi-disciplinary discharge  planning process, led by the attending physician.  Recommendations may be updated based on patient status, additional functional criteria and insurance authorization.  Follow up Recommendations No SLP follow up         Functional Status Assessment Patient has had a recent decline in their functional status and/or demonstrates limited ability to make significant improvements in function in a reasonable and predictable amount  of time  Frequency and Duration   No ST indicated at this time. Recommended pt/family monitor for new dysphagia symptoms and/or s/sx of PNA.         Prognosis Prognosis for improved oropharyngeal function: Guarded Barriers to Reach Goals: Severity of deficits;Other (Comment) (Chronic dysphagia)      Swallow Study   General HPI: YISELL HYERS is a 89 y.o. female with medical history significant of history of asthma/COPD, chronic kidney disease stage IIIb, hypothyroidism, GERD, hypertension, hyperlipidemia, history of TIA in the past and degenerative joint disease; who presented to the hospital secondary to left-sided weakness and dysarthria.  Patient was last seen normal around 10:30 PM by her son on 05/08/2024. MRI reveals "No evidence for acute or subacute ischemia." Type of Study: Bedside Swallow Evaluation Previous Swallow Assessment: MBSS 07/2023: severe oropharyngeal dysphagia that is chronic in nature. Recommendation at that time was to continue with puree/D2 diet and thin liquids, crush medications as able and take in puree/yogurt and follow with liquid wash. Diet Prior to this Study: Dysphagia 1 (pureed);Thin liquids (Level 0) Temperature Spikes Noted: No Respiratory Status: Room air History of Recent Intubation: No Behavior/Cognition: Alert;Cooperative;Confused;Pleasant mood Oral Cavity Assessment: Within Functional Limits Oral Care Completed by SLP: No Oral Cavity - Dentition: Edentulous Vision: Functional for self-feeding Self-Feeding Abilities: Able to feed self;Needs set up Patient Positioning: Upright in bed Baseline Vocal Quality: Breathy Volitional Cough: Weak Volitional Swallow: Able to elicit    Oral/Motor/Sensory Function Overall Oral Motor/Sensory Function: Within functional limits   Ice Chips Ice chips: Within functional limits Presentation: Spoon   Thin Liquid Thin Liquid: Impaired Presentation: Straw;Cup Oral Phase Impairments: Reduced labial seal Oral Phase  Functional Implications: Prolonged oral transit Pharyngeal  Phase Impairments: Multiple swallows;Wet Vocal Quality;Cough - Delayed    Nectar Thick Nectar Thick Liquid: Not tested   Honey Thick Honey Thick Liquid: Not tested   Puree Puree: Within functional limits Presentation: Self Fed;Spoon   Solid   Caretha Chapel, MA CCC-SLP 05/11/2024 9:10 AM   Solid: Not tested (Pt declined regular and mech soft solid trials stating it is not something she would eat at baseline.)      Caretha Chapel 05/11/2024,9:06 AM

## 2024-05-11 NOTE — Progress Notes (Signed)
 Cardiology Office Note:  .   Date:  05/11/2024  ID:  Altamease Asters, DOB [birth year], MRN [id number] PCP: Omie Bickers, MD  [facility] Providers Cardiologist:  Armida Lander, MD {  History of Present Illness: .   Christine Leblanc is a 89 y.o. female with a h/o DVT in L profunda femoral vein, CKD stage 3, GERD, hiatal hernia, near syncope, HTN, HLD, TIA, and recent stroke-like symptoms.   Evaluated in 2021 for near syncope with echo showing normal LVEF, trivial MR and monitor with no significant arrhythmia. Episode felt to be from dehydration. She was diagnosed with a DVT in 10/2022 of left profunda femoral vein with follow-up DVT study with no DVT and Baker's cyst.   The patient has had multiple admission for different issues (PNA, AMS, TIA, encephalopathy in the setting of Influenza A)  The patient was last seen 04/27/24 for cough, leg pain and elevated BP.   The patient was admitted for stroke. She presented with weakness and dysarthria. CT head showed no acute abnormalities. MRI showed no acute ischemic stroke.echo showed LVEF 70-75%, G1DD, no WMA. Started on ASA and plavix  for 21 days, then only ASA thereafter.   Today, the patient reports cough and mucus. Also reports minimal SOB. No chest pain. Reports lower leg edema on the right side, feels its up the entire leg. On exam, does not appear volume up on exam.  She reports prior history of right knee replacement.  Says edema is at the end of the day. She does not take a diuretic. BP today is elevated.    Studies Reviewed: .        Echo 05/10/24 1. Left ventricular ejection fraction, by estimation, is 70 to 75%. The  left ventricle has hyperdynamic function. The left ventricle has no  regional wall motion abnormalities. Left ventricular diastolic parameters  are consistent with Grade I diastolic  dysfunction (impaired relaxation).   2. Right ventricular systolic function is hyperdynamic. The right  ventricular size is  normal.   3. Left atrial size was mildly dilated.   4. The mitral valve is grossly normal. Trivial mitral valve  regurgitation. No evidence of mitral stenosis.   5. The aortic valve was not well visualized. Aortic valve regurgitation  is not visualized. No aortic stenosis is present.   Comparison(s): Prior images reviewed side by side. Function is more  vigorous from prior.   Carotid US  05/10/24  IMPRESSION: Stable appearance of bilateral carotid bulb and proximal ICA plaque, right greater than left. Estimated bilateral ICA stenoses are less than 50%.  Echo 01/2024   1. Left ventricular ejection fraction, by estimation, is 65 to 70%. The  left ventricle has normal function. The left ventricle has no regional  wall motion abnormalities. Left ventricular diastolic parameters are  consistent with Grade I diastolic  dysfunction (impaired relaxation).   2. Right ventricular systolic function is normal. The right ventricular  size is normal.   3. The mitral valve is abnormal. Mild mitral valve regurgitation. No  evidence of mitral stenosis.   4. The aortic valve is tricuspid. There is mild calcification of the  aortic valve. There is mild thickening of the aortic valve. Aortic valve  regurgitation is not visualized. No aortic stenosis is present.        Physical Exam:   VS:  BP (!) 150/70 (BP Location: Right Arm, Cuff Size: Normal)   Pulse 88   SpO2 96%    Wt Readings from Last  3 Encounters:  05/09/24 116 lb 13.5 oz (53 kg)  04/27/24 102 lb (46.3 kg)  04/13/24 102 lb 3.2 oz (46.4 kg)    GEN: Well nourished, well developed in no acute distress NECK: No JVD; No carotid bruits CARDIAC: RRR, no murmurs, rubs, gallops RESPIRATORY:  Clear to auscultation without rales, wheezing or rhonchi  ABDOMEN: Soft, non-tender, non-distended EXTREMITIES:  No edema; No deformity   ASSESSMENT AND PLAN: .    HTN Blood pressure elevated today, however patient had a recent admission for stroke.   Will allow for permissive hypertension.  Blood pressure medication changed to Coreg 12.5 mg twice daily clonidine  0.1 mg daily.  Blood pressure is elevated at follow-up can increase Coreg.  Lower leg edema Leg pain Patient's son reports worsening lower leg edema over the last week.  On exam, she appears euvolemic.  Son says this is because she has been laying in the bed for the last few days.  Patient also reports chronic right leg pain, prior ABIs ordered, we will schedule this.  Patient is not on a diuretic at baseline, would not start due to dehydration/AKI on labs.  They are requesting DVT study, although low suspicion at this point.  I will order a right DVT study.  AKI Kidney function Scr 1.14>2.61>2.71/BUN 49.  Patient is not on nephrotoxic agents at baseline.  Recommended good hydration.  Recheck BMET in 2 weeks  Leukocytosis Patient with home on antibiotic chest x-ray also showed possible pneumonia.  Patient reports cough and shortness of breath.  Would recommend follow-up with PCP.  Recheck CBC in 2 weeks.  TIA Recent admission for TIA/stroke-like symptoms. Imaging was unrevealing. She was sent home on ASA + Plavix  for 1 month, followed by ASA thereafter. Continue Pravastatin .    Dispo: Follow-up in 2 months  Signed, Leslie Langille Rebekah Canada, PA-C

## 2024-05-12 DIAGNOSIS — I959 Hypotension, unspecified: Secondary | ICD-10-CM | POA: Diagnosis not present

## 2024-05-12 DIAGNOSIS — R531 Weakness: Secondary | ICD-10-CM | POA: Diagnosis not present

## 2024-05-12 DIAGNOSIS — K5900 Constipation, unspecified: Secondary | ICD-10-CM | POA: Diagnosis not present

## 2024-05-12 NOTE — Transitions of Care (Post Inpatient/ED Visit) (Signed)
 05/12/2024  Patient ID: Christine Leblanc, female   DOB: 06-22-1929, 89 y.o.   MRN: [id number]  Chart Review for transitions of care.  See [employer] for documentation.  Marcianna Daily J. Safia Panzer RN, MSN [facility], [facility] RN Care Manager Direct Dial: [phone]  Fax: [phone] Website: [email or web address]

## 2024-05-13 ENCOUNTER — Emergency Department (HOSPITAL_COMMUNITY)

## 2024-05-13 ENCOUNTER — Other Ambulatory Visit: Payer: Self-pay

## 2024-05-13 ENCOUNTER — Inpatient Hospital Stay (HOSPITAL_COMMUNITY)
Admission: EM | Admit: 2024-05-13 | Discharge: 2024-05-19 | DRG: 659 | Disposition: A | Discharge status: Hospice | Attending: Internal Medicine | Admitting: Internal Medicine

## 2024-05-13 ENCOUNTER — Encounter (HOSPITAL_COMMUNITY): Payer: Self-pay | Admitting: Student

## 2024-05-13 ENCOUNTER — Inpatient Hospital Stay (HOSPITAL_COMMUNITY)

## 2024-05-13 DIAGNOSIS — Z6822 Body mass index (BMI) 22.0-22.9, adult: Secondary | ICD-10-CM

## 2024-05-13 DIAGNOSIS — E785 Hyperlipidemia, unspecified: Secondary | ICD-10-CM | POA: Diagnosis present

## 2024-05-13 DIAGNOSIS — Z96611 Presence of right artificial shoulder joint: Secondary | ICD-10-CM | POA: Diagnosis present

## 2024-05-13 DIAGNOSIS — K7689 Other specified diseases of liver: Secondary | ICD-10-CM | POA: Diagnosis not present

## 2024-05-13 DIAGNOSIS — R54 Age-related physical debility: Secondary | ICD-10-CM | POA: Diagnosis present

## 2024-05-13 DIAGNOSIS — Z9842 Cataract extraction status, left eye: Secondary | ICD-10-CM

## 2024-05-13 DIAGNOSIS — N2889 Other specified disorders of kidney and ureter: Secondary | ICD-10-CM | POA: Diagnosis not present

## 2024-05-13 DIAGNOSIS — N1330 Unspecified hydronephrosis: Secondary | ICD-10-CM

## 2024-05-13 DIAGNOSIS — Z79899 Other long term (current) drug therapy: Secondary | ICD-10-CM

## 2024-05-13 DIAGNOSIS — Z515 Encounter for palliative care: Secondary | ICD-10-CM | POA: Diagnosis not present

## 2024-05-13 DIAGNOSIS — E871 Hypo-osmolality and hyponatremia: Secondary | ICD-10-CM | POA: Diagnosis present

## 2024-05-13 DIAGNOSIS — J4489 Other specified chronic obstructive pulmonary disease: Secondary | ICD-10-CM | POA: Diagnosis not present

## 2024-05-13 DIAGNOSIS — Z8249 Family history of ischemic heart disease and other diseases of the circulatory system: Secondary | ICD-10-CM

## 2024-05-13 DIAGNOSIS — N1832 Chronic kidney disease, stage 3b: Secondary | ICD-10-CM | POA: Diagnosis not present

## 2024-05-13 DIAGNOSIS — Z96612 Presence of left artificial shoulder joint: Secondary | ICD-10-CM | POA: Diagnosis not present

## 2024-05-13 DIAGNOSIS — R918 Other nonspecific abnormal finding of lung field: Secondary | ICD-10-CM | POA: Diagnosis not present

## 2024-05-13 DIAGNOSIS — E43 Unspecified severe protein-calorie malnutrition: Secondary | ICD-10-CM | POA: Diagnosis present

## 2024-05-13 DIAGNOSIS — E890 Postprocedural hypothyroidism: Secondary | ICD-10-CM | POA: Diagnosis not present

## 2024-05-13 DIAGNOSIS — R55 Syncope and collapse: Secondary | ICD-10-CM | POA: Diagnosis present

## 2024-05-13 DIAGNOSIS — N3001 Acute cystitis with hematuria: Secondary | ICD-10-CM | POA: Diagnosis not present

## 2024-05-13 DIAGNOSIS — D649 Anemia, unspecified: Secondary | ICD-10-CM | POA: Diagnosis not present

## 2024-05-13 DIAGNOSIS — Z7189 Other specified counseling: Secondary | ICD-10-CM | POA: Diagnosis not present

## 2024-05-13 DIAGNOSIS — E860 Dehydration: Secondary | ICD-10-CM | POA: Diagnosis not present

## 2024-05-13 DIAGNOSIS — N838 Other noninflammatory disorders of ovary, fallopian tube and broad ligament: Secondary | ICD-10-CM | POA: Diagnosis present

## 2024-05-13 DIAGNOSIS — Z466 Encounter for fitting and adjustment of urinary device: Secondary | ICD-10-CM | POA: Diagnosis not present

## 2024-05-13 DIAGNOSIS — R4182 Altered mental status, unspecified: Secondary | ICD-10-CM | POA: Diagnosis not present

## 2024-05-13 DIAGNOSIS — N9489 Other specified conditions associated with female genital organs and menstrual cycle: Secondary | ICD-10-CM

## 2024-05-13 DIAGNOSIS — J90 Pleural effusion, not elsewhere classified: Secondary | ICD-10-CM | POA: Diagnosis not present

## 2024-05-13 DIAGNOSIS — R7881 Bacteremia: Secondary | ICD-10-CM | POA: Diagnosis not present

## 2024-05-13 DIAGNOSIS — Z7982 Long term (current) use of aspirin: Secondary | ICD-10-CM

## 2024-05-13 DIAGNOSIS — I21A1 Myocardial infarction type 2: Secondary | ICD-10-CM | POA: Diagnosis present

## 2024-05-13 DIAGNOSIS — I959 Hypotension, unspecified: Secondary | ICD-10-CM | POA: Diagnosis not present

## 2024-05-13 DIAGNOSIS — M7989 Other specified soft tissue disorders: Secondary | ICD-10-CM | POA: Diagnosis not present

## 2024-05-13 DIAGNOSIS — R1310 Dysphagia, unspecified: Secondary | ICD-10-CM | POA: Diagnosis not present

## 2024-05-13 DIAGNOSIS — K219 Gastro-esophageal reflux disease without esophagitis: Secondary | ICD-10-CM | POA: Diagnosis not present

## 2024-05-13 DIAGNOSIS — N179 Acute kidney failure, unspecified: Secondary | ICD-10-CM | POA: Diagnosis not present

## 2024-05-13 DIAGNOSIS — R4020 Unspecified coma: Secondary | ICD-10-CM | POA: Diagnosis not present

## 2024-05-13 DIAGNOSIS — B9620 Unspecified Escherichia coli [E. coli] as the cause of diseases classified elsewhere: Secondary | ICD-10-CM | POA: Diagnosis present

## 2024-05-13 DIAGNOSIS — B9689 Other specified bacterial agents as the cause of diseases classified elsewhere: Secondary | ICD-10-CM | POA: Diagnosis not present

## 2024-05-13 DIAGNOSIS — Z9841 Cataract extraction status, right eye: Secondary | ICD-10-CM

## 2024-05-13 DIAGNOSIS — F419 Anxiety disorder, unspecified: Secondary | ICD-10-CM | POA: Diagnosis present

## 2024-05-13 DIAGNOSIS — D696 Thrombocytopenia, unspecified: Secondary | ICD-10-CM | POA: Diagnosis present

## 2024-05-13 DIAGNOSIS — Z66 Do not resuscitate: Secondary | ICD-10-CM | POA: Diagnosis not present

## 2024-05-13 DIAGNOSIS — I6523 Occlusion and stenosis of bilateral carotid arteries: Secondary | ICD-10-CM | POA: Diagnosis not present

## 2024-05-13 DIAGNOSIS — Z85828 Personal history of other malignant neoplasm of skin: Secondary | ICD-10-CM

## 2024-05-13 DIAGNOSIS — Z888 Allergy status to other drugs, medicaments and biological substances status: Secondary | ICD-10-CM

## 2024-05-13 DIAGNOSIS — N136 Pyonephrosis: Secondary | ICD-10-CM | POA: Diagnosis present

## 2024-05-13 DIAGNOSIS — Z833 Family history of diabetes mellitus: Secondary | ICD-10-CM

## 2024-05-13 DIAGNOSIS — N905 Atrophy of vulva: Secondary | ICD-10-CM | POA: Diagnosis not present

## 2024-05-13 DIAGNOSIS — Z87891 Personal history of nicotine dependence: Secondary | ICD-10-CM | POA: Diagnosis not present

## 2024-05-13 DIAGNOSIS — Z90710 Acquired absence of both cervix and uterus: Secondary | ICD-10-CM

## 2024-05-13 DIAGNOSIS — I6782 Cerebral ischemia: Secondary | ICD-10-CM | POA: Diagnosis not present

## 2024-05-13 DIAGNOSIS — Z79890 Hormone replacement therapy (postmenopausal): Secondary | ICD-10-CM

## 2024-05-13 DIAGNOSIS — N132 Hydronephrosis with renal and ureteral calculous obstruction: Secondary | ICD-10-CM | POA: Diagnosis not present

## 2024-05-13 DIAGNOSIS — R404 Transient alteration of awareness: Secondary | ICD-10-CM | POA: Diagnosis not present

## 2024-05-13 DIAGNOSIS — I129 Hypertensive chronic kidney disease with stage 1 through stage 4 chronic kidney disease, or unspecified chronic kidney disease: Secondary | ICD-10-CM | POA: Diagnosis not present

## 2024-05-13 DIAGNOSIS — R9082 White matter disease, unspecified: Secondary | ICD-10-CM | POA: Diagnosis not present

## 2024-05-13 DIAGNOSIS — J449 Chronic obstructive pulmonary disease, unspecified: Secondary | ICD-10-CM | POA: Diagnosis not present

## 2024-05-13 DIAGNOSIS — N134 Hydroureter: Secondary | ICD-10-CM | POA: Diagnosis not present

## 2024-05-13 DIAGNOSIS — R0602 Shortness of breath: Secondary | ICD-10-CM | POA: Diagnosis not present

## 2024-05-13 DIAGNOSIS — Z7902 Long term (current) use of antithrombotics/antiplatelets: Secondary | ICD-10-CM

## 2024-05-13 DIAGNOSIS — R7989 Other specified abnormal findings of blood chemistry: Secondary | ICD-10-CM

## 2024-05-13 DIAGNOSIS — Z96651 Presence of right artificial knee joint: Secondary | ICD-10-CM | POA: Diagnosis present

## 2024-05-13 DIAGNOSIS — I700 Atherosclerosis of aorta: Secondary | ICD-10-CM | POA: Diagnosis not present

## 2024-05-13 DIAGNOSIS — Z8673 Personal history of transient ischemic attack (TIA), and cerebral infarction without residual deficits: Secondary | ICD-10-CM

## 2024-05-13 DIAGNOSIS — N2882 Megaloureter: Secondary | ICD-10-CM | POA: Diagnosis not present

## 2024-05-13 DIAGNOSIS — I10 Essential (primary) hypertension: Secondary | ICD-10-CM | POA: Diagnosis not present

## 2024-05-13 DIAGNOSIS — G8929 Other chronic pain: Secondary | ICD-10-CM | POA: Diagnosis present

## 2024-05-13 LAB — I-STAT VENOUS BLOOD GAS, ED
Acid-base deficit: 4 mmol/L — ABNORMAL HIGH (ref 0.0–2.0)
Bicarbonate: 20.6 mmol/L (ref 20.0–28.0)
Calcium, Ion: 1.12 mmol/L — ABNORMAL LOW (ref 1.15–1.40)
HCT: 27 % — ABNORMAL LOW (ref 36.0–46.0)
Hemoglobin: 9.2 g/dL — ABNORMAL LOW (ref 12.0–15.0)
O2 Saturation: 82 %
Potassium: 3.8 mmol/L (ref 3.5–5.1)
Sodium: 134 mmol/L — ABNORMAL LOW (ref 135–145)
TCO2: 22 mmol/L (ref 22–32)
pCO2, Ven: 33.4 mmHg — ABNORMAL LOW (ref 44–60)
pH, Ven: 7.398 (ref 7.25–7.43)
pO2, Ven: 46 mmHg — ABNORMAL HIGH (ref 32–45)

## 2024-05-13 LAB — URINALYSIS, W/ REFLEX TO CULTURE (INFECTION SUSPECTED)
Bilirubin Urine: NEGATIVE
Glucose, UA: NEGATIVE mg/dL
Ketones, ur: 5 mg/dL — AB
Nitrite: POSITIVE — AB
Protein, ur: 100 mg/dL — AB
Specific Gravity, Urine: 1.016 (ref 1.005–1.030)
WBC, UA: >50 WBC/hpf (ref 0–5)
pH: 5 (ref 5.0–8.0)

## 2024-05-13 LAB — CBC WITH DIFFERENTIAL/PLATELET
Abs Immature Granulocytes: 0.12 10*3/uL — ABNORMAL HIGH (ref 0.00–0.07)
Basophils Absolute: 0 10*3/uL (ref 0.0–0.1)
Basophils Relative: 0 %
Eosinophils Absolute: 0.1 10*3/uL (ref 0.0–0.5)
Eosinophils Relative: 1 %
HCT: 28.5 % — ABNORMAL LOW (ref 36.0–46.0)
Hemoglobin: 9.3 g/dL — ABNORMAL LOW (ref 12.0–15.0)
Immature Granulocytes: 1 %
Lymphocytes Relative: 4 %
Lymphs Abs: 0.5 10*3/uL — ABNORMAL LOW (ref 0.7–4.0)
MCH: 29.2 pg (ref 26.0–34.0)
MCHC: 32.6 g/dL (ref 30.0–36.0)
MCV: 89.6 fL (ref 80.0–100.0)
Monocytes Absolute: 1.5 10*3/uL — ABNORMAL HIGH (ref 0.1–1.0)
Monocytes Relative: 11 %
Neutro Abs: 11.4 10*3/uL — ABNORMAL HIGH (ref 1.7–7.7)
Neutrophils Relative %: 83 %
Platelets: 85 10*3/uL — ABNORMAL LOW (ref 150–400)
RBC: 3.18 MIL/uL — ABNORMAL LOW (ref 3.87–5.11)
RDW: 16.7 % — ABNORMAL HIGH (ref 11.5–15.5)
WBC: 13.7 10*3/uL — ABNORMAL HIGH (ref 4.0–10.5)
nRBC: 0 % (ref 0.0–0.2)

## 2024-05-13 LAB — TROPONIN I (HIGH SENSITIVITY)
Troponin I (High Sensitivity): 65 ng/L — ABNORMAL HIGH (ref <18)
Troponin I (High Sensitivity): 32 ng/L — ABNORMAL HIGH (ref <18)

## 2024-05-13 LAB — COMPREHENSIVE METABOLIC PANEL WITH GFR
ALT: 17 U/L (ref 0–44)
AST: 29 U/L (ref 15–41)
Albumin: 2 g/dL — ABNORMAL LOW (ref 3.5–5.0)
Alkaline Phosphatase: 98 U/L (ref 38–126)
Anion gap: 12 (ref 5–15)
BUN: 56 mg/dL — ABNORMAL HIGH (ref 8–23)
CO2: 20 mmol/L — ABNORMAL LOW (ref 22–32)
Calcium: 8.1 mg/dL — ABNORMAL LOW (ref 8.9–10.3)
Chloride: 101 mmol/L (ref 98–111)
Creatinine, Ser: 3.26 mg/dL — ABNORMAL HIGH (ref 0.44–1.00)
GFR, Estimated: 13 mL/min — ABNORMAL LOW (ref >60)
Glucose, Bld: 107 mg/dL — ABNORMAL HIGH (ref 70–99)
Potassium: 3.9 mmol/L (ref 3.5–5.1)
Sodium: 133 mmol/L — ABNORMAL LOW (ref 135–145)
Total Bilirubin: 0.6 mg/dL (ref 0.0–1.2)
Total Protein: 5.4 g/dL — ABNORMAL LOW (ref 6.5–8.1)

## 2024-05-13 LAB — POC OCCULT BLOOD, ED: Fecal Occult Bld: NEGATIVE

## 2024-05-13 LAB — IRON AND TIBC
Iron: 8 ug/dL — ABNORMAL LOW (ref 28–170)
Saturation Ratios: 6 % — ABNORMAL LOW (ref 10.4–31.8)
TIBC: 141 ug/dL — ABNORMAL LOW (ref 250–450)
UIBC: 133 ug/dL

## 2024-05-13 LAB — CBG MONITORING, ED: Glucose-Capillary: 117 mg/dL — ABNORMAL HIGH (ref 70–99)

## 2024-05-13 LAB — AMMONIA: Ammonia: 17 umol/L (ref 9–35)

## 2024-05-13 LAB — RAPID URINE DRUG SCREEN, HOSP PERFORMED
Amphetamines: NOT DETECTED
Barbiturates: NOT DETECTED
Benzodiazepines: POSITIVE — AB
Cocaine: NOT DETECTED
Opiates: NOT DETECTED
Tetrahydrocannabinol: NOT DETECTED

## 2024-05-13 LAB — I-STAT CHEM 8, ED
BUN: 49 mg/dL — ABNORMAL HIGH (ref 8–23)
Calcium, Ion: 1.11 mmol/L — ABNORMAL LOW (ref 1.15–1.40)
Chloride: 102 mmol/L (ref 98–111)
Creatinine, Ser: 3.2 mg/dL — ABNORMAL HIGH (ref 0.44–1.00)
Glucose, Bld: 109 mg/dL — ABNORMAL HIGH (ref 70–99)
HCT: 29 % — ABNORMAL LOW (ref 36.0–46.0)
Hemoglobin: 9.9 g/dL — ABNORMAL LOW (ref 12.0–15.0)
Potassium: 3.8 mmol/L (ref 3.5–5.1)
Sodium: 134 mmol/L — ABNORMAL LOW (ref 135–145)
TCO2: 20 mmol/L — ABNORMAL LOW (ref 22–32)

## 2024-05-13 LAB — TSH: TSH: 2.923 u[IU]/mL (ref 0.350–4.500)

## 2024-05-13 LAB — FERRITIN: Ferritin: 274 ng/mL (ref 11–307)

## 2024-05-13 LAB — D-DIMER, QUANTITATIVE: D-Dimer, Quant: 7.11 ug{FEU}/mL — ABNORMAL HIGH (ref 0.00–0.50)

## 2024-05-13 LAB — ETHANOL: Alcohol, Ethyl (B): <15 mg/dL (ref <15)

## 2024-05-13 LAB — I-STAT CG4 LACTIC ACID, ED: Lactic Acid, Venous: 1.5 mmol/L (ref 0.5–1.9)

## 2024-05-13 MED ORDER — PRAVASTATIN SODIUM 40 MG PO TABS
40.0000 mg | ORAL_TABLET | Freq: Every day | ORAL | Status: DC
  Administered 2024-05-13 – 2024-05-18 (×6): 40 mg via ORAL
  Filled 2024-05-13 (×6): qty 1

## 2024-05-13 MED ORDER — PANTOPRAZOLE SODIUM 40 MG PO TBEC
40.0000 mg | DELAYED_RELEASE_TABLET | Freq: Two times a day (BID) | ORAL | Status: DC
Start: 2024-05-13 — End: 2024-05-19
  Administered 2024-05-13 – 2024-05-18 (×9): 40 mg via ORAL
  Filled 2024-05-13 (×9): qty 1

## 2024-05-13 MED ORDER — LACTATED RINGERS IV BOLUS
500.0000 mL | Freq: Once | INTRAVENOUS | Status: AC
  Administered 2024-05-13: 500 mL via INTRAVENOUS

## 2024-05-13 MED ORDER — ACETAMINOPHEN 325 MG PO TABS
650.0000 mg | ORAL_TABLET | Freq: Four times a day (QID) | ORAL | Status: DC | PRN
  Administered 2024-05-13 – 2024-05-17 (×2): 650 mg via ORAL
  Filled 2024-05-13 (×3): qty 2

## 2024-05-13 MED ORDER — ASPIRIN 81 MG PO TBEC
81.0000 mg | DELAYED_RELEASE_TABLET | Freq: Every day | ORAL | Status: DC
Start: 2024-05-13 — End: 2024-05-15
  Administered 2024-05-13 – 2024-05-14 (×2): 81 mg via ORAL
  Filled 2024-05-13 (×2): qty 1

## 2024-05-13 MED ORDER — LACTATED RINGERS IV SOLN: INTRAVENOUS | Status: DC

## 2024-05-13 MED ORDER — SODIUM CHLORIDE 0.9 % IV SOLN
1.0000 g | Freq: Once | INTRAVENOUS | Status: AC
  Administered 2024-05-13: 1 g via INTRAVENOUS
  Filled 2024-05-13: qty 10

## 2024-05-13 MED ORDER — LEVOTHYROXINE SODIUM 88 MCG PO TABS
88.0000 ug | ORAL_TABLET | ORAL | Status: DC
  Administered 2024-05-14 – 2024-05-19 (×5): 88 ug via ORAL
  Filled 2024-05-13 (×6): qty 1

## 2024-05-13 MED ORDER — ACETAMINOPHEN 650 MG RE SUPP: 650.0000 mg | Freq: Four times a day (QID) | RECTAL | Status: DC | PRN

## 2024-05-13 MED ORDER — LEVOTHYROXINE SODIUM 88 MCG PO TABS
44.0000 ug | ORAL_TABLET | ORAL | Status: DC
  Administered 2024-05-17: 44 ug via ORAL
  Filled 2024-05-13: qty 1

## 2024-05-13 MED ORDER — ALBUTEROL SULFATE HFA 108 (90 BASE) MCG/ACT IN AERS: 1.0000 | INHALATION_SPRAY | RESPIRATORY_TRACT | Status: DC | PRN

## 2024-05-13 MED ORDER — SODIUM CHLORIDE 0.9 % IV SOLN: 1.0000 g | INTRAVENOUS | Status: DC

## 2024-05-13 MED ORDER — LACTATED RINGERS IV SOLN: INTRAVENOUS | Status: AC

## 2024-05-13 MED ORDER — LEVOTHYROXINE SODIUM 88 MCG PO TABS: 88.0000 ug | ORAL_TABLET | ORAL | Status: DC

## 2024-05-13 MED ORDER — HEPARIN SODIUM (PORCINE) 5000 UNIT/ML IJ SOLN: 5000.0000 [IU] | Freq: Three times a day (TID) | INTRAMUSCULAR | Status: DC

## 2024-05-13 MED ORDER — CLOPIDOGREL BISULFATE 75 MG PO TABS
75.0000 mg | ORAL_TABLET | Freq: Every day | ORAL | Status: DC
  Administered 2024-05-13 – 2024-05-14 (×2): 75 mg via ORAL
  Filled 2024-05-13 (×2): qty 1

## 2024-05-13 NOTE — Transitions of Care (Post Inpatient/ED Visit) (Signed)
 05/13/2024  Patient ID: Christine Leblanc, female   DOB: October 23, 1929, 89 y.o.   MRN: [id number]  Chart Review for transitions of care.  See [employer] for documentation.  Corabelle Spackman J. Charistopher Rumble RN, MSN [facility], [facility] RN Care Manager Direct Dial: [phone]  Fax: [phone] Website: [email or web address]

## 2024-05-13 NOTE — H&P (Addendum)
 History and Physical    Patient: Christine Leblanc:[id number] DOB: Dec 23, 1929 DOA: 05/13/2024 DOS: the patient was seen and examined on 05/13/2024 PCP: Omie Bickers, MD  Patient coming from: Home  Chief Complaint:  Chief Complaint  Patient presents with   Altered Mental Status    Pt bib ems from home. Pt's son woke her up this morning, like normal, set pt on toilet where she went unresponsive and called 911. PMH: TIA, UTI   HPI: Christine Leblanc is a 89 y.o. female with medical history significant of COPD, dyslipidemia, HTN, hx of TIA and CVA, iron deficiency anemia, anxiety, hypothyroidism, CKD 3B, DNR presenting with AMS.  Recently admitted at [facility] for stroke like symptoms (left sided weakness and dysarthria). MRI negative but exam limited. She was ultimately discharged with DAPT. Also found to have UTI, discharged with oral abx.   Patient's son and daughter at bedside who help provide history.  Per family, patient was recently discharged from [facility] about 2 days ago.  She has since been having decreased oral intake, has barely had anything to eat or drink since that time.  Patient slept well overnight and awakened this morning without any issues.  Son helped patient ambulate to the bathroom.  Patient urinated in toilet.  Son notes that he subsequently stood the patient up to help put on her underwear and as he was seating her back on the toilet, she suddenly lost consciousness.  Son reports that she was unresponsive during this time and thus he called EMS.  EMS arrived to find that patient was altered with GCS 13.  Airway, breathing, circulation was intact.  Patient subsequently brought to the ED for further evaluation.  Son states that patient did not complain of any chest pain, lightheadedness, dizziness, palpitations prior to her syncopal episode.  On my exam, patient is alert and oriented to person, place, but not to time.  She is able to recognize her son and daughter at bedside.  Patient  reports that she does not recall any of the events that occurred earlier today.  She currently denies any chest pain, palpitations, lightheadedness, dizziness, nausea, vomiting, fevers, chills, abdominal pain.  She denies any burning with urination or urinary frequency.  ED course: Vital signs stable.  CBC with mild leukocytosis, anemia around her baseline, thrombocytopenia slightly worse than baseline. TSH normal. VBG without acidosis, does show mild hypocapnia. Lactic acid normal. D-dimer 7.11. Ammonia normal. FOBT negative. UDS positive for benzodiazepines (patient on valium  at home) Troponins 65 > 32. Urinalysis with hematuria, positive nitrites, large leukocytes, >50 WBC, many bacteria. CT head negative for acute intracranial abnormalities. CXR unremarkable. CT chest abd pel with mild to moderate right hydronephrosis and upper hydroureter, no nephroureterolithiasis noted on either side.  CT imaging also revealed a well-circumscribed 3.9 x 5.3 cm lesion in the right adnexa, recommended MRI pelvis versus pelvic ultrasound for further evaluation.  CT imaging also noted bilateral trace pleural effusions with atelectatic changes in bilateral lower lobes.  Patient started on IV Rocephin  and given 1 L LR bolus in ED.  TRH asked to evaluate patient for admission.  Review of Systems: As mentioned in the history of present illness. All other systems reviewed and are negative. Past Medical History:  Diagnosis Date   Anemia    Asthma    Baker's cyst    Cancer (HCC)    skin Ca- ? basal cell    Chronic back pain    Chronic neck pain  Degenerative joint disease    Left shoulder; cervical spine, knees & hands    Gastroesophageal reflux disease    Hiatal hernia; distal esophageal web requiring dilatation; gastric polyps; gastritis; refuses colonoscopy   GERD (gastroesophageal reflux disease)    History of stress test 1990's   stress test done under the care of Dr. Macdonald Savoy & Dr. Katheryne Pane, now being followed  by Dr. Amanda Jungling- in [place] , recently seen & told to f/U in one yr.    Hyperlipidemia 04/27/2011   Hypertension    Hypothyroidism    Lymphocytic thyroiditis    Mild carotid artery disease (HCC)    Multiple thyroid  nodules 2010   Adenomatous; thyroidectomy in 2010   Orthostasis    Osteoarthritis of right knee 07/27/2014   Pneumonia    hosp. for pneumonia- long time ago    Primary localized osteoarthrosis of right shoulder 06/21/2015   Seizures (HCC)    yes- as a child- & into adult years, states she took med. for them at one time, stopped at 32 yrs. of age    Syncope and collapse 2007   Possible CVA in 2012 with left lower extremity weakness; refused hospitalization; CT-Atrophy and chronic microvascular ischemic change.    Past Surgical History:  Procedure Laterality Date   ABDOMINAL HYSTERECTOMY     fibroids   CATARACT EXTRACTION     Bilateral; redo surgery on the right for incomplete primary procedure   COLONOSCOPY  Remote   ESOPHAGOGASTRODUODENOSCOPY  11/2009   Dr. Nolene Baumgarten: probable distal web s/p dilation small hiatal hernia/gastric polyps/mild gastritis, appeared to have narrowing at the junction of D1 and D2 dilated up to 12 mm   ESOPHAGOGASTRODUODENOSCOPY N/A 03/21/2015   Surgeon: Alyce Jubilee, MD; proximal esophageal web s/p dilation, multiple gastric polyps s/p multiple biopsies, mild erosive gastritis s/p biopsy.  Pathology with fundic gland polyps, chronic gastritis, negative for H. pylori.   EYE SURGERY     PROLAPSED UTERINE FIBROID LIGATION     outcomed with rectocele & cystocele   SAVORY DILATION N/A 03/21/2015   Procedure: SAVORY DILATION;  Surgeon: Alyce Jubilee, MD;  Location: [facility] END[facility] SUITE;  Service: Endoscopy;  Laterality: N/A;   SHOULDER SURGERY     left   TOTAL KNEE ARTHROPLASTY Right 07/27/2014   Procedure: RIGHT TOTAL KNEE ARTHROPLASTY;  Surgeon: Neville Barbone, MD;  Location: [facility] OR;  Service: Orthopedics;  Laterality: Right;   TOTAL SHOULDER  ARTHROPLASTY Right 06/21/2015   Procedure: RIGHT TOTAL SHOULDER ARTHROPLASTY;  Surgeon: Osa Blase, MD;  Location: [facility] OR;  Service: Orthopedics;  Laterality: Right;   TOTAL THYROIDECTOMY  2010   Social History:  reports that she quit smoking about 45 years ago. Her smoking use included cigarettes. She started smoking about 65 years ago. She has a 16 pack-year smoking history. She has never used smokeless tobacco. She reports that she does not currently use alcohol. She reports that she does not use drugs.  Allergies  Allergen Reactions   Celecoxib Shortness Of Breath   Dexlansoprazole  Anaphylaxis and Other (See Comments)    abd pain   Famotidine Other (See Comments)    Makes her feel "very bad"    Family History  Problem Relation Age of Onset   Heart disease Mother    Gallbladder disease Mother    Heart failure Mother    Diabetes Mother    Breast cancer Sister        30 years ago   Diabetes Sister    Heart  attack Father    Diabetes Maternal Grandmother    Colon cancer Neg Hx    Stroke Neg Hx     Prior to Admission medications   Medication Sig Start Date End Date Taking? Authorizing Provider  albuterol  (VENTOLIN  HFA) 108 (90 Base) MCG/ACT inhaler Inhale 1-2 puffs into the lungs every 4 (four) hours as needed for wheezing or shortness of breath. 05/06/24  Yes [provider]  aspirin  EC 81 MG tablet Take 81 mg by mouth daily. Swallow whole.   Yes [provider]  carvedilol (COREG) 12.5 MG tablet Take 1 tablet (12.5 mg total) by mouth 2 (two) times daily. 05/11/24 05/11/25 Yes Justina Oman, MD  cefadroxil (DURICEF) 500 MG capsule Take 1 capsule (500 mg total) by mouth daily for 3 days. 05/11/24 05/14/24 Yes Justina Oman, MD  cloNIDine  (CATAPRES ) 0.1 MG tablet Take 1 tablet (0.1 mg total) by mouth daily. 05/11/24  Yes Justina Oman, MD  clopidogrel  (PLAVIX ) 75 MG tablet Take 1 tablet (75 mg total) by mouth daily for 19 days. 05/12/24 05/31/24 Yes Justina Oman, MD   diazepam  (VALIUM ) 5 MG tablet Take 2.5 mg by mouth daily as needed for anxiety. 05/29/23  Yes [provider]  ferrous sulfate  325 (65 FE) MG tablet Take 325 mg by mouth. Takes one on Monday, Wednesday and Friday   Yes [provider]  fluticasone  (FLONASE ) 50 MCG/ACT nasal spray Place 2 sprays into both nostrils daily. 12/02/23  Yes Kandice Orleans, MD  levothyroxine  (SYNTHROID ) 88 MCG tablet TAKE 1 TABLET DAILY ON MONDAY THROUGH SATURDAY AND ONE-HALF (1/2) TABLET ON SUNDAY Patient taking differently: Take 88 mcg by mouth See admin instructions. TAKE 1 TABLET DAILY ON MONDAY THROUGH SATURDAY AND ONE-HALF (1/2) TABLET ON SUNDAY 10/10/22  Yes Lajean Pike, MD  pantoprazole  (PROTONIX ) 40 MG tablet Take 1 tablet (40 mg total) by mouth 2 (two) times daily before a meal. 11/06/23  Yes April Knack, Kristen S, PA-C  pravastatin  (PRAVACHOL ) 40 MG tablet Take 1 tablet (40 mg total) by mouth daily. 05/11/24  Yes Justina Oman, MD  senna (SENOKOT) 8.6 MG tablet Take 2 tablets by mouth at bedtime.   Yes [provider]  sodium chloride  0.9 % nebulizer solution Take 3 mLs by nebulization daily as needed for wheezing. 04/22/24  Yes Corbin Dess, [place]    Physical Exam: Vitals:   05/13/24 0756 05/13/24 0758 05/13/24 0839 05/13/24 1000  BP:    132/60  Pulse:   70 68  Resp:   20 14  SpO2: 95%   98%  Weight:  53 kg    Height:  5\' 1"  (1.549 m)     Physical Exam Constitutional:      Comments: Chronically ill-appearing elderly female, laying in bed, NAD.  HENT:     Head: Normocephalic and atraumatic.     Mouth/Throat:     Mouth: Mucous membranes are dry.     Pharynx: Oropharynx is clear. No oropharyngeal exudate.  Eyes:     General: No scleral icterus.    Extraocular Movements: Extraocular movements intact.     Conjunctiva/sclera: Conjunctivae normal.     Pupils: Pupils are equal, round, and reactive to light.  Cardiovascular:     Rate and Rhythm: Normal rate and regular  rhythm.     Heart sounds: Normal heart sounds. No murmur heard.    No friction rub. No gallop.  Pulmonary:     Effort: Pulmonary effort is normal. No respiratory distress.  Breath sounds: Normal breath sounds. No wheezing, rhonchi or rales.  Abdominal:     General: Bowel sounds are normal. There is no distension.     Palpations: Abdomen is soft.     Tenderness: There is no abdominal tenderness. There is no guarding or rebound.  Musculoskeletal:        General: Normal range of motion.     Cervical back: Normal range of motion.     Comments: Trace edema in right lower extremity.  Skin:    General: Skin is warm and dry.     Comments: Decreased central and peripheral skin turgor.  Neurological:     General: No focal deficit present.     Mental Status: She is alert.     Comments: Oriented to person, place, but not to time.  Patient is able to recognize and name her son and daughter at bedside.  Cranial nerves grossly intact.  No focal deficits noted, patient moving all extremities spontaneously.  Psychiatric:        Mood and Affect: Mood normal.        Behavior: Behavior normal.     Data Reviewed:  There are no new results to review at this time.     Latest Ref Rng & Units 05/13/2024    8:48 AM 05/13/2024    8:47 AM 05/13/2024    8:35 AM  CBC  WBC 4.0 - 10.5 K/uL   13.7   Hemoglobin 12.0 - 15.0 g/dL 9.9  9.2  9.3   Hematocrit 36.0 - 46.0 % 29.0  27.0  28.5   Platelets 150 - 400 K/uL   85       Latest Ref Rng & Units 05/13/2024    8:48 AM 05/13/2024    8:47 AM 05/13/2024    8:35 AM  CMP  Glucose 70 - 99 mg/dL 409   811   BUN 8 - 23 mg/dL 49   56   Creatinine 9.14 - 1.00 mg/dL 7.82   9.56   Sodium 213 - 145 mmol/L 134  134  133   Potassium 3.5 - 5.1 mmol/L 3.8  3.8  3.9   Chloride 98 - 111 mmol/L 102   101   CO2 22 - 32 mmol/L   20   Calcium  8.9 - 10.3 mg/dL   8.1   Total Protein 6.5 - 8.1 g/dL   5.4   Total Bilirubin 0.0 - 1.2 mg/dL   0.6   Alkaline Phos 38 - 126 U/L    98   AST 15 - 41 U/L   29   ALT 0 - 44 U/L   17    Lactic Acid, Venous    Component Value Date/Time   LATICACIDVEN 1.5 05/13/2024 0849   Urinalysis    Component Value Date/Time   COLORURINE AMBER (A) 05/13/2024 1037   APPEARANCEUR TURBID (A) 05/13/2024 1037   LABSPEC 1.016 05/13/2024 1037   PHURINE 5.0 05/13/2024 1037   GLUCOSEU NEGATIVE 05/13/2024 1037   HGBUR MODERATE (A) 05/13/2024 1037   BILIRUBINUR NEGATIVE 05/13/2024 1037   BILIRUBINUR negative 12/14/2023 1454   KETONESUR 5 (A) 05/13/2024 1037   PROTEINUR 100 (A) 05/13/2024 1037   UROBILINOGEN 0.2 12/14/2023 1454   UROBILINOGEN 0.2 08/02/2012 1603   NITRITE POSITIVE (A) 05/13/2024 1037   LEUKOCYTESUR LARGE (A) 05/13/2024 1037    Drugs of Abuse     Component Value Date/Time   LABOPIA NONE DETECTED 05/13/2024 1037   COCAINSCRNUR NONE DETECTED 05/13/2024 1037  LABBENZ POSITIVE (A) 05/13/2024 1037   AMPHETMU NONE DETECTED 05/13/2024 1037   THCU NONE DETECTED 05/13/2024 1037   LABBARB NONE DETECTED 05/13/2024 1037    Troponins: 65 > 32  VBG with pH 7.39, pCO2 33.4, bicarb 20.6  D-dimer 7.11  TSH 2.92  CT CHEST ABDOMEN PELVIS WO CONTRAST Result Date: 05/13/2024 CLINICAL DATA:  AMS, cough, abdominal pain EXAM: CT CHEST, ABDOMEN AND PELVIS WITHOUT CONTRAST TECHNIQUE: Multidetector CT imaging of the chest, abdomen and pelvis was performed following the standard protocol without IV contrast. RADIATION DOSE REDUCTION: This exam was performed according to the departmental dose-optimization program which includes automated exposure control, adjustment of the mA and/or kV according to patient size and/or use of iterative reconstruction technique. COMPARISON:  CT scan chest, abdomen and pelvis from 09/30/2018. FINDINGS: CT CHEST FINDINGS Cardiovascular: Normal cardiac size. No pericardial effusion. No aortic aneurysm. There are coronary artery calcifications, in keeping with coronary artery disease. There are also moderate to  severe peripheral atherosclerotic vascular calcifications of thoracic aorta and its major branches. Mediastinum/Nodes: Visualized thyroid  gland appears grossly unremarkable. No solid / cystic mediastinal masses. The esophagus is nondistended precluding optimal assessment. No mediastinal or axillary lymphadenopathy by size criteria. Evaluation of bilateral hila is limited due to lack on intravenous contrast: however, no large hilar lymphadenopathy identified. Lungs/Pleura: The central tracheo-bronchial tree is patent. Redemonstration of biapical pleuroparenchymal disease, which appears slightly more prominent than the prior exam. For example, a peripheral solid nodule along the right lung apex laterally previously measured 1.3 x 1.9 cm, currently 1.5 x 2.2 cm. There are new bilateral trace pleural effusions with associated atelectatic changes in the bilateral lower lobes. There are several, sub 4 mm, calcified and noncalcified nodules throughout bilateral lungs (marked with electronic arrow sign on series 100). Musculoskeletal: The visualized soft tissues of the chest wall are grossly unremarkable. No suspicious osseous lesions. There are mild to moderate multilevel degenerative changes in the visualized spine. 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 Note is made of bilateral shoulder arthroplasty. CT ABDOMEN PELVIS FINDINGS Hepatobiliary: The liver is normal in size. There is subtle liver surface irregularity/nodularity, favoring cirrhosis. No suspicious mass. No intrahepatic or extrahepatic bile duct dilation. No calcified gallstones. Normal gallbladder wall thickness. No pericholecystic inflammatory changes. Pancreas: Unremarkable. No pancreatic ductal dilatation or surrounding inflammatory changes. Spleen: Within normal limits. No focal lesion. Adrenals/Urinary Tract: Adrenal glands are unremarkable. No suspicious renal mass within the limitations of this unenhanced exam. No  nephroureterolithiasis on either side. Note is made of mild-to-moderate right hydronephrosis and upper hydroureter. Rest of the right ureter is not well visualized due to lack of intravenous contrast. No left hydronephrosis. Urinary bladder is under distended, precluding optimal assessment. However, no large mass or stones identified. No perivesical fat stranding. Stomach/Bowel: No disproportionate dilation of the small or large bowel loops. No evidence of abnormal bowel wall thickening or inflammatory changes. The appendix was not visualized; however there is no acute inflammatory process in the right lower quadrant. Vascular/Lymphatic: There is trace amount of free fluid in the dependent pelvis. No walled-off abscess or loculated collection. No pneumoperitoneum. No abdominal or pelvic lymphadenopathy, by size criteria. No aneurysmal dilation of the major abdominal arteries. There are moderate peripheral atherosclerotic vascular calcifications of the aorta and its major branches. Reproductive: The uterus is surgically absent. There is a well-circumscribed 3.9 x 5.3 cm lesion in the right adnexa, with internal CT attenuation of 30-31 Hounsfield units. This is incompletely characterized on the current  examination but suspicious. Further evaluation with contrast-enhanced MRI pelvis versus pelvic ultrasound is recommended. Other: The visualized soft tissues and abdominal wall are unremarkable. Musculoskeletal: No suspicious osseous lesions. There are moderate multilevel degenerative changes in the visualized spine. IMPRESSION: 1. There is mild-to-moderate right hydronephrosis and upper hydroureter. No nephroureterolithiasis on either side. Consider further evaluation with contrast-enhanced study with delayed images. 2. There is a well-circumscribed 3.9 x 5.3 cm lesion in the right adnexa, incompletely characterized on the current examination but suspicious. Further evaluation with contrast-enhanced MRI pelvis versus  pelvic ultrasound is recommended. 3. There are new bilateral trace pleural effusions with associated atelectatic changes in the bilateral lower lobes. 4. Multiple other nonacute observations, as described above. Aortic Atherosclerosis (ICD10-I70.0). Electronically Signed   By: Beula Brunswick M.D.   On: 05/13/2024 11:22   DG Chest Port 1 View Result Date: 05/13/2024 CLINICAL DATA:  Altered mental status. Patient became unresponsive on the toilet. EXAM: PORTABLE CHEST 1 VIEW COMPARISON:  Radiographs 05/09/2024 and 04/22/2024.  CT 08/02/2012. FINDINGS: 0948 hours. Patient is rotated to the left. The heart size and mediastinal contours are stable with aortic atherosclerosis. There are surgical clips at the thoracic inlet. Patchy left basilar opacities unchanged from the recent prior study. The right lung remains clear. No evidence of pneumothorax or significant pleural effusion. No acute osseous findings status post bilateral shoulder arthroplasty. IMPRESSION: Stable examination with mild residual left basilar atelectasis or infiltrate. No new findings. Electronically Signed   By: Elmon Hagedorn M.D.   On: 05/13/2024 10:07   CT HEAD WO CONTRAST Result Date: 05/13/2024 CLINICAL DATA:  Mental status change, unknown cause. EXAM: CT HEAD WITHOUT CONTRAST TECHNIQUE: Contiguous axial images were obtained from the base of the skull through the vertex without intravenous contrast. RADIATION DOSE REDUCTION: This exam was performed according to the departmental dose-optimization program which includes automated exposure control, adjustment of the mA and/or kV according to patient size and/or use of iterative reconstruction technique. COMPARISON:  CT head without contrast 05/09/2024. MR head without contrast 05/09/2024. FINDINGS: Brain: No acute infarct, hemorrhage, or mass lesion is present. Remote lacunar infarct is again noted in the posterior right lentiform nucleus. Chronic ischemic changes are again noted in the  thalami bilaterally. Moderate generalized atrophy and diffuse white matter disease is stable. The ventricles are proportionate to the degree of atrophy. No significant extraaxial fluid collection is present. The brainstem and cerebellum are within normal limits. Midline structures are within normal limits. Vascular: Atherosclerotic calcifications are present within the cavernous internal carotid arteries bilaterally. No hyperdense vessel is present. Skull: Calvarium is intact. No focal lytic or blastic lesions are present. No significant extracranial soft tissue lesion is present. Sinuses/Orbits: The paranasal sinuses and mastoid air cells are clear. Bilateral lens replacements are noted. Globes and orbits are otherwise unremarkable. IMPRESSION: 1. No acute intracranial abnormality or significant interval change. 2. Stable remote lacunar infarct of the posterior right lentiform nucleus. 3. Stable moderate generalized atrophy and diffuse white matter disease. This likely reflects the sequela of chronic microvascular ischemia. Electronically Signed   By: Audree Leas M.D.   On: 05/13/2024 09:02    Assessment and Plan: No notes have been filed under this hospital service. Service: Hospitalist  Syncope, suspect orthostatic Dehydration Patient presenting with AMS after syncopal episode earlier this morning. Presentation consistent with likely orthostatic syncope given occurred after standing from toilet. Patient does appear dry on exam and with AKI and poor PO intake. May be difficult to obtain orthostatics given patient  is debilitated and unable to stand, but will attempt in ED. She is s/p 1L LR bolus. Have started her on maintenance IVF at 100cc/hr for 24 hours. Vital signs are stable. Recent admission at [facility] with ECHO and carotid ultrasounds. ECHO 5/11 with LVEF 70-75%, hyperdynamic function, no RWMA, G1DD, mildly dilated LA. Bilateral carotid ultrasound 5/11 with stable appearance of bilateral carotid  bulb and proximal ICA plaque (R > L). Estimated bilateral ICA stenoses <50%. CT head today without any acute intracranial abnormalities and no new focal deficits on exam. Glucose 107 on admission (recent A1c 5.4% last week). -f/u orthostatic vital signs -s/p 1L LR bolus, now on maintenance IVF at 100cc/hr for 24 hours -telemetry -strict I/O's -admit to inpatient/progressive  AKI on CKD 3B Mild-to-moderate hydronephrosis and hydroureter Patient with baseline Cr around 1.1. Cr was noted to be 2.61 > 2.71 at recent discharge from [facility]. On admission, Cr 3.26. Suspect this is secondary to dehydration as noted above. CT imaging showing mild-to-moderate R hydronephrosis but no stones noted. Bladder scan in ED with 88cc of urine. Will obtain renal ultrasound and have asked RN to place purewick for better estimation of urine output. She does also have a UTI. -f/u renal ultrasound -s/p 1L LR bolus, now on mIVF at 100cc/hr for 24 hours -q4h bladder scans, consider foley catheter if >300cc -trend kidney function -strict I/O's, monitor UOP -avoid nephrotoxic medications as able  Acute cystitis with hematuria -noted during prior admission as well but no urine culture on file -she was discharged on cefadroxil but has only taken 1 dose thus far -she is not septic -afebrile but with mild leukocytosis -started on IV rocephin  in ED empirically, will continue on admission until urine culture results -f/u urine culture, blood cultures  HTN -BP 132/60 in ED -patient on coreg 12.5mg  BID and clonidine  0.1mg  daily at home -holding home antihypertensives to prevent blunting of sympathetic response given likely orthostatic syncope PTA -trend BP curve  Type 2 NSTEMI Incomplete RBBB -troponins 65 > 32 -chest pain free -discussed with cardiology who agree that EKG with sinus rhythm and incomplete RBBB -suspect demand ischemia 2/2 dehydration/orthostasis -will stop trending troponins given they have  peaked  Trace bilateral pleural effusions with associated atelectatic changes in bilateral lower lobes -saturating well on RA -mobilize OOB (PT/OT ordered) -incentive spirometry -continuous pulse oximetry  Chronic anemia Hx of iron deficiency anemia -baseline hgb around 9.3 -hgb on admission 9.3, no reported bleeding events per family -f/u iron studies -trend hgb curve, transfuse if hgb <7  Acute on chronic thrombocytopenia -baseline PLT around 130 -on admission, PLT 85 -SCDs for dvt ppx (holding pharmacological options) -trend PLT curve  Elevated D-dimer -d-dimer 7.11 -patient not hypoxic or tachycardic and no chest pain -discussed with son and daughter, would not want to pursue lifelong anticoagulation if PE were to be found on imaging and thus will hold off on obtaining V/Q scan given will not change management -resumed home aspirin  and plavix   3.9 x 5.3 cm R adnexal lesion -noted on CT imaging -per family, patient would not wish to pursue aggressive treatment if lesion found to be cancerous and likely would not be a candidate given age and comorbid conditions -will hold off on MRI imaging or pelvic ultrasound given no change in management  Dysphagia Protein calorie malnutrition Physical deconditioning -RD consulted -resume dysphagia 1 diet with thin liquids (recommended per last SLP eval at prior recent hospitalization) -PT/OT eval  Hx of TIA and CVA -resume home aspirin  and  plavix  (plavix  for 19 more days, then aspirin  monotherapy) -resume home statin -no new deficits on exam  COPD -stable with good saturations on RA, no wheezing on exam -continue home albuterol   Acquired hypothyroidism -TSH normal -resume home synthroid   GERD -resume home PPI  Dyslipidemia -LDL at goal on recent lipid panel -resume home statin  Anxiety -PDMP reviewed, patient on valium  2.5mg  daily prn -per son and daughter, patient does not take daily at home -holding home valium   given AMS   Advance Care Planning:   Code Status: Limited: Do not attempt resuscitation (DNR) -DNR-LIMITED -Do Not Intubate/DNI  CODE status confirmed with patient's son at bedside and with patient's daughter at bedside.  Consults: none  Family Communication: updated family at bedside  Severity of Illness: The appropriate patient status for this patient is INPATIENT. Inpatient status is judged to be reasonable and necessary in order to provide the required intensity of service to ensure the patient's safety. The patient's presenting symptoms, physical exam findings, and initial radiographic and laboratory data in the context of their chronic comorbidities is felt to place them at high risk for further clinical deterioration. Furthermore, it is not anticipated that the patient will be medically stable for discharge from the hospital within 2 midnights of admission.   * I certify that at the point of admission it is my clinical judgment that the patient will require inpatient hospital care spanning beyond 2 midnights from the point of admission due to high intensity of service, high risk for further deterioration and high frequency of surveillance required.*  Portions of this note were generated with Dragon dictation software. Dictation errors may occur despite best attempts at proofreading.  Author: Linsay Vogt H Cauy Melody, MD 05/13/2024 12:29 PM  For on call review [email or web address].

## 2024-05-13 NOTE — ED Provider Notes (Signed)
 [facility] Provider Note   CSN: [id number] Arrival date & time: 05/13/24  0751     History  Chief Complaint  Patient presents with   Altered Mental Status    Pt bib ems from home. Pt's son woke her up this morning, like normal, set pt on toilet where she went unresponsive and called 911. PMH: TIA, UTI    Christine Leblanc is a 89 y.o. female.   Altered Mental Status    89 year old female with medical history significant for syncope, GERD, asthma, HLD, chronic neck and back pain, hypothyroidism, seizures (no seizure since age 55), HTN, osteoarthritis, DNR, CKD, hx of TIA presenting to the ED with an episode of unresponsiveness and AMS.  The history is provided by EMS as no family were immediately bedside to provide additional HPI.  The patient was reportedly woken up by her son this morning, the patient was sat on the toilet where she had an episode of unresponsiveness and 911 was called.  The patient arrives acutely altered and confused with incomprehensible speech, arrives GCS 13 (3-4-6), ABC intact.  Home Medications Prior to Admission medications   Medication Sig Start Date End Date Taking? Authorizing Provider  albuterol  (VENTOLIN  HFA) 108 (90 Base) MCG/ACT inhaler Inhale 1-2 puffs into the lungs every 4 (four) hours as needed for wheezing or shortness of breath. 05/06/24  Yes [provider]  aspirin  EC 81 MG tablet Take 81 mg by mouth daily. Swallow whole.   Yes [provider]  carvedilol (COREG) 12.5 MG tablet Take 1 tablet (12.5 mg total) by mouth 2 (two) times daily. 05/11/24 05/11/25 Yes Justina Oman, MD  cefadroxil (DURICEF) 500 MG capsule Take 1 capsule (500 mg total) by mouth daily for 3 days. 05/11/24 05/14/24 Yes Justina Oman, MD  cloNIDine  (CATAPRES ) 0.1 MG tablet Take 1 tablet (0.1 mg total) by mouth daily. 05/11/24  Yes Justina Oman, MD  clopidogrel  (PLAVIX ) 75 MG tablet Take 1 tablet (75 mg total) by mouth  daily for 19 days. 05/12/24 05/31/24 Yes Justina Oman, MD  diazepam  (VALIUM ) 5 MG tablet Take 2.5 mg by mouth daily as needed for anxiety. 05/29/23  Yes [provider]  ferrous sulfate  325 (65 FE) MG tablet Take 325 mg by mouth. Takes one on Monday, Wednesday and Friday   Yes [provider]  fluticasone  (FLONASE ) 50 MCG/ACT nasal spray Place 2 sprays into both nostrils daily. 12/02/23  Yes Patel, Ammie Bale, MD  levothyroxine  (SYNTHROID ) 88 MCG tablet TAKE 1 TABLET DAILY ON MONDAY THROUGH SATURDAY AND ONE-HALF (1/2) TABLET ON SUNDAY Patient taking differently: Take 88 mcg by mouth See admin instructions. TAKE 1 TABLET DAILY ON MONDAY THROUGH SATURDAY AND ONE-HALF (1/2) TABLET ON SUNDAY 10/10/22  Yes Lajean Pike, MD  pantoprazole  (PROTONIX ) 40 MG tablet Take 1 tablet (40 mg total) by mouth 2 (two) times daily before a meal. 11/06/23  Yes April Knack, Kristen S, PA-C  pravastatin  (PRAVACHOL ) 40 MG tablet Take 1 tablet (40 mg total) by mouth daily. 05/11/24  Yes Justina Oman, MD  senna (SENOKOT) 8.6 MG tablet Take 2 tablets by mouth at bedtime.   Yes [provider]  sodium chloride  0.9 % nebulizer solution Take 3 mLs by nebulization daily as needed for wheezing. 04/22/24  Yes Corbin Dess, PA-C      Allergies    Celecoxib, Dexlansoprazole , and Famotidine    Review of Systems   Review of Systems  Unable to perform ROS: Mental  status change    Physical Exam Updated Vital Signs BP 132/60   Pulse 68   Temp 97.9 F (36.6 C) (Rectal)   Resp 14   Ht 5\' 1"  (1.549 m)   Wt 53 kg   SpO2 98%   BMI 22.08 kg/m  Physical Exam Vitals and nursing note reviewed.  Constitutional:      General: She is not in acute distress.    Appearance: She is well-developed.     Comments: GCS 13( 3-4-6), AAOx1  HENT:     Head: Normocephalic and atraumatic.     Mouth/Throat:     Mouth: Mucous membranes are dry.  Eyes:     Conjunctiva/sclera: Conjunctivae normal.  Cardiovascular:      Rate and Rhythm: Normal rate and regular rhythm.     Heart sounds: No murmur heard. Pulmonary:     Effort: Pulmonary effort is normal. No respiratory distress.     Breath sounds: Normal breath sounds.  Abdominal:     Palpations: Abdomen is soft.     Tenderness: There is no abdominal tenderness.  Musculoskeletal:        General: No swelling.     Cervical back: Neck supple.  Skin:    General: Skin is warm and dry.     Capillary Refill: Capillary refill takes less than 2 seconds.  Neurological:     Mental Status: She is alert.     Comments: AAO x 1 (disoriented to place and year), no clear cranial nerve deficit although appears to have slight left-sided neglect, 5 out of 5 strength in the bilateral upper and lower extremities, unable to complete sensory exam due to pt mental status  Psychiatric:        Mood and Affect: Mood normal.     ED Results / Procedures / Treatments   Labs (all labs ordered are listed, but only abnormal results are displayed) Labs Reviewed  COMPREHENSIVE METABOLIC PANEL WITH GFR - Abnormal; Notable for the following components:      Result Value   Sodium 133 (*)    CO2 20 (*)    Glucose, Bld 107 (*)    BUN 56 (*)    Creatinine, Ser 3.26 (*)    Calcium  8.1 (*)    Total Protein 5.4 (*)    Albumin 2.0 (*)    GFR, Estimated 13 (*)    All other components within normal limits  CBC WITH DIFFERENTIAL/PLATELET - Abnormal; Notable for the following components:   WBC 13.7 (*)    RBC 3.18 (*)    Hemoglobin 9.3 (*)    HCT 28.5 (*)    RDW 16.7 (*)    Platelets 85 (*)    Neutro Abs 11.4 (*)    Lymphs Abs 0.5 (*)    Monocytes Absolute 1.5 (*)    Abs Immature Granulocytes 0.12 (*)    All other components within normal limits  RAPID URINE DRUG SCREEN, HOSP PERFORMED - Abnormal; Notable for the following components:   Benzodiazepines POSITIVE (*)    All other components within normal limits  URINALYSIS, W/ REFLEX TO CULTURE (INFECTION SUSPECTED) - Abnormal;  Notable for the following components:   Color, Urine AMBER (*)    APPearance TURBID (*)    Hgb urine dipstick MODERATE (*)    Ketones, ur 5 (*)    Protein, ur 100 (*)    Nitrite POSITIVE (*)    Leukocytes,Ua LARGE (*)    Bacteria, UA MANY (*)    Non Squamous  Epithelial 0-5 (*)    All other components within normal limits  D-DIMER, QUANTITATIVE - Abnormal; Notable for the following components:   D-Dimer, Quant 7.11 (*)    All other components within normal limits  CBG MONITORING, ED - Abnormal; Notable for the following components:   Glucose-Capillary 117 (*)    All other components within normal limits  I-STAT CHEM 8, ED - Abnormal; Notable for the following components:   Sodium 134 (*)    BUN 49 (*)    Creatinine, Ser 3.20 (*)    Glucose, Bld 109 (*)    Calcium , Ion 1.11 (*)    TCO2 20 (*)    Hemoglobin 9.9 (*)    HCT 29.0 (*)    All other components within normal limits  I-STAT VENOUS BLOOD GAS, ED - Abnormal; Notable for the following components:   pCO2, Ven 33.4 (*)    pO2, Ven 46 (*)    Acid-base deficit 4.0 (*)    Sodium 134 (*)    Calcium , Ion 1.12 (*)    HCT 27.0 (*)    Hemoglobin 9.2 (*)    All other components within normal limits  TROPONIN I (HIGH SENSITIVITY) - Abnormal; Notable for the following components:   Troponin I (High Sensitivity) 65 (*)    All other components within normal limits  TROPONIN I (HIGH SENSITIVITY) - Abnormal; Notable for the following components:   Troponin I (High Sensitivity) 32 (*)    All other components within normal limits  CULTURE, BLOOD (ROUTINE X 2)  CULTURE, BLOOD (ROUTINE X 2)  URINE CULTURE  AMMONIA  TSH  ETHANOL  I-STAT CG4 LACTIC ACID, ED  POC OCCULT BLOOD, ED    EKG EKG Interpretation Date/Time:  Wednesday May 13 2024 86:57:84 EDT Ventricular Rate:  72 PR Interval:  155 QRS Duration:  84 QT Interval:  426 QTC Calculation: 467 R Axis:   98  Text Interpretation: Sinus rhythm Right ventricular hypertrophy  Borderline ST elevation Confirmed by Rosealee Concha ([id number]) on 05/13/2024 8:11:25 AM  Radiology CT CHEST ABDOMEN PELVIS WO CONTRAST Result Date: 05/13/2024 CLINICAL DATA:  AMS, cough, abdominal pain EXAM: CT CHEST, ABDOMEN AND PELVIS WITHOUT CONTRAST TECHNIQUE: Multidetector CT imaging of the chest, abdomen and pelvis was performed following the standard protocol without IV contrast. RADIATION DOSE REDUCTION: This exam was performed according to the departmental dose-optimization program which includes automated exposure control, adjustment of the mA and/or kV according to patient size and/or use of iterative reconstruction technique. COMPARISON:  CT scan chest, abdomen and pelvis from 09/30/2018. FINDINGS: CT CHEST FINDINGS Cardiovascular: Normal cardiac size. No pericardial effusion. No aortic aneurysm. There are coronary artery calcifications, in keeping with coronary artery disease. There are also moderate to severe peripheral atherosclerotic vascular calcifications of thoracic aorta and its major branches. Mediastinum/Nodes: Visualized thyroid  gland appears grossly unremarkable. No solid / cystic mediastinal masses. The esophagus is nondistended precluding optimal assessment. No mediastinal or axillary lymphadenopathy by size criteria. Evaluation of bilateral hila is limited due to lack on intravenous contrast: however, no large hilar lymphadenopathy identified. Lungs/Pleura: The central tracheo-bronchial tree is patent. Redemonstration of biapical pleuroparenchymal disease, which appears slightly more prominent than the prior exam. For example, a peripheral solid nodule along the right lung apex laterally previously measured 1.3 x 1.9 cm, currently 1.5 x 2.2 cm. There are new bilateral trace pleural effusions with associated atelectatic changes in the bilateral lower lobes. There are several, sub 4 mm, calcified and noncalcified nodules throughout bilateral lungs (marked  with electronic arrow sign on series  100). Musculoskeletal: The visualized soft tissues of the chest wall are grossly unremarkable. No suspicious osseous lesions. There are mild to moderate multilevel degenerative changes in the visualized spine. 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 Note is made of bilateral shoulder arthroplasty. CT ABDOMEN PELVIS FINDINGS Hepatobiliary: The liver is normal in size. There is subtle liver surface irregularity/nodularity, favoring cirrhosis. No suspicious mass. No intrahepatic or extrahepatic bile duct dilation. No calcified gallstones. Normal gallbladder wall thickness. No pericholecystic inflammatory changes. Pancreas: Unremarkable. No pancreatic ductal dilatation or surrounding inflammatory changes. Spleen: Within normal limits. No focal lesion. Adrenals/Urinary Tract: Adrenal glands are unremarkable. No suspicious renal mass within the limitations of this unenhanced exam. No nephroureterolithiasis on either side. Note is made of mild-to-moderate right hydronephrosis and upper hydroureter. Rest of the right ureter is not well visualized due to lack of intravenous contrast. No left hydronephrosis. Urinary bladder is under distended, precluding optimal assessment. However, no large mass or stones identified. No perivesical fat stranding. Stomach/Bowel: No disproportionate dilation of the small or large bowel loops. No evidence of abnormal bowel wall thickening or inflammatory changes. The appendix was not visualized; however there is no acute inflammatory process in the right lower quadrant. Vascular/Lymphatic: There is trace amount of free fluid in the dependent pelvis. No walled-off abscess or loculated collection. No pneumoperitoneum. No abdominal or pelvic lymphadenopathy, by size criteria. No aneurysmal dilation of the major abdominal arteries. There are moderate peripheral atherosclerotic vascular calcifications of the aorta and its major branches. Reproductive: The uterus is surgically  absent. There is a well-circumscribed 3.9 x 5.3 cm lesion in the right adnexa, with internal CT attenuation of 30-31 Hounsfield units. This is incompletely characterized on the current examination but suspicious. Further evaluation with contrast-enhanced MRI pelvis versus pelvic ultrasound is recommended. Other: The visualized soft tissues and abdominal wall are unremarkable. Musculoskeletal: No suspicious osseous lesions. There are moderate multilevel degenerative changes in the visualized spine. IMPRESSION: 1. There is mild-to-moderate right hydronephrosis and upper hydroureter. No nephroureterolithiasis on either side. Consider further evaluation with contrast-enhanced study with delayed images. 2. There is a well-circumscribed 3.9 x 5.3 cm lesion in the right adnexa, incompletely characterized on the current examination but suspicious. Further evaluation with contrast-enhanced MRI pelvis versus pelvic ultrasound is recommended. 3. There are new bilateral trace pleural effusions with associated atelectatic changes in the bilateral lower lobes. 4. Multiple other nonacute observations, as described above. Aortic Atherosclerosis (ICD10-I70.0). Electronically Signed   By: Beula Brunswick M.D.   On: 05/13/2024 11:22   DG Chest Port 1 View Result Date: 05/13/2024 CLINICAL DATA:  Altered mental status. Patient became unresponsive on the toilet. EXAM: PORTABLE CHEST 1 VIEW COMPARISON:  Radiographs 05/09/2024 and 04/22/2024.  CT 08/02/2012. FINDINGS: 0948 hours. Patient is rotated to the left. The heart size and mediastinal contours are stable with aortic atherosclerosis. There are surgical clips at the thoracic inlet. Patchy left basilar opacities unchanged from the recent prior study. The right lung remains clear. No evidence of pneumothorax or significant pleural effusion. No acute osseous findings status post bilateral shoulder arthroplasty. IMPRESSION: Stable examination with mild residual left basilar atelectasis  or infiltrate. No new findings. Electronically Signed   By: Elmon Hagedorn M.D.   On: 05/13/2024 10:07   CT HEAD WO CONTRAST Result Date: 05/13/2024 CLINICAL DATA:  Mental status change, unknown cause. EXAM: CT HEAD WITHOUT CONTRAST TECHNIQUE: Contiguous axial images were obtained from the base of the skull through  the vertex without intravenous contrast. RADIATION DOSE REDUCTION: This exam was performed according to the departmental dose-optimization program which includes automated exposure control, adjustment of the mA and/or kV according to patient size and/or use of iterative reconstruction technique. COMPARISON:  CT head without contrast 05/09/2024. MR head without contrast 05/09/2024. FINDINGS: Brain: No acute infarct, hemorrhage, or mass lesion is present. Remote lacunar infarct is again noted in the posterior right lentiform nucleus. Chronic ischemic changes are again noted in the thalami bilaterally. Moderate generalized atrophy and diffuse white matter disease is stable. The ventricles are proportionate to the degree of atrophy. No significant extraaxial fluid collection is present. The brainstem and cerebellum are within normal limits. Midline structures are within normal limits. Vascular: Atherosclerotic calcifications are present within the cavernous internal carotid arteries bilaterally. No hyperdense vessel is present. Skull: Calvarium is intact. No focal lytic or blastic lesions are present. No significant extracranial soft tissue lesion is present. Sinuses/Orbits: The paranasal sinuses and mastoid air cells are clear. Bilateral lens replacements are noted. Globes and orbits are otherwise unremarkable. IMPRESSION: 1. No acute intracranial abnormality or significant interval change. 2. Stable remote lacunar infarct of the posterior right lentiform nucleus. 3. Stable moderate generalized atrophy and diffuse white matter disease. This likely reflects the sequela of chronic microvascular ischemia.  Electronically Signed   By: Audree Leas M.D.   On: 05/13/2024 09:02    Procedures Procedures    Medications Ordered in ED Medications  heparin  injection 5,000 Units (has no administration in time range)  acetaminophen  (TYLENOL ) tablet 650 mg (has no administration in time range)    Or  acetaminophen  (TYLENOL ) suppository 650 mg (has no administration in time range)  albuterol  (VENTOLIN  HFA) 108 (90 Base) MCG/ACT inhaler 1-2 puff (has no administration in time range)  aspirin  EC tablet 81 mg (has no administration in time range)  clopidogrel  (PLAVIX ) tablet 75 mg (has no administration in time range)  pravastatin  (PRAVACHOL ) tablet 40 mg (has no administration in time range)  pantoprazole  (PROTONIX ) EC tablet 40 mg (has no administration in time range)  levothyroxine  (SYNTHROID ) tablet 88 mcg (has no administration in time range)    And  levothyroxine  (SYNTHROID ) tablet 44 mcg (has no administration in time range)  lactated ringers  infusion (has no administration in time range)  cefTRIAXone  (ROCEPHIN ) 1 g in sodium chloride  0.9 % 100 mL IVPB (has no administration in time range)  lactated ringers  bolus 500 mL (0 mLs Intravenous Stopped 05/13/24 1040)  lactated ringers  bolus 500 mL (500 mLs Intravenous New Bag/Given 05/13/24 1244)  cefTRIAXone  (ROCEPHIN ) 1 g in sodium chloride  0.9 % 100 mL IVPB (1 g Intravenous New Bag/Given 05/13/24 1245)    ED Course/ Medical Decision Making/ A&P Clinical Course as of 05/13/24 1414  Wed May 13, 2024  1151 Nitrite(!): POSITIVE [JL]  1151 Leukocytes,Ua(!): LARGE [JL]  1151 Bacteria, UA(!): MANY [JL]  1151 WBC, UA: >50 [JL]  1211 Creatinine(!): 3.26 [JL]  1211 BUN(!): 56 [JL]    Clinical Course User Index [JL] Rosealee Concha, MD                                 Medical Decision Making Amount and/or Complexity of Data Reviewed Labs: ordered. Decision-making details documented in ED Course. Radiology: ordered.  Risk Decision regarding  hospitalization.    89 year old female with medical history significant for syncope, GERD, asthma, HLD, chronic neck and back pain, hypothyroidism, seizures (no seizure  since age 59), HTN, osteoarthritis, DNR, CKD, hx of TIA presenting to the ED with an episode of unresponsiveness and AMS.  The history is provided by EMS as no family were immediately bedside to provide additional HPI.  The patient was reportedly woken up by her son this morning, the patient was sat on the toilet where she had an episode of unresponsiveness and 911 was called.  The patient arrives acutely altered and confused with incomprehensible speech, arrives GCS 13 (3-4-6), ABC intact.  On arrival, the patient was afebrile by rectal temperature, not tachycardic or tachypneic, hemodynamically stable, saturating 95% on room air.  Physical exam revealed an acutely altered and confused elderly female, dry mucous membranes present, some left-sided neglect, no clear focal deficit.  Initial Assessment:   With the patient's presentation of altered mental status, most likely diagnosis is delerium 2/2 infectious etiology (UTI/CAP/URI) vs metabolic abnormality (Na/K/Mg/Ca) vs nonspecific etiology. Other diagnoses were considered including (but not limited to) CVA, ICH, intracranial mass, critical dehydration, heptatic dysfunction, uremia, hypercarbia, intoxication, endrocrine abnormality, toxidrome. These are considered less likely due to history of present illness and physical exam findings.   This is most consistent with an acute life/limb threatening illness complicated by underlying chronic conditions.  Initial Plan:  CTH to evaluate for intracranial etiology of patient's symptoms  Screening labs including CBC and Metabolic panel to evaluate for infectious or metabolic etiology of disease.  Urinalysis with reflex culture ordered to evaluate for UTI or relevant urologic/nephrologic pathology.  CXR to evaluate for structural/infectious  intrathoracic pathology.  TSH for evaluation for endrocrine etiology Drug screen for toxidrome evaluation VBG for acid/base status and further toxidrome evaulation EKG to evaluate for cardiac pathology Objective evaluation as below reviewed   Initial Study Results:   Laboratory  All laboratory results reviewed without evidence of clinically relevant pathology.   Exceptions include: Multiple laboratory abnormalities to include CMP with evidence of an AKI with a serum creatinine of 3.26 and a BUN of 56, LFTs normal, CBC with a leukocytosis to 13.7, hemoglobin 9.3 similar to several days ago, urinalysis grossly positive for UTI with positive nitrites, large leukocytes, greater than 50 WBCs and many bacteria present.  Of note, the patient had been diagnosed with a UTI in the hospital and had been discharged on a cefadroxil.  No recent culture data, urine culture collected and pending prior to antibiotic administration.  Will administer a third-generation cephalosporin Rocephin .  Patient with mildly elevated troponin to 65, repeat downtrending to 32, low concern for ACS, suspect likely demand ischemia in the setting of hypovolemia.  EKG EKG was reviewed independently. Rate, rhythm, axis, intervals all examined and without medically relevant abnormality. ST segments without concerns for elevations.    Radiology:  All images reviewed independently. Agree with radiology report at this time.   CT CHEST ABDOMEN PELVIS WO CONTRAST Result Date: 05/13/2024 CLINICAL DATA:  AMS, cough, abdominal pain EXAM: CT CHEST, ABDOMEN AND PELVIS WITHOUT CONTRAST TECHNIQUE: Multidetector CT imaging of the chest, abdomen and pelvis was performed following the standard protocol without IV contrast. RADIATION DOSE REDUCTION: This exam was performed according to the departmental dose-optimization program which includes automated exposure control, adjustment of the mA and/or kV according to patient size and/or use of iterative  reconstruction technique. COMPARISON:  CT scan chest, abdomen and pelvis from 09/30/2018. FINDINGS: CT CHEST FINDINGS Cardiovascular: Normal cardiac size. No pericardial effusion. No aortic aneurysm. There are coronary artery calcifications, in keeping with coronary artery disease. There are also moderate to  severe peripheral atherosclerotic vascular calcifications of thoracic aorta and its major branches. Mediastinum/Nodes: Visualized thyroid  gland appears grossly unremarkable. No solid / cystic mediastinal masses. The esophagus is nondistended precluding optimal assessment. No mediastinal or axillary lymphadenopathy by size criteria. Evaluation of bilateral hila is limited due to lack on intravenous contrast: however, no large hilar lymphadenopathy identified. Lungs/Pleura: The central tracheo-bronchial tree is patent. Redemonstration of biapical pleuroparenchymal disease, which appears slightly more prominent than the prior exam. For example, a peripheral solid nodule along the right lung apex laterally previously measured 1.3 x 1.9 cm, currently 1.5 x 2.2 cm. There are new bilateral trace pleural effusions with associated atelectatic changes in the bilateral lower lobes. There are several, sub 4 mm, calcified and noncalcified nodules throughout bilateral lungs (marked with electronic arrow sign on series 100). Musculoskeletal: The visualized soft tissues of the chest wall are grossly unremarkable. No suspicious osseous lesions. There are mild to moderate multilevel degenerative changes in the visualized spine. 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 Note is made of bilateral shoulder arthroplasty. CT ABDOMEN PELVIS FINDINGS Hepatobiliary: The liver is normal in size. There is subtle liver surface irregularity/nodularity, favoring cirrhosis. No suspicious mass. No intrahepatic or extrahepatic bile duct dilation. No calcified gallstones. Normal gallbladder wall thickness. No pericholecystic  inflammatory changes. Pancreas: Unremarkable. No pancreatic ductal dilatation or surrounding inflammatory changes. Spleen: Within normal limits. No focal lesion. Adrenals/Urinary Tract: Adrenal glands are unremarkable. No suspicious renal mass within the limitations of this unenhanced exam. No nephroureterolithiasis on either side. Note is made of mild-to-moderate right hydronephrosis and upper hydroureter. Rest of the right ureter is not well visualized due to lack of intravenous contrast. No left hydronephrosis. Urinary bladder is under distended, precluding optimal assessment. However, no large mass or stones identified. No perivesical fat stranding. Stomach/Bowel: No disproportionate dilation of the small or large bowel loops. No evidence of abnormal bowel wall thickening or inflammatory changes. The appendix was not visualized; however there is no acute inflammatory process in the right lower quadrant. Vascular/Lymphatic: There is trace amount of free fluid in the dependent pelvis. No walled-off abscess or loculated collection. No pneumoperitoneum. No abdominal or pelvic lymphadenopathy, by size criteria. No aneurysmal dilation of the major abdominal arteries. There are moderate peripheral atherosclerotic vascular calcifications of the aorta and its major branches. Reproductive: The uterus is surgically absent. There is a well-circumscribed 3.9 x 5.3 cm lesion in the right adnexa, with internal CT attenuation of 30-31 Hounsfield units. This is incompletely characterized on the current examination but suspicious. Further evaluation with contrast-enhanced MRI pelvis versus pelvic ultrasound is recommended. Other: The visualized soft tissues and abdominal wall are unremarkable. Musculoskeletal: No suspicious osseous lesions. There are moderate multilevel degenerative changes in the visualized spine. IMPRESSION: 1. There is mild-to-moderate right hydronephrosis and upper hydroureter. No nephroureterolithiasis on  either side. Consider further evaluation with contrast-enhanced study with delayed images. 2. There is a well-circumscribed 3.9 x 5.3 cm lesion in the right adnexa, incompletely characterized on the current examination but suspicious. Further evaluation with contrast-enhanced MRI pelvis versus pelvic ultrasound is recommended. 3. There are new bilateral trace pleural effusions with associated atelectatic changes in the bilateral lower lobes. 4. Multiple other nonacute observations, as described above. Aortic Atherosclerosis (ICD10-I70.0). Electronically Signed   By: Beula Brunswick M.D.   On: 05/13/2024 11:22   DG Chest Port 1 View Result Date: 05/13/2024 CLINICAL DATA:  Altered mental status. Patient became unresponsive on the toilet. EXAM: PORTABLE CHEST 1 VIEW COMPARISON:  Radiographs 05/09/2024 and 04/22/2024.  CT 08/02/2012. FINDINGS: 0948 hours. Patient is rotated to the left. The heart size and mediastinal contours are stable with aortic atherosclerosis. There are surgical clips at the thoracic inlet. Patchy left basilar opacities unchanged from the recent prior study. The right lung remains clear. No evidence of pneumothorax or significant pleural effusion. No acute osseous findings status post bilateral shoulder arthroplasty. IMPRESSION: Stable examination with mild residual left basilar atelectasis or infiltrate. No new findings. Electronically Signed   By: Elmon Hagedorn M.D.   On: 05/13/2024 10:07   CT HEAD WO CONTRAST Result Date: 05/13/2024 CLINICAL DATA:  Mental status change, unknown cause. EXAM: CT HEAD WITHOUT CONTRAST TECHNIQUE: Contiguous axial images were obtained from the base of the skull through the vertex without intravenous contrast. RADIATION DOSE REDUCTION: This exam was performed according to the departmental dose-optimization program which includes automated exposure control, adjustment of the mA and/or kV according to patient size and/or use of iterative reconstruction technique.  COMPARISON:  CT head without contrast 05/09/2024. MR head without contrast 05/09/2024. FINDINGS: Brain: No acute infarct, hemorrhage, or mass lesion is present. Remote lacunar infarct is again noted in the posterior right lentiform nucleus. Chronic ischemic changes are again noted in the thalami bilaterally. Moderate generalized atrophy and diffuse white matter disease is stable. The ventricles are proportionate to the degree of atrophy. No significant extraaxial fluid collection is present. The brainstem and cerebellum are within normal limits. Midline structures are within normal limits. Vascular: Atherosclerotic calcifications are present within the cavernous internal carotid arteries bilaterally. No hyperdense vessel is present. Skull: Calvarium is intact. No focal lytic or blastic lesions are present. No significant extracranial soft tissue lesion is present. Sinuses/Orbits: The paranasal sinuses and mastoid air cells are clear. Bilateral lens replacements are noted. Globes and orbits are otherwise unremarkable. IMPRESSION: 1. No acute intracranial abnormality or significant interval change. 2. Stable remote lacunar infarct of the posterior right lentiform nucleus. 3. Stable moderate generalized atrophy and diffuse white matter disease. This likely reflects the sequela of chronic microvascular ischemia. Electronically Signed   By: Audree Leas M.D.   On: 05/13/2024 09:02   ECHOCARDIOGRAM COMPLETE Result Date: 05/10/2024    ECHOCARDIOGRAM REPORT   Patient Name:   Christine Leblanc Date of Exam: 05/10/2024 Medical Rec #:  [id number]      Height:       61.0 in Accession #:    [id number]     Weight:       116.8 lb Date of Birth:  1929/03/13      BSA:          1.503 m Patient Age:    89 years       BP:           151/93 mmHg Patient Gender: F              HR:           104 bpm. Exam Location:  [facility] Procedure: 2D Echo, Cardiac Doppler and Color Doppler (Both Spectral and Color            Flow Doppler were  utilized during procedure). Indications:    Stroke l63.9  History:        Patient has prior history of Echocardiogram examinations, most                 recent 01/07/2024. TIA and COPD, Signs/Symptoms:Syncope; Risk  Factors:Hypertension and Dyslipidemia.  Sonographer:    Denese Finn RCS Referring Phys: [phone] CARLOS MADERA IMPRESSIONS  1. Left ventricular ejection fraction, by estimation, is 70 to 75%. The left ventricle has hyperdynamic function. The left ventricle has no regional wall motion abnormalities. Left ventricular diastolic parameters are consistent with Grade I diastolic dysfunction (impaired relaxation).  2. Right ventricular systolic function is hyperdynamic. The right ventricular size is normal.  3. Left atrial size was mildly dilated.  4. The mitral valve is grossly normal. Trivial mitral valve regurgitation. No evidence of mitral stenosis.  5. The aortic valve was not well visualized. Aortic valve regurgitation is not visualized. No aortic stenosis is present. Comparison(s): Prior images reviewed side by side. Function is more vigorous from prior. FINDINGS  Left Ventricle: No strain or 3D transmitted. Left ventricular ejection fraction, by estimation, is 70 to 75%. The left ventricle has hyperdynamic function. The left ventricle has no regional wall motion abnormalities. Strain was performed and the global  longitudinal strain is indeterminate. The left ventricular internal cavity size was normal in size. Suboptimal image quality limits for assessment of left ventricular hypertrophy. Left ventricular diastolic parameters are consistent with Grade I diastolic dysfunction (impaired relaxation). Right Ventricle: The right ventricular size is normal. No increase in right ventricular wall thickness. Right ventricular systolic function is hyperdynamic. Left Atrium: Left atrial size was mildly dilated. Right Atrium: Right atrial size was normal in size. Prominent Eustachian valve. Pericardium:  There is no evidence of pericardial effusion. Presence of epicardial fat layer. Mitral Valve: The mitral valve is grossly normal. Trivial mitral valve regurgitation. No evidence of mitral valve stenosis. Tricuspid Valve: The tricuspid valve is normal in structure. Tricuspid valve regurgitation is not demonstrated. No evidence of tricuspid stenosis. Aortic Valve: The aortic valve was not well visualized. Aortic valve regurgitation is not visualized. No aortic stenosis is present. Pulmonic Valve: The pulmonic valve was normal in structure. Pulmonic valve regurgitation is not visualized. No evidence of pulmonic stenosis. Aorta: The aortic root is normal in size and structure and the ascending aorta was not well visualized. IAS/Shunts: The atrial septum is grossly normal. Additional Comments: 3D was performed not requiring image post processing on an independent workstation and was indeterminate.  LEFT VENTRICLE PLAX 2D LVIDd:         3.60 cm   Diastology LVIDs:         2.00 cm   LV e' medial:    7.18 cm/s LV PW:         1.30 cm   LV E/e' medial:  12.2 LV IVS:        1.30 cm   LV e' lateral:   6.53 cm/s LVOT diam:     1.70 cm   LV E/e' lateral: 13.4 LV SV:         39 LV SV Index:   26 LVOT Area:     2.27 cm  RIGHT VENTRICLE RV S prime:     15.40 cm/s TAPSE (M-mode): 2.1 cm LEFT ATRIUM             Index        RIGHT ATRIUM          Index LA diam:        3.00 cm 2.00 cm/m   RA Area:     9.70 cm LA Vol (A2C):   46.3 ml 30.80 ml/m  RA Volume:   17.20 ml 11.44 ml/m LA Vol (A4C):   47.8 ml 31.80 ml/m  LA Biplane Vol: 49.5 ml 32.93 ml/m  AORTIC VALVE LVOT Vmax:   93.80 cm/s LVOT Vmean:  60.400 cm/s LVOT VTI:    0.171 m  AORTA Ao Root diam: 3.10 cm MITRAL VALVE MV Area (PHT): 4.15 cm     SHUNTS MV Decel Time: 183 msec     Systemic VTI:  0.17 m MR Peak grad: 147.1 mmHg    Systemic Diam: 1.70 cm MR Mean grad: 81.0 mmHg MR Vmax:      606.50 cm/s MR Vmean:     399.5 cm/s MV E velocity: 87.30 cm/s MV A velocity: 116.00 cm/s  MV E/A ratio:  0.75 Gloriann Larger MD Electronically signed by Gloriann Larger MD Signature Date/Time: 05/10/2024/11:19:52 AM    Final    US  Carotid Bilateral (at [facility] and [facility] only) Result Date: 05/10/2024 CLINICAL DATA:  Neurologic deficit, hypertension, hyperlipidemia and syncope. EXAM: BILATERAL CAROTID DUPLEX ULTRASOUND TECHNIQUE: Martina Sledge scale imaging, color Doppler and duplex ultrasound were performed of bilateral carotid and vertebral arteries in the neck. COMPARISON:  Prior study on 01/07/2024 FINDINGS: Criteria: Quantification of carotid stenosis is based on velocity parameters that correlate the residual internal carotid diameter with NASCET-based stenosis levels, using the diameter of the distal internal carotid lumen as the denominator for stenosis measurement. The following velocity measurements were obtained: RIGHT ICA:  78/14 cm/sec CCA:  66/10 cm/sec SYSTOLIC ICA/CCA RATIO:  1.6 ECA:  91 cm/sec LEFT ICA:  79/16 cm/sec CCA:  61/9 cm/sec SYSTOLIC ICA/CCA RATIO:  1.3 ECA:  105 cm/sec RIGHT CAROTID ARTERY: Similar appearance of calcified and noncalcified plaque at the level of the carotid bulb and proximal right ICA. Estimated right ICA stenosis is less than 50%. RIGHT VERTEBRAL ARTERY: Antegrade flow with normal waveform and velocity. LEFT CAROTID ARTERY: Similar appearance of mild calcified plaque at the level of the left carotid bulb and proximal left ICA. Estimated left ICA stenosis is less than 50%. LEFT VERTEBRAL ARTERY: Antegrade flow with normal waveform and velocity. IMPRESSION: Stable appearance of bilateral carotid bulb and proximal ICA plaque, right greater than left. Estimated bilateral ICA stenoses are less than 50%. Electronically Signed   By: Erica Hau M.D.   On: 05/10/2024 10:54   MR BRAIN WO CONTRAST Result Date: 05/09/2024 CLINICAL DATA:  Initial evaluation for acute neuro deficit, stroke suspected. EXAM: MRI HEAD WITHOUT CONTRAST TECHNIQUE: Multiplanar, multiecho  pulse sequences of the brain and surrounding structures were obtained without intravenous contrast. COMPARISON:  CT from earlier the same day. FINDINGS: Brain: Examination is limited as the patient was unable to tolerate the full length of the exam. Axial and coronal DWI images only were performed. Diffusion-weighted imaging demonstrates no abnormal foci of restricted diffusion to suggest acute or subacute ischemia. No visible areas of chronic cortical infarction. No visible mass lesion, midline shift, or mass effect. No hydrocephalus. No visible extra-axial fluid collection. Underlying age-related cerebral atrophy noted. Vascular: Not assessed on this limited exam. Skull and upper cervical spine: Not assessed on this limited exam. Sinuses/Orbits: Not assessed on this limited exam. Other: None. IMPRESSION: 1. Limited exam with only axial and coronal DWI sequences performed. 2. No evidence for acute or subacute ischemia. Electronically Signed   By: Virgia Griffins M.D.   On: 05/09/2024 18:06   DG Chest Port 1 View Result Date: 05/09/2024 CLINICAL DATA:  Shortness of breath. EXAM: PORTABLE CHEST 1 VIEW COMPARISON:  04/22/2024 FINDINGS: New patchy opacity at the left lung base, possible small left pleural effusion. Stable heart size and mediastinal contours.  Aortic atherosclerosis. No pulmonary edema or pneumothorax. Bilateral humeral arthroplasties. IMPRESSION: New patchy opacity at the left lung base, possible small left pleural effusion. Findings are suspicious for pneumonia. Electronically Signed   By: Chadwick Colonel M.D.   On: 05/09/2024 16:34   CT HEAD CODE STROKE WO CONTRAST Result Date: 05/09/2024 CLINICAL DATA:  Code stroke.  Left-sided weakness EXAM: CT HEAD WITHOUT CONTRAST TECHNIQUE: Contiguous axial images were obtained from the base of the skull through the vertex without intravenous contrast. RADIATION DOSE REDUCTION: This exam was performed according to the departmental dose-optimization  program which includes automated exposure control, adjustment of the mA and/or kV according to patient size and/or use of iterative reconstruction technique. COMPARISON:  Five days prior FINDINGS: Brain: No evidence of acute infarction, hemorrhage, hydrocephalus, extra-axial collection or mass lesion/mass effect. Low-density in the cerebral white matter attributed to chronic small vessel ischemia. Chronic lacunar infarct or dilated perivascular space at the external capsule/posterior putamen on the right. Vascular: No hyperdense vessel or unexpected calcification. Skull: Normal. Negative for fracture or focal lesion. Sinuses/Orbits: No acute finding. Other: Intermittent motion artifact. ASPECTS [facility] Stroke Program Early CT Score) - Ganglionic level infarction (caudate, lentiform nuclei, internal capsule, insula, M1-M3 cortex): 7 - Supraganglionic infarction (M4-M6 cortex): 3 Total score (0-10 with 10 being normal): 10 IMPRESSION: 1. Aging brain without acute or interval finding. 2. Intermittent motion artifact. Electronically Signed   By: Ronnette Coke M.D.   On: 05/09/2024 12:43   CT HEAD WO CONTRAST ( ) Result Date: 05/04/2024 CLINICAL DATA:  Headache, increasing frequency or severity. Blood pressure 199/88. EXAM: CT HEAD WITHOUT CONTRAST TECHNIQUE: Contiguous axial images were obtained from the base of the skull through the vertex without intravenous contrast. RADIATION DOSE REDUCTION: This exam was performed according to the departmental dose-optimization program which includes automated exposure control, adjustment of the mA and/or kV according to patient size and/or use of iterative reconstruction technique. COMPARISON:  CT head 04/22/2024 FINDINGS: Brain: No intracranial hemorrhage, mass effect, or evidence of acute infarct. No hydrocephalus. No extra-axial fluid collection. Advanced cerebral atrophy and chronic small vessel ischemic disease. Chronic right basal ganglia infarct. Vascular: No  hyperdense vessel. Intracranial arterial calcification. Skull: No fracture or focal lesion. Sinuses/Orbits: No acute finding. Other: None. IMPRESSION: 1. No acute intracranial abnormality. Electronically Signed   By: Rozell Cornet M.D.   On: 05/04/2024 23:41   CT Head Wo Contrast Result Date: 04/22/2024 CLINICAL DATA:  Altered mental status EXAM: CT HEAD WITHOUT CONTRAST TECHNIQUE: Contiguous axial images were obtained from the base of the skull through the vertex without intravenous contrast. RADIATION DOSE REDUCTION: This exam was performed according to the departmental dose-optimization program which includes automated exposure control, adjustment of the mA and/or kV according to patient size and/or use of iterative reconstruction technique. COMPARISON:  03/04/2024 FINDINGS: Brain: There is no mass, hemorrhage or extra-axial collection. There is generalized atrophy without lobar predilection. Hypodensity of the white matter is most commonly associated with chronic microvascular disease. Old right basal ganglia small vessel infarct. Vascular: Atherosclerotic calcification of the internal carotid arteries at the skull base. No abnormal hyperdensity of the major intracranial arteries or dural venous sinuses. Skull: The visualized skull base, calvarium and extracranial soft tissues are normal. Sinuses/Orbits: No fluid levels or advanced mucosal thickening of the visualized paranasal sinuses. No mastoid or middle ear effusion. Normal orbits. Other: None. IMPRESSION: 1. No acute intracranial abnormality. 2. Generalized atrophy and findings of chronic microvascular disease. 3. Old right basal ganglia small vessel infarct.  Electronically Signed   By: Juanetta Nordmann M.D.   On: 04/22/2024 02:17   DG Chest 2 View Result Date: 04/22/2024 CLINICAL DATA:  Altered mental status and elevated blood pressure. EXAM: CHEST - 2 VIEW COMPARISON:  None Available. FINDINGS: The heart size and mediastinal contours are within  normal limits. Multiple surgical clips are seen within the superior mediastinum. There is marked severity calcification of the aortic arch. Mild, diffuse, chronic appearing increased interstitial lung markings are noted. Adjacent ill-defined 4 mm nodular opacities are seen overlying the mid right lung. These are not visualized on the prior study. There is no evidence of focal consolidation, pleural effusion or pneumothorax. There is evidence of prior bilateral shoulder arthroplasty. Multilevel degenerative changes are present throughout the thoracic spine. The visualized skeletal structures are unremarkable. IMPRESSION: Stable chronic and postoperative changes without evidence of acute cardiopulmonary disease. Electronically Signed   By: Virgle Grime M.D.   On: 04/22/2024 02:08        Final Assessment and Plan:   Patient likely encephalopathic in the setting of persistent urinary tract infection and acute kidney injury.  Dehydrated as well, status post 1 L LR fluid bolus for volume resuscitation, administered IV Rocephin  for treatment of UTI, cultures pending.  Patient CT showed evidence of potential adnexal lesion requiring further diagnostic evaluation by MRI imaging.  Patient not septic, altered in the setting of UTI, afebrile, not tachycardic, hemodynamically stable, stable for floor admission, updated family regarding the plan of care.  Medicine consulted for admission, Dr. Consuela Denier accepting.  Of note, patient's D-dimer resulted significantly elevated at 7.11, have been delayed on labs, discussed with inpatient medicine, will plan for VQ scan inpatient.  Family updated regarding multiple findings on diagnostics and ultimate plan of care.  Patient stable at time of admission.  Final Clinical Impression(s) / ED Diagnoses Final diagnoses:  Altered mental status, unspecified altered mental status type  AKI (acute kidney injury) (HCC)  Acute cystitis with hematuria  Hydronephrosis, unspecified  hydronephrosis type  Adnexal mass  Syncope, unspecified syncope type  Positive D dimer    Rx / DC Orders ED Discharge Orders     None         Rosealee Concha, MD 05/13/24 1414

## 2024-05-13 NOTE — ED Triage Notes (Signed)
 Pt bib ems from home. Pt's son woke her up this morning, like normal, set pt on toilet where she went unresponsive and called 911. PMH: TIA, UTI

## 2024-05-13 NOTE — ED Notes (Addendum)
 Transport to take patient to 5W29C, Marrie Sizer person icon for IP handoff completed on ED board.

## 2024-05-13 NOTE — ED Notes (Signed)
 Patient reports headache, Unable to answer 0-10 pain score when asked. Patient daughter at bedside.

## 2024-05-13 NOTE — ED Notes (Signed)
 Bladder scan showed 20ml's.

## 2024-05-13 NOTE — Hospital Course (Addendum)
 ECHO 5/11 with LVEF 70-75%, hyperdynamic function, no RWMA, G1DD, mildly dilated LA.  Bilateral carotid ultrasound 5/11 with stable appearance of bilateral carotid bulb and proximal ICA plaque (R > L). Estimated bilateral ICA stenoses <50%.

## 2024-05-14 ENCOUNTER — Inpatient Hospital Stay (HOSPITAL_COMMUNITY)

## 2024-05-14 ENCOUNTER — Inpatient Hospital Stay (HOSPITAL_COMMUNITY): Admitting: Registered Nurse

## 2024-05-14 ENCOUNTER — Encounter (HOSPITAL_COMMUNITY)
Admission: EM | Disposition: A | Discharge status: Hospice | Payer: Self-pay | Source: Home / Self Care | Attending: Internal Medicine

## 2024-05-14 ENCOUNTER — Encounter (HOSPITAL_COMMUNITY): Payer: Self-pay | Admitting: Student

## 2024-05-14 DIAGNOSIS — E43 Unspecified severe protein-calorie malnutrition: Secondary | ICD-10-CM | POA: Insufficient documentation

## 2024-05-14 DIAGNOSIS — Z87891 Personal history of nicotine dependence: Secondary | ICD-10-CM | POA: Diagnosis not present

## 2024-05-14 DIAGNOSIS — I10 Essential (primary) hypertension: Secondary | ICD-10-CM

## 2024-05-14 DIAGNOSIS — M7989 Other specified soft tissue disorders: Secondary | ICD-10-CM | POA: Diagnosis not present

## 2024-05-14 DIAGNOSIS — N905 Atrophy of vulva: Secondary | ICD-10-CM

## 2024-05-14 DIAGNOSIS — N179 Acute kidney failure, unspecified: Secondary | ICD-10-CM | POA: Diagnosis not present

## 2024-05-14 DIAGNOSIS — N1330 Unspecified hydronephrosis: Secondary | ICD-10-CM

## 2024-05-14 DIAGNOSIS — N3001 Acute cystitis with hematuria: Secondary | ICD-10-CM | POA: Diagnosis not present

## 2024-05-14 DIAGNOSIS — N9489 Other specified conditions associated with female genital organs and menstrual cycle: Secondary | ICD-10-CM | POA: Diagnosis not present

## 2024-05-14 HISTORY — PX: CYSTOSCOPY W/ URETERAL STENT PLACEMENT: SHX1429

## 2024-05-14 LAB — BLOOD CULTURE ID PANEL (REFLEXED) - BCID2

## 2024-05-14 LAB — CBC
HCT: 26.4 % — ABNORMAL LOW (ref 36.0–46.0)
Hemoglobin: 8.6 g/dL — ABNORMAL LOW (ref 12.0–15.0)
MCH: 28.7 pg (ref 26.0–34.0)
MCHC: 32.6 g/dL (ref 30.0–36.0)
MCV: 88 fL (ref 80.0–100.0)
Platelets: 79 10*3/uL — ABNORMAL LOW (ref 150–400)
RBC: 3 MIL/uL — ABNORMAL LOW (ref 3.87–5.11)
RDW: 16.9 % — ABNORMAL HIGH (ref 11.5–15.5)
WBC: 17.3 10*3/uL — ABNORMAL HIGH (ref 4.0–10.5)
nRBC: 0 % (ref 0.0–0.2)

## 2024-05-14 LAB — BASIC METABOLIC PANEL WITH GFR
Anion gap: 10 (ref 5–15)
BUN: 55 mg/dL — ABNORMAL HIGH (ref 8–23)
CO2: 20 mmol/L — ABNORMAL LOW (ref 22–32)
Calcium: 7.8 mg/dL — ABNORMAL LOW (ref 8.9–10.3)
Chloride: 101 mmol/L (ref 98–111)
Creatinine, Ser: 3.03 mg/dL — ABNORMAL HIGH (ref 0.44–1.00)
GFR, Estimated: 14 mL/min — ABNORMAL LOW (ref >60)
Glucose, Bld: 87 mg/dL (ref 70–99)
Potassium: 3.6 mmol/L (ref 3.5–5.1)
Sodium: 131 mmol/L — ABNORMAL LOW (ref 135–145)

## 2024-05-14 SURGERY — CYSTOSCOPY, WITH RETROGRADE PYELOGRAM AND URETERAL STENT INSERTION
Anesthesia: General | Site: Ureter | Laterality: Right

## 2024-05-14 MED ORDER — LIDOCAINE 2% (20 MG/ML) 5 ML SYRINGE
INTRAMUSCULAR | Status: DC | PRN
  Administered 2024-05-14: 40 mg via INTRAVENOUS

## 2024-05-14 MED ORDER — BOOST / RESOURCE BREEZE PO LIQD CUSTOM
1.0000 | Freq: Three times a day (TID) | ORAL | Status: DC
  Administered 2024-05-15 – 2024-05-17 (×4): 1 via ORAL

## 2024-05-14 MED ORDER — SODIUM CHLORIDE 0.9 % IV SOLN: INTRAVENOUS | Status: DC

## 2024-05-14 MED ORDER — FENTANYL CITRATE (PF) 100 MCG/2ML IJ SOLN
25.0000 ug | INTRAMUSCULAR | Status: DC | PRN
Start: 2024-05-14 — End: 2024-05-14

## 2024-05-14 MED ORDER — ONDANSETRON HCL 4 MG/2ML IJ SOLN
INTRAMUSCULAR | Status: DC | PRN
  Administered 2024-05-14: 4 mg via INTRAVENOUS

## 2024-05-14 MED ORDER — IOHEXOL 300 MG/ML  SOLN
INTRAMUSCULAR | Status: DC | PRN
  Administered 2024-05-14: 14 mL via URETHRAL

## 2024-05-14 MED ORDER — ADULT MULTIVITAMIN W/MINERALS CH
1.0000 | ORAL_TABLET | Freq: Every day | ORAL | Status: DC
  Administered 2024-05-17 – 2024-05-18 (×2): 1 via ORAL
  Filled 2024-05-14 (×4): qty 1

## 2024-05-14 MED ORDER — SUCCINYLCHOLINE CHLORIDE 200 MG/10ML IV SOSY
PREFILLED_SYRINGE | INTRAVENOUS | Status: DC | PRN
  Administered 2024-05-14: 80 mg via INTRAVENOUS

## 2024-05-14 MED ORDER — CHLORHEXIDINE GLUCONATE 0.12 % MT SOLN
OROMUCOSAL | Status: AC
  Administered 2024-05-14: 15 mL via OROMUCOSAL
  Filled 2024-05-14: qty 15

## 2024-05-14 MED ORDER — STERILE WATER FOR IRRIGATION IR SOLN
Status: DC | PRN
  Administered 2024-05-14: 1000 mL

## 2024-05-14 MED ORDER — STERILE WATER FOR IRRIGATION IR SOLN
Status: DC | PRN
  Administered 2024-05-14: 3000 mL

## 2024-05-14 MED ORDER — PROPOFOL 10 MG/ML IV BOLUS
INTRAVENOUS | Status: DC | PRN
  Administered 2024-05-14: 30 mg via INTRAVENOUS
  Administered 2024-05-14: 60 mg via INTRAVENOUS

## 2024-05-14 MED ORDER — PHENYLEPHRINE 80 MCG/ML (10ML) SYRINGE FOR IV PUSH (FOR BLOOD PRESSURE SUPPORT)
PREFILLED_SYRINGE | INTRAVENOUS | Status: DC | PRN
  Administered 2024-05-14 (×3): 80 ug via INTRAVENOUS

## 2024-05-14 MED ORDER — CHLORHEXIDINE GLUCONATE 0.12 % MT SOLN: 15.0000 mL | Freq: Once | OROMUCOSAL | Status: AC

## 2024-05-14 MED ORDER — DEXAMETHASONE SODIUM PHOSPHATE 10 MG/ML IJ SOLN
INTRAMUSCULAR | Status: DC | PRN
  Administered 2024-05-14: 5 mg via INTRAVENOUS

## 2024-05-14 MED ORDER — FENTANYL CITRATE (PF) 100 MCG/2ML IJ SOLN
INTRAMUSCULAR | Status: AC
  Filled 2024-05-14: qty 2

## 2024-05-14 MED ORDER — SODIUM CHLORIDE 0.9 % IV SOLN
500.0000 mg | Freq: Two times a day (BID) | INTRAVENOUS | Status: DC
  Administered 2024-05-14 – 2024-05-18 (×9): 500 mg via INTRAVENOUS
  Filled 2024-05-14 (×11): qty 10

## 2024-05-14 MED ORDER — LIDOCAINE HCL URETHRAL/MUCOSAL 2 % EX GEL
CUTANEOUS | Status: AC
  Filled 2024-05-14: qty 11

## 2024-05-14 MED ORDER — FENTANYL CITRATE (PF) 250 MCG/5ML IJ SOLN
INTRAMUSCULAR | Status: DC | PRN
  Administered 2024-05-14 (×3): 25 ug via INTRAVENOUS

## 2024-05-14 MED ORDER — ORAL CARE MOUTH RINSE: 15.0000 mL | Freq: Once | OROMUCOSAL | Status: AC

## 2024-05-14 SURGICAL SUPPLY — 21 items
BAG URINE DRAIN 2000ML AR STRL (UROLOGICAL SUPPLIES) ×2 IMPLANT
BAG URO CATCHER STRL LF (MISCELLANEOUS) ×2 IMPLANT
CATH FOLEY 2WAY SLVR 5CC 16FR (CATHETERS) IMPLANT
CATH URET 5FR 28IN OPEN ENDED (CATHETERS) IMPLANT
CATH URETL OPEN END 6FR 70 (CATHETERS) IMPLANT
GLOVE BIO SURGEON STRL SZ7.5 (GLOVE) ×2 IMPLANT
GOWN STRL REUS W/ TWL LRG LVL3 (GOWN DISPOSABLE) ×2 IMPLANT
GOWN STRL REUS W/ TWL XL LVL3 (GOWN DISPOSABLE) ×2 IMPLANT
GUIDEWIRE ANG ZIPWIRE 038X150 (WIRE) IMPLANT
GUIDEWIRE STR DUAL SENSOR (WIRE) ×2 IMPLANT
KIT TURNOVER KIT B (KITS) ×2 IMPLANT
MANIFOLD NEPTUNE II (INSTRUMENTS) IMPLANT
NS IRRIG 1000ML POUR BTL (IV SOLUTION) ×2 IMPLANT
PACK CYSTO (CUSTOM PROCEDURE TRAY) ×2 IMPLANT
PAD ARMBOARD POSITIONER FOAM (MISCELLANEOUS) ×4 IMPLANT
STENT URET 6FRX24 CONTOUR (STENTS) IMPLANT
STENT URET 6FRX26 CONTOUR (STENTS) IMPLANT
SYPHON OMNI JUG (MISCELLANEOUS) ×2 IMPLANT
TOWEL GREEN STERILE FF (TOWEL DISPOSABLE) ×2 IMPLANT
TUBE CONNECTING 12X1/4 (SUCTIONS) IMPLANT
UNDERPAD 30X36 HEAVY ABSORB (UNDERPADS AND DIAPERS) ×2 IMPLANT

## 2024-05-14 NOTE — Progress Notes (Addendum)
 Patient brought back to [facility]W from [facility]. Delay in monitoring due to monitor block missing. Pacu located monitor and brought it up. Patient back on tele now. NSR at 69. Monitor verified by Mathias Solon, [place].

## 2024-05-14 NOTE — Plan of Care (Signed)
 Pt has rested quietly throughout the night with no distress noted. Alert and oriented to person and place. On room air. SR on the  monitor. Purewick intact to suction. Son at bedside. No complaints voiced.     Problem: Clinical Measurements: Goal: Ability to maintain clinical measurements within normal limits will improve Outcome: Progressing Goal: Respiratory complications will improve Outcome: Progressing Goal: Cardiovascular complication will be avoided Outcome: Progressing   Problem: Pain Managment: Goal: General experience of comfort will improve and/or be controlled Outcome: Progressing

## 2024-05-14 NOTE — Progress Notes (Signed)
 OT Cancellation Note  Patient Details Name: SABRIEL GUADIAN MRN: [id number] DOB: [birth year]   Cancelled Treatment:    Reason Eval/Treat Not Completed: Patient not medically ready. D-dimer elevated, noted order for doppler to r/u DVT. Will monitor for results and follow-up for formal evaluation when appropriate.   Fonda Hymen [facility] 05/14/2024, 11:42 AM

## 2024-05-14 NOTE — Op Note (Signed)
 Preoperative diagnosis: Right hydronephrosis, atrophic left kidney, acute kidney injury Postoperative diagnosis: Same  Procedure: Cystoscopy with right retrograde pyelogram and right ureteral stent placement  Surgeon: Derrick Fling  Anesthesia: General  Indication for procedure: Christine Leblanc is a 89 year old female with a rising creatinine.  CT scan revealed right hydroureteronephrosis down to a right adnexal mass.  Also her left kidney was smaller and likely had less function than the right thus exacerbating her acute kidney injury.  Findings: On exam the vulva had severe atrophy, no lesions.  She may have had a colpocleisis.  The meatus appeared normal.  On cystoscopy the bladder and the urethra were unremarkable.  The urine was quite cloudy but after irrigating it several times the mucosa appeared normal with no stone or foreign body in the bladder.  The ureteral orifices were in the normal orthotopic position.  Right retrograde pyelogram-this outlined a single ureter single collecting system unit.  There was narrowing and medial movement of the ureter consistent with some external pressure in the mid ureter and above this hydroureteronephrosis with tortuosity and then going up into the renal pelvis with a dilated collecting system.  Description of procedure: After consent was obtained patient brought to the operating room.  After adequate anesthesia she is placed in lithotomy position and prepped and draped in the usual sterile fashion.  Timeout was performed to confirm the patient and procedure.  The cystoscope was passed per urethra and the bladder inspected.  The urine was irrigated to clear.  Right ureteral orifice was cannulated with a 5 [place] open-ended catheter and right retrograde injection of contrast was performed.  A sensor wire was then advanced up into the proximal ureter where it met some tortuosity and would not quite advance.  I then passed the open-ended catheter up above the obstruction  toward the proximal ureter and this was just enough to brace the wire to get enough into the kidney where the ureter pop straight.  I then passed the open-ended catheter into the region of the collecting system and remove the wire.  There was a good hydronephrotic drip.  Repeat injection of contrast outlining the renal pelvis and the wire was repassed and the open-ended catheter removed.  A 624 cm stent was passed.  The wire was removed with a good coil seen up in the kidney and a good coil in the bladder.  The stent appeared to be draining well.  The scope was backed out and a 16 [place] Foley catheter was placed to gravity to max drain the system overnight.  She was then awakened taken the cover room in stable condition.  Complications: None  Blood loss: Minimal  Specimens: None  Drains: 6 x 24 cm right ureteral stent, 16 French Foley catheter  Disposition: Patient stable to PACU.  I called Robert and discussed the procedure postop care and follow-up.  We did discuss preoperatively his mom needs to drink plenty water  and stay hydrated or the stent can encrust rather quickly.

## 2024-05-14 NOTE — Anesthesia Procedure Notes (Signed)
 Procedure Name: Intubation Date/Time: 05/14/2024 7:10 PM  Performed by: Gabe Jock, CRNAPre-anesthesia Checklist: Patient identified, Emergency Drugs available, Suction available and Patient being monitored Patient Re-evaluated:Patient Re-evaluated prior to induction Oxygen Delivery Method: Circle System Utilized Preoxygenation: Pre-oxygenation with 100% oxygen Induction Type: IV induction Ventilation: Mask ventilation without difficulty Laryngoscope Size: Mac and 4 Grade View: Grade I Tube type: Oral Tube size: 6.5 mm Number of attempts: 1 Airway Equipment and Method: Stylet Placement Confirmation: ETT inserted through vocal cords under direct vision, positive ETCO2 and breath sounds checked- equal and bilateral Secured at: 22 cm Tube secured with: Tape Dental Injury: Teeth and Oropharynx as per pre-operative assessment

## 2024-05-14 NOTE — Consult Note (Signed)
 Urology Consult Note   Requesting Attending Physician:  Burton Casey, MD Service Providing Consult: Urology  Consulting Attending: Dr. Derrick Fling   Reason for Consult:  right hydronephrosis and AKI  HPI: Christine Leblanc is seen in consultation for reasons noted above at the request of Ghimire, Estil Heman, MD. Patient is a 89 year old female with PMH significant for COPD, trouble, HTN, TIA, CVA, IDA, CKD IIIb, and hypothyroid presenting to [facility] emergency department with AMS and a near syncopal event.  She was recently admitted to [facility] for strokelike symptoms, notably left-sided weakness and dysarthria.  She was discharged with oral ABX and treatment of a UTI and on dual antiplatelet therapy.   On my arrival patient was alert and oriented although slow to respond and somewhat difficult to understand.  She was accompanied by her son, whom I had reviewed the case and plan with by phone already.  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  ------------------  Assessment:  89 y.o. female with right hydronephrosis and AKI   Recommendations: # Right hydronephrosis # Right adnexal mass # Atrophic left kidney # AKI  I lose the right ureter headed towards the right adnexal mass but it appears it may be obstructing.  Considering her left atrophic kidney, the obstruction of the right side is having a disproportionately large effect on her renal function.  I spoke with her son by phone and shared decision was made to proceed to the OR for cystoscopy with right retrograde pyelogram and right ureteral stent placement with Dr. Derrick Fling around 330 this afternoon.  Remain n.p.o. Last meal 10 AM   Case and plan discussed with Dr/ Derrick Fling  Past Medical History: Past Medical History:  Diagnosis Date   Anemia    Asthma    Baker's cyst    Cancer (HCC)    skin Ca- ? basal cell    Chronic back pain    Chronic neck pain    Degenerative joint disease    Left  shoulder; cervical spine, knees & hands    Gastroesophageal reflux disease    Hiatal hernia; distal esophageal web requiring dilatation; gastric polyps; gastritis; refuses colonoscopy   GERD (gastroesophageal reflux disease)    History of stress test 1990's   stress test done under the care of Dr. Macdonald Savoy & Dr. Katheryne Pane, now being followed by Dr. Amanda Jungling- in [place] , recently seen & told to f/U in one yr.    Hyperlipidemia 04/27/2011   Hypertension    Hypothyroidism    Lymphocytic thyroiditis    Mild carotid artery disease (HCC)    Multiple thyroid  nodules 2010   Adenomatous; thyroidectomy in 2010   Orthostasis    Osteoarthritis of right knee 07/27/2014   Pneumonia    hosp. for pneumonia- long time ago    Primary localized osteoarthrosis of right shoulder 06/21/2015   Seizures (HCC)    yes- as a child- & into adult years, states she took med. for them at one time, stopped at 32 yrs. of age    Syncope and collapse 2007   Possible CVA in 2012 with left lower extremity weakness; refused hospitalization; CT-Atrophy and chronic microvascular ischemic change.     Past Surgical History:  Past Surgical History:  Procedure Laterality Date   ABDOMINAL HYSTERECTOMY     fibroids   CATARACT EXTRACTION     Bilateral; redo surgery on the right for incomplete primary procedure   COLONOSCOPY  Remote   ESOPHAGOGASTRODUODENOSCOPY  11/2009  Dr. Nolene Baumgarten: probable distal web s/p dilation small hiatal hernia/gastric polyps/mild gastritis, appeared to have narrowing at the junction of D1 and D2 dilated up to 12 mm   ESOPHAGOGASTRODUODENOSCOPY N/A 03/21/2015   Surgeon: Alyce Jubilee, MD; proximal esophageal web s/p dilation, multiple gastric polyps s/p multiple biopsies, mild erosive gastritis s/p biopsy.  Pathology with fundic gland polyps, chronic gastritis, negative for H. pylori.   EYE SURGERY     PROLAPSED UTERINE FIBROID LIGATION     outcomed with rectocele & cystocele   SAVORY DILATION N/A  03/21/2015   Procedure: SAVORY DILATION;  Surgeon: Alyce Jubilee, MD;  Location: [facility]ITE;  Service: Endoscopy;  Laterality: N/A;   SHOULDER SURGERY     left   TOTAL KNEE ARTHROPLASTY Right 07/27/2014   Procedure: RIGHT TOTAL KNEE ARTHROPLASTY;  Surgeon: Neville Barbone, MD;  Location: [facility] OR;  Service: Orthopedics;  Laterality: Right;   TOTAL SHOULDER ARTHROPLASTY Right 06/21/2015   Procedure: RIGHT TOTAL SHOULDER ARTHROPLASTY;  Surgeon: Osa Blase, MD;  Location: [facility] OR;  Service: Orthopedics;  Laterality: Right;   TOTAL THYROIDECTOMY  2010    Medication: Current Facility-Administered Medications  Medication Dose Route Frequency Provider Last Rate Last Admin   acetaminophen  (TYLENOL ) tablet 650 mg  650 mg Oral Q6H PRN Jinwala, Sagar H, MD   650 mg at 05/13/24 1458   Or   acetaminophen  (TYLENOL ) suppository 650 mg  650 mg Rectal Q6H PRN Jinwala, Sagar H, MD       albuterol  (VENTOLIN  HFA) 108 (90 Base) MCG/ACT inhaler 1-2 puff  1-2 puff Inhalation Q4H PRN Jinwala, Sagar H, MD       aspirin  EC tablet 81 mg  81 mg Oral Daily Jinwala, Sagar H, MD   81 mg at 05/14/24 6063   clopidogrel  (PLAVIX ) tablet 75 mg  75 mg Oral Daily Jinwala, Sagar H, MD   75 mg at 05/14/24 0160   lactated ringers  infusion   Intravenous Continuous Jinwala, Sagar H, MD 100 mL/hr at 05/13/24 1658 New Bag at 05/13/24 1658   levothyroxine  (SYNTHROID ) tablet 88 mcg  88 mcg Oral Once per day on Monday Tuesday Wednesday Thursday Friday Saturday Reome, Earle J, RPH   88 mcg at 05/14/24 0606   And   [START ON 05/17/2024] levothyroxine  (SYNTHROID ) tablet 44 mcg  44 mcg Oral Every Sunday Reome, Earle J, RPH       meropenem (MERREM) 500 mg in sodium chloride  0.9 % 100 mL IVPB  500 mg Intravenous Q12H Ghimire, Estil Heman, MD       pantoprazole  (PROTONIX ) EC tablet 40 mg  40 mg Oral BID AC Jinwala, Sagar H, MD   40 mg at 05/14/24 1093   pravastatin  (PRAVACHOL ) tablet 40 mg  40 mg Oral Daily Jinwala, Sagar H, MD   40 mg at  05/14/24 2355    Allergies: Allergies  Allergen Reactions   Celecoxib Shortness Of Breath   Dexlansoprazole  Anaphylaxis and Other (See Comments)    abd pain   Famotidine Other (See Comments)    Makes her feel "very bad"    Social History: Social History   Tobacco Use   Smoking status: Former    Current packs/day: 0.00    Average packs/day: 0.8 packs/day for 20.0 years (16.0 ttl pk-yrs)    Types: Cigarettes    Start date: 12/31/1958    Quit date: 12/31/1978    Years since quitting: 45.4   Smokeless tobacco: Never  Vaping Use   Vaping status: Never  Used  Substance Use Topics   Alcohol use: Not Currently    Comment: rarely   Drug use: No    Family History Family History  Problem Relation Age of Onset   Heart disease Mother    Gallbladder disease Mother    Heart failure Mother    Diabetes Mother    Breast cancer Sister        30 years ago   Diabetes Sister    Heart attack Father    Diabetes Maternal Grandmother    Colon cancer Neg Hx    Stroke Neg Hx     Review of Systems  Unable to perform ROS: Other  Neurological:  Positive for weakness.     Objective   Vital signs in last 24 hours: BP (!) 143/62 (BP Location: Left Arm)   Pulse 72   Temp (!) 97.5 F (36.4 C) (Oral)   Resp 16   Ht 5\' 1"  (1.549 m)   Wt 53 kg   SpO2 97%   BMI 22.08 kg/m   Physical Exam General: A&O, resting, appropriate HEENT: [place]/AT Pulmonary: Normal work of breathing Cardiovascular: no cyanosis Abdomen: Soft, NTTP, nondistended Neuro: Appropriate, no focal neurological deficits  Most Recent Labs: Lab Results  Component Value Date   WBC 17.3 (H) 05/14/2024   HGB 8.6 (L) 05/14/2024   HCT 26.4 (L) 05/14/2024   PLT 79 (L) 05/14/2024    Lab Results  Component Value Date   NA 131 (L) 05/14/2024   K 3.6 05/14/2024   CL 101 05/14/2024   CO2 20 (L) 05/14/2024   BUN 55 (H) 05/14/2024   CREATININE 3.03 (H) 05/14/2024   CALCIUM  7.8 (L) 05/14/2024   MG 1.7 03/09/2024   PHOS  2.6 03/09/2024    Lab Results  Component Value Date   INR 1.4 (H) 05/09/2024   APTT 37 (H) 05/09/2024     Urine Culture: @[id number] (laburin,org,[id number])@   IMAGING: US  RENAL Result Date: 05/13/2024 CLINICAL DATA:  Acute kidney injury EXAM: RENAL / URINARY TRACT ULTRASOUND COMPLETE COMPARISON:  CT scan 05/13/2024 FINDINGS: Right Kidney: Renal measurements: 11.3 by 5.9 by 5.1 cm = volume: 170 a mL. Echogenicity within normal limits. Mild to moderate right hydronephrosis. Left Kidney: Renal measurements: 7.4 by 3.6 by 3.8 cm = volume: 53 mL. Cortical thinning in the left kidney with mild prominence of fatty tissues in renal hilum probably due to thinning of parenchymal tissue. Likely areas of scarring in the left upper to mid kidney for example on image 24 series 1. Moderately hyperechoic left renal parenchyma. No mass or hydronephrosis visualized. Bladder: Bladder not visualized, polyp possibly empty or obscured by bowel. Other: On CT earlier today there is a question of edema tracking along the lesser curvature of the stomach raising the possibility of inflammation, as well as some suspected right gastric adenopathy along the gastrohepatic ligament; these areas are not specifically observed on today's renal ultrasound. Moreover, the right distal ureter did not appear dilated on the earlier CT, with the transition from dilated to nondilated right ureter in the vicinity of the iliac vessel cross over and right ovarian mass. IMPRESSION: 1. Mild to moderate right hydronephrosis. Although the cause is not seen directly on today's renal ultrasound, the transition from dilated to nondilated ureter appear to be in the vicinity of the iliac vessel cross over and right ovarian mass on today's CT. 2. Cortical thinning and volume loss in the left kidney with areas of scarring in the left upper to mid kidney.  Moderately hyperechoic left renal parenchyma. 3. On CT earlier today there is a question of edema  tracking along the lesser curvature of the stomach raising the possibility of inflammation, as well as some suspected right gastric adenopathy along the gastrohepatic ligament; these areas are not specifically observed on today's renal ultrasound. Electronically Signed   By: Freida Jes M.D.   On: 05/13/2024 14:53   CT CHEST ABDOMEN PELVIS WO CONTRAST Result Date: 05/13/2024 CLINICAL DATA:  AMS, cough, abdominal pain EXAM: CT CHEST, ABDOMEN AND PELVIS WITHOUT CONTRAST TECHNIQUE: Multidetector CT imaging of the chest, abdomen and pelvis was performed following the standard protocol without IV contrast. RADIATION DOSE REDUCTION: This exam was performed according to the departmental dose-optimization program which includes automated exposure control, adjustment of the mA and/or kV according to patient size and/or use of iterative reconstruction technique. COMPARISON:  CT scan chest, abdomen and pelvis from 09/30/2018. FINDINGS: CT CHEST FINDINGS Cardiovascular: Normal cardiac size. No pericardial effusion. No aortic aneurysm. There are coronary artery calcifications, in keeping with coronary artery disease. There are also moderate to severe peripheral atherosclerotic vascular calcifications of thoracic aorta and its major branches. Mediastinum/Nodes: Visualized thyroid  gland appears grossly unremarkable. No solid / cystic mediastinal masses. The esophagus is nondistended precluding optimal assessment. No mediastinal or axillary lymphadenopathy by size criteria. Evaluation of bilateral hila is limited due to lack on intravenous contrast: however, no large hilar lymphadenopathy identified. Lungs/Pleura: The central tracheo-bronchial tree is patent. Redemonstration of biapical pleuroparenchymal disease, which appears slightly more prominent than the prior exam. For example, a peripheral solid nodule along the right lung apex laterally previously measured 1.3 x 1.9 cm, currently 1.5 x 2.2 cm. There are new  bilateral trace pleural effusions with associated atelectatic changes in the bilateral lower lobes. There are several, sub 4 mm, calcified and noncalcified nodules throughout bilateral lungs (marked with electronic arrow sign on series 100). Musculoskeletal: The visualized soft tissues of the chest wall are grossly unremarkable. No suspicious osseous lesions. There are mild to moderate multilevel degenerative changes in the visualized spine. 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 Note is made of bilateral shoulder arthroplasty. CT ABDOMEN PELVIS FINDINGS Hepatobiliary: The liver is normal in size. There is subtle liver surface irregularity/nodularity, favoring cirrhosis. No suspicious mass. No intrahepatic or extrahepatic bile duct dilation. No calcified gallstones. Normal gallbladder wall thickness. No pericholecystic inflammatory changes. Pancreas: Unremarkable. No pancreatic ductal dilatation or surrounding inflammatory changes. Spleen: Within normal limits. No focal lesion. Adrenals/Urinary Tract: Adrenal glands are unremarkable. No suspicious renal mass within the limitations of this unenhanced exam. No nephroureterolithiasis on either side. Note is made of mild-to-moderate right hydronephrosis and upper hydroureter. Rest of the right ureter is not well visualized due to lack of intravenous contrast. No left hydronephrosis. Urinary bladder is under distended, precluding optimal assessment. However, no large mass or stones identified. No perivesical fat stranding. Stomach/Bowel: No disproportionate dilation of the small or large bowel loops. No evidence of abnormal bowel wall thickening or inflammatory changes. The appendix was not visualized; however there is no acute inflammatory process in the right lower quadrant. Vascular/Lymphatic: There is trace amount of free fluid in the dependent pelvis. No walled-off abscess or loculated collection. No pneumoperitoneum. No abdominal or pelvic  lymphadenopathy, by size criteria. No aneurysmal dilation of the major abdominal arteries. There are moderate peripheral atherosclerotic vascular calcifications of the aorta and its major branches. Reproductive: The uterus is surgically absent. There is a well-circumscribed 3.9 x 5.3 cm lesion  in the right adnexa, with internal CT attenuation of 30-31 Hounsfield units. This is incompletely characterized on the current examination but suspicious. Further evaluation with contrast-enhanced MRI pelvis versus pelvic ultrasound is recommended. Other: The visualized soft tissues and abdominal wall are unremarkable. Musculoskeletal: No suspicious osseous lesions. There are moderate multilevel degenerative changes in the visualized spine. IMPRESSION: 1. There is mild-to-moderate right hydronephrosis and upper hydroureter. No nephroureterolithiasis on either side. Consider further evaluation with contrast-enhanced study with delayed images. 2. There is a well-circumscribed 3.9 x 5.3 cm lesion in the right adnexa, incompletely characterized on the current examination but suspicious. Further evaluation with contrast-enhanced MRI pelvis versus pelvic ultrasound is recommended. 3. There are new bilateral trace pleural effusions with associated atelectatic changes in the bilateral lower lobes. 4. Multiple other nonacute observations, as described above. Aortic Atherosclerosis (ICD10-I70.0). Electronically Signed   By: Beula Brunswick M.D.   On: 05/13/2024 11:22   DG Chest Port 1 View Result Date: 05/13/2024 CLINICAL DATA:  Altered mental status. Patient became unresponsive on the toilet. EXAM: PORTABLE CHEST 1 VIEW COMPARISON:  Radiographs 05/09/2024 and 04/22/2024.  CT 08/02/2012. FINDINGS: 0948 hours. Patient is rotated to the left. The heart size and mediastinal contours are stable with aortic atherosclerosis. There are surgical clips at the thoracic inlet. Patchy left basilar opacities unchanged from the recent prior study.  The right lung remains clear. No evidence of pneumothorax or significant pleural effusion. No acute osseous findings status post bilateral shoulder arthroplasty. IMPRESSION: Stable examination with mild residual left basilar atelectasis or infiltrate. No new findings. Electronically Signed   By: Elmon Hagedorn M.D.   On: 05/13/2024 10:07   CT HEAD WO CONTRAST Result Date: 05/13/2024 CLINICAL DATA:  Mental status change, unknown cause. EXAM: CT HEAD WITHOUT CONTRAST TECHNIQUE: Contiguous axial images were obtained from the base of the skull through the vertex without intravenous contrast. RADIATION DOSE REDUCTION: This exam was performed according to the departmental dose-optimization program which includes automated exposure control, adjustment of the mA and/or kV according to patient size and/or use of iterative reconstruction technique. COMPARISON:  CT head without contrast 05/09/2024. MR head without contrast 05/09/2024. FINDINGS: Brain: No acute infarct, hemorrhage, or mass lesion is present. Remote lacunar infarct is again noted in the posterior right lentiform nucleus. Chronic ischemic changes are again noted in the thalami bilaterally. Moderate generalized atrophy and diffuse white matter disease is stable. The ventricles are proportionate to the degree of atrophy. No significant extraaxial fluid collection is present. The brainstem and cerebellum are within normal limits. Midline structures are within normal limits. Vascular: Atherosclerotic calcifications are present within the cavernous internal carotid arteries bilaterally. No hyperdense vessel is present. Skull: Calvarium is intact. No focal lytic or blastic lesions are present. No significant extracranial soft tissue lesion is present. Sinuses/Orbits: The paranasal sinuses and mastoid air cells are clear. Bilateral lens replacements are noted. Globes and orbits are otherwise unremarkable. IMPRESSION: 1. No acute intracranial abnormality or  significant interval change. 2. Stable remote lacunar infarct of the posterior right lentiform nucleus. 3. Stable moderate generalized atrophy and diffuse white matter disease. This likely reflects the sequela of chronic microvascular ischemia. Electronically Signed   By: Audree Leas M.D.   On: 05/13/2024 09:02    ------  Alla Ar, NP Pager: [phone]   Please contact the urology consult pager with any further questions/concerns.

## 2024-05-14 NOTE — Progress Notes (Signed)
 PROGRESS NOTE        PATIENT DETAILS Name: Christine Leblanc Age: 89 y.o. Sex: female Date of Birth: 23-Oct-1929 Admit Date: 05/13/2024 Admitting Physician Mandy Second, MD UVO:[id number], Lethia Raveling, MD  Brief Summary: Patient is a 89 y.o.  female with history of COPD, HLD, HTN, chronic dysphagia, GERD who was hospitalized at [facility] from 5/10-5/12-4 AKI and TIA (MRI brain negative for CVA)-presented to the hospitalist service-for syncopal episode (after standing from toilet.)  Significant events: 5/14>> admit to [facility]  Significant studies: 5/11>> echo: EF 70-75%, grade 1 diastolic dysfunction. 5/14>> CT head: No acute abnormality. 5/14>> CXR: No PNA 5/14>> CT chest/abdomen/pelvis: Mild/moderate right hydronephrosis, 3.9 x 5.3 cm lesion in the right adnexa. 5/14>> mild to moderate right hydronephrosis  Significant microbiology data: 5/14>> blood culture: Gram-negative rod 5/14>> urine culture: Pending  Procedures: None  Consults: None  Subjective: Lying comfortably in bed-denies any chest pain or shortness of breath.  Objective: Vitals: Blood pressure (!) 143/62, pulse 72, temperature (!) 97.5 F (36.4 C), temperature source Oral, resp. rate 16, height 5\' 1"  (1.549 m), weight 53 kg, SpO2 97%.   Exam: Gen Exam:Alert awake-not in any distress HEENT:atraumatic, normocephalic Chest: B/L clear to auscultation anteriorly CVS:S1S2 regular Abdomen:soft non tender, non distended Extremities:no edema Neurology: Non focal Skin: no rash  Pertinent Labs/Radiology:    Latest Ref Rng & Units 05/14/2024    4:42 AM 05/13/2024    8:48 AM 05/13/2024    8:47 AM  CBC  WBC 4.0 - 10.5 K/uL 17.3     Hemoglobin 12.0 - 15.0 g/dL 8.6  9.9  9.2   Hematocrit 36.0 - 46.0 % 26.4  29.0  27.0   Platelets 150 - 400 K/uL 79       Lab Results  Component Value Date   NA 131 (L) 05/14/2024   K 3.6 05/14/2024   CL 101 05/14/2024   CO2 20 (L) 05/14/2024       Assessment/Plan: Syncope Probably orthostatic or micturition syncope (occurred after she used bathroom and stood up) However D-dimer significantly elevated-PE possible-unable to do CTA given kidney function-checking Dopplers of lower legs first (not hypoxic-on room air-no SOB)  Complicated UTI-gram-negative bacteremia This is in the setting of right-sided hydronephrosis-continue Rocephin  and follow culture sensitivity-see below regarding right-sided hydronephrosis Note-discharged from [facility] recently on oral Duricef.  Moderate right-sided hydronephrosis No obvious nephrolithiasis seen on CT imaging-however does have a right adnexal mass on CT imaging.  Unclear whether this lesion is causing mass effect and resulting in hydronephrosis Will discuss with urology-given that patient has AKI and bacteremia/UTI.  AKI Not exactly clear if she has had CKD prior to her recent hospitalization-prior levels of creatinine were essentially normal. AKI is multifactorial at this point-likely due to dehydration (poor oral intake)-UTI/bacteremia and underlying hydronephrosis. Avoid nephrotoxic agents-gently hydrate See above regarding plans to discuss with urology.  Normocytic anemia Likely due to AKI/acute illness-no evidence of blood loss Follow CBC  Thrombocytopenia Mild Probably due to gram-negative bacteremia Follow CBC-no evidence of bleeding currently.  Minimally elevated troponin Secondary to demand ischemia-type II non-STEMI Trend is flat-clinically insignificant without any anginal symptoms Furthermore-elderly patient-has ongoing AKI-clearly not a candidate for any advanced/aggressive therapies.  3.9 x 5.3 cm right adnexal lesion Unclear if this is causing compression effect and causing hydronephrosis on the right side. Will need to discuss with family  before we initiate workup-given age/frailty.  Hypothyroidism Synthroid   History of recent TIA Nonfocal exam Aspirin /Plavix /statin  (per prior DC summary on 5/12-aspirin  Plavix  x 21 days followed by aspirin )  HTN BP currently stable without the use of any antihypertensive Resume when able  GERD PPI  Debility/deconditioning/dysphagia PT/OT/SLP eval Continue to 1 diet with thin liquids.  Palliative care DNR in place Given advanced age/frailty-not a candidate for aggressive therapies. Will need to reach out to family and try and delineate goals of care-I have left a voicemail for daughter Avanell Bob.   Code status:   Code Status: Limited: Do not attempt resuscitation (DNR) -DNR-LIMITED -Do Not Intubate/DNI    DVT Prophylaxis: Place and maintain sequential compression device Start: 05/13/24 1421   Family Communication: Daughter-Lori [phone]-left voicemail 5/15.   Disposition Plan: Status is: Inpatient Remains inpatient appropriate because: Severity of illness   Planned Discharge Destination:Home health SNF   Diet: Diet Order             DIET - DYS 1 Room service appropriate? Yes; Fluid consistency: Thin  Diet effective now                     Antimicrobial agents: Anti-infectives (From admission, onward)    Start     Dose/Rate Route Frequency Ordered Stop   05/14/24 1200  cefTRIAXone  (ROCEPHIN ) 1 g in sodium chloride  0.9 % 100 mL IVPB        1 g 200 mL/hr over 30 Minutes Intravenous Every 24 hours 05/13/24 1413     05/13/24 1200  cefTRIAXone  (ROCEPHIN ) 1 g in sodium chloride  0.9 % 100 mL IVPB        1 g 200 mL/hr over 30 Minutes Intravenous  Once 05/13/24 1151 05/13/24 1428        MEDICATIONS: Scheduled Meds:  aspirin  EC  81 mg Oral Daily   clopidogrel   75 mg Oral Daily   levothyroxine   88 mcg Oral Once per day on Monday Tuesday Wednesday Thursday Friday Saturday   And   [START ON 05/17/2024] levothyroxine   44 mcg Oral Every Sunday   pantoprazole   40 mg Oral BID AC   pravastatin   40 mg Oral Daily   Continuous Infusions:  cefTRIAXone  (ROCEPHIN )  IV     lactated ringers   100 mL/hr at 05/13/24 1658   PRN Meds:.acetaminophen  **OR** acetaminophen , albuterol    I have personally reviewed following labs and imaging studies  LABORATORY DATA: CBC: Recent Labs  Lab 05/09/24 1301 05/10/24 1744 05/13/24 0835 05/13/24 0847 05/13/24 0848 05/14/24 0442  WBC 17.2* 16.0* 13.7*  --   --  17.3*  NEUTROABS 15.5*  --  11.4*  --   --   --   HGB 10.1* 9.3* 9.3* 9.2* 9.9* 8.6*  HCT 31.6* 28.2* 28.5* 27.0* 29.0* 26.4*  MCV 92.1 89.8 89.6  --   --  88.0  PLT 138* 122* 85*  --   --  79*    Basic Metabolic Panel: Recent Labs  Lab 05/09/24 1301 05/11/24 0412 05/13/24 0835 05/13/24 0847 05/13/24 0848 05/14/24 0442  NA 133* 132* 133* 134* 134* 131*  K 3.8 3.6 3.9 3.8 3.8 3.6  CL 102 105 101  --  102 101  CO2 21* 20* 20*  --   --  20*  GLUCOSE 93 127* 107*  --  109* 87  BUN 35* 49* 56*  --  49* 55*  CREATININE 2.61* 2.71* 3.26*  --  3.20* 3.03*  CALCIUM  8.5* 7.8* 8.1*  --   --  7.8*    GFR: Estimated Creatinine Clearance: 8.9 mL/min (A) (by C-G formula based on SCr of 3.03 mg/dL (H)).  Liver Function Tests: Recent Labs  Lab 05/09/24 1301 05/13/24 0835  AST 36 29  ALT 16 17  ALKPHOS 94 98  BILITOT 1.1 0.6  PROT 6.6 5.4*  ALBUMIN 2.9* 2.0*   No results for input(s): "LIPASE", "AMYLASE" in the last 168 hours. Recent Labs  Lab 05/13/24 0855  AMMONIA 17    Coagulation Profile: Recent Labs  Lab 05/09/24 1301  INR 1.4*    Cardiac Enzymes: No results for input(s): "CKTOTAL", "CKMB", "CKMBINDEX", "TROPONINI" in the last 168 hours.  BNP (last 3 results) No results for input(s): "PROBNP" in the last 8760 hours.  Lipid Profile: No results for input(s): "CHOL", "HDL", "LDLCALC", "TRIG", "CHOLHDL", "LDLDIRECT" in the last 72 hours.  Thyroid  Function Tests: Recent Labs    05/13/24 0835  TSH 2.923    Anemia Panel: Recent Labs    05/13/24 1700  FERRITIN 274  TIBC 141*  IRON 8*    Urine analysis:    Component Value Date/Time    COLORURINE AMBER (A) 05/13/2024 1037   APPEARANCEUR TURBID (A) 05/13/2024 1037   LABSPEC 1.016 05/13/2024 1037   PHURINE 5.0 05/13/2024 1037   GLUCOSEU NEGATIVE 05/13/2024 1037   HGBUR MODERATE (A) 05/13/2024 1037   BILIRUBINUR NEGATIVE 05/13/2024 1037   BILIRUBINUR negative 12/14/2023 1454   KETONESUR 5 (A) 05/13/2024 1037   PROTEINUR 100 (A) 05/13/2024 1037   UROBILINOGEN 0.2 12/14/2023 1454   UROBILINOGEN 0.2 08/02/2012 1603   NITRITE POSITIVE (A) 05/13/2024 1037   LEUKOCYTESUR LARGE (A) 05/13/2024 1037    Sepsis Labs: Lactic Acid, Venous    Component Value Date/Time   LATICACIDVEN 1.5 05/13/2024 0849    MICROBIOLOGY: Recent Results (from the past 240 hours)  Blood Culture (Routine X 2)     Status: None (Preliminary result)   Collection Time: 05/13/24  8:16 AM   Specimen: BLOOD  Result Value Ref Range Status   Specimen Description BLOOD SITE NOT SPECIFIED  Final   Special Requests   Final    BOTTLES DRAWN AEROBIC AND ANAEROBIC Blood Culture adequate volume   Culture  Setup Time   Final    GRAM NEGATIVE RODS AEROBIC BOTTLE ONLY Organism ID to follow Performed at [facility], [address], [place], [place] [address]    Culture GRAM NEGATIVE RODS  Final   Report Status PENDING  Incomplete    RADIOLOGY STUDIES/RESULTS: US  RENAL Result Date: 05/13/2024 CLINICAL DATA:  Acute kidney injury EXAM: RENAL / URINARY TRACT ULTRASOUND COMPLETE COMPARISON:  CT scan 05/13/2024 FINDINGS: Right Kidney: Renal measurements: 11.3 by 5.9 by 5.1 cm = volume: 170 a mL. Echogenicity within normal limits. Mild to moderate right hydronephrosis. Left Kidney: Renal measurements: 7.4 by 3.6 by 3.8 cm = volume: 53 mL. Cortical thinning in the left kidney with mild prominence of fatty tissues in renal hilum probably due to thinning of parenchymal tissue. Likely areas of scarring in the left upper to mid kidney for example on image 24 series 1. Moderately hyperechoic left renal parenchyma. No  mass or hydronephrosis visualized. Bladder: Bladder not visualized, polyp possibly empty or obscured by bowel. Other: On CT earlier today there is a question of edema tracking along the lesser curvature of the stomach raising the possibility of inflammation, as well as some suspected right gastric adenopathy along the gastrohepatic ligament; these areas are not specifically observed on today's renal ultrasound.  Moreover, the right distal ureter did not appear dilated on the earlier CT, with the transition from dilated to nondilated right ureter in the vicinity of the iliac vessel cross over and right ovarian mass. IMPRESSION: 1. Mild to moderate right hydronephrosis. Although the cause is not seen directly on today's renal ultrasound, the transition from dilated to nondilated ureter appear to be in the vicinity of the iliac vessel cross over and right ovarian mass on today's CT. 2. Cortical thinning and volume loss in the left kidney with areas of scarring in the left upper to mid kidney. Moderately hyperechoic left renal parenchyma. 3. On CT earlier today there is a question of edema tracking along the lesser curvature of the stomach raising the possibility of inflammation, as well as some suspected right gastric adenopathy along the gastrohepatic ligament; these areas are not specifically observed on today's renal ultrasound. Electronically Signed   By: Freida Jes M.D.   On: 05/13/2024 14:53   CT CHEST ABDOMEN PELVIS WO CONTRAST Result Date: 05/13/2024 CLINICAL DATA:  AMS, cough, abdominal pain EXAM: CT CHEST, ABDOMEN AND PELVIS WITHOUT CONTRAST TECHNIQUE: Multidetector CT imaging of the chest, abdomen and pelvis was performed following the standard protocol without IV contrast. RADIATION DOSE REDUCTION: This exam was performed according to the departmental dose-optimization program which includes automated exposure control, adjustment of the mA and/or kV according to patient size and/or use of  iterative reconstruction technique. COMPARISON:  CT scan chest, abdomen and pelvis from 09/30/2018. FINDINGS: CT CHEST FINDINGS Cardiovascular: Normal cardiac size. No pericardial effusion. No aortic aneurysm. There are coronary artery calcifications, in keeping with coronary artery disease. There are also moderate to severe peripheral atherosclerotic vascular calcifications of thoracic aorta and its major branches. Mediastinum/Nodes: Visualized thyroid  gland appears grossly unremarkable. No solid / cystic mediastinal masses. The esophagus is nondistended precluding optimal assessment. No mediastinal or axillary lymphadenopathy by size criteria. Evaluation of bilateral hila is limited due to lack on intravenous contrast: however, no large hilar lymphadenopathy identified. Lungs/Pleura: The central tracheo-bronchial tree is patent. Redemonstration of biapical pleuroparenchymal disease, which appears slightly more prominent than the prior exam. For example, a peripheral solid nodule along the right lung apex laterally previously measured 1.3 x 1.9 cm, currently 1.5 x 2.2 cm. There are new bilateral trace pleural effusions with associated atelectatic changes in the bilateral lower lobes. There are several, sub 4 mm, calcified and noncalcified nodules throughout bilateral lungs (marked with electronic arrow sign on series 100). Musculoskeletal: The visualized soft tissues of the chest wall are grossly unremarkable. No suspicious osseous lesions. There are mild to moderate multilevel degenerative changes in the visualized spine. 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 Note is made of bilateral shoulder arthroplasty. CT ABDOMEN PELVIS FINDINGS Hepatobiliary: The liver is normal in size. There is subtle liver surface irregularity/nodularity, favoring cirrhosis. No suspicious mass. No intrahepatic or extrahepatic bile duct dilation. No calcified gallstones. Normal gallbladder wall thickness. No  pericholecystic inflammatory changes. Pancreas: Unremarkable. No pancreatic ductal dilatation or surrounding inflammatory changes. Spleen: Within normal limits. No focal lesion. Adrenals/Urinary Tract: Adrenal glands are unremarkable. No suspicious renal mass within the limitations of this unenhanced exam. No nephroureterolithiasis on either side. Note is made of mild-to-moderate right hydronephrosis and upper hydroureter. Rest of the right ureter is not well visualized due to lack of intravenous contrast. No left hydronephrosis. Urinary bladder is under distended, precluding optimal assessment. However, no large mass or stones identified. No perivesical fat stranding. Stomach/Bowel: No disproportionate dilation  of the small or large bowel loops. No evidence of abnormal bowel wall thickening or inflammatory changes. The appendix was not visualized; however there is no acute inflammatory process in the right lower quadrant. Vascular/Lymphatic: There is trace amount of free fluid in the dependent pelvis. No walled-off abscess or loculated collection. No pneumoperitoneum. No abdominal or pelvic lymphadenopathy, by size criteria. No aneurysmal dilation of the major abdominal arteries. There are moderate peripheral atherosclerotic vascular calcifications of the aorta and its major branches. Reproductive: The uterus is surgically absent. There is a well-circumscribed 3.9 x 5.3 cm lesion in the right adnexa, with internal CT attenuation of 30-31 Hounsfield units. This is incompletely characterized on the current examination but suspicious. Further evaluation with contrast-enhanced MRI pelvis versus pelvic ultrasound is recommended. Other: The visualized soft tissues and abdominal wall are unremarkable. Musculoskeletal: No suspicious osseous lesions. There are moderate multilevel degenerative changes in the visualized spine. IMPRESSION: 1. There is mild-to-moderate right hydronephrosis and upper hydroureter. No  nephroureterolithiasis on either side. Consider further evaluation with contrast-enhanced study with delayed images. 2. There is a well-circumscribed 3.9 x 5.3 cm lesion in the right adnexa, incompletely characterized on the current examination but suspicious. Further evaluation with contrast-enhanced MRI pelvis versus pelvic ultrasound is recommended. 3. There are new bilateral trace pleural effusions with associated atelectatic changes in the bilateral lower lobes. 4. Multiple other nonacute observations, as described above. Aortic Atherosclerosis (ICD10-I70.0). Electronically Signed   By: Beula Brunswick M.D.   On: 05/13/2024 11:22   DG Chest Port 1 View Result Date: 05/13/2024 CLINICAL DATA:  Altered mental status. Patient became unresponsive on the toilet. EXAM: PORTABLE CHEST 1 VIEW COMPARISON:  Radiographs 05/09/2024 and 04/22/2024.  CT 08/02/2012. FINDINGS: 0948 hours. Patient is rotated to the left. The heart size and mediastinal contours are stable with aortic atherosclerosis. There are surgical clips at the thoracic inlet. Patchy left basilar opacities unchanged from the recent prior study. The right lung remains clear. No evidence of pneumothorax or significant pleural effusion. No acute osseous findings status post bilateral shoulder arthroplasty. IMPRESSION: Stable examination with mild residual left basilar atelectasis or infiltrate. No new findings. Electronically Signed   By: Elmon Hagedorn M.D.   On: 05/13/2024 10:07   CT HEAD WO CONTRAST Result Date: 05/13/2024 CLINICAL DATA:  Mental status change, unknown cause. EXAM: CT HEAD WITHOUT CONTRAST TECHNIQUE: Contiguous axial images were obtained from the base of the skull through the vertex without intravenous contrast. RADIATION DOSE REDUCTION: This exam was performed according to the departmental dose-optimization program which includes automated exposure control, adjustment of the mA and/or kV according to patient size and/or use of iterative  reconstruction technique. COMPARISON:  CT head without contrast 05/09/2024. MR head without contrast 05/09/2024. FINDINGS: Brain: No acute infarct, hemorrhage, or mass lesion is present. Remote lacunar infarct is again noted in the posterior right lentiform nucleus. Chronic ischemic changes are again noted in the thalami bilaterally. Moderate generalized atrophy and diffuse white matter disease is stable. The ventricles are proportionate to the degree of atrophy. No significant extraaxial fluid collection is present. The brainstem and cerebellum are within normal limits. Midline structures are within normal limits. Vascular: Atherosclerotic calcifications are present within the cavernous internal carotid arteries bilaterally. No hyperdense vessel is present. Skull: Calvarium is intact. No focal lytic or blastic lesions are present. No significant extracranial soft tissue lesion is present. Sinuses/Orbits: The paranasal sinuses and mastoid air cells are clear. Bilateral lens replacements are noted. Globes and orbits are otherwise unremarkable. IMPRESSION:  1. No acute intracranial abnormality or significant interval change. 2. Stable remote lacunar infarct of the posterior right lentiform nucleus. 3. Stable moderate generalized atrophy and diffuse white matter disease. This likely reflects the sequela of chronic microvascular ischemia. Electronically Signed   By: Audree Leas M.D.   On: 05/13/2024 09:02     LOS: 1 day   Kimberly Penna, MD  [facility]    To contact the attending provider between 7A-7P or the covering provider during after hours 7P-7A, please log into the web site [email or web address] and access using universal [place] password for that web site. If you do not have the password, please call the hospital operator.  05/14/2024, 9:06 AM

## 2024-05-14 NOTE — Progress Notes (Addendum)
 Initial Nutrition Assessment  DOCUMENTATION CODES:  Severe malnutrition in context of chronic illness  INTERVENTION:  Boost Breeze po TID, each supplement provides 250 kcal and 9 grams of protein. Magic cup TID with meals, each supplement provides 290 kcal and 9 grams of protein. MVI with minerals daily. Collect new weight to assess trend Assistance with meal tray ordering to maximize intake  NUTRITION DIAGNOSIS:  Severe Malnutrition related to chronic illness (COPD) as evidenced by severe muscle depletion, severe fat depletion.  GOAL:  Patient will meet greater than or equal to 90% of their needs  MONITOR:  PO intake, Supplement acceptance, Diet advancement, Labs, Weight trends  REASON FOR ASSESSMENT:  Consult Assessment of nutrition requirement/status, Poor PO  ASSESSMENT:   Pt with PMH significant for dysphagia, COPD, asthma, HLD, anemia, GERD, seizures, thyroidectomy, HTN. Admitted d/t syncopal episode. Found to have complicated UTI-gram-negative bacteremia. Also noted with right hydronephrosis and AKI. Recently hospitalized [facility] from 5/10-5/12 for AKI and TIA.  5/14 admitted 5/15 cytoscopy   Patient is scheduled for cytoscopy w/ R retrograde pyelogram and R ureteral stent placement this afternoon. Elderly and frail in appearance.   24 Hour Recall B: sausage biscuit and gravy w/ water  or ginger ale L: salmon patty or spaghetti w/ water  or ginger ale D: pie OR same as lunch w/ water  or ginger ale   Spoke with patient, her son, and daughter at bedside. She has dysphagia at baseline. Her family takes care of her and provides her meals. While they are not necessarily formally pureed meals, they are mashed up and served with sauces and gravies. She is edentulous, but states this does not inhibit intake. Appetite at baseline PTA. Noted with approximately 50% of her breakfast meal consumed at bedside this morning.   Has tried milky supplements in the past, but prefers  [employer]. Has been some time since she consumed these at home, per family report. Patient and family amicable to restart while admitted and when diet advanced s/p surgery.   Admit/Current Weight: 53kg - appears pulled forward from previous admission; recommend new weight collection  UBW endorsed as around 107lbs with current weight documented as 117lbs. Will recommend weight collection to assess trend. Some edema to BLEs on exam. Per chart review, she has shown weight trend up in last 3-4 months. No skin breakdown noted. Did have small BM this morning. Family endorse that constipation is baseline. Will likely need bowel regimen s/p surgery.  Meds: pantoprazole , levothyroxine   Drips: IV ABX  Labs:  Na+ 134>131 (L) K+ 3.6 (wdl) BUN 56>49>55 (H) Crt 1.61>0.96>0.45 (H) WBC 16.0>13.7>17.3 (H) CBGs 87-109 x24 hours A1c 5.4 95/2025)  NUTRITION - FOCUSED PHYSICAL EXAM:  Flowsheet Row Most Recent Value  Orbital Region Severe depletion  Upper Arm Region Severe depletion  Thoracic and Lumbar Region Severe depletion  Buccal Region Severe depletion  Temple Region Severe depletion  Clavicle Bone Region Severe depletion  Clavicle and Acromion Bone Region Severe depletion  Scapular Bone Region Severe depletion  Dorsal Hand Severe depletion  Patellar Region Unable to assess  [edema]  Anterior Thigh Region Unable to assess  [edema]  Posterior Calf Region Unable to assess  [edema]  Edema (RD Assessment) Mild  Hair Reviewed  Eyes Reviewed  Mouth Reviewed   Skin Reviewed  Nails Reviewed    Diet Order:   Diet Order             Diet NPO time specified  Diet effective now  EDUCATION NEEDS:   Education needs have been addressed  Skin:  Skin Assessment: Reviewed RN Assessment  Last BM:  5/15  Height:  Ht Readings from Last 1 Encounters:  05/13/24 5\' 1"  (1.549 m)   Weight:  Wt Readings from Last 1 Encounters:  05/13/24 53 kg   Ideal Body Weight:     BMI:  Body  mass index is 22.08 kg/m.  Estimated Nutritional Needs:   Kcal:  1300-1500kcals  Protein:  60-75g  Fluid:  >1.3L/day  Con Decant MS, RD, LDN Registered Dietitian Clinical Nutrition RD Inpatient Contact Info in Amion

## 2024-05-14 NOTE — Progress Notes (Signed)
 BLE venous duplex has been completed.   Results can be found under chart review under CV PROC. 05/14/2024 3:54 PM Valory Wetherby RVT, RDMS

## 2024-05-14 NOTE — Anesthesia Postprocedure Evaluation (Signed)
 Anesthesia Post Note  Patient: Christine Leblanc  Procedure(s) Performed: CYSTOSCOPY, WITH RETROGRADE PYELOGRAM AND URETERAL STENT INSERTION (Right: Ureter)     Patient location during evaluation: PACU Anesthesia Type: General Level of consciousness: awake and alert Pain management: pain level controlled Vital Signs Assessment: post-procedure vital signs reviewed and stable Respiratory status: spontaneous breathing, nonlabored ventilation, respiratory function stable and patient connected to nasal cannula oxygen Cardiovascular status: blood pressure returned to baseline and stable Postop Assessment: no apparent nausea or vomiting Anesthetic complications: no  No notable events documented.  Last Vitals:  Vitals:   05/14/24 2015 05/14/24 2026  BP: (!) 138/58 (!) 157/61  Pulse: 71   Resp: 18 15  Temp:  (!) 36.4 C  SpO2: 93% 94%    Last Pain:  Vitals:   05/14/24 2026  TempSrc:   PainSc: 0-No pain                 Willian Harrow

## 2024-05-14 NOTE — Progress Notes (Signed)
 PT Cancellation Note  Patient Details Name: Christine Leblanc MRN: [id number] DOB: 1929/05/22   Cancelled Treatment:    Reason Eval/Treat Not Completed: Patient not medically ready D-dimer elevated, noted order for doppler to r/u DVT. Will monitor for results and follow-up for formal evaluation when appropriate. If earlier assessment requested, please secure chat myself of [facility].  Jory Ng, PT, DPT [facility]  Rehabilitation Services Physical Therapist Office: [phone] Website: [email or web address]   Alinda Irani 05/14/2024, 11:33 AM

## 2024-05-14 NOTE — Evaluation (Signed)
 Clinical/Bedside Swallow Evaluation Patient Details  Name: Christine Leblanc MRN: [id number] Date of Birth: 11-21-1929  Today's Date: 05/14/2024 Time: SLP Start Time (ACUTE ONLY): 1040 SLP Stop Time (ACUTE ONLY): 1100 SLP Time Calculation (min) (ACUTE ONLY): 20 min  Past Medical History:  Past Medical History:  Diagnosis Date   Anemia    Asthma    Baker's cyst    Cancer (HCC)    skin Ca- ? basal cell    Chronic back pain    Chronic neck pain    Degenerative joint disease    Left shoulder; cervical spine, knees & hands    Gastroesophageal reflux disease    Hiatal hernia; distal esophageal web requiring dilatation; gastric polyps; gastritis; refuses colonoscopy   GERD (gastroesophageal reflux disease)    History of stress test 1990's   stress test done under the care of Dr. Macdonald Savoy & Dr. Katheryne Pane, now being followed by Dr. Amanda Jungling- in [place] , recently seen & told to f/U in one yr.    Hyperlipidemia 04/27/2011   Hypertension    Hypothyroidism    Lymphocytic thyroiditis    Mild carotid artery disease (HCC)    Multiple thyroid  nodules 2010   Adenomatous; thyroidectomy in 2010   Orthostasis    Osteoarthritis of right knee 07/27/2014   Pneumonia    hosp. for pneumonia- long time ago    Primary localized osteoarthrosis of right shoulder 06/21/2015   Seizures (HCC)    yes- as a child- & into adult years, states she took med. for them at one time, stopped at 32 yrs. of age    Syncope and collapse 2007   Possible CVA in 2012 with left lower extremity weakness; refused hospitalization; CT-Atrophy and chronic microvascular ischemic change.    Past Surgical History:  Past Surgical History:  Procedure Laterality Date   ABDOMINAL HYSTERECTOMY     fibroids   CATARACT EXTRACTION     Bilateral; redo surgery on the right for incomplete primary procedure   COLONOSCOPY  Remote   ESOPHAGOGASTRODUODENOSCOPY  11/2009   Dr. Nolene Baumgarten: probable distal web s/p dilation small hiatal hernia/gastric  polyps/mild gastritis, appeared to have narrowing at the junction of D1 and D2 dilated up to 12 mm   ESOPHAGOGASTRODUODENOSCOPY N/A 03/21/2015   Surgeon: Alyce Jubilee, MD; proximal esophageal web s/p dilation, multiple gastric polyps s/p multiple biopsies, mild erosive gastritis s/p biopsy.  Pathology with fundic gland polyps, chronic gastritis, negative for H. pylori.   EYE SURGERY     PROLAPSED UTERINE FIBROID LIGATION     outcomed with rectocele & cystocele   SAVORY DILATION N/A 03/21/2015   Procedure: SAVORY DILATION;  Surgeon: Alyce Jubilee, MD;  [facility]: [facility];  Service: Endoscopy;  Laterality: N/A;   SHOULDER SURGERY     left   TOTAL KNEE ARTHROPLASTY Right 07/27/2014   Procedure: RIGHT TOTAL KNEE ARTHROPLASTY;  Surgeon: Neville Barbone, MD;  Location: [facility] OR;  Service: Orthopedics;  Laterality: Right;   TOTAL SHOULDER ARTHROPLASTY Right 06/21/2015   Procedure: RIGHT TOTAL SHOULDER ARTHROPLASTY;  Surgeon: Osa Blase, MD;  Location: [facility] OR;  Service: Orthopedics;  Laterality: Right;   TOTAL THYROIDECTOMY  2010   HPI:  Christine Leblanc is a 89 yo female presenting to ED 5/14 with AMS after a syncopal episode when standing from toilet. Found to have moderate R sided hydronephrosis with adnexal mass. Recently discharged from [facility] 5/12 after presenting with stroke like symptoms, MRI negative. Found to have a UTI and was  discharged with oral abx. Seen by SLP 5/12 with recommendations to continue baseline diet of Dys 1 solids with thin liquids. MBS 07/24/23 shows stable severe oropharyngeal dysphagia characterized by decreased coordination leading to moderate pharyngeal residue with subsequent aspiration. A L head turn with chin tuck was more effective in promoting pharyngeal clearance and improving airway closure. She has previously declined placement of alternative means of nutrition multiple times and has had any recent admissions for PNA . PMH includes COPD, dyslipidemia, HTN, history of  TIA and CVA, iron deficiency, anxiety, hypothyroidism, CKD 3B    Assessment / Plan / Recommendation  Clinical Impression  Pt continues to present with s/s of dysphagia and aspiration which is expected given history of severe oropharyngeal dysphagia. Pt and her son agree that they are not considering AMN and wish to continue baseline diet of Dys 1 solids with thin liquids. Her voice is breathy and her cough appears weak, occurring in the presence and absence of POs. She swallows multiple times with self-fed straw sips of thin liquids and often coughs immediately after. Recommend continuing current diet using a L head turn and chin tuck as previously recommended. They state they have not received [facility] services and are eager to resume HEP with ST. Offered to complete a repeated MBS but discussed reduced likelihood of improvement given sporadic therapy. Discussed ability to continue on an OP basis and they were agreeable. SLP will continue following acutely to reinforce swallow exercises with recommendations that pt f/u with an OP SLP to continue HEP. SLP Visit Diagnosis: Dysphagia, oropharyngeal phase (R13.12)    Aspiration Risk  Moderate aspiration risk    Diet Recommendation Dysphagia 1 (Puree);Thin liquid    Liquid Administration via: Cup;Straw Medication Administration: Crushed with puree Supervision: Patient able to self feed Compensations: Minimize environmental distractions;Slow rate;Small sips/bites;Follow solids with liquid;Clear throat intermittently (L head turn with chin tuck) Postural Changes: Seated upright at 90 degrees;Remain upright for at least 30 minutes after po intake    Other  Recommendations Oral Care Recommendations: Oral care QID    Recommendations for follow up therapy are one component of a multi-disciplinary discharge planning process, led by the attending physician.  Recommendations may be updated based on patient status, additional functional criteria and insurance  authorization.  Follow up Recommendations Outpatient SLP      Assistance Recommended at Discharge    Functional Status Assessment Patient has had a recent decline in their functional status and demonstrates the ability to make significant improvements in function in a reasonable and predictable amount of time.  Frequency and Duration min 2x/week  1 week       Prognosis Prognosis for improved oropharyngeal function: Fair Barriers to Reach Goals: Severity of deficits;Time post onset      Swallow Study   General HPI: SHAQUILLA HAFLER is a 89 yo female presenting to ED 5/14 with AMS after a syncopal episode when standing from toilet. Found to have moderate R sided hydronephrosis with adnexal mass. [facility] from [facility] 5[facility]12 after presenting with stroke like symptoms, MRI negative. Found to have a UTI and was discharged with oral abx. Seen by SLP 5/12 with recommendations to continue baseline diet of Dys 1 solids with thin liquids. MBS 07/24/23 shows stable severe oropharyngeal dysphagia characterized by decreased coordination leading to moderate pharyngeal residue with subsequent aspiration. A L head turn with chin tuck was more effective in promoting pharyngeal clearance and improving airway closure. She has previously declined placement of alternative means of nutrition  multiple times and has had any recent admissions for PNA . PMH includes COPD, dyslipidemia, HTN, history of TIA and CVA, iron deficiency, anxiety, hypothyroidism, CKD 3B Type of Study: Bedside Swallow Evaluation Previous Swallow Assessment: see HPI Diet Prior to this Study: Dysphagia 1 (pureed);Thin liquids (Level 0) Temperature Spikes Noted: No Respiratory Status: Room air History of Recent Intubation: No Behavior/Cognition: Alert;Cooperative;Confused;Pleasant mood Oral Cavity Assessment: Within Functional Limits Oral Care Completed by SLP: No Oral Cavity - Dentition: Edentulous Vision: Functional for  self-feeding Self-Feeding Abilities: Able to feed self Patient Positioning: Upright in bed Baseline Vocal Quality: Breathy Volitional Cough: Weak Volitional Swallow: Able to elicit    Oral/Motor/Sensory Function Overall Oral Motor/Sensory Function: Within functional limits   Ice Chips Ice chips: Not tested   Thin Liquid Thin Liquid: Impaired Presentation: Straw Pharyngeal  Phase Impairments: Multiple swallows;Cough - Immediate    Nectar Thick Nectar Thick Liquid: Not tested   Honey Thick Honey Thick Liquid: Not tested   Puree Puree: Not tested   Solid     Solid: Not tested      Amil Kale, M.A., CCC-SLP Speech Language Pathology, Acute Rehabilitation Services  Secure Chat preferred [phone]  05/14/2024,11:17 AM

## 2024-05-14 NOTE — Anesthesia Preprocedure Evaluation (Addendum)
 Anesthesia Evaluation  Patient identified by MRN, date of birth, ID band Patient awake    Reviewed: Allergy & Precautions, NPO status , Patient's Chart, lab work & pertinent test results  Airway Mallampati: II  TM Distance: >3 FB Neck ROM: Full    Dental no notable dental hx. (+) Edentulous Upper, Edentulous Lower   Pulmonary asthma , COPD, former smoker   Pulmonary exam normal        Cardiovascular hypertension, Pt. on medications and Pt. on home beta blockers  Rhythm:Regular Rate:Normal  ECHO:   1. Left ventricular ejection fraction, by estimation, is 70 to 75%. The left ventricle has hyperdynamic function. The left ventricle has no regional wall motion abnormalities. Left ventricular diastolic parameters are consistent with Grade I diastolic dysfunction (impaired relaxation).  2. Right ventricular systolic function is hyperdynamic. The right ventricular size is normal.  3. Left atrial size was mildly dilated.  4. The mitral valve is grossly normal. Trivial mitral valve regurgitation. No evidence of mitral stenosis.  5. The aortic valve was not well visualized. Aortic valve regurgitation is not visualized. No aortic stenosis is present.   Comparison(s): Prior images reviewed side by side. Function is more vigorous from prior.    Neuro/Psych Seizures -, Well Controlled,  TIA negative psych ROS   GI/Hepatic Neg liver ROS,GERD  Medicated,,  Endo/Other  Hypothyroidism    Renal/GU Renal diseaseRight ureteral obstruction     Musculoskeletal  (+) Arthritis , Osteoarthritis,    Abdominal Normal abdominal exam  (+)   Peds  Hematology  (+) Blood dyscrasia, anemia Lab Results      Component                Value               Date                      WBC                      17.3 (H)            05/14/2024                HGB                      8.6 (L)             05/14/2024                HCT                      26.4 (L)             05/14/2024                MCV                      88.0                05/14/2024                PLT                      79 (L)              05/14/2024              Anesthesia Other Findings   Reproductive/Obstetrics  Anesthesia Physical Anesthesia Plan  ASA: 3  Anesthesia Plan: General   Post-op Pain Management:    Induction: Intravenous  PONV Risk Score and Plan: 3 and Ondansetron , Dexamethasone  and Treatment may vary due to age or medical condition  Airway Management Planned: Mask and LMA  Additional Equipment: None  Intra-op Plan:   Post-operative Plan: Extubation in OR  Informed Consent: I have reviewed the patients History and Physical, chart, labs and discussed the procedure including the risks, benefits and alternatives for the proposed anesthesia with the patient or authorized representative who has indicated his/her understanding and acceptance.   Patient has DNR.  Discussed DNR with power of attorney.   Dental advisory given  Plan Discussed with: CRNA  Anesthesia Plan Comments:        Anesthesia Quick Evaluation

## 2024-05-14 NOTE — Transfer of Care (Signed)
 Immediate Anesthesia Transfer of Care Note  Patient: Christine Leblanc  Procedure(s) Performed: CYSTOSCOPY, WITH RETROGRADE PYELOGRAM AND URETERAL STENT INSERTION (Right)  Patient Location: PACU  Anesthesia Type:General  Level of Consciousness: drowsy  Airway & Oxygen Therapy: Patient Spontanous Breathing and Patient connected to nasal cannula oxygen  Post-op Assessment: Report given to RN and Post -op Vital signs reviewed and stable  Post vital signs: Reviewed and stable  Last Vitals:  Vitals Value Taken Time  BP 115/55 05/14/24 1954  Temp 97.7   Pulse 70   Resp 15 05/14/24 1956  SpO2 98   Vitals shown include unfiled device data.  Last Pain:  Vitals:   05/14/24 1726  TempSrc: Oral  PainSc: 2          Complications: No notable events documented.

## 2024-05-14 NOTE — Discharge Instructions (Addendum)
 Follow-up with Dr. Derrick Fling to plan stent removal/exchange.  [facility], [phone] -   Disposition.  Home with hospice Condition.  Guarded CODE STATUS.  DNR Activity.  With assistance as tolerated, full fall precautions. Diet.  Dysphagia 3 diet with nectar thick liquids with feeding assistance and aspiration precautions. Goal of care.  Comfort.

## 2024-05-14 NOTE — TOC CM/SW Note (Signed)
 Transition of Care [facility]) - Inpatient Brief Assessment   Patient Details  Name: Christine Leblanc MRN: [id number] Date of Birth: 07-13-1929  Transition of Care [facility]) CM/SW Contact:    Jannice Mends, LCSW Phone Number: 05/14/2024, 3:43 PM   Clinical Narrative: Patient with recent admission, admitted from home with son. She is currently active with [facility] and uses a walker at home. TOC will continue to follow for needs.    Transition of Care Asessment: Insurance and Status: Insurance coverage has been reviewed Patient has primary care physician: Yes Home environment has been reviewed: From home Prior level of function:: Modified Independent Prior/Current Home Services: Current home services Social Drivers of Health Review: SDOH reviewed no interventions necessary Readmission risk has been reviewed: Yes Transition of care needs: transition of care needs identified, TOC will continue to follow

## 2024-05-14 NOTE — Progress Notes (Signed)
 PHARMACY - PHYSICIAN COMMUNICATION CRITICAL VALUE ALERT - BLOOD CULTURE IDENTIFICATION (BCID)  Christine Leblanc is an 89 y.o. female who presented to [facility] on 05/13/2024 with a chief complaint of altered mental status  Assessment:  Patient with Ecoli bacteremia from urinary source.  CTX-M gene detected.    Name of physician (or Provider) Contacted: Dr. Hilton Lucky  Current antibiotics: ceftriaxone   Changes to prescribed antibiotics recommended: Change ceftriaxone  to meropenem 500mg  IV q12h Recommendations accepted by provider  Results for orders placed or performed during the hospital encounter of 05/13/24  Blood Culture ID Panel (Reflexed) (Collected: 05/13/2024  8:16 AM)  Result Value Ref Range   Enterococcus faecalis NOT DETECTED NOT DETECTED   Enterococcus Faecium NOT DETECTED NOT DETECTED   Listeria monocytogenes NOT DETECTED NOT DETECTED   Staphylococcus species NOT DETECTED NOT DETECTED   Staphylococcus aureus (BCID) NOT DETECTED NOT DETECTED   Staphylococcus epidermidis NOT DETECTED NOT DETECTED   Staphylococcus lugdunensis NOT DETECTED NOT DETECTED   Streptococcus species NOT DETECTED NOT DETECTED   Streptococcus agalactiae NOT DETECTED NOT DETECTED   Streptococcus pneumoniae NOT DETECTED NOT DETECTED   Streptococcus pyogenes NOT DETECTED NOT DETECTED   A.calcoaceticus-baumannii NOT DETECTED NOT DETECTED   Bacteroides fragilis NOT DETECTED NOT DETECTED   Enterobacterales DETECTED (A) NOT DETECTED   Enterobacter cloacae complex NOT DETECTED NOT DETECTED   Escherichia coli DETECTED (A) NOT DETECTED   Klebsiella aerogenes NOT DETECTED NOT DETECTED   Klebsiella oxytoca NOT DETECTED NOT DETECTED   Klebsiella pneumoniae NOT DETECTED NOT DETECTED   Proteus species NOT DETECTED NOT DETECTED   Salmonella species NOT DETECTED NOT DETECTED   Serratia marcescens NOT DETECTED NOT DETECTED   Haemophilus influenzae NOT DETECTED NOT DETECTED   Neisseria meningitidis NOT DETECTED NOT  DETECTED   Pseudomonas aeruginosa NOT DETECTED NOT DETECTED   Stenotrophomonas maltophilia NOT DETECTED NOT DETECTED   Candida albicans NOT DETECTED NOT DETECTED   Candida auris NOT DETECTED NOT DETECTED   Candida glabrata NOT DETECTED NOT DETECTED   Candida krusei NOT DETECTED NOT DETECTED   Candida parapsilosis NOT DETECTED NOT DETECTED   Candida tropicalis NOT DETECTED NOT DETECTED   Cryptococcus neoformans/gattii NOT DETECTED NOT DETECTED   CTX-M ESBL DETECTED (A) NOT DETECTED   Carbapenem resistance IMP NOT DETECTED NOT DETECTED   Carbapenem resistance KPC NOT DETECTED NOT DETECTED   Carbapenem resistance NDM NOT DETECTED NOT DETECTED   Carbapenem resist OXA 48 LIKE NOT DETECTED NOT DETECTED   Carbapenem resistance VIM NOT DETECTED NOT DETECTED    Sherre Docker 05/14/2024  1:55 PM

## 2024-05-15 ENCOUNTER — Encounter (HOSPITAL_COMMUNITY): Payer: Self-pay | Admitting: Urology

## 2024-05-15 DIAGNOSIS — N3001 Acute cystitis with hematuria: Secondary | ICD-10-CM | POA: Diagnosis not present

## 2024-05-15 DIAGNOSIS — N9489 Other specified conditions associated with female genital organs and menstrual cycle: Secondary | ICD-10-CM | POA: Diagnosis not present

## 2024-05-15 DIAGNOSIS — N179 Acute kidney failure, unspecified: Secondary | ICD-10-CM | POA: Diagnosis not present

## 2024-05-15 DIAGNOSIS — N1330 Unspecified hydronephrosis: Secondary | ICD-10-CM | POA: Diagnosis not present

## 2024-05-15 LAB — CBC
HCT: 29 % — ABNORMAL LOW (ref 36.0–46.0)
Hemoglobin: 9.6 g/dL — ABNORMAL LOW (ref 12.0–15.0)
MCH: 29.2 pg (ref 26.0–34.0)
MCHC: 33.1 g/dL (ref 30.0–36.0)
MCV: 88.1 fL (ref 80.0–100.0)
Platelets: 94 10*3/uL — ABNORMAL LOW (ref 150–400)
RBC: 3.29 MIL/uL — ABNORMAL LOW (ref 3.87–5.11)
RDW: 16.9 % — ABNORMAL HIGH (ref 11.5–15.5)
WBC: 11.4 10*3/uL — ABNORMAL HIGH (ref 4.0–10.5)
nRBC: 0 % (ref 0.0–0.2)

## 2024-05-15 LAB — COMPREHENSIVE METABOLIC PANEL WITH GFR
ALT: 13 U/L (ref 0–44)
AST: 16 U/L (ref 15–41)
Albumin: 1.9 g/dL — ABNORMAL LOW (ref 3.5–5.0)
Alkaline Phosphatase: 80 U/L (ref 38–126)
Anion gap: 9 (ref 5–15)
BUN: 55 mg/dL — ABNORMAL HIGH (ref 8–23)
CO2: 20 mmol/L — ABNORMAL LOW (ref 22–32)
Calcium: 7.8 mg/dL — ABNORMAL LOW (ref 8.9–10.3)
Chloride: 102 mmol/L (ref 98–111)
Creatinine, Ser: 3.05 mg/dL — ABNORMAL HIGH (ref 0.44–1.00)
GFR, Estimated: 14 mL/min — ABNORMAL LOW (ref >60)
Glucose, Bld: 113 mg/dL — ABNORMAL HIGH (ref 70–99)
Potassium: 4.3 mmol/L (ref 3.5–5.1)
Sodium: 131 mmol/L — ABNORMAL LOW (ref 135–145)
Total Bilirubin: 0.6 mg/dL (ref 0.0–1.2)
Total Protein: 5.5 g/dL — ABNORMAL LOW (ref 6.5–8.1)

## 2024-05-15 LAB — URINE CULTURE: Culture: >=100000 — AB

## 2024-05-15 LAB — GLUCOSE, CAPILLARY
Glucose-Capillary: 104 mg/dL — ABNORMAL HIGH (ref 70–99)
Glucose-Capillary: 120 mg/dL — ABNORMAL HIGH (ref 70–99)

## 2024-05-15 MED ORDER — SENNOSIDES-DOCUSATE SODIUM 8.6-50 MG PO TABS
2.0000 | ORAL_TABLET | Freq: Every day | ORAL | Status: DC
  Administered 2024-05-15 – 2024-05-17 (×3): 2 via ORAL
  Filled 2024-05-15 (×3): qty 2

## 2024-05-15 MED ORDER — POLYETHYLENE GLYCOL 3350 17 G PO PACK
17.0000 g | PACK | Freq: Every day | ORAL | Status: DC
  Administered 2024-05-15: 17 g via ORAL
  Filled 2024-05-15: qty 1

## 2024-05-15 MED ORDER — IPRATROPIUM-ALBUTEROL 0.5-2.5 (3) MG/3ML IN SOLN
3.0000 mL | Freq: Four times a day (QID) | RESPIRATORY_TRACT | Status: DC | PRN
  Administered 2024-05-16: 3 mL via RESPIRATORY_TRACT
  Filled 2024-05-15: qty 3

## 2024-05-15 MED ORDER — BISACODYL 10 MG RE SUPP
10.0000 mg | Freq: Every day | RECTAL | Status: DC | PRN
Start: 2024-05-15 — End: 2024-05-18
  Administered 2024-05-18: 10 mg via RECTAL
  Filled 2024-05-15: qty 1

## 2024-05-15 MED ORDER — CHLORHEXIDINE GLUCONATE CLOTH 2 % EX PADS
6.0000 | MEDICATED_PAD | Freq: Every day | CUTANEOUS | Status: DC
  Administered 2024-05-15 – 2024-05-18 (×4): 6 via TOPICAL

## 2024-05-15 MED ORDER — ONDANSETRON HCL 4 MG/2ML IJ SOLN: 4.0000 mg | Freq: Four times a day (QID) | INTRAMUSCULAR | Status: DC | PRN

## 2024-05-15 NOTE — Progress Notes (Addendum)
 1 Day Post-Op Subjective: Hematuria overnight. Pt was pleasant but fell asleep while speaking with her and her daughter.   Objective: Vital signs in last 24 hours: Temp:  [96.6 F (35.9 C)-98.7 F (37.1 C)] 98.5 F (36.9 C) (05/16 1156) Pulse Rate:  [64-77] 77 (05/16 1156) Resp:  [14-20] 20 (05/16 1156) BP: (115-157)/(55-78) 128/61 (05/16 1156) SpO2:  [93 %-99 %] 96 % (05/16 1156) Weight:  [53 kg] 53 kg (05/16 0344)  Assessment/Plan: # Right hydronephrosis # Right adnexal mass # Atrophic left kidney  Right adnexal mass likely obstructing right ureter. S/p right ureteral stent with Dr. Derrick Fling 05/14/24. Discuss mgmt on an outpt basis. Would benefit from Gyn input +/- tissue biopsy prior to clinic follow up. Long term mgmt depends on prognostication and GOC. Palliative has been consulted.   #AKI Low oral intake compounded by extended NPO time yesterday with low UOP overnight. Her primary kidney is on the affected side. Would suggest volume resuscitating to what degree is medically reasonable. If stent experiences early failure 2/2 extrinsic compression, will require PCNT. Fluid balance will need to be established.  #Hematuria Plavix  now on hold. Old blood in foley bag. Will continue to monitor. Expect this to be self limiting.  Trend labs- Hgb improved today.   Intake/Output from previous day: 05/15 0701 - 05/16 0700 In: 400 [I.V.:400] Out: 140 [Urine:140]  Intake/Output this shift: Total I/O In: 120 [P.O.:120] Out: -   Physical Exam:  General: Alert and oriented CV: No cyanosis Lungs: equal chest rise Gu: foley in place draining dark red urine, old blood.   Lab Results: Recent Labs    05/13/24 0848 05/14/24 0442 05/15/24 0409  HGB 9.9* 8.6* 9.6*  HCT 29.0* 26.4* 29.0*   BMET Recent Labs    05/14/24 0442 05/15/24 0409  NA 131* 131*  K 3.6 4.3  CL 101 102  CO2 20* 20*  GLUCOSE 87 113*  BUN 55* 55*  CREATININE 3.03* 3.05*  CALCIUM  7.8* 7.8*  HGB  8.6* 9.6*  WBC 17.3* 11.4*     Studies/Results: DG C-Arm 1-60 Min Result Date: 05/14/2024 CLINICAL DATA:  Retrograde cystoscopy EXAM: DG C-ARM 1-60 MIN CONTRAST:  Not listed, refer to operative report FLUOROSCOPY: Fluoroscopy Time:  1 minute 5.8 seconds Radiation Exposure Index (if provided by the fluoroscopic device): 10.140 mGy Number of Acquired Spot Images: 2 COMPARISON:  None Available. FINDINGS: Intraoperative fluoroscopic frontal radiographs of the reported right lower quadrant demonstrate retrograde pyelography demonstrating mild to moderate right hydronephrosis on initial images with subsequent placement of a ureteral stent extending from a upper pole compound calyx beyond the inferior margin of the examination. Bladder excluded from view. No extravasation identified. IMPRESSION: Right ureteral stent placement as described above. Electronically Signed   By: Worthy Heads M.D.   On: 05/14/2024 23:21   VAS US  LOWER EXTREMITY VENOUS (DVT) Result Date: 05/14/2024  Lower Venous DVT Study Patient Name:  Christine Leblanc  Date of Exam:   05/14/2024 Medical Rec #: [id number]       Accession #:    [id number] Date of Birth: [birth year]       Patient Gender: F Patient Age:   89 years Exam Location:  [facility] Procedure:      VAS US  LOWER EXTREMITY VENOUS (DVT) Referring Phys: Kimberly Penna --------------------------------------------------------------------------------  Indications: Edema.  Comparison Study: Previous exam on 05/14/2023 & 03/19/2023 were negative for DVT Performing Technologist: Arlyce Berger RVT, RDMS  Examination Guidelines: A complete evaluation includes B-mode imaging,  spectral Doppler, color Doppler, and power Doppler as needed of all accessible portions of each vessel. Bilateral testing is considered an integral part of a complete examination. Limited examinations for reoccurring indications may be performed as noted. The reflux portion of the exam is performed with the patient in  reverse Trendelenburg.  +---------+---------------+---------+-----------+---------------+--------------+ RIGHT    CompressibilityPhasicitySpontaneityProperties     Thrombus Aging +---------+---------------+---------+-----------+---------------+--------------+ CFV      Full           Yes      Yes        wall thickening               +---------+---------------+---------+-----------+---------------+--------------+ SFJ      Full                                                             +---------+---------------+---------+-----------+---------------+--------------+ FV Prox  Full           Yes      Yes                                      +---------+---------------+---------+-----------+---------------+--------------+ FV Mid   Full           Yes      Yes                                      +---------+---------------+---------+-----------+---------------+--------------+ FV DistalFull           Yes      Yes                                      +---------+---------------+---------+-----------+---------------+--------------+ PFV                     Yes      Yes                                      +---------+---------------+---------+-----------+---------------+--------------+ POP      Full           Yes      Yes                                      +---------+---------------+---------+-----------+---------------+--------------+ PTV      Full                                                             +---------+---------------+---------+-----------+---------------+--------------+ PERO     Full                                                             +---------+---------------+---------+-----------+---------------+--------------+   +--------+---------------+---------+-----------+------------+----------------+  LEFT    CompressibilityPhasicitySpontaneityProperties  Thrombus Aging    +--------+---------------+---------+-----------+------------+----------------+ CFV     Full           Yes      Yes        wall                                                                    thickening                   +--------+---------------+---------+-----------+------------+----------------+ SFJ     Full                                                            +--------+---------------+---------+-----------+------------+----------------+ FV Prox Full           Yes      Yes                                     +--------+---------------+---------+-----------+------------+----------------+ FV Mid  Full           Yes      Yes                                     +--------+---------------+---------+-----------+------------+----------------+ FV      Full           Yes      Yes                                     Distal                                                                  +--------+---------------+---------+-----------+------------+----------------+ PFV     Full                                                            +--------+---------------+---------+-----------+------------+----------------+ POP     Full           Yes      Yes                                     +--------+---------------+---------+-----------+------------+----------------+ PTV     Full  Not well                                                                visualized       +--------+---------------+---------+-----------+------------+----------------+ PERO    Full                                           Not well                                                                visualized       +--------+---------------+---------+-----------+------------+----------------+     Summary: BILATERAL: - No evidence of deep vein thrombosis seen in the lower extremities, bilaterally. -No evidence of popliteal  cyst, bilaterally. RIGHT: Subcutaneiys edema noted to lower extremity.   *See table(s) above for measurements and observations. Electronically signed by Delaney Fearing on 05/14/2024 at 6:12:15 PM.    Final    US  RENAL Result Date: 05/13/2024 CLINICAL DATA:  Acute kidney injury EXAM: RENAL / URINARY TRACT ULTRASOUND COMPLETE COMPARISON:  CT scan 05/13/2024 FINDINGS: Right Kidney: Renal measurements: 11.3 by 5.9 by 5.1 cm = volume: 170 a mL. Echogenicity within normal limits. Mild to moderate right hydronephrosis. Left Kidney: Renal measurements: 7.4 by 3.6 by 3.8 cm = volume: 53 mL. Cortical thinning in the left kidney with mild prominence of fatty tissues in renal hilum probably due to thinning of parenchymal tissue. Likely areas of scarring in the left upper to mid kidney for example on image 24 series 1. Moderately hyperechoic left renal parenchyma. No mass or hydronephrosis visualized. Bladder: Bladder not visualized, polyp possibly empty or obscured by bowel. Other: On CT earlier today there is a question of edema tracking along the lesser curvature of the stomach raising the possibility of inflammation, as well as some suspected right gastric adenopathy along the gastrohepatic ligament; these areas are not specifically observed on today's renal ultrasound. Moreover, the right distal ureter did not appear dilated on the earlier CT, with the transition from dilated to nondilated right ureter in the vicinity of the iliac vessel cross over and right ovarian mass. IMPRESSION: 1. Mild to moderate right hydronephrosis. Although the cause is not seen directly on today's renal ultrasound, the transition from dilated to nondilated ureter appear to be in the vicinity of the iliac vessel cross over and right ovarian mass on today's CT. 2. Cortical thinning and volume loss in the left kidney with areas of scarring in the left upper to mid kidney. Moderately hyperechoic left renal parenchyma. 3. On CT earlier today there is  a question of edema tracking along the lesser curvature of the stomach raising the possibility of inflammation, as well as some suspected right gastric adenopathy along the gastrohepatic ligament; these areas are not specifically observed on today's renal ultrasound. Electronically Signed   By: Freida Jes M.D.   On: 05/13/2024 14:53      LOS: 2 days   Alla Ar, NP  [facility] Pager: [phone]  05/15/2024, 12:35 PM

## 2024-05-15 NOTE — Evaluation (Signed)
 Physical Therapy Evaluation Patient Details Name: Christine Leblanc MRN: [id number] DOB: [birth year] Today's Date: 05/15/2024  History of Present Illness  Pt is a 89 y/o F admitted on 05/13/24 after presenting with c/o syncopal episode after standing from toilet. Pt is being treated for complicated UTI & moderate R sided hydronephrosis. Pt underwent cystoscopy with retrograde pyelogram & ureteral stent insertion on 05/14/24. PMH: COPD, HLD, HTN, chronic dysphagia, GERD, hospitalization 5/10-5/12 for AKI & TIA  Clinical Impression  Pt seen for PT evaluation with pt requiring encouragement for participation, daughter present in room. Pt is more confused than baseline, per daughter. Pt is able to complete bed mobility with min assist, sit<>stand with RW & min assist & ambulate in room with RW & min assist. Pt would benefit from ongoing PT services to progress balance & gait with LRAD to reduce fall risk & facilitate return to PLOF (supervision to ambulate household distances).        If plan is discharge home, recommend the following: A little help with walking and/or transfers;A little help with bathing/dressing/bathroom;Assistance with cooking/housework;Assist for transportation;Help with stairs or ramp for entrance;Direct supervision/assist for financial management;Supervision due to cognitive status   Can travel by private vehicle        Equipment Recommendations None recommended by PT  Recommendations for Other Services       Functional Status Assessment Patient has had a recent decline in their functional status and demonstrates the ability to make significant improvements in function in a reasonable and predictable amount of time.     Precautions / Restrictions Precautions Precautions: Fall Restrictions Weight Bearing Restrictions Per Provider Order: No      Mobility  Bed Mobility Overal bed mobility: Needs Assistance Bed Mobility: Supine to Sit     Supine to sit: Min assist, Used  rails, HOB elevated (exit L side of bed, extra time)     General bed mobility comments: assistance to scoot to sitting EOB    Transfers Overall transfer level: Needs assistance Equipment used: Rolling walker (2 wheels) Transfers: Sit to/from Stand Sit to Stand: Min assist           General transfer comment: sit<>stand from EOB & recliner with RW & min assist, cuing re: hand placement    Ambulation/Gait Ambulation/Gait assistance: Min assist Gait Distance (Feet): 20 Feet Assistive device: Rolling walker (2 wheels) Gait Pattern/deviations: Decreased step length - left, Decreased step length - right, Decreased stride length, Decreased dorsiflexion - right, Decreased dorsiflexion - left, Trunk flexed Gait velocity: decreased     General Gait Details: Ambulates bed>sink>around bed to recliner with RW & min assist  Stairs            Wheelchair Mobility     Tilt Bed    Modified Rankin (Stroke Patients Only)       Balance Overall balance assessment: Needs assistance Sitting-balance support: Feet supported, Bilateral upper extremity supported Sitting balance-Leahy Scale: Fair     Standing balance support: Bilateral upper extremity supported, During functional activity, Single extremity supported, Reliant on assistive device for balance Standing balance-Leahy Scale: Poor                               Pertinent Vitals/Pain Pain Assessment Pain Assessment: No/denies pain    Home Living Family/patient expects to be discharged to:: Private residence Living Arrangements: Children Available Help at Discharge: Family;Available 24 hours/day Type of Home: House Home Access: Stairs to  enter Entrance Stairs-Rails: None Entrance Stairs-Number of Steps: 1   Home Layout: One level;Laundry or work area in basement;Able to live on main level with bedroom/bathroom Home Equipment: Agricultural consultant (2 wheels);Cane - single point;Grab bars - tub/shower;Shower  seat;BSC/3in1 Additional Comments: information from previous hospitalization    Prior Function Prior Level of Function : Needs assist             Mobility Comments: household ambulation using RW ADLs Comments: assisted by family for bathing and IADLs     Extremity/Trunk Assessment   Upper Extremity Assessment Upper Extremity Assessment: Generalized weakness    Lower Extremity Assessment Lower Extremity Assessment: Generalized weakness    Cervical / Trunk Assessment Cervical / Trunk Assessment: Kyphotic  Communication   Communication Communication: Impaired Factors Affecting Communication: Reduced clarity of speech    Cognition Arousal: Alert Behavior During Therapy: Flat affect   PT - Cognitive impairments: Orientation, History of cognitive impairments, Memory, Initiation, Problem solving, Safety/Judgement                       PT - Cognition Comments: Pt is more confused than baseline per her daughter; pt requires encouragement for OOB mobility Following commands: Impaired Following commands impaired: Follows one step commands inconsistently     Cueing Cueing Techniques: Verbal cues, Tactile cues     General Comments      Exercises     Assessment/Plan    PT Assessment Patient needs continued PT services  PT Problem List Decreased strength;Decreased activity tolerance;Decreased balance;Decreased mobility;Decreased cognition;Decreased range of motion;Decreased knowledge of use of DME;Decreased safety awareness       PT Treatment Interventions DME instruction;Balance training;Modalities;Gait training;Neuromuscular re-education;Stair training;Functional mobility training;Therapeutic activities;Therapeutic exercise;Patient/family education;Manual techniques;Cognitive remediation    PT Goals (Current goals can be found in the Care Plan section)  Acute Rehab PT Goals Patient Stated Goal: get better, go home PT Goal Formulation: With  patient/family Time For Goal Achievement: 05/29/24 Potential to Achieve Goals: Good    Frequency Min 2X/week     Co-evaluation PT/OT/SLP Co-Evaluation/Treatment: Yes Reason for Co-Treatment: Other (comment) (unsure of pt's willingness/ability to tolerate 2 seperate sessions, to maximize pt & therapist safety & maximize mobility during session) PT goals addressed during session: Balance;Mobility/safety with mobility;Proper use of DME         AM-PAC PT "6 Clicks" Mobility  Outcome Measure Help needed turning from your back to your side while in a flat bed without using bedrails?: None Help needed moving from lying on your back to sitting on the side of a flat bed without using bedrails?: A Little Help needed moving to and from a bed to a chair (including a wheelchair)?: A Little Help needed standing up from a chair using your arms (e.g., wheelchair or bedside chair)?: A Little Help needed to walk in hospital room?: A Little Help needed climbing 3-5 steps with a railing? : A Lot 6 Click Score: 18    End of Session   Activity Tolerance: Patient tolerated treatment well;Patient limited by fatigue Patient left: in chair;with chair alarm set;with call bell/phone within reach;with family/visitor present;with nursing/sitter in room Nurse Communication: Mobility status PT Visit Diagnosis: Other abnormalities of gait and mobility (R26.89);Muscle weakness (generalized) (M62.81)    Time: 1610-9604 PT Time Calculation (min) (ACUTE ONLY): 27 min   Charges:   PT Evaluation $PT Eval Moderate Complexity: 1 Mod   PT General Charges $$ ACUTE PT VISIT: 1 Visit  Emaline Handsome, PT, DPT 05/15/24, 12:32 PM   Venetta Gill 05/15/2024, 12:30 PM

## 2024-05-15 NOTE — TOC Progression Note (Addendum)
 Transition of Care [facility]) - Progression Note    Patient Details  Name: Christine Leblanc MRN: [id number] Date of Birth: 11-14-1929  Transition of Care [facility]) CM/SW Contact  Eusebio High, RN Phone Number: 05/15/2024, 11:31 AM  Clinical Narrative:     TOC continuing to follow  Patient from home with Son. Has RW at home. Patient is currently active with [facility] for PT. Patient is currently being recommended OPSLP. RNCM added SLP to [facility] order so patient can continue receiving HH PT services from [facility] as they were prior to admission  Do not anticipate a DC over the weekend Per provider at progression Sensitivities are still pending etc. TOC will continue to follow patient for any discharge needs             Expected Discharge Plan and Services                                               Social Determinants of Health (SDOH) Interventions SDOH Screenings   Food Insecurity: No Food Insecurity (05/13/2024)  Housing: Low Risk  (05/13/2024)  Transportation Needs: No Transportation Needs (05/13/2024)  Utilities: Not At Risk (05/13/2024)  Social Connections: Socially Isolated (05/13/2024)  Tobacco Use: Medium Risk (05/14/2024)    Readmission Risk Interventions    05/14/2024    3:43 PM 05/11/2024   11:01 AM 03/05/2024   12:39 PM  Readmission Risk Prevention Plan  Transportation Screening Complete Complete Complete  HRI or Home Care Consult   Complete  Social Work Consult for Recovery Care Planning/Counseling   Complete  Palliative Care Screening   Not Applicable  Medication Review Oceanographer) Complete Complete Complete  PCP or Specialist appointment within 3-5 days of discharge Complete    HRI or Home Care Consult Complete Complete   SW Recovery Care/Counseling Consult Complete Complete   Palliative Care Screening Not Applicable Not Applicable   Skilled Nursing Facility Not Applicable Not Applicable

## 2024-05-15 NOTE — Evaluation (Signed)
 Occupational Therapy Evaluation Patient Details Name: Christine Leblanc MRN: [id number] DOB: [birth year] Today's Date: 05/15/2024   History of Present Illness   Pt is a 89 y/o F admitted on 05/13/24 after presenting with c/o syncopal episode after standing from toilet. Pt is being treated for complicated UTI & moderate R sided hydronephrosis. Pt underwent cystoscopy with retrograde pyelogram & ureteral stent insertion on 05/14/24. PMH: COPD, HLD, HTN, chronic dysphagia, GERD, hospitalization 5/10-5/12 for AKI & TIA     Clinical Impressions Pt presents with decline in function and safety with ADLs and ADL mobility with impaired strength, balance and endurance. Pt required encouragement for EOB/OOB activity, daughter present in room. Pt is more confused than baseline, per her daughter . PTA pt Ind with UB ADLs, grooming, toileting, assisted by family for LB dressing, bathing and IADLs, household ambulation using RW. Pt has 24/7 assist at home from family at baseline. Pt currently requires min A with UB ADLs, max A with LB ADLs, mod A with toileting and min A with mobility/transfers using RW. Pt would benefit from acute OT services to address impairments to maximize level of function and safety      If plan is discharge home, recommend the following:   A little help with walking and/or transfers;A lot of help with bathing/dressing/bathroom;Assistance with cooking/housework;Assist for transportation;Help with stairs or ramp for entrance;Direct supervision/assist for medications management     Functional Status Assessment   Patient has had a recent decline in their functional status and demonstrates the ability to make significant improvements in function in a reasonable and predictable amount of time.     Equipment Recommendations   None recommended by OT     Recommendations for Other Services         Precautions/Restrictions   Precautions Precautions: Fall Restrictions Weight  Bearing Restrictions Per Provider Order: No     Mobility Bed Mobility Overal bed mobility: Needs Assistance Bed Mobility: Supine to Sit     Supine to sit: Min assist, Used rails, HOB elevated     General bed mobility comments: assistance to scoot to sitting EOB    Transfers Overall transfer level: Needs assistance Equipment used: Rolling walker (2 wheels) Transfers: Sit to/from Stand Sit to Stand: Min assist                  Balance Overall balance assessment: Needs assistance Sitting-balance support: Feet supported, Bilateral upper extremity supported Sitting balance-Leahy Scale: Fair     Standing balance support: Bilateral upper extremity supported, During functional activity, Single extremity supported, Reliant on assistive device for balance Standing balance-Leahy Scale: Poor                             ADL either performed or assessed with clinical judgement   ADL Overall ADL's : Needs assistance/impaired Eating/Feeding: Set up;Sitting   Grooming: Wash/dry hands;Wash/dry face;Contact guard assist;Standing   Upper Body Bathing: Minimal assistance;Contact guard assist;Sitting   Lower Body Bathing: Maximal assistance   Upper Body Dressing : Minimal assistance;Contact guard assist;Sitting   Lower Body Dressing: Maximal assistance   Toilet Transfer: Minimal assistance;Rolling walker (2 wheels);BSC/3in1;Ambulation   Toileting- Clothing Manipulation and Hygiene: Moderate assistance       Functional mobility during ADLs: Minimal assistance;Rolling walker (2 wheels);Cueing for safety       Vision Baseline Vision/History: 1 Wears glasses Ability to See in Adequate Light: 0 Adequate Patient Visual Report: No change from baseline  Perception         Praxis         Pertinent Vitals/Pain Pain Assessment Pain Assessment: No/denies pain     Extremity/Trunk Assessment Upper Extremity Assessment Upper Extremity Assessment:  Generalized weakness;Right hand dominant   Lower Extremity Assessment Lower Extremity Assessment: Defer to PT evaluation   Cervical / Trunk Assessment Cervical / Trunk Assessment: Kyphotic   Communication Communication Communication: Impaired Factors Affecting Communication: Reduced clarity of speech   Cognition Arousal: Alert Behavior During Therapy: Flat affect                                 Following commands: Impaired Following commands impaired: Follows one step commands inconsistently     Cueing  General Comments   Cueing Techniques: Verbal cues;Tactile cues      Exercises     Shoulder Instructions      Home Living Family/patient expects to be discharged to:: Private residence Living Arrangements: Children Available Help at Discharge: Family;Available 24 hours/day Type of Home: House Home Access: Stairs to enter [employer] of Steps: 1 Entrance Stairs-Rails: None Home Layout: One level;Laundry or work area in basement;Able to live on main level with bedroom/bathroom     Bathroom Shower/Tub: Chief Strategy Officer: Standard Bathroom Accessibility: Yes   Home Equipment: Agricultural consultant (2 wheels);Cane - single point;Grab bars - tub/shower;Shower seat;BSC/3in1   Additional Comments: information from previous hospitalization      Prior Functioning/Environment Prior Level of Function : Needs assist             Mobility Comments: household ambulation using RW ADLs Comments: Pt Ind with UB ADLs, grooming, toileting, assisted by family for LB dressing, bathing and IADLs    OT Problem List: Decreased strength;Decreased activity tolerance;Impaired balance (sitting and/or standing)   OT Treatment/Interventions: Self-care/ADL training;Patient/family education;Balance training;Therapeutic activities;DME and/or AE instruction      OT Goals(Current goals can be found in the care plan section)   Acute Rehab OT  Goals Patient Stated Goal: go home OT Goal Formulation: With patient/family Time For Goal Achievement: 05/29/24 Potential to Achieve Goals: Good ADL Goals Pt Will Perform Grooming: with supervision;with set-up;standing;with caregiver independent in assisting Pt Will Perform Upper Body Bathing: with contact guard assist;with supervision;sitting;with caregiver independent in assisting Pt Will Perform Upper Body Dressing: with contact guard assist;with supervision;sitting;with caregiver independent in assisting Pt Will Transfer to Toilet: with contact guard assist;with supervision;ambulating Pt Will Perform Toileting - Clothing Manipulation and hygiene: with min assist;with contact guard assist;sit to/from stand;sitting/lateral leans;with caregiver independent in assisting   OT Frequency:  Min 2X/week    Co-evaluation PT/OT/SLP Co-Evaluation/Treatment: Yes Reason for Co-Treatment: To address functional/ADL transfers PT goals addressed during session: Balance;Mobility/safety with mobility;Proper use of DME OT goals addressed during session: ADL's and self-care;Proper use of Adaptive equipment and DME      AM-PAC OT "6 Clicks" Daily Activity     Outcome Measure Help from another person eating meals?: None Help from another person taking care of personal grooming?: A Little Help from another person toileting, which includes using toliet, bedpan, or urinal?: A Lot Help from another person bathing (including washing, rinsing, drying)?: A Lot Help from another person to put on and taking off regular upper body clothing?: A Little Help from another person to put on and taking off regular lower body clothing?: A Lot 6 Click Score: 16   End of Session Equipment Utilized During  Treatment: Rolling walker (2 wheels);Gait belt;Other (comment) [facility]) Nurse Communication: Mobility status  Activity Tolerance: Patient limited by fatigue Patient left: with call bell/phone within reach;with  family/visitor present;in chair;with chair alarm set  OT Visit Diagnosis: Muscle weakness (generalized) (M62.81);Other symptoms and signs involving the nervous system (R29.898)                Time: 1610-9604 OT Time Calculation (min): 27 min Charges:  OT General Charges $OT Visit: 1 Visit OT Evaluation $OT Eval Moderate Complexity: 1 Mod    Randy, Rita 05/15/2024, 12:51 PM

## 2024-05-15 NOTE — Plan of Care (Signed)
   Problem: Education: Goal: Knowledge of General Education information will improve Description: Including pain rating scale, medication(s)/side effects and non-pharmacologic comfort measures Outcome: Progressing   Problem: Clinical Measurements: Goal: Ability to maintain clinical measurements within normal limits will improve Outcome: Progressing

## 2024-05-15 NOTE — Progress Notes (Addendum)
 PROGRESS NOTE        PATIENT DETAILS Name: Christine Leblanc Age: 89 y.o. Sex: female Date of Birth: 1929/12/14 Admit Date: 05/13/2024 Admitting Physician Christine Second, MD QMV:HQIO, Christine Raveling, MD  Brief Summary: Patient is a 89 y.o.  female with history of COPD, HLD, HTN, chronic dysphagia, GERD who was hospitalized at [facility] from 5/10-5/12-4 AKI and TIA (MRI brain negative for CVA)-presented to the hospitalist service-for syncopal episode (after standing from toilet.).  Upon further evaluation-patient was found to have complicated UTI-ESBL E. coli bacteremia in a setting of right hydronephrosis due to probable extrinsic compression from a right adnexal mass.  Patient was evaluated by urology-underwent cystoscopy and stent placement on 5/15.  Significant events: 5/14>> admit to [facility]  Significant studies: 5/11>> echo: EF 70-75%, grade 1 diastolic dysfunction. 5/14>> CT head: No acute abnormality. 5/14>> CXR: No PNA 5/14>> CT chest/abdomen/pelvis: Mild/moderate right hydronephrosis, 3.9 x 5.3 cm lesion in the right adnexa. 5/14>> renal ultrasound: Mild to moderate right hydronephrosis 5/15>> renal ultrasound: No evidence of DVT bilaterally.  Significant microbiology data: 5/14>> blood culture: E. coli-sensitivity pending 5/14>> urine culture: E. coli-ESBL  Procedures: 5/15>>Cystoscopy with right retrograde pyelogram and right ureteral stent placement   Consults: Urology  Subjective: Hematuria overnight-but otherwise lying comfortably in bed.  Objective: Vitals: Blood pressure (!) 142/75, pulse 69, temperature (!) 96.8 F (36 C), temperature source Axillary, resp. rate 15, height 5\' 1"  (1.549 m), weight 53 kg, SpO2 98%.   Exam: Gen Exam:Alert awake-not in any distress HEENT:atraumatic, normocephalic Chest: B/L clear to auscultation anteriorly CVS:S1S2 regular Abdomen:soft non tender, non distended Extremities:no edema Neurology: Non focal-generalized  weakness. Skin: no rash  Pertinent Labs/Radiology:    Latest Ref Rng & Units 05/15/2024    4:09 AM 05/14/2024    4:42 AM 05/13/2024    8:48 AM  CBC  WBC 4.0 - 10.5 K/uL 11.4  17.3    Hemoglobin 12.0 - 15.0 g/dL 9.6  8.6  9.9   Hematocrit 36.0 - 46.0 % 29.0  26.4  29.0   Platelets 150 - 400 K/uL 94  79      Lab Results  Component Value Date   NA 131 (L) 05/15/2024   K 4.3 05/15/2024   CL 102 05/15/2024   CO2 20 (L) 05/15/2024     Assessment/Plan: Syncope Probably orthostatic or micturition syncope (occurred after she used bathroom and stood up) However D-dimer significantly elevated-PE possible-unable to do CTA given kidney function-Dopplers negative for DVT-given lack of hypoxia or shortness of breath-doubt PE at this point.    ESBL E. coli UTI with gram-negative bacteremia In the setting of right-sided hydronephrosis due to presumed extrinsic compression from right adnexal mass-has been switched to meropenem on 5/15. Improving-afebrile-leukocytosis has almost resolved.  Moderate right-sided hydronephrosis Suspected extrinsic compression from right adnexal mass-no obvious stone/clots seen on CT imaging. Evaluated by urology on 5/15-cystoscopy/right ureteral stent placed. Urology following.    Hematuria Occurred overnight Foley catheter in place Holding aspirin /Plavix  Await further recommendations from urology.  AKI Not exactly clear if she has had CKD prior to her recent hospitalization-prior levels of creatinine were essentially normal. AKI is multifactorial at this point-likely due to dehydration (poor oral intake)-UTI/bacteremia and underlying hydronephrosis. S/p cystoscopy-right ureteral stent placement Creatinine levels have plateaued-hopefully they will start trending down soon Continue supportive care-avoid nephrotoxic agents.  Normocytic anemia Likely  due to AKI/acute illness-no evidence of blood loss Follow CBC  Thrombocytopenia Mild Probably due to  gram-negative bacteremia Follow CBC-no evidence of bleeding currently.  Minimally elevated troponin Secondary to demand ischemia-type II non-STEMI Trend is flat-clinically insignificant without any anginal symptoms Furthermore-elderly patient-has ongoing AKI-clearly not a candidate for any advanced/aggressive therapies.  3.9 x 5.3 cm right adnexal lesion Likely causing mass effect and hydronephrosis Discussed with daughter on 5/16-given age/frailty-we have agreed not to pursue any further workup at this point.  She will discuss with rest of the family members and let us  know if she changes her mind.  She understands that further workup will not result in change in management-and will be for diagnostic purposes.  Hypothyroidism Synthroid   History of recent TIA Nonfocal exam Aspirin /Plavix  held secondary to hematuria (per prior DC summary on 5/12-aspirin  Plavix  x 21 days followed by aspirin ) Continue statin.  HTN BP currently stable without the use of any antihypertensive Resume when able  GERD PPI  Debility/deconditioning/dysphagia PT/OT/SLP eval Continue to 1 diet with thin liquids.  Palliative care DNR in place Given advanced age/frailty-not a candidate for aggressive therapies. See above documentation regarding right adnexal lesion Palliative care evaluation pending.  Nutrition Status: Nutrition Problem: Severe Malnutrition Etiology: chronic illness (COPD) Signs/Symptoms: severe muscle depletion, severe fat depletion Interventions: MVI, Boost Breeze, Magic cup    Code status:   Code Status: Limited: Do not attempt resuscitation (DNR) -DNR-LIMITED -Do Not Intubate/DNI    DVT Prophylaxis: Place and maintain sequential compression device Start: 05/13/24 1421 Unable to use pharmacological prophylaxis secondary to hematuria.   Family Communication: Daughter-Christine Leblanc-[phone]-at bedside on 5/16   Disposition Plan: Status is: Inpatient Remains inpatient  appropriate because: Severity of illness   Planned Discharge Destination:Home health SNF   Diet: Diet Order             DIET - DYS 1 Room service appropriate? Yes with Assist; Fluid consistency: Thin  Diet effective now                     Antimicrobial agents: Anti-infectives (From admission, onward)    Start     Dose/Rate Route Frequency Ordered Stop   05/14/24 1200  cefTRIAXone  (ROCEPHIN ) 1 g in sodium chloride  0.9 % 100 mL IVPB  Status:  Discontinued        1 g 200 mL/hr over 30 Minutes Intravenous Every 24 hours 05/13/24 1413 05/14/24 1020   05/14/24 1115  meropenem (MERREM) 500 mg in sodium chloride  0.9 % 100 mL IVPB        500 mg 200 mL/hr over 30 Minutes Intravenous Every 12 hours 05/14/24 1020     05/13/24 1200  cefTRIAXone  (ROCEPHIN ) 1 g in sodium chloride  0.9 % 100 mL IVPB        1 g 200 mL/hr over 30 Minutes Intravenous  Once 05/13/24 1151 05/13/24 1428        MEDICATIONS: Scheduled Meds:  Chlorhexidine  Gluconate Cloth  6 each Topical Daily   feeding supplement  1 Container Oral TID BM   levothyroxine   88 mcg Oral Once per day on Monday Tuesday Wednesday Thursday Friday Saturday   And   [START ON 05/17/2024] levothyroxine   44 mcg Oral Every Sunday   multivitamin with minerals  1 tablet Oral Daily   pantoprazole   40 mg Oral BID AC   pravastatin   40 mg Oral Daily   Continuous Infusions:  meropenem (MERREM) IV 500 mg (05/15/24 0939)   PRN Meds:.acetaminophen  **OR** acetaminophen ,  albuterol    I have personally reviewed following labs and imaging studies  LABORATORY DATA: CBC: Recent Labs  Lab 05/09/24 1301 05/10/24 1744 05/13/24 0835 05/13/24 0847 05/13/24 0848 05/14/24 0442 05/15/24 0409  WBC 17.2* 16.0* 13.7*  --   --  17.3* 11.4*  NEUTROABS 15.5*  --  11.4*  --   --   --   --   HGB 10.1* 9.3* 9.3* 9.2* 9.9* 8.6* 9.6*  HCT 31.6* 28.2* 28.5* 27.0* 29.0* 26.4* 29.0*  MCV 92.1 89.8 89.6  --   --  88.0 88.1  PLT 138* 122* 85*  --   --  79*  94*    Basic Metabolic Panel: Recent Labs  Lab 05/09/24 1301 05/11/24 0412 05/13/24 0835 05/13/24 0847 05/13/24 0848 05/14/24 0442 05/15/24 0409  NA 133* 132* 133* 134* 134* 131* 131*  K 3.8 3.6 3.9 3.8 3.8 3.6 4.3  CL 102 105 101  --  102 101 102  CO2 21* 20* 20*  --   --  20* 20*  GLUCOSE 93 127* 107*  --  109* 87 113*  BUN 35* 49* 56*  --  49* 55* 55*  CREATININE 2.61* 2.71* 3.26*  --  3.20* 3.03* 3.05*  CALCIUM  8.5* 7.8* 8.1*  --   --  7.8* 7.8*    GFR: Estimated Creatinine Clearance: 8.9 mL/min (A) (by C-G formula based on SCr of 3.05 mg/dL (H)).  Liver Function Tests: Recent Labs  Lab 05/09/24 1301 05/13/24 0835 05/15/24 0409  AST 36 29 16  ALT 16 17 13   ALKPHOS 94 98 80  BILITOT 1.1 0.6 0.6  PROT 6.6 5.4* 5.5*  ALBUMIN 2.9* 2.0* 1.9*   No results for input(s): "LIPASE", "AMYLASE" in the last 168 hours. Recent Labs  Lab 05/13/24 0855  AMMONIA 17    Coagulation Profile: Recent Labs  Lab 05/09/24 1301  INR 1.4*    Cardiac Enzymes: No results for input(s): "CKTOTAL", "CKMB", "CKMBINDEX", "TROPONINI" in the last 168 hours.  BNP (last 3 results) No results for input(s): "PROBNP" in the last 8760 hours.  Lipid Profile: No results for input(s): "CHOL", "HDL", "LDLCALC", "TRIG", "CHOLHDL", "LDLDIRECT" in the last 72 hours.  Thyroid  Function Tests: Recent Labs    05/13/24 0835  TSH 2.923    Anemia Panel: Recent Labs    05/13/24 1700  FERRITIN 274  TIBC 141*  IRON 8*    Urine analysis:    Component Value Date/Time   COLORURINE AMBER (A) 05/13/2024 1037   APPEARANCEUR TURBID (A) 05/13/2024 1037   LABSPEC 1.016 05/13/2024 1037   PHURINE 5.0 05/13/2024 1037   GLUCOSEU NEGATIVE 05/13/2024 1037   HGBUR MODERATE (A) 05/13/2024 1037   BILIRUBINUR NEGATIVE 05/13/2024 1037   BILIRUBINUR negative 12/14/2023 1454   KETONESUR 5 (A) 05/13/2024 1037   PROTEINUR 100 (A) 05/13/2024 1037   UROBILINOGEN 0.2 12/14/2023 1454   UROBILINOGEN 0.2  08/02/2012 1603   NITRITE POSITIVE (A) 05/13/2024 1037   LEUKOCYTESUR LARGE (A) 05/13/2024 1037    Sepsis Labs: Lactic Acid, Venous    Component Value Date/Time   LATICACIDVEN 1.5 05/13/2024 0849    MICROBIOLOGY: Recent Results (from the past 240 hours)  Blood Culture (Routine X 2)     Status: Abnormal (Preliminary result)   Collection Time: 05/13/24  8:16 AM   Specimen: BLOOD  Result Value Ref Range Status   Specimen Description BLOOD SITE NOT SPECIFIED  Final   Special Requests   Final    BOTTLES DRAWN AEROBIC AND ANAEROBIC  Blood Culture adequate volume   Culture  Setup Time   Final    GRAM NEGATIVE RODS AEROBIC BOTTLE ONLY CRITICAL RESULT CALLED TO, READ BACK BY AND VERIFIED WITH: J .FRENS PHARMD, AT 1012 05/14/24 D. VANHOOK    Culture (A)  Final    ESCHERICHIA COLI SUSCEPTIBILITIES TO FOLLOW Performed at [facility], [address], [place], [place] [address]    Report Status PENDING  Incomplete  Blood Culture ID Panel (Reflexed)     Status: Abnormal   Collection Time: 05/13/24  8:16 AM  Result Value Ref Range Status   Enterococcus faecalis NOT DETECTED NOT DETECTED Final   Enterococcus Faecium NOT DETECTED NOT DETECTED Final   Listeria monocytogenes NOT DETECTED NOT DETECTED Final   Staphylococcus species NOT DETECTED NOT DETECTED Final   Staphylococcus aureus (BCID) NOT DETECTED NOT DETECTED Final   Staphylococcus epidermidis NOT DETECTED NOT DETECTED Final   Staphylococcus lugdunensis NOT DETECTED NOT DETECTED Final   Streptococcus species NOT DETECTED NOT DETECTED Final   Streptococcus agalactiae NOT DETECTED NOT DETECTED Final   Streptococcus pneumoniae NOT DETECTED NOT DETECTED Final   Streptococcus pyogenes NOT DETECTED NOT DETECTED Final   A.calcoaceticus-baumannii NOT DETECTED NOT DETECTED Final   Bacteroides fragilis NOT DETECTED NOT DETECTED Final   Enterobacterales DETECTED (A) NOT DETECTED Final    Comment: Enterobacterales represent a large  order of gram negative bacteria, not a single organism. CRITICAL RESULT CALLED TO, READ BACK BY AND VERIFIED WITH: J .FRENS PHARMD, AT 1012 05/14/24 D. VANHOOK    Enterobacter cloacae complex NOT DETECTED NOT DETECTED Final   Escherichia coli DETECTED (A) NOT DETECTED Final    Comment: CRITICAL RESULT CALLED TO, READ BACK BY AND VERIFIED WITH: J .FRENS PHARMD, AT 1012 05/14/24 D. VANHOOK    Klebsiella aerogenes NOT DETECTED NOT DETECTED Final   Klebsiella oxytoca NOT DETECTED NOT DETECTED Final   Klebsiella pneumoniae NOT DETECTED NOT DETECTED Final   Proteus species NOT DETECTED NOT DETECTED Final   Salmonella species NOT DETECTED NOT DETECTED Final   Serratia marcescens NOT DETECTED NOT DETECTED Final   Haemophilus influenzae NOT DETECTED NOT DETECTED Final   Neisseria meningitidis NOT DETECTED NOT DETECTED Final   Pseudomonas aeruginosa NOT DETECTED NOT DETECTED Final   Stenotrophomonas maltophilia NOT DETECTED NOT DETECTED Final   Candida albicans NOT DETECTED NOT DETECTED Final   Candida auris NOT DETECTED NOT DETECTED Final   Candida glabrata NOT DETECTED NOT DETECTED Final   Candida krusei NOT DETECTED NOT DETECTED Final   Candida parapsilosis NOT DETECTED NOT DETECTED Final   Candida tropicalis NOT DETECTED NOT DETECTED Final   Cryptococcus neoformans/gattii NOT DETECTED NOT DETECTED Final   CTX-M ESBL DETECTED (A) NOT DETECTED Final    Comment: CRITICAL RESULT CALLED TO, READ BACK BY AND VERIFIED WITH: J .FRENS PHARMD, AT 1012 05/14/24 D. VANHOOK (NOTE) Extended spectrum beta-lactamase detected. Recommend a carbapenem as initial therapy.      Carbapenem resistance IMP NOT DETECTED NOT DETECTED Final   Carbapenem resistance KPC NOT DETECTED NOT DETECTED Final   Carbapenem resistance NDM NOT DETECTED NOT DETECTED Final   Carbapenem resist OXA 48 LIKE NOT DETECTED NOT DETECTED Final   Carbapenem resistance VIM NOT DETECTED NOT DETECTED Final    Comment: Performed at [facility], [address] [place], [place] [address]  Blood Culture (Routine X 2)     Status: None (Preliminary result)   Collection Time: 05/13/24  8:40 AM   Specimen: BLOOD  Result Value Ref Range Status   Specimen Description BLOOD SITE NOT SPECIFIED  Final   Special Requests   Final    BOTTLES DRAWN AEROBIC AND ANAEROBIC Blood Culture results may not be optimal due to an inadequate volume of blood received in culture bottles   Culture   Final    NO GROWTH 2 DAYS Performed at [facility], [address] [place], [place] [address]    Report Status PENDING  Incomplete  Urine Culture     Status: Abnormal   Collection Time: 05/13/24 10:37 AM   Specimen: Urine, Random  Result Value Ref Range Status   Specimen Description URINE, RANDOM  Final   Special Requests   Final    NONE Reflexed from [id number] Performed at [facility], [address], [place], [place] [address]    Culture (A)  Final    >=100,000 COLONIES/mL ESCHERICHIA COLI Confirmed Extended Spectrum Beta-Lactamase Producer (ESBL).  In bloodstream infections from ESBL organisms, carbapenems are preferred over piperacillin/tazobactam. They are shown to have a lower risk of mortality.    Report Status 05/15/2024 FINAL  Final   Organism ID, Bacteria ESCHERICHIA COLI (A)  Final      Susceptibility   Escherichia coli - MIC*    AMPICILLIN >=32 RESISTANT Resistant     CEFAZOLIN  >=64 RESISTANT Resistant     CEFEPIME 16 RESISTANT Resistant     CEFTRIAXONE  >=64 RESISTANT Resistant     CIPROFLOXACIN >=4 RESISTANT Resistant     GENTAMICIN >=16 RESISTANT Resistant     IMIPENEM <=0.25 SENSITIVE Sensitive     NITROFURANTOIN 128 RESISTANT Resistant     TRIMETH/SULFA >=320 RESISTANT Resistant     AMPICILLIN/SULBACTAM >=32 RESISTANT Resistant     PIP/TAZO 8 SENSITIVE Sensitive ug/mL    * >=100,000 COLONIES/mL ESCHERICHIA COLI    RADIOLOGY STUDIES/RESULTS: DG C-Arm 1-60 Min Result Date: 05/14/2024 CLINICAL DATA:   Retrograde cystoscopy EXAM: DG C-ARM 1-60 MIN CONTRAST:  Not listed, refer to operative report FLUOROSCOPY: Fluoroscopy Time:  1 minute 5.8 seconds Radiation Exposure Index (if provided by the fluoroscopic device): 10.140 mGy Number of Acquired Spot Images: 2 COMPARISON:  None Available. FINDINGS: Intraoperative fluoroscopic frontal radiographs of the reported right lower quadrant demonstrate retrograde pyelography demonstrating mild to moderate right hydronephrosis on initial images with subsequent placement of a ureteral stent extending from a upper pole compound calyx beyond the inferior margin of the examination. Bladder excluded from view. No extravasation identified. IMPRESSION: Right ureteral stent placement as described above. Electronically Signed   By: Worthy Heads M.D.   On: 05/14/2024 23:21   VAS US  LOWER EXTREMITY VENOUS (DVT) Result Date: 05/14/2024  Lower Venous DVT Study Patient Name:  Christine Leblanc  Date of Exam:   05/14/2024 Medical Rec #: [id number]       Accession #:    [id number] Date of Birth: [birth year]       Patient Gender: F Patient Age:   20 years Exam Location:  [facility] Procedure:      VAS US  LOWER EXTREMITY VENOUS (DVT) Referring Phys: Kimberly Penna --------------------------------------------------------------------------------  Indications: Edema.  Comparison Study: Previous exam on 05/14/2023 & 03/19/2023 were negative for DVT Performing Technologist: Arlyce Berger RVT, RDMS  Examination Guidelines: A complete evaluation includes B-mode imaging, spectral Doppler, color Doppler, and power Doppler as needed of all accessible portions of each vessel. Bilateral testing is considered an integral part of a complete examination. Limited examinations for reoccurring indications may be performed as  noted. The reflux portion of the exam is performed with the patient in reverse Trendelenburg.  +---------+---------------+---------+-----------+---------------+--------------+ RIGHT     CompressibilityPhasicitySpontaneityProperties     Thrombus Aging +---------+---------------+---------+-----------+---------------+--------------+ CFV      Full           Yes      Yes        wall thickening               +---------+---------------+---------+-----------+---------------+--------------+ SFJ      Full                                                             +---------+---------------+---------+-----------+---------------+--------------+ FV Prox  Full           Yes      Yes                                      +---------+---------------+---------+-----------+---------------+--------------+ FV Mid   Full           Yes      Yes                                      +---------+---------------+---------+-----------+---------------+--------------+ FV DistalFull           Yes      Yes                                      +---------+---------------+---------+-----------+---------------+--------------+ PFV                     Yes      Yes                                      +---------+---------------+---------+-----------+---------------+--------------+ POP      Full           Yes      Yes                                      +---------+---------------+---------+-----------+---------------+--------------+ PTV      Full                                                             +---------+---------------+---------+-----------+---------------+--------------+ PERO     Full                                                             +---------+---------------+---------+-----------+---------------+--------------+   +--------+---------------+---------+-----------+------------+----------------+ LEFT    CompressibilityPhasicitySpontaneityProperties  Thrombus Aging   +--------+---------------+---------+-----------+------------+----------------+ CFV  Full           Yes      Yes        wall                                                                     thickening                   +--------+---------------+---------+-----------+------------+----------------+ SFJ     Full                                                            +--------+---------------+---------+-----------+------------+----------------+ FV Prox Full           Yes      Yes                                     +--------+---------------+---------+-----------+------------+----------------+ FV Mid  Full           Yes      Yes                                     +--------+---------------+---------+-----------+------------+----------------+ FV      Full           Yes      Yes                                     Distal                                                                  +--------+---------------+---------+-----------+------------+----------------+ PFV     Full                                                            +--------+---------------+---------+-----------+------------+----------------+ POP     Full           Yes      Yes                                     +--------+---------------+---------+-----------+------------+----------------+ PTV     Full                                           Not well  visualized       +--------+---------------+---------+-----------+------------+----------------+ PERO    Full                                           Not well                                                                visualized       +--------+---------------+---------+-----------+------------+----------------+     Summary: BILATERAL: - No evidence of deep vein thrombosis seen in the lower extremities, bilaterally. -No evidence of popliteal cyst, bilaterally. RIGHT: Subcutaneiys edema noted to lower extremity.   *See table(s) above for measurements and observations. Electronically signed by Delaney Fearing on 05/14/2024  at 6:12:15 PM.    Final    US  RENAL Result Date: 05/13/2024 CLINICAL DATA:  Acute kidney injury EXAM: RENAL / URINARY TRACT ULTRASOUND COMPLETE COMPARISON:  CT scan 05/13/2024 FINDINGS: Right Kidney: Renal measurements: 11.3 by 5.9 by 5.1 cm = volume: 170 a mL. Echogenicity within normal limits. Mild to moderate right hydronephrosis. Left Kidney: Renal measurements: 7.4 by 3.6 by 3.8 cm = volume: 53 mL. Cortical thinning in the left kidney with mild prominence of fatty tissues in renal hilum probably due to thinning of parenchymal tissue. Likely areas of scarring in the left upper to mid kidney for example on image 24 series 1. Moderately hyperechoic left renal parenchyma. No mass or hydronephrosis visualized. Bladder: Bladder not visualized, polyp possibly empty or obscured by bowel. Other: On CT earlier today there is a question of edema tracking along the lesser curvature of the stomach raising the possibility of inflammation, as well as some suspected right gastric adenopathy along the gastrohepatic ligament; these areas are not specifically observed on today's renal ultrasound. Moreover, the right distal ureter did not appear dilated on the earlier CT, with the transition from dilated to nondilated right ureter in the vicinity of the iliac vessel cross over and right ovarian mass. IMPRESSION: 1. Mild to moderate right hydronephrosis. Although the cause is not seen directly on today's renal ultrasound, the transition from dilated to nondilated ureter appear to be in the vicinity of the iliac vessel cross over and right ovarian mass on today's CT. 2. Cortical thinning and volume loss in the left kidney with areas of scarring in the left upper to mid kidney. Moderately hyperechoic left renal parenchyma. 3. On CT earlier today there is a question of edema tracking along the lesser curvature of the stomach raising the possibility of inflammation, as well as some suspected right gastric adenopathy along the  gastrohepatic ligament; these areas are not specifically observed on today's renal ultrasound. Electronically Signed   By: Freida Jes M.D.   On: 05/13/2024 14:53     LOS: 2 days   Kimberly Penna, MD  [facility]    To contact the attending provider between 7A-7P or the covering provider during after hours 7P-7A, please log into the web site [email or web address] and access using universal [place] password for that web site. If you do not have the password, please call the hospital operator.  05/15/2024, 11:07 AM

## 2024-05-15 NOTE — Progress Notes (Addendum)
@   1610 - Messaged Dr. Michell Ahumada to let them know that patient's foley has only put out a total of 140 mL overnight. Foley output is frank blood. Bladder scan showed 0 mL.   @ [phone] - Paged on-call urology per Dr. Ceasar Codding request. VS stable. This information was relayed back to Dr. Michell Ahumada.  @ [phone] - Spoke with Dr. Derrick Fling on the phone and explained all the above. He said they will check on it. No new orders at this time.

## 2024-05-16 ENCOUNTER — Inpatient Hospital Stay (HOSPITAL_COMMUNITY)

## 2024-05-16 DIAGNOSIS — Z515 Encounter for palliative care: Secondary | ICD-10-CM

## 2024-05-16 DIAGNOSIS — Z7189 Other specified counseling: Secondary | ICD-10-CM

## 2024-05-16 DIAGNOSIS — N9489 Other specified conditions associated with female genital organs and menstrual cycle: Secondary | ICD-10-CM | POA: Diagnosis not present

## 2024-05-16 DIAGNOSIS — R55 Syncope and collapse: Secondary | ICD-10-CM | POA: Diagnosis not present

## 2024-05-16 LAB — BASIC METABOLIC PANEL WITH GFR
Anion gap: 8 (ref 5–15)
BUN: 61 mg/dL — ABNORMAL HIGH (ref 8–23)
CO2: 19 mmol/L — ABNORMAL LOW (ref 22–32)
Calcium: 7.7 mg/dL — ABNORMAL LOW (ref 8.9–10.3)
Chloride: 102 mmol/L (ref 98–111)
Creatinine, Ser: 3.03 mg/dL — ABNORMAL HIGH (ref 0.44–1.00)
GFR, Estimated: 14 mL/min — ABNORMAL LOW (ref >60)
Glucose, Bld: 104 mg/dL — ABNORMAL HIGH (ref 70–99)
Potassium: 3.7 mmol/L (ref 3.5–5.1)
Sodium: 129 mmol/L — ABNORMAL LOW (ref 135–145)

## 2024-05-16 LAB — CULTURE, BLOOD (ROUTINE X 2): Special Requests: ADEQUATE

## 2024-05-16 LAB — CBC
HCT: 24.6 % — ABNORMAL LOW (ref 36.0–46.0)
Hemoglobin: 8.4 g/dL — ABNORMAL LOW (ref 12.0–15.0)
MCH: 29.6 pg (ref 26.0–34.0)
MCHC: 34.1 g/dL (ref 30.0–36.0)
MCV: 86.6 fL (ref 80.0–100.0)
Platelets: 140 10*3/uL — ABNORMAL LOW (ref 150–400)
RBC: 2.84 MIL/uL — ABNORMAL LOW (ref 3.87–5.11)
RDW: 16.7 % — ABNORMAL HIGH (ref 11.5–15.5)
WBC: 17.4 10*3/uL — ABNORMAL HIGH (ref 4.0–10.5)
nRBC: 0 % (ref 0.0–0.2)

## 2024-05-16 LAB — CREATININE, URINE, RANDOM: Creatinine, Urine: 54 mg/dL

## 2024-05-16 LAB — GLUCOSE, CAPILLARY
Glucose-Capillary: 189 mg/dL — ABNORMAL HIGH (ref 70–99)
Glucose-Capillary: 121 mg/dL — ABNORMAL HIGH (ref 70–99)
Glucose-Capillary: 130 mg/dL — ABNORMAL HIGH (ref 70–99)

## 2024-05-16 LAB — SODIUM, URINE, RANDOM: Sodium, Ur: 57 mmol/L

## 2024-05-16 LAB — OSMOLALITY: Osmolality: 300 mosm/kg — ABNORMAL HIGH (ref 275–295)

## 2024-05-16 LAB — OSMOLALITY, URINE: Osmolality, Ur: 372 mosm/kg (ref 300–900)

## 2024-05-16 LAB — URIC ACID: Uric Acid, Serum: 9.5 mg/dL — ABNORMAL HIGH (ref 2.5–7.1)

## 2024-05-16 LAB — BRAIN NATRIURETIC PEPTIDE: B Natriuretic Peptide: 190.1 pg/mL — ABNORMAL HIGH (ref 0.0–100.0)

## 2024-05-16 MED ORDER — POLYETHYLENE GLYCOL 3350 17 G PO PACK
17.0000 g | PACK | Freq: Two times a day (BID) | ORAL | Status: DC
  Administered 2024-05-16 – 2024-05-18 (×6): 17 g via ORAL
  Filled 2024-05-16 (×6): qty 1

## 2024-05-16 MED ORDER — LACTULOSE 10 GM/15ML PO SOLN
30.0000 g | Freq: Two times a day (BID) | ORAL | Status: AC
  Administered 2024-05-16 (×2): 30 g via ORAL
  Filled 2024-05-16: qty 60

## 2024-05-16 MED ORDER — DOCUSATE SODIUM 100 MG PO CAPS
200.0000 mg | ORAL_CAPSULE | Freq: Two times a day (BID) | ORAL | Status: DC
  Administered 2024-05-16 – 2024-05-18 (×4): 200 mg via ORAL
  Filled 2024-05-16 (×4): qty 2

## 2024-05-16 MED ORDER — LACTATED RINGERS IV SOLN: INTRAVENOUS | Status: AC

## 2024-05-16 NOTE — Progress Notes (Addendum)
 Ordered SCD pumps. Awaiting delivery. Supply said they are currently out of SCD pumps so it may take a little while.

## 2024-05-16 NOTE — Consult Note (Signed)
 Palliative Care Consult Note                                  Date: 05/16/2024   Patient Name: Christine Leblanc  DOB: Jun 02, 1929  MRN: [id number]  Age / Sex: 89 y.o., female  PCP: Omie Bickers, MD Referring Physician: Cala Castleman, MD  Reason for Consultation: Establishing goals of care  HPI/Patient Profile: 89 y.o. female  with past medical history of COPD, HLD, HTN, chronic dysphagia, GERD admitted on 05/13/2024 after presenting with c/o syncopal episode after standing from toilet.   Upon further evaluation-patient was found to have complicated UTI-ESBL E. coli bacteremia in a setting of right hydronephrosis due to probable extrinsic compression from a right adnexal mass. Patient was evaluated by urology-underwent cystoscopy and stent placement on 05/14/24.  Past Medical History:  Diagnosis Date   Anemia    Asthma    Baker's cyst    Cancer (HCC)    skin Ca- ? basal cell    Chronic back pain    Chronic neck pain    Degenerative joint disease    Left shoulder; cervical spine, knees & hands    Gastroesophageal reflux disease    Hiatal hernia; distal esophageal web requiring dilatation; gastric polyps; gastritis; refuses colonoscopy   GERD (gastroesophageal reflux disease)    History of stress test 1990's   stress test done under the care of Dr. Macdonald Savoy & Dr. Katheryne Pane, now being followed by Dr. Amanda Jungling- in [place] , recently seen & told to f/U in one yr.    Hyperlipidemia 04/27/2011   Hypertension    Hypothyroidism    Lymphocytic thyroiditis    Mild carotid artery disease (HCC)    Multiple thyroid  nodules 2010   Adenomatous; thyroidectomy in 2010   Orthostasis    Osteoarthritis of right knee 07/27/2014   Pneumonia    hosp. for pneumonia- long time ago    Primary localized osteoarthrosis of right shoulder 06/21/2015   Seizures (HCC)    yes- as a child- & into adult years, states she took med. for them at one time, stopped at  32 yrs. of age    Syncope and collapse 2007   Possible CVA in 2012 with left lower extremity weakness; refused hospitalization; CT-Atrophy and chronic microvascular ischemic change.     Subjective:   I have reviewed medical records including EPIC notes, labs and imaging, received update from Dr. Zelda Hickman, assessed the patient and then met with the patient and her son Porfirio Bristol to discuss diagnosis prognosis, GOC, EOL wishes, disposition and options.  I introduced Palliative Medicine as specialized medical care for people living with serious illness. It focuses on providing relief from symptoms and stress of a serious illness. The goal is to improve quality of life for both the patient and the family.   Patient and family face treatment option decisions, advanced directive decisions, and anticipatory care needs.  Today's Discussion: Patient lying in bed with son at bedside. We discussed the patient's chronic conditions, recent failure to thrive, and new adnexal mass. The patient told me her decrease in function began after a fall in August  2023. Over the last six months she has had 5 ED visits and 5 hospitalizations. Patient shares she is tired from these rehospitalizations.  The patient lives in her home with her son Porfirio Bristol and daughter Avanell Bob. She has four children. She worked as a Engineer, civil (consulting) and moved around a lot because her husband was in the [employer]. She has been using a walker in the home and a transport chair when going to appointments. Her children help her with many ADLs and IADLs. She has had dysphagia the last 7 years.  A discussion was had today regarding advanced directives. The patient has a HCPOA document in her chart. Confirmed DNR/DNI status. We discussed the difference between an aggressive medical intervention path and a comfort care path for this patient. The patient shared that she is leaning towards pursuing a comfort based care path. We discussed what this would look like. The patient  would no longer receive aggressive medical interventions such as continuous vital signs, lab work, radiology testing, or medications not focused on comfort. All care would focus on how the patient is looking and feeling. This would include management of any symptoms that may cause discomfort, pain, shortness of breath, cough, nausea, agitation, anxiety, and/or secretions etc. Symptoms would be managed with medications and other non-pharmacological interventions. We discussed home hospice as an option if she pursues comfort measures. The goal of hospice is the preservation of dignity and quality at the end phases of life.   The patient began dozing off as we were talking. We discussed that it was a lot to hear and process. Robert asked me to call Avanell Bob and update her. I called her and we reviewed the above conversation. We agree to meet tomorrow when both Porfirio Bristol and Avanell Bob are available to continue discussions. I encouraged the patient and her family to continue discussing goals moving forward.  Discussed the importance of continued conversation with family and the medical providers regarding overall plan of care and treatment options, ensuring decisions are within the context of the patient's values and GOCs.  Questions and concerns were addressed. Hard Choices booklet left for review. The family was encouraged to call with questions or concerns. PMT will continue to support holistically.  Review of Systems  Constitutional:  Positive for fatigue.  HENT:  Positive for trouble swallowing.   Neurological:  Positive for weakness.    Objective:   Primary Diagnoses: Present on Admission:  Syncope   Physical Exam Vitals reviewed.  Constitutional:      General: She is not in acute distress. HENT:     Head: Normocephalic and atraumatic.  Cardiovascular:     Rate and Rhythm: Normal rate.  Pulmonary:     Effort: Pulmonary effort is normal.  Skin:    General: Skin is warm and dry.  Neurological:      Mental Status: She is alert and oriented to person, place, and time.  Psychiatric:        Mood and Affect: Mood normal.        Behavior: Behavior normal.        Thought Content: Thought content normal.        Judgment: Judgment normal.     Vital Signs:  BP 138/67 (BP Location: Left Arm)   Pulse 70   Temp 97.7 F (36.5 C) (Oral)   Resp 19   Ht 5\' 1"  (1.549 m)   Wt 52.3 kg   SpO2 96%   BMI 21.79 kg/m    Advanced Care Planning:  Existing Vynca/ACP Documentation: HCPOA  Primary Decision Maker: PATIENT  Code Status/Advance Care Planning: DNR  Assessment & Plan:   SUMMARY OF RECOMMENDATIONS   Continue DNR/DNI Treat the treatable Patient and family considering options moving forward Continued PMT support   Discussed with: bedside RN and Dr. Zelda Hickman  Time Total: 90 minutes    Thank you for allowing us  to participate in the care of SHARICA ROEDEL PMT will continue to support holistically.   Signed by: Joaquim Muir, NP Palliative Medicine Team  Team Phone # [phone] (Nights/Weekends)  05/16/2024, 1:08 PM

## 2024-05-16 NOTE — Progress Notes (Signed)
 Informed by RN that patient has swelling and bruising of her left upper extremity in the antecubital area.  Site is warm to touch and appears to be fluid-filled.  Patient has not had any IVs placed in this area during this admission but possibly might have blood drawn from this area.  Vital signs stable.  Radial pulse intact and no upper extremity weakness, numbness, or tingling.  Chart reviewed, patient is already on meropenem .  Aspirin  and Plavix  were held on 5/15 due to thrombocytopenia and concern for hematuria.  Not on anticoagulation.  Stat CT ordered for further evaluation.

## 2024-05-16 NOTE — Progress Notes (Signed)
 PROGRESS NOTE        PATIENT DETAILS Name: Christine Leblanc Age: 89 y.o. Sex: female Date of Birth: 1929/10/26 Admit Date: 05/13/2024 Admitting Physician Mandy Second, MD ZOX:WRUE, Lethia Raveling, MD  Brief Summary: Patient is a 89 y.o.  female with history of COPD, HLD, HTN, chronic dysphagia, GERD who was hospitalized at [facility] from 5/10-5/12-4 AKI and TIA (MRI brain negative for CVA)-presented to the hospitalist service-for syncopal episode (after standing from toilet.).  Upon further evaluation-patient was found to have complicated UTI-ESBL E. coli bacteremia in a setting of right hydronephrosis due to probable extrinsic compression from a right adnexal mass.  Patient was evaluated by urology-underwent cystoscopy and stent placement on 5/15.  Significant events: 5/14>> admit to [facility]  Significant studies: 5/11>> echo: EF 70-75%, grade 1 diastolic dysfunction. 5/14>> CT head: No acute abnormality. 5/14>> CXR: No PNA 5/14>> CT chest/abdomen/pelvis: Mild/moderate right hydronephrosis, 3.9 x 5.3 cm lesion in the right adnexa. 5/14>> renal ultrasound: Mild to moderate right hydronephrosis 5/15>> renal ultrasound: No evidence of DVT bilaterally.  Significant microbiology data: 5/14>> blood culture: E. coli-sensitivity pending 5/14>> urine culture: E. coli-ESBL  Procedures: 5/15>>Cystoscopy with right retrograde pyelogram and right ureteral stent placement   Consults: Urology Elective care  Subjective:  Seen in bed no headache or chest pain, mild left arm swelling, no abdominal pain, no focal weakness.  No shortness of breath.  Objective: Vitals: Blood pressure (!) 156/75, pulse 74, temperature (!) 97.4 F (36.3 C), temperature source Oral, resp. rate 15, height 5\' 1"  (1.549 m), weight 52.3 kg, SpO2 94%.   Exam:  Awake Alert, No new F.N deficits, Normal affect [place].AT,PERRAL Supple Neck, No JVD,   Symmetrical Chest wall movement, Good air movement  bilaterally, CTAB RRR,No Gallops, Rubs or new Murmurs,  +ve B.Sounds, Abd Soft, No tenderness,   Mild left arm swelling and redness, mostly around the site of the blood draw in the cubital fossa, some soft tissue swelling all throughout the body as well   Assessment/Plan:  Syncope Probably orthostatic or micturition syncope (occurred after she used bathroom and stood up) However D-dimer significantly elevated-PE possible-unable to do CTA given kidney function-Dopplers negative for DVT-given lack of hypoxia or shortness of breath-doubt PE at this point.    ESBL E. coli UTI with gram-negative bacteremia In the setting of right-sided hydronephrosis due to presumed extrinsic compression from right adnexal mass-has been switched to meropenem  on 5/15. Improving-afebrile-leukocytosis has almost resolved.  Continue meropenem  started on 05/14/2024.  Moderate right-sided hydronephrosis Suspected extrinsic compression from right adnexal mass-no obvious stone/clots seen on CT imaging. Evaluated by urology on 5/15-cystoscopy/right ureteral stent placed. Urology following.    Hematuria Occurred overnight Foley catheter in place Holding aspirin /Plavix  Await further recommendations from urology.  AKI with hyponatremia AKI is multifactorial at this point-likely due to dehydration (poor oral intake)-UTI/bacteremia and underlying hydronephrosis. S/p cystoscopy-right ureteral stent placement Initiate IV fluids on 05/16/2024 and monitor.  3.9 x 5.3 cm right adnexal lesion Likely causing mass effect and hydronephrosis Discussed with daughter on 5/16-given age/frailty-we have agreed not to pursue any further workup at this point.  She will discuss with rest of the family members and let us  know if she changes her mind.  She understands that further workup will not result in change in management-and will be for diagnostic purposes.  Palliative care to talk with family  and decide on long-term goals of  care.  Normocytic anemia Likely due to AKI/acute illness-no evidence of blood loss Follow CBC  Thrombocytopenia Mild Probably due to gram-negative bacteremia Follow CBC-no evidence of bleeding currently.  Minimally elevated troponin Secondary to demand ischemia-type II non-STEMI Trend is flat-clinically insignificant without any anginal symptoms Furthermore-elderly patient-has ongoing AKI-clearly not a candidate for any advanced/aggressive therapies.  Hypothyroidism Synthroid   History of recent TIA Nonfocal exam Aspirin /Plavix  held secondary to hematuria (per prior DC summary on 05/11/24 -aspirin  Plavix  x 21 days followed by aspirin ) Continue statin.  HTN BP currently stable without the use of any antihypertensive Resume when able  GERD PPI  Debility/deconditioning/dysphagia PT/OT/SLP eval Continue to 1 diet with thin liquids.  Palliative care DNR in place Palliative care to evaluate for long-term goals of care.  Nutrition Status: Nutrition Problem: Severe Malnutrition Etiology: chronic illness (COPD) Signs/Symptoms: severe muscle depletion, severe fat depletion Interventions: MVI, Boost Breeze, Magic cup    Code status:   Code Status: Limited: Do not attempt resuscitation (DNR) -DNR-LIMITED -Do Not Intubate/DNI    DVT Prophylaxis: Place and maintain sequential compression device Start: 05/13/24 1421 Unable to use pharmacological prophylaxis secondary to hematuria.   Family Communication: Daughter-Lori [phone]-at bedside on 05/15/24 previous MD, son bedside on 05/16/2024   Disposition Plan: Status is: Inpatient Remains inpatient appropriate because: Severity of illness   Planned Discharge Destination:Home health SNF   Diet: Diet Order             DIET - DYS 1 Room service appropriate? Yes with Assist; Fluid consistency: Thin  Diet effective now                    MEDICATIONS: Scheduled Meds:  Chlorhexidine  Gluconate Cloth  6 each  Topical Daily   docusate sodium   200 mg Oral BID   feeding supplement  1 Container Oral TID BM   lactulose  30 g Oral BID   levothyroxine   88 mcg Oral Once per day on Monday Tuesday Wednesday Thursday Friday Saturday   And   [START ON 05/17/2024] levothyroxine   44 mcg Oral Every Sunday   multivitamin with minerals  1 tablet Oral Daily   pantoprazole   40 mg Oral BID AC   polyethylene glycol  17 g Oral BID   pravastatin   40 mg Oral Daily   senna-docusate  2 tablet Oral QHS   Continuous Infusions:  lactated ringers      meropenem  (MERREM ) IV 500 mg (05/16/24 0913)   PRN Meds:.acetaminophen  **OR** acetaminophen , bisacodyl , ipratropium-albuterol , ondansetron  (ZOFRAN ) IV   I have personally reviewed following labs and imaging studies  LABORATORY DATA: CBC: Recent Labs  Lab 05/09/24 1301 05/10/24 1744 05/13/24 0835 05/13/24 0847 05/13/24 0848 05/14/24 0442 05/15/24 0409 05/16/24 0401  WBC 17.2* 16.0* 13.7*  --   --  17.3* 11.4* 17.4*  NEUTROABS 15.5*  --  11.4*  --   --   --   --   --   HGB 10.1* 9.3* 9.3* 9.2* 9.9* 8.6* 9.6* 8.4*  HCT 31.6* 28.2* 28.5* 27.0* 29.0* 26.4* 29.0* 24.6*  MCV 92.1 89.8 89.6  --   --  88.0 88.1 86.6  PLT 138* 122* 85*  --   --  79* 94* 140*    Basic Metabolic Panel: Recent Labs  Lab 05/11/24 0412 05/13/24 0835 05/13/24 0847 05/13/24 0848 05/14/24 0442 05/15/24 0409 05/16/24 0401  NA 132* 133* 134* 134* 131* 131* 129*  K 3.6 3.9 3.8 3.8 3.6 4.3 3.7  CL 105 101  --  102 101 102 102  CO2 20* 20*  --   --  20* 20* 19*  GLUCOSE 127* 107*  --  109* 87 113* 104*  BUN 49* 56*  --  49* 55* 55* 61*  CREATININE 2.71* 3.26*  --  3.20* 3.03* 3.05* 3.03*  CALCIUM  7.8* 8.1*  --   --  7.8* 7.8* 7.7*    GFR: Estimated Creatinine Clearance: 8.9 mL/min (A) (by C-G formula based on SCr of 3.03 mg/dL (H)).  Liver Function Tests: Recent Labs  Lab 05/09/24 1301 05/13/24 0835 05/15/24 0409  AST 36 29 16  ALT 16 17 13   ALKPHOS 94 98 80  BILITOT 1.1  0.6 0.6  PROT 6.6 5.4* 5.5*  ALBUMIN 2.9* 2.0* 1.9*   No results for input(s): "LIPASE", "AMYLASE" in the last 168 hours. Recent Labs  Lab 05/13/24 0855  AMMONIA 17    Coagulation Profile: Recent Labs  Lab 05/09/24 1301  INR 1.4*   Anemia Panel: Recent Labs    05/13/24 1700  FERRITIN 274  TIBC 141*  IRON 8*    Urine analysis:    Component Value Date/Time   COLORURINE AMBER (A) 05/13/2024 1037   APPEARANCEUR TURBID (A) 05/13/2024 1037   LABSPEC 1.016 05/13/2024 1037   PHURINE 5.0 05/13/2024 1037   GLUCOSEU NEGATIVE 05/13/2024 1037   HGBUR MODERATE (A) 05/13/2024 1037   BILIRUBINUR NEGATIVE 05/13/2024 1037   BILIRUBINUR negative 12/14/2023 1454   KETONESUR 5 (A) 05/13/2024 1037   PROTEINUR 100 (A) 05/13/2024 1037   UROBILINOGEN 0.2 12/14/2023 1454   UROBILINOGEN 0.2 08/02/2012 1603   NITRITE POSITIVE (A) 05/13/2024 1037   LEUKOCYTESUR LARGE (A) 05/13/2024 1037    Sepsis Labs: Lactic Acid, Venous    Component Value Date/Time   LATICACIDVEN 1.5 05/13/2024 0849   RADIOLOGY STUDIES/RESULTS: CT FOREARM LEFT WO CONTRAST Result Date: 05/16/2024 CLINICAL DATA:  Mild EXAM: CT OF THE UPPER LEFT EXTREMITY WITHOUT CONTRAST TECHNIQUE: Multidetector CT imaging of the upper left extremity was performed according to the standard protocol. RADIATION DOSE REDUCTION: This exam was performed according to the departmental dose-optimization program which includes automated exposure control, adjustment of the mA and/or kV according to patient size and/or use of iterative reconstruction technique. COMPARISON:  Left shoulder radiographs 07/11/2010, left elbow radiographs 09/10/2008; bone survey 10/18/2023 FINDINGS: LEFT HUMERUS: Bones/Joint/Cartilage There is metallic susceptibility artifact from left shoulder arthroplasty hardware. Within this limitation, no perihardware lucency is seen to indicate hardware failure or loosening. There is diffuse decreased bone mineralization. The cortices  are intact. Moderate medial elbow joint space narrowing and peripheral osteophytosis. Moderate acromioclavicular joint space narrowing with mild subchondral sclerosis and mild peripheral osteophytosis. Ligaments Suboptimally assessed by CT. Muscles and Tendons Mild biceps and brachialis muscle atrophy. No dense collection is seen to suggest a hematoma. Soft tissues There is mild subcutaneous fat edema and swelling seen throughout the partially visualized left chest wall and the left upper arm. More moderate edema and stranding within the posterior left upper arm soft tissues at the level of the distal humeral diaphysis and more distally to the elbow. No walled-off abscess is seen. Only a tiny portion of the lateral left lung is imaged. There are lateral left upper lobe subpleural densities that are grossly similar to 05/13/2024 recent chest CT and 09/30/2018 CT. This is suggestive of scarring. -- LEFT FOREARM: Bones/Joint/Cartilage Diffuse decreased bone mineralization. Moderate radiocarpal, severe triscaphe, and severe thumb carpometacarpal, and moderate rest of the carpometacarpal and intercarpal joint  osteoarthritis including joint space narrowing, subchondral sclerosis, and peripheral osteophytosis. Moderate medial elbow joint space narrowing and peripheral osteophytosis at the trochlea and coronoid process. Ligaments Suboptimally assessed by CT. Muscles and Tendons Mild diffuse muscle atrophy. No dense collection is seen to suggest a hematoma. Soft tissues Mild to moderate volar and mild dorsal subcutaneous fat soft tissue edema and swelling. Incidental note of mild air within a radial forearm IV needle (axial series 4, image 105). No walled-off abscess is seen. IMPRESSION: 1. Mild subcutaneous fat edema and swelling seen throughout the partially visualized left chest wall and the left upper arm. More moderate edema and stranding within the posterior left upper arm soft tissues at the level of the distal  humeral diaphysis and more distally to the elbow. Mild-to-moderate forearm subcutaneous fat edema and swelling. No walled-off abscess is seen. 2. No acute fracture is seen. 3. 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 4. 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 Electronically Signed   By: Bertina Broccoli M.D.   On: 05/16/2024 08:57   CT HUMERUS LEFT WO CONTRAST Result Date: 05/16/2024 CLINICAL DATA:  Mild EXAM: CT OF THE UPPER LEFT EXTREMITY WITHOUT CONTRAST TECHNIQUE: Multidetector CT imaging of the upper left extremity was performed according to the standard protocol. RADIATION DOSE REDUCTION: This exam was performed according to the departmental dose-optimization program which includes automated exposure control, adjustment of the mA and/or kV according to patient size and/or use of iterative reconstruction technique. COMPARISON:  Left shoulder radiographs 07/11/2010, left elbow radiographs 09/10/2008; bone survey 10/18/2023 FINDINGS: LEFT HUMERUS: Bones/Joint/Cartilage There is metallic susceptibility artifact from left shoulder arthroplasty hardware. Within this limitation, no perihardware lucency is seen to indicate hardware failure or loosening. There is diffuse decreased bone mineralization. The cortices are intact. Moderate medial elbow joint space narrowing and peripheral osteophytosis. Moderate acromioclavicular joint space narrowing with mild subchondral sclerosis and mild peripheral osteophytosis. Ligaments Suboptimally assessed by CT. Muscles and Tendons Mild biceps and brachialis muscle atrophy. No dense collection is seen to suggest a hematoma. Soft tissues There is mild subcutaneous fat edema and swelling seen throughout the partially visualized left chest wall and the left upper arm. More moderate edema and stranding within the posterior left upper arm soft tissues at the level of the distal humeral diaphysis and more distally to the elbow. No walled-off abscess is seen.  Only a tiny portion of the lateral left lung is imaged. There are lateral left upper lobe subpleural densities that are grossly similar to 05/13/2024 recent chest CT and 09/30/2018 CT. This is suggestive of scarring. -- LEFT FOREARM: Bones/Joint/Cartilage Diffuse decreased bone mineralization. Moderate radiocarpal, severe triscaphe, and severe thumb carpometacarpal, and moderate rest of the carpometacarpal and intercarpal joint osteoarthritis including joint space narrowing, subchondral sclerosis, and peripheral osteophytosis. Moderate medial elbow joint space narrowing and peripheral osteophytosis at the trochlea and coronoid process. Ligaments Suboptimally assessed by CT. Muscles and Tendons Mild diffuse muscle atrophy. No dense collection is seen to suggest a hematoma. Soft tissues Mild to moderate volar and mild dorsal subcutaneous fat soft tissue edema and swelling. Incidental note of mild air within a radial forearm IV needle (axial series 4, image 105). No walled-off abscess is seen. IMPRESSION: 1. Mild subcutaneous fat edema and swelling seen throughout the partially visualized left chest wall and the left upper arm. More moderate edema and stranding within the posterior left upper arm soft tissues at the level of the distal humeral diaphysis and more distally to the elbow. Mild-to-moderate forearm subcutaneous fat edema  and swelling. No walled-off abscess is seen. 2. No acute fracture is seen. 3. 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 4. 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 Electronically Signed   By: Bertina Broccoli M.D.   On: 05/16/2024 08:57   DG Chest Port 1 View Result Date: 05/16/2024 CLINICAL DATA:  Shortness of breath.  Altered mental status. EXAM: PORTABLE CHEST 1 VIEW COMPARISON:  05/13/2024 FINDINGS: Lungs are hyperexpanded. Cardiopericardial silhouette is at upper limits of normal for size. Interstitial markings are diffusely coarsened with chronic features.  Trace atelectasis or scarring noted at the bases. Bones are diffusely demineralized. Status post bilateral shoulder replacement. Telemetry leads overlie the chest. IMPRESSION: Hyperexpansion with chronic interstitial coarsening. No acute cardiopulmonary findings. Electronically Signed   By: Donnal Fusi M.D.   On: 05/16/2024 08:12   DG C-Arm 1-60 Min Result Date: 05/14/2024 CLINICAL DATA:  Retrograde cystoscopy EXAM: DG C-ARM 1-60 MIN CONTRAST:  Not listed, refer to operative report FLUOROSCOPY: Fluoroscopy Time:  1 minute 5.8 seconds Radiation Exposure Index (if provided by the fluoroscopic device): 10.140 mGy Number of Acquired Spot Images: 2 COMPARISON:  None Available. FINDINGS: Intraoperative fluoroscopic frontal radiographs of the reported right lower quadrant demonstrate retrograde pyelography demonstrating mild to moderate right hydronephrosis on initial images with subsequent placement of a ureteral stent extending from a upper pole compound calyx beyond the inferior margin of the examination. Bladder excluded from view. No extravasation identified. IMPRESSION: Right ureteral stent placement as described above. Electronically Signed   By: Worthy Heads M.D.   On: 05/14/2024 23:21   VAS US  LOWER EXTREMITY VENOUS (DVT) Result Date: 05/14/2024  Lower Venous DVT Study Patient Name:  SHAWNTEE MAINWARING  Date of Exam:   05/14/2024 Medical Rec #: [id number]       Accession #:    [id number] Date of Birth: Feb 07, 1929       Patient Gender: F Patient Age:   53 years Exam Location:  [facility] Procedure:      VAS US  LOWER EXTREMITY VENOUS (DVT) Referring Phys: Kimberly Penna --------------------------------------------------------------------------------  Indications: Edema.  Comparison Study: Previous exam on 05/14/2023 & 03/19/2023 were negative for DVT Performing Technologist: Arlyce Berger RVT, RDMS  Examination Guidelines: A complete evaluation includes B-mode imaging, spectral Doppler, color Doppler, and  power Doppler as needed of all accessible portions of each vessel. Bilateral testing is considered an integral part of a complete examination. Limited examinations for reoccurring indications may be performed as noted. The reflux portion of the exam is performed with the patient in reverse Trendelenburg.  +---------+---------------+---------+-----------+---------------+--------------+ RIGHT    CompressibilityPhasicitySpontaneityProperties     Thrombus Aging +---------+---------------+---------+-----------+---------------+--------------+ CFV      Full           Yes      Yes        wall thickening               +---------+---------------+---------+-----------+---------------+--------------+ SFJ      Full                                                             +---------+---------------+---------+-----------+---------------+--------------+ FV Prox  Full           Yes      Yes                                      +---------+---------------+---------+-----------+---------------+--------------+  FV Mid   Full           Yes      Yes                                      +---------+---------------+---------+-----------+---------------+--------------+ FV DistalFull           Yes      Yes                                      +---------+---------------+---------+-----------+---------------+--------------+ PFV                     Yes      Yes                                      +---------+---------------+---------+-----------+---------------+--------------+ POP      Full           Yes      Yes                                      +---------+---------------+---------+-----------+---------------+--------------+ PTV      Full                                                             +---------+---------------+---------+-----------+---------------+--------------+ PERO     Full                                                              +---------+---------------+---------+-----------+---------------+--------------+   +--------+---------------+---------+-----------+------------+----------------+ LEFT    CompressibilityPhasicitySpontaneityProperties  Thrombus Aging   +--------+---------------+---------+-----------+------------+----------------+ CFV     Full           Yes      Yes        wall                                                                    thickening                   +--------+---------------+---------+-----------+------------+----------------+ SFJ     Full                                                            +--------+---------------+---------+-----------+------------+----------------+ FV Prox Full           Yes      Yes                                     +--------+---------------+---------+-----------+------------+----------------+  FV Mid  Full           Yes      Yes                                     +--------+---------------+---------+-----------+------------+----------------+ FV      Full           Yes      Yes                                     Distal                                                                  +--------+---------------+---------+-----------+------------+----------------+ PFV     Full                                                            +--------+---------------+---------+-----------+------------+----------------+ POP     Full           Yes      Yes                                     +--------+---------------+---------+-----------+------------+----------------+ PTV     Full                                           Not well                                                                visualized       +--------+---------------+---------+-----------+------------+----------------+ PERO    Full                                           Not well                                                                 visualized       +--------+---------------+---------+-----------+------------+----------------+     Summary: BILATERAL: - No evidence of deep vein thrombosis seen in the lower extremities, bilaterally. -No evidence of popliteal cyst, bilaterally. RIGHT: Subcutaneiys edema noted to lower extremity.   *See table(s) above for measurements and observations. Electronically signed by Delaney Fearing on  05/14/2024 at 6:12:15 PM.    Final      LOS: 3 days   Signature  -    Lynnwood Sauer M.D on 05/16/2024 at 10:31 AM   -  To page go to [email or web address]

## 2024-05-16 NOTE — Plan of Care (Signed)
 ?  Problem: Clinical Measurements: ?Goal: Ability to maintain clinical measurements within normal limits will improve ?Outcome: Progressing ?Goal: Will remain free from infection ?Outcome: Progressing ?Goal: Diagnostic test results will improve ?Outcome: Progressing ?  ?

## 2024-05-16 NOTE — Plan of Care (Signed)

## 2024-05-16 NOTE — Progress Notes (Signed)
@   34 - Noticed pt. has +2 to +3 edema in medial part of LAC. Site is bruised but appears to be fluid filled, similar to an infiltrate. Does not appear to be a hematoma. Site is warm but not hot to the touch. Looked at the patient's history and she doesn't appear to have had an IV in the LAC at all this admission. R.N. messaged Dr. Michell Ahumada to make them aware. Photo of site is available in media files.

## 2024-05-17 DIAGNOSIS — Z515 Encounter for palliative care: Secondary | ICD-10-CM | POA: Diagnosis not present

## 2024-05-17 DIAGNOSIS — R55 Syncope and collapse: Secondary | ICD-10-CM | POA: Diagnosis not present

## 2024-05-17 DIAGNOSIS — Z7189 Other specified counseling: Secondary | ICD-10-CM | POA: Diagnosis not present

## 2024-05-17 LAB — CBC WITH DIFFERENTIAL/PLATELET
Abs Immature Granulocytes: 0.13 10*3/uL — ABNORMAL HIGH (ref 0.00–0.07)
Basophils Absolute: 0 10*3/uL (ref 0.0–0.1)
Basophils Relative: 0 %
Eosinophils Absolute: 0.1 10*3/uL (ref 0.0–0.5)
Eosinophils Relative: 1 %
HCT: 25.5 % — ABNORMAL LOW (ref 36.0–46.0)
Hemoglobin: 8.5 g/dL — ABNORMAL LOW (ref 12.0–15.0)
Immature Granulocytes: 1 %
Lymphocytes Relative: 12 %
Lymphs Abs: 1.4 10*3/uL (ref 0.7–4.0)
MCH: 29.4 pg (ref 26.0–34.0)
MCHC: 33.3 g/dL (ref 30.0–36.0)
MCV: 88.2 fL (ref 80.0–100.0)
Monocytes Absolute: 1.2 10*3/uL — ABNORMAL HIGH (ref 0.1–1.0)
Monocytes Relative: 10 %
Neutro Abs: 9 10*3/uL — ABNORMAL HIGH (ref 1.7–7.7)
Neutrophils Relative %: 76 %
Platelets: 125 10*3/uL — ABNORMAL LOW (ref 150–400)
RBC: 2.89 MIL/uL — ABNORMAL LOW (ref 3.87–5.11)
RDW: 16.6 % — ABNORMAL HIGH (ref 11.5–15.5)
WBC: 11.9 10*3/uL — ABNORMAL HIGH (ref 4.0–10.5)
nRBC: 0 % (ref 0.0–0.2)

## 2024-05-17 LAB — BASIC METABOLIC PANEL WITH GFR
Anion gap: 9 (ref 5–15)
BUN: 50 mg/dL — ABNORMAL HIGH (ref 8–23)
CO2: 19 mmol/L — ABNORMAL LOW (ref 22–32)
Calcium: 7.9 mg/dL — ABNORMAL LOW (ref 8.9–10.3)
Chloride: 104 mmol/L (ref 98–111)
Creatinine, Ser: 2.57 mg/dL — ABNORMAL HIGH (ref 0.44–1.00)
GFR, Estimated: 17 mL/min — ABNORMAL LOW (ref >60)
Glucose, Bld: 99 mg/dL (ref 70–99)
Potassium: 4 mmol/L (ref 3.5–5.1)
Sodium: 132 mmol/L — ABNORMAL LOW (ref 135–145)

## 2024-05-17 LAB — MAGNESIUM: Magnesium: 1.8 mg/dL (ref 1.7–2.4)

## 2024-05-17 LAB — GLUCOSE, CAPILLARY
Glucose-Capillary: 126 mg/dL — ABNORMAL HIGH (ref 70–99)
Glucose-Capillary: 122 mg/dL — ABNORMAL HIGH (ref 70–99)

## 2024-05-17 LAB — PHOSPHORUS: Phosphorus: 3.3 mg/dL (ref 2.5–4.6)

## 2024-05-17 LAB — BRAIN NATRIURETIC PEPTIDE: B Natriuretic Peptide: 210.2 pg/mL — ABNORMAL HIGH (ref 0.0–100.0)

## 2024-05-17 LAB — PROCALCITONIN: Procalcitonin: 0.52 ng/mL

## 2024-05-17 MED ORDER — ALUM & MAG HYDROXIDE-SIMETH 200-200-20 MG/5ML PO SUSP
30.0000 mL | Freq: Once | ORAL | Status: AC
  Administered 2024-05-17: 30 mL via ORAL
  Filled 2024-05-17: qty 30

## 2024-05-17 MED ORDER — HYDRALAZINE HCL 20 MG/ML IJ SOLN: 10.0000 mg | Freq: Four times a day (QID) | INTRAMUSCULAR | Status: DC | PRN

## 2024-05-17 MED ORDER — FENTANYL CITRATE PF 50 MCG/ML IJ SOSY
25.0000 ug | PREFILLED_SYRINGE | Freq: Four times a day (QID) | INTRAMUSCULAR | Status: DC | PRN
  Administered 2024-05-17: 25 ug via INTRAVENOUS
  Filled 2024-05-17: qty 1

## 2024-05-17 MED ORDER — CARVEDILOL 6.25 MG PO TABS
6.2500 mg | ORAL_TABLET | Freq: Two times a day (BID) | ORAL | Status: DC
  Administered 2024-05-17 – 2024-05-18 (×2): 6.25 mg via ORAL
  Filled 2024-05-17 (×2): qty 1

## 2024-05-17 MED ORDER — CARVEDILOL 3.125 MG PO TABS
3.1250 mg | ORAL_TABLET | Freq: Once | ORAL | Status: AC
  Administered 2024-05-17: 3.125 mg via ORAL
  Filled 2024-05-17: qty 1

## 2024-05-17 MED ORDER — ISOSORBIDE MONONITRATE ER 30 MG PO TB24
30.0000 mg | ORAL_TABLET | Freq: Every day | ORAL | Status: DC
  Administered 2024-05-17 – 2024-05-18 (×2): 30 mg via ORAL
  Filled 2024-05-17 (×2): qty 1

## 2024-05-17 MED ORDER — CARVEDILOL 3.125 MG PO TABS
3.1250 mg | ORAL_TABLET | Freq: Two times a day (BID) | ORAL | Status: DC
  Administered 2024-05-17: 3.125 mg via ORAL
  Filled 2024-05-17: qty 1

## 2024-05-17 MED ORDER — LIDOCAINE VISCOUS HCL 2 % MT SOLN
15.0000 mL | Freq: Once | OROMUCOSAL | Status: AC
  Administered 2024-05-17: 15 mL via ORAL
  Filled 2024-05-17: qty 15

## 2024-05-17 NOTE — Plan of Care (Signed)

## 2024-05-17 NOTE — Progress Notes (Signed)
 PROGRESS NOTE        PATIENT DETAILS Name: Christine Leblanc Age: 89 y.o. Sex: female Date of Birth: Jun 09, 1929 Admit Date: 05/13/2024 Admitting Physician Mandy Second, MD ZOX:[id number], Lethia Raveling, MD  Brief Summary: Patient is a 89 y.o.  female with history of COPD, HLD, HTN, chronic dysphagia, GERD who was hospitalized at [facility] from 5/10-5/12-4 AKI and TIA (MRI brain negative for CVA)-presented to the hospitalist service-for syncopal episode (after standing from toilet.).  Upon further evaluation-patient was found to have complicated UTI-ESBL E. coli bacteremia in a setting of right hydronephrosis due to probable extrinsic compression from a right adnexal mass.  Patient was evaluated by urology-underwent cystoscopy and stent placement on 5/15.  Significant events: 5/14>> admit to [facility]  Significant studies: 5/11>> echo: EF 70-75%, grade 1 diastolic dysfunction. 5/14>> CT head: No acute abnormality. 5/14>> CXR: No PNA 5/14>> CT chest/abdomen/pelvis: Mild/moderate right hydronephrosis, 3.9 x 5.3 cm lesion in the right adnexa. 5/14>> renal ultrasound: Mild to moderate right hydronephrosis 5/15>> renal ultrasound: No evidence of DVT bilaterally.  Significant microbiology data: 5/14>> blood culture: E. coli-sensitivity pending 5/14>> urine culture: E. coli-ESBL  Procedures: 5/15>>Cystoscopy with right retrograde pyelogram and right ureteral stent placement   Consults: Urology Palliative care  Subjective:  Patient in bed, appears comfortable, denies any headache, no fever, no chest pain or pressure, no shortness of breath , no abdominal pain. No focal weakness.  Objective: Vitals: Blood pressure (!) 156/112, pulse 73, temperature 98.1 F (36.7 C), temperature source Oral, resp. rate 16, height 5\' 1"  (1.549 m), weight 56.9 kg, SpO2 97%.   Exam:  Awake Alert, No new F.N deficits, Normal affect [place].AT,PERRAL Supple Neck, No JVD,   Symmetrical Chest wall  movement, Good air movement bilaterally, CTAB RRR,No Gallops, Rubs or new Murmurs,  +ve B.Sounds, Abd Soft, No tenderness,   Mild left arm swelling and redness, mostly around the site of the blood draw in the cubital fossa, some soft tissue swelling all throughout the body as well   Assessment/Plan:  Syncope Probably orthostatic or micturition syncope (occurred after she used bathroom and stood up) However D-dimer significantly elevated-PE possible-unable to do CTA given kidney function-Dopplers negative for DVT-given lack of hypoxia or shortness of breath-doubt PE at this point.    ESBL E. coli UTI with gram-negative bacteremia In the setting of right-sided hydronephrosis due to presumed extrinsic compression from right adnexal mass-has been switched to meropenem  on 5/15. Improving-afebrile-leukocytosis has almost resolved.  Continue meropenem  started on 05/14/2024.  Moderate right-sided hydronephrosis Suspected extrinsic compression from right adnexal mass-no obvious stone/clots seen on CT imaging. Evaluated by urology on 5/15-cystoscopy/right ureteral stent placed. Urology following.    Hematuria Occurred overnight Foley catheter in place Holding aspirin /Plavix  Await further recommendations from urology.  AKI with hyponatremia AKI is multifactorial at this point-likely due to dehydration (poor oral intake)-UTI/bacteremia and underlying hydronephrosis. S/p cystoscopy-right ureteral stent placement Initiate IV fluids on 05/16/2024 and monitor.  3.9 x 5.3 cm right adnexal lesion Likely causing mass effect and hydronephrosis Discussed with daughter on 5/16-given age/frailty-we have agreed not to pursue any further workup at this point.  She will discuss with rest of the family members and let us  know if she changes her mind.  She understands that further workup will not result in change in management-and will be for diagnostic purposes.  Palliative care consulted for  goals of care, had  long discussion with patient's daughter on 05/17/2024, she confirms DNR, no heroics, continue gentle medical treatment if significant decline then full comfort measures they also have a formal meeting with palliative care on 05/17/2024.Aaron Aas  Normocytic anemia Likely due to AKI/acute illness-no evidence of blood loss Follow CBC  Thrombocytopenia Mild Probably due to gram-negative bacteremia Follow CBC-no evidence of bleeding currently.  Minimally elevated troponin Secondary to demand ischemia-type II non-STEMI Trend is flat-clinically insignificant without any anginal symptoms Furthermore-elderly patient-has ongoing AKI-clearly not a candidate for any advanced/aggressive therapies.  Hypothyroidism Synthroid   History of recent TIA Nonfocal exam Aspirin /Plavix  held secondary to hematuria (per prior DC summary on 05/11/24 -aspirin  Plavix  x 21 days followed by aspirin ) Continue statin.  HTN Blood pressure running high placed on Coreg  along with Imdur and as needed hydralazine   GERD PPI  Debility/deconditioning/dysphagia PT/OT/SLP eval Continue to 1 diet with thin liquids.  Palliative care DNR in place Palliative care to evaluate for long-term goals of care.  Nutrition Status: Nutrition Problem: Severe Malnutrition Etiology: chronic illness (COPD) Signs/Symptoms: severe muscle depletion, severe fat depletion Interventions: MVI, Boost Breeze, Magic cup    Code status:   Code Status: Limited: Do not attempt resuscitation (DNR) -DNR-LIMITED -Do Not Intubate/DNI    DVT Prophylaxis: Place and maintain sequential compression device Start: 05/13/24 1421 Unable to use pharmacological prophylaxis secondary to hematuria.   Family Communication: Daughter-Lori Griffin-[phone]-at bedside on 05/15/24 previous MD, son bedside on 05/16/2024.  Discussed with daughter Avanell Bob on 05/17/2024, DNR, gentle medical treatment, no heroics.   Disposition Plan: Status is: Inpatient Remains inpatient  appropriate because: Severity of illness   Planned Discharge Destination:Home health SNF   Diet: Diet Order             DIET - DYS 1 Room service appropriate? Yes with Assist; Fluid consistency: Thin  Diet effective now                    MEDICATIONS: Scheduled Meds:  alum & mag hydroxide-simeth  30 mL Oral Once   And   lidocaine   15 mL Oral Once   carvedilol   3.125 mg Oral Once   carvedilol   6.25 mg Oral BID WC   Chlorhexidine  Gluconate Cloth  6 each Topical Daily   docusate sodium   200 mg Oral BID   feeding supplement  1 Container Oral TID BM   isosorbide mononitrate  30 mg Oral Daily   levothyroxine   88 mcg Oral Once per day on Monday Tuesday Wednesday Thursday Friday Saturday   And   levothyroxine   44 mcg Oral Every Sunday   multivitamin with minerals  1 tablet Oral Daily   pantoprazole   40 mg Oral BID AC   polyethylene glycol  17 g Oral BID   pravastatin   40 mg Oral Daily   senna-docusate  2 tablet Oral QHS   Continuous Infusions:  lactated ringers  75 mL/hr at 05/17/24 0218   meropenem  (MERREM ) IV 500 mg (05/17/24 0926)   PRN Meds:.acetaminophen  **OR** acetaminophen , bisacodyl , fentaNYL  (SUBLIMAZE ) injection, hydrALAZINE , ipratropium-albuterol , ondansetron  (ZOFRAN ) IV   I have personally reviewed following labs and imaging studies  LABORATORY DATA: CBC: Recent Labs  Lab 05/13/24 0835 05/13/24 0847 05/13/24 0848 05/14/24 0442 05/15/24 0409 05/16/24 0401 05/17/24 0719  WBC 13.7*  --   --  17.3* 11.4* 17.4* 11.9*  NEUTROABS 11.4*  --   --   --   --   --  9.0*  HGB 9.3*   < >  9.9* 8.6* 9.6* 8.4* 8.5*  HCT 28.5*   < > 29.0* 26.4* 29.0* 24.6* 25.5*  MCV 89.6  --   --  88.0 88.1 86.6 88.2  PLT 85*  --   --  79* 94* 140* 125*   < > = values in this interval not displayed.    Basic Metabolic Panel: Recent Labs  Lab 05/13/24 0835 05/13/24 0847 05/13/24 0848 05/14/24 0442 05/15/24 0409 05/16/24 0401 05/17/24 0719  NA 133*   < > 134* 131* 131*  129* 132*  K 3.9   < > 3.8 3.6 4.3 3.7 4.0  CL 101  --  102 101 102 102 104  CO2 20*  --   --  20* 20* 19* 19*  GLUCOSE 107*  --  109* 87 113* 104* 99  BUN 56*  --  49* 55* 55* 61* 50*  CREATININE 3.26*  --  3.20* 3.03* 3.05* 3.03* 2.57*  CALCIUM  8.1*  --   --  7.8* 7.8* 7.7* 7.9*  MG  --   --   --   --   --   --  1.8  PHOS  --   --   --   --   --   --  3.3   < > = values in this interval not displayed.    GFR: Estimated Creatinine Clearance: 10.5 mL/min (A) (by C-G formula based on SCr of 2.57 mg/dL (H)).  Liver Function Tests: Recent Labs  Lab 05/13/24 0835 05/15/24 0409  AST 29 16  ALT 17 13  ALKPHOS 98 80  BILITOT 0.6 0.6  PROT 5.4* 5.5*  ALBUMIN 2.0* 1.9*   No results for input(s): "LIPASE", "AMYLASE" in the last 168 hours. Recent Labs  Lab 05/13/24 0855  AMMONIA 17     Urine analysis:    Component Value Date/Time   COLORURINE AMBER (A) 05/13/2024 1037   APPEARANCEUR TURBID (A) 05/13/2024 1037   LABSPEC 1.016 05/13/2024 1037   PHURINE 5.0 05/13/2024 1037   GLUCOSEU NEGATIVE 05/13/2024 1037   HGBUR MODERATE (A) 05/13/2024 1037   BILIRUBINUR NEGATIVE 05/13/2024 1037   BILIRUBINUR negative 12/14/2023 1454   KETONESUR 5 (A) 05/13/2024 1037   PROTEINUR 100 (A) 05/13/2024 1037   UROBILINOGEN 0.2 12/14/2023 1454   UROBILINOGEN 0.2 08/02/2012 1603   NITRITE POSITIVE (A) 05/13/2024 1037   LEUKOCYTESUR LARGE (A) 05/13/2024 1037    Sepsis Labs: Lactic Acid, Venous    Component Value Date/Time   LATICACIDVEN 1.5 05/13/2024 0849   RADIOLOGY STUDIES/RESULTS: CT FOREARM LEFT WO CONTRAST Result Date: 05/16/2024 CLINICAL DATA:  Mild EXAM: CT OF THE UPPER LEFT EXTREMITY WITHOUT CONTRAST TECHNIQUE: Multidetector CT imaging of the upper left extremity was performed according to the standard protocol. RADIATION DOSE REDUCTION: This exam was performed according to the departmental dose-optimization program which includes automated exposure control, adjustment of the mA  and/or kV according to patient size and/or use of iterative reconstruction technique. COMPARISON:  Left shoulder radiographs 07/11/2010, left elbow radiographs 09/10/2008; bone survey 10/18/2023 FINDINGS: LEFT HUMERUS: Bones/Joint/Cartilage There is metallic susceptibility artifact from left shoulder arthroplasty hardware. Within this limitation, no perihardware lucency is seen to indicate hardware failure or loosening. There is diffuse decreased bone mineralization. The cortices are intact. Moderate medial elbow joint space narrowing and peripheral osteophytosis. Moderate acromioclavicular joint space narrowing with mild subchondral sclerosis and mild peripheral osteophytosis. Ligaments Suboptimally assessed by CT. Muscles and Tendons Mild biceps and brachialis muscle atrophy. No dense collection is seen to suggest a hematoma. Soft  tissues There is mild subcutaneous fat edema and swelling seen throughout the partially visualized left chest wall and the left upper arm. More moderate edema and stranding within the posterior left upper arm soft tissues at the level of the distal humeral diaphysis and more distally to the elbow. No walled-off abscess is seen. Only a tiny portion of the lateral left lung is imaged. There are lateral left upper lobe subpleural densities that are grossly similar to 05/13/2024 recent chest CT and 09/30/2018 CT. This is suggestive of scarring. -- LEFT FOREARM: Bones/Joint/Cartilage Diffuse decreased bone mineralization. Moderate radiocarpal, severe triscaphe, and severe thumb carpometacarpal, and moderate rest of the carpometacarpal and intercarpal joint osteoarthritis including joint space narrowing, subchondral sclerosis, and peripheral osteophytosis. Moderate medial elbow joint space narrowing and peripheral osteophytosis at the trochlea and coronoid process. Ligaments Suboptimally assessed by CT. Muscles and Tendons Mild diffuse muscle atrophy. No dense collection is seen to suggest a  hematoma. Soft tissues Mild to moderate volar and mild dorsal subcutaneous fat soft tissue edema and swelling. Incidental note of mild air within a radial forearm IV needle (axial series 4, image 105). No walled-off abscess is seen. IMPRESSION: 1. Mild subcutaneous fat edema and swelling seen throughout the partially visualized left chest wall and the left upper arm. More moderate edema and stranding within the posterior left upper arm soft tissues at the level of the distal humeral diaphysis and more distally to the elbow. Mild-to-moderate forearm subcutaneous fat edema and swelling. No walled-off abscess is seen. 2. No acute fracture is seen. 3. 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 4. 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 Electronically Signed   By: Bertina Broccoli M.D.   On: 05/16/2024 08:57   CT HUMERUS LEFT WO CONTRAST Result Date: 05/16/2024 CLINICAL DATA:  Mild EXAM: CT OF THE UPPER LEFT EXTREMITY WITHOUT CONTRAST TECHNIQUE: Multidetector CT imaging of the upper left extremity was performed according to the standard protocol. RADIATION DOSE REDUCTION: This exam was performed according to the departmental dose-optimization program which includes automated exposure control, adjustment of the mA and/or kV according to patient size and/or use of iterative reconstruction technique. COMPARISON:  Left shoulder radiographs 07/11/2010, left elbow radiographs 09/10/2008; bone survey 10/18/2023 FINDINGS: LEFT HUMERUS: Bones/Joint/Cartilage There is metallic susceptibility artifact from left shoulder arthroplasty hardware. Within this limitation, no perihardware lucency is seen to indicate hardware failure or loosening. There is diffuse decreased bone mineralization. The cortices are intact. Moderate medial elbow joint space narrowing and peripheral osteophytosis. Moderate acromioclavicular joint space narrowing with mild subchondral sclerosis and mild peripheral osteophytosis.  Ligaments Suboptimally assessed by CT. Muscles and Tendons Mild biceps and brachialis muscle atrophy. No dense collection is seen to suggest a hematoma. Soft tissues There is mild subcutaneous fat edema and swelling seen throughout the partially visualized left chest wall and the left upper arm. More moderate edema and stranding within the posterior left upper arm soft tissues at the level of the distal humeral diaphysis and more distally to the elbow. No walled-off abscess is seen. Only a tiny portion of the lateral left lung is imaged. There are lateral left upper lobe subpleural densities that are grossly similar to 05/13/2024 recent chest CT and 09/30/2018 CT. This is suggestive of scarring. -- LEFT FOREARM: Bones/Joint/Cartilage Diffuse decreased bone mineralization. Moderate radiocarpal, severe triscaphe, and severe thumb carpometacarpal, and moderate rest of the carpometacarpal and intercarpal joint osteoarthritis including joint space narrowing, subchondral sclerosis, and peripheral osteophytosis. Moderate medial elbow joint space narrowing and peripheral osteophytosis at the  trochlea and coronoid process. Ligaments Suboptimally assessed by CT. Muscles and Tendons Mild diffuse muscle atrophy. No dense collection is seen to suggest a hematoma. Soft tissues Mild to moderate volar and mild dorsal subcutaneous fat soft tissue edema and swelling. Incidental note of mild air within a radial forearm IV needle (axial series 4, image 105). No walled-off abscess is seen. IMPRESSION: 1. Mild subcutaneous fat edema and swelling seen throughout the partially visualized left chest wall and the left upper arm. More moderate edema and stranding within the posterior left upper arm soft tissues at the level of the distal humeral diaphysis and more distally to the elbow. Mild-to-moderate forearm subcutaneous fat edema and swelling. No walled-off abscess is seen. 2. No acute fracture is seen. 3. Moderate medial elbow and  moderate acromioclavicular osteoarthritis. 4. 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 Electronically Signed   By: Bertina Broccoli M.D.   On: 05/16/2024 08:57   DG Chest Port 1 View Result Date: 05/16/2024 CLINICAL DATA:  Shortness of breath.  Altered mental status. EXAM: PORTABLE CHEST 1 VIEW COMPARISON:  05/13/2024 FINDINGS: Lungs are hyperexpanded. Cardiopericardial silhouette is at upper limits of normal for size. Interstitial markings are diffusely coarsened with chronic features. Trace atelectasis or scarring noted at the bases. Bones are diffusely demineralized. Status post bilateral shoulder replacement. Telemetry leads overlie the chest. IMPRESSION: Hyperexpansion with chronic interstitial coarsening. No acute cardiopulmonary findings. Electronically Signed   By: Donnal Fusi M.D.   On: 05/16/2024 08:12     LOS: 4 days   Signature  -    Lynnwood Sauer M.D on 05/17/2024 at 9:59 AM   -  To page go to [email or web address]

## 2024-05-17 NOTE — Progress Notes (Addendum)
@   [phone] - Notified Dr. Del Favia of patient's BP reading of 175/84. Patient's BP's have been trending higher as the night has progressed. BP readings in both arms have similar results when compared.   @ [phone] -  Received order for carvedilol . Will give this to patient.

## 2024-05-17 NOTE — Progress Notes (Signed)
 Daily Progress Note   Patient Name: Christine Leblanc       Date: 05/17/2024 DOB: January 26, 1929  Age: 89 y.o. MRN#: [id number] Attending Physician: Cala Castleman, MD Primary Care Physician: Omie Bickers, MD Admit Date: 05/13/2024  Reason for Consultation/Follow-up: Establishing goals of care  Length of Stay: 4  Current Medications: Scheduled Meds:   carvedilol   6.25 mg Oral BID WC   Chlorhexidine  Gluconate Cloth  6 each Topical Daily   docusate sodium   200 mg Oral BID   feeding supplement  1 Container Oral TID BM   isosorbide mononitrate  30 mg Oral Daily   levothyroxine   88 mcg Oral Once per day on Monday Tuesday Wednesday Thursday Friday Saturday   And   levothyroxine   44 mcg Oral Every Sunday   multivitamin with minerals  1 tablet Oral Daily   pantoprazole   40 mg Oral BID AC   polyethylene glycol  17 g Oral BID   pravastatin   40 mg Oral Daily   senna-docusate  2 tablet Oral QHS    Continuous Infusions:  meropenem  (MERREM ) IV 500 mg (05/17/24 0926)    PRN Meds: acetaminophen  **OR** acetaminophen , bisacodyl , fentaNYL  (SUBLIMAZE ) injection, hydrALAZINE , ipratropium-albuterol , ondansetron  (ZOFRAN ) IV  Physical Exam Vitals reviewed.  Constitutional:      General: She is not in acute distress. HENT:     Head: Normocephalic and atraumatic.  Cardiovascular:     Rate and Rhythm: Normal rate.  Pulmonary:     Effort: Pulmonary effort is normal.  Skin:    General: Skin is warm and dry.             Vital Signs: BP (!) 158/75   Pulse 76   Temp 98.1 F (36.7 C) (Oral)   Resp 16   Ht 5\' 1"  (1.549 m)   Wt 56.9 kg   SpO2 97%   BMI 23.70 kg/m  SpO2: SpO2: 97 % O2 Device: O2 Device: Room Air O2 Flow Rate: O2 Flow Rate (L/min): 1 L/min     Patient Active Problem List    Diagnosis Date Noted   Protein-calorie malnutrition, severe 05/14/2024   Syncope 05/13/2024   Stroke-like symptoms 05/09/2024   DNR (do not resuscitate) 05/09/2024   Acute renal failure superimposed on stage 3b chronic kidney disease (HCC) 05/09/2024   Encephalopathy 03/06/2024  Hypoalbuminemia due to protein-calorie malnutrition (HCC) 03/05/2024   Influenza A 03/04/2024   TIA (transient ischemic attack) 01/06/2024   SIRS (systemic inflammatory response syndrome) (HCC) 12/09/2023   CAP (community acquired pneumonia) 12/09/2023   Chronic cough 10/03/2023   Dysphagia 08/15/2023   Protein-calorie malnutrition, mild (HCC) 06/22/2022   Acute bronchitis 06/22/2022   Iron deficiency anemia 06/22/2022   COPD (chronic obstructive pulmonary disease) (HCC) 06/22/2022   Loss of weight 10/09/2021   Pharyngeal dysphagia 10/09/2021   Mild carotid artery disease (HCC) 05/19/2020   History of TIA (transient ischemic attack) 12/25/2018   Generalized weakness 09/30/2018   Hypokalemia    MGUS (monoclonal gammopathy of unknown significance) 10/21/2017   Primary localized osteoarthrosis of right shoulder 06/21/2015   S/P shoulder replacement 06/21/2015   Oropharyngeal dysphagia 11/29/2014   Osteoarthritis of right knee 07/27/2014   Postsurgical hypothyroidism 04/22/2014   Acquired hypothyroidism 10/12/2013   Dehydration 08/02/2012   Syncope and collapse    Anemia, normocytic normochromic 04/26/2012   Gastroesophageal reflux disease    Essential hypertension 12/07/2011   Leg pain, bilateral 04/27/2011   Dyslipidemia 04/27/2011   Other dysphagia 06/07/2010    Palliative Care Assessment & Plan   Patient Profile: 89 y.o. female  with past medical history of COPD, HLD, HTN, chronic dysphagia, GERD admitted on 05/13/2024 after presenting with c/o syncopal episode after standing from toilet.    Upon further evaluation-patient was found to have complicated UTI-ESBL E. coli bacteremia in a setting of  right hydronephrosis due to probable extrinsic compression from a right adnexal mass. Patient was evaluated by urology-underwent cystoscopy and stent placement on 05/14/24.  Today's Discussion: Patient lying in bed sleeping with family at bedside. We discussed the patient's chronic conditions, recent failure to thrive, and new adnexal mass. Educated the family on differences between an aggressive medical intervention path and comfort based path. Many questions answered. Family has questions regarding home hospice options. Transition of care coordinator consulted for assistance in providing these options. Family understands that they have much to consider and want to discuss further with the patient before making decisions.  I shared with family that yesterday the patient seemed very interested in potentially transferring to a comfort based path and reported she was "very tired."  For now, while they are exploring options we will continue treating the treatable.  Discussed the importance of continued conversation with family and the medical providers regarding overall plan of care and treatment options, ensuring decisions are within the context of the patient's values and GOCs.  Recommendations/Plan: Continue DNR/DNI Treat the treatable Patient and family considering options moving forward TOC consult: Family would like information on home hospice options near them Continued PMT support   Code Status:    Code Status Orders  (From admission, onward)           Start     Ordered   05/13/24 1300  Do not attempt resuscitation (DNR)- Limited -Do Not Intubate (DNI)  Continuous       Question Answer Comment  If pulseless and not breathing No CPR or chest compressions.   In Pre-Arrest Conditions (Patient Is Breathing and Has A Pulse) Do not intubate. Provide all appropriate non-invasive medical interventions. Avoid ICU transfer unless indicated or required.   Consent: Discussion documented in EHR  or advanced directives reviewed      05/13/24 1300         Extensive chart review has been completed prior to seeing the patient including labs, vital signs, imaging,  progress/consult notes, orders, medications, and available advance directive documents.  Care plan was discussed with Dr. Zelda Hickman  Time spent: 65 minutes  Thank you for allowing the Palliative Medicine Team to assist in the care of this patient.    Daina Drum, NP  Please contact Palliative Medicine Team phone at [phone] for questions and concerns.

## 2024-05-18 ENCOUNTER — Inpatient Hospital Stay (HOSPITAL_COMMUNITY): Admit: 2024-05-18

## 2024-05-18 DIAGNOSIS — R55 Syncope and collapse: Secondary | ICD-10-CM | POA: Diagnosis not present

## 2024-05-18 LAB — BASIC METABOLIC PANEL WITH GFR
Anion gap: 5 (ref 5–15)
BUN: 46 mg/dL — ABNORMAL HIGH (ref 8–23)
CO2: 22 mmol/L (ref 22–32)
Calcium: 7.9 mg/dL — ABNORMAL LOW (ref 8.9–10.3)
Chloride: 105 mmol/L (ref 98–111)
Creatinine, Ser: 2.43 mg/dL — ABNORMAL HIGH (ref 0.44–1.00)
GFR, Estimated: 18 mL/min — ABNORMAL LOW (ref >60)
Glucose, Bld: 101 mg/dL — ABNORMAL HIGH (ref 70–99)
Potassium: 4 mmol/L (ref 3.5–5.1)
Sodium: 132 mmol/L — ABNORMAL LOW (ref 135–145)

## 2024-05-18 LAB — CBC WITH DIFFERENTIAL/PLATELET
Abs Immature Granulocytes: 0.12 10*3/uL — ABNORMAL HIGH (ref 0.00–0.07)
Basophils Absolute: 0 10*3/uL (ref 0.0–0.1)
Basophils Relative: 0 %
Eosinophils Absolute: 0.1 10*3/uL (ref 0.0–0.5)
Eosinophils Relative: 1 %
HCT: 23.2 % — ABNORMAL LOW (ref 36.0–46.0)
Hemoglobin: 7.7 g/dL — ABNORMAL LOW (ref 12.0–15.0)
Immature Granulocytes: 1 %
Lymphocytes Relative: 10 %
Lymphs Abs: 1.6 10*3/uL (ref 0.7–4.0)
MCH: 29.3 pg (ref 26.0–34.0)
MCHC: 33.2 g/dL (ref 30.0–36.0)
MCV: 88.2 fL (ref 80.0–100.0)
Monocytes Absolute: 1.2 10*3/uL — ABNORMAL HIGH (ref 0.1–1.0)
Monocytes Relative: 8 %
Neutro Abs: 12.5 10*3/uL — ABNORMAL HIGH (ref 1.7–7.7)
Neutrophils Relative %: 80 %
Platelets: 148 10*3/uL — ABNORMAL LOW (ref 150–400)
RBC: 2.63 MIL/uL — ABNORMAL LOW (ref 3.87–5.11)
RDW: 16.4 % — ABNORMAL HIGH (ref 11.5–15.5)
WBC: 15.5 10*3/uL — ABNORMAL HIGH (ref 4.0–10.5)
nRBC: 0 % (ref 0.0–0.2)

## 2024-05-18 LAB — PHOSPHORUS: Phosphorus: 3.2 mg/dL (ref 2.5–4.6)

## 2024-05-18 LAB — BRAIN NATRIURETIC PEPTIDE: B Natriuretic Peptide: 454.1 pg/mL — ABNORMAL HIGH (ref 0.0–100.0)

## 2024-05-18 LAB — PROCALCITONIN: Procalcitonin: 0.31 ng/mL

## 2024-05-18 LAB — MAGNESIUM: Magnesium: 1.8 mg/dL (ref 1.7–2.4)

## 2024-05-18 MED ORDER — GLYCOPYRROLATE 0.2 MG/ML IJ SOLN: 0.2000 mg | INTRAMUSCULAR | Status: DC | PRN

## 2024-05-18 MED ORDER — POLYVINYL ALCOHOL 1.4 % OP SOLN: 1.0000 [drp] | Freq: Four times a day (QID) | OPHTHALMIC | Status: DC | PRN

## 2024-05-18 MED ORDER — CALCIUM CARBONATE ANTACID 500 MG PO CHEW: 1.0000 | CHEWABLE_TABLET | Freq: Two times a day (BID) | ORAL | Status: DC | PRN

## 2024-05-18 MED ORDER — LORAZEPAM 2 MG/ML IJ SOLN: 1.0000 mg | INTRAMUSCULAR | Status: DC | PRN

## 2024-05-18 MED ORDER — BISACODYL 5 MG PO TBEC: 10.0000 mg | DELAYED_RELEASE_TABLET | Freq: Every day | ORAL | Status: DC

## 2024-05-18 MED ORDER — BISACODYL 5 MG PO TBEC
10.0000 mg | DELAYED_RELEASE_TABLET | Freq: Every day | ORAL | Status: DC
  Administered 2024-05-18: 10 mg via ORAL
  Filled 2024-05-18: qty 2

## 2024-05-18 MED ORDER — HALOPERIDOL LACTATE 2 MG/ML PO CONC: 2.0000 mg | Freq: Four times a day (QID) | ORAL | Status: DC | PRN

## 2024-05-18 MED ORDER — LORAZEPAM 1 MG PO TABS: 1.0000 mg | ORAL_TABLET | ORAL | Status: DC | PRN

## 2024-05-18 MED ORDER — ACETAMINOPHEN 10 MG/ML IV SOLN
1000.0000 mg | Freq: Once | INTRAVENOUS | Status: AC
  Administered 2024-05-18: 1000 mg via INTRAVENOUS
  Filled 2024-05-18: qty 100

## 2024-05-18 MED ORDER — HALOPERIDOL LACTATE 5 MG/ML IJ SOLN: 2.0000 mg | INTRAMUSCULAR | Status: DC | PRN

## 2024-05-18 MED ORDER — HYDROMORPHONE HCL 1 MG/ML IJ SOLN: 0.5000 mg | INTRAMUSCULAR | Status: DC | PRN

## 2024-05-18 MED ORDER — DIPHENHYDRAMINE HCL 50 MG/ML IJ SOLN: 25.0000 mg | INTRAMUSCULAR | Status: DC | PRN

## 2024-05-18 MED ORDER — HALOPERIDOL 1 MG PO TABS: 2.0000 mg | ORAL_TABLET | Freq: Four times a day (QID) | ORAL | Status: DC | PRN

## 2024-05-18 MED ORDER — BIOTENE DRY MOUTH MT LIQD: 15.0000 mL | Freq: Two times a day (BID) | OROMUCOSAL | Status: DC

## 2024-05-18 MED ORDER — ONDANSETRON HCL 4 MG/2ML IJ SOLN: 4.0000 mg | Freq: Four times a day (QID) | INTRAMUSCULAR | Status: DC | PRN

## 2024-05-18 MED ORDER — LORAZEPAM 2 MG/ML PO CONC: 1.0000 mg | ORAL | Status: DC | PRN

## 2024-05-18 MED ORDER — ONDANSETRON 4 MG PO TBDP: 4.0000 mg | ORAL_TABLET | Freq: Four times a day (QID) | ORAL | Status: DC | PRN

## 2024-05-18 MED ORDER — GLYCOPYRROLATE 1 MG PO TABS: 1.0000 mg | ORAL_TABLET | ORAL | Status: DC | PRN

## 2024-05-18 MED ORDER — FLUTICASONE PROPIONATE 50 MCG/ACT NA SUSP
2.0000 | Freq: Every day | NASAL | Status: DC
Start: 2024-05-18 — End: 2024-05-19
  Administered 2024-05-18: 2 via NASAL
  Filled 2024-05-18: qty 16

## 2024-05-18 NOTE — Progress Notes (Signed)
 [facility] night cross cover note:   I was notified by the patient's RN that the patient complaining of some abdominal discomfort/dyspepsia, and requesting prn tums. I subsequently added order for prn Tums.     [name], DO Hospitalist

## 2024-05-18 NOTE — TOC Progression Note (Signed)
 Transition of Care [facility]) - Progression Note    Patient Details  Name: Christine Leblanc MRN: [id number] Date of Birth: 07-08-1929  Transition of Care [facility]) CM/SW Contact  Dane Dung, RN Phone Number: 05/18/2024, 2:43 PM  Clinical Narrative:    CM met with the patient and daughter/son by phone at the bedside to discuss home hospice.  The patient/family prefer [facility] home [facility] and will need hospital bed and 3:1 at the home.  Patient is oriented and remains on room air,  indwelling foley catheter in placed that will remain.  Patient requests to remain on thyroid  medication when she is discharged home with hospice - MD aware.  DME at the home includes transfer chair.  I called and spoke with Burrell Casa, CM at [facility] and she received referral and will reach out once deliver of the hospital bed and 3:1 have been delivered to the home.  Patient will likely discharge home tomorrow - and will need ambulance transportation to home when ready and hospital bed have been delivered.        Expected Discharge Plan and Services                                               Social Determinants of Health (SDOH) Interventions SDOH Screenings   Food Insecurity: No Food Insecurity (05/13/2024)  Housing: Low Risk  (05/13/2024)  Transportation Needs: No Transportation Needs (05/13/2024)  Utilities: Not At Risk (05/13/2024)  Social Connections: Socially Isolated (05/13/2024)  Tobacco Use: Medium Risk (05/14/2024)    Readmission Risk Interventions    05/18/2024    2:43 PM 05/14/2024    3:43 PM 05/11/2024   11:01 AM  Readmission Risk Prevention Plan  Transportation Screening Complete Complete Complete  Medication Review Oceanographer) Complete Complete Complete  PCP or Specialist appointment within 3-5 days of discharge Complete Complete   HRI or Home Care Consult Complete Complete Complete  SW Recovery Care/Counseling Consult Complete Complete Complete  Palliative Care  Screening Complete Not Applicable Not Applicable  Skilled Nursing Facility Not Applicable Not Applicable Not Applicable

## 2024-05-18 NOTE — Progress Notes (Signed)
 Occupational Therapy Treatment Patient Details Name: Christine Leblanc MRN: [id number] DOB: 1929/11/16 Today's Date: 05/18/2024   History of present illness Pt is a 89 y/o F admitted on 05/13/24 after presenting with c/o syncopal episode after standing from toilet. Pt is being treated for complicated UTI & moderate R sided hydronephrosis. Pt underwent cystoscopy with retrograde pyelogram & ureteral stent insertion on 05/14/24. PMH: COPD, HLD, HTN, chronic dysphagia, GERD, hospitalization 5/10-5/12 for AKI & TIA   OT comments  Pt requiring increased physical assistance from initial therapy evals w/ decreased OOB activity. Focused session on [facility] transfers w/ recent suppository administration. Pt requiring Mod A for BSC transfers and Total A for LB ADL completion. Son at bedside and hands on to assist. Extended time discussing DME needs, ADL modifications for at home and issued a gait belt w/ plans to further instruct in next sessions.   Pt with dizziness during activity.  BP after BSC transfer: 81/54 BP after transfer back to bed: 101/57      If plan is discharge home, recommend the following:  A lot of help with bathing/dressing/bathroom;Assistance with cooking/housework;Assist for transportation;Help with stairs or ramp for entrance;Direct supervision/assist for medications management;A lot of help with walking and/or transfers   Equipment Recommendations  BSC/3in1;Wheelchair (measurements OT);Wheelchair cushion (measurements OT);Hospital bed    Recommendations for Other Services      Precautions / Restrictions Precautions Precautions: Fall Recall of Precautions/Restrictions: Impaired Precaution/Restrictions Comments: watch orthostatics Restrictions Weight Bearing Restrictions Per Provider Order: No       Mobility Bed Mobility Overal bed mobility: Needs Assistance Bed Mobility: Rolling, Supine to Sit, Sit to Supine Rolling: Mod assist   Supine to sit: Min assist, HOB elevated Sit  to supine: Mod assist   General bed mobility comments: good initiation for EOB, assist to lift trunk and scoot to edge, Mod A for BLE back to bed. Mod A for rolling for bed pad changing    Transfers Overall transfer level: Needs assistance Equipment used: None Transfers: Sit to/from Stand, Bed to chair/wheelchair/BSC Sit to Stand: Mod assist     Step pivot transfers: Mod assist     General transfer comment: to/from [facility]; see ADL section for details     Balance Overall balance assessment: Needs assistance Sitting-balance support: Feet supported, Bilateral upper extremity supported Sitting balance-Leahy Scale: Fair     Standing balance support: Bilateral upper extremity supported, During functional activity, Single extremity supported, Reliant on assistive device for balance Standing balance-Leahy Scale: Poor                             ADL either performed or assessed with clinical judgement   ADL Overall ADL's : Needs assistance/impaired                     Lower Body Dressing: Total assistance;Bed level   Toilet Transfer: Moderate assistance;Stand-pivot;BSC/3in1 Toilet Transfer Details (indicate cue type and reason): Boost to stand and assist to balance to/from [facility]. good initiation Toileting- Clothing Manipulation and Hygiene: Total assistance;Sitting/lateral lean;Sit to/from stand Toileting - Clothing Manipulation Details (indicate cue type and reason): Total A for hygiene in standing, fatigued quickly and completed remainder of hygiene bed level.       General ADL Comments: Son present and engaged. Discused DME needs (BSC, potentially wheelchair as they only have transport chair and hospital bed for caregiver body mechanics), use of gait belt and modifications for ADLs (transferring to/from [facility]  rather than attempting bathroom mobility), monitoring orthostatics at home to decrease fall risk    Extremity/Trunk Assessment Upper Extremity  Assessment Upper Extremity Assessment: Generalized weakness;Right hand dominant;LUE deficits/detail LUE Deficits / Details: L elbow with some swelling and discomfort - nursing aware and elevating UE   Lower Extremity Assessment Lower Extremity Assessment: Defer to PT evaluation        Vision   Vision Assessment?: No apparent visual deficits   Perception     Praxis     Communication Communication Communication: Impaired   Cognition Arousal: Alert Behavior During Therapy: Flat affect Cognition: Cognition impaired     Awareness: Intellectual awareness impaired, Online awareness impaired Memory impairment (select all impairments): Short-term memory, Declarative long-term memory, Working memory Attention impairment (select first level of impairment): Sustained attention, Selective attention Executive functioning impairment (select all impairments): Sequencing, Reasoning, Problem solving OT - Cognition Comments: good initiation of tasks, cues for paciing and sequencing. memory deficits noted but likely baseline. repetitive questions and statements at times                 Following commands: Impaired Following commands impaired: Only follows one step commands consistently, Follows one step commands with increased time      Cueing   Cueing Techniques: Verbal cues, Tactile cues  Exercises      Shoulder Instructions       General Comments Son at bedside    Pertinent Vitals/ Pain       Pain Assessment Pain Assessment: Faces Faces Pain Scale: Hurts little more Pain Location: stomach Pain Descriptors / Indicators: Grimacing, Sore Pain Intervention(s): Monitored during session, Limited activity within patient's tolerance, Repositioned  Home Living                                          Prior Functioning/Environment              Frequency  Min 2X/week        Progress Toward Goals  OT Goals(current goals can now be found in the care  plan section)  Progress towards OT goals: Not progressing toward goals - comment (weaker than on eval)  Acute Rehab OT Goals Patient Stated Goal: family hoping for home, discussing hospice OT Goal Formulation: With patient/family Time For Goal Achievement: 05/29/24 Potential to Achieve Goals: Good ADL Goals Pt Will Perform Grooming: with supervision;with set-up;standing;with caregiver independent in assisting Pt Will Perform Upper Body Bathing: with contact guard assist;with supervision;sitting;with caregiver independent in assisting Pt Will Perform Upper Body Dressing: with contact guard assist;with supervision;sitting;with caregiver independent in assisting Pt Will Transfer to Toilet: with contact guard assist;with supervision;ambulating Pt Will Perform Toileting - Clothing Manipulation and hygiene: with min assist;with contact guard assist;sit to/from stand;sitting/lateral leans;with caregiver independent in assisting  Plan      Co-evaluation                 AM-PAC OT "6 Clicks" Daily Activity     Outcome Measure   Help from another person eating meals?: None Help from another person taking care of personal grooming?: A Little Help from another person toileting, which includes using toliet, bedpan, or urinal?: Total Help from another person bathing (including washing, rinsing, drying)?: A Lot Help from another person to put on and taking off regular upper body clothing?: A Little Help from another person to put on and taking off regular lower body  clothing?: Total 6 Click Score: 14    End of Session Equipment Utilized During Treatment: Gait belt  OT Visit Diagnosis: Muscle weakness (generalized) (M62.81);Other symptoms and signs involving the nervous system (R29.898)   Activity Tolerance Patient tolerated treatment well   Patient Left in bed;with call bell/phone within reach;with bed alarm set;with family/visitor present   Nurse Communication Mobility status         Time: 1610-9604 OT Time Calculation (min): 42 min  Charges: OT General Charges $OT Visit: 1 Visit OT Treatments $Self Care/Home Management : 38-52 mins  Lawrence Pretty, OTR/L Acute Rehab Services Office: [phone]   Shireen Dory 05/18/2024, 11:06 AM

## 2024-05-18 NOTE — Plan of Care (Signed)
 I joined Mrs. Degraff and her children at bedside to continue goals of care conversation.  The patient slept through the majority of the conversation.  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  Her kidney injury is not recovering, while struggling with third spacing of fluid and diffuse anasarca.  She has highly resistant ESBL E. coli UTI and bacteremia with worsening leukocytosis while on antibiotic treatment. The shared decision was made that aggressive measures such as surgery or chemotherapy would not be considered and that all parties would favor focusing on comfort.  The daughter's sole caveat was that she hoped that her mother could continue to receive her thyroid  supplementation, as she did not want to hasten her demise.  Since she will be doing hospice at home, that seems reasonable.  At the end, Christine Leblanc stated that she was tired and it was time to say goodbye.  Urology will sign off at this time. Should there be an acute change, questions, or concerns, please feel free to call.  No charge for this visit.

## 2024-05-18 NOTE — Progress Notes (Signed)
 PROGRESS NOTE        PATIENT DETAILS Name: Christine Leblanc Age: 89 y.o. Sex: female Date of Birth: Aug 15, 1929 Admit Date: 05/13/2024 Admitting Physician Mandy Second, MD ZOX:[id number], Lethia Raveling, MD  Brief Summary: Patient is a 89 y.o.  female with history of COPD, HLD, HTN, chronic dysphagia, GERD who was hospitalized at [facility] from 5/10-5/12-4 AKI and TIA (MRI brain negative for CVA)-presented to the hospitalist service-for syncopal episode (after standing from toilet.).  Upon further evaluation-patient was found to have complicated UTI-ESBL E. coli bacteremia in a setting of right hydronephrosis due to probable extrinsic compression from a right adnexal mass.  Patient was evaluated by urology-underwent cystoscopy and stent placement on 5/15.  Significant events: 5/14>> admit to [facility]  Significant studies: 5/11>> echo: EF 70-75%, grade 1 diastolic dysfunction. 5/14>> CT head: No acute abnormality. 5/14>> CXR: No PNA 5/14>> CT chest/abdomen/pelvis: Mild/moderate right hydronephrosis, 3.9 x 5.3 cm lesion in the right adnexa. 5/14>> renal ultrasound: Mild to moderate right hydronephrosis 5/15>> renal ultrasound: No evidence of DVT bilaterally.  Significant microbiology data: 5/14>> blood culture: E. coli-sensitivity pending 5/14>> urine culture: E. coli-ESBL  Procedures: 5/15>>Cystoscopy with right retrograde pyelogram and right ureteral stent placement   Consults: Urology Palliative care  Subjective:  Patient in bed, appears comfortable, denies any headache, no fever, no chest pain or pressure, no shortness of breath , no abdominal pain. No focal weakness.  Objective: Vitals: Blood pressure 128/64, pulse 72, temperature 98 F (36.7 C), temperature source Oral, resp. rate (!) 21, height 5\' 1"  (1.549 m), weight 57.5 kg, SpO2 94%.   Exam:  Elderly, extremely frail Caucasian female lying in hospital bed, minimally confused, No new F.N deficits,    [place].AT,PERRAL Supple Neck, No JVD,   Symmetrical Chest wall movement, Good air movement bilaterally, CTAB RRR,No Gallops, Rubs or new Murmurs,  +ve B.Sounds, Abd Soft, No tenderness,   Mild left arm swelling and redness, mostly around the site of the blood draw in the cubital fossa, some soft tissue swelling all throughout the body as well, Foley catheter in place   Assessment/Plan:  Syncope Probably orthostatic or micturition syncope (occurred after she used bathroom and stood up) However D-dimer significantly elevated-PE possible-unable to do CTA given kidney function-Dopplers negative for DVT-given lack of hypoxia or shortness of breath-doubt PE at this point.    ESBL E. coli UTI with gram-negative bacteremia In the setting of right-sided hydronephrosis due to presumed extrinsic compression from right adnexal mass-has been switched to meropenem  on 5/15. Improving-afebrile-leukocytosis has almost resolved.  Continue meropenem  started on 05/14/2024 plan on giving her 7-day course here..  Moderate right-sided hydronephrosis Suspected extrinsic compression from right adnexal mass-no obvious stone/clots seen on CT imaging. Evaluated by urology on 5/15-cystoscopy/right ureteral stent placed. Urology following.    Hematuria Occurred overnight Foley catheter in place Holding aspirin /Plavix  Await further recommendations from urology.  AKI with hyponatremia AKI is multifactorial at this point-likely due to dehydration (poor oral intake)-UTI/bacteremia and underlying hydronephrosis. S/p cystoscopy-right ureteral stent placement Initiate IV fluids on 05/16/2024 and monitor.  3.9 x 5.3 cm right adnexal lesion Likely causing mass effect and hydronephrosis Discussed with daughter on 5/16-given age/frailty-we have agreed not to pursue any further workup at this point.  She will discuss with rest of the family members and let us  know if she changes her mind.  She understands  that further workup  will not result in change in management-and will be for diagnostic purposes.  Palliative care consulted for goals of care, had long discussion with patient's daughter on 05/17/2024, she confirms DNR, no heroics, continue gentle medical treatment if significant decline then full comfort measures they also have a formal meeting with palliative care on 05/17/2024.Aaron Aas  Normocytic anemia Likely due to AKI/acute illness-no evidence of blood loss Follow CBC  Thrombocytopenia Mild Probably due to gram-negative bacteremia Follow CBC-no evidence of bleeding currently.  Minimally elevated troponin Secondary to demand ischemia-type II non-STEMI Trend is flat-clinically insignificant without any anginal symptoms Furthermore-elderly patient-has ongoing AKI-clearly not a candidate for any advanced/aggressive therapies.  Hypothyroidism Synthroid   History of recent TIA Nonfocal exam Aspirin /Plavix  held secondary to hematuria (per prior DC summary on 05/11/24 -aspirin  Plavix  x 21 days followed by aspirin ) Continue statin.  HTN Blood pressure running high placed on Coreg  along with Imdur  and as needed hydralazine   GERD PPI  Debility/deconditioning/dysphagia PT/OT/SLP eval Continue to 1 diet with thin liquids.  Palliative care DNR in place Palliative care to evaluate for long-term goals of care.  Nutrition Status: Nutrition Problem: Severe Malnutrition Etiology: chronic illness (COPD) Signs/Symptoms: severe muscle depletion, severe fat depletion Interventions: MVI, Boost Breeze, Magic cup    Code status:   Code Status: Limited: Do not attempt resuscitation (DNR) -DNR-LIMITED -Do Not Intubate/DNI    DVT Prophylaxis: Place and maintain sequential compression device Start: 05/13/24 1421 Unable to use pharmacological prophylaxis secondary to hematuria.   Family Communication: Daughter-Lori Griffin-[phone]-at bedside on 05/15/24 previous MD, son bedside on 05/16/2024.  Discussed with  daughter Avanell Bob on 05/17/2024, DNR  Discussed with both son bedside and daughter Avanell Bob over the phone on 05/18/2024, explained to them clearly that most of her acute issues are likely to improve a little for temporary basis however her long-term prognosis is not good and looks like she is towards the end of her life.  They would like to continue the present treatment for few more days and then evaluate.   Disposition Plan: Status is: Inpatient Remains inpatient appropriate because: Severity of illness   Planned Discharge Destination:Home health SNF   Diet: Diet Order             DIET - DYS 1 Room service appropriate? Yes with Assist; Fluid consistency: Thin  Diet effective now                    MEDICATIONS: Scheduled Meds:  bisacodyl   10 mg Oral Q1200   carvedilol   6.25 mg Oral BID WC   Chlorhexidine  Gluconate Cloth  6 each Topical Daily   docusate sodium   200 mg Oral BID   feeding supplement  1 Container Oral TID BM   fluticasone   2 spray Each Nare Daily   isosorbide  mononitrate  30 mg Oral Daily   levothyroxine   88 mcg Oral Once per day on Monday Tuesday Wednesday Thursday Friday Saturday   And   levothyroxine   44 mcg Oral Every Sunday   multivitamin with minerals  1 tablet Oral Daily   pantoprazole   40 mg Oral BID AC   polyethylene glycol  17 g Oral BID   pravastatin   40 mg Oral Daily   senna-docusate  2 tablet Oral QHS   Continuous Infusions:  meropenem  (MERREM ) IV 500 mg (05/18/24 0843)   PRN Meds:.acetaminophen  **OR** acetaminophen , fentaNYL  (SUBLIMAZE ) injection, hydrALAZINE , ipratropium-albuterol , ondansetron  (ZOFRAN ) IV   I have personally reviewed following labs and imaging studies  LABORATORY  DATA: CBC: Recent Labs  Lab 05/13/24 0835 05/13/24 0847 05/14/24 0442 05/15/24 0409 05/16/24 0401 05/17/24 0719 05/18/24 0420  WBC 13.7*  --  17.3* 11.4* 17.4* 11.9* 15.5*  NEUTROABS 11.4*  --   --   --   --  9.0* 12.5*  HGB 9.3*   < > 8.6* 9.6* 8.4* 8.5*  7.7*  HCT 28.5*   < > 26.4* 29.0* 24.6* 25.5* 23.2*  MCV 89.6  --  88.0 88.1 86.6 88.2 88.2  PLT 85*  --  79* 94* 140* 125* 148*   < > = values in this interval not displayed.    Basic Metabolic Panel: Recent Labs  Lab 05/14/24 0442 05/15/24 0409 05/16/24 0401 05/17/24 0719 05/18/24 0420  NA 131* 131* 129* 132* 132*  K 3.6 4.3 3.7 4.0 4.0  CL 101 102 102 104 105  CO2 20* 20* 19* 19* 22  GLUCOSE 87 113* 104* 99 101*  BUN 55* 55* 61* 50* 46*  CREATININE 3.03* 3.05* 3.03* 2.57* 2.43*  CALCIUM  7.8* 7.8* 7.7* 7.9* 7.9*  MG  --   --   --  1.8 1.8  PHOS  --   --   --  3.3 3.2    GFR: Estimated Creatinine Clearance: 12.1 mL/min (A) (by C-G formula based on SCr of 2.43 mg/dL (H)).  Liver Function Tests: Recent Labs  Lab 05/13/24 0835 05/15/24 0409  AST 29 16  ALT 17 13  ALKPHOS 98 80  BILITOT 0.6 0.6  PROT 5.4* 5.5*  ALBUMIN 2.0* 1.9*   No results for input(s): "LIPASE", "AMYLASE" in the last 168 hours. Recent Labs  Lab 05/13/24 0855  AMMONIA 17     Urine analysis:    Component Value Date/Time   COLORURINE AMBER (A) 05/13/2024 1037   APPEARANCEUR TURBID (A) 05/13/2024 1037   LABSPEC 1.016 05/13/2024 1037   PHURINE 5.0 05/13/2024 1037   GLUCOSEU NEGATIVE 05/13/2024 1037   HGBUR MODERATE (A) 05/13/2024 1037   BILIRUBINUR NEGATIVE 05/13/2024 1037   BILIRUBINUR negative 12/14/2023 1454   KETONESUR 5 (A) 05/13/2024 1037   PROTEINUR 100 (A) 05/13/2024 1037   UROBILINOGEN 0.2 12/14/2023 1454   UROBILINOGEN 0.2 08/02/2012 1603   NITRITE POSITIVE (A) 05/13/2024 1037   LEUKOCYTESUR LARGE (A) 05/13/2024 1037    Sepsis Labs: Lactic Acid, Venous    Component Value Date/Time   LATICACIDVEN 1.5 05/13/2024 0849   RADIOLOGY STUDIES/RESULTS: No results found.    LOS: 5 days   Signature  -    Lynnwood Sauer M.D on 05/18/2024 at 9:07 AM   -  To page go to [email or web address]

## 2024-05-18 NOTE — Progress Notes (Signed)
 Daily Progress Note   Patient Name: Christine Leblanc       Date: 05/18/2024 DOB: May 27, 1929  Age: 89 y.o. MRN#: [id number] Attending Physician: Christine Castleman, MD Primary Care Physician: Christine Bickers, MD Admit Date: 05/13/2024  Reason for Consultation/Follow-up: Establishing goals of care  Length of Stay: 5  Current Medications: Scheduled Meds:   antiseptic oral rinse  15 mL Topical BID   bisacodyl   10 mg Oral Q1200   Chlorhexidine  Gluconate Cloth  6 each Topical Daily   docusate sodium   200 mg Oral BID   feeding supplement  1 Container Oral TID BM   fluticasone   2 spray Each Nare Daily   levothyroxine   88 mcg Oral Once per day on Monday Tuesday Wednesday Thursday Friday Saturday   And   levothyroxine   44 mcg Oral Every Sunday   pantoprazole   40 mg Oral BID AC   polyethylene glycol  17 g Oral BID   senna-docusate  2 tablet Oral QHS    Continuous Infusions:    PRN Meds: acetaminophen  **OR** acetaminophen , diphenhydrAMINE , glycopyrrolate  **OR** glycopyrrolate  **OR** glycopyrrolate , haloperidol  **OR** haloperidol  **OR** haloperidol  lactate, HYDROmorphone  (DILAUDID ) injection, ipratropium-albuterol , LORazepam  **OR** LORazepam  **OR** LORazepam , ondansetron  **OR** ondansetron  (ZOFRAN ) IV, polyvinyl alcohol   Physical Exam Vitals reviewed.  Constitutional:      General: She is sleeping. She is not in acute distress. HENT:     Head: Normocephalic and atraumatic.  Cardiovascular:     Rate and Rhythm: Normal rate.  Pulmonary:     Effort: Pulmonary effort is normal. Tachypnea present.             Vital Signs: BP 128/64   Pulse 72   Temp 98 F (36.7 C) (Oral)   Resp (!) 21   Ht 5\' 1"  (1.549 m)   Wt 57.5 kg   SpO2 94%   BMI 23.95 kg/m  SpO2: SpO2: 94 % O2 Device: O2  Device: Nasal Cannula O2 Flow Rate: O2 Flow Rate (L/min): 1 L/min     Patient Active Problem List   Diagnosis Date Noted   Protein-calorie malnutrition, severe 05/14/2024   Syncope 05/13/2024   Stroke-like symptoms 05/09/2024   DNR (do not resuscitate) 05/09/2024   Acute renal failure superimposed on stage 3b chronic kidney disease (HCC) 05/09/2024   Encephalopathy 03/06/2024   Hypoalbuminemia due  to protein-calorie malnutrition (HCC) 03/05/2024   Influenza A 03/04/2024   TIA (transient ischemic attack) 01/06/2024   SIRS (systemic inflammatory response syndrome) (HCC) 12/09/2023   CAP (community acquired pneumonia) 12/09/2023   Chronic cough 10/03/2023   Dysphagia 08/15/2023   Protein-calorie malnutrition, mild (HCC) 06/22/2022   Acute bronchitis 06/22/2022   Iron deficiency anemia 06/22/2022   COPD (chronic obstructive pulmonary disease) (HCC) 06/22/2022   Loss of weight 10/09/2021   Pharyngeal dysphagia 10/09/2021   Mild carotid artery disease (HCC) 05/19/2020   History of TIA (transient ischemic attack) 12/25/2018   Generalized weakness 09/30/2018   Hypokalemia    MGUS (monoclonal gammopathy of unknown significance) 10/21/2017   Primary localized osteoarthrosis of right shoulder 06/21/2015   S/P shoulder replacement 06/21/2015   Oropharyngeal dysphagia 11/29/2014   Osteoarthritis of right knee 07/27/2014   Postsurgical hypothyroidism 04/22/2014   Acquired hypothyroidism 10/12/2013   Dehydration 08/02/2012   Syncope and collapse    Anemia, normocytic normochromic 04/26/2012   Gastroesophageal reflux disease    Essential hypertension 12/07/2011   Leg pain, bilateral 04/27/2011   Dyslipidemia 04/27/2011   Other dysphagia 06/07/2010    Palliative Care Assessment & Plan   Patient Profile: 89 y.o. female  with past medical history of COPD, HLD, HTN, chronic dysphagia, GERD admitted on 05/13/2024 after presenting with c/o syncopal episode after standing from toilet.     Upon further evaluation-patient was found to have complicated UTI-ESBL E. coli bacteremia in a setting of right hydronephrosis due to probable extrinsic compression from a right adnexal mass. Patient was evaluated by urology-underwent cystoscopy and stent placement on 05/14/24.  Today's Discussion: Reviewed chart and received update from Dr. Zelda Leblanc. Per urology note the family is ready to proceed with comfort measures. Went to patient room to confirm with family. Patient lying in bed sleeping with grandson at bedside. Neither daughter or son at bedside.   1:25 pm: Spoke to patient's daughter Christine Leblanc and son Christine Leblanc by phone. They confirm that after speaking with urology NP they have decided to transition the patient to comfort measures. They would like to start comfort measures now. We discuss comfort measures at length. We discussed that the patient would no longer receive aggressive medical interventions such as continuous vital signs, lab work, radiology testing, or medications not focused on comfort. All care would focus on how the patient is looking and feeling. This would include management of any symptoms that may cause discomfort, pain, shortness of breath, cough, nausea, agitation, anxiety, and/or secretions etc. Symptoms would be managed with medications and other non-pharmacological interventions. Family verbalized understanding and appreciation. They would like to keep patient's synthroid . They would like to take the patient home with home hospice. Notified TOC CM that family would like information on home hospice options.  Questions and concerns were addressed. Emotional support provided. The family was encouraged to call with questions or concerns. PMT will continue to support holistically.   Recommendations/Plan: Continue DNR/DNI Comfort measures Comfort medications per MAR TOC to discuss home hospice options Continued PMT support  Symptom Management: Tylenol  PRN for pain/fever Dilaudid   PRN for pain/air hunger/comfort Robinul  PRN for excessive secretions Ativan  PRN for agitation/anxiety Zofran  PRN for nausea Liquifilm tears PRN for dry eyes Haldol  PRN for agitation/anxiety May have comfort feeding Comfort cart for family Unrestricted visitations in the setting of EOL (per policy) Oxygen PRN 2L or less for comfort. No escalation.   Code Status:    Code Status Orders  (From admission, onward)  Start     Ordered   05/13/24 1300  Do not attempt resuscitation (DNR)- Limited -Do Not Intubate (DNI)  Continuous       Question Answer Comment  If pulseless and not breathing No CPR or chest compressions.   In Pre-Arrest Conditions (Patient Is Breathing and Has A Pulse) Do not intubate. Provide all appropriate non-invasive medical interventions. Avoid ICU transfer unless indicated or required.   Consent: Discussion documented in EHR or advanced directives reviewed      05/13/24 1300         Extensive chart review has been completed prior to seeing the patient including labs, vital signs, imaging, progress/consult notes, orders, medications, and available advance directive documents.  Care plan was discussed with Dr. Zelda Leblanc, bedside RN, CM Moira Andrews  Time spent: 65 minutes  Thank you for allowing the Palliative Medicine Team to assist in the care of this patient.    Daina Drum, NP  Please contact Palliative Medicine Team phone at [phone] for questions and concerns.

## 2024-05-18 NOTE — Plan of Care (Signed)

## 2024-05-19 ENCOUNTER — Other Ambulatory Visit (HOSPITAL_COMMUNITY): Payer: Self-pay

## 2024-05-19 ENCOUNTER — Encounter: Payer: Self-pay | Admitting: Hematology

## 2024-05-19 ENCOUNTER — Encounter (HOSPITAL_BASED_OUTPATIENT_CLINIC_OR_DEPARTMENT_OTHER)

## 2024-05-19 DIAGNOSIS — R55 Syncope and collapse: Secondary | ICD-10-CM | POA: Diagnosis not present

## 2024-05-19 LAB — CULTURE, BLOOD (ROUTINE X 2)

## 2024-05-19 MED ORDER — LORAZEPAM 1 MG PO TABS
1.0000 mg | ORAL_TABLET | Freq: Three times a day (TID) | ORAL | 0 refills | Status: DC | PRN
  Filled 2024-05-19: qty 15, 5d supply, fill #0

## 2024-05-19 MED ORDER — DOCUSATE SODIUM 100 MG PO CAPS
200.0000 mg | ORAL_CAPSULE | Freq: Every day | ORAL | 0 refills | Status: DC
  Filled 2024-05-19: qty 20, 10d supply, fill #0

## 2024-05-19 MED ORDER — BISACODYL 10 MG RE SUPP
10.0000 mg | Freq: Every day | RECTAL | 0 refills | Status: DC | PRN
  Filled 2024-05-19: qty 12, 12d supply, fill #0

## 2024-05-19 MED ORDER — HALOPERIDOL 1 MG PO TABS
2.0000 mg | ORAL_TABLET | Freq: Four times a day (QID) | ORAL | 0 refills | Status: DC | PRN
  Filled 2024-05-19: qty 40, 5d supply, fill #0

## 2024-05-19 MED ORDER — POLYETHYLENE GLYCOL 3350 17 GM/SCOOP PO POWD
17.0000 g | Freq: Every day | ORAL | 0 refills | Status: DC | PRN
  Filled 2024-05-19: qty 238, 14d supply, fill #0

## 2024-05-19 MED ORDER — SIMPLYTHICK EASY MIX PO GEL
ORAL | 0 refills | Status: AC
  Filled 2024-05-19: qty 125, 30d supply, fill #0
  Filled 2024-05-19: qty 6, 30d supply, fill #0

## 2024-05-19 MED ORDER — HYDROMORPHONE HCL 2 MG PO TABS
1.0000 mg | ORAL_TABLET | Freq: Four times a day (QID) | ORAL | 0 refills | Status: AC | PRN
  Filled 2024-05-19: qty 15, 8d supply, fill #0

## 2024-05-19 NOTE — Progress Notes (Signed)
 Patient taken off monitor for comfort care.

## 2024-05-19 NOTE — Discharge Summary (Signed)
 Christine Leblanc WUJ:[id number] DOB: May 18, 1929 DOA: 05/13/2024  PCP: Omie Bickers, MD  Admit date: 05/13/2024  Discharge date: 05/19/2024  Admitted From: Home   Disposition:  Home with Hosipce   Recommendations for Outpatient Follow-up:   Follow up with PCP in 1-2 weeks  PCP Please obtain BMP/CBC, 2 view CXR in 1week,  (see Discharge instructions)    Disposition.  Home with hospice Condition.  Guarded CODE STATUS.  DNR Activity.  With assistance as tolerated, full fall precautions. Diet.  Dysphagia 3 diet with nectar thick liquids with feeding assistance and aspiration precautions. Goal of care.  Comfort.  Diet Order             DIET DYS 3 Room service appropriate? Yes; Fluid consistency: Nectar Thick  Diet effective now                    Chief Complaint  Patient presents with   Altered Mental Status    Pt bib ems from home. Pt's son woke her up this morning, like normal, set pt on toilet where she went unresponsive and called 911. PMH: TIA, UTI     Brief history of present illness from the day of admission and additional interim summary    89 y.o.  female with history of COPD, HLD, HTN, chronic dysphagia, GERD who was hospitalized at [facility] from 5/10-5/12-4 AKI and TIA (MRI brain negative for CVA)-presented to the hospitalist service-for syncopal episode (after standing from toilet.).  Upon further evaluation-patient was found to have complicated UTI-ESBL E. coli bacteremia in a setting of right hydronephrosis due to probable extrinsic compression from a right adnexal mass.  Patient was evaluated by urology-underwent cystoscopy and stent placement on 5/15.   Significant events: 5/14>> admit to [facility]   Significant studies: 5/11>> echo: EF 70-75%, grade 1 diastolic dysfunction. 5/14>> CT head: No acute  abnormality. 5/14>> CXR: No PNA 5/14>> CT chest/abdomen/pelvis: Mild/moderate right hydronephrosis, 3.9 x 5.3 cm lesion in the right adnexa. 5/14>> renal ultrasound: Mild to moderate right hydronephrosis 5/15>> renal ultrasound: No evidence of DVT bilaterally.   Significant microbiology data: 5/14>> blood culture: E. coli-sensitivity pending 5/14>> urine culture: E. coli-ESBL   Procedures: 5/15>>Cystoscopy with right retrograde pyelogram and right ureteral stent placement    Consults: Urology Palliative care                                                                 Hospital Course    Note after discussions between palliative care team, urology team and family, family decided to pursue full comfort measures and home hospice, only comfort related medications will be continued.  Other medical issues addressed earlier this admission are below.     Syncope Probably orthostatic or micturition syncope (occurred after she used bathroom and stood  up) However D-dimer significantly elevated-PE possible-unable to do CTA given kidney function-Dopplers negative for DVT-given lack of hypoxia or shortness of breath-doubt PE at this point.     ESBL E. coli UTI with gram-negative bacteremia In the setting of right-sided hydronephrosis due to presumed extrinsic compression from right adnexal mass-has been switched to meropenem  on 5/15. Improving-afebrile-leukocytosis has almost resolved.  Received antibiotics here now comfort medications   Moderate right-sided hydronephrosis Suspected extrinsic compression from right adnexal mass-no obvious stone/clots seen on CT imaging. Evaluated by urology on 5/15-cystoscopy/right ureteral stent placed. Urology note the patient here   Hematuria Occurred overnight Foley catheter in place Holding aspirin /Plavix  Now on home hospice with comfort medications only.   AKI with hyponatremia AKI is multifactorial at this point-likely due to dehydration (poor  oral intake)-UTI/bacteremia and underlying hydronephrosis. S/p cystoscopy-right ureteral stent placement    3.9 x 5.3 cm right adnexal lesion Likely causing mass effect and hydronephrosis Discussed with daughter on 5/16-given age/frailty-we have agreed not to pursue any further workup at this point.  She will discuss with rest of the family members and let us  know if she changes her mind.  She understands that further workup will not result in change in management-and will be for diagnostic purposes.  Palliative care consulted for goals of care, now full comfort measures being discharged with home hospice, DNR.   Normocytic anemia Likely due to AKI/acute illness-no evidence of blood loss     Thrombocytopenia Mild    Minimally elevated troponin Secondary to demand ischemia-type II non-STEMI Trend is flat-clinically insignificant without any anginal symptoms Furthermore-elderly patient-has ongoing AKI-clearly not a candidate for any advanced/aggressive therapies.   Hypothyroidism Synthroid    History of recent TIA Nonfocal exam Aspirin /Plavix  held secondary to hematuria (per prior DC summary on 05/11/24 -aspirin  Plavix  x 21 days followed by aspirin ) Continue statin.   HTN Comfort medications only  GERD PPI  Discharge diagnosis     Principal Problem:   Syncope Active Problems:   Protein-calorie malnutrition, severe    Discharge instructions    Discharge Instructions     Discharge instructions   Complete by: As directed    Follow-up with Dr. Derrick Fling to plan stent removal/exchange.  [facility], [phone] -   Disposition.  Home with hospice Condition.  Guarded CODE STATUS.  DNR Activity.  With assistance as tolerated, full fall precautions. Diet.  Dysphagia 3 diet with nectar thick liquids with feeding assistance and aspiration precautions. Goal of care.  Comfort.   Increase activity slowly   Complete by: As directed    No wound care    Complete by: As directed        Discharge Medications   Allergies as of 05/19/2024       Reactions   Celecoxib Shortness Of Breath   Dexlansoprazole  Anaphylaxis, Other (See Comments)   abd pain   Famotidine Other (See Comments)   Makes her feel "very bad"        Medication List     STOP taking these medications    aspirin  EC 81 MG tablet   carvedilol  12.5 MG tablet Commonly known as: Coreg    cefadroxil  500 MG capsule Commonly known as: DURICEF   cloNIDine  0.1 MG tablet Commonly known as: CATAPRES    clopidogrel  75 MG tablet Commonly known as: PLAVIX    diazepam  5 MG tablet Commonly known as: VALIUM    ferrous sulfate  325 (65 FE) MG tablet   pravastatin  40 MG tablet Commonly known as: PRAVACHOL   TAKE these medications    albuterol  108 (90 Base) MCG/ACT inhaler Commonly known as: VENTOLIN  HFA Inhale 1-2 puffs into the lungs every 4 (four) hours as needed for wheezing or shortness of breath.   bisacodyl  10 MG suppository Commonly known as: Dulcolax Place 1 suppository (10 mg total) rectally daily as needed for severe constipation.   docusate sodium  100 MG capsule Commonly known as: COLACE Take 2 capsules (200 mg total) by mouth daily.   fluticasone  50 MCG/ACT nasal spray Commonly known as: FLONASE  Place 2 sprays into both nostrils daily.   haloperidol  2 MG tablet Commonly known as: HALDOL  Take 1 tablet (2 mg total) by mouth every 6 (six) hours as needed for agitation (or delirium).   HYDROmorphone  2 MG tablet Commonly known as: Dilaudid  Take 0.5 tablets (1 mg total) by mouth every 6 (six) hours as needed for up to 5 days for severe pain (pain score 7-10).   levothyroxine  88 MCG tablet Commonly known as: SYNTHROID  TAKE 1 TABLET DAILY ON MONDAY THROUGH SATURDAY AND ONE-HALF (1/2) TABLET ON SUNDAY What changed: See the new instructions.   LORazepam  1 MG tablet Commonly known as: Ativan  Take 1 tablet (1 mg total) by mouth every 8 (eight)  hours as needed for anxiety.   pantoprazole  40 MG tablet Commonly known as: PROTONIX  Take 1 tablet (40 mg total) by mouth 2 (two) times daily before a meal.   polyethylene glycol 17 g packet Commonly known as: MIRALAX  / GLYCOLAX  Take 17 g by mouth daily as needed for mild constipation or moderate constipation.   senna 8.6 MG tablet Commonly known as: SENOKOT Take 2 tablets by mouth at bedtime.   sodium chloride  0.9 % nebulizer solution Take 3 mLs by nebulization daily as needed for wheezing.   ThickenUp Clear Powd May take 1 packet by mouth 4 (four) times daily as needed (To make liquids nectar thick). May also take 1 Package 4 (four) times daily as needed (To make liquids nectar thick).         Follow-up Information     Christina Coyer, MD. Call.   Specialty: Urology Contact information: [address] [place] [address] [phone]         [facility] Follow up.   Specialty: Hospice Contact information: [address] [place]  [address] [phone]                Major procedures and Radiology Reports - PLEASE review detailed and final reports thoroughly  -     CT FOREARM LEFT WO CONTRAST Result Date: 05/16/2024 CLINICAL DATA:  Mild EXAM: CT OF THE UPPER LEFT EXTREMITY WITHOUT CONTRAST TECHNIQUE: Multidetector CT imaging of the upper left extremity was performed according to the standard protocol. RADIATION DOSE REDUCTION: This exam was performed according to the departmental dose-optimization program which includes automated exposure control, adjustment of the mA and/or kV according to patient size and/or use of iterative reconstruction technique. COMPARISON:  Left shoulder radiographs 07/11/2010, left elbow radiographs 09/10/2008; bone survey 10/18/2023 FINDINGS: LEFT HUMERUS: Bones/Joint/Cartilage There is metallic susceptibility artifact from left shoulder arthroplasty hardware. Within this limitation, no  perihardware lucency is seen to indicate hardware failure or loosening. There is diffuse decreased bone mineralization. The cortices are intact. Moderate medial elbow joint space narrowing and peripheral osteophytosis. Moderate acromioclavicular joint space narrowing with mild subchondral sclerosis and mild peripheral osteophytosis. Ligaments Suboptimally assessed by CT. Muscles and Tendons Mild biceps and brachialis muscle atrophy. No dense collection is seen  to suggest a hematoma. Soft tissues There is mild subcutaneous fat edema and swelling seen throughout the partially visualized left chest wall and the left upper arm. More moderate edema and stranding within the posterior left upper arm soft tissues at the level of the distal humeral diaphysis and more distally to the elbow. No walled-off abscess is seen. Only a tiny portion of the lateral left lung is imaged. There are lateral left upper lobe subpleural densities that are grossly similar to 05/13/2024 recent chest CT and 09/30/2018 CT. This is suggestive of scarring. -- LEFT FOREARM: Bones/Joint/Cartilage Diffuse decreased bone mineralization. Moderate radiocarpal, severe triscaphe, and severe thumb carpometacarpal, and moderate rest of the carpometacarpal and intercarpal joint osteoarthritis including joint space narrowing, subchondral sclerosis, and peripheral osteophytosis. Moderate medial elbow joint space narrowing and peripheral osteophytosis at the trochlea and coronoid process. Ligaments Suboptimally assessed by CT. Muscles and Tendons Mild diffuse muscle atrophy. No dense collection is seen to suggest a hematoma. Soft tissues Mild to moderate volar and mild dorsal subcutaneous fat soft tissue edema and swelling. Incidental note of mild air within a radial forearm IV needle (axial series 4, image 105). No walled-off abscess is seen. IMPRESSION: 1. Mild subcutaneous fat edema and swelling seen throughout the partially visualized left chest wall and the  left upper arm. More moderate edema and stranding within the posterior left upper arm soft tissues at the level of the distal humeral diaphysis and more distally to the elbow. Mild-to-moderate forearm subcutaneous fat edema and swelling. No walled-off abscess is seen. 2. No acute fracture is seen. 3. 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 4. 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 Electronically Signed   By: Bertina Broccoli M.D.   On: 05/16/2024 08:57   CT HUMERUS LEFT WO CONTRAST Result Date: 05/16/2024 CLINICAL DATA:  Mild EXAM: CT OF THE UPPER LEFT EXTREMITY WITHOUT CONTRAST TECHNIQUE: Multidetector CT imaging of the upper left extremity was performed according to the standard protocol. RADIATION DOSE REDUCTION: This exam was performed according to the departmental dose-optimization program which includes automated exposure control, adjustment of the mA and/or kV according to patient size and/or use of iterative reconstruction technique. COMPARISON:  Left shoulder radiographs 07/11/2010, left elbow radiographs 09/10/2008; bone survey 10/18/2023 FINDINGS: LEFT HUMERUS: Bones/Joint/Cartilage There is metallic susceptibility artifact from left shoulder arthroplasty hardware. Within this limitation, no perihardware lucency is seen to indicate hardware failure or loosening. There is diffuse decreased bone mineralization. The cortices are intact. Moderate medial elbow joint space narrowing and peripheral osteophytosis. Moderate acromioclavicular joint space narrowing with mild subchondral sclerosis and mild peripheral osteophytosis. Ligaments Suboptimally assessed by CT. Muscles and Tendons Mild biceps and brachialis muscle atrophy. No dense collection is seen to suggest a hematoma. Soft tissues There is mild subcutaneous fat edema and swelling seen throughout the partially visualized left chest wall and the left upper arm. More moderate edema and stranding within the posterior left  upper arm soft tissues at the level of the distal humeral diaphysis and more distally to the elbow. No walled-off abscess is seen. Only a tiny portion of the lateral left lung is imaged. There are lateral left upper lobe subpleural densities that are grossly similar to 05/13/2024 recent chest CT and 09/30/2018 CT. This is suggestive of scarring. -- LEFT FOREARM: Bones/Joint/Cartilage Diffuse decreased bone mineralization. Moderate radiocarpal, severe triscaphe, and severe thumb carpometacarpal, and moderate rest of the carpometacarpal and intercarpal joint osteoarthritis including joint space narrowing, subchondral sclerosis, and peripheral osteophytosis. Moderate medial elbow joint space narrowing  and peripheral osteophytosis at the trochlea and coronoid process. Ligaments Suboptimally assessed by CT. Muscles and Tendons Mild diffuse muscle atrophy. No dense collection is seen to suggest a hematoma. Soft tissues Mild to moderate volar and mild dorsal subcutaneous fat soft tissue edema and swelling. Incidental note of mild air within a radial forearm IV needle (axial series 4, image 105). No walled-off abscess is seen. IMPRESSION: 1. Mild subcutaneous fat edema and swelling seen throughout the partially visualized left chest wall and the left upper arm. More moderate edema and stranding within the posterior left upper arm soft tissues at the level of the distal humeral diaphysis and more distally to the elbow. Mild-to-moderate forearm subcutaneous fat edema and swelling. No walled-off abscess is seen. 2. No acute fracture is seen. 3. 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 4. 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 Electronically Signed   By: Bertina Broccoli M.D.   On: 05/16/2024 08:57   DG Chest Port 1 View Result Date: 05/16/2024 CLINICAL DATA:  Shortness of breath.  Altered mental status. EXAM: PORTABLE CHEST 1 VIEW COMPARISON:  05/13/2024 FINDINGS: Lungs are hyperexpanded.  Cardiopericardial silhouette is at upper limits of normal for size. Interstitial markings are diffusely coarsened with chronic features. Trace atelectasis or scarring noted at the bases. Bones are diffusely demineralized. Status post bilateral shoulder replacement. Telemetry leads overlie the chest. IMPRESSION: Hyperexpansion with chronic interstitial coarsening. No acute cardiopulmonary findings. Electronically Signed   By: Donnal Fusi M.D.   On: 05/16/2024 08:12   DG C-Arm 1-60 Min Result Date: 05/14/2024 CLINICAL DATA:  Retrograde cystoscopy EXAM: DG C-ARM 1-60 MIN CONTRAST:  Not listed, refer to operative report FLUOROSCOPY: Fluoroscopy Time:  1 minute 5.8 seconds Radiation Exposure Index (if provided by the fluoroscopic device): 10.140 mGy Number of Acquired Spot Images: 2 COMPARISON:  None Available. FINDINGS: Intraoperative fluoroscopic frontal radiographs of the reported right lower quadrant demonstrate retrograde pyelography demonstrating mild to moderate right hydronephrosis on initial images with subsequent placement of a ureteral stent extending from a upper pole compound calyx beyond the inferior margin of the examination. Bladder excluded from view. No extravasation identified. IMPRESSION: Right ureteral stent placement as described above. Electronically Signed   By: Worthy Heads M.D.   On: 05/14/2024 23:21   VAS US  LOWER EXTREMITY VENOUS (DVT) Result Date: 05/14/2024  Lower Venous DVT Study Patient Name:  LOTUS SANTILLO  Date of Exam:   05/14/2024 Medical Rec #: [id number]       Accession #:    [id number] Date of Birth: 1929-08-19       Patient Gender: F Patient Age:   31 years Exam Location:  [facility] Procedure:      VAS US  LOWER EXTREMITY VENOUS (DVT) Referring Phys: Kimberly Penna --------------------------------------------------------------------------------  Indications: Edema.  Comparison Study: Previous exam on 05/14/2023 & 03/19/2023 were negative for DVT Performing  Technologist: Arlyce Berger RVT, RDMS  Examination Guidelines: A complete evaluation includes B-mode imaging, spectral Doppler, color Doppler, and power Doppler as needed of all accessible portions of each vessel. Bilateral testing is considered an integral part of a complete examination. Limited examinations for reoccurring indications may be performed as noted. The reflux portion of the exam is performed with the patient in reverse Trendelenburg.  +---------+---------------+---------+-----------+---------------+--------------+ RIGHT    CompressibilityPhasicitySpontaneityProperties     Thrombus Aging +---------+---------------+---------+-----------+---------------+--------------+ CFV      Full           Yes      Yes  wall thickening               +---------+---------------+---------+-----------+---------------+--------------+ SFJ      Full                                                             +---------+---------------+---------+-----------+---------------+--------------+ FV Prox  Full           Yes      Yes                                      +---------+---------------+---------+-----------+---------------+--------------+ FV Mid   Full           Yes      Yes                                      +---------+---------------+---------+-----------+---------------+--------------+ FV DistalFull           Yes      Yes                                      +---------+---------------+---------+-----------+---------------+--------------+ PFV                     Yes      Yes                                      +---------+---------------+---------+-----------+---------------+--------------+ POP      Full           Yes      Yes                                      +---------+---------------+---------+-----------+---------------+--------------+ PTV      Full                                                              +---------+---------------+---------+-----------+---------------+--------------+ PERO     Full                                                             +---------+---------------+---------+-----------+---------------+--------------+   +--------+---------------+---------+-----------+------------+----------------+ LEFT    CompressibilityPhasicitySpontaneityProperties  Thrombus Aging   +--------+---------------+---------+-----------+------------+----------------+ CFV     Full           Yes      Yes        wall  thickening                   +--------+---------------+---------+-----------+------------+----------------+ SFJ     Full                                                            +--------+---------------+---------+-----------+------------+----------------+ FV Prox Full           Yes      Yes                                     +--------+---------------+---------+-----------+------------+----------------+ FV Mid  Full           Yes      Yes                                     +--------+---------------+---------+-----------+------------+----------------+ FV      Full           Yes      Yes                                     Distal                                                                  +--------+---------------+---------+-----------+------------+----------------+ PFV     Full                                                            +--------+---------------+---------+-----------+------------+----------------+ POP     Full           Yes      Yes                                     +--------+---------------+---------+-----------+------------+----------------+ PTV     Full                                           Not well                                                                visualized        +--------+---------------+---------+-----------+------------+----------------+ PERO    Full  Not well                                                                visualized       +--------+---------------+---------+-----------+------------+----------------+     Summary: BILATERAL: - No evidence of deep vein thrombosis seen in the lower extremities, bilaterally. -No evidence of popliteal cyst, bilaterally. RIGHT: Subcutaneiys edema noted to lower extremity.   *See table(s) above for measurements and observations. Electronically signed by Delaney Fearing on 05/14/2024 at 6:12:15 PM.    Final    US  RENAL Result Date: 05/13/2024 CLINICAL DATA:  Acute kidney injury EXAM: RENAL / URINARY TRACT ULTRASOUND COMPLETE COMPARISON:  CT scan 05/13/2024 FINDINGS: Right Kidney: Renal measurements: 11.3 by 5.9 by 5.1 cm = volume: 170 a mL. Echogenicity within normal limits. Mild to moderate right hydronephrosis. Left Kidney: Renal measurements: 7.4 by 3.6 by 3.8 cm = volume: 53 mL. Cortical thinning in the left kidney with mild prominence of fatty tissues in renal hilum probably due to thinning of parenchymal tissue. Likely areas of scarring in the left upper to mid kidney for example on image 24 series 1. Moderately hyperechoic left renal parenchyma. No mass or hydronephrosis visualized. Bladder: Bladder not visualized, polyp possibly empty or obscured by bowel. Other: On CT earlier today there is a question of edema tracking along the lesser curvature of the stomach raising the possibility of inflammation, as well as some suspected right gastric adenopathy along the gastrohepatic ligament; these areas are not specifically observed on today's renal ultrasound. Moreover, the right distal ureter did not appear dilated on the earlier CT, with the transition from dilated to nondilated right ureter in the vicinity of the iliac vessel cross over and right ovarian mass. IMPRESSION:  1. Mild to moderate right hydronephrosis. Although the cause is not seen directly on today's renal ultrasound, the transition from dilated to nondilated ureter appear to be in the vicinity of the iliac vessel cross over and right ovarian mass on today's CT. 2. Cortical thinning and volume loss in the left kidney with areas of scarring in the left upper to mid kidney. Moderately hyperechoic left renal parenchyma. 3. On CT earlier today there is a question of edema tracking along the lesser curvature of the stomach raising the possibility of inflammation, as well as some suspected right gastric adenopathy along the gastrohepatic ligament; these areas are not specifically observed on today's renal ultrasound. Electronically Signed   By: Freida Jes M.D.   On: 05/13/2024 14:53   CT CHEST ABDOMEN PELVIS WO CONTRAST Result Date: 05/13/2024 CLINICAL DATA:  AMS, cough, abdominal pain EXAM: CT CHEST, ABDOMEN AND PELVIS WITHOUT CONTRAST TECHNIQUE: Multidetector CT imaging of the chest, abdomen and pelvis was performed following the standard protocol without IV contrast. RADIATION DOSE REDUCTION: This exam was performed according to the departmental dose-optimization program which includes automated exposure control, adjustment of the mA and/or kV according to patient size and/or use of iterative reconstruction technique. COMPARISON:  CT scan chest, abdomen and pelvis from 09/30/2018. FINDINGS: CT CHEST FINDINGS Cardiovascular: Normal cardiac size. No pericardial effusion. No aortic aneurysm. There are coronary artery calcifications, in keeping with coronary artery disease. There are also moderate to severe peripheral atherosclerotic vascular calcifications of thoracic aorta and its major branches. Mediastinum/Nodes: Visualized thyroid   gland appears grossly unremarkable. No solid / cystic mediastinal masses. The esophagus is nondistended precluding optimal assessment. No mediastinal or axillary lymphadenopathy by  size criteria. Evaluation of bilateral hila is limited due to lack on intravenous contrast: however, no large hilar lymphadenopathy identified. Lungs/Pleura: The central tracheo-bronchial tree is patent. Redemonstration of biapical pleuroparenchymal disease, which appears slightly more prominent than the prior exam. For example, a peripheral solid nodule along the right lung apex laterally previously measured 1.3 x 1.9 cm, currently 1.5 x 2.2 cm. There are new bilateral trace pleural effusions with associated atelectatic changes in the bilateral lower lobes. There are several, sub 4 mm, calcified and noncalcified nodules throughout bilateral lungs (marked with electronic arrow sign on series 100). Musculoskeletal: The visualized soft tissues of the chest wall are grossly unremarkable. No suspicious osseous lesions. There are mild to moderate multilevel degenerative changes in the visualized spine. 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 Note is made of bilateral shoulder arthroplasty. CT ABDOMEN PELVIS FINDINGS Hepatobiliary: The liver is normal in size. There is subtle liver surface irregularity/nodularity, favoring cirrhosis. No suspicious mass. No intrahepatic or extrahepatic bile duct dilation. No calcified gallstones. Normal gallbladder wall thickness. No pericholecystic inflammatory changes. Pancreas: Unremarkable. No pancreatic ductal dilatation or surrounding inflammatory changes. Spleen: Within normal limits. No focal lesion. Adrenals/Urinary Tract: Adrenal glands are unremarkable. No suspicious renal mass within the limitations of this unenhanced exam. No nephroureterolithiasis on either side. Note is made of mild-to-moderate right hydronephrosis and upper hydroureter. Rest of the right ureter is not well visualized due to lack of intravenous contrast. No left hydronephrosis. Urinary bladder is under distended, precluding optimal assessment. However, no large mass or stones identified.  No perivesical fat stranding. Stomach/Bowel: No disproportionate dilation of the small or large bowel loops. No evidence of abnormal bowel wall thickening or inflammatory changes. The appendix was not visualized; however there is no acute inflammatory process in the right lower quadrant. Vascular/Lymphatic: There is trace amount of free fluid in the dependent pelvis. No walled-off abscess or loculated collection. No pneumoperitoneum. No abdominal or pelvic lymphadenopathy, by size criteria. No aneurysmal dilation of the major abdominal arteries. There are moderate peripheral atherosclerotic vascular calcifications of the aorta and its major branches. Reproductive: The uterus is surgically absent. There is a well-circumscribed 3.9 x 5.3 cm lesion in the right adnexa, with internal CT attenuation of 30-31 Hounsfield units. This is incompletely characterized on the current examination but suspicious. Further evaluation with contrast-enhanced MRI pelvis versus pelvic ultrasound is recommended. Other: The visualized soft tissues and abdominal wall are unremarkable. Musculoskeletal: No suspicious osseous lesions. There are moderate multilevel degenerative changes in the visualized spine. IMPRESSION: 1. There is mild-to-moderate right hydronephrosis and upper hydroureter. No nephroureterolithiasis on either side. Consider further evaluation with contrast-enhanced study with delayed images. 2. There is a well-circumscribed 3.9 x 5.3 cm lesion in the right adnexa, incompletely characterized on the current examination but suspicious. Further evaluation with contrast-enhanced MRI pelvis versus pelvic ultrasound is recommended. 3. There are new bilateral trace pleural effusions with associated atelectatic changes in the bilateral lower lobes. 4. Multiple other nonacute observations, as described above. Aortic Atherosclerosis (ICD10-I70.0). Electronically Signed   By: Beula Brunswick M.D.   On: 05/13/2024 11:22   DG Chest Port  1 View Result Date: 05/13/2024 CLINICAL DATA:  Altered mental status. Patient became unresponsive on the toilet. EXAM: PORTABLE CHEST 1 VIEW COMPARISON:  Radiographs 05/09/2024 and 04/22/2024.  CT 08/02/2012. FINDINGS: 0948 hours. Patient is rotated  to the left. The heart size and mediastinal contours are stable with aortic atherosclerosis. There are surgical clips at the thoracic inlet. Patchy left basilar opacities unchanged from the recent prior study. The right lung remains clear. No evidence of pneumothorax or significant pleural effusion. No acute osseous findings status post bilateral shoulder arthroplasty. IMPRESSION: Stable examination with mild residual left basilar atelectasis or infiltrate. No new findings. Electronically Signed   By: Elmon Hagedorn M.D.   On: 05/13/2024 10:07   CT HEAD WO CONTRAST Result Date: 05/13/2024 CLINICAL DATA:  Mental status change, unknown cause. EXAM: CT HEAD WITHOUT CONTRAST TECHNIQUE: Contiguous axial images were obtained from the base of the skull through the vertex without intravenous contrast. RADIATION DOSE REDUCTION: This exam was performed according to the departmental dose-optimization program which includes automated exposure control, adjustment of the mA and/or kV according to patient size and/or use of iterative reconstruction technique. COMPARISON:  CT head without contrast 05/09/2024. MR head without contrast 05/09/2024. FINDINGS: Brain: No acute infarct, hemorrhage, or mass lesion is present. Remote lacunar infarct is again noted in the posterior right lentiform nucleus. Chronic ischemic changes are again noted in the thalami bilaterally. Moderate generalized atrophy and diffuse white matter disease is stable. The ventricles are proportionate to the degree of atrophy. No significant extraaxial fluid collection is present. The brainstem and cerebellum are within normal limits. Midline structures are within normal limits. Vascular: Atherosclerotic  calcifications are present within the cavernous internal carotid arteries bilaterally. No hyperdense vessel is present. Skull: Calvarium is intact. No focal lytic or blastic lesions are present. No significant extracranial soft tissue lesion is present. Sinuses/Orbits: The paranasal sinuses and mastoid air cells are clear. Bilateral lens replacements are noted. Globes and orbits are otherwise unremarkable. IMPRESSION: 1. No acute intracranial abnormality or significant interval change. 2. Stable remote lacunar infarct of the posterior right lentiform nucleus. 3. Stable moderate generalized atrophy and diffuse white matter disease. This likely reflects the sequela of chronic microvascular ischemia. Electronically Signed   By: Audree Leas M.D.   On: 05/13/2024 09:02   ECHOCARDIOGRAM COMPLETE Result Date: 05/10/2024    ECHOCARDIOGRAM REPORT   Patient Name:   NARCISSUS DETWILER Date of Exam: 05/10/2024 Medical Rec #:  [id number]      Height:       61.0 in Accession #:    [id number]     Weight:       116.8 lb Date of Birth:  1929-05-08      BSA:          1.503 m Patient Age:    89 years       BP:           151/93 mmHg Patient Gender: F              HR:           104 bpm. Exam Location:  [facility] Procedure: 2D Echo, Cardiac Doppler and Color Doppler (Both Spectral and Color            Flow Doppler were utilized during procedure). Indications:    Stroke l63.9  History:        Patient has prior history of Echocardiogram examinations, most                 recent 01/07/2024. TIA and COPD, Signs/Symptoms:Syncope; Risk                 Factors:Hypertension and Dyslipidemia.  Sonographer:    Denese Finn  RCS Referring Phys: [id number] CARLOS MADERA IMPRESSIONS  1. Left ventricular ejection fraction, by estimation, is 70 to 75%. The left ventricle has hyperdynamic function. The left ventricle has no regional wall motion abnormalities. Left ventricular diastolic parameters are consistent with Grade I diastolic dysfunction  (impaired relaxation).  2. Right ventricular systolic function is hyperdynamic. The right ventricular size is normal.  3. Left atrial size was mildly dilated.  4. The mitral valve is grossly normal. Trivial mitral valve regurgitation. No evidence of mitral stenosis.  5. The aortic valve was not well visualized. Aortic valve regurgitation is not visualized. No aortic stenosis is present. Comparison(s): Prior images reviewed side by side. Function is more vigorous from prior. FINDINGS  Left Ventricle: No strain or 3D transmitted. Left ventricular ejection fraction, by estimation, is 70 to 75%. The left ventricle has hyperdynamic function. The left ventricle has no regional wall motion abnormalities. Strain was performed and the global  longitudinal strain is indeterminate. The left ventricular internal cavity size was normal in size. Suboptimal image quality limits for assessment of left ventricular hypertrophy. Left ventricular diastolic parameters are consistent with Grade I diastolic dysfunction (impaired relaxation). Right Ventricle: The right ventricular size is normal. No increase in right ventricular wall thickness. Right ventricular systolic function is hyperdynamic. Left Atrium: Left atrial size was mildly dilated. Right Atrium: Right atrial size was normal in size. Prominent Eustachian valve. Pericardium: There is no evidence of pericardial effusion. Presence of epicardial fat layer. Mitral Valve: The mitral valve is grossly normal. Trivial mitral valve regurgitation. No evidence of mitral valve stenosis. Tricuspid Valve: The tricuspid valve is normal in structure. Tricuspid valve regurgitation is not demonstrated. No evidence of tricuspid stenosis. Aortic Valve: The aortic valve was not well visualized. Aortic valve regurgitation is not visualized. No aortic stenosis is present. Pulmonic Valve: The pulmonic valve was normal in structure. Pulmonic valve regurgitation is not visualized. No evidence of  pulmonic stenosis. Aorta: The aortic root is normal in size and structure and the ascending aorta was not well visualized. IAS/Shunts: The atrial septum is grossly normal. Additional Comments: 3D was performed not requiring image post processing on an independent workstation and was indeterminate.  LEFT VENTRICLE PLAX 2D LVIDd:         3.60 cm   Diastology LVIDs:         2.00 cm   LV e' medial:    7.18 cm/s LV PW:         1.30 cm   LV E/e' medial:  12.2 LV IVS:        1.30 cm   LV e' lateral:   6.53 cm/s LVOT diam:     1.70 cm   LV E/e' lateral: 13.4 LV SV:         39 LV SV Index:   26 LVOT Area:     2.27 cm  RIGHT VENTRICLE RV S prime:     15.40 cm/s TAPSE (M-mode): 2.1 cm LEFT ATRIUM             Index        RIGHT ATRIUM          Index LA diam:        3.00 cm 2.00 cm/m   RA Area:     9.70 cm LA Vol (A2C):   46.3 ml 30.80 ml/m  RA Volume:   17.20 ml 11.44 ml/m LA Vol (A4C):   47.8 ml 31.80 ml/m LA Biplane Vol: 49.5 ml 32.93 ml/m  AORTIC  VALVE LVOT Vmax:   93.80 cm/s LVOT Vmean:  60.400 cm/s LVOT VTI:    0.171 m  AORTA Ao Root diam: 3.10 cm MITRAL VALVE MV Area (PHT): 4.15 cm     SHUNTS MV Decel Time: 183 msec     Systemic VTI:  0.17 m MR Peak grad: 147.1 mmHg    Systemic Diam: 1.70 cm MR Mean grad: 81.0 mmHg MR Vmax:      606.50 cm/s MR Vmean:     399.5 cm/s MV E velocity: 87.30 cm/s MV A velocity: 116.00 cm/s MV E/A ratio:  0.75 Gloriann Larger MD Electronically signed by Gloriann Larger MD Signature Date/Time: 05/10/2024/11:19:52 AM    Final    US  Carotid Bilateral (at [facility] and [facility] only) Result Date: 05/10/2024 CLINICAL DATA:  Neurologic deficit, hypertension, hyperlipidemia and syncope. EXAM: BILATERAL CAROTID DUPLEX ULTRASOUND TECHNIQUE: Martina Sledge scale imaging, color Doppler and duplex ultrasound were performed of bilateral carotid and vertebral arteries in the neck. COMPARISON:  Prior study on 01/07/2024 FINDINGS: Criteria: Quantification of carotid stenosis is based on velocity parameters that  correlate the residual internal carotid diameter with NASCET-based stenosis levels, using the diameter of the distal internal carotid lumen as the denominator for stenosis measurement. The following velocity measurements were obtained: RIGHT ICA:  78/14 cm/sec CCA:  66/10 cm/sec SYSTOLIC ICA/CCA RATIO:  1.6 ECA:  91 cm/sec LEFT ICA:  79/16 cm/sec CCA:  61/9 cm/sec SYSTOLIC ICA/CCA RATIO:  1.3 ECA:  105 cm/sec RIGHT CAROTID ARTERY: Similar appearance of calcified and noncalcified plaque at the level of the carotid bulb and proximal right ICA. Estimated right ICA stenosis is less than 50%. RIGHT VERTEBRAL ARTERY: Antegrade flow with normal waveform and velocity. LEFT CAROTID ARTERY: Similar appearance of mild calcified plaque at the level of the left carotid bulb and proximal left ICA. Estimated left ICA stenosis is less than 50%. LEFT VERTEBRAL ARTERY: Antegrade flow with normal waveform and velocity. IMPRESSION: Stable appearance of bilateral carotid bulb and proximal ICA plaque, right greater than left. Estimated bilateral ICA stenoses are less than 50%. Electronically Signed   By: Erica Hau M.D.   On: 05/10/2024 10:54   MR BRAIN WO CONTRAST Result Date: 05/09/2024 CLINICAL DATA:  Initial evaluation for acute neuro deficit, stroke suspected. EXAM: MRI HEAD WITHOUT CONTRAST TECHNIQUE: Multiplanar, multiecho pulse sequences of the brain and surrounding structures were obtained without intravenous contrast. COMPARISON:  CT from earlier the same day. FINDINGS: Brain: Examination is limited as the patient was unable to tolerate the full length of the exam. Axial and coronal DWI images only were performed. Diffusion-weighted imaging demonstrates no abnormal foci of restricted diffusion to suggest acute or subacute ischemia. No visible areas of chronic cortical infarction. No visible mass lesion, midline shift, or mass effect. No hydrocephalus. No visible extra-axial fluid collection. Underlying age-related  cerebral atrophy noted. Vascular: Not assessed on this limited exam. Skull and upper cervical spine: Not assessed on this limited exam. Sinuses/Orbits: Not assessed on this limited exam. Other: None. IMPRESSION: 1. Limited exam with only axial and coronal DWI sequences performed. 2. No evidence for acute or subacute ischemia. Electronically Signed   By: Virgia Griffins M.D.   On: 05/09/2024 18:06   DG Chest Port 1 View Result Date: 05/09/2024 CLINICAL DATA:  Shortness of breath. EXAM: PORTABLE CHEST 1 VIEW COMPARISON:  04/22/2024 FINDINGS: New patchy opacity at the left lung base, possible small left pleural effusion. Stable heart size and mediastinal contours. Aortic atherosclerosis. No pulmonary edema or pneumothorax. Bilateral humeral  arthroplasties. IMPRESSION: New patchy opacity at the left lung base, possible small left pleural effusion. Findings are suspicious for pneumonia. Electronically Signed   By: Chadwick Colonel M.D.   On: 05/09/2024 16:34   CT HEAD CODE STROKE WO CONTRAST Result Date: 05/09/2024 CLINICAL DATA:  Code stroke.  Left-sided weakness EXAM: CT HEAD WITHOUT CONTRAST TECHNIQUE: Contiguous axial images were obtained from the base of the skull through the vertex without intravenous contrast. RADIATION DOSE REDUCTION: This exam was performed according to the departmental dose-optimization program which includes automated exposure control, adjustment of the mA and/or kV according to patient size and/or use of iterative reconstruction technique. COMPARISON:  Five days prior FINDINGS: Brain: No evidence of acute infarction, hemorrhage, hydrocephalus, extra-axial collection or mass lesion/mass effect. Low-density in the cerebral white matter attributed to chronic small vessel ischemia. Chronic lacunar infarct or dilated perivascular space at the external capsule/posterior putamen on the right. Vascular: No hyperdense vessel or unexpected calcification. Skull: Normal. Negative for fracture  or focal lesion. Sinuses/Orbits: No acute finding. Other: Intermittent motion artifact. ASPECTS [facility] Stroke Program Early CT Score) - Ganglionic level infarction (caudate, lentiform nuclei, internal capsule, insula, M1-M3 cortex): 7 - Supraganglionic infarction (M4-M6 cortex): 3 Total score (0-10 with 10 being normal): 10 IMPRESSION: 1. Aging brain without acute or interval finding. 2. Intermittent motion artifact. Electronically Signed   By: Ronnette Coke M.D.   On: 05/09/2024 12:43   CT HEAD WO CONTRAST ( ) Result Date: 05/04/2024 CLINICAL DATA:  Headache, increasing frequency or severity. Blood pressure 199/88. EXAM: CT HEAD WITHOUT CONTRAST TECHNIQUE: Contiguous axial images were obtained from the base of the skull through the vertex without intravenous contrast. RADIATION DOSE REDUCTION: This exam was performed according to the departmental dose-optimization program which includes automated exposure control, adjustment of the mA and/or kV according to patient size and/or use of iterative reconstruction technique. COMPARISON:  CT head 04/22/2024 FINDINGS: Brain: No intracranial hemorrhage, mass effect, or evidence of acute infarct. No hydrocephalus. No extra-axial fluid collection. Advanced cerebral atrophy and chronic small vessel ischemic disease. Chronic right basal ganglia infarct. Vascular: No hyperdense vessel. Intracranial arterial calcification. Skull: No fracture or focal lesion. Sinuses/Orbits: No acute finding. Other: None. IMPRESSION: 1. No acute intracranial abnormality. Electronically Signed   By: Rozell Cornet M.D.   On: 05/04/2024 23:41   CT Head Wo Contrast Result Date: 04/22/2024 CLINICAL DATA:  Altered mental status EXAM: CT HEAD WITHOUT CONTRAST TECHNIQUE: Contiguous axial images were obtained from the base of the skull through the vertex without intravenous contrast. RADIATION DOSE REDUCTION: This exam was performed according to the departmental dose-optimization program which  includes automated exposure control, adjustment of the mA and/or kV according to patient size and/or use of iterative reconstruction technique. COMPARISON:  03/04/2024 FINDINGS: Brain: There is no mass, hemorrhage or extra-axial collection. There is generalized atrophy without lobar predilection. Hypodensity of the white matter is most commonly associated with chronic microvascular disease. Old right basal ganglia small vessel infarct. Vascular: Atherosclerotic calcification of the internal carotid arteries at the skull base. No abnormal hyperdensity of the major intracranial arteries or dural venous sinuses. Skull: The visualized skull base, calvarium and extracranial soft tissues are normal. Sinuses/Orbits: No fluid levels or advanced mucosal thickening of the visualized paranasal sinuses. No mastoid or middle ear effusion. Normal orbits. Other: None. IMPRESSION: 1. No acute intracranial abnormality. 2. Generalized atrophy and findings of chronic microvascular disease. 3. Old right basal ganglia small vessel infarct. Electronically Signed   By: Philipp Brawn.D.  On: 04/22/2024 02:17   DG Chest 2 View Result Date: 04/22/2024 CLINICAL DATA:  Altered mental status and elevated blood pressure. EXAM: CHEST - 2 VIEW COMPARISON:  None Available. FINDINGS: The heart size and mediastinal contours are within normal limits. Multiple surgical clips are seen within the superior mediastinum. There is marked severity calcification of the aortic arch. Mild, diffuse, chronic appearing increased interstitial lung markings are noted. Adjacent ill-defined 4 mm nodular opacities are seen overlying the mid right lung. These are not visualized on the prior study. There is no evidence of focal consolidation, pleural effusion or pneumothorax. There is evidence of prior bilateral shoulder arthroplasty. Multilevel degenerative changes are present throughout the thoracic spine. The visualized skeletal structures are unremarkable.  IMPRESSION: Stable chronic and postoperative changes without evidence of acute cardiopulmonary disease. Electronically Signed   By: Virgle Grime M.D.   On: 04/22/2024 02:08    Micro Results    Recent Results (from the past 240 hours)  Blood Culture (Routine X 2)     Status: Abnormal   Collection Time: 05/13/24  8:16 AM   Specimen: BLOOD  Result Value Ref Range Status   Specimen Description BLOOD SITE NOT SPECIFIED  Final   Special Requests   Final    BOTTLES DRAWN AEROBIC AND ANAEROBIC Blood Culture adequate volume   Culture  Setup Time   Final    GRAM NEGATIVE RODS IN BOTH AEROBIC AND ANAEROBIC BOTTLES CRITICAL RESULT CALLED TO, READ BACK BY AND VERIFIED WITH: J .FRENS PHARMD, AT 1012 05/14/24 D. VANHOOK Performed at [facility], [address] [place], [place] [address]    Culture (A)  Final    ESCHERICHIA COLI Confirmed Extended Spectrum Beta-Lactamase Producer (ESBL).  In bloodstream infections from ESBL organisms, carbapenems are preferred over piperacillin/tazobactam. They are shown to have a lower risk of mortality.    Report Status 05/16/2024 FINAL  Final   Organism ID, Bacteria ESCHERICHIA COLI  Final      Susceptibility   Escherichia coli - MIC*    AMPICILLIN >=32 RESISTANT Resistant     CEFEPIME 16 RESISTANT Resistant     CEFTAZIDIME RESISTANT Resistant     CEFTRIAXONE  >=64 RESISTANT Resistant     CIPROFLOXACIN >=4 RESISTANT Resistant     GENTAMICIN >=16 RESISTANT Resistant     IMIPENEM <=0.25 SENSITIVE Sensitive     TRIMETH/SULFA >=320 RESISTANT Resistant     AMPICILLIN/SULBACTAM >=32 RESISTANT Resistant     PIP/TAZO 8 SENSITIVE Sensitive ug/mL    * ESCHERICHIA COLI  Blood Culture ID Panel (Reflexed)     Status: Abnormal   Collection Time: 05/13/24  8:16 AM  Result Value Ref Range Status   Enterococcus faecalis NOT DETECTED NOT DETECTED Final   Enterococcus Faecium NOT DETECTED NOT DETECTED Final   Listeria monocytogenes NOT DETECTED NOT DETECTED  Final   Staphylococcus species NOT DETECTED NOT DETECTED Final   Staphylococcus aureus (BCID) NOT DETECTED NOT DETECTED Final   Staphylococcus epidermidis NOT DETECTED NOT DETECTED Final   Staphylococcus lugdunensis NOT DETECTED NOT DETECTED Final   Streptococcus species NOT DETECTED NOT DETECTED Final   Streptococcus agalactiae NOT DETECTED NOT DETECTED Final   Streptococcus pneumoniae NOT DETECTED NOT DETECTED Final   Streptococcus pyogenes NOT DETECTED NOT DETECTED Final   A.calcoaceticus-baumannii NOT DETECTED NOT DETECTED Final   Bacteroides fragilis NOT DETECTED NOT DETECTED Final   Enterobacterales DETECTED (A) NOT DETECTED Final    Comment: Enterobacterales represent a large order of gram negative bacteria, not a  single organism. CRITICAL RESULT CALLED TO, READ BACK BY AND VERIFIED WITH: J .FRENS PHARMD, AT 1012 05/14/24 D. VANHOOK    Enterobacter cloacae complex NOT DETECTED NOT DETECTED Final   Escherichia coli DETECTED (A) NOT DETECTED Final    Comment: CRITICAL RESULT CALLED TO, READ BACK BY AND VERIFIED WITH: J .FRENS PHARMD, AT 1012 05/14/24 D. VANHOOK    Klebsiella aerogenes NOT DETECTED NOT DETECTED Final   Klebsiella oxytoca NOT DETECTED NOT DETECTED Final   Klebsiella pneumoniae NOT DETECTED NOT DETECTED Final   Proteus species NOT DETECTED NOT DETECTED Final   Salmonella species NOT DETECTED NOT DETECTED Final   Serratia marcescens NOT DETECTED NOT DETECTED Final   Haemophilus influenzae NOT DETECTED NOT DETECTED Final   Neisseria meningitidis NOT DETECTED NOT DETECTED Final   Pseudomonas aeruginosa NOT DETECTED NOT DETECTED Final   Stenotrophomonas maltophilia NOT DETECTED NOT DETECTED Final   Candida albicans NOT DETECTED NOT DETECTED Final   Candida auris NOT DETECTED NOT DETECTED Final   Candida glabrata NOT DETECTED NOT DETECTED Final   Candida krusei NOT DETECTED NOT DETECTED Final   Candida parapsilosis NOT DETECTED NOT DETECTED Final   Candida tropicalis  NOT DETECTED NOT DETECTED Final   Cryptococcus neoformans/gattii NOT DETECTED NOT DETECTED Final   CTX-M ESBL DETECTED (A) NOT DETECTED Final    Comment: CRITICAL RESULT CALLED TO, READ BACK BY AND VERIFIED WITH: J .FRENS PHARMD, AT 1012 05/14/24 D. VANHOOK (NOTE) Extended spectrum beta-lactamase detected. Recommend a carbapenem as initial therapy.      Carbapenem resistance IMP NOT DETECTED NOT DETECTED Final   Carbapenem resistance KPC NOT DETECTED NOT DETECTED Final   Carbapenem resistance NDM NOT DETECTED NOT DETECTED Final   Carbapenem resist OXA 48 LIKE NOT DETECTED NOT DETECTED Final   Carbapenem resistance VIM NOT DETECTED NOT DETECTED Final    Comment: Performed at [facility], [address] [place], [place] [address]  Blood Culture (Routine X 2)     Status: Abnormal   Collection Time: 05/13/24  8:40 AM   Specimen: BLOOD  Result Value Ref Range Status   Specimen Description BLOOD SITE NOT SPECIFIED  Final   Special Requests   Final    BOTTLES DRAWN AEROBIC AND ANAEROBIC Blood Culture results may not be optimal due to an inadequate volume of blood received in culture bottles   Culture  Setup Time   Final    GRAM NEGATIVE RODS ANAEROBIC BOTTLE ONLY CRITICAL VALUE NOTED.  VALUE IS CONSISTENT WITH PREVIOUSLY REPORTED AND CALLED VALUE.    Culture (A)  Final    ESCHERICHIA COLI SUSCEPTIBILITIES PERFORMED ON PREVIOUS CULTURE WITHIN THE LAST 5 DAYS. Performed at [facility], [address] [place], [place] [address]    Report Status 05/19/2024 FINAL  Final  Urine Culture     Status: Abnormal   Collection Time: 05/13/24 10:37 AM   Specimen: Urine, Random  Result Value Ref Range Status   Specimen Description URINE, RANDOM  Final   Special Requests   Final    NONE Reflexed from [id number] Performed at [facility], [address], [place], [place] [address]    Culture (A)  Final    >=100,000 COLONIES/mL ESCHERICHIA COLI Confirmed Extended Spectrum  Beta-Lactamase Producer (ESBL).  In bloodstream infections from ESBL organisms, carbapenems are preferred over piperacillin/tazobactam. They are shown to have a lower risk of mortality.    Report Status 05/15/2024 FINAL  Final   Organism ID, Bacteria ESCHERICHIA COLI (  A)  Final      Susceptibility   Escherichia coli - MIC*    AMPICILLIN >=32 RESISTANT Resistant     CEFAZOLIN  >=64 RESISTANT Resistant     CEFEPIME 16 RESISTANT Resistant     CEFTRIAXONE  >=64 RESISTANT Resistant     CIPROFLOXACIN >=4 RESISTANT Resistant     GENTAMICIN >=16 RESISTANT Resistant     IMIPENEM <=0.25 SENSITIVE Sensitive     NITROFURANTOIN 128 RESISTANT Resistant     TRIMETH/SULFA >=320 RESISTANT Resistant     AMPICILLIN/SULBACTAM >=32 RESISTANT Resistant     PIP/TAZO 8 SENSITIVE Sensitive ug/mL    * >=100,000 COLONIES/mL ESCHERICHIA COLI    Today   Subjective    Allyson Ares today has no headache,no chest abdominal pain,   Objective   Blood pressure (!) 157/74, pulse 73, temperature 97.6 F (36.4 C), temperature source Oral, resp. rate 20, height 5\' 1"  (1.549 m), weight 57.5 kg, SpO2 94%.   Intake/Output Summary (Last 24 hours) at 05/19/2024 0751 Last data filed at 05/19/2024 0300 Gross per 24 hour  Intake --  Output 1100 ml  Net -1100 ml    Exam  Elderly, extremely frail Caucasian female lying in hospital bed, minimally confused, No new F.N deficits,   [place].AT,PERRAL Supple Neck, No JVD,   Symmetrical Chest wall movement, Good air movement bilaterally, CTAB RRR,No Gallops, Rubs or new Murmurs,  +ve B.Sounds, Abd Soft, No tenderness,   Mild left arm swelling and redness, mostly around the site of the blood draw in the cubital fossa, some soft tissue swelling all throughout the body as well, Foley catheter in place    Data Review   Recent Labs  Lab 05/13/24 0835 05/13/24 0847 05/14/24 0442 05/15/24 0409 05/16/24 0401 05/17/24 0719 05/18/24 0420  WBC 13.7*  --  17.3* 11.4* 17.4* 11.9*  15.5*  HGB 9.3*   < > 8.6* 9.6* 8.4* 8.5* 7.7*  HCT 28.5*   < > 26.4* 29.0* 24.6* 25.5* 23.2*  PLT 85*  --  79* 94* 140* 125* 148*  MCV 89.6  --  88.0 88.1 86.6 88.2 88.2  MCH 29.2  --  28.7 29.2 29.6 29.4 29.3  MCHC 32.6  --  32.6 33.1 34.1 33.3 33.2  RDW 16.7*  --  16.9* 16.9* 16.7* 16.6* 16.4*  LYMPHSABS 0.5*  --   --   --   --  1.4 1.6  MONOABS 1.5*  --   --   --   --  1.2* 1.2*  EOSABS 0.1  --   --   --   --  0.1 0.1  BASOSABS 0.0  --   --   --   --  0.0 0.0   < > = values in this interval not displayed.    Recent Labs  Lab 05/13/24 0835 05/13/24 0847 05/13/24 0849 05/13/24 0855 05/14/24 0442 05/15/24 0409 05/16/24 0401 05/17/24 0719 05/18/24 0420  NA 133*   < >  --   --  131* 131* 129* 132* 132*  K 3.9   < >  --   --  3.6 4.3 3.7 4.0 4.0  CL 101   < >  --   --  101 102 102 104 105  CO2 20*  --   --   --  20* 20* 19* 19* 22  ANIONGAP 12  --   --   --  10 9 8 9 5   GLUCOSE 107*   < >  --   --  87  113* 104* 99 101*  BUN 56*   < >  --   --  55* 55* 61* 50* 46*  CREATININE 3.26*   < >  --   --  3.03* 3.05* 3.03* 2.57* 2.43*  AST 29  --   --   --   --  16  --   --   --   ALT 17  --   --   --   --  13  --   --   --   ALKPHOS 98  --   --   --   --  80  --   --   --   BILITOT 0.6  --   --   --   --  0.6  --   --   --   ALBUMIN 2.0*  --   --   --   --  1.9*  --   --   --   DDIMER 7.11*  --   --   --   --   --   --   --   --   PROCALCITON  --   --   --   --   --   --   --  0.52 0.31  LATICACIDVEN  --   --  1.5  --   --   --   --   --   --   TSH 2.923  --   --   --   --   --   --   --   --   AMMONIA  --   --   --  17  --   --   --   --   --   BNP  --   --   --   --   --   --  190.1* 210.2* 454.1*  MG  --   --   --   --   --   --   --  1.8 1.8  PHOS  --   --   --   --   --   --   --  3.3 3.2  CALCIUM  8.1*  --   --   --  7.8* 7.8* 7.7* 7.9* 7.9*   < > = values in this interval not displayed.    Total Time in preparing paper work, data evaluation and todays exam - 35  minutes  Signature  -    Lynnwood Sauer M.D on 05/19/2024 at 7:51 AM   -  To page go to [email or web address]

## 2024-05-19 NOTE — Plan of Care (Signed)

## 2024-05-20 ENCOUNTER — Other Ambulatory Visit (HOSPITAL_COMMUNITY): Payer: Self-pay

## 2024-05-20 DIAGNOSIS — M1990 Unspecified osteoarthritis, unspecified site: Secondary | ICD-10-CM | POA: Diagnosis not present

## 2024-05-20 DIAGNOSIS — J449 Chronic obstructive pulmonary disease, unspecified: Secondary | ICD-10-CM | POA: Diagnosis not present

## 2024-05-20 DIAGNOSIS — D472 Monoclonal gammopathy: Secondary | ICD-10-CM | POA: Diagnosis not present

## 2024-05-20 DIAGNOSIS — E785 Hyperlipidemia, unspecified: Secondary | ICD-10-CM | POA: Diagnosis not present

## 2024-05-20 DIAGNOSIS — K219 Gastro-esophageal reflux disease without esophagitis: Secondary | ICD-10-CM | POA: Diagnosis not present

## 2024-05-20 DIAGNOSIS — G459 Transient cerebral ischemic attack, unspecified: Secondary | ICD-10-CM | POA: Diagnosis not present

## 2024-05-20 DIAGNOSIS — D509 Iron deficiency anemia, unspecified: Secondary | ICD-10-CM | POA: Diagnosis not present

## 2024-05-20 DIAGNOSIS — R531 Weakness: Secondary | ICD-10-CM | POA: Diagnosis not present

## 2024-05-20 DIAGNOSIS — E43 Unspecified severe protein-calorie malnutrition: Secondary | ICD-10-CM | POA: Diagnosis not present

## 2024-05-20 DIAGNOSIS — E8809 Other disorders of plasma-protein metabolism, not elsewhere classified: Secondary | ICD-10-CM | POA: Diagnosis not present

## 2024-05-20 DIAGNOSIS — G4089 Other seizures: Secondary | ICD-10-CM | POA: Diagnosis not present

## 2024-05-20 DIAGNOSIS — I10 Essential (primary) hypertension: Secondary | ICD-10-CM | POA: Diagnosis not present

## 2024-05-20 DIAGNOSIS — E039 Hypothyroidism, unspecified: Secondary | ICD-10-CM | POA: Diagnosis not present

## 2024-05-20 NOTE — Transitions of Care (Post Inpatient/ED Visit) (Signed)
 05/20/2024  Patient ID: Christine Leblanc, female   DOB: [birth year], 89 y.o.   MRN: [id number]  Chart Review for transitions of care.  See [employer] for documentation.  Abundio Teuscher J. Mazey Mantell RN, MSN [facility], [facility] RN Care Manager Direct Dial: [phone]  Fax: [phone] Website: [email or web address]

## 2024-05-21 DIAGNOSIS — K219 Gastro-esophageal reflux disease without esophagitis: Secondary | ICD-10-CM | POA: Diagnosis not present

## 2024-05-21 DIAGNOSIS — E039 Hypothyroidism, unspecified: Secondary | ICD-10-CM | POA: Diagnosis not present

## 2024-05-21 DIAGNOSIS — E785 Hyperlipidemia, unspecified: Secondary | ICD-10-CM | POA: Diagnosis not present

## 2024-05-21 DIAGNOSIS — I10 Essential (primary) hypertension: Secondary | ICD-10-CM | POA: Diagnosis not present

## 2024-05-21 DIAGNOSIS — E43 Unspecified severe protein-calorie malnutrition: Secondary | ICD-10-CM | POA: Diagnosis not present

## 2024-05-21 DIAGNOSIS — G4089 Other seizures: Secondary | ICD-10-CM | POA: Diagnosis not present

## 2024-05-22 DIAGNOSIS — G4089 Other seizures: Secondary | ICD-10-CM | POA: Diagnosis not present

## 2024-05-22 DIAGNOSIS — K219 Gastro-esophageal reflux disease without esophagitis: Secondary | ICD-10-CM | POA: Diagnosis not present

## 2024-05-22 DIAGNOSIS — E43 Unspecified severe protein-calorie malnutrition: Secondary | ICD-10-CM | POA: Diagnosis not present

## 2024-05-22 DIAGNOSIS — I10 Essential (primary) hypertension: Secondary | ICD-10-CM | POA: Diagnosis not present

## 2024-05-22 DIAGNOSIS — E785 Hyperlipidemia, unspecified: Secondary | ICD-10-CM | POA: Diagnosis not present

## 2024-05-22 DIAGNOSIS — E039 Hypothyroidism, unspecified: Secondary | ICD-10-CM | POA: Diagnosis not present

## 2024-05-27 ENCOUNTER — Inpatient Hospital Stay

## 2024-05-27 ENCOUNTER — Telehealth (HOSPITAL_COMMUNITY): Payer: Self-pay

## 2024-05-27 DIAGNOSIS — K219 Gastro-esophageal reflux disease without esophagitis: Secondary | ICD-10-CM | POA: Diagnosis not present

## 2024-05-27 DIAGNOSIS — I10 Essential (primary) hypertension: Secondary | ICD-10-CM | POA: Diagnosis not present

## 2024-05-27 DIAGNOSIS — G4089 Other seizures: Secondary | ICD-10-CM | POA: Diagnosis not present

## 2024-05-27 DIAGNOSIS — E039 Hypothyroidism, unspecified: Secondary | ICD-10-CM | POA: Diagnosis not present

## 2024-05-27 DIAGNOSIS — E785 Hyperlipidemia, unspecified: Secondary | ICD-10-CM | POA: Diagnosis not present

## 2024-05-27 DIAGNOSIS — E43 Unspecified severe protein-calorie malnutrition: Secondary | ICD-10-CM | POA: Diagnosis not present

## 2024-05-27 NOTE — Telephone Encounter (Signed)
 Attempted to contact the patient to schedule VAS US .  Yes answer.  Answered, declined scheduling.  First Attempt. Was unable to provide direct contact number for scheduling: [phone].   Called, daughter answered, listed as accepted contact person, stated that patient condition has changed, and would like to cancel the request for appointment at this time.   I asked about another appointment that was present, daughter states that this appointment was unable to be completed due to provider statement of "she's not swelling so we wont do it"  I processed both requests for cancellation of order.   I wished both the patient and daughter well.

## 2024-05-29 DIAGNOSIS — E785 Hyperlipidemia, unspecified: Secondary | ICD-10-CM | POA: Diagnosis not present

## 2024-05-29 DIAGNOSIS — K219 Gastro-esophageal reflux disease without esophagitis: Secondary | ICD-10-CM | POA: Diagnosis not present

## 2024-05-29 DIAGNOSIS — I10 Essential (primary) hypertension: Secondary | ICD-10-CM | POA: Diagnosis not present

## 2024-05-29 DIAGNOSIS — G4089 Other seizures: Secondary | ICD-10-CM | POA: Diagnosis not present

## 2024-05-29 DIAGNOSIS — E039 Hypothyroidism, unspecified: Secondary | ICD-10-CM | POA: Diagnosis not present

## 2024-05-29 DIAGNOSIS — E43 Unspecified severe protein-calorie malnutrition: Secondary | ICD-10-CM | POA: Diagnosis not present

## 2024-05-31 DIAGNOSIS — G459 Transient cerebral ischemic attack, unspecified: Secondary | ICD-10-CM | POA: Diagnosis not present

## 2024-05-31 DIAGNOSIS — I10 Essential (primary) hypertension: Secondary | ICD-10-CM | POA: Diagnosis not present

## 2024-05-31 DIAGNOSIS — J449 Chronic obstructive pulmonary disease, unspecified: Secondary | ICD-10-CM | POA: Diagnosis not present

## 2024-05-31 DIAGNOSIS — E8809 Other disorders of plasma-protein metabolism, not elsewhere classified: Secondary | ICD-10-CM | POA: Diagnosis not present

## 2024-05-31 DIAGNOSIS — M1990 Unspecified osteoarthritis, unspecified site: Secondary | ICD-10-CM | POA: Diagnosis not present

## 2024-05-31 DIAGNOSIS — E43 Unspecified severe protein-calorie malnutrition: Secondary | ICD-10-CM | POA: Diagnosis not present

## 2024-05-31 DIAGNOSIS — R531 Weakness: Secondary | ICD-10-CM | POA: Diagnosis not present

## 2024-05-31 DIAGNOSIS — D472 Monoclonal gammopathy: Secondary | ICD-10-CM | POA: Diagnosis not present

## 2024-05-31 DIAGNOSIS — G4089 Other seizures: Secondary | ICD-10-CM | POA: Diagnosis not present

## 2024-05-31 DIAGNOSIS — E039 Hypothyroidism, unspecified: Secondary | ICD-10-CM | POA: Diagnosis not present

## 2024-05-31 DIAGNOSIS — D509 Iron deficiency anemia, unspecified: Secondary | ICD-10-CM | POA: Diagnosis not present

## 2024-05-31 DIAGNOSIS — K219 Gastro-esophageal reflux disease without esophagitis: Secondary | ICD-10-CM | POA: Diagnosis not present

## 2024-05-31 DIAGNOSIS — E785 Hyperlipidemia, unspecified: Secondary | ICD-10-CM | POA: Diagnosis not present

## 2024-06-01 ENCOUNTER — Other Ambulatory Visit (HOSPITAL_COMMUNITY): Payer: Self-pay

## 2024-06-01 ENCOUNTER — Other Ambulatory Visit: Payer: Self-pay

## 2024-06-01 ENCOUNTER — Ambulatory Visit: Payer: Medicare Other | Admitting: Internal Medicine

## 2024-06-01 DIAGNOSIS — E785 Hyperlipidemia, unspecified: Secondary | ICD-10-CM | POA: Diagnosis not present

## 2024-06-01 DIAGNOSIS — K219 Gastro-esophageal reflux disease without esophagitis: Secondary | ICD-10-CM | POA: Diagnosis not present

## 2024-06-01 DIAGNOSIS — I10 Essential (primary) hypertension: Secondary | ICD-10-CM | POA: Diagnosis not present

## 2024-06-01 DIAGNOSIS — E43 Unspecified severe protein-calorie malnutrition: Secondary | ICD-10-CM | POA: Diagnosis not present

## 2024-06-01 DIAGNOSIS — E039 Hypothyroidism, unspecified: Secondary | ICD-10-CM | POA: Diagnosis not present

## 2024-06-01 DIAGNOSIS — G4089 Other seizures: Secondary | ICD-10-CM | POA: Diagnosis not present

## 2024-06-02 ENCOUNTER — Other Ambulatory Visit (HOSPITAL_COMMUNITY): Payer: Self-pay

## 2024-06-02 ENCOUNTER — Other Ambulatory Visit: Payer: Self-pay

## 2024-06-03 ENCOUNTER — Inpatient Hospital Stay: Admitting: Physician Assistant

## 2024-06-03 DIAGNOSIS — E785 Hyperlipidemia, unspecified: Secondary | ICD-10-CM | POA: Diagnosis not present

## 2024-06-03 DIAGNOSIS — K219 Gastro-esophageal reflux disease without esophagitis: Secondary | ICD-10-CM | POA: Diagnosis not present

## 2024-06-03 DIAGNOSIS — G4089 Other seizures: Secondary | ICD-10-CM | POA: Diagnosis not present

## 2024-06-03 DIAGNOSIS — E43 Unspecified severe protein-calorie malnutrition: Secondary | ICD-10-CM | POA: Diagnosis not present

## 2024-06-03 DIAGNOSIS — E039 Hypothyroidism, unspecified: Secondary | ICD-10-CM | POA: Diagnosis not present

## 2024-06-03 DIAGNOSIS — I10 Essential (primary) hypertension: Secondary | ICD-10-CM | POA: Diagnosis not present

## 2024-06-05 DIAGNOSIS — I10 Essential (primary) hypertension: Secondary | ICD-10-CM | POA: Diagnosis not present

## 2024-06-05 DIAGNOSIS — K219 Gastro-esophageal reflux disease without esophagitis: Secondary | ICD-10-CM | POA: Diagnosis not present

## 2024-06-05 DIAGNOSIS — G4089 Other seizures: Secondary | ICD-10-CM | POA: Diagnosis not present

## 2024-06-05 DIAGNOSIS — E785 Hyperlipidemia, unspecified: Secondary | ICD-10-CM | POA: Diagnosis not present

## 2024-06-05 DIAGNOSIS — E43 Unspecified severe protein-calorie malnutrition: Secondary | ICD-10-CM | POA: Diagnosis not present

## 2024-06-05 DIAGNOSIS — E039 Hypothyroidism, unspecified: Secondary | ICD-10-CM | POA: Diagnosis not present

## 2024-06-08 DIAGNOSIS — K219 Gastro-esophageal reflux disease without esophagitis: Secondary | ICD-10-CM | POA: Diagnosis not present

## 2024-06-08 DIAGNOSIS — E039 Hypothyroidism, unspecified: Secondary | ICD-10-CM | POA: Diagnosis not present

## 2024-06-08 DIAGNOSIS — G4089 Other seizures: Secondary | ICD-10-CM | POA: Diagnosis not present

## 2024-06-08 DIAGNOSIS — I10 Essential (primary) hypertension: Secondary | ICD-10-CM | POA: Diagnosis not present

## 2024-06-08 DIAGNOSIS — E43 Unspecified severe protein-calorie malnutrition: Secondary | ICD-10-CM | POA: Diagnosis not present

## 2024-06-08 DIAGNOSIS — E785 Hyperlipidemia, unspecified: Secondary | ICD-10-CM | POA: Diagnosis not present

## 2024-06-09 DIAGNOSIS — I10 Essential (primary) hypertension: Secondary | ICD-10-CM | POA: Diagnosis not present

## 2024-06-09 DIAGNOSIS — E039 Hypothyroidism, unspecified: Secondary | ICD-10-CM | POA: Diagnosis not present

## 2024-06-09 DIAGNOSIS — K219 Gastro-esophageal reflux disease without esophagitis: Secondary | ICD-10-CM | POA: Diagnosis not present

## 2024-06-09 DIAGNOSIS — G4089 Other seizures: Secondary | ICD-10-CM | POA: Diagnosis not present

## 2024-06-09 DIAGNOSIS — E785 Hyperlipidemia, unspecified: Secondary | ICD-10-CM | POA: Diagnosis not present

## 2024-06-09 DIAGNOSIS — E43 Unspecified severe protein-calorie malnutrition: Secondary | ICD-10-CM | POA: Diagnosis not present

## 2024-06-10 DIAGNOSIS — E43 Unspecified severe protein-calorie malnutrition: Secondary | ICD-10-CM | POA: Diagnosis not present

## 2024-06-10 DIAGNOSIS — E039 Hypothyroidism, unspecified: Secondary | ICD-10-CM | POA: Diagnosis not present

## 2024-06-10 DIAGNOSIS — E785 Hyperlipidemia, unspecified: Secondary | ICD-10-CM | POA: Diagnosis not present

## 2024-06-10 DIAGNOSIS — K219 Gastro-esophageal reflux disease without esophagitis: Secondary | ICD-10-CM | POA: Diagnosis not present

## 2024-06-10 DIAGNOSIS — G4089 Other seizures: Secondary | ICD-10-CM | POA: Diagnosis not present

## 2024-06-10 DIAGNOSIS — I10 Essential (primary) hypertension: Secondary | ICD-10-CM | POA: Diagnosis not present

## 2024-06-11 DIAGNOSIS — E039 Hypothyroidism, unspecified: Secondary | ICD-10-CM | POA: Diagnosis not present

## 2024-06-11 DIAGNOSIS — I10 Essential (primary) hypertension: Secondary | ICD-10-CM | POA: Diagnosis not present

## 2024-06-11 DIAGNOSIS — E43 Unspecified severe protein-calorie malnutrition: Secondary | ICD-10-CM | POA: Diagnosis not present

## 2024-06-11 DIAGNOSIS — E785 Hyperlipidemia, unspecified: Secondary | ICD-10-CM | POA: Diagnosis not present

## 2024-06-11 DIAGNOSIS — G4089 Other seizures: Secondary | ICD-10-CM | POA: Diagnosis not present

## 2024-06-11 DIAGNOSIS — K219 Gastro-esophageal reflux disease without esophagitis: Secondary | ICD-10-CM | POA: Diagnosis not present

## 2024-06-30 DEATH — deceased

## 2024-07-15 ENCOUNTER — Ambulatory Visit: Admitting: Gastroenterology

## 2024-07-20 ENCOUNTER — Encounter: Payer: Self-pay | Admitting: Hematology

## 2024-07-27 ENCOUNTER — Ambulatory Visit: Admitting: Cardiology

## 2024-07-28 ENCOUNTER — Ambulatory Visit: Admitting: Cardiology
